# Patient Record
Sex: Male | Born: 1970 | Race: White | Hispanic: No | Smoking: Never smoker
Health system: Southern US, Community
[De-identification: ages and names within clinical notes are randomized; demographics above are authoritative.]

## PROBLEM LIST (undated history)

## (undated) DIAGNOSIS — M549 Dorsalgia, unspecified: Secondary | ICD-10-CM

## (undated) DIAGNOSIS — K4090 Unilateral inguinal hernia, without obstruction or gangrene, not specified as recurrent: Secondary | ICD-10-CM

## (undated) DIAGNOSIS — M779 Enthesopathy, unspecified: Secondary | ICD-10-CM

## (undated) DIAGNOSIS — K649 Unspecified hemorrhoids: Secondary | ICD-10-CM

## (undated) DIAGNOSIS — I10 Essential (primary) hypertension: Secondary | ICD-10-CM

## (undated) DIAGNOSIS — E785 Hyperlipidemia, unspecified: Secondary | ICD-10-CM

## (undated) DIAGNOSIS — Z87898 Personal history of other specified conditions: Secondary | ICD-10-CM

## (undated) DIAGNOSIS — I69398 Other sequelae of cerebral infarction: Secondary | ICD-10-CM

## (undated) DIAGNOSIS — M1990 Unspecified osteoarthritis, unspecified site: Secondary | ICD-10-CM

## (undated) DIAGNOSIS — K219 Gastro-esophageal reflux disease without esophagitis: Secondary | ICD-10-CM

## (undated) DIAGNOSIS — F09 Unspecified mental disorder due to known physiological condition: Secondary | ICD-10-CM

## (undated) DIAGNOSIS — G4700 Insomnia, unspecified: Secondary | ICD-10-CM

## (undated) DIAGNOSIS — G894 Chronic pain syndrome: Secondary | ICD-10-CM

## (undated) DIAGNOSIS — N4340 Spermatocele of epididymis, unspecified: Secondary | ICD-10-CM

## (undated) DIAGNOSIS — T7840XA Allergy, unspecified, initial encounter: Secondary | ICD-10-CM

## (undated) DIAGNOSIS — R252 Cramp and spasm: Secondary | ICD-10-CM

## (undated) DIAGNOSIS — I69320 Aphasia following cerebral infarction: Secondary | ICD-10-CM

## (undated) DIAGNOSIS — F410 Panic disorder [episodic paroxysmal anxiety] without agoraphobia: Secondary | ICD-10-CM

## (undated) DIAGNOSIS — N433 Hydrocele, unspecified: Secondary | ICD-10-CM

## (undated) DIAGNOSIS — M48062 Spinal stenosis, lumbar region with neurogenic claudication: Secondary | ICD-10-CM

## (undated) DIAGNOSIS — N529 Male erectile dysfunction, unspecified: Secondary | ICD-10-CM

## (undated) DIAGNOSIS — IMO0002 Reserved for concepts with insufficient information to code with codable children: Secondary | ICD-10-CM

## (undated) DIAGNOSIS — R69 Illness, unspecified: Secondary | ICD-10-CM

## (undated) DIAGNOSIS — F419 Anxiety disorder, unspecified: Secondary | ICD-10-CM

## (undated) DIAGNOSIS — I700 Atherosclerosis of aorta: Secondary | ICD-10-CM

## (undated) HISTORY — DX: Panic disorder (episodic paroxysmal anxiety): F41.0

## (undated) HISTORY — DX: Dorsalgia, unspecified: M54.9

## (undated) HISTORY — DX: Allergy, unspecified, initial encounter: T78.40XA

## (undated) HISTORY — DX: Illness, unspecified: R69

## (undated) HISTORY — PX: CYSTECTOMY: SUR359

## (undated) HISTORY — PX: HERNIA REPAIR: SHX51

## (undated) HISTORY — DX: Reserved for concepts with insufficient information to code with codable children: IMO0002

## (undated) HISTORY — DX: Enthesopathy, unspecified: M77.9

## (undated) HISTORY — DX: Unspecified osteoarthritis, unspecified site: M19.90

---

## 2004-03-18 ENCOUNTER — Emergency Department: Payer: Self-pay | Admitting: Emergency Medicine

## 2004-03-20 ENCOUNTER — Emergency Department: Payer: Self-pay | Admitting: Emergency Medicine

## 2005-03-01 ENCOUNTER — Ambulatory Visit: Payer: Self-pay | Admitting: Podiatry

## 2005-04-22 HISTORY — PX: TOE SURGERY: SHX1073

## 2008-09-01 ENCOUNTER — Emergency Department: Payer: Self-pay | Admitting: Emergency Medicine

## 2008-12-29 ENCOUNTER — Emergency Department: Payer: Self-pay | Admitting: Emergency Medicine

## 2010-06-04 ENCOUNTER — Emergency Department: Payer: Self-pay | Admitting: Emergency Medicine

## 2011-03-25 ENCOUNTER — Ambulatory Visit: Payer: Self-pay | Admitting: Family Medicine

## 2012-01-24 ENCOUNTER — Emergency Department (HOSPITAL_COMMUNITY): Payer: Self-pay

## 2012-01-24 ENCOUNTER — Emergency Department (HOSPITAL_COMMUNITY)
Admission: EM | Admit: 2012-01-24 | Discharge: 2012-01-24 | Disposition: A | Discharge status: Home / Self Care | Payer: Self-pay | Attending: Emergency Medicine | Admitting: Emergency Medicine

## 2012-01-24 DIAGNOSIS — Y998 Other external cause status: Secondary | ICD-10-CM | POA: Insufficient documentation

## 2012-01-24 DIAGNOSIS — Y93I9 Activity, other involving external motion: Secondary | ICD-10-CM | POA: Insufficient documentation

## 2012-01-24 DIAGNOSIS — S2249XA Multiple fractures of ribs, unspecified side, initial encounter for closed fracture: Secondary | ICD-10-CM | POA: Insufficient documentation

## 2012-01-24 MED ORDER — ACETAMINOPHEN 325 MG PO TABS
650.0000 mg | ORAL_TABLET | Freq: Once | ORAL | Status: AC
  Administered 2012-01-24: 650 mg via ORAL
  Filled 2012-01-24: qty 2

## 2012-01-24 MED ORDER — HYDROCODONE-ACETAMINOPHEN 5-325 MG PO TABS: 1.0000 | ORAL_TABLET | Freq: Four times a day (QID) | ORAL | Status: DC | PRN
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
## 2012-01-24 NOTE — ED Notes (Signed)
Incentive spirometer given
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
## 2012-01-24 NOTE — ED Notes (Signed)
Patient transported to X-ray 

## 2012-04-22 DIAGNOSIS — M549 Dorsalgia, unspecified: Secondary | ICD-10-CM

## 2012-04-22 DIAGNOSIS — IMO0002 Reserved for concepts with insufficient information to code with codable children: Secondary | ICD-10-CM

## 2012-04-22 HISTORY — DX: Reserved for concepts with insufficient information to code with codable children: IMO0002

## 2012-04-22 HISTORY — DX: Dorsalgia, unspecified: M54.9

## 2012-05-12 ENCOUNTER — Ambulatory Visit: Payer: Self-pay | Admitting: Family Medicine

## 2012-05-12 LAB — LIPID PANEL
Cholesterol: 233 mg/dL — AB (ref 0–200)
HDL: 37 mg/dL (ref 35–70)
LDL Cholesterol: 169 mg/dL
Triglycerides: 85 mg/dL (ref 40–160)

## 2012-05-12 LAB — TSH: TSH: 1.76 u[IU]/mL (ref <=5.90)

## 2012-07-06 ENCOUNTER — Other Ambulatory Visit: Payer: Self-pay | Admitting: Orthopedic Surgery

## 2012-07-15 NOTE — Progress Notes (Signed)
Sarah at dr Sheran Fava office is aware we have not talked to him yet-not returning calls

## 2012-07-16 ENCOUNTER — Ambulatory Visit (HOSPITAL_BASED_OUTPATIENT_CLINIC_OR_DEPARTMENT_OTHER): Admission: RE | Admit: 2012-07-16 | Payer: 59 | Source: Ambulatory Visit | Admitting: Orthopedic Surgery

## 2012-07-16 ENCOUNTER — Encounter (HOSPITAL_BASED_OUTPATIENT_CLINIC_OR_DEPARTMENT_OTHER): Payer: Self-pay | Admitting: Certified Registered Nurse Anesthetist

## 2012-07-16 ENCOUNTER — Encounter (HOSPITAL_BASED_OUTPATIENT_CLINIC_OR_DEPARTMENT_OTHER): Admission: RE | Payer: Self-pay | Source: Ambulatory Visit

## 2012-07-16 SURGERY — EXCISION METACARPAL MASS
Anesthesia: Choice | Site: Finger | Laterality: Right

## 2012-07-16 NOTE — Anesthesia Preprocedure Evaluation (Deleted)
Anesthesia Evaluation  Patient identified by MRN, date of birth, ID band Patient awake    Reviewed: Allergy & Precautions  Airway       Dental   Pulmonary          Cardiovascular     Neuro/Psych    GI/Hepatic   Endo/Other    Renal/GU      Musculoskeletal   Abdominal   Peds  Hematology   Anesthesia Other Findings   Reproductive/Obstetrics                           Anesthesia Physical Anesthesia Plan Anesthesia Quick Evaluation

## 2012-08-19 ENCOUNTER — Other Ambulatory Visit (HOSPITAL_COMMUNITY): Payer: Self-pay | Admitting: Rheumatology

## 2012-08-19 DIAGNOSIS — M549 Dorsalgia, unspecified: Secondary | ICD-10-CM

## 2012-08-24 ENCOUNTER — Ambulatory Visit (HOSPITAL_COMMUNITY): Payer: 59

## 2012-08-24 ENCOUNTER — Inpatient Hospital Stay (HOSPITAL_COMMUNITY): Admission: RE | Admit: 2012-08-24 | Payer: 59 | Source: Ambulatory Visit

## 2012-08-24 ENCOUNTER — Other Ambulatory Visit (HOSPITAL_COMMUNITY): Payer: 59

## 2012-08-24 ENCOUNTER — Ambulatory Visit (HOSPITAL_COMMUNITY)
Admission: RE | Admit: 2012-08-24 | Discharge: 2012-08-24 | Disposition: A | Discharge status: Home / Self Care | Payer: 59 | Source: Ambulatory Visit | Attending: Rheumatology | Admitting: Rheumatology

## 2012-08-24 ENCOUNTER — Other Ambulatory Visit (HOSPITAL_COMMUNITY): Payer: Self-pay | Admitting: Rheumatology

## 2012-08-24 DIAGNOSIS — M5124 Other intervertebral disc displacement, thoracic region: Secondary | ICD-10-CM | POA: Insufficient documentation

## 2012-08-24 DIAGNOSIS — M549 Dorsalgia, unspecified: Secondary | ICD-10-CM

## 2012-08-24 DIAGNOSIS — M5126 Other intervertebral disc displacement, lumbar region: Secondary | ICD-10-CM | POA: Insufficient documentation

## 2012-08-24 DIAGNOSIS — M51379 Other intervertebral disc degeneration, lumbosacral region without mention of lumbar back pain or lower extremity pain: Secondary | ICD-10-CM | POA: Insufficient documentation

## 2012-08-24 DIAGNOSIS — M5137 Other intervertebral disc degeneration, lumbosacral region: Secondary | ICD-10-CM | POA: Insufficient documentation

## 2012-09-10 ENCOUNTER — Other Ambulatory Visit (HOSPITAL_COMMUNITY): Payer: Self-pay | Admitting: Specialist

## 2012-09-10 DIAGNOSIS — M542 Cervicalgia: Secondary | ICD-10-CM

## 2012-09-10 DIAGNOSIS — M549 Dorsalgia, unspecified: Secondary | ICD-10-CM

## 2012-09-18 ENCOUNTER — Encounter (HOSPITAL_COMMUNITY)
Admission: RE | Admit: 2012-09-18 | Discharge: 2012-09-18 | Disposition: A | Discharge status: Home / Self Care | Payer: 59 | Source: Ambulatory Visit | Attending: Specialist | Admitting: Specialist

## 2012-09-18 DIAGNOSIS — M549 Dorsalgia, unspecified: Secondary | ICD-10-CM

## 2012-09-18 DIAGNOSIS — M542 Cervicalgia: Secondary | ICD-10-CM | POA: Insufficient documentation

## 2012-09-18 MED ORDER — TECHNETIUM TC 99M MEDRONATE IV KIT
25.2000 | PACK | Freq: Once | INTRAVENOUS | Status: AC | PRN
  Administered 2012-09-18: 25.2 via INTRAVENOUS

## 2012-10-14 ENCOUNTER — Encounter: Payer: Self-pay | Admitting: General Surgery

## 2012-10-14 ENCOUNTER — Ambulatory Visit (INDEPENDENT_AMBULATORY_CARE_PROVIDER_SITE_OTHER): Payer: 59 | Admitting: General Surgery

## 2012-10-14 VITALS — BP 110/72 | HR 66 | Resp 14 | Ht 73.0 in | Wt 193.0 lb

## 2012-10-14 DIAGNOSIS — K4090 Unilateral inguinal hernia, without obstruction or gangrene, not specified as recurrent: Secondary | ICD-10-CM

## 2012-10-14 NOTE — Patient Instructions (Addendum)
Hernia precautions and incarceration were discussed with the patient. If they develop symptoms of an incarcerated hernia, they were encouraged to seek prompt medical attention.  I have recommended repair of the hernia using mesh on an outpatient basis in the near future. The risk of infection was reviewed. The role of prosthetic mesh to minimize the risk of recurrence was reviewed.   Patient's surgery has been scheduled for 11-06-12 at [facility].

## 2012-10-14 NOTE — Progress Notes (Signed)
Patient ID: David Reeves, male   DOB: 01-29-1971, 42 y.o.   MRN: [id number]  Chief Complaint  Patient presents with  . Other    evaluate hernia    HPI David Reeves is a 42 y.o. male.  Patient here today for evaluation of right inguinal hernia referred by Dr Shella Spearing. Noticed this "buldging area" about a month ago. Wearing a back belt brace for about 2 weeks. States only 2 episodes of pain in the right inguinal area. Mild constipation from narcotics using stool softeners.  HPI  Past Medical History  Diagnosis Date  . Back pain 2014  . Bulging disc 2014  . Osteoarthritis   . Degenerative disc disease   . Bone spur     Past Surgical History  Procedure Laterality Date  . Toe surgery Right 2007  . Hernia repair Left 2006    [facility]    History reviewed. No pertinent family history.  Social History History  Substance Use Topics  . Smoking status: Never Smoker   . Smokeless tobacco: Not on file  . Alcohol Use: No    Allergies  Allergen Reactions  . Penicillins Other (See Comments)    Unknown -- childhood reaction    Current Outpatient Prescriptions  Medication Sig Dispense Refill  . docusate sodium (COLACE) 100 MG capsule Take 100 mg by mouth 2 (two) times daily.      . DULoxetine (CYMBALTA) 20 MG capsule Take 20 mg by mouth daily.      Marland Kitchen HYDROcodone-acetaminophen (NORCO) 10-325 MG per tablet Take 1 tablet by mouth every 6 (six) hours as needed for pain.      Marland Kitchen ibuprofen (ADVIL,MOTRIN) 200 MG tablet Take 800 mg by mouth every 6 (six) hours as needed. For pain      . tiZANidine (ZANAFLEX) 4 MG capsule Take 4 mg by mouth 3 (three) times daily.       No current facility-administered medications for this visit.    Review of Systems Review of Systems  Constitutional: Positive for unexpected weight change.  Respiratory: Negative.   Cardiovascular: Negative.   Psychiatric/Behavioral: Positive for sleep disturbance.    Blood pressure 110/72, pulse 66, resp. rate  14, height 6\' 1"  (1.854 m), weight 193 lb (87.544 kg).  Physical Exam Physical Exam  Constitutional: He is oriented to person, place, and time. He appears well-developed and well-nourished.  Eyes: Conjunctivae are normal.  Cardiovascular: Normal rate and regular rhythm.   Pulmonary/Chest: Effort normal and breath sounds normal.  Abdominal: Soft. A hernia is present. Hernia confirmed positive in the right inguinal area.  Lymphadenopathy:    He has no cervical adenopathy.  Neurological: He is alert and oriented to person, place, and time.  Skin: Skin is warm and dry.  right inguinal hernia small and reducible   Data Reviewed none  Assessment    Right inguinal hernia, symptomatic    Plan    Discussed operative repair in full.  Agrees to open operation with mesh.    Patient's surgery has been scheduled for 11-06-12 at [facility].   SANKAR,SEEPLAPUTHUR G 10/16/2012, 5:04 PM

## 2012-10-16 ENCOUNTER — Encounter: Payer: Self-pay | Admitting: General Surgery

## 2012-10-21 ENCOUNTER — Other Ambulatory Visit: Payer: Self-pay | Admitting: General Surgery

## 2012-10-21 DIAGNOSIS — K4090 Unilateral inguinal hernia, without obstruction or gangrene, not specified as recurrent: Secondary | ICD-10-CM

## 2012-11-06 ENCOUNTER — Ambulatory Visit: Payer: Self-pay | Admitting: General Surgery

## 2012-11-06 ENCOUNTER — Encounter: Payer: Self-pay | Admitting: General Surgery

## 2012-11-06 DIAGNOSIS — K4090 Unilateral inguinal hernia, without obstruction or gangrene, not specified as recurrent: Secondary | ICD-10-CM

## 2012-11-11 ENCOUNTER — Emergency Department: Payer: Self-pay | Admitting: Internal Medicine

## 2012-11-24 ENCOUNTER — Ambulatory Visit (INDEPENDENT_AMBULATORY_CARE_PROVIDER_SITE_OTHER): Payer: 59 | Admitting: General Surgery

## 2012-11-24 ENCOUNTER — Encounter: Payer: Self-pay | Admitting: General Surgery

## 2012-11-24 VITALS — BP 140/90 | HR 88 | Resp 16 | Ht 73.0 in | Wt 205.0 lb

## 2012-11-24 DIAGNOSIS — K4090 Unilateral inguinal hernia, without obstruction or gangrene, not specified as recurrent: Secondary | ICD-10-CM

## 2012-11-24 NOTE — Progress Notes (Signed)
Patient is here today for his post op right inguinal hernia repair done on 11/06/12.Patient states he is doing well. Right groin incision is clean and healing well.  Repair is intact.

## 2012-11-24 NOTE — Patient Instructions (Addendum)
Patient to return in one month. May increase activity slowly back to normal. 

## 2012-12-14 ENCOUNTER — Ambulatory Visit: Payer: Self-pay | Admitting: Pain Medicine

## 2012-12-25 ENCOUNTER — Other Ambulatory Visit (HOSPITAL_COMMUNITY): Payer: Self-pay | Admitting: Specialist

## 2012-12-25 DIAGNOSIS — I714 Abdominal aortic aneurysm, without rupture, unspecified: Secondary | ICD-10-CM

## 2012-12-28 ENCOUNTER — Ambulatory Visit (HOSPITAL_COMMUNITY): Payer: 59

## 2012-12-29 ENCOUNTER — Ambulatory Visit: Payer: 59 | Admitting: General Surgery

## 2013-02-25 ENCOUNTER — Other Ambulatory Visit: Payer: Self-pay

## 2013-11-14 ENCOUNTER — Emergency Department: Payer: Self-pay | Admitting: Emergency Medicine

## 2013-11-14 LAB — COMPREHENSIVE METABOLIC PANEL
Albumin: 4.2 g/dL (ref 3.4–5.0)
Alkaline Phosphatase: 64 U/L
Anion Gap: 8 (ref 7–16)
BUN: 12 mg/dL (ref 7–18)
Bilirubin,Total: 1.1 mg/dL — ABNORMAL HIGH (ref 0.2–1.0)
Calcium, Total: 9 mg/dL (ref 8.5–10.1)
Chloride: 107 mmol/L (ref 98–107)
Co2: 26 mmol/L (ref 21–32)
Creatinine: 0.85 mg/dL (ref 0.60–1.30)
EGFR (African American): >60
EGFR (Non-African Amer.): >60
Glucose: 116 mg/dL — ABNORMAL HIGH (ref 65–99)
Osmolality: 282 (ref 275–301)
Potassium: 4 mmol/L (ref 3.5–5.1)
SGOT(AST): 20 U/L (ref 15–37)
SGPT (ALT): 35 U/L
Sodium: 141 mmol/L (ref 136–145)
Total Protein: 8.1 g/dL (ref 6.4–8.2)

## 2013-11-14 LAB — CBC
HCT: 46.6 % (ref 40.0–52.0)
HGB: 16.1 g/dL (ref 13.0–18.0)
MCH: 33 pg (ref 26.0–34.0)
MCHC: 34.5 g/dL (ref 32.0–36.0)
MCV: 96 fL (ref 80–100)
Platelet: 329 10*3/uL (ref 150–440)
RBC: 4.86 10*6/uL (ref 4.40–5.90)
RDW: 13 % (ref 11.5–14.5)
WBC: 8.2 10*3/uL (ref 3.8–10.6)

## 2013-11-14 LAB — ETHANOL
Ethanol %: <0.003 % (ref 0.000–0.080)
Ethanol: <3 mg/dL

## 2013-11-14 LAB — URINALYSIS, COMPLETE
Bacteria: NONE SEEN
Bilirubin,UR: NEGATIVE
Blood: NEGATIVE
Glucose,UR: NEGATIVE mg/dL (ref 0–75)
Nitrite: NEGATIVE
Ph: 5 (ref 4.5–8.0)
Protein: NEGATIVE
RBC,UR: 2 /HPF (ref 0–5)
Specific Gravity: 1.026 (ref 1.003–1.030)
Squamous Epithelial: NONE SEEN
WBC UR: 5 /HPF (ref 0–5)

## 2013-11-14 LAB — DRUG SCREEN, URINE
Amphetamines, Ur Screen: NEGATIVE (ref ?–1000)
Barbiturates, Ur Screen: NEGATIVE (ref ?–200)
Benzodiazepine, Ur Scrn: POSITIVE (ref ?–200)
Cannabinoid 50 Ng, Ur ~~LOC~~: POSITIVE (ref ?–50)
Cocaine Metabolite,Ur ~~LOC~~: NEGATIVE (ref ?–300)
MDMA (Ecstasy)Ur Screen: NEGATIVE (ref ?–500)
Methadone, Ur Screen: NEGATIVE (ref ?–300)
Opiate, Ur Screen: POSITIVE (ref ?–300)
Phencyclidine (PCP) Ur S: NEGATIVE (ref ?–25)
Tricyclic, Ur Screen: NEGATIVE (ref ?–1000)

## 2013-11-14 LAB — ACETAMINOPHEN LEVEL: Acetaminophen: <2 ug/mL

## 2013-11-14 LAB — SALICYLATE LEVEL: Salicylates, Serum: <1.7 mg/dL

## 2014-03-24 LAB — BASIC METABOLIC PANEL
BUN: 13 mg/dL (ref 4–21)
Creatinine: 0.9 mg/dL (ref <=1.3)
Glucose: 95 mg/dL
Potassium: 4.3 mmol/L (ref 3.4–5.3)
Sodium: 141 mmol/L (ref 137–147)

## 2014-03-24 LAB — CBC AND DIFFERENTIAL
HCT: 50 % (ref 41–53)
Hemoglobin: 16.9 g/dL (ref 13.5–17.5)
Neutrophils Absolute: 56 /uL
Platelets: 363 10*3/uL (ref 150–399)
WBC: 8.7 10^3/mL

## 2014-03-24 LAB — HEPATIC FUNCTION PANEL
ALT: 26 U/L (ref 10–40)
AST: 15 U/L (ref 14–40)
Alkaline Phosphatase: 67 U/L (ref 25–125)
Bilirubin, Total: 0.6 mg/dL

## 2014-08-12 NOTE — Op Note (Signed)
PATIENT NAME:  David Reeves, David Reeves MR#:  [id number] DATE OF BIRTH:  1971/01/26  DATE OF PROCEDURE:  11/06/2012  PREOPERATIVE DIAGNOSIS: Right inguinal hernia.   POSTOPERATIVE DIAGNOSIS: Right inguinal hernia, indirect and lipoma of the cord.   OPERATION: Repair right inguinal hernia with Parietex mesh.   SURGEON: Next G. Evette CristalSankar, M.D.   ANESTHESIA: General.   COMPLICATIONS: None.   ESTIMATED BLOOD LOSS: Minimal.   DRAINS: None.   DESCRIPTION OF PROCEDURE: The patient was put to sleep with an LMA. The right groin area was prepped and draped out as a sterile field. Timeout procedure was performed. 0.5% Marcaine was instilled along the inguinal canal area on the right for postoperative analgesia. Incision was made along the medial two thirds of the inguinal canal and deepened through the layers down to the external oblique. Bleeding controlled with cautery and ligatures of 3-0 Vicryl. The external oblique was then exposed and opened along the line of its fibers through the external ring. Exploration of the spermatic cord and the inguinal canal revealed that the patient had a lipoma of the cord which was then freed all the way down towards the internal ring area, ligated with 3-0 Vicryl and cut. An indirect sac somewhat thickened and scarred was identified which was then freed very carefully with the vas deferens being somewhat adherent to it in places and was peeled away by blunt dissection until the sac was freed down to the neck area. It was then opened and showed no contents were in there.  It was suture ligated at the base with 2-0 Vicryl and amputated. The ilioinguinal nerve was identified and was preserved during the procedure. Following this, the posterior wall was adequately exposed. A Parietex mesh was brought up and the lower edge was trimmed. It was then placed around the internal ring and laid down across the posterior wall. It was secured to the pubic tubercle with a 2-0 PDS stitch.  Satisfactory reinforcement was noted. The wound was irrigated and closed. External oblique closed with a running 2-0 PDS. The subcutaneous tissue was closed with 3-0 Vicryl, skin closed with subcuticular 4-0 Vicryl, covered with Dermabond. The procedure was well tolerated. He was subsequently returned to the recovery room in stable condition    ____________________________ S.Wynona LunaG. Sankar, MD sgs:dp D: 11/06/2012 08:59:55 ET T: 11/06/2012 09:10:20 ET JOB#: [id number]  cc: Timoteo ExposeS.G. Evette CristalSankar, MD, <Dictator> [facility] CenterEEPLAPUTH Wynona LunaG SANKAR MD ELECTRONICALLY SIGNED 11/08/2012 10:23

## 2014-08-17 DIAGNOSIS — J45909 Unspecified asthma, uncomplicated: Secondary | ICD-10-CM | POA: Insufficient documentation

## 2014-08-17 DIAGNOSIS — R000 Tachycardia, unspecified: Secondary | ICD-10-CM | POA: Insufficient documentation

## 2014-08-17 DIAGNOSIS — M6790 Unspecified disorder of synovium and tendon, unspecified site: Secondary | ICD-10-CM | POA: Insufficient documentation

## 2014-08-17 DIAGNOSIS — M545 Low back pain, unspecified: Secondary | ICD-10-CM | POA: Insufficient documentation

## 2014-08-17 DIAGNOSIS — M47817 Spondylosis without myelopathy or radiculopathy, lumbosacral region: Secondary | ICD-10-CM | POA: Insufficient documentation

## 2014-08-17 DIAGNOSIS — R69 Illness, unspecified: Secondary | ICD-10-CM | POA: Insufficient documentation

## 2014-08-17 DIAGNOSIS — K4090 Unilateral inguinal hernia, without obstruction or gangrene, not specified as recurrent: Secondary | ICD-10-CM | POA: Insufficient documentation

## 2014-08-17 DIAGNOSIS — F410 Panic disorder [episodic paroxysmal anxiety] without agoraphobia: Secondary | ICD-10-CM | POA: Insufficient documentation

## 2014-08-17 DIAGNOSIS — M549 Dorsalgia, unspecified: Secondary | ICD-10-CM | POA: Insufficient documentation

## 2014-08-17 DIAGNOSIS — IMO0001 Reserved for inherently not codable concepts without codable children: Secondary | ICD-10-CM | POA: Insufficient documentation

## 2014-08-17 DIAGNOSIS — F064 Anxiety disorder due to known physiological condition: Secondary | ICD-10-CM | POA: Insufficient documentation

## 2014-08-17 DIAGNOSIS — N509 Disorder of male genital organs, unspecified: Secondary | ICD-10-CM | POA: Insufficient documentation

## 2014-09-22 ENCOUNTER — Ambulatory Visit (INDEPENDENT_AMBULATORY_CARE_PROVIDER_SITE_OTHER): Payer: 59 | Admitting: Family Medicine

## 2014-09-22 ENCOUNTER — Encounter: Payer: Self-pay | Admitting: Family Medicine

## 2014-09-22 ENCOUNTER — Other Ambulatory Visit: Payer: Self-pay

## 2014-09-22 VITALS — BP 124/82 | HR 80 | Temp 97.9°F | Resp 16 | Wt 212.2 lb

## 2014-09-22 DIAGNOSIS — G8929 Other chronic pain: Secondary | ICD-10-CM

## 2014-09-22 DIAGNOSIS — M5410 Radiculopathy, site unspecified: Secondary | ICD-10-CM

## 2014-09-22 DIAGNOSIS — F410 Panic disorder [episodic paroxysmal anxiety] without agoraphobia: Secondary | ICD-10-CM

## 2014-09-22 DIAGNOSIS — G894 Chronic pain syndrome: Secondary | ICD-10-CM | POA: Insufficient documentation

## 2014-09-22 DIAGNOSIS — M5416 Radiculopathy, lumbar region: Secondary | ICD-10-CM

## 2014-09-22 DIAGNOSIS — R69 Illness, unspecified: Secondary | ICD-10-CM | POA: Diagnosis not present

## 2014-09-22 DIAGNOSIS — J069 Acute upper respiratory infection, unspecified: Secondary | ICD-10-CM

## 2014-09-22 DIAGNOSIS — I714 Abdominal aortic aneurysm, without rupture, unspecified: Secondary | ICD-10-CM | POA: Insufficient documentation

## 2014-09-22 NOTE — Progress Notes (Signed)
Subjective:    Patient ID: David Reeves, male    DOB: 1971-01-09, 44 y.o.   MRN: [id number]  HPI  Chief Complaint  Patient presents with  . Follow-up    Anxiety and panic attacks controlled with use of Clonazepam q 8 hrs. Sleeping well with use of this medication and controlling chronic back pain.   Marland Kitchen URI    Developed cough with rhinorrhea, pressure behind eyes, post nasal drip, scratchy throat and greenish to yellow nasal mucus and sputum production. Tried Alka-Seltzer, Robitussin and Zyrtec with relief of symptoms. No fever but some chills with sweats initially.   -   Chronic low back pain  Recurrent/persistent low back pain with radiation into legs stable and controlled with use of Vicodin 7.5-325 mg TID, Cyclobenzaprine 10 mg BID prn spasms and Celebrex 200 mg BID. Pain usually 6/10 but can be brought down to 1-2/10 with medications. Sleeping 6-7 hours a night. Tries to walk or fish from a canoe for exercise. Has to use a back brace to sit in the canoe. Stops ever 30 minutes to stand and walk a bit before getting back in the canoe again. Always has assistance with family or fishing friends.       Past Medical History  Diagnosis Date  . Back pain 2014  . Bulging disc 2014  . Osteoarthritis   . Degenerative disc disease   . Bone spur   . Panic anxiety syndrome   . Taking multiple medications for chronic disease       Current Outpatient Prescriptions on File Prior to Visit  Medication Sig Dispense Refill  . celecoxib (CELEBREX) 200 MG capsule Take 1 capsule by mouth 2 (two) times daily.    . clonazePAM (KLONOPIN) 1 MG tablet Take 1 tablet by mouth every 8 (eight) hours.    . cyclobenzaprine (FLEXERIL) 10 MG tablet Take 1 tablet by mouth at bedtime. May take a extra dose in the morning if needed.    Marland Kitchen HYDROcodone-acetaminophen (NORCO) 7.5-325 MG per tablet Take 1 tablet by mouth 3 (three) times daily as needed.     No current facility-administered medications on file  prior to visit.     Review of Systems  Constitutional: Negative for fever and chills.  HENT: Positive for congestion, postnasal drip, rhinorrhea and sore throat. Negative for ear pain.   Respiratory: Positive for cough. Negative for chest tightness and shortness of breath.   Cardiovascular: Negative for chest pain.  Gastrointestinal: Negative for nausea, vomiting, diarrhea and constipation.  Genitourinary: Negative.   Musculoskeletal: Positive for back pain.  Psychiatric/Behavioral: The patient is nervous/anxious.        Allergies  Allergen Reactions  . Duloxetine Hcl   . Penicillins Other (See Comments)    Unknown -- childhood reaction    Filed Vitals:   09/22/14 1004  BP: 124/82  Pulse: 80  Temp: 97.9 F (36.6 C)  Resp: 16    Objective:   Physical Exam  Constitutional: He is oriented to person, place, and time. He appears well-developed and well-nourished.  HENT:  Head: Normocephalic.  Right Ear: External ear normal.  Left Ear: External ear normal.  Nose: Rhinorrhea present. No sinus tenderness. No epistaxis. Right sinus exhibits no maxillary sinus tenderness and no frontal sinus tenderness. Left sinus exhibits no maxillary sinus tenderness and no frontal sinus tenderness.  Eyes: Conjunctivae, EOM and lids are normal. Pupils are equal, round, and reactive to light.  Neck: No thyroid mass present.  Cardiovascular: Normal rate,  regular rhythm and normal heart sounds.   Pulmonary/Chest: Effort normal and breath sounds normal.  Musculoskeletal:       Lumbar back: He exhibits decreased range of motion and pain.       Back:  Neurological: He is alert and oriented to person, place, and time.  Reflex Scores:      Achilles reflexes are 1+ on the right side and 2+ on the left side. SLR's causing pain in back radiating down right leg at 80 degrees.  Psychiatric: His mood appears anxious.  Panic intermittent with return of anxiety prior to next dose of Clonazepam. Sleeping  6-7 hours a night without hangover.   Family History  Problem Relation Age of Onset  . Diabetes Mother   . Diabetes Maternal Uncle   . Diabetes Maternal Grandmother   . Heart disease Maternal Grandmother    History  Substance Use Topics  . Smoking status: Never Smoker   . Smokeless tobacco: Never Used  . Alcohol Use: No          Assessment & Plan:  1. Upper respiratory infection Recent onset without fever. Suspect viral illness. Will check CBC with diff to rule out bacterial infection. May continue present OTC medication for control of symptoms. Increase fluids and recheck pending reports. - CBC w/Diff  2. Panic anxiety syndrome Controlled and stable with use of Clonazepam q 8 hours. Tolerating medication without side effects with check CMP. Continue present dosage. - Comp Met (CMET)  3. Taking medication for chronic disease Stable without escalation of dosage of routine pain management medications. Denies hangover sensation or feelings of being over medicated. Recheck pending labs. - Comp Met (CMET)  4. Chronic radicular pain of lower back Stable and pain controlled by NSAID, analgesic and muscle relaxant. Continue exercise, back brace prn and most heat applications. Recheck in 3 months.

## 2014-09-23 ENCOUNTER — Other Ambulatory Visit: Payer: Self-pay | Admitting: Family Medicine

## 2014-09-24 LAB — CBC WITH DIFFERENTIAL/PLATELET
Basophils Absolute: 0 10*3/uL (ref 0.0–0.2)
Basos: 1 %
EOS (ABSOLUTE): 0.1 10*3/uL (ref 0.0–0.4)
Eos: 2 %
Hematocrit: 45.6 % (ref 37.5–51.0)
Hemoglobin: 15.4 g/dL (ref 12.6–17.7)
Immature Grans (Abs): 0 10*3/uL (ref 0.0–0.1)
Immature Granulocytes: 0 %
Lymphocytes Absolute: 1.9 10*3/uL (ref 0.7–3.1)
Lymphs: 31 %
MCH: 32.4 pg (ref 26.6–33.0)
MCHC: 33.8 g/dL (ref 31.5–35.7)
MCV: 96 fL (ref 79–97)
Monocytes Absolute: 0.6 10*3/uL (ref 0.1–0.9)
Monocytes: 9 %
Neutrophils Absolute: 3.5 10*3/uL (ref 1.4–7.0)
Neutrophils: 57 %
Platelets: 362 10*3/uL (ref 150–379)
RBC: 4.76 x10E6/uL (ref 4.14–5.80)
RDW: 13.1 % (ref 12.3–15.4)
WBC: 6.2 10*3/uL (ref 3.4–10.8)

## 2014-09-24 LAB — COMPREHENSIVE METABOLIC PANEL
ALT: 20 IU/L (ref 0–44)
AST: 16 IU/L (ref 0–40)
Albumin/Globulin Ratio: 1.7 (ref 1.1–2.5)
Albumin: 4.2 g/dL (ref 3.5–5.5)
Alkaline Phosphatase: 63 IU/L (ref 39–117)
BUN/Creatinine Ratio: 13 (ref 9–20)
BUN: 11 mg/dL (ref 6–24)
Bilirubin Total: 0.4 mg/dL (ref 0.0–1.2)
CO2: 24 mmol/L (ref 18–29)
Calcium: 9.4 mg/dL (ref 8.7–10.2)
Chloride: 102 mmol/L (ref 97–108)
Creatinine, Ser: 0.84 mg/dL (ref 0.76–1.27)
GFR calc Af Amer: 123 mL/min/{1.73_m2} (ref >59)
GFR calc non Af Amer: 106 mL/min/{1.73_m2} (ref >59)
Globulin, Total: 2.5 g/dL (ref 1.5–4.5)
Glucose: 108 mg/dL — ABNORMAL HIGH (ref 65–99)
Potassium: 5.1 mmol/L (ref 3.5–5.2)
Sodium: 137 mmol/L (ref 134–144)
Total Protein: 6.7 g/dL (ref 6.0–8.5)

## 2014-09-26 ENCOUNTER — Telehealth: Payer: Self-pay | Admitting: Family Medicine

## 2014-09-26 NOTE — Telephone Encounter (Signed)
Pt contacted office for refill request on the following medications: Hydrocodone 7.5-325 mg and would like to pick this up Thursday 09/29/14 because he will run out then. Pt also stated that due to his cough he has a hemorrhoid and has been treating over the counter but it hasn't helped. Pt wanted to know what else he could try for the hemorrhoid or does he need to come in for OV.  Thanks TNP

## 2014-09-27 MED ORDER — HYDROCODONE-ACETAMINOPHEN 7.5-325 MG PO TABS: 1.0000 | ORAL_TABLET | Freq: Three times a day (TID) | ORAL | Status: DC | PRN

## 2014-09-27 NOTE — Telephone Encounter (Signed)
Advise patient to use hot epsom saltwater soaks and Anusol suppositories BID for hemorrhoids. If no better in 3-4 days (or worsening), should examine in the office. Refill of Hydrocodone will be at the front desk for pick up.

## 2014-09-27 NOTE — Telephone Encounter (Signed)
Advised patient that prescription was ready for pick up. Also advised of hot water epsom salt soaks. Patient will call if not improving.

## 2014-09-29 ENCOUNTER — Ambulatory Visit: Payer: Self-pay | Admitting: Family Medicine

## 2014-10-17 ENCOUNTER — Other Ambulatory Visit: Payer: Self-pay | Admitting: Family Medicine

## 2014-10-17 MED ORDER — HYDROCODONE-ACETAMINOPHEN 7.5-325 MG PO TABS: 1.0000 | ORAL_TABLET | Freq: Three times a day (TID) | ORAL | Status: DC | PRN

## 2014-10-17 NOTE — Telephone Encounter (Signed)
Pt contacted office for refill request on the following medications: Hydrocodone 7.5-325 mg Clonazepam 1 mg to [facility] Pt stated that he has 2 days left of the Hydrocodone and 3 weeks left of the Clonazepam but he wanted to go ahead and request the Clonazepam because the mail order takes so long. Thanks TNP

## 2014-10-17 NOTE — Telephone Encounter (Signed)
Advise patient prescription ready for pick up for the Hydrocodone. Have sent paper fax request from [facility] for the Clonazepam back to them.

## 2014-10-18 NOTE — Telephone Encounter (Signed)
Patient advised RX is at the front desk ready for pick up.  

## 2014-10-19 ENCOUNTER — Telehealth: Payer: Self-pay

## 2014-10-19 NOTE — Telephone Encounter (Signed)
[facility]on[facility]am [facility] mg.

## 2014-10-20 NOTE — Telephone Encounter (Signed)
Advise patient the form was faxed back to [facility] for the Clonazepam on 10-18-14.

## 2014-11-07 ENCOUNTER — Telehealth: Payer: Self-pay | Admitting: Family Medicine

## 2014-11-07 NOTE — Telephone Encounter (Signed)
HYDROcodone-acetaminophen (NORCO) 7.5-325 MG per tablet Needs to pick up RX.  Thanks, TP

## 2014-11-08 ENCOUNTER — Ambulatory Visit (INDEPENDENT_AMBULATORY_CARE_PROVIDER_SITE_OTHER): Payer: 59 | Admitting: Family Medicine

## 2014-11-08 ENCOUNTER — Encounter: Payer: Self-pay | Admitting: Family Medicine

## 2014-11-08 DIAGNOSIS — Z87898 Personal history of other specified conditions: Secondary | ICD-10-CM

## 2014-11-08 DIAGNOSIS — M545 Low back pain, unspecified: Secondary | ICD-10-CM

## 2014-11-08 DIAGNOSIS — G8929 Other chronic pain: Secondary | ICD-10-CM | POA: Insufficient documentation

## 2014-11-08 DIAGNOSIS — M5441 Lumbago with sciatica, right side: Secondary | ICD-10-CM | POA: Insufficient documentation

## 2014-11-08 DIAGNOSIS — F410 Panic disorder [episodic paroxysmal anxiety] without agoraphobia: Secondary | ICD-10-CM | POA: Diagnosis not present

## 2014-11-08 DIAGNOSIS — Z8719 Personal history of other diseases of the digestive system: Secondary | ICD-10-CM

## 2014-11-08 MED ORDER — HYDROCODONE-ACETAMINOPHEN 7.5-325 MG PO TABS: 1.0000 | ORAL_TABLET | Freq: Three times a day (TID) | ORAL | Status: DC | PRN

## 2014-11-08 NOTE — Progress Notes (Signed)
Subjective:    Patient ID: David Reeves, male    DOB: February 02, 1971, 44 y.o.   MRN: [id number] Chief Complaint  Patient presents with  . Hemorrhoids    2 - 3 weeks ago, have been having some bleeeding.  . Back Pain    having some back pain more today.     HPI  This 44 year old male with history of degenerative disc disease, osteoarthritis and bulging disc with anxiety disorder has had flare of hemorrhoid irritation 2-3 weeks ago. Treated with OTC Preparation-H and hot soaks. No bleeding now and swelling gone. No constipation or diarrhea. Using chronic opiate and muscle relaxant therapy for chronic back pain and has to use stool softeners to control constipation. Mother died unexpectedly on Nov 14, 2014 and had to drive 2-3 hours back from a vacation in [place]. The long drive over a 2-3 day period has caused some flare of low back pain. Having to wear back brace at least 1/2 a day now. Sitting on a heating pad frequently with extra relief. Still walking on treadmill some for exercise. Still very slow to ambulate and using reach extenders to pick up items on the floor to prevent bending over. Quick movements can cause sharp pains in lower back "for hours". Still wanting to stay away from any back surgery if possible.  Past Medical History  Diagnosis Date  . Back pain 2014  . Bulging disc 2014  . Osteoarthritis   . Degenerative disc disease   . Bone spur   . Panic anxiety syndrome   . Taking multiple medications for chronic disease    Past Surgical History  Procedure Laterality Date  . Toe surgery Right 2007  . Hernia repair Left 2006,2014    [facility]   History  Substance Use Topics  . Smoking status: Never Smoker   . Smokeless tobacco: Never Used  . Alcohol Use: No   Family History  Problem Relation Age of Onset  . Diabetes Mother   . Diabetes Maternal Uncle   . Diabetes Maternal Grandmother   . Heart disease Maternal Grandmother    Current Outpatient Prescriptions on File Prior  to Visit  Medication Sig Dispense Refill  . celecoxib (CELEBREX) 200 MG capsule Take 1 capsule by mouth 2 (two) times daily.    . clonazePAM (KLONOPIN) 1 MG tablet Take 1 tablet by mouth every 8 (eight) hours.    . cyclobenzaprine (FLEXERIL) 10 MG tablet Take 1 tablet by mouth at bedtime. May take a extra dose in the morning if needed.    Marland Kitchen HYDROcodone-acetaminophen (NORCO) 7.5-325 MG per tablet Take 1 tablet by mouth 3 (three) times daily as needed. 60 tablet 0   No current facility-administered medications on file prior to visit.   Allergies  Allergen Reactions  . Duloxetine Hcl   . Penicillins Other (See Comments)    Unknown -- childhood reaction   Review of Systems  Constitutional: Positive for appetite change.  HENT: Negative.   Eyes: Negative.   Respiratory: Negative.   Cardiovascular: Negative.   Gastrointestinal: Negative.   Genitourinary: Negative.   Musculoskeletal: Positive for back pain.  Neurological: Negative.   Psychiatric/Behavioral: Positive for dysphoric mood. The patient is nervous/anxious.       BP 120/86 mmHg  Pulse 110  Temp(Src) 99.9 F (37.7 C) (Oral)  Resp 16  Wt 207 lb 6.4 oz (94.076 kg)  Objective:   Physical Exam  Constitutional: He is oriented to person, place, and time. He appears well-developed and  well-nourished.  HENT:  Head: Normocephalic and atraumatic.  Right Ear: External ear normal.  Left Ear: External ear normal.  Eyes: Conjunctivae and EOM are normal. Pupils are equal, round, and reactive to light.  Neck: Normal range of motion. Neck supple.  Cardiovascular: Normal rate, regular rhythm, normal heart sounds and intact distal pulses.   Pulmonary/Chest: Effort normal and breath sounds normal.  Abdominal: Bowel sounds are normal.  Genitourinary: Rectal exam shows no external hemorrhoid, no mass and no tenderness.  Musculoskeletal: He exhibits tenderness.  Tender lower lumbar region over dorsal spine and paravertebral musculature.  Skin mottled in that area from chronic heating pad use. Stiffness and increased pain to attempt flexion in any direction. SLR's 80 degrees with increase in discomfort but very little radiation into legs. Good lower extremity strength.  Neurological: He is alert and oriented to person, place, and time.  Skin: Skin is warm.  Dark mottling of lumbar skin from chronic heating pad use.  Psychiatric: His speech is normal. Judgment and thought content normal. His mood appears anxious. His affect is labile. He is slowed. Cognition and memory are normal.  Crying spontaneously when talking about mother's death - bereavement.      Assessment & Plan:  1. Hx of hemorrhoids Intermittent flare of hemorrhoid irritation. No bleeding at the present. Preparation-H seems to help control symptoms with hot sitz baths. Should continue stool softener and or laxative prn constipation. Recheck prn.  2. Chronic low back pain Persistent chronic low back pain with some spasms when attempting exercise. Good control of pain with Norco and muscle relaxant prn. Panic/anxiety attacks seem to cause augmentation of pain level. Use of back brace helping to allow more activity and limit use of Norco. Refilled medication and recheck in 3 months. - HYDROcodone-acetaminophen (NORCO) 7.5-325 MG per tablet; Take 1 tablet by mouth 3 (three) times daily as needed.  Dispense: 60 tablet; Refill: 0  3. Panic attack Feels more anxiety and sadness/depression since mother died 10-22-14 unexpectedly. Clonazepam 1 mg 2-3 times a day controls panic/anxiety and helps control muscle spasms in his back.  4. Episodic paroxysmal anxiety disorder Stable and controled with Clonazepam 1 mg 2-3 times a day. Well tolerated without side effects.

## 2014-11-15 ENCOUNTER — Other Ambulatory Visit: Payer: Self-pay | Admitting: Family Medicine

## 2014-11-24 ENCOUNTER — Telehealth: Payer: Self-pay | Admitting: Family Medicine

## 2014-11-24 DIAGNOSIS — G8929 Other chronic pain: Secondary | ICD-10-CM

## 2014-11-24 DIAGNOSIS — M545 Low back pain, unspecified: Secondary | ICD-10-CM

## 2014-11-24 MED ORDER — HYDROCODONE-ACETAMINOPHEN 7.5-325 MG PO TABS: 1.0000 | ORAL_TABLET | Freq: Three times a day (TID) | ORAL | Status: DC | PRN

## 2014-11-24 NOTE — Telephone Encounter (Signed)
HYDROcodone-acetaminophen (NORCO) 7.5-325 MG per tablet   Pt runs out Sunday.  Thanks Barth Kirks

## 2014-12-05 ENCOUNTER — Other Ambulatory Visit: Payer: Self-pay | Admitting: Family Medicine

## 2014-12-05 MED ORDER — CLONAZEPAM 1 MG PO TABS: 1.0000 mg | ORAL_TABLET | Freq: Three times a day (TID) | ORAL | Status: DC

## 2014-12-05 NOTE — Telephone Encounter (Signed)
Last ov was on 11/08/2014 and pt is scheduled for an appointment on 12/23/2014.  Thanks,

## 2014-12-05 NOTE — Telephone Encounter (Signed)
Pt contacted office for refill request on the following medications:  clonazePAM (KLONOPIN) 1 MG.  [facility] mail order.  CB#[phone]/MW

## 2014-12-09 ENCOUNTER — Telehealth: Payer: Self-pay

## 2014-12-09 NOTE — Telephone Encounter (Signed)
Clonazepam was refilled called into [facility] on 12-05-14 with one refill. Can't refill again right now.

## 2014-12-09 NOTE — Telephone Encounter (Signed)
Refill request received from [facility] for Clonazepam

## 2014-12-12 ENCOUNTER — Other Ambulatory Visit: Payer: Self-pay | Admitting: Family Medicine

## 2014-12-12 NOTE — Telephone Encounter (Signed)
Patient advised as directed below. Patient states he does not need a refill right now his last refill was on 11/22/14.

## 2014-12-12 NOTE — Telephone Encounter (Signed)
Contacted [facility] and the pharmacist states they did not received a phoned in refill for Clonazepam on 8/15. Verbal given to pharmacist. Patient is aware RX has be phoned in to [facility].

## 2014-12-12 NOTE — Telephone Encounter (Signed)
Pt is requesting a call back.  Pt states [facility] has not rec'd the Rx refill for clonazePAM (KLONOPIN) 1 MG.  CB#[phone]/MW

## 2014-12-15 ENCOUNTER — Other Ambulatory Visit: Payer: Self-pay | Admitting: Family Medicine

## 2014-12-15 DIAGNOSIS — M545 Low back pain, unspecified: Secondary | ICD-10-CM

## 2014-12-15 DIAGNOSIS — G8929 Other chronic pain: Secondary | ICD-10-CM

## 2014-12-15 MED ORDER — HYDROCODONE-ACETAMINOPHEN 7.5-325 MG PO TABS: 1.0000 | ORAL_TABLET | Freq: Three times a day (TID) | ORAL | Status: DC | PRN

## 2014-12-15 NOTE — Telephone Encounter (Signed)
HYDROcodone-acetaminophen (NORCO) 7.5-325 MG per tablet 11/24/14 -- Jodell Cipro Chrismon, PA Take 1 tablet by mouth 3 (three) times daily as needed.  Pt called for pick up RX.  Thanks [employer]

## 2014-12-23 ENCOUNTER — Ambulatory Visit: Payer: Self-pay | Admitting: Family Medicine

## 2015-01-05 ENCOUNTER — Telehealth: Payer: Self-pay | Admitting: Family Medicine

## 2015-01-05 DIAGNOSIS — M545 Low back pain, unspecified: Secondary | ICD-10-CM

## 2015-01-05 DIAGNOSIS — G8929 Other chronic pain: Secondary | ICD-10-CM

## 2015-01-05 NOTE — Telephone Encounter (Signed)
Pt contacted office for refill request on the following medications:  HYDROcodone-acetaminophen (NORCO) 7.5-325 MG.  CB#[phone]/MW

## 2015-01-06 MED ORDER — HYDROCODONE-ACETAMINOPHEN 7.5-325 MG PO TABS: 1.0000 | ORAL_TABLET | Freq: Three times a day (TID) | ORAL | Status: DC | PRN

## 2015-01-06 NOTE — Telephone Encounter (Signed)
Pt called about RX. Pt was advised RX is ready. I didn't see the note to advise pt to keep appt. I just advised the RX was up front. Thanks TNP

## 2015-01-06 NOTE — Telephone Encounter (Signed)
Still maintaining fair control of pain with #60 tablets of Norco 7.5-325 mg a month. Keep follow up appointment on 02-10-15. Patient may pick up written prescription at the front desk.

## 2015-01-20 ENCOUNTER — Other Ambulatory Visit: Payer: Self-pay | Admitting: Family Medicine

## 2015-01-20 NOTE — Telephone Encounter (Signed)
David Reeves patient 

## 2015-01-24 ENCOUNTER — Other Ambulatory Visit: Payer: Self-pay | Admitting: Family Medicine

## 2015-01-24 DIAGNOSIS — M545 Low back pain, unspecified: Secondary | ICD-10-CM

## 2015-01-24 DIAGNOSIS — G8929 Other chronic pain: Secondary | ICD-10-CM

## 2015-01-24 MED ORDER — HYDROCODONE-ACETAMINOPHEN 7.5-325 MG PO TABS: 1.0000 | ORAL_TABLET | Freq: Three times a day (TID) | ORAL | Status: DC | PRN

## 2015-01-24 NOTE — Telephone Encounter (Signed)
Pt contacted office for refill request on the following medications: HYDROcodone-acetaminophen (NORCO) 7.5-325 MG per tablet. Pt would like to pick this up by Friday 01/27/15. Thanks TNP

## 2015-02-02 ENCOUNTER — Other Ambulatory Visit: Payer: Self-pay | Admitting: Family Medicine

## 2015-02-02 NOTE — Telephone Encounter (Signed)
Pt contacted office for refill request on the following medications: clonazePAM (KLONOPIN) 1 MG tablet to [facility]. Pt stated he won't run out until later this month but it takes a while for the mail order to process and get him the medication. Thanks TNP

## 2015-02-07 NOTE — Telephone Encounter (Signed)
[facility] faxed a form to complete for refill request. Will send it back today.

## 2015-02-07 NOTE — Telephone Encounter (Signed)
Patient advised as directed below. 

## 2015-02-10 ENCOUNTER — Ambulatory Visit (INDEPENDENT_AMBULATORY_CARE_PROVIDER_SITE_OTHER): Payer: 59 | Admitting: Family Medicine

## 2015-02-10 ENCOUNTER — Other Ambulatory Visit: Payer: Self-pay

## 2015-02-10 ENCOUNTER — Ambulatory Visit
Admission: RE | Admit: 2015-02-10 | Discharge: 2015-02-10 | Disposition: A | Discharge status: Home / Self Care | Payer: 59 | Source: Ambulatory Visit | Attending: Family Medicine | Admitting: Family Medicine

## 2015-02-10 ENCOUNTER — Encounter: Payer: Self-pay | Admitting: Family Medicine

## 2015-02-10 VITALS — BP 104/82 | HR 92 | Temp 98.5°F | Resp 16 | Wt 210.4 lb

## 2015-02-10 DIAGNOSIS — M545 Low back pain, unspecified: Secondary | ICD-10-CM

## 2015-02-10 DIAGNOSIS — M25562 Pain in left knee: Secondary | ICD-10-CM | POA: Diagnosis not present

## 2015-02-10 DIAGNOSIS — G8929 Other chronic pain: Secondary | ICD-10-CM | POA: Diagnosis not present

## 2015-02-10 DIAGNOSIS — F410 Panic disorder [episodic paroxysmal anxiety] without agoraphobia: Secondary | ICD-10-CM

## 2015-02-10 DIAGNOSIS — M25561 Pain in right knee: Secondary | ICD-10-CM | POA: Insufficient documentation

## 2015-02-10 MED ORDER — TIZANIDINE HCL 4 MG PO TABS: 4.0000 mg | ORAL_TABLET | Freq: Three times a day (TID) | ORAL | Status: DC

## 2015-02-10 MED ORDER — HYDROCODONE-ACETAMINOPHEN 7.5-325 MG PO TABS: 1.0000 | ORAL_TABLET | Freq: Three times a day (TID) | ORAL | Status: DC | PRN

## 2015-02-10 NOTE — Progress Notes (Signed)
Patient ID: David Reeves, male   DOB: 1971/01/15, 44 y.o.   MRN: [id number]    Subjective:  Anxiety Presents for follow-up visit. Onset was 1 to 5 years ago. The problem has been gradually improving. Symptoms include nervous/anxious behavior. Patient reports no depressed mood, hyperventilation, insomnia, irritability, panic or shortness of breath. Primary symptoms comment: slight chest tightness in chest in the early morning until he take the Clonazepam then the rest of the day is better. Symptoms occur most days. Exacerbated by: death of mother - but coping better now. The quality of sleep is good. Nighttime awakenings: occasional (due to back pain - early morning).   His past medical history is significant for anxiety/panic attacks. Past treatments include benzodiazephines and lifestyle changes. The treatment provided significant relief. Compliance with prior treatments has been good.   Pain: Patient states his upper back and bilateral knee pain has increased. Patient states the pain starts as a burning sensation. Spasms in upper back muscles worsening over the past 3 months. Occurs every other day and relieved with heating pad applications. Lower back pain down to 2-3/10 with lumbar support, NSAID, muscle relaxant and analgesic. Wonders if he should switch from Flexeril BID to the Tizanidine.   Prior to Admission medications   Medication Sig Start Date End Date Taking? Authorizing Provider  celecoxib (CELEBREX) 200 MG capsule Take 1 capsule by mouth 2 (two) times daily. 08/05/14  Yes Historical Provider, MD  clonazePAM (KLONOPIN) 1 MG tablet Take 1 tablet (1 mg total) by mouth every 8 (eight) hours. 12/05/14  Yes [name] E Chrismon, PA  cyclobenzaprine (FLEXERIL) 10 MG tablet TAKE 1 TABLET AT BEDTIME BY MOUTH, MAY TAKE EXTRA DOSE  IN THE MORNING IF NEEDED 01/20/15  Yes Anola Gurneyobert Chauvin, PA  HYDROcodone-acetaminophen (NORCO) 7.5-325 MG tablet Take 1 tablet by mouth 3 (three) times daily as needed.  01/24/15  Yes Tamsen Roersennis E Chrismon, PA    Patient Active Problem List   Diagnosis Date Noted  . Chronic low back pain 11/08/2014  . Hx of hemorrhoids 11/08/2014  . AAA (abdominal aortic aneurysm) (HCC) 09/22/2014  . Chronic pain associated with significant psychosocial dysfunction 09/22/2014  . Panic attack 09/22/2014  . AB (asthmatic bronchitis) 08/17/2014  . Anxiety 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 08/17/2014  . Backache 08/17/2014  . Lumbosacral spondylosis without myelopathy 08/17/2014  . 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 Brash 08/17/2014  . LBP (low back pain) 08/17/2014  . Tendon nodule 08/17/2014  . Episodic paroxysmal anxiety disorder 08/17/2014  . Hernia, inguinal, right 08/17/2014  . Fast heart beat 08/17/2014  . Illness 08/17/2014  . Inguinal hernia 10/14/2012    Past Medical History  Diagnosis Date  . Back pain 2014  . Bulging disc 2014  . Osteoarthritis   . Degenerative disc disease   . Bone spur   . Panic anxiety syndrome   . Taking multiple medications for chronic disease     Social History   Social History  . Marital Status: Married    Spouse Name: N/A  . Number of Children: N/A  . Years of Education: N/A   Occupational History  . Not on file.   Social History Main Topics  . Smoking status: Never Smoker   . Smokeless tobacco: Never Used  . Alcohol Use: No  . Drug Use: No  . Sexual Activity: Not on file   Other Topics Concern  . Not on file   Social History Narrative    Allergies  Allergen Reactions  . Duloxetine Hcl   . Penicillins Other (See Comments)    Unknown -- childhood reaction    Review of Systems  Constitutional: Negative.  Negative for irritability.  HENT: Negative.   Eyes: Negative.   Respiratory: Negative.  Negative for shortness of breath.   Cardiovascular: Negative.   Gastrointestinal: Negative.   Genitourinary: Negative.   Musculoskeletal: Positive for back pain and joint pain.  Skin: Negative.    Neurological: Negative.   Endo/Heme/Allergies: Negative.   Psychiatric/Behavioral: The patient is nervous/anxious. The patient does not have insomnia.     Immunization History  Administered Date(s) Administered  . Influenza-Unspecified 03/24/2014  . Tdap 12/14/2009   Objective:  BP 104/82 mmHg  Pulse 92  Temp(Src) 98.5 F (36.9 C) (Oral)  Resp 16  Wt 210 lb 6.4 oz (95.437 kg)  SpO2 94%  Physical Exam  Constitutional: He is oriented to person, place, and time and well-developed, well-nourished, and in no distress.  HENT:  Head: Normocephalic.  Eyes: Conjunctivae are normal.  Neck: Neck supple.  Cardiovascular: Normal rate and regular rhythm.   Pulmonary/Chest: Effort normal and breath sounds normal.  Abdominal: Soft.  Musculoskeletal: He exhibits tenderness.  Ache in knees with some crepitus felt behind both patellae. No swelling or redness. No ligament laxity. Stiffness with sharp pain in the upper lumbar region to test ROM. Mottled coloration of lower back from chronic use of heating pad. Good lower extremity strength. Rare pain into the posterior right leg.  Neurological: He is alert and oriented to person, place, and time. He has normal motor skills. He has a normal Straight Leg Raise Test.  Reflex Scores:      Patellar reflexes are 0 on the right side and 1+ on the left side.      Achilles reflexes are 0 on the right side and 1+ on the left side.   Lab Results  Component Value Date   WBC 6.2 09/23/2014   HGB 16.9 03/24/2014   HCT 45.6 09/23/2014   PLT 363 03/24/2014   GLUCOSE 108* 09/23/2014   CHOL 233* 05/12/2012   TRIG 85 05/12/2012   HDL 37 05/12/2012   LDLCALC 169 05/12/2012   TSH 1.76 05/12/2012    CMP     Component Value Date/Time   NA 137 09/23/2014 0909   NA 141 11/14/2013 0440   K 5.1 09/23/2014 0909   K 4.0 11/14/2013 0440   CL 102 09/23/2014 0909   CL 107 11/14/2013 0440   CO2 24 09/23/2014 0909   CO2 26 11/14/2013 0440   GLUCOSE 108*  09/23/2014 0909   GLUCOSE 116* 11/14/2013 0440   BUN 11 09/23/2014 0909   BUN 12 11/14/2013 0440   CREATININE 0.84 09/23/2014 0909   CREATININE 0.9 03/24/2014   CREATININE 0.85 11/14/2013 0440   CALCIUM 9.4 09/23/2014 0909   CALCIUM 9.0 11/14/2013 0440   PROT 6.7 09/23/2014 0909   PROT 8.1 11/14/2013 0440   ALBUMIN 4.2 09/23/2014 0909   ALBUMIN 4.2 11/14/2013 0440   AST 16 09/23/2014 0909   AST 20 11/14/2013 0440   ALT 20 09/23/2014 0909   ALT 35 11/14/2013 0440   ALKPHOS 63 09/23/2014 0909   ALKPHOS 64 11/14/2013 0440   BILITOT 0.4 09/23/2014 0909   BILITOT 1.1* 11/14/2013 0440   GFRNONAA 106 09/23/2014 0909   GFRNONAA >60 11/14/2013 0440   GFRAA 123 09/23/2014 0909   GFRAA >60 11/14/2013 0440    Assessment and Plan :  1. Bilateral knee  pain Recent increase in stiffness and pain in knees. Worse with standing a long time or riding in a car for long distances. Celebrex helps but pain recurs. Will get labs and x-ray evaluation for arthritis. - CBC with Differential/Platelet - Sedimentation rate - Rheumatoid Factor - DG Knee Complete 4 Views Left - DG Knee Complete 4 Views Right  2. Panic attack Stable and well controlled with Clonazepam TID. No acute panic attacks recently just generalized anxiety. Increase in panic and anxiety increases chronic back pain level. Will check routine labs and continue present regimen. - COMPLETE METABOLIC PANEL WITH GFR  3. Chronic low back pain Stable pain level on the Norco TID with Celebrex. Flexeril not working as well and wants to try to go back on the Tizanidine for muscle spasms in upper and lower back. Continue use of exercise and heat applications (for short intervals). Recheck 3 months. - tiZANidine (ZANAFLEX) 4 MG tablet; Take 1 tablet (4 mg total) by mouth 3 (three) times daily.  Dispense: 30 tablet; Refill: 0 - HYDROcodone-acetaminophen (NORCO) 7.5-325 MG tablet; Take 1 tablet by mouth 3 (three) times daily as needed.  Dispense: 60  tablet; Refill: 0   Dortha Kern [facility] 02/10/2015 8:40 AM

## 2015-02-11 LAB — CBC WITH DIFFERENTIAL/PLATELET
Basophils Absolute: 0.1 10*3/uL (ref 0.0–0.2)
Basos: 1 %
EOS (ABSOLUTE): 0.1 10*3/uL (ref 0.0–0.4)
Eos: 1 %
Hematocrit: 49.8 % (ref 37.5–51.0)
Hemoglobin: 17.1 g/dL (ref 12.6–17.7)
Immature Grans (Abs): 0 10*3/uL (ref 0.0–0.1)
Immature Granulocytes: 0 %
Lymphocytes Absolute: 3.2 10*3/uL — ABNORMAL HIGH (ref 0.7–3.1)
Lymphs: 38 %
MCH: 33.5 pg — ABNORMAL HIGH (ref 26.6–33.0)
MCHC: 34.3 g/dL (ref 31.5–35.7)
MCV: 98 fL — ABNORMAL HIGH (ref 79–97)
Monocytes Absolute: 0.6 10*3/uL (ref 0.1–0.9)
Monocytes: 7 %
Neutrophils Absolute: 4.4 10*3/uL (ref 1.4–7.0)
Neutrophils: 53 %
Platelets: 312 10*3/uL (ref 150–379)
RBC: 5.1 x10E6/uL (ref 4.14–5.80)
RDW: 12.8 % (ref 12.3–15.4)
WBC: 8.4 10*3/uL (ref 3.4–10.8)

## 2015-02-11 LAB — COMPREHENSIVE METABOLIC PANEL
ALT: 22 IU/L (ref 0–44)
AST: 16 IU/L (ref 0–40)
Albumin/Globulin Ratio: 1.8 (ref 1.1–2.5)
Albumin: 4.9 g/dL (ref 3.5–5.5)
Alkaline Phosphatase: 57 IU/L (ref 39–117)
BUN/Creatinine Ratio: 11 (ref 9–20)
BUN: 12 mg/dL (ref 6–24)
Bilirubin Total: 1 mg/dL (ref 0.0–1.2)
CO2: 25 mmol/L (ref 18–29)
Calcium: 10.4 mg/dL — ABNORMAL HIGH (ref 8.7–10.2)
Chloride: 100 mmol/L (ref 97–106)
Creatinine, Ser: 1.07 mg/dL (ref 0.76–1.27)
GFR calc Af Amer: 96 mL/min/{1.73_m2} (ref >59)
GFR calc non Af Amer: 83 mL/min/{1.73_m2} (ref >59)
Globulin, Total: 2.8 g/dL (ref 1.5–4.5)
Glucose: 103 mg/dL — ABNORMAL HIGH (ref 65–99)
Potassium: 5.2 mmol/L (ref 3.5–5.2)
Sodium: 141 mmol/L (ref 136–144)
Total Protein: 7.7 g/dL (ref 6.0–8.5)

## 2015-02-11 LAB — RHEUMATOID FACTOR: Rhuematoid fact SerPl-aCnc: <10 IU/mL (ref 0.0–13.9)

## 2015-02-11 LAB — SEDIMENTATION RATE: Sed Rate: 2 mm/hr (ref 0–15)

## 2015-02-13 ENCOUNTER — Telehealth: Payer: Self-pay | Admitting: Family Medicine

## 2015-02-13 DIAGNOSIS — M545 Low back pain, unspecified: Secondary | ICD-10-CM

## 2015-02-13 DIAGNOSIS — G8929 Other chronic pain: Secondary | ICD-10-CM

## 2015-02-13 MED ORDER — TIZANIDINE HCL 4 MG PO TABS: 4.0000 mg | ORAL_TABLET | Freq: Three times a day (TID) | ORAL | Status: DC

## 2015-02-13 NOTE — Telephone Encounter (Signed)
Pt contacted office for refill request on the following medications: tiZANidine (ZANAFLEX) 4 MG tablet to [facility]. Pt stated that this is working very well and he would like a 90 day supply sent to his mail order before the RX that he got on 02/10/15 runs out. Thanks TNP

## 2015-02-13 NOTE — Telephone Encounter (Signed)
Please see below-aa 

## 2015-02-13 NOTE — Telephone Encounter (Signed)
Tizanidine working better for muscle spasm and pain than the Cyclobenzaprine. Will send prescription change to mail order pharmacy.

## 2015-02-14 ENCOUNTER — Telehealth: Payer: Self-pay

## 2015-02-14 NOTE — Telephone Encounter (Signed)
-----   Message from Tamsen Roersennis E Chrismon, [place] sent at 02/10/2015  7:52 PM EDT ----- Normal x-rays of knees. No sign of bony abnormality or arthritis. Awaiting final lab reports.

## 2015-02-14 NOTE — Telephone Encounter (Signed)
Patient advised as directed below. Patient requested a follow up appointment to discuss lab and xray results. Patient scheduled for 02/16/2015.

## 2015-02-14 NOTE — Telephone Encounter (Signed)
-----   Message from Tamsen Roersennis E Chrismon, [place] sent at 02/13/2015  6:29 PM EDT ----- No sign of infection or rheumatoid arthritis. Proceed with present Celebrex and joint rub with Aspercreme with Lidocaine. Recheck prn.

## 2015-02-15 NOTE — Telephone Encounter (Signed)
error 

## 2015-02-16 ENCOUNTER — Encounter: Payer: Self-pay | Admitting: Family Medicine

## 2015-02-16 ENCOUNTER — Ambulatory Visit (INDEPENDENT_AMBULATORY_CARE_PROVIDER_SITE_OTHER): Payer: 59 | Admitting: Family Medicine

## 2015-02-16 VITALS — BP 118/84 | HR 95 | Temp 98.4°F | Resp 16 | Wt 209.2 lb

## 2015-02-16 DIAGNOSIS — M222X9 Patellofemoral disorders, unspecified knee: Secondary | ICD-10-CM

## 2015-02-16 NOTE — Patient Instructions (Signed)
Patellofemoral Pain Syndrome  Patellofemoral pain syndrome is a condition that involves a softening or breakdown of the tissue (cartilage) on the underside of your kneecap (patella). This causes pain in the front of the knee. The condition is also called runner's knee or chondromalacia patella. Patellofemoral pain syndrome is most common in young adults who are active in sports.  Your knee is the largest joint in your body. The patella covers the front of your knee and is attached to muscles above and below your knee. The underside of the patella is covered with a smooth type of cartilage (synovium). The smooth surface helps the patella glide easily when you move your knee. Patellofemoral pain syndrome causes swelling in the joint linings and bone surfaces in your knee.   CAUSES   Patellofemoral pain syndrome can be caused by:   Overuse.   Poor alignment of your knee joints.   Weak leg muscles.   A direct blow to your kneecap.  RISK FACTORS  You may be at risk for patellofemoral pain syndrome if you:   Do a lot of activities that can wear down your kneecap. These include:    Running.    Squatting.    Climbing stairs.   Start a new physical activity or exercise program.   Wear shoes that do not fit well.   Do not have good leg strength.   Are overweight.  SIGNS AND SYMPTOMS   Knee pain is the most common symptom of patellofemoral pain syndrome. This may feel like a dull, aching pain underneath your patella, in the front of your knee. There may be a popping or cracking sound when you move your knee. Pain may get worse with:   Exercise.   Climbing stairs.   Running.   Jumping.   Squatting.   Kneeling.   Sitting for a long time.   Moving or pushing on your patella.  DIAGNOSIS   Your health care provider may be able to diagnose patellofemoral pain syndrome from your symptoms and medical history. You may be asked about your recent physical activities and which ones cause knee pain. Your health care  provider may do a physical exam with certain tests to confirm the diagnosis. These may include:   Moving your patella back and forth.   Checking your range of knee motion.   Having you squat or jump to see if you have pain.   Checking the strength of your leg muscles.  An MRI of the knee may also be done.  TREATMENT   Patellofemoral pain syndrome can usually be treated at home with rest, ice, compression, and elevation (RICE). Other treatments may include:   Nonsteroidal anti-inflammatory drugs (NSAIDs).   Physical therapy to stretch and strengthen your leg muscles.   Shoe inserts (orthotics) to take stress off your knee.   A knee brace or knee support.   Surgery to remove damaged cartilage or move the patella to a better position. The need for surgery is rare.  HOME CARE INSTRUCTIONS    Take medicines only as directed by your health care provider.   Rest your knee.   When resting, keep your knee raised above the level of your heart.   Avoid activities that cause knee pain.   Apply ice to the injured area:   Put ice in a plastic bag.   Place a towel between your skin and the bag.   Leave the ice on for 20 minutes, 2-3 times a day.   Use splints,   braces, knee supports, or walking aids as directed by your health care provider.   Perform stretching and strengthening exercises as directed by your health care provider or physical therapist.   Keep all follow-up visits as directed by your health care provider. This is important.  SEEK MEDICAL CARE IF:    Your symptoms get worse.   You are not improving with home care.  MAKE SURE YOU:   Understand these instructions.   Will watch your condition.   Will get help right away if you are not doing well or get worse.     This information is not intended to replace advice given to you by your health care provider. Make sure you discuss any questions you have with your health care provider.     Document Released: 03/27/2009 Document Revised: 04/29/2014  Document Reviewed: 06/28/2013  [employer]vier Interactive Patient Education 2016 [employer]

## 2015-02-16 NOTE — Progress Notes (Signed)
Patient ID: David Reeves, male   DOB: 1971/04/17, 44 y.o.   MRN: [id number] Name: David Reeves   MRN: [id number]    DOB: 1971/04/17   Date:02/16/2015       Progress Note  Subjective Patient would like to discuss lab and x-ray results from 02/10/2015. Chief Complaint  Chief Complaint  Patient presents with  . Follow-up    to discuss lab and x ray results    HPI This 44 year old male still having pain, stiffness and occasional burning with pressure sensation behind knee caps. No known injury.   Past Surgical History  Procedure Laterality Date  . Toe surgery Right 2007  . Hernia repair Left 2006,2014    [facility]   Past Medical History  Diagnosis Date  . Back pain 2014  . Bulging disc 2014  . Osteoarthritis   . Degenerative disc disease   . Bone spur   . Panic anxiety syndrome   . Taking multiple medications for chronic disease    Social History  Substance Use Topics  . Smoking status: Never Smoker   . Smokeless tobacco: Never Used  . Alcohol Use: No    Current outpatient prescriptions:  .  celecoxib (CELEBREX) 200 MG capsule, Take 1 capsule by mouth 2 (two) times daily., Disp: , Rfl:  .  clonazePAM (KLONOPIN) 1 MG tablet, Take 1 tablet (1 mg total) by mouth every 8 (eight) hours., Disp: 90 tablet, Rfl: 1 .  cyclobenzaprine (FLEXERIL) 10 MG tablet, , Disp: , Rfl:  .  HYDROcodone-acetaminophen (NORCO) 7.5-325 MG tablet, Take 1 tablet by mouth 3 (three) times daily as needed., Disp: 60 tablet, Rfl: 0 .  tiZANidine (ZANAFLEX) 4 MG tablet, Take 1 tablet (4 mg total) by mouth 3 (three) times daily., Disp: 180 tablet, Rfl: 1  Allergies  Allergen Reactions  . Duloxetine Hcl   . Penicillins Other (See Comments)    Unknown -- childhood reaction   Review of Systems  Constitutional: Negative.   HENT: Negative.   Eyes: Negative.   Respiratory: Negative.   Cardiovascular: Negative.   Gastrointestinal: Negative.   Genitourinary: Negative.   Musculoskeletal: Positive  for back pain and joint pain.  Skin: Negative.   Neurological: Negative.   Endo/Heme/Allergies: Negative.   Psychiatric/Behavioral: Negative.    Objective  Filed Vitals:   02/16/15 0838  BP: 118/84  Pulse: 95  Temp: 98.4 F (36.9 C)  TempSrc: Oral  Resp: 16  Weight: 209 lb 3.2 oz (94.892 kg)  SpO2: 96%   Physical Exam  Constitutional: He is well-developed, well-nourished, and in no distress.  Musculoskeletal: He exhibits no tenderness.  Intermittent tenderness in both patellar regions. No swelling or redness in either knee.    Recent Results (from the past 2160 hour(s))  CBC with Differential/Platelet     Status: Abnormal   Collection Time: 02/10/15 10:32 AM  Result Value Ref Range   WBC 8.4 3.4 - 10.8 x10E3/uL   RBC 5.10 4.14 - 5.80 x10E6/uL   Hemoglobin 17.1 12.6 - 17.7 g/dL   Hematocrit 86.549.8 78.437.5 - 51.0 %   MCV 98 (H) 79 - 97 fL   MCH 33.5 (H) 26.6 - 33.0 pg   MCHC 34.3 31.5 - 35.7 g/dL   RDW 69.612.8 29.512.3 - 28.415.4 %   Platelets 312 150 - 379 x10E3/uL   Neutrophils 53 %   Lymphs 38 %   Monocytes 7 %   Eos 1 %   Basos 1 %   Neutrophils Absolute 4.4  1.4 - 7.0 x10E3/uL   Lymphocytes Absolute 3.2 (H) 0.7 - 3.1 x10E3/uL   Monocytes Absolute 0.6 0.1 - 0.9 x10E3/uL   EOS (ABSOLUTE) 0.1 0.0 - 0.4 x10E3/uL   Basophils Absolute 0.1 0.0 - 0.2 x10E3/uL   Immature Granulocytes 0 %   Immature Grans (Abs) 0.0 0.0 - 0.1 x10E3/uL  Sedimentation rate     Status: None   Collection Time: 02/10/15 10:32 AM  Result Value Ref Range   Sed Rate 2 0 - 15 mm/hr  Rheumatoid Factor     Status: None   Collection Time: 02/10/15 10:32 AM  Result Value Ref Range   Rhuematoid fact SerPl-aCnc <10.0 0.0 - 13.9 IU/mL  Comprehensive metabolic panel     Status: Abnormal   Collection Time: 02/10/15 10:32 AM  Result Value Ref Range   Glucose 103 (H) 65 - 99 mg/dL   BUN 12 6 - 24 mg/dL   Creatinine, Ser 1.61 0.76 - 1.27 mg/dL   GFR calc non Af Amer 83 >59 mL/min/1.73   GFR calc Af Amer 96 >59  mL/min/1.73   BUN/Creatinine Ratio 11 9 - 20   Sodium 141 136 - 144 mmol/L    Comment:               **Please note reference interval change**   Potassium 5.2 3.5 - 5.2 mmol/L    Comment:               **Please note reference interval change**   Chloride 100 97 - 106 mmol/L    Comment:               **Please note reference interval change**   CO2 25 18 - 29 mmol/L   Calcium 10.4 (H) 8.7 - 10.2 mg/dL   Total Protein 7.7 6.0 - 8.5 g/dL   Albumin 4.9 3.5 - 5.5 g/dL   Globulin, Total 2.8 1.5 - 4.5 g/dL   Albumin/Globulin Ratio 1.8 1.1 - 2.5   Bilirubin Total 1.0 0.0 - 1.2 mg/dL   Alkaline Phosphatase 57 39 - 117 IU/L   AST 16 0 - 40 IU/L   ALT 22 0 - 44 IU/L   Assessment & Plan  1. Patellofemoral syndrome, unspecified laterality All blood tests essentially normal. No sign of infection. Negative sedrate and RA factor. X-rays of knees are normal. Pressure and ache behind knees and worse with first standing from a seated position. Recommend using Aspercreme with Lidocaine and some ROM exercises. May use moist heat or ice packs. If no improvement in the next couple weeks, may need referral to orthopedist.

## 2015-02-20 ENCOUNTER — Telehealth: Payer: Self-pay | Admitting: Family Medicine

## 2015-02-20 DIAGNOSIS — M222X2 Patellofemoral disorders, left knee: Principal | ICD-10-CM

## 2015-02-20 DIAGNOSIS — M222X1 Patellofemoral disorders, right knee: Secondary | ICD-10-CM

## 2015-02-20 NOTE — Telephone Encounter (Signed)
Pt stated that he has been following the directions that were discussed at his O[facility] on 02/16/15 but he is still having a lot of knee pain and wanted to know what he should try next. Please advise. Thanks TNP

## 2015-02-21 NOTE — Telephone Encounter (Signed)
Patient advised as directed below. Patient verbalized understanding and agrees with treatment plan. Referral ordered.

## 2015-02-21 NOTE — Telephone Encounter (Signed)
Recommend orthopedic referral for further evaluation and possible cortisone injection. Can be scheduled when he is ready.

## 2015-03-06 ENCOUNTER — Other Ambulatory Visit: Payer: Self-pay | Admitting: Family Medicine

## 2015-03-06 DIAGNOSIS — M545 Low back pain, unspecified: Secondary | ICD-10-CM

## 2015-03-06 DIAGNOSIS — G8929 Other chronic pain: Secondary | ICD-10-CM

## 2015-03-06 NOTE — Telephone Encounter (Signed)
Pt contacted office for refill request on the following medications: HYDROcodone-acetaminophen (NORCO) 7.5-325 MG tablet. Pt stated he will be out noon on 03/09/15 and since Maurine MinisterDennis is out of the office on Wednesday he would like to get it tomorrow. Thanks TNP

## 2015-03-07 MED ORDER — HYDROCODONE-ACETAMINOPHEN 7.5-325 MG PO TABS: 1.0000 | ORAL_TABLET | Freq: Three times a day (TID) | ORAL | Status: DC | PRN

## 2015-03-07 NOTE — Telephone Encounter (Signed)
Advise patient refill printed for pick up at the front desk. Keep follow up appointment on 05-15-15 as planned.

## 2015-03-07 NOTE — Telephone Encounter (Signed)
Patient advised as directed below. Patient verbalized understanding.  

## 2015-03-13 ENCOUNTER — Telehealth: Payer: Self-pay | Admitting: Family Medicine

## 2015-03-13 NOTE — Telephone Encounter (Signed)
Attempted to contact patient. No answer nor voicemail.  

## 2015-03-13 NOTE — Telephone Encounter (Addendum)
Pt. States he's just calling to update Dr.Chrismon on his  issue about the injections he has received in both knee's pt state's there are not helping much, pt states he feels like it's slowly going back to like it was at the beginning with both knee's. Pt states nurse could call him if Dr.Chrismon feels like he would like to see him or if there is anything else that can be done that will help pt states just let him know which ever and that the pt states he will do what ever it takes. CB# [phone] Thanks, CC

## 2015-03-13 NOTE — Telephone Encounter (Signed)
May try Aspercreme with Lidocaine for knee pain. If now better in 4-5 days, may need to try another topical such as Voltaren Gel or Pennsaid (prescription medications.)

## 2015-03-21 ENCOUNTER — Ambulatory Visit (INDEPENDENT_AMBULATORY_CARE_PROVIDER_SITE_OTHER): Payer: 59 | Admitting: Family Medicine

## 2015-03-21 ENCOUNTER — Encounter: Payer: Self-pay | Admitting: Family Medicine

## 2015-03-21 VITALS — BP 126/82 | HR 72 | Temp 97.5°F | Resp 16 | Wt 210.0 lb

## 2015-03-21 DIAGNOSIS — M545 Low back pain, unspecified: Secondary | ICD-10-CM

## 2015-03-21 DIAGNOSIS — M222X2 Patellofemoral disorders, left knee: Secondary | ICD-10-CM | POA: Diagnosis not present

## 2015-03-21 DIAGNOSIS — G8929 Other chronic pain: Secondary | ICD-10-CM | POA: Diagnosis not present

## 2015-03-21 DIAGNOSIS — M222X1 Patellofemoral disorders, right knee: Secondary | ICD-10-CM

## 2015-03-21 NOTE — Patient Instructions (Signed)
Patellofemoral Pain Syndrome  Patellofemoral pain syndrome is a condition that involves a softening or breakdown of the tissue (cartilage) on the underside of your kneecap (patella). This causes pain in the front of the knee. The condition is also called runner's knee or chondromalacia patella. Patellofemoral pain syndrome is most common in young adults who are active in sports.  Your knee is the largest joint in your body. The patella covers the front of your knee and is attached to muscles above and below your knee. The underside of the patella is covered with a smooth type of cartilage (synovium). The smooth surface helps the patella glide easily when you move your knee. Patellofemoral pain syndrome causes swelling in the joint linings and bone surfaces in your knee.   CAUSES   Patellofemoral pain syndrome can be caused by:   Overuse.   Poor alignment of your knee joints.   Weak leg muscles.   A direct blow to your kneecap.  RISK FACTORS  You may be at risk for patellofemoral pain syndrome if you:   Do a lot of activities that can wear down your kneecap. These include:    Running.    Squatting.    Climbing stairs.   Start a new physical activity or exercise program.   Wear shoes that do not fit well.   Do not have good leg strength.   Are overweight.  SIGNS AND SYMPTOMS   Knee pain is the most common symptom of patellofemoral pain syndrome. This may feel like a dull, aching pain underneath your patella, in the front of your knee. There may be a popping or cracking sound when you move your knee. Pain may get worse with:   Exercise.   Climbing stairs.   Running.   Jumping.   Squatting.   Kneeling.   Sitting for a long time.   Moving or pushing on your patella.  DIAGNOSIS   Your health care provider may be able to diagnose patellofemoral pain syndrome from your symptoms and medical history. You may be asked about your recent physical activities and which ones cause knee pain. Your health care  provider may do a physical exam with certain tests to confirm the diagnosis. These may include:   Moving your patella back and forth.   Checking your range of knee motion.   Having you squat or jump to see if you have pain.   Checking the strength of your leg muscles.  An MRI of the knee may also be done.  TREATMENT   Patellofemoral pain syndrome can usually be treated at home with rest, ice, compression, and elevation (RICE). Other treatments may include:   Nonsteroidal anti-inflammatory drugs (NSAIDs).   Physical therapy to stretch and strengthen your leg muscles.   Shoe inserts (orthotics) to take stress off your knee.   A knee brace or knee support.   Surgery to remove damaged cartilage or move the patella to a better position. The need for surgery is rare.  HOME CARE INSTRUCTIONS    Take medicines only as directed by your health care provider.   Rest your knee.   When resting, keep your knee raised above the level of your heart.   Avoid activities that cause knee pain.   Apply ice to the injured area:   Put ice in a plastic bag.   Place a towel between your skin and the bag.   Leave the ice on for 20 minutes, 2-3 times a day.   Use splints,   braces, knee supports, or walking aids as directed by your health care provider.   Perform stretching and strengthening exercises as directed by your health care provider or physical therapist.   Keep all follow-up visits as directed by your health care provider. This is important.  SEEK MEDICAL CARE IF:    Your symptoms get worse.   You are not improving with home care.  MAKE SURE YOU:   Understand these instructions.   Will watch your condition.   Will get help right away if you are not doing well or get worse.     This information is not intended to replace advice given to you by your health care provider. Make sure you discuss any questions you have with your health care provider.     Document Released: 03/27/2009 Document Revised: 04/29/2014  Document Reviewed: 06/28/2013  [employer]vier Interactive Patient Education 2016 [employer]

## 2015-03-21 NOTE — Progress Notes (Signed)
Patient ID: David Reeves, male   DOB: [birth year], 44 y.o.   MRN: [id number] Name: David Reeves   MRN: [id number]    DOB: April 01, 1971   Date:03/21/2015       Progress Note  Subjective  Chief Complaint  Chief Complaint  Patient presents with  . Knee Pain  . Back Pain  . Follow-up    Knee Pain  The incident occurred more than 1 week ago. There was no injury mechanism. The pain is present in the left knee and right knee. The quality of the pain is described as aching. The pain is moderate. Pain course: Slight improvement for a while after the orthopedist gave him cortisone injection but it is returning now.  Back Pain This is a chronic problem. The current episode started more than 1 year ago. The problem occurs daily. The problem has been waxing and waning (Never completely abated.) since onset. The pain is present in the lumbar spine and thoracic spine. The quality of the pain is described as stabbing and aching. Pain scale: Had a severe sharp pain iwth tightness in the upper lumbar lower thoracic region Thanksfgiving day (pain 10/10). After Norco and Tizanidine at bedtime, pain dropped to 3-4/10 the next morning. The symptoms are aggravated by bending, position, standing, twisting and sitting (Chronic pain limits length of time he can stay in any position.). Stiffness is present all day. He has tried analgesics, bed rest, heat, home exercises, ice, muscle relaxant, NSAIDs and walking (Brace helps him ambulate better.) for the symptoms.   Past Surgical History  Procedure Laterality Date  . Toe surgery Right 2007  . Hernia repair Left 2006,2014    [facility]   Family History  Problem Relation Age of Onset  . Diabetes Mother   . Diabetes Maternal Uncle   . Diabetes Maternal Grandmother   . Heart disease Maternal Grandmother     Past Medical History  Diagnosis Date  . Back pain 2014  . Bulging disc 2014  . Osteoarthritis   . Degenerative disc disease   . Bone spur   . Panic  anxiety syndrome   . Taking multiple medications for chronic disease     Social History  Substance Use Topics  . Smoking status: Never Smoker   . Smokeless tobacco: Never Used  . Alcohol Use: No    Current outpatient prescriptions:  .  celecoxib (CELEBREX) 200 MG capsule, Take 1 capsule by mouth 2 (two) times daily., Disp: , Rfl:  .  clonazePAM (KLONOPIN) 1 MG tablet, Take 1 tablet (1 mg total) by mouth every 8 (eight) hours., Disp: 90 tablet, Rfl: 1 .  HYDROcodone-acetaminophen (NORCO) 7.5-325 MG tablet, Take 1 tablet by mouth 3 (three) times daily as needed., Disp: 60 tablet, Rfl: 0 .  tiZANidine (ZANAFLEX) 4 MG tablet, Take 1 tablet (4 mg total) by mouth 3 (three) times daily., Disp: 180 tablet, Rfl: 1  Allergies  Allergen Reactions  . Duloxetine Hcl   . Penicillins Other (See Comments)    Unknown -- childhood reaction    Review of Systems  Constitutional: Negative.   HENT: Negative.   Eyes: Negative.   Respiratory: Negative.   Cardiovascular: Negative.   Gastrointestinal: Negative.   Genitourinary: Negative.   Musculoskeletal: Positive for back pain and joint pain.  Skin: Negative.   Neurological: Negative.   Psychiatric/Behavioral: Negative.     Objective  Filed Vitals:   03/21/15 0844  BP: 126/82  Pulse: 72  Temp: 97.5 F (36.4 C)  TempSrc: Oral  Resp: 16  Weight: 210 lb (95.255 kg)    Physical Exam  Constitutional: He is oriented to person, place, and time and well-developed, well-nourished, and in no distress.  HENT:  Head: Normocephalic.  Eyes: Conjunctivae are normal.  Neck: Neck supple.  Cardiovascular: Normal rate and regular rhythm.   Pulmonary/Chest: Effort normal and breath sounds normal.  Musculoskeletal:  Unchanged low back pain and limited ROM. DTR's symmetric today without muscle weakness. Some crepitus behind both knees - patellofemoral syndrome per orthopedist.  Neurological: He is alert and oriented to person, place, and time.   Psychiatric: Memory, affect and judgment normal. His mood appears anxious.     Recent Results (from the past 2160 hour(s))  CBC with Differential/Platelet     Status: Abnormal   Collection Time: 02/10/15 10:32 AM  Result Value Ref Range   WBC 8.4 3.4 - 10.8 x10E3/uL   RBC 5.10 4.14 - 5.80 x10E6/uL   Hemoglobin 17.1 12.6 - 17.7 g/dL   Hematocrit 16.149.8 09.637.5 - 51.0 %   MCV 98 (H) 79 - 97 fL   MCH 33.5 (H) 26.6 - 33.0 pg   MCHC 34.3 31.5 - 35.7 g/dL   RDW 04.512.8 40.912.3 - 81.115.4 %   Platelets 312 150 - 379 x10E3/uL   Neutrophils 53 %   Lymphs 38 %   Monocytes 7 %   Eos 1 %   Basos 1 %   Neutrophils Absolute 4.4 1.4 - 7.0 x10E3/uL   Lymphocytes Absolute 3.2 (H) 0.7 - 3.1 x10E3/uL   Monocytes Absolute 0.6 0.1 - 0.9 x10E3/uL   EOS (ABSOLUTE) 0.1 0.0 - 0.4 x10E3/uL   Basophils Absolute 0.1 0.0 - 0.2 x10E3/uL   Immature Granulocytes 0 %   Immature Grans (Abs) 0.0 0.0 - 0.1 x10E3/uL  Sedimentation rate     Status: None   Collection Time: 02/10/15 10:32 AM  Result Value Ref Range   Sed Rate 2 0 - 15 mm/hr  Rheumatoid Factor     Status: None   Collection Time: 02/10/15 10:32 AM  Result Value Ref Range   Rhuematoid fact SerPl-aCnc <10.0 0.0 - 13.9 IU/mL  Comprehensive metabolic panel     Status: Abnormal   Collection Time: 02/10/15 10:32 AM  Result Value Ref Range   Glucose 103 (H) 65 - 99 mg/dL   BUN 12 6 - 24 mg/dL   Creatinine, Ser 9.141.07 0.76 - 1.27 mg/dL   GFR calc non Af Amer 83 >59 mL/min/1.73   GFR calc Af Amer 96 >59 mL/min/1.73   BUN/Creatinine Ratio 11 9 - 20   Sodium 141 136 - 144 mmol/L    Comment:               **Please note reference interval change**   Potassium 5.2 3.5 - 5.2 mmol/L    Comment:               **Please note reference interval change**   Chloride 100 97 - 106 mmol/L    Comment:               **Please note reference interval change**   CO2 25 18 - 29 mmol/L   Calcium 10.4 (H) 8.7 - 10.2 mg/dL   Total Protein 7.7 6.0 - 8.5 g/dL   Albumin 4.9 3.5 - 5.5  g/dL   Globulin, Total 2.8 1.5 - 4.5 g/dL   Albumin/Globulin Ratio 1.8 1.1 - 2.5   Bilirubin Total 1.0 0.0 - 1.2 mg/dL   Alkaline  Phosphatase 57 39 - 117 IU/L   AST 16 0 - 40 IU/L   ALT 22 0 - 44 IU/L    Assessment & Plan  1. Patellofemoral syndrome of both knees Some recurrence of pain in knees. Recommend he return to orthopedist. Advised this may require a series of cortisone injections and sometime surgery.  2. Chronic low back pain Persistent pain essentially unchanged. Feels the present regimen of exercise, back brace, heating pad applications, Norco 7.5/325 mg one TID, Celebrex 200 mg BID, Tizanidine 4 mg one TID and Klonopin 1 mg one TID has him in the most stable condition ever. Will continue this regimen. May need to consider referral to physiatrist. Recheck in 3 months.

## 2015-03-27 ENCOUNTER — Telehealth: Payer: Self-pay | Admitting: Family Medicine

## 2015-03-27 DIAGNOSIS — G8929 Other chronic pain: Secondary | ICD-10-CM

## 2015-03-27 DIAGNOSIS — M545 Low back pain, unspecified: Secondary | ICD-10-CM

## 2015-03-27 MED ORDER — HYDROCODONE-ACETAMINOPHEN 7.5-325 MG PO TABS: 1.0000 | ORAL_TABLET | Freq: Three times a day (TID) | ORAL | Status: DC | PRN

## 2015-03-27 NOTE — Telephone Encounter (Signed)
Pt called back and was advised. Thanks TNP

## 2015-03-27 NOTE — Telephone Encounter (Signed)
Attempted to contact patient. No answer nor voicemail. RX is at front desk for pick up.

## 2015-03-27 NOTE — Telephone Encounter (Signed)
Written prescription ready for pickup

## 2015-03-27 NOTE — Telephone Encounter (Signed)
Pr needs refill HYDROcodone-acetaminophen (NORCO) 7.5-325 MG tablet Taking 03/07/15 -- Jodell Ciproennis E Chrismon, PA Take 1 tablet by mouth 3 (three) times daily as needed.   Thanks Barth Kirkseri

## 2015-04-07 ENCOUNTER — Other Ambulatory Visit: Payer: Self-pay | Admitting: Family Medicine

## 2015-04-07 DIAGNOSIS — M545 Low back pain, unspecified: Secondary | ICD-10-CM

## 2015-04-07 DIAGNOSIS — G8929 Other chronic pain: Secondary | ICD-10-CM

## 2015-04-07 MED ORDER — TIZANIDINE HCL 4 MG PO TABS: 4.0000 mg | ORAL_TABLET | Freq: Three times a day (TID) | ORAL | Status: DC

## 2015-04-13 ENCOUNTER — Other Ambulatory Visit: Payer: Self-pay

## 2015-04-13 ENCOUNTER — Other Ambulatory Visit: Payer: Self-pay | Admitting: Family Medicine

## 2015-04-13 DIAGNOSIS — G8929 Other chronic pain: Secondary | ICD-10-CM

## 2015-04-13 DIAGNOSIS — M545 Low back pain, unspecified: Secondary | ICD-10-CM

## 2015-04-13 MED ORDER — HYDROCODONE-ACETAMINOPHEN 7.5-325 MG PO TABS: 1.0000 | ORAL_TABLET | Freq: Three times a day (TID) | ORAL | Status: DC | PRN

## 2015-04-13 MED ORDER — CLONAZEPAM 1 MG PO TABS: 1.0000 mg | ORAL_TABLET | Freq: Three times a day (TID) | ORAL | Status: DC

## 2015-04-13 NOTE — Telephone Encounter (Signed)
This is a pt of Maurine MinisterDennis and pt was advised that Maurine MinisterDennis is out of the office.  Pt contacted office for refill request on the following medications: 1. HYDROcodone-acetaminophen (NORCO) 7.5-325 MG tablet Pt would like to pick this up tomorrow 04/14/15 if possible b/c he will run out when we are closed for Christmas. 2. clonazePAM (KLONOPIN) 1 MG tablet to be sent to mail order [facility] pt would like 90 day supply. Thanks TNP

## 2015-04-13 NOTE — Telephone Encounter (Signed)
Please review-aa 

## 2015-04-13 NOTE — Telephone Encounter (Signed)
Ok to write for 60 for norco--90 days with no rf on colonazepam

## 2015-04-13 NOTE — Telephone Encounter (Signed)
Done-aa 

## 2015-04-14 ENCOUNTER — Encounter: Payer: 59 | Admitting: Family Medicine

## 2015-04-14 ENCOUNTER — Encounter: Payer: 59 | Admitting: Physician Assistant

## 2015-05-04 ENCOUNTER — Other Ambulatory Visit: Payer: Self-pay | Admitting: Family Medicine

## 2015-05-04 DIAGNOSIS — M545 Low back pain, unspecified: Secondary | ICD-10-CM

## 2015-05-04 DIAGNOSIS — G8929 Other chronic pain: Secondary | ICD-10-CM

## 2015-05-04 MED ORDER — HYDROCODONE-ACETAMINOPHEN 7.5-325 MG PO TABS: 1.0000 | ORAL_TABLET | Freq: Three times a day (TID) | ORAL | Status: DC | PRN

## 2015-05-04 NOTE — Telephone Encounter (Signed)
Pt contacted office for refill request on the following medications:  HYDROcodone-acetaminophen (NORCO) 7.5-325 MG tablet.  CB#[phone]/MW °

## 2015-05-04 NOTE — Telephone Encounter (Signed)
Prescription printed for refill and can be picked up at the front desk. Keep follow up appointment scheduled for 05-15-15.

## 2015-05-04 NOTE — Telephone Encounter (Signed)
Patient is aware RX is ready for pick up, and to keep follow up appointment scheduled.

## 2015-05-15 ENCOUNTER — Ambulatory Visit (INDEPENDENT_AMBULATORY_CARE_PROVIDER_SITE_OTHER): Payer: 59 | Admitting: Family Medicine

## 2015-05-15 ENCOUNTER — Encounter: Payer: Self-pay | Admitting: Family Medicine

## 2015-05-15 VITALS — BP 114/84 | HR 76 | Temp 97.6°F | Resp 16 | Wt 212.0 lb

## 2015-05-15 DIAGNOSIS — G8929 Other chronic pain: Secondary | ICD-10-CM | POA: Diagnosis not present

## 2015-05-15 DIAGNOSIS — M545 Low back pain, unspecified: Secondary | ICD-10-CM

## 2015-05-15 DIAGNOSIS — F064 Anxiety disorder due to known physiological condition: Secondary | ICD-10-CM | POA: Diagnosis not present

## 2015-05-15 NOTE — Progress Notes (Signed)
Patient ID: David Reeves, male   DOB: 05-24-1970, 45 y.o.   MRN: [id number]   Patient: David Reeves Male    DOB: 1970-07-30   45 y.o.   MRN: [id number] Visit Date: 05/15/2015  Today's Provider: Dortha Kern, PA   Chief Complaint  Patient presents with  . Back Pain  . Follow-up  . Medication Refill   Subjective:    Back Pain This is a chronic problem. The current episode started more than 1 year ago. The problem occurs constantly. The problem has been waxing and waning since onset. The pain is present in the lumbar spine and thoracic spine. The quality of the pain is described as aching and stabbing. The pain does not radiate. Pain scale: on good day 3-4/10 and on bad days 7-8/10. The pain is the same all the time. The symptoms are aggravated by bending, position, standing, twisting and sitting. Stiffness is present at night. Associated symptoms include weakness. Pertinent negatives include no leg pain, numbness or paresthesias. (Weakness in back) He has tried muscle relaxant, analgesics, heat, NSAIDs and home exercises (Hydrocodone) for the symptoms. Improvement on treatment: stable.   Patient Active Problem List   Diagnosis Date Noted  . Chronic low back pain 11/08/2014  . Hx of hemorrhoids 11/08/2014  . AAA (abdominal aortic aneurysm) (HCC) 09/22/2014  . Chronic pain associated with significant psychosocial dysfunction 09/22/2014  . Panic attack 09/22/2014  . AB (asthmatic bronchitis) 08/17/2014  . Anxiety 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 08/17/2014  . Backache 08/17/2014  . Lumbosacral spondylosis without myelopathy 08/17/2014  . 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 Brash 08/17/2014  . LBP (low back pain) 08/17/2014  . Tendon nodule 08/17/2014  . Episodic paroxysmal anxiety disorder 08/17/2014  . Hernia, inguinal, right 08/17/2014  . Fast heart beat 08/17/2014  . Illness 08/17/2014  . Inguinal hernia 10/14/2012   Past Surgical History  Procedure  Laterality Date  . Toe surgery Right 2007  . Hernia repair Left 2006,2014    [facility]   Family History  Problem Relation Age of Onset  . Diabetes Mother   . Diabetes Maternal Uncle   . Diabetes Maternal Grandmother   . Heart disease Maternal Grandmother    Allergies  Allergen Reactions  . Duloxetine Hcl   . Penicillins Other (See Comments)    Unknown -- childhood reaction     Previous Medications   CELECOXIB (CELEBREX) 200 MG CAPSULE    Take 1 capsule by mouth 2 (two) times daily.   CLONAZEPAM (KLONOPIN) 1 MG TABLET    Take 1 tablet (1 mg total) by mouth every 8 (eight) hours.   HYDROCODONE-ACETAMINOPHEN (NORCO) 7.5-325 MG TABLET    Take 1 tablet by mouth 3 (three) times daily as needed.   TIZANIDINE (ZANAFLEX) 4 MG TABLET    Take 1 tablet by mouth 3  times daily    Review of Systems  HENT: Negative.   Eyes: Negative.   Respiratory: Negative.   Cardiovascular: Negative.   Gastrointestinal: Negative.   Endocrine: Negative.   Genitourinary: Negative.   Musculoskeletal: Positive for back pain.  Skin: Negative.   Allergic/Immunologic: Negative.   Neurological: Positive for weakness. Negative for numbness and paresthesias.  Hematological: Negative.   Psychiatric/Behavioral: Negative.     Social History  Substance Use Topics  . Smoking status: Never Smoker   . Smokeless tobacco: Never Used  . Alcohol Use: No   Objective:   BP 114/84 mmHg  Pulse 76  Temp(Src) 97.6  F (36.4 C) (Oral)  Resp 16  Wt 212 lb (96.163 kg)  Physical Exam  Constitutional: He is oriented to person, place, and time. He appears well-developed and well-nourished.  HENT:  Head: Normocephalic.  Right Ear: External ear normal.  Left Ear: External ear normal.  Nose: Nose normal.  Mouth/Throat: Oropharynx is clear and moist.  Eyes: EOM are normal. Pupils are equal, round, and reactive to light.  Neck: Normal range of motion. Neck supple.  Cardiovascular: Normal rate, regular rhythm and normal  heart sounds.   Pulmonary/Chest: Effort normal and breath sounds normal.  Abdominal: Soft. Bowel sounds are normal.  Musculoskeletal:  Tender across lower back and lower thoracic spine. Very stiff and sharp pain to try to bend or flex.  Neurological: He is alert and oriented to person, place, and time.  DTR's 1+ right knee and 2+ left knee.   Skin:  Mottled skin across lumbar and lower thoracic spine area from chronic heating pad use.  Psychiatric: He has a normal mood and affect. His behavior is normal.  Occasional panic and anxiety. Clonazepam controls well.      Assessment & Plan:      1. Chronic low back pain Stable/unchanged chronic pain in back with history of spondylosis. Still refuses surgical intervention and did not want to go back to pain management clinic because he does not want to get back on stronger narcotics again. Feels the use of Norco 7.5/325 mg one TID, Celebrex 200 mg BID and Tizanidine 4 mg one TID managing pain well. [facility] CPE in 3 months and offered referral to physiatrist.  2. Anxiety 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 Well controlled anxiety and panic attacks with use of Klonopin 1 mg TID. Sleep is disturbed with back and knee pains (patellofemoral syndrome). Recheck in 3 months.

## 2015-05-23 ENCOUNTER — Other Ambulatory Visit: Payer: Self-pay | Admitting: Family Medicine

## 2015-05-23 DIAGNOSIS — M545 Low back pain, unspecified: Secondary | ICD-10-CM

## 2015-05-23 DIAGNOSIS — G8929 Other chronic pain: Secondary | ICD-10-CM

## 2015-05-23 MED ORDER — HYDROCODONE-ACETAMINOPHEN 7.5-325 MG PO TABS: 1.0000 | ORAL_TABLET | Freq: Three times a day (TID) | ORAL | Status: DC | PRN

## 2015-05-23 NOTE — Telephone Encounter (Signed)
Printed prescription will be at the front desk for pick up Friday.

## 2015-05-23 NOTE — Telephone Encounter (Signed)
Pt contacted office for refill request on the following medications: HYDROcodone-acetaminophen (NORCO) 7.5-325 MG tablet. Pt would like to pick this up by Friday 05/26/15. Thanks TNP

## 2015-05-24 NOTE — Telephone Encounter (Signed)
Patient advised as directed below. 

## 2015-05-25 ENCOUNTER — Other Ambulatory Visit: Payer: Self-pay | Admitting: Family Medicine

## 2015-06-12 ENCOUNTER — Other Ambulatory Visit: Payer: Self-pay | Admitting: Family Medicine

## 2015-06-12 DIAGNOSIS — M545 Low back pain, unspecified: Secondary | ICD-10-CM

## 2015-06-12 DIAGNOSIS — G8929 Other chronic pain: Secondary | ICD-10-CM

## 2015-06-12 MED ORDER — HYDROCODONE-ACETAMINOPHEN 7.5-325 MG PO TABS: 1.0000 | ORAL_TABLET | Freq: Three times a day (TID) | ORAL | Status: DC | PRN

## 2015-06-12 NOTE — Telephone Encounter (Signed)
Printed prescription at the front desk for pick up.

## 2015-06-12 NOTE — Telephone Encounter (Signed)
Pt needs refill HYDROcodone-acetaminophen (NORCO) 7.5-325 MG tablet 05/23/15 -- Jodell Cipro Chrismon, PA Take 1 tablet by mouth 3 (three) times daily as needed.  Thanks Barth Kirks

## 2015-06-13 NOTE — Telephone Encounter (Signed)
Patient advised RX is at the front desk for pickup.  

## 2015-07-03 ENCOUNTER — Other Ambulatory Visit: Payer: Self-pay | Admitting: Family Medicine

## 2015-07-03 DIAGNOSIS — M545 Low back pain, unspecified: Secondary | ICD-10-CM

## 2015-07-03 DIAGNOSIS — G8929 Other chronic pain: Secondary | ICD-10-CM

## 2015-07-03 MED ORDER — HYDROCODONE-ACETAMINOPHEN 7.5-325 MG PO TABS: 1.0000 | ORAL_TABLET | Freq: Three times a day (TID) | ORAL | Status: DC | PRN

## 2015-07-03 NOTE — Telephone Encounter (Signed)
Patient advised RX is ready for pick up.  

## 2015-07-03 NOTE — Telephone Encounter (Signed)
Pt contacted office for refill request on the following medications: HYDROcodone-acetaminophen (NORCO) 7.5-325 MG tablet. Pt stated he has enough to last until Wednesday at 12 p.m. Pt would like to pick up the RX tomorrow if possible. Thanks TNP

## 2015-07-03 NOTE — Telephone Encounter (Signed)
Refill written prescription at front desk file for pick up. Keep appointment scheduled 08-14-15.

## 2015-07-24 ENCOUNTER — Other Ambulatory Visit: Payer: Self-pay | Admitting: Family Medicine

## 2015-07-24 DIAGNOSIS — M545 Low back pain, unspecified: Secondary | ICD-10-CM

## 2015-07-24 DIAGNOSIS — G8929 Other chronic pain: Secondary | ICD-10-CM

## 2015-07-24 NOTE — Telephone Encounter (Signed)
Pt contacted office for refill request on the following medications:  HYDROcodone-acetaminophen (NORCO) 7.5-325 MG tablet.  CB#[phone]/MW °

## 2015-07-24 NOTE — Telephone Encounter (Signed)
Please review-aa 

## 2015-07-25 MED ORDER — HYDROCODONE-ACETAMINOPHEN 7.5-325 MG PO TABS: 1.0000 | ORAL_TABLET | Freq: Three times a day (TID) | ORAL | Status: DC | PRN

## 2015-07-25 NOTE — Telephone Encounter (Signed)
Patient advised as directed below. 

## 2015-07-25 NOTE — Telephone Encounter (Signed)
Prescription printed and at the front desk for pick up. Keep follow up appointment on 08-14-15.

## 2015-08-03 ENCOUNTER — Encounter: Payer: Self-pay | Admitting: Family Medicine

## 2015-08-03 ENCOUNTER — Ambulatory Visit (INDEPENDENT_AMBULATORY_CARE_PROVIDER_SITE_OTHER): Payer: 59 | Admitting: Family Medicine

## 2015-08-03 VITALS — BP 114/82 | HR 74 | Temp 97.7°F | Resp 14 | Wt 199.8 lb

## 2015-08-03 DIAGNOSIS — W540XXA Bitten by dog, initial encounter: Secondary | ICD-10-CM

## 2015-08-03 DIAGNOSIS — M545 Low back pain, unspecified: Secondary | ICD-10-CM

## 2015-08-03 DIAGNOSIS — F064 Anxiety disorder due to known physiological condition: Secondary | ICD-10-CM | POA: Diagnosis not present

## 2015-08-03 DIAGNOSIS — G8929 Other chronic pain: Secondary | ICD-10-CM | POA: Diagnosis not present

## 2015-08-03 DIAGNOSIS — L089 Local infection of the skin and subcutaneous tissue, unspecified: Secondary | ICD-10-CM

## 2015-08-03 DIAGNOSIS — Z13220 Encounter for screening for lipoid disorders: Secondary | ICD-10-CM | POA: Diagnosis not present

## 2015-08-03 MED ORDER — CIPROFLOXACIN HCL 500 MG PO TABS: 500.0000 mg | ORAL_TABLET | Freq: Two times a day (BID) | ORAL | Status: DC

## 2015-08-03 NOTE — Patient Instructions (Signed)
Animal Bite Animal bites can range from mild to serious. An animal bite can result in a scratch on the skin, a deep open cut, a puncture of the skin, a crush injury, or tearing away of the skin or a body part. A small bite from a house pet will usually not cause serious problems. However, some animal bites can become infected or injure a bone or other tissue.  Bites from certain animals can be more dangerous because of the risk of spreading rabies, which is a serious viral infection. This risk is higher with bites from stray animals or wild animals, such as raccoons, foxes, skunks, and bats. Dogs are responsible for most animal bites. Children are bitten more often than adults. SYMPTOMS  Common symptoms of an animal bite include:   Pain.   Bleeding.   Swelling.   Bruising.  DIAGNOSIS  This condition may be diagnosed based on a physical exam and medical history. Your health care provider will examine the wound and ask for details about the animal and how the bite happened. You may also have tests, such as:   Blood tests to check for infection or to determine if surgery is needed.  X-rays to check for damage to bones or joints.  Culture test. This uses a sample of fluid from the wound to check for infection. TREATMENT  Treatment varies depending on the location and type of animal bite and your medical history. Treatment may include:   Wound care. This often includes cleaning the wound, flushing the wound with saline solution, and applying a bandage (dressing). Sometimes, the wound is left open to heal because of the high risk of infection. However, in some cases, the wound may be closed with stitches (sutures), staples, skin glue, or adhesive strips.   Antibiotic medicine.   Tetanus shot.   Rabies treatment if the animal could have rabies.  In some cases, bites that have become infected may require IV antibiotics and surgical treatment in the hospital.  HOME CARE  INSTRUCTIONS Wound Care  Follow instructions from your health care provider about how to take care of your wound. Make sure you:  Wash your hands with soap and water before you change your dressing. If soap and water are not available, use hand sanitizer.  Change your dressing as told by your health care provider.  Leave sutures, skin glue, or adhesive strips in place. These skin closures may need to be in place for 2 weeks or longer. If adhesive strip edges start to loosen and curl up, you may trim the loose edges. Do not remove adhesive strips completely unless your health care provider tells you to do that.  Check your wound every day for signs of infection. Watch for:   Increasing redness, swelling, or pain.   Fluid, blood, or pus.  General Instructions  Take or apply over-the-counter and prescription medicines only as told by your health care provider.   If you were prescribed an antibiotic, take or apply it as told by your health care provider. Do not stop using the antibiotic even if your condition improves.   Keep the injured area raised (elevated) above the level of your heart while you are sitting or lying down, if this is possible.   If directed, apply ice to the injured area.   Put ice in a plastic bag.   Place a towel between your skin and the bag.   Leave the ice on for 20 minutes, 2-3 times per day.     Keep all follow-up visits as told by your health care provider. This is important.  SEEK MEDICAL CARE IF:  You have increasing redness, swelling, or pain at the site of your wound.   You have a general feeling of sickness (malaise).   You feel nauseous or you vomit.   You have pain that does not get better.  SEEK IMMEDIATE MEDICAL CARE IF:  You have a red streak extending away from your wound.   You have fluid, blood, or pus coming from your wound.   You have a fever or chills.   You have trouble moving your injured area.   You  have numbness or tingling extending beyond the wound.   This information is not intended to replace advice given to you by your health care provider. Make sure you discuss any questions you have with your health care provider.   Document Released: 12/25/2010 Document Revised: 12/28/2014 Document Reviewed: 08/24/2014 Elsevier Interactive Patient Education 2016 [employer] Inc.  

## 2015-08-03 NOTE — Progress Notes (Signed)
Patient ID: David Reeves, male   DOB: 05/26/1970, 45 y.o.   MRN: [id number]   Patient: David Reeves Male    DOB: 05/26/1970   45 y.o.   MRN: [id number] Visit Date: 08/03/2015  Today's Provider: Dortha Kernennis Chrismon, PA   Chief Complaint  Patient presents with  . Animal Bite   Subjective:    Animal Bite  Episode onset: 07/22/2015. The incident occurred at another residence. Head/neck injury location: right lower leg. There is an injury to the right lower leg. The patient is experiencing no pain. His tetanus status is UTD. He has received no recent medical care.    Patient Active Problem List   Diagnosis Date Noted  . Chronic low back pain 11/08/2014  . Hx of hemorrhoids 11/08/2014  . AAA (abdominal aortic aneurysm) (HCC) 09/22/2014  . Chronic pain associated with significant psychosocial dysfunction 09/22/2014  . Panic attack 09/22/2014  . AB (asthmatic bronchitis) 08/17/2014  . Anxiety 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 08/17/2014  . Backache 08/17/2014  . Lumbosacral spondylosis without myelopathy 08/17/2014  . 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 Brash 08/17/2014  . LBP (low back pain) 08/17/2014  . Tendon nodule 08/17/2014  . Episodic paroxysmal anxiety disorder 08/17/2014  . Hernia, inguinal, right 08/17/2014  . Fast heart beat 08/17/2014  . Illness 08/17/2014  . Inguinal hernia 10/14/2012   Past Surgical History  Procedure Laterality Date  . Toe surgery Right 2007  . Hernia repair Left 2006,2014    [facility]    Family History  Problem Relation Age of Onset  . Diabetes Mother   . Diabetes Maternal Uncle   . Diabetes Maternal Grandmother   . Heart disease Maternal Grandmother     Previous Medications   CELECOXIB (CELEBREX) 200 MG CAPSULE    Take 1 capsule by mouth  twice daily as needed   CLONAZEPAM (KLONOPIN) 1 MG TABLET    Take 1 tablet (1 mg total) by mouth every 8 (eight) hours.   HYDROCODONE-ACETAMINOPHEN (NORCO) 7.5-325 MG TABLET    Take 1  tablet by mouth 3 (three) times daily as needed.   TIZANIDINE (ZANAFLEX) 4 MG TABLET    Take 1 tablet by mouth 3  times daily   Allergies  Allergen Reactions  . Duloxetine Hcl   . Penicillins Other (See Comments)    Unknown -- childhood reaction    Review of Systems  Constitutional: Negative.   HENT: Negative.   Eyes: Negative.   Respiratory: Negative.   Cardiovascular: Negative.   Gastrointestinal: Negative.   Endocrine: Negative.   Genitourinary: Negative.   Musculoskeletal: Negative.   Skin: Negative.   Allergic/Immunologic: Negative.   Neurological: Negative.   Hematological: Negative.   Psychiatric/Behavioral: Negative.     Social History  Substance Use Topics  . Smoking status: Never Smoker   . Smokeless tobacco: Never Used  . Alcohol Use: No   Objective:   BP 114/82 mmHg  Pulse 74  Temp(Src) 97.7 F (36.5 C) (Oral)  Resp 14  Wt 199 lb 12.8 oz (90.629 kg)  Physical Exam  Constitutional: He is oriented to person, place, and time. He appears well-developed and well-nourished. No distress.  HENT:  Head: Normocephalic and atraumatic.  Right Ear: Hearing normal.  Left Ear: Hearing normal.  Nose: Nose normal.  Eyes: Conjunctivae and lids are normal. Right eye exhibits no discharge. Left eye exhibits no discharge. No scleral icterus.  Neck: Neck supple.  Cardiovascular: Normal rate and regular rhythm.   Pulmonary/Chest:  Effort normal and breath sounds normal. No respiratory distress.  Musculoskeletal:  Persistent chronic low back pain.  Neurological: He is alert and oriented to person, place, and time.  Skin: Skin is intact. No lesion and no rash noted.  2 - 2.5 cm x 0.5 cm with surrounding induration and erythema. Black wrinkled edges with serous and purulent drainage. Several scratches and one puncture wound superior to the larger wound on the right lower leg laterally. No local lymphadenopathy.  Psychiatric: He has a normal mood and affect. His speech is  normal and behavior is normal. Thought content normal.      Assessment & Plan:     1. Infected dog bite Onset 07-22-15 at a friend's house. Has had previous contact with this dog and did not feel he was aggressive. States dog's immunizations are up to date. Has tried to clean the wound but it is slow to heal. Last tetanus booster was 12-14-09. Will culture wound and get CBC. Start Cipro since he is allergic to PCN. Advised he should have contacted animal control. Recheck in 4 days. - CBC with Differential/Platelet - Wound culture - ciprofloxacin (CIPRO) 500 MG tablet; Take 1 tablet (500 mg total) by mouth 2 (two) times daily.  Dispense: 20 tablet; Refill: 0  2. Chronic low back pain Continues to have severe low back pain daily. Uses Celebrex, heating pad, back brace, Zanaflex and Norco to maintain control of pain. Tries to do some stretching exercises daily. Can't sit or stand very long due to escalation of pain and needing to change positions. This regimen helps control pain and allow him to ambulate some. Will get routine follow up labs and recheck pending reports. States he wants to keep medication to a minimum and not go back to pain management clinic because they had him "on more and stronger meds" in the past. - Comprehensive metabolic panel  3. Anxiety 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 Tolerating Klonopin and sleep fairly well. Episodes of panic are fewer with regular use of Klonopin 1 mg q 8 hours. Will recheck labs and continue the present regimen. - TSH  4. Screening cholesterol level - Lipid panel

## 2015-08-04 ENCOUNTER — Telehealth: Payer: Self-pay

## 2015-08-04 LAB — COMPREHENSIVE METABOLIC PANEL
ALT: 16 IU/L (ref 0–44)
AST: 14 IU/L (ref 0–40)
Albumin/Globulin Ratio: 1.9 (ref 1.2–2.2)
Albumin: 4.7 g/dL (ref 3.5–5.5)
Alkaline Phosphatase: 63 IU/L (ref 39–117)
BUN/Creatinine Ratio: 12 (ref 9–20)
BUN: 11 mg/dL (ref 6–24)
Bilirubin Total: 0.6 mg/dL (ref 0.0–1.2)
CO2: 23 mmol/L (ref 18–29)
Calcium: 9.6 mg/dL (ref 8.7–10.2)
Chloride: 101 mmol/L (ref 96–106)
Creatinine, Ser: 0.9 mg/dL (ref 0.76–1.27)
GFR calc Af Amer: 119 mL/min/{1.73_m2} (ref >59)
GFR calc non Af Amer: 103 mL/min/{1.73_m2} (ref >59)
Globulin, Total: 2.5 g/dL (ref 1.5–4.5)
Glucose: 102 mg/dL — ABNORMAL HIGH (ref 65–99)
Potassium: 3.9 mmol/L (ref 3.5–5.2)
Sodium: 142 mmol/L (ref 134–144)
Total Protein: 7.2 g/dL (ref 6.0–8.5)

## 2015-08-04 LAB — CBC WITH DIFFERENTIAL/PLATELET
Basophils Absolute: 0 10*3/uL (ref 0.0–0.2)
Basos: 0 %
EOS (ABSOLUTE): 0.1 10*3/uL (ref 0.0–0.4)
Eos: 1 %
Hematocrit: 45.1 % (ref 37.5–51.0)
Hemoglobin: 14.8 g/dL (ref 12.6–17.7)
Immature Grans (Abs): 0 10*3/uL (ref 0.0–0.1)
Immature Granulocytes: 0 %
Lymphocytes Absolute: 2.1 10*3/uL (ref 0.7–3.1)
Lymphs: 30 %
MCH: 32 pg (ref 26.6–33.0)
MCHC: 32.8 g/dL (ref 31.5–35.7)
MCV: 97 fL (ref 79–97)
Monocytes Absolute: 0.6 10*3/uL (ref 0.1–0.9)
Monocytes: 8 %
Neutrophils Absolute: 4.3 10*3/uL (ref 1.4–7.0)
Neutrophils: 61 %
Platelets: 303 10*3/uL (ref 150–379)
RBC: 4.63 x10E6/uL (ref 4.14–5.80)
RDW: 12.7 % (ref 12.3–15.4)
WBC: 7.1 10*3/uL (ref 3.4–10.8)

## 2015-08-04 LAB — LIPID PANEL
Chol/HDL Ratio: 4.4 ratio units (ref 0.0–5.0)
Cholesterol, Total: 208 mg/dL — ABNORMAL HIGH (ref 100–199)
HDL: 47 mg/dL (ref >39)
LDL Calculated: 145 mg/dL — ABNORMAL HIGH (ref 0–99)
Triglycerides: 79 mg/dL (ref 0–149)
VLDL Cholesterol Cal: 16 mg/dL (ref 5–40)

## 2015-08-04 LAB — TSH: TSH: 2.03 u[IU]/mL (ref 0.450–4.500)

## 2015-08-04 NOTE — Telephone Encounter (Signed)
-----   Message from Tamsen Roersennis E Chrismon, [place] sent at 08/04/2015  8:12 AM EDT ----- LDL cholesterol still elevated but better than 3 years ago. Goal is <100. Recommend low fat diet and Red Yeast Rice daily. Recheck lipids in 3 months to see if additional medication needed to bring this down. Remainder of blood tests are normal. Still waiting on wound culture report.

## 2015-08-04 NOTE — Telephone Encounter (Signed)
Patient advised as directed below. Patient verbalized understanding.  

## 2015-08-07 ENCOUNTER — Ambulatory Visit: Payer: 59 | Admitting: Family Medicine

## 2015-08-07 ENCOUNTER — Telehealth: Payer: Self-pay

## 2015-08-07 LAB — WOUND CULTURE

## 2015-08-07 LAB — PLEASE NOTE

## 2015-08-07 NOTE — Telephone Encounter (Signed)
LMTCB

## 2015-08-07 NOTE — Telephone Encounter (Signed)
-----   Message from Tamsen Roersennis E Chrismon, [place] sent at 08/06/2015 10:28 PM EDT ----- Preliminary culture report showed some streptococcus bacteria in wound. Continue present antibiotic and awaiting final report.

## 2015-08-08 NOTE — Telephone Encounter (Signed)
-----   Message from Tamsen Roersennis E Chrismon, [place] sent at 08/07/2015  5:51 PM EDT ----- Culture also isolated Citrobacter braaki with the Streptococcus in wound. It is sensitive to the Ciprofloxacin given. Finish all the antibiotic and recheck as planned.

## 2015-08-08 NOTE — Telephone Encounter (Signed)
Acknowledged. Recheck wound prn.

## 2015-08-08 NOTE — Telephone Encounter (Signed)
Pt returned call. Thanks TNP °

## 2015-08-08 NOTE — Telephone Encounter (Signed)
Patient advised as directed below. Patient verbalized understanding. Patient was scheduled for a follow up appointment on 08/07/2015, but patient canceled and reports wound is looking better and scabbed over.

## 2015-08-10 ENCOUNTER — Other Ambulatory Visit: Payer: Self-pay | Admitting: Family Medicine

## 2015-08-10 ENCOUNTER — Other Ambulatory Visit: Payer: Self-pay

## 2015-08-10 DIAGNOSIS — M545 Low back pain, unspecified: Secondary | ICD-10-CM

## 2015-08-10 DIAGNOSIS — G8929 Other chronic pain: Secondary | ICD-10-CM

## 2015-08-10 MED ORDER — HYDROCODONE-ACETAMINOPHEN 7.5-325 MG PO TABS: 1.0000 | ORAL_TABLET | Freq: Three times a day (TID) | ORAL | Status: DC | PRN

## 2015-08-10 NOTE — Telephone Encounter (Signed)
Patient advised RX is at front desk for pick up.  

## 2015-08-10 NOTE — Telephone Encounter (Signed)
Refill printed for pick up at the front desk file.

## 2015-08-11 ENCOUNTER — Other Ambulatory Visit: Payer: Self-pay

## 2015-08-11 MED ORDER — TIZANIDINE HCL 4 MG PO TABS: ORAL_TABLET | ORAL | Status: DC

## 2015-08-11 NOTE — Telephone Encounter (Signed)
Patient called wanting to get refill for Tizanidine to [facility], he states his insurance will not pay for local pharmacy. He uses this medication for muscle spasms. Please review-aa

## 2015-08-14 ENCOUNTER — Encounter: Payer: Self-pay | Admitting: Family Medicine

## 2015-08-31 ENCOUNTER — Telehealth: Payer: Self-pay | Admitting: Family Medicine

## 2015-08-31 DIAGNOSIS — M545 Low back pain, unspecified: Secondary | ICD-10-CM

## 2015-08-31 DIAGNOSIS — G8929 Other chronic pain: Secondary | ICD-10-CM

## 2015-08-31 NOTE — Telephone Encounter (Signed)
Pt needs refill HYDROcodone-acetaminophen (NORCO) 7.5-325 MG tablet  Please call when ready [phone]  Thanks, teri

## 2015-09-01 MED ORDER — HYDROCODONE-ACETAMINOPHEN 7.5-325 MG PO TABS: 1.0000 | ORAL_TABLET | Freq: Three times a day (TID) | ORAL | Status: DC | PRN

## 2015-09-01 NOTE — Telephone Encounter (Signed)
Advised patient

## 2015-09-01 NOTE — Telephone Encounter (Signed)
Advise patient prescription printed for pick up.

## 2015-09-21 ENCOUNTER — Other Ambulatory Visit: Payer: Self-pay | Admitting: Family Medicine

## 2015-09-21 DIAGNOSIS — M545 Low back pain, unspecified: Secondary | ICD-10-CM

## 2015-09-21 DIAGNOSIS — G8929 Other chronic pain: Secondary | ICD-10-CM

## 2015-09-21 MED ORDER — HYDROCODONE-ACETAMINOPHEN 7.5-325 MG PO TABS: 1.0000 | ORAL_TABLET | Freq: Three times a day (TID) | ORAL | Status: DC | PRN

## 2015-09-21 NOTE — Telephone Encounter (Signed)
Pt contacted office for refill request on the following medications:  HYDROcodone-acetaminophen (NORCO) 7.5-325 MG tablet.  CB#[phone]  Pt will be out of medication on Saturday.  This is a pt of [name].

## 2015-09-21 NOTE — Telephone Encounter (Signed)
Ok for 1 month?

## 2015-09-21 NOTE — Telephone Encounter (Signed)
rx printed and placed up front and pt advised-aa

## 2015-09-21 NOTE — Telephone Encounter (Signed)
Please review-aa 

## 2015-09-29 ENCOUNTER — Telehealth: Payer: Self-pay | Admitting: Family Medicine

## 2015-09-29 DIAGNOSIS — F064 Anxiety disorder due to known physiological condition: Secondary | ICD-10-CM

## 2015-09-29 DIAGNOSIS — F410 Panic disorder [episodic paroxysmal anxiety] without agoraphobia: Principal | ICD-10-CM

## 2015-09-29 NOTE — Telephone Encounter (Signed)
Pt was in for a dog bite and was prescribed cipro.  He thinks the cipro caused him to have several side effects.  Pressure in head, constipation, psychotic episodes.  Finished antibiotic.  He thinks the side effects could be semipermanent Call back is [phone]  [facility]

## 2015-09-29 NOTE — Telephone Encounter (Signed)
Had taken Cipro 2 months ago and has noticed anxiety and "stress" symptoms with constipation and head pressure couple times a week. Worried and anxious about this being a permanent issue. Further discussion reveals he is anxious about someone coming into his home and moving things around. Has purchased a surveillance system and no longer hearing noises in the house or seeing items moved. Admits this may have been just an increase in his anxiety level because he is feeling better with this phone call. Does not want to add any more medications and just wanted to document he "never" wants to be given the "Cipro" again.

## 2015-09-29 NOTE — Telephone Encounter (Signed)
Please review. Thanks!  

## 2015-10-02 NOTE — Telephone Encounter (Signed)
Pt called again today and stated he has attacks that last daily about 4 hours at a time.  He is stating the medication clonazePAM (KLONOPIN) 1 MG tablet is helping but only in the afternoon.  He stated if he needs to be referred to psychologist [place] [facility] [place].

## 2015-10-02 NOTE — Telephone Encounter (Signed)
Panic attacks becoming more recurrent and persistent. Recommend referral to psychiatrist ([facility] Will schedule appointment.

## 2015-10-06 ENCOUNTER — Telehealth: Payer: Self-pay | Admitting: Family Medicine

## 2015-10-06 NOTE — Telephone Encounter (Signed)
Pt states he [place] a [place] in [place], Mr. Toni ArthursFuller.  Pt is has no mental health changes and only has two weeks of medication left.    Pt would like a call back to speak to someone about what is going on [phone]

## 2015-10-06 NOTE — Telephone Encounter (Signed)
Please refer patient to psychiatrist in [place]. Thanks!

## 2015-10-06 NOTE — Telephone Encounter (Signed)
Advise patient we are trying to arrange psychiatry referral according to who is on the network list for his insurance. That way he will get the best coverage on the cost. Can get Maralyn SagoSarah to check to see if Dr. Toni ArthursFuller is on the list. Will try to schedule as soon as we can.

## 2015-10-06 NOTE — Telephone Encounter (Signed)
Pt called back to request a refill on his clonazePAM (KLONOPIN) 1 MG tablet. Pt stated he still has 2 weeks of meds but wanted to go ahead and request the refill. Pt also stated that Mr Toni ArthursFuller is not going to work b/c he is specializes in children. Pt would like to get a referral to find someone in his network and would like to go somewhere [place]. Please advise. Thanks TNP

## 2015-10-11 ENCOUNTER — Other Ambulatory Visit: Payer: Self-pay | Admitting: Family Medicine

## 2015-10-11 DIAGNOSIS — G8929 Other chronic pain: Secondary | ICD-10-CM

## 2015-10-11 DIAGNOSIS — M545 Low back pain, unspecified: Secondary | ICD-10-CM

## 2015-10-11 NOTE — Telephone Encounter (Signed)
Please review. Thanks!  

## 2015-10-11 NOTE — Telephone Encounter (Signed)
Appointment made with Dr Gwyneth SproutGeorge Reeves (Phone [phone]) 10/31/15 at 2:00.Address [address] [place] [id number]

## 2015-10-11 NOTE — Telephone Encounter (Signed)
Pt needs refill HYDROcodone-acetaminophen (NORCO) 7.5-325 MG tablet 09/21/15 -- David Hudsonichard L Gilbert Jr., David Reeves Take 1 tablet by mouth 3 (three) times daily as needed.  Please call when ready   Thanks, teri

## 2015-10-13 MED ORDER — HYDROCODONE-ACETAMINOPHEN 7.5-325 MG PO TABS: 1.0000 | ORAL_TABLET | Freq: Three times a day (TID) | ORAL | Status: DC | PRN

## 2015-10-13 NOTE — Telephone Encounter (Signed)
Advise patient refill printed and at the front desk for pick up. Has an appointment for follow up on 10-19-15.

## 2015-10-16 ENCOUNTER — Telehealth: Payer: Self-pay | Admitting: Family Medicine

## 2015-10-16 NOTE — Telephone Encounter (Signed)
Patient reports that his rash comes and goes. He reports that he can not tell if it comes from the Cipro or the Celebrex. Patient reports that he has not taken Celebrex in the last 2 days to see if the rash would clear up. Patient reports that sometimes the rash would blister, and sometimes it wouldn't. Patient also mentions that the rash is very itchy and spreads from his lower abdomen to his knees.   I recommended to patient that he should come in for evaluation, but patient declined. Patient reports that he can not drive while he has all these symptoms. Patient reports that his mood has also changed tremendously and thinks Cipro did this to him permanently. Patient reports that he just wants to feel "normal" again, and is debating if he should take ANY of his medications because he feels his body is reacting to everything. Patient reports that he has been very anxious and feels Cipro has done this to him. Do you have any suggestions that patient can take or try OTC to help with symptoms? (also, forgot to mention that patient had a 3 month FU appt with you on 6/29 but cancelled because he says he doesn't feel safe to drive).

## 2015-10-16 NOTE — Telephone Encounter (Signed)
Left message at his home number to call back as needed. He could get some help with itching rash by using Benadryl 25 mg TID if needed. Difficult to imagine the Cipro is still affecting him since he has been off it for nearly 3 months now. Should symptoms worsen, someone needs to see the rash to make the best decision about what may be causing it. Prefer he keep the appointment Thursday for follow up visit.

## 2015-10-16 NOTE — Telephone Encounter (Signed)
Left message to call back  

## 2015-10-16 NOTE — Telephone Encounter (Signed)
Pt states that the blistering rash that he was having was coming from the Cipro that he was taking.He has decided that he will not be coming back in office.He states that if there needs to be a change in any of his meds send to [facility]

## 2015-10-16 NOTE — Telephone Encounter (Signed)
Pt returned call

## 2015-10-18 NOTE — Telephone Encounter (Signed)
Patient was advised.  

## 2015-10-19 ENCOUNTER — Ambulatory Visit: Payer: Self-pay | Admitting: Family Medicine

## 2015-10-20 ENCOUNTER — Telehealth: Payer: Self-pay | Admitting: Family Medicine

## 2015-10-20 DIAGNOSIS — Z0000 Encounter for general adult medical examination without abnormal findings: Secondary | ICD-10-CM

## 2015-10-20 DIAGNOSIS — R634 Abnormal weight loss: Secondary | ICD-10-CM

## 2015-10-20 NOTE — Telephone Encounter (Signed)
Patient called about concern for panic attacks and constipation. Still feels the use of Cipro in April 2017 caused flare of anxiety and a "blistering rash" intermittently. Wants to taper off his Klonopin and Celebrex. Has lost weight down to around 175 from his usual of 200-210. Wants to get routine labs to complete health screening form from [employer] insurance provider (wife's place of employment). Will place future order in chart to be printed when he comes by for blood draw down stairs. Has an appointment scheduled for next week.

## 2015-10-23 ENCOUNTER — Ambulatory Visit: Payer: Self-pay | Admitting: Family Medicine

## 2015-10-23 ENCOUNTER — Ambulatory Visit (INDEPENDENT_AMBULATORY_CARE_PROVIDER_SITE_OTHER): Payer: 59 | Admitting: Family Medicine

## 2015-10-23 ENCOUNTER — Encounter: Payer: Self-pay | Admitting: Family Medicine

## 2015-10-23 VITALS — BP 118/72 | HR 80 | Temp 98.6°F | Resp 16 | Wt 184.0 lb

## 2015-10-23 DIAGNOSIS — F064 Anxiety disorder due to known physiological condition: Secondary | ICD-10-CM | POA: Diagnosis not present

## 2015-10-23 DIAGNOSIS — Z131 Encounter for screening for diabetes mellitus: Secondary | ICD-10-CM | POA: Diagnosis not present

## 2015-10-23 DIAGNOSIS — L509 Urticaria, unspecified: Secondary | ICD-10-CM | POA: Diagnosis not present

## 2015-10-23 DIAGNOSIS — G8929 Other chronic pain: Secondary | ICD-10-CM | POA: Diagnosis not present

## 2015-10-23 DIAGNOSIS — M545 Low back pain, unspecified: Secondary | ICD-10-CM

## 2015-10-23 LAB — POCT GLYCOSYLATED HEMOGLOBIN (HGB A1C)
Est. average glucose Bld gHb Est-mCnc: 108
Hemoglobin A1C: 5.4

## 2015-10-23 MED ORDER — CLONAZEPAM 1 MG PO TABS: 1.0000 mg | ORAL_TABLET | Freq: Three times a day (TID) | ORAL | Status: DC

## 2015-10-23 NOTE — Progress Notes (Signed)
Patient: David Reeves Male    DOB: 01/19/1971   44 y.o.   MRN: [id number] Visit Date: 10/23/2015  Today's Provider: Dortha Kernennis Chrismon, PA   Chief Complaint  Patient presents with  . Hyperglycemia   Subjective:    HPI  Concerned about family history of diabetes and blood sugars have ranged from 75 to 150 without hypoglycemic episodes. Still having low back pain with some radiation into the right leg occasionally with some radiculopathy. Still using Norco and Zanaflex TID. Anxiety with paranoid tendencies over the past couple months after a reaction to Cipro. Occasionally has some hives (last episode was 10-14-15). Benadryl helping to relieve itching and clear hives.  Patient Active Problem List   Diagnosis Date Noted  . Chronic low back pain 11/08/2014  . Hx of hemorrhoids 11/08/2014  . AAA (abdominal aortic aneurysm) (HCC) 09/22/2014  . Chronic pain associated with significant psychosocial dysfunction 09/22/2014  . Panic attack 09/22/2014  . AB (asthmatic bronchitis) 08/17/2014  . Anxiety 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 08/17/2014  . Backache 08/17/2014  . Lumbosacral spondylosis without myelopathy 08/17/2014  . 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 Brash 08/17/2014  . LBP (low back pain) 08/17/2014  . Tendon nodule 08/17/2014  . Episodic paroxysmal anxiety disorder 08/17/2014  . Hernia, inguinal, right 08/17/2014  . Fast heart beat 08/17/2014  . Illness 08/17/2014  . Inguinal hernia 10/14/2012   Past Surgical History  Procedure Laterality Date  . Toe surgery Right 2007  . Hernia repair Left 2006,2014    [facility]   Family History  Problem Relation Age of Onset  . Diabetes Mother   . Diabetes Maternal Uncle   . Diabetes Maternal Grandmother   . Heart disease Maternal Grandmother    Allergies  Allergen Reactions  . Duloxetine Hcl   . Penicillins Other (See Comments)    Unknown -- childhood reaction   Current Meds  Medication Sig  .  celecoxib (CELEBREX) 200 MG capsule Take 1 capsule by mouth  twice daily as needed  . clonazePAM (KLONOPIN) 1 MG tablet Take 1 tablet (1 mg total) by mouth every 8 (eight) hours.  Marland Kitchen. HYDROcodone-acetaminophen (NORCO) 7.5-325 MG tablet Take 1 tablet by mouth 3 (three) times daily as needed.  Marland Kitchen. tiZANidine (ZANAFLEX) 4 MG tablet Take 1 tablet by mouth 3  times daily    Review of Systems  Constitutional: Negative.   HENT: Negative for dental problem, facial swelling, hearing loss and trouble swallowing.   Eyes: Negative.   Respiratory: Negative.   Cardiovascular: Negative.   Gastrointestinal: Positive for constipation. Negative for vomiting, diarrhea and blood in stool.  Endocrine: Negative.        History of some decrease in urination when reacting to Cipro.  Skin:       Intermittent episodes of hives (brought pictures of whelps on hips and abdomen).  Neurological: Positive for numbness. Negative for dizziness and headaches.       Hot pins and needles sensation in right lower leg and foot occasionally.  Psychiatric/Behavioral: Positive for dysphoric mood. The patient is nervous/anxious.     Social History  Substance Use Topics  . Smoking status: Never Smoker   . Smokeless tobacco: Never Used  . Alcohol Use: No   Objective:   BP 118/72 mmHg  Pulse 80  Temp(Src) 98.6 F (37 C)  Resp 16  Wt 184 lb (83.462 kg)  SpO2 97% Body mass index is 24.79 kg/(m^2).  Wt Readings  from Last 3 Encounters:  10/23/15 184 lb (83.462 kg)  08/03/15 199 lb 12.8 oz (90.629 kg)  05/15/15 212 lb (96.163 kg)    Physical Exam  Constitutional: He is oriented to person, place, and time. He appears well-developed and well-nourished. No distress.  HENT:  Head: Normocephalic and atraumatic.  Right Ear: Hearing normal.  Left Ear: Hearing normal.  Nose: Nose normal.  Eyes: Conjunctivae and lids are normal. Right eye exhibits no discharge. Left eye exhibits no discharge. No scleral icterus.  Neck: Neck  supple.  Cardiovascular: Normal rate and regular rhythm.   Pulmonary/Chest: Effort normal and breath sounds normal. No respiratory distress.  Abdominal: Bowel sounds are normal.  Musculoskeletal: He exhibits tenderness.  Lower back with radiation into the right leg.  Neurological: He is alert and oriented to person, place, and time.  Skin: Skin is intact. No lesion and no rash noted.  Psychiatric: He has a normal mood and affect. His speech is normal and behavior is normal. Thought content normal.     Assessment & Plan:     1. Chronic low back pain Still having severe low back pain daily that escalates with remaining in one position to long (sitting, standing or lying down). Continue to get pain controlled from 7-8/10 to 3-4/10 with the use of Celebrex 200 mg BID, Norco 7.5-325 mg one TID and Zanaflex 4 mg TID. Uses lumbar supporter frequently and heating pad (which has cause a mottling of the skin in the lumbar region). Still adamant about limiting medications and afraid of any surgery (suggested by neurosurgeon in the past). Recommend follow up in 3 months.  2. Anxiety 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 Anxiety and panic secondary to chronic back pain. Some paranoia he associates with use of Cipro for Citrobacter braaki with Streptococcus in a dog bite wound in April 2017. Culture showed these bacteria were sensitive to the Cipro since he is allergic to Penicillin. Thinks he is hearing noises in his home as if some one has broken in and moved things around. Will refill Klonopin for panic. Declines SSRI at this time for fear of additional reactions. Declines psychiatry referral. - clonazePAM (KLONOPIN) 1 MG tablet; Take 1 tablet (1 mg total) by mouth every 8 (eight) hours.  Dispense: 90 tablet; Refill: 0  3. Urticaria Last episode was 10-14-15. Doubt this is due to Cipro given 3 months ago. 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 May use Benadryl prn as it has been helpful so far.  4.  Screening for diabetes mellitus (DM) Has been checking glucose at home and concerned about fluctuations from 75 - 150 at random times. Hgb A1C 5.4% today. Advised he is not diabetic and did not need to continue checking blood sugars. - POCT glycosylated hemoglobin (Hb A1C)       Dortha Kernennis Chrismon, PA  [facility]

## 2015-10-26 ENCOUNTER — Telehealth: Payer: Self-pay | Admitting: Family Medicine

## 2015-10-26 LAB — CBC WITH DIFFERENTIAL/PLATELET
Basophils Absolute: 0 10*3/uL (ref 0.0–0.2)
Basos: 0 %
EOS (ABSOLUTE): 0.1 10*3/uL (ref 0.0–0.4)
Eos: 1 %
Hematocrit: 42 % (ref 37.5–51.0)
Hemoglobin: 14.8 g/dL (ref 12.6–17.7)
Immature Grans (Abs): 0 10*3/uL (ref 0.0–0.1)
Immature Granulocytes: 0 %
Lymphocytes Absolute: 1.7 10*3/uL (ref 0.7–3.1)
Lymphs: 25 %
MCH: 32.5 pg (ref 26.6–33.0)
MCHC: 35.2 g/dL (ref 31.5–35.7)
MCV: 92 fL (ref 79–97)
Monocytes Absolute: 0.4 10*3/uL (ref 0.1–0.9)
Monocytes: 6 %
Neutrophils Absolute: 4.5 10*3/uL (ref 1.4–7.0)
Neutrophils: 68 %
Platelets: 311 10*3/uL (ref 150–379)
RBC: 4.56 x10E6/uL (ref 4.14–5.80)
RDW: 12.5 % (ref 12.3–15.4)
WBC: 6.7 10*3/uL (ref 3.4–10.8)

## 2015-10-26 NOTE — Telephone Encounter (Signed)
error 

## 2015-10-27 ENCOUNTER — Ambulatory Visit: Payer: 59 | Admitting: Family Medicine

## 2015-10-30 ENCOUNTER — Telehealth: Payer: Self-pay | Admitting: Family Medicine

## 2015-10-30 NOTE — Telephone Encounter (Signed)
Advised patient CBC and Hgb A1C in very good shape. Awaiting final metabolic panel report.

## 2015-10-30 NOTE — Telephone Encounter (Signed)
Pt would like results of labs done on Thursday.  He also would like to discuss his fasting sugars being 120 to 130.

## 2015-10-31 ENCOUNTER — Other Ambulatory Visit: Payer: Self-pay | Admitting: Family Medicine

## 2015-10-31 ENCOUNTER — Ambulatory Visit: Payer: 59 | Admitting: Family Medicine

## 2015-10-31 DIAGNOSIS — M545 Low back pain, unspecified: Secondary | ICD-10-CM

## 2015-10-31 DIAGNOSIS — G8929 Other chronic pain: Secondary | ICD-10-CM

## 2015-10-31 MED ORDER — HYDROCODONE-ACETAMINOPHEN 7.5-325 MG PO TABS: 1.0000 | ORAL_TABLET | Freq: Three times a day (TID) | ORAL | Status: DC | PRN

## 2015-10-31 NOTE — Telephone Encounter (Signed)
Please review. Thanks!  

## 2015-10-31 NOTE — Telephone Encounter (Signed)
Pt contacted office for refill request on the following medications:  HYDROcodone-acetaminophen (NORCO) 7.5-325 MG tablet.  CB#[phone]/MW °

## 2015-11-01 DIAGNOSIS — M48062 Spinal stenosis, lumbar region with neurogenic claudication: Secondary | ICD-10-CM | POA: Insufficient documentation

## 2015-11-02 ENCOUNTER — Telehealth: Payer: Self-pay | Admitting: Family Medicine

## 2015-11-02 ENCOUNTER — Ambulatory Visit (INDEPENDENT_AMBULATORY_CARE_PROVIDER_SITE_OTHER): Payer: 59 | Admitting: Family Medicine

## 2015-11-02 ENCOUNTER — Other Ambulatory Visit: Payer: Self-pay

## 2015-11-02 ENCOUNTER — Encounter: Payer: Self-pay | Admitting: Family Medicine

## 2015-11-02 VITALS — BP 122/68 | HR 80 | Temp 98.2°F | Resp 16 | Wt 184.0 lb

## 2015-11-02 DIAGNOSIS — M48062 Spinal stenosis, lumbar region with neurogenic claudication: Secondary | ICD-10-CM

## 2015-11-02 DIAGNOSIS — M4806 Spinal stenosis, lumbar region: Secondary | ICD-10-CM | POA: Diagnosis not present

## 2015-11-02 DIAGNOSIS — M4854XS Collapsed vertebra, not elsewhere classified, thoracic region, sequela of fracture: Secondary | ICD-10-CM

## 2015-11-02 DIAGNOSIS — S22080S Wedge compression fracture of T11-T12 vertebra, sequela: Secondary | ICD-10-CM

## 2015-11-02 DIAGNOSIS — Z0000 Encounter for general adult medical examination without abnormal findings: Secondary | ICD-10-CM

## 2015-11-02 DIAGNOSIS — F064 Anxiety disorder due to known physiological condition: Secondary | ICD-10-CM | POA: Diagnosis not present

## 2015-11-02 DIAGNOSIS — R634 Abnormal weight loss: Secondary | ICD-10-CM

## 2015-11-02 NOTE — Progress Notes (Signed)
Patient: David Reeves Male    DOB: Jul 03, 1970   45 y.o.   MRN: [id number] Visit Date: 11/02/2015  Today's Provider: [name], PA   Chief Complaint  Patient presents with  . Back Pain   Subjective:    HPI Patient comes in today wanting to discuss changing his pain medication. He is requesting an extended release medication for pain that will last throughout the day. Patient reports that he went to [facility] due to severe back pain, and reports that they scheduled him a follow up appt with neurosurgery for evaluation next week. Patient reports that there were no changes in medication.    Past Medical History  Diagnosis Date  . Back pain 2014  . Bulging disc 2014  . Osteoarthritis   . Degenerative disc disease   . Bone spur   . Panic anxiety syndrome   . Taking multiple medications for chronic disease    Past Surgical History  Procedure Laterality Date  . Toe surgery Right 2007  . Hernia repair Left 2006,2014    [facility]   Family History  Problem Relation Age of Onset  . Diabetes Mother   . Diabetes Maternal Uncle   . Diabetes Maternal Grandmother   . Heart disease Maternal Grandmother    Allergies  Allergen Reactions  . Amitriptyline Other (See Comments)    Urine retention  . Buspirone Other (See Comments)    Kidney pain  . Ciprofloxacin Other (See Comments)    hallucinations  . Doxepin Other (See Comments)    "Didn't feel right"  . Duloxetine Hcl Other (See Comments)    Sweating  . Escitalopram Other (See Comments)    Agitation  . Penicillins Other (See Comments)    Unknown -- childhood reaction  . Sertraline Other (See Comments)    Urine retention  . Trazodone Other (See Comments)    abd pain, urine retention   Current Meds  Medication Sig  . celecoxib (CELEBREX) 200 MG capsule Take 1 capsule by mouth  twice daily as needed  . clonazePAM (KLONOPIN) 1 MG tablet Take 1 tablet (1 mg total) by mouth every 8 (eight) hours.  Marland Kitchen.  HYDROcodone-acetaminophen (NORCO) 7.5-325 MG tablet Take 1 tablet by mouth 3 (three) times daily as needed.  Marland Kitchen. tiZANidine (ZANAFLEX) 4 MG tablet Take 1 tablet by mouth 3  times daily    Review of Systems  Constitutional: Negative.   Respiratory: Negative.   Cardiovascular: Negative.   Musculoskeletal: Positive for myalgias, back pain, arthralgias and gait problem.  Neurological: Negative for dizziness, tremors, seizures, syncope, facial asymmetry, speech difficulty, weakness, light-headedness, numbness and headaches.  Psychiatric/Behavioral: Positive for decreased concentration and agitation. Negative for suicidal ideas, hallucinations, sleep disturbance and self-injury. The patient is nervous/anxious. The patient is not hyperactive.     Social History  Substance Use Topics  . Smoking status: Never Smoker   . Smokeless tobacco: Never Used  . Alcohol Use: No   Objective:   BP 122/68 mmHg  Pulse 80  Temp(Src) 98.2 F (36.8 C)  Resp 16  Wt 184 lb (83.462 kg)  Physical Exam  Constitutional: He is oriented to person, place, and time. He appears well-developed and well-nourished. No distress.  HENT:  Head: Normocephalic and atraumatic.  Right Ear: Hearing normal.  Left Ear: Hearing normal.  Nose: Nose normal.  Eyes: Conjunctivae and lids are normal. Right eye exhibits no discharge. Left eye exhibits no discharge. No scleral icterus.  Neck: Neck  supple.  Cardiovascular: Normal rate and regular rhythm.   Pulmonary/Chest: Effort normal and breath sounds normal. No respiratory distress.  Abdominal: Soft.  Musculoskeletal: He exhibits tenderness.  Tender across lumbar spine worse with bending over or elevating left leg (SLR's). No numbness or muscle weakness.    Neurological: He is alert and oriented to person, place, and time.  Skin: Skin is intact. Rash noted. No lesion noted.  Brown mottling of skin across entire lumbar region for nearly constant heating pad use.  Psychiatric: He  has a normal mood and affect. His speech is normal and behavior is normal. Thought content normal.        Assessment & Plan:     1. Anxiety 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 Still having some anxiety with panic attacks. Clonazepam 1 mg TID continues to control symptoms and allow him to sleep. Still worried about side effects he was having in the spring after taking Cipro for an infected dog bite. Thinks it is still causing him to have hallucinations (auditory). Will continue present Clonazepam dosage. Has tried Trazodone, Escitalopram, Sertraline, Doxepin, Duloxetine, Amitriptyline and Buspirone with side effect reactions. Recommend psychiatry evaluation. States this has been scheduled when he went to [facility].  2. Spinal stenosis, lumbar region, with neurogenic claudication MRI on 10-31-15 showed T12 compression fracture, T12-L1 central disc herniation with mild canal stenosis and L4-5 central disc herniation with bilateral facet hypertrophy, central canal and foraminal stenosis. Finds pain is usually 7-8/10 that will come down to 2-3/10 with use of Norco 7.5/325 mg TID, Zanaflex 4 mg TID and Celebrex 200 mg BID. Uses back brace when walking or washes dishes. Uses heating pad nearly constantly.  3. T12 compression fracture, sequela Found on MRI as above. Has appointment to be evaluated by  Dr. Steele Berg (neurosurgeon) at [facility] on 11-09-15.        Dortha Kern, PA  [facility]

## 2015-11-02 NOTE — Telephone Encounter (Signed)
Pt stated that he was advised to call [facility] about the thyroid results. Pt stated that [facility] advised that he needed to contact our office b/c they faxed the results on 10/26/15 @ 4:38 pm. Please advise. Thanks TNP

## 2015-11-03 NOTE — Telephone Encounter (Signed)
Another lab slip was printed so that patient could have labs drawn. Per [facility], Lipid panel, TSH, Met C, HgbA1c was not on the requisition that was provided.

## 2015-11-06 ENCOUNTER — Other Ambulatory Visit: Payer: Self-pay | Admitting: Family Medicine

## 2015-11-06 DIAGNOSIS — F064 Anxiety disorder due to known physiological condition: Secondary | ICD-10-CM

## 2015-11-06 NOTE — Telephone Encounter (Signed)
Please review-aa 

## 2015-11-06 NOTE — Telephone Encounter (Signed)
Pt contacted office for refill request on the following medications:  [facility].  CB#[phone]/MW  clonazePAM (KLONOPIN) 1 MG tablet  tiZANidine (ZANAFLEX) 4 MG tablet

## 2015-11-07 MED ORDER — CLONAZEPAM 1 MG PO TABS: 1.0000 mg | ORAL_TABLET | Freq: Three times a day (TID) | ORAL | Status: DC

## 2015-11-07 NOTE — Telephone Encounter (Signed)
Called [facility] and they did not receive Rx for klonopin. Verbal authorization was done today as below.

## 2015-11-07 NOTE — Telephone Encounter (Signed)
Check with [facility] to see if written Klonopin refill printed on 10-23-15, was sent to them. If not give verbal authorization for refill Klonopin 1 mg TID #90.

## 2015-11-09 ENCOUNTER — Telehealth: Payer: Self-pay | Admitting: Family Medicine

## 2015-11-09 ENCOUNTER — Ambulatory Visit: Payer: 59 | Admitting: Family Medicine

## 2015-11-09 NOTE — Telephone Encounter (Signed)
Pt states he was seen [facility] today and he will not be having surgery.  Pt states he will be start being seen in the pain clinic.  Pt has questions about taking a different medication.  Pt asking for Maurine MinisterDennis to call him back.  CB#[phone]/MW

## 2015-11-10 NOTE — Telephone Encounter (Signed)
Wanted to notify us that the neurosurgeon did not feel he was a surgical candidate. Scheduled him for evaluation at their pain clinic. Patient seems to understand and agree with plan.

## 2015-11-14 LAB — COMPREHENSIVE METABOLIC PANEL
ALT: 40 IU/L (ref 0–44)
AST: 20 IU/L (ref 0–40)
Albumin/Globulin Ratio: 2 (ref 1.2–2.2)
Albumin: 4.4 g/dL (ref 3.5–5.5)
Alkaline Phosphatase: 46 IU/L (ref 39–117)
BUN/Creatinine Ratio: 16 (ref 9–20)
BUN: 15 mg/dL (ref 6–24)
Bilirubin Total: 0.5 mg/dL (ref 0.0–1.2)
CO2: 24 mmol/L (ref 18–29)
Calcium: 9.1 mg/dL (ref 8.7–10.2)
Chloride: 104 mmol/L (ref 96–106)
Creatinine, Ser: 0.91 mg/dL (ref 0.76–1.27)
GFR calc Af Amer: 117 mL/min/{1.73_m2} (ref >59)
GFR calc non Af Amer: 101 mL/min/{1.73_m2} (ref >59)
Globulin, Total: 2.2 g/dL (ref 1.5–4.5)
Glucose: 106 mg/dL — ABNORMAL HIGH (ref 65–99)
Potassium: 4.6 mmol/L (ref 3.5–5.2)
Sodium: 141 mmol/L (ref 134–144)
Total Protein: 6.6 g/dL (ref 6.0–8.5)

## 2015-11-14 LAB — LIPID PANEL
Chol/HDL Ratio: 4.3 ratio units (ref 0.0–5.0)
Cholesterol, Total: 167 mg/dL (ref 100–199)
HDL: 39 mg/dL — ABNORMAL LOW (ref >39)
LDL Calculated: 112 mg/dL — ABNORMAL HIGH (ref 0–99)
Triglycerides: 79 mg/dL (ref 0–149)
VLDL Cholesterol Cal: 16 mg/dL (ref 5–40)

## 2015-11-14 LAB — TSH: TSH: 1.12 u[IU]/mL (ref 0.450–4.500)

## 2015-11-14 LAB — HEMOGLOBIN A1C
Est. average glucose Bld gHb Est-mCnc: 103 mg/dL
Hgb A1c MFr Bld: 5.2 % (ref 4.8–5.6)

## 2015-11-16 ENCOUNTER — Telehealth: Payer: Self-pay | Admitting: Family Medicine

## 2015-11-16 NOTE — Telephone Encounter (Signed)
Pt states he is not getting pain pump put in.  He is requesting refill of clonazePAM (KLONOPIN) 1 MG tablet and HYDROcodone-acetaminophen (NORCO) 7.5-325 MG tablet .  Pt wanting to have records sent to [facility], I informed him he would need to sign a release of medical records.

## 2015-11-17 ENCOUNTER — Other Ambulatory Visit: Payer: Self-pay | Admitting: *Deleted

## 2015-11-17 DIAGNOSIS — M545 Low back pain, unspecified: Secondary | ICD-10-CM

## 2015-11-17 DIAGNOSIS — G8929 Other chronic pain: Secondary | ICD-10-CM

## 2015-11-17 MED ORDER — HYDROCODONE-ACETAMINOPHEN 7.5-325 MG PO TABS: 1.0000 | ORAL_TABLET | Freq: Three times a day (TID) | ORAL | 0 refills | Status: DC | PRN

## 2015-11-17 NOTE — Telephone Encounter (Signed)
Printed refill prescription for Hydrocodone today. Clonazepam was refilled on 11-07-15 and phoned into [facility]. Too soon for further refills.Marland Kitchen

## 2015-11-17 NOTE — Telephone Encounter (Signed)
Patient advised that prescription is ready for pick up and lab results read to patient.

## 2015-11-17 NOTE — Telephone Encounter (Signed)
Patient is requesting refill for HYDROcodone-acetaminophen (NORCO) 7.5-325 MG tablet. Also requesting lab results from 11/13/2015.

## 2015-11-20 ENCOUNTER — Encounter: Payer: Self-pay | Admitting: Family Medicine

## 2015-11-20 ENCOUNTER — Ambulatory Visit: Payer: Self-pay | Admitting: Family Medicine

## 2015-11-20 ENCOUNTER — Ambulatory Visit (INDEPENDENT_AMBULATORY_CARE_PROVIDER_SITE_OTHER): Payer: 59 | Admitting: Family Medicine

## 2015-11-20 VITALS — BP 100/70 | HR 72 | Temp 98.5°F | Resp 16 | Wt 189.0 lb

## 2015-11-20 DIAGNOSIS — M545 Low back pain, unspecified: Secondary | ICD-10-CM

## 2015-11-20 DIAGNOSIS — F410 Panic disorder [episodic paroxysmal anxiety] without agoraphobia: Secondary | ICD-10-CM | POA: Diagnosis not present

## 2015-11-20 DIAGNOSIS — G8929 Other chronic pain: Secondary | ICD-10-CM | POA: Diagnosis not present

## 2015-11-20 NOTE — Progress Notes (Signed)
Patient: David Reeves Male    DOB: 06-30-1970   44 y.o.   MRN: [id number] Visit Date: 11/20/2015  Today's Provider: Dortha Kern, PA   Chief Complaint  Patient presents with  . Medication Management   Subjective:    HPI  Patient wants to discuss medication and referral for back pain. Pain in the lumbar spine (upper and lower) that intensifies with bending over or lifting the left leg. Pain 7-8/10 that can be brought down to a 2-3/10 an hour after taking Norco7.5-325 mg TID, Xanaflex 4 mg TID and Celebrex 200 mg BID. Finds using a reverse gravity table occasionally some help and constantly using a heating pad, also helps. MRI lumbar spine from 10/31/2015 shows T12 compression fracture as well as small T12-L1 central disc herniation with mild canal stenosis. There is also a L4-5 central disc herniation with bilateral facet hypertrophy causing significant central canal and neural foraminal stenosis. Has had evaluation at [facility] neurosurgeon with final diagnosis of lumbar stenosis with neurogenic claudication. Recommended referral to pain management for possible implantable stimulator and possibly more physical therapy. Requesting referral to [facility] (Dr. Newell Coral) for a second opinion and establish care since it is closer to his home. Past Medical History:  Diagnosis Date  . Back pain 2014  . Bone spur   . Bulging disc 2014  . Degenerative disc disease   . Osteoarthritis   . Panic anxiety syndrome   . Taking multiple medications for chronic disease    Past Surgical History:  Procedure Laterality Date  . HERNIA REPAIR Left 2006,2014   [facility]  . TOE SURGERY Right 2007   Family History  Problem Relation Age of Onset  . Diabetes Mother   . Diabetes Maternal Uncle   . Diabetes Maternal Grandmother   . Heart disease Maternal Grandmother    Allergies  Allergen Reactions  . Amitriptyline Other (See Comments)    Urine retention  . Buspirone Other (See Comments)   Kidney pain  . Ciprofloxacin Other (See Comments)    hallucinations  . Doxepin Other (See Comments)    "Didn't feel right"  . Duloxetine Hcl Other (See Comments)    Sweating  . Escitalopram Other (See Comments)    Agitation  . Penicillins Other (See Comments)    Unknown -- childhood reaction  . Sertraline Other (See Comments)    Urine retention  . Trazodone Other (See Comments)    abd pain, urine retention   Current Meds  Medication Sig  . celecoxib (CELEBREX) 200 MG capsule Take 1 capsule by mouth  twice daily as needed  . clonazePAM (KLONOPIN) 1 MG tablet Take 1 tablet (1 mg total) by mouth every 8 (eight) hours.  Marland Kitchen HYDROcodone-acetaminophen (NORCO) 7.5-325 MG tablet Take 1 tablet by mouth 3 (three) times daily as needed.  Marland Kitchen tiZANidine (ZANAFLEX) 4 MG tablet Take 1 tablet by mouth 3  times daily    Review of Systems  Constitutional: Negative for appetite change, chills and fever.  Respiratory: Negative for chest tightness, shortness of breath and wheezing.   Cardiovascular: Negative for chest pain and palpitations.  Gastrointestinal: Negative for abdominal pain, nausea and vomiting.  Musculoskeletal: Positive for back pain.    Social History  Substance Use Topics  . Smoking status: Never Smoker  . Smokeless tobacco: Never Used  . Alcohol use No   Objective:   BP 100/70 (BP Location: Left Arm, Patient Position: Sitting, Cuff Size: Normal)   Pulse 72  Temp 98.5 F (36.9 C) (Oral)   Resp 16   Wt 189 lb (85.7 kg)   SpO2 97%   BMI 25.46 kg/m   Physical Exam  Constitutional: He is oriented to person, place, and time. He appears well-developed and well-nourished.  HENT:  Head: Normocephalic.  Eyes: Conjunctivae and EOM are normal.  Neck: Neck supple.  Cardiovascular: Normal rate and regular rhythm.   Pulmonary/Chest: Effort normal and breath sounds normal.  Abdominal: Bowel sounds are normal.  Musculoskeletal: He exhibits tenderness.  Lumbar pain and tenderness  with radiation into the right leg. No numbness. Increased pain with bending or twisting torso. Pain increases with attempt to test SLR's.  Lymphadenopathy:    He has no cervical adenopathy.  Neurological: He is alert and oriented to person, place, and time. He displays normal reflexes.  Psychiatric: Judgment normal. His mood appears anxious. He is agitated. Cognition and memory are normal. He expresses no suicidal plans and no homicidal plans.      Assessment & Plan:     1. Chronic low back pain MRI lumbar spine from 10/31/2015 shows T12 compression fracture as well as small T12-L1 central disc herniation with mild canal stenosis. There is also a L4-5 central disc herniation with bilateral facet hypertrophy causing significant central canal and neural foraminal stenosis. Continues to have pain in lower back frequently each day with radiation into the right lower leg. Norco 7.5-325 mg TID, Zanaflex 4 mg TID and Celebrex 200 mg BID helps control pain but seems to be worsening recently without injury. With above findings on MRI and at patient's request, will schedule referral to Dr. Newell Coral [facility]). Continue present medications for now.  - Ambulatory referral to Neurosurgery  2. Panic attack Continues to have episodes of anxiety/panic. Use of Clonazepam 1 mg TID helps with control. Has had intolerable side effects from Duloxetine, Amitriptyline, Doxepin, Escitalopram, Trazodone, Buspirone and Sertraline. Has psychiatry appointment/referral planned and working on paperwork packet for office visit planned 12-04-15.        Dortha Kern, PA  [facility]

## 2015-11-27 ENCOUNTER — Telehealth: Payer: Self-pay | Admitting: Family Medicine

## 2015-11-27 NOTE — Telephone Encounter (Signed)
Pt is requesting referral to see Dr [place]ud[place].

## 2015-11-30 ENCOUNTER — Ambulatory Visit: Payer: 59 | Admitting: Family Medicine

## 2015-12-05 ENCOUNTER — Other Ambulatory Visit: Payer: Self-pay

## 2015-12-05 DIAGNOSIS — F064 Anxiety disorder due to known physiological condition: Secondary | ICD-10-CM

## 2015-12-05 NOTE — Telephone Encounter (Signed)
Pharmacy requesting refill. Thanks!  

## 2015-12-06 ENCOUNTER — Other Ambulatory Visit: Payer: Self-pay | Admitting: Family Medicine

## 2015-12-06 DIAGNOSIS — F064 Anxiety disorder due to known physiological condition: Secondary | ICD-10-CM

## 2015-12-06 DIAGNOSIS — M545 Low back pain, unspecified: Secondary | ICD-10-CM

## 2015-12-06 DIAGNOSIS — G8929 Other chronic pain: Secondary | ICD-10-CM

## 2015-12-06 MED ORDER — HYDROCODONE-ACETAMINOPHEN 7.5-325 MG PO TABS: 1.0000 | ORAL_TABLET | Freq: Three times a day (TID) | ORAL | 0 refills | Status: DC | PRN

## 2015-12-06 MED ORDER — CLONAZEPAM 1 MG PO TABS: 1.0000 mg | ORAL_TABLET | Freq: Three times a day (TID) | ORAL | 0 refills | Status: DC

## 2015-12-06 NOTE — Telephone Encounter (Signed)
1 rf of each

## 2015-12-06 NOTE — Telephone Encounter (Signed)
Printed. Clonazepam called into the pharmacy.

## 2015-12-06 NOTE — Telephone Encounter (Signed)
Pt contacted office for refill request on the following medications: CB#[phone]/MW  clonazePAM (KLONOPIN) 1 MG tablet  HYDROcodone-acetaminophen (NORCO) 7.5-325 MG tablet  This is a patient of Maurine MinisterDennis

## 2015-12-06 NOTE — Telephone Encounter (Signed)
Please review-aa 

## 2015-12-26 ENCOUNTER — Other Ambulatory Visit: Payer: Self-pay | Admitting: Family Medicine

## 2015-12-26 ENCOUNTER — Telehealth: Payer: Self-pay | Admitting: Family Medicine

## 2015-12-26 DIAGNOSIS — G8929 Other chronic pain: Secondary | ICD-10-CM

## 2015-12-26 DIAGNOSIS — M545 Low back pain, unspecified: Secondary | ICD-10-CM

## 2015-12-26 MED ORDER — HYDROCODONE-ACETAMINOPHEN 7.5-325 MG PO TABS: 1.0000 | ORAL_TABLET | Freq: Three times a day (TID) | ORAL | 0 refills | Status: DC | PRN

## 2015-12-26 NOTE — Telephone Encounter (Signed)
Pt contacted office for refill request on the following medications: HYDROcodone-acetaminophen (NORCO) 7.5-325 MG tablet  Pt stated he will be out of the medication Thursday on  12/28/15 at 12 pm. Pt also, wanted to update Maurine MinisterDennis about his recent visit to ortho. Pt stated that his neurosurgeon stated his back was broken and needs to heal before they can do fusion surgery. Please advise. Thanks TNP

## 2015-12-26 NOTE — Progress Notes (Signed)
Prescription at front desk for pick up. Has follow up appointment scheduled for 01-23-16.

## 2016-01-01 ENCOUNTER — Other Ambulatory Visit: Payer: Self-pay | Admitting: Family Medicine

## 2016-01-01 DIAGNOSIS — F064 Anxiety disorder due to known physiological condition: Secondary | ICD-10-CM

## 2016-01-01 MED ORDER — CLONAZEPAM 1 MG PO TABS: 1.0000 mg | ORAL_TABLET | Freq: Three times a day (TID) | ORAL | 0 refills | Status: DC

## 2016-01-01 NOTE — Telephone Encounter (Signed)
Pt needs refill on his generic klonopin 1 mg  [facility] uses [facility]  ThanksTeri

## 2016-01-01 NOTE — Telephone Encounter (Signed)
Phone in refill to [facility] for Clonazepam 1 mg 1 TID prn #90 as ordered in chart.

## 2016-01-05 ENCOUNTER — Other Ambulatory Visit: Payer: Self-pay | Admitting: Neurosurgery

## 2016-01-05 DIAGNOSIS — S32010A Wedge compression fracture of first lumbar vertebra, initial encounter for closed fracture: Secondary | ICD-10-CM

## 2016-01-15 ENCOUNTER — Other Ambulatory Visit: Payer: Self-pay | Admitting: Family Medicine

## 2016-01-15 DIAGNOSIS — M545 Low back pain, unspecified: Secondary | ICD-10-CM

## 2016-01-15 DIAGNOSIS — G8929 Other chronic pain: Secondary | ICD-10-CM

## 2016-01-15 MED ORDER — HYDROCODONE-ACETAMINOPHEN 7.5-325 MG PO TABS: 1.0000 | ORAL_TABLET | Freq: Three times a day (TID) | ORAL | 0 refills | Status: DC | PRN

## 2016-01-15 NOTE — Progress Notes (Signed)
Patient advised.

## 2016-01-15 NOTE — Progress Notes (Signed)
Refilled prescription and remind patient to keep follow up appointment on 01-23-16

## 2016-01-15 NOTE — Telephone Encounter (Signed)
Pt contacted office for refill request on the following medications: HYDROcodone-acetaminophen (NORCO) 7.5-325 MG tablet Last Written: 12/26/15 Last OV: 11/20/15 Please advise. Thanks TNP

## 2016-01-18 ENCOUNTER — Ambulatory Visit
Admission: RE | Admit: 2016-01-18 | Discharge: 2016-01-18 | Disposition: A | Discharge status: Home / Self Care | Payer: 59 | Source: Ambulatory Visit | Attending: Neurosurgery | Admitting: Neurosurgery

## 2016-01-18 DIAGNOSIS — S32010A Wedge compression fracture of first lumbar vertebra, initial encounter for closed fracture: Secondary | ICD-10-CM

## 2016-01-18 DIAGNOSIS — M8580 Other specified disorders of bone density and structure, unspecified site: Secondary | ICD-10-CM | POA: Diagnosis not present

## 2016-01-18 DIAGNOSIS — Z8781 Personal history of (healed) traumatic fracture: Secondary | ICD-10-CM | POA: Insufficient documentation

## 2016-01-18 DIAGNOSIS — Z13820 Encounter for screening for osteoporosis: Secondary | ICD-10-CM | POA: Diagnosis present

## 2016-01-23 ENCOUNTER — Encounter: Payer: Self-pay | Admitting: Family Medicine

## 2016-01-23 ENCOUNTER — Ambulatory Visit (INDEPENDENT_AMBULATORY_CARE_PROVIDER_SITE_OTHER): Payer: 59 | Admitting: Family Medicine

## 2016-01-23 VITALS — BP 112/82 | HR 77 | Temp 97.8°F | Resp 16 | Wt 206.8 lb

## 2016-01-23 DIAGNOSIS — F064 Anxiety disorder due to known physiological condition: Secondary | ICD-10-CM

## 2016-01-23 DIAGNOSIS — S22080A Wedge compression fracture of T11-T12 vertebra, initial encounter for closed fracture: Secondary | ICD-10-CM | POA: Diagnosis not present

## 2016-01-23 DIAGNOSIS — M47817 Spondylosis without myelopathy or radiculopathy, lumbosacral region: Secondary | ICD-10-CM | POA: Diagnosis not present
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
## 2016-01-29 ENCOUNTER — Telehealth: Payer: Self-pay | Admitting: Family Medicine

## 2016-01-29 DIAGNOSIS — F064 Anxiety disorder due to known physiological condition: Secondary | ICD-10-CM

## 2016-01-29 NOTE — Telephone Encounter (Signed)
Pt needs refill coming up on the generic klonopin 1mg   [facility]  Pt's call back is [phone]  Thank sTeri

## 2016-01-29 NOTE — Telephone Encounter (Signed)
Phone in refill to [facility] when the refill request comes.

## 2016-01-31 MED ORDER — CLONAZEPAM 1 MG PO TABS: 1.0000 mg | ORAL_TABLET | Freq: Three times a day (TID) | ORAL | 0 refills | Status: DC

## 2016-01-31 NOTE — Telephone Encounter (Signed)
RX called in at [facility]  

## 2016-02-02 ENCOUNTER — Other Ambulatory Visit: Payer: Self-pay | Admitting: Family Medicine

## 2016-02-02 NOTE — Telephone Encounter (Signed)
Pt contacted office for refill request on the following medications:  HYDROcodone-acetaminophen (NORCO) 7.5-325 MG tablet.  CB#[phone]/MW °

## 2016-02-05 ENCOUNTER — Other Ambulatory Visit: Payer: Self-pay | Admitting: Family Medicine

## 2016-02-05 ENCOUNTER — Telehealth: Payer: Self-pay | Admitting: Family Medicine

## 2016-02-05 ENCOUNTER — Telehealth: Payer: Self-pay

## 2016-02-05 DIAGNOSIS — M545 Low back pain, unspecified: Secondary | ICD-10-CM

## 2016-02-05 DIAGNOSIS — M47817 Spondylosis without myelopathy or radiculopathy, lumbosacral region: Secondary | ICD-10-CM

## 2016-02-05 DIAGNOSIS — G8929 Other chronic pain: Secondary | ICD-10-CM

## 2016-02-05 DIAGNOSIS — S22080A Wedge compression fracture of T11-T12 vertebra, initial encounter for closed fracture: Secondary | ICD-10-CM

## 2016-02-05 MED ORDER — HYDROCODONE-ACETAMINOPHEN 7.5-325 MG PO TABS: 1.0000 | ORAL_TABLET | Freq: Three times a day (TID) | ORAL | 0 refills | Status: DC | PRN

## 2016-02-05 MED ORDER — TIZANIDINE HCL 4 MG PO TABS: 4.0000 mg | ORAL_TABLET | Freq: Three times a day (TID) | ORAL | 1 refills | Status: DC

## 2016-02-05 NOTE — Telephone Encounter (Signed)
Patient is requesting a 3 months of his Tizanidine instead of 2 months. Patient also states that [facility]

## 2016-02-05 NOTE — Telephone Encounter (Signed)
Contacted [facility] to change RX to a 90 day supply and they have already sent RX out to patient. Pharmacist put 90 day supply RX on file for patient.

## 2016-02-05 NOTE — Telephone Encounter (Signed)
Already sent in refill. Will need to call [facility] to see if we can make the change or wait until he uses this prescription up first.

## 2016-02-05 NOTE — Telephone Encounter (Signed)
FYI:: Patient wanted to let you know that he went to fill Hydrocodone RX and had to pay out of pocket. Patient reports this is the second time this has happen.  Patient states the pharmacist told him that insurance will only pay for 2 RX in a 60 day period. Patient is requesting that the next Hydrocodone refill be a 30 day supply instead of 20 day supply. That way he will only be getting 2 RX in a 60 day period and insurance will pay for it.

## 2016-02-05 NOTE — Telephone Encounter (Signed)
Pt contacted office for refill request on the following medications: tiZANidine (ZANAFLEX) 4 MG tablet [facility] I advised pt should still have another refill and pt stated that on his [facility] app it shows he would need to contact the office for refills on this medication. Last written: 11/06/15 for 180 tablets with 1 refill Last OV: 01/23/16 Please advise. Thanks TNP

## 2016-02-05 NOTE — Telephone Encounter (Signed)
Patient is requesting a 90 day supply #270 because it is the same price as 60 day supply.

## 2016-02-05 NOTE — Progress Notes (Signed)
Patient advised.

## 2016-02-05 NOTE — Telephone Encounter (Signed)
[facility]. This should last 4 months with the refill.

## 2016-02-05 NOTE — Telephone Encounter (Signed)
Acknowledged and will consider at next refill time.

## 2016-02-07 ENCOUNTER — Other Ambulatory Visit: Payer: Self-pay | Admitting: Family Medicine

## 2016-02-07 MED ORDER — TIZANIDINE HCL 4 MG PO TABS: 4.0000 mg | ORAL_TABLET | Freq: Three times a day (TID) | ORAL | 1 refills | Status: DC

## 2016-02-07 NOTE — Progress Notes (Signed)
Signed order to change to 90 day supply of Tizanidine for mail order pharmacy.

## 2016-02-08 ENCOUNTER — Telehealth: Payer: Self-pay | Admitting: Family Medicine

## 2016-02-08 NOTE — Telephone Encounter (Signed)
Pt called saying Dr. Jule SerNudleman ordered boone density test and the results came back

## 2016-02-08 NOTE — Telephone Encounter (Signed)
The results t score -1.5 and -1.7. So he was diagnosed osteo penia .  Spinal break is not healing, Dr. Is giving 2 more months to see how it is going and then possible surgery.  If you want to call patient back his number is [phone]  Thanks Barth Kirkseri

## 2016-02-22 ENCOUNTER — Other Ambulatory Visit: Payer: Self-pay | Admitting: Family Medicine

## 2016-02-22 DIAGNOSIS — G8929 Other chronic pain: Secondary | ICD-10-CM

## 2016-02-22 DIAGNOSIS — M545 Low back pain, unspecified: Secondary | ICD-10-CM

## 2016-02-22 MED ORDER — HYDROCODONE-ACETAMINOPHEN 7.5-325 MG PO TABS: 1.0000 | ORAL_TABLET | Freq: Three times a day (TID) | ORAL | 0 refills | Status: DC | PRN

## 2016-02-22 NOTE — Progress Notes (Signed)
Advise patient prescription has been changed to try to cover him for a whole month. Prescription printed for pick up at the front desk.

## 2016-02-22 NOTE — Telephone Encounter (Signed)
Patient advised RX is at front desk ready for pick up.

## 2016-02-22 NOTE — Telephone Encounter (Signed)
Pt contacted office for refill request on the following medications:  HYDROcodone-acetaminophen (NORCO) 7.5-325 MG tablet.  CB#[phone]/MW

## 2016-02-27 ENCOUNTER — Telehealth: Payer: Self-pay | Admitting: Family Medicine

## 2016-02-27 ENCOUNTER — Other Ambulatory Visit: Payer: Self-pay | Admitting: Family Medicine

## 2016-02-27 DIAGNOSIS — F064 Anxiety disorder due to known physiological condition: Secondary | ICD-10-CM

## 2016-02-27 MED ORDER — CLONAZEPAM 1 MG PO TABS: 1.0000 mg | ORAL_TABLET | Freq: Three times a day (TID) | ORAL | 0 refills | Status: DC

## 2016-02-27 NOTE — Telephone Encounter (Signed)
Pt contacted office for refill request on the following medications:  clonazePAM (KLONOPIN) 1 MG tablet.  [facility].  CB#[phone]/MW

## 2016-02-27 NOTE — Telephone Encounter (Signed)
Order placed in chart to phone in refill. Thanks!

## 2016-02-27 NOTE — Telephone Encounter (Signed)
Last filled 01/31/16.KW

## 2016-02-28 NOTE — Telephone Encounter (Signed)
Prescription has been phoned into pharmacy. KW 

## 2016-03-11 ENCOUNTER — Telehealth: Payer: Self-pay | Admitting: Family Medicine

## 2016-03-11 NOTE — Telephone Encounter (Signed)
Pt wanted to let David Reeves know that Saturday 03/09/16 his pinky finger on left hand went numb and is still numb. Pt stated that his ring finger on left hand has loss some feeling as well. Pt stated that he just wanted to let David Reeves know b/c he thinks this is due to something with his upper back. Pt stated that he contacted his neurosurgeon as well but he is out of the office this week. Thanks TNP

## 2016-03-25 ENCOUNTER — Other Ambulatory Visit: Payer: Self-pay | Admitting: Family Medicine

## 2016-03-25 DIAGNOSIS — M545 Low back pain, unspecified: Secondary | ICD-10-CM

## 2016-03-25 DIAGNOSIS — G8929 Other chronic pain: Secondary | ICD-10-CM

## 2016-03-25 MED ORDER — HYDROCODONE-ACETAMINOPHEN 7.5-325 MG PO TABS: 1.0000 | ORAL_TABLET | Freq: Three times a day (TID) | ORAL | 0 refills | Status: DC | PRN

## 2016-03-25 NOTE — Telephone Encounter (Signed)
Printed prescription for pick up and remind him he has a follow up appointment on 04-25-16.

## 2016-03-25 NOTE — Telephone Encounter (Signed)
Pt contacted office for refill request on the following medications: HYDROcodone-acetaminophen (NORCO) 7.5-325 MG tablet Last Rx: 02/22/16 Last OV: 01/23/16 Please advise. Thanks TNP

## 2016-03-25 NOTE — Telephone Encounter (Signed)
Patient advised.

## 2016-03-28 ENCOUNTER — Other Ambulatory Visit: Payer: Self-pay | Admitting: Family Medicine

## 2016-03-28 DIAGNOSIS — F064 Anxiety disorder due to known physiological condition: Secondary | ICD-10-CM

## 2016-03-28 NOTE — Telephone Encounter (Signed)
Pt needs refill on his   clonazePAM (KLONOPIN) 1 MG tablet  [facility]  Thanks Fortune Brandsteri

## 2016-03-29 MED ORDER — CLONAZEPAM 1 MG PO TABS: 1.0000 mg | ORAL_TABLET | Freq: Three times a day (TID) | ORAL | 1 refills | Status: DC

## 2016-03-29 NOTE — Telephone Encounter (Signed)
RX called in at [facility]  

## 2016-03-29 NOTE — Telephone Encounter (Signed)
Please phone in refill as authorized in chart. Thanks!

## 2016-04-08 ENCOUNTER — Telehealth: Payer: Self-pay | Admitting: Family Medicine

## 2016-04-08 ENCOUNTER — Other Ambulatory Visit: Payer: Self-pay | Admitting: Family Medicine

## 2016-04-08 DIAGNOSIS — M47817 Spondylosis without myelopathy or radiculopathy, lumbosacral region: Secondary | ICD-10-CM

## 2016-04-08 MED ORDER — CELECOXIB 200 MG PO CAPS: ORAL_CAPSULE | ORAL | 2 refills | Status: DC

## 2016-04-08 NOTE — Telephone Encounter (Signed)
Pt contacted office for refill request on the following medications: celecoxib (CELEBREX) 200 MG capsule Last Rx: 05/25/15 Pt is requesting a [facility]. Pt was advised that Maurine MinisterDennis is out of the office today and would like the request sent to another provider. Please advise. Thanks TNP

## 2016-04-25 ENCOUNTER — Ambulatory Visit (INDEPENDENT_AMBULATORY_CARE_PROVIDER_SITE_OTHER): Payer: 59 | Admitting: Family Medicine

## 2016-04-25 ENCOUNTER — Encounter: Payer: Self-pay | Admitting: Family Medicine

## 2016-04-25 VITALS — BP 122/84 | HR 74 | Temp 98.4°F | Resp 16 | Wt 217.2 lb

## 2016-04-25 DIAGNOSIS — E559 Vitamin D deficiency, unspecified: Secondary | ICD-10-CM

## 2016-04-25 DIAGNOSIS — G8929 Other chronic pain: Secondary | ICD-10-CM

## 2016-04-25 DIAGNOSIS — F410 Panic disorder [episodic paroxysmal anxiety] without agoraphobia: Secondary | ICD-10-CM | POA: Diagnosis not present

## 2016-04-25 DIAGNOSIS — M8588 Other specified disorders of bone density and structure, other site: Secondary | ICD-10-CM | POA: Diagnosis not present

## 2016-04-25 DIAGNOSIS — M5441 Lumbago with sciatica, right side: Secondary | ICD-10-CM | POA: Diagnosis not present

## 2016-04-25 DIAGNOSIS — S32010K Wedge compression fracture of first lumbar vertebra, subsequent encounter for fracture with nonunion: Secondary | ICD-10-CM

## 2016-04-25 MED ORDER — HYDROCODONE-ACETAMINOPHEN 7.5-325 MG PO TABS: 1.0000 | ORAL_TABLET | Freq: Three times a day (TID) | ORAL | 0 refills | Status: DC | PRN

## 2016-04-25 NOTE — Progress Notes (Signed)
Patient: David Reeves Male    DOB: March 27, 1971   46 y.o.   MRN: [id number] Visit Date: 04/25/2016  Today's Provider: Dortha Kern, PA   Chief Complaint  Patient presents with  . Back Pain  . Follow-up   Subjective:    HPI Patient is here for a 3 month follow up on chronic lower back pain. Patient's last OV 01/24/2016. Patient was advised to continue Hydrocodone. He reports good compliance with treatment plan. Patient is doing well with medication regimen. He reports increased upper back pain. Last OV with neurosurgeon was on 02/05/2016 and new x-rays were done at that time.   Past Medical History:  Diagnosis Date  . Back pain 2014  . Bone spur   . Bulging disc 2014  . Degenerative disc disease   . Osteoarthritis   . Panic anxiety syndrome   . Taking multiple medications for chronic disease    Patient Active Problem List   Diagnosis Date Noted  . Chronic low back pain 11/08/2014  . Hx of hemorrhoids 11/08/2014  . AAA (abdominal aortic aneurysm) (HCC) 09/22/2014  . Chronic pain associated with significant psychosocial dysfunction 09/22/2014  . Panic attack 09/22/2014  . AB (asthmatic bronchitis) 08/17/2014  . Anxiety 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 08/17/2014  . Backache 08/17/2014  . Lumbosacral spondylosis without myelopathy 08/17/2014  . 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 Brash 08/17/2014  . Low back pain 08/17/2014  . Tendon nodule 08/17/2014  . Episodic paroxysmal anxiety disorder 08/17/2014  . Hernia, inguinal, right 08/17/2014  . Fast heart beat 08/17/2014  . Illness 08/17/2014  . Inguinal hernia 10/14/2012   Past Surgical History:  Procedure Laterality Date  . HERNIA REPAIR Left 2006,2014   [facility]  . TOE SURGERY Right 2007   Family History  Problem Relation Age of Onset  . Diabetes Mother   . Diabetes Maternal Uncle   . Diabetes Maternal Grandmother   . Heart disease Maternal Grandmother    Allergies  Allergen Reactions  .  Amitriptyline Other (See Comments)    Urine retention  . Buspirone Other (See Comments)    Kidney pain  . Ciprofloxacin Other (See Comments)    hallucinations  . Doxepin Other (See Comments)    "Didn't feel right"  . Duloxetine Hcl Other (See Comments)    Sweating  . Escitalopram Other (See Comments)    Agitation  . Penicillins Other (See Comments)    Unknown -- childhood reaction  . Sertraline Other (See Comments)    Urine retention  . Trazodone Other (See Comments)    abd pain, urine retention    Previous Medications   CALCIUM CARB-CHOLECALCIFEROL (CALCIUM+D3) 600-800 MG-UNIT TABS    Take by mouth.   CELECOXIB (CELEBREX) 200 MG CAPSULE    Take 1 capsule by mouth  twice daily as needed   CHARCOAL ACTIVATED (ACTIVATED CHARCOAL PO)    Take by mouth.   CHOLECALCIFEROL (VITAMIN D3) 2000 UNITS TABS    Take by mouth.   CLONAZEPAM (KLONOPIN) 1 MG TABLET    Take 1 tablet (1 mg total) by mouth every 8 (eight) hours.   HYDROCODONE-ACETAMINOPHEN (NORCO) 7.5-325 MG TABLET    Take 1 tablet by mouth 3 (three) times daily as needed.   MULTIPLE VITAMIN (MULTIVITAMIN) TABLET    Take 1 tablet by mouth daily.   PROBIOTIC PRODUCT (PROBIOTIC COLON SUPPORT PO)    Take by mouth.   TIZANIDINE (ZANAFLEX) 4 MG TABLET    Take 1 tablet (  4 mg total) by mouth 3 (three) times daily.    Review of Systems  Constitutional: Negative.   Respiratory: Negative.   Cardiovascular: Negative.   Musculoskeletal: Positive for back pain.    Social History  Substance Use Topics  . Smoking status: Never Smoker  . Smokeless tobacco: Never Used  . Alcohol use No   Objective:   BP 122/84 (BP Location: Right Arm, Patient Position: Sitting, Cuff Size: Normal)   Pulse 74   Temp 98.4 F (36.9 C) (Oral)   Resp 16   Wt 217 lb 3.2 oz (98.5 kg)   SpO2 96%   BMI 29.25 kg/m   Physical Exam  Constitutional: He is oriented to person, place, and time. He appears well-developed and well-nourished.  Neck: Neck supple.    Cardiovascular: Normal rate and regular rhythm.   Pulmonary/Chest: Effort normal and breath sounds normal.  Abdominal: Soft. Bowel sounds are normal.  Musculoskeletal: He exhibits tenderness.  Tender lower lumbar and upper lumbar vertebrae to palpation. Soreness in thoracic musculature. DTR's 2+ and symmetric both knees. No muscle weakness. Constant pain 3-4/10 with spikes to 8-9/10 with any attempt at flexion or extension.  Neurological: He is alert and oriented to person, place, and time. He has normal reflexes.      Assessment & Plan:     1. Chronic 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 Continues to have chronic low back pain with sciatic into right posterior leg with bending, stretching, washing dishes, sitting or standing for very long. The Norco and Celebrex 200 mg once or twice a day with use of heating pads and back brace, helps to control pain to a 2-3/10 level. Follow up evaluation by Dr. Newell CoralNudelman (neurosurgeon) in Oct. 2017 showed the L1 fracture need further healing time. Dr. Newell CoralNudelman feels he is going to need an L5-S1 decompression including laminectomy, fasciectomy, foraminotomy, a bilateral L5-S1 posterior lumbar interbody arthrodesis with implants and bone grafting when this fracture stabilizes. Will refill Norco and follow up with Dr. Newell CoralNudelman in 2 monhts and recheck here in 3 months. - HYDROcodone-acetaminophen (NORCO) 7.5-325 MG tablet; Take 1 tablet by mouth 3 (three) times daily as needed.  Dispense: 90 tablet; Refill: 0 - CBC with Differential/Platelet  2. Panic anxiety syndrome Stable with intermittent flares controlled by using Clonazepam 1 mg TID. This also seems to help with back spasms not completely controlled by Tizanidine 4 mg TID. Continue present dosage and recheck routine labs. - CBC with Differential/Platelet - Comprehensive metabolic panel  3. Osteopenia of spine BMD per Dr. Newell CoralNudelman on 01-18-16 showed osteopenia (right femur -1.7 and spine -1.5).  Has been taking calcium 600 mg and vitamin-D 800 IU supplements. Will recheck blood levels. - Comprehensive metabolic panel - Vitamin D (25 hydroxy)  4. Vitamin D deficiency States Dr. Newell CoralNudelman advised him to get Vitamin D 5000 IU qd since his vitamin D level was in the 20's 3 months ago, to help with bone healing. Will recheck labs. - CBC with Differential/Platelet - Vitamin D (25 hydroxy)  5. 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 Newell CoralNudelman (neurosurgeon) showed non-healing L1 wedge compression fracture with 60-70% loss of height anteriorly and 10-20% loss of height posteriorly. Norco and Celebrex with Zanaflex for spasm helps to control pain. Continues to use thoracolumbar brace - HYDROcodone-acetaminophen (NORCO) 7.5-325 MG tablet; Take 1 tablet by mouth 3 (three) times daily as needed.  Dispense: 90 tablet; Refill: 0

## 2016-04-26 ENCOUNTER — Telehealth: Payer: Self-pay | Admitting: Family Medicine

## 2016-04-26 ENCOUNTER — Telehealth: Payer: Self-pay

## 2016-04-26 LAB — CBC WITH DIFFERENTIAL/PLATELET
Basophils Absolute: 0 10*3/uL (ref 0.0–0.2)
Basos: 0 %
EOS (ABSOLUTE): 0.1 10*3/uL (ref 0.0–0.4)
Eos: 1 %
Hematocrit: 44.3 % (ref 37.5–51.0)
Hemoglobin: 15 g/dL (ref 13.0–17.7)
Immature Grans (Abs): 0 10*3/uL (ref 0.0–0.1)
Immature Granulocytes: 0 %
Lymphocytes Absolute: 2 10*3/uL (ref 0.7–3.1)
Lymphs: 29 %
MCH: 32.8 pg (ref 26.6–33.0)
MCHC: 33.9 g/dL (ref 31.5–35.7)
MCV: 97 fL (ref 79–97)
Monocytes Absolute: 0.6 10*3/uL (ref 0.1–0.9)
Monocytes: 8 %
Neutrophils Absolute: 4.4 10*3/uL (ref 1.4–7.0)
Neutrophils: 62 %
Platelets: 263 10*3/uL (ref 150–379)
RBC: 4.58 x10E6/uL (ref 4.14–5.80)
RDW: 13.1 % (ref 12.3–15.4)
WBC: 7 10*3/uL (ref 3.4–10.8)

## 2016-04-26 LAB — COMPREHENSIVE METABOLIC PANEL
ALT: 16 IU/L (ref 0–44)
AST: 15 IU/L (ref 0–40)
Albumin/Globulin Ratio: 1.9 (ref 1.2–2.2)
Albumin: 4.8 g/dL (ref 3.5–5.5)
Alkaline Phosphatase: 57 IU/L (ref 39–117)
BUN/Creatinine Ratio: 13 (ref 9–20)
BUN: 11 mg/dL (ref 6–24)
Bilirubin Total: 0.7 mg/dL (ref 0.0–1.2)
CO2: 24 mmol/L (ref 18–29)
Calcium: 9.6 mg/dL (ref 8.7–10.2)
Chloride: 96 mmol/L (ref 96–106)
Creatinine, Ser: 0.85 mg/dL (ref 0.76–1.27)
GFR calc Af Amer: 121 mL/min/{1.73_m2} (ref >59)
GFR calc non Af Amer: 104 mL/min/{1.73_m2} (ref >59)
Globulin, Total: 2.5 g/dL (ref 1.5–4.5)
Glucose: 95 mg/dL (ref 65–99)
Potassium: 4 mmol/L (ref 3.5–5.2)
Sodium: 139 mmol/L (ref 134–144)
Total Protein: 7.3 g/dL (ref 6.0–8.5)

## 2016-04-26 LAB — VITAMIN D 25 HYDROXY (VIT D DEFICIENCY, FRACTURES): Vit D, 25-Hydroxy: 53.1 ng/mL (ref 30.0–100.0)

## 2016-04-26 NOTE — Telephone Encounter (Signed)
Pt stated he was advised that Maurine Ministerennis could call [facility] for an override so pt can get his  clonazePAM (KLONOPIN) 1 MG tablet Pt is going to contact [facility] to request they send us a PA for the HYDROcodone-acetaminophen (NORCO) 7.5-325 MG tablet. Please advise. Thanks TNP

## 2016-04-26 NOTE — Telephone Encounter (Signed)
Contacted [facility] and they state the refill is to early so insurance will not pay until 04/29/2016. They will process RX today and mail out to patient on Monday. Patient picked up [facility] from [facility] yesterday.

## 2016-04-26 NOTE — Telephone Encounter (Signed)
-----   Message from Tamsen Roersennis E Chrismon, [place] sent at 04/26/2016 10:53 AM EST ----- All blood tests in very good shape. Vitamin D back in the middle of the normal range. Don't need any extra Vitamin D and calcium in upper limits of normal. Continue present supplement regimen to help fractures heal. Normal kidney and liver function tests. No sign of anemia or infection. Recheck appointment as planned in 3 months.

## 2016-04-26 NOTE — Telephone Encounter (Signed)
Advised pt of lab results. Pt verbally acknowledges understanding. Emmy Keng Drozdowski, CMA   

## 2016-05-13 ENCOUNTER — Telehealth: Payer: Self-pay | Admitting: Family Medicine

## 2016-05-13 NOTE — Telephone Encounter (Signed)
Kenneth with [facility] called regarding the PA  For the geIantha Fallenneric Norco.  He has some questions regarding the PA.  He needs a nurse to call him back.  His call back is [phone]  [facility] Surgery CenterhanksTeri

## 2016-05-13 NOTE — Telephone Encounter (Signed)
It should be in the stack to be faxed. Finished it last week. Snow days may have delayed faxing.

## 2016-05-14 NOTE — Telephone Encounter (Signed)
Contacted [facility] the representative states PA for Hydrocodone has been denied. She will fax denial to us.

## 2016-05-16 ENCOUNTER — Telehealth: Payer: Self-pay | Admitting: Family Medicine

## 2016-05-16 NOTE — Telephone Encounter (Signed)
Pt stated he spoke with his insurance this morning and was advised to request that we resubmit the PA with more details as to why he needs the medication. Please advise. Thanks TNP

## 2016-05-16 NOTE — Telephone Encounter (Signed)
Patient advised. He will contact [facility] to refax a PA form for Hydrocodone.

## 2016-05-16 NOTE — Telephone Encounter (Signed)
LMTCB

## 2016-05-16 NOTE — Telephone Encounter (Signed)
Can try again. Need PA form.

## 2016-05-22 ENCOUNTER — Telehealth: Payer: Self-pay | Admitting: Family Medicine

## 2016-05-22 DIAGNOSIS — M5441 Lumbago with sciatica, right side: Secondary | ICD-10-CM

## 2016-05-22 DIAGNOSIS — S32010K Wedge compression fracture of first lumbar vertebra, subsequent encounter for fracture with nonunion: Secondary | ICD-10-CM

## 2016-05-22 DIAGNOSIS — G8929 Other chronic pain: Secondary | ICD-10-CM

## 2016-05-22 NOTE — Telephone Encounter (Signed)
Pt contacted office for refill request on the following medications: HYDROcodone-acetaminophen (NORCO) 7.5-325 MG tablet  Last Rx: 04/25/16 Please advise. Thanks TNP

## 2016-05-23 MED ORDER — HYDROCODONE-ACETAMINOPHEN 7.5-325 MG PO TABS: 1.0000 | ORAL_TABLET | Freq: Three times a day (TID) | ORAL | 0 refills | Status: DC | PRN

## 2016-05-24 ENCOUNTER — Encounter: Payer: Self-pay | Admitting: Family Medicine

## 2016-05-24 ENCOUNTER — Other Ambulatory Visit: Payer: Self-pay | Admitting: Family Medicine

## 2016-05-24 ENCOUNTER — Telehealth: Payer: Self-pay | Admitting: Family Medicine

## 2016-05-24 DIAGNOSIS — S32010A Wedge compression fracture of first lumbar vertebra, initial encounter for closed fracture: Secondary | ICD-10-CM | POA: Insufficient documentation

## 2016-05-24 DIAGNOSIS — F064 Anxiety disorder due to known physiological condition: Secondary | ICD-10-CM

## 2016-05-24 DIAGNOSIS — R52 Pain, unspecified: Secondary | ICD-10-CM

## 2016-05-24 MED ORDER — CLONAZEPAM 1 MG PO TABS: 1.0000 mg | ORAL_TABLET | Freq: Three times a day (TID) | ORAL | 1 refills | Status: DC

## 2016-05-24 NOTE — Telephone Encounter (Signed)
RX called in at [facility]  

## 2016-05-24 NOTE — Telephone Encounter (Signed)
Pt contacted office for refill request on the following medications:  clonazePAM (KLONOPIN) 1 MG tablet.  [facility].  CB#[phone]/MW °

## 2016-05-24 NOTE — Telephone Encounter (Signed)
Phone in refill as order in charts medication list. Thanks!

## 2016-05-25 ENCOUNTER — Telehealth: Payer: Self-pay | Admitting: Family Medicine

## 2016-05-25 NOTE — Telephone Encounter (Signed)
[facility] [place] because pt brought in prescription for hydrocodone.She wanted to make sure you knew pt is also taking clonazepam.Needs verbal approval before filling

## 2016-05-25 NOTE — Telephone Encounter (Signed)
Marchelle Folksmanda advised per Maurine Ministerennis Chrismon this medication is ok to be filled. Marchelle Folksmanda will let patient know-aa

## 2016-05-29 ENCOUNTER — Telehealth: Payer: Self-pay | Admitting: Family Medicine

## 2016-05-29 LAB — DRUG SCREEN, URINE
Amphetamines, Urine: NEGATIVE ng/mL
Barbiturate screen, urine: NEGATIVE ng/mL
Benzodiazepine Quant, Ur: NEGATIVE ng/mL
Cannabinoid Quant, Ur: POSITIVE ng/mL
Cocaine (Metab.): NEGATIVE ng/mL
Opiate Quant, Ur: POSITIVE ng/mL
PCP Quant, Ur: NEGATIVE ng/mL

## 2016-05-29 NOTE — Telephone Encounter (Signed)
Pt is requesting you to contact [facility] to release his medication.  Please contact the patient and let him know once this is taken care of.

## 2016-05-30 ENCOUNTER — Telehealth: Payer: Self-pay

## 2016-05-30 NOTE — Telephone Encounter (Signed)
Patient advised. Patient states he has been taking Clonazepam on a daily basis.

## 2016-05-30 NOTE — Telephone Encounter (Signed)
-----   Message from Tamsen Roersennis E Chrismon, [place] sent at 05/30/2016 12:26 PM EST ----- Drug screen positive for Cannabinoid (Marijuana) and Opiate (Hydrocodone). No evidence of Benzodiazepine (Clonazepam). Is he only using it prn? If not using it, don't need to refill. If not using it often enough to have it show up in test, can cut down of refills and save the money.

## 2016-05-30 NOTE — Telephone Encounter (Signed)
Contacted [facility] Clonazepam was mailed out to patient today. Patient advised.

## 2016-05-30 NOTE — Telephone Encounter (Signed)
Pt called and request for Maurine MinisterDennis to return his call. Pt was advised that Maurine MinisterDennis is out of the office this afternoon. Thanks TNP

## 2016-05-31 NOTE — Telephone Encounter (Signed)
No answer at his number. Left message to return the call as needed.

## 2016-06-10 ENCOUNTER — Telehealth: Payer: Self-pay | Admitting: Family Medicine

## 2016-06-10 NOTE — Telephone Encounter (Signed)
Spoke with patient about neurosurgeon suggesting back injections. Patient has tried to contact Dr. Lovell SheehanJenkins or Dr. Newell CoralNudelman (neurosurgeon) but has not had a return call yet. States he is concerned about osteoporosis from steroid use. Will let us know if the neurosurgeon has another option or who to refer to for local injections.

## 2016-06-10 NOTE — Telephone Encounter (Signed)
Pt would like David Reeves to return his call to discuss [place] of him having to drive [place] the [place]. Please advise. Thanks TNP

## 2016-06-12 ENCOUNTER — Telehealth: Payer: Self-pay

## 2016-06-12 NOTE — Telephone Encounter (Signed)
Patient wanted to let you know he has a follow up appointment scheduled with neuro on 3/28.

## 2016-06-13 NOTE — Telephone Encounter (Signed)
Acknowledged.

## 2016-06-14 ENCOUNTER — Telehealth: Payer: Self-pay

## 2016-06-14 NOTE — Telephone Encounter (Signed)
Patient called and states he would like to speak to you personally in regards to the worsening anxiety, insomnia and panic attacks he is getting in his sleep and talk about getting a psychiatrist referral but before doing that wanted to speak to you. If able to please call him sometime today. Please review-aa

## 2016-06-14 NOTE — Telephone Encounter (Signed)
Having "panic nightmares" two nights and had to take an extra Clonazepam those nights. Wants to check into his insurance about a psychiatrist in the network with them before getting a referral. Finds the panic/anxiety is better controlled with the Clonazepam but it wears off in 4-6 hours. Also feels the Norco lasts 4 hours but is still keeping the dosing at 8 hour intervals. Pain is brought from 8/10 to a 4/10 by this narcotic with Zanaflex 4 mg TID and Celebrex 200 mg BID. He will call us back in a couple days if he needs us to authorize a referral.

## 2016-06-17 ENCOUNTER — Telehealth: Payer: Self-pay

## 2016-06-17 DIAGNOSIS — F410 Panic disorder [episodic paroxysmal anxiety] without agoraphobia: Secondary | ICD-10-CM

## 2016-06-17 NOTE — Telephone Encounter (Signed)
Patient called and states he has tried to call several different offices for psychiatrist. He would like to speak to you about this again. He has not found any good reviews for any of the offices he googled.-aa

## 2016-06-18 NOTE — Telephone Encounter (Signed)
Has been trying to locate a psychiatrist without satisfactory success ("poor internet ratings for those that accept my insurance"). Will try to locate one in [place] or [place]. Referral request placed in orders.

## 2016-06-19 ENCOUNTER — Telehealth: Payer: Self-pay | Admitting: Family Medicine

## 2016-06-19 NOTE — Telephone Encounter (Signed)
Pt needs refill on his pain meds  HYDROcodone-acetaminophen (NORCO) 7.5-325 MG tablet  He also needs labs for klonipin.  Thank sTeri

## 2016-06-19 NOTE — Telephone Encounter (Signed)
Please review. David Reeves said that patient was asking to get lab test to show that he is using Klonopin since [facility] did not show this when drug test was done-aa

## 2016-06-21 ENCOUNTER — Other Ambulatory Visit: Payer: Self-pay

## 2016-06-21 ENCOUNTER — Other Ambulatory Visit: Payer: Self-pay | Admitting: Family Medicine

## 2016-06-21 DIAGNOSIS — M5441 Lumbago with sciatica, right side: Secondary | ICD-10-CM

## 2016-06-21 DIAGNOSIS — F064 Anxiety disorder due to known physiological condition: Secondary | ICD-10-CM

## 2016-06-21 DIAGNOSIS — G8929 Other chronic pain: Secondary | ICD-10-CM

## 2016-06-21 DIAGNOSIS — F410 Panic disorder [episodic paroxysmal anxiety] without agoraphobia: Principal | ICD-10-CM

## 2016-06-21 DIAGNOSIS — S32010K Wedge compression fracture of first lumbar vertebra, subsequent encounter for fracture with nonunion: Secondary | ICD-10-CM

## 2016-06-21 MED ORDER — HYDROCODONE-ACETAMINOPHEN 7.5-325 MG PO TABS: 1.0000 | ORAL_TABLET | Freq: Three times a day (TID) | ORAL | 0 refills | Status: DC | PRN

## 2016-06-21 NOTE — Telephone Encounter (Signed)
Will print prescription for pick up.

## 2016-06-21 NOTE — Telephone Encounter (Signed)
Will print prescription for pick up and try to get clonazepam blood level test in chart as a future order. May print off when patient ready to go to the lab.

## 2016-06-21 NOTE — Telephone Encounter (Signed)
Pt would like to pick this up today, he will be out Sunday.   Thanks,   -Vernona RiegerLaura

## 2016-06-25 LAB — CLONAZEPAM LEVEL: Clonazepam Lvl: 21 ng/mL (ref 20–70)

## 2016-06-25 NOTE — Telephone Encounter (Signed)
-----   Message from Tamsen Roersennis E Chrismon, [place] sent at 06/25/2016  8:14 AM EST ----- Blood level of clonazepam in the normal range. Your metabolism probably is not the best for checking this in a urine specimen. Will do a random check later in the year.

## 2016-06-25 NOTE — Telephone Encounter (Signed)
Patient advised of lab results

## 2016-06-28 ENCOUNTER — Telehealth: Payer: Self-pay

## 2016-06-28 NOTE — Telephone Encounter (Signed)
Has had difficulty trying to find a psychiatrist that is a "D.O." instead of an "M.D." Wants to focus on alternative treatments for anxiety/panic disorder and chronic back pain without additional medications. Recommend he consider seeking out a physiatrist. States he will investigate his avenue and try to see if any are available in his insurance network. Has an appointment to follow up with his neurosurgeon on 07-26-16 and follow up here on 07-22-16.

## 2016-06-28 NOTE — Telephone Encounter (Signed)
Patient would like to have you call him back at some point in regards to psychiatric referral.-aa

## 2016-07-03 ENCOUNTER — Telehealth: Payer: Self-pay | Admitting: Family Medicine

## 2016-07-03 NOTE — Telephone Encounter (Signed)
Pt [facility].  Wnating to know the status and to give you some info he found out from his ins.  Please call pat at [phone]  Thanks teri

## 2016-07-04 NOTE — Telephone Encounter (Signed)
Patient has researched spinal specialists and has sent an e-mail to Thurnell Garbeatherine Duncan, DO (physiatrist) in [place], [place]. Hoping she will accept him into her practice. He will let us know if referral and records needed.

## 2016-07-22 ENCOUNTER — Encounter: Payer: Self-pay | Admitting: Family Medicine

## 2016-07-22 ENCOUNTER — Ambulatory Visit (INDEPENDENT_AMBULATORY_CARE_PROVIDER_SITE_OTHER): Payer: 59 | Admitting: Family Medicine

## 2016-07-22 VITALS — BP 122/88 | HR 96 | Temp 98.9°F | Resp 16 | Wt 205.0 lb

## 2016-07-22 DIAGNOSIS — F410 Panic disorder [episodic paroxysmal anxiety] without agoraphobia: Secondary | ICD-10-CM

## 2016-07-22 DIAGNOSIS — M48062 Spinal stenosis, lumbar region with neurogenic claudication: Secondary | ICD-10-CM | POA: Diagnosis not present

## 2016-07-22 NOTE — Progress Notes (Signed)
Patient: David Reeves Male    DOB: Sep 22, 1970   46 y.o.   MRN: [id number] Visit Date: 07/22/2016  Today's Provider: Dortha Kern, PA   Chief Complaint  Patient presents with  . Anxiety  . Back Pain   Subjective:    Anxiety  Presents for follow-up visit. Symptoms include decreased concentration, depressed mood, excessive worry, hyperventilation, insomnia, irritability, malaise, muscle tension, nervous/anxious behavior (about spine/back pain), obsessions, panic and restlessness. Patient reports no chest pain, compulsions, confusion, dizziness, dry mouth, feeling of choking, nausea, palpitations, shortness of breath or suicidal ideas. Symptoms occur constantly. The severity of symptoms is moderate. The quality of sleep is poor.        Follow up for Chronic Back Pain  The patient was last seen for this 3 months ago. Changes made at last visit include continuing hydrocodone and Celebrex, along with heating pad and back brace.  He reports excellent compliance with treatment. He feels that condition is Unchanged. He is not having side effects.   ------------------------------------------------------------------------------------ Past Medical History:  Diagnosis Date  . Back pain 2014  . Bone spur   . Bulging disc 2014  . Degenerative disc disease   . Osteoarthritis   . Panic anxiety syndrome   . Taking multiple medications for chronic disease    Past Surgical History:  Procedure Laterality Date  . HERNIA REPAIR Left 2006,2014   [facility]  . TOE SURGERY Right 2007   Family History  Problem Relation Age of Onset  . Diabetes Mother   . Diabetes Maternal Uncle   . Diabetes Maternal Grandmother   . Heart disease Maternal Grandmother     Allergies  Allergen Reactions  . Amitriptyline Other (See Comments)    Urine retention  . Buspirone Other (See Comments)    Kidney pain  . Ciprofloxacin Other (See Comments)    hallucinations  . Doxepin Other (See Comments)      "Didn't feel right"  . Duloxetine Hcl Other (See Comments)    Sweating  . Escitalopram Other (See Comments)    Agitation  . Penicillins Other (See Comments)    Unknown -- childhood reaction  . Sertraline Other (See Comments)    Urine retention  . Trazodone Other (See Comments)    abd pain, urine retention     Current Outpatient Prescriptions:  .  Calcium Carb-Cholecalciferol (CALCIUM+D3) 600-800 MG-UNIT TABS, Take by mouth., Disp: , Rfl:  .  celecoxib (CELEBREX) 200 MG capsule, Take 1 capsule by mouth  twice daily as needed, Disp: 180 capsule, Rfl: 2 .  Cholecalciferol (VITAMIN D3) 2000 units TABS, Take by mouth., Disp: , Rfl:  .  clonazePAM (KLONOPIN) 1 MG tablet, Take 1 tablet (1 mg total) by mouth every 8 (eight) hours., Disp: 90 tablet, Rfl: 1 .  Glucosamine-Chondroit-Vit C-Mn (GLUCOSAMINE CHONDR 1500 COMPLX PO), Take by mouth., Disp: , Rfl:  .  HYDROcodone-acetaminophen (NORCO) 7.5-325 MG tablet, Take 1 tablet by mouth 3 (three) times daily as needed., Disp: 90 tablet, Rfl: 0 .  Multiple Vitamin (MULTIVITAMIN) tablet, Take 1 tablet by mouth daily., Disp: , Rfl:  .  Probiotic Product (PROBIOTIC COLON SUPPORT PO), Take by mouth., Disp: , Rfl:  .  tiZANidine (ZANAFLEX) 4 MG tablet, Take 1 tablet (4 mg total) by mouth 3 (three) times daily., Disp: 270 tablet, Rfl: 1  Review of Systems  Constitutional: Positive for irritability.  Respiratory: Negative for shortness of breath.   Cardiovascular: Negative for chest pain and palpitations.  Gastrointestinal: Negative for nausea.  Genitourinary: Positive for difficulty urinating (hesitancy).  Musculoskeletal: Positive for back pain.  Neurological: Negative for dizziness.  Psychiatric/Behavioral: Positive for decreased concentration. Negative for confusion and suicidal ideas. The patient is nervous/anxious (about spine/back pain) and has insomnia.     Social History  Substance Use Topics  . Smoking status: Never Smoker  .  Smokeless tobacco: Never Used  . Alcohol use No   Objective:   BP 122/88 (BP Location: Right Arm, Patient Position: Sitting, Cuff Size: Large)   Pulse 96   Temp 98.9 F (37.2 C) (Oral)   Resp 16   Wt 205 lb (93 kg)   BMI 27.61 kg/m  Vitals:   07/22/16 1614  BP: 122/88  Pulse: 96  Resp: 16  Temp: 98.9 F (37.2 C)  TempSrc: Oral  Weight: 205 lb (93 kg)     Physical Exam  Constitutional: He is oriented to person, place, and time. He appears well-developed and well-nourished. No distress.  HENT:  Head: Normocephalic and atraumatic.  Right Ear: Hearing normal.  Left Ear: Hearing normal.  Nose: Nose normal.  Eyes: Conjunctivae and lids are normal. Right eye exhibits no discharge. Left eye exhibits no discharge. No scleral icterus.  Neck: Neck supple.  Cardiovascular: Normal rate and regular rhythm.   Pulmonary/Chest: Effort normal and breath sounds normal. No respiratory distress.  Abdominal: Soft. Bowel sounds are normal.  Musculoskeletal: He exhibits tenderness.  Lower back pressure pain with tenderness. Increased pain with transition from sitting to standing with stiffness. Using back brace 4 hours a day. Pain usually 3/10 with medication. Radiation of pain back of both legs to ankles. Feet get numb to sit on a toilet. SLR's causes pain to 80 degrees bilaterally. Unable to elicit DTR's. Good pulses bilaterally in feet.  Neurological: He is alert and oriented to person, place, and time.  Skin: Skin is intact. No lesion and no rash noted.  Psychiatric: He has a normal mood and affect. His speech is normal and behavior is normal. Thought content normal.      Assessment & Plan:     1. Lumbar stenosis with neurogenic claudication Stable control of chronic back pain with use of Celebrex 200 mg BID, Tizanidine 4 mg TID prn spasms and Norco 7.5-325 mg one TID prn pain. Pain level maintained at 3/10 with back brace, frequent heating pad application and above medications. Pain spikes  to 7-8/10 when medication wears off or he turns/moves the wrong way. Has a follow up appointment with Dr. Newell Coral (neurosurgeon) on 07-26-16 to follow up vertebral fracture with osteopenia. Refill written for the Norco for one month supply. Will recheck in 3 months.  2. Panic attack Still having anxiety and panic episodes that disrupts sleep. Clonazepam 1 mg TID helps to reduce anxiety and control extreme panic. Refills for Clonazepam written for one month supply. Will recheck in 3 months.     Patient seen and examined by Dortha Kern, PA, and note scribed by Allene Dillon, CMA.  Dortha Kern, PA  [facility]

## 2016-07-26 ENCOUNTER — Ambulatory Visit: Payer: 59 | Admitting: Family Medicine

## 2016-07-31 ENCOUNTER — Telehealth: Payer: Self-pay | Admitting: Family Medicine

## 2016-07-31 NOTE — Telephone Encounter (Signed)
Pt called back saying he went to see his surgeon and he told him that he did not recommend surgery.  He is afraid it will  Make it worse.  Thanks Barth Kirks

## 2016-08-19 ENCOUNTER — Other Ambulatory Visit: Payer: Self-pay | Admitting: Family Medicine

## 2016-08-19 DIAGNOSIS — M5441 Lumbago with sciatica, right side: Secondary | ICD-10-CM

## 2016-08-19 DIAGNOSIS — S32010K Wedge compression fracture of first lumbar vertebra, subsequent encounter for fracture with nonunion: Secondary | ICD-10-CM

## 2016-08-19 DIAGNOSIS — G8929 Other chronic pain: Secondary | ICD-10-CM

## 2016-08-19 MED ORDER — HYDROCODONE-ACETAMINOPHEN 7.5-325 MG PO TABS: 1.0000 | ORAL_TABLET | Freq: Three times a day (TID) | ORAL | 0 refills | Status: DC | PRN

## 2016-08-19 NOTE — Telephone Encounter (Signed)
Printed prescription for pick up at the front desk. Keep appointment in June as scheduled.

## 2016-08-19 NOTE — Telephone Encounter (Signed)
Patient advised RX is at the front desk for pickup.  

## 2016-08-19 NOTE — Telephone Encounter (Signed)
Pt contacted office for refill request on the following medications:  HYDROcodone-acetaminophen (NORCO) 7.5-325 MG tablet.  CB#[phone]/MW °

## 2016-08-20 ENCOUNTER — Telehealth: Payer: Self-pay | Admitting: Family Medicine

## 2016-08-20 ENCOUNTER — Other Ambulatory Visit: Payer: Self-pay | Admitting: Family Medicine

## 2016-08-20 DIAGNOSIS — F064 Anxiety disorder due to known physiological condition: Secondary | ICD-10-CM

## 2016-08-20 MED ORDER — CLONAZEPAM 1 MG PO TABS: 1.0000 mg | ORAL_TABLET | Freq: Three times a day (TID) | ORAL | 1 refills | Status: DC

## 2016-08-20 NOTE — Telephone Encounter (Signed)
[facility] faxed a request for the following medication. Thanks CC  clonazePAM (KLONOPIN) 1 MG tablet

## 2016-08-20 NOTE — Telephone Encounter (Signed)
RX called in at [facility]  

## 2016-08-20 NOTE — Telephone Encounter (Signed)
Phone in refill to [facility] as printed in medication list in chart.

## 2016-09-11 ENCOUNTER — Other Ambulatory Visit: Payer: Self-pay | Admitting: Family Medicine

## 2016-09-11 DIAGNOSIS — M5441 Lumbago with sciatica, right side: Secondary | ICD-10-CM

## 2016-09-11 DIAGNOSIS — G8929 Other chronic pain: Secondary | ICD-10-CM

## 2016-09-11 DIAGNOSIS — S32010K Wedge compression fracture of first lumbar vertebra, subsequent encounter for fracture with nonunion: Secondary | ICD-10-CM

## 2016-09-11 NOTE — Telephone Encounter (Signed)
Pt contacted office for refill request on the following medications:  HYDROcodone-acetaminophen (NORCO) 7.5-325 MG tablet.  CB#[phone]/MW °

## 2016-09-11 NOTE — Telephone Encounter (Signed)
Last ov 07/22/16 Last filled 08/19/16 Please review. Thank you. sd

## 2016-09-12 MED ORDER — HYDROCODONE-ACETAMINOPHEN 7.5-325 MG PO TABS: 1.0000 | ORAL_TABLET | Freq: Three times a day (TID) | ORAL | 0 refills | Status: DC | PRN

## 2016-09-12 NOTE — Telephone Encounter (Signed)
Prescription printed for pick up to send to [facility].

## 2016-09-20 ENCOUNTER — Telehealth: Payer: Self-pay | Admitting: Family Medicine

## 2016-09-20 NOTE — Telephone Encounter (Signed)
Agree with this plan. Thanks.

## 2016-09-20 NOTE — Telephone Encounter (Signed)
Spoke with patient and he just wanted to have this on his record that he was in the kitchen warming up some food and all of a sudden got sciatica pain in the left buttocks radiating down the leg and almost fell. Also he is having issues with his head again and he just wanted to go over all of this with you. I advised patient that you will not be in the office until Monday 09/23/16. Patient understood and was fine with that=aa

## 2016-09-20 NOTE — Telephone Encounter (Signed)
Pt would like nurse to call him.  He is having pain in his back.  PT CB# [phone]  Thanks Barth Kirksteri

## 2016-09-23 ENCOUNTER — Other Ambulatory Visit: Payer: Self-pay | Admitting: Family Medicine

## 2016-09-23 DIAGNOSIS — M47817 Spondylosis without myelopathy or radiculopathy, lumbosacral region: Secondary | ICD-10-CM

## 2016-09-23 DIAGNOSIS — G8929 Other chronic pain: Secondary | ICD-10-CM

## 2016-09-23 DIAGNOSIS — M5441 Lumbago with sciatica, right side: Secondary | ICD-10-CM

## 2016-09-23 MED ORDER — TIZANIDINE HCL 4 MG PO TABS: 4.0000 mg | ORAL_TABLET | Freq: Three times a day (TID) | ORAL | 1 refills | Status: DC

## 2016-09-23 NOTE — Telephone Encounter (Signed)
Pt states he is having anxiety and is requesting a call back from [facility]. CB#[phone]/MW

## 2016-09-23 NOTE — Telephone Encounter (Signed)
Patient wants to talk to you when you get back in the office I just wanted to make sure you saw that part-aa

## 2016-09-23 NOTE — Telephone Encounter (Signed)
He would not elaborate any further with Marcelino DusterMichelle he seemed aggravated and wanted to speak with you. Please advise. Thanks!

## 2016-09-23 NOTE — Telephone Encounter (Signed)
Wanted to relay episode of sudden sciatica pain into the left buttock down the left leg while cooking at home last week. Ran very low on his Norco and Clonazepam. Decided to use some "charcoal pills" and a laxative to flush out his bowels of the "back up of medicines". After bowel movement, he was feeling better and went back down to Clonazepam 1 mg TID and Norco 7.5/325 mg one tablet TID. Suspect he was having some withdrawal symptoms when he ran low on his medications after using extra dosages for this flare. Advised to not increase dosages and if he tapers down slowly, withdrawal symptoms should be less. Still says he does not want any "stronger medications or higher dosages" - "just don't want to hurt." Keep scheduled follow up appointment or sooner if needed.

## 2016-09-25 ENCOUNTER — Telehealth: Payer: Self-pay | Admitting: Family Medicine

## 2016-09-25 NOTE — Telephone Encounter (Signed)
Error/MW °

## 2016-09-27 ENCOUNTER — Telehealth: Payer: Self-pay | Admitting: Family Medicine

## 2016-09-27 ENCOUNTER — Ambulatory Visit: Payer: 59 | Admitting: Family Medicine

## 2016-09-27 NOTE — Telephone Encounter (Signed)
[facility]  He wants him to know he just ordered a walker.  Pt's call back is [phone]  Thanks teri

## 2016-09-27 NOTE — Telephone Encounter (Signed)
FYI. Thanks.

## 2016-09-27 NOTE — Telephone Encounter (Signed)
Patient has had some improvement in back pain using a brace and walker (with a seat and had brakes) for support with ambulating. Also, ordered an outdoor swing to be able to get out in the sunshine occasionally. Advised not to use the brace continuously. Try to do exercises with out the brace.

## 2016-10-07 ENCOUNTER — Ambulatory Visit (INDEPENDENT_AMBULATORY_CARE_PROVIDER_SITE_OTHER): Payer: 59 | Admitting: Family Medicine

## 2016-10-07 ENCOUNTER — Encounter: Payer: Self-pay | Admitting: Family Medicine

## 2016-10-07 VITALS — BP 122/84 | HR 88 | Temp 98.6°F | Wt 197.4 lb

## 2016-10-07 DIAGNOSIS — F410 Panic disorder [episodic paroxysmal anxiety] without agoraphobia: Secondary | ICD-10-CM | POA: Diagnosis not present

## 2016-10-07 DIAGNOSIS — M48062 Spinal stenosis, lumbar region with neurogenic claudication: Secondary | ICD-10-CM | POA: Diagnosis not present

## 2016-10-07 NOTE — Progress Notes (Signed)
Patient: David Reeves Male    DOB: 1970/07/16   46 y.o.   MRN: [id number] Visit Date: 10/07/2016  Today's Provider: Dortha Kern, PA   Chief Complaint  Patient presents with  . Back Pain  . Anxiety  . Follow-up   Subjective:    HPI  Anxiety Follow Up:  The patient was last seen for this on 07/22/2016. Patient advised to continue Clonazepam 1 mg TID for anxiety and control extreme panic. Patient reports good compliance with treatment. He feels the condition is worse since last visit. He is not having any side effects from medication. He is requesting a refill on Clonazepam.   Follow up for Chronic Back Pain: The patient was last seen for this on 07/22/2016.Changes made at last visit include continue Hydrocodone, Tizanidine and Celebrex, along with heating pad and back brace. Follow up with Dr. Newell Coral.He reports excellent compliance with treatment. He feels that condition is worsening. He is now using a rolling walker and cane for stability. He is not having side effects from medications. Patient has a follow up appointment with Dr. Newell Coral on 10/08/2016. He is requesting a refill on Hydrocodone. He states he has been taking Hydrocodone 7.5/325 mg QID instead of TID as prescribed. Patient states he notified Maurine Minister of change.   Past Medical History:  Diagnosis Date  . Back pain 2014  . Bone spur   . Bulging disc 2014  . Degenerative disc disease   . Osteoarthritis   . Panic anxiety syndrome   . Taking multiple medications for chronic disease    Past Surgical History:  Procedure Laterality Date  . HERNIA REPAIR Left 2006,2014   [facility]  . TOE SURGERY Right 2007   Family History  Problem Relation Age of Onset  . Diabetes Mother   . Diabetes Maternal Uncle   . Diabetes Maternal Grandmother   . Heart disease Maternal Grandmother    Allergies  Allergen Reactions  . Amitriptyline Other (See Comments)    Urine retention  . Buspirone Other (See Comments)    Kidney pain  .  Ciprofloxacin Other (See Comments)    hallucinations  . Doxepin Other (See Comments)    "Didn't feel right"  . Duloxetine Hcl Other (See Comments)    Sweating  . Escitalopram Other (See Comments)    Agitation  . Penicillins Other (See Comments)    Unknown -- childhood reaction  . Sertraline Other (See Comments)    Urine retention  . Trazodone Other (See Comments)    abd pain, urine retention   Previous Medications   CALCIUM CARB-CHOLECALCIFEROL (CALCIUM+D3) 600-800 MG-UNIT TABS    Take by mouth.   CELECOXIB (CELEBREX) 200 MG CAPSULE    Take 1 capsule by mouth  twice daily as needed   CHOLECALCIFEROL (VITAMIN D3) 2000 UNITS TABS    Take by mouth.   CLONAZEPAM (KLONOPIN) 1 MG TABLET    Take 1 tablet (1 mg total) by mouth every 8 (eight) hours.   GLUCOSAMINE-CHONDROIT-VIT C-MN (GLUCOSAMINE CHONDR 1500 COMPLX PO)    Take by mouth.   HYDROCODONE-ACETAMINOPHEN (NORCO) 7.5-325 MG TABLET    Take 1 tablet by mouth 3 (three) times daily as needed.   MULTIPLE VITAMIN (MULTIVITAMIN) TABLET    Take 1 tablet by mouth daily.   PROBIOTIC PRODUCT (PROBIOTIC COLON SUPPORT PO)    Take by mouth.   TIZANIDINE (ZANAFLEX) 4 MG TABLET    Take 1 tablet (4 mg total) by mouth 3 (three) times daily.  Review of Systems  Constitutional: Negative.   Respiratory: Negative.   Musculoskeletal: Positive for back pain.  Psychiatric/Behavioral: The patient is nervous/anxious.     Social History  Substance Use Topics  . Smoking status: Never Smoker  . Smokeless tobacco: Never Used  . Alcohol use No   Objective:   BP 122/84 (BP Location: Right Arm, Patient Position: Sitting, Cuff Size: Normal)   Pulse 88   Temp 98.6 F (37 C) (Oral)   Wt 197 lb 6.4 oz (89.5 kg)   SpO2 99%   BMI 26.59 kg/m   Physical Exam  Constitutional: He is oriented to person, place, and time. He appears well-developed and well-nourished. No distress.  HENT:  Head: Normocephalic and atraumatic.  Right Ear: Hearing normal.  Left  Ear: Hearing normal.  Nose: Nose normal.  Eyes: Conjunctivae and lids are normal. Right eye exhibits no discharge. Left eye exhibits no discharge. No scleral icterus.  Neck: Neck supple.  Cardiovascular: Normal rate and regular rhythm.   Pulmonary/Chest: Effort normal and breath sounds normal. No respiratory distress.  Abdominal: Soft. Bowel sounds are normal.  Musculoskeletal:  Lower back pressure pain with tenderness. Increased pain with transition from sitting to standing with stiffness and balance difficulties. Using back brace 24 hours a day now. Pain usually 4/10 with medication. Radiation of pain back of legs to ankles (L>R alternately). Feet get numb to sit on a toilet. SLR's causes pain to 80 degrees bilaterally. Unable to elicit DTR's. Good pulses bilaterally in feet.   Neurological: He is alert and oriented to person, place, and time.  Skin: Skin is intact. No lesion and no rash noted.  Psychiatric: His speech is normal. His mood appears anxious. He is aggressive. Thought content is paranoid.      Assessment & Plan:     1. Lumbar stenosis with neurogenic claudication Has purchased a rolling walker with hand brakes and a seat to ambulate around the house and yard. Allows him to sit and stand as needed for back pain and spasms. Pain 4/10 today with acute spasms with certain movements and transitioning from sitting to standing or standing to sitting. Pain radiates down the legs (alternates from L to R). Has an appointment to see Dr. Newell CoralNudelman (neurosurgeon) tomorrow to follow up on T12 compression fracture and multi-level degenerative disease with lumbar stenosis and neurogenic claudication. Pain halfway managed with Norco 7.5/325 mg one tablet 3-4 times a day, Celebrex 200 mg BID, Tizanidine 4 mg TID and back brace with intermittent heat applications. Tries to do some stretching exercises as chronic pain allows. Working on disability determination. Refill of Norco not due for a week (has 27  tablets in his bottle today). Recheck in 3-4 months.  2. Panic attack Anxiety level escalated and has had panic attacks with paranoia. Convinced some one has been moving things around his home and having conflicts with family. States he wants a "divorce" and to move out of the house, but he can't work with chronic back pain and could not support himself. Working on disability determination and has an appointment with a psychiatrist on 11-11-16 (can't remember name - has written down at home). Feels the Clonazepam 1 mg TID is the only thing that helps calm fears/panic and helps with some of the back pain, also. Recommend he keep the psychiatry appointment and keep the Clonazepam dose from escalating (has 51 tablets in his bottle today). Spent 30+ minutes counseling.

## 2016-10-08 ENCOUNTER — Telehealth: Payer: Self-pay | Admitting: Family Medicine

## 2016-10-08 NOTE — Telephone Encounter (Signed)
Pt went today to see his neuro surgeon and wants to talk with Maurine Ministerennis about the visit.  Pt's call back is [phone]  Thanks teri

## 2016-10-08 NOTE — Telephone Encounter (Signed)
Wanted to make us aware of his recheck with Dr. Newell CoralNudelman today. States his exam indicated the compression fracture of T12 was not healed and he is still not a good candidate for surgery. He will recheck with x-ray imaging in January 2019 to assess healing again. Recommended he not increase his Norco or the Clonazepam to more than 4 tablets daily. Presently encouraged him to limit to 3 tablets daily to control chronic pain and panic attacks. Keep follow up scheduled in 3-4 months.

## 2016-10-10 ENCOUNTER — Other Ambulatory Visit: Payer: Self-pay

## 2016-10-10 ENCOUNTER — Telehealth: Payer: Self-pay

## 2016-10-10 DIAGNOSIS — S32010K Wedge compression fracture of first lumbar vertebra, subsequent encounter for fracture with nonunion: Secondary | ICD-10-CM

## 2016-10-10 DIAGNOSIS — M5441 Lumbago with sciatica, right side: Secondary | ICD-10-CM

## 2016-10-10 DIAGNOSIS — F064 Anxiety disorder due to known physiological condition: Secondary | ICD-10-CM

## 2016-10-10 DIAGNOSIS — G8929 Other chronic pain: Secondary | ICD-10-CM

## 2016-10-10 MED ORDER — HYDROCODONE-ACETAMINOPHEN 7.5-325 MG PO TABS: 1.0000 | ORAL_TABLET | Freq: Three times a day (TID) | ORAL | 0 refills | Status: DC | PRN

## 2016-10-10 MED ORDER — CLONAZEPAM 1 MG PO TABS: 1.0000 mg | ORAL_TABLET | Freq: Three times a day (TID) | ORAL | 1 refills | Status: DC

## 2016-10-10 NOTE — Telephone Encounter (Signed)
Acknowledged.

## 2016-10-10 NOTE — Telephone Encounter (Signed)
RX called in at [facility] and printed RX placed at front desk for pick up. Patient advised.

## 2016-10-10 NOTE — Telephone Encounter (Signed)
Patient just wanted to let you know that with increasing the medication that you did for him he can tell a difference in the pain, it is much better. Just wanted to let you know-aa

## 2016-10-10 NOTE — Telephone Encounter (Signed)
Phone in refill of clonazepam to [facility] and printed Norco refill prescription for pick up

## 2016-10-17 ENCOUNTER — Ambulatory Visit: Payer: 59 | Admitting: Family Medicine

## 2016-10-18 ENCOUNTER — Ambulatory Visit: Payer: 59 | Admitting: Family Medicine

## 2016-10-21 ENCOUNTER — Telehealth: Payer: Self-pay | Admitting: Family Medicine

## 2016-10-21 NOTE — Telephone Encounter (Signed)
Called patient back to try and get details, patient states he [facility] to [facility]. Advised patient [facility] for [facility]-aa

## 2016-10-21 NOTE — Telephone Encounter (Signed)
Pt is requesting a call back to discuss reducing his pain medication.   Pt is having trouble using his right hand.  It is very weak.   CB#[phone]

## 2016-10-21 NOTE — Telephone Encounter (Signed)
Trying to decrease use of Norco 7.5-325 mg to one tablet TID today. Having to lay down to rest more. Also, having difficulty with weakness in the right hand recently. Unable to squeeze a potato chip bag clip open with the right hand without full effort. 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 States he wants to use an ice pack for swelling in the carpal tunnel first. Will notify us of progress in a few days.

## 2016-10-22 ENCOUNTER — Telehealth: Payer: Self-pay

## 2016-10-22 NOTE — Telephone Encounter (Signed)
OK as long as he keeps the appointment.

## 2016-10-22 NOTE — Telephone Encounter (Signed)
Patient called and wanted to let you know he is going to cancel appointment with Dr Maryruth BunKapur and is going to see [facility] psychiatrist on 11/27/16.-aa

## 2016-10-29 ENCOUNTER — Telehealth: Payer: Self-pay | Admitting: Family Medicine

## 2016-10-29 ENCOUNTER — Other Ambulatory Visit: Payer: Self-pay | Admitting: Family Medicine

## 2016-10-29 DIAGNOSIS — G8929 Other chronic pain: Secondary | ICD-10-CM

## 2016-10-29 DIAGNOSIS — S32010K Wedge compression fracture of first lumbar vertebra, subsequent encounter for fracture with nonunion: Secondary | ICD-10-CM

## 2016-10-29 DIAGNOSIS — F064 Anxiety disorder due to known physiological condition: Secondary | ICD-10-CM

## 2016-10-29 DIAGNOSIS — M5441 Lumbago with sciatica, right side: Secondary | ICD-10-CM

## 2016-10-29 MED ORDER — HYDROCODONE-ACETAMINOPHEN 7.5-325 MG PO TABS
1.0000 | ORAL_TABLET | Freq: Three times a day (TID) | ORAL | 0 refills | Status: DC | PRN
Start: 2016-10-29 — End: 2016-12-04

## 2016-10-29 MED ORDER — CLONAZEPAM 1 MG PO TABS: ORAL_TABLET | ORAL | 1 refills | Status: DC

## 2016-10-29 NOTE — Telephone Encounter (Signed)
Pt will need refill on   HYDROcodone-acetaminophen (NORCO) 7.5-325 MG tablet   clonazePAM (KLONOPIN) 1 MG tablet This rx needs to be oked to [facility].  He needs a early refill.  He is taking 4 a day instead of 3 a day.  He also would like you to call him back  [phone]  Thanks teri

## 2016-10-29 NOTE — Telephone Encounter (Signed)
Feeling less anxiety and better control of chronic pain on the Norco 7.5-325 mg one tablet TID and Clonazepam 1 mg 2 tablets each morning, 1 afternoon and 1 at bedtime. Wife brought him a some "CBD with THC from [employer] [employer]" for "vaping" after a trip to the beach last week. He tried it and had a better appetite. Also, was walking better with less anxiety/panic and feels a little stronger. Feels well enough to start tapering back on the Clonazepam and Norco. States he got disability approved to cover from October 09, 2012 to the present. Discussed medications with Dr. Lucianne MussLima who recommended he continue present regimen and scheduled a referral to Dr. Maryruth BunKapur (psychiatrist in [place], [place]) for October 2018. Advised to start the tapering process by cutting tablets in half and keeping the same schedule of taking them. Will refill and requested a call report as he tapers down.

## 2016-10-31 NOTE — Telephone Encounter (Signed)
10/30/16- Faxed Rx for Clonazepam to [facility] and called pt no answer LM advising Rx was ready for pick up (Hydrocodone). Thanks TNP

## 2016-11-04 ENCOUNTER — Telehealth: Payer: Self-pay | Admitting: Family Medicine

## 2016-11-04 NOTE — Telephone Encounter (Signed)
Pt is requesting a Rx to help him with sleeping at night.  Pt is also asking if he can get a Rx to help with pain.  [facility] mail order.  CB#[phone]/MW

## 2016-11-04 NOTE — Telephone Encounter (Signed)
Contacted patient to clarify message. He is wanting to know if there is a medication that will help with sleep and pain together that is not a opioid. Please advise.

## 2016-11-05 ENCOUNTER — Telehealth: Payer: Self-pay | Admitting: Family Medicine

## 2016-11-05 NOTE — Telephone Encounter (Signed)
Patient advised. He states he has been taking the Melatonin 10 mg. Last night he took Melatonin 10 mg along with 2 sleep aid tablets and sleep well. Patient states he just don't want to keep taking a lot of different medications for sleep.

## 2016-11-05 NOTE — Telephone Encounter (Signed)
Has had so many reactions to the types of medications used for this situation it is difficult to find a new one. Some supplements can be helpful in your situation. Melatonin 3-5 mg with Valerian Root 400 mg at bedtime helps you fall asleep and stay asleep. Tumeric 300-400 mg 2-3 times a day is a supplement to help with pain.

## 2016-11-05 NOTE — Telephone Encounter (Signed)
Advised patient to try the combination of the Melatonin, Valerian Root and Tumeric and wait until 11-07-16 to refill Norco. Patient still states he wants to decrease use of Norco/taper down on usage. Agrees to use these supplements for now.

## 2016-11-05 NOTE — Telephone Encounter (Signed)
Patient is also requesting that [facility] to give them the okay to fill the Hydrocodone RX early. Last refill was on 10/10/2016 and pharmacist said the earliest fill date is 11/07/2016. Patient states he needs the RX filled early because the dose was increased to 4 tablets daily.

## 2016-11-05 NOTE — Telephone Encounter (Signed)
Patient states that he needs a prior authorization for Clonazepam 1mg .  He states that the pharmacy has sent something to us yesterday afternoon about this.

## 2016-12-04 ENCOUNTER — Other Ambulatory Visit: Payer: Self-pay | Admitting: Family Medicine

## 2016-12-04 DIAGNOSIS — G8929 Other chronic pain: Secondary | ICD-10-CM

## 2016-12-04 DIAGNOSIS — M5441 Lumbago with sciatica, right side: Secondary | ICD-10-CM

## 2016-12-04 DIAGNOSIS — S32010K Wedge compression fracture of first lumbar vertebra, subsequent encounter for fracture with nonunion: Secondary | ICD-10-CM

## 2016-12-04 MED ORDER — HYDROCODONE-ACETAMINOPHEN 7.5-325 MG PO TABS: 1.0000 | ORAL_TABLET | Freq: Three times a day (TID) | ORAL | 0 refills | Status: DC | PRN

## 2016-12-04 NOTE — Telephone Encounter (Signed)
Ok I will print Rx for patient and will call Alycia RossettiRyan about the [employer]. Thanks!

## 2016-12-04 NOTE — Telephone Encounter (Signed)
Patient advised Rx is ready for pick up.

## 2016-12-04 NOTE — Telephone Encounter (Signed)
Pt contacted office for refill request on the following medications:  HYDROcodone-acetaminophen (NORCO) 7.5-325 MG tablet.  CB#[phone]/MW °

## 2016-12-04 NOTE — Telephone Encounter (Signed)
Sp[facility] with [facility]rist[facility] states [facility] has been filling Hydrocodone there each month. Last refill was on 7/19. She states she will leave a message for Fiservyan Artist(pharmacy manager) to report [facility] not being up to date for Hydrocodone refills and they will call us back.

## 2016-12-04 NOTE — Telephone Encounter (Signed)
N[facility] reviewed and no hydrocodone on file. Can we please [facility] to see if patient has been filling there. Thanks.

## 2016-12-04 NOTE — Telephone Encounter (Signed)
[name]'s patient Last RF 10/29/2016 and last OV 10/07/2016

## 2016-12-24 ENCOUNTER — Other Ambulatory Visit: Payer: Self-pay | Admitting: Family Medicine

## 2016-12-24 DIAGNOSIS — F064 Anxiety disorder due to known physiological condition: Secondary | ICD-10-CM

## 2016-12-24 MED ORDER — CLONAZEPAM 1 MG PO TABS: ORAL_TABLET | ORAL | 3 refills | Status: DC

## 2016-12-24 MED ORDER — CELECOXIB 200 MG PO CAPS: ORAL_CAPSULE | ORAL | 2 refills | Status: DC

## 2016-12-24 NOTE — Telephone Encounter (Signed)
Please phone in or fax Clonazepam as listed in refill record (#[id number] & [id number]) so [facility] send [facility] him once a [facility]. Already send Celebrex refills to them.

## 2016-12-24 NOTE — Telephone Encounter (Signed)
Pt contacted office for refill request on the following medications: 1. celecoxib (CELEBREX) 200 MG capsule  Last Rx:  04/08/16 with 2 refillls 2. clonazePAM (KLONOPIN) 1 MG tablet Last Rx: 10/29/16 with 1 refill LOV: 10/07/16  Pt is requesting 90 day supply on both medications [facility]. Pt stated that on the Clonazepam they will send it to him a month supply at a time but if it is sent in for 90 day supply he will still get the discount.  Please advise. Thanks TNP

## 2016-12-25 NOTE — Telephone Encounter (Signed)
RX called in at [facility]. Patient advised.  

## 2017-01-02 ENCOUNTER — Encounter: Payer: Self-pay | Admitting: Family Medicine

## 2017-01-02 ENCOUNTER — Ambulatory Visit (INDEPENDENT_AMBULATORY_CARE_PROVIDER_SITE_OTHER): Payer: 59 | Admitting: Family Medicine

## 2017-01-02 VITALS — BP 104/68 | HR 74 | Temp 98.5°F | Ht 73.0 in | Wt 215.8 lb

## 2017-01-02 DIAGNOSIS — M48062 Spinal stenosis, lumbar region with neurogenic claudication: Secondary | ICD-10-CM

## 2017-01-02 DIAGNOSIS — F410 Panic disorder [episodic paroxysmal anxiety] without agoraphobia: Secondary | ICD-10-CM

## 2017-01-02 DIAGNOSIS — S32010K Wedge compression fracture of first lumbar vertebra, subsequent encounter for fracture with nonunion: Secondary | ICD-10-CM | POA: Diagnosis not present

## 2017-01-02 MED ORDER — HYDROCODONE-ACETAMINOPHEN 7.5-325 MG PO TABS: 1.0000 | ORAL_TABLET | Freq: Three times a day (TID) | ORAL | 0 refills | Status: DC | PRN

## 2017-01-02 NOTE — Progress Notes (Signed)
Patient: David Reeves Male    DOB: 1971/03/03   46 y.o.   MRN: [id number] Visit Date: 01/02/2017  Today's Provider: Dortha Kernennis Josiephine Simao, PA   Chief Complaint  Patient presents with  . Back Pain  . Anxiety  . Follow-up   Subjective:    HPI Anxiety Follow Up:  The patient was last seen for this on 10/07/2016. Patient advised to continue Clonazepam 1 mg for anxiety and control extreme panic. Patient reports good compliance with treatment. He feels the condition is stable now with the Clonazepam QID PRN. He is not having any side effects from medication. Recommended he keep the psychiatrist appointment. He reports good compliance with treatment plan. He has a appointment scheduled for 01/20/17.   Follow up for Chronic Back Pain: The patient was last seen for this on 10/07/2016. No changes made at last visit. Advised to continue Hydrocodone, Tizanidine and Celebrex, along with heating pad and back brace. Follow up with Dr. Newell CoralNudelman. Hereports excellentcompliance with treatment. He is experiencing right side sciatica today. He is using only a cane for stability today. Heis nothaving side effects from medications. He is requesting a refill. He has a 4 day supply left.   Past Medical History:  Diagnosis Date  . Back pain 2014  . Bone spur   . Bulging disc 2014  . Degenerative disc disease   . Osteoarthritis   . Panic anxiety syndrome   . Taking multiple medications for chronic disease    Past Surgical History:  Procedure Laterality Date  . HERNIA REPAIR Left 2006,2014   [facility]  . TOE SURGERY Right 2007   Family History  Problem Relation Age of Onset  . Diabetes Mother   . Diabetes Maternal Uncle   . Diabetes Maternal Grandmother   . Heart disease Maternal Grandmother    Allergies  Allergen Reactions  . Amitriptyline Other (See Comments)    Urine retention  . Buspirone Other (See Comments)    Kidney pain  . Ciprofloxacin Other (See Comments)    hallucinations  . Doxepin  Other (See Comments)    "Didn't feel right"  . Duloxetine Hcl Other (See Comments)    Sweating  . Escitalopram Other (See Comments)    Agitation  . Penicillins Other (See Comments)    Unknown -- childhood reaction  . Sertraline Other (See Comments)    Urine retention  . Trazodone Other (See Comments)    abd pain, urine retention   Previous Medications   CALCIUM CARB-CHOLECALCIFEROL (CALCIUM+D3) 600-800 MG-UNIT TABS    Take by mouth.   CELECOXIB (CELEBREX) 200 MG CAPSULE    Take 1 capsule by mouth  twice daily as needed   CHOLECALCIFEROL (VITAMIN D3) 2000 UNITS TABS    Take by mouth.   CLONAZEPAM (KLONOPIN) 1 MG TABLET    Take 2 tablets in the morning, 1 afternoon and 1 at bedtime by mouth as needed for anxiety/panic.   GLUCOSAMINE-CHONDROIT-VIT C-MN (GLUCOSAMINE CHONDR 1500 COMPLX PO)    Take by mouth.   MULTIPLE VITAMIN (MULTIVITAMIN) TABLET    Take 1 tablet by mouth daily.   PROBIOTIC PRODUCT (PROBIOTIC COLON SUPPORT PO)    Take by mouth.   TIZANIDINE (ZANAFLEX) 4 MG TABLET    Take 1 tablet (4 mg total) by mouth 3 (three) times daily.    Review of Systems  Constitutional: Negative.   Respiratory: Negative.   Cardiovascular: Negative.   Psychiatric/Behavioral: The patient is nervous/anxious.     Social History  Substance Use  Topics  . Smoking status: Never Smoker  . Smokeless tobacco: Never Used  . Alcohol use No   Objective:   BP 104/68 (BP Location: Right Arm, Patient Position: Sitting, Cuff Size: Normal)   Pulse 74   Temp 98.5 F (36.9 C) (Oral)   Ht  (1.854 m)   Wt 215 lb 12.8 oz (97.9 kg)   SpO2 96%   BMI 28.47 kg/m  Wt Readings from Last 3 Encounters:  01/02/17 215 lb 12.8 oz (97.9 kg)  10/07/16 197 lb 6.4 oz (89.5 kg)  07/22/16 205 lb (93 kg)    Physical Exam  Constitutional: He is oriented to person, place, and time. He appears well-developed.  HENT:  Head: Normocephalic.  Right Ear: External ear normal.  Left Ear: External ear normal.  Nose:  Nose normal.  Mouth/Throat: Oropharynx is clear and moist.  Eyes: Pupils are equal, round, and reactive to light. Conjunctivae and EOM are normal.  Neck: Neck supple.  Cardiovascular: Normal rate and regular rhythm.   Pulmonary/Chest: Effort normal and breath sounds normal.  Abdominal: Soft. Bowel sounds are normal.  Musculoskeletal: He exhibits tenderness.  Right low back and radiation down the right posterior leg. Unable to elicit DTR's in knees and ankles bilaterally - 2+ bilateral upper extremities. SLR's 80 degrees with pain radiating up to right low back. Sitting on toilet too long causes numbness in right lower leg.  Neurological: He is alert and oriented to person, place, and time.  Psychiatric: He has a normal mood and affect.  Moody when panic occurs or pain intensifies. Calm and collected today. Appropriate affect.   Depression screen [facility] 2/9 01/02/2017  Decreased Interest 2  Down, Depressed, Hopeless 0  PHQ - 2 Score 2  Altered sleeping 0  Tired, decreased energy 2  Change in appetite 1  Feeling bad or failure about yourself  0  Trouble concentrating 2  Moving slowly or fidgety/restless 0  Suicidal thoughts 0  PHQ-9 Score 7  Difficult doing work/chores Somewhat difficult    Assessment & Plan:     1. Lumbar stenosis with neurogenic claudication Pain control improved with better control of anxiety/panic attacks. Still walks some in his yard but has pain and spasm with radicular pain down the right leg. Denies constipation or significant sleep disturbance. Continues to use heating pad and lumbar brace. Encouraged to proceed with neurosurgical follow up (Dr. Newell Coral) as planned. Recheck CBC and CMP with follow up in 3 months. Reviewed [employer] record of controlled substance use. No sign of extraneous use or attempt to procure sources outside of this practice. - HYDROcodone-acetaminophen (NORCO) 7.5-325 MG tablet; Take 1 tablet by mouth 3 (three) times daily as needed.  Dispense:  90 tablet; Refill: 0 - CBC with Differential/Platelet - Comprehensive metabolic panel  2. 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 The addition of one dose of Clonazepam allowed him to decrease Norco back down. Continues to use back brace frequently. Encouraged to follow up with his neurosurgeon as planned in October. - HYDROcodone-acetaminophen (NORCO) 7.5-325 MG tablet; Take 1 tablet by mouth 3 (three) times daily as needed.  Dispense: 90 tablet; Refill: 0 - CBC with Differential/Platelet - Comprehensive metabolic panel  3. Panic attack Feeling much more calm and able to cope with chronic pain. Pain level less and sleeping better without as much sudden panic attacks since increasing the Clonazepam 1 mg to 2 tablet in the morning, 1  afternoon and 1 at bedtime. Has an appointment with his psychiatrist on 01-20-17. Will recheck CBC and CMP. Continue present dosage for now. - CBC with Differential/Platelet - Comprehensive metabolic panel

## 2017-01-03 LAB — CBC WITH DIFFERENTIAL/PLATELET
Basophils Absolute: 0 10*3/uL (ref 0.0–0.2)
Basos: 1 %
EOS (ABSOLUTE): 0.1 10*3/uL (ref 0.0–0.4)
Eos: 2 %
Hematocrit: 42.4 % (ref 37.5–51.0)
Hemoglobin: 14.5 g/dL (ref 13.0–17.7)
Immature Grans (Abs): 0 10*3/uL (ref 0.0–0.1)
Immature Granulocytes: 0 %
Lymphocytes Absolute: 2 10*3/uL (ref 0.7–3.1)
Lymphs: 34 %
MCH: 32 pg (ref 26.6–33.0)
MCHC: 34.2 g/dL (ref 31.5–35.7)
MCV: 94 fL (ref 79–97)
Monocytes Absolute: 0.5 10*3/uL (ref 0.1–0.9)
Monocytes: 8 %
Neutrophils Absolute: 3.2 10*3/uL (ref 1.4–7.0)
Neutrophils: 55 %
Platelets: 282 10*3/uL (ref 150–379)
RBC: 4.53 x10E6/uL (ref 4.14–5.80)
RDW: 13.1 % (ref 12.3–15.4)
WBC: 5.8 10*3/uL (ref 3.4–10.8)

## 2017-01-03 LAB — COMPREHENSIVE METABOLIC PANEL
ALT: 17 IU/L (ref 0–44)
AST: 17 IU/L (ref 0–40)
Albumin/Globulin Ratio: 1.8 (ref 1.2–2.2)
Albumin: 4.3 g/dL (ref 3.5–5.5)
Alkaline Phosphatase: 58 IU/L (ref 39–117)
BUN/Creatinine Ratio: 13 (ref 9–20)
BUN: 10 mg/dL (ref 6–24)
Bilirubin Total: 0.3 mg/dL (ref 0.0–1.2)
CO2: 24 mmol/L (ref 20–29)
Calcium: 9.5 mg/dL (ref 8.7–10.2)
Chloride: 102 mmol/L (ref 96–106)
Creatinine, Ser: 0.79 mg/dL (ref 0.76–1.27)
GFR calc Af Amer: 124 mL/min/{1.73_m2} (ref >59)
GFR calc non Af Amer: 108 mL/min/{1.73_m2} (ref >59)
Globulin, Total: 2.4 g/dL (ref 1.5–4.5)
Glucose: 90 mg/dL (ref 65–99)
Potassium: 4.4 mmol/L (ref 3.5–5.2)
Sodium: 140 mmol/L (ref 134–144)
Total Protein: 6.7 g/dL (ref 6.0–8.5)

## 2017-01-07 ENCOUNTER — Ambulatory Visit: Payer: 59 | Admitting: Family Medicine

## 2017-01-25 ENCOUNTER — Other Ambulatory Visit: Payer: Self-pay | Admitting: Family Medicine

## 2017-01-29 ENCOUNTER — Telehealth: Payer: Self-pay | Admitting: Family Medicine

## 2017-01-29 NOTE — Telephone Encounter (Signed)
Pt contacted office for refill request on the following medications:  HYDROcodone-acetaminophen (NORCO) 7.5-325 MG tablet.  CB#[phone]/MW °

## 2017-01-30 ENCOUNTER — Other Ambulatory Visit: Payer: Self-pay | Admitting: Family Medicine

## 2017-01-30 DIAGNOSIS — S32010K Wedge compression fracture of first lumbar vertebra, subsequent encounter for fracture with nonunion: Secondary | ICD-10-CM

## 2017-01-30 DIAGNOSIS — M48062 Spinal stenosis, lumbar region with neurogenic claudication: Secondary | ICD-10-CM

## 2017-01-30 MED ORDER — HYDROCODONE-ACETAMINOPHEN 7.5-325 MG PO TABS: 1.0000 | ORAL_TABLET | Freq: Three times a day (TID) | ORAL | 0 refills | Status: DC | PRN

## 2017-01-30 NOTE — Telephone Encounter (Signed)
Printed prescription for pick up tomorrow.

## 2017-02-28 ENCOUNTER — Other Ambulatory Visit: Payer: Self-pay | Admitting: Family Medicine

## 2017-02-28 DIAGNOSIS — S32010K Wedge compression fracture of first lumbar vertebra, subsequent encounter for fracture with nonunion: Secondary | ICD-10-CM

## 2017-02-28 DIAGNOSIS — M48062 Spinal stenosis, lumbar region with neurogenic claudication: Secondary | ICD-10-CM

## 2017-02-28 MED ORDER — HYDROCODONE-ACETAMINOPHEN 7.5-325 MG PO TABS: 1.0000 | ORAL_TABLET | Freq: Three times a day (TID) | ORAL | 0 refills | Status: DC | PRN

## 2017-02-28 NOTE — Telephone Encounter (Signed)
Patient advised RX is ready for pick up.  

## 2017-02-28 NOTE — Telephone Encounter (Signed)
Patient is requesting a refill on HYDROcodone-acetaminophen (NORCO) 7.5-325 MG tablet.  Call patient when ready

## 2017-03-20 ENCOUNTER — Other Ambulatory Visit: Payer: Self-pay | Admitting: Family Medicine

## 2017-03-20 DIAGNOSIS — F064 Anxiety disorder due to known physiological condition: Secondary | ICD-10-CM

## 2017-03-20 NOTE — Telephone Encounter (Signed)
[facility] faxed refill request for the following medications:  clonazePAM (KLONOPIN) 1 MG tablet  90 day supply  Last Rx: 12/24/16 with 3 refills LOV: 01/02/17 Please advise. Thanks TNP

## 2017-03-21 MED ORDER — CLONAZEPAM 1 MG PO TABS: ORAL_TABLET | ORAL | 3 refills | Status: DC

## 2017-03-21 NOTE — Telephone Encounter (Signed)
Printed refill for pick up or phone in.

## 2017-03-21 NOTE — Telephone Encounter (Signed)
Faxed RX to [facility].

## 2017-03-28 ENCOUNTER — Other Ambulatory Visit: Payer: Self-pay | Admitting: Family Medicine

## 2017-03-28 ENCOUNTER — Telehealth: Payer: Self-pay | Admitting: Family Medicine

## 2017-03-28 DIAGNOSIS — S32010K Wedge compression fracture of first lumbar vertebra, subsequent encounter for fracture with nonunion: Secondary | ICD-10-CM

## 2017-03-28 DIAGNOSIS — M48062 Spinal stenosis, lumbar region with neurogenic claudication: Secondary | ICD-10-CM

## 2017-03-28 MED ORDER — HYDROCODONE-ACETAMINOPHEN 7.5-325 MG PO TABS: 1.0000 | ORAL_TABLET | Freq: Three times a day (TID) | ORAL | 0 refills | Status: DC | PRN

## 2017-03-28 NOTE — Telephone Encounter (Signed)
Printed prescription left at the front desk. Doubt we will be closed on Tuesday.

## 2017-03-28 NOTE — Telephone Encounter (Signed)
Patient advised. Follow up scheduled for 12/17 just in case the office is delayed on Tuesday

## 2017-03-28 NOTE — Telephone Encounter (Signed)
Pt contacted office for refill request on the following medications:  HYDROcodone-acetaminophen (NORCO) 7.5-325 MG tablet  Pt has OV on 04/01/17 but with the weather pt is concerned our office will be closed and he will run out of medication. Pt is requesting to pick up an Rx today if possible so he doesn't run out of medication if we are closed. Please advise. Thanks TNP

## 2017-04-01 ENCOUNTER — Ambulatory Visit: Payer: Medicare Other | Admitting: Family Medicine

## 2017-04-07 ENCOUNTER — Ambulatory Visit (INDEPENDENT_AMBULATORY_CARE_PROVIDER_SITE_OTHER): Payer: 59 | Admitting: Family Medicine

## 2017-04-07 ENCOUNTER — Encounter: Payer: Self-pay | Admitting: Family Medicine

## 2017-04-07 VITALS — BP 132/78 | HR 96 | Temp 98.8°F | Resp 16 | Wt 205.0 lb

## 2017-04-07 DIAGNOSIS — M48062 Spinal stenosis, lumbar region with neurogenic claudication: Secondary | ICD-10-CM | POA: Diagnosis not present

## 2017-04-07 DIAGNOSIS — F410 Panic disorder [episodic paroxysmal anxiety] without agoraphobia: Secondary | ICD-10-CM | POA: Diagnosis not present

## 2017-04-07 NOTE — Progress Notes (Signed)
Patient: David Reeves Male    DOB: 08-20-1970   46 y.o.   MRN: [id number] Visit Date: 04/07/2017  Today's Provider: Dortha Kern, PA   Chief Complaint  Patient presents with  . Back Pain    Three month follow  . Panic Attack    Three Month Follow   Subjective:   Back pain: Continues to have pain in the 4-6/10 range all day. Use of Celebrex and Norco with Zanaflex, moist heat applications and back brace support has helped to control pain better. Unable to lift or carry anything much without more acute pain. Activity level low due to pain. Has had disability confirmed and received monetary supplementation. This has helped to relieve some of his anxiety and worry about caring for his family.  Anxiety/panic disorder: Sleeping as well as possible with chronic pain. Still has some panic attacks but controlled by Clonazepam fairly well. Frustration and irritability better with recent initiation of disability coverage.    Dictation #1 [id number]  [id number] Past Medical History:  Diagnosis Date  . Back pain 2014  . Bone spur   . Bulging disc 2014  . Degenerative disc disease   . Osteoarthritis   . Panic anxiety syndrome   . Taking multiple medications for chronic disease    Past Surgical History:  Procedure Laterality Date  . HERNIA REPAIR Left 2006,2014   [facility]  . TOE SURGERY Right 2007   Family History  Problem Relation Age of Onset  . Diabetes Mother   . Diabetes Maternal Uncle   . Diabetes Maternal Grandmother   . Heart disease Maternal Grandmother    Allergies  Allergen Reactions  . Amitriptyline Other (See Comments)    Urine retention  . Buspirone Other (See Comments)    Kidney pain  . Ciprofloxacin Other (See Comments)    hallucinations  . Doxepin Other (See Comments)    "Didn't feel right"  . Duloxetine Hcl Other (See Comments)    Sweating  . Escitalopram Other (See Comments)    Agitation  . Penicillins Other (See Comments)    Unknown  -- childhood reaction  . Sertraline Other (See Comments)    Urine retention  . Trazodone Other (See Comments)    abd pain, urine retention    Current Outpatient Medications:  .  Calcium Carb-Cholecalciferol (CALCIUM+D3) 600-800 MG-UNIT TABS, Take by mouth., Disp: , Rfl:  .  celecoxib (CELEBREX) 200 MG capsule, Take 1 capsule by mouth  twice daily as needed, Disp: 180 capsule, Rfl: 2 .  Cholecalciferol (VITAMIN D3) 2000 units TABS, Take by mouth., Disp: , Rfl:  .  clonazePAM (KLONOPIN) 1 MG tablet, Take 2 tablets in the morning, 1 afternoon and 1 at bedtime by mouth as needed for anxiety/panic., Disp: 100 tablet, Rfl: 3 .  Glucosamine-Chondroit-Vit C-Mn (GLUCOSAMINE CHONDR 1500 COMPLX PO), Take by mouth., Disp: , Rfl:  .  HYDROcodone-acetaminophen (NORCO) 7.5-325 MG tablet, Take 1 tablet by mouth 3 (three) times daily as needed., Disp: 90 tablet, Rfl: 0 .  Multiple Vitamin (MULTIVITAMIN) tablet, Take 1 tablet by mouth daily., Disp: , Rfl:  .  Probiotic Product (PROBIOTIC COLON SUPPORT PO), Take by mouth., Disp: , Rfl:  .  tiZANidine (ZANAFLEX) 4 MG tablet, TAKE 1 TABLET BY MOUTH 3  TIMES DAILY, Disp: 270 tablet, Rfl: 1  Review of Systems  Constitutional: Positive for appetite change (Pt reports not having much of an appetite ). Negative for activity change, chills, diaphoresis, fatigue, fever and unexpected  weight change.  Respiratory: Negative for shortness of breath.   Cardiovascular: Negative.  Negative for chest pain.  Gastrointestinal: Negative.  Negative for bowel incontinence and nausea.  Genitourinary: Negative for bladder incontinence.  Musculoskeletal: Positive for back pain. Negative for arthralgias, gait problem, joint swelling, myalgias, neck pain and neck stiffness.  Neurological: Negative for dizziness, light-headedness and headaches.  Psychiatric/Behavioral: Negative for agitation, behavioral problems, confusion, decreased concentration, dysphoric mood, hallucinations,  self-injury, sleep disturbance and suicidal ideas. The patient is nervous/anxious. The patient does not have insomnia and is not hyperactive.    Social History   Tobacco Use  . Smoking status: Never Smoker  . Smokeless tobacco: Never Used  Substance Use Topics  . Alcohol use: No   Objective:   BP 132/78 (BP Location: Right Arm, Patient Position: Sitting, Cuff Size: Normal)   Pulse 96   Temp 98.8 F (37.1 C) (Oral)   Resp 16   Wt 205 lb (93 kg)   BMI 27.05 kg/m   Wt Readings from Last 3 Encounters:  04/07/17 205 lb (93 kg)  01/02/17 215 lb 12.8 oz (97.9 kg)  10/07/16 197 lb 6.4 oz (89.5 kg)   Vitals:   04/07/17 1117  BP: 132/78  Pulse: 96  Resp: 16  Temp: 98.8 F (37.1 C)  TempSrc: Oral  Weight: 205 lb (93 kg)   Physical Exam  Constitutional: He is oriented to person, place, and time. He appears well-developed and well-nourished. No distress.  HENT:  Head: Normocephalic and atraumatic.  Right Ear: Hearing and external ear normal.  Left Ear: Hearing and external ear normal.  Nose: Nose normal.  Mouth/Throat: Oropharynx is clear and moist.  Eyes: Conjunctivae and lids are normal. Right eye exhibits no discharge. Left eye exhibits no discharge. No scleral icterus.  Neck: Neck supple.  Cardiovascular: Normal rate and regular rhythm.  Pulmonary/Chest: Effort normal and breath sounds normal. No respiratory distress.  Abdominal: Soft. Bowel sounds are normal.  Musculoskeletal:  Poor ROM due to chronic pain worsened by flexion. SLR's to 80 degrees causes more pain in the lower back on the right with radiation down the posterior right leg.  Neurological: He is alert and oriented to person, place, and time. He has normal strength.  Reflex Scores:      Tricep reflexes are 1+ on the right side and 1+ on the left side.      Bicep reflexes are 1+ on the right side and 1+ on the left side.      Patellar reflexes are 0 on the right side and 0 on the left side.      Achilles  reflexes are 0 on the right side and 0 on the left side. Skin: Skin is intact. No lesion and no rash noted.  Psychiatric: His speech is normal and behavior is normal. Thought content normal. His mood appears anxious.      Assessment & Plan:     1. Lumbar stenosis with neurogenic claudication Chronic pain 4-5/10 with acute spasms with almost any activity. Has started disability now. Still unable to work due to chronic pain. Continues Celebrex 200 mg BID, Zanaflex 4 mg TID and Norco 7.5-325 mg TID prn acute pain. Still using back brace, heating pad and rolling walker with a seat for ambulation around the home or to the grocery store. Can't lift much without triggering acute pain and spasm. Will continue present regimen and follow up in 3 months. Insurance will change as of April 22, 2017 and require prescriptions  to go to [facility] [address]  2. Panic attack [place] and irritable when acute pain and panic occurs. Clonazepam 1 mg 2 tablets in the morning, 1 afternoon and 1 at bedtime as needed has helped to control symptoms. Declines return to psychiatrist and does not want to take any additional medications now. Recheck in 3 months.       Dortha Kernennis Ranson Belluomini, PA  [facility]

## 2017-04-24 ENCOUNTER — Telehealth: Payer: Self-pay | Admitting: Family Medicine

## 2017-04-24 NOTE — Telephone Encounter (Signed)
Patient wanted to let us know he was having some discomfort in a right leg tendon at the dog bite site from April 2017. States he looked up the antibiotic used (Cipro) and found some people had tendon problems from this antibiotic. Just wanted to let me know of this information. States the tendon is better now.

## 2017-04-24 NOTE — Telephone Encounter (Signed)
Pt wants to discuss triggers of toxicity from a medication perscribed.  States he has an episode from last year until now.   Pt states the episodes makes him want to stop taking his medication

## 2017-04-30 ENCOUNTER — Telehealth: Payer: Self-pay | Admitting: Family Medicine

## 2017-04-30 NOTE — Telephone Encounter (Signed)
Patient is requesting refill on his HYDROcodone-acetaminophen (NORCO) 7.5-325 MG tablet he states that he has 3 days left  Wanted to know if you will call him, he has something to discuss with you

## 2017-05-01 ENCOUNTER — Other Ambulatory Visit: Payer: Self-pay | Admitting: Family Medicine

## 2017-05-01 DIAGNOSIS — M48062 Spinal stenosis, lumbar region with neurogenic claudication: Secondary | ICD-10-CM

## 2017-05-01 DIAGNOSIS — S32010K Wedge compression fracture of first lumbar vertebra, subsequent encounter for fracture with nonunion: Secondary | ICD-10-CM

## 2017-05-01 MED ORDER — HYDROCODONE-ACETAMINOPHEN 7.5-325 MG PO TABS: 1.0000 | ORAL_TABLET | Freq: Three times a day (TID) | ORAL | 0 refills | Status: DC | PRN

## 2017-05-01 NOTE — Telephone Encounter (Signed)
Patient advised he wants to continue efforts to taper down on the Norco. Will put refill at the front desk for pick up. Encouraged to continue taper and use Tizanidine for spasms with Celebrex for inflammation.

## 2017-05-08 ENCOUNTER — Emergency Department
Admission: EM | Admit: 2017-05-08 | Discharge: 2017-05-09 | Disposition: A | Discharge status: Other Acute Inpatient Hospital | Payer: Managed Care, Other (non HMO) | Attending: Emergency Medicine | Admitting: Emergency Medicine

## 2017-05-08 ENCOUNTER — Other Ambulatory Visit: Payer: Self-pay

## 2017-05-08 DIAGNOSIS — F23 Brief psychotic disorder: Secondary | ICD-10-CM

## 2017-05-08 DIAGNOSIS — F22 Delusional disorders: Secondary | ICD-10-CM | POA: Diagnosis present

## 2017-05-08 DIAGNOSIS — F410 Panic disorder [episodic paroxysmal anxiety] without agoraphobia: Secondary | ICD-10-CM | POA: Diagnosis present

## 2017-05-08 DIAGNOSIS — F1220 Cannabis dependence, uncomplicated: Secondary | ICD-10-CM

## 2017-05-08 DIAGNOSIS — Z79899 Other long term (current) drug therapy: Secondary | ICD-10-CM | POA: Insufficient documentation

## 2017-05-08 DIAGNOSIS — F29 Unspecified psychosis not due to a substance or known physiological condition: Secondary | ICD-10-CM | POA: Diagnosis not present

## 2017-05-08 DIAGNOSIS — M5441 Lumbago with sciatica, right side: Secondary | ICD-10-CM | POA: Diagnosis present

## 2017-05-08 DIAGNOSIS — G894 Chronic pain syndrome: Secondary | ICD-10-CM | POA: Diagnosis present

## 2017-05-08 DIAGNOSIS — G8929 Other chronic pain: Secondary | ICD-10-CM | POA: Diagnosis present

## 2017-05-08 DIAGNOSIS — Z5181 Encounter for therapeutic drug level monitoring: Secondary | ICD-10-CM | POA: Diagnosis not present

## 2017-05-08 LAB — COMPREHENSIVE METABOLIC PANEL
ALT: 26 U/L (ref 17–63)
AST: 34 U/L (ref 15–41)
Albumin: 5.3 g/dL — ABNORMAL HIGH (ref 3.5–5.0)
Alkaline Phosphatase: 48 U/L (ref 38–126)
Anion gap: 11 (ref 5–15)
BUN: 7 mg/dL (ref 6–20)
CO2: 24 mmol/L (ref 22–32)
Calcium: 9.4 mg/dL (ref 8.9–10.3)
Chloride: 103 mmol/L (ref 101–111)
Creatinine, Ser: 0.75 mg/dL (ref 0.61–1.24)
GFR calc Af Amer: >60 mL/min (ref >60)
GFR calc non Af Amer: >60 mL/min (ref >60)
Glucose, Bld: 129 mg/dL — ABNORMAL HIGH (ref 65–99)
Potassium: 3.3 mmol/L — ABNORMAL LOW (ref 3.5–5.1)
Sodium: 138 mmol/L (ref 135–145)
Total Bilirubin: 1.1 mg/dL (ref 0.3–1.2)
Total Protein: 8.3 g/dL — ABNORMAL HIGH (ref 6.5–8.1)

## 2017-05-08 LAB — CBC
HCT: 46.8 % (ref 40.0–52.0)
Hemoglobin: 15.9 g/dL (ref 13.0–18.0)
MCH: 32 pg (ref 26.0–34.0)
MCHC: 34 g/dL (ref 32.0–36.0)
MCV: 93.9 fL (ref 80.0–100.0)
Platelets: 295 10*3/uL (ref 150–440)
RBC: 4.99 MIL/uL (ref 4.40–5.90)
RDW: 12.9 % (ref 11.5–14.5)
WBC: 7.7 10*3/uL (ref 3.8–10.6)

## 2017-05-08 LAB — URINE DRUG SCREEN, QUALITATIVE (ARMC ONLY)
Amphetamines, Ur Screen: NOT DETECTED
Barbiturates, Ur Screen: NOT DETECTED
Benzodiazepine, Ur Scrn: POSITIVE — AB
Cannabinoid 50 Ng, Ur ~~LOC~~: POSITIVE — AB
Cocaine Metabolite,Ur ~~LOC~~: NOT DETECTED
MDMA (Ecstasy)Ur Screen: NOT DETECTED
Methadone Scn, Ur: NOT DETECTED
Opiate, Ur Screen: POSITIVE — AB
Phencyclidine (PCP) Ur S: NOT DETECTED
Tricyclic, Ur Screen: NOT DETECTED

## 2017-05-08 LAB — ETHANOL: Alcohol, Ethyl (B): <10 mg/dL (ref <10)

## 2017-05-08 LAB — ACETAMINOPHEN LEVEL: Acetaminophen (Tylenol), Serum: <10 ug/mL — ABNORMAL LOW (ref 10–30)

## 2017-05-08 LAB — SALICYLATE LEVEL: Salicylate Lvl: <7 mg/dL (ref 2.8–30.0)

## 2017-05-08 MED ORDER — VITAMIN B-1 100 MG PO TABS
100.0000 mg | ORAL_TABLET | Freq: Every day | ORAL | Status: DC
  Administered 2017-05-09: 100 mg via ORAL
  Filled 2017-05-08: qty 1

## 2017-05-08 MED ORDER — LORAZEPAM 2 MG/ML IJ SOLN: 0.0000 mg | Freq: Four times a day (QID) | INTRAMUSCULAR | Status: DC

## 2017-05-08 MED ORDER — LORAZEPAM 2 MG PO TABS
0.0000 mg | ORAL_TABLET | Freq: Two times a day (BID) | ORAL | Status: DC
  Administered 2017-05-09: 2 mg via ORAL

## 2017-05-08 MED ORDER — OXYCODONE-ACETAMINOPHEN 5-325 MG PO TABS
1.0000 | ORAL_TABLET | Freq: Once | ORAL | Status: AC
  Administered 2017-05-08: 1 via ORAL
  Filled 2017-05-08: qty 1

## 2017-05-08 MED ORDER — LORAZEPAM 2 MG PO TABS
0.0000 mg | ORAL_TABLET | Freq: Four times a day (QID) | ORAL | Status: DC
  Administered 2017-05-09 (×2): 2 mg via ORAL
  Filled 2017-05-08 (×3): qty 1

## 2017-05-08 MED ORDER — CLONAZEPAM 1 MG PO TABS
1.0000 mg | ORAL_TABLET | Freq: Three times a day (TID) | ORAL | Status: DC
  Administered 2017-05-08 – 2017-05-09 (×3): 1 mg via ORAL
  Filled 2017-05-08: qty 1
  Filled 2017-05-08: qty 2
  Filled 2017-05-08 (×2): qty 1

## 2017-05-08 MED ORDER — LORAZEPAM 2 MG/ML IJ SOLN: 0.0000 mg | Freq: Two times a day (BID) | INTRAMUSCULAR | Status: DC

## 2017-05-08 MED ORDER — THIAMINE HCL 100 MG/ML IJ SOLN: 100.0000 mg | Freq: Every day | INTRAMUSCULAR | Status: DC

## 2017-05-08 NOTE — ED Notes (Signed)
ED Provider at bedside. 

## 2017-05-08 NOTE — ED Notes (Signed)
Pt. Transferred to [facility] from ED to room 3 after screening for contraband. Report to include Situation, Background, Assessment and Recommendations from [facility]. Pt. Oriented to unit including Q15 minute rounds as well as the security cameras for their protection. Patient is alert and oriented, warm and dry in no acute distress. Patient denies SI, HI, and AVH. Pt. Encouraged to let me know if needs arise.

## 2017-05-08 NOTE — ED Notes (Signed)
meds given as ordered and verified by this Rn with MD. Pt reports not feeling right. CIWA scale does not neccesitate any extra medications at this time. [facility] and pt is aware.

## 2017-05-08 NOTE — ED Provider Notes (Addendum)
[facility] Emergency Department Provider Note  ____________________________________________   I have reviewed the triage vital signs and the nursing notes. Where available I have reviewed prior notes and, if possible and indicated, outside hospital notes.    HISTORY  Chief Complaint Behavior Problem    HPI David Reeves is a 47 y.o. male with a history of anxiety syndrome, chronic pain, chronic benzo use, chronic opiate use, states he is trying to wean himself gradually off of his controlled substances..  However, he states further that he is concerned because his opiates and benzos have been "tampered with."  Feels a "compromised" and he wants to only get them from us here in the hospital.  He states he has been feeling somewhat paranoid.  He also has a vague feeling of being unwell.  He denies any tremor vomiting, chest pain shortness breath nausea headache stiff neck focal numbness or weakness or other physical complaints.  Is been feeling unwell for "a couple days".  Dates years ago he was given a "floxacin" medicine and since that time he has never been right.  He denies SI or homicidal ideation and he denies taking overdose   Past Medical History:  Diagnosis Date  . Back pain 2014  . Bone spur   . Bulging disc 2014  . Degenerative disc disease   . Osteoarthritis   . Panic anxiety syndrome   . Taking multiple medications for chronic disease     Patient Active Problem List   Diagnosis Date Noted  . Closed compression fracture of L1 lumbar vertebra (HCC) 05/24/2016  . Lumbar stenosis with neurogenic claudication 11/01/2015  . Chronic 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 11/08/2014  . Hx of hemorrhoids 11/08/2014  . AAA (abdominal aortic aneurysm) (HCC) 09/22/2014  . Chronic pain associated with significant psychosocial dysfunction 09/22/2014  . Panic attack 09/22/2014  . AB (asthmatic bronchitis) 08/17/2014  . Anxiety disorder due  to general medical condition 08/17/2014  . Backache 08/17/2014  . Lumbosacral spondylosis without myelopathy 08/17/2014  . 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 Brash 08/17/2014  . Low back pain 08/17/2014  . Tendon nodule 08/17/2014  . Episodic paroxysmal anxiety disorder 08/17/2014  . Hernia, inguinal, right 08/17/2014  . Fast heart beat 08/17/2014  . Illness 08/17/2014  . Inguinal hernia 10/14/2012    Past Surgical History:  Procedure Laterality Date  . HERNIA REPAIR Left 2006,2014   [facility]  . TOE SURGERY Right 2007    Prior to Admission medications   Medication Sig Start Date End Date Taking? Authorizing Provider  Calcium Carb-Cholecalciferol (CALCIUM+D3) 600-800 MG-UNIT TABS Take by mouth.    [provider]  celecoxib (CELEBREX) 200 MG capsule Take 1 capsule by mouth  twice daily as needed 12/24/16   Chrismon, Jodell Ciproennis E, PA  Cholecalciferol (VITAMIN D3) 2000 units TABS Take by mouth.    [provider]  clonazePAM (KLONOPIN) 1 MG tablet Take 2 tablets in the morning, 1 afternoon and 1 at bedtime by mouth as needed for anxiety/panic. 03/21/17   Chrismon, Jodell Ciproennis E, PA  Glucosamine-Chondroit-Vit C-Mn (GLUCOSAMINE CHONDR 1500 COMPLX PO) Take by mouth.    [provider]  HYDROcodone-acetaminophen (NORCO) 7.5-325 MG tablet Take 1 tablet by mouth 3 (three) times daily as needed. 05/01/17   Chrismon, Jodell Ciproennis E, PA  Multiple Vitamin (MULTIVITAMIN) tablet Take 1 tablet by mouth daily.    [provider]  Probiotic Product (PROBIOTIC COLON SUPPORT PO) Take by mouth.  [provider]  tiZANidine (ZANAFLEX) 4 MG tablet TAKE 1 TABLET BY MOUTH 3  TIMES DAILY 01/27/17   Chrismon, Jodell Cipro, PA    Allergies Amitriptyline; Buspirone; Ciprofloxacin; Doxepin; Duloxetine hcl; Escitalopram; Penicillins; Sertraline; and Trazodone  Family History  Problem Relation Age of Onset  . Diabetes Mother   . Diabetes Maternal Uncle   . Diabetes Maternal  Grandmother   . Heart disease Maternal Grandmother     Social History Social History   Tobacco Use  . Smoking status: Never Smoker  . Smokeless tobacco: Never Used  Substance Use Topics  . Alcohol use: No  . Drug use: No    Review of Systems Constitutional: No fever/chills Eyes: No visual changes. ENT: No sore throat. No stiff neck no neck pain Cardiovascular: Denies chest pain. Respiratory: Denies shortness of breath. Gastrointestinal:   no vomiting.  No diarrhea.  No constipation. Genitourinary: Negative for dysuria. Musculoskeletal: Negative lower extremity swelling Skin: Negative for rash. Neurological: Negative for severe headaches, focal weakness or numbness.   ____________________________________________   PHYSICAL EXAM:  VITAL SIGNS: ED Triage Vitals  Enc Vitals Group     BP 05/08/17 1824 124/84     Pulse Rate 05/08/17 1824 68     Resp 05/08/17 1824 20     Temp 05/08/17 1824 98.6 F (37 C)     Temp Source 05/08/17 1824 Oral     SpO2 05/08/17 1824 98 %     Weight 05/08/17 1821 194 lb (88 kg)     Height 05/08/17 1821 6\' 1"  (1.854 m)     Head Circumference --      Peak Flow --      Pain Score --      Pain Loc --      Pain Edu? --      Excl. in GC? --     Constitutional: Alert and oriented. Well appearing and in no acute distress. Eyes: Conjunctivae are normal Head: Atraumatic HEENT: No congestion/rhinnorhea. Mucous membranes are moist.  Oropharynx non-erythematous Neck:   Nontender with no meningismus, no masses, no stridor Cardiovascular: Normal rate, regular rhythm. Grossly normal heart sounds.  Good peripheral circulation. Respiratory: Normal respiratory effort.  No retractions. Lungs CTAB. Abdominal: Soft and nontender. No distention. No guarding no rebound Back:  There is no focal tenderness or step off.  there is no midline tenderness there are no lesions noted. there is no CVA tenderness Musculoskeletal: No lower extremity tenderness, no upper  extremity tenderness. No joint effusions, no DVT signs strong distal pulses no edema Neurologic:  Normal speech and language. No gross focal neurologic deficits are appreciated.  Skin:  Skin is warm, dry and intact. No rash noted. Psychiatric: Mood and affect are bizarre. Speech and behavior are normal.  ____________________________________________   LABS (all labs ordered are listed, but only abnormal results are displayed)  Labs Reviewed  COMPREHENSIVE METABOLIC PANEL - Abnormal; Notable for the following components:      Result Value   Potassium 3.3 (*)    Glucose, Bld 129 (*)    Total Protein 8.3 (*)    Albumin 5.3 (*)    All other components within normal limits  ACETAMINOPHEN LEVEL - Abnormal; Notable for the following components:   Acetaminophen (Tylenol), Serum <10 (*)    All other components within normal limits  URINE DRUG SCREEN, QUALITATIVE ([facility] ONLY) - Abnormal; Notable for the following components:   Opiate, Ur Screen POSITIVE (*)    Cannabinoid 50 Ng, Ur [place]  POSITIVE (*)    Benzodiazepine, Ur Scrn POSITIVE (*)    All other components within normal limits  ETHANOL  SALICYLATE LEVEL  CBC    Pertinent labs  results that were available during my care of the patient were reviewed by me and considered in my medical decision making (see chart for details). ____________________________________________  EKG  I personally interpreted any EKGs ordered by me or triage  ____________________________________________  RADIOLOGY  Pertinent labs & imaging results that were available during my care of the patient were reviewed by me and considered in my medical decision making (see chart for details). If possible, patient and/or family made aware of any abnormal findings.  No results found. ____________________________________________    PROCEDURES  Procedure(s) performed: None  Procedures  Critical Care performed:  None  ____________________________________________   INITIAL IMPRESSION / ASSESSMENT AND PLAN / ED COURSE  Pertinent labs & imaging results that were available during my care of the patient were reviewed by me and considered in my medical decision making (see chart for details).  Here with paranoid and bizarre behavior, he is under IVC.  We will start him on [place] protocol for possible benzo withdrawal although clinically he is not in withdrawal at this time.  He has been on benzos for some time and is only had one today so far.  Patient is in no acute medical distress at this time.  We will have him evaluated by psychiatry for some evidence of delusional and paranoid thoughts.  No obvious toxidrome or physical pathology noted.    ____________________________________________   FINAL CLINICAL IMPRESSION(S) / ED DIAGNOSES  Final diagnoses:  None      This chart was dictated using voice recognition software.  Despite best efforts to proofread,  errors can occur which can change meaning.      Jeanmarie Plant, MD 05/08/17 1924    Jeanmarie Plant, MD 05/08/17 [phone]

## 2017-05-08 NOTE — ED Notes (Signed)
Hourly rounding reveals patient sleeping in room. No complaints, stable, in no acute distress. Q15 minute rounds and monitoring via Security Cameras to continue. 

## 2017-05-08 NOTE — ED Notes (Signed)
Patient and BPD placed in triage holding area for better visualization from triage RN's.

## 2017-05-08 NOTE — ED Notes (Signed)
Pt given mouth moisturizer to use as Chapstick.

## 2017-05-08 NOTE — ED Notes (Signed)
Pt provided chap stick by this tech

## 2017-05-08 NOTE — ED Triage Notes (Signed)
Pt brought in by pbd from [facility].  Pt is ivc.  Pt states my meds have been compromised.  Pt denies si or hi.  Pt denies etoh.  Used marijuana yesterday.  Pt calm and cooperative in triage.

## 2017-05-09 ENCOUNTER — Inpatient Hospital Stay
Admission: AD | Admit: 2017-05-09 | Discharge: 2017-05-23 | DRG: 885 | Disposition: A | Discharge status: Home / Self Care | Payer: Managed Care, Other (non HMO) | Attending: Psychiatry | Admitting: Psychiatry

## 2017-05-09 DIAGNOSIS — Y92230 Patient room in hospital as the place of occurrence of the external cause: Secondary | ICD-10-CM | POA: Diagnosis not present

## 2017-05-09 DIAGNOSIS — F1220 Cannabis dependence, uncomplicated: Secondary | ICD-10-CM

## 2017-05-09 DIAGNOSIS — R4182 Altered mental status, unspecified: Secondary | ICD-10-CM | POA: Diagnosis not present

## 2017-05-09 DIAGNOSIS — T450X5A Adverse effect of antiallergic and antiemetic drugs, initial encounter: Secondary | ICD-10-CM | POA: Diagnosis not present

## 2017-05-09 DIAGNOSIS — F29 Unspecified psychosis not due to a substance or known physiological condition: Secondary | ICD-10-CM | POA: Diagnosis not present

## 2017-05-09 DIAGNOSIS — Z880 Allergy status to penicillin: Secondary | ICD-10-CM

## 2017-05-09 DIAGNOSIS — Z881 Allergy status to other antibiotic agents status: Secondary | ICD-10-CM | POA: Diagnosis not present

## 2017-05-09 DIAGNOSIS — Z888 Allergy status to other drugs, medicaments and biological substances status: Secondary | ICD-10-CM

## 2017-05-09 DIAGNOSIS — W25XXXA Contact with sharp glass, initial encounter: Secondary | ICD-10-CM | POA: Diagnosis present

## 2017-05-09 DIAGNOSIS — Z765 Malingerer [conscious simulation]: Secondary | ICD-10-CM | POA: Diagnosis not present

## 2017-05-09 DIAGNOSIS — F23 Brief psychotic disorder: Secondary | ICD-10-CM

## 2017-05-09 DIAGNOSIS — I4589 Other specified conduction disorders: Secondary | ICD-10-CM | POA: Diagnosis present

## 2017-05-09 DIAGNOSIS — R451 Restlessness and agitation: Secondary | ICD-10-CM | POA: Diagnosis not present

## 2017-05-09 DIAGNOSIS — M8580 Other specified disorders of bone density and structure, unspecified site: Secondary | ICD-10-CM | POA: Diagnosis present

## 2017-05-09 DIAGNOSIS — F209 Schizophrenia, unspecified: Secondary | ICD-10-CM | POA: Diagnosis not present

## 2017-05-09 DIAGNOSIS — M795 Residual foreign body in soft tissue: Secondary | ICD-10-CM

## 2017-05-09 DIAGNOSIS — M1990 Unspecified osteoarthritis, unspecified site: Secondary | ICD-10-CM | POA: Diagnosis present

## 2017-05-09 DIAGNOSIS — S61411A Laceration without foreign body of right hand, initial encounter: Secondary | ICD-10-CM | POA: Diagnosis not present

## 2017-05-09 DIAGNOSIS — F22 Delusional disorders: Secondary | ICD-10-CM | POA: Diagnosis not present

## 2017-05-09 DIAGNOSIS — Z79899 Other long term (current) drug therapy: Secondary | ICD-10-CM

## 2017-05-09 DIAGNOSIS — M545 Low back pain: Secondary | ICD-10-CM | POA: Diagnosis present

## 2017-05-09 DIAGNOSIS — F329 Major depressive disorder, single episode, unspecified: Secondary | ICD-10-CM | POA: Diagnosis present

## 2017-05-09 DIAGNOSIS — F410 Panic disorder [episodic paroxysmal anxiety] without agoraphobia: Secondary | ICD-10-CM | POA: Diagnosis present

## 2017-05-09 DIAGNOSIS — Z5181 Encounter for therapeutic drug level monitoring: Secondary | ICD-10-CM | POA: Diagnosis not present

## 2017-05-09 DIAGNOSIS — R000 Tachycardia, unspecified: Secondary | ICD-10-CM | POA: Diagnosis not present

## 2017-05-09 DIAGNOSIS — R609 Edema, unspecified: Secondary | ICD-10-CM

## 2017-05-09 DIAGNOSIS — Z915 Personal history of self-harm: Secondary | ICD-10-CM | POA: Diagnosis not present

## 2017-05-09 DIAGNOSIS — S61011A Laceration without foreign body of right thumb without damage to nail, initial encounter: Secondary | ICD-10-CM | POA: Diagnosis present

## 2017-05-09 DIAGNOSIS — Z791 Long term (current) use of non-steroidal anti-inflammatories (NSAID): Secondary | ICD-10-CM

## 2017-05-09 DIAGNOSIS — F200 Paranoid schizophrenia: Secondary | ICD-10-CM | POA: Diagnosis not present

## 2017-05-09 DIAGNOSIS — W228XXA Striking against or struck by other objects, initial encounter: Secondary | ICD-10-CM | POA: Diagnosis not present

## 2017-05-09 DIAGNOSIS — S6991XA Unspecified injury of right wrist, hand and finger(s), initial encounter: Secondary | ICD-10-CM | POA: Diagnosis not present

## 2017-05-09 DIAGNOSIS — G894 Chronic pain syndrome: Secondary | ICD-10-CM | POA: Diagnosis present

## 2017-05-09 DIAGNOSIS — E559 Vitamin D deficiency, unspecified: Secondary | ICD-10-CM | POA: Diagnosis present

## 2017-05-09 DIAGNOSIS — M479 Spondylosis, unspecified: Secondary | ICD-10-CM | POA: Diagnosis present

## 2017-05-09 DIAGNOSIS — G4700 Insomnia, unspecified: Secondary | ICD-10-CM | POA: Diagnosis present

## 2017-05-09 DIAGNOSIS — S99921A Unspecified injury of right foot, initial encounter: Secondary | ICD-10-CM | POA: Diagnosis not present

## 2017-05-09 MED ORDER — LORAZEPAM 2 MG/ML IJ SOLN
0.0000 mg | Freq: Four times a day (QID) | INTRAMUSCULAR | Status: DC
  Administered 2017-05-10: 2 mg via INTRAVENOUS

## 2017-05-09 MED ORDER — LORAZEPAM 2 MG/ML IJ SOLN: 0.0000 mg | Freq: Two times a day (BID) | INTRAMUSCULAR | Status: DC

## 2017-05-09 MED ORDER — ALUM & MAG HYDROXIDE-SIMETH 200-200-20 MG/5ML PO SUSP
30.0000 mL | ORAL | Status: DC | PRN
  Administered 2017-05-11 – 2017-05-16 (×4): 30 mL via ORAL
  Filled 2017-05-09 (×4): qty 30

## 2017-05-09 MED ORDER — HYDROCODONE-ACETAMINOPHEN 7.5-325 MG PO TABS
1.0000 | ORAL_TABLET | Freq: Four times a day (QID) | ORAL | Status: DC | PRN
  Administered 2017-05-10 – 2017-05-23 (×18): 1 via ORAL
  Filled 2017-05-09 (×24): qty 1

## 2017-05-09 MED ORDER — THIAMINE HCL 100 MG/ML IJ SOLN
100.0000 mg | Freq: Every day | INTRAMUSCULAR | Status: DC
  Filled 2017-05-09 (×3): qty 1

## 2017-05-09 MED ORDER — MAGNESIUM HYDROXIDE 400 MG/5ML PO SUSP
30.0000 mL | Freq: Every day | ORAL | Status: DC | PRN
  Administered 2017-05-12 – 2017-05-16 (×6): 30 mL via ORAL
  Filled 2017-05-09 (×6): qty 30

## 2017-05-09 MED ORDER — LORAZEPAM 2 MG PO TABS
0.0000 mg | ORAL_TABLET | Freq: Two times a day (BID) | ORAL | Status: DC
  Administered 2017-05-11: 2 mg via ORAL
  Filled 2017-05-09: qty 2

## 2017-05-09 MED ORDER — VITAMIN B-1 100 MG PO TABS
100.0000 mg | ORAL_TABLET | Freq: Every day | ORAL | Status: DC
  Administered 2017-05-10 – 2017-05-12 (×2): 100 mg via ORAL
  Filled 2017-05-09 (×2): qty 1

## 2017-05-09 MED ORDER — HYDROXYZINE HCL 50 MG PO TABS
50.0000 mg | ORAL_TABLET | Freq: Three times a day (TID) | ORAL | Status: DC | PRN
  Administered 2017-05-10 – 2017-05-23 (×16): 50 mg via ORAL
  Filled 2017-05-09 (×18): qty 1

## 2017-05-09 MED ORDER — LORAZEPAM 2 MG PO TABS
0.0000 mg | ORAL_TABLET | Freq: Four times a day (QID) | ORAL | Status: DC
  Administered 2017-05-10: 2 mg via ORAL
  Filled 2017-05-09 (×2): qty 1

## 2017-05-09 MED ORDER — CLONAZEPAM 1 MG PO TABS
1.0000 mg | ORAL_TABLET | Freq: Three times a day (TID) | ORAL | Status: DC
  Administered 2017-05-10 – 2017-05-11 (×4): 1 mg via ORAL
  Filled 2017-05-09 (×4): qty 1

## 2017-05-09 MED ORDER — ACETAMINOPHEN 325 MG PO TABS
650.0000 mg | ORAL_TABLET | Freq: Four times a day (QID) | ORAL | Status: DC | PRN
  Administered 2017-05-12 – 2017-05-18 (×4): 650 mg via ORAL
  Filled 2017-05-09 (×6): qty 2

## 2017-05-09 MED ORDER — MAGNESIUM HYDROXIDE 400 MG/5ML PO SUSP
30.0000 mL | Freq: Once | ORAL | Status: AC
  Administered 2017-05-09: 30 mL via ORAL
  Filled 2017-05-09: qty 30

## 2017-05-09 MED ORDER — HYDROCODONE-ACETAMINOPHEN 7.5-325 MG PO TABS
1.0000 | ORAL_TABLET | Freq: Four times a day (QID) | ORAL | Status: DC | PRN
  Administered 2017-05-09: 1 via ORAL
  Filled 2017-05-09: qty 1

## 2017-05-09 NOTE — ED Notes (Signed)
Patient received PM snack. 

## 2017-05-09 NOTE — ED Notes (Signed)
Hourly rounding reveals patient in room. C/O anxiety and paranoia. Q15 minute rounds and monitoring via [employer] to continue.

## 2017-05-09 NOTE — ED Notes (Signed)
Hourly rounding reveals patient in room. No complaints, stable, in no acute distress. Q15 minute rounds and monitoring via Security Cameras to continue. 

## 2017-05-09 NOTE — ED Notes (Signed)
Hourly rounding reveals patient sleeping in room. No complaints, stable, in no acute distress. Q15 minute rounds and monitoring via Security Cameras to continue. 

## 2017-05-09 NOTE — ED Notes (Signed)
Dr. Toni Amendlapacs talking with Patient. Patient is calm and cooperative.

## 2017-05-09 NOTE — ED Notes (Addendum)
Hourly rounding reveals patient in room less anxious. No complaints, stable, in no acute distress. Q15 minute rounds and monitoring via [employer] to continue.

## 2017-05-09 NOTE — ED Notes (Signed)
Patient ate 100% of lunch with beverage.  

## 2017-05-09 NOTE — ED Provider Notes (Signed)
-----------------------------------------   6:09 AM on 05/09/2017 -----------------------------------------   Blood pressure 126/75, pulse 62, temperature 98.2 F (36.8 C), temperature source Oral, resp. rate 18, height 1.854 m (6\' 1" ), weight 88 kg (194 lb), SpO2 98 %.  The patient had no acute events since last update.  Calm and cooperative at this time.  Disposition is pending Psychiatry/Behavioral Medicine team recommendations.     Loleta RoseForbach, Wai Minotti, MD 05/09/17 [phone]

## 2017-05-09 NOTE — ED Notes (Signed)
Report was received from Fredia BeetsWendy N., RN; Pt. Verbalizes  complaints of having Psychosis; with paranoia; feels that others want to hurt him; thinks that his wife and daughter want to poison him; also c/o chronic back pain; and  Distress of people want to break into his home; denies S.I./Hi. Continue to monitor with 15 min. Monitoring.

## 2017-05-09 NOTE — ED Notes (Signed)
Patient walked back in area where room 8 is at and when nurse and security guard let him know that he needed to go back to his room and that area was off limits due to male occupying space there, He said" I did not do what they said I did, and I need a lawyer, you are trying to trick me and hold me against my will, Patient appeared confused, He went back to room and then told nurse, " oh yea I remember you now' nurse gave him remote and phone to use, and He calmed back to baseline, will continue to monitor, q 15 minute checks and camera surveillance in progress for safety.

## 2017-05-09 NOTE — BH Assessment (Signed)
[facility] by Dr. Toni Amendlapacs.  Attending Physician will be Dr. Johnella MoloneyMcNew.   [facility] MegargelPhyllis.   Intake Paper Work has been signed and placed on patient chart.  ER staff is aware of the admission Misty Stanley( Lisa, ER Sect.; Dr. Letta KocherPudowski, ER MD; Toniann FailWendy Patient's Nurse & Mertie ClauseJeanelle Patient Access).

## 2017-05-09 NOTE — ED Notes (Addendum)
Patient put mattress on floor and is laying on floor, states it helps his back, nurse helped him turn tv on again, Patient with thought blocking in intervals, did state that he has a chip in his rectum and a remote control and needs a laxative to get it out,  odd behavior but safe, and will cooperate. Nurse to continue to monitor.

## 2017-05-09 NOTE — ED Provider Notes (Signed)
-----------------------------------------   2:24 PM on 05/09/2017 -----------------------------------------  Patient has been seen by psychiatry they will be admitting to their service for further treatment.   Priscila Bean, MD 05/09/17 1424  

## 2017-05-09 NOTE — Consult Note (Signed)
[facility] Consult   Reason for Consult: Consult for 47 year old man brought to the emergency room last night under IVC Referring Physician: Paduchowski Patient Identification: David Reeves MRN:  [id number] Principal Diagnosis: Psychosis [facility]) Diagnosis:   Patient Active Problem List   Diagnosis Date Noted  . Psychosis ([place]) [F29] 05/09/2017    Priority: High  . Cannabis abuse [F12.10] 05/09/2017    Priority: High  . Chronic 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 [M54.41, G89.29] 11/08/2014    Priority: Medium  . Chronic pain associated with significant psychosocial dysfunction [G89.4] 09/22/2014    Priority: Medium  . Panic attack [F41.0] 09/22/2014    Priority: Medium  . Closed compression fracture of L1 lumbar vertebra ([place]) [S32.010A] 05/24/2016  . Lumbar stenosis with neurogenic claudication [M48.062] 11/01/2015  . Hx of hemorrhoids [Z87.19] 11/08/2014  . AAA (abdominal aortic aneurysm) ([place]) [I71.4] 09/22/2014  . AB (asthmatic bronchitis) [J45.909] 08/17/2014  . 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 Backache [M54.9] 08/17/2014  . Lumbosacral spondylosis without myelopathy [M47.817] 08/17/2014  . Disorder of male genital organs [N50.9] 08/17/2014  . Brash [R12] 08/17/2014  . Low back pain [M54.5] 08/17/2014  . Tendon nodule [M67.90] 08/17/2014  . 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 Hernia, inguinal, right [K40.90] 08/17/2014  . Fast heart beat [R00.0] 08/17/2014  . Illness [R69] 08/17/2014  . Inguinal hernia [K40.90] 10/14/2012    Total Time spent with patient: 1 hour  Subjective:   David Reeves is a 47 y.o. male patient admitted with "just read my record".  HPI: Patient interviewed chart reviewed.  This is a 47 year old man who was brought here from [facility] last night under IVC.  The exact circumstances of the IVC are still a little unclear to me.  Patient tells me that his  wife initiated it but has no theory as to why this happened.  I tried to reach his wife on the telephone and no one was answering.  Evidently the patient has developed paranoia and delusions that his daughter has been raped.  He repeated that believed to me but could not give me any evidence or rationale for it.  When he came in last night he was reported to be very confused and there is documentation that he made at least 1 bizarre statement about having some kind of a device in his rectum.  In interview with me the patient presents as paranoid very defensive unwilling to give much information.  He repeats multiple times the fact that he has chronic back pain.  Patient admits that he uses marijuana regularly.  Denies that he has been overusing or misusing his prescription medicine.  Denies any other drug use.  He denies any thoughts about harming himself or harming anyone else.  He does tell me that he has not been sleeping well recently and it is specifically noted in the chart that he had said he had not slept for a couple of days.  Social history: Patient lives with his wife and teenage daughter.  He is disabled for back pain and does not work.  Medical history: Chronic back pain.  Patient told me about 4 or 5 different reasons why he has chronic back pain.  The exact etiology of it is still a little unclear to me.  He is maintained evidently on 7.5 mg Norco 4 of them a day and has been on that for a long time.  Substance abuse history: Patient says  he drinks rarely and not abusively.  Admits that he uses marijuana every day.  Denies other drug abuse.  Denies misuse of his prescription medicine.  Past Psychiatric History: He has a diagnosis of panic attacks or panic disorder through his primary care doctor.  He is prescribed clonazepam 1 mg 4 of them a day and has been on that for quite a while.  It is documented in the chart that he is intolerant of multiple other medications that could be used to treat  panic.  He denies ever being in a psychiatric hospital before.  He admits that he has had one suicide attempt says it was it was many years ago and will not share the details with me.  I could not find any other specific psychiatric notes in the computer records.  Risk to Self: Suicidal Ideation: No Suicidal Intent: No Is patient at risk for suicide?: No Suicidal Plan?: No Access to Means: No What has been your use of drugs/alcohol within the last 12 months?: n How many times?: 1 Other Self Harm Risks: Possible Drug use  Triggers for Past Attempts: None known Intentional Self Injurious Behavior: None Risk to Others: Homicidal Ideation: No Thoughts of Harm to Others: No Current Homicidal Intent: No Current Homicidal Plan: No Access to Homicidal Means: No Identified Victim: n/a History of harm to others?: No Assessment of Violence: None Noted Violent Behavior Description: none noted  Does patient have access to weapons?: No Criminal Charges Pending?: No Does patient have a court date: No Prior Inpatient Therapy: Prior Inpatient Therapy: No(Pt denied ) Prior Therapy Dates: UTA Prior Therapy Facilty/Provider(s): UTA  Reason for Treatment: UTA  Prior Outpatient Therapy: Prior Outpatient Therapy: No Prior Therapy Dates: n/a Prior Therapy Facilty/Provider(s): n/a Reason for Treatment: n/a Does patient have an ACCT team?: No Does patient have Intensive In-House Services?  : No Does patient have [facility] services? : No Does patient have P4CC services?: No  Past Medical History:  Past Medical History:  Diagnosis Date  . Back pain 2014  . Bone spur   . Bulging disc 2014  . Degenerative disc disease   . Osteoarthritis   . Panic anxiety syndrome   . Taking multiple medications for chronic disease     Past Surgical History:  Procedure Laterality Date  . HERNIA REPAIR Left 2006,2014   [facility]  . TOE SURGERY Right 2007   Family History:  Family History  Problem Relation Age of  Onset  . Diabetes Mother   . Diabetes Maternal Uncle   . Diabetes Maternal Grandmother   . Heart disease Maternal Grandmother    Family Psychiatric  History: He denies any family history Social History:  Social History   Substance and Sexual Activity  Alcohol Use No     Social History   Substance and Sexual Activity  Drug Use No    Social History   Socioeconomic History  . Marital status: Married    Spouse name: Not on file  . Number of children: Not on file  . Years of education: Not on file  . Highest education level: Not on file  Social Needs  . Financial resource strain: Not on file  . Food insecurity - worry: Not on file  . Food insecurity - inability: Not on file  . Transportation needs - medical: Not on file  . Transportation needs - non-medical: Not on file  Occupational History  . Not on file  Tobacco Use  . Smoking status: Never Smoker  .  Smokeless tobacco: Never Used  Substance and Sexual Activity  . Alcohol use: No  . Drug use: No  . Sexual activity: Not on file  Other Topics Concern  . Not on file  Social History Narrative  . Not on file   Additional Social History:    Allergies:   Allergies  Allergen Reactions  . Amitriptyline Other (See Comments)    Urine retention  . Buspirone Other (See Comments)    Kidney pain  . Ciprofloxacin Other (See Comments)    hallucinations  . Doxepin Other (See Comments)    "Didn't feel right"  . Duloxetine Hcl Other (See Comments)    Sweating  . Escitalopram Other (See Comments)    Agitation  . Penicillins Other (See Comments)    Unknown -- childhood reaction  . Sertraline Other (See Comments)    Urine retention  . Trazodone Other (See Comments)    abd pain, urine retention    Labs:  Results for orders placed or performed during the hospital encounter of 05/08/17 (from the past 48 hour(s))  Comprehensive metabolic panel     Status: Abnormal   Collection Time: 05/08/17  6:23 PM  Result Value Ref  Range   Sodium 138 135 - 145 mmol/L   Potassium 3.3 (L) 3.5 - 5.1 mmol/L   Chloride 103 101 - 111 mmol/L   CO2 24 22 - 32 mmol/L   Glucose, Bld 129 (H) 65 - 99 mg/dL   BUN 7 6 - 20 mg/dL   Creatinine, Ser 0.75 0.61 - 1.24 mg/dL   Calcium 9.4 8.9 - 10.3 mg/dL   Total Protein 8.3 (H) 6.5 - 8.1 g/dL   Albumin 5.3 (H) 3.5 - 5.0 g/dL   AST 34 15 - 41 U/L   ALT 26 17 - 63 U/L   Alkaline Phosphatase 48 38 - 126 U/L   Total Bilirubin 1.1 0.3 - 1.2 mg/dL   GFR calc non Af Amer >60 >60 mL/min   GFR calc Af Amer >60 >60 mL/min    Comment: (NOTE) The eGFR has been calculated using the CKD EPI equation. This calculation has not been validated in all clinical situations. eGFR's persistently <60 mL/min signify possible Chronic Kidney Disease.    Anion gap 11 5 - 15    Comment: Performed at [facility], [address] [place], [place] [address]  Ethanol     Status: None   Collection Time: 05/08/17  6:23 PM  Result Value Ref Range   Alcohol, Ethyl (B) <10 <10 mg/dL    Comment:        LOWEST DETECTABLE LIMIT FOR SERUM ALCOHOL IS 10 mg/dL FOR MEDICAL PURPOSES ONLY Performed at [facility], [address] [place], [place] [address]   Salicylate level     Status: None   Collection Time: 05/08/17  6:23 PM  Result Value Ref Range   Salicylate Lvl <8.6 2.8 - 30.0 mg/dL    Comment: Performed at [facility], [address] [place], [place] [address]  Acetaminophen level     Status: Abnormal   Collection Time: 05/08/17  6:23 PM  Result Value Ref Range   Acetaminophen (Tylenol), Serum <10 (L) 10 - 30 ug/mL    Comment:        THERAPEUTIC CONCENTRATIONS VARY SIGNIFICANTLY. A RANGE OF 10-30 ug/mL MAY BE AN EFFECTIVE CONCENTRATION FOR MANY PATIENTS. HOWEVER, SOME ARE BEST TREATED AT CONCENTRATIONS OUTSIDE THIS RANGE. ACETAMINOPHEN CONCENTRATIONS >150 ug/mL AT 4 HOURS AFTER INGESTION AND >  50 ug/mL AT 12 HOURS AFTER INGESTION ARE OFTEN ASSOCIATED WITH  TOXIC REACTIONS. Performed at [facility], [address] [place], [place] [address]   cbc     Status: None   Collection Time: 05/08/17  6:23 PM  Result Value Ref Range   WBC 7.7 3.8 - 10.6 K/uL   RBC 4.99 4.40 - 5.90 MIL/uL   Hemoglobin 15.9 13.0 - 18.0 g/dL   HCT 46.8 40.0 - 52.0 %   MCV 93.9 80.0 - 100.0 fL   MCH 32.0 26.0 - 34.0 pg   MCHC 34.0 32.0 - 36.0 g/dL   RDW 12.9 11.5 - 14.5 %   Platelets 295 150 - 440 K/uL    Comment: Performed at [facility], [address] [place], [place] [address]  Urine Drug Screen, Qualitative     Status: Abnormal   Collection Time: 05/08/17  6:23 PM  Result Value Ref Range   Tricyclic, Ur Screen NONE DETECTED NONE DETECTED   Amphetamines, Ur Screen NONE DETECTED NONE DETECTED   MDMA (Ecstasy)Ur Screen NONE DETECTED NONE DETECTED   Cocaine Metabolite,Ur [place] NONE DETECTED NONE DETECTED   Opiate, Ur Screen POSITIVE (A) NONE DETECTED   Phencyclidine (PCP) Ur S NONE DETECTED NONE DETECTED   Cannabinoid 50 Ng, Ur [place] POSITIVE (A) NONE DETECTED   Barbiturates, Ur Screen NONE DETECTED NONE DETECTED   Benzodiazepine, Ur Scrn POSITIVE (A) NONE DETECTED   Methadone Scn, Ur NONE DETECTED NONE DETECTED    Comment: (NOTE) Tricyclics + metabolites, urine    Cutoff 1000 ng/mL Amphetamines + metabolites, urine  Cutoff 1000 ng/mL MDMA (Ecstasy), urine              Cutoff 500 ng/mL Cocaine Metabolite, urine          Cut[place] 300 ng/mL Opiate + metabolites, urine        Cut[place] 300 ng/mL Phencyclidine (PCP), urine         Cutoff 25 ng/mL Cannabinoid, urine                 Cutoff 50 ng/mL Barbiturates + metabolites, urine  Cutoff 200 ng/mL Benzodiazepine, urine              Cutoff 200 ng/mL Methadone, urine                   Cutoff 300 ng/mL The urine drug screen provides only a preliminary, unconfirmed analytical test result and should not be used for non-medical purposes. Clinical consideration and professional judgment should be  applied to any positive drug screen result due to possible interfering substances. A more specific alternate chemical method must be used in order to obtain a confirmed analytical result. Gas chromatography / mass spectrometry (GC/MS) is the preferred confirmat ory method. Performed at [facility], [address] [place], [place] [address]     Current Facility-Administered Medications  Medication Dose Route Frequency Provider Last Rate Last Dose  . clonazePAM (KLONOPIN) tablet 1 mg  1 mg Oral TID Schuyler Amor, MD   1 mg at 05/09/17 0947  . HYDROcodone-acetaminophen (NORCO) 7.5-325 MG per tablet 1 tablet  1 tablet Oral Q6H PRN Clapacs, [name], MD      . LORazepam (ATIVAN) injection 0-4 mg  0-4 mg Intravenous Q6H Schuyler Amor, MD       Or  . LORazepam (ATIVAN) tablet 0-4 mg  0-4 mg Oral Q6H Schuyler Amor, MD   2 mg at 05/09/17 1301  . [  START ON 05/11/2017] LORazepam (ATIVAN) injection 0-4 mg  0-4 mg Intravenous Q12H Schuyler Amor, MD       Or  . Derrill Memo ON 05/11/2017] LORazepam (ATIVAN) tablet 0-4 mg  0-4 mg Oral Q12H Schuyler Amor, MD   2 mg at 05/09/17 0149  . thiamine (VITAMIN B-1) tablet 100 mg  100 mg Oral Daily Schuyler Amor, MD   100 mg at 05/09/17 3748   Or  . thiamine (B-1) injection 100 mg  100 mg Intravenous Daily Schuyler Amor, MD       Current Outpatient Medications  Medication Sig Dispense Refill  . Calcium Carb-Cholecalciferol (CALCIUM+D3) 600-800 MG-UNIT TABS Take by mouth.    . celecoxib (CELEBREX) 200 MG capsule Take 1 capsule by mouth  twice daily as needed 180 capsule 2  . Cholecalciferol (VITAMIN D3) 2000 units TABS Take by mouth.    . clonazePAM (KLONOPIN) 1 MG tablet Take 2 tablets in the morning, 1 afternoon and 1 at bedtime by mouth as needed for anxiety/panic. 100 tablet 3  . Glucosamine-Chondroit-Vit C-Mn (GLUCOSAMINE CHONDR 1500 COMPLX PO) Take by mouth.    Marland Kitchen HYDROcodone-acetaminophen (NORCO) 7.5-325 MG tablet Take 1 tablet by  mouth 3 (three) times daily as needed. 90 tablet 0  . Multiple Vitamin (MULTIVITAMIN) tablet Take 1 tablet by mouth daily.    . Probiotic Product (PROBIOTIC COLON SUPPORT PO) Take by mouth.    Marland Kitchen tiZANidine (ZANAFLEX) 4 MG tablet TAKE 1 TABLET BY MOUTH 3  TIMES DAILY 270 tablet 1    Musculoskeletal: Strength & Muscle Tone: within normal limits Gait & Station: normal Patient leans: N/A  Psychiatric Specialty Exam: Physical Exam  Nursing note and vitals reviewed. Constitutional: He appears well-developed and well-nourished.  HENT:  Head: Normocephalic and atraumatic.  Eyes: Conjunctivae are normal. Pupils are equal, round, and reactive to light.  Neck: Normal range of motion.  Cardiovascular: Regular rhythm and normal heart sounds.  Respiratory: Effort normal. No respiratory distress.  GI: Soft.  Musculoskeletal: Normal range of motion.  Neurological: He is alert.  Skin: Skin is warm and dry.  Psychiatric: His mood appears anxious. His affect is labile and inappropriate. His speech is tangential. He is agitated. He is not aggressive. Thought content is paranoid. Cognition and memory are impaired. He expresses impulsivity. He exhibits abnormal recent memory. He is inattentive.    Review of Systems  Constitutional: Negative.   HENT: Negative.   Eyes: Negative.   Respiratory: Negative.   Cardiovascular: Negative.   Gastrointestinal: Negative.   Musculoskeletal: Positive for back pain.  Skin: Negative.   Neurological: Negative.   Psychiatric/Behavioral: Positive for substance abuse. Negative for depression, hallucinations, memory loss and suicidal ideas. The patient is nervous/anxious and has insomnia.     Blood pressure (!) 158/96, pulse 62, temperature 98.2 F (36.8 C), temperature source Oral, resp. rate 18, height 6' 1"  (1.854 m), weight 88 kg (194 lb), SpO2 98 %.Body mass index is 25.6 kg/m.  General Appearance: Casual  Eye Contact:  Fair  Speech:  Pressured  Volume:   Decreased  Mood:  Anxious and Irritable  Affect:  Congruent and Constricted  Thought Process:  Disorganized  Orientation:  Full (Time, Place, and Person)  Thought Content:  Illogical and Paranoid Ideation  Suicidal Thoughts:  No  Homicidal Thoughts:  No  Memory:  Immediate;   Fair Recent;   Fair Remote;   Fair  Judgement:  Impaired  Insight:  Shallow  Psychomotor Activity:  Restlessness  Concentration:  Concentration: Poor  Recall:  [employer] of Knowledge:  Fair  Language:  Fair  Akathisia:  No  Handed:  Right  AIMS (if indicated):     Assets:  Communication Skills Housing Resilience Social Support  ADL's:  Intact  Cognition:  WNL  Sleep:        Treatment Plan Summary: Daily contact with patient to assess and evaluate symptoms and progress in treatment, Medication management and Plan 47 year old man who apparently has had a subacute onset of paranoia confusion and psychotic agitated behavior.  He is currently under IVC initiated outside the hospital.  Patient continues to appear to be paranoid.  Given past history I would guess that the most likely etiology would be substance related.  I checked the database and it does look like he has been on the Norco and clonazepam doses for quite a long time.  He will be continued on approximately the same doses of those medicines for now.  I tried to reach his wife by telephone but got no answer.  Patient will be admitted to the psychiatric ward for further evaluation and treatment.  Disposition: Recommend psychiatric Inpatient admission when medically cleared. Supportive therapy provided about ongoing stressors.  Alethia Berthold, MD 05/09/2017 3:09 PM

## 2017-05-09 NOTE — ED Notes (Signed)
[facility].

## 2017-05-09 NOTE — BH Assessment (Signed)
Assessment Note  David Reeves is an 47 y.o. male. Patient presents with primary complaint of "I'm scared my mind is racing I don't know why".  Patient is well oriented although percent as confused. Pt was difficult to understand much of the time and timelines were inconsistent. Pt unable to provide clear history. Patient states that he's here because he's worried about his daughter. Patients responses to questioning are inconsistent at best and somewhat  uncleared but alludes to possible drug use. Patient states "it's something with the seal on it." He admits to using this substance off and on for several years and states that he smokes it from a bowl or bong. He repeats often throughout the assessment "I need to be drug tested." Patient denies any alcohol use. The report said he currently live with his wife I have no current or previous psychiatric outpatient treatment. . Patient endorses experiencing paranoid thoughts. He states "I need to get this RF (Radio frequency device) out of my rectum."  He voices one previous suicide attempt butPt. denies any suicidal ideation, plan or intent. Pt. denies the presence of any auditory or visual hallucinations at this time. Patient denies any other medical complaints. Pt presenting with impaired insight, judgement and impulse control, further evaluation is recommended.     UDS (+) Opiate, THC and Benzodiazepines   Diagnosis: Opiate Use Disorder and Sedative Use Disorder   Past Medical History:  Past Medical History:  Diagnosis Date  . Back pain 2014  . Bone spur   . Bulging disc 2014  . Degenerative disc disease   . Osteoarthritis   . Panic anxiety syndrome   . Taking multiple medications for chronic disease     Past Surgical History:  Procedure Laterality Date  . HERNIA REPAIR Left 2006,2014   [facility]  . TOE SURGERY Right 2007    Family History:  Family History  Problem Relation Age of Onset  . Diabetes Mother   . Diabetes Maternal Uncle    . Diabetes Maternal Grandmother   . Heart disease Maternal Grandmother     Social History:  reports that  has never smoked. he has never used smokeless tobacco. He reports that he does not drink alcohol or use drugs.  Additional Social History:  Alcohol / Drug Use Pain Medications: SEE PTA  Prescriptions: SEE PTA  Over the Counter: SEE PTA  History of alcohol / drug use?: (UTA) Longest period of sobriety (when/how long): UTA  CIWA: CIWA-Ar BP: 126/75 Pulse Rate: 62 Nausea and Vomiting: no nausea and no vomiting Tactile Disturbances: none Tremor: not visible, but can be felt fingertip to fingertip Auditory Disturbances: not present Paroxysmal Sweats: no sweat visible Visual Disturbances: not present Anxiety: moderately anxious, or guarded, so anxiety is inferred Headache, Fullness in Head: none present Agitation: two Orientation and Clouding of Sensorium: cannot do serial additions or is uncertain about date CIWA-Ar Total: 8 COWS: Clinical Opiate Withdrawal Scale (COWS) Resting Pulse Rate: Pulse Rate 80 or below Sweating: No report of chills or flushing Restlessness: Able to sit still Pupil Size: Pupils possibly larger than normal for room light Bone or Joint Aches: Not present Runny Nose or Tearing: Not present GI Upset: No GI symptoms Tremor: No tremor Yawning: No yawning Anxiety or Irritability: Patient reports increasing irritability or anxiousness Gooseflesh Skin: Skin is smooth COWS Total Score: 2  Allergies:  Allergies  Allergen Reactions  . Amitriptyline Other (See Comments)    Urine retention  . Buspirone Other (See Comments)  Kidney pain  . Ciprofloxacin Other (See Comments)    hallucinations  . Doxepin Other (See Comments)    "Didn't feel right"  . Duloxetine Hcl Other (See Comments)    Sweating  . Escitalopram Other (See Comments)    Agitation  . Penicillins Other (See Comments)    Unknown -- childhood reaction  . Sertraline Other (See  Comments)    Urine retention  . Trazodone Other (See Comments)    abd pain, urine retention    Home Medications:  (Not in a hospital admission)  OB/GYN Status:  No LMP for male patient.  General Assessment Data [facility] Assessment: In system Is this a Tele or Face-to-Face Assessment?: Face-to-Face Is this an Initial Assessment or a Re-assessment for this encounter?: Initial Assessment Marital status: Married Living Arrangements: Spouse/significant other Can pt return to current living arrangement?: Yes Admission Status: Involuntary Is patient capable of signing voluntary admission?: No Referral Source: Other([facility])  Medical Screening Exam [facility] ONLY) Medical Exam completed: Yes  Crisis Care Plan Living Arrangements: Spouse/significant other Legal Guardian: Other:([place] ) Name of Psychiatrist: none Name of Therapist: none   Education Status Is patient currently in school?: No Current Grade: No Highest grade of school patient has completed: unknown  Name of school: n/a Contact person: n/a  Risk to self with the past 6 months Suicidal Ideation: No Has patient been a risk to self within the past 6 months prior to admission? : No Suicidal Intent: No Has patient had any suicidal intent within the past 6 months prior to admission? : No Is patient at risk for suicide?: No Suicidal Plan?: No Has patient had any suicidal plan within the past 6 months prior to admission? : No Access to Means: No What has been your use of drugs/alcohol within the last 12 months?: n Previous Attempts/Gestures: No How many times?: 1 Other Self Harm Risks: Possible Drug use  Triggers for Past Attempts: None known Intentional Self Injurious Behavior: None Family Suicide History: No Recent stressful life event(s): Conflict (Comment) Persecutory voices/beliefs?: No Depression: Yes Substance abuse history and/or treatment for substance abuse?: Yes Suicide  prevention information given to non-admitted patients: Not applicable  Risk to Others within the past 6 months Homicidal Ideation: No Does patient have any lifetime risk of violence toward others beyond the six months prior to admission? : No Thoughts of Harm to Others: No Current Homicidal Intent: No Current Homicidal Plan: No Access to Homicidal Means: No Identified Victim: n/a History of harm to others?: No Assessment of Violence: None Noted Violent Behavior Description: none noted  Does patient have access to weapons?: No Criminal Charges Pending?: No Does patient have a court date: No Is patient on probation?: No  Psychosis Hallucinations: None noted Delusions: Persecutory  Mental Status Report Appearance/Hygiene: In scrubs Eye Contact: Fair Motor Activity: Freedom of movement Speech: Slow, Pressured, Tangential Level of Consciousness: Alert Mood: Suspicious Affect: Flat Anxiety Level: Minimal Thought Processes: Tangential Judgement: Impaired Orientation: Person, Place, Time, Situation Obsessive Compulsive Thoughts/Behaviors: Minimal  Cognitive Functioning Concentration: Poor Memory: Recent Intact IQ: Average Insight: Poor Impulse Control: Fair Appetite: Fair Weight Loss: 0 Weight Gain: 0 Sleep: No Change Total Hours of Sleep: 5 Vegetative Symptoms: None  ADLScreening [facility] Assessment Services) Patient's cognitive ability adequate to safely complete daily activities?: Yes Patient able to express need for assistance with ADLs?: Yes Independently performs ADLs?: Yes (appropriate for developmental age)  Prior Inpatient Therapy Prior Inpatient Therapy: No(Pt denied ) Prior Therapy  Dates: UTA Prior Therapy Facilty/Provider(s): UTA  Reason for Treatment: UTA   Prior Outpatient Therapy Prior Outpatient Therapy: No Prior Therapy Dates: n/a Prior Therapy Facilty/Provider(s): n/a Reason for Treatment: n/a Does patient have an ACCT team?: No Does patient  have Intensive In-House Services?  : No Does patient have [facility] services? : No Does patient have P4CC services?: No  ADL Screening (condition at time of admission) Patient's cognitive ability adequate to safely complete daily activities?: Yes Patient able to express need for assistance with ADLs?: Yes Independently performs ADLs?: Yes (appropriate for developmental age)       Abuse/Neglect Assessment (Assessment to be complete while patient is alone) Abuse/Neglect Assessment Can Be Completed: Unable to assess, patient is non-responsive or altered mental status Values / Beliefs Cultural Requests During Hospitalization: None Spiritual Requests During Hospitalization: None Consults Spiritual Care Consult Needed: No Social Work Consult Needed: No Merchant navy officer (For Healthcare) Does Patient Have a Medical Advance Directive?: No    Additional Information 1:1 In Past 12 Months?: No CIRT Risk: No Elopement Risk: No Does patient have medical clearance?: Yes     Disposition:  Disposition Initial Assessment Completed for this Encounter: Yes Disposition of Patient: Other dispositions, Re-evaluation by Psychiatry recommended  On Site Evaluation by:   Reviewed with Physician:    Asa Saunas 05/09/2017 6:14 AM

## 2017-05-09 NOTE — BH Assessment (Signed)
This Clinical research associatewriter received called from Clydie BraunKaren, Crisis Coordinator/Nurse at [facility] to inform patient has been accepted [facility]. This writer informed Clydie BraunKaren once Dr. Toni Amendlapacs determines disposition will call [facility] if inpatient psychiatric hospitalization is recommended and will call her with disposition determination. Clydie BraunKaren provided her cell [phone] for followup.

## 2017-05-09 NOTE — ED Notes (Signed)
Patient was cooperative, took medication, ask for his nerve medication, and He said " I hope I can leave here soon"  Nurse introduced self to him and he smiled and was appropriate, patient did ask for back brace, nurse told him she would look at the brace and let him know, nurse did go to belongings room and brace has several straps, so it is not allowed on unit for safety reasons, nurse did let Patient know, and did offer him more pillows. Nurse will continue to to monitor for safety.

## 2017-05-09 NOTE — ED Notes (Signed)
Patient did take pain medication, he wanted to open package himself, nurse did allow him to open, he remains cooperative, nurse will pass report to oncoming nurse.

## 2017-05-09 NOTE — ED Notes (Addendum)
Nurse wasted hydrocodone 7.5 mg with Olivette RN  Patient did not see nurse open and became paranoid, and also states " I'm trying to get off of all those medications, but nurse did ask him if he was still having and pain and he states yes, and nurse will obtain medication from pharmacy and open in front of Patient and let him read label prior to opening, Patient is cooperative, but paranoid. Patient has been talking to wife on phone and is having less anxiety since speaking to her.

## 2017-05-09 NOTE — ED Notes (Signed)

## 2017-05-10 ENCOUNTER — Other Ambulatory Visit: Payer: Self-pay

## 2017-05-10 LAB — LIPID PANEL
Cholesterol: 162 mg/dL (ref 0–200)
HDL: 38 mg/dL — ABNORMAL LOW (ref >40)
LDL Cholesterol: 110 mg/dL — ABNORMAL HIGH (ref 0–99)
Total CHOL/HDL Ratio: 4.3 RATIO
Triglycerides: 70 mg/dL (ref <150)
VLDL: 14 mg/dL (ref 0–40)

## 2017-05-10 LAB — HEMOGLOBIN A1C
Hgb A1c MFr Bld: 4.8 % (ref 4.8–5.6)
Mean Plasma Glucose: 91.06 mg/dL

## 2017-05-10 LAB — TSH: TSH: 2.259 u[IU]/mL (ref 0.350–4.500)

## 2017-05-10 MED ORDER — OLANZAPINE 10 MG IM SOLR
5.0000 mg | Freq: Four times a day (QID) | INTRAMUSCULAR | Status: DC | PRN
  Administered 2017-05-11 – 2017-05-12 (×2): 5 mg via INTRAMUSCULAR
  Filled 2017-05-10 (×2): qty 10

## 2017-05-10 MED ORDER — OLANZAPINE 5 MG PO TABS
5.0000 mg | ORAL_TABLET | Freq: Every day | ORAL | Status: DC
  Filled 2017-05-10: qty 1

## 2017-05-10 NOTE — Progress Notes (Signed)
Patient continues to be very paranoid. Patient took all of his lenin off of his bed and out of the bathroom stating its all contaminated. Patient declined when asked if he wanted new lenin.

## 2017-05-10 NOTE — Plan of Care (Signed)
Patient is alert X 4. Patient denies SI, HI and AVH. Patient is paranoid still feel as though neighbors are out to get him. When taking medications patient would like to be shown medication packaging to make sure no one has tampered with packaging. Patient has mild confusion and complains of chronic back pain which is being managed with Klonopin. Patient is currently on CIWA score has been 0 this morning. Patient missed community meeting due to sleeping. Pain Managment: General experience of comfort will improve 05/10/2017 1056 - Not Progressing by Leamon ArntPowell, Kinzleigh Kandler K, RN   Coping: Ability to demonstrate self-control will improve 05/10/2017 1056 - Progressing by Leamon ArntPowell, Rhena Glace K, RN   Nurse will continue to monitor.
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
## 2017-05-10 NOTE — [facility] Group Notes (Signed)
LCSW Group Therapy Note   05/10/2017 1:15pm   Type of Therapy and Topic:  Group Therapy:  Trust and Honesty  Participation Level:  Did Not Attend  Description of Group:    In this group patients will be asked to explore the value of being honest.  Patients will be guided to discuss their thoughts, feelings, and behaviors related to honesty and trusting in others. Patients will process together how trust and honesty relate to forming relationships with peers, family members, and self. Each patient will be challenged to identify and express feelings of being vulnerable. Patients will discuss reasons why people are dishonest and identify alternative outcomes if one was truthful (to self or others). This group will be process-oriented, with patients participating in exploration of their own experiences, giving and receiving support, and processing challenge from other group members.   Therapeutic Goals: 1. Patient will identify why honesty is important to relationships and how honesty overall affects relationships.  2. Patient will identify a situation where they lied or were lied too and the  feelings, thought process, and behaviors surrounding the situation 3. Patient will identify the meaning of being vulnerable, how that feels, and how that correlates to being honest with self and others. 4. Patient will identify situations where they could have told the truth, but instead lied and explain reasons of dishonesty.   Summary of Patient Progress    Therapeutic Modalities:   Cognitive Behavioral Therapy Solution Focused Therapy Motivational Interviewing Brief Therapy  David Chopra  CUEBAS-COLON, LCSW 05/10/2017 12:54 PM  
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
## 2017-05-10 NOTE — Tx Team (Signed)
Initial Treatment Plan 05/10/2017 1:29 AM Tori Milksimothy L Falzon NUU:[id number]    PATIENT STRESSORS: Substance abuse Other: paranoia and delusional thoughts   PATIENT STRENGTHS: Average or above average intelligence Capable of independent living Communication skills General fund of knowledge Supportive family/friends   PATIENT IDENTIFIED PROBLEMS: Paranoid delusions 05/09/2017  Psychosis 05/09/2017  Sleep disturbance 05/09/2017  Chronic pain 05/09/2017               DISCHARGE CRITERIA:  Improved stabilization in mood, thinking, and/or behavior Motivation to continue treatment in a less acute level of care Verbal commitment to aftercare and medication compliance  PRELIMINARY DISCHARGE PLAN: Outpatient therapy Return to previous living arrangement  PATIENT/FAMILY INVOLVEMENT: This treatment plan has been presented to and reviewed with the patient, Tori Milksimothy L Frickey, and/or family member.  The patient and family have been given the opportunity to ask questions and make suggestions.  Galen ManilaAlexis E Calvary Difranco, RN 05/10/2017, 1:29 AM

## 2017-05-10 NOTE — Progress Notes (Signed)
Pt conts to be very paranoid. Pt refuse prn offer to him. Pt still thinks things are after him and in his room. Pt took mattress off of bed and put it on the floor and is lying on the floor. Pt remove sheets and refuse new sheets and a spread. Pt remained on Q15 mins safety checks. Will cont to monitor pt.

## 2017-05-10 NOTE — Plan of Care (Signed)
  Not Progressing New Admit Pain Managment: General experience of comfort will improve 05/10/2017 0135 - Not Progressing by Galen ManilaVigil, Ilyana Manuele E, RN Education: Knowledge of [place] [facility] Education information/materials will improve 05/10/2017 0135 - Not Progressing by Galen ManilaVigil, Lacy Taglieri E, RN Emotional status will improve 05/10/2017 0135 - Not Progressing by Galen ManilaVigil, Gabbi Whetstone E, RN Mental status will improve 05/10/2017 0135 - Not Progressing by Galen ManilaVigil, Arshdeep Bolger E, RN Verbalization of understanding the information provided will improve 05/10/2017 0135 - Not Progressing by Galen ManilaVigil, Aisia Correira E, RN Coping: Ability to demonstrate self-control will improve 05/10/2017 0135 - Not Progressing by Galen ManilaVigil, Kyson Kupper E, RN Health Behavior/Discharge Planning: Identification of resources available to assist in meeting health care needs will improve 05/10/2017 0135 - Not Progressing by Galen ManilaVigil, Reed Eifert E, RN Compliance with treatment plan for underlying cause of condition will improve 05/10/2017 0135 - Not Progressing by Galen ManilaVigil, Etienne Mowers E, RN Education: Will be free of psychotic symptoms 05/10/2017 0135 - Not Progressing by Galen ManilaVigil, Luna Audia E, RN Nutritional: Ability to achieve adequate nutritional intake will improve 05/10/2017 0135 - Not Progressing by Galen ManilaVigil, Caelin Rosen E, RN Safety: Ability to redirect hostility and anger into socially appropriate behaviors will improve 05/10/2017 0135 - Not Progressing by Galen ManilaVigil, Tanmay Halteman E, RN Ability to remain free from injury will improve 05/10/2017 0135 - Not Progressing by Galen ManilaVigil, Orval Dortch E, RN Self-Care: Ability to participate in self-care as condition permits will improve 05/10/2017 0135 - Not Progressing by Galen ManilaVigil, Tamarion Haymond E, RN
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
## 2017-05-11 ENCOUNTER — Encounter: Payer: Self-pay | Admitting: Psychiatry

## 2017-05-11 ENCOUNTER — Inpatient Hospital Stay: Payer: Managed Care, Other (non HMO)

## 2017-05-11 DIAGNOSIS — S61411A Laceration without foreign body of right hand, initial encounter: Secondary | ICD-10-CM | POA: Insufficient documentation

## 2017-05-11 DIAGNOSIS — F29 Unspecified psychosis not due to a substance or known physiological condition: Secondary | ICD-10-CM

## 2017-05-11 MED ORDER — LORAZEPAM 2 MG/ML IJ SOLN: 2.0000 mg | Freq: Once | INTRAMUSCULAR | Status: AC

## 2017-05-11 MED ORDER — LORAZEPAM 2 MG/ML IJ SOLN
INTRAMUSCULAR | Status: AC
  Administered 2017-05-11: 07:00:00
  Filled 2017-05-11: qty 1

## 2017-05-11 MED ORDER — CLONAZEPAM 1 MG PO TABS
1.0000 mg | ORAL_TABLET | Freq: Three times a day (TID) | ORAL | Status: DC
  Administered 2017-05-11 – 2017-05-14 (×8): 1 mg via ORAL
  Filled 2017-05-11 (×8): qty 1

## 2017-05-11 MED ORDER — POLYETHYLENE GLYCOL 3350 17 G PO PACK
17.0000 g | PACK | Freq: Every day | ORAL | Status: DC
  Administered 2017-05-11 – 2017-05-23 (×11): 17 g via ORAL
  Filled 2017-05-11 (×13): qty 1

## 2017-05-11 MED ORDER — HALOPERIDOL LACTATE 5 MG/ML IJ SOLN
5.0000 mg | Freq: Once | INTRAMUSCULAR | Status: AC
  Administered 2017-05-11: 5 mg via INTRAMUSCULAR

## 2017-05-11 MED ORDER — HALOPERIDOL LACTATE 5 MG/ML IJ SOLN
INTRAMUSCULAR | Status: AC
  Filled 2017-05-11: qty 1

## 2017-05-11 MED ORDER — OLANZAPINE 10 MG PO TABS: 10.0000 mg | ORAL_TABLET | Freq: Every day | ORAL | Status: DC

## 2017-05-11 MED ORDER — BACITRACIN-NEOMYCIN-POLYMYXIN 400-5-5000 EX OINT
TOPICAL_OINTMENT | CUTANEOUS | Status: DC | PRN
  Administered 2017-05-13: 1 via TOPICAL
  Filled 2017-05-11: qty 1

## 2017-05-11 MED ORDER — OLANZAPINE 5 MG PO TABS
5.0000 mg | ORAL_TABLET | Freq: Two times a day (BID) | ORAL | Status: DC
  Administered 2017-05-11 – 2017-05-13 (×4): 5 mg via ORAL
  Filled 2017-05-11 (×5): qty 1

## 2017-05-11 NOTE — Progress Notes (Signed)
Patient in the day room with sitter/ MHT. Patient has a visitor,(Spouse). Patient seems happy to see spouse and is appropriate with visitor on the unit. Nurse will continue to monitor.

## 2017-05-11 NOTE — BHH Counselor (Signed)
Adult Comprehensive Assessment  Patient ID: David Reeves, male   DOB: 1970/08/06, 47 y.o.   MRN: [id number]  Information Source: Information source: Patient  Current Stressors:  Educational / Learning stressors: none reported Employment / Job issues: none reported Family Relationships: pt reports his wife is against him, he does not haveany other family member Surveyor, quantityinancial / Lack of resources (include bankruptcy): pt receives disability Housing / Lack of housing: pt owns home Physical health (include injuries & life threatening diseases): pt reports he has multiple psine surgeries Social relationships: no friends Substance abuse: THC Bereavement / Loss: Mother in 2016  Living/Environment/Situation:  Living Arrangements: Spouse/significant other, Children Living conditions (as described by patient or guardian): pt did not answer question How long has patient lived in current situation?: pt did not answer question What is atmosphere in current home: Dangerous  Family History:  Marital status: Married Number of Years Married: 25 What types of issues is patient dealing with in the relationship?: pt reports his wofe does not know how to del with his problems Additional relationship information: n/a Are you sexually active?: No What is your sexual orientation?: straight Has your sexual activity been affected by drugs, alcohol, medication, or emotional stress?: pt states yes, by his meds Does patient have children?: Yes How many children?: 1 How is patient's relationship with their children?: 48 yo daughter, good relationship  Childhood History:  By whom was/is the patient raised?: Mother Additional childhood history information: dad left when he was a baby Description of patient's relationship with caregiver when they were a child: good relationship Patient's description of current relationship with people who raised him/her: mom deceased How were you disciplined when you got in  trouble as a child/adolescent?: grounded Does patient have siblings?: No Did patient suffer any verbal/emotional/physical/sexual abuse as a child?: No Did patient suffer from severe childhood neglect?: No Has patient ever been sexually abused/assaulted/raped as an adolescent or adult?: No Was the patient ever a victim of a crime or a disaster?: No Witnessed domestic violence?: No Has patient been effected by domestic violence as an adult?: No  Education:  Highest grade of school patient has completed: some college Currently a student?: No Name of school: n/a Learning disability?: No  Employment/Work Situation:   Employment situation: On disability Why is patient on disability: MH and medical How long has patient been on disability: pt reported since October 2018 Patient's job has been impacted by current illness: Yes Describe how patient's job has been impacted: due to his psine problems What is the longest time patient has a held a job?: 9 years 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 Has patient ever been in the [employer] and Companymilitary?: No Has patient ever served in combat?: No Did You Receive Any Psychiatric Treatment/Services While in Equities traderthe Military?: No Are There Guns or Other Weapons in Your Home?: No  Financial Resources:   Surveyor, quantityinancial resources: Insurance claims handlereceives SSDI Does patient have a Lawyerrepresentative payee or guardian?: No  Alcohol/Substance Abuse:   What has been your use of drugs/alcohol within the last 12 months?: THC daily If attempted suicide, did drugs/alcohol play a role in this?: No Alcohol/Substance Abuse Treatment Hx: Denies past history Has alcohol/substance abuse ever caused legal problems?: No  Social Support System:   Forensic psychologistatient's Community Support System: None Describe Community Support System: pt reports he does not have a support system Type of faith/religion: Baptist How does patient's faith help to cope with current illness?: does not  practice  Leisure/Recreation:  Leisure and Hobbies: pt reports that his hobby is "lay on bed due to his anxiety"  Strengths/Needs:   What things does the patient do well?: pt reports that his hobby is "lay on bed due to his anxiety" In what areas does patient struggle / problems for patient: pt reports sleeping, and getting up and work  Discharge Plan:   Does patient have access to transportation?: No Plan for no access to transportation at discharge: public transportation Will patient be returning to same living situation after discharge?: No Plan for living situation after discharge: pt reports he does not know where he is going to live  Currently receiving community mental health services: No If no, would patient like referral for services when discharged?: Yes (What county?)([place]) Does patient have financial barriers related to discharge medications?: No  Summary/Recommendations:   Summary and Recommendations (to be completed by the evaluator): Patient is a 47 year old male admitted with history of anxiety, depression, psychosis, substance use issues, who was admitted under IVC. Patient will benefit from crisis stabilization, medication evaluation, group therapy and psychoeducation. In addition to case management for discharge planning. At discharge it is recommended that patient adhere to the established discharge plan and continue treatment.   David Husband  Reeves. 05/11/2017

## 2017-05-11 NOTE — Plan of Care (Signed)
Patient has disorganized thinking, very paranoid about "Being Set Up."  Patient is now has a Recruitment consultantsafety sitter due to irritability, self harm, and breaking glass on a door and cutting his hand. Patient has been in his room with safety sitter within arms reach. Patient has been resting in bed since given IM medications. Surgeon on call came and placed steri strips on right thumb. Patients right hand and right foot has been X ray. Nurse will continue to monitor.  Pain Managment: General experience of comfort will improve 05/11/2017 1140 - Progressing by Leamon ArntPowell, Hamna Asa K, RN   Education: Knowledge of [facility] Education information/materials will improve 05/11/2017 1140 - Not Progressing by Leamon ArntPowell, Yula Crotwell K, RN Emotional status will improve 05/11/2017 1140 - Not Progressing by Leamon ArntPowell, Dorota Heinrichs K, RN Mental status will improve 05/11/2017 1140 - Not Progressing by Leamon ArntPowell, Nkechi Linehan K, RN Verbalization of understanding the information provided will improve 05/11/2017 1140 - Progressing by Leamon ArntPowell, Rosalio Catterton K, RN   Coping: Ability to demonstrate self-control will improve 05/11/2017 1140 - Progressing by Leamon ArntPowell, Khale Nigh K, RN   Health Behavior/Discharge Planning: Identification of resources available to assist in meeting health care needs will improve 05/11/2017 1140 - Not Progressing by Leamon ArntPowell, Joseeduardo Brix K, RN Compliance with treatment plan for underlying cause of condition will improve 05/11/2017 1140 - Not Progressing by Leamon ArntPowell, Tavyn Kurka K, RN

## 2017-05-11 NOTE — Progress Notes (Signed)
Patient was in room with sitter/MHT and Child psychotherapistsocial worker. Patient expressed he would like something to help his bowel moves, RN informed patient MD will be notified. Patient still expresses some paranoia but is much calmer than this morning. RN called MD, miralax was order patient took miralax without problems. Sitter/ MHT within arms reach. Nurse will continue to monitor.

## 2017-05-11 NOTE — Progress Notes (Signed)
Patient in room with safety sitter. Patient is laying in bed eyes closed and resting. Patient expresses no concern at this time. Nurse will continue to monitor.

## 2017-05-11 NOTE — BHH Group Notes (Signed)
LCSW Group Therapy Note 05/11/2017 1:15pm  Type of Therapy and Topic: Group Therapy: Feelings Around Returning Home & Establishing a Supportive Framework and Supporting Oneself When Supports Not Available  Participation Level: Did Not Attend  Description of Group:  Patients first processed thoughts and feelings about upcoming discharge. These included fears of upcoming changes, lack of change, new living environments, judgements and expectations from others and overall stigma of mental health issues. The group then discussed the definition of a supportive framework, what that looks and feels like, and how do to discern it from an unhealthy non-supportive network. The group identified different types of supports as well as what to do when your family/friends are less than helpful or unavailable  Therapeutic Goals  1. Patient will identify one healthy supportive network that they can use at discharge. 2. Patient will identify one factor of a supportive framework and how to tell it from an unhealthy network. 3. Patient able to identify one coping skill to use when they do not have positive supports from others. 4. Patient will demonstrate ability to communicate their needs through discussion and/or role plays.  Summary of Patient Progress:    Therapeutic Modalities Cognitive Behavioral Therapy Motivational Interviewing   Larene Ascencio  CUEBAS-COLON, LCSW 05/11/2017 2:39 PM

## 2017-05-11 NOTE — Consult Note (Signed)
Surgical Consultation  05/11/2017  David Reeves is an 47 y.o. male.   Referring Physician: [name]  CC: Laceration right hand  HPI: This patient admitted to behavioral health.  I was called by the physician in the unit concerning a patient who had struck a door with his hand and suffered a laceration.  Promptly responded.  The patient was found lying in his bed with minimal blood on some sheets.  Past Medical History:  Diagnosis Date  . Back pain 2014  . Bone spur   . Bulging disc 2014  . Degenerative disc disease   . Osteoarthritis   . Panic anxiety syndrome   . Taking multiple medications for chronic disease     Past Surgical History:  Procedure Laterality Date  . HERNIA REPAIR Left 2006,2014   [facility]  . TOE SURGERY Right 2007    Family History  Problem Relation Age of Onset  . Diabetes Mother   . Diabetes Maternal Uncle   . Diabetes Maternal Grandmother   . Heart disease Maternal Grandmother     Social History:  reports that  has never smoked. he has never used smokeless tobacco. He reports that he does not drink alcohol or use drugs.  Allergies:  Allergies  Allergen Reactions  . Amitriptyline Other (See Comments)    Urine retention  . Buspirone Other (See Comments)    Kidney pain  . Ciprofloxacin Other (See Comments)    hallucinations  . Doxepin Other (See Comments)    "Didn't feel right"  . Duloxetine Hcl Other (See Comments)    Sweating  . Escitalopram Other (See Comments)    Agitation  . Penicillins Other (See Comments)    Unknown -- childhood reaction  . Sertraline Other (See Comments)    Urine retention  . Trazodone Other (See Comments)    abd pain, urine retention    Medications reviewed.   Review of Systems:   Review of Systems  Unable to perform ROS: Psychiatric disorder     Physical Exam:  BP (!) 138/100 (BP Location: Left Arm)   Pulse (!) 133   Temp 99.1 F (37.3 C) (Oral)   Resp 18   Ht 5\' 9"  (1.753 m)   Wt 183 lb (83  kg)   SpO2 100%   BMI 27.02 kg/m   Physical Exam  Musculoskeletal: Normal range of motion. He exhibits no edema.  Right thumb dorsal surface 10-12 mm laceration with intact viable flap.  No bleeding.  Neurovascularly intact      Results for orders placed or performed during the hospital encounter of 05/09/17 (from the past 48 hour(s))  Hemoglobin A1c     Status: None   Collection Time: 05/10/17  6:26 AM  Result Value Ref Range   Hgb A1c MFr Bld 4.8 4.8 - 5.6 %    Comment: (NOTE) Pre diabetes:          5.7%-6.4% Diabetes:              >6.4% Glycemic control for   <7.0% adults with diabetes    Mean Plasma Glucose 91.06 mg/dL    Comment: Performed at [facility], [address] [place], [place] [address]  Lipid panel     Status: Abnormal   Collection Time: 05/10/17  6:26 AM  Result Value Ref Range   Cholesterol 162 0 - 200 mg/dL   Triglycerides 70 <706<150 mg/dL   HDL 38 (L) >23>40 mg/dL   Total CHOL/HDL Ratio 4.3 RATIO  VLDL 14 0 - 40 mg/dL   LDL Cholesterol 161 (H) 0 - 99 mg/dL    Comment:        Total Cholesterol/HDL:CHD Risk Coronary Heart Disease Risk Table                     Men   Women  1/2 Average Risk   3.4   3.3  Average Risk       5.0   4.4  2 X Average Risk   9.6   7.1  3 X Average Risk  23.4   11.0        Use the calculated Patient Ratio above and the CHD Risk Table to determine the patient's CHD Risk.        ATP III CLASSIFICATION (LDL):  <100     mg/dL   Optimal  096-045  mg/dL   Near or Above                    Optimal  130-159  mg/dL   Borderline  409-811  mg/dL   High  >914     mg/dL   Very High Performed at [facility], [address] [place], [place] [address]   TSH     Status: None   Collection Time: 05/10/17  6:26 AM  Result Value Ref Range   TSH 2.259 0.350 - 4.500 uIU/mL    Comment: Performed by a 3rd Generation assay with a functional sensitivity of <=0.01 uIU/mL. Performed at [facility], [address] [place], [place] [address]    Dg Hand Complete Right  Result Date: 05/11/2017 CLINICAL DATA:  47 year old male with history of lacerations to the right hand, most severe on the right thumb, after punching a window. EXAM: RIGHT HAND - COMPLETE 3+ VIEW COMPARISON:  No priors. FINDINGS: There is no evidence of fracture or dislocation. There is no evidence of arthropathy or other focal bone abnormality. Soft tissues are unremarkable. IMPRESSION: Negative. Electronically Signed   By: Trudie Reed M.D.   On: 05/11/2017 07:53    Assessment/Plan:  Laceration of the right thumb.  There are other small abrasions on his knuckles but a film suggests no sign of fracture or foreign body.  There is no active bleeding.  The flap is viable.  With that in mind Steri-Strips were placed to reapproximate the minimal displaced flap.  Discussed with nursing.  Lattie Haw, MD, FACS

## 2017-05-11 NOTE — Progress Notes (Signed)
Pt became very agitated throwing himself on the floor. Pt was complaining of muscle pain. Tylenorl, vistaril, and hydrocodone by mouth patient refuse. Pt got up off the floor and went to his room and then threw himself on the floor again. Dr. Brunetta JeansSibedi called,and stated, "give the patient Zyprexa 5mg  IM as on MAR". Injection given in left gluteus maximus. Patient then got up walk to other side of hallway and start punching door until it brook. Dr. Brunetta JeansSibedi called again,. New order Haldol 5mg  IM and Ativan 2mg  given along with xray of right hand. IM given in right deltoid. Pt placed on 1:1 for safety

## 2017-05-11 NOTE — Progress Notes (Signed)
Call Dr. Joseph ArtSubedi and patient 1:1 was d/c. Per Dr.Subedi order can be d/c but if the patient start to present with aggressive or self harm patient is to be place back on 1:1.

## 2017-05-11 NOTE — Plan of Care (Signed)
  Progressing Pain Managment: General experience of comfort will improve 05/11/2017 0012 - Progressing by Merlene PullingBrigman, Tyri Elmore A, RN Education: Knowledge of [facility] Education information/materials will improve 05/11/2017 0012 - Progressing by Merlene PullingBrigman, Abshir Paolini A, RN Emotional status will improve 05/11/2017 0012 - Progressing by Merlene PullingBrigman, Enijah Furr A, RN Mental status will improve 05/11/2017 0012 - Progressing by Merlene PullingBrigman, Zoey Gilkeson A, RN Verbalization of understanding the information provided will improve 05/11/2017 0012 - Progressing by Merlene PullingBrigman, Vernette Moise A, RN Coping: Ability to demonstrate self-control will improve 05/11/2017 0012 - Progressing by Merlene PullingBrigman, Khalilah Hoke A, RN Health Behavior/Discharge Planning: Identification of resources available to assist in meeting health care needs will improve 05/11/2017 0012 - Progressing by Merlene PullingBrigman, Danella Philson A, RN Compliance with treatment plan for underlying cause of condition will improve 05/11/2017 0012 - Progressing by Merlene PullingBrigman, Haylee Mcanany A, RN Education: Will be free of psychotic symptoms 05/11/2017 0012 - Progressing by Merlene PullingBrigman, Tashauna Caisse A, RN Nutritional: Ability to achieve adequate nutritional intake will improve 05/11/2017 0012 - Progressing by Merlene PullingBrigman, Katrinia Straker A, RN Safety: Ability to redirect hostility and anger into socially appropriate behaviors will improve 05/11/2017 0012 - Progressing by Merlene PullingBrigman, Monik Lins A, RN Ability to remain free from injury will improve 05/11/2017 0012 - Progressing by Merlene PullingBrigman, Rhydian Baldi A, RN Self-Care: Ability to participate in self-care as condition permits will improve 05/11/2017 0012 - Progressing by Merlene PullingBrigman, Fabiola Mudgett A, RN

## 2017-05-11 NOTE — Progress Notes (Signed)
Patient asleep in room, sitter within arms reach. Patient meal tray for lunch is in room awaiting patient when he wakes up. Nurse will continue to monitor.

## 2017-05-11 NOTE — Progress Notes (Signed)
Patient is in room asleep. Patient is now a one to one due to self harm. Sitter is within arms reach. Nurse will continue to monitor.

## 2017-05-11 NOTE — Progress Notes (Signed)
[facility] MD Progress Note  05/11/2017 12:22 PM David Reeves  MRN:  [id number] Subjective:    Pt  is a 47 year old man with h/o anxiety, depression, substance use issues , who was admitted under IVC for psychosis, delusional, paranoid.   Per nursing, patient paranoid refuses to drink water in the water pitcher wants water from the sink in his bedroom.  wants to call and report a crime that took place with his daughter and he is being set up,  took all of his lenin off of his bed and out of the bathroom stating its all contaminated, pt refused hs zyprexa and refused BP.  Pt needed prn im Zyprexa 5mg   this am for agitation, throwing himself on the floor. Also needed prn haldol 5 mg im and ativan 2mg  for aggression, punched and broke glass door. Pt anxious, guarded, unable to tell the trigger for his aggression. Continue to be paranoid, denies AVH. Denies SI/HI. Xray of hand  And leg- no bony injury, no FB. Surgery evaluated for hand wound- Steri-Strips were placed. Pt on 1:1.    Principal Problem: <principal problem not specified> Diagnosis:   Patient Active Problem List   Diagnosis Date Noted  . Laceration of right hand without foreign body [S61.411A]   . Psychosis (HCC) [F29] 05/09/2017  . Cannabis abuse [F12.10] 05/09/2017  . Closed compression fracture of L1 lumbar vertebra (HCC) [S32.010A] 05/24/2016  . Lumbar stenosis with neurogenic claudication [M48.062] 11/01/2015  . Chronic 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 [M54.41, G89.29] 11/08/2014  . Hx of hemorrhoids [Z87.19] 11/08/2014  . AAA (abdominal aortic aneurysm) (HCC) [I71.4] 09/22/2014  . Chronic pain associated with significant psychosocial dysfunction [G89.4] 09/22/2014  . Panic attack [F41.0] 09/22/2014  . AB (asthmatic bronchitis) [J45.909] 08/17/2014  . 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 Backache [M54.9] 08/17/2014  . Lumbosacral spondylosis without myelopathy [M47.817]  08/17/2014  . Disorder of male genital organs [N50.9] 08/17/2014  . Brash [R12] 08/17/2014  . Low back pain [M54.5] 08/17/2014  . Tendon nodule [M67.90] 08/17/2014  . 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 Hernia, inguinal, right [K40.90] 08/17/2014  . Fast heart beat [R00.0] 08/17/2014  . Illness [R69] 08/17/2014  . Inguinal hernia [K40.90] 10/14/2012   Total Time spent with patient: 25 min  Past Psychiatric History: no new info  Past Medical History:  Past Medical History:  Diagnosis Date  . Back pain 2014  . Bone spur   . Bulging disc 2014  . Degenerative disc disease   . Osteoarthritis   . Panic anxiety syndrome   . Taking multiple medications for chronic disease     Past Surgical History:  Procedure Laterality Date  . HERNIA REPAIR Left 2006,2014   [facility]  . TOE SURGERY Right 2007   Family History:  Family History  Problem Relation Age of Onset  . Diabetes Mother   . Diabetes Maternal Uncle   . Diabetes Maternal Grandmother   . Heart disease Maternal Grandmother    Family Psychiatric  History: no new info Social History:  Social History   Substance and Sexual Activity  Alcohol Use No     Social History   Substance and Sexual Activity  Drug Use No    Social History   Socioeconomic History  . Marital status: Married    Spouse name: None  . Number of children: None  . Years of education: None  . Highest education level: None  Social Needs  .  Financial resource strain: None  . Food insecurity - worry: None  . Food insecurity - inability: None  . Transportation needs - medical: None  . Transportation needs - non-medical: None  Occupational History  . None  Tobacco Use  . Smoking status: Never Smoker  . Smokeless tobacco: Never Used  Substance and Sexual Activity  . Alcohol use: No  . Drug use: No  . Sexual activity: None  Other Topics Concern  . None  Social History Narrative  . None   Additional Social History:     Pain Medications: Vicodin Prescriptions: Vicodin, Klonopin History of alcohol / drug use?: Yes Longest period of sobriety (when/how long): Unknown Negative Consequences of Use: Personal relationships, Financial Withdrawal Symptoms: (none) Name of Substance 1: marijuana 1 - Age of First Use: unknown 1 - Amount (size/oz): bowl 1 - Frequency: multiple times daily 1 - Duration: unknown 1 - Last Use / Amount: "a few days ago"                  Sleep: Poor  Appetite:  Fair  Current Medications: Current Facility-Administered Medications  Medication Dose Route Frequency Provider Last Rate Last Dose  . acetaminophen (TYLENOL) tablet 650 mg  650 mg Oral Q6H PRN Clapacs, [name], MD      . alum & mag hydroxide-simeth (MAALOX/MYLANTA) 200-200-20 MG/5ML suspension 30 mL  30 mL Oral Q4H PRN Clapacs, [name], MD   30 mL at 05/11/17 0258  . clonazePAM (KLONOPIN) tablet 1 mg  1 mg Oral TID Clapacs, [name], MD   1 mg at 05/10/17 1839  . HYDROcodone-acetaminophen (NORCO) 7.5-325 MG per tablet 1 tablet  1 tablet Oral Q6H PRN Clapacs, [name], MD   1 tablet at 05/10/17 0231  . hydrOXYzine (ATARAX/VISTARIL) tablet 50 mg  50 mg Oral TID PRN Clapacs, [name], MD   50 mg at 05/10/17 2144  . LORazepam (ATIVAN) injection 0-4 mg  0-4 mg Intravenous Q12H Clapacs, [name], MD       Or  . LORazepam (ATIVAN) tablet 0-4 mg  0-4 mg Oral Q12H Clapacs, [name], MD      . magnesium hydroxide (MILK OF MAGNESIA) suspension 30 mL  30 mL Oral Daily PRN Clapacs, [name], MD      . neomycin-bacitracin-polymyxin (NEOSPORIN) ointment   Topical PRN [name], MD      . OLANZapine [facility]) injection 5 mg  5 mg Intramuscular Q6H PRN [name], MD   5 mg at 05/11/17 0659  . OLANZapine (ZYPREXA) tablet 5 mg  5 mg Oral QHS [name], MD      . thiamine (VITAMIN B-1) tablet 100 mg  100 mg Oral Daily Clapacs, [name], MD   Stopped at 05/11/17 0913   Or  . thiamine (B-1) injection 100 mg  100 mg Intravenous  Daily Clapacs, [name], MD        Lab Results:  Results for orders placed or performed during the hospital encounter of 05/09/17 (from the past 48 hour(s))  Hemoglobin A1c     Status: None   Collection Time: 05/10/17  6:26 AM  Result Value Ref Range   Hgb A1c MFr Bld 4.8 4.8 - 5.6 %    Comment: (NOTE) Pre diabetes:          5.7%-6.4% Diabetes:              >6.4% Glycemic control for   <7.0% adults with diabetes    Mean Plasma  Glucose 91.06 mg/dL    Comment: Performed at [facility], [address], [place], [place] [address]  Lipid panel     Status: Abnormal   Collection Time: 05/10/17  6:26 AM  Result Value Ref Range   Cholesterol 162 0 - 200 mg/dL   Triglycerides 70 <657 mg/dL   HDL 38 (L) >84 mg/dL   Total CHOL/HDL Ratio 4.3 RATIO   VLDL 14 0 - 40 mg/dL   LDL Cholesterol 696 (H) 0 - 99 mg/dL    Comment:        Total Cholesterol/HDL:CHD Risk Coronary Heart Disease Risk Table                     Men   Women  1/2 Average Risk   3.4   3.3  Average Risk       5.0   4.4  2 X Average Risk   9.6   7.1  3 X Average Risk  23.4   11.0        Use the calculated Patient Ratio above and the CHD Risk Table to determine the patient's CHD Risk.        ATP III CLASSIFICATION (LDL):  <100     mg/dL   Optimal  295-284  mg/dL   Near or Above                    Optimal  130-159  mg/dL   Borderline  132-440  mg/dL   High  >102     mg/dL   Very High Performed at [facility], [address] [place], [place] [address]   TSH     Status: None   Collection Time: 05/10/17  6:26 AM  Result Value Ref Range   TSH 2.259 0.350 - 4.500 uIU/mL    Comment: Performed by a 3rd Generation assay with a functional sensitivity of <=0.01 uIU/mL. Performed at [facility], [address] [place], [place] [address]     Blood Alcohol level:  Lab Results  Component Value Date   [facility] <10 05/08/2017    Metabolic Disorder Labs: Lab Results  Component Value Date   HGBA1C  4.8 05/10/2017   MPG 91.06 05/10/2017   No results found for: PROLACTIN Lab Results  Component Value Date   CHOL 162 05/10/2017   TRIG 70 05/10/2017   HDL 38 (L) 05/10/2017   CHOLHDL 4.3 05/10/2017   VLDL 14 05/10/2017   LDLCALC 110 (H) 05/10/2017   LDLCALC 112 (H) 11/13/2015    Physical Findings: AIMS:  , ,  ,  ,    CIWA:  CIWA-Ar Total: 0 COWS:  COWS Total Score: 0  Musculoskeletal: Strength & Muscle Tone: within normal limits Gait & Station: normal Patient leans:   Psychiatric Specialty Exam: Physical Exam  Nursing note and vitals reviewed.   ROS  Blood pressure (!) 138/100, pulse (!) 133, temperature 99.1 F (37.3 C), temperature source Oral, resp. rate 18, height 5\' 9"  (1.753 m), weight 83 kg (183 lb), SpO2 100 %.Body mass index is 27.02 kg/m.  General Appearance:Casual  Eye Contact:poor, guarded  Speech:Pressured  Volume:Decreased  Mood:Anxious and Irritable  Affect:Congruent and Constricted, anxious  Thought Process:Disorganized  Orientation:Full (Time, Place, and Person)  Thought Content:Illogical and Paranoid,  delusional  Suicidal Thoughts:No  Homicidal Thoughts:No  Memory:Immediate;Fair Recent;Fair Remote;Fair  Judgement:Impaired  Insight:Shallow  Psychomotor Activity:Restlessness  Concentration:Concentration:Poor  Recall:Fair  Fund of Knowledge:Fair  Language:Fair  Akathisia:No  Handed:Right  AIMS (if indicated):   Assets:Communication Skills Housing Resilience Social Support  ADL's:Intact  Cognition:WNL  Sleep: 4.15              Treatment Plan Summary: Daily contact with patient to assess and evaluate symptoms and progress in treatment and Medication management.  Pt with h/o anxiety, depression, substance use issues presents with psychosis, THC abuse. On prescription opioid and benzo.  Pt psychotic, aggressive. Increase  zyprexa.  Cont 1:1.   Observation  Level/Precautions:  15 minute checks  Laboratory:  CBC Chemistry Profile UDS UA  Psychotherapy:    Medications:    Consultations:    Discharge Concerns:    Estimated LOS:  Other:     Physician Treatment Plan for Primary Diagnosis: <principal problem not specified> Long Term Goal(s): Improvement in symptoms so as ready for discharge  Short Term Goals: Ability to identify changes in lifestyle to reduce recurrence of condition will improve, Ability to verbalize feelings will improve, Ability to disclose and discuss suicidal ideas, Ability to demonstrate self-control will improve, Ability to identify and develop effective coping behaviors will improve, Ability to maintain clinical measurements within normal limits will improve, Compliance with prescribed medications will improve and Ability to identify triggers associated with substance abuse/mental health issues will improve  Physician Treatment Plan for Secondary Diagnosis: Active Problems:   Psychosis (HCC)  Long Term Goal(s): Improvement in symptoms so as ready for discharge  Short Term Goals: Ability to identify changes in lifestyle to reduce recurrence of condition will improve, Ability to verbalize feelings will improve, Ability to disclose and discuss suicidal ideas, Ability to demonstrate self-control will improve, Ability to identify and develop effective coping behaviors will improve, Ability to maintain clinical measurements within normal limits will improve, Compliance with prescribed medications will improve and Ability to identify triggers associated with substance abuse/mental health issues will improve  I certify that inpatient services furnished can reasonably be expected to improve the patient's condition.       Beverly SessionsJagannath Raine Blodgett, MD 05/11/2017, 12:22 PM

## 2017-05-12 DIAGNOSIS — F23 Brief psychotic disorder: Secondary | ICD-10-CM

## 2017-05-12 MED ORDER — LORAZEPAM 2 MG PO TABS
2.0000 mg | ORAL_TABLET | Freq: Four times a day (QID) | ORAL | Status: DC | PRN
  Administered 2017-05-12 – 2017-05-13 (×4): 2 mg via ORAL
  Filled 2017-05-12 (×5): qty 1

## 2017-05-12 MED ORDER — MAGNESIUM CITRATE PO SOLN
1.0000 | Freq: Once | ORAL | Status: AC
  Administered 2017-05-12: 1 via ORAL
  Filled 2017-05-12: qty 296

## 2017-05-12 MED ORDER — LORAZEPAM 2 MG/ML IJ SOLN
2.0000 mg | Freq: Once | INTRAMUSCULAR | Status: AC
  Administered 2017-05-12: 2 mg via INTRAMUSCULAR
  Filled 2017-05-12: qty 1

## 2017-05-12 MED ORDER — HALOPERIDOL LACTATE 5 MG/ML IJ SOLN
10.0000 mg | Freq: Once | INTRAMUSCULAR | Status: AC
  Administered 2017-05-12: 10 mg via INTRAMUSCULAR
  Filled 2017-05-12: qty 2

## 2017-05-12 MED ORDER — HALOPERIDOL LACTATE 5 MG/ML IJ SOLN: 10.0000 mg | Freq: Four times a day (QID) | INTRAMUSCULAR | Status: DC | PRN

## 2017-05-12 MED ORDER — LORAZEPAM 2 MG/ML IJ SOLN: 2.0000 mg | Freq: Four times a day (QID) | INTRAMUSCULAR | Status: DC | PRN

## 2017-05-12 MED ORDER — HALOPERIDOL 5 MG PO TABS
10.0000 mg | ORAL_TABLET | Freq: Four times a day (QID) | ORAL | Status: DC | PRN
  Administered 2017-05-12 – 2017-05-16 (×5): 10 mg via ORAL
  Filled 2017-05-12 (×6): qty 2

## 2017-05-12 MED ORDER — DIPHENHYDRAMINE HCL 50 MG/ML IJ SOLN
50.0000 mg | Freq: Once | INTRAMUSCULAR | Status: AC
  Administered 2017-05-12: 50 mg via INTRAMUSCULAR
  Filled 2017-05-12: qty 1

## 2017-05-12 NOTE — Progress Notes (Addendum)
2000-Patient visiting with wife and daughter upon my arrival. After visit, patient went to room and laid down. Remains on 1:1 sitter due to impulsivity and aggression potential.  2100-Patient reports having large BM after Mag Citrate administration. Patient is calm and minimally verbal throughout interaction. Eye contact is poor. Attitude towards staff is improved. Denies SI, HI, AVH, and anxiety. Remains depressed due to missing family. Complains of back pain 8/10, given Vicodin with some relief reported. Will continue to monitor throughout the shift.  2200- Appears to be sleeping at this time. Breathing is even and unlabored. No distress noted.  2300- Patient remains asleep. 0000- Patient remains asleep.  0100- Patient remains asleep. 0200- Patient is awake and requests PRN for anxiety and agitation. Given Haldol, Vistaril, and Ativan PRN @0145 . Patient requests and is provided a snack. Patient is currently looking at a magazine.  0300- Patient lying in bed with light off. No distress noted. 0330- Patient is currently sleeping. Breathing is even and unlabored. No distress noted. 0400- Patient is asleep. 0500- Patient is asleep. 0600- Patient remains asleep. Vitals obtained at bedside. Patient returned to sleep afterwards. Will endorse care to oncoming shift.

## 2017-05-12 NOTE — Progress Notes (Addendum)
[facility] MD Progress Note  05/12/2017 2:30 PM Wright Gravely  MRN:  [id number]  Subjective:    [name] met with treatment team today. He reports feeling better but still seems rather paranoid. He is worried that there is "fentanyl" in the hospital that makes him "dizzy and sit down on the floor". He has 1:1 sitter as he has several angry outbursts with wall punching and injury to his hand. Skin cut was repaired by on call surgeon on Saturday but last night patient punched the wall again. He likes tha medicines. Feels safe here.  Treatment plan. We will continue Zyprexa for psychosis. Haldol and Ativan are available for agitation. We continue Clonazepam 1 mg TID as in the community alomg with Percocet for back pain. He is wearing a brace.  Social/disposition. He will return to his family. Follow up with [facility].  Principal Problem: Brief psychotic disorder [facility]) Diagnosis:   Patient Active Problem List   Diagnosis Date Noted  . Brief psychotic disorder ([place]) [F23] 05/09/2017    Priority: High  . Laceration of right hand without foreign body [S61.411A]   . Cannabis use disorder, moderate, dependence ([place]) [F12.20] 05/09/2017  . Closed compression fracture of L1 lumbar vertebra ([place]) [S32.010A] 05/24/2016  . Lumbar stenosis with neurogenic claudication [M48.062] 11/01/2015  . Chronic 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 [M54.41, G89.29] 11/08/2014  . Hx of hemorrhoids [Z87.19] 11/08/2014  . AAA (abdominal aortic aneurysm) ([place]) [I71.4] 09/22/2014  . Chronic pain associated with significant psychosocial dysfunction [G89.4] 09/22/2014  . Panic attack [F41.0] 09/22/2014  . AB (asthmatic bronchitis) [J45.909] 08/17/2014  . 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 Backache [M54.9] 08/17/2014  . Lumbosacral spondylosis without myelopathy [M47.817] 08/17/2014  . Disorder of male genital organs [N50.9] 08/17/2014  . Brash [R12] 08/17/2014  . Low  back pain [M54.5] 08/17/2014  . Tendon nodule [M67.90] 08/17/2014  . 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 Hernia, inguinal, right [K40.90] 08/17/2014  . Fast heart beat [R00.0] 08/17/2014  . Illness [R69] 08/17/2014  . Inguinal hernia [K40.90] 10/14/2012   Total Time spent with patient: 30 minutes  Past Psychiatric History: depression/anxiety  Past Medical History:  Past Medical History:  Diagnosis Date  . Back pain 2014  . Bone spur   . Bulging disc 2014  . Degenerative disc disease   . Osteoarthritis   . Panic anxiety syndrome   . Taking multiple medications for chronic disease     Past Surgical History:  Procedure Laterality Date  . HERNIA REPAIR Left 2006,2014   [facility]  . TOE SURGERY Right 2007   Family History:  Family History  Problem Relation Age of Onset  . Diabetes Mother   . Diabetes Maternal Uncle   . Diabetes Maternal Grandmother   . Heart disease Maternal Grandmother    Family Psychiatric  History: none reported Social History:  Social History   Substance and Sexual Activity  Alcohol Use No     Social History   Substance and Sexual Activity  Drug Use No    Social History   Socioeconomic History  . Marital status: Married    Spouse name: None  . Number of children: None  . Years of education: None  . Highest education level: None  Social Needs  . Financial resource strain: None  . Food insecurity - worry: None  . Food insecurity - inability: None  . Transportation needs - medical: None  . Transportation needs - non-medical:  None  Occupational History  . None  Tobacco Use  . Smoking status: Never Smoker  . Smokeless tobacco: Never Used  Substance and Sexual Activity  . Alcohol use: No  . Drug use: No  . Sexual activity: None  Other Topics Concern  . None  Social History Narrative  . None   Additional Social History:    Pain Medications: Vicodin Prescriptions: Vicodin, Klonopin History of alcohol / drug  use?: Yes Longest period of sobriety (when/how long): Unknown Negative Consequences of Use: Personal relationships, Financial Withdrawal Symptoms: (none) Name of Substance 1: marijuana 1 - Age of First Use: unknown 1 - Amount (size/oz): bowl 1 - Frequency: multiple times daily 1 - Duration: unknown 1 - Last Use / Amount: "a few days ago"                  Sleep: Fair  Appetite:  Fair  Current Medications: Current Facility-Administered Medications  Medication Dose Route Frequency Provider Last Rate Last Dose  . acetaminophen (TYLENOL) tablet 650 mg  650 mg Oral Q6H PRN Clapacs, Madie Reno, MD   650 mg at 05/12/17 1155  . alum & mag hydroxide-simeth (MAALOX/MYLANTA) 200-200-20 MG/5ML suspension 30 mL  30 mL Oral Q4H PRN Clapacs, Madie Reno, MD   30 mL at 05/11/17 2337  . clonazePAM (KLONOPIN) tablet 1 mg  1 mg Oral TID [name], Tyson Babinski, MD   1 mg at 05/12/17 1155  . haloperidol (HALDOL) tablet 10 mg  10 mg Oral Q6H PRN Rohaan Durnil B, MD   10 mg at 05/12/17 0907   Or  . haloperidol lactate (HALDOL) injection 10 mg  10 mg Intramuscular Q6H PRN Airam Runions B, MD      . HYDROcodone-acetaminophen (NORCO) 7.5-325 MG per tablet 1 tablet  1 tablet Oral Q6H PRN Clapacs, Madie Reno, MD   1 tablet at 05/10/17 0231  . hydrOXYzine (ATARAX/VISTARIL) tablet 50 mg  50 mg Oral TID PRN Clapacs, Madie Reno, MD   50 mg at 05/10/17 2144  . LORazepam (ATIVAN) tablet 2 mg  2 mg Oral Q6H PRN Stony Stegmann B, MD   2 mg at 05/12/17 0907   Or  . LORazepam (ATIVAN) injection 2 mg  2 mg Intramuscular Q6H PRN Jash Wahlen B, MD      . magnesium hydroxide (MILK OF MAGNESIA) suspension 30 mL  30 mL Oral Daily PRN Clapacs, [name], MD   30 mL at 05/12/17 1155  . neomycin-bacitracin-polymyxin (NEOSPORIN) ointment   Topical PRN Lenward Chancellor, MD      . OLANZapine [facility]) tablet 5 mg  5 mg Oral BID Lenward Chancellor, MD   5 mg at 05/12/17 0900  . polyethylene glycol (MIRALAX / GLYCOLAX) packet 17 g   17 g Oral Daily Lenward Chancellor, MD   17 g at 05/12/17 0900  . thiamine (VITAMIN B-1) tablet 100 mg  100 mg Oral Daily Clapacs, [name], MD   100 mg at 05/12/17 0900   Or  . thiamine (B-1) injection 100 mg  100 mg Intravenous Daily Clapacs, [name], MD        Lab Results: No results found for this or any previous visit (from the past 48 hour(s)).  Blood Alcohol level:  Lab Results  Component Value Date   ETH <10 93/90/3009    Metabolic Disorder Labs: Lab Results  Component Value Date   HGBA1C 4.8 05/10/2017   MPG 91.06 05/10/2017   No results found for: PROLACTIN Lab Results  Component Value Date   CHOL 162 05/10/2017   TRIG 70 05/10/2017   HDL 38 (L) 05/10/2017   CHOLHDL 4.3 05/10/2017   VLDL 14 05/10/2017   LDLCALC 110 (H) 05/10/2017   LDLCALC 112 (H) 11/13/2015    Physical Findings: AIMS:  , ,  ,  ,    CIWA:  CIWA-Ar Total: 0 COWS:  COWS Total Score: 0  Musculoskeletal: Strength & Muscle Tone: within normal limits Gait & Station: normal Patient leans: N/A  Psychiatric Specialty Exam: Physical Exam  Nursing note and vitals reviewed. Psychiatric: His speech is normal. His affect is labile. He is aggressive. Thought content is paranoid and delusional. Cognition and memory are impaired. He expresses impulsivity.    Review of Systems  Musculoskeletal: Positive for joint pain.  Skin:       Cut to the right thumb  Neurological: Positive for dizziness.  Psychiatric/Behavioral: Positive for hallucinations.  All other systems reviewed and are negative.   Blood pressure 112/70, pulse 97, temperature 99.1 F (37.3 C), temperature source Oral, resp. rate 18, height _0  (1.753 m), weight 83 kg (183 lb), SpO2 100 %.Body mass index is 27.02 kg/m.  General Appearance: Casual  Eye Contact:  Good  Speech:  Clear and Coherent  Volume:  Normal  Mood:  Anxious  Affect:  Congruent  Thought Process:  Disorganized and Descriptions of Associations: Tangential  Orientation:   Full (Time, Place, and Person)  Thought Content:  Delusions and Paranoid Ideation  Suicidal Thoughts:  No  Homicidal Thoughts:  No  Memory:  Immediate;   Fair Recent;   Fair Remote;   Fair  Judgement:  Poor  Insight:  Lacking  Psychomotor Activity:  Decreased  Concentration:  Concentration: Fair and Attention Span: Fair  Recall:  [employer] of Knowledge:  Fair  Language:  Fair  Akathisia:  No  Handed:  Right  AIMS (if indicated):     Assets:  Communication Skills Desire for Improvement Resilience Social Support  ADL's:  Intact  Cognition:  WNL  Sleep:  Number of Hours: 6     Treatment Plan Summary: Daily contact with patient to assess and evaluate symptoms and progress in treatment and Medication management    Mr. David Reeves is a 47 year old male with a history of anxiety,depression,substance use, and chronic pain on prescribed opioids and benzodiazepines presents psychotic, paranoid, delusional and aggressive.  #Aggressive behavior, transferred to "back hall" -Haldol 10 mg and Ativan 2 mg PRN -reportedly got agitated from Benadryl -1:1 security and sitter  #Psychosis -Zyprexa 5 mg BID -Clonazepam 1 mg TID as in the community  #Pain -Norco 7.5 mg PRN as in the community  #Laceration of the right thumb.   -X-rays negative -Dr. Burt Knack placed Steri-Strips on Saturday, patient reinjured it last night -Neosporin  #Metabolic syndrome monitoring -Lipid panel, TSH and HgbA1C are normal -EKG, QTc 393  #Substance abuse -positive for cannabis  #Disposition -discharge to home with family -follow up with [facility]   Orson Slick, MD 05/12/2017, 2:30 PM

## 2017-05-12 NOTE — Progress Notes (Signed)
D: Pt calm at this time. Tolerated lunch well. C/O of backache 6/10 when assessed this afternoon. Pt's mood remains labile / restless however, he's verbally redirectable thus far. A: Scheduled Klonopin and PRN Tylenol (pain) given as ordered. Assigned 1:1 [facility] staff in attendance at arms length. No self harm gestures to note at present.  R: POC continues for mood stability and safety.

## 2017-05-12 NOTE — Progress Notes (Signed)
Pt woke up and was very paranoid and agitated.Pt was taken his furniture out the room and placing it in hallway. Pt demanded to speak to his lawyer not. Pt verbalize that "he was going to break the glass in his room".  MD Subedi was called and benadryl 50 mg IM was given along with mg of Zyprexa 5 mg IM. Pt was offer PRN and was very confused and demanded to speak to a police officer. Police officer came and spoke with patient. Other teammate came for show of force.  . Patient then requested his list of his medication. Pt was given a list of his medication. Pt was explained his medication. Pt asked to see package he express "that we are trying to kill him". Pt was shown package and still refuse. Pt was assisted to the bed. Pt laided on the bed and both injection was given in the right deltoid. Pt was given some juice and water. Pt asked for a snack since he didn't received one earlier. Pt was given a fruit snack. Will cont to monitor pt. Pt remains on Q 15 mins safety rounds.

## 2017-05-12 NOTE — Plan of Care (Signed)
  Progressing Pain Managment: General experience of comfort will improve 05/12/2017 2151 - Progressing by Galen ManilaVigil, Nolin Grell E, RN Education: Emotional status will improve 05/12/2017 2151 - Progressing by Galen ManilaVigil, Randol Zumstein E, RN Mental status will improve 05/12/2017 2151 - Progressing by Galen ManilaVigil, Annalisa Colonna E, RN Coping: Ability to demonstrate self-control will improve 05/12/2017 2151 - Progressing by Galen ManilaVigil, Aydn Ferrara E, RN Education: Will be free of psychotic symptoms 05/12/2017 2151 - Progressing by Galen ManilaVigil, Damian Buckles E, RN

## 2017-05-12 NOTE — [facility] Suicide Risk Assessment (Signed)
[facility] INPATIENT:  Family/Significant Other Suicide Prevention Education  Suicide Prevention Education:  Education Completed; Charleston Hankin, pt's wife, at [phone]  has been identified by the patient as the family member/significant other with whom the patient will be residing, and identified as the person(s) who will aid the patient in the event of a mental health crisis (suicidal ideations/suicide attempt).  With written consent from the patient, the family member/significant other has been provided the following suicide prevention education, prior to the and/or following the discharge of the patient.  The suicide prevention education provided includes the following:  Suicide risk factors  Suicide prevention and interventions  National Suicide Hotline telephone number  [facility] assessment telephone number  [facility] Emergency Assistance 911  [facility] and/or Residential Mobile Crisis Unit telephone number  Request made of family/significant other to:  Remove weapons (e.g., guns, rifles, knives), all items previously/currently identified as safety concern.    Remove drugs/medications (over-the-counter, prescriptions, illicit drugs), all items previously/currently identified as a safety concern.  The family member/significant other verbalizes understanding of the suicide prevention education information provided.  The family member/significant other agrees to remove the items of safety concern listed above.  Pt's wife added, "him thinking that people were breaking into the house has been going on for about two years. I've put up security cameras to try and make him feel safer. I don't think it really ever went away, I think he just suppressed it. He also thinks that everyone is out to get him. At the time, it was the neighbors watching [place] and the feds. Then, a couple months ago, he said the government was controlling his TV and his phone. Then, within the last  few weeks, he took my daughter's phone from her and he thought there was evidence or that she was communicating with somebody. He thought that her friends were the government watching him or the police. He said her friends weren't really her friends and that they were there to watch [place]. He thought the government had put chips into our dogs, and begged me to take them to the vet to get chips taken out of them. He thinks that my daughter and I were trying to poison him and that my daughter was raped by our neighbors. He would get very agitated when I did not agree with him about all of these paranoid thoughts. He started getting really aggressive the past two weeks. But, he's very easily agitated naturally, but it intensified. 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 Those are the four that I know he takes. His doctor at [facility] prescribes these meds."   At baseline, "He doesn't go out often. We'll watch TV together. He sits at home all day because of his back so he dwells on everything. He doesn't have any hobbies or anything. He cooks supper and breakfast. We have a 82 year old daughter, and he'll watch TV with her too. He's not usually aggressive. He gets agitated easily but isn't usually punching things. He's never punched the walls in our house or anything like that. He doesn't throw things."   Since admission, "I came to see him last night and he was still saying crazy things. He said that every time y'all close the door up there that you're killing someone. He's just not himself at all."  CSW confirmed  that pt is able to return home upon discharge, and will not have access to weapons or other means of self harm. CSW will continue to coordinate with pt's wife as needed for updates and discharge planning.   Heidi DachKelsey Charletta Voight, LCSW 05/12/2017, 11:46  AM

## 2017-05-12 NOTE — Progress Notes (Signed)
Recreation Therapy Notes  Date: 01.21.2019  Time: 9:30 am  Location: Craft Room  Behavioral response: N/A  Intervention Topic: Goals  Discussion/Intervention: Patient did not attend group. Clinical Observations/Feedback:  Patient did not attend group. Amiera Herzberg LRT/CTRS         Remas Sobel 05/12/2017 10:30 AM

## 2017-05-12 NOTE — Progress Notes (Signed)
D: Pt A & O X3. Denies SI, HI, AVH and pain at this time. Irritable, restless /fidgety when engaged. Per pt "I want to see my daughter, shit, I need to get the fuck out of here". Verbally redirectable. Moved to room 13 due to report of escalating aggressive behavior from previous shift. A: Scheduled and PRN (See emar--Haldol & Ativan) medications given as per MD's orders with verbal education and effects monitored. Emotional support and availability provided to pt. Encouraged pt to voice concerns and comply with current treatment regimen. Assigned 1:1 staff in attendance at all times.  R: Pt remains paranoid / suspicious in his interactions and thoughts process. Demands to see pill packets and open them up himself "I don't trust what you give me, I need to see it myself". Took his medications with some encouragement after letting him opened them up. Tolerates all PO intake well.  Remains safe on unit.

## 2017-05-12 NOTE — Plan of Care (Signed)
Pt has been calm and cooperative. Pt is medication compliant. Pt denies pain at this time. Pt denies AH/VH/SI/HI at this time. Pt verbalized that "he feels better than yesterday". Pt displayed signs of paranoia by putting mattress on floor and stating that the room is contaminated. Pt stated, "he really can't remember what happen but he felt abnormal yesterday". Pt expressed sorry for his action he knew it want him. Pt stated, he is anxious and constipated. [facility]. Pt remains on Q 15 mins safety rounds. Will cont to monitor pt.   Progressing Pain Managment: General experience of comfort will improve 05/12/2017 0007 - Progressing by Merlene PullingBrigman, Sagal Gayton A, RN Education: Knowledge of [place] [facility] Education information/materials will improve 05/12/2017 0007 - Progressing by Merlene PullingBrigman, [place] Dohmen A, RN Emotional status will improve 05/12/2017 0007 - Progressing by Merlene PullingBrigman, [place] Inthavong A, RN Mental status will improve 05/12/2017 0007 - Progressing by Merlene PullingBrigman, Marilea Gwynne A, RN Verbalization of understanding the information provided will improve 05/12/2017 0007 - Progressing by Merlene PullingBrigman, Juaquina Machnik A, RN Coping: Ability to demonstrate self-control will improve 05/12/2017 0007 - Progressing by Merlene PullingBrigman, Makalya Nave A, RN Health Behavior/Discharge Planning: Identification of resources available to assist in meeting health care needs will improve 05/12/2017 0007 - Progressing by Rayne DuBrigman, [name] A, RN Compliance with treatment plan for underlying cause of condition will improve 05/12/2017 0007 - Progressing by Merlene PullingBrigman, Caillou Minus A, RN Education: Will be free of psychotic symptoms 05/12/2017 0007 - Progressing by Merlene PullingBrigman, Olanda Downie A, RN Nutritional: Ability to achieve adequate nutritional intake will improve 05/12/2017 0007 - Progressing by Merlene PullingBrigman, Meighan Treto A, RN Safety: Ability to redirect hostility and anger into socially appropriate behaviors will improve 05/12/2017 0007 - Progressing by Merlene PullingBrigman, Berdene Askari A, RN Ability to remain  free from injury will improve 05/12/2017 0007 - Progressing by Merlene PullingBrigman, Chemere Steffler A, RN Self-Care: Ability to participate in self-care as condition permits will improve 05/12/2017 0007 - Progressing by Merlene PullingBrigman, Mitra Duling A, RN

## 2017-05-12 NOTE — Tx Team (Signed)
Interdisciplinary Treatment and Diagnostic Plan Update  05/12/2017 Time of Session: [address] David Reeves MRN: [id number]  Principal Diagnosis: Brief psychotic disorder [facility])  Secondary Diagnoses: Principal Problem:   Brief psychotic disorder (HCC) Active Problems:   Chronic 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   Cannabis use disorder, moderate, dependence (HCC)   Laceration of right hand without foreign body   Current Medications:  Current Facility-Administered Medications  Medication Dose Route Frequency Provider Last Rate Last Dose  . acetaminophen (TYLENOL) tablet 650 mg  650 mg Oral Q6H PRN Clapacs, [name], MD      . alum & mag hydroxide-simeth (MAALOX/MYLANTA) 200-200-20 MG/5ML suspension 30 mL  30 mL Oral Q4H PRN Clapacs, [name], MD   30 mL at 05/11/17 2337  . clonazePAM (KLONOPIN) tablet 1 mg  1 mg Oral TID [name], Holly R, MD   1 mg at 05/12/17 0900  . haloperidol (HALDOL) tablet 10 mg  10 mg Oral Q6H PRN Pucilowska, Jolanta B, MD   10 mg at 05/12/17 0907   Or  . haloperidol lactate (HALDOL) injection 10 mg  10 mg Intramuscular Q6H PRN Pucilowska, Jolanta B, MD      . HYDROcodone-acetaminophen (NORCO) 7.5-325 MG per tablet 1 tablet  1 tablet Oral Q6H PRN Clapacs, [name], MD   1 tablet at 05/10/17 0231  . hydrOXYzine (ATARAX/VISTARIL) tablet 50 mg  50 mg Oral TID PRN Clapacs, [name], MD   50 mg at 05/10/17 2144  . LORazepam (ATIVAN) tablet 2 mg  2 mg Oral Q6H PRN Pucilowska, Jolanta B, MD   2 mg at 05/12/17 0907   Or  . LORazepam (ATIVAN) injection 2 mg  2 mg Intramuscular Q6H PRN Pucilowska, Jolanta B, MD      . magnesium hydroxide (MILK OF MAGNESIA) suspension 30 mL  30 mL Oral Daily PRN Clapacs, [name], MD      . neomycin-bacitracin-polymyxin (NEOSPORIN) ointment   Topical PRN [name], MD      . OLANZapine (ZYPREXA) tablet 5 mg  5 mg Oral BID [name], MD   5 mg at 05/12/17 0900  . polyethylene glycol (MIRALAX / GLYCOLAX) packet 17 g  17 g  Oral Daily [name], MD   17 g at 05/12/17 0900  . thiamine (VITAMIN B-1) tablet 100 mg  100 mg Oral Daily Clapacs, [name], MD   100 mg at 05/12/17 0900   Or  . thiamine (B-1) injection 100 mg  100 mg Intravenous Daily Clapacs, [name], MD       PTA Medications: Medications Prior to Admission  Medication Sig Dispense Refill Last Dose  . Calcium Carb-Cholecalciferol (CALCIUM+D3) 600-800 MG-UNIT TABS Take by mouth.   N/A at N/A  . celecoxib (CELEBREX) 200 MG capsule Take 1 capsule by mouth  twice daily as needed 180 capsule 2 N/A at N/A  . Cholecalciferol (VITAMIN D3) 2000 units TABS Take by mouth.   N/A at N/A  . clonazePAM (KLONOPIN) 1 MG tablet Take 2 tablets in the morning, 1 afternoon and 1 at bedtime by mouth as needed for anxiety/panic. 100 tablet 3 N/A at N/A  . Glucosamine-Chondroit-Vit C-Mn (GLUCOSAMINE CHONDR 1500 COMPLX PO) Take by mouth.   N/A at N/A  . HYDROcodone-acetaminophen (NORCO) 7.5-325 MG tablet Take 1 tablet by mouth 3 (three) times daily as needed. 90 tablet 0 N/A at N/A  . Multiple Vitamin (MULTIVITAMIN) tablet Take 1 tablet by mouth daily.   N/A at N/A  . Probiotic  Product (PROBIOTIC COLON SUPPORT PO) Take by mouth.   N/A at N/A  . tiZANidine (ZANAFLEX) 4 MG tablet TAKE 1 TABLET BY MOUTH 3  TIMES DAILY 270 tablet 1 N/A at N/A    Patient Stressors: Substance abuse Other: paranoia and delusional thoughts  Patient Strengths: Average or above average intelligence Capable of independent living Communication skills General fund of knowledge Supportive family/friends  Treatment Modalities: Medication Management, Group therapy, Case management,  1 to 1 session with clinician, Psychoeducation, Recreational therapy.   Physician Treatment Plan for Primary Diagnosis: Brief psychotic disorder (HCC) Long Term Goal(s): Improvement in symptoms so as ready for discharge Improvement in symptoms so as ready for discharge   Short Term Goals: Ability to identify changes  in lifestyle to reduce recurrence of condition will improve Ability to verbalize feelings will improve Ability to disclose and discuss suicidal ideas Ability to demonstrate self-control will improve Ability to identify and develop effective coping behaviors will improve Ability to maintain clinical measurements within normal limits will improve Compliance with prescribed medications will improve Ability to identify triggers associated with substance abuse/mental health issues will improve Ability to identify changes in lifestyle to reduce recurrence of condition will improve Ability to verbalize feelings will improve Ability to disclose and discuss suicidal ideas Ability to demonstrate self-control will improve Ability to identify and develop effective coping behaviors will improve Ability to maintain clinical measurements within normal limits will improve Compliance with prescribed medications will improve Ability to identify triggers associated with substance abuse/mental health issues will improve  Medication Management: Evaluate patient's response, side effects, and tolerance of medication regimen.  Therapeutic Interventions: 1 to 1 sessions, Unit Group sessions and Medication administration.  Evaluation of Outcomes: Not Progressing  Physician Treatment Plan for Secondary Diagnosis: Principal Problem:   Brief psychotic disorder (HCC) Active Problems:   Chronic 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   Cannabis use disorder, moderate, dependence (HCC)   Laceration of right hand without foreign body  Long Term Goal(s): Improvement in symptoms so as ready for discharge Improvement in symptoms so as ready for discharge   Short Term Goals: Ability to identify changes in lifestyle to reduce recurrence of condition will improve Ability to verbalize feelings will improve Ability to disclose and discuss suicidal ideas Ability to demonstrate self-control will improve Ability  to identify and develop effective coping behaviors will improve Ability to maintain clinical measurements within normal limits will improve Compliance with prescribed medications will improve Ability to identify triggers associated with substance abuse/mental health issues will improve Ability to identify changes in lifestyle to reduce recurrence of condition will improve Ability to verbalize feelings will improve Ability to disclose and discuss suicidal ideas Ability to demonstrate self-control will improve Ability to identify and develop effective coping behaviors will improve Ability to maintain clinical measurements within normal limits will improve Compliance with prescribed medications will improve Ability to identify triggers associated with substance abuse/mental health issues will improve     Medication Management: Evaluate patient's response, side effects, and tolerance of medication regimen.  Therapeutic Interventions: 1 to 1 sessions, Unit Group sessions and Medication administration.  Evaluation of Outcomes: Not Progressing   RN Treatment Plan for Primary Diagnosis: Brief psychotic disorder (HCC) Long Term Goal(s): Knowledge of disease and therapeutic regimen to maintain health will improve  Short Term Goals: Ability to verbalize feelings will improve, Ability to identify and develop effective coping behaviors will improve and Compliance with prescribed medications will improve  Medication Management: RN will administer medications  as ordered by provider, will assess and evaluate patient's response and provide education to patient for prescribed medication. RN will report any adverse and/or side effects to prescribing provider.  Therapeutic Interventions: 1 on 1 counseling sessions, Psychoeducation, Medication administration, Evaluate responses to treatment, Monitor vital signs and CBGs as ordered, Perform/monitor CIWA, COWS, AIMS and Fall Risk screenings as ordered, Perform  wound care treatments as ordered.  Evaluation of Outcomes: Not Progressing   LCSW Treatment Plan for Primary Diagnosis: Brief psychotic disorder [facility](HCC) Long Term Goal(s): Safe transition to appropriate next level of care at discharge, Engage patient in therapeutic group addressing interpersonal concerns.  Short Term Goals: Engage patient in aftercare planning with referrals and resources, Increase ability to appropriately verbalize feelings and Increase emotional regulation  Therapeutic Interventions: Assess for all discharge needs, 1 to 1 time with Social worker, Explore available resources and support systems, Assess for adequacy in community support network, Educate family and significant other(s) on suicide prevention, Complete Psychosocial Assessment, Interpersonal group therapy.  Evaluation of Outcomes: Not Progressing   Progress in Treatment: Attending groups: No. Participating in groups: No. Taking medication as prescribed: Yes. Toleration medication: Yes. Family/Significant other contact made: No, will contact:  pt declined to sign ROI for his family/supports. Patient understands diagnosis: Yes. Discussing patient identified problems/goals with staff: Yes. Medical problems stabilized or resolved: Yes. Denies suicidal/homicidal ideation: Yes. Issues/concerns per patient self-inventory: No. Other: None at this time.   New problem(s) identified: No, Describe:  None at this time.  New Short Term/Long Term Goal(s): Pt reported his goal for treatment is to, "go home and be with my family."   Discharge Plan or Barriers: CSW will continue to assess for an appropriate discharge plan. At this time, pt is not able to engage appropriately to participate in discharge planning discussions.   Reason for Continuation of Hospitalization: Aggression Delusions  Medication stabilization  Estimated Length of Stay: 5-7 days   Recreational Therapy: Patient Stressors: N/A Patient  Goal: Patient will attend group in a calm and appropriate manner with no impulsive behaviors x5 days.  Attendees: Patient: David Reeves 05/12/2017 11:20 AM  Physician: Dr. Jennet MaduroPucilowska, MD 05/12/2017 11:20 AM  Nursing: Leonia ReaderPhyllis Cobb, RN 05/12/2017 11:20 AM  RN Care Manager: 05/12/2017 11:20 AM  Social Worker: Heidi DachKelsey Craig, LCSW 05/12/2017 11:20 AM  Recreational Therapist: Garret ReddishShay Lekeya Rollings, CTRS-LRT 05/12/2017 11:20 AM  Other: Jake SharkSara Laws, LCSW 05/12/2017 11:20 AM  Other: Johny ShearsCassandra Jarrett, LCSWA 05/12/2017 11:20 AM  Other: 05/12/2017 11:20 AM    Scribe for Treatment Team: Heidi DachKelsey Craig, LCSW 05/12/2017 11:20 AM

## 2017-05-12 NOTE — Progress Notes (Signed)
D: Pt awake observed on phone in hall at this time.  Agitated and verbally abusive when told to wait before he showered as he was complaining of "my legs are getting weak, I can't stand too long". Pt ran into the shower and turned the water on "shit, fuck, I want to still shower now, I mean now". Abruptly came out of the shower, that shit is not warm enough, that's all I can do". Banged aggressively on the faucet multiple times. Declined PRN Haldol and Ativan when offered, stated "you opened it by the window, I don't want it".  A: Scheduled and PRN medications given this evening as scheduled and pt was compliant. Mag Citrate 1 bottle X1 given as ordered due to no result from MOM earlier this shift. Assigned 1:1 MHT staff at bedside at all times.  R: Pt calm at present, stated "the medication helped me". Tolerated dinner well. Awaiting visitation from family. Safety maintained on unit.

## 2017-05-12 NOTE — [facility] Group Notes (Signed)
[facility] Group Notes:  (Nursing/MHT/Case Management/Adjunct)  Date:  05/12/2017  Time:  3:08 PM  Type of Therapy:  Psychoeducational Skills  Participation Level:  Did Not Attend   Lynelle SmokeCara Travis [facility] 05/12/2017, 3:08 PM
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
## 2017-05-12 NOTE — [facility] Group Notes (Signed)
05/12/2017  Time: 1:00PM   Type of Therapy and Topic:  Group Therapy:  Overcoming Obstacles   Participation Level:  Did Not Attend   Description of Group:   In this group patients will be encouraged to explore what they see as obstacles to their own wellness and recovery. They will be guided to discuss their thoughts, feelings, and behaviors related to these obstacles. The group will process together ways to cope with barriers, with attention given to specific choices patients can make. Each patient will be challenged to identify changes they are motivated to make in order to overcome their obstacles. This group will be process-oriented, with patients participating in exploration of their own experiences, giving and receiving support, and processing challenge from other group members.   Therapeutic Goals: 1. Patient will identify personal and current obstacles as they relate to admission. 2. Patient will identify barriers that currently interfere with their wellness or overcoming obstacles.  3. Patient will identify feelings, thought process and behaviors related to these barriers. 4. Patient will identify two changes they are willing to make to overcome these obstacles:      Summary of Patient Progress  Pt was invited to attend group but chose not to attend. CSW will continue to encourage pt to attend group throughout their admission.     Therapeutic Modalities:   Cognitive Behavioral Therapy Solution Focused Therapy Motivational Interviewing Relapse Prevention Therapy  Heidi DachKelsey Travell Desaulniers, MSW, LCSW 05/12/2017 1:45 PM

## 2017-05-12 NOTE — Progress Notes (Signed)
Recreation Therapy Notes  INPATIENT RECREATION THERAPY ASSESSMENT  Patient Details Name: David Reeves MRN: [id number] DOB: 03/17/1971 Today's Date: 05/12/2017  Patient was sleep and could not be waken for assessment.   Patient Stressors:    Coping Skills:      Engineer, building servicesersonal Challenges:    Leisure Interests (2+):     Awareness of Community Resources:     [facility] Resources:     Current Use:    If no, Barriers?:    Patient Strengths:     Patient Identified Areas of Improvement:     Current Recreation Participation:     Patient Goal for Hospitalization:     [place]      Current SI (including self-harm):     Current HI:     Consent to Intern Participation:     David Reeves 05/12/2017, 3:49 PM

## 2017-05-13 ENCOUNTER — Inpatient Hospital Stay: Payer: Managed Care, Other (non HMO)

## 2017-05-13 MED ORDER — OLANZAPINE 5 MG PO TABS
5.0000 mg | ORAL_TABLET | ORAL | Status: DC
  Administered 2017-05-14 – 2017-05-16 (×3): 5 mg via ORAL
  Filled 2017-05-13 (×3): qty 1

## 2017-05-13 MED ORDER — BACITRACIN-NEOMYCIN-POLYMYXIN 400-5-5000 EX OINT
TOPICAL_OINTMENT | Freq: Every day | CUTANEOUS | Status: DC
  Administered 2017-05-14 – 2017-05-22 (×4): 1 via TOPICAL
  Filled 2017-05-13: qty 1
  Filled 2017-05-13: qty 2
  Filled 2017-05-13 (×4): qty 1

## 2017-05-13 MED ORDER — PALIPERIDONE ER 3 MG PO TB24: 6.0000 mg | ORAL_TABLET | Freq: Every day | ORAL | Status: DC

## 2017-05-13 MED ORDER — OLANZAPINE 10 MG PO TABS
10.0000 mg | ORAL_TABLET | Freq: Every day | ORAL | Status: DC
  Administered 2017-05-13 – 2017-05-15 (×3): 10 mg via ORAL
  Filled 2017-05-13 (×3): qty 1

## 2017-05-13 MED ORDER — OLANZAPINE 10 MG PO TABS: 10.0000 mg | ORAL_TABLET | Freq: Every day | ORAL | Status: DC

## 2017-05-13 MED ORDER — OLANZAPINE 5 MG PO TABS: 5.0000 mg | ORAL_TABLET | ORAL | Status: DC
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
## 2017-05-13 NOTE — [facility] Group Notes (Signed)
[facility] Group Notes:  (Nursing/MHT/Case Management/Adjunct)  Date:  05/13/2017  Time:  11:17 PM  Type of Therapy:  Group Therapy  Participation Level:  Did Not Attend  Participation Qualit

## 2017-05-13 NOTE — Progress Notes (Signed)
Patient is unpredictable, labile,paranoid, and delusional, but for the present time, redirectable, and no acting out noted. Patient refused medications at 0800am, states " I am only taking medicine for doctor,and I still have blockage in my stomach, nurse ask him if he had bowel movement and did He need His miralex, and he states " no that stuff does not help me, you really don't understand. Nurse told him if he needed anything to let staff know and that Doctor would talk to him a some point today. Patient did come to medication room and ask for His medicine around 0915, more logical, and reasonable, told nurse that he did not want to hurt himself or anyone else, and that He still needed to talk to medical doctor about his blockage. Patient is fixated on His bowels. Nurse administered medications and He had to read label on package before nurse opened and then He swallowed pills. Patient is ambulating around unit and has no behavioral issues at this time. q 15 minute checks and camera surveillance for safety.

## 2017-05-13 NOTE — Progress Notes (Signed)
Recreation Therapy Notes  Date: 01.22.2019  Time: 9:30 am  Location: Craft Room  Behavioral response: N/A   Intervention Topic: Creative Expressions  Discussion/Intervention: Patient did not attend group. Clinical Observations/Feedback:  Patient did not attend group.  [name] LRT/CTRS          David Reeves 05/13/2017 10:41 AM 

## 2017-05-13 NOTE — [facility] Group Notes (Signed)
[facility] Group Notes:  (Nursing/MHT/Case Management/Adjunct)  Date:  05/13/2017  Time:  11:16 PM  Type of Therapy:  Group Therapy  Participation Level:  Did Not Attend  Participation QualitModes of Intervention:   Mayra NeerJackie L Orien Mayhall 05/13/2017, 11:16 PM
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
## 2017-05-13 NOTE — Progress Notes (Addendum)
[facility] MD Progress Note  05/13/2017 10:17 AM David Reeves  MRN:  [id number] Subjective:  Per RN review, pt was agitated last night and banging aggressively on facet. He was very paranoid that medications were being tapered with. On evaluation today, he was very paranoid toward this provider. He states, "how do I know you are a real doctor? Something is not right here." He is very anxious and feels like he is having a stroke. "He states "My tongue is numb. I'm having a stroke. I think there is something laced with fentanyl." On exam, no evidence of stroke symptoms. He has  Piece of paper of things he wants to talk about. He states, "I need transferred to another hospital. [facility] in [place] because people are trying to kill me here." He states, "[facility] is an [facility]." He states, "there is fentanyl in the air here." He is perseverative on fentanyl being laced with his medications. He states, "I need an xray to see about this surgery that they put something in my rectum." Pt is disorganized and paranoia.   I spoke with his wife, Ferdinand Cava. We reviewed that history that she gave to CSW. She stats that he has been paranoid for about 2 years. She states that they were very close to splitting up las summer because he wanted to power cut off due to paranoia. She took her daughter on vacation and when they came back he was better but then gradually got paranoid again. She states that he has been paranoid about people breaking in the house but recently has been much worse. He thinks that the dogs have chips implanted in them and he wanted them to go tot he vet to get it checked. He is also talking about something that was implanted in his rectum. She states that he has been much more agitated recently. She states that he has no history of psychosis.He only saw a psychiatrist once. He was inpatient once at age 32 after a suicide attempt by drinking rubbing alcohol. She states that he did not  grow up with his father which was hard on him. His grandfather passed away when patient was age 64 and that was really hard on him.He has chronic pain due to arthritis and fracture in his spine. He has osteopenia.  She states that pt is very paranoid in the hospital and he called her this morning saying that the hospital is poisoning him.   Principal Problem: Brief psychotic disorder [facility]) Diagnosis:   Patient Active Problem List   Diagnosis Date Noted  . Laceration of right hand without foreign body [S61.411A]   . 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 Cannabis use disorder, moderate, dependence (HCC) [F12.20] 05/09/2017  . Closed compression fracture of L1 lumbar vertebra (HCC) [S32.010A] 05/24/2016  . Lumbar stenosis with neurogenic claudication [M48.062] 11/01/2015  . Chronic 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 [M54.41, G89.29] 11/08/2014  . Hx of hemorrhoids [Z87.19] 11/08/2014  . AAA (abdominal aortic aneurysm) (HCC) [I71.4] 09/22/2014  . Chronic pain associated with significant psychosocial dysfunction [G89.4] 09/22/2014  . Panic attack [F41.0] 09/22/2014  . AB (asthmatic bronchitis) [J45.909] 08/17/2014  . 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 Backache [M54.9] 08/17/2014  . Lumbosacral spondylosis without myelopathy [M47.817] 08/17/2014  . Disorder of male genital organs [N50.9] 08/17/2014  . Brash [R12] 08/17/2014  . Low back pain [M54.5] 08/17/2014  . Tendon nodule [M67.90] 08/17/2014  . Episodic paroxysmal anxiety disorder [  F41.0] 08/17/2014  . Hernia, inguinal, right [K40.90] 08/17/2014  . Fast heart beat [R00.0] 08/17/2014  . Illness [R69] 08/17/2014  . Inguinal hernia [K40.90] 10/14/2012   Total Time spent with patient: 20 minutes  Past Psychiatric History: See H&P  Past Medical History:  Past Medical History:  Diagnosis Date  . Back pain 2014  . Bone spur   . Bulging disc 2014  . Degenerative disc  disease   . Osteoarthritis   . Panic anxiety syndrome   . Taking multiple medications for chronic disease     Past Surgical History:  Procedure Laterality Date  . HERNIA REPAIR Left 2006,2014   [facility]  . TOE SURGERY Right 2007   Family History:  Family History  Problem Relation Age of Onset  . Diabetes Mother   . Diabetes Maternal Uncle   . Diabetes Maternal Grandmother   . Heart disease Maternal Grandmother    Family Psychiatric  History: See H&P Social History:  Social History   Substance and Sexual Activity  Alcohol Use No     Social History   Substance and Sexual Activity  Drug Use No    Social History   Socioeconomic History  . Marital status: Married    Spouse name: None  . Number of children: None  . Years of education: None  . Highest education level: None  Social Needs  . Financial resource strain: None  . Food insecurity - worry: None  . Food insecurity - inability: None  . Transportation needs - medical: None  . Transportation needs - non-medical: None  Occupational History  . None  Tobacco Use  . Smoking status: Never Smoker  . Smokeless tobacco: Never Used  Substance and Sexual Activity  . Alcohol use: No  . Drug use: No  . Sexual activity: None  Other Topics Concern  . None  Social History Narrative  . None   Additional Social History:    Pain Medications: Vicodin Prescriptions: Vicodin, Klonopin History of alcohol / drug use?: Yes Longest period of sobriety (when/how long): Unknown Negative Consequences of Use: Personal relationships, Financial Withdrawal Symptoms: (none) Name of Substance 1: marijuana 1 - Age of First Use: unknown 1 - Amount (size/oz): bowl 1 - Frequency: multiple times daily 1 - Duration: unknown 1 - Last Use / Amount: "a few days ago"                  Sleep: Poor  Appetite:  Poor  Current Medications: Current Facility-Administered Medications  Medication Dose Route Frequency Provider Last Rate  Last Dose  . acetaminophen (TYLENOL) tablet 650 mg  650 mg Oral Q6H PRN Clapacs, Jackquline DenmarkJohn T, MD   650 mg at 05/12/17 1155  . alum & mag hydroxide-simeth (MAALOX/MYLANTA) 200-200-20 MG/5ML suspension 30 mL  30 mL Oral Q4H PRN Clapacs, Jackquline DenmarkJohn T, MD   30 mL at 05/11/17 2337  . clonazePAM (KLONOPIN) tablet 1 mg  1 mg Oral TID [name], Ileene HutchinsonHolly R, MD   1 mg at 05/13/17 0906  . haloperidol (HALDOL) tablet 10 mg  10 mg Oral Q6H PRN Pucilowska, Jolanta B, MD   10 mg at 05/13/17 0147   Or  . haloperidol lactate (HALDOL) injection 10 mg  10 mg Intramuscular Q6H PRN Pucilowska, Jolanta B, MD      . HYDROcodone-acetaminophen (NORCO) 7.5-325 MG per tablet 1 tablet  1 tablet Oral Q6H PRN Clapacs, Jackquline DenmarkJohn T, MD   1 tablet at 05/12/17 2046  . hydrOXYzine (ATARAX/VISTARIL) tablet 50 mg  50 mg Oral TID PRN Clapacs, [name], MD   50 mg at 05/13/17 0146  . LORazepam (ATIVAN) tablet 2 mg  2 mg Oral Q6H PRN Pucilowska, Jolanta B, MD   2 mg at 05/13/17 0147   Or  . LORazepam (ATIVAN) injection 2 mg  2 mg Intramuscular Q6H PRN Pucilowska, Jolanta B, MD      . magnesium hydroxide (MILK OF MAGNESIA) suspension 30 mL  30 mL Oral Daily PRN Clapacs, [name], MD   30 mL at 05/12/17 1155  . neomycin-bacitracin-polymyxin (NEOSPORIN) ointment   Topical PRN [name], MD      . Melene Muller ON 05/14/2017] OLANZapine (ZYPREXA) tablet 5 mg  5 mg Oral BH-q7a [name], [name], MD       And  . OLANZapine (ZYPREXA) tablet 10 mg  10 mg Oral QHS [name], Holly R, MD      . polyethylene glycol (MIRALAX / GLYCOLAX) packet 17 g  17 g Oral Daily [name], MD   17 g at 05/12/17 0900    Lab Results: No results found for this or any previous visit (from the past 48 hour(s)).  Blood Alcohol level:  Lab Results  Component Value Date   ETH <10 05/08/2017    Metabolic Disorder Labs: Lab Results  Component Value Date   HGBA1C 4.8 05/10/2017   MPG 91.06 05/10/2017   No results found for: PROLACTIN Lab Results  Component Value Date   CHOL 162  05/10/2017   TRIG 70 05/10/2017   HDL 38 (L) 05/10/2017   CHOLHDL 4.3 05/10/2017   VLDL 14 05/10/2017   LDLCALC 110 (H) 05/10/2017   LDLCALC 112 (H) 11/13/2015    Physical Findings: AIMS:  , ,  ,  ,    CIWA:  CIWA-Ar Total: 0 COWS:  COWS Total Score: 0  Musculoskeletal: Strength & Muscle Tone: within normal limits Gait & Station: normal Patient leans: N/A  Psychiatric Specialty Exam: Physical Exam  Nursing note and vitals reviewed. CN intact, face symmetrical  Review of Systems  All other systems reviewed and are negative.   Blood pressure 132/76, pulse (!) 112, temperature 97.6 F (36.4 C), temperature source Oral, resp. rate 18, height 5\' 9"  (1.753 m), weight 83 kg (183 lb), SpO2 100 %.Body mass index is 27.02 kg/m.  General Appearance: Casual  Eye Contact:  Minimal  Speech:  Clear and Coherent  Volume:  Normal  Mood:  Depressed and Irritable  Affect:  Flat  Thought Process:  Disorganized  Orientation:  Full (Time, Place, and Person)  Thought Content:  Illogical and Delusions  Suicidal Thoughts:  No  Homicidal Thoughts:  No  Memory:  Immediate;   Poor  Judgement:  Impaired  Insight:  Lacking  Psychomotor Activity:  Increased  Concentration:  Concentration: Poor  Recall:  Poor  Fund of Knowledge:  Poor  Language:  Poor  Akathisia:  No      Assets:  Resilience Social Support  ADL's:  Intact  Cognition:  Impaired,  Mild  Sleep:  Number of Hours: 7.5     Treatment Plan Summary: 47 yo male admitted due to acute psychosis. Pt has no history of psychosis but has been more and more paranoid for the past 2 years and has acutely worsened over the past month. He is very paranoid in the hospital and feels that he is being poisoned here. His wife is very concerned for him. HE has been taking medications while int he hospital but with a lot  of reassurance.   Plan:  Psychosis -Increase Zyprexa to 5 mg qam and 10 mg qhs -prn Ativan and Haldol for agitation -Will  order CT head to rule out any organic causes of psychosis as he does not have history of psychosis  Anxiety -Continue Klonopin 1 mg TID which he is on as an outpatient  Chronic pain -Continue Percocet prn  Dispo -he will return home with his wife when stable   Haskell Riling, MD 05/13/2017, 10:17 AM

## 2017-05-13 NOTE — Progress Notes (Signed)
05/13/2017  Subjective: Surgery consulted on 1/20 for a right thumb laceration.  Steri strips were applied but patient ended up punching a wall on admission and steri strips came off.  Now replaced with band-aid.  Denies any issues with the thumb and denies any bleeding.  Vital signs: Temp:  [97.6 F (36.4 C)] 97.6 F (36.4 C) (01/22 0600) Pulse Rate:  [92-112] 112 (01/22 0600) Resp:  [18] 18 (01/22 0600) BP: (115-132)/(75-76) 132/76 (01/22 0600)   Intake/Output: 01/21 0701 - 01/22 0700 In: 960 [P.O.:960] Out: -  Last BM Date: 05/12/17  Physical Exam: Constitutional: No acute distress Skin:  Right thumb 3 cm laceration with viable skin flap.  No evidence of infection.    Labs:  No results for input(s): WBC, HGB, HCT, PLT in the last 72 hours. No results for input(s): NA, K, CL, CO2, GLUCOSE, BUN, CREATININE, CALCIUM in the last 72 hours.  Invalid input(s): MAGNESIUM No results for input(s): LABPROT, INR in the last 72 hours.  Imaging: Ct Head Wo Contrast  Result Date: 05/13/2017 CLINICAL DATA:  Patient is from Behavior [facility]. Pt is paranoid, and delusional, but for the present time, redirectable, and no acting out noted. Patient is fixated on his bowels. EXAM: CT HEAD WITHOUT CONTRAST TECHNIQUE: Contiguous axial images were obtained from the base of the skull through the vertex without intravenous contrast. COMPARISON:  None. FINDINGS: Brain: No evidence of acute infarction, hemorrhage, hydrocephalus, extra-axial collection or mass lesion/mass effect. Vascular: No hyperdense vessel or unexpected calcification. Skull: No osseous abnormality. Sinuses/Orbits: Visualized paranasal sinuses are clear. Visualized mastoid sinuses are clear. Visualized orbits demonstrate no focal abnormality. Other: None IMPRESSION: No acute intracranial pathology. Electronically Signed   By: Elige KoHetal  Patel   On: 05/13/2017 10:50    Assessment/Plan: 47 yo male with right thumb laceration.  --may apply  neosporin ointment over the laceration and cover with band-aid once daily. --no acute surgical needs.  Will sign off for now.  Please feel free to call or consult if any questions or concerns.   Howie IllJose Luis Maille Halliwell, MD [facility] ASC[facility] 7a-7p: [phone] ASCOM 7p-7a: [phone]

## 2017-05-14 DIAGNOSIS — F209 Schizophrenia, unspecified: Secondary | ICD-10-CM | POA: Diagnosis present

## 2017-05-14 MED ORDER — POTASSIUM CHLORIDE CRYS ER 20 MEQ PO TBCR
40.0000 meq | EXTENDED_RELEASE_TABLET | Freq: Once | ORAL | Status: AC
  Administered 2017-05-14: 40 meq via ORAL
  Filled 2017-05-14: qty 2

## 2017-05-14 MED ORDER — CLONAZEPAM 0.5 MG PO TABS
0.5000 mg | ORAL_TABLET | Freq: Three times a day (TID) | ORAL | Status: DC
  Administered 2017-05-14 – 2017-05-15 (×4): 0.5 mg via ORAL
  Filled 2017-05-14 (×4): qty 1

## 2017-05-14 NOTE — Progress Notes (Signed)
Recreation Therapy Notes  Date: 01.23 .2018  Time: 3:00pm  Location: Craft room  Behavioral response: N/A  Group Type: Craft  Participation level: N/A  Communication: Patient did not attend group.   Comments: N/A  Jessilyn Catino LRT/CTRS        Cherree Conerly 05/14/2017 4:54 PM 

## 2017-05-14 NOTE — Progress Notes (Signed)
Recreation Therapy Notes  Date: 01.23.2019  Time: 9:30 am  Location: Craft Room  Behavioral response: None  Intervention Topic: Team work  Discussion/Intervention: Patient did not attend group. Clinical Observations/Feedback:  Patient did not attend group.   [name] LRT/CTRS           [name] 05/14/2017 12:36 PM

## 2017-05-14 NOTE — Plan of Care (Signed)
Patient is progressing in all areas. 

## 2017-05-14 NOTE — Progress Notes (Signed)
Patient ID: David Reeves, male   DOB: October 23, 1970, 47 y.o.   MRN: [id number] Patient slept until almost 2230, c/o constipation, MOM given with the rest of the medications and wound care completed as ordered; "I need a CT scan of my abdomen, it feels like I have a foreign object in my stomach..." no temper tantrums, no aggression, no odd bizarre behavior; thoughts are still not as logical or organized, still paranoid.

## 2017-05-14 NOTE — [facility] Group Notes (Signed)
  05/14/2017  Time: 1:00PM  Type of Therapy/Topic:  Group Therapy:  Emotion Regulation  Participation Level:  Did Not Attend   Description of Group:    The purpose of this group is to assist patients in learning to regulate negative emotions and experience positive emotions. Patients will be guided to discuss ways in which they have been vulnerable to their negative emotions. These vulnerabilities will be juxtaposed with experiences of positive emotions or situations, and patients will be challenged to use positive emotions to combat negative ones. Special emphasis will be placed on coping with negative emotions in conflict situations, and patients will process healthy conflict resolution skills.  Therapeutic Goals: 1. Patient will identify two positive emotions or experiences to reflect on in order to balance out negative emotions 2. Patient will label two or more emotions that they find the most difficult to experience 3. Patient will demonstrate positive conflict resolution skills through discussion and/or role plays  Summary of Patient Progress: Pt was invited to attend group but chose not to attend. CSW will continue to encourage pt to attend group throughout their admission.     Therapeutic Modalities:   Cognitive Behavioral Therapy Feelings Identification Dialectical Behavioral Therapy  Heidi DachKelsey Cozette Braggs, MSW, LCSW 05/14/2017 1:48 PM

## 2017-05-14 NOTE — Plan of Care (Signed)
Patient slept for Estimated Hours of 5.45; Precautionary checks every 15 minutes for safety maintained, room free of safety hazards, patient sustains no injury or falls during this shift.  

## 2017-05-14 NOTE — Progress Notes (Signed)
[facility]  05/14/2017 11:58 AM David Reeves  MRN:  [id number] Subjective:  Pt is much less anxious and paranoid today. He states that he feels safer on the unit. He states that when he first came in he "knew some of the people were ill willed." He is much more organized in conversation. He still is delusional but they are much less prominent and only talks about them when prompted. He recalls feeling that people were breaking into his house and still feels this is the case. He feels that there is something in his daughters phone telling him that someone put something in his rectum. He is not sure what it is. He did not speak about these things until this provider brought it up. He also still feels his pets got a chip installed from the vet but he is not sure what kind of chip. He denies AH, VH. He denies SI. He is organized and able to give most of his past history. He denies any family history of psychosis, bipolar disorder, HD or PD. He states that he feels safer about the medications that he is given and does not think they are being tampered with. He still thinks that the marijuana that he was smoking was laced with fentanyl. HE states that he is never smoking again. Denies recent fevers or illnesses. HE does have osteopenia but everything has been normal during workup.   Principal Problem: Schizophrenia (HCC) Diagnosis:   Patient Active Problem List   Diagnosis Date Noted  . Schizophrenia (HCC) [F20.9] 05/14/2017    Priority: High  . Laceration of right hand without foreign body [S61.411A]   . Cannabis use disorder, moderate, dependence (HCC) [F12.20] 05/09/2017  . Closed compression fracture of L1 lumbar vertebra (HCC) [S32.010A] 05/24/2016  . Lumbar stenosis with neurogenic claudication [M48.062] 11/01/2015  . Chronic 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 [M54.41, G89.29] 11/08/2014  . Hx of hemorrhoids [Z87.19] 11/08/2014  . AAA (abdominal aortic aneurysm)  (HCC) [I71.4] 09/22/2014  . Chronic pain associated with significant psychosocial dysfunction [G89.4] 09/22/2014  . Panic attack [F41.0] 09/22/2014  . AB (asthmatic bronchitis) [J45.909] 08/17/2014  . 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 Backache [M54.9] 08/17/2014  . Lumbosacral spondylosis without myelopathy [M47.817] 08/17/2014  . Disorder of male genital organs [N50.9] 08/17/2014  . Brash [R12] 08/17/2014  . Low back pain [M54.5] 08/17/2014  . Tendon nodule [M67.90] 08/17/2014  . 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 Hernia, inguinal, right [K40.90] 08/17/2014  . Fast heart beat [R00.0] 08/17/2014  . Illness [R69] 08/17/2014  . Inguinal hernia [K40.90] 10/14/2012   Total Time spent with patient: 20 minutes  Past Psychiatric History: See H7P  Past Medical History:  Past Medical History:  Diagnosis Date  . Back pain 2014  . Bone spur   . Bulging disc 2014  . Degenerative disc disease   . Osteoarthritis   . Panic anxiety syndrome   . Taking multiple medications for chronic disease     Past Surgical History:  Procedure Laterality Date  . HERNIA REPAIR Left 2006,2014   [facility]  . TOE SURGERY Right 2007   Family History:  Family History  Problem Relation Age of Onset  . Diabetes Mother   . Diabetes Maternal Uncle   . Diabetes Maternal Grandmother   . Heart disease Maternal Grandmother    Family Psychiatric  History: No history of psychosis, huntington's disease, PD, other neurological diseases.  Social History:  Social History   Substance and Sexual Activity  Alcohol Use No     Social History   Substance and Sexual Activity  Drug Use No    Social History   Socioeconomic History  . Marital status: Married    Spouse name: None  . Number of children: None  . Years of education: None  . Highest education level: None  Social Needs  . Financial resource strain: None  . Food insecurity - worry: None  .  Food insecurity - inability: None  . Transportation needs - medical: None  . Transportation needs - non-medical: None  Occupational History  . None  Tobacco Use  . Smoking status: Never Smoker  . Smokeless tobacco: Never Used  Substance and Sexual Activity  . Alcohol use: No  . Drug use: No  . Sexual activity: None  Other Topics Concern  . None  Social History Narrative  . None   Additional Social History:    Pain Medications: Vicodin Prescriptions: Vicodin, Klonopin History of alcohol / drug use?: Yes Longest period of sobriety (when/how long): Unknown Negative Consequences of Use: Personal relationships, Financial Withdrawal Symptoms: (none) Name of Substance 1: marijuana 1 - Age of First Use: unknown 1 - Amount (size/oz): bowl 1 - Frequency: multiple times daily 1 - Duration: unknown 1 - Last Use / Amount: "a few days ago"                  Sleep: Improving  Appetite:  Good  Current Medications: Current Facility-Administered Medications  Medication Dose Route Frequency Provider Last Rate Last Dose  . acetaminophen (TYLENOL) tablet 650 mg  650 mg Oral Q6H PRN Clapacs, [name], MD   650 mg at 05/12/17 1155  . alum & mag hydroxide-simeth (MAALOX/MYLANTA) 200-200-20 MG/5ML suspension 30 mL  30 mL Oral Q4H PRN Clapacs, [name], MD   30 mL at 05/11/17 2337  . clonazePAM (KLONOPIN) tablet 1 mg  1 mg Oral TID Hanifa Antonetti, [name], MD   1 mg at 05/14/17 0829  . haloperidol (HALDOL) tablet 10 mg  10 mg Oral Q6H PRN Pucilowska, Jolanta B, MD   10 mg at 05/13/17 2227   Or  . haloperidol lactate (HALDOL) injection 10 mg  10 mg Intramuscular Q6H PRN Pucilowska, Jolanta B, MD      . HYDROcodone-acetaminophen (NORCO) 7.5-325 MG per tablet 1 tablet  1 tablet Oral Q6H PRN Clapacs, [name], MD   1 tablet at 05/12/17 2046  . hydrOXYzine (ATARAX/VISTARIL) tablet 50 mg  50 mg Oral TID PRN Clapacs, [name], MD   50 mg at 05/13/17 2227  . LORazepam (ATIVAN) tablet 2 mg  2 mg Oral Q6H PRN  Pucilowska, Jolanta B, MD   2 mg at 05/13/17 2227   Or  . LORazepam (ATIVAN) injection 2 mg  2 mg Intramuscular Q6H PRN Pucilowska, Jolanta B, MD      . magnesium hydroxide (MILK OF MAGNESIA) suspension 30 mL  30 mL Oral Daily PRN Clapacs, [name], MD   30 mL at 05/13/17 2228  . neomycin-bacitracin-polymyxin (NEOSPORIN) ointment   Topical PRN [name], MD   1 application at 05/13/17 2227  . neomycin-bacitracin-polymyxin (NEOSPORIN) ointment   Topical Daily [name], MD   1 application at 05/14/17 0830  . OLANZapine (ZYPREXA) tablet 5 mg  5 mg Oral BH-q7a Limmie Schoenberg, [name], MD   5 mg at 05/14/17 1610   And  . OLANZapine (ZYPREXA) tablet 10 mg  10 mg Oral  QHS Hasina Kreager, [name], MD   10 mg at 05/13/17 2227  . polyethylene glycol (MIRALAX / GLYCOLAX) packet 17 g  17 g Oral Daily [name], MD   17 g at 05/14/17 2130    Lab Results: No results found for this or any previous visit (from the past 48 hour(s)).  Blood Alcohol level:  Lab Results  Component Value Date   ETH <10 05/08/2017    Metabolic Disorder Labs: Lab Results  Component Value Date   HGBA1C 4.8 05/10/2017   MPG 91.06 05/10/2017   No results found for: PROLACTIN Lab Results  Component Value Date   CHOL 162 05/10/2017   TRIG 70 05/10/2017   HDL 38 (L) 05/10/2017   CHOLHDL 4.3 05/10/2017   VLDL 14 05/10/2017   LDLCALC 110 (H) 05/10/2017   LDLCALC 112 (H) 11/13/2015    Physical Findings: AIMS:  , ,  ,  ,    CIWA:  CIWA-Ar Total: 0 COWS:  COWS Total Score: 0  Musculoskeletal: Strength & Muscle Tone: within normal limits Gait & Station: normal Patient leans: N/A  Psychiatric Specialty Exam: Physical Exam  Nursing note and vitals reviewed.   ROS  Blood pressure (!) 141/88, pulse (!) 104, temperature 98.2 F (36.8 C), temperature source Oral, resp. rate 16, height 5\' 9"  (1.753 m), weight 83 kg (183 lb), SpO2 98 %.Body mass index is 27.02 kg/m.  General Appearance: Casual  Eye Contact:  Good   Speech:  Clear and Coherent  Volume:  Normal  Mood:  Irritable  Affect:  Constricted  Thought Process:  Goal Directed  Orientation:  Full (Time, Place, and Person)  Thought Content:  Delusions  Suicidal Thoughts:  No  Homicidal Thoughts:  No  Memory:  Immediate;   Fair  Judgement:  Fair  Insight:  Lacking  Psychomotor Activity:  Normal  Concentration:  Concentration: Fair  Recall:  [employer] of Knowledge:  Fair  Language:  Fair  Akathisia:  No      Assets:  Resilience  ADL's:  Intact  Cognition:  WNL  Sleep:  Number of Hours: 5.45     Treatment Plan Summary: 47 yo male admitted due to delusions and paranoia. Pt has no past diagnosis of psychosis but per his wife has had periods of paranoia and some bizarre behaviors at times over the past year but at times resolves at times. He does smoke Marijuana and he feels it was laced so this is a possibility. He does have long history of isolating and agoraphobia which could be negative symptoms of a primary psychotic disorder. Medical workup has been largely normal including head CT. 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 He is improving slightly on Zyprexa so far.   Plan:  Psychosis -Continue Zyprexa 5 mg qam and 10 mg qhs. This was increased last night -Head CT negative -Will check Parathyroid and Vitamin D  to rule out any organic causes of psychosis. Calcium has been normal but does have history of osteopenia  Anxiety -Decrease Klonopin to 0.5 mg TID  Chronic Pain  -Percocet prn   Dispo -He will return home with wife when stable    Haskell Riling, MD 05/14/2017, 11:58 AM

## 2017-05-14 NOTE — Progress Notes (Signed)
Patient verbalized that he feels better and safe here.Rated his depression 0/10 and anxiety 2/10.Denies SI,HI and AVH.Isolated in the room most of the time.Minimal interactions with peers and staff.Compliant with medications.Did not attend groups.Appetite and energy level good.Patient is looking forward to get discharge to be with his wife and daughter.Support and encouragement given.

## 2017-05-15 LAB — PARATHYROID HORMONE, INTACT (NO CA): PTH: 28 pg/mL (ref 15–65)

## 2017-05-15 LAB — VITAMIN D 25 HYDROXY (VIT D DEFICIENCY, FRACTURES): Vit D, 25-Hydroxy: 21.5 ng/mL — ABNORMAL LOW (ref 30.0–100.0)

## 2017-05-15 MED ORDER — VITAMIN D 1000 UNITS PO TABS
2000.0000 [IU] | ORAL_TABLET | Freq: Every day | ORAL | Status: DC
  Administered 2017-05-15 – 2017-05-23 (×9): 2000 [IU] via ORAL
  Filled 2017-05-15 (×9): qty 2

## 2017-05-15 MED ORDER — CLONAZEPAM 1 MG PO TABS
1.0000 mg | ORAL_TABLET | Freq: Three times a day (TID) | ORAL | Status: DC
  Administered 2017-05-15 – 2017-05-23 (×24): 1 mg via ORAL
  Filled 2017-05-15 (×24): qty 1

## 2017-05-15 MED ORDER — CLONIDINE HCL 0.1 MG PO TABS: 0.1000 mg | ORAL_TABLET | Freq: Two times a day (BID) | ORAL | Status: DC | PRN

## 2017-05-15 NOTE — Plan of Care (Signed)
Patient slept for Estimated Hours of 6; Precautionary checks every 15 minutes for safety maintained, room free of safety hazards, patient sustains no injury or falls during this shift.  

## 2017-05-15 NOTE — Progress Notes (Signed)
Recreation Therapy Notes  Date: 01.24.2019  Time: 1:00 PM  Location: Craft Room  Behavioral response: Appropriate   Intervention Topic: Problem Solving  Discussion/Intervention: Group content on today was focused on problem solving. The group described what problem solving is. Patients expressed how problems affect them and how they deal with problems. Individuals identified healthy ways to deal with problems. Patients explained what normally happens to them when they do not deal with problems. The group expressed reoccurring problems for them. The group participated in the intervention "Ways to Solve problems" where patients were given a chance to explore different ways to solve problems.  Clinical Observations/Feedback:  Patient came to group and stated some negative ways he handled problems in the past was drugs and drinking. He expressed some positive ways he can solve his problems in the future is Yoga, Deep Breathing and Reading. Individual left group early he explained he was in pain.   Kade Demicco LRT/CTRS          Keili Hasten 05/15/2017 2:02 PM

## 2017-05-15 NOTE — Progress Notes (Signed)
Patient came to the nurses' station, "I don't feel right, can someone take my VS?..."

## 2017-05-15 NOTE — Progress Notes (Signed)
Patient resumes somatic c/o "foreign object in stoamach, call 911, I am dying; I need a CT Scan..."  MOM given for constipation.

## 2017-05-15 NOTE — Progress Notes (Signed)
Patient ID: David Reeves, male   DOB: 1971-02-12, 47 y.o.   MRN: [id number] No somatic c/o during this shift - denied abdominal or head aches, mood and affect relevantly appropriate, conversations are logical, sensible and reasonable; attended the wrap up group, denies SI/HI/AVH; no temper tantrums, no aggressive behaviors, respect others personal spaces.

## 2017-05-15 NOTE — Progress Notes (Signed)
D- Patient alert and oriented. Patient presents in a pleasant mood stating that he didn't sleep too well last night and is endorsing some anxiety on assessment stating that he is experiencing some "discomfort in my stomach, enough to distract me". Patient rates his anxiety level a "6/10" stating that "I think I'm going through slight benzo withdrawal". Patient reports a pain level of "2/10" in his abdomen. Patient denies SI, HI, AVH, at this time. Patient also denies depression at this time.   A- Scheduled medications administered to patient, per MD orders. Support and encouragement provided.  Routine safety checks conducted every 15 minutes.  Patient informed to notify staff with problems or concerns.  R- No adverse drug reactions noted. Patient contracts for safety at this time. Patient compliant with medications and treatment plan. Patient receptive, calm, and cooperative. Patient interacts well with others on the unit.  Patient remains safe at this time.

## 2017-05-15 NOTE — Progress Notes (Addendum)
[facility] MD Progress Note  05/15/2017 1:39 PM David Reeves  MRN:  [id number] Subjective:  Pt was calm during interview this morning. He is still delusional although less prominent. He discusses that he keeps feeling "electrical shocks through my body." He still feels that there was a shock collar implanted in his rectum from the [facility]. Pt states that he feels much less anxious right now. He was having a lot of anxiety last night due to "electrical shocks." He states, "I think we are on the right direction with medications." He states that he feels much safer on the unit and trusts the staff and the medications now. He denies SI, HI, AH, VH. He has isolating to his room but he is trying to get ou more. He states, "A lot of noise and talking bothers me sometimes. "  Principal Problem: Schizophrenia (HCC) Diagnosis:   Patient Active Problem List   Diagnosis Date Noted  . Schizophrenia (HCC) [F20.9] 05/14/2017    Priority: High  . Laceration of right hand without foreign body [S61.411A]   . Cannabis use disorder, moderate, dependence (HCC) [F12.20] 05/09/2017  . Closed compression fracture of L1 lumbar vertebra (HCC) [S32.010A] 05/24/2016  . Lumbar stenosis with neurogenic claudication [M48.062] 11/01/2015  . Chronic 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 [M54.41, G89.29] 11/08/2014  . Hx of hemorrhoids [Z87.19] 11/08/2014  . AAA (abdominal aortic aneurysm) (HCC) [I71.4] 09/22/2014  . Chronic pain associated with significant psychosocial dysfunction [G89.4] 09/22/2014  . Panic attack [F41.0] 09/22/2014  . AB (asthmatic bronchitis) [J45.909] 08/17/2014  . 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 Backache [M54.9] 08/17/2014  . Lumbosacral spondylosis without myelopathy [M47.817] 08/17/2014  . Disorder of male genital organs [N50.9] 08/17/2014  . Brash [R12] 08/17/2014  . Low back pain [M54.5] 08/17/2014  . Tendon nodule [M67.90]  08/17/2014  . 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 Hernia, inguinal, right [K40.90] 08/17/2014  . Fast heart beat [R00.0] 08/17/2014  . Illness [R69] 08/17/2014  . Inguinal hernia [K40.90] 10/14/2012   Total Time spent with patient: 20 minutes  Past Psychiatric History: See H&P  Past Medical History:  Past Medical History:  Diagnosis Date  . Back pain 2014  . Bone spur   . Bulging disc 2014  . Degenerative disc disease   . Osteoarthritis   . Panic anxiety syndrome   . Taking multiple medications for chronic disease     Past Surgical History:  Procedure Laterality Date  . HERNIA REPAIR Left 2006,2014   [facility]  . TOE SURGERY Right 2007   Family History:  Family History  Problem Relation Age of Onset  . Diabetes Mother   . Diabetes Maternal Uncle   . Diabetes Maternal Grandmother   . Heart disease Maternal Grandmother    Family Psychiatric  History: See H&P Social History:  Social History   Substance and Sexual Activity  Alcohol Use No     Social History   Substance and Sexual Activity  Drug Use No    Social History   Socioeconomic History  . Marital status: Married    Spouse name: None  . Number of children: None  . Years of education: None  . Highest education level: None  Social Needs  . Financial resource strain: None  . Food insecurity - worry: None  . Food insecurity - inability: None  . Transportation needs - medical: None  . Transportation needs - non-medical: None  Occupational History  .  None  Tobacco Use  . Smoking status: Never Smoker  . Smokeless tobacco: Never Used  Substance and Sexual Activity  . Alcohol use: No  . Drug use: No  . Sexual activity: None  Other Topics Concern  . None  Social History Narrative  . None   Additional Social History:    Pain Medications: Vicodin Prescriptions: Vicodin, Klonopin History of alcohol / drug use?: Yes Longest period of sobriety (when/how long): Unknown Negative  Consequences of Use: Personal relationships, Financial Withdrawal Symptoms: (none) Name of Substance 1: marijuana 1 - Age of First Use: unknown 1 - Amount (size/oz): bowl 1 - Frequency: multiple times daily 1 - Duration: unknown 1 - Last Use / Amount: "a few days ago"                  Sleep: Fair , improving Appetite:  Good  Current Medications: Current Facility-Administered Medications  Medication Dose Route Frequency Provider Last Rate Last Dose  . acetaminophen (TYLENOL) tablet 650 mg  650 mg Oral Q6H PRN Clapacs, [name], MD   650 mg at 05/12/17 1155  . alum & mag hydroxide-simeth (MAALOX/MYLANTA) 200-200-20 MG/5ML suspension 30 mL  30 mL Oral Q4H PRN Clapacs, [name], MD   30 mL at 05/15/17 1130  . cholecalciferol (VITAMIN D) tablet 2,000 Units  2,000 Units Oral Daily Haskell Riling, MD   2,000 Units at 05/15/17 1124  . clonazePAM (KLONOPIN) tablet 0.5 mg  0.5 mg Oral TID Corinna Gab R, MD   0.5 mg at 05/15/17 1124  . haloperidol (HALDOL) tablet 10 mg  10 mg Oral Q6H PRN Pucilowska, Jolanta B, MD   10 mg at 05/13/17 2227   Or  . haloperidol lactate (HALDOL) injection 10 mg  10 mg Intramuscular Q6H PRN Pucilowska, Jolanta B, MD      . HYDROcodone-acetaminophen (NORCO) 7.5-325 MG per tablet 1 tablet  1 tablet Oral Q6H PRN Clapacs, [name], MD   1 tablet at 05/12/17 2046  . hydrOXYzine (ATARAX/VISTARIL) tablet 50 mg  50 mg Oral TID PRN Clapacs, [name], MD   50 mg at 05/15/17 1610  . LORazepam (ATIVAN) tablet 2 mg  2 mg Oral Q6H PRN Pucilowska, Jolanta B, MD   2 mg at 05/13/17 2227   Or  . LORazepam (ATIVAN) injection 2 mg  2 mg Intramuscular Q6H PRN Pucilowska, Jolanta B, MD      . magnesium hydroxide (MILK OF MAGNESIA) suspension 30 mL  30 mL Oral Daily PRN Clapacs, [name], MD   30 mL at 05/15/17 0407  . neomycin-bacitracin-polymyxin (NEOSPORIN) ointment   Topical PRN [name], MD   1 application at 05/13/17 2227  . neomycin-bacitracin-polymyxin (NEOSPORIN) ointment    Topical Daily [name], MD   1 application at 05/14/17 0830  . OLANZapine (ZYPREXA) tablet 5 mg  5 mg Oral BH-q7a Norwin Aleman, [name], MD   5 mg at 05/15/17 9604   And  . OLANZapine (ZYPREXA) tablet 10 mg  10 mg Oral QHS Cana Mignano, [name], MD   10 mg at 05/14/17 2114  . polyethylene glycol (MIRALAX / GLYCOLAX) packet 17 g  17 g Oral Daily [name], MD   17 g at 05/15/17 0830    Lab Results:  Results for orders placed or performed during the hospital encounter of 05/09/17 (from the past 48 hour(s))  Parathyroid hormone, intact (no Ca)     Status: None   Collection Time: 05/14/17  3:42 PM  Result Value  Ref Range   PTH 28 15 - 65 pg/mL    Comment: (NOTE) Performed At: [facility] [address] [place], [place] [id number] Jolene Schimke MD [id number] Performed at [facility], [address] [place], [place] [address]   VITAMIN D 25 Hydroxy (Vit-D Deficiency, Fractures)     Status: Abnormal   Collection Time: 05/14/17  3:42 PM  Result Value Ref Range   Vit D, 25-Hydroxy 21.5 (L) 30.0 - 100.0 ng/mL    Comment: (NOTE) Vitamin D deficiency has been defined by the Institute of Medicine and an Endocrine Society practice guideline as a level of serum 25-OH vitamin D less than 20 ng/mL (1,2). The Endocrine Society went on to further define vitamin D insufficiency as a level between 21 and 29 ng/mL (2). 1. IOM (Institute of Medicine). 2010. Dietary reference   intakes for calcium and D. [place]: The   [employer]. 2. Holick MF, Binkley Troy, Bischoff-Ferrari HA, et al.   Evaluation, treatment, and prevention of vitamin D   deficiency: an Endocrine Society clinical practice   guideline. JCEM. 2011 Jul; 96(7):1911-30. Performed At: [facility] [address] [place], [place] [id number] Jolene Schimke MD [id number] Performed at [facility], [address] [place], [place] [address]     Blood Alcohol level:  Lab  Results  Component Value Date   [facility] <10 05/08/2017    Metabolic Disorder Labs: Lab Results  Component Value Date   HGBA1C 4.8 05/10/2017   MPG 91.06 05/10/2017   No results found for: PROLACTIN Lab Results  Component Value Date   CHOL 162 05/10/2017   TRIG 70 05/10/2017   HDL 38 (L) 05/10/2017   CHOLHDL 4.3 05/10/2017   VLDL 14 05/10/2017   LDLCALC 110 (H) 05/10/2017   LDLCALC 112 (H) 11/13/2015    Physical Findings: AIMS:  , ,  ,  ,    CIWA:  CIWA-Ar Total: 0 COWS:  COWS Total Score: 0  Musculoskeletal: Strength & Muscle Tone: within normal limits Gait & Station: normal, has back brace on for back stabilization Patient leans: N/A  Psychiatric Specialty Exam: Physical Exam  Nursing note and vitals reviewed. No muscle rigidity noted on exam  Review of Systems  Cardiovascular: Negative for chest pain.  All other systems reviewed and are negative.   Blood pressure (!) 144/99, pulse (!) 111, temperature 98.2 F (36.8 C), temperature source Oral, resp. rate 16, height 5\' 9"  (1.753 m), weight 83 kg (183 lb), SpO2 98 %.Body mass index is 27.02 kg/m.  General Appearance: Casual  Eye Contact:  Minimal  Speech:  Clear and Coherent  Volume:  Normal  Mood:  Euthymic  Affect:  Congruent  Thought Process:  Coherent  Orientation:  Full (Time, Place, and Person)  Thought Content:  Illogical  Suicidal Thoughts:  No  Homicidal Thoughts:  No  Memory:  Immediate;   Fair  Judgement:  Fair  Insight:  Fair  Psychomotor Activity:  Normal  Concentration:  Concentration: Fair  Recall:  [employer] of Knowledge:  Fair  Language:  Fair  Akathisia:  No      Assets:  Resilience  ADL's:  Intact  Cognition:  WNL  Sleep:  Number of Hours: 6     Treatment Plan Summary: 47 yo male admitted due to delusions. Pt is less paranoid and anxious on the unit. He is still delusional and believes a shock collar was implanted in his rectum. He laughs about it and states, "  It definitely  wasn't with my consent." HE did have some anxiety this morning and asked for his vitals to be checked. He was tachycardic-this could be due to anxiety and paranoia (or from decrease in Klonopin) and will monitor. HE denies any chest pain or SOB.   Plan:  Schizophrenia -Continue Zyprexa 5 mg and 10 mg qhs. Will monitor vitals and possibly increase dose tomorrow. He has no muscle rigidity on exam and no fever -EKG-normal sinus rhythm, shortened QT from last EKG, QTc 393   Vitamin D deficiency -Vitamin D was 21.5. Will restart home replacement at 2000 units -Parathyroid normal  Anxiety -Will increase Klonopin back to home dose of 1 mg TID. He was possibly having some w/d symptoms from decrease in dose.  Tachycardia -Will monitor vitals, no muscle rigitity or fever suggestive of NMS, no chest pain   Dispo -Will return home when stable Haskell RilingHolly R Dianne Bady, MD 05/15/2017, 1:39 PM

## 2017-05-15 NOTE — [facility] Group Notes (Signed)
05/15/2017  Time: 0930  Type of Therapy/Topic:  Group Therapy:  Balance in Life  Participation Level:  Did Not Attend  Description of Group:   This group will address the concept of balance and how it feels and looks when one is unbalanced. Patients will be encouraged to process areas in their lives that are out of balance and identify reasons for remaining unbalanced. Facilitators will guide patients in utilizing problem-solving interventions to address and correct the stressor making their life unbalanced. Understanding and applying boundaries will be explored and addressed for obtaining and maintaining a balanced life. Patients will be encouraged to explore ways to assertively make their unbalanced needs known to significant others in their lives, using other group members and facilitator for support and feedback.  Therapeutic Goals: 1. Patient will identify two or more emotions or situations they have that consume much of in their lives. 2. Patient will identify signs/triggers that life has become out of balance:  3. Patient will identify two ways to set boundaries in order to achieve balance in their lives:  4. Patient will demonstrate ability to communicate their needs through discussion and/or role plays  Summary of Patient Progress: Pt was invited to attend group but chose not to attend. CSW will continue to encourage pt to attend group throughout their admission.   Therapeutic Modalities:   Cognitive Behavioral Therapy Solution-Focused Therapy Assertiveness Training  Heidi DachKelsey Esteen Delpriore, MSW, LCSW 05/15/2017 10:38 AM

## 2017-05-15 NOTE — Plan of Care (Signed)
Patient's comfort level has improved and he verbalizes understanding of the general information that has been provided to him without voicing any further questions or concerns at this time. Patient denies SI/HI/AVH as well as any signs/symptoms of depression at this time. Patient has been demonstrating self-control on the unit all day and has told this writer that "I'm ok". Patient has been in compliance with his medication regimen. Patient hasn't really had much of an appetite today. Patient has not participated in unit groups today, he has been getting some rest because he didn't sleep well last night. Patient has remained free from injury and is safe on the unit at this time.

## 2017-05-15 NOTE — [facility] Group Notes (Addendum)
  LCSW Group Therapy Note 05/15/2017 9:00 AM  Type of Therapy and Topic:  Group Therapy:  Setting Goals  Participation Level:  Did Not Attend  Description of Group: In this process group, patients discussed using strengths to work toward goals and address challenges.  Patients identified two positive things about themselves and one goal they were working on.  Patients were given the opportunity to share openly and support each other's plan for self-empowerment.  The group discussed the value of gratitude and were encouraged to have a daily reflection of positive characteristics or circumstances.  Patients were encouraged to identify a plan to utilize their strengths to work on current challenges and goals.  Therapeutic Goals 1. Patient will verbalize personal strengths/positive qualities and relate how these can assist with achieving desired personal goals 2. Patients will verbalize affirmation of peers plans for personal change and goal setting 3. Patients will explore the value of gratitude and positive focus as related to successful achievement of goals 4. Patients will verbalize a plan for regular reinforcement of personal positive qualities and circumstances.  Summary of Patient Progress:       Therapeutic Modalities Cognitive Behavioral Therapy Motivational Interviewing    [name], Alexander MtLCSW 05/15/2017 12:34 PM

## 2017-05-16 MED ORDER — OLANZAPINE 5 MG PO TABS
5.0000 mg | ORAL_TABLET | ORAL | Status: DC
  Administered 2017-05-17 – 2017-05-20 (×4): 5 mg via ORAL
  Filled 2017-05-16 (×4): qty 1

## 2017-05-16 MED ORDER — OLANZAPINE 5 MG PO TABS
15.0000 mg | ORAL_TABLET | Freq: Every day | ORAL | Status: DC
  Administered 2017-05-16 – 2017-05-19 (×4): 15 mg via ORAL
  Filled 2017-05-16 (×4): qty 3

## 2017-05-16 MED ORDER — PROPRANOLOL HCL 20 MG PO TABS
10.0000 mg | ORAL_TABLET | Freq: Every day | ORAL | Status: DC
  Administered 2017-05-16 – 2017-05-19 (×4): 10 mg via ORAL
  Filled 2017-05-16 (×4): qty 1

## 2017-05-16 NOTE — [facility] Group Notes (Signed)
[facility] Group Notes:  (Nursing/MHT/Case Management/Adjunct)  Date:  05/16/2017  Time:  2:57 PM  Type of Therapy:  Psychoeducational Skills  Participation Level:  None   Summary of Progress/Problems:  David Reeves 05/16/2017, 2:57 PM

## 2017-05-16 NOTE — [facility] Group Notes (Signed)
05/16/2017 9:30AM  Type of Therapy and Topic:  Group Therapy:  Feelings around Relapse and Recovery  Participation Level:  Active   Description of Group:    Patients in this group will discuss emotions they experience before and after a relapse. They will process how experiencing these feelings, or avoidance of experiencing them, relates to having a relapse. Facilitator will guide patients to explore emotions they have related to recovery. Patients will be encouraged to process which emotions are more powerful. They will be guided to discuss the emotional reaction significant others in their lives may have to patients' relapse or recovery. Patients will be assisted in exploring ways to respond to the emotions of others without this contributing to a relapse.  Therapeutic Goals: 1. Patient will identify two or more emotions that lead to a relapse for them 2. Patient will identify two emotions that result when they relapse 3. Patient will identify two emotions related to recovery 4. Patient will demonstrate ability to communicate their needs through discussion and/or role plays   Summary of Patient Progress: Actively and appropriately engaged in the group. Patient was able to provide support and validation to other group members.Patient practiced active listening when interacting with the facilitator and other group members Patient in still in the process of obtaining treatment goals.     Therapeutic Modalities:   Cognitive Behavioral Therapy Solution-Focused Therapy Assertiveness Training Relapse Prevention Therapy   Johny ShearsCassandra  Lennard Capek, LCSW 05/16/2017 10:28 AM

## 2017-05-16 NOTE — Tx Team (Signed)
Interdisciplinary Treatment and Diagnostic Plan Update  05/16/2017 Time of Session: 1100 David Reeves MRN: [id number]  Principal Diagnosis: Schizophrenia [facility])  Secondary Diagnoses: Principal Problem:   Schizophrenia (HCC) Active Problems:   Cannabis use disorder, moderate, dependence (HCC)   Current Medications:  Current Facility-Administered Medications  Medication Dose Route Frequency Provider Last Rate Last Dose  . acetaminophen (TYLENOL) tablet 650 mg  650 mg Oral Q6H PRN Clapacs, David Denmark, MD   650 mg at 05/12/17 1155  . alum & mag hydroxide-simeth (MAALOX/MYLANTA) 200-200-20 MG/5ML suspension 30 mL  30 mL Oral Q4H PRN Clapacs, David T, MD   30 mL at 05/15/17 1130  . cholecalciferol (VITAMIN D) tablet 2,000 Units  2,000 Units Oral Daily Haskell Riling, MD   2,000 Units at 05/16/17 0753  . clonazePAM (KLONOPIN) tablet 1 mg  1 mg Oral TID Haskell Riling, MD   1 mg at 05/16/17 0753  . cloNIDine (CATAPRES) tablet 0.1 mg  0.1 mg Oral BID PRN [name], David Hutchinson, MD      . haloperidol (HALDOL) tablet 10 mg  10 mg Oral Q6H PRN Reeves, David B, MD   10 mg at 05/13/17 2227   Or  . haloperidol lactate (HALDOL) injection 10 mg  10 mg Intramuscular Q6H PRN Reeves, David B, MD      . HYDROcodone-acetaminophen (NORCO) 7.5-325 MG per tablet 1 tablet  1 tablet Oral Q6H PRN Clapacs, David Denmark, MD   1 tablet at 05/12/17 2046  . hydrOXYzine (ATARAX/VISTARIL) tablet 50 mg  50 mg Oral TID PRN Clapacs, David Denmark, MD   50 mg at 05/16/17 0655  . magnesium hydroxide (MILK OF MAGNESIA) suspension 30 mL  30 mL Oral Daily PRN Clapacs, David Denmark, MD   30 mL at 05/16/17 0753  . neomycin-bacitracin-polymyxin (NEOSPORIN) ointment   Topical PRN David Sessions, MD   1 application at 05/13/17 2227  . neomycin-bacitracin-polymyxin (NEOSPORIN) ointment   Topical Daily David Dodge, MD   1 application at 05/14/17 0830  . OLANZapine (ZYPREXA) tablet 5 mg  5 mg Oral BH-q7a [name], David Hutchinson, MD   5 mg at 05/16/17  0753   And  . OLANZapine (ZYPREXA) tablet 10 mg  10 mg Oral QHS [name], David Hutchinson, MD   10 mg at 05/15/17 2137  . polyethylene glycol (MIRALAX / GLYCOLAX) packet 17 g  17 g Oral Daily David Sessions, MD   17 g at 05/15/17 0830   PTA Medications: Medications Prior to Admission  Medication Sig Dispense Refill Last Dose  . Calcium Carb-Cholecalciferol (CALCIUM+D3) 600-800 MG-UNIT TABS Take by mouth.   N/A at N/A  . celecoxib (CELEBREX) 200 MG capsule Take 1 capsule by mouth  twice daily as needed 180 capsule 2 N/A at N/A  . Cholecalciferol (VITAMIN D3) 2000 units TABS Take by mouth.   N/A at N/A  . clonazePAM (KLONOPIN) 1 MG tablet Take 2 tablets in the morning, 1 afternoon and 1 at bedtime by mouth as needed for anxiety/panic. 100 tablet 3 N/A at N/A  . Glucosamine-Chondroit-Vit C-Mn (GLUCOSAMINE CHONDR 1500 COMPLX PO) Take by mouth.   N/A at N/A  . HYDROcodone-acetaminophen (NORCO) 7.5-325 MG tablet Take 1 tablet by mouth 3 (three) times daily as needed. 90 tablet 0 N/A at N/A  . Multiple Vitamin (MULTIVITAMIN) tablet Take 1 tablet by mouth daily.   N/A at N/A  . Probiotic Product (PROBIOTIC COLON SUPPORT PO) Take by mouth.   N/A at N/A  . tiZANidine (ZANAFLEX) 4  MG tablet TAKE 1 TABLET BY MOUTH 3  TIMES DAILY 270 tablet 1 N/A at N/A    Patient Stressors: Substance abuse Other: paranoia and delusional thoughts  Patient Strengths: Average or above average intelligence Capable of independent living Communication skills General fund of knowledge Supportive family/friends  Treatment Modalities: Medication Management, Group therapy, Case management,  1 to 1 session with clinician, Psychoeducation, Recreational therapy.   Physician Treatment Plan for Primary Diagnosis: Schizophrenia (HCC) Long Term Goal(s): Improvement in symptoms so as ready for discharge Improvement in symptoms so as ready for discharge   Short Term Goals: Ability to identify changes in lifestyle to reduce recurrence  of condition will improve Ability to verbalize feelings will improve Ability to disclose and discuss suicidal ideas Ability to demonstrate self-control will improve Ability to identify and develop effective coping behaviors will improve Ability to maintain clinical measurements within normal limits will improve Compliance with prescribed medications will improve Ability to identify triggers associated with substance abuse/mental health issues will improve Ability to identify changes in lifestyle to reduce recurrence of condition will improve Ability to verbalize feelings will improve Ability to disclose and discuss suicidal ideas Ability to demonstrate self-control will improve Ability to identify and develop effective coping behaviors will improve Ability to maintain clinical measurements within normal limits will improve Compliance with prescribed medications will improve Ability to identify triggers associated with substance abuse/mental health issues will improve  Medication Management: Evaluate patient's response, side effects, and tolerance of medication regimen.  Therapeutic Interventions: 1 to 1 Reeves, Unit Group Reeves and Medication administration.  Evaluation of Outcomes: Progressing  Physician Treatment Plan for Secondary Diagnosis: Principal Problem:   Schizophrenia (HCC) Active Problems:   Cannabis use disorder, moderate, dependence (HCC)  Long Term Goal(s): Improvement in symptoms so as ready for discharge Improvement in symptoms so as ready for discharge   Short Term Goals: Ability to identify changes in lifestyle to reduce recurrence of condition will improve Ability to verbalize feelings will improve Ability to disclose and discuss suicidal ideas Ability to demonstrate self-control will improve Ability to identify and develop effective coping behaviors will improve Ability to maintain clinical measurements within normal limits will improve Compliance with  prescribed medications will improve Ability to identify triggers associated with substance abuse/mental health issues will improve Ability to identify changes in lifestyle to reduce recurrence of condition will improve Ability to verbalize feelings will improve Ability to disclose and discuss suicidal ideas Ability to demonstrate self-control will improve Ability to identify and develop effective coping behaviors will improve Ability to maintain clinical measurements within normal limits will improve Compliance with prescribed medications will improve Ability to identify triggers associated with substance abuse/mental health issues will improve     Medication Management: Evaluate patient's response, side effects, and tolerance of medication regimen.  Therapeutic Interventions: 1 to 1 Reeves, Unit Group Reeves and Medication administration.  Evaluation of Outcomes: Progressing   RN Treatment Plan for Primary Diagnosis: Schizophrenia (HCC) Long Term Goal(s): Knowledge of disease and therapeutic regimen to maintain health will improve  Short Term Goals: Ability to verbalize feelings will improve, Ability to identify and develop effective coping behaviors will improve and Compliance with prescribed medications will improve  Medication Management: RN will administer medications as ordered by provider, will assess and evaluate patient's response and provide education to patient for prescribed medication. RN will report any adverse and/or side effects to prescribing provider.  Therapeutic Interventions: 1 on 1 counseling Reeves, Psychoeducation, Medication administration, Evaluate responses to treatment, Monitor  vital signs and CBGs as ordered, Perform/monitor CIWA, COWS, AIMS and Fall Risk screenings as ordered, Perform wound care treatments as ordered.  Evaluation of Outcomes: Progressing   LCSW Treatment Plan for Primary Diagnosis: Schizophrenia (HCC) Long Term Goal(s): Safe  transition to appropriate next level of care at discharge, Engage patient in therapeutic group addressing interpersonal concerns.  Short Term Goals: Engage patient in aftercare planning with referrals and resources, Increase ability to appropriately verbalize feelings and Increase emotional regulation  Therapeutic Interventions: Assess for all discharge needs, 1 to 1 time with Social worker, Explore available resources and support systems, Assess for adequacy in community support network, Educate family and significant other(s) on suicide prevention, Complete Psychosocial Assessment, Interpersonal group therapy.  Evaluation of Outcomes: Progressing   Progress in Treatment: Attending groups: Yes. Participating in groups: Yes. Taking medication as prescribed: Yes. Toleration medication: Yes. Family/Significant other contact made: Yes, with pt's wife.  Patient understands diagnosis: Yes. Discussing patient identified problems/goals with staff: Yes. Medical problems stabilized or resolved: Yes. Denies suicidal/homicidal ideation: Yes. Issues/concerns per patient self-inventory: No. Other: None at this time.   New problem(s) identified: No, Describe:  None at this time.  New Short Term/Long Term Goal(s): Pt reported his goal for treatment is to, "go home and be with my family."   Discharge Plan or Barriers: CSW will continue to assess for an appropriate discharge plan.   Reason for Continuation of Hospitalization: Aggression Delusions  Medication stabilization  Estimated Length of Stay: 5-7 days   Recreational Therapy: Patient Stressors: N/A Patient Goal: Patient will attend group in a calm and appropriate manner with no impulsive behaviors x5 days.   Attendees: Patient: 05/16/2017 11:17 AM  Physician: Dr. Johnella Moloney, MD 05/16/2017 11:17 AM  Nursing: Ace Gins, RN 05/16/2017 11:17 AM  RN Care Manager: 05/16/2017 11:17 AM  Social Worker: Heidi Dach, LCSW 05/16/2017 11:17 AM   Recreational Therapist: Garret Reddish, CTRS-LRT 05/16/2017 11:17 AM  Other: Johny Shears, LCSWA 05/16/2017 11:17 AM  Other:  05/16/2017 11:17 AM  Other: 05/16/2017 11:17 AM    Scribe for Treatment Team: Heidi Dach, LCSW 05/16/2017 11:23 AM

## 2017-05-16 NOTE — Plan of Care (Signed)
Pt. Reports improved pain control this evening. Pt. Denies pain this evening. Pt. Able to demonstrate appropriate behavior on the unit this evening with peers during snack and groups. Pt. Compliant with medications this evening. Pt. Reports, "I eat till I'm all filled up, I'm eatin' good". Pt. Able to remain free from injury on the unit this evening. Pt. Able to identify agitations and anxieties this evening. Pt. Sleep reports state he is sleeping improved.  Pt. Needs reinforcement on provided education. Pt. States he feels, "worse" today then he did yesterday, but at times changes his opinion on how he feels about the day and will sometimes report feeling, "good" today. Pt. This evening presents with psychotic symptoms and can be very tangential, utilizing word salad and odd paranoid behaviors at times.  Progressing Pain Managment: General experience of comfort will improve 05/16/2017 2230 - Progressing by Lenox PondsStevens, Lataria Courser J, RN Coping: Ability to demonstrate self-control will improve 05/16/2017 2230 - Progressing by Lenox PondsStevens, Alazay Leicht J, RN Health Behavior/Discharge Planning: Compliance with treatment plan for underlying cause of condition will improve 05/16/2017 2230 - Progressing by Lenox PondsStevens, Dacen Frayre J, RN Nutritional: Ability to achieve adequate nutritional intake will improve 05/16/2017 2230 - Progressing by Lenox PondsStevens, Kameo Bains J, RN Safety: Ability to redirect hostility and anger into socially appropriate behaviors will improve 05/16/2017 2230 - Progressing by Lenox PondsStevens, Iszabella Hebenstreit J, RN Ability to remain free from injury will improve 05/16/2017 2230 - Progressing by Lenox PondsStevens, Sadat Sliwa J, RN [facility] Participation in Recreation Therapeutic Interventions STG-Other Recreation Therapy Goal (Specify) Description Patient will attend group in a calm and appropriate manner with no impulsive behaviors x5 days.  05/16/2017 2230 - Progressing by Lenox PondsStevens, Wylee Ogden J, RN Activity: Sleeping patterns will improve 05/16/2017 2230 -  Progressing by Lenox PondsStevens, Graelyn Bihl J, RN

## 2017-05-16 NOTE — Progress Notes (Signed)
[facility] MD Progress Note  05/16/2017 1:00 PM Kennett Symes  MRN:  [id number]   Subjective:  Pt is much calmer than on admission. He states that he feels much safer on the unit although he think there are still "some people giving off bad vibes." He is still very delusional. He still feels something was placed in his rectum. Today he feels that it was a fentanyl pump and is releasing fentanyl at certain times. He stats that he is not longer feeling the shocks that he was feeling. HE asked appropriate questions about the IVC that was placed on him.HE states that he still feels like "there are things that are listening to [place] in the house." He is not sure if there were items placed in his dogs or not which he was previously perseverative on. He states that he feels the Zyprexa is helping a lot "with pain." He states that he wants to see a pain doctor to see about a pain pump for his back pain. He denies AH, VH, HI, SI.   Principal Problem: Schizophrenia (HCC) Diagnosis:   Patient Active Problem List   Diagnosis Date Noted  . Schizophrenia (HCC) [F20.9] 05/14/2017    Priority: High  . Laceration of right hand without foreign body [S61.411A]   . Cannabis use disorder, moderate, dependence (HCC) [F12.20] 05/09/2017  . Closed compression fracture of L1 lumbar vertebra (HCC) [S32.010A] 05/24/2016  . Lumbar stenosis with neurogenic claudication [M48.062] 11/01/2015  . Chronic 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 [M54.41, G89.29] 11/08/2014  . Hx of hemorrhoids [Z87.19] 11/08/2014  . AAA (abdominal aortic aneurysm) (HCC) [I71.4] 09/22/2014  . Chronic pain associated with significant psychosocial dysfunction [G89.4] 09/22/2014  . Panic attack [F41.0] 09/22/2014  . AB (asthmatic bronchitis) [J45.909] 08/17/2014  . 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 Backache [M54.9] 08/17/2014  . Lumbosacral spondylosis without myelopathy [M47.817] 08/17/2014  .  Disorder of male genital organs [N50.9] 08/17/2014  . Brash [R12] 08/17/2014  . Low back pain [M54.5] 08/17/2014  . Tendon nodule [M67.90] 08/17/2014  . 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 Hernia, inguinal, right [K40.90] 08/17/2014  . Fast heart beat [R00.0] 08/17/2014  . Illness [R69] 08/17/2014  . Inguinal hernia [K40.90] 10/14/2012   Total Time spent with patient: 20 minutes  Past Psychiatric History: See H&P  Past Medical History:  Past Medical History:  Diagnosis Date  . Back pain 2014  . Bone spur   . Bulging disc 2014  . Degenerative disc disease   . Osteoarthritis   . Panic anxiety syndrome   . Taking multiple medications for chronic disease     Past Surgical History:  Procedure Laterality Date  . HERNIA REPAIR Left 2006,2014   [facility]  . TOE SURGERY Right 2007   Family History:  Family History  Problem Relation Age of Onset  . Diabetes Mother   . Diabetes Maternal Uncle   . Diabetes Maternal Grandmother   . Heart disease Maternal Grandmother    Family Psychiatric  History: See H&P Social History:  Social History   Substance and Sexual Activity  Alcohol Use No     Social History   Substance and Sexual Activity  Drug Use No    Social History   Socioeconomic History  . Marital status: Married    Spouse name: None  . Number of children: None  . Years of education: None  . Highest education level: None  Social Needs  . Financial  resource strain: None  . Food insecurity - worry: None  . Food insecurity - inability: None  . Transportation needs - medical: None  . Transportation needs - non-medical: None  Occupational History  . None  Tobacco Use  . Smoking status: Never Smoker  . Smokeless tobacco: Never Used  Substance and Sexual Activity  . Alcohol use: No  . Drug use: No  . Sexual activity: None  Other Topics Concern  . None  Social History Narrative  . None   Additional Social History:    Pain Medications:  Vicodin Prescriptions: Vicodin, Klonopin History of alcohol / drug use?: Yes Longest period of sobriety (when/how long): Unknown Negative Consequences of Use: Personal relationships, Financial Withdrawal Symptoms: (none) Name of Substance 1: marijuana 1 - Age of First Use: unknown 1 - Amount (size/oz): bowl 1 - Frequency: multiple times daily 1 - Duration: unknown 1 - Last Use / Amount: "a few days ago"                  Sleep: Fair  Appetite:  Fair  Current Medications: Current Facility-Administered Medications  Medication Dose Route Frequency Provider Last Rate Last Dose  . acetaminophen (TYLENOL) tablet 650 mg  650 mg Oral Q6H PRN Clapacs, [name], MD   650 mg at 05/12/17 1155  . alum & mag hydroxide-simeth (MAALOX/MYLANTA) 200-200-20 MG/5ML suspension 30 mL  30 mL Oral Q4H PRN Clapacs, [name], MD   30 mL at 05/15/17 1130  . cholecalciferol (VITAMIN D) tablet 2,000 Units  2,000 Units Oral Daily Haskell Riling, MD   2,000 Units at 05/16/17 0753  . clonazePAM (KLONOPIN) tablet 1 mg  1 mg Oral TID Haskell Riling, MD   1 mg at 05/16/17 1249  . cloNIDine (CATAPRES) tablet 0.1 mg  0.1 mg Oral BID PRN Odalis Jordan, [name], MD      . haloperidol (HALDOL) tablet 10 mg  10 mg Oral Q6H PRN Pucilowska, Jolanta B, MD   10 mg at 05/13/17 2227   Or  . haloperidol lactate (HALDOL) injection 10 mg  10 mg Intramuscular Q6H PRN Pucilowska, Jolanta B, MD      . HYDROcodone-acetaminophen (NORCO) 7.5-325 MG per tablet 1 tablet  1 tablet Oral Q6H PRN Clapacs, [name], MD   1 tablet at 05/12/17 2046  . hydrOXYzine (ATARAX/VISTARIL) tablet 50 mg  50 mg Oral TID PRN Clapacs, [name], MD   50 mg at 05/16/17 0655  . magnesium hydroxide (MILK OF MAGNESIA) suspension 30 mL  30 mL Oral Daily PRN Clapacs, [name], MD   30 mL at 05/16/17 1252  . neomycin-bacitracin-polymyxin (NEOSPORIN) ointment   Topical PRN [name], MD   1 application at 05/13/17 2227  . neomycin-bacitracin-polymyxin (NEOSPORIN) ointment    Topical Daily [name], MD   1 application at 05/14/17 0830  . OLANZapine (ZYPREXA) tablet 15 mg  15 mg Oral QHS [place], [name], MD       And  . [START ON 05/17/2017] OLANZapine (ZYPREXA) tablet 5 mg  5 mg Oral BH-q7a Sharon Rubis R, MD      . polyethylene glycol (MIRALAX / GLYCOLAX) packet 17 g  17 g Oral Daily [name], MD   17 g at 05/15/17 0830  . propranolol (INDERAL) tablet 10 mg  10 mg Oral Daily Ismaeel Arvelo, [name], MD   10 mg at 05/16/17 1245    Lab Results:  Results for orders placed or performed during the hospital encounter of 05/09/17 (  from the past 48 hour(s))  Parathyroid hormone, intact (no Ca)     Status: None   Collection Time: 05/14/17  3:42 PM  Result Value Ref Range   PTH 28 15 - 65 pg/mL    Comment: (NOTE) Performed At: [facility] [address] [place], [place] [id number] Jolene Schimke MD [id number] Performed at [facility], [address] [place], [place] [address]   VITAMIN D 25 Hydroxy (Vit-D Deficiency, Fractures)     Status: Abnormal   Collection Time: 05/14/17  3:42 PM  Result Value Ref Range   Vit D, 25-Hydroxy 21.5 (L) 30.0 - 100.0 ng/mL    Comment: (NOTE) Vitamin D deficiency has been defined by the Institute of Medicine and an Endocrine Society practice guideline as a level of serum 25-OH vitamin D less than 20 ng/mL (1,2). The Endocrine Society went on to further define vitamin D insufficiency as a level between 21 and 29 ng/mL (2). 1. IOM (Institute of Medicine). 2010. Dietary reference   intakes for calcium and D. [place]: The   [employer]. 2. Holick MF, Binkley Lincoln, Bischoff-Ferrari HA, et al.   Evaluation, treatment, and prevention of vitamin D   deficiency: an Endocrine Society clinical practice   guideline. JCEM. 2011 Jul; 96(7):1911-30. Performed At: [facility] [address] [place], [place] [id number] Jolene Schimke MD [id number] Performed at [facility],  [address] [place], [place] [address]     Blood Alcohol level:  Lab Results  Component Value Date   [facility] <10 05/08/2017    Metabolic Disorder Labs: Lab Results  Component Value Date   HGBA1C 4.8 05/10/2017   MPG 91.06 05/10/2017   No results found for: PROLACTIN Lab Results  Component Value Date   CHOL 162 05/10/2017   TRIG 70 05/10/2017   HDL 38 (L) 05/10/2017   CHOLHDL 4.3 05/10/2017   VLDL 14 05/10/2017   LDLCALC 110 (H) 05/10/2017   LDLCALC 112 (H) 11/13/2015    Physical Findings: AIMS:  , ,  ,  ,    CIWA:  CIWA-Ar Total: 0 COWS:  COWS Total Score: 0  Musculoskeletal: Strength & Muscle Tone: within normal limits Gait & Station: normal Patient leans: N/A  Psychiatric Specialty Exam: Physical Exam  Nursing note and vitals reviewed. NO muscle stiffness or ridgity on exam  Review of Systems  All other systems reviewed and are negative.   Blood pressure 134/81, pulse 88, temperature 98.2 F (36.8 C), temperature source Oral, resp. rate 18, height 5\' 9"  (1.753 m), weight 83 kg (183 lb), SpO2 100 %.Body mass index is 27.02 kg/m.  General Appearance: Casual  Eye Contact:  Good  Speech:  some stuttering  Volume:  Normal  Mood:  Euthymic  Affect:  Congruent  Thought Process:  Disorganized at times  Orientation:  Full (Time, Place, and Person)  Thought Content:  Illogical, delusions  Suicidal Thoughts:  No  Homicidal Thoughts:  No  Memory:  Immediate;   Poor  Judgement:  Impaired  Insight:  Lacking  Psychomotor Activity:  Normal  Concentration:  Concentration: Fair  Recall:  Poor  Fund of Knowledge:  Fair  Language:  Fair  Akathisia:  No      Assets:  Resilience  ADL's:  Intact  Cognition:  WNL  Sleep:  Number of Hours: 6     Treatment Plan Summary: 47 yo male admitted due to delusions. Pt is much calmer and has not been angry or aggressive. However, continues  to be very delusional. He has been medication compliant and medication work up has  been negative for any organic causes of fairly recent onset of psychosis. He did have some tachycardia the past few days and this morning but most recent set of vitals were normal. He is very anxious so this could explain tachycardia.   Plan:  Schizophrenia -Increase Zyprexa to 5 mg and 15 mg qhs -EKG normal sinus rhythm, QTc 393  Vitamin D deficiency -replace with 2000 units daily  Anxiety -On Klonopin 1 mg TID which he has been on for 4 years. We did discuss that risks of benzo's and opiates including death but he would like to continue Klonopin. He should taper down on this in the future.   Tachycardia -Resolved this afternoon -Will add Propranolol 10 mg daily for tachycardia and could help with anxiety  Dispo -He will return home when stable.   Haskell RilingHolly R Mikeisha Lemonds, MD 05/16/2017, 1:00 PM

## 2017-05-16 NOTE — [facility] Group Notes (Signed)
[facility] Group Notes:  (Nursing/MHT/Case Management/Adjunct)  Date:  05/16/2017  Time:  7:00 AM  Type of Therapy:  Psychoeducational Skills  Participation Level:  Active  Participation Quality:  Appropriate  Affect:  Appropriate  Cognitive:  Appropriate  Insight:  Appropriate  Engagement in Group:  Engaged  Modes of Intervention:  Discussion, Socialization and Support  Summary of Progress/Problems:  Chancy MilroyLaquanda Y Shae Hinnenkamp 05/16/2017, 7:00 AM

## 2017-05-16 NOTE — Progress Notes (Signed)
D- Patient alert and oriented. Patient presents in an anxious but pleasant mood on assessment stating that "I slept too good, I won't be requesting another sleeping pill". Patient rates his anxiety level a "5/10" stating that he's anxious because of "certain people". Patient denies SI, HI, AVH, at this time. Patient endorses a pain level of "3/10" stating it's his "chronic back pain". Patient's goal for today is "reducing pain in knees", which he intends on doing "whatever it takes, whatever the doctor says" in order to accomplish this goal.  A- Scheduled medications administered to patient, per MD orders. Support and encouragement provided.  Routine safety checks conducted every 15 minutes.  Patient informed to notify staff with problems or concerns.  R- No adverse drug reactions noted. Patient contracts for safety at this time. Patient compliant with medications and treatment plan. Patient receptive, calm, and cooperative. Patient interacts well with others on the unit.  Patient remains safe at this time.

## 2017-05-16 NOTE — Progress Notes (Signed)
Patient stayed in the milieu and was cooperative. Alert and oriented and denying thoughts of self harm. Denying hallucinations. Patient came to the dayroom and had a snack. Reported that he still feels depressed. Did not complain of pain. Patient stayed around until bedtime. Currently sleeping. No sign of distress. Safety maintained per Q 15mn level of observation.

## 2017-05-16 NOTE — Plan of Care (Signed)
Patient verbalizes understanding of the general information that's been provided to him and does not voice any further questions or concerns at this time. Patient denies SI/HI/AVH at this time. Patient does endorse an anxiety level of "5/10" because of "certain people" referring to another patient on the unit that has been bothering him. Patient has demonstrated self-control on the unit and has been compliant with medications thus far. Patient has been free from injury thus far and remains safe on the unit at this time. Patient stated that he slept "too good, I won't be requesting another sleeping pill". Patient has not attended any unit groups today.

## 2017-05-16 NOTE — Plan of Care (Signed)
Stayed in the milieu, was cooperative

## 2017-05-16 NOTE — Progress Notes (Signed)
Recreation Therapy Notes  Date: 01.25.2019  Time: 1:00 PM  Location: Craft Room  Behavioral response: N/A  Intervention Topic: Time Management  Discussion/Intervention: Patient did not attend group. Clinical Observations/Feedback:  Patient did not attend group.   Kaveri Perras LRT/CTRS         Faven Watterson 05/16/2017 2:35 PM

## 2017-05-17 DIAGNOSIS — F200 Paranoid schizophrenia: Secondary | ICD-10-CM

## 2017-05-17 MED ORDER — HALOPERIDOL 2 MG PO TABS
5.0000 mg | ORAL_TABLET | Freq: Four times a day (QID) | ORAL | Status: DC | PRN
  Administered 2017-05-18 – 2017-05-23 (×7): 5 mg via ORAL
  Filled 2017-05-17: qty 2.5
  Filled 2017-05-17 (×2): qty 1
  Filled 2017-05-17: qty 3
  Filled 2017-05-17 (×4): qty 1

## 2017-05-17 MED ORDER — HALOPERIDOL LACTATE 5 MG/ML IJ SOLN: 5.0000 mg | Freq: Four times a day (QID) | INTRAMUSCULAR | Status: DC | PRN

## 2017-05-17 NOTE — Progress Notes (Signed)
D:Pt denies SI/HI/AVH. Pt. States he feels, "worse" today then he did yesterday, but at times changes his opinion on how he feels about the day and will sometimes report feeling, "good" today. Pt. States he can remain safe on the unit. Pt verbally contracts for safety. Pt. Reports improved pain control this evening. Pt. Able to demonstrate more appropriate behavior on the unit this evening with peers during snack and groups. Pt. Reports, "I eat till I'm all filled up, I'm eatin' good". Pt. Able to remain free from injury on the unit this evening. Pt. Sleep reports state he is sleeping improved, but is unable to clearly articulate if he is sleeping good or not. Pt. States he feels, "worse" today then he did yesterday, but at times changes his opinion on how he feels about the day and will sometimes report feeling, "good" today. Pt. This evening presents with psychotic symptoms and can be very: tangential, utilizing word salad, utilizing odd paranoid behaviors at times, have flights of ideas, delusions, and derealization moments. Pt. Presents with psychotic symptoms frequently, but for the most part is pleasant and engages, attempting appropriate normal conversation. Pt. At times is logical/coherent. Pt. This evening made comments such as, "when I was in the jail I could see this guy using the phone and then I went to use it and I could feel the Fentanyl hit my face and instantly I felt the radio communication coming into my brain, and the Fentanyl project they said I'm a perfect candidate and they can put the indwelling line in my ass, I think they did". Pt. In these moments can be redirect to appropriate conversation at times. Pt. 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 Pt. A majority of the time is able to remain calm this evening.     A: Q x 15 minute observation checks were completed for safety. Patient was  provided with education.Pt. Needs reinforcement on provided education. Patient was given scheduled/PRN medications. Patient  was encourage to attend groups, participate in unit activities and continue with plan of care.   R:Patient is complaint with medication and unit procedures and attends groups.            Patient slept for Estimated Hours of 7.30; Precautionary checks every 15 minutes for safety maintained, room free of safety hazards, patient sustains no injury or falls during this shift.

## 2017-05-17 NOTE — [facility] Group Notes (Signed)
LCSW Group Therapy Note   05/17/2017 1:15pm   Type of Therapy and Topic:  Group Therapy:  Trust and Honesty  Participation Level:  Minimal  Description of Group:    In this group patients will be asked to explore the value of being honest.  Patients will be guided to discuss their thoughts, feelings, and behaviors related to honesty and trusting in others. Patients will process together how trust and honesty relate to forming relationships with peers, family members, and self. Each patient will be challenged to identify and express feelings of being vulnerable. Patients will discuss reasons why people are dishonest and identify alternative outcomes if one was truthful (to self or others). This group will be process-oriented, with patients participating in exploration of their own experiences, giving and receiving support, and processing challenge from other group members.   Therapeutic Goals: 1. Patient will identify why honesty is important to relationships and how honesty overall affects relationships.  2. Patient will identify a situation where they lied or were lied too and the  feelings, thought process, and behaviors surrounding the situation 3. Patient will identify the meaning of being vulnerable, how that feels, and how that correlates to being honest with self and others. 4. Patient will identify situations where they could have told the truth, but instead lied and explain reasons of dishonesty.   Summary of Patient Progress: Pt scored his mood 5 (10 best). He shared he is ready to see his family.     Therapeutic Modalities:   Cognitive Behavioral Therapy Solution Focused Therapy Motivational Interviewing Brief Therapy  Raygan Skarda  CUEBAS-COLON, LCSW 05/17/2017 1:01 PM

## 2017-05-17 NOTE — Plan of Care (Signed)
Pt. Reports improved pain control this evening. Pt. Denies pain this evening. Pt. Reports feeling, "better" today then he did yesterday. Pt. Observed utilizing improved self-control around the unit with peers. Pt. Compliant with unit procedures and medications. Pt. Observed with less psychotic symptoms this evening.  Pt. Reports eating, "good". Pt. Reports he can remain safe while on the unit. Pt. Denies SI/HI. Pt. Verbally contracts for safety. Pt. Participates in groups. Pt. Reports sleeping, "good". Pt. Needs reinforcement of provided education.

## 2017-05-17 NOTE — Progress Notes (Signed)
[facility] MD Progress Note  05/17/2017 1:56 PM David Reeves  MRN:  [id number]   Subjective:  Patient endorses that other patients are creating "oxidative stress" for him. He says they do this by creating names that they call him and by making fun of. Was confined to his room mostly today. Endorses he does not know how he gets Ativan and Percocet here. Says that several strange things happening in the hospital. Endorses his mother has passed that his grandmother has passed. He only people he cares about others wife and daughter who live in [place]. When I ask him how we can help with, he asked me to help him content with the people stressing him out. Denies any suicidal thoughts thoughts about dying or safety issues.  Principal Problem: Schizophrenia (HCC) Diagnosis:   Patient Active Problem List   Diagnosis Date Noted  . Schizophrenia (HCC) [F20.9] 05/14/2017  . Laceration of right hand without foreign body [S61.411A]   . Cannabis use disorder, moderate, dependence (HCC) [F12.20] 05/09/2017  . Closed compression fracture of L1 lumbar vertebra (HCC) [S32.010A] 05/24/2016  . Lumbar stenosis with neurogenic claudication [M48.062] 11/01/2015  . Chronic 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 [M54.41, G89.29] 11/08/2014  . Hx of hemorrhoids [Z87.19] 11/08/2014  . AAA (abdominal aortic aneurysm) (HCC) [I71.4] 09/22/2014  . Chronic pain associated with significant psychosocial dysfunction [G89.4] 09/22/2014  . Panic attack [F41.0] 09/22/2014  . AB (asthmatic bronchitis) [J45.909] 08/17/2014  . 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 Backache [M54.9] 08/17/2014  . Lumbosacral spondylosis without myelopathy [M47.817] 08/17/2014  . Disorder of male genital organs [N50.9] 08/17/2014  . Brash [R12] 08/17/2014  . Low back pain [M54.5] 08/17/2014  . Tendon nodule [M67.90] 08/17/2014  . 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 Hernia,  inguinal, right [K40.90] 08/17/2014  . Fast heart beat [R00.0] 08/17/2014  . Illness [R69] 08/17/2014  . Inguinal hernia [K40.90] 10/14/2012   Total Time spent with patient: 30 minutes  Past Psychiatric History: See H&P  Past Medical History:  Past Medical History:  Diagnosis Date  . Back pain 2014  . Bone spur   . Bulging disc 2014  . Degenerative disc disease   . Osteoarthritis   . Panic anxiety syndrome   . Taking multiple medications for chronic disease     Past Surgical History:  Procedure Laterality Date  . HERNIA REPAIR Left 2006,2014   [facility]  . TOE SURGERY Right 2007   Family History:  Family History  Problem Relation Age of Onset  . Diabetes Mother   . Diabetes Maternal Uncle   . Diabetes Maternal Grandmother   . Heart disease Maternal Grandmother    Family Psychiatric  History: See H&P Social History:  Social History   Substance and Sexual Activity  Alcohol Use No     Social History   Substance and Sexual Activity  Drug Use No    Social History   Socioeconomic History  . Marital status: Married    Spouse name: None  . Number of children: None  . Years of education: None  . Highest education level: None  Social Needs  . Financial resource strain: None  . Food insecurity - worry: None  . Food insecurity - inability: None  . Transportation needs - medical: None  . Transportation needs - non-medical: None  Occupational History  . None  Tobacco Use  . Smoking status: Never Smoker  . Smokeless tobacco: Never Used  Substance and  Sexual Activity  . Alcohol use: No  . Drug use: No  . Sexual activity: None  Other Topics Concern  . None  Social History Narrative  . None   Additional Social History:    Pain Medications: Vicodin Prescriptions: Vicodin, Klonopin History of alcohol / drug use?: Yes Longest period of sobriety (when/how long): Unknown Negative Consequences of Use: Personal relationships, Financial Withdrawal Symptoms:  (none) Name of Substance 1: marijuana 1 - Age of First Use: unknown 1 - Amount (size/oz): bowl 1 - Frequency: multiple times daily 1 - Duration: unknown 1 - Last Use / Amount: "a few days ago"                  Sleep: Fair  Appetite:  Fair  Current Medications: Current Facility-Administered Medications  Medication Dose Route Frequency Provider Last Rate Last Dose  . acetaminophen (TYLENOL) tablet 650 mg  650 mg Oral Q6H PRN Clapacs, Jackquline DenmarkJohn T, MD   650 mg at 05/17/17 0803  . alum & mag hydroxide-simeth (MAALOX/MYLANTA) 200-200-20 MG/5ML suspension 30 mL  30 mL Oral Q4H PRN Clapacs, [name], MD   30 mL at 05/16/17 1547  . cholecalciferol (VITAMIN D) tablet 2,000 Units  2,000 Units Oral Daily Haskell RilingMcNew, Holly R, MD   2,000 Units at 05/17/17 [phone]  . clonazePAM (KLONOPIN) tablet 1 mg  1 mg Oral TID Haskell RilingMcNew, Holly R, MD   1 mg at 05/17/17 1235  . cloNIDine (CATAPRES) tablet 0.1 mg  0.1 mg Oral BID PRN [name], Ileene HutchinsonHolly R, MD      . haloperidol (HALDOL) tablet 10 mg  10 mg Oral Q6H PRN Pucilowska, Jolanta B, MD   10 mg at 05/16/17 2119   Or  . haloperidol lactate (HALDOL) injection 10 mg  10 mg Intramuscular Q6H PRN Pucilowska, Jolanta B, MD      . HYDROcodone-acetaminophen (NORCO) 7.5-325 MG per tablet 1 tablet  1 tablet Oral Q6H PRN Clapacs, Jackquline DenmarkJohn T, MD   1 tablet at 05/16/17 1744  . hydrOXYzine (ATARAX/VISTARIL) tablet 50 mg  50 mg Oral TID PRN Clapacs, Jackquline DenmarkJohn T, MD   50 mg at 05/16/17 2119  . magnesium hydroxide (MILK OF MAGNESIA) suspension 30 mL  30 mL Oral Daily PRN Clapacs, Jackquline DenmarkJohn T, MD   30 mL at 05/16/17 1252  . neomycin-bacitracin-polymyxin (NEOSPORIN) ointment   Topical PRN Beverly SessionsSubedi, Jagannath, MD   1 application at 05/13/17 2227  . neomycin-bacitracin-polymyxin (NEOSPORIN) ointment   Topical Daily Henrene DodgePiscoya, Jose, MD   1 application at 05/17/17 [phone]  . OLANZapine (ZYPREXA) tablet 15 mg  15 mg Oral QHS [name], Ileene HutchinsonHolly R, MD   15 mg at 05/16/17 2109   And  . OLANZapine (ZYPREXA) tablet 5 mg  5 mg Oral  BH-q7a [name], Ileene HutchinsonHolly R, MD   5 mg at 05/17/17 0603  . polyethylene glycol (MIRALAX / GLYCOLAX) packet 17 g  17 g Oral Daily Beverly SessionsSubedi, Jagannath, MD   17 g at 05/17/17 0806  . propranolol (INDERAL) tablet 10 mg  10 mg Oral Daily [name], Ileene HutchinsonHolly R, MD   10 mg at 05/17/17 0805    Lab Results:  No results found for this or any previous visit (from the past 48 hour(s)).  Blood Alcohol level:  Lab Results  Component Value Date   ETH <10 05/08/2017    Metabolic Disorder Labs: Lab Results  Component Value Date   HGBA1C 4.8 05/10/2017   MPG 91.06 05/10/2017   No results found for: PROLACTIN Lab Results  Component Value  Date   CHOL 162 05/10/2017   TRIG 70 05/10/2017   HDL 38 (L) 05/10/2017   CHOLHDL 4.3 05/10/2017   VLDL 14 05/10/2017   LDLCALC 110 (H) 05/10/2017   LDLCALC 112 (H) 11/13/2015    Physical Findings: AIMS: Facial and Oral Movements Muscles of Facial Expression: None, normal Lips and Perioral Area: None, normal Jaw: None, normal Tongue: None, normal,Extremity Movements Upper (arms, wrists, hands, fingers): None, normal Lower (legs, knees, ankles, toes): None, normal, Trunk Movements Neck, shoulders, hips: None, normal, Overall Severity Severity of abnormal movements (highest score from questions above): None, normal Incapacitation due to abnormal movements: None, normal Patient's awareness of abnormal movements (rate only patient's report): No Awareness, Dental Status Current problems with teeth and/or dentures?: No Does patient usually wear dentures?: No  CIWA:  CIWA-Ar Total: 0 COWS:  COWS Total Score: 0  Musculoskeletal: Strength & Muscle Tone: within normal limits Gait & Station: normal Patient leans: N/A  Psychiatric Specialty Exam: Physical Exam  Nursing note and vitals reviewed. NO muscle stiffness or ridgity on exam  Review of Systems  All other systems reviewed and are negative.   Blood pressure 119/87, pulse 87, temperature 98.2 F (36.8 C),  temperature source Oral, resp. rate 18, height 5\' 9"  (1.753 m), weight 183 lb (83 kg), SpO2 100 %.Body mass index is 27.02 kg/m.  General Appearance: Casual  Eye Contact:  Good  Speech:  some stuttering  Volume:  Normal  Mood:  Euthymic  Affect:  Congruent  Thought Process:  Disorganized at times  Orientation:  Full (Time, Place, and Person)  Thought Content:  Illogical, delusions  Suicidal Thoughts:  No  Homicidal Thoughts:  No  Memory:  Immediate;   Poor  Judgement:  Impaired  Insight:  Lacking  Psychomotor Activity:  Normal  Concentration:  Concentration: Fair  Recall:  Poor  Fund of Knowledge:  Fair  Language:  Fair  Akathisia:  No      Assets:  Resilience  ADL's:  Intact  Cognition:  WNL  Sleep:  Number of Hours: 6     Treatment Plan Summary: 47 yo male admitted due to delusions. Pt is much calmer and has not been angry or aggressive. However, continues to be very delusional. He has been medication compliant and medication work up has been negative for any organic causes of fairly recent onset of psychosis. He did have some tachycardia the past few days and this morning but most recent set of vitals were normal. He is very anxious so this could explain tachycardia.   Plan:  Schizophrenia -Increase Zyprexa to 5 mg QAM and 15 mg qhs -EKG normal sinus rhythm, QTc 393 -Since patient takes clonazepam 1 mg 3 times daily, plan to continue Haldol 5 mg every 6 hours as needed for agitation. If patient refuses to take by mouth can be given IM.  Vitamin D deficiency -replace with 2000 units daily  Anxiety -On Klonopin 1 mg TID which he has been on for 4 years. We did discuss that risks of benzo's and opiates including death but he would like to continue Klonopin. He should taper down on this in the future. -Hydroxyzine 50 mg 3 times a day as needed   Tachycardia -Resolved on 05/16/17 -Continue Propranolol 10 mg daily for tachycardia and could help with anxiety  Dispo -He  will return home when stable.   Aundria Rud, MD 05/17/2017, 1:56 PM

## 2017-05-17 NOTE — Progress Notes (Signed)
D: Pt denies SI, HI and AVH when assessed. Remains guarded and paranoid, believes peers are targeting him "they are giving me oxidative stress, calling me names and making fun of me". However, pt has been isolative to his room majority of this shift, out of bed for medications and meals only, interacts with staff on as need basis. Irritable and demanding earlier this shift, pulling on call light in bathroom and getting back in bed, Per pt "my medications not working right now, I need something else" approximately 15 minutes after taking his medications. Rates his depression 4/10 and anxiety 5/10. A: Scheduled and PRN medications given as ordered with verbal education and effects monitored. Emotional support and availability provided to pt. Encouraged pt to attend to his ADLs, voice concerns and comply with treatment regimen as ordered. Safety checks maintained at Q 15 minutes intervals.  R: Pt remains medication compliant when he's showned his medications and allowed to open them. Did not attend groups as scheduled despite multiple prompts. Tolerates all PO intake well. Remains safe on unit.

## 2017-05-17 NOTE — Progress Notes (Signed)
D:Pt denies SI/HI/AVH. Pt. Reports he can remain safe while on the unit. Pt. Verbally contracts for safety.  Pt is pleasant and cooperative with this writer, but at times can be tangential, utilizing loose association with words, labile, and paranoid. Pt. A majority of the time can be logical/coherent and engaging in conversation during assessment. Pt. Presents also with a more appropriate/flat facial expression compared to previous shift assessments. Pt. has no Complaints this evening. Pt. Reports eating, "good". Pt. Reports feeling, "better" today then he did yesterday.  Pt. Reports sleeping, "good". Pt. Observed with less psychotic symptoms this evening. Pt. Observed utilizing improved self-control around the unit with peers. Pt. Reports improved pain control this evening. Pt. Denies pain this evening.  A: Q x 15 minute observation checks were completed for safety. Patient was provided with education.Pt. Needs reinforcement of provided education. Patient was given scheduled medications. Patient  was encourage to attend groups, participate in unit activities and continue with plan of care.   R:Patient is complaint with medication and unit procedures. Pt. Participates in groups.             Patient slept for Estimated Hours of 5.30; Precautionary checks every 15 minutes for safety maintained, room free of safety hazards, patient sustains no injury or falls during this shift.

## 2017-05-18 DIAGNOSIS — F200 Paranoid schizophrenia: Secondary | ICD-10-CM

## 2017-05-18 NOTE — Progress Notes (Signed)
D:Pt denies SI/HI/AVH. Pt. Verbally contracts for safety. Pt. Reports he can remain safe while on the unit. Pt is pleasant and cooperative during assessment, but expressed he felt his agitations and anxiety ramping up and requested oral PRN medications. Oral PRN medications appeared to relieve symptoms expressed. Pt. reports anxiety and agitations at a 3 or 4 for both.  Patient Interaction is engaging and remained logical for our short conversations.Pt. Reports improved pain control this evening. Pt. Denies pain this evening. Pt. Reports feeling, "better" today then he did yesterday, but also states, "my best days were the 23rd and the 24th", but did not clarify why. Pt. Observed utilizing improved self-control around the unit with peers. Pt. Observed with less psychotic symptoms this evening. Pt. Reports eating, "good". Pt. Reports sleeping, "good".   A: Q x 15 minute observation checks were completed for safety. Patient was provided with education. Pt. Needs reinforcement of provided education. Patient was given scheduled/PRN medications. Patient  was encourage to attend groups, participate in unit activities and continue with plan of care.   R:Patient is complaint with medication and unit procedures. Pt. Participates in groups.            Patient slept for Estimated Hours of 6.45; Precautionary checks every 15 minutes for safety maintained, room free of safety hazards, patient sustains no injury or falls during this shift.

## 2017-05-18 NOTE — [facility] Group Notes (Signed)
[facility] Group Notes:  (Nursing/MHT/Case Management/Adjunct)  Date:  11/20/24/2019 Time:  8:46 PM  Type of Therapy:  Psychoeducational Skills  Participation Level:  Active  Participation Quality:  Appropriate and Attentive  Affect:  Appropriate  Cognitive:  Appropriate  Insight:  Appropriate and Good  Engagement in Group:  Engaged  Modes of Intervention:  Discussion, Socialization and Support  Summary of Progress/Problems:  Chancy MilroyLaquanda Y Liliah Dorian 05/18/2017, 8:20 PM

## 2017-05-18 NOTE — Plan of Care (Signed)
Pt. Reports improved pain control this evening. Pt. Denies pain this evening. Pt. Reports feeling, "better" today then he did yesterday, but also states, "my best days were the 23rd and the 24th". Pt. Observed utilizing improved self-control around the unit with peers. Pt. Compliant with unit procedures and medications. Pt. Observed with less psychotic symptoms this evening.  Pt. Reports eating, "good". Pt. Reports he can remain safe while on the unit. Pt. Denies SI/HI. Pt. Verbally contracts for safety. Pt. Participates in groups. Pt. Reports sleeping, "good". Pt. Needs reinforcement of provided education.   Progressing Pain Managment: General experience of comfort will improve 05/18/2017 2249 - Progressing by Lenox PondsStevens, Graig Hessling J, RN 05/18/2017 2248 - Progressing by Lenox PondsStevens, Mainor Hellmann J, RN Education: Emotional status will improve 05/18/2017 2249 - Progressing by Lenox PondsStevens, Kenon Delashmit J, RN 05/18/2017 2248 - Progressing by Lenox PondsStevens, Ozelle Brubacher J, RN Mental status will improve 05/18/2017 2249 - Progressing by Lenox PondsStevens, Eleazar Kimmey J, RN 05/18/2017 2248 - Progressing by Lenox PondsStevens, Kismet Facemire J, RN Verbalization of understanding the information provided will improve 05/18/2017 2249 - Progressing by Lenox PondsStevens, Jossilyn Benda J, RN Coping: Ability to demonstrate self-control will improve 05/18/2017 2249 - Progressing by Lenox PondsStevens, Michai Dieppa J, RN 05/18/2017 2248 - Progressing by Lenox PondsStevens, Cambre Matson J, RN Health Behavior/Discharge Planning: Compliance with treatment plan for underlying cause of condition will improve 05/18/2017 2249 - Progressing by Lenox PondsStevens, Dahlton Hinde J, RN 05/18/2017 2248 - Progressing by Lenox PondsStevens, Chonte Ricke J, RN Education: Will be free of psychotic symptoms 05/18/2017 2249 - Progressing by Lenox PondsStevens, Laakea Pereira J, RN 05/18/2017 2248 - Progressing by Lenox PondsStevens, Janeann Paisley J, RN Nutritional: Ability to achieve adequate nutritional intake will improve 05/18/2017 2249 - Progressing by Lenox PondsStevens, Rollin Kotowski J, RN 05/18/2017 2248 - Progressing by Lenox PondsStevens, Pierce Biagini J,  RN Safety: Ability to redirect hostility and anger into socially appropriate behaviors will improve 05/18/2017 2249 - Progressing by Lenox PondsStevens, Samhita Kretsch J, RN 05/18/2017 2248 - Progressing by Lenox PondsStevens, Ayub Kirsh J, RN Ability to remain free from injury will improve 05/18/2017 2249 - Progressing by Lenox PondsStevens, Joory Gough J, RN 05/18/2017 2248 - Progressing by Lenox PondsStevens, Roshonda Sperl J, RN Self-Care: Ability to participate in self-care as condition permits will improve 05/18/2017 2249 - Progressing by Lenox PondsStevens, Tanesha Arambula J, RN 05/18/2017 2248 - Progressing by Lenox PondsStevens, Kevyn Boquet J, RN [facility] in Recreation Therapeutic Interventions STG-Other Recreation Therapy Goal (Specify) Description Patient will attend group in a calm and appropriate manner with no impulsive behaviors x5 days.  05/18/2017 2249 - Progressing by Lenox PondsStevens, Dewight Catino J, RN 05/18/2017 2248 - Progressing by Lenox PondsStevens, Raphael Fitzpatrick J, RN Activity: Sleeping patterns will improve 05/18/2017 2249 - Progressing by Lenox PondsStevens, Darnette Lampron J, RN 05/18/2017 2248 - Progressing by Lenox PondsStevens, Melia Hopes J, RN   Not Progressing Education: Knowledge of [facility] Education information/materials will improve 05/18/2017 2249 - Not Progressing by Lenox PondsStevens, Avneet Ashmore J, RN 05/18/2017 2248 - Progressing by Lenox PondsStevens, Gyanna Jarema J, RN

## 2017-05-18 NOTE — [facility] Group Notes (Signed)
LCSW Group Therapy Note 05/18/2017 1:15pm  Type of Therapy and Topic: Group Therapy: Feelings Around Returning Home & Establishing a Supportive Framework and Supporting Oneself When Supports Not Available  Participation Level: Did Not Attend  Description of Group:  Patients first processed thoughts and feelings about upcoming discharge. These included fears of upcoming changes, lack of change, new living environments, judgements and expectations from others and overall stigma of mental health issues. The group then discussed the definition of a supportive framework, what that looks and feels like, and how do to discern it from an unhealthy non-supportive network. The group identified different types of supports as well as what to do when your family/friends are less than helpful or unavailable  Therapeutic Goals  1. Patient will identify one healthy supportive network that they can use at discharge. 2. Patient will identify one factor of a supportive framework and how to tell it from an unhealthy network. 3. Patient able to identify one coping skill to use when they do not have positive supports from others. 4. Patient will demonstrate ability to communicate their needs through discussion and/or role plays.  Summary of Patient Progress:   Therapeutic Modalities Cognitive Behavioral Therapy Motivational Interviewing   Hazim Treadway  CUEBAS-COLON, LCSW 05/18/2017 1:08 PM 

## 2017-05-18 NOTE — Progress Notes (Signed)
[facility] MD Progress Note  05/18/2017 3:16 PM David Reeves  MRN:  [id number]   Subjective:  Patient doing well on the unit. Hallucinogens to me as to how he has been searching for pain clinic doctor who will provide him PCA pump for his pain. Smokes marijuana, says this caused him to be rejected at the [facility] pain clinic. Also talks about how he has tried various pain clinics and has not been successful in finding a doctor who can do it. Endorses the patient continued to bother him on the unit but he has been able to cope with it much better. Denies any side effects from medications.  Principal Problem: Schizophrenia (HCC) Diagnosis:   Patient Active Problem List   Diagnosis Date Noted  . Schizophrenia (HCC) [F20.9] 05/14/2017  . Laceration of right hand without foreign body [S61.411A]   . Cannabis use disorder, moderate, dependence (HCC) [F12.20] 05/09/2017  . Closed compression fracture of L1 lumbar vertebra (HCC) [S32.010A] 05/24/2016  . Lumbar stenosis with neurogenic claudication [M48.062] 11/01/2015  . Chronic 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 [M54.41, G89.29] 11/08/2014  . Hx of hemorrhoids [Z87.19] 11/08/2014  . AAA (abdominal aortic aneurysm) (HCC) [I71.4] 09/22/2014  . Chronic pain associated with significant psychosocial dysfunction [G89.4] 09/22/2014  . Panic attack [F41.0] 09/22/2014  . AB (asthmatic bronchitis) [J45.909] 08/17/2014  . 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 Backache [M54.9] 08/17/2014  . Lumbosacral spondylosis without myelopathy [M47.817] 08/17/2014  . Disorder of male genital organs [N50.9] 08/17/2014  . Brash [R12] 08/17/2014  . Low back pain [M54.5] 08/17/2014  . Tendon nodule [M67.90] 08/17/2014  . 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 Hernia, inguinal, right [K40.90] 08/17/2014  . Fast heart beat [R00.0] 08/17/2014  . Illness [R69] 08/17/2014  . Inguinal hernia [K40.90]  10/14/2012   Total Time spent with patient: 30 minutes  Past Psychiatric History: See H&P  Past Medical History:  Past Medical History:  Diagnosis Date  . Back pain 2014  . Bone spur   . Bulging disc 2014  . Degenerative disc disease   . Osteoarthritis   . Panic anxiety syndrome   . Taking multiple medications for chronic disease     Past Surgical History:  Procedure Laterality Date  . HERNIA REPAIR Left 2006,2014   [facility]  . TOE SURGERY Right 2007   Family History:  Family History  Problem Relation Age of Onset  . Diabetes Mother   . Diabetes Maternal Uncle   . Diabetes Maternal Grandmother   . Heart disease Maternal Grandmother    Family Psychiatric  History: See H&P Social History:  Social History   Substance and Sexual Activity  Alcohol Use No     Social History   Substance and Sexual Activity  Drug Use No    Social History   Socioeconomic History  . Marital status: Married    Spouse name: None  . Number of children: None  . Years of education: None  . Highest education level: None  Social Needs  . Financial resource strain: None  . Food insecurity - worry: None  . Food insecurity - inability: None  . Transportation needs - medical: None  . Transportation needs - non-medical: None  Occupational History  . None  Tobacco Use  . Smoking status: Never Smoker  . Smokeless tobacco: Never Used  Substance and Sexual Activity  . Alcohol use: No  . Drug use: No  . Sexual activity: None  Other  Topics Concern  . None  Social History Narrative  . None   Additional Social History:    Pain Medications: Vicodin Prescriptions: Vicodin, Klonopin History of alcohol / drug use?: Yes Longest period of sobriety (when/how long): Unknown Negative Consequences of Use: Personal relationships, Financial Withdrawal Symptoms: (none) Name of Substance 1: marijuana 1 - Age of First Use: unknown 1 - Amount (size/oz): bowl 1 - Frequency: multiple times daily 1 -  Duration: unknown 1 - Last Use / Amount: "a few days ago"                  Sleep: Fair  Appetite:  Fair  Current Medications: Current Facility-Administered Medications  Medication Dose Route Frequency Provider Last Rate Last Dose  . acetaminophen (TYLENOL) tablet 650 mg  650 mg Oral Q6H PRN Clapacs, Jackquline DenmarkJohn T, MD   650 mg at 05/18/17 0801  . alum & mag hydroxide-simeth (MAALOX/MYLANTA) 200-200-20 MG/5ML suspension 30 mL  30 mL Oral Q4H PRN Clapacs, [name], MD   30 mL at 05/16/17 1547  . cholecalciferol (VITAMIN D) tablet 2,000 Units  2,000 Units Oral Daily Haskell RilingMcNew, Holly R, MD   2,000 Units at 05/18/17 0759  . clonazePAM (KLONOPIN) tablet 1 mg  1 mg Oral TID Haskell RilingMcNew, Holly R, MD   1 mg at 05/18/17 1211  . cloNIDine (CATAPRES) tablet 0.1 mg  0.1 mg Oral BID PRN [name], Ileene HutchinsonHolly R, MD      . haloperidol (HALDOL) tablet 5 mg  5 mg Oral Q6H PRN Aundria Rudakesh, Jeury Mcnab, MD       Or  . haloperidol lactate (HALDOL) injection 5 mg  5 mg Intramuscular Q6H PRN Aundria Rudakesh, Arlene Brickel, MD      . HYDROcodone-acetaminophen (NORCO) 7.5-325 MG per tablet 1 tablet  1 tablet Oral Q6H PRN Clapacs, Jackquline DenmarkJohn T, MD   1 tablet at 05/18/17 1211  . hydrOXYzine (ATARAX/VISTARIL) tablet 50 mg  50 mg Oral TID PRN Clapacs, Jackquline DenmarkJohn T, MD   50 mg at 05/18/17 [id number]  . magnesium hydroxide (MILK OF MAGNESIA) suspension 30 mL  30 mL Oral Daily PRN Clapacs, Jackquline DenmarkJohn T, MD   30 mL at 05/16/17 1252  . neomycin-bacitracin-polymyxin (NEOSPORIN) ointment   Topical PRN Beverly SessionsSubedi, Jagannath, MD   1 application at 05/13/17 2227  . neomycin-bacitracin-polymyxin (NEOSPORIN) ointment   Topical Daily Henrene DodgePiscoya, Jose, MD   1 application at 05/17/17 [phone]  . OLANZapine (ZYPREXA) tablet 15 mg  15 mg Oral QHS [name], Ileene HutchinsonHolly R, MD   15 mg at 05/17/17 2104   And  . OLANZapine (ZYPREXA) tablet 5 mg  5 mg Oral BH-q7a [name], Ileene HutchinsonHolly R, MD   5 mg at 05/18/17 [id number]  . polyethylene glycol (MIRALAX / GLYCOLAX) packet 17 g  17 g Oral Daily Beverly SessionsSubedi, Jagannath, MD   17 g at 05/18/17 0800   . propranolol (INDERAL) tablet 10 mg  10 mg Oral Daily [name], Ileene HutchinsonHolly R, MD   10 mg at 05/18/17 [id number]    Lab Results:  No results found for this or any previous visit (from the past 48 hour(s)).  Blood Alcohol level:  Lab Results  Component Value Date   ETH <10 05/08/2017    Metabolic Disorder Labs: Lab Results  Component Value Date   HGBA1C 4.8 05/10/2017   MPG 91.06 05/10/2017   No results found for: PROLACTIN Lab Results  Component Value Date   CHOL 162 05/10/2017   TRIG 70 05/10/2017   HDL 38 (L) 05/10/2017   CHOLHDL 4.3 05/10/2017  VLDL 14 05/10/2017   LDLCALC 110 (H) 05/10/2017   LDLCALC 112 (H) 11/13/2015    Physical Findings: AIMS: Facial and Oral Movements Muscles of Facial Expression: None, normal Lips and Perioral Area: None, normal Jaw: None, normal Tongue: None, normal,Extremity Movements Upper (arms, wrists, hands, fingers): None, normal Lower (legs, knees, ankles, toes): None, normal, Trunk Movements Neck, shoulders, hips: None, normal, Overall Severity Severity of abnormal movements (highest score from questions above): None, normal Incapacitation due to abnormal movements: None, normal Patient's awareness of abnormal movements (rate only patient's report): No Awareness, Dental Status Current problems with teeth and/or dentures?: No Does patient usually wear dentures?: No  CIWA:  CIWA-Ar Total: 0 COWS:  COWS Total Score: 0  Musculoskeletal: Strength & Muscle Tone: within normal limits Gait & Station: normal Patient leans: N/A  Psychiatric Specialty Exam: Physical Exam  Nursing note and vitals reviewed. NO muscle stiffness or ridgity on exam  Review of Systems  All other systems reviewed and are negative.   Blood pressure 126/83, pulse 85, temperature 98.2 F (36.8 C), temperature source Oral, resp. rate 18, height 5\' 9"  (1.753 m), weight 183 lb (83 kg), SpO2 100 %.Body mass index is 27.02 kg/m.  General Appearance: Casual  Eye Contact:   Good  Speech:  some stuttering  Volume:  Normal  Mood:  Euthymic  Affect:  Congruent  Thought Process: Much more organized than before   Orientation:  Full (Time, Place, and Person)  Thought Content:  Continues to have delusional persecution   Suicidal Thoughts:  No  Homicidal Thoughts:  No  Memory:  Immediate;   Poor  Judgement:  Impaired  Insight:  Lacking  Psychomotor Activity:  Normal  Concentration:  Concentration: Fair  Recall:  Poor  Fund of Knowledge:  Fair  Language:  Fair  Akathisia:  No      Assets:  Resilience  ADL's:  Intact  Cognition:  WNL  Sleep:  Number of Hours: 6     Treatment Plan Summary: 47 yo male admitted due to delusions. Pt is much calmer and has not been angry or aggressive. However, continues to be very delusional. He has been medication compliant and medication work up has been negative for any organic causes of fairly recent onset of psychosis. Has been doing much better on the unit since the last few days.  Plan:  Schizophrenia -Increase Zyprexa to 5 mg QAM and 15 mg qhs -EKG normal sinus rhythm, QTc 393 -Since patient takes clonazepam 1 mg 3 times daily, plan to continue Haldol 5 mg every 6 hours as needed for agitation. If patient refuses to take by mouth can be given IM.  Vitamin D deficiency -replace with 2000 units daily  Anxiety -On Klonopin 1 mg TID which he has been on for 4 years. We did discuss that risks of benzo's and opiates including death but he would like to continue Klonopin. He should taper down on this in the future. -Hydroxyzine 50 mg 3 times a day as needed   Tachycardia -Resolved on 05/16/17 -Continue Propranolol 10 mg daily for tachycardia and could help with anxiety  Dispo -He will return home when stable.   Aundria Rud, MD 05/18/2017, 3:16 PM

## 2017-05-18 NOTE — Progress Notes (Signed)
D- Patient alert and oriented. Patient presents in a pleasant mood stating that he slept well last night but is feeling anxious this morning rating his anxiety level a "4.5/10" stating that "certain people and large crowds" make him anxious. Patient is preoccupied with his medication list as well as taking something for his chronic lower back pain by stating "I need to know everything that I've taken while in this facility, and I need my pain medication so that I can go to group". Patient denies SI, HI, AVH, at this time. Patient's goal for today is "going home to wife & child". Patient will accomplish this goal by "whatever doctor says, rest, recover".  A- Scheduled medications administered to patient, per MD orders. Support and encouragement provided.  Routine safety checks conducted every 15 minutes.  Patient informed to notify staff with problems or concerns.  R- No adverse drug reactions noted. Patient contracts for safety at this time. Patient compliant with medications and treatment plan. Patient receptive, calm, and cooperative. Patient interacts well with others on the unit.  Patient remains safe at this time.

## 2017-05-18 NOTE — [facility] Group Notes (Signed)
[facility] Group Notes:  (Nursing/MHT/Case Management/Adjunct)  Date:  05/18/2017  Time:  11:35 PM  Type of Therapy:  Group Therapy  Participation Level:  Active  Participation Quality:  Attentive  Affect:  Appropriate  Cognitive:  Appropriate  Insight:  Appropriate  Engagement in Group:  Engaged  Modes of Intervention:  Discussion  Summary of Progress/Problems:  David Reeves 05/18/2017, 11:35 PM

## 2017-05-18 NOTE — Plan of Care (Signed)
Patient states that he slept "pretty good" last night. Patient verbalizes understanding of the information that has been provided to him and any questions that patient had today have been addressed and answered by this Clinical research associatewriter. Patient wanted a printout of his medications and this Clinical research associatewriter provided a list to him. Patient denies depression as well as SI/HI/AVH at this time. Patient has demonstrated self-control on the unit today and has been observed in his room reading books and doing cross word puzzles. Patient has been compliant with his treatment/medication regimen thus far. Patient endorses an anxiety level of "4.5/10" stating that "certain people and crowds" make him anxious. Patient has the ability to maintain adequate nutritional intake and participate in self-care. Patient has taken the adequate steps to control his pain level and anxiety in order for him to participate in unit groups today. Patient remains free from injury and is safe on the unit at this time.

## 2017-05-19 NOTE — [facility] Group Notes (Signed)
05/19/2017  Time: 0930   Type of Therapy and Topic:  Group Therapy:  Overcoming Obstacles   Participation Level:  Active   Description of Group:   In this group patients will be encouraged to explore what they see as obstacles to their own wellness and recovery. They will be guided to discuss their thoughts, feelings, and behaviors related to these obstacles. The group will process together ways to cope with barriers, with attention given to specific choices patients can make. Each patient will be challenged to identify changes they are motivated to make in order to overcome their obstacles. This group will be process-oriented, with patients participating in exploration of their own experiences, giving and receiving support, and processing challenge from other group members.   Therapeutic Goals: 1. Patient will identify personal and current obstacles as they relate to admission. 2. Patient will identify barriers that currently interfere with their wellness or overcoming obstacles.  3. Patient will identify feelings, thought process and behaviors related to these barriers. 4. Patient will identify two changes they are willing to make to overcome these obstacles:      Summary of Patient Progress Pt continues to work towards their tx goals but has not yet reached them. Pt was able to appropriately participate in group discussion, and was able to offer support/validation to other group members. Pt reported he is, "feeling better because I've had some of my medications. I'm having less anxiety." Pt reported he was unsure of the obstacle that brought him into the hospital. Pt reported his long term goal is to, "go home and be mentally stable." Pt reported two steps he can take to reach this goal, and overcome his obstacle, are "taking my medications right and avoiding street drugs."    Therapeutic Modalities:   Cognitive Behavioral Therapy Solution Focused Therapy Motivational Interviewing Relapse  Prevention Therapy  Heidi DachKelsey Ashleen Demma, MSW, LCSW 05/19/2017 10:26 AM

## 2017-05-19 NOTE — Progress Notes (Signed)
Recreation Therapy Notes   Date: 01.28.2019  Time: 1:00 PM  Location: Craft Room  Behavioral response: N/A  Intervention Topic: Coping Skills  Discussion/Intervention: Patient did not attend group.   Clinical Observations/Feedback:  Patient did not attend group.  Teana Lindahl LRT/CTRS           Timi Reeser 05/19/2017 3:05 PM

## 2017-05-19 NOTE — Plan of Care (Signed)
  Progressing Education: Knowledge of  General Education information/materials will improve 05/19/2017 0829 - Progressing by Crist InfanteFarrish, Alton Bouknight A, RN Emotional status will improve 05/19/2017 0829 - Progressing by Crist InfanteFarrish, Carliss Porcaro A, RN Mental status will improve 05/19/2017 0829 - Progressing by Crist InfanteFarrish, Ethanael Veith A, RN Verbalization of understanding the information provided will improve 05/19/2017 0829 - Progressing by Crist InfanteFarrish, Ellisha Bankson A, RN Coping: Ability to demonstrate self-control will improve 05/19/2017 0829 - Progressing by Crist InfanteFarrish, Governor Matos A, RN Health Behavior/Discharge Planning: Compliance with treatment plan for underlying cause of condition will improve 05/19/2017 0829 - Progressing by Crist InfanteFarrish, Eladio Dentremont A, RN Education: Will be free of psychotic symptoms 05/19/2017 0829 - Progressing by Crist InfanteFarrish, Breeonna Mone A, RN Nutritional: Ability to achieve adequate nutritional intake will improve 05/19/2017 0829 - Progressing by Crist InfanteFarrish, Reese Senk A, RN Safety: Ability to redirect hostility and anger into socially appropriate behaviors will improve 05/19/2017 0829 - Progressing by Crist InfanteFarrish, Posie Lillibridge A, RN Ability to remain free from injury will improve 05/19/2017 0829 - Progressing by Crist InfanteFarrish, Austine Wiedeman A, RN Self-Care: Ability to participate in self-care as condition permits will improve 05/19/2017 0829 - Progressing by Crist InfanteFarrish, Juanito Gonyer A, RN [facility] Participation in Recreation Therapeutic Interventions STG-Other Recreation Therapy Goal (Specify) Description Patient will attend group in a calm and appropriate manner with no impulsive behaviors x5 days.  05/19/2017 0829 - Progressing by Crist InfanteFarrish, Zaylyn Bergdoll A, RN Activity: Sleeping patterns will improve 05/19/2017 0829 - Progressing by Crist InfanteFarrish, Jazzmin Newbold A, RN Safety: Periods of time without injury will increase 05/19/2017 0829 - Progressing by Crist InfanteFarrish, Dillion Stowers A, RN

## 2017-05-19 NOTE — Progress Notes (Addendum)
[facility] MD Progress Note  05/19/2017 11:30 AM David Reeves  MRN:  [id number] Subjective:  Per RN staff, he has been very medication seeking, especially for controlled medications. He requests anxiety medications frequently despite not showing any symptoms of anxiety whatsoever. He has not had any overt delusional or psychotic behaviors on the unit. ON assessment today, he is organized and goal directed. He is very medication seeking again. He stats, "I want Ativan 2 mg or Halcion. What about Halcion?" Discussed that he is already on Klonopin and will not be giving additional benzos. He is also very focused on wanting to get a pain pump. He was encouraged to follow up with pain clinic when discharged. He agrees to do so. He does not have any overt delusional content and no longer mentions that something was inserted in his rectum. HE still has some paranoia and feeling "people in here are giving off negative vibes, Oxidative stress." He did go to groups this morning. He has been caring for his ADLs. He is concerned about his medication list and we went through them together. He wants to keep the Haldol as needed because it helps calm him down when he is agitated.   Principal Problem: Schizophrenia (HCC) Diagnosis:   Patient Active Problem List   Diagnosis Date Noted  . Schizophrenia (HCC) [F20.9] 05/14/2017    Priority: High  . Laceration of right hand without foreign body [S61.411A]   . Cannabis use disorder, moderate, dependence (HCC) [F12.20] 05/09/2017  . Closed compression fracture of L1 lumbar vertebra (HCC) [S32.010A] 05/24/2016  . Lumbar stenosis with neurogenic claudication [M48.062] 11/01/2015  . Chronic 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 [M54.41, G89.29] 11/08/2014  . Hx of hemorrhoids [Z87.19] 11/08/2014  . AAA (abdominal aortic aneurysm) (HCC) [I71.4] 09/22/2014  . Chronic pain associated with significant psychosocial dysfunction [G89.4] 09/22/2014  . Panic attack  [F41.0] 09/22/2014  . AB (asthmatic bronchitis) [J45.909] 08/17/2014  . 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 Backache [M54.9] 08/17/2014  . Lumbosacral spondylosis without myelopathy [M47.817] 08/17/2014  . Disorder of male genital organs [N50.9] 08/17/2014  . Brash [R12] 08/17/2014  . Low back pain [M54.5] 08/17/2014  . Tendon nodule [M67.90] 08/17/2014  . 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 Hernia, inguinal, right [K40.90] 08/17/2014  . Fast heart beat [R00.0] 08/17/2014  . Illness [R69] 08/17/2014  . Inguinal hernia [K40.90] 10/14/2012   Total Time spent with patient: 20 minutes  Past Psychiatric History: See H&P  Past Medical History:  Past Medical History:  Diagnosis Date  . Back pain 2014  . Bone spur   . Bulging disc 2014  . Degenerative disc disease   . Osteoarthritis   . Panic anxiety syndrome   . Taking multiple medications for chronic disease     Past Surgical History:  Procedure Laterality Date  . HERNIA REPAIR Left 2006,2014   [facility]  . TOE SURGERY Right 2007   Family History:  Family History  Problem Relation Age of Onset  . Diabetes Mother   . Diabetes Maternal Uncle   . Diabetes Maternal Grandmother   . Heart disease Maternal Grandmother    Family Psychiatric  History: See H&P Social History:  Social History   Substance and Sexual Activity  Alcohol Use No     Social History   Substance and Sexual Activity  Drug Use No    Social History   Socioeconomic History  . Marital status: Married    Spouse  name: None  . Number of children: None  . Years of education: None  . Highest education level: None  Social Needs  . Financial resource strain: None  . Food insecurity - worry: None  . Food insecurity - inability: None  . Transportation needs - medical: None  . Transportation needs - non-medical: None  Occupational History  . None  Tobacco Use  . Smoking status: Never Smoker  .  Smokeless tobacco: Never Used  Substance and Sexual Activity  . Alcohol use: No  . Drug use: No  . Sexual activity: None  Other Topics Concern  . None  Social History Narrative  . None   Additional Social History:    Pain Medications: Vicodin Prescriptions: Vicodin, Klonopin History of alcohol / drug use?: Yes Longest period of sobriety (when/how long): Unknown Negative Consequences of Use: Personal relationships, Financial Withdrawal Symptoms: (none) Name of Substance 1: marijuana 1 - Age of First Use: unknown 1 - Amount (size/oz): bowl 1 - Frequency: multiple times daily 1 - Duration: unknown 1 - Last Use / Amount: "a few days ago"                  Sleep: Good  Appetite:  Good  Current Medications: Current Facility-Administered Medications  Medication Dose Route Frequency Provider Last Rate Last Dose  . acetaminophen (TYLENOL) tablet 650 mg  650 mg Oral Q6H PRN Clapacs, [name], MD   650 mg at 05/18/17 0801  . alum & mag hydroxide-simeth (MAALOX/MYLANTA) 200-200-20 MG/5ML suspension 30 mL  30 mL Oral Q4H PRN Clapacs, [name], MD   30 mL at 05/16/17 1547  . cholecalciferol (VITAMIN D) tablet 2,000 Units  2,000 Units Oral Daily Haskell Riling, MD   2,000 Units at 05/19/17 0802  . clonazePAM (KLONOPIN) tablet 1 mg  1 mg Oral TID Haskell Riling, MD   1 mg at 05/19/17 0802  . cloNIDine (CATAPRES) tablet 0.1 mg  0.1 mg Oral BID PRN Sawsan Riggio, [name], MD      . haloperidol (HALDOL) tablet 5 mg  5 mg Oral Q6H PRN Aundria Rud, MD   5 mg at 05/19/17 8295   Or  . haloperidol lactate (HALDOL) injection 5 mg  5 mg Intramuscular Q6H PRN Aundria Rud, MD      . HYDROcodone-acetaminophen (NORCO) 7.5-325 MG per tablet 1 tablet  1 tablet Oral Q6H PRN Clapacs, [name], MD   1 tablet at 05/19/17 0916  . hydrOXYzine (ATARAX/VISTARIL) tablet 50 mg  50 mg Oral TID PRN Clapacs, [name], MD   50 mg at 05/19/17 6213  . magnesium hydroxide (MILK OF MAGNESIA) suspension 30 mL  30 mL  Oral Daily PRN Clapacs, [name], MD   30 mL at 05/16/17 1252  . neomycin-bacitracin-polymyxin (NEOSPORIN) ointment   Topical PRN [name], MD   1 application at 05/13/17 2227  . neomycin-bacitracin-polymyxin (NEOSPORIN) ointment   Topical Daily [name], MD   1 application at 05/17/17 [phone]  . OLANZapine (ZYPREXA) tablet 15 mg  15 mg Oral QHS [place] Duquette, [name], MD   15 mg at 05/18/17 2105   And  . OLANZapine (ZYPREXA) tablet 5 mg  5 mg Oral BH-q7a Fleetwood Pierron, [name], MD   5 mg at 05/19/17 0600  . polyethylene glycol (MIRALAX / GLYCOLAX) packet 17 g  17 g Oral Daily [name], MD   17 g at 05/19/17 0802    Lab Results: No results found for this or any previous visit (  from the past 48 hour(s)).  Blood Alcohol level:  Lab Results  Component Value Date   ETH <10 05/08/2017    Metabolic Disorder Labs: Lab Results  Component Value Date   HGBA1C 4.8 05/10/2017   MPG 91.06 05/10/2017   No results found for: PROLACTIN Lab Results  Component Value Date   CHOL 162 05/10/2017   TRIG 70 05/10/2017   HDL 38 (L) 05/10/2017   CHOLHDL 4.3 05/10/2017   VLDL 14 05/10/2017   LDLCALC 110 (H) 05/10/2017   LDLCALC 112 (H) 11/13/2015    Physical Findings: AIMS: Facial and Oral Movements Muscles of Facial Expression: None, normal Lips and Perioral Area: None, normal Jaw: None, normal Tongue: None, normal,Extremity Movements Upper (arms, wrists, hands, fingers): None, normal Lower (legs, knees, ankles, toes): None, normal, Trunk Movements Neck, shoulders, hips: None, normal, Overall Severity Severity of abnormal movements (highest score from questions above): None, normal Incapacitation due to abnormal movements: None, normal Patient's awareness of abnormal movements (rate only patient's report): No Awareness, Dental Status Current problems with teeth and/or dentures?: No Does patient usually wear dentures?: No  CIWA:  CIWA-Ar Total: 0 COWS:  COWS Total Score:  0  Musculoskeletal: Strength & Muscle Tone: within normal limits Gait & Station: normal Patient leans: N/A  Psychiatric Specialty Exam: Physical Exam  Nursing note and vitals reviewed.   Review of Systems  All other systems reviewed and are negative.   Blood pressure 128/89, pulse 97, temperature 98.1 F (36.7 C), temperature source Oral, resp. rate 18, height 5\' 9"  (1.753 m), weight 83 kg (183 lb), SpO2 100 %.Body mass index is 27.02 kg/m.  General Appearance: Casual  Eye Contact:  Good  Speech:  Clear and Coherent  Volume:  Normal  Mood:  Euthymic  Affect:  Constricted  Thought Process:  Coherent and Goal Directed  Orientation:  Full (Time, Place, and Person)  Thought Content:  Logical, some paranoia  Suicidal Thoughts:  No  Homicidal Thoughts:  No  Memory:  Immediate;   Fair  Judgement:  Fair  Insight:  Lacking  Psychomotor Activity:  Normal  Concentration:  Concentration: Fair  Recall:  [employer]Fair  Fund of Knowledge:  Fair  Language:  Fair  Akathisia:  No      Assets:  Communication Skills Resilience  ADL's:  Intact  Cognition:  WNL  Sleep:  Number of Hours: 6     Treatment Plan Summary: 47 yo male admitted due to delusions and paranoia. Delusions are much less prominent. He is still slightly paranoid but improving. He is extremely medication seeking, especially for controlled substances. He has been calm with no anger outbursts.  Plan:  -Continue Zyprexa 5 mg qam and 15 mg qhs -Continue prn Haldol as he feels this is helpful for anxiety and "to calm me down."  Vitamin D def -Replace  Anxiety -On Klonopin 1 mg TID which he has been on for 4 years. We did discuss that risks of benzo's and opiates including death but he would like to continue Klonopin. He should taper down on this in the future.  Tachycardia -Resolved -Pt would like to d/c propranolol  Dispo -he will return home on discharge   Haskell RilingHolly R Khaled Herda, MD 05/19/2017, 11:30 AM

## 2017-05-19 NOTE — Progress Notes (Signed)
Patient ID: David Reeves, male   DOB: March 14, 1971, 47 y.o.   MRN: [id number]  CSW received a call from pt's wife, David Reeves at [phone].  Pt's wife stated, "I go to see him every day. He still seems a little off, but he's doing better. I talked to him last night. Saturday night he was really good and I didn't hear anything out of the way. Last night he said a few things here and there. He'd say a few things about my daughter's friend, David Reeves. He said something about people in the attic. I told him the sheriff went up there and looked, but he said he just went up there and shined his flashlight around. There's a girl we know in there with him, and we've known her for a long time. He thinks other people are conspiring against them. He's still a little delusional. I don't know if it was because of her being in there, but it was the first time he'd said something about someone conspiring against him in a few days. He keeps asking me if I deleted stuff off our daughter's phone because there's evidence on it."   CSW will pass this information along to tx team, and will continue to coordinate with pt's wife as needed for updates and discharge planning.   Heidi DachKelsey Chonda Baney, MSW, LCSW 05/19/2017 8:56 AM

## 2017-05-19 NOTE — Progress Notes (Signed)
D: Patient reports being  Very anxious this am . Questioned patient about the medication he had received previously. Stated he was still anxious . Patient received his standing medication  Which at this time  Was clonopin.  Patient wearing  Brace  Voice also of seats being  Hard to sit in .   A: Patient noted  Calm on approach, maintained  Eye contact , gait normal  Noted to brush teeth while talking  To Clinical research associatewriter.  Instructed on doing the inventory sheet  Noted compliant  With this  R: Staff will continue to monitor behavior .

## 2017-05-19 NOTE — Progress Notes (Signed)
Pt. Reports increased agitations and anxieties from other patients in the milieu this morning. Pt. Requested oral PRN medications to comfort his agitations and anxieties. This Clinical research associatewriter gave oral PRN medications for pt. Safety and comfort. Will reassess effectiveness. Will continue to monitor.

## 2017-05-20 MED ORDER — OLANZAPINE 10 MG PO TABS
20.0000 mg | ORAL_TABLET | Freq: Every day | ORAL | Status: DC
  Administered 2017-05-20 – 2017-05-22 (×3): 20 mg via ORAL
  Filled 2017-05-20 (×3): qty 2

## 2017-05-20 MED ORDER — OLANZAPINE 5 MG PO TABS
5.0000 mg | ORAL_TABLET | ORAL | Status: DC
  Administered 2017-05-21 – 2017-05-23 (×3): 5 mg via ORAL
  Filled 2017-05-20 (×3): qty 1

## 2017-05-20 NOTE — Progress Notes (Signed)
Recreation Therapy Notes   Date: 01.29.2019  Time: 1:00 PM  Location: Craft Room  Behavioral response: Appropriate  Intervention Topic: Values  Discussion/Intervention: Group content today was focused on values. The group identified what values are and where they come from. Individuals expressed some values and how many they have. Patients described how they go about add or removing values. The group described the importance of having values and how they go about using them in daily life. Patient participated in the intervention "My Values" where they were able to pick out values that were important to them and make a visual aide.  Clinical Observations/Feedback:  Patient came to group and participated in the intervention. He stated he was uncomfortable and needed to leave group. Patient left group and never returned.  Kashara Blocher LRT/CTRS         Emer Onnen 05/20/2017 2:07 PM

## 2017-05-20 NOTE — Plan of Care (Signed)
  Progressing Education: Emotional status will improve 05/20/2017 2131 - Progressing by Galen ManilaVigil, Keeon Zurn E, RN Mental status will improve 05/20/2017 2131 - Progressing by Galen ManilaVigil, Lincon Sahlin E, RN Coping: Ability to demonstrate self-control will improve 05/20/2017 2131 - Progressing by Galen ManilaVigil, Emran Molzahn E, RN

## 2017-05-20 NOTE — Plan of Care (Signed)
Patient slept for Estimated Hours of 6.45; Precautionary checks every 15 minutes for safety maintained, room free of safety hazards, patient sustains no injury or falls during this shift.  

## 2017-05-20 NOTE — [facility] Group Notes (Signed)
05/20/2017 9:30AM  Type of Therapy/Topic:  Group Therapy:  Feelings about Diagnosis  Participation Level:  Did Not Attend   Description of Group:   This group will allow patients to explore their thoughts and feelings about diagnoses they have received. Patients will be guided to explore their level of understanding and acceptance of these diagnoses. Facilitator will encourage patients to process their thoughts and feelings about the reactions of others to their diagnosis and will guide patients in identifying ways to discuss their diagnosis with significant others in their lives. This group will be process-oriented, with patients participating in exploration of their own experiences, giving and receiving support, and processing challenge from other group members.   Therapeutic Goals: 1. Patient will demonstrate understanding of diagnosis as evidenced by identifying two or more symptoms of the disorder 2. Patient will be able to express two feelings regarding the diagnosis 3. Patient will demonstrate their ability to communicate their needs through discussion and/or role play  Summary of Patient Progress: Patient was encouraged and invited to attend group. Patient did not attend group. Social worker will continue to encourage group participation in the future.       Therapeutic Modalities:   Cognitive Behavioral Therapy Brief Therapy Feelings Identification    Johny ShearsCassandra  Domonic Hiscox, LCSW 05/20/2017 10:14 AM

## 2017-05-20 NOTE — Progress Notes (Signed)
[facility]  05/20/2017 9:59 AM David Reeves  MRN:  [id number] Subjective:  Pt has been calm on the unit. He is still somewhat paranoid and delusional about "having something metal in me." He asks why the metal detector caused his leg hairs to raise when he was wanded on admission. Delusions are much less prominent and is able to accept that these things may not be real. He has been extremely medication seeking on the unit, specifically for pain medications and benzos. He states that he has been trying to call the numbers to complain about his treatment here. Discussed that he is able to talk to this provider about his concerns too. HE states, "Everytime I go to the med room. They don't give me medications I need. Everyone is just causing oxidative stress." Per nursing staff, he is very medication seeking and asking for benzos and other medications that are not ordered. Pt is sleeping well and eating well. He is trying to attend groups but states he does not like to be around a lot of people and causes him anxiety. He states that he wants to discharge soon because he needs his heating pad and other things that he cant have on the unit. He states, "It is causing more stress being here because it's not comfortable. I have pain."  Principal Problem: Schizophrenia (HCC) Diagnosis:   Patient Active Problem List   Diagnosis Date Noted  . Schizophrenia (HCC) [F20.9] 05/14/2017    Priority: High  . Laceration of right hand without foreign body [S61.411A]   . Cannabis use disorder, moderate, dependence (HCC) [F12.20] 05/09/2017  . Closed compression fracture of L1 lumbar vertebra (HCC) [S32.010A] 05/24/2016  . Lumbar stenosis with neurogenic claudication [M48.062] 11/01/2015  . Chronic 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 [M54.41, G89.29] 11/08/2014  . Hx of hemorrhoids [Z87.19] 11/08/2014  . AAA (abdominal aortic aneurysm) (HCC) [I71.4] 09/22/2014  . Chronic pain associated  with significant psychosocial dysfunction [G89.4] 09/22/2014  . Panic attack [F41.0] 09/22/2014  . AB (asthmatic bronchitis) [J45.909] 08/17/2014  . 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 Backache [M54.9] 08/17/2014  . Lumbosacral spondylosis without myelopathy [M47.817] 08/17/2014  . Disorder of male genital organs [N50.9] 08/17/2014  . Brash [R12] 08/17/2014  . Low back pain [M54.5] 08/17/2014  . Tendon nodule [M67.90] 08/17/2014  . 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 Hernia, inguinal, right [K40.90] 08/17/2014  . Fast heart beat [R00.0] 08/17/2014  . Illness [R69] 08/17/2014  . Inguinal hernia [K40.90] 10/14/2012   Total Time spent with patient: 20 minutes  Past Psychiatric History: See H&P  Past Medical History:  Past Medical History:  Diagnosis Date  . Back pain 2014  . Bone spur   . Bulging disc 2014  . Degenerative disc disease   . Osteoarthritis   . Panic anxiety syndrome   . Taking multiple medications for chronic disease     Past Surgical History:  Procedure Laterality Date  . HERNIA REPAIR Left 2006,2014   [facility]  . TOE SURGERY Right 2007   Family History:  Family History  Problem Relation Age of Onset  . Diabetes Mother   . Diabetes Maternal Uncle   . Diabetes Maternal Grandmother   . Heart disease Maternal Grandmother    Family Psychiatric  History: See H7P Social History:  Social History   Substance and Sexual Activity  Alcohol Use No     Social History   Substance and Sexual Activity  Drug Use No    Social History   Socioeconomic History  . Marital status: Married    Spouse name: None  . Number of children: None  . Years of education: None  . Highest education level: None  Social Needs  . Financial resource strain: None  . Food insecurity - worry: None  . Food insecurity - inability: None  . Transportation needs - medical: None  . Transportation needs - non-medical: None   Occupational History  . None  Tobacco Use  . Smoking status: Never Smoker  . Smokeless tobacco: Never Used  Substance and Sexual Activity  . Alcohol use: No  . Drug use: No  . Sexual activity: None  Other Topics Concern  . None  Social History Narrative  . None   Additional Social History:    Pain Medications: Vicodin Prescriptions: Vicodin, Klonopin History of alcohol / drug use?: Yes Longest period of sobriety (when/how long): Unknown Negative Consequences of Use: Personal relationships, Financial Withdrawal Symptoms: (none) Name of Substance 1: marijuana 1 - Age of First Use: unknown 1 - Amount (size/oz): bowl 1 - Frequency: multiple times daily 1 - Duration: unknown 1 - Last Use / Amount: "a few days ago"                  Sleep: Good  Appetite:  NA  Current Medications: Current Facility-Administered Medications  Medication Dose Route Frequency Provider Last Rate Last Dose  . acetaminophen (TYLENOL) tablet 650 mg  650 mg Oral Q6H PRN Clapacs, [name], MD   650 mg at 05/18/17 0801  . alum & mag hydroxide-simeth (MAALOX/MYLANTA) 200-200-20 MG/5ML suspension 30 mL  30 mL Oral Q4H PRN Clapacs, [name], MD   30 mL at 05/16/17 1547  . cholecalciferol (VITAMIN D) tablet 2,000 Units  2,000 Units Oral Daily Haskell Riling, MD   2,000 Units at 05/20/17 0801  . clonazePAM (KLONOPIN) tablet 1 mg  1 mg Oral TID Haskell Riling, MD   1 mg at 05/20/17 0801  . cloNIDine (CATAPRES) tablet 0.1 mg  0.1 mg Oral BID PRN Deserie Dirks, [name], MD      . haloperidol (HALDOL) tablet 5 mg  5 mg Oral Q6H PRN Aundria Rud, MD   5 mg at 05/19/17 1610   Or  . haloperidol lactate (HALDOL) injection 5 mg  5 mg Intramuscular Q6H PRN Aundria Rud, MD      . HYDROcodone-acetaminophen (NORCO) 7.5-325 MG per tablet 1 tablet  1 tablet Oral Q6H PRN Clapacs, [name], MD   1 tablet at 05/20/17 0801  . hydrOXYzine (ATARAX/VISTARIL) tablet 50 mg  50 mg Oral TID PRN Clapacs, [name], MD   50 mg at  05/19/17 9604  . magnesium hydroxide (MILK OF MAGNESIA) suspension 30 mL  30 mL Oral Daily PRN Clapacs, [name], MD   30 mL at 05/16/17 1252  . neomycin-bacitracin-polymyxin (NEOSPORIN) ointment   Topical PRN [name], MD   1 application at 05/13/17 2227  . neomycin-bacitracin-polymyxin (NEOSPORIN) ointment   Topical Daily [name], MD   1 application at 05/17/17 [phone]  . OLANZapine (ZYPREXA) tablet 20 mg  20 mg Oral QHS Wade Asebedo, [name], MD       And  . [START ON 05/21/2017] OLANZapine (ZYPREXA) tablet 5 mg  5 mg Oral BH-q7a Tatsuya Okray R, MD      . polyethylene glycol (MIRALAX / GLYCOLAX) packet 17 g  17 g Oral Daily [name], MD   [phone]  g at 05/20/17 1610    Lab Results: No results found for this or any previous visit (from the past 48 hour(s)).  Blood Alcohol level:  Lab Results  Component Value Date   ETH <10 05/08/2017    Metabolic Disorder Labs: Lab Results  Component Value Date   HGBA1C 4.8 05/10/2017   MPG 91.06 05/10/2017   No results found for: PROLACTIN Lab Results  Component Value Date   CHOL 162 05/10/2017   TRIG 70 05/10/2017   HDL 38 (L) 05/10/2017   CHOLHDL 4.3 05/10/2017   VLDL 14 05/10/2017   LDLCALC 110 (H) 05/10/2017   LDLCALC 112 (H) 11/13/2015    Physical Findings: AIMS: Facial and Oral Movements Muscles of Facial Expression: None, normal Lips and Perioral Area: None, normal Jaw: None, normal Tongue: None, normal,Extremity Movements Upper (arms, wrists, hands, fingers): None, normal Lower (legs, knees, ankles, toes): None, normal, Trunk Movements Neck, shoulders, hips: None, normal, Overall Severity Severity of abnormal movements (highest score from questions above): None, normal Incapacitation due to abnormal movements: None, normal Patient's awareness of abnormal movements (rate only patient's report): No Awareness, Dental Status Current problems with teeth and/or dentures?: No Does patient usually wear dentures?: No  CIWA:   CIWA-Ar Total: 0 COWS:  COWS Total Score: 0  Musculoskeletal: Strength & Muscle Tone: within normal limits Gait & Station: normal Patient leans: N/A  Psychiatric Specialty Exam: Physical Exam  Nursing note and vitals reviewed.   Review of Systems  All other systems reviewed and are negative.   Blood pressure 122/73, pulse 83, temperature 98.1 F (36.7 C), temperature source Oral, resp. rate 18, height 5\' 9"  (1.753 m), weight 83 kg (183 lb), SpO2 100 %.Body mass index is 27.02 kg/m.  General Appearance: Casual  Eye Contact:  Good  Speech:  Clear and Coherent  Volume:  Normal  Mood:  Anxious and Irritable  Affect:  Appropriate  Thought Process:  Coherent  Orientation:  Full (Time, Place, and Person)  Thought Content:  Delusions  Suicidal Thoughts:  No  Homicidal Thoughts:  No  Memory:  Immediate;   Fair  Judgement:  Fair  Insight:  Lacking  Psychomotor Activity:  Normal  Concentration:  Concentration: Fair  Recall:  [employer] of Knowledge:  Fair  Language:  Fair  Akathisia:  No      Assets:  Resilience  ADL's:  Intact  Cognition:  WNL  Sleep:  Number of Hours: 6.45     Treatment Plan Summary: 47 yo male admitted due to delusions and paranoia. Psychotic symptoms have improved significant but still with some delusional content but much less prominent. Now that psychosis is clearing he is now very medication seeking specifically narcotics and benzos and is very upset that he is not getting Ativan anymore (he is also on Klonopin). He states that he is very anxious however affect does not match this at all. He is very calm. He has not had any episodes of agitation or aggression on the unit.   Plan:  -Increase Zyprexa 5 mg qam and 20 mg qhs. This is slightly higher than FDA max dose for maintained. However, benefits outweigh risks. He has been improving on Zyprexa but still has some residual delusions and paranoia and could benefit from higher dose. EKG and QTc is  okay.  Anxiety -Continue Klonopin 1 mg TID  Dispo -He will return home on discharge   Haskell Riling, MD 05/20/2017, 9:59 AM

## 2017-05-20 NOTE — Progress Notes (Signed)
   05/20/17 1310  Clinical Encounter Type  Visited With Patient  Visit Type Other (Comment) (group)  Spiritual Encounters  Spiritual Needs Emotional   Patient participated in beginning of chaplain life review group, but left before group ended.

## 2017-05-20 NOTE — [facility] Group Notes (Signed)
LCSW Group Therapy Note 05/20/2017 9:00am  Type of Therapy and Topic:  Group Therapy:  Setting Goals  Participation Level:  Did Not Attend  Description of Group: In this process group, patients discussed using strengths to work toward goals and address challenges.  Patients identified two positive things about themselves and one goal they were working on.  Patients were given the opportunity to share openly and support each other's plan for self-empowerment.  The group discussed the value of gratitude and were encouraged to have a daily reflection of positive characteristics or circumstances.  Patients were encouraged to identify a plan to utilize their strengths to work on current challenges and goals.  Therapeutic Goals 1. Patient will verbalize personal strengths/positive qualities and relate how these can assist with achieving desired personal goals 2. Patients will verbalize affirmation of peers plans for personal change and goal setting 3. Patients will explore the value of gratitude and positive focus as related to successful achievement of goals 4. Patients will verbalize a plan for regular reinforcement of personal positive qualities and circumstances.  Summary of Patient Progress:       Therapeutic Modalities Cognitive Behavioral Therapy Motivational Interviewing    David Reeves David Hena Ewalt, LCSW 05/20/2017 1:08 PM

## 2017-05-20 NOTE — Progress Notes (Addendum)
Patient found in room awake upon my arrival. Patient is visible but not social this evening. Patient continues to believe he has a fentanyl patch implanted in his rectum. Denies SI, HI, AVH. Complains of pain 5/10 in his back, given Norco. Reports only small BM today. Compliant with HS medications and staff direction.   Given Haldol and Vistaril for complaints of agitation, approximately one hour later patient reports, "I feel exactly perfect right now. No anxiety, nothing. I feel really good. We are headed in the right direction." Q 15 minute checks maintained. Will continue to monitor throughout the shift.  0600- Patient slept 6.25 hours. No distress noted. Awake at 0600, complains of back pain, given Norco. @0610 , patient complains, "My anxiety is through the roof. I need my Klonopin." Educated patient regarding appropriate times for administering Klonopin. Patient given PRN Vistaril and Haldol. Will monitor for efficacy.

## 2017-05-20 NOTE — [facility] Group Notes (Signed)
[facility] Group Notes:  (Nursing/MHT/Case Management/Adjunct)  Date:  05/20/2017  Time:  2:40 AM  Type of Therapy:  Psychoeducational Skills  Participation Level:  Active  Participation Quality:  Appropriate and Attentive  Affect:  Appropriate  Cognitive:  Appropriate  Insight:  Appropriate and Good  Engagement in Group:  Engaged  Modes of Intervention:  Discussion, Socialization and Support  Summary of Progress/Problems:  David MilroyLaquanda Y Susann Reeves 05/20/2017, 2:40 AM

## 2017-05-20 NOTE — Progress Notes (Signed)
Patient ID: David Reeves, male   DOB: [birth year], 47 y.o.   MRN: [id number] Visible, polite, pleasant, interacting with peers, attended the wrap up group; no request for CT Scan of abdomen or head, no temper tantrums or outbursts of anger, rated lower back pain as 7/10, fixated on Clonazepam and Norco; received 1 tablet of Norco.

## 2017-05-20 NOTE — [facility] Group Notes (Signed)
[facility] Group Notes:  (Nursing/MHT/Case Management/Adjunct)  Date:  05/20/2017  Time:  9:27 PM  Type of Therapy:  Group Therapy  Participation Level:  Active  Participation Quality:  Sharing  Affect:  Pt. left early out of grp.  Cognitive:  Appropriate  Insight:  Good  Engagement in Group:  Supportive  Modes of Intervention:  Support  Summary of Progress/Problems:  David Reeves 05/20/2017, 9:27 PM

## 2017-05-20 NOTE — Plan of Care (Signed)
Patient is alert and oriented X 4. Patient denies SI, HI, and AVH. This morning patient complains of  chronic back pain which is managed with NORCO. Patient is also wanting something for anxiety but does not show signs of anxiety. Patient attends groups and is cooperative today with medications. Patient has some paranoia with medications. Patient can be intrusive at times. Patient is able to verbalize uses of medications. Nurse will continue to monitor patient. Safety checks will continue Q 15 minutes.

## 2017-05-21 NOTE — [facility] Group Notes (Signed)
05/21/2017 9:30AM  Type of Therapy/Topic:  Group Therapy:  Emotion Regulation  Participation Level:  Active   Description of Group:   The purpose of this group is to assist patients in learning to regulate negative emotions and experience positive emotions. Patients will be guided to discuss ways in which they have been vulnerable to their negative emotions. These vulnerabilities will be juxtaposed with experiences of positive emotions or situations, and patients will be challenged to use positive emotions to combat negative ones. Special emphasis will be placed on coping with negative emotions in conflict situations, and patients will process healthy conflict resolution skills.  Therapeutic Goals: 1. Patient will identify two positive emotions or experiences to reflect on in order to balance out negative emotions 2. Patient will label two or more emotions that they find the most difficult to experience 3. Patient will demonstrate positive conflict resolution skills through discussion and/or role plays  Summary of Patient Progress: Actively and appropriately engaged in the group. Patient was able to provide support and validation to other group members.Patient practiced active listening when interacting with the facilitator and other group members Patient in still in the process of obtaining treatment goals. Marcial Pacasimothy mentions how his daughter is someone that he can reflect on to balance out negative emotions. He says that the grief of his mother is something that is hard for him to experience. "I always reflect back on her memories through pictures. I know that if I need someone to talk to that I can always phone a friend."      Therapeutic Modalities:   Cognitive Behavioral Therapy Feelings Identification Dialectical Behavioral Therapy   Johny ShearsCassandra  Ande Therrell, LCSW 05/21/2017 10:14 AM

## 2017-05-21 NOTE — [facility] Group Notes (Signed)
[facility] Group Notes:  (Nursing/MHT/Case Management/Adjunct)  Date:  05/21/2017  Time:  2:37 PM  Type of Therapy:  Psychoeducational Skills  Participation Level:  Active  Participation Quality:  Appropriate  Affect:  Appropriate  Cognitive:  Alert and Appropriate  Insight:  Appropriate  Engagement in Group:  Engaged  Modes of Intervention:  Activity and Education  Summary of Progress/Problems:  David Reeves  David Reeves 05/21/2017, 2:37 PM

## 2017-05-21 NOTE — Progress Notes (Signed)
[facility] MD [facility] Note  05/21/2017 11:47 AM David Reeves  MRN:  [id number] Subjective:  Pt states that he is feeling good today and anxiety and pain are at a low level. He states that last night was the best he felt. He did receive Haldol and Vistaril and states, "That helped a lot to calm me down." He is still focused on medications, especially benzos. HE does not have any overt delusions today and we were able to have a very appropriate conversation. He described how he likes to canoe and is looking forward to getting warmer out so he can take it to a pond. He states that "this is very peaceful to me." HE also enjoys riding motorcycles and got a new one in November and "this is very therapeutic to me." He slept really well last night and eating well. He is appropriate on the unit. He denies SI, HI ,AH VH. He states that he was able to stay in group for the full time today without feeling anxious.   Principal Problem: Schizophrenia (HCC) Diagnosis:   Patient Active Problem List   Diagnosis Date Noted  . Schizophrenia (HCC) [F20.9] 05/14/2017    Priority: High  . Laceration of right hand without foreign body [S61.411A]   . Cannabis use disorder, moderate, dependence (HCC) [F12.20] 05/09/2017  . Closed compression fracture of L1 lumbar vertebra (HCC) [S32.010A] 05/24/2016  . Lumbar stenosis with neurogenic claudication [M48.062] 11/01/2015  . Chronic 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 [M54.41, G89.29] 11/08/2014  . Hx of hemorrhoids [Z87.19] 11/08/2014  . AAA (abdominal aortic aneurysm) (HCC) [I71.4] 09/22/2014  . Chronic pain associated with significant psychosocial dysfunction [G89.4] 09/22/2014  . Panic attack [F41.0] 09/22/2014  . AB (asthmatic bronchitis) [J45.909] 08/17/2014  . 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 Backache [M54.9] 08/17/2014  . Lumbosacral spondylosis without myelopathy [M47.817] 08/17/2014  . Disorder of male  genital organs [N50.9] 08/17/2014  . Brash [R12] 08/17/2014  . Low back pain [M54.5] 08/17/2014  . Tendon nodule [M67.90] 08/17/2014  . 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 Hernia, inguinal, right [K40.90] 08/17/2014  . Fast heart beat [R00.0] 08/17/2014  . Illness [R69] 08/17/2014  . Inguinal hernia [K40.90] 10/14/2012   Total Time spent with patient: 20 minutes  Past Psychiatric History: See H&P  Past Medical History:  Past Medical History:  Diagnosis Date  . Back pain 2014  . Bone spur   . Bulging disc 2014  . Degenerative disc disease   . Osteoarthritis   . Panic anxiety syndrome   . Taking multiple medications for chronic disease     Past Surgical History:  Procedure Laterality Date  . HERNIA REPAIR Left 2006,2014   [facility]  . TOE SURGERY Right 2007   Family History:  Family History  Problem Relation Age of Onset  . Diabetes Mother   . Diabetes Maternal Uncle   . Diabetes Maternal Grandmother   . Heart disease Maternal Grandmother    Family Psychiatric  History: See H&P Social History:  Social History   Substance and Sexual Activity  Alcohol Use No     Social History   Substance and Sexual Activity  Drug Use No    Social History   Socioeconomic History  . Marital status: Married    Spouse name: None  . Number of children: None  . Years of education: None  . Highest education level: None  Social Needs  . Financial resource strain: None  .  Food insecurity - worry: None  . Food insecurity - inability: None  . Transportation needs - medical: None  . Transportation needs - non-medical: None  Occupational History  . None  Tobacco Use  . Smoking status: Never Smoker  . Smokeless tobacco: Never Used  Substance and Sexual Activity  . Alcohol use: No  . Drug use: No  . Sexual activity: None  Other Topics Concern  . None  Social History Narrative  . None   Additional Social History:    Pain Medications:  Vicodin Prescriptions: Vicodin, Klonopin History of alcohol / drug use?: Yes Longest period of sobriety (when/how long): Unknown Negative Consequences of Use: Personal relationships, Financial Withdrawal Symptoms: (none) Name of Substance 1: marijuana 1 - Age of First Use: unknown 1 - Amount (size/oz): bowl 1 - Frequency: multiple times daily 1 - Duration: unknown 1 - Last Use / Amount: "a few days ago"                  Sleep: Good  Appetite:  Good  Current Medications: Current Facility-Administered Medications  Medication Dose Route Frequency Provider Last Rate Last Dose  . acetaminophen (TYLENOL) tablet 650 mg  650 mg Oral Q6H PRN Clapacs, [name], MD   650 mg at 05/18/17 0801  . alum & mag hydroxide-simeth (MAALOX/MYLANTA) 200-200-20 MG/5ML suspension 30 mL  30 mL Oral Q4H PRN Clapacs, [name], MD   30 mL at 05/16/17 1547  . cholecalciferol (VITAMIN D) tablet 2,000 Units  2,000 Units Oral Daily Haskell Riling, MD   2,000 Units at 05/21/17 0802  . clonazePAM (KLONOPIN) tablet 1 mg  1 mg Oral TID Haskell Riling, MD   1 mg at 05/21/17 0802  . cloNIDine (CATAPRES) tablet 0.1 mg  0.1 mg Oral BID PRN Avant Printy, [name], MD      . haloperidol (HALDOL) tablet 5 mg  5 mg Oral Q6H PRN Aundria Rud, MD   5 mg at 05/21/17 1610   Or  . haloperidol lactate (HALDOL) injection 5 mg  5 mg Intramuscular Q6H PRN Aundria Rud, MD      . HYDROcodone-acetaminophen (NORCO) 7.5-325 MG per tablet 1 tablet  1 tablet Oral Q6H PRN Clapacs, [name], MD   1 tablet at 05/21/17 0601  . hydrOXYzine (ATARAX/VISTARIL) tablet 50 mg  50 mg Oral TID PRN Clapacs, [name], MD   50 mg at 05/21/17 [phone]  . magnesium hydroxide (MILK OF MAGNESIA) suspension 30 mL  30 mL Oral Daily PRN Clapacs, [name], MD   30 mL at 05/16/17 1252  . neomycin-bacitracin-polymyxin (NEOSPORIN) ointment   Topical PRN [name], MD   1 application at 05/13/17 2227  . neomycin-bacitracin-polymyxin (NEOSPORIN) ointment   Topical  Daily [name], MD   1 application at 05/21/17 0802  . OLANZapine (ZYPREXA) tablet 20 mg  20 mg Oral QHS Juluis Fitzsimmons, [name], MD   20 mg at 05/20/17 2040   And  . OLANZapine (ZYPREXA) tablet 5 mg  5 mg Oral BH-q7a Jordon Kristiansen, [name], MD   5 mg at 05/21/17 0601  . polyethylene glycol (MIRALAX / GLYCOLAX) packet 17 g  17 g Oral Daily [name], MD   17 g at 05/21/17 0802    Lab Results: No results found for this or any previous visit (from the past 48 hour(s)).  Blood Alcohol level:  Lab Results  Component Value Date   ETH <10 05/08/2017    Metabolic Disorder Labs: Lab Results  Component  Value Date   HGBA1C 4.8 05/10/2017   MPG 91.06 05/10/2017   No results found for: PROLACTIN Lab Results  Component Value Date   CHOL 162 05/10/2017   TRIG 70 05/10/2017   HDL 38 (L) 05/10/2017   CHOLHDL 4.3 05/10/2017   VLDL 14 05/10/2017   LDLCALC 110 (H) 05/10/2017   LDLCALC 112 (H) 11/13/2015    Physical Findings: AIMS: Facial and Oral Movements Muscles of Facial Expression: None, normal Lips and Perioral Area: None, normal Jaw: None, normal Tongue: None, normal,Extremity Movements Upper (arms, wrists, hands, fingers): None, normal Lower (legs, knees, ankles, toes): None, normal, Trunk Movements Neck, shoulders, hips: None, normal, Overall Severity Severity of abnormal movements (highest score from questions above): None, normal Incapacitation due to abnormal movements: None, normal Patient's awareness of abnormal movements (rate only patient's report): No Awareness, Dental Status Current problems with teeth and/or dentures?: No Does patient usually wear dentures?: No  CIWA:  CIWA-Ar Total: 0 COWS:  COWS Total Score: 0  Musculoskeletal: Strength & Muscle Tone: within normal limits Gait & Station: normal Patient leans: N/A  Psychiatric Specialty Exam: Physical Exam  Nursing note and vitals reviewed.   Review of Systems  All other systems reviewed and are negative.    Blood pressure 133/84, pulse 92, temperature (!) 97.5 F (36.4 C), temperature source Oral, resp. rate 18, height 5\' 9"  (1.753 m), weight 83 kg (183 lb), SpO2 98 %.Body mass index is 27.02 kg/m.  General Appearance: Casual  Eye Contact:  Good  Speech:  Clear and Coherent  Volume:  Normal  Mood:  Euthymic  Affect:  Appropriate  Thought Process:  Coherent and Goal Directed  Orientation:  Full (Time, Place, and Person)  Thought Content:  Logical  Suicidal Thoughts:  No  Homicidal Thoughts:  No  Memory:  Immediate;   Fair  Judgement:  Fair  Insight:  Fair  Psychomotor Activity:  Normal  Concentration:  Concentration: Fair  Recall:  [employer] of Knowledge:  Fair  Language:  Fair  Akathisia:  No      Assets:  Communication Skills Desire for Improvement Housing Resilience Social Support  ADL's:  Intact  Cognition:  WNL  Sleep:  Number of Hours: 6.25     Treatment Plan Summary: 47 yo male admitted due to paranoia and delusions. Symptoms are improving and no delusional thought content present today. He did have some delusions yesterday but much less prominent. He has been calm on the unit with no anger outburst. He feels the Haldol is very helpful to calm him down when he takes it as needed. Will continue to have this available when he leaves the hospital so he can have it when needed. HE is still displaying some residual symptoms of psychosis at times so may need 2 antipsychotics since monotherapy on max dose of Zyprexa not fully effective.   Plan:  Schizophrenia -Continue Zyprexa 5 mg and 20 mg qhs -Continue prn haldol for agitation, psychosis  Anxiety -Continue Klonopin 1 mg TID  Dispo -He will return home on discharge  Haskell Riling, MD 05/21/2017, 11:47 AM

## 2017-05-21 NOTE — Tx Team (Signed)
Interdisciplinary Treatment and Diagnostic Plan Update  05/21/2017 Time of Session: 0930 David Reeves MRN: [id number]  Principal Diagnosis: Schizophrenia [facility])  Secondary Diagnoses: Principal Problem:   Schizophrenia (HCC) Active Problems:   Cannabis use disorder, moderate, dependence (HCC)   Current Medications:  Current Facility-Administered Medications  Medication Dose Route Frequency Provider Last Rate Last Dose  . acetaminophen (TYLENOL) tablet 650 mg  650 mg Oral Q6H PRN Clapacs, [name], MD   650 mg at 05/18/17 0801  . alum & mag hydroxide-simeth (MAALOX/MYLANTA) 200-200-20 MG/5ML suspension 30 mL  30 mL Oral Q4H PRN Clapacs, [name], MD   30 mL at 05/16/17 1547  . cholecalciferol (VITAMIN D) tablet 2,000 Units  2,000 Units Oral Daily Haskell Riling, MD   2,000 Units at 05/21/17 0802  . clonazePAM (KLONOPIN) tablet 1 mg  1 mg Oral TID Haskell Riling, MD   1 mg at 05/21/17 0802  . cloNIDine (CATAPRES) tablet 0.1 mg  0.1 mg Oral BID PRN [name], [name], MD      . haloperidol (HALDOL) tablet 5 mg  5 mg Oral Q6H PRN Aundria Rud, MD   5 mg at 05/21/17 4098   Or  . haloperidol lactate (HALDOL) injection 5 mg  5 mg Intramuscular Q6H PRN Aundria Rud, MD      . HYDROcodone-acetaminophen (NORCO) 7.5-325 MG per tablet 1 tablet  1 tablet Oral Q6H PRN Clapacs, [name], MD   1 tablet at 05/21/17 0601  . hydrOXYzine (ATARAX/VISTARIL) tablet 50 mg  50 mg Oral TID PRN Clapacs, [name], MD   50 mg at 05/21/17 [phone]  . magnesium hydroxide (MILK OF MAGNESIA) suspension 30 mL  30 mL Oral Daily PRN Clapacs, [name], MD   30 mL at 05/16/17 1252  . neomycin-bacitracin-polymyxin (NEOSPORIN) ointment   Topical PRN [name], MD   1 application at 05/13/17 2227  . neomycin-bacitracin-polymyxin (NEOSPORIN) ointment   Topical Daily [name], MD   1 application at 05/21/17 0802  . OLANZapine (ZYPREXA) tablet 20 mg  20 mg Oral QHS [name], [name], MD   20 mg at 05/20/17 2040   And   . OLANZapine (ZYPREXA) tablet 5 mg  5 mg Oral BH-q7a [name], [name], MD   5 mg at 05/21/17 0601  . polyethylene glycol (MIRALAX / GLYCOLAX) packet 17 g  17 g Oral Daily [name], MD   17 g at 05/21/17 0802   PTA Medications: Medications Prior to Admission  Medication Sig Dispense Refill Last Dose  . Calcium Carb-Cholecalciferol (CALCIUM+D3) 600-800 MG-UNIT TABS Take by mouth.   N/A at N/A  . celecoxib (CELEBREX) 200 MG capsule Take 1 capsule by mouth  twice daily as needed 180 capsule 2 N/A at N/A  . Cholecalciferol (VITAMIN D3) 2000 units TABS Take by mouth.   N/A at N/A  . clonazePAM (KLONOPIN) 1 MG tablet Take 2 tablets in the morning, 1 afternoon and 1 at bedtime by mouth as needed for anxiety/panic. 100 tablet 3 N/A at N/A  . Glucosamine-Chondroit-Vit C-Mn (GLUCOSAMINE CHONDR 1500 COMPLX PO) Take by mouth.   N/A at N/A  . HYDROcodone-acetaminophen (NORCO) 7.5-325 MG tablet Take 1 tablet by mouth 3 (three) times daily as needed. 90 tablet 0 N/A at N/A  . Multiple Vitamin (MULTIVITAMIN) tablet Take 1 tablet by mouth daily.   N/A at N/A  . Probiotic Product (PROBIOTIC COLON SUPPORT PO) Take by mouth.   N/A at N/A  . tiZANidine (ZANAFLEX) 4 MG tablet  TAKE 1 TABLET BY MOUTH 3  TIMES DAILY 270 tablet 1 N/A at N/A    Patient Stressors: Substance abuse Other: paranoia and delusional thoughts  Patient Strengths: Average or above average intelligence Capable of independent living Communication skills General fund of knowledge Supportive family/friends  Treatment Modalities: Medication Management, Group therapy, Case management,  1 to 1 session with clinician, Psychoeducation, Recreational therapy.   Physician Treatment Plan for Primary Diagnosis: Schizophrenia (HCC) Long Term Goal(s): Improvement in symptoms so as ready for discharge Improvement in symptoms so as ready for discharge   Short Term Goals: Ability to identify changes in lifestyle to reduce recurrence of condition  will improve Ability to verbalize feelings will improve Ability to disclose and discuss suicidal ideas Ability to demonstrate self-control will improve Ability to identify and develop effective coping behaviors will improve Ability to maintain clinical measurements within normal limits will improve Compliance with prescribed medications will improve Ability to identify triggers associated with substance abuse/mental health issues will improve Ability to identify changes in lifestyle to reduce recurrence of condition will improve Ability to verbalize feelings will improve Ability to disclose and discuss suicidal ideas Ability to demonstrate self-control will improve Ability to identify and develop effective coping behaviors will improve Ability to maintain clinical measurements within normal limits will improve Compliance with prescribed medications will improve Ability to identify triggers associated with substance abuse/mental health issues will improve  Medication Management: Evaluate patient's response, side effects, and tolerance of medication regimen.  Therapeutic Interventions: 1 to 1 sessions, Unit Group sessions and Medication administration.  Evaluation of Outcomes: Progressing  Physician Treatment Plan for Secondary Diagnosis: Principal Problem:   Schizophrenia (HCC) Active Problems:   Cannabis use disorder, moderate, dependence (HCC)  Long Term Goal(s): Improvement in symptoms so as ready for discharge Improvement in symptoms so as ready for discharge   Short Term Goals: Ability to identify changes in lifestyle to reduce recurrence of condition will improve Ability to verbalize feelings will improve Ability to disclose and discuss suicidal ideas Ability to demonstrate self-control will improve Ability to identify and develop effective coping behaviors will improve Ability to maintain clinical measurements within normal limits will improve Compliance with prescribed  medications will improve Ability to identify triggers associated with substance abuse/mental health issues will improve Ability to identify changes in lifestyle to reduce recurrence of condition will improve Ability to verbalize feelings will improve Ability to disclose and discuss suicidal ideas Ability to demonstrate self-control will improve Ability to identify and develop effective coping behaviors will improve Ability to maintain clinical measurements within normal limits will improve Compliance with prescribed medications will improve Ability to identify triggers associated with substance abuse/mental health issues will improve     Medication Management: Evaluate patient's response, side effects, and tolerance of medication regimen.  Therapeutic Interventions: 1 to 1 sessions, Unit Group sessions and Medication administration.  Evaluation of Outcomes: Progressing   RN Treatment Plan for Primary Diagnosis: Schizophrenia (HCC) Long Term Goal(s): Knowledge of disease and therapeutic regimen to maintain health will improve  Short Term Goals: Ability to verbalize feelings will improve, Ability to identify and develop effective coping behaviors will improve and Compliance with prescribed medications will improve  Medication Management: RN will administer medications as ordered by provider, will assess and evaluate patient's response and provide education to patient for prescribed medication. RN will report any adverse and/or side effects to prescribing provider.  Therapeutic Interventions: 1 on 1 counseling sessions, Psychoeducation, Medication administration, Evaluate responses to treatment, Monitor vital signs  and CBGs as ordered, Perform/monitor CIWA, COWS, AIMS and Fall Risk screenings as ordered, Perform wound care treatments as ordered.  Evaluation of Outcomes: Progressing   LCSW Treatment Plan for Primary Diagnosis: Schizophrenia (HCC) Long Term Goal(s): Safe transition to  appropriate next level of care at discharge, Engage patient in therapeutic group addressing interpersonal concerns.  Short Term Goals: Engage patient in aftercare planning with referrals and resources, Increase ability to appropriately verbalize feelings and Increase emotional regulation  Therapeutic Interventions: Assess for all discharge needs, 1 to 1 time with Social worker, Explore available resources and support systems, Assess for adequacy in community support network, Educate family and significant other(s) on suicide prevention, Complete Psychosocial Assessment, Interpersonal group therapy.  Evaluation of Outcomes: Progressing   Progress in Treatment: Attending groups: Yes. Participating in groups: Yes. Taking medication as prescribed: Yes. Toleration medication: Yes. Family/Significant other contact made: Yes, with pt's wife.  Patient understands diagnosis: Yes. Discussing patient identified problems/goals with staff: Yes. Medical problems stabilized or resolved: Yes. Denies suicidal/homicidal ideation: Yes. Issues/concerns per patient self-inventory: No. Other: None at this time.   New problem(s) identified: No, Describe:  None at this time.  New Short Term/Long Term Goal(s): Pt reported his goal for treatment is to, "go home and be with my family."   Discharge Plan or Barriers: CSW will continue to assess for an appropriate discharge plan.   Reason for Continuation of Hospitalization: Aggression Delusions  Medication stabilization  Estimated Length of Stay: 3 days  Recreational Therapy: Patient Stressors: N/A Patient Goal: Patient will attend group in a calm and appropriate manner with no impulsive behaviors x5 days.   Attendees: Patient: 05/21/2017 10:03 AM  Physician: Dr. Johnella Moloney, MD 05/21/2017 10:03 AM  Nursing: Nile Riggs, RN 05/21/2017 10:03 AM  RN Care Manager: 05/21/2017 10:03 AM  Social Worker: Heidi Dach, LCSW 05/21/2017 10:03 AM  Recreational  Therapist:  05/21/2017 10:03 AM  Other: Johny Shears, LCSWA 05/21/2017 10:03 AM  Other:  05/21/2017 10:03 AM  Other: 05/21/2017 10:03 AM    Scribe for Treatment Team: Heidi Dach, LCSW 05/21/2017 10:04 AM

## 2017-05-21 NOTE — Plan of Care (Signed)
  Progressing Education: Emotional status will improve 05/21/2017 2119 - Progressing by Galen ManilaVigil, Dhruv Christina E, RN Mental status will improve 05/21/2017 2119 - Progressing by Galen ManilaVigil, Landon Truax E, RN Coping: Ability to demonstrate self-control will improve 05/21/2017 2119 - Progressing by Galen ManilaVigil, Russ Looper E, RN

## 2017-05-21 NOTE — Progress Notes (Signed)
Recreation Therapy Notes   Date: 01.30.2018  Time: 3:00pm  Location: Craft room  Behavioral response: Appropriate  Group Type: Craft  Participation level: Active  Communication: Patient was social with peers and staff during group.  Comments: N/A  Tyresa Prindiville LRT/CTRS        Libbey Duce 05/21/2017 4:04 PM

## 2017-05-21 NOTE — Progress Notes (Signed)
Patient alert and oriented to self and place. Currently denies SI/HI/AVH but endorses back pain, prn medication administered with some effectiveness noted. Patient observed ambulating the unit with slow steady pace. Attends meetings and actively participates. Milieu remains safe with q 15 minute safety checks. Will continue to monitor and notify MD of any acute changes.

## 2017-05-21 NOTE — Progress Notes (Signed)
Recreation Therapy Notes  Date: 01.30 .2019  Time: 1:00 PM  Location: Craft Room  Behavioral response: N/A  Intervention Topic: Self-care  Discussion/Intervention: Patient did not attend group.  Clinical Observations/Feedback:  Patient did not attend group.  Karam Dunson LRT/CTRS         Rhonin Trott 05/21/2017 2:36 PM

## 2017-05-21 NOTE — Plan of Care (Signed)
Patient alert and oriented to self and place. Currently denies SI/HI/AVH but endorses back pain, prn medication administered with some effectiveness noted. Patient observed ambulating the unit with slow steady pace. Attends meetings and actively participates. Milieu remains safe with q 15 minute safety checks.

## 2017-05-21 NOTE — Progress Notes (Addendum)
Patient found in day room upon my arrival. Patient is visible and social this evening. Complains of back pain, given Norco. Complains of anxiety/agitation, given Haldol. Denies SI, HI, AVH. Reports improvement in mood. Reports medication is working well. Reports eating and voiding adequately. Q 15 minute checks maintained. Will continue to monitor throughout the shift.  [id number]- Patient awake, complaining of back pain and anxiety. Given Norco and Vistaril. Will monitor for efficacy. Patient slept 5.75 hours, no apparent distress. Compliant with am vitals. Will endorse care to oncoming shift.

## 2017-05-22 MED ORDER — OLANZAPINE 5 MG PO TABS: 5.0000 mg | ORAL_TABLET | ORAL | 0 refills | Status: DC

## 2017-05-22 MED ORDER — HALOPERIDOL 5 MG PO TABS: 5.0000 mg | ORAL_TABLET | Freq: Two times a day (BID) | ORAL | 0 refills | Status: DC | PRN

## 2017-05-22 MED ORDER — HYDROXYZINE HCL 50 MG PO TABS: 50.0000 mg | ORAL_TABLET | Freq: Three times a day (TID) | ORAL | 0 refills | Status: DC | PRN

## 2017-05-22 NOTE — [facility] Group Notes (Signed)
[facility] Group Notes:  (Nursing/MHT/Case Management/Adjunct)  Date:  05/22/2017  Time:  9:56 PM  Type of Therapy:  Group Therapy  Participation Level:  Active  Participation Quality:  Appropriate  Affect:  Appropriate  Cognitive:  Appropriate  Insight:  Appropriate  Engagement in Group:  Engaged  Modes of Intervention:  Discussion  Summary of Progress/Problems:  Burt EkJanice Marie Quantavious Eggert 05/22/2017, 9:56 PM

## 2017-05-22 NOTE — Progress Notes (Signed)
Recreation Therapy Notes   Date: 01.31.2019  Time: 1:00 PM  Location: Craft Room  Behavioral response: Appropriate  Intervention Topic: Communication  Discussion/Intervention: Group content today was focused on communication. The group defined communication and ways to communicate with others. Individuals stated reason why communication is important and some reasons to communicate with others. Patients expressed if they thought they were good at communicating with others and ways they could improve their communication skills. The group identified important parts of communication and some experiences they have had in the past with communication. The group participated in the intervention "What is that", where participants had a chance to test out their communication skills and identify ways to improve their communication techniques.  Clinical Observations/Feedback:  Patient came to group and stated when communicating volume, mood and tone is everything. He defined communication as a group of people keeping in touch. Individual was social with peers and participated in the intervention during group.  Jerrica Thorman LRT/CTRS         Asharia Lotter 05/22/2017 2:03 PM

## 2017-05-22 NOTE — Plan of Care (Signed)
Patient progressing in all areas. 

## 2017-05-22 NOTE — Progress Notes (Signed)
[facility], [place] MD Progress Note  05/22/2017 1:36 PM David Reeves  MRN:  [id number] Subjective:  Pt much calmer today. Affect is brighter. HE does not endorse any delusional or paranoid content during interview. HE has much better insight. HE states, "I feel really good. The medications I am on are really helping. Haldol really helps calm me down a lot." He has not had any anger outbursts on the unit. He is sleeping well and eating well. Denies SI, HI, AH, VH. He is looking forward to discharging soon. He likes the current medications he is on.   Principal Problem: Schizophrenia (HCC) Diagnosis:   Patient Active Problem List   Diagnosis Date Noted  . Schizophrenia (HCC) [F20.9] 05/14/2017    Priority: High  . Laceration of right hand without foreign body [S61.411A]   . Cannabis use disorder, moderate, dependence (HCC) [F12.20] 05/09/2017  . Closed compression fracture of L1 lumbar vertebra (HCC) [S32.010A] 05/24/2016  . Lumbar stenosis with neurogenic claudication [M48.062] 11/01/2015  . Chronic 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 [M54.41, G89.29] 11/08/2014  . Hx of hemorrhoids [Z87.19] 11/08/2014  . AAA (abdominal aortic aneurysm) (HCC) [I71.4] 09/22/2014  . Chronic pain associated with significant psychosocial dysfunction [G89.4] 09/22/2014  . Panic attack [F41.0] 09/22/2014  . AB (asthmatic bronchitis) [J45.909] 08/17/2014  . 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 Backache [M54.9] 08/17/2014  . Lumbosacral spondylosis without myelopathy [M47.817] 08/17/2014  . Disorder of male genital organs [N50.9] 08/17/2014  . Brash [R12] 08/17/2014  . Low back pain [M54.5] 08/17/2014  . Tendon nodule [M67.90] 08/17/2014  . 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 Hernia, inguinal, right [K40.90] 08/17/2014  . Fast heart beat [R00.0] 08/17/2014  . Illness [R69] 08/17/2014  . Inguinal hernia [K40.90] 10/14/2012   Total Time spent  with patient: 20 minutes  Past Psychiatric History: See H&P  Past Medical History:  Past Medical History:  Diagnosis Date  . Back pain 2014  . Bone spur   . Bulging disc 2014  . Degenerative disc disease   . Osteoarthritis   . Panic anxiety syndrome   . Taking multiple medications for chronic disease     Past Surgical History:  Procedure Laterality Date  . HERNIA REPAIR Left 2006,2014   [facility]  . TOE SURGERY Right 2007   Family History:  Family History  Problem Relation Age of Onset  . Diabetes Mother   . Diabetes Maternal Uncle   . Diabetes Maternal Grandmother   . Heart disease Maternal Grandmother    Family Psychiatric  History: See H&P Social History:  Social History   Substance and Sexual Activity  Alcohol Use No     Social History   Substance and Sexual Activity  Drug Use No    Social History   Socioeconomic History  . Marital status: Married    Spouse name: None  . Number of children: None  . Years of education: None  . Highest education level: None  Social Needs  . Financial resource strain: None  . Food insecurity - worry: None  . Food insecurity - inability: None  . Transportation needs - medical: None  . Transportation needs - non-medical: None  Occupational History  . None  Tobacco Use  . Smoking status: Never Smoker  . Smokeless tobacco: Never Used  Substance and Sexual Activity  . Alcohol use: No  . Drug use: No  . Sexual activity: None  Other Topics Concern  . None  Social  History Narrative  . None   Additional Social History:    Pain Medications: Vicodin Prescriptions: Vicodin, Klonopin History of alcohol / drug use?: Yes Longest period of sobriety (when/how long): Unknown Negative Consequences of Use: Personal relationships, Financial Withdrawal Symptoms: (none) Name of Substance 1: marijuana 1 - Age of First Use: unknown 1 - Amount (size/oz): bowl 1 - Frequency: multiple times daily 1 - Duration: unknown 1 - Last Use /  Amount: "a few days ago"                  Sleep: Good  Appetite:  Good  Current Medications: Current Facility-Administered Medications  Medication Dose Route Frequency Provider Last Rate Last Dose  . acetaminophen (TYLENOL) tablet 650 mg  650 mg Oral Q6H PRN Clapacs, Jackquline DenmarkJohn T, MD   650 mg at 05/18/17 0801  . alum & mag hydroxide-simeth (MAALOX/MYLANTA) 200-200-20 MG/5ML suspension 30 mL  30 mL Oral Q4H PRN Clapacs, [name], MD   30 mL at 05/16/17 1547  . cholecalciferol (VITAMIN D) tablet 2,000 Units  2,000 Units Oral Daily Haskell RilingMcNew, Erbie Arment R, MD   2,000 Units at 05/22/17 [phone]  . clonazePAM (KLONOPIN) tablet 1 mg  1 mg Oral TID Haskell RilingMcNew, [place] Steidle R, MD   1 mg at 05/22/17 1211  . cloNIDine (CATAPRES) tablet 0.1 mg  0.1 mg Oral BID PRN Leafy Motsinger, Ileene HutchinsonHolly R, MD      . haloperidol (HALDOL) tablet 5 mg  5 mg Oral Q6H PRN Aundria Rudakesh, Gopalkumar, MD   5 mg at 05/21/17 2037   Or  . haloperidol lactate (HALDOL) injection 5 mg  5 mg Intramuscular Q6H PRN Aundria Rudakesh, Gopalkumar, MD      . HYDROcodone-acetaminophen (NORCO) 7.5-325 MG per tablet 1 tablet  1 tablet Oral Q6H PRN Clapacs, Jackquline DenmarkJohn T, MD   1 tablet at 05/22/17 1211  . hydrOXYzine (ATARAX/VISTARIL) tablet 50 mg  50 mg Oral TID PRN Clapacs, Jackquline DenmarkJohn T, MD   50 mg at 05/22/17 0542  . magnesium hydroxide (MILK OF MAGNESIA) suspension 30 mL  30 mL Oral Daily PRN Clapacs, Jackquline DenmarkJohn T, MD   30 mL at 05/16/17 1252  . neomycin-bacitracin-polymyxin (NEOSPORIN) ointment   Topical PRN Beverly SessionsSubedi, Jagannath, MD   1 application at 05/13/17 2227  . neomycin-bacitracin-polymyxin (NEOSPORIN) ointment   Topical Daily Henrene DodgePiscoya, Jose, MD   1 application at 05/22/17 [phone]  . OLANZapine (ZYPREXA) tablet 20 mg  20 mg Oral QHS Jaydon Avina, Ileene HutchinsonHolly R, MD   20 mg at 05/21/17 2037   And  . OLANZapine (ZYPREXA) tablet 5 mg  5 mg Oral BH-q7a Autum Benfer, Ileene HutchinsonHolly R, MD   5 mg at 05/22/17 [phone]  . polyethylene glycol (MIRALAX / GLYCOLAX) packet 17 g  17 g Oral Daily Beverly SessionsSubedi, Jagannath, MD   17 g at 05/22/17 [phone]    Lab  Results: No results found for this or any previous visit (from the past 48 hour(s)).  Blood Alcohol level:  Lab Results  Component Value Date   ETH <10 05/08/2017    Metabolic Disorder Labs: Lab Results  Component Value Date   HGBA1C 4.8 05/10/2017   MPG 91.06 05/10/2017   No results found for: PROLACTIN Lab Results  Component Value Date   CHOL 162 05/10/2017   TRIG 70 05/10/2017   HDL 38 (L) 05/10/2017   CHOLHDL 4.3 05/10/2017   VLDL 14 05/10/2017   LDLCALC 110 (H) 05/10/2017   LDLCALC 112 (H) 11/13/2015    Physical Findings: AIMS: Facial and Oral Movements Muscles of Facial  Expression: None, normal Lips and Perioral Area: None, normal Jaw: None, normal Tongue: None, normal,Extremity Movements Upper (arms, wrists, hands, fingers): None, normal Lower (legs, knees, ankles, toes): None, normal, Trunk Movements Neck, shoulders, hips: None, normal, Overall Severity Severity of abnormal movements (highest score from questions above): None, normal Incapacitation due to abnormal movements: None, normal Patient's awareness of abnormal movements (rate only patient's report): No Awareness, Dental Status Current problems with teeth and/or dentures?: No Does patient usually wear dentures?: No  CIWA:  CIWA-Ar Total: 0 COWS:  COWS Total Score: 0  Musculoskeletal: Strength & Muscle Tone: within normal limits Gait & Station: normal Patient leans: N/A  Psychiatric Specialty Exam: Physical Exam  Nursing note and vitals reviewed.   Review of Systems  All other systems reviewed and are negative.   Blood pressure 130/81, pulse 74, temperature 98.4 F (36.9 C), temperature source Oral, resp. rate 18, height 5\' 9"  (1.753 m), weight 83 kg (183 lb), SpO2 98 %.Body mass index is 27.02 kg/m.  General Appearance: Casual  Eye Contact:  Good  Speech:  Clear and Coherent  Volume:  Normal  Mood:  Euthymic  Affect:  Appropriate  Thought Process:  Coherent and Goal Directed   Orientation:  Full (Time, Place, and Person)  Thought Content:  Logical  Suicidal Thoughts:  No  Homicidal Thoughts:  No  Memory:  Immediate;   Fair  Judgement:  Fair  Insight:  Fair-much improved  Psychomotor Activity:  Normal  Concentration:  Concentration: Fair  Recall:  [employer] of Knowledge:  Fair  Language:  Fair  Akathisia:  No      Assets:  Communication Skills Desire for Improvement Housing Resilience  ADL's:  Intact  Cognition:  WNL  Sleep:  Number of Hours: 5.75     Treatment Plan Summary: 47 yo male admitted due to delusions and paranoia. Symptoms have improved significantly and has not endorsed any delusional content today. He is doing better on higher dose of Zyprexa. He also asks for prn Haldol which he feels significantly helps when he feels agitation. Pt appears to require higher doses of anti-psychotics and does do much better on days he takes Haldol so benefits outweigh risks at this time for being on 2 anti-psychotics. He was extremely delusional on arrival and to the point of being a danger to himself. HE has improved significantly on current medications.   Schizophrenia -Continue Zyprexa 5 mg qam and 20 mg qhs -Continue prn Haldol  Anxiety -Continue Klonopin 1 mg TID  Dispo -He will return home on discharge, Likely discharge tomorrow.    Haskell Riling, MD 05/22/2017, 1:36 PM

## 2017-05-22 NOTE — [facility] Group Notes (Signed)
05/22/2017  Time:  0930  Type of Therapy/Topic:  Group Therapy:  Balance in Life  Participation Level:  Active  Description of Group:   This group will address the concept of balance and how it feels and looks when one is unbalanced. Patients will be encouraged to process areas in their lives that are out of balance and identify reasons for remaining unbalanced. Facilitators will guide patients in utilizing problem-solving interventions to address and correct the stressor making their life unbalanced. Understanding and applying boundaries will be explored and addressed for obtaining and maintaining a balanced life. Patients will be encouraged to explore ways to assertively make their unbalanced needs known to significant others in their lives, using other group members and facilitator for support and feedback.  Therapeutic Goals: 1. Patient will identify two or more emotions or situations they have that consume much of in their lives. 2. Patient will identify signs/triggers that life has become out of balance:  3. Patient will identify two ways to set boundaries in order to achieve balance in their lives:  4. Patient will demonstrate ability to communicate their needs through discussion and/or role plays  Summary of Patient Progress: Pt continues to work towards their tx goals but has not yet reached them. Pt was able to appropriately participate in group discussion, and was able to offer support/validation to other group members. Pt reported feeling, "excellent today because they've adjusted my meds and I'm feeling like myself again." Pt reported he feels that, "anger and anxiety," have consumed too much of his life. Pt stated a sign that his life has become out of balance is, "I push others away." Pt reported two ways he can maintain a better balance in life are, "being outside and saying what I'm feeling."   Therapeutic Modalities:   Cognitive Behavioral Therapy Solution-Focused  Therapy Assertiveness Training  Heidi DachKelsey Branndon Tuite, MSW, LCSW 05/22/2017 10:27 AM

## 2017-05-22 NOTE — Plan of Care (Signed)
  Progressing Education: Knowledge of [facility] Education information/materials will improve 05/22/2017 2123 - Progressing by Galen ManilaVigil, Deangela Randleman E, RN Emotional status will improve 05/22/2017 2123 - Progressing by Galen ManilaVigil, Roselani Grajeda E, RN Mental status will improve 05/22/2017 2123 - Progressing by Galen ManilaVigil, Ramon Brant E, RN Verbalization of understanding the information provided will improve 05/22/2017 2123 - Progressing by Galen ManilaVigil, Eduardo Wurth E, RN Coping: Ability to demonstrate self-control will improve 05/22/2017 2123 - Progressing by Galen ManilaVigil, Hoorain Kozakiewicz E, RN Health Behavior/Discharge Planning: Compliance with treatment plan for underlying cause of condition will improve 05/22/2017 2123 - Progressing by Galen ManilaVigil, Almyra Birman E, RN Education: Will be free of psychotic symptoms 05/22/2017 2123 - Progressing by Galen ManilaVigil, Robbi Scurlock E, RN Nutritional: Ability to achieve adequate nutritional intake will improve 05/22/2017 2123 - Progressing by Galen ManilaVigil, Lanika Colgate E, RN Safety: Ability to redirect hostility and anger into socially appropriate behaviors will improve 05/22/2017 2123 - Progressing by Galen ManilaVigil, Teryn Boerema E, RN Ability to remain free from injury will improve 05/22/2017 2123 - Progressing by Galen ManilaVigil, Leith Szafranski E, RN Self-Care: Ability to participate in self-care as condition permits will improve 05/22/2017 2123 - Progressing by Galen ManilaVigil, Ahtziry Saathoff E, RN [facility] Participation in Recreation Therapeutic Interventions ST[facility]-Other Recreation Therapy Goal (Specify) Description Patient will attend group in a calm and appropriate manner with no impulsive behaviors x5 days.  05/22/2017 2123 - Progressing by Galen ManilaVigil, Jemimah Cressy E, RN Activity: Sleeping patterns will improve 05/22/2017 2123 - Progressing by Galen ManilaVigil, Karl Erway E, RN Safety: Periods of time without injury will increase 05/22/2017 2123 - Progressing by Galen ManilaVigil, Hasten Sweitzer E, RN

## 2017-05-22 NOTE — [facility] Group Notes (Signed)
LCSW Group Therapy Note 05/22/2017 9:00 AM  Type of Therapy and Topic:  Group Therapy:  Setting Goals  Participation Level:  Active  Description of Group: In this process group, patients discussed using strengths to work toward goals and address challenges.  Patients identified two positive things about themselves and one goal they were working on.  Patients were given the opportunity to share openly and support each other's plan for self-empowerment.  The group discussed the value of gratitude and were encouraged to have a daily reflection of positive characteristics or circumstances.  Patients were encouraged to identify a plan to utilize their strengths to work on current challenges and goals.  Therapeutic Goals 1. Patient will verbalize personal strengths/positive qualities and relate how these can assist with achieving desired personal goals 2. Patients will verbalize affirmation of peers plans for personal change and goal setting 3. Patients will explore the value of gratitude and positive focus as related to successful achievement of goals 4. Patients will verbalize a plan for regular reinforcement of personal positive qualities and circumstances.  Summary of Patient Progress:  David Reeves was able to actively participate in group today.  He shared with group members what he felt SMART represented to him as well as gave some examples of how he has tried to develop his own SMART goals in the past.  David Reeves shared that his goal for today is "reconnect with some of my old friends while I'm in the hospital".      Therapeutic Modalities Cognitive Behavioral Therapy Motivational Interviewing    Alease FrameSonya S Charnelle Bergeman, Alexander MtLCSW 05/22/2017 12:53 PM

## 2017-05-22 NOTE — Progress Notes (Signed)
D: Pt visible in milieu at intervals during shift. Denies SI, HI and AVH. Presents animated on approach / when engaged. Rates his depression 4/10 and anxiety 6/10 when assessed. Stated to Clinical research associatewriter during medication pass "I think the medication regimen I'm on now is working except for that Vistaril though, It don't help that much but it's ok". Reports he's sleeping and tolerating meals well.  A: Scheduled and PRN (Norco-back pain) medications administered as ordered with verbal education and effects monitored. Emotional support offered to pt. Safety checks maintained without self harm gestures or outburst.  R: Pt receptive to care, cooperative with unit routines. Attendded scheduled unit groups when prompted and was verbally engaged. POC continues for mood stability and safety.

## 2017-05-22 NOTE — [facility] Group Notes (Signed)
[facility] Group Notes:  (Nursing/MHT/Case Management/Adjunct)  Date:  05/22/2017  Time:  2:33 PM  Type of Therapy:  Psychoeducational Skills  Participation Level:  Active  Participation Quality:  Appropriate  Affect:  Appropriate  Cognitive:  Appropriate  Insight:  Appropriate  Engagement in Group:  Engaged  Modes of Intervention:  Discussion, Education and Support  Summary of Progress/Problems:  Lynelle SmokeCara Travis [facility] CenterMadoni 05/22/2017, 2:33 PM

## 2017-05-22 NOTE — Progress Notes (Signed)
Recreation Therapy Notes   Date: 01.31.2018  Time: 3:00pm  Location: Craft room  Behavioral response: Appropriate  Group Type: Craft  Participation level: Active  Communication: Patient was social with peers and staff during group.   Comments: Patient left group early he stated he is in pain and his medication is wearing off.   Maude Hettich LRT/CTRS        Azeneth Carbonell 05/22/2017 4:11 PM

## 2017-05-22 NOTE — Progress Notes (Addendum)
Patient found in day room upon my arrival. Patient is visible and social throughout the evening. Attended group. Reports eating and voiding adequately. Complains of anxiety, given Haldol and Vistaril with positive results. Denies SI, HI, AVH. Continues to believe someone inserted a Fentanyl device into his rectum. Reports improvement in mood and paranoia. Compliant with HS medications and staff direction. Q 15 minute checks maintained. Will continue to monitor throughout the shift.  Patient slept 5.25 hours. No apparent distress. Patient reports quality of sleep was improved. Complains of anxiety/agitation upon waking, given Haldol and Vistaril PRN. Complains of back pain, given Norco. Will monitor for efficacy. Will endorse care to oncoming shift.

## 2017-05-23 NOTE — Progress Notes (Signed)
Recreation Therapy Notes  INPATIENT RECREATION TR PLAN  Patient Details Name: David Reeves MRN: [id number] DOB: June 23, 1970 Today's Date: 05/23/2017  Rec Therapy Plan Is patient appropriate for Therapeutic Recreation?: Yes Treatment times per week: at least 3 Estimated Length of Stay: 5-7 days TR Treatment/Interventions: Group participation (Comment)  Discharge Criteria Pt will be discharged from therapy if:: Discharged Treatment plan/goals/alternatives discussed and agreed upon by:: Patient/family  Discharge Summary Short term goals set: Patient will identify 3 positive coping skills to decrease depressive symptoms x5 days.  Short term goals met: Adequate for discharge Progress toward goals comments: Groups attended Which groups?: Communication(Values,Problem Solving) Reason goals not met: N/A Therapeutic equipment acquired: N/A Reason patient discharged from therapy: Discharge from hospital Pt/family agrees with progress & goals achieved: Yes Date patient discharged from therapy: 05/23/17   Kourtlyn Charlet 05/23/2017, 3:05 PM
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
## 2017-05-23 NOTE — Progress Notes (Signed)
  [facility] :  Will you be returning to the same living situation after discharge:  Yes,  returning home with wife and daughter. At discharge, do you have transportation home?: Yes,  pt's wife.  Do you have the ability to pay for your medications: Yes,  insurance  Release of information consent forms completed and in the chart;  Patient's signature needed at discharge.  Patient to Follow up at: Follow-up Information    [facility]. Go on 05/26/2017.   Why:  Please go to your hospital follow up appointment on Monday, 05/26/17 at 1PM. Thank you! (Please bring a copy of your insurance card).  Contact information: [address] [place], [place] [address]  P: [phone] F: [phone]          Next level of care provider [facility]:yes  Safety Planning and Suicide Prevention discussed: Yes,  with pt and his wife.  Have you used any form of tobacco in the last 30 days? (Cigarettes, Smokeless Tobacco, Cigars, and/or Pipes): No  Has patient been referred to the Quitline?: N/A patient is not a smoker  Patient has been referred for addiction treatment: N/A  Heidi DachKelsey Aarian Griffie, LCSW 05/23/2017, 8:59 AM

## 2017-05-23 NOTE — Progress Notes (Signed)
Denies SI/HI/AVH.  Endorses anxiety.   Discharge instructions given, verbalized understanding.  Prescriptions given and personal belongings returned.  Escorted off unit by this Clinical research associatewriter to main entrance to travel home with spouse.

## 2017-05-23 NOTE — [facility] Suicide Risk Assessment (Signed)
[facility]   Principal Problem: Schizophrenia [facility](HCC) Discharge Diagnoses:  Patient Active Problem List   Diagnosis Date Noted  . Schizophrenia (HCC) [F20.9] 05/14/2017    Priority: High  . Laceration of right hand without foreign body [S61.411A]   . Cannabis use disorder, moderate, dependence (HCC) [F12.20] 05/09/2017  . Closed compression fracture of L1 lumbar vertebra (HCC) [S32.010A] 05/24/2016  . Lumbar stenosis with neurogenic claudication [M48.062] 11/01/2015  . Chronic 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 [M54.41, G89.29] 11/08/2014  . Hx of hemorrhoids [Z87.19] 11/08/2014  . AAA (abdominal aortic aneurysm) (HCC) [I71.4] 09/22/2014  . Chronic pain associated with significant psychosocial dysfunction [G89.4] 09/22/2014  . Panic attack [F41.0] 09/22/2014  . AB (asthmatic bronchitis) [J45.909] 08/17/2014  . 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 Backache [M54.9] 08/17/2014  . Lumbosacral spondylosis without myelopathy [M47.817] 08/17/2014  . Disorder of male genital organs [N50.9] 08/17/2014  . Brash [R12] 08/17/2014  . Low back pain [M54.5] 08/17/2014  . Tendon nodule [M67.90] 08/17/2014  . 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 Hernia, inguinal, right [K40.90] 08/17/2014  . Fast heart beat [R00.0] 08/17/2014  . Illness [R69] 08/17/2014  . Inguinal hernia [K40.90] 10/14/2012      Mental Status Per Nursing Assessment::   On Admission:  NA  Demographic Factors:  Male, Caucasian and Unemployed  Loss Factors: NA  Historical Factors: NA  Risk Reduction Factors:   Living with another person, especially a relative, Positive social support, Positive therapeutic relationship and Positive coping skills or problem solving skills  Continued Clinical Symptoms:  Mild paranoia and delusions  Cognitive Features That Contribute To Risk:  None    Suicide Risk:  Minimal: No  identifiable suicidal ideation.  Patients presenting with no risk factors but with morbid ruminations; may be classified as minimal risk based on the severity of the depressive symptoms  Follow-up Information    [facility]. Go on 05/26/2017.   Why:  Please go to your hospital follow up appointment on Monday, 05/26/17 at 1PM. Thank you! (Please bring a copy of your insurance card).  Contact information: [address] [place], [place] [address]  P: [phone]) [phone] F: [phone]          Plan Of Care/Follow-up recommendations: See above for follow up information  Haskell RilingHolly R Jawara Latorre, MD 05/23/2017, 9:02 AM

## 2017-05-23 NOTE — [facility] Group Notes (Signed)
05/23/2017 9:30AM  Type of Therapy and Topic:  Group Therapy:  Feelings around Relapse and Recovery  Participation Level:  Did Not Attend   Description of Group:    Patients in this group will discuss emotions they experience before and after a relapse. They will process how experiencing these feelings, or avoidance of experiencing them, relates to having a relapse. Facilitator will guide patients to explore emotions they have related to recovery. Patients will be encouraged to process which emotions are more powerful. They will be guided to discuss the emotional reaction significant others in their lives may have to patients' relapse or recovery. Patients will be assisted in exploring ways to respond to the emotions of others without this contributing to a relapse.  Therapeutic Goals: 1. Patient will identify two or more emotions that lead to a relapse for them 2. Patient will identify two emotions that result when they relapse 3. Patient will identify two emotions related to recovery 4. Patient will demonstrate ability to communicate their needs through discussion and/or role plays   Summary of Patient Progress: Patient was encouraged and invited to attend group. Patient did not attend group. Social worker will continue to encourage group participation in the future.     Therapeutic Modalities:   Cognitive Behavioral Therapy Solution-Focused Therapy Assertiveness Training Relapse Prevention Therapy   Johny ShearsCassandra  Jonavin Seder, LCSW 05/23/2017 10:23 AM

## 2017-05-26 ENCOUNTER — Ambulatory Visit: Payer: Managed Care, Other (non HMO) | Admitting: Psychiatry

## 2017-06-02 ENCOUNTER — Encounter: Payer: Self-pay | Admitting: Family Medicine

## 2017-06-02 ENCOUNTER — Ambulatory Visit (INDEPENDENT_AMBULATORY_CARE_PROVIDER_SITE_OTHER): Payer: Managed Care, Other (non HMO) | Admitting: Family Medicine

## 2017-06-02 VITALS — BP 112/68 | HR 88 | Temp 98.2°F | Resp 16 | Wt 205.0 lb

## 2017-06-02 DIAGNOSIS — K640 First degree hemorrhoids: Secondary | ICD-10-CM

## 2017-06-02 DIAGNOSIS — B079 Viral wart, unspecified: Secondary | ICD-10-CM

## 2017-06-02 DIAGNOSIS — F200 Paranoid schizophrenia: Secondary | ICD-10-CM

## 2017-06-02 NOTE — Progress Notes (Signed)
Patient: David Reeves Male    DOB: 07/18/1970   47 y.o.   MRN: [id number] Visit Date: 06/02/2017  Today's Provider: Dortha Kern, PA   Chief Complaint  Patient presents with  . Hospitalization Follow-up   Subjective:    HPI    Follow up Hospitalization  Patient was admitted to [facility] on 05/09/2017 and discharged on 05/23/2017. He was treated for Schizophrenia and brief psychotic disorders . Treatment for this included psychotherapy, Haldol, Hydroxyzine and Zyprexa. Tizanidine was discontinued. Psychiatry follow up appointment was on 05-26-17 at 1pm. He reports excellent compliance with treatment. He reports this condition is Improved.  ------------------------------------------------------------------------------------  Past Medical History:  Diagnosis Date  . Back pain 2014  . Bone spur   . Bulging disc 2014  . Degenerative disc disease   . Osteoarthritis   . Panic anxiety syndrome   . Taking multiple medications for chronic disease    Past Surgical History:  Procedure Laterality Date  . HERNIA REPAIR Left 2006,2014   [facility]  . TOE SURGERY Right 2007   Family History  Problem Relation Age of Onset  . Diabetes Mother   . Diabetes Maternal Uncle   . Diabetes Maternal Grandmother   . Heart disease Maternal Grandmother    Allergies  Allergen Reactions  . Amitriptyline Other (See Comments)    Urine retention  . Buspirone Other (See Comments)    Kidney pain  . Ciprofloxacin Other (See Comments)    hallucinations  . Doxepin Other (See Comments)    "Didn't feel right"  . Duloxetine Hcl Other (See Comments)    Sweating  . Escitalopram Other (See Comments)    Agitation  . Penicillins Other (See Comments)    Unknown -- childhood reaction  . Sertraline Other (See Comments)    Urine retention  . Trazodone Other (See Comments)    abd pain, urine retention    Current Outpatient Medications:  .  Calcium Carb-Cholecalciferol (CALCIUM+D3) 600-800  MG-UNIT TABS, Take by mouth., Disp: , Rfl:  .  celecoxib (CELEBREX) 200 MG capsule, Take 1 capsule by mouth  twice daily as needed, Disp: 180 capsule, Rfl: 2 .  Cholecalciferol (VITAMIN D3) 2000 units TABS, Take by mouth., Disp: , Rfl:  .  clonazePAM (KLONOPIN) 1 MG tablet, Take 2 tablets in the morning, 1 afternoon and 1 at bedtime by mouth as needed for anxiety/panic., Disp: 100 tablet, Rfl: 3 .  Glucosamine-Chondroit-Vit C-Mn (GLUCOSAMINE CHONDR 1500 COMPLX PO), Take by mouth., Disp: , Rfl:  .  haloperidol (HALDOL) 5 MG tablet, Take 1 tablet (5 mg total) by mouth 2 (two) times daily as needed for agitation (Take when feeling agitated)., Disp: 60 tablet, Rfl: 0 .  HYDROcodone-acetaminophen (NORCO) 7.5-325 MG tablet, Take 1 tablet by mouth 3 (three) times daily as needed., Disp: 90 tablet, Rfl: 0 .  hydrOXYzine (ATARAX/VISTARIL) 50 MG tablet, Take 1 tablet (50 mg total) by mouth 3 (three) times daily as needed for anxiety., Disp: 60 tablet, Rfl: 0 .  Multiple Vitamin (MULTIVITAMIN) tablet, Take 1 tablet by mouth daily., Disp: , Rfl:  .  OLANZapine (ZYPREXA) 5 MG tablet, Take 1-4 tablets (5-20 mg total) by mouth See admin instructions. Take 1 tablet (5 mg) in the morning and 4 tablets (20 mg) at bedtime, Disp: 150 tablet, Rfl: 0 .  Probiotic Product (PROBIOTIC COLON SUPPORT PO), Take by mouth., Disp: , Rfl:   Review of Systems  Constitutional: Negative.   Respiratory: Negative.   Cardiovascular: Negative.  Gastrointestinal: Positive for constipation. Negative for abdominal distention, abdominal pain, anal bleeding, blood in stool, diarrhea, nausea, rectal pain and vomiting.  Musculoskeletal: Positive for myalgias (Muscle spasms). Negative for arthralgias, back pain, gait problem, joint swelling, neck pain and neck stiffness.  Neurological: Negative for dizziness, light-headedness and headaches.   Social History   Tobacco Use  . Smoking status: Never Smoker  . Smokeless tobacco: Never Used    Substance Use Topics  . Alcohol use: No   Objective:   BP 112/68 (BP Location: Right Arm, Patient Position: Sitting, Cuff Size: Normal)   Pulse 88   Temp 98.2 F (36.8 C) (Oral)   Resp 16   Wt 205 lb (93 kg)   BMI 30.27 kg/m  Vitals:   06/02/17 0847  BP: 112/68  Pulse: 88  Resp: 16  Temp: 98.2 F (36.8 C)  TempSrc: Oral  Weight: 205 lb (93 kg)    Physical Exam  Constitutional: He is oriented to person, place, and time. He appears well-developed and well-nourished. No distress.  HENT:  Head: Normocephalic and atraumatic.  Right Ear: Hearing normal.  Left Ear: Hearing normal.  Nose: Nose normal.  Eyes: Conjunctivae and lids are normal. Right eye exhibits no discharge. Left eye exhibits no discharge. No scleral icterus.  Neck: Neck supple.  Cardiovascular: Normal rate and regular rhythm.  Pulmonary/Chest: Effort normal and breath sounds normal. No respiratory distress.  Abdominal: Soft. Bowel sounds are normal.  Musculoskeletal: He exhibits tenderness.  Stiffness of spine with tenderness to palpation and check ROM. Unchanged chronic pain with movement and sitting or standing too long.   Neurological: He is alert and oriented to person, place, and time.  Skin: Skin is intact. No lesion and no rash noted.  Large cauliflower-topped wart on the right middle finger at the medial side of the DIP joint.  Psychiatric: His speech is normal. His mood appears anxious. He is agitated and aggressive. Thought content is paranoid.      Assessment & Plan:     1. Paranoid schizophrenia (HCC) Hospitalized under IVC to Behavioral Medicine for psychosis brought to the ED from [facility] on 05-09-17 and discharged 05-23-17 with discharge diagnosis of schizophrenia. He has used very little of the Zyprexa because of side effects of foggy sensation. Intermittently will use the Haldol 5 mg 1/4 tablet. Did not go to the psychiatry follow up appointment on 05-26-17 with Dr. Johnella MoloneyMcNew to adjust medications.  Encouraged to reschedule follow up and gave him the phone number and address for the [facility]. Seems to be focused and cooperative today. Recheck pending follow up report with psychiatrist.  2. Grade I hemorrhoids Patient has felt he has had some foul liquid from a "fissure" occasionally. On anoscopic exam, no fissure found. Did see a few small purple hemorrhoids that were soft without clots and non-tender. No active bleeding. Recommend hot Epsom Saltwater baths and may use Witch Hazel to cleanse area. Continue stool softener to reduce constipation episodes. Recheck if any swelling or pain.  3. Viral wart on finger 0.8 cm wart on the right middle finger. Used Cryo-Pen to freeze the wart for 90 seconds. Good frost ball effect and applied Band-Aid dressing. Recheck in 2 weeks to be sure another treatment is not needed.       Dortha Kernennis Mario Voong, PA  [facility]

## 2017-06-10 ENCOUNTER — Other Ambulatory Visit: Payer: Self-pay

## 2017-06-10 DIAGNOSIS — M48062 Spinal stenosis, lumbar region with neurogenic claudication: Secondary | ICD-10-CM

## 2017-06-10 DIAGNOSIS — S32010K Wedge compression fracture of first lumbar vertebra, subsequent encounter for fracture with nonunion: Secondary | ICD-10-CM

## 2017-06-10 MED ORDER — HYDROCODONE-ACETAMINOPHEN 7.5-325 MG PO TABS
1.0000 | ORAL_TABLET | Freq: Three times a day (TID) | ORAL | 0 refills | Status: DC | PRN
Start: 2017-06-10 — End: 2017-07-10

## 2017-06-10 NOTE — Telephone Encounter (Signed)
Patient advised RX is at the front desk for pickup.  

## 2017-06-10 NOTE — Telephone Encounter (Signed)
Patient is called office requesting refill on Hydrocone-Acetaminophen, he request a call back when available for pick up. KW

## 2017-06-11 ENCOUNTER — Telehealth: Payer: Self-pay | Admitting: Family Medicine

## 2017-06-11 NOTE — Telephone Encounter (Signed)
[facility] [address]  stating that you are aware that he is on an opoid and a benzodiazepine in order for him to get his medications

## 2017-06-12 NOTE — Telephone Encounter (Signed)
Pt called to check on the status of his request for notice to be [facility] [address] that Maurine MinisterDennis is aware pt is taking an opoid and a benzo medications. Pt was advised that Maurine MinisterDennis was out of the office yesterday. Please advise. Thanks TNP

## 2017-06-12 NOTE — Telephone Encounter (Signed)
[name] spoke with patient.  

## 2017-06-12 NOTE — Telephone Encounter (Signed)
Tried to call the pharmacy but had to leave a message. Called patient to let him know and had to leave a message there, also. Apparently by the pharmacy's fax requested a new prescription for the Hydrocodone

## 2017-06-16 ENCOUNTER — Ambulatory Visit (INDEPENDENT_AMBULATORY_CARE_PROVIDER_SITE_OTHER): Payer: Medicare Other | Admitting: Family Medicine

## 2017-06-16 ENCOUNTER — Encounter: Payer: Self-pay | Admitting: Family Medicine

## 2017-06-16 VITALS — BP 102/60 | HR 57 | Temp 97.8°F | Wt 208.2 lb

## 2017-06-16 DIAGNOSIS — B079 Viral wart, unspecified: Secondary | ICD-10-CM | POA: Diagnosis not present

## 2017-06-16 NOTE — Progress Notes (Signed)
Patient: David Reeves Male    DOB: May 20, 1970   47 y.o.   MRN: [id number] Visit Date: 06/16/2017  Today's Provider: Dortha Kern, PA   Chief Complaint  Patient presents with  . wart follow up   Subjective:    HPI Viral wart on finger: Patient presents for a 2 week follow up. Last OV was on 06/02/17. Wart was froze off using the Cryo-Pen. Patient advised to follow up to be sure another treatment is not needed. Patient reports the wart came back and is now bigger than before.     Patient Active Problem List   Diagnosis Date Noted  . Schizophrenia (HCC) 05/14/2017  . Laceration of right hand without foreign body   . Cannabis use disorder, moderate, dependence (HCC) 05/09/2017  . Closed compression fracture of L1 lumbar vertebra (HCC) 05/24/2016  . Lumbar stenosis with neurogenic claudication 11/01/2015  . Chronic 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 11/08/2014  . Hx of hemorrhoids 11/08/2014  . AAA (abdominal aortic aneurysm) (HCC) 09/22/2014  . Chronic pain associated with significant psychosocial dysfunction 09/22/2014  . Panic attack 09/22/2014  . AB (asthmatic bronchitis) 08/17/2014  . Anxiety 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 08/17/2014  . Backache 08/17/2014  . Lumbosacral spondylosis without myelopathy 08/17/2014  . 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 Brash 08/17/2014  . Low back pain 08/17/2014  . Tendon nodule 08/17/2014  . Episodic paroxysmal anxiety disorder 08/17/2014  . Hernia, inguinal, right 08/17/2014  . Fast heart beat 08/17/2014  . Illness 08/17/2014  . Inguinal hernia 10/14/2012   Past Surgical History:  Procedure Laterality Date  . HERNIA REPAIR Left 2006,2014   [facility]  . TOE SURGERY Right 2007   Family History  Problem Relation Age of Onset  . Diabetes Mother   . Diabetes Maternal Uncle   . Diabetes Maternal Grandmother   . Heart disease Maternal Grandmother    Allergies  Allergen Reactions  .  Duloxetine     Other reaction(s): Other (See Comments) Increased temperature, sweating, uncontrolled shaking, aggressive thoughts  . Amitriptyline Other (See Comments)    Urine retention  . Buspirone Other (See Comments)    Kidney pain  . Ciprofloxacin Other (See Comments)    hallucinations  . Doxepin Other (See Comments)    "Didn't feel right"  . Escitalopram Other (See Comments)    Agitation  . Penicillins Other (See Comments)    Unknown -- childhood reaction  . Sertraline Other (See Comments)    Urine retention  . Trazodone Other (See Comments)    abd pain, urine retention     Current Outpatient Medications:  .  Calcium Carb-Cholecalciferol (CALCIUM+D3) 600-800 MG-UNIT TABS, Take by mouth., Disp: , Rfl:  .  celecoxib (CELEBREX) 200 MG capsule, Take 1 capsule by mouth  twice daily as needed, Disp: 180 capsule, Rfl: 2 .  Cholecalciferol (VITAMIN D3) 2000 units TABS, Take by mouth., Disp: , Rfl:  .  clonazePAM (KLONOPIN) 1 MG tablet, Take 2 tablets in the morning, 1 afternoon and 1 at bedtime by mouth as needed for anxiety/panic., Disp: 100 tablet, Rfl: 3 .  Glucosamine-Chondroit-Vit C-Mn (GLUCOSAMINE CHONDR 1500 COMPLX PO), Take by mouth., Disp: , Rfl:  .  haloperidol (HALDOL) 5 MG tablet, Take 1 tablet (5 mg total) by mouth 2 (two) times daily as needed for agitation (Take when feeling agitated)., Disp: 60 tablet, Rfl: 0 .  HYDROcodone-acetaminophen (NORCO) 7.5-325 MG tablet, Take 1 tablet by mouth  3 (three) times daily as needed., Disp: 90 tablet, Rfl: 0 .  hydrOXYzine (ATARAX/VISTARIL) 50 MG tablet, Take 1 tablet (50 mg total) by mouth 3 (three) times daily as needed for anxiety., Disp: 60 tablet, Rfl: 0 .  Multiple Vitamin (MULTIVITAMIN) tablet, Take 1 tablet by mouth daily., Disp: , Rfl:  .  OLANZapine (ZYPREXA) 5 MG tablet, Take 1-4 tablets (5-20 mg total) by mouth See admin instructions. Take 1 tablet (5 mg) in the morning and 4 tablets (20 mg) at bedtime, Disp: 150 tablet,  Rfl: 0 .  Probiotic Product (PROBIOTIC COLON SUPPORT PO), Take by mouth., Disp: , Rfl:   Review of Systems  Constitutional: Negative.   Respiratory: Negative.   Cardiovascular: Negative.     Social History   Tobacco Use  . Smoking status: Never Smoker  . Smokeless tobacco: Never Used  Substance Use Topics  . Alcohol use: No   Objective:   BP 102/60 (BP Location: Right Arm, Patient Position: Sitting, Cuff Size: Normal)   Pulse (!) 57   Temp 97.8 F (36.6 C) (Oral)   Wt 208 lb 3.2 oz (94.4 kg)   SpO2 98%   BMI 30.75 kg/m    Physical Exam  Constitutional: He is oriented to person, place, and time. He appears well-developed and well-nourished. No distress.  HENT:  Head: Normocephalic and atraumatic.  Right Ear: Hearing normal.  Left Ear: Hearing normal.  Nose: Nose normal.  Eyes: Conjunctivae and lids are normal. Right eye exhibits no discharge. Left eye exhibits no discharge. No scleral icterus.  Pulmonary/Chest: Effort normal. No respiratory distress.  Neurological: He is alert and oriented to person, place, and time.  Skin: Skin is intact. No lesion and no rash noted.  0.6 cm scabbed lesion on right middle finger at the DIP joint - radial side.  Psychiatric: He has a normal mood and affect. His speech is normal and behavior is normal. Thought content normal.  Calmer today without panic.      Assessment & Plan:     1. Viral wart on finger Lesion smaller (approximately 0.6 cm) and flatter. Will freeze again with Cryo-Pen and recheck in 2-3 weeks if needed. May use Band-Aid dressing as needed.       Dortha Kernennis Chrismon, PA  [facility]

## 2017-07-09 ENCOUNTER — Telehealth: Payer: Self-pay | Admitting: Family Medicine

## 2017-07-09 NOTE — Telephone Encounter (Signed)
[facility] [address]   And Tizanidine needs to be sent into [facility].

## 2017-07-10 ENCOUNTER — Other Ambulatory Visit: Payer: Self-pay | Admitting: Family Medicine

## 2017-07-10 DIAGNOSIS — M48062 Spinal stenosis, lumbar region with neurogenic claudication: Secondary | ICD-10-CM

## 2017-07-10 DIAGNOSIS — S32010K Wedge compression fracture of first lumbar vertebra, subsequent encounter for fracture with nonunion: Secondary | ICD-10-CM

## 2017-07-10 MED ORDER — TIZANIDINE HCL 4 MG PO TABS: 4.0000 mg | ORAL_TABLET | Freq: Three times a day (TID) | ORAL | 1 refills | Status: DC

## 2017-07-10 MED ORDER — HYDROCODONE-ACETAMINOPHEN 7.5-325 MG PO TABS: 1.0000 | ORAL_TABLET | Freq: Three times a day (TID) | ORAL | 0 refills | Status: DC | PRN

## 2017-07-18 ENCOUNTER — Other Ambulatory Visit: Payer: Self-pay | Admitting: Family Medicine

## 2017-07-18 DIAGNOSIS — F064 Anxiety disorder due to known physiological condition: Secondary | ICD-10-CM

## 2017-07-18 MED ORDER — CLONAZEPAM 1 MG PO TABS: ORAL_TABLET | ORAL | 3 refills | Status: DC

## 2017-07-18 NOTE — Telephone Encounter (Signed)
pt needs refill on his clonazepam 1mg    Send to [facility]  Pt's call back is [phone]  Thanks teri

## 2017-08-11 ENCOUNTER — Other Ambulatory Visit: Payer: Self-pay | Admitting: Family Medicine

## 2017-08-11 ENCOUNTER — Telehealth: Payer: Self-pay | Admitting: Family Medicine

## 2017-08-11 DIAGNOSIS — S32010K Wedge compression fracture of first lumbar vertebra, subsequent encounter for fracture with nonunion: Secondary | ICD-10-CM

## 2017-08-11 DIAGNOSIS — M48062 Spinal stenosis, lumbar region with neurogenic claudication: Secondary | ICD-10-CM

## 2017-08-11 MED ORDER — HYDROCODONE-ACETAMINOPHEN 7.5-325 MG PO TABS: 1.0000 | ORAL_TABLET | Freq: Three times a day (TID) | ORAL | 0 refills | Status: DC | PRN

## 2017-08-11 NOTE — Telephone Encounter (Signed)
t needs refill on his hydrocodone 7.5/325  [facility] [address]  Thanks Fortune Brandsteri

## 2017-08-11 NOTE — Telephone Encounter (Signed)
Done. Remind patient of appointment on 09-12-17.

## 2017-08-12 NOTE — Telephone Encounter (Signed)
Patient aware of rx and reminded of appointment.

## 2017-09-12 ENCOUNTER — Ambulatory Visit (INDEPENDENT_AMBULATORY_CARE_PROVIDER_SITE_OTHER): Payer: Managed Care, Other (non HMO) | Admitting: Family Medicine

## 2017-09-12 ENCOUNTER — Encounter: Payer: Self-pay | Admitting: Family Medicine

## 2017-09-12 VITALS — BP 108/72 | HR 74 | Temp 98.3°F | Resp 16 | Wt 203.0 lb

## 2017-09-12 DIAGNOSIS — M47817 Spondylosis without myelopathy or radiculopathy, lumbosacral region: Secondary | ICD-10-CM

## 2017-09-12 DIAGNOSIS — M48062 Spinal stenosis, lumbar region with neurogenic claudication: Secondary | ICD-10-CM | POA: Diagnosis not present

## 2017-09-12 DIAGNOSIS — F410 Panic disorder [episodic paroxysmal anxiety] without agoraphobia: Secondary | ICD-10-CM | POA: Diagnosis not present

## 2017-09-12 DIAGNOSIS — S32010K Wedge compression fracture of first lumbar vertebra, subsequent encounter for fracture with nonunion: Secondary | ICD-10-CM | POA: Diagnosis not present

## 2017-09-12 DIAGNOSIS — Z8639 Personal history of other endocrine, nutritional and metabolic disease: Secondary | ICD-10-CM | POA: Diagnosis not present

## 2017-09-12 MED ORDER — HYDROCODONE-ACETAMINOPHEN 7.5-325 MG PO TABS: 1.0000 | ORAL_TABLET | Freq: Three times a day (TID) | ORAL | 0 refills | Status: DC | PRN

## 2017-09-12 NOTE — Progress Notes (Signed)
Patient: David Reeves Male    DOB: 1971/02/28   47 y.o.   MRN: [id number] Visit Date: 09/12/2017  Today's Provider: Dortha Kern, PA   Chief Complaint  Patient presents with  . Anxiety  . Depression   Subjective:    HPI Patient comes in today for a follow up on Anxiety and Depression. He was last seen in the office 3 months ago. Patient reports that he is having worsening anxiety/panic attacks at night.   Patient is also following up on back pain. He reports that over the last week his symptoms have worsened, giving him a sharp, burning sensation. He reports that this is sometimes relieved with a heating pad and a pillow.     Past Medical History:  Diagnosis Date  . Back pain 2014  . Bone spur   . Bulging disc 2014  . Degenerative disc disease   . Osteoarthritis   . Panic anxiety syndrome   . Taking multiple medications for chronic disease    Patient Active Problem List   Diagnosis Date Noted  . Schizophrenia (HCC) 05/14/2017  . Laceration of right hand without foreign body   . Cannabis use disorder, moderate, dependence (HCC) 05/09/2017  . Closed compression fracture of L1 lumbar vertebra 05/24/2016  . Lumbar stenosis with neurogenic claudication 11/01/2015  . Chronic 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 11/08/2014  . Hx of hemorrhoids 11/08/2014  . AAA (abdominal aortic aneurysm) (HCC) 09/22/2014  . Chronic pain associated with significant psychosocial dysfunction 09/22/2014  . Panic attack 09/22/2014  . AB (asthmatic bronchitis) 08/17/2014  . Anxiety 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 08/17/2014  . Backache 08/17/2014  . Lumbosacral spondylosis without myelopathy 08/17/2014  . 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 Brash 08/17/2014  . Low back pain 08/17/2014  . Tendon nodule 08/17/2014  . Episodic paroxysmal anxiety disorder 08/17/2014  . Hernia, inguinal, right 08/17/2014  . Fast heart beat 08/17/2014  . Illness  08/17/2014  . Inguinal hernia 10/14/2012   Past Surgical History:  Procedure Laterality Date  . HERNIA REPAIR Left 2006,2014   [facility]  . TOE SURGERY Right 2007   Family History  Problem Relation Age of Onset  . Diabetes Mother   . Diabetes Maternal Uncle   . Diabetes Maternal Grandmother   . Heart disease Maternal Grandmother    Allergies  Allergen Reactions  . Duloxetine     Other reaction(s): Other (See Comments) Increased temperature, sweating, uncontrolled shaking, aggressive thoughts  . Amitriptyline Other (See Comments)    Urine retention  . Buspirone Other (See Comments)    Kidney pain  . Ciprofloxacin Other (See Comments)    hallucinations  . Doxepin Other (See Comments)    "Didn't feel right"  . Escitalopram Other (See Comments)    Agitation  . Penicillins Other (See Comments)    Unknown -- childhood reaction  . Sertraline Other (See Comments)    Urine retention  . Trazodone Other (See Comments)    abd pain, urine retention    Current Outpatient Medications:  .  Calcium Carb-Cholecalciferol (CALCIUM+D3) 600-800 MG-UNIT TABS, Take by mouth., Disp: , Rfl:  .  celecoxib (CELEBREX) 200 MG capsule, Take 1 capsule by mouth  twice daily as needed, Disp: 180 capsule, Rfl: 2 .  Cholecalciferol (VITAMIN D3) 2000 units TABS, Take by mouth., Disp: , Rfl:  .  clonazePAM (KLONOPIN) 1 MG tablet, Take 2 tablets in the morning, 1 afternoon and 1 at  bedtime by mouth as needed for anxiety/panic., Disp: 100 tablet, Rfl: 3 .  haloperidol (HALDOL) 5 MG tablet, Take 1 tablet (5 mg total) by mouth 2 (two) times daily as needed for agitation (Take when feeling agitated)., Disp: 60 tablet, Rfl: 0 .  HYDROcodone-acetaminophen (NORCO) 7.5-325 MG tablet, Take 1 tablet by mouth 3 (three) times daily as needed., Disp: 90 tablet, Rfl: 0 .  hydrOXYzine (ATARAX/VISTARIL) 50 MG tablet, Take 1 tablet (50 mg total) by mouth 3 (three) times daily as needed for anxiety., Disp: 60 tablet, Rfl: 0 .   Multiple Vitamin (MULTIVITAMIN) tablet, Take 1 tablet by mouth daily., Disp: , Rfl:  .  OLANZapine (ZYPREXA) 5 MG tablet, Take 1-4 tablets (5-20 mg total) by mouth See admin instructions. Take 1 tablet (5 mg) in the morning and 4 tablets (20 mg) at bedtime, Disp: 150 tablet, Rfl: 0 .  Probiotic Product (PROBIOTIC COLON SUPPORT PO), Take by mouth., Disp: , Rfl:  .  tiZANidine (ZANAFLEX) 4 MG tablet, Take 1 tablet (4 mg total) by mouth 3 (three) times daily., Disp: 270 tablet, Rfl: 1 .  Glucosamine-Chondroit-Vit C-Mn (GLUCOSAMINE CHONDR 1500 COMPLX PO), Take by mouth., Disp: , Rfl:   Review of Systems  Constitutional: Negative.   HENT: Negative.   Eyes: Negative.   Respiratory: Negative.   Cardiovascular: Negative.   Gastrointestinal: Negative.   Genitourinary: Negative.   Musculoskeletal: Positive for back pain and myalgias.  Psychiatric/Behavioral: Positive for sleep disturbance. Negative for hallucinations. The patient is nervous/anxious.        Wake up with anxiety/panic 10 days a month. Postpone use of medications until scheduled time. Rarely uses the Haldol prescribed by the psychiatrist.   Social History   Tobacco Use  . Smoking status: Never Smoker  . Smokeless tobacco: Never Used  Substance Use Topics  . Alcohol use: No   Objective:   BP 108/72 (BP Location: Right Arm, Patient Position: Sitting, Cuff Size: Normal)   Pulse 74   Temp 98.3 F (36.8 C)   Resp 16   Wt 203 lb (92.1 kg)   SpO2 98%   BMI 29.98 kg/m  Vitals:   09/12/17 0811  BP: 108/72  Pulse: 74  Resp: 16  Temp: 98.3 F (36.8 C)  SpO2: 98%  Weight: 203 lb (92.1 kg)   Physical Exam  Constitutional: He is oriented to person, place, and time. He appears well-developed and well-nourished. No distress.  HENT:  Head: Normocephalic and atraumatic.  Right Ear: Hearing normal.  Left Ear: Hearing normal.  Nose: Nose normal.  Eyes: Pupils are equal, round, and reactive to light. Conjunctivae and lids are  normal. Right eye exhibits no discharge. Left eye exhibits no discharge. No scleral icterus.  Neck: Normal range of motion. Neck supple. No tracheal deviation present.  Cardiovascular: Normal rate and regular rhythm.  Pulmonary/Chest: Effort normal and breath sounds normal. No respiratory distress.  Abdominal: Bowel sounds are normal.  Musculoskeletal: He exhibits tenderness.  Tender lower back bilaterally. Improvement of mottling of skin with lower temperature on heating pad. No weak of legs and less radiation into legs with use of CBD. SLR's cause pains bilaterally at 90 degrees. Good balance. Slow gait due to back pain.  Lymphadenopathy:    He has no cervical adenopathy.  Neurological: He is alert and oriented to person, place, and time.  Skin: Skin is intact. No lesion and no rash noted.  Psychiatric: His speech is normal. Thought content normal. His mood appears anxious. He is agitated.  He expresses no suicidal plans and no homicidal plans.      Assessment & Plan:      1. Panic attack Having less panic attacks. Continues to use the Clonazepam 1 mg 2 tablets each morning, 2 in the afternoon and 1 at bedtime prn. Was treated with Zyprexa, Hydroxyzine and Haldol in the hospital 05-08-17. Felt "drugged" with this regimen and decided to stop the Zyprexa and Hydroxyzine. Feels the Haldol works if he only takes one at bedtime - occasionally. Has not kept follow up appointment with Dr. Johnella Moloney at [facility]. Encouraged to re-establish care/therapy there. Recheck CBC, CMP and TSH for metabolic changes. Denies any further problems with paranoia or significant aggressive behavior. States he is "working things out" with his wife now. - CBC with Differential/Platelet - Comprehensive metabolic panel - TSH  2. Lumbar stenosis with neurogenic claudication Chronic pain in the lower back bilaterally is iprove d with the Celebrex, Zanaflex and Norco regimen. Not wearing his back brace 24 hrs a  day now. Has been able to lower temperature on his heating pad with fair pain relief. Uses Norco on a regular TID basis without additional dosing. Walks his dog 1-2 times a day to throw a ball for it to fetch. Even take short (<10 mile) rides on his motorcycle on sunny days. States the Zanaflex and moist heat calms back spasms and allows him to postpone Norco to his regular times. Refilled Norco on schedule. - CBC with Differential/Platelet - HYDROcodone-acetaminophen (NORCO) 7.5-325 MG tablet; Take 1 tablet by mouth 3 (three) times daily as needed.  Dispense: 90 tablet; Refill: 0  3. Lumbosacral spondylosis without myelopathy Pain stable at 4-5/10 and no radiation into legs today. Still can't bend over to lift anything significant and must change positions frequently to maintain control of pain. Recheck pending lab reports. - CBC with Differential/Platelet  4. History of hypokalemia Potassium was down to 3.4 at the hospitalization in January 2019. Will recheck CMP to assess kidney function and electrolytes. Encouraged to drink fluids/water and get fresh fruits/vegetables in diet. - Comprehensive metabolic panel  5. 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 Spasms in lower back to flex or transfer from sitting to standing. Muscle relaxant and analgesics with NSAID continues to control all back pains. Refilled medication and getting follow up labs. - HYDROcodone-acetaminophen (NORCO) 7.5-325 MG tablet; Take 1 tablet by mouth 3 (three) times daily as needed.  Dispense: 90 tablet; Refill: 0       Dortha Kern, PA  [facility]

## 2017-09-19 LAB — CBC WITH DIFFERENTIAL/PLATELET
Basophils Absolute: 0 10*3/uL (ref 0.0–0.2)
Basos: 0 %
EOS (ABSOLUTE): 0.1 10*3/uL (ref 0.0–0.4)
Eos: 1 %
Hematocrit: 44.2 % (ref 37.5–51.0)
Hemoglobin: 15.3 g/dL (ref 13.0–17.7)
Immature Grans (Abs): 0 10*3/uL (ref 0.0–0.1)
Immature Granulocytes: 0 %
Lymphocytes Absolute: 1.6 10*3/uL (ref 0.7–3.1)
Lymphs: 31 %
MCH: 33.5 pg — ABNORMAL HIGH (ref 26.6–33.0)
MCHC: 34.6 g/dL (ref 31.5–35.7)
MCV: 97 fL (ref 79–97)
Monocytes Absolute: 0.5 10*3/uL (ref 0.1–0.9)
Monocytes: 9 %
Neutrophils Absolute: 3.1 10*3/uL (ref 1.4–7.0)
Neutrophils: 59 %
Platelets: 258 10*3/uL (ref 150–450)
RBC: 4.57 x10E6/uL (ref 4.14–5.80)
RDW: 13.6 % (ref 12.3–15.4)
WBC: 5.3 10*3/uL (ref 3.4–10.8)

## 2017-09-19 LAB — COMPREHENSIVE METABOLIC PANEL
ALT: 15 IU/L (ref 0–44)
AST: 16 IU/L (ref 0–40)
Albumin/Globulin Ratio: 2 (ref 1.2–2.2)
Albumin: 4.5 g/dL (ref 3.5–5.5)
Alkaline Phosphatase: 61 IU/L (ref 39–117)
BUN/Creatinine Ratio: 10 (ref 9–20)
BUN: 9 mg/dL (ref 6–24)
Bilirubin Total: 0.5 mg/dL (ref 0.0–1.2)
CO2: 23 mmol/L (ref 20–29)
Calcium: 9.4 mg/dL (ref 8.7–10.2)
Chloride: 102 mmol/L (ref 96–106)
Creatinine, Ser: 0.94 mg/dL (ref 0.76–1.27)
GFR calc Af Amer: 111 mL/min/{1.73_m2} (ref >59)
GFR calc non Af Amer: 96 mL/min/{1.73_m2} (ref >59)
Globulin, Total: 2.3 g/dL (ref 1.5–4.5)
Glucose: 115 mg/dL — ABNORMAL HIGH (ref 65–99)
Potassium: 4.5 mmol/L (ref 3.5–5.2)
Sodium: 141 mmol/L (ref 134–144)
Total Protein: 6.8 g/dL (ref 6.0–8.5)

## 2017-09-19 LAB — TSH: TSH: 1.22 u[IU]/mL (ref 0.450–4.500)

## 2017-10-13 ENCOUNTER — Other Ambulatory Visit: Payer: Self-pay | Admitting: Family Medicine

## 2017-10-13 DIAGNOSIS — S32010K Wedge compression fracture of first lumbar vertebra, subsequent encounter for fracture with nonunion: Secondary | ICD-10-CM

## 2017-10-13 DIAGNOSIS — M48062 Spinal stenosis, lumbar region with neurogenic claudication: Secondary | ICD-10-CM

## 2017-10-13 MED ORDER — HYDROCODONE-ACETAMINOPHEN 7.5-325 MG PO TABS: 1.0000 | ORAL_TABLET | Freq: Three times a day (TID) | ORAL | 0 refills | Status: DC | PRN

## 2017-10-13 NOTE — Telephone Encounter (Signed)
Pt requesting [facility]ill of hydro[facility] 7.5-325 [facility] [address]

## 2017-10-28 ENCOUNTER — Other Ambulatory Visit: Payer: Self-pay | Admitting: Family Medicine

## 2017-10-28 DIAGNOSIS — F064 Anxiety disorder due to known physiological condition: Secondary | ICD-10-CM

## 2017-10-28 MED ORDER — CELECOXIB 200 MG PO CAPS: ORAL_CAPSULE | ORAL | 2 refills | Status: DC

## 2017-10-28 MED ORDER — CLONAZEPAM 1 MG PO TABS: ORAL_TABLET | ORAL | 3 refills | Status: DC

## 2017-10-28 NOTE — Telephone Encounter (Signed)
Pt needs refill on his   clonazepam, 1 mg  Celecoxib 200 mg  [facility]  Thanks Fortune Brandsteri

## 2017-11-11 ENCOUNTER — Telehealth: Payer: Self-pay

## 2017-11-11 DIAGNOSIS — M48062 Spinal stenosis, lumbar region with neurogenic claudication: Secondary | ICD-10-CM

## 2017-11-11 DIAGNOSIS — S32010K Wedge compression fracture of first lumbar vertebra, subsequent encounter for fracture with nonunion: Secondary | ICD-10-CM

## 2017-11-11 MED ORDER — HYDROCODONE-ACETAMINOPHEN 7.5-325 MG PO TABS: 1.0000 | ORAL_TABLET | Freq: Three times a day (TID) | ORAL | 0 refills | Status: DC | PRN

## 2017-11-11 NOTE — Telephone Encounter (Signed)
David Reeves patient:: Patient is requesting a refill on [facility]cod[facility] Last RF was on 10/13/17. Last OV 09/12/17.

## 2017-11-11 NOTE — Telephone Encounter (Signed)
Refilled

## 2017-11-24 ENCOUNTER — Telehealth: Payer: Self-pay | Admitting: Family Medicine

## 2017-11-26 ENCOUNTER — Encounter: Payer: Self-pay | Admitting: Family Medicine

## 2017-11-26 ENCOUNTER — Ambulatory Visit (INDEPENDENT_AMBULATORY_CARE_PROVIDER_SITE_OTHER): Payer: Managed Care, Other (non HMO) | Admitting: Family Medicine

## 2017-11-26 VITALS — BP 120/79 | HR 79 | Temp 98.2°F | Resp 16 | Wt 198.0 lb

## 2017-11-26 DIAGNOSIS — F410 Panic disorder [episodic paroxysmal anxiety] without agoraphobia: Secondary | ICD-10-CM

## 2017-11-26 DIAGNOSIS — F200 Paranoid schizophrenia: Secondary | ICD-10-CM

## 2017-11-26 NOTE — Progress Notes (Signed)
Patient: David Reeves Male    DOB: 12-25-1970   47 y.o.   MRN: [id number] Visit Date: 11/26/2017  Today's Provider: Mila Merryonald Conya Ellinwood, MD   Chief Complaint  Patient presents with  . Follow-up   Subjective:    HPI  Panic attack From 09/12/2017-see by David Reeves. Encouraged to re-establish care with psychiatrist. Labs checked-showing normal. Per David Reeves patient advised to follow up in 3 months. He has appointment scheduled with David Reeves next week, but states he got an e-mail from [facility] stating his clonazepam refill was cancelled and he will be out of medication tomorrow. [facility] [address]     Psychosis: Patient was involuntarily hospitalized in January for psychosis and started on olanazapine and haloperidol. He did not follow up with psychiatry as recommended. He now states that episode was due to uncontrolled anxiety, excessive vaping and marijuana use which he has since stopped. He vehemently denies any hallucinations at this time and refuses to return to psychiatry.   Allergies  Allergen Reactions  . Duloxetine     Other reaction(s): Other (See Comments) Increased temperature, sweating, uncontrolled shaking, aggressive thoughts  . Amitriptyline Other (See Comments)    Urine retention  . Buspirone Other (See Comments)    Kidney pain  . Ciprofloxacin Other (See Comments)    hallucinations  . Doxepin Other (See Comments)    "Didn't feel right"  . Escitalopram Other (See Comments)    Agitation  . Penicillins Other (See Comments)    Unknown -- childhood reaction  . Sertraline Other (See Comments)    Urine retention  . Trazodone Other (See Comments)    abd pain, urine retention     Current Outpatient Medications:  .  Ascorbic Acid (VITAMIN C PO), Take by mouth daily., Disp: , Rfl:  .  celecoxib (CELEBREX) 200 MG capsule, Take 1 capsule by mouth  twice daily as needed, Disp: 180 capsule, Rfl: 2 .   Cholecalciferol (VITAMIN D3) 2000 units TABS, Take by mouth., Disp: , Rfl:  .  clonazePAM (KLONOPIN) 1 MG tablet, Take 2 tablets in the morning, 1 afternoon and 1 at bedtime by mouth as needed for anxiety/panic., Disp: 100 tablet, Rfl: 3 .  diphenhydrAMINE HCl, Sleep, (UNISOM SLEEPGELS) 50 MG CAPS, Take 50 mg by mouth as needed., Disp: , Rfl:  .  Glucosamine-Chondroit-Vit C-Mn (GLUCOSAMINE CHONDR 1500 COMPLX PO), Take by mouth., Disp: , Rfl:  .  HYDROcodone-acetaminophen (NORCO) 7.5-325 MG tablet, Take 1 tablet by mouth 3 (three) times daily as needed., Disp: 90 tablet, Rfl: 0 .  Melatonin 5 MG CHEW, Chew by mouth as needed., Disp: , Rfl:  .  Multiple Vitamin (MULTIVITAMIN) tablet, Take 1 tablet by mouth daily., Disp: , Rfl:  .  Probiotic Product (PROBIOTIC COLON SUPPORT PO), Take by mouth., Disp: , Rfl:  .  tiZANidine (ZANAFLEX) 4 MG tablet, Take 1 tablet (4 mg total) by mouth 3 (three) times daily., Disp: 270 tablet, Rfl: 1 .  Calcium Carb-Cholecalciferol (CALCIUM+D3) 600-800 MG-UNIT TABS, Take by mouth., Disp: , Rfl:  .  haloperidol (HALDOL) 5 MG tablet, Take 1 tablet (5 mg total) by mouth 2 (two) times daily as needed for agitation (Take when feeling agitated). (Patient not taking: Reported on 11/26/2017), Disp: 60 tablet, Rfl: 0 .  OLANZapine (ZYPREXA) 5 MG tablet, Take 1-4 tablets (5-20 mg total) by mouth See admin instructions. Take 1 tablet (5 mg) in the morning and 4 tablets (20  mg) at bedtime (Patient not taking: Reported on 11/26/2017), Disp: 150 tablet, Rfl: 0  Review of Systems  Constitutional: Negative for appetite change, chills and fever.  Respiratory: Negative for chest tightness, shortness of breath and wheezing.   Cardiovascular: Negative for chest pain and palpitations.  Gastrointestinal: Negative for abdominal pain, nausea and vomiting.    Social History   Tobacco Use  . Smoking status: Never Smoker  . Smokeless tobacco: Never Used  Substance Use Topics  . Alcohol use: No     Objective:   BP 120/79 (BP Location: Right Arm, Patient Position: Sitting, Cuff Size: Large)   Pulse 79   Temp 98.2 F (36.8 C) (Oral)   Resp 16   Wt 198 lb (89.8 kg)   SpO2 97%   BMI 29.24 kg/m  Vitals:   11/26/17 0819  BP: 120/79  Pulse: 79  Resp: 16  Temp: 98.2 F (36.8 C)  TempSrc: Oral  SpO2: 97%  Weight: 198 lb (89.8 kg)     Physical Exam   General Appearance:    Alert, cooperative, no distress  Eyes:    PERRL, conjunctiva/corneas clear, EOM's intact       Lungs:     Clear to auscultation bilaterally, respirations unlabored  Heart:    Regular rate and rhythm  Neurologic:   Awake, alert, oriented x 3. No apparent focal neurological           defect.           Assessment & Plan:     1. Episodic paroxysmal anxiety disorder Contacted [facility] and prescription was shipped today. However, he only has 12 tablets left which will run out before the weekend. If prescription does not arrive by Friday 11-28-16 will need to send prescription to [facility].   2. Paranoid schizophrenia (HCC) He has not followed up with psychiatry as previously advised. His thought process is clear and he is lucid now. He attributes episode in January to external factors and refused referral back to psychiatry. Will have him follow up with David Reeves next month       Mila Merry, MD  [facility]

## 2017-11-28 ENCOUNTER — Emergency Department: Payer: Managed Care, Other (non HMO)

## 2017-11-28 ENCOUNTER — Encounter: Payer: Self-pay | Admitting: Emergency Medicine

## 2017-11-28 ENCOUNTER — Other Ambulatory Visit: Payer: Self-pay

## 2017-11-28 ENCOUNTER — Emergency Department
Admission: EM | Admit: 2017-11-28 | Discharge: 2017-11-28 | Payer: Managed Care, Other (non HMO) | Attending: Emergency Medicine | Admitting: Emergency Medicine

## 2017-11-28 DIAGNOSIS — Z008 Encounter for other general examination: Secondary | ICD-10-CM

## 2017-11-28 DIAGNOSIS — Z0289 Encounter for other administrative examinations: Secondary | ICD-10-CM | POA: Diagnosis not present

## 2017-11-28 DIAGNOSIS — Y9389 Activity, other specified: Secondary | ICD-10-CM | POA: Insufficient documentation

## 2017-11-28 DIAGNOSIS — M5489 Other dorsalgia: Secondary | ICD-10-CM | POA: Diagnosis present

## 2017-11-28 DIAGNOSIS — S29012A Strain of muscle and tendon of back wall of thorax, initial encounter: Secondary | ICD-10-CM | POA: Diagnosis not present

## 2017-11-28 DIAGNOSIS — Z79899 Other long term (current) drug therapy: Secondary | ICD-10-CM | POA: Diagnosis not present

## 2017-11-28 DIAGNOSIS — W500XXA Accidental hit or strike by another person, initial encounter: Secondary | ICD-10-CM | POA: Insufficient documentation

## 2017-11-28 DIAGNOSIS — Y9289 Other specified places as the place of occurrence of the external cause: Secondary | ICD-10-CM | POA: Diagnosis not present

## 2017-11-28 DIAGNOSIS — Y999 Unspecified external cause status: Secondary | ICD-10-CM | POA: Insufficient documentation

## 2017-11-28 MED ORDER — KETOROLAC TROMETHAMINE 30 MG/ML IJ SOLN
30.0000 mg | Freq: Once | INTRAMUSCULAR | Status: AC
  Administered 2017-11-28: 30 mg via INTRAMUSCULAR
  Filled 2017-11-28: qty 1

## 2017-11-28 MED ORDER — ORPHENADRINE CITRATE 30 MG/ML IJ SOLN
60.0000 mg | Freq: Once | INTRAMUSCULAR | Status: AC
  Administered 2017-11-28: 60 mg via INTRAMUSCULAR
  Filled 2017-11-28: qty 2
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
## 2017-11-28 NOTE — ED Notes (Signed)
Pt to Xray.

## 2017-11-28 NOTE — ED Triage Notes (Signed)
[place] to ER [place] [place] [employer] [place] with complaints of back pain. Pt reports when he was rolled to right side he felt a "pop". Pt talks in complete sentences.
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
## 2017-11-28 NOTE — ED Notes (Signed)
At 2305, this RN drew two long gray top tubes from the pt for [employer]. Pt was compliant. Pt was then given pain medications with no issue.

## 2017-11-28 NOTE — ED Notes (Signed)
Pt did not give permission to RN to do blood work.
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
## 2017-12-02 ENCOUNTER — Ambulatory Visit: Payer: Self-pay | Admitting: Family Medicine

## 2017-12-09 ENCOUNTER — Other Ambulatory Visit: Payer: Self-pay | Admitting: Family Medicine

## 2017-12-09 DIAGNOSIS — S32010K Wedge compression fracture of first lumbar vertebra, subsequent encounter for fracture with nonunion: Secondary | ICD-10-CM

## 2017-12-09 DIAGNOSIS — M48062 Spinal stenosis, lumbar region with neurogenic claudication: Secondary | ICD-10-CM

## 2017-12-09 MED ORDER — HYDROCODONE-ACETAMINOPHEN 7.5-325 MG PO TABS: 1.0000 | ORAL_TABLET | Freq: Three times a day (TID) | ORAL | 0 refills | Status: DC | PRN

## 2017-12-09 NOTE — Telephone Encounter (Signed)
Patient is requesting refill on Hydrocodone-Acetaminophen 7.5-325 mg to [facility] [address]

## 2017-12-12 ENCOUNTER — Telehealth: Payer: Self-pay | Admitting: Family Medicine

## 2017-12-12 DIAGNOSIS — F410 Panic disorder [episodic paroxysmal anxiety] without agoraphobia: Secondary | ICD-10-CM

## 2017-12-12 NOTE — Telephone Encounter (Signed)
Pt stated that he saw Dr. Sherrie MustacheFisher for OV on 11/26/17 for anxiety and is requesting a referral so [name]. Please advise. Thanks TNP

## 2017-12-12 NOTE — Telephone Encounter (Signed)
Order entered

## 2017-12-12 NOTE — Telephone Encounter (Signed)
Please advise 

## 2017-12-17 ENCOUNTER — Other Ambulatory Visit: Payer: Self-pay | Admitting: Family Medicine

## 2017-12-17 DIAGNOSIS — F064 Anxiety disorder due to known physiological condition: Secondary | ICD-10-CM

## 2017-12-17 NOTE — Telephone Encounter (Signed)
Pt contacted office for refill request on the following medications:  clonazePAM (KLONOPIN) 1 MG tablet   [facility] [address]  Pt stated he has enough medication to last until Sunday. Please advise. Thanks TNP

## 2017-12-18 NOTE — Telephone Encounter (Signed)
Spoke with Mr. David Reeves.  He states he is no longer uses [facility] so he is not able to get the refills that was prescribed.  He states he is not using more than what is prescribed.    Thanks,   -Vernona RiegerLaura

## 2017-12-18 NOTE — Telephone Encounter (Signed)
Refilled this on 10-28-17 with refills through [facility]. Chart indicates he has a follow up appointment with behavioral health on 12-31-17. Why is he running out of this medicine? Is he using it more frequently than prescribed?

## 2017-12-19 MED ORDER — CLONAZEPAM 1 MG PO TABS: ORAL_TABLET | ORAL | 0 refills | Status: DC

## 2017-12-19 NOTE — Telephone Encounter (Signed)
Will give a refill to cover until seen by psychiatrist on 12-31-17.

## 2017-12-31 ENCOUNTER — Encounter: Payer: Self-pay | Admitting: Psychiatry

## 2017-12-31 ENCOUNTER — Ambulatory Visit: Payer: 59 | Admitting: Psychiatry

## 2017-12-31 VITALS — BP 138/97 | HR 90 | Temp 98.3°F | Wt 201.0 lb

## 2017-12-31 DIAGNOSIS — F4010 Social phobia, unspecified: Secondary | ICD-10-CM

## 2017-12-31 DIAGNOSIS — F410 Panic disorder [episodic paroxysmal anxiety] without agoraphobia: Secondary | ICD-10-CM

## 2017-12-31 DIAGNOSIS — F200 Paranoid schizophrenia: Secondary | ICD-10-CM | POA: Diagnosis not present

## 2017-12-31 DIAGNOSIS — F1320 Sedative, hypnotic or anxiolytic dependence, uncomplicated: Secondary | ICD-10-CM

## 2017-12-31 DIAGNOSIS — F411 Generalized anxiety disorder: Secondary | ICD-10-CM | POA: Diagnosis not present

## 2017-12-31 DIAGNOSIS — F1221 Cannabis dependence, in remission: Secondary | ICD-10-CM | POA: Diagnosis not present

## 2017-12-31 MED ORDER — QUETIAPINE FUMARATE 50 MG PO TABS: 50.0000 mg | ORAL_TABLET | Freq: Every day | ORAL | 0 refills | Status: DC

## 2017-12-31 MED ORDER — QUETIAPINE FUMARATE 25 MG PO TABS: 12.5000 mg | ORAL_TABLET | Freq: Every day | ORAL | 0 refills | Status: DC

## 2017-12-31 NOTE — Progress Notes (Signed)
Psychiatric Initial Adult Assessment   Patient Identification: David Reeves MRN:  [id number] Date of Evaluation:  12/31/2017 Referral Source: Mila Merry Chief Complaint:  ' I am here to establish care. Chief Complaint    Establish Care; Anxiety; Paranoid; Schizophrenia; Panic Attack; Insomnia     Visit Diagnosis:    ICD-10-CM   1. Paranoid schizophrenia (HCC) F20.0 QUEtiapine (SEROQUEL) 50 MG tablet    QUEtiapine (SEROQUEL) 25 MG tablet  2. Panic disorder F41.0 QUEtiapine (SEROQUEL) 50 MG tablet    QUEtiapine (SEROQUEL) 25 MG tablet  3. Social anxiety disorder F40.10 QUEtiapine (SEROQUEL) 50 MG tablet    QUEtiapine (SEROQUEL) 25 MG tablet  4. Cannabis use disorder, severe, in early remission (HCC) F12.21   5. Benzodiazepine dependence (HCC) F13.20     History of Present Illness:  David Reeves is a 47 year old Caucasian male, married, on disability, lives in [place], has a history of schizophrenia, anxiety disorder, panic attacks, cannabis use disorder currently in early remission, opioid and benzodiazepine dependence-prescribed, presented to the clinic today to establish care.  Patient reports he decided to get established at the clinic since he stopped smoking cannabis couple of months ago and decided to get back on medications. Patient was admitted to [facility] behavioral health unit under IVC on 05/09/2017 to 05/23/2017.  At that time patient appeared to be psychotic and was delusional that his house was broken into, he was being poisoned, his neighbor raped his daughter, he was being tracked and so on.  Since UDS was positive for opiates, THC and benzos at that time.  At that time he was managed with Haldol as well as olanzapine.  Patient's psychosis resolved and he was discharged on antipsychotic medication as well as his Klonopin .  Patient reports that ever since he got discharged from the inpatient unit he stopped taking the olanzapine.  He reports he had adverse reaction to  olanzapine and he did not feel right on it.  He reports he like the effect of Haldol but his primary care provider refused to prescribe it for him.  Patient reports he continues to struggle with some paranoia however he is not sure if they are true paranoia or not.  Patient reports it usually feels like a feeling of impending doom when his Klonopin is getting out of the system.  He  takes Klonopin around the clock.  He reports he takes the Klonopin for the  spasms of his back since he has severe spinal stenosis as well as pain.  Patient reports his providers told him there is nothing left to do for his back.  He manages his pain and spasms by taking Klonopin.  He reports he started getting panic attacks after he started having his back problems in 2014.  He reports he never had any panic attacks prior to that.  He reports his panic symptoms as significant pressure on his chest, racing heart rate and anxiety symptoms that last usually for several hours.  He reports he copes by taking Klonopin which is currently being to some extent.  He reports he also used a lot of cannabis which helped his symptoms.  He reports there are articles and studies that shows that cannabis helps with panic attacks as well as paranoia.  Patient however reports he tried to wean himself off of it because he may have used some cannabis which may have been laced with other substances which caused him to be psychotic and got him admitted to the inpatient unit.  Patient  does not want that to happen again and decided to get off of it.  Patient reports significant social anxiety symptoms.  He reports he does not like to be around people.  Patient denies any history of trauma.  Patient denies any manic or hypomanic symptoms.    Associated Signs/Symptoms: Depression Symptoms:  insomnia, psychomotor agitation, fatigue, difficulty concentrating, (Hypo) Manic Symptoms:  Delusions, Anxiety Symptoms:  Excessive Worry, Panic  Symptoms, Social Anxiety, Psychotic Symptoms:  Paranoia, PTSD Symptoms: Negative  Past Psychiatric History: Pt was admitted to inpatient behavioral health unit at [facility] from 05/09/2017 - 05/23/2017.  Pt at that time was diagnosed with schizophrenia and discharged on Haldol and Zyprexa.  Previous Psychotropic Medications: Yes Haldol, Zyprexa, risperidone, Cymbalta, amitriptyline, BuSpar, doxepin, Lexapro, Zoloft, trazodone.  Substance Abuse History in the last 12 months:  Yes.  Currently is on opioids as well as benzodiazepine prescribed.  Patient reports using Klonopin around the clock however does not elaborate on whether he ever uses more than what was prescribed.  Patient reports he used to smoke cannabis, used to vape it .  Patient reports he stopped using cannabis 2 months ago.  Consequences of Substance Abuse: Medical Consequences:  RECENT ADMISSION TO IP UNIT Legal Consequences:   DWI , PENDING COURT HEARING  Past Medical History:  Past Medical History:  Diagnosis Date  . Back pain 2014  . Bone spur   . Bulging disc 2014  . Degenerative disc disease   . Osteoarthritis   . Panic anxiety syndrome   . Taking multiple medications for chronic disease     Past Surgical History:  Procedure Laterality Date  . CYSTECTOMY     head  . HERNIA REPAIR Left 2006,2014   [facility]  . TOE SURGERY Right 2007    Family Psychiatric History: Mother-possible mental illness, maternal grandfather-alcoholism.  Family History:  Family History  Problem Relation Age of Onset  . Diabetes Mother   . Diabetes Maternal Uncle   . Diabetes Maternal Grandmother   . Heart disease Maternal Grandmother     Social History:   Social History   Socioeconomic History  . Marital status: Married    Spouse name: mikki   . Number of children: 1  . Years of education: Not on file  . Highest education level: Some college, no degree  Occupational History  . Not on file  Social Needs  . Financial resource  strain: Not hard at all  . Food insecurity:    Worry: Never true    Inability: Never true  . Transportation needs:    Medical: No    Non-medical: No  Tobacco Use  . Smoking status: Never Smoker  . Smokeless tobacco: Never Used  . Tobacco comment: cbd    Substance and Sexual Activity  . Alcohol use: Not Currently    Comment: last used 12-22-17  . Drug use: Not Currently    Types: Marijuana    Comment: October 14, 2017  . Sexual activity: Not Currently  Lifestyle  . Physical activity:    Days per week: 3 days    Minutes per session: 30 min  . Stress: Very much  Relationships  . Social connections:    Talks on phone: Not on file    Gets together: Not on file    Attends religious service: Never    Active member of club or organization: No    Attends meetings of clubs or organizations: Never    Relationship status: Married  Other Topics Concern  .  Not on file  Social History Narrative  . Not on file    Additional Social History: Pt is married.  He is on disability.  He lives in [place] with his wife.  He has 1 daughter Arlyss Repress , 69 years old.  Patient reports history of DWI, pending court hearing.  Patient also reports a history of being in jail for 58 days for marijuana charges in the past  Allergies:   Allergies  Allergen Reactions  . Duloxetine     Other reaction(s): Other (See Comments) Increased temperature, sweating, uncontrolled shaking, aggressive thoughts  . Amitriptyline Other (See Comments)    Urine retention  . Buspirone Other (See Comments)    Kidney pain  . Ciprofloxacin Other (See Comments)    hallucinations  . Doxepin Other (See Comments)    "Didn't feel right"  . Escitalopram Other (See Comments)    Agitation  . Olanzapine   . Penicillins Other (See Comments)    Unknown -- childhood reaction  . Sertraline Other (See Comments)    Urine retention  . Trazodone Other (See Comments)    abd pain, urine retention    Metabolic Disorder Labs: Lab  Results  Component Value Date   HGBA1C 4.8 05/10/2017   MPG 91.06 05/10/2017   No results found for: PROLACTIN Lab Results  Component Value Date   CHOL 162 05/10/2017   TRIG 70 05/10/2017   HDL 38 (L) 05/10/2017   CHOLHDL 4.3 05/10/2017   VLDL 14 05/10/2017   LDLCALC 110 (H) 05/10/2017   LDLCALC 112 (H) 11/13/2015     Current Medications: Current Outpatient Medications  Medication Sig Dispense Refill  . Ascorbic Acid (VITAMIN C PO) Take by mouth daily.    . Calcium Carb-Cholecalciferol (CALCIUM+D3) 600-800 MG-UNIT TABS Take by mouth.    . celecoxib (CELEBREX) 200 MG capsule Take 1 capsule by mouth  twice daily as needed 180 capsule 2  . Cholecalciferol (VITAMIN D3) 2000 units TABS Take by mouth.    . clonazePAM (KLONOPIN) 1 MG tablet Take 2 tablets in the morning, 1 afternoon and 1 at bedtime by mouth as needed for anxiety/panic. 100 tablet 0  . diphenhydrAMINE HCl, Sleep, (UNISOM SLEEPGELS) 50 MG CAPS Take 50 mg by mouth as needed.    . Glucosamine-Chondroit-Vit C-Mn (GLUCOSAMINE CHONDR 1500 COMPLX PO) Take by mouth.    Marland Kitchen HYDROcodone-acetaminophen (NORCO) 7.5-325 MG tablet Take 1 tablet by mouth 3 (three) times daily as needed. 90 tablet 0  . Melatonin 5 MG CHEW Chew by mouth as needed.    . Multiple Vitamin (MULTIVITAMIN) tablet Take 1 tablet by mouth daily.    . Probiotic Product (PROBIOTIC COLON SUPPORT PO) Take by mouth.    Marland Kitchen tiZANidine (ZANAFLEX) 4 MG tablet Take 1 tablet (4 mg total) by mouth 3 (three) times daily. 270 tablet 1  . UNABLE TO FIND cbd    . QUEtiapine (SEROQUEL) 25 MG tablet Take 0.5-1 tablets (12.5-25 mg total) by mouth at bedtime. For severe anxiety/agitation 30 tablet 0  . QUEtiapine (SEROQUEL) 50 MG tablet Take 1 tablet (50 mg total) by mouth at bedtime. For mood as well as sleep 30 tablet 0   No current facility-administered medications for this visit.     Neurologic: Headache: No Seizure: No Paresthesias:No  Musculoskeletal: Strength & Muscle  Tone: within normal limits Gait & Station: normal Patient leans: N/A  Psychiatric Specialty Exam: Review of Systems  Psychiatric/Behavioral: Positive for substance abuse. The patient is nervous/anxious and has insomnia.  All other systems reviewed and are negative.   Blood pressure (!) 138/97, pulse 90, temperature 98.3 F (36.8 C), temperature source Oral, weight 201 lb (91.2 kg).Body mass index is 26.52 kg/m.  General Appearance: Casual  Eye Contact:  Fair  Speech:  Clear and Coherent  Volume:  Normal  Mood:  Anxious  Affect:  Congruent  Thought Process:  Goal Directed and Descriptions of Associations: Intact  Orientation:  Full (Time, Place, and Person)  Thought Content:  Logical  Suicidal Thoughts:  No  Homicidal Thoughts:  No  Memory:  Immediate;   Fair Recent;   Fair Remote;   Fair  Judgement:  Fair  Insight:  Fair  Psychomotor Activity:  Normal  Concentration:  Concentration: Fair and Attention Span: Fair  Recall:  [employer] of Knowledge:Fair  Language: Fair  Akathisia:  No  Handed:  Right  AIMS (if indicated):  na  Assets:  Communication Skills Desire for Improvement Social Support  ADL's:  Intact  Cognition: WNL  Sleep:  poor    Treatment Plan Summary:David Reeves is a 47 year old Caucasian male, has a history of schizophrenia, panic disorder, social anxiety, cannabis use disorder, opioid and benzodiazepine dependence-prescribed, chronic back pain, presented to the clinic today to establish care.  Patient is biologically predisposed given his chronic pain as well as substance abuse problems.  Patient also has a family history of mental illness.  Patient denies any suicidality or homicidality.  Patient reports he is motivated to start psychotherapy as well as medications.  Patient also has good social support from his wife and believes his daughter is a positive factor in his life.  Plan as noted below. Medication management and Plan as noted below   Plan For  schizophrenia Start Seroquel 50 mg at bedtime and 12.5-25 mg as needed during the day.  For panic disorder/anxiety disorder Discussed with patient about the risk of being on benzodiazepine therapy long-term. Patient however reports he also takes the Klonopin for his significant back problems and spinal stenosis.  He struggles with spasms of his back and Klonopin helps with the same. Discussed with patient that there are other medications available which can treat his anxiety symptoms however he has had adverse reaction to other medications like SSRI/SNRI in the past.  Patient is interested in genesight testing today and will refer him for the same.  Will discuss medication trials during future visits. Discussed with patient about intensive outpatient program as well as intensive individual psychotherapy visits.  Patient reports interested in the same.  Hence will refer him to Ms. Adelina Mings our therapist here in clinic.  For insomnia Seroquel 50 mg p.o. Nightly  Cannabis use disorder severe  in early remission Patient will benefit from intensive psychotherapy.  Opioid and benzodiazepine dependence-prescribed We will continue to monitor patient closely given his past history of psychosis also likely contributed to by substance abuse.  Have reviewed the following labs in [facility] R-TSH-within normal limits-09/18/2017, lipid  panel- 05/10/2017 HDL 38, LDL 110, hemoglobin A1c 05/10/2017-4.8-within normal limits.  Patient had EKG done on 05/09/2017- QTC shortened otherwise within normal limits.  Follow-up in clinic in 2 weeks .  Reviewed medical records in [facility] Per his most recent inpatient mental health admission on 05/09/2017.  I did medication education, provided handouts.  More than 50 % of the time was spent for psychoeducation and supportive psychotherapy and care coordination.  This note was generated in part or whole with voice recognition software. Voice recognition is usually quite accurate  but  there are transcription errors that can and very often do occur. I apologize for any typographical errors that were not detected and corrected.            Jomarie Longs, MD 9/12/20198:47 AM

## 2017-12-31 NOTE — Patient Instructions (Signed)
Quetiapine tablets What is this medicine? QUETIAPINE (kwe TYE a peen) is an antipsychotic. It is used to treat schizophrenia and bipolar disorder, also known as manic-depression. This medicine may be used for other purposes; ask your health care provider or pharmacist if you have questions. COMMON BRAND NAME(S): Seroquel What should I tell my health care provider before I take this medicine? They need to know if you have any of these conditions: -brain tumor or head injury -breast cancer -cataracts -diabetes -difficulty swallowing -heart disease -kidney disease -liver disease -low blood counts, like low white cell, platelet, or red cell counts -low blood pressure or dizziness when standing up -Parkinson's disease -previous heart attack -seizures -suicidal thoughts, plans, or attempt by you or a family member -thyroid disease -an unusual or allergic reaction to quetiapine, other medicines, foods, dyes, or preservatives -pregnant or trying to get pregnant -breast-feeding How should I use this medicine? Take this medicine by mouth. Swallow it with a drink of water. Follow the directions on the prescription label. If it upsets your stomach you can take it with food. Take your medicine at regular intervals. Do not take it more often than directed. Do not stop taking except on the advice of your doctor or health care professional. A special MedGuide will be given to you by the pharmacist with each prescription and refill. Be sure to read this information carefully each time. Talk to your pediatrician regarding the use of this medicine in children. While this drug may be prescribed for children as young as 10 years for selected conditions, precautions do apply. Patients over age 65 years may have a stronger reaction to this medicine and need smaller doses. Overdosage: If you think you have taken too much of this medicine contact a poison control center or emergency room at once. NOTE: This  medicine is only for you. Do not share this medicine with others. What if I miss a dose? If you miss a dose, take it as soon as you can. If it is almost time for your next dose, take only that dose. Do not take double or extra doses. What may interact with this medicine? Do not take this medicine with any of the following medications: -certain medicines for fungal infections like fluconazole, itraconazole, ketoconazole, posaconazole, voriconazole -cisapride -dofetilide -dronedarone -droperidol -grepafloxacin -halofantrine -phenothiazines like chlorpromazine, mesoridazine, thioridazine -pimozide -sparfloxacin -ziprasidone This medicine may also interact with the following medications: -alcohol -antiviral medicines for HIV or AIDS -certain medicines for blood pressure -certain medicines for depression, anxiety, or psychotic disturbances like haloperidol, lorazepam -certain medicines for diabetes -certain medicines for Parkinson's disease -certain medicines for seizures like carbamazepine, phenobarbital, phenytoin -cimetidine -erythromycin -other medicines that prolong the QT interval (cause an abnormal heart rhythm) -rifampin -steroid medicines like prednisone or cortisone This list may not describe all possible interactions. Give your health care provider a list of all the medicines, herbs, non-prescription drugs, or dietary supplements you use. Also tell them if you smoke, drink alcohol, or use illegal drugs. Some items may interact with your medicine. What should I watch for while using this medicine? Visit your doctor or health care professional for regular checks on your progress. It may be several weeks before you see the full effects of this medicine. Your health care provider may suggest that you have your eyes examined prior to starting this medicine, and every 6 months thereafter. If you have been taking this medicine regularly for some time, do not suddenly stop taking it.  You must gradually   reduce the dose or your symptoms may get worse. Ask your doctor or health care professional for advice. Patients and their families should watch out for worsening depression or thoughts of suicide. Also watch out for sudden or severe changes in feelings such as feeling anxious, agitated, panicky, irritable, hostile, aggressive, impulsive, severely restless, overly excited and hyperactive, or not being able to sleep. If this happens, especially at the beginning of antidepressant treatment or after a change in dose, call your health care professional. You may get dizzy or drowsy. Do not drive, use machinery, or do anything that needs mental alertness until you know how this medicine affects you. Do not stand or sit up quickly, especially if you are an older patient. This reduces the risk of dizzy or fainting spells. Alcohol can increase dizziness and drowsiness. Avoid alcoholic drinks. Do not treat yourself for colds, diarrhea or allergies. Ask your doctor or health care professional for advice, some ingredients may increase possible side effects. This medicine can reduce the response of your body to heat or cold. Dress warm in cold weather and stay hydrated in hot weather. If possible, avoid extreme temperatures like saunas, hot tubs, very hot or cold showers, or activities that can cause dehydration such as vigorous exercise. What side effects may I notice from receiving this medicine? Side effects that you should report to your doctor or health care professional as soon as possible: -allergic reactions like skin rash, itching or hives, swelling of the face, lips, or tongue -difficulty swallowing -fast or irregular heartbeat -fever or chills, sore throat -fever with rash, swollen lymph nodes, or swelling of the face -increased hunger or thirst -increased urination -problems with balance, talking, walking -seizures -stiff muscles -suicidal thoughts or other mood  changes -uncontrollable head, mouth, neck, arm, or leg movements -unusually weak or tired Side effects that usually do not require medical attention (report to your doctor or health care professional if they continue or are bothersome): -change in sex drive or performance -constipation -drowsy or dizzy -dry mouth -stomach upset -weight gain This list may not describe all possible side effects. Call your doctor for medical advice about side effects. You may report side effects to FDA at 1-800-FDA-1088. Where should I keep my medicine? Keep out of the reach of children. Store at room temperature between 15 and 30 degrees C (59 and 86 degrees F). Throw away any unused medicine after the expiration date. NOTE: This sheet is a summary. It may not cover all possible information. If you have questions about this medicine, talk to your doctor, pharmacist, or health care provider.  2018 Elsevier/Gold Standard (2014-10-11 13:07:35)  

## 2018-01-01 ENCOUNTER — Encounter: Payer: Self-pay | Admitting: Psychiatry

## 2018-01-02 ENCOUNTER — Telehealth: Payer: Self-pay

## 2018-01-02 NOTE — Telephone Encounter (Signed)
pls ask him to stop the medication and to go to ED if in a crisis or his symptoms worsens.  pls ask him to be seen in clinic ASAP for an appointment.

## 2018-01-02 NOTE — Telephone Encounter (Signed)
pt was called and told to stop medication per dr. Elna Bresloweappen . pt was told to go to er if his symptoms get worse and that I would call him on monday because pt did not want to come any sooner for appt.

## 2018-01-02 NOTE — Telephone Encounter (Signed)
pt called left message on lea's voicemail at the front desk.  states he is having paplations and insomina states he can not take the medication.

## 2018-01-05 ENCOUNTER — Telehealth: Payer: Self-pay

## 2018-01-05 ENCOUNTER — Ambulatory Visit (INDEPENDENT_AMBULATORY_CARE_PROVIDER_SITE_OTHER): Payer: Managed Care, Other (non HMO) | Admitting: Family Medicine

## 2018-01-05 ENCOUNTER — Other Ambulatory Visit: Payer: Self-pay | Admitting: Family Medicine

## 2018-01-05 ENCOUNTER — Encounter: Payer: Self-pay | Admitting: Family Medicine

## 2018-01-05 VITALS — BP 110/70 | HR 100 | Temp 98.1°F | Resp 16 | Wt 206.0 lb

## 2018-01-05 DIAGNOSIS — F200 Paranoid schizophrenia: Secondary | ICD-10-CM

## 2018-01-05 DIAGNOSIS — M48062 Spinal stenosis, lumbar region with neurogenic claudication: Secondary | ICD-10-CM | POA: Diagnosis not present

## 2018-01-05 DIAGNOSIS — F1220 Cannabis dependence, uncomplicated: Secondary | ICD-10-CM | POA: Diagnosis not present

## 2018-01-05 MED ORDER — TIZANIDINE HCL 4 MG PO TABS: 4.0000 mg | ORAL_TABLET | Freq: Three times a day (TID) | ORAL | 1 refills | Status: DC

## 2018-01-05 NOTE — Telephone Encounter (Signed)
Patient has two doses available - one for seroquel as needed during the day for severe anxiety and the other one at bedtime. I would not increase it yet. Will see him when he is back in clinic .

## 2018-01-05 NOTE — Progress Notes (Signed)
Patient: David Reeves Male    DOB: 1970/06/08   47 y.o.   MRN: [id number] Visit Date: 01/05/2018  Today's Provider: Dortha Kern, PA   Chief Complaint  Patient presents with  . Follow-up   Subjective:    HPI  Started psychiatric care with Dr. Elna Breslow for anxiety, paranoid schizophrenia, panic attacks, insomnia and marijuana abuse on 12-31-17 with Dr. Elna Breslow. 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 Has been on benzodiazepine and opiate for many years, starting in 2012. Was treated at pain management clinics with Oxycodone, Oxycontin, Percocet and recommended fusion for multilevel DDD. He has refused to use these drugs and refused surgery. He is presently on disability and unable to work at any kind of job. Feels his back pain and mental disorder is under better control now.     Past Medical History:  Diagnosis Date  . Back pain 2014  . Bone spur   . Bulging disc 2014  . Degenerative disc disease   . Osteoarthritis   . Panic anxiety syndrome   . Taking multiple medications for chronic disease    Past Surgical History:  Procedure Laterality Date  . CYSTECTOMY     head  . HERNIA REPAIR Left 2006,2014   [facility]  . TOE SURGERY Right 2007   Family History  Problem Relation Age of Onset  . Diabetes Mother   . Diabetes Maternal Uncle   . Diabetes Maternal Grandmother   . Heart disease Maternal Grandmother    Allergies  Allergen Reactions  . Duloxetine     Other reaction(s): Other (See Comments) Increased temperature, sweating, uncontrolled shaking, aggressive thoughts  . Amitriptyline Other (See Comments)    Urine retention  . Buspirone Other (See Comments)    Kidney pain  . Ciprofloxacin Other (See Comments)    hallucinations  . Doxepin Other (See Comments)    "Didn't feel right"  . Escitalopram Other (See Comments)   Agitation  . Olanzapine   . Penicillins Other (See Comments)    Unknown -- childhood reaction  . Sertraline Other (See Comments)    Urine retention  . Trazodone Other (See Comments)    abd pain, urine retention    Current Outpatient Medications:  .  Ascorbic Acid (VITAMIN C PO), Take by mouth daily., Disp: , Rfl:  .  Calcium Carb-Cholecalciferol (CALCIUM+D3) 600-800 MG-UNIT TABS, Take by mouth., Disp: , Rfl:  .  celecoxib (CELEBREX) 200 MG capsule, Take 1 capsule by mouth  twice daily as needed, Disp: 180 capsule, Rfl: 2 .  Cholecalciferol (VITAMIN D3) 2000 units TABS, Take by mouth., Disp: , Rfl:  .  clonazePAM (KLONOPIN) 1 MG tablet, Take 2 tablets in the morning, 1 afternoon and 1 at bedtime by mouth as needed for anxiety/panic., Disp: 100 tablet, Rfl: 0 .  diphenhydrAMINE HCl, Sleep, (UNISOM SLEEPGELS) 50 MG CAPS, Take 50 mg by mouth as needed., Disp: , Rfl:  .  Glucosamine-Chondroit-Vit C-Mn (GLUCOSAMINE CHONDR 1500 COMPLX PO), Take by mouth., Disp: , Rfl:  .  HYDROcodone-acetaminophen (NORCO) 7.5-325 MG tablet, Take 1 tablet by mouth 3 (three) times daily as needed., Disp: 90 tablet, Rfl: 0 .  Melatonin 5 MG CHEW, Chew by mouth as needed., Disp: , Rfl:  .  Multiple Vitamin (MULTIVITAMIN) tablet, Take 1 tablet by mouth daily., Disp: , Rfl:  .  Probiotic Product (PROBIOTIC COLON SUPPORT  PO), Take by mouth., Disp: , Rfl:  .  QUEtiapine (SEROQUEL) 25 MG tablet, Take 0.5-1 tablets (12.5-25 mg total) by mouth at bedtime. For severe anxiety/agitation, Disp: 30 tablet, Rfl: 0 .  QUEtiapine (SEROQUEL) 50 MG tablet, Take 1 tablet (50 mg total) by mouth at bedtime. For mood as well as sleep, Disp: 30 tablet, Rfl: 0 .  tiZANidine (ZANAFLEX) 4 MG tablet, Take 1 tablet (4 mg total) by mouth 3 (three) times daily., Disp: 270 tablet, Rfl: 1 .  UNABLE TO FIND, cbd, Disp: , Rfl:   Review of Systems  Constitutional: Negative.   HENT: Negative.   Eyes: Negative.   Respiratory: Negative.     Cardiovascular: Negative.   Gastrointestinal: Negative.   Genitourinary: Negative.   Musculoskeletal: Positive for back pain.  Psychiatric/Behavioral: Positive for agitation, behavioral problems and sleep disturbance. The patient is nervous/anxious.     Social History   Tobacco Use  . Smoking status: Never Smoker  . Smokeless tobacco: Never Used  . Tobacco comment: cbd    Substance Use Topics  . Alcohol use: Not Currently    Comment: last used 12-22-17   Objective:   BP 110/70 (BP Location: Right Arm, Patient Position: Sitting, Cuff Size: Normal)   Pulse 100   Temp 98.1 F (36.7 C) (Oral)   Resp 16   Wt 206 lb (93.4 kg)   SpO2 97%   BMI 27.18 kg/m  Vitals:   01/05/18 0823  BP: 110/70  Pulse: 100  Resp: 16  Temp: 98.1 F (36.7 C)  TempSrc: Oral  SpO2: 97%  Weight: 206 lb (93.4 kg)   Physical Exam  Constitutional: He is oriented to person, place, and time. He appears well-developed and well-nourished. No distress.  HENT:  Head: Normocephalic and atraumatic.  Right Ear: Hearing normal.  Left Ear: Hearing normal.  Nose: Nose normal.  Eyes: Conjunctivae and lids are normal. Right eye exhibits no discharge. Left eye exhibits no discharge. No scleral icterus.  Neck: Neck supple.  Cardiovascular: Normal rate and regular rhythm.  Pulmonary/Chest: Effort normal and breath sounds normal. No respiratory distress.  Abdominal: Soft. Bowel sounds are normal.  Musculoskeletal:  Tender lower thoracic/upper lumbar and lower lumbar region to palpation. Muscle tightness to bend over.  Neurological: He is alert and oriented to person, place, and time.  Pressure pain in lower lumbar region to test strength of right foot dorsiflexion. Unable to elicit DTR's both knees. No numbness or muscle weakness.   Skin: Skin is intact. No lesion and no rash noted.  Psychiatric: He has a normal mood and affect. His speech is normal and behavior is normal. Thought content normal.     Assessment  & Plan:     1. Paranoid schizophrenia (HCC) Feeling better and less paranoid with the Seroquel 75 mg qd. Continues to use the Klonopin 1 mg 2 tablets AM, 1 afternoon and 1 at HS for anxiety and muscle spasms in back. Will recheck liver and kidney function. Recommend he stay away from marijuana and continue psychiatry follow up. - CBC with Differential/Platelet - Comprehensive metabolic panel  2. Cannabis use disorder, moderate, dependence (HCC) Stopped marijuana 1-2 months ago. Denies any hyperemesis syndrome. Felt back pain and anxiety/panic was better controlled when he was using marijuana. Continue psychiatry follow up to help with cannabinoid dependence.  3. Lumbar stenosis with neurogenic claudication Continues to have muscle spasms with certain movements and occasional shape stabbing pains in the lower back. History of chronic back pain controlled down  to a 2-3/10 level with use of Tizanidine 4 mg TID, Norco 7.5-325 mg 1 tablet TID and Celebrex 200 mg BID. Moist heat applications and back brace helps him be a little more active with less pain. Probably need to schedule pain management referral. - tiZANidine (ZANAFLEX) 4 MG tablet; Take 1 tablet (4 mg total) by mouth 3 (three) times daily.  Dispense: 270 tablet; Refill: 1       Dortha Kern, PA  [facility]

## 2018-01-05 NOTE — Telephone Encounter (Signed)
called pt to check on him if he was doing better ... pt states he started by on the medication .  he states that he is doing well .  he states that he like to have a increase on the seroquel .  he states that you verbally told him one thing but the bottles says at night.

## 2018-01-06 ENCOUNTER — Telehealth: Payer: Self-pay

## 2018-01-06 LAB — CBC WITH DIFFERENTIAL/PLATELET
Basophils Absolute: 0 10*3/uL (ref 0.0–0.2)
Basos: 1 %
EOS (ABSOLUTE): 0.1 10*3/uL (ref 0.0–0.4)
Eos: 2 %
Hematocrit: 41 % (ref 37.5–51.0)
Hemoglobin: 13.9 g/dL (ref 13.0–17.7)
Immature Grans (Abs): 0 10*3/uL (ref 0.0–0.1)
Immature Granulocytes: 0 %
Lymphocytes Absolute: 1.6 10*3/uL (ref 0.7–3.1)
Lymphs: 35 %
MCH: 32.4 pg (ref 26.6–33.0)
MCHC: 33.9 g/dL (ref 31.5–35.7)
MCV: 96 fL (ref 79–97)
Monocytes Absolute: 0.5 10*3/uL (ref 0.1–0.9)
Monocytes: 10 %
Neutrophils Absolute: 2.3 10*3/uL (ref 1.4–7.0)
Neutrophils: 52 %
Platelets: 268 10*3/uL (ref 150–450)
RBC: 4.29 x10E6/uL (ref 4.14–5.80)
RDW: 12.7 % (ref 12.3–15.4)
WBC: 4.5 10*3/uL (ref 3.4–10.8)

## 2018-01-06 LAB — COMPREHENSIVE METABOLIC PANEL
ALT: 14 IU/L (ref 0–44)
AST: 9 IU/L (ref 0–40)
Albumin/Globulin Ratio: 1.9 (ref 1.2–2.2)
Albumin: 4.1 g/dL (ref 3.5–5.5)
Alkaline Phosphatase: 63 IU/L (ref 39–117)
BUN/Creatinine Ratio: 13 (ref 9–20)
BUN: 12 mg/dL (ref 6–24)
Bilirubin Total: 0.4 mg/dL (ref 0.0–1.2)
CO2: 24 mmol/L (ref 20–29)
Calcium: 9.1 mg/dL (ref 8.7–10.2)
Chloride: 106 mmol/L (ref 96–106)
Creatinine, Ser: 0.91 mg/dL (ref 0.76–1.27)
GFR calc Af Amer: 116 mL/min/{1.73_m2} (ref >59)
GFR calc non Af Amer: 100 mL/min/{1.73_m2} (ref >59)
Globulin, Total: 2.2 g/dL (ref 1.5–4.5)
Glucose: 97 mg/dL (ref 65–99)
Potassium: 4.8 mmol/L (ref 3.5–5.2)
Sodium: 142 mmol/L (ref 134–144)
Total Protein: 6.3 g/dL (ref 6.0–8.5)

## 2018-01-06 NOTE — Telephone Encounter (Signed)
-----   Message from Tamsen Roersennis E Chrismon, [place] sent at 01/06/2018  1:48 PM EDT ----- CBC and CMP all normal blood tests. Continue follow up with Dr. Elna BreslowEappen (psychiatrist) and consider tapering back on Clonazepam once up on full dose of Seroquel. Schedule follow up here in 3 months.

## 2018-01-06 NOTE — Telephone Encounter (Signed)
Patient advised.KW 

## 2018-01-09 ENCOUNTER — Telehealth: Payer: Self-pay | Admitting: Family Medicine

## 2018-01-09 ENCOUNTER — Other Ambulatory Visit: Payer: Self-pay | Admitting: Family Medicine

## 2018-01-09 DIAGNOSIS — S32010K Wedge compression fracture of first lumbar vertebra, subsequent encounter for fracture with nonunion: Secondary | ICD-10-CM

## 2018-01-09 DIAGNOSIS — M48062 Spinal stenosis, lumbar region with neurogenic claudication: Secondary | ICD-10-CM

## 2018-01-09 MED ORDER — HYDROCODONE-ACETAMINOPHEN 7.5-325 MG PO TABS: 1.0000 | ORAL_TABLET | Freq: Three times a day (TID) | ORAL | 0 refills | Status: DC | PRN

## 2018-01-09 NOTE — Telephone Encounter (Signed)
Done. Keep follow up appointment with Dr. Elna BreslowEappen (psychiatrist) on 01-14-18 and follow their recommendations regarding medication adjustments.

## 2018-01-09 NOTE — Telephone Encounter (Signed)
Patient needs refills on Hydrocodone 7.5-325 mg. [facility] [address]

## 2018-01-13 ENCOUNTER — Other Ambulatory Visit: Payer: Self-pay | Admitting: Family Medicine

## 2018-01-13 DIAGNOSIS — F064 Anxiety disorder due to known physiological condition: Secondary | ICD-10-CM

## 2018-01-13 NOTE — Telephone Encounter (Signed)
Pt needing refill on clonazePAM (KLONOPIN) 1 MG tablet.  Pt has 3 days left of Rx.  [facility] at: [id number] [address] [place], [place] - [address] [facility] [address] [phone] (Phone) [phone] (Fax    Thanks,  [facility]

## 2018-01-14 ENCOUNTER — Telehealth: Payer: Self-pay | Admitting: Family Medicine

## 2018-01-14 ENCOUNTER — Ambulatory Visit: Payer: Self-pay | Admitting: Psychiatry

## 2018-01-14 DIAGNOSIS — F200 Paranoid schizophrenia: Secondary | ICD-10-CM

## 2018-01-14 NOTE — Telephone Encounter (Signed)
QUEtiapine (SEROQUEL) 50 MG tablet QUEtiapine (SEROQUEL) 25 MG tablet  Physiatrist Saramma Eappen asked pt to discontinue taking the 75 MG Seroquel 2 weeks ago because it was effecting his heartbeat.   Pt no longer sees her because his insurance doesn't cover her.  Pt is asking for another physiatrist that is in his network.   Pt is needing a refill on: clonazePAM (KLONOPIN) 1 MG tablet  Please call into: [facility][id number] [address], [place] - [address] AT [facility] OF [address] [phone] (Phone) [phone] (Fax)    Please advise.  Thanks, [employer]

## 2018-01-15 MED ORDER — CLONAZEPAM 1 MG PO TABS: ORAL_TABLET | ORAL | 0 refills | Status: DC

## 2018-01-15 NOTE — Telephone Encounter (Signed)
Schedule referral to pain management for chronic back pain (physiatry) and psychiatry for schizophrenia (Dr. Elna Breslow is a psychiatrist).

## 2018-01-19 NOTE — Telephone Encounter (Signed)
Please refer.   Thanks,   -Vernona Rieger

## 2018-01-21 ENCOUNTER — Telehealth: Payer: Self-pay | Admitting: Family Medicine

## 2018-01-21 NOTE — Telephone Encounter (Signed)
I left a message asking the pt to call me at [phone] to schedule AWV-I w/ [facility]. VDM (DD) °

## 2018-01-25 ENCOUNTER — Other Ambulatory Visit: Payer: Self-pay | Admitting: Psychiatry

## 2018-01-25 DIAGNOSIS — F4010 Social phobia, unspecified: Secondary | ICD-10-CM

## 2018-01-25 DIAGNOSIS — F410 Panic disorder [episodic paroxysmal anxiety] without agoraphobia: Secondary | ICD-10-CM

## 2018-01-25 DIAGNOSIS — F200 Paranoid schizophrenia: Secondary | ICD-10-CM

## 2018-01-28 NOTE — Telephone Encounter (Signed)
Spoke to pt and advised that he would need to see Dr Elna Breslow one more time before switching to another dr.  Rock Nephew said he couldn't do that bc the dr is out of network.  I explained to pt that I was only relaying the message from dr and he he ended up hanging up on me.  dbs

## 2018-02-06 ENCOUNTER — Other Ambulatory Visit: Payer: Self-pay | Admitting: Family Medicine

## 2018-02-06 ENCOUNTER — Telehealth: Payer: Self-pay | Admitting: Family Medicine

## 2018-02-06 DIAGNOSIS — F064 Anxiety disorder due to known physiological condition: Secondary | ICD-10-CM

## 2018-02-06 MED ORDER — CLONAZEPAM 1 MG PO TABS: ORAL_TABLET | ORAL | 0 refills | Status: DC

## 2018-02-06 NOTE — Telephone Encounter (Signed)
Pt needing a refill on:  clonazePAM (KLONOPIN) 1 MG tablet  Please fill at:   [facility][id number] [address], [place] - [address] AT [facility] [address] [phone] (Phone) [phone] (Fax)     Thanks, [facility]

## 2018-02-06 NOTE — Telephone Encounter (Signed)
Done. May pick up at the pharmacy on 02-09-18

## 2018-02-09 ENCOUNTER — Telehealth: Payer: Self-pay | Admitting: Family Medicine

## 2018-02-09 ENCOUNTER — Other Ambulatory Visit: Payer: Self-pay | Admitting: Family Medicine

## 2018-02-09 DIAGNOSIS — M48062 Spinal stenosis, lumbar region with neurogenic claudication: Secondary | ICD-10-CM

## 2018-02-09 DIAGNOSIS — S32010K Wedge compression fracture of first lumbar vertebra, subsequent encounter for fracture with nonunion: Secondary | ICD-10-CM

## 2018-02-09 MED ORDER — HYDROCODONE-ACETAMINOPHEN 7.5-325 MG PO TABS: 1.0000 | ORAL_TABLET | Freq: Three times a day (TID) | ORAL | 0 refills | Status: DC | PRN

## 2018-02-09 NOTE — Telephone Encounter (Signed)
Pt called saying the pharmacy [facility] told him if he only got 1 month of the Clonazepam 1 mg it would cost him 32.00 a month but if you sent in 3 months it would not cost him anything.  They told him that this is considered a maintenance medication   Can you please send a prescription for 3 months to the pharmacy.  Thanks  [employer]

## 2018-02-09 NOTE — Telephone Encounter (Signed)
Pt needing a refill on: ° °HYDROcodone-acetaminophen (NORCO) 7.5-325 MG tablet ° °Please fill at: ° °[id number] - [place], [place] - [address] AT [address] OF [address] [phone] (Phone) °[phone] (Fax)  ° °Thanks, °[facility] °

## 2018-02-09 NOTE — Telephone Encounter (Signed)
Please advised  

## 2018-02-09 NOTE — Telephone Encounter (Signed)
Please review

## 2018-02-09 NOTE — Telephone Encounter (Signed)
Pt advised of rx being sent to pharmacy.  dbs,cma

## 2018-02-10 NOTE — Telephone Encounter (Signed)
Since it was refilled so recently, will have to wait until his follow up appointment on 03-11-18 to consider this change.

## 2018-02-10 NOTE — Telephone Encounter (Signed)
Spoke to pt and advised that he can f-up on next appt for refills of his klonopin  Dbs, cma

## 2018-02-18 ENCOUNTER — Telehealth: Payer: Self-pay | Admitting: Family Medicine

## 2018-02-18 NOTE — Telephone Encounter (Signed)
Please review

## 2018-02-18 NOTE — Telephone Encounter (Signed)
Pt requesting 90 day refill on:  celecoxib (CELEBREX) 200 MG capsule  Please fill at:   [facility][id number] [address], [place] - [address] AT [facility] [address] [phone] (Phone) [phone] (Fax)    Thanks, [facility]

## 2018-02-19 ENCOUNTER — Other Ambulatory Visit: Payer: Self-pay | Admitting: Family Medicine

## 2018-02-19 DIAGNOSIS — M48062 Spinal stenosis, lumbar region with neurogenic claudication: Secondary | ICD-10-CM

## 2018-02-19 MED ORDER — CELECOXIB 200 MG PO CAPS: ORAL_CAPSULE | ORAL | 2 refills | Status: DC

## 2018-02-19 NOTE — Telephone Encounter (Signed)
Done

## 2018-03-11 ENCOUNTER — Ambulatory Visit: Payer: Self-pay

## 2018-03-11 ENCOUNTER — Other Ambulatory Visit: Payer: Self-pay

## 2018-03-11 DIAGNOSIS — M48062 Spinal stenosis, lumbar region with neurogenic claudication: Secondary | ICD-10-CM

## 2018-03-11 DIAGNOSIS — S32010K Wedge compression fracture of first lumbar vertebra, subsequent encounter for fracture with nonunion: Secondary | ICD-10-CM

## 2018-03-12 MED ORDER — HYDROCODONE-ACETAMINOPHEN 7.5-325 MG PO TABS: 1.0000 | ORAL_TABLET | Freq: Three times a day (TID) | ORAL | 0 refills | Status: DC | PRN

## 2018-03-18 ENCOUNTER — Ambulatory Visit: Payer: Self-pay

## 2018-03-21 ENCOUNTER — Other Ambulatory Visit: Payer: Self-pay | Admitting: Family Medicine

## 2018-03-24 ENCOUNTER — Telehealth: Payer: Self-pay

## 2018-03-24 NOTE — Telephone Encounter (Signed)
LMTCB and scheduled previously cancelled AWV.  -MM

## 2018-04-03 NOTE — Progress Notes (Deleted)
Patient: David Reeves Male    DOB: [birth year]   47 y.o.   MRN: [id number] Visit Date: 04/03/2018  Today's Provider: Dortha Kern, PA   No chief complaint on file.  Subjective:     HPI   Paranoid schizophrenia (HCC) From 01/05/2018-labs checked. Advised to continue follow up with Dr. Elna Breslow (psychiatrist) and consider tapering back on Clonazepam once up on full dose of Seroquel. Schedule follow up here in 3 months.  Cannabis use disorder, moderate, dependence (HCC) From 01/05/2018-Continue psychiatry follow up to help with cannabinoid dependence.  Lumbar stenosis with neurogenic claudication From 01/05/2018-no changes. Continue current medications. Probably need to schedule pain management referral.   Allergies  Allergen Reactions  . Duloxetine     Other reaction(s): Other (See Comments) Increased temperature, sweating, uncontrolled shaking, aggressive thoughts  . Amitriptyline Other (See Comments)    Urine retention  . Buspirone Other (See Comments)    Kidney pain  . Ciprofloxacin Other (See Comments)    hallucinations  . Doxepin Other (See Comments)    "Didn't feel right"  . Escitalopram Other (See Comments)    Agitation  . Olanzapine   . Penicillins Other (See Comments)    Unknown -- childhood reaction  . Sertraline Other (See Comments)    Urine retention  . Trazodone Other (See Comments)    abd pain, urine retention     Current Outpatient Medications:  .  Ascorbic Acid (VITAMIN C PO), Take by mouth daily., Disp: , Rfl:  .  Calcium Carb-Cholecalciferol (CALCIUM+D3) 600-800 MG-UNIT TABS, Take by mouth., Disp: , Rfl:  .  celecoxib (CELEBREX) 200 MG capsule, Take 1 capsule by mouth  twice daily as needed, Disp: 180 capsule, Rfl: 2 .  Cholecalciferol (VITAMIN D3) 2000 units TABS, Take by mouth., Disp: , Rfl:  .  clonazePAM (KLONOPIN) 1 MG tablet, Take 2 tablets in the morning, 1 afternoon and 1 at bedtime by mouth as needed for anxiety/panic.,  Disp: 100 tablet, Rfl: 0 .  diphenhydrAMINE HCl, Sleep, (UNISOM SLEEPGELS) 50 MG CAPS, Take 50 mg by mouth as needed., Disp: , Rfl:  .  Glucosamine-Chondroit-Vit C-Mn (GLUCOSAMINE CHONDR 1500 COMPLX PO), Take by mouth., Disp: , Rfl:  .  HYDROcodone-acetaminophen (NORCO) 7.5-325 MG tablet, Take 1 tablet by mouth 3 (three) times daily as needed., Disp: 90 tablet, Rfl: 0 .  Melatonin 5 MG CHEW, Chew by mouth as needed., Disp: , Rfl:  .  Multiple Vitamin (MULTIVITAMIN) tablet, Take 1 tablet by mouth daily., Disp: , Rfl:  .  Probiotic Product (PROBIOTIC COLON SUPPORT PO), Take by mouth., Disp: , Rfl:  .  QUEtiapine (SEROQUEL) 25 MG tablet, Take 0.5-1 tablets (12.5-25 mg total) by mouth at bedtime. For severe anxiety/agitation, Disp: 30 tablet, Rfl: 0 .  QUEtiapine (SEROQUEL) 50 MG tablet, Take 1 tablet (50 mg total) by mouth at bedtime. For mood as well as sleep, Disp: 30 tablet, Rfl: 0 .  tiZANidine (ZANAFLEX) 4 MG tablet, TAKE 1 TABLET BY MOUTH 3  TIMES DAILY, Disp: 270 tablet, Rfl: 1 .  UNABLE TO FIND, cbd, Disp: , Rfl:   Review of Systems  Constitutional: Negative for appetite change, chills and fever.  Respiratory: Negative for chest tightness, shortness of breath and wheezing.   Cardiovascular: Negative for chest pain and palpitations.  Gastrointestinal: Negative for abdominal pain, nausea and vomiting.    Social History   Tobacco Use  . Smoking status: Never Smoker  . Smokeless tobacco: Never Used  .  Tobacco comment: cbd    Substance Use Topics  . Alcohol use: Not Currently    Comment: last used 12-22-17      Objective:   There were no vitals taken for this visit. There were no vitals filed for this visit.   Physical Exam      Assessment & Plan        Dortha Kernennis Chrismon, PA  [facility]

## 2018-04-06 ENCOUNTER — Ambulatory Visit: Payer: Self-pay | Admitting: Family Medicine

## 2018-04-06 ENCOUNTER — Telehealth: Payer: Self-pay | Admitting: Family Medicine

## 2018-04-06 NOTE — Telephone Encounter (Signed)
Wife reports patient was incarcerated due to paranoia with hallucinations causing him to wield a gun unsafely. States a friend of his gave him a "couple guns". She states he is a felon and should not have a gun in his possession. She wanted to alert this office he has been in jail since 03-23-18 and she "won't get him out". She feels he needs to be committed for inpatient behavioral medicine treatment. Was not able to share any medical/confidential information with her because he had not signed a HIPAA release for her.

## 2018-04-06 NOTE — Telephone Encounter (Signed)
Please advise 

## 2018-04-06 NOTE — Telephone Encounter (Signed)
[facility] needing Dr. Shella Spearinghrismon to call.  Pt cannot make the appt today.  Arrested - in jail - schizophrenia symptoms. Needing advise for husband pt.  Please call wife Ferdinand Cava- Mikki  [phone] - cell.  Keep calling until wife can answer.  Thanks, [employer] [facility] [employer]

## 2018-09-10 ENCOUNTER — Encounter: Payer: Self-pay | Admitting: Family Medicine

## 2018-09-10 ENCOUNTER — Ambulatory Visit: Payer: Medicare Other | Admitting: Family Medicine

## 2018-09-10 ENCOUNTER — Ambulatory Visit (INDEPENDENT_AMBULATORY_CARE_PROVIDER_SITE_OTHER): Payer: Medicare Other | Admitting: Family Medicine

## 2018-09-10 ENCOUNTER — Other Ambulatory Visit: Payer: Self-pay

## 2018-09-10 VITALS — BP 136/84 | HR 103 | Temp 99.1°F | Resp 18 | Wt 233.0 lb

## 2018-09-10 DIAGNOSIS — F064 Anxiety disorder due to known physiological condition: Secondary | ICD-10-CM | POA: Diagnosis not present

## 2018-09-10 DIAGNOSIS — F410 Panic disorder [episodic paroxysmal anxiety] without agoraphobia: Secondary | ICD-10-CM

## 2018-09-10 DIAGNOSIS — M5441 Lumbago with sciatica, right side: Secondary | ICD-10-CM | POA: Diagnosis not present

## 2018-09-10 DIAGNOSIS — S32010K Wedge compression fracture of first lumbar vertebra, subsequent encounter for fracture with nonunion: Secondary | ICD-10-CM

## 2018-09-10 DIAGNOSIS — G8929 Other chronic pain: Secondary | ICD-10-CM | POA: Diagnosis not present

## 2018-09-10 DIAGNOSIS — M48062 Spinal stenosis, lumbar region with neurogenic claudication: Secondary | ICD-10-CM

## 2018-09-10 DIAGNOSIS — G894 Chronic pain syndrome: Secondary | ICD-10-CM | POA: Diagnosis not present

## 2018-09-10 MED ORDER — CLONAZEPAM 1 MG PO TABS: 1.0000 mg | ORAL_TABLET | Freq: Two times a day (BID) | ORAL | 0 refills | Status: DC | PRN

## 2018-09-10 MED ORDER — MELOXICAM 15 MG PO TABS: 7.5000 mg | ORAL_TABLET | Freq: Every day | ORAL | 3 refills | Status: DC

## 2018-09-10 MED ORDER — HYDROCODONE-ACETAMINOPHEN 7.5-325 MG PO TABS: 1.0000 | ORAL_TABLET | Freq: Two times a day (BID) | ORAL | 0 refills | Status: DC | PRN

## 2018-09-10 NOTE — Progress Notes (Signed)
Patient: David Reeves Male    DOB: 1970-08-12   48 y.o.   MRN: [id number] Visit Date: 09/10/2018  Today's Provider: Dortha Kern, PA   Chief Complaint  Patient presents with   Back Pain   Subjective:     HPI   Patient states that he need refills on medications. He is also having lower back pain, and muscle spasms. Went back on the Norco, Zanaflex, Celebrex and Clonazepam for chronic back pain syndrome with compression of T12/L1 vertebrae and panic disorder after 148 days of incarceration for illegal possession of a firearm by a felon. His family has split and he is living alone. He has been back home for the past 3 weeks and feels pain and panic attacks are controlled by restarting medications.  Past Medical History:  Diagnosis Date   Back pain 2014   Bone spur    Bulging disc 2014   Degenerative disc disease    Osteoarthritis    Panic anxiety syndrome    Taking multiple medications for chronic disease    Past Surgical History:  Procedure Laterality Date   CYSTECTOMY     head   HERNIA REPAIR Left 2006,2014   [facility]   TOE SURGERY Right 2007   Family History  Problem Relation Age of Onset   Diabetes Mother    Diabetes Maternal Uncle    Diabetes Maternal Grandmother    Heart disease Maternal Grandmother    Allergies  Allergen Reactions   Duloxetine     Other reaction(s): Other (See Comments) Increased temperature, sweating, uncontrolled shaking, aggressive thoughts   Amitriptyline Other (See Comments)    Urine retention   Buspirone Other (See Comments)    Kidney pain   Ciprofloxacin Other (See Comments)    hallucinations   Doxepin Other (See Comments)    "Didn't feel right"   Escitalopram Other (See Comments)    Agitation   Olanzapine    Penicillins Other (See Comments)    Unknown -- childhood reaction   Sertraline Other (See Comments)    Urine retention   Trazodone Other (See Comments)    abd pain, urine retention     Current Outpatient Medications:    Ascorbic Acid (VITAMIN C PO), Take by mouth daily., Disp: , Rfl:    Calcium Carb-Cholecalciferol (CALCIUM+D3) 600-800 MG-UNIT TABS, Take by mouth., Disp: , Rfl:    celecoxib (CELEBREX) 200 MG capsule, Take 1 capsule by mouth  twice daily as needed (Patient not taking: Reported on 09/10/2018), Disp: 180 capsule, Rfl: 2   Cholecalciferol (VITAMIN D3) 2000 units TABS, Take by mouth., Disp: , Rfl:    clonazePAM (KLONOPIN) 1 MG tablet, Take 2 tablets in the morning, 1 afternoon and 1 at bedtime by mouth as needed for anxiety/panic. (Patient not taking: Reported on 09/10/2018), Disp: 100 tablet, Rfl: 0   diphenhydrAMINE HCl, Sleep, (UNISOM SLEEPGELS) 50 MG CAPS, Take 50 mg by mouth as needed., Disp: , Rfl:    Glucosamine-Chondroit-Vit C-Mn (GLUCOSAMINE CHONDR 1500 COMPLX PO), Take by mouth., Disp: , Rfl:    HYDROcodone-acetaminophen (NORCO) 7.5-325 MG tablet, Take 1 tablet by mouth 3 (three) times daily as needed. (Patient not taking: Reported on 09/10/2018), Disp: 90 tablet, Rfl: 0   Melatonin 5 MG CHEW, Chew by mouth as needed., Disp: , Rfl:    Multiple Vitamin (MULTIVITAMIN) tablet, Take 1 tablet by mouth daily., Disp: , Rfl:    Probiotic Product (PROBIOTIC COLON SUPPORT PO), Take by mouth., Disp: , Rfl:  QUEtiapine (SEROQUEL) 25 MG tablet, Take 0.5-1 tablets (12.5-25 mg total) by mouth at bedtime. For severe anxiety/agitation (Patient not taking: Reported on 09/10/2018), Disp: 30 tablet, Rfl: 0   QUEtiapine (SEROQUEL) 50 MG tablet, Take 1 tablet (50 mg total) by mouth at bedtime. For mood as well as sleep (Patient not taking: Reported on 09/10/2018), Disp: 30 tablet, Rfl: 0   tiZANidine (ZANAFLEX) 4 MG tablet, TAKE 1 TABLET BY MOUTH 3  TIMES DAILY (Patient not taking: Reported on 09/10/2018), Disp: 270 tablet, Rfl: 1   UNABLE TO FIND, cbd, Disp: , Rfl:   Review of Systems  Musculoskeletal: Positive for back pain.  All other systems reviewed and are  negative.  Social History   Tobacco Use   Smoking status: Never Smoker   Smokeless tobacco: Never Used   Tobacco comment: cbd    Substance Use Topics   Alcohol use: Not Currently    Comment: last used 12-22-17     Objective:   BP 136/84 (BP Location: Left Arm, Patient Position: Sitting, Cuff Size: Normal)    Pulse (!) 103    Temp 99.1 F (37.3 C) (Oral)    Resp 18    Wt 233 lb (105.7 kg)    SpO2 97%    BMI 30.74 kg/m  Vitals:   09/10/18 1410  BP: 136/84  Pulse: (!) 103  Resp: 18  Temp: 99.1 F (37.3 C)  TempSrc: Oral  SpO2: 97%  Weight: 233 lb (105.7 kg)   Physical Exam Constitutional:      Appearance: He is well-developed.  HENT:     Head: Normocephalic and atraumatic.     Right Ear: Tympanic membrane and external ear normal.     Left Ear: Tympanic membrane and external ear normal.     Nose: Nose normal.     Mouth/Throat:     Pharynx: Oropharynx is clear.  Eyes:     General:        Right eye: No discharge.     Conjunctiva/sclera: Conjunctivae normal.     Pupils: Pupils are equal, round, and reactive to light.  Neck:     Musculoskeletal: Normal range of motion and neck supple.     Thyroid: No thyromegaly.     Trachea: No tracheal deviation.  Cardiovascular:     Rate and Rhythm: Normal rate and regular rhythm.     Pulses: Normal pulses.     Heart sounds: Normal heart sounds. No murmur.  Pulmonary:     Effort: Pulmonary effort is normal. No respiratory distress.     Breath sounds: Normal breath sounds. No wheezing or rales.  Chest:     Chest wall: No tenderness.  Abdominal:     General: Bowel sounds are normal. There is no distension.     Palpations: Abdomen is soft. There is no mass.     Tenderness: There is no abdominal tenderness. There is no guarding or rebound.  Musculoskeletal:        General: Tenderness present. No deformity.     Right lower leg: No edema.     Left lower leg: No edema.     Comments: Tender in upper lumbar region. Slow ambulation  with minimal spasms today. SLR's to 90 degrees with mild discomfort. No muscle weakness. Some radiation of pain into right posterior leg without numbness.  Lymphadenopathy:     Cervical: No cervical adenopathy.  Skin:    General: Skin is warm and dry.     Findings: No erythema or rash.  Neurological:     Mental Status: He is alert and oriented to person, place, and time.     Cranial Nerves: No cranial nerve deficit.     Motor: No abnormal muscle tone.     Coordination: Coordination normal.     Deep Tendon Reflexes: Reflexes are normal and symmetric. Reflexes normal.  Psychiatric:        Behavior: Behavior normal.        Thought Content: Thought content normal.        Judgment: Judgment normal.     Comments: Lucid and no recent hallucinations. Denies suicidal or homicidal ideation.       Assessment & Plan    1. Chronic 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 Pain persists in the upper lumbar region and some radiation into the right leg. Spasms controlled by use of Clonazepam and Zanaflex. Chronic pain controlled with NSAID and Norco to a tolerable degree. Still does not want to consider any surgical intervention. States he got more relief from using Mobic instead of the Celebrex. Will change to Mobic and limit use of Norco to BID only. Check drug screen and follow up pending lab reports. - HYDROcodone-acetaminophen (NORCO) 7.5-325 MG tablet; Take 1 tablet by mouth 2 (two) times daily as needed.  Dispense: 60 tablet; Refill: 0 - meloxicam (MOBIC) 15 MG tablet; Take 0.5 tablets (7.5 mg total) by mouth daily.  Dispense: 30 tablet; Refill: 3  2. 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 No hallucinations and appears lucid today. Went back on the clonazepam when he returned home 3 weeks ago. Since he had been off most of his medications and has not returned to psychiatric care, recommend he limit use of  clonazepam to 1 mg BID prn anxiety/panic attack prevention. Will get drug screen and follow up pending report. - clonazePAM (KLONOPIN) 1 MG tablet; Take 1 tablet (1 mg total) by mouth 2 (two) times daily as needed for anxiety.  Dispense: 60 tablet; Refill: 0  3. Chronic pain syndrome History of compression fracture of L1 vertebra, lumbar stenosis with neurogenic claudication and severe degenerative disc disease. Will recheck drug screen. States he has not been using marijuana or any street drugs since his incarceration. If any illicit drugs found on test, will discontinue opiate prescriptions. Patient understands plan. - Drug Screen 12+Alcohol+CRT, Ur  4. 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 Pain 6-7/10 at its peak prior to taking Norco. Recommend he restrict use to 1 tablet BID since he had been off it for the 148 days he was incarcerated. Recommend he taper back off this as much as possible. - HYDROcodone-acetaminophen (NORCO) 7.5-325 MG tablet; Take 1 tablet by mouth 2 (two) times daily as needed.  Dispense: 60 tablet; Refill: 0  5. Lumbar stenosis with neurogenic claudication Less radiation of pain from lumbar area to legs. Feels he has had better control of inflammation when he was given Mobic instead of Celebrex while incarcerated.  6. Anxiety 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 No hallucinations and lucid today. Anxiety and panic controlled with use of the Clonazepam. Recommend he limit use to BID of 1 mg now.      Dortha Kern, PA  [facility]

## 2018-09-15 LAB — DRUG SCREEN 12+ALCOHOL+CRT, UR
Amphetamines, Urine: NEGATIVE ng/mL
BENZODIAZ UR QL: POSITIVE ng/mL — AB
Barbiturate: NEGATIVE ng/mL
Cannabinoids: NEGATIVE ng/mL
Cocaine (Metabolite): NEGATIVE ng/mL
Creatinine, Urine: 101.9 mg/dL (ref 20.0–300.0)
Ethanol, Urine: NEGATIVE %
Meperidine: NEGATIVE ng/mL
Methadone: NEGATIVE ng/mL
OPIATE SCREEN URINE: NEGATIVE ng/mL
Oxycodone/Oxymorphone, Urine: NEGATIVE ng/mL
Phencyclidine: NEGATIVE ng/mL
Propoxyphene: NEGATIVE ng/mL
Tramadol: NEGATIVE ng/mL

## 2018-09-15 LAB — SPECIMEN STATUS REPORT

## 2018-09-16 ENCOUNTER — Telehealth: Payer: Self-pay | Admitting: *Deleted

## 2018-09-16 NOTE — Telephone Encounter (Signed)
LMOVM for pt to return call 

## 2018-09-16 NOTE — Telephone Encounter (Signed)
-----   Message from Tamsen Roers, [place] sent at 09/15/2018 12:17 PM EDT ----- Drug screen positive for Benzodiazepine (clonzepam) only. Otherwise, all other results are negative. Keep dosages down to ONLY twice a day and proceed with Meloxicam daily. Want to restrict use of Hydrocodone to BID, also. You have been able to manage pain without these drugs and would like to come off them forever. Schedule recheck in 3 months.

## 2018-09-17 ENCOUNTER — Telehealth: Payer: Self-pay

## 2018-09-17 NOTE — Telephone Encounter (Signed)
Spoke to patient regarding results and medication changes.

## 2018-10-06 ENCOUNTER — Other Ambulatory Visit: Payer: Self-pay

## 2018-10-06 DIAGNOSIS — F410 Panic disorder [episodic paroxysmal anxiety] without agoraphobia: Secondary | ICD-10-CM

## 2018-10-06 DIAGNOSIS — F064 Anxiety disorder due to known physiological condition: Secondary | ICD-10-CM

## 2018-10-06 DIAGNOSIS — G8929 Other chronic pain: Secondary | ICD-10-CM

## 2018-10-06 DIAGNOSIS — M5441 Lumbago with sciatica, right side: Secondary | ICD-10-CM

## 2018-10-06 DIAGNOSIS — S32010K Wedge compression fracture of first lumbar vertebra, subsequent encounter for fracture with nonunion: Secondary | ICD-10-CM

## 2018-10-06 DIAGNOSIS — M48062 Spinal stenosis, lumbar region with neurogenic claudication: Secondary | ICD-10-CM

## 2018-10-06 NOTE — Telephone Encounter (Signed)
Patient is requesting refills be sent to [facility] pharmacy

## 2018-10-09 MED ORDER — HYDROCODONE-ACETAMINOPHEN 7.5-325 MG PO TABS: 1.0000 | ORAL_TABLET | Freq: Two times a day (BID) | ORAL | 0 refills | Status: DC | PRN

## 2018-10-09 MED ORDER — CLONAZEPAM 1 MG PO TABS: 1.0000 mg | ORAL_TABLET | Freq: Two times a day (BID) | ORAL | 0 refills | Status: DC | PRN

## 2018-10-09 MED ORDER — MELOXICAM 15 MG PO TABS: 7.5000 mg | ORAL_TABLET | Freq: Every day | ORAL | 3 refills | Status: DC

## 2018-10-09 NOTE — Telephone Encounter (Signed)
Patient called back concerning refills. Please advise?

## 2018-10-14 DIAGNOSIS — F419 Anxiety disorder, unspecified: Secondary | ICD-10-CM | POA: Diagnosis not present

## 2018-10-27 DIAGNOSIS — F419 Anxiety disorder, unspecified: Secondary | ICD-10-CM | POA: Diagnosis not present

## 2018-11-03 ENCOUNTER — Other Ambulatory Visit: Payer: Self-pay | Admitting: Family Medicine

## 2018-11-03 ENCOUNTER — Other Ambulatory Visit: Payer: Self-pay

## 2018-11-03 DIAGNOSIS — M5441 Lumbago with sciatica, right side: Secondary | ICD-10-CM

## 2018-11-03 DIAGNOSIS — M48062 Spinal stenosis, lumbar region with neurogenic claudication: Secondary | ICD-10-CM

## 2018-11-03 DIAGNOSIS — S32010K Wedge compression fracture of first lumbar vertebra, subsequent encounter for fracture with nonunion: Secondary | ICD-10-CM

## 2018-11-03 DIAGNOSIS — F410 Panic disorder [episodic paroxysmal anxiety] without agoraphobia: Secondary | ICD-10-CM

## 2018-11-03 DIAGNOSIS — F064 Anxiety disorder due to known physiological condition: Secondary | ICD-10-CM

## 2018-11-03 DIAGNOSIS — G8929 Other chronic pain: Secondary | ICD-10-CM

## 2018-11-03 MED ORDER — HYDROCODONE-ACETAMINOPHEN 7.5-325 MG PO TABS: 1.0000 | ORAL_TABLET | Freq: Two times a day (BID) | ORAL | 0 refills | Status: DC | PRN

## 2018-11-03 MED ORDER — CLONAZEPAM 1 MG PO TABS: 1.0000 mg | ORAL_TABLET | Freq: Two times a day (BID) | ORAL | 0 refills | Status: DC | PRN

## 2018-11-03 MED ORDER — TIZANIDINE HCL 4 MG PO TABS: 4.0000 mg | ORAL_TABLET | Freq: Three times a day (TID) | ORAL | 1 refills | Status: DC

## 2018-11-03 NOTE — Addendum Note (Signed)
Addended by: Althea Charon D on: 11/03/2018 02:50 PM   Modules accepted: Orders

## 2018-11-03 NOTE — Telephone Encounter (Signed)
Please note that on 09/10/2018 patient reported he was not taking the Tizanidine

## 2018-11-03 NOTE — Telephone Encounter (Signed)
Pt contacted office for refill request on the following medications:  1. clonazePAM (KLONOPIN) 1 MG tablet  2. HYDROcodone-acetaminophen (NORCO) 7.5-325 MG tablet 3. tiZANidine (ZANAFLEX) 4 MG tablet  [facility] [place]  Pt stated that he is going out of town Sunday and would like to pick up his medications on Saturday. Please advise. Thanks TNP

## 2018-11-06 ENCOUNTER — Other Ambulatory Visit: Payer: Self-pay

## 2018-11-06 DIAGNOSIS — M5441 Lumbago with sciatica, right side: Secondary | ICD-10-CM

## 2018-11-06 DIAGNOSIS — S32010K Wedge compression fracture of first lumbar vertebra, subsequent encounter for fracture with nonunion: Secondary | ICD-10-CM

## 2018-11-06 DIAGNOSIS — F410 Panic disorder [episodic paroxysmal anxiety] without agoraphobia: Secondary | ICD-10-CM

## 2018-11-06 DIAGNOSIS — M48062 Spinal stenosis, lumbar region with neurogenic claudication: Secondary | ICD-10-CM

## 2018-11-06 DIAGNOSIS — G8929 Other chronic pain: Secondary | ICD-10-CM

## 2018-11-06 DIAGNOSIS — F064 Anxiety disorder due to known physiological condition: Secondary | ICD-10-CM

## 2018-11-06 NOTE — Telephone Encounter (Signed)
Patient called requesting refills. Please review. Thanks!  

## 2018-11-06 NOTE — Telephone Encounter (Signed)
Chart indicates [facility] at [facility] received these refills on 11-03-18. He should check with them.

## 2018-11-10 DIAGNOSIS — F419 Anxiety disorder, unspecified: Secondary | ICD-10-CM | POA: Diagnosis not present

## 2018-12-01 ENCOUNTER — Ambulatory Visit: Payer: Medicare Other | Admitting: Family Medicine

## 2018-12-01 DIAGNOSIS — F419 Anxiety disorder, unspecified: Secondary | ICD-10-CM | POA: Diagnosis not present

## 2018-12-03 ENCOUNTER — Other Ambulatory Visit: Payer: Self-pay | Admitting: Family Medicine

## 2018-12-03 DIAGNOSIS — S32010K Wedge compression fracture of first lumbar vertebra, subsequent encounter for fracture with nonunion: Secondary | ICD-10-CM

## 2018-12-03 DIAGNOSIS — M5441 Lumbago with sciatica, right side: Secondary | ICD-10-CM

## 2018-12-03 DIAGNOSIS — F410 Panic disorder [episodic paroxysmal anxiety] without agoraphobia: Secondary | ICD-10-CM

## 2018-12-03 DIAGNOSIS — M48062 Spinal stenosis, lumbar region with neurogenic claudication: Secondary | ICD-10-CM

## 2018-12-03 DIAGNOSIS — F064 Anxiety disorder due to known physiological condition: Secondary | ICD-10-CM

## 2018-12-03 DIAGNOSIS — G8929 Other chronic pain: Secondary | ICD-10-CM

## 2018-12-04 MED ORDER — HYDROCODONE-ACETAMINOPHEN 7.5-325 MG PO TABS: 1.0000 | ORAL_TABLET | Freq: Two times a day (BID) | ORAL | 0 refills | Status: DC | PRN

## 2018-12-14 ENCOUNTER — Other Ambulatory Visit: Payer: Self-pay

## 2018-12-14 ENCOUNTER — Encounter: Payer: Self-pay | Admitting: Family Medicine

## 2018-12-14 ENCOUNTER — Ambulatory Visit (INDEPENDENT_AMBULATORY_CARE_PROVIDER_SITE_OTHER): Payer: Medicare HMO | Admitting: Family Medicine

## 2018-12-14 VITALS — BP 138/70 | HR 87 | Temp 97.5°F | Resp 16 | Wt 235.0 lb

## 2018-12-14 DIAGNOSIS — M5441 Lumbago with sciatica, right side: Secondary | ICD-10-CM | POA: Diagnosis not present

## 2018-12-14 DIAGNOSIS — G8929 Other chronic pain: Secondary | ICD-10-CM

## 2018-12-14 DIAGNOSIS — F064 Anxiety disorder due to known physiological condition: Secondary | ICD-10-CM

## 2018-12-14 NOTE — Progress Notes (Signed)
Patient: David Reeves Male    DOB: 12-02-1970   48 y.o.   MRN: [id number] Visit Date: 12/14/2018  Today's Provider: Vernie Murders, PA   Chief Complaint  Patient presents with  . Follow-up  . Anxiety   Subjective:     Anxiety Presents for follow-up visit. Symptoms include insomnia and muscle tension. Patient reports no chest pain, compulsions, confusion, decreased concentration, depressed mood, dizziness, dry mouth, excessive worry, feeling of choking, hyperventilation, impotence, irritability, malaise, nausea, nervous/anxious behavior, obsessions, palpitations, panic, restlessness, shortness of breath or suicidal ideas. The quality of sleep is poor. Nighttime awakenings: none.   Compliance with medications is 76-100%.    Follow up for Chronic Pain  The patient was last seen for this 4 months ago. Changes made at last visit include .  He reports excellent compliance with treatment. He feels that condition is gradually improving on pain medication. He is not having side effects.   ------------------------------------------------------------------------------------ Past Medical History:  Diagnosis Date  . Back pain 2014  . Bone spur   . Bulging disc 2014  . Degenerative disc disease   . Osteoarthritis   . Panic anxiety syndrome   . Taking multiple medications for chronic disease    Past Surgical History:  Procedure Laterality Date  . CYSTECTOMY     head  . HERNIA REPAIR Left 2006,2014   [facility]  . TOE SURGERY Right 2007   Family History  Problem Relation Age of Onset  . Diabetes Mother   . Diabetes Maternal Uncle   . Diabetes Maternal Grandmother   . Heart disease Maternal Grandmother    Allergies  Allergen Reactions  . Duloxetine     Other reaction(s): Other (See Comments) Increased temperature, sweating, uncontrolled shaking, aggressive thoughts  . Amitriptyline Other (See Comments)    Urine retention  . Buspirone Other (See Comments)   Kidney pain  . Ciprofloxacin Other (See Comments)    hallucinations  . Doxepin Other (See Comments)    "Didn't feel right"  . Escitalopram Other (See Comments)    Agitation  . Olanzapine   . Penicillins Other (See Comments)    Unknown -- childhood reaction  . Sertraline Other (See Comments)    Urine retention  . Trazodone Other (See Comments)    abd pain, urine retention    Current Outpatient Medications:  .  clonazePAM (KLONOPIN) 1 MG tablet, TAKE 1 TABLET BY MOUTH TWICE DAILY AS NEEDED FOR ANXIETY, Disp: 60 tablet, Rfl: 0 .  HYDROcodone-acetaminophen (NORCO) 7.5-325 MG tablet, Take 1 tablet by mouth 2 (two) times daily as needed., Disp: 60 tablet, Rfl: 0 .  Melatonin 5 MG CHEW, Chew by mouth as needed., Disp: , Rfl:  .  meloxicam (MOBIC) 15 MG tablet, Take 0.5 tablets (7.5 mg total) by mouth daily., Disp: 30 tablet, Rfl: 3 .  tiZANidine (ZANAFLEX) 4 MG tablet, Take 1 tablet (4 mg total) by mouth 3 (three) times daily., Disp: 270 tablet, Rfl: 1 .  UNABLE TO FIND, cbd, Disp: , Rfl:  .  Ascorbic Acid (VITAMIN C PO), Take by mouth daily., Disp: , Rfl:  .  Calcium Carb-Cholecalciferol (CALCIUM+D3) 600-800 MG-UNIT TABS, Take by mouth., Disp: , Rfl:  .  Cholecalciferol (VITAMIN D3) 2000 units TABS, Take by mouth., Disp: , Rfl:  .  diphenhydrAMINE HCl, Sleep, (UNISOM SLEEPGELS) 50 MG CAPS, Take 50 mg by mouth as needed., Disp: , Rfl:  .  Glucosamine-Chondroit-Vit C-Mn (GLUCOSAMINE CHONDR 1500 COMPLX PO), Take by mouth.,  Disp: , Rfl:  .  Multiple Vitamin (MULTIVITAMIN) tablet, Take 1 tablet by mouth daily., Disp: , Rfl:  .  Probiotic Product (PROBIOTIC COLON SUPPORT PO), Take by mouth., Disp: , Rfl:   Review of Systems  Constitutional: Negative for irritability.  Respiratory: Negative for shortness of breath.   Cardiovascular: Negative for chest pain and palpitations.  Gastrointestinal: Negative for nausea.  Genitourinary: Negative for impotence.  Neurological: Negative for dizziness.   Psychiatric/Behavioral: Negative for confusion, decreased concentration and suicidal ideas. The patient has insomnia. The patient is not nervous/anxious.    Social History   Tobacco Use  . Smoking status: Never Smoker  . Smokeless tobacco: Never Used  . Tobacco comment: cbd    Substance Use Topics  . Alcohol use: Not Currently    Comment: last used 12-22-17     Objective:   BP 138/70   Pulse 87   Temp (!) 97.5 F (36.4 C) (Oral)   Resp 16   Wt 235 lb (106.6 kg)   SpO2 96%   BMI 31.00 kg/m  Vitals:   12/14/18 1512  BP: 138/70  Pulse: 87  Resp: 16  Temp: (!) 97.5 F (36.4 C)  TempSrc: Oral  SpO2: 96%  Weight: 235 lb (106.6 kg)   Physical Exam Constitutional:      General: He is not in acute distress.    Appearance: He is well-developed.  HENT:     Head: Normocephalic and atraumatic.     Right Ear: Hearing normal.     Left Ear: Hearing normal.     Nose: Nose normal.  Eyes:     General: Lids are normal. No scleral icterus.       Right eye: No discharge.        Left eye: No discharge.     Conjunctiva/sclera: Conjunctivae normal.  Neck:     Musculoskeletal: Neck supple.  Cardiovascular:     Rate and Rhythm: Normal rate and regular rhythm.     Pulses: Normal pulses.     Heart sounds: Normal heart sounds.  Pulmonary:     Effort: Pulmonary effort is normal. No respiratory distress.  Abdominal:     General: Bowel sounds are normal.     Palpations: Abdomen is soft.  Musculoskeletal:        General: Tenderness present.     Comments: Mid to low back tenderness. No mottling of skin. Some spasms with sitting for long periods and certain movements. No muscle weakness today. No numbness. Ambulating better today with stiffness in back. SLR's up to 90 degrees with some right sciatic discomfort.   Skin:    Findings: No lesion or rash.  Neurological:     Mental Status: He is alert and oriented to person, place, and time.  Psychiatric:        Speech: Speech normal.         Behavior: Behavior normal.        Thought Content: Thought content normal.       Assessment & Plan    1. Chronic 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 Feels medications are controlling chronic pain syndrome well. Mobic helps with inflammation soreness, Norco 7.5-325 mg one tablet BID lowers acute pains from neurogenic claudication and severe degenerative disc disease. Has not been using marijuana or any street drugs. Clonazepam and Tizanidine helps control back spasms. Will recheck drug screen and maintain current regimen. Schedule follow up in 3 months for CPE. - Drug Screen 12+Alcohol+CRT, Ur  2. Anxiety  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 Feeling stable on the Clonazepam 1 mg BID. No hallucinations and very lucid today. No significant panic attacks recently. Living away from his family with two male room mates and has a "girlfriend" visit him once a month for a week. Recommend scheduling for annual physical in 3 months.     Dortha Kernennis Aaralyn Kil, PA  [facility]

## 2018-12-17 ENCOUNTER — Ambulatory Visit: Payer: Self-pay | Admitting: Family Medicine

## 2018-12-18 LAB — DRUG SCREEN 12+ALCOHOL+CRT, UR
Amphetamines, Urine: NEGATIVE ng/mL
BENZODIAZ UR QL: POSITIVE ng/mL — AB
Barbiturate: NEGATIVE ng/mL
Cannabinoids: NEGATIVE ng/mL
Cocaine (Metabolite): NEGATIVE ng/mL
Creatinine, Urine: 106.9 mg/dL (ref 20.0–300.0)
Ethanol, Urine: NEGATIVE %
Meperidine: NEGATIVE ng/mL
Methadone: NEGATIVE ng/mL
OPIATE SCREEN URINE: POSITIVE ng/mL — AB
Oxycodone/Oxymorphone, Urine: NEGATIVE ng/mL
Phencyclidine: NEGATIVE ng/mL
Propoxyphene: NEGATIVE ng/mL
Tramadol: NEGATIVE ng/mL

## 2018-12-18 LAB — SPECIMEN STATUS REPORT

## 2018-12-21 ENCOUNTER — Telehealth: Payer: Self-pay

## 2018-12-21 NOTE — Telephone Encounter (Signed)
Patient advised as below.  

## 2018-12-21 NOTE — Telephone Encounter (Signed)
-----   Message from Margo Common, [place] sent at 12/18/2018 12:42 PM EDT ----- Drug screen shows appropriate report according to medications taking. Keep follow up in December 2020 as planned.Marland Kitchen

## 2019-01-01 ENCOUNTER — Other Ambulatory Visit: Payer: Self-pay

## 2019-01-01 DIAGNOSIS — F410 Panic disorder [episodic paroxysmal anxiety] without agoraphobia: Secondary | ICD-10-CM

## 2019-01-01 DIAGNOSIS — F064 Anxiety disorder due to known physiological condition: Secondary | ICD-10-CM

## 2019-01-01 DIAGNOSIS — S32010K Wedge compression fracture of first lumbar vertebra, subsequent encounter for fracture with nonunion: Secondary | ICD-10-CM

## 2019-01-01 DIAGNOSIS — G8929 Other chronic pain: Secondary | ICD-10-CM

## 2019-01-01 DIAGNOSIS — M48062 Spinal stenosis, lumbar region with neurogenic claudication: Secondary | ICD-10-CM

## 2019-01-01 DIAGNOSIS — M5441 Lumbago with sciatica, right side: Secondary | ICD-10-CM

## 2019-01-04 MED ORDER — HYDROCODONE-ACETAMINOPHEN 7.5-325 MG PO TABS: 1.0000 | ORAL_TABLET | Freq: Two times a day (BID) | ORAL | 0 refills | Status: DC | PRN

## 2019-01-04 MED ORDER — CLONAZEPAM 1 MG PO TABS: 1.0000 mg | ORAL_TABLET | Freq: Two times a day (BID) | ORAL | 0 refills | Status: DC | PRN

## 2019-01-12 DIAGNOSIS — F419 Anxiety disorder, unspecified: Secondary | ICD-10-CM | POA: Diagnosis not present

## 2019-02-01 ENCOUNTER — Other Ambulatory Visit: Payer: Self-pay | Admitting: Family Medicine

## 2019-02-01 DIAGNOSIS — M5441 Lumbago with sciatica, right side: Secondary | ICD-10-CM

## 2019-02-01 DIAGNOSIS — F064 Anxiety disorder due to known physiological condition: Secondary | ICD-10-CM

## 2019-02-01 DIAGNOSIS — M48062 Spinal stenosis, lumbar region with neurogenic claudication: Secondary | ICD-10-CM

## 2019-02-01 DIAGNOSIS — S32010K Wedge compression fracture of first lumbar vertebra, subsequent encounter for fracture with nonunion: Secondary | ICD-10-CM

## 2019-02-01 DIAGNOSIS — F410 Panic disorder [episodic paroxysmal anxiety] without agoraphobia: Secondary | ICD-10-CM

## 2019-02-01 DIAGNOSIS — G8929 Other chronic pain: Secondary | ICD-10-CM

## 2019-02-01 MED ORDER — HYDROCODONE-ACETAMINOPHEN 7.5-325 MG PO TABS: 1.0000 | ORAL_TABLET | Freq: Two times a day (BID) | ORAL | 0 refills | Status: DC | PRN

## 2019-02-01 MED ORDER — CLONAZEPAM 1 MG PO TABS: 1.0000 mg | ORAL_TABLET | Freq: Two times a day (BID) | ORAL | 0 refills | Status: DC | PRN

## 2019-02-01 NOTE — Telephone Encounter (Signed)
Please needing refills on: clonazePAM (KLONOPIN) 1 MG tablet HYDROcodone-acetaminophen (NORCO) 7.5-325 MG tablet   Please refill at:  [id number] [address], [place] [phone] (Phone) [phone] (Fax)   Thanks, [place]

## 2019-02-02 DIAGNOSIS — F419 Anxiety disorder, unspecified: Secondary | ICD-10-CM | POA: Diagnosis not present

## 2019-02-23 DIAGNOSIS — F29 Unspecified psychosis not due to a substance or known physiological condition: Secondary | ICD-10-CM | POA: Diagnosis not present

## 2019-03-03 ENCOUNTER — Other Ambulatory Visit: Payer: Self-pay

## 2019-03-03 DIAGNOSIS — G8929 Other chronic pain: Secondary | ICD-10-CM

## 2019-03-03 DIAGNOSIS — M5441 Lumbago with sciatica, right side: Secondary | ICD-10-CM

## 2019-03-03 DIAGNOSIS — S32010K Wedge compression fracture of first lumbar vertebra, subsequent encounter for fracture with nonunion: Secondary | ICD-10-CM

## 2019-03-03 DIAGNOSIS — M48062 Spinal stenosis, lumbar region with neurogenic claudication: Secondary | ICD-10-CM

## 2019-03-03 DIAGNOSIS — F410 Panic disorder [episodic paroxysmal anxiety] without agoraphobia: Secondary | ICD-10-CM

## 2019-03-03 DIAGNOSIS — F064 Anxiety disorder due to known physiological condition: Secondary | ICD-10-CM

## 2019-03-04 MED ORDER — MELOXICAM 15 MG PO TABS: 7.5000 mg | ORAL_TABLET | Freq: Every day | ORAL | 3 refills | Status: DC

## 2019-03-04 MED ORDER — HYDROCODONE-ACETAMINOPHEN 7.5-325 MG PO TABS: 1.0000 | ORAL_TABLET | Freq: Two times a day (BID) | ORAL | 0 refills | Status: DC | PRN

## 2019-03-04 MED ORDER — CLONAZEPAM 1 MG PO TABS: 1.0000 mg | ORAL_TABLET | Freq: Two times a day (BID) | ORAL | 0 refills | Status: DC | PRN

## 2019-03-05 ENCOUNTER — Telehealth: Payer: Self-pay | Admitting: Family Medicine

## 2019-03-05 DIAGNOSIS — M5441 Lumbago with sciatica, right side: Secondary | ICD-10-CM

## 2019-03-05 DIAGNOSIS — G8929 Other chronic pain: Secondary | ICD-10-CM

## 2019-03-05 DIAGNOSIS — M48062 Spinal stenosis, lumbar region with neurogenic claudication: Secondary | ICD-10-CM

## 2019-03-05 NOTE — Telephone Encounter (Signed)
Pt is almost out of meloxicam (MOBIC) 15 MG tablet His Rx bottle says 1/2 a tab per day.  Since April pt has been taking 1/2 of the 15 mg pill.  1/2 in morning and 1/2 at night  Please call into:  [facility], [place] [phone] (Phone) [phone] (Fax)   Thanks, [employer]

## 2019-03-08 NOTE — Telephone Encounter (Signed)
Please review Rx.  Patient states he is taking it different thant on Rx.

## 2019-03-15 NOTE — Telephone Encounter (Signed)
Pt is calling and he take 1/2 meloxicam in morning and 1/2 at night for total of 15 mg  . Pt is out of medication

## 2019-03-15 NOTE — Telephone Encounter (Signed)
Was refilled on 03-04-19 at [facility] in [place] with 3 refills.

## 2019-03-16 ENCOUNTER — Telehealth: Payer: Self-pay

## 2019-03-16 ENCOUNTER — Other Ambulatory Visit: Payer: Self-pay | Admitting: Family Medicine

## 2019-03-16 ENCOUNTER — Other Ambulatory Visit: Payer: Self-pay

## 2019-03-16 DIAGNOSIS — M5441 Lumbago with sciatica, right side: Secondary | ICD-10-CM

## 2019-03-16 DIAGNOSIS — M48062 Spinal stenosis, lumbar region with neurogenic claudication: Secondary | ICD-10-CM

## 2019-03-16 DIAGNOSIS — G8929 Other chronic pain: Secondary | ICD-10-CM

## 2019-03-16 MED ORDER — MELOXICAM 15 MG PO TABS: 7.5000 mg | ORAL_TABLET | Freq: Two times a day (BID) | ORAL | 3 refills | Status: DC

## 2019-03-16 NOTE — Telephone Encounter (Signed)
Pt called back in, he says that he was told by pharmacy that he doesn't have a refill on file. Pt says that the Rx is written incorrectly. Pt says that he is suppose to take a half a tablet twice a day which would total 1 whole pill a day. Pt says that he is completely out and he can feel the inflammation building up in his spine. Pt says that Rx direction was incorrect last month also. Pt says that pharmacy is waiting for an updated Rx from PCP.   Please assist.

## 2019-03-16 NOTE — Telephone Encounter (Signed)
David Reeves please review and advise on the directions of use noted in the first part of the message

## 2019-03-16 NOTE — Telephone Encounter (Signed)
Patient states he has been taking Meloxicam 15mg   1/2 BID.  Rx in chart reads 1/2 q d.    Patient wants it increased. Please advise

## 2019-03-16 NOTE — Telephone Encounter (Signed)
Sent corrected prescription to [facility] earlier today. If needing some at the [facility], can send in to them for a 1 month supply to carry him until the [place] supply comes.

## 2019-03-23 DIAGNOSIS — F29 Unspecified psychosis not due to a substance or known physiological condition: Secondary | ICD-10-CM | POA: Diagnosis not present

## 2019-04-05 ENCOUNTER — Other Ambulatory Visit: Payer: Self-pay

## 2019-04-05 ENCOUNTER — Encounter: Payer: Self-pay | Admitting: Family Medicine

## 2019-04-05 ENCOUNTER — Ambulatory Visit (INDEPENDENT_AMBULATORY_CARE_PROVIDER_SITE_OTHER): Payer: Medicare HMO | Admitting: Family Medicine

## 2019-04-05 VITALS — BP 130/86 | HR 90 | Temp 96.9°F | Ht 73.0 in | Wt 235.0 lb

## 2019-04-05 DIAGNOSIS — F410 Panic disorder [episodic paroxysmal anxiety] without agoraphobia: Secondary | ICD-10-CM | POA: Diagnosis not present

## 2019-04-05 DIAGNOSIS — M48062 Spinal stenosis, lumbar region with neurogenic claudication: Secondary | ICD-10-CM

## 2019-04-05 DIAGNOSIS — Z125 Encounter for screening for malignant neoplasm of prostate: Secondary | ICD-10-CM | POA: Diagnosis not present

## 2019-04-05 DIAGNOSIS — M5441 Lumbago with sciatica, right side: Secondary | ICD-10-CM

## 2019-04-05 DIAGNOSIS — Z0000 Encounter for general adult medical examination without abnormal findings: Secondary | ICD-10-CM

## 2019-04-05 DIAGNOSIS — S32010K Wedge compression fracture of first lumbar vertebra, subsequent encounter for fracture with nonunion: Secondary | ICD-10-CM

## 2019-04-05 DIAGNOSIS — F064 Anxiety disorder due to known physiological condition: Secondary | ICD-10-CM

## 2019-04-05 DIAGNOSIS — F209 Schizophrenia, unspecified: Secondary | ICD-10-CM | POA: Diagnosis not present

## 2019-04-05 DIAGNOSIS — G8929 Other chronic pain: Secondary | ICD-10-CM

## 2019-04-05 MED ORDER — HYDROCODONE-ACETAMINOPHEN 7.5-325 MG PO TABS: 1.0000 | ORAL_TABLET | Freq: Two times a day (BID) | ORAL | 0 refills | Status: DC | PRN

## 2019-04-05 MED ORDER — CLONAZEPAM 1 MG PO TABS: 1.0000 mg | ORAL_TABLET | Freq: Two times a day (BID) | ORAL | 0 refills | Status: DC | PRN

## 2019-04-05 NOTE — Progress Notes (Signed)
Patient: David Reeves, Male    DOB: 1970-10-08, 48 y.o.   MRN: [id number] Visit Date: 04/05/2019  Today's Provider: Dortha Kernennis Scherry Laverne, PA   Chief Complaint  Patient presents with  . Annual Exam   Subjective:  David Reeves is a 48 y.o. male who presents today for health maintenance and complete physical. He feels well. He reports exercising daily. He reports he is sleeping poorly.  Immunization History  Administered Date(s) Administered  . Influenza,inj,Quad PF,6+ Mos 03/24/2014  . Influenza-Unspecified 03/24/2014  . Tdap 12/14/2009   Review of Systems  Constitutional: Negative.   HENT: Negative.   Eyes: Negative.   Respiratory: Positive for wheezing.        When exposed to room mates heavy smoking. They smoke more indoors with the colder weather.  Cardiovascular: Negative.   Gastrointestinal: Negative.   Endocrine: Negative.   Genitourinary: Negative.   Musculoskeletal: Positive for back pain.  Skin: Negative.   Allergic/Immunologic: Negative.   Neurological: Negative.   Hematological: Negative.   Psychiatric/Behavioral: The patient is nervous/anxious.     Social History   Socioeconomic History  . Marital status: Married    Spouse name: mikki   . Number of children: 1  . Years of education: Not on file  . Highest education level: Some college, no degree  Occupational History  . Not on file  Tobacco Use  . Smoking status: Never Smoker  . Smokeless tobacco: Never Used  . Tobacco comment: cbd    Substance and Sexual Activity  . Alcohol use: Not Currently    Comment: last used 12-22-17  . Drug use: Not Currently    Types: Marijuana    Comment: October 14, 2017  . Sexual activity: Not Currently  Other Topics Concern  . Not on file  Social History Narrative  . Not on file   Social Determinants of Health   Financial Resource Strain:   . Difficulty of Paying Living Expenses: Not on file  Food Insecurity:   . Worried About Programme researcher, broadcasting/film/videounning Out of Food in the  Last Year: Not on file  . Ran Out of Food in the Last Year: Not on file  Transportation Needs:   . Lack of Transportation (Medical): Not on file  . Lack of Transportation (Non-Medical): Not on file  Physical Activity:   . Days of Exercise per Week: Not on file  . Minutes of Exercise per Session: Not on file  Stress:   . Feeling of Stress : Not on file  Social Connections:   . Frequency of Communication with Friends and Family: Not on file  . Frequency of Social Gatherings with Friends and Family: Not on file  . Attends Religious Services: Not on file  . Active Member of Clubs or Organizations: Not on file  . Attends BankerClub or Organization Meetings: Not on file  . Marital Status: Not on file  Intimate Partner Violence:   . Fear of Current or Ex-Partner: Not on file  . Emotionally Abused: Not on file  . Physically Abused: Not on file  . Sexually Abused: Not on file    Patient Active Problem List   Diagnosis Date Noted  . Schizophrenia (HCC) 05/14/2017  . Laceration of right hand without foreign body   . Cannabis use disorder, moderate, dependence (HCC) 05/09/2017  . Closed compression fracture of L1 lumbar vertebra 05/24/2016  . Lumbar stenosis with neurogenic claudication 11/01/2015  . Chronic 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 11/08/2014  . Hx of hemorrhoids 11/08/2014  .  AAA (abdominal aortic aneurysm) ([place]) 09/22/2014  . Chronic pain associated with significant psychosocial dysfunction 09/22/2014  . Panic attack 09/22/2014  . AB (asthmatic bronchitis) 08/17/2014  . Anxiety 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 08/17/2014  . Backache 08/17/2014  . Lumbosacral spondylosis without myelopathy 08/17/2014  . 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 Brash 08/17/2014  . Low back pain 08/17/2014  . Tendon nodule 08/17/2014  . Episodic paroxysmal anxiety disorder 08/17/2014  . Hernia, inguinal, right 08/17/2014  . Fast heart beat 08/17/2014  . Illness  08/17/2014  . Inguinal hernia 10/14/2012    Past Surgical History:  Procedure Laterality Date  . CYSTECTOMY     head  . HERNIA REPAIR Left 2006,2014   [facility]  . TOE SURGERY Right 2007    His family history includes Diabetes in his maternal grandmother, maternal uncle, and mother; Heart disease in his maternal grandmother.     Outpatient Encounter Medications as of 04/05/2019  Medication Sig  . clonazePAM (KLONOPIN) 1 MG tablet Take 1 tablet (1 mg total) by mouth 2 (two) times daily as needed. for anxiety  . diphenhydrAMINE HCl, Sleep, (UNISOM SLEEPGELS) 50 MG CAPS Take 50 mg by mouth as needed.  Marland Kitchen HYDROcodone-acetaminophen (NORCO) 7.5-325 MG tablet Take 1 tablet by mouth 2 (two) times daily as needed.  . Melatonin 5 MG CHEW Chew by mouth as needed.  . meloxicam (MOBIC) 15 MG tablet Take 0.5 tablets (7.5 mg total) by mouth 2 (two) times daily.  Marland Kitchen tiZANidine (ZANAFLEX) 4 MG tablet TAKE 1 TABLET BY MOUTH THREE TIMES DAILY  . Ascorbic Acid (VITAMIN C PO) Take by mouth daily.  . Calcium Carb-Cholecalciferol (CALCIUM+D3) 600-800 MG-UNIT TABS Take by mouth.  . Cholecalciferol (VITAMIN D3) 2000 units TABS Take by mouth.  . Glucosamine-Chondroit-Vit C-Mn (GLUCOSAMINE CHONDR 1500 COMPLX PO) Take by mouth.  . Multiple Vitamin (MULTIVITAMIN) tablet Take 1 tablet by mouth daily.  . Probiotic Product (PROBIOTIC COLON SUPPORT PO) Take by mouth.  Marland Kitchen UNABLE TO FIND cbd   No facility-administered encounter medications on file as of 04/05/2019.    Patient Care Team: Shakeel Disney, Vickki Muff, PA as PCP - General (Family Medicine) Christene Lye, MD (General Surgery)      Objective:   Vitals:  Vitals:   04/05/19 1107  BP: (!) 146/102  Pulse: (!) 111  Temp: (!) 96.9 F (36.1 C)  TempSrc: Skin  SpO2: 97%  Weight: 235 lb (106.6 kg)  Height: 6\' 1"  (1.854 m)   Vitals:   04/05/19 1107 04/05/19 1115  BP: (!) 146/102 140/88  Pulse: (!) 111   Temp: (!) 96.9 F (36.1 C)   TempSrc: Skin    SpO2: 97%   Weight: 235 lb (106.6 kg)   Height: 6\' 1"  (1.854 m)    Wt Readings from Last 3 Encounters:  04/05/19 235 lb (106.6 kg)  12/14/18 235 lb (106.6 kg)  09/10/18 233 lb (105.7 kg)    Physical Exam Constitutional:      Appearance: He is well-developed.  HENT:     Head: Normocephalic and atraumatic.     Right Ear: External ear normal.     Left Ear: External ear normal.     Nose: Nose normal.  Eyes:     General:        Right eye: No discharge.     Conjunctiva/sclera: Conjunctivae normal.     Pupils: Pupils are equal, round, and reactive to light.  Neck:     Thyroid: No thyromegaly.  Trachea: No tracheal deviation.  Cardiovascular:     Rate and Rhythm: Regular rhythm. Tachycardia present.     Heart sounds: Normal heart sounds. No murmur.  Pulmonary:     Effort: Pulmonary effort is normal. No respiratory distress.     Breath sounds: Normal breath sounds. No wheezing or rales.  Chest:     Chest wall: No tenderness.  Abdominal:     General: There is no distension.     Palpations: Abdomen is soft. There is no mass.     Tenderness: There is no abdominal tenderness. There is no guarding or rebound.  Genitourinary:    Penis: Normal.      Testes: Normal.     Prostate: Normal.     Rectum: Normal.  Musculoskeletal:        General: Tenderness present. Normal range of motion.     Cervical back: Normal range of motion and neck supple.     Comments: Mid to low back tenderness. No mottling of skin. Some spasms with sitting for long periods and certain movements. No muscle weakness today. No numbness. Ambulating better today with stiffness in back. SLR's up to 90 degrees with some slight right sciatic discomfort.   Lymphadenopathy:     Cervical: No cervical adenopathy.  Skin:    General: Skin is warm and dry.     Findings: No erythema or rash.  Neurological:     Mental Status: He is alert and oriented to person, place, and time.     Cranial Nerves: No cranial nerve  deficit.     Motor: No abnormal muscle tone.     Coordination: Coordination normal.  Psychiatric:        Behavior: Behavior normal.        Thought Content: Thought content normal.        Judgment: Judgment normal.    Depression Screen PHQ 2/9 Scores 09/10/2018 01/02/2017  PHQ - 2 Score 0 2  PHQ- 9 Score - 7    Assessment & Plan:     Routine Health Maintenance and Physical Exam  Exercise Activities and Dietary recommendations Goals   Walk for 30-40 minutes in his driveway each morning. Try to exercise in a pool and then get into a hot tub.     Immunization History  Administered Date(s) Administered  . Influenza,inj,Quad PF,6+ Mos 03/24/2014  . Influenza-Unspecified 03/24/2014  . Tdap 12/14/2009    Health Maintenance  Topic Date Due  . HIV Screening  01/13/1985  . INFLUENZA VACCINE  11/21/2018  . TETANUS/TDAP  12/15/2019     Discussed health benefits of physical activity, and encouraged him to engage in regular exercise appropriate for his age and condition.   1. Annual physical exam General health stable. Needs flu shot but will get it when he comes by fasting for routine labs. Given COVID counseling. - CBC with Differential/Platelet - Comprehensive metabolic panel - Lipid Panel With LDL/HDL Ratio - TSH  2. Prostate cancer screening DRE normal. Denies urinary symptoms. - PSA  3. Lumbar stenosis with neurogenic claudication Having pain everyday but less intense. No longer using the hot heating pad and no mottling of skin now in the lower back. Pain well controlled on the Norco 7.5-325 mg 1 tablet BID, Meloxicam 15 mg 1/2 tablet BID and Zanaflex 4 mg TID prn spasms. Refill Norco and schedule for drug screen tomorrow during follow up blood draw. - HYDROcodone-acetaminophen (NORCO) 7.5-325 MG tablet; Take 1 tablet by mouth 2 (two) times daily as needed.  Dispense: 60 tablet; Refill: 0  4. Panic attack Having less extreme panic. Feels calmer with plans to marry his  girlfriend in June or July 2021. She works as a Electrical engineer at [employer]. Will get drug screen, refill the Clonazepam 1 mg BID prn panic and sleep disturbance. Follow up pending lab reports. - CBC with Differential/Platelet - Comprehensive metabolic panel - clonazePAM (KLONOPIN) 1 MG tablet; Take 1 tablet (1 mg total) by mouth 2 (two) times daily as needed. for anxiety  Dispense: 60 tablet; Refill: 0 - Drug Screen 12+Alcohol+CRT, Ur; Future  5. 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 Continue Norco prn pain BID. Fair ROM with tightness to bend over.  6. Chronic 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 DTR's symmetric in lower extremities but diminished. Denies numbness or muscle weakness. Pain more intense when he stands for prolonged periods of time or walking long distances. Encouraged to proceed with rehab stretching exercises. - CBC with Differential/Platelet - Comprehensive metabolic panel - HYDROcodone-acetaminophen (NORCO) 7.5-325 MG tablet; Take 1 tablet by mouth 2 (two) times daily as needed.  Dispense: 60 tablet; Refill: 0 - Drug Screen 12+Alcohol+CRT, Ur; Future  7. Anxiety 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 Intermittent anxiety each day without hallucinations or delusions. Less panic attacks with use of the Clonazepam 1 mg BID. Difficulty getting to sleep but sleep well once he does. May use Melatonin and/or antihistamine. Recheck routine follow up labs. - CBC with Differential/Platelet - Comprehensive metabolic panel - TSH

## 2019-04-06 ENCOUNTER — Ambulatory Visit: Payer: Self-pay | Admitting: Family Medicine

## 2019-04-06 ENCOUNTER — Ambulatory Visit: Payer: Self-pay | Admitting: Physician Assistant

## 2019-04-07 DIAGNOSIS — Z125 Encounter for screening for malignant neoplasm of prostate: Secondary | ICD-10-CM | POA: Diagnosis not present

## 2019-04-07 DIAGNOSIS — Z0000 Encounter for general adult medical examination without abnormal findings: Secondary | ICD-10-CM | POA: Diagnosis not present

## 2019-04-07 DIAGNOSIS — M48062 Spinal stenosis, lumbar region with neurogenic claudication: Secondary | ICD-10-CM | POA: Diagnosis not present

## 2019-04-08 LAB — CBC WITH DIFFERENTIAL/PLATELET
Basophils Absolute: 0.1 10*3/uL (ref 0.0–0.2)
Basos: 1 %
EOS (ABSOLUTE): 0.2 10*3/uL (ref 0.0–0.4)
Eos: 3 %
Hematocrit: 41.2 % (ref 37.5–51.0)
Hemoglobin: 14.1 g/dL (ref 13.0–17.7)
Immature Grans (Abs): 0 10*3/uL (ref 0.0–0.1)
Immature Granulocytes: 0 %
Lymphocytes Absolute: 1.6 10*3/uL (ref 0.7–3.1)
Lymphs: 23 %
MCH: 33.7 pg — ABNORMAL HIGH (ref 26.6–33.0)
MCHC: 34.2 g/dL (ref 31.5–35.7)
MCV: 98 fL — ABNORMAL HIGH (ref 79–97)
Monocytes Absolute: 0.5 10*3/uL (ref 0.1–0.9)
Monocytes: 8 %
Neutrophils Absolute: 4.6 10*3/uL (ref 1.4–7.0)
Neutrophils: 65 %
Platelets: 277 10*3/uL (ref 150–450)
RBC: 4.19 x10E6/uL (ref 4.14–5.80)
RDW: 12.3 % (ref 11.6–15.4)
WBC: 7 10*3/uL (ref 3.4–10.8)

## 2019-04-08 LAB — COMPREHENSIVE METABOLIC PANEL
ALT: 24 IU/L (ref 0–44)
AST: 14 IU/L (ref 0–40)
Albumin/Globulin Ratio: 1.7 (ref 1.2–2.2)
Albumin: 3.9 g/dL — ABNORMAL LOW (ref 4.0–5.0)
Alkaline Phosphatase: 56 IU/L (ref 39–117)
BUN/Creatinine Ratio: 15 (ref 9–20)
BUN: 13 mg/dL (ref 6–24)
Bilirubin Total: 0.4 mg/dL (ref 0.0–1.2)
CO2: 21 mmol/L (ref 20–29)
Calcium: 9.4 mg/dL (ref 8.7–10.2)
Chloride: 103 mmol/L (ref 96–106)
Creatinine, Ser: 0.87 mg/dL (ref 0.76–1.27)
GFR calc Af Amer: 117 mL/min/{1.73_m2} (ref >59)
GFR calc non Af Amer: 101 mL/min/{1.73_m2} (ref >59)
Globulin, Total: 2.3 g/dL (ref 1.5–4.5)
Glucose: 94 mg/dL (ref 65–99)
Potassium: 4.3 mmol/L (ref 3.5–5.2)
Sodium: 139 mmol/L (ref 134–144)
Total Protein: 6.2 g/dL (ref 6.0–8.5)

## 2019-04-08 LAB — LIPID PANEL WITH LDL/HDL RATIO
Cholesterol, Total: 177 mg/dL (ref 100–199)
HDL: 39 mg/dL — ABNORMAL LOW (ref >39)
LDL Chol Calc (NIH): 118 mg/dL — ABNORMAL HIGH (ref 0–99)
LDL/HDL Ratio: 3 ratio (ref 0.0–3.6)
Triglycerides: 110 mg/dL (ref 0–149)
VLDL Cholesterol Cal: 20 mg/dL (ref 5–40)

## 2019-04-08 LAB — PSA: Prostate Specific Ag, Serum: 1.8 ng/mL (ref 0.0–4.0)

## 2019-04-08 LAB — TSH: TSH: 1.28 u[IU]/mL (ref 0.450–4.500)

## 2019-04-20 DIAGNOSIS — F29 Unspecified psychosis not due to a substance or known physiological condition: Secondary | ICD-10-CM | POA: Diagnosis not present

## 2019-05-03 ENCOUNTER — Other Ambulatory Visit: Payer: Self-pay | Admitting: Family Medicine

## 2019-05-03 DIAGNOSIS — M48062 Spinal stenosis, lumbar region with neurogenic claudication: Secondary | ICD-10-CM

## 2019-05-03 DIAGNOSIS — G8929 Other chronic pain: Secondary | ICD-10-CM

## 2019-05-03 DIAGNOSIS — M5441 Lumbago with sciatica, right side: Secondary | ICD-10-CM

## 2019-05-03 DIAGNOSIS — F410 Panic disorder [episodic paroxysmal anxiety] without agoraphobia: Secondary | ICD-10-CM

## 2019-05-03 MED ORDER — TIZANIDINE HCL 4 MG PO TABS: 4.0000 mg | ORAL_TABLET | Freq: Three times a day (TID) | ORAL | 0 refills | Status: DC

## 2019-05-03 MED ORDER — CLONAZEPAM 1 MG PO TABS: 1.0000 mg | ORAL_TABLET | Freq: Two times a day (BID) | ORAL | 0 refills | Status: DC | PRN

## 2019-05-03 MED ORDER — HYDROCODONE-ACETAMINOPHEN 7.5-325 MG PO TABS: 1.0000 | ORAL_TABLET | Freq: Two times a day (BID) | ORAL | 0 refills | Status: DC | PRN

## 2019-05-03 NOTE — Telephone Encounter (Signed)
Requested medication (s) are due for refill today: Tizanidine, yes  Requested medication (s) are on the active medication list: yes  Last refill:  02/01/2019  Future visit scheduled: yes  Notes to clinic: Not delegated   Requested medication (s) are due for refill today: Klonopin, yes  Requested medication (s) are on the active medication list: yes  Last refill:  04/05/2019  Future visit scheduled: yes  Notes to clinic:  not delegated  Requested medication (s) are due for refill today: Norco 7.5-325, yes  Requested medication (s) are on the active medication list: yes  Last refill:  04/05/2019  Future visit scheduled: yes  Notes to clinic:  not delegted  Requested Prescriptions  Pending Prescriptions Disp Refills   tiZANidine (ZANAFLEX) 4 MG tablet 270 tablet 1    Sig: Take 1 tablet (4 mg total) by mouth 3 (three) times daily.      Not Delegated - Cardiovascular:  Alpha-2 Agonists - tizanidine Failed - 05/03/2019  1:59 PM      Failed - This refill cannot be delegated      Passed - Valid encounter within last 6 months    Recent Outpatient Visits           4 weeks ago Annual physical exam   [facility] Chrismon, [place] E, [place]   4 months ago Chronic 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   [facility], [place] E, [place]   7 months ago Chronic 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   [facility], Jodell Cipro, [place]   1 year ago Paranoid schizophrenia [facility])   [facility], [place] E, [place]   1 year ago Episodic paroxysmal anxiety disorder   [facility], [place]       Future Appointments             In 2 months Chrismon, Jodell Cipro, PA [facility]              clonazePAM (KLONOPIN) 1 MG tablet 60 tablet 0    Sig: Take 1 tablet (1 mg total) by mouth 2 (two) times daily as needed. for anxiety      Not Delegated -  Psychiatry:  Anxiolytics/Hypnotics Failed - 05/03/2019  1:59 PM      Failed - This refill cannot be delegated      Failed - Urine Drug Screen completed in last 360 days.      Passed - Valid encounter within last 6 months    Recent Outpatient Visits           4 weeks ago Annual physical exam   [facility] Chrismon, Jodell Cipro, [place]   4 months ago Chronic 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   [facility], [place] E, [place]   7 months ago Chronic 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   [facility], Jodell Cipro, [place]   1 year ago Paranoid schizophrenia [facility])   [facility], Jodell Cipro, [place]   1 year ago Episodic paroxysmal anxiety disorder   [facility], Demetrios Isaacs, [place]       Future Appointments             In 2 months Chrismon, Jodell Cipro, PA [facility]              HYDROcodone-acetaminophen (NORCO) 7.5-325 MG tablet 60 tablet 0  Sig: Take 1 tablet by mouth 2 (two) times daily as needed.      Not Delegated - Analgesics:  Opioid Agonist Combinations Failed - 05/03/2019  1:59 PM      Failed - This refill cannot be delegated      Failed - Urine Drug Screen completed in last 360 days.      Passed - Valid encounter within last 6 months    Recent Outpatient Visits           4 weeks ago Annual physical exam   [place], [place]   4 months ago Chronic 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   [place], [place]   7 months ago Chronic 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   [facility], Vickki Muff, [place]   1 year ago Paranoid schizophrenia [facility])   [facility], Vickki Muff, [place]   1 year ago Episodic paroxysmal anxiety disorder   [facility], Kirstie Peri, [place]       Future Appointments             In 2  months [place], Vickki Muff, [place], [place]

## 2019-05-03 NOTE — Telephone Encounter (Signed)
Copied from CRM [id number]. Topic: Quick Communication - Rx Refill/Question >> May 03, 2019  1:51 PM Mcneil, Jannifer Rodney wrote: Pt requests 90 day supply on the Rx for tiZANidine (ZANAFLEX) 4 MG tablet  Medication: tiZANidine (ZANAFLEX) 4 MG tablet, HYDROcodone-acetaminophen (NORCO) 7.5-325 MG tablet, and clonazePAM (KLONOPIN) 1 MG tablet  Has the patient contacted their pharmacy? yes   Preferred Pharmacy (with phone number or street name): [facility][id number] [name], [place] - [address] AT [facility] OF [address] [facility] Phone: [phone]   Fax: [phone]  Agent: Please be advised that RX refills may take up to 3 business days. We ask that you follow-up with your pharmacy.

## 2019-06-04 ENCOUNTER — Other Ambulatory Visit: Payer: Self-pay | Admitting: Family Medicine

## 2019-06-04 DIAGNOSIS — G8929 Other chronic pain: Secondary | ICD-10-CM

## 2019-06-04 DIAGNOSIS — M5441 Lumbago with sciatica, right side: Secondary | ICD-10-CM

## 2019-06-04 DIAGNOSIS — M48062 Spinal stenosis, lumbar region with neurogenic claudication: Secondary | ICD-10-CM

## 2019-06-04 DIAGNOSIS — F410 Panic disorder [episodic paroxysmal anxiety] without agoraphobia: Secondary | ICD-10-CM

## 2019-06-04 MED ORDER — CLONAZEPAM 1 MG PO TABS: 1.0000 mg | ORAL_TABLET | Freq: Two times a day (BID) | ORAL | 0 refills | Status: DC | PRN

## 2019-06-04 MED ORDER — HYDROCODONE-ACETAMINOPHEN 7.5-325 MG PO TABS: 1.0000 | ORAL_TABLET | Freq: Two times a day (BID) | ORAL | 0 refills | Status: DC | PRN

## 2019-06-04 NOTE — Telephone Encounter (Signed)
Requested medication (s) are due for refill today:yes  Requested medication (s) are on the active medication list: yes  Last refill: 05/03/2019  Future visit scheduled: yes  Notes to clinic: this refill cannot be delegated    Requested Prescriptions  Pending Prescriptions Disp Refills   HYDROcodone-acetaminophen (NORCO) 7.5-325 MG tablet 60 tablet 0    Sig: Take 1 tablet by mouth 2 (two) times daily as needed.      Not Delegated - Analgesics:  Opioid Agonist Combinations Failed - 06/04/2019  9:22 AM      Failed - This refill cannot be delegated      Failed - Urine Drug Screen completed in last 360 days.      Passed - Valid encounter within last 6 months    Recent Outpatient Visits           2 months ago Annual physical exam   [facility] Chrismon, Jodell Cipro, [place]   5 months ago Chronic 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   [facility], [place], [place]   8 months ago Chronic 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   [facility], Jodell Cipro, [place]   1 year ago Paranoid schizophrenia [facility])   [facility], Jodell Cipro, [place]   1 year ago Episodic paroxysmal anxiety disorder   [facility], Demetrios Isaacs, [place]       Future Appointments             In 1 month Chrismon, Jodell Cipro, [place] [facility]              clonazePAM (KLONOPIN) 1 MG tablet 60 tablet 0    Sig: Take 1 tablet (1 mg total) by mouth 2 (two) times daily as needed. for anxiety      Not Delegated - Psychiatry:  Anxiolytics/Hypnotics Failed - 06/04/2019  9:22 AM      Failed - This refill cannot be delegated      Failed - Urine Drug Screen completed in last 360 days.      Passed - Valid encounter within last 6 months    Recent Outpatient Visits           2 months ago Annual physical exam   [facility] Chrismon, Jodell Cipro, [place]   5 months ago Chronic bilateral low back  pain with right-sided sciatica   [facility], [place] E, [place]   8 months ago Chronic 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   [facility], Jodell Cipro, [place]   1 year ago Paranoid schizophrenia [facility])   [facility], Jodell Cipro, [place]   1 year ago Episodic paroxysmal anxiety disorder   [facility], Demetrios Isaacs, [place]       Future Appointments             In 1 month Chrismon, Jodell Cipro, [place] [facility]

## 2019-06-04 NOTE — Telephone Encounter (Signed)
Medication Refill - Medication:  HYDROcodone-acetaminophen (NORCO) 7.5-325 MG tablet clonazePAM (KLONOPIN) 1 MG tablet  Has the patient contacted their pharmacy? No - must call PCP (Agent: If no, request that the patient contact the pharmacy for the refill.) (Agent: If yes, when and what did the pharmacy advise?)  Preferred Pharmacy (with phone number or street name):  [facility] #[id number] [name], [place] - [address] AT [facility] OF [address] [facility] Phone:  [phone]  Fax:  [phone]     Agent: Please be advised that RX refills may take up to 3 business days. We ask that you follow-up with your pharmacy.

## 2019-07-02 ENCOUNTER — Other Ambulatory Visit: Payer: Self-pay | Admitting: Family Medicine

## 2019-07-02 DIAGNOSIS — M48062 Spinal stenosis, lumbar region with neurogenic claudication: Secondary | ICD-10-CM

## 2019-07-02 DIAGNOSIS — M5441 Lumbago with sciatica, right side: Secondary | ICD-10-CM

## 2019-07-02 DIAGNOSIS — F410 Panic disorder [episodic paroxysmal anxiety] without agoraphobia: Secondary | ICD-10-CM

## 2019-07-02 DIAGNOSIS — G8929 Other chronic pain: Secondary | ICD-10-CM

## 2019-07-02 MED ORDER — HYDROCODONE-ACETAMINOPHEN 7.5-325 MG PO TABS: 1.0000 | ORAL_TABLET | Freq: Two times a day (BID) | ORAL | 0 refills | Status: DC | PRN

## 2019-07-02 MED ORDER — CLONAZEPAM 1 MG PO TABS: 1.0000 mg | ORAL_TABLET | Freq: Two times a day (BID) | ORAL | 0 refills | Status: DC | PRN

## 2019-07-02 NOTE — Telephone Encounter (Signed)
Medication Refill - Medication:  HYDROcodone-acetaminophen (NORCO) 7.5-325 MG tablet [[id number]]  clonazePAM (KLONOPIN) 1 MG tablet [[id number]]     Has the patient contacted their pharmacy? No. (Agent: If no, request that the patient contact the pharmacy for the refill.) (Agent: If yes, when and what did the pharmacy advise?)  Preferred Pharmacy (with phone number or street name): [facility] on [address] [place]   Agent: Please be advised that RX refills may take up to 3 business days. We ask that you follow-up with your pharmacy.

## 2019-07-05 NOTE — Progress Notes (Signed)
David Reeves  MRN: [id number] DOB: 03/18/1971  Subjective:  HPI    Lipid/Cholesterol, Follow-up:   Last seen for this 3 months ago.  Management changes since that visit include no changes. . Last Lipid Panel:    Component Value Date/Time   CHOL 177 04/07/2019 1156   TRIG 110 04/07/2019 1156   HDL 39 (L) 04/07/2019 1156   CHOLHDL 4.3 05/10/2017 0626   VLDL 14 05/10/2017 0626   LDLCALC 118 (H) 04/07/2019 1156    Risk factors for vascular disease include none  He reports good compliance with treatment.  Patient is not on any medication for this problem   Wt Readings from Last 3 Encounters:  07/06/19 221 lb (100.2 kg)  04/05/19 235 lb (106.6 kg)  12/14/18 235 lb (106.6 kg)   Patient is also here to have follow up CBC.  History of abnormal CBC.  CBC Latest Ref Rng & Units 04/07/2019 01/05/2018 09/18/2017  WBC 3.4 - 10.8 x10E3/uL 7.0 4.5 5.3  Hemoglobin 13.0 - 17.7 g/dL 51.7 00.1 74.9  Hematocrit 37.5 - 51.0 % 41.2 41.0 44.2  Platelets 150 - 450 x10E3/uL 277 268 258    Patient Active Problem List   Diagnosis Date Noted  . Schizophrenia (HCC) 05/14/2017  . Laceration of right hand without foreign body   . Cannabis use disorder, moderate, dependence (HCC) 05/09/2017  . Closed compression fracture of L1 lumbar vertebra 05/24/2016  . Lumbar stenosis with neurogenic claudication 11/01/2015  . Chronic 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 11/08/2014  . Hx of hemorrhoids 11/08/2014  . AAA (abdominal aortic aneurysm) (HCC) 09/22/2014  . Chronic pain associated with significant psychosocial dysfunction 09/22/2014  . Panic attack 09/22/2014  . AB (asthmatic bronchitis) 08/17/2014  . Anxiety 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 08/17/2014  . Backache 08/17/2014  . Lumbosacral spondylosis without myelopathy 08/17/2014  . 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 Brash 08/17/2014  . Low back pain 08/17/2014  . Tendon nodule 08/17/2014  .  Episodic paroxysmal anxiety disorder 08/17/2014  . Hernia, inguinal, right 08/17/2014  . Fast heart beat 08/17/2014  . Illness 08/17/2014  . Inguinal hernia 10/14/2012    Past Medical History:  Diagnosis Date  . Back pain 2014  . Bone spur   . Bulging disc 2014  . Degenerative disc disease   . Osteoarthritis   . Panic anxiety syndrome   . Taking multiple medications for chronic disease    Past Surgical History:  Procedure Laterality Date  . CYSTECTOMY     head  . HERNIA REPAIR Left 2006,2014   [facility]  . TOE SURGERY Right 2007   Family History  Problem Relation Age of Onset  . Diabetes Mother   . Diabetes Maternal Uncle   . Diabetes Maternal Grandmother   . Heart disease Maternal Grandmother    Social History   Socioeconomic History  . Marital status: Married    Spouse name: mikki   . Number of children: 1  . Years of education: Not on file  . Highest education level: Some college, no degree  Occupational History  . Not on file  Tobacco Use  . Smoking status: Never Smoker  . Smokeless tobacco: Never Used  . Tobacco comment: cbd    Substance and Sexual Activity  . Alcohol use: Not Currently    Comment: last used 12-22-17  . Drug use: Not Currently    Types: Marijuana    Comment: October 14, 2017  . Sexual activity:  Not Currently  Other Topics Concern  . Not on file  Social History Narrative  . Not on file   Social Determinants of Health   Financial Resource Strain:   . Difficulty of Paying Living Expenses:   Food Insecurity:   . Worried About Charity fundraiser in the Last Year:   . Arboriculturist in the Last Year:   Transportation Needs:   . Film/video editor (Medical):   Marland Kitchen Lack of Transportation (Non-Medical):   Physical Activity:   . Days of Exercise per Week:   . Minutes of Exercise per Session:   Stress:   . Feeling of Stress :   Social Connections:   . Frequency of Communication with Friends and Family:   . Frequency of Social Gatherings  with Friends and Family:   . Attends Religious Services:   . Active Member of Clubs or Organizations:   . Attends Archivist Meetings:   Marland Kitchen Marital Status:   Intimate Partner Violence:   . Fear of Current or Ex-Partner:   . Emotionally Abused:   Marland Kitchen Physically Abused:   . Sexually Abused:     Outpatient Encounter Medications as of 07/06/2019  Medication Sig  . Cholecalciferol (VITAMIN D3) 2000 units TABS Take by mouth.  . clonazePAM (KLONOPIN) 1 MG tablet Take 1 tablet (1 mg total) by mouth 2 (two) times daily as needed. for anxiety  . HYDROcodone-acetaminophen (NORCO) 7.5-325 MG tablet Take 1 tablet by mouth 2 (two) times daily as needed.  . meloxicam (MOBIC) 15 MG tablet Take 0.5 tablets (7.5 mg total) by mouth 2 (two) times daily.  . Multiple Vitamin (MULTIVITAMIN) tablet Take 1 tablet by mouth daily.  Marland Kitchen tiZANidine (ZANAFLEX) 4 MG tablet Take 1 tablet (4 mg total) by mouth 3 (three) times daily.  Marland Kitchen UNABLE TO FIND cbd  . [DISCONTINUED] Ascorbic Acid (VITAMIN C PO) Take by mouth daily.  . [DISCONTINUED] Calcium Carb-Cholecalciferol (CALCIUM+D3) 600-800 MG-UNIT TABS Take by mouth.  . [DISCONTINUED] diphenhydrAMINE HCl, Sleep, (UNISOM SLEEPGELS) 50 MG CAPS Take 50 mg by mouth as needed.  . [DISCONTINUED] Glucosamine-Chondroit-Vit C-Mn (GLUCOSAMINE CHONDR 1500 COMPLX PO) Take by mouth.  . [DISCONTINUED] Melatonin 5 MG CHEW Chew by mouth as needed.  . [DISCONTINUED] Probiotic Product (PROBIOTIC COLON SUPPORT PO) Take by mouth.   No facility-administered encounter medications on file as of 07/06/2019.    Allergies  Allergen Reactions  . Duloxetine     Other reaction(s): Other (See Comments) Increased temperature, sweating, uncontrolled shaking, aggressive thoughts  . Amitriptyline Other (See Comments)    Urine retention  . Buspirone Other (See Comments)    Kidney pain  . Ciprofloxacin Other (See Comments)    hallucinations  . Doxepin Other (See Comments)    "Didn't feel  right"  . Escitalopram Other (See Comments)    Agitation  . Olanzapine   . Penicillins Other (See Comments)    Unknown -- childhood reaction  . Sertraline Other (See Comments)    Urine retention  . Trazodone Other (See Comments)    abd pain, urine retention    Review of Systems  Constitutional: Negative for chills, diaphoresis, fever and malaise/fatigue.  HENT: Negative for congestion, ear pain, sinus pain and sore throat.   Respiratory: Negative for cough, shortness of breath and wheezing.   Cardiovascular: Negative for chest pain and palpitations.  Gastrointestinal: Negative for abdominal pain and diarrhea.  Musculoskeletal: Positive for back pain. Negative for myalgias.  Neurological: Negative for dizziness and  headaches.  Endo/Heme/Allergies: Does not bruise/bleed easily.    Objective:  BP 122/84 (BP Location: Right Arm, Patient Position: Sitting, Cuff Size: Normal)   Pulse (!) 129   Temp (!) 96.8 F (36 C) (Skin)   Wt 221 lb (100.2 kg)   SpO2 94%   BMI 29.16 kg/m   Physical Exam  Constitutional: He is oriented to person, place, and time and well-developed, well-nourished, and in no distress.  HENT:  Head: Normocephalic.  Eyes: Conjunctivae are normal.  Cardiovascular: Normal rate.  Pulmonary/Chest: Effort normal and breath sounds normal.  Abdominal: Soft. Bowel sounds are normal.  Musculoskeletal:     Cervical back: Neck supple.     Comments: Stiffness and pain in lower back stable. No numbness or muscle weakness in lower extremities. Some radiation down the right sciatic nerve occasionally. No significant pain to test SLR's to 90 degrees today.  Neurological: He is alert and oriented to person, place, and time.  Skin: No rash noted.  Psychiatric: Mood, affect and judgment normal.    Assessment and Plan :   1. Elevated LDL cholesterol level LDL was 118 with HDL 39 and total cholesterol 177 on 04-07-19. Has lost 14 lbs in the past 3 months. Will check CMP and  Lipid Panel today. - Comprehensive metabolic panel - Lipid panel  2. Panic attack Only having significant anxiety attacks in crowds now. Living in [place] alone now. Still sees girlfriend regularly. Clonazepam 1 mg BID keeps anxiety level stable and takes Benadryl hs for sleep.. Recheck labs and drug screen. - CBC with Differential/Platelet - Drug Screen 12+Alcohol+CRT, Ur - Comprehensive metabolic panel  3. Chronic 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 Feels medications controlling chronic pain well. Mobic helps with inflammation soreness, Norco 7.5 -325 BID lower acute pain from neurogenic claudication and severe degenerative disc disease. No longer using marijuana or any street drugs. Clonazepam and Tizanidine controls spasms with tension of anxiety/panic disorder. Recheck CBC and Drug Screen. Follow up in 3 months. - CBC with Differential/Platelet - Drug Screen 12+Alcohol+CRT, Ur - Comprehensive metabolic panel  4. Chronic prescription opiate use Maintaining back pain at a 3-5/10 level with Norco 7.5-325 mg one tablet BID. Starting some waking exercise with warmer weather coming. Check drug screen CBC and CMP. - CBC with Differential/Platelet - Comprehensive metabolic panel - Drug Screen 12+Alcohol+CRT, Ur

## 2019-07-06 ENCOUNTER — Other Ambulatory Visit: Payer: Self-pay

## 2019-07-06 ENCOUNTER — Ambulatory Visit (INDEPENDENT_AMBULATORY_CARE_PROVIDER_SITE_OTHER): Payer: Medicare HMO | Admitting: Family Medicine

## 2019-07-06 ENCOUNTER — Encounter: Payer: Self-pay | Admitting: Family Medicine

## 2019-07-06 VITALS — BP 122/84 | HR 129 | Temp 96.8°F | Wt 221.0 lb

## 2019-07-06 DIAGNOSIS — M5441 Lumbago with sciatica, right side: Secondary | ICD-10-CM

## 2019-07-06 DIAGNOSIS — G8929 Other chronic pain: Secondary | ICD-10-CM | POA: Diagnosis not present

## 2019-07-06 DIAGNOSIS — F410 Panic disorder [episodic paroxysmal anxiety] without agoraphobia: Secondary | ICD-10-CM | POA: Diagnosis not present

## 2019-07-06 DIAGNOSIS — F209 Schizophrenia, unspecified: Secondary | ICD-10-CM | POA: Diagnosis not present

## 2019-07-06 DIAGNOSIS — E7800 Pure hypercholesterolemia, unspecified: Secondary | ICD-10-CM

## 2019-07-06 DIAGNOSIS — Z79891 Long term (current) use of opiate analgesic: Secondary | ICD-10-CM | POA: Diagnosis not present

## 2019-07-07 LAB — CBC WITH DIFFERENTIAL/PLATELET
Basophils Absolute: 0 10*3/uL (ref 0.0–0.2)
Basos: 0 %
EOS (ABSOLUTE): 0 10*3/uL (ref 0.0–0.4)
Eos: 0 %
Hematocrit: 51.1 % — ABNORMAL HIGH (ref 37.5–51.0)
Hemoglobin: 17.2 g/dL (ref 13.0–17.7)
Immature Grans (Abs): 0 10*3/uL (ref 0.0–0.1)
Immature Granulocytes: 0 %
Lymphocytes Absolute: 2.2 10*3/uL (ref 0.7–3.1)
Lymphs: 20 %
MCH: 31.4 pg (ref 26.6–33.0)
MCHC: 33.7 g/dL (ref 31.5–35.7)
MCV: 93 fL (ref 79–97)
Monocytes Absolute: 0.6 10*3/uL (ref 0.1–0.9)
Monocytes: 5 %
Neutrophils Absolute: 8 10*3/uL — ABNORMAL HIGH (ref 1.4–7.0)
Neutrophils: 75 %
Platelets: 386 10*3/uL (ref 150–450)
RBC: 5.48 x10E6/uL (ref 4.14–5.80)
RDW: 12.1 % (ref 11.6–15.4)
WBC: 10.8 10*3/uL (ref 3.4–10.8)

## 2019-07-07 LAB — LIPID PANEL
Chol/HDL Ratio: 5.2 ratio — ABNORMAL HIGH (ref 0.0–5.0)
Cholesterol, Total: 230 mg/dL — ABNORMAL HIGH (ref 100–199)
HDL: 44 mg/dL (ref >39)
LDL Chol Calc (NIH): 164 mg/dL — ABNORMAL HIGH (ref 0–99)
Triglycerides: 124 mg/dL (ref 0–149)
VLDL Cholesterol Cal: 22 mg/dL (ref 5–40)

## 2019-07-07 LAB — COMPREHENSIVE METABOLIC PANEL
ALT: 44 IU/L (ref 0–44)
AST: 23 IU/L (ref 0–40)
Albumin/Globulin Ratio: 1.6 (ref 1.2–2.2)
Albumin: 4.7 g/dL (ref 4.0–5.0)
Alkaline Phosphatase: 61 IU/L (ref 39–117)
BUN/Creatinine Ratio: 7 — ABNORMAL LOW (ref 9–20)
BUN: 6 mg/dL (ref 6–24)
Bilirubin Total: 0.4 mg/dL (ref 0.0–1.2)
CO2: 22 mmol/L (ref 20–29)
Calcium: 11.7 mg/dL — ABNORMAL HIGH (ref 8.7–10.2)
Chloride: 101 mmol/L (ref 96–106)
Creatinine, Ser: 0.88 mg/dL (ref 0.76–1.27)
GFR calc Af Amer: 117 mL/min/{1.73_m2} (ref >59)
GFR calc non Af Amer: 101 mL/min/{1.73_m2} (ref >59)
Globulin, Total: 2.9 g/dL (ref 1.5–4.5)
Glucose: 98 mg/dL (ref 65–99)
Potassium: 3.8 mmol/L (ref 3.5–5.2)
Sodium: 143 mmol/L (ref 134–144)
Total Protein: 7.6 g/dL (ref 6.0–8.5)

## 2019-07-09 ENCOUNTER — Telehealth: Payer: Self-pay

## 2019-07-09 NOTE — Telephone Encounter (Signed)
-----   Message from Tamsen Roers, [place] sent at 07/09/2019  8:23 AM EDT ----- Preliminary lab reports essentially normal except cholesterol and calcium high. Don't use too much calcium (as you might get if using antacids), increase water intake and lower fats in diet. An increase in walking for exercise can help lower cholesterol. Will recheck these levels in 3 months.

## 2019-07-09 NOTE — Telephone Encounter (Signed)
Awaiting all resuslts before calling patienet

## 2019-07-13 NOTE — Telephone Encounter (Signed)
Advised 

## 2019-07-14 LAB — DRUG SCREEN 12+ALCOHOL+CRT, UR
Amphetamines, Urine: NEGATIVE ng/mL
BENZODIAZ UR QL: NEGATIVE ng/mL
Barbiturate: NEGATIVE ng/mL
Cannabinoids: POSITIVE — AB
Cocaine (Metabolite): NEGATIVE ng/mL
Creatinine, Urine: 176 mg/dL (ref 20.0–300.0)
Ethanol, Urine: POSITIVE — AB
Meperidine: NEGATIVE ng/mL
Methadone: NEGATIVE ng/mL
OPIATE SCREEN URINE: NEGATIVE ng/mL
Oxycodone/Oxymorphone, Urine: NEGATIVE ng/mL
Phencyclidine: NEGATIVE ng/mL
Propoxyphene: NEGATIVE ng/mL
Tramadol: NEGATIVE ng/mL

## 2019-07-15 ENCOUNTER — Telehealth: Payer: Self-pay

## 2019-07-15 NOTE — Telephone Encounter (Signed)
LMTCB

## 2019-07-15 NOTE — Telephone Encounter (Signed)
-----   Message from Tamsen Roers, [place] sent at 07/15/2019  9:36 AM EDT ----- Since there is only alcohol and marijuana with no Clonazepam or Hydrocodone showing up on your drug screen, I won't be able to continue refilling these medications. It appears you have stopped the Clonazepam and the Hydrocodone since these should not be used with the alcohol and marijuana. Stopping these is a good idea since you have found a way to cope with the chronic back discomfort. It can save you the expense of these drugs and get away from the addiction risks.

## 2019-07-20 NOTE — Telephone Encounter (Signed)
Patient has been advised, he states that he has been taking his Clonazepam and Hydrocodone, patient states that he takes his medication in AM and the day he had urine drug he states he did not take his medication that morning. Patient was counseled on alcohol and drug use. KW

## 2019-07-31 ENCOUNTER — Other Ambulatory Visit: Payer: Self-pay | Admitting: Family Medicine

## 2019-07-31 NOTE — Telephone Encounter (Signed)
Requested medication (s) are due for refill today: yes  Requested medication (s) are on the active medication list: yes  Last refill:  05/03/19  Future visit scheduled: no  Notes to clinic:  medication not delegated to NT to refill   Requested Prescriptions  Pending Prescriptions Disp Refills   tiZANidine (ZANAFLEX) 4 MG tablet [Pharmacy Med Name: TIZANIDINE 4MG  TABLETS] 270 tablet 0    Sig: TAKE 1 TABLET(4 MG) BY MOUTH THREE TIMES DAILY      Not Delegated - Cardiovascular:  Alpha-2 Agonists - tizanidine Failed - 07/31/2019  9:16 AM      Failed - This refill cannot be delegated      Passed - Valid encounter within last 6 months    Recent Outpatient Visits           3 weeks ago Elevated LDL cholesterol level   [facility] Chrismon, [facility], Jod[place] Cipro   3 months ago Annual physical exam   [place], [place], [place]   7 months ago Chronic 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   [place], [place], [place]   10 months ago Chronic 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   [place], [facility], Jodell Cipro   1 year ago Paranoid schizophrenia [facility])   [facility] Chrismon, [facility], Jodell Cipro

## 2019-08-02 ENCOUNTER — Other Ambulatory Visit: Payer: Self-pay | Admitting: Family Medicine

## 2019-08-02 DIAGNOSIS — F410 Panic disorder [episodic paroxysmal anxiety] without agoraphobia: Secondary | ICD-10-CM

## 2019-08-02 DIAGNOSIS — M48062 Spinal stenosis, lumbar region with neurogenic claudication: Secondary | ICD-10-CM

## 2019-08-02 DIAGNOSIS — G8929 Other chronic pain: Secondary | ICD-10-CM

## 2019-08-02 DIAGNOSIS — M5441 Lumbago with sciatica, right side: Secondary | ICD-10-CM

## 2019-08-02 NOTE — Telephone Encounter (Signed)
Requested medication (s) are due for refill today -yes  Requested medication (s) are on the active medication list -yes  Future visit scheduled -no  Last refill: 07/02/19  Notes to clinic: Request for non delegated Rx  Requested Prescriptions  Pending Prescriptions Disp Refills   clonazePAM (KLONOPIN) 1 MG tablet [Pharmacy Med Name: CLONAZEPAM 1MG  TABLETS] 60 tablet     Sig: TAKE 1 TABLET(1 MG) BY MOUTH TWICE DAILY AS NEEDED FOR ANXIETY      Not Delegated - Psychiatry:  Anxiolytics/Hypnotics Failed - 08/02/2019  6:19 PM      Failed - This refill cannot be delegated      Failed - Urine Drug Screen completed in last 360 days.      Passed - Valid encounter within last 6 months    Recent Outpatient Visits           3 weeks ago Elevated LDL cholesterol level   [facility] Chrismon, [facility], Jodell Cipro   3 months ago Annual physical exam   [place], [place], [place]   7 months ago Chronic 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   [place], [place], [place]   10 months ago Chronic 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   [place], [facility], Jod[place] Cipro   1 year ago Paranoid schizophrenia [facility])   [facility], [facility], J[place] Cipro                  Requested Prescriptions  Pending Prescriptions Disp Refills   clonazePAM (KLONOPIN) 1 MG tablet [Pharmacy Med Name: CLONAZEPAM 1MG  TABLETS] 60 tablet     Sig: TAKE 1 TABLET(1 MG) BY MOUTH TWICE DAILY AS NEEDED FOR ANXIETY      Not Delegated - Psychiatry:  Anxiolytics/Hypnotics Failed - 08/02/2019  6:19 PM      Failed - This refill cannot be delegated      Failed - Urine Drug Screen completed in last 360 days.      Passed - Valid encounter within last 6 months    Recent Outpatient Visits           3 weeks ago Elevated LDL cholesterol level   [facility] Chrismon, 10/02/2019, [facility]   3 months ago Annual  physical exam   [place] Cipro, [place] E, [facility]   7 months ago Chronic 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   [place], [place] E, [facility]   10 months ago Chronic 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   [place], [place], [facility]   1 year ago Paranoid schizophrenia [facility])   [facility] Chrismon, [facility], [facility]

## 2019-08-02 NOTE — Telephone Encounter (Signed)
HYDROcodone-acetaminophen (NORCO) 7.5-325 MG tablet  clonazePAM (KLONOPIN) 1 MG tablet      Patient is requesting refills.    Pharmacy:  [facility] #[id number] [name], [place] - [address] AT [facility] OF [address] [facility] Phone:  [phone]  Fax:  [phone]

## 2019-08-02 NOTE — Telephone Encounter (Signed)
Requested medication (s) are due for refill today: Clonazepam, yes  Requested medication (s) are on the active medication list: yes  Last refill:  07/02/19  Future visit scheduled: no  Notes to clinic:  not delegated  Requested medication (s) are due for refill today: Hydrocodone-Acetaminophen 7.5-325, yes  Requested medication (s) are on the active medication list: yes  Last refill:  07/02/19  Future visit scheduled: no  Notes to clinic: not delegated  Requested Prescriptions  Pending Prescriptions Disp Refills   clonazePAM (KLONOPIN) 1 MG tablet 60 tablet 0    Sig: Take 1 tablet (1 mg total) by mouth 2 (two) times daily as needed. for anxiety      Not Delegated - Psychiatry:  Anxiolytics/Hypnotics Failed - 08/02/2019 11:43 AM      Failed - This refill cannot be delegated      Failed - Urine Drug Screen completed in last 360 days.      Passed - Valid encounter within last 6 months    Recent Outpatient Visits           3 weeks ago Elevated LDL cholesterol level   [facility] Chrismon, [place], [place]   3 months ago Annual physical exam   [facility], [place] E, [place]   7 months ago Chronic 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   [facility], [place], [place]   10 months ago Chronic 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   [facility] [facility], [place] E, [place]   1 year ago Paranoid schizophrenia [facility])   [employer], Jod[place] Cipro, [place]                HYDROcodone-acetaminophen (NORCO) 7.5-325 MG tablet 60 tablet 0    Sig: Take 1 tablet by mouth 2 (two) times daily as needed.      Not Delegated - Analgesics:  Opioid Agonist Combinations Failed - 08/02/2019 11:43 AM      Failed - This refill cannot be delegated      Failed - Urine Drug Screen completed in last 360 days.      Passed - Valid encounter within last 6 months    Recent Outpatient Visits           3 weeks ago Elevated LDL cholesterol level   [facility] Chrismon, [place] Cipro, [place]   3 months ago Annual physical exam   [facility], [place] E, [place]   7 months ago Chronic 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   [facility] [facility], [place], [place]   10 months ago Chronic 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   [facility] [facility], J[place] Cipro, [place]   1 year ago Paranoid schizophrenia [facility])   [facility] Chrismon, Jod[place] Cipro, [place]

## 2019-08-02 NOTE — Telephone Encounter (Signed)
Patient calling to check status of this medication.

## 2019-09-21 ENCOUNTER — Other Ambulatory Visit: Payer: Self-pay | Admitting: Family Medicine

## 2019-09-21 DIAGNOSIS — F410 Panic disorder [episodic paroxysmal anxiety] without agoraphobia: Secondary | ICD-10-CM

## 2019-11-01 ENCOUNTER — Other Ambulatory Visit: Payer: Self-pay | Admitting: Family Medicine

## 2019-11-01 NOTE — Telephone Encounter (Signed)
Requested medication (s) are due for refill today: yes  Requested medication (s) are on the active medication list: yes  Last refill:  08/02/2019  Future visit scheduled: no  Notes to clinic:  this refill cannot be delegated    Requested Prescriptions  Pending Prescriptions Disp Refills   tiZANidine (ZANAFLEX) 4 MG tablet [Pharmacy Med Name: TIZANIDINE 4MG  TABLETS] 270 tablet 0    Sig: TAKE 1 TABLET(4 MG) BY MOUTH THREE TIMES DAILY      Not Delegated - Cardiovascular:  Alpha-2 Agonists - tizanidine Failed - 11/01/2019  7:10 AM      Failed - This refill cannot be delegated      Passed - Valid encounter within last 6 months    Recent Outpatient Visits           3 months ago Elevated LDL cholesterol level   [facility] Chrismon, [facility], [place]   7 months ago Annual physical exam   Jodell Cipro, [place], [place]   10 months ago Chronic 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   [place], [place], [place]   1 year ago Chronic 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   [place], [employer], [place] Cipro   1 year ago Paranoid schizophrenia (HCC)   [place], [place], [place]                tiZANidine (ZANAFLEX) 4 MG tablet [Pharmacy Med Name: TIZANIDINE 4MG  TABLETS] 270 tablet 0    Sig: TAKE 1 TABLET(4 MG) BY MOUTH THREE TIMES DAILY      Not Delegated - Cardiovascular:  Alpha-2 Agonists - tizanidine Failed - 11/01/2019  7:10 AM      Failed - This refill cannot be delegated      Passed - Valid encounter within last 6 months    Recent Outpatient Visits           3 months ago Elevated LDL cholesterol level   [facility] Chrismon, 01/02/2020, [facility]   7 months ago Annual physical exam   J[place]ell Cipro, [place], [facility]   10 months ago Chronic 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   [place], [place] E,  [employer]   1 year ago 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

## 2019-12-02 ENCOUNTER — Other Ambulatory Visit: Payer: Self-pay | Admitting: Family Medicine

## 2019-12-02 DIAGNOSIS — G8929 Other chronic pain: Secondary | ICD-10-CM

## 2019-12-02 DIAGNOSIS — M48062 Spinal stenosis, lumbar region with neurogenic claudication: Secondary | ICD-10-CM

## 2019-12-02 DIAGNOSIS — M5441 Lumbago with sciatica, right side: Secondary | ICD-10-CM

## 2019-12-02 MED ORDER — MELOXICAM 15 MG PO TABS: 7.5000 mg | ORAL_TABLET | Freq: Two times a day (BID) | ORAL | 3 refills | Status: DC

## 2019-12-02 NOTE — Telephone Encounter (Signed)
[email or web address] faxed refill request for the following medications:  meloxicam (MOBIC) 15 MG tablet  Last Rx: 03/16/2019 LOV: 07/06/2019 This is a pt of [name]. Can Dr. Sullivan Lone review since Maurine Minister is out of the office? Please advise. Thanks TNP

## 2020-02-04 ENCOUNTER — Other Ambulatory Visit: Payer: Self-pay | Admitting: Family Medicine

## 2020-02-04 NOTE — Telephone Encounter (Signed)
Pt called in to request a refill for tiZANidine (ZANAFLEX) 4 MG tablet     Pharmacy: [facility] #[id number] [name], [place] - [address] AT [facility] OF [address] [place]  [address] [place], [place] [address]  Phone:  [phone] Fax:  [phone]

## 2020-02-04 NOTE — Telephone Encounter (Signed)
Requested medication (s) are due for refill today: yes   Requested medication (s) are on the active medication list: yes   Last refill:  11/01/19 #270 0 refills  Future visit scheduled: no , called patient to set up appt , patient will call back when he can manage transportation  Notes to clinic:  not delegated per protocol     Requested Prescriptions  Pending Prescriptions Disp Refills   tiZANidine (ZANAFLEX) 4 MG tablet 270 tablet 0    Sig: TAKE 1 TABLET(4 MG) BY MOUTH THREE TIMES DAILY      Not Delegated - Cardiovascular:  Alpha-2 Agonists - tizanidine Failed - 02/04/2020  2:34 PM      Failed - This refill cannot be delegated      Failed - Valid encounter within last 6 months    Recent Outpatient Visits           7 months ago Elevated LDL cholesterol level   [facility] Chrism[place], [place], [place]   10 months ago Annual physical exam   [facility] [facility], [place] E, [place]   1 year ago Chronic 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   [facility], [place] E, [place]   1 year ago Chronic 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   [employer] [facility], [place] Cipro, [place]   2 years ago Paranoid schizophrenia [facility])   [facility] Chrismon, [place] Cipro, [place]

## 2020-02-07 MED ORDER — TIZANIDINE HCL 4 MG PO TABS: ORAL_TABLET | ORAL | 0 refills | Status: DC

## 2020-02-14 ENCOUNTER — Encounter: Payer: Self-pay | Admitting: Family Medicine

## 2020-02-14 ENCOUNTER — Other Ambulatory Visit: Payer: Self-pay

## 2020-02-14 ENCOUNTER — Ambulatory Visit (INDEPENDENT_AMBULATORY_CARE_PROVIDER_SITE_OTHER): Payer: Medicare HMO | Admitting: Family Medicine

## 2020-02-14 VITALS — BP 114/78 | HR 109 | Temp 99.5°F | Wt 225.0 lb

## 2020-02-14 DIAGNOSIS — M5441 Lumbago with sciatica, right side: Secondary | ICD-10-CM | POA: Diagnosis not present

## 2020-02-14 DIAGNOSIS — G894 Chronic pain syndrome: Secondary | ICD-10-CM | POA: Diagnosis not present

## 2020-02-14 DIAGNOSIS — E7800 Pure hypercholesterolemia, unspecified: Secondary | ICD-10-CM | POA: Diagnosis not present

## 2020-02-14 DIAGNOSIS — G8929 Other chronic pain: Secondary | ICD-10-CM | POA: Diagnosis not present

## 2020-02-14 DIAGNOSIS — F5101 Primary insomnia: Secondary | ICD-10-CM

## 2020-02-14 DIAGNOSIS — M48062 Spinal stenosis, lumbar region with neurogenic claudication: Secondary | ICD-10-CM

## 2020-02-14 MED ORDER — MELOXICAM 15 MG PO TABS: 7.5000 mg | ORAL_TABLET | Freq: Two times a day (BID) | ORAL | 3 refills | Status: DC

## 2020-02-14 MED ORDER — BELSOMRA 5 MG PO TABS: 5.0000 mg | ORAL_TABLET | Freq: Every evening | ORAL | 0 refills | Status: DC | PRN

## 2020-02-14 NOTE — Progress Notes (Signed)
Established patient visit   Patient: David Reeves   DOB: August 15, 1970   49 y.o. Male  MRN: [id number] Visit Date: 02/14/2020  Today's healthcare provider: Dortha Kern, PA   No chief complaint on file.  Subjective    HPI  The patient is a 49 year old male who presents today for follow up of muscle and back pain.  He is in need of refills on his muscle relaxer and anti inflammatory medications. He states he is still in pain but using hot showers to help and gets relief.  He states he takes the Meloxicam 7.5 in the morning and then in the evening.  He is also taking Ibuprofen 800 mg BID. He also states he is not sleeping well and wanted to discuss getting something for it.    Past Medical History:  Diagnosis Date   Back pain 2014   Bone spur    Bulging disc 2014   Degenerative disc disease    Osteoarthritis    Panic anxiety syndrome    Taking multiple medications for chronic disease    Past Surgical History:  Procedure Laterality Date   CYSTECTOMY     head   HERNIA REPAIR Left 2006,2014   [facility]   TOE SURGERY Right 2007   Social History   Tobacco Use   Smoking status: Never Smoker   Smokeless tobacco: Never Used   Tobacco comment: cbd    Vaping Use   Vaping Use: Never used  Substance Use Topics   Alcohol use: Not Currently    Comment: last used 12-22-17   Drug use: Not Currently    Types: Marijuana    Comment: October 14, 2017   Family Status  Relation Name Status   Mother  Deceased   Father  Deceased   Daughter  Alive   Mat Uncle  Alive   MGM  Deceased   MGF  Deceased   Allergies  Allergen Reactions   Duloxetine     Other reaction(s): Other (See Comments) Increased temperature, sweating, uncontrolled shaking, aggressive thoughts   Amitriptyline Other (See Comments)    Urine retention   Buspirone Other (See Comments)    Kidney pain   Ciprofloxacin Other (See Comments)    hallucinations   Doxepin Other (See Comments)    "Didn't feel right"    Escitalopram Other (See Comments)    Agitation   Olanzapine    Penicillins Other (See Comments)    Unknown -- childhood reaction   Sertraline Other (See Comments)    Urine retention   Trazodone Other (See Comments)    abd pain, urine retention     Medications: Outpatient Medications Prior to Visit  Medication Sig   Cholecalciferol (VITAMIN D3) 2000 units TABS Take by mouth.   meloxicam (MOBIC) 15 MG tablet Take 0.5 tablets (7.5 mg total) by mouth 2 (two) times daily.   Multiple Vitamin (MULTIVITAMIN) tablet Take 1 tablet by mouth daily.   tiZANidine (ZANAFLEX) 4 MG tablet TAKE 1 TABLET(4 MG) BY MOUTH THREE TIMES DAILY   UNABLE TO FIND cbd   clonazePAM (KLONOPIN) 1 MG tablet Take 1 tablet (1 mg total) by mouth 2 (two) times daily as needed. for anxiety (Patient not taking: Reported on 02/14/2020)   HYDROcodone-acetaminophen (NORCO) 7.5-325 MG tablet Take 1 tablet by mouth 2 (two) times daily as needed. (Patient not taking: Reported on 02/14/2020)   No facility-administered medications prior to visit.    Review of Systems  Constitutional: Negative.  HENT: Negative.   Eyes: Negative.   Respiratory: Negative.   Cardiovascular: Negative.   Genitourinary: Negative.   Musculoskeletal: Positive for arthralgias, back pain and myalgias. Negative for gait problem, joint swelling, neck pain and neck stiffness.      Objective    BP 114/78 (BP Location: Right Arm, Patient Position: Sitting, Cuff Size: Normal)   Pulse (!) 109   Temp 99.5 F (37.5 C) (Oral)   Wt 225 lb (102.1 kg)   SpO2 97%   BMI 29.69 kg/m  BP Readings from Last 3 Encounters:  02/14/20 114/78  07/06/19 122/84  04/05/19 130/86   Wt Readings from Last 3 Encounters:  02/14/20 225 lb (102.1 kg)  07/06/19 221 lb (100.2 kg)  04/05/19 235 lb (106.6 kg)     Physical Exam Constitutional:      General: He is not in acute distress.    Appearance: He is well-developed.  HENT:     Head: Normocephalic and  atraumatic.     Right Ear: Hearing normal.     Left Ear: Hearing normal.     Nose: Nose normal.  Eyes:     General: Lids are normal. No scleral icterus.       Right eye: No discharge.        Left eye: No discharge.     Conjunctiva/sclera: Conjunctivae normal.  Cardiovascular:     Rate and Rhythm: Tachycardia present.     Heart sounds: Normal heart sounds.  Pulmonary:     Effort: Pulmonary effort is normal. No respiratory distress.  Abdominal:     General: Bowel sounds are normal.     Palpations: Abdomen is soft.  Musculoskeletal:        General: Normal range of motion.     Cervical back: Neck supple.  Skin:    Findings: No lesion or rash.  Neurological:     Mental Status: He is alert and oriented to person, place, and time.  Psychiatric:        Speech: Speech normal.        Behavior: Behavior normal.        Thought Content: Thought content normal.     No results found for any visits on 02/14/20.  Assessment & Plan     1. Chronic pain syndrome Daily pain in lower back. Has been on opiates in the past and evaluation at pain clinic. Did not want to continue narcotics, so he decided to stop them and uses NSAID with muscle relaxant. Refilled Meloxicam 15 mg 1/2 tablet BID and continue Xanaflex 4 mg TID. Continue efforts to use exercises and stretching with applications of Salonpas Lidocaine patches prn. - CBC with Differential/Platelet - Comprehensive metabolic panel  2. Elevated LDL cholesterol level Total cholesterol was 230 and LDL 164 on 07-06-19. Recommend low fat diet and recheck labs. - CBC with Differential/Platelet - Comprehensive metabolic panel - Lipid panel - TSH  3. Primary insomnia States he is not taking the Clonazepam today. With chronic anxiety and panic attacks, will give trial of Lunesta 5 mg hs #30 (written).   4. Chronic 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 Mid to low back tenderness and some spasm with sitting for long periods. No new  injury. Continue present medications.  5. Lumbar stenosis with neurogenic claudication Ambulating well without muscle weakness.    No follow-ups on file.      Haywood Pao, PA, have reviewed all documentation for this visit. The documentation on 02/14/20 for the exam, diagnosis,  procedures, and orders are all accurate and complete.    Dortha Kern, PA  [facility] [phone] (phone) [phone] (fax)  [facility]

## 2020-02-15 DIAGNOSIS — E7800 Pure hypercholesterolemia, unspecified: Secondary | ICD-10-CM | POA: Diagnosis not present

## 2020-02-15 DIAGNOSIS — G894 Chronic pain syndrome: Secondary | ICD-10-CM | POA: Diagnosis not present

## 2020-02-16 LAB — CBC WITH DIFFERENTIAL/PLATELET
Basophils Absolute: 0.1 10*3/uL (ref 0.0–0.2)
Basos: 1 %
EOS (ABSOLUTE): 0.2 10*3/uL (ref 0.0–0.4)
Eos: 3 %
Hematocrit: 42.2 % (ref 37.5–51.0)
Hemoglobin: 14.1 g/dL (ref 13.0–17.7)
Immature Grans (Abs): 0 10*3/uL (ref 0.0–0.1)
Immature Granulocytes: 0 %
Lymphocytes Absolute: 1.9 10*3/uL (ref 0.7–3.1)
Lymphs: 33 %
MCH: 32 pg (ref 26.6–33.0)
MCHC: 33.4 g/dL (ref 31.5–35.7)
MCV: 96 fL (ref 79–97)
Monocytes Absolute: 0.4 10*3/uL (ref 0.1–0.9)
Monocytes: 8 %
Neutrophils Absolute: 3.1 10*3/uL (ref 1.4–7.0)
Neutrophils: 55 %
Platelets: 360 10*3/uL (ref 150–450)
RBC: 4.41 x10E6/uL (ref 4.14–5.80)
RDW: 11.9 % (ref 11.6–15.4)
WBC: 5.7 10*3/uL (ref 3.4–10.8)

## 2020-02-16 LAB — COMPREHENSIVE METABOLIC PANEL
ALT: 23 IU/L (ref 0–44)
AST: 14 IU/L (ref 0–40)
Albumin/Globulin Ratio: 1.6 (ref 1.2–2.2)
Albumin: 4.1 g/dL (ref 4.0–5.0)
Alkaline Phosphatase: 63 IU/L (ref 44–121)
BUN/Creatinine Ratio: 6 — ABNORMAL LOW (ref 9–20)
BUN: 5 mg/dL — ABNORMAL LOW (ref 6–24)
Bilirubin Total: 0.3 mg/dL (ref 0.0–1.2)
CO2: 22 mmol/L (ref 20–29)
Calcium: 9.7 mg/dL (ref 8.7–10.2)
Chloride: 105 mmol/L (ref 96–106)
Creatinine, Ser: 0.79 mg/dL (ref 0.76–1.27)
GFR calc Af Amer: 121 mL/min/{1.73_m2} (ref >59)
GFR calc non Af Amer: 105 mL/min/{1.73_m2} (ref >59)
Globulin, Total: 2.6 g/dL (ref 1.5–4.5)
Glucose: 111 mg/dL — ABNORMAL HIGH (ref 65–99)
Potassium: 4.2 mmol/L (ref 3.5–5.2)
Sodium: 141 mmol/L (ref 134–144)
Total Protein: 6.7 g/dL (ref 6.0–8.5)

## 2020-02-16 LAB — LIPID PANEL
Chol/HDL Ratio: 5 ratio (ref 0.0–5.0)
Cholesterol, Total: 185 mg/dL (ref 100–199)
HDL: 37 mg/dL — ABNORMAL LOW (ref >39)
LDL Chol Calc (NIH): 126 mg/dL — ABNORMAL HIGH (ref 0–99)
Triglycerides: 119 mg/dL (ref 0–149)
VLDL Cholesterol Cal: 22 mg/dL (ref 5–40)

## 2020-02-16 LAB — TSH: TSH: 1.13 u[IU]/mL (ref 0.450–4.500)

## 2020-03-02 ENCOUNTER — Other Ambulatory Visit: Payer: Self-pay | Admitting: Family Medicine

## 2020-03-02 DIAGNOSIS — G894 Chronic pain syndrome: Secondary | ICD-10-CM

## 2020-03-08 ENCOUNTER — Other Ambulatory Visit: Payer: Self-pay | Admitting: Family Medicine

## 2020-03-08 ENCOUNTER — Telehealth: Payer: Self-pay

## 2020-03-08 DIAGNOSIS — F5101 Primary insomnia: Secondary | ICD-10-CM

## 2020-03-08 NOTE — Telephone Encounter (Signed)
Medication Refill - Medication: tiZANidine (ZANAFLEX) 4 MG tablet (Patient requested tizanidine go to [employer] and Suvorexant (BELSOMRA) 5 MG TABS Medication be sent to [facility]. Patient stated that he will run out of Suvorexant during thanksgiving holiday and wanted to have refill sent now.)   Has the patient contacted their pharmacy? yes (Agent: If no, request that the patient contact the pharmacy for the refill.) (Agent: If yes, when and what did the pharmacy advise?)Contact PCP  Preferred Pharmacy (with phone number or street name):  [facility] #[id number] [name], [place] - [address] AT [facility] OF [address] [facility] Phone:  [phone]  Fax:  [phone]    [id number] [address] [place] - [address] [place] [address] Phone:  [phone]  Fax:  [phone]       Agent: Please be advised that RX refills may take up to 3 business days. We ask that you follow-up with your pharmacy.

## 2020-03-08 NOTE — Telephone Encounter (Signed)
Requested medication (s) are due for refill today: yes   Requested medication (s) are on the active medication list: yes   Last refill:  02/14/2020 02/07/2020  Future visit scheduled: no  Notes to clinic:  this medication cannot be delegated    tiZANidine (ZANAFLEX) 4 MG tablet (Patient requested tizanidine go to [employer] and Suvorexant (BELSOMRA) 5 MG TABS Medication be sent to [facility] [facility]. Patient stated that he will run out of Suvorexant during thanksgiving holiday and wanted to have refill sent now.)  Requested Prescriptions  Pending Prescriptions Disp Refills   Suvorexant (BELSOMRA) 5 MG TABS 30 tablet 0    Sig: Take 5 mg by mouth at bedtime as needed.      Off-Protocol Failed - 03/08/2020 10:05 AM      Failed - Medication not assigned to a protocol, review manually.      Passed - Valid encounter within last 12 months    Recent Outpatient Visits           3 weeks ago Chronic pain syndrome   [facility] Chrismon, [place], [place]   8 months ago Elevated LDL cholesterol level   [facility], Jodell Cipro, [place]   11 months ago Annual physical exam   [employer], [place], [place]   1 year ago Chronic 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   [facility], [place] E, [place]   1 year ago Chronic 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   [employer], Jodell Cipro, [place]                tiZANidine (ZANAFLEX) 4 MG tablet 90 tablet 0    Sig: TAKE 1 TABLET(4 MG) BY MOUTH THREE TIMES DAILY      Not Delegated - Cardiovascular:  Alpha-2 Agonists - tizanidine Failed - 03/08/2020 10:05 AM      Failed - This refill cannot be delegated      Passed - Valid encounter within last 6 months    Recent Outpatient Visits           3 weeks ago Chronic pain syndrome   [facility] Chrismon, [place] E, [place]   8 months ago Elevated LDL cholesterol level   [facility], Jodell Cipro, [place]   11 months ago Annual physical exam   [facility] [facility], [place], [place]   1 year ago Chronic 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   [facility] [facility], [place] E, [place]   1 year ago Chronic 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   [facility] Chrismon, Jodell Cipro, [place]

## 2020-03-08 NOTE — Telephone Encounter (Signed)
Copied from CRM [id number]. Topic: General - Other >> Mar 08, 2020  9:58 AM Dalphine Handing A wrote: Patient wanted to inform PCP that his sleep medication is working out really well for him .

## 2020-03-09 ENCOUNTER — Other Ambulatory Visit: Payer: Self-pay | Admitting: Family Medicine

## 2020-03-09 DIAGNOSIS — F5101 Primary insomnia: Secondary | ICD-10-CM

## 2020-03-09 MED ORDER — BELSOMRA 5 MG PO TABS: 5.0000 mg | ORAL_TABLET | Freq: Every evening | ORAL | 1 refills | Status: DC | PRN

## 2020-03-09 MED ORDER — TIZANIDINE HCL 4 MG PO TABS: ORAL_TABLET | ORAL | 1 refills | Status: DC

## 2020-03-10 ENCOUNTER — Other Ambulatory Visit: Payer: Self-pay | Admitting: Family Medicine

## 2020-03-10 DIAGNOSIS — F5101 Primary insomnia: Secondary | ICD-10-CM

## 2020-03-10 NOTE — Telephone Encounter (Signed)
°  Notes to clinic: medication not assigned to a protocol, review manually Looks like it was sent to a different pharmacy yesterday    Requested Prescriptions  Pending Prescriptions Disp Refills   BELSOMRA 5 MG TABS [Pharmacy Med Name: BELSOMRA 5MG  TABLETS] 30 tablet     Sig: TAKE 1 TABLET BY MOUTH AT BEDTIME AS NEEDED      Off-Protocol Failed - 03/10/2020  8:48 AM      Failed - Medication not assigned to a protocol, review manually.      Passed - Valid encounter within last 12 months    Recent Outpatient Visits           3 weeks ago Chronic pain syndrome   [facility] Chrismon, [place] E, [place]   8 months ago Elevated LDL cholesterol level   [place], [employer], Jodell Cipro   11 months ago Annual physical exam   [place], [place], [place]   1 year ago Chronic 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   [place], [place], [place]   1 year ago Chronic 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   [facility] Chrismon, [facility], Jodell Cipro

## 2020-03-12 ENCOUNTER — Other Ambulatory Visit: Payer: Self-pay | Admitting: Family Medicine

## 2020-03-12 DIAGNOSIS — F5101 Primary insomnia: Secondary | ICD-10-CM

## 2020-03-12 NOTE — Telephone Encounter (Signed)
:     Notes to clinic:  medication is not assigned to a protocol,review manually Looks like this refill is being requested by a different pharmacy    Requested Prescriptions  Pending Prescriptions Disp Refills   BELSOMRA 5 MG TABS [Pharmacy Med Name: BELSOMRA 5MG  TABLETS] 30 tablet     Sig: TAKE 1 TABLET BY MOUTH AT BEDTIME AS NEEDED      Off-Protocol Failed - 03/12/2020  9:55 AM      Failed - Medication not assigned to a protocol, review manually.      Passed - Valid encounter within last 12 months    Recent Outpatient Visits           3 weeks ago Chronic pain syndrome   [facility] Chrismon, [place], [place]   8 months ago Elevated LDL cholesterol level   [place], [employer], Jodell Cipro   11 months ago Annual physical exam   [place], [place] E, [place]   1 year ago Chronic 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   [place], [place] E, [place]   1 year ago Chronic 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   [facility] Chrismon, [facility], Jodell Cipro

## 2020-03-14 ENCOUNTER — Telehealth: Payer: Self-pay

## 2020-03-14 ENCOUNTER — Other Ambulatory Visit: Payer: Self-pay | Admitting: Family Medicine

## 2020-03-14 DIAGNOSIS — F5101 Primary insomnia: Secondary | ICD-10-CM

## 2020-03-14 MED ORDER — BELSOMRA 5 MG PO TABS: 5.0000 mg | ORAL_TABLET | Freq: Every evening | ORAL | 1 refills | Status: DC | PRN

## 2020-03-14 NOTE — Telephone Encounter (Signed)
Advised 

## 2020-03-14 NOTE — Telephone Encounter (Signed)
Copied from CRM [id number]. Topic: General - Other >> Mar 14, 2020  8:12 AM Jaquita Rector A wrote: Reason for CRM: Patient called to inquire of the Rx for Suvorexant (BELSOMRA) 5 MG TABS need this Rx sent to the [facility] #[id number] [name], [place] - [address] AT [facility] OF [address] [facility] Phone:  [phone] Fax:  [phone] asking if Rx can be sent today since its too expensive at the [facility]. Please call patient with an update Ph# [phone]

## 2020-03-14 NOTE — Telephone Encounter (Signed)
Changed pharmacy to [facility].

## 2020-03-15 NOTE — Telephone Encounter (Signed)
PT is calling to F/UP on this request

## 2020-04-13 ENCOUNTER — Other Ambulatory Visit: Payer: Self-pay | Admitting: Family Medicine

## 2020-04-13 NOTE — Telephone Encounter (Signed)
Requested medication (s) are due for refill today: yes  Requested medication (s) are on the active medication list: yes  Last refill:  03/09/20 #90 with 1 refill  Future visit scheduled: No  Notes to clinic:  Please review for refill. Refill not delegated per protocol    Requested Prescriptions  Pending Prescriptions Disp Refills   tiZANidine (ZANAFLEX) 4 MG tablet [Pharmacy Med Name: Tizanidine Hydrochloride 4mg  Tablet] 90 tablet 0    Sig: Take 1 tablet by mouth three times daily.      Not Delegated - Cardiovascular:  Alpha-2 Agonists - tizanidine Failed - 04/13/2020  2:01 PM      Failed - This refill cannot be delegated      Passed - Valid encounter within last 6 months    Recent Outpatient Visits           1 month ago Chronic pain syndrome   [facility] Chrismon, [facility], PA-C   9 months ago Elevated LDL cholesterol level   Jodell Cipro, [employer], PA-C   1 year ago Annual physical exam   [place] Cipro, [employer], PA-C   1 year ago Chronic 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   J[place] Cipro, [employer], PA-C   1 year ago Chronic 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   [facility] Chrismon, [facility], Jodell Cipro

## 2020-05-03 ENCOUNTER — Other Ambulatory Visit: Payer: Self-pay | Admitting: Family Medicine

## 2020-05-03 DIAGNOSIS — F5101 Primary insomnia: Secondary | ICD-10-CM

## 2020-05-03 NOTE — Telephone Encounter (Signed)
Requested medication (s) are due for refill today: yes  Requested medication (s) are on the active medication list:yes  Last refill:  03/14/20  #30  1 refill  Future visit scheduled:no  Notes to clinic:  off protocol    Requested Prescriptions  Pending Prescriptions Disp Refills   Suvorexant (BELSOMRA) 5 MG TABS 30 tablet 1    Sig: Take 5 mg by mouth at bedtime as needed.      Off-Protocol Failed - 05/03/2020  1:58 PM      Failed - Medication not assigned to a protocol, review manually.      Passed - Valid encounter within last 12 months    Recent Outpatient Visits           2 months ago Chronic pain syndrome   [facility] Chrismon, Jodell Cipro, PA-C   10 months ago Elevated LDL cholesterol level   [facility] [facility], Jodell Cipro, PA-C   1 year ago Annual physical exam   [facility], Jodell Cipro, PA-C   1 year ago Chronic 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   [employer], Jodell Cipro, PA-C   1 year ago Chronic 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   [facility] Chrismon, Jodell Cipro, [place]

## 2020-05-03 NOTE — Telephone Encounter (Signed)
Medication Refill - Medication: Belsomra   Has the patient contacted their pharmacy? Yes.   The pt states that thy had nothing on file at the pharmacy for this prescription.  (Agent: If no, request that the patient contact the pharmacy for the refill.) (Agent: If yes, when and what did the pharmacy advise?)  Preferred Pharmacy (with phone number or street name):  [facility] #[id number] [name],  - [address] AT [facility] OF [address] [facility]  [address] [place] [address]  Phone: [phone] Fax: [phone]  Hours: Not open 24 hours     Agent: Please be advised that RX refills may take up to 3 business days. We ask that you follow-up with your pharmacy.

## 2020-05-04 MED ORDER — BELSOMRA 5 MG PO TABS: 5.0000 mg | ORAL_TABLET | Freq: Every evening | ORAL | 1 refills | Status: DC | PRN

## 2020-05-29 ENCOUNTER — Other Ambulatory Visit: Payer: Self-pay | Admitting: Family Medicine

## 2020-05-29 DIAGNOSIS — F5101 Primary insomnia: Secondary | ICD-10-CM

## 2020-05-29 MED ORDER — BELSOMRA 5 MG PO TABS: 5.0000 mg | ORAL_TABLET | Freq: Every evening | ORAL | 0 refills | Status: DC | PRN

## 2020-05-29 NOTE — Telephone Encounter (Signed)
Medication: Suvorexant (BELSOMRA) 5 MG TABS Has the pt contacted their pharmacy? No he said he could not get through  Preferred pharmacy: [facility] #[id number] - [place], [place] - [address] AT [facility] OF [address] [place]  Please be advised refills may take up to 3 business days.  We ask that you follow up with your pharmacy.

## 2020-05-29 NOTE — Telephone Encounter (Signed)
Requested medication (s) are due for refill today: yes  Requested medication (s) are on the active medication list: yes  Last refill:  05/04/20  Future visit scheduled: no  Notes to clinic:  not delegated   Requested Prescriptions  Pending Prescriptions Disp Refills   Suvorexant (BELSOMRA) 5 MG TABS 30 tablet 1    Sig: Take 5 mg by mouth at bedtime as needed.      Off-Protocol Failed - 05/29/2020  9:22 AM      Failed - Medication not assigned to a protocol, review manually.      Passed - Valid encounter within last 12 months    Recent Outpatient Visits           3 months ago Chronic pain syndrome   [facility] Chrismon, Jodell Cipro, PA-C   10 months ago Elevated LDL cholesterol level   [employer], Jodell Cipro, PA-C   1 year ago Annual physical exam   [employer], Jodell Cipro, PA-C   1 year ago Chronic 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   [employer], Jodell Cipro, PA-C   1 year ago Chronic 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   [facility] Chrismon, Jodell Cipro, [place]

## 2020-06-04 ENCOUNTER — Other Ambulatory Visit: Payer: Self-pay | Admitting: Family Medicine

## 2020-06-04 NOTE — Telephone Encounter (Signed)
Requested medication (s) are due for refill today: no  Requested medication (s) are on the active medication list:  yes  Last refill:  04/03/2020  Future visit scheduled: no  Notes to clinic:  this refill cannot be delegated    Requested Prescriptions  Pending Prescriptions Disp Refills   tiZANidine (ZANAFLEX) 4 MG tablet [Pharmacy Med Name: Tizanidine Hydrochloride 4mg  Tablet] 90 tablet 0    Sig: Take 1 tablet by mouth three times daily.      Not Delegated - Cardiovascular:  Alpha-2 Agonists - tizanidine Failed - 06/04/2020  6:43 PM      Failed - This refill cannot be delegated      Passed - Valid encounter within last 6 months    Recent Outpatient Visits           3 months ago Chronic pain syndrome   [facility] Chrismon, [facility], PA-C   11 months ago Elevated LDL cholesterol level   Jodell Cipro, [employer], PA-C   1 year ago Annual physical exam   Jodell Cipro, [employer], PA-C   1 year ago Chronic 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   Jodell Cipro, [employer], PA-C   1 year ago Chronic 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

## 2020-06-06 NOTE — Telephone Encounter (Signed)
PT called back trying to refill this prescription again, please advise patient of the status or why it cannot be refilled.

## 2020-08-16 ENCOUNTER — Other Ambulatory Visit: Payer: Self-pay | Admitting: Family Medicine

## 2020-08-16 NOTE — Telephone Encounter (Signed)
Requested medication (s) are due for refill today: no  Requested medication (s) are on the active medication list: yes  Last refill:  05/11/2020  Future visit scheduled: no  Notes to clinic:  this refill cannot be delegated    Requested Prescriptions  Pending Prescriptions Disp Refills   tiZANidine (ZANAFLEX) 4 MG tablet [Pharmacy Med Name: Tizanidine Hydrochloride 4mg  Tablet] 90 tablet 1    Sig: Take 1 tablet by mouth three times daily.      Not Delegated - Cardiovascular:  Alpha-2 Agonists - tizanidine Failed - 08/16/2020 11:54 AM      Failed - This refill cannot be delegated      Failed - Valid encounter within last 6 months    Recent Outpatient Visits           6 months ago Chronic pain syndrome   [facility] Chrismon, [facility], PA-C   1 year ago Elevated LDL cholesterol level   Jodell Cipro, [employer], PA-C   1 year ago Annual physical exam   [place] Cipro, [employer], PA-C   1 year ago Chronic 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   Jodell Cipro, [employer], PA-C   1 year ago Chronic 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   [facility] Chrismon, [facility], Jodell Cipro

## 2020-08-28 ENCOUNTER — Other Ambulatory Visit: Payer: Self-pay | Admitting: Family Medicine

## 2020-08-28 DIAGNOSIS — F5101 Primary insomnia: Secondary | ICD-10-CM

## 2020-08-28 NOTE — Telephone Encounter (Signed)
Requested medication (s) are due for refill today: Yes  Requested medication (s) are on the active medication list: Yes  Last refill:  05/29/20  Future visit scheduled: No  Notes to clinic:  See request.    Requested Prescriptions  Pending Prescriptions Disp Refills   BELSOMRA 5 MG TABS [Pharmacy Med Name: BELSOMRA 5MG  TABLETS] 30 tablet     Sig: TAKE 1 TABLET BY MOUTH AT BEDTIME AS NEEDED      Off-Protocol Failed - 08/28/2020  4:50 AM      Failed - Medication not assigned to a protocol, review manually.      Passed - Valid encounter within last 12 months    Recent Outpatient Visits           6 months ago Chronic pain syndrome   [facility] Chrismon, [facility], PA-C   1 year ago Elevated LDL cholesterol level   [facility] Chrismon, [facility], PA-C   1 year ago Annual physical exam   Jodell Cipro, [employer], PA-C   1 year ago Chronic 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   Jodell Cipro, [employer], PA-C   1 year ago Chronic 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   [facility] Chrismon, [facility], Jodell Cipro

## 2020-10-02 ENCOUNTER — Other Ambulatory Visit: Payer: Self-pay | Admitting: Family Medicine

## 2020-10-02 DIAGNOSIS — F5101 Primary insomnia: Secondary | ICD-10-CM

## 2020-10-02 NOTE — Telephone Encounter (Signed)
Requested medication (s) are due for refill today:  yes  Requested medication (s) are on the active medication list: yes   Last refill: 08/28/2020  Future visit scheduled: no  Notes to clinic: Medication not assigned to a protocol, review manually  Requested Prescriptions  Pending Prescriptions Disp Refills   BELSOMRA 5 MG TABS [Pharmacy Med Name: BELSOMRA 5MG  TABLETS] 30 tablet     Sig: TAKE 1 TABLET BY MOUTH AT BEDTIME AS NEEDED      Off-Protocol Failed - 10/02/2020  5:12 AM      Failed - Medication not assigned to a protocol, review manually.      Passed - Valid encounter within last 12 months    Recent Outpatient Visits           7 months ago Chronic pain syndrome   [facility] Chrismon, [facility], PA-C   1 year ago Elevated LDL cholesterol level   [facility] Chrismon, [facility], PA-C   1 year ago Annual physical exam   Jodell Cipro, [employer], PA-C   1 year ago Chronic 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   Jodell Cipro, [employer], PA-C   2 years ago Chronic 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   [facility] Chrismon, [facility], Jodell Cipro

## 2020-10-11 ENCOUNTER — Other Ambulatory Visit: Payer: Self-pay | Admitting: Family Medicine

## 2020-10-11 MED ORDER — TIZANIDINE HCL 4 MG PO TABS
4.0000 mg | ORAL_TABLET | Freq: Three times a day (TID) | ORAL | 0 refills | Status: DC
Start: 2020-10-11 — End: 2020-10-13

## 2020-10-11 NOTE — Telephone Encounter (Signed)
Patient called in states he dropped some of his pills(tiZANidine (ZANAFLEX) 4 MG tablet  in the toilet, and does'nt feel safe taking those, requesting another refill of this, has 2 left. Please call back

## 2020-10-12 NOTE — Telephone Encounter (Signed)
  Notes to clinic: Looks like script was sent yesterday NT cannot refuse this script    Requested Prescriptions  Pending Prescriptions Disp Refills   tiZANidine (ZANAFLEX) 4 MG tablet [Pharmacy Med Name: Tizanidine Hydrochloride 4mg  Tablet] 90 tablet 0    Sig: Take 1 tablet by mouth three times daily.      Not Delegated - Cardiovascular:  Alpha-2 Agonists - tizanidine Failed - 10/11/2020  5:28 PM      Failed - This refill cannot be delegated      Failed - Valid encounter within last 6 months    Recent Outpatient Visits           8 months ago Chronic pain syndrome   [facility], [facility], PA-C   1 year ago Elevated LDL cholesterol level   [facility]ell Cipro, [employer], PA-C   1 year ago Annual physical exam   Jodell Cipro, [employer], PA-C   1 year ago Chronic 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   Jodell Cipro, [employer], PA-C   2 years ago Chronic 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   Jodell Cipro, [employer], Jodell Cipro

## 2020-10-13 NOTE — Telephone Encounter (Signed)
[employer] is requesting refill for Tizanidine 4 mg. The Rx that was approved 10/11/20 was sent to local pharmacy. Please advise.

## 2020-10-20 DIAGNOSIS — I639 Cerebral infarction, unspecified: Secondary | ICD-10-CM

## 2020-10-20 HISTORY — DX: Cerebral infarction, unspecified: I63.9

## 2020-10-31 ENCOUNTER — Other Ambulatory Visit: Payer: Self-pay | Admitting: Family Medicine

## 2020-10-31 DIAGNOSIS — G894 Chronic pain syndrome: Secondary | ICD-10-CM

## 2020-11-03 ENCOUNTER — Other Ambulatory Visit: Payer: Self-pay | Admitting: Family Medicine

## 2020-11-03 DIAGNOSIS — F5101 Primary insomnia: Secondary | ICD-10-CM

## 2020-11-03 NOTE — Telephone Encounter (Signed)
Requested medication (s) are due for refill today   Requires manual review  Requested medication (s) are on the active medication list Yes  Future visit scheduled No  Note to clinic-Medication is not delegated and requires manual review by provider.  LOV 02/14/20   Requested Prescriptions  Pending Prescriptions Disp Refills   Suvorexant (BELSOMRA) 5 MG TABS 30 tablet 0    Sig: Take 1 tablet by mouth at bedtime as needed.      Off-Protocol Failed - 11/03/2020  4:10 PM      Failed - Medication not assigned to a protocol, review manually.      Passed - Valid encounter within last 12 months    Recent Outpatient Visits           8 months ago Chronic pain syndrome   [facility] Chr[facility]on, Jodell Cipro, PA-C   1 year ago Elevated LDL cholesterol level   [facility]on, Jodell Cipro, PA-C   1 year ago Annual physical exam   [employer], Jodell Cipro, PA-C   1 year ago Chronic 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   [employer], Jodell Cipro, PA-C   2 years ago Chronic 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   [facility] Chrismon, Jodell Cipro, [place]

## 2020-11-03 NOTE — Telephone Encounter (Signed)
Medication Refill - Medication: BELSOMRA 5 MG TABS  Has the patient contacted their pharmacy? Yes.    (Agent: If yes, when and what did the pharmacy advise?) Contact PCP   Preferred Pharmacy (with phone number or street name):   [id number] [name], [place] - [address] AT [facility] OF [address] [facility] Phone:  [phone]  Fax:  [phone]      Agent: Please be advised that RX refills may take up to 3 business days. We ask that you follow-up with your pharmacy.

## 2020-11-05 ENCOUNTER — Other Ambulatory Visit: Payer: Self-pay | Admitting: Family Medicine

## 2020-11-05 DIAGNOSIS — F5101 Primary insomnia: Secondary | ICD-10-CM

## 2020-11-06 ENCOUNTER — Other Ambulatory Visit: Payer: Self-pay | Admitting: Family Medicine

## 2020-11-06 NOTE — Telephone Encounter (Signed)
Requested medication (s) are due for refill today: yes  Requested medication (s) are on the active medication list:yes   Last refill:  10/03/2020  Future visit scheduled: no  Notes to clinic:  Medication not assigned to a protocol, review manually   Requested Prescriptions  Pending Prescriptions Disp Refills   BELSOMRA 5 MG TABS [Pharmacy Med Name: BELSOMRA 5MG  TABLETS] 30 tablet     Sig: TAKE 1 TABLET BY MOUTH AT BEDTIME AS NEEDED      Off-Protocol Failed - 11/05/2020  8:52 PM      Failed - Medication not assigned to a protocol, review manually.      Passed - Valid encounter within last 12 months    Recent Outpatient Visits           8 months ago Chronic pain syndrome   [facility] Chrismon, [facility], PA-C   1 year ago Elevated LDL cholesterol level   [facility] Chr[facility]on, [facility], PA-C   1 year ago Annual physical exam   [facility]ell Cipro, [employer], PA-C   1 year ago Chronic 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   [facility] Cipro, [employer], PA-C   2 years ago Chronic 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   [facility] Chrismon, [facility], Jodell Cipro

## 2020-11-06 NOTE — Telephone Encounter (Signed)
Requested medications are due for refill today.  yes  Requested medications are on the active medications list.  yes  Last refill. 10/13/2020  Future visit scheduled.   no  Notes to clinic.  Medication not delegated. 

## 2020-11-07 MED ORDER — BELSOMRA 5 MG PO TABS: 1.0000 | ORAL_TABLET | Freq: Every evening | ORAL | 0 refills | Status: DC | PRN

## 2020-11-07 NOTE — Addendum Note (Signed)
Addended by: Kavin Leech E on: 11/07/2020 02:57 PM   Modules accepted: Orders

## 2020-11-07 NOTE — Telephone Encounter (Signed)
Pt wants to know if the dr will please refill his Rx?  Pt is out and needs this med to sleep.  Pt made appt for 7/28, but hopes he doesn't have to wait that long.  BELSOMRA 5 MG TABS   [id number] - [place],  - [address] AT [facility] OF [address] [facility]

## 2020-11-08 DIAGNOSIS — Z9189 Other specified personal risk factors, not elsewhere classified: Secondary | ICD-10-CM | POA: Diagnosis not present

## 2020-11-08 DIAGNOSIS — R339 Retention of urine, unspecified: Secondary | ICD-10-CM | POA: Diagnosis not present

## 2020-11-08 DIAGNOSIS — R531 Weakness: Secondary | ICD-10-CM | POA: Diagnosis not present

## 2020-11-08 DIAGNOSIS — I63322 Cerebral infarction due to thrombosis of left anterior cerebral artery: Secondary | ICD-10-CM | POA: Diagnosis not present

## 2020-11-08 DIAGNOSIS — I63522 Cerebral infarction due to unspecified occlusion or stenosis of left anterior cerebral artery: Secondary | ICD-10-CM | POA: Diagnosis not present

## 2020-11-08 DIAGNOSIS — Z7901 Long term (current) use of anticoagulants: Secondary | ICD-10-CM | POA: Diagnosis not present

## 2020-11-08 DIAGNOSIS — G4700 Insomnia, unspecified: Secondary | ICD-10-CM | POA: Diagnosis not present

## 2020-11-08 DIAGNOSIS — Z79899 Other long term (current) drug therapy: Secondary | ICD-10-CM | POA: Diagnosis not present

## 2020-11-08 DIAGNOSIS — I639 Cerebral infarction, unspecified: Secondary | ICD-10-CM | POA: Diagnosis not present

## 2020-11-08 DIAGNOSIS — M6281 Muscle weakness (generalized): Secondary | ICD-10-CM | POA: Diagnosis not present

## 2020-11-08 DIAGNOSIS — E559 Vitamin D deficiency, unspecified: Secondary | ICD-10-CM | POA: Diagnosis not present

## 2020-11-08 DIAGNOSIS — R4189 Other symptoms and signs involving cognitive functions and awareness: Secondary | ICD-10-CM | POA: Diagnosis not present

## 2020-11-08 DIAGNOSIS — R29810 Facial weakness: Secondary | ICD-10-CM | POA: Diagnosis not present

## 2020-11-08 DIAGNOSIS — R5381 Other malaise: Secondary | ICD-10-CM | POA: Diagnosis not present

## 2020-11-08 DIAGNOSIS — I6340 Cerebral infarction due to embolism of unspecified cerebral artery: Secondary | ICD-10-CM | POA: Diagnosis not present

## 2020-11-08 DIAGNOSIS — E785 Hyperlipidemia, unspecified: Secondary | ICD-10-CM | POA: Diagnosis not present

## 2020-11-08 DIAGNOSIS — M5136 Other intervertebral disc degeneration, lumbar region: Secondary | ICD-10-CM | POA: Diagnosis not present

## 2020-11-08 DIAGNOSIS — R279 Unspecified lack of coordination: Secondary | ICD-10-CM | POA: Diagnosis not present

## 2020-11-08 DIAGNOSIS — G8929 Other chronic pain: Secondary | ICD-10-CM | POA: Diagnosis not present

## 2020-11-08 DIAGNOSIS — I69320 Aphasia following cerebral infarction: Secondary | ICD-10-CM | POA: Diagnosis not present

## 2020-11-08 DIAGNOSIS — I63512 Cerebral infarction due to unspecified occlusion or stenosis of left middle cerebral artery: Secondary | ICD-10-CM | POA: Diagnosis not present

## 2020-11-08 DIAGNOSIS — I69351 Hemiplegia and hemiparesis following cerebral infarction affecting right dominant side: Secondary | ICD-10-CM | POA: Diagnosis not present

## 2020-11-08 DIAGNOSIS — R918 Other nonspecific abnormal finding of lung field: Secondary | ICD-10-CM | POA: Diagnosis not present

## 2020-11-08 DIAGNOSIS — M5459 Other low back pain: Secondary | ICD-10-CM | POA: Diagnosis not present

## 2020-11-08 DIAGNOSIS — R4701 Aphasia: Secondary | ICD-10-CM | POA: Diagnosis not present

## 2020-11-08 DIAGNOSIS — F1210 Cannabis abuse, uncomplicated: Secondary | ICD-10-CM | POA: Diagnosis not present

## 2020-11-08 DIAGNOSIS — Z7982 Long term (current) use of aspirin: Secondary | ICD-10-CM | POA: Diagnosis not present

## 2020-11-08 DIAGNOSIS — R4182 Altered mental status, unspecified: Secondary | ICD-10-CM | POA: Diagnosis not present

## 2020-11-08 DIAGNOSIS — I6521 Occlusion and stenosis of right carotid artery: Secondary | ICD-10-CM | POA: Diagnosis not present

## 2020-11-08 DIAGNOSIS — F410 Panic disorder [episodic paroxysmal anxiety] without agoraphobia: Secondary | ICD-10-CM | POA: Diagnosis not present

## 2020-11-08 DIAGNOSIS — G8101 Flaccid hemiplegia affecting right dominant side: Secondary | ICD-10-CM | POA: Diagnosis not present

## 2020-11-08 DIAGNOSIS — I69392 Facial weakness following cerebral infarction: Secondary | ICD-10-CM | POA: Diagnosis not present

## 2020-11-08 DIAGNOSIS — R930 Abnormal findings on diagnostic imaging of skull and head, not elsewhere classified: Secondary | ICD-10-CM | POA: Diagnosis not present

## 2020-11-08 DIAGNOSIS — M545 Low back pain, unspecified: Secondary | ICD-10-CM | POA: Diagnosis not present

## 2020-11-08 DIAGNOSIS — F1410 Cocaine abuse, uncomplicated: Secondary | ICD-10-CM | POA: Diagnosis not present

## 2020-11-08 DIAGNOSIS — Z20822 Contact with and (suspected) exposure to covid-19: Secondary | ICD-10-CM | POA: Diagnosis not present

## 2020-11-08 DIAGNOSIS — I161 Hypertensive emergency: Secondary | ICD-10-CM | POA: Diagnosis not present

## 2020-11-08 DIAGNOSIS — I63312 Cerebral infarction due to thrombosis of left middle cerebral artery: Secondary | ICD-10-CM | POA: Diagnosis not present

## 2020-11-08 DIAGNOSIS — I63523 Cerebral infarction due to unspecified occlusion or stenosis of bilateral anterior cerebral arteries: Secondary | ICD-10-CM | POA: Diagnosis not present

## 2020-11-08 HISTORY — DX: Cerebral infarction, unspecified: I63.9

## 2020-11-10 DIAGNOSIS — I639 Cerebral infarction, unspecified: Secondary | ICD-10-CM | POA: Insufficient documentation

## 2020-11-10 DIAGNOSIS — Z8673 Personal history of transient ischemic attack (TIA), and cerebral infarction without residual deficits: Secondary | ICD-10-CM | POA: Insufficient documentation

## 2020-11-11 DIAGNOSIS — F1490 Cocaine use, unspecified, uncomplicated: Secondary | ICD-10-CM | POA: Diagnosis not present

## 2020-11-11 DIAGNOSIS — G8929 Other chronic pain: Secondary | ICD-10-CM | POA: Diagnosis not present

## 2020-11-11 DIAGNOSIS — I63522 Cerebral infarction due to unspecified occlusion or stenosis of left anterior cerebral artery: Secondary | ICD-10-CM | POA: Diagnosis not present

## 2020-11-11 DIAGNOSIS — M1990 Unspecified osteoarthritis, unspecified site: Secondary | ICD-10-CM | POA: Diagnosis not present

## 2020-11-11 DIAGNOSIS — I63322 Cerebral infarction due to thrombosis of left anterior cerebral artery: Secondary | ICD-10-CM | POA: Diagnosis not present

## 2020-11-11 DIAGNOSIS — R5381 Other malaise: Secondary | ICD-10-CM | POA: Diagnosis not present

## 2020-11-11 DIAGNOSIS — R279 Unspecified lack of coordination: Secondary | ICD-10-CM | POA: Diagnosis not present

## 2020-11-11 DIAGNOSIS — M48061 Spinal stenosis, lumbar region without neurogenic claudication: Secondary | ICD-10-CM | POA: Diagnosis not present

## 2020-11-11 DIAGNOSIS — I639 Cerebral infarction, unspecified: Secondary | ICD-10-CM | POA: Diagnosis not present

## 2020-11-11 DIAGNOSIS — R4701 Aphasia: Secondary | ICD-10-CM | POA: Diagnosis not present

## 2020-11-11 DIAGNOSIS — I69392 Facial weakness following cerebral infarction: Secondary | ICD-10-CM | POA: Diagnosis not present

## 2020-11-11 DIAGNOSIS — I69351 Hemiplegia and hemiparesis following cerebral infarction affecting right dominant side: Secondary | ICD-10-CM | POA: Diagnosis not present

## 2020-11-11 DIAGNOSIS — F410 Panic disorder [episodic paroxysmal anxiety] without agoraphobia: Secondary | ICD-10-CM | POA: Diagnosis not present

## 2020-11-11 DIAGNOSIS — M545 Low back pain, unspecified: Secondary | ICD-10-CM | POA: Diagnosis not present

## 2020-11-11 DIAGNOSIS — R937 Abnormal findings on diagnostic imaging of other parts of musculoskeletal system: Secondary | ICD-10-CM | POA: Diagnosis not present

## 2020-11-11 DIAGNOSIS — F1290 Cannabis use, unspecified, uncomplicated: Secondary | ICD-10-CM | POA: Diagnosis not present

## 2020-11-11 DIAGNOSIS — I69320 Aphasia following cerebral infarction: Secondary | ICD-10-CM | POA: Diagnosis not present

## 2020-11-11 DIAGNOSIS — E785 Hyperlipidemia, unspecified: Secondary | ICD-10-CM | POA: Diagnosis not present

## 2020-11-11 DIAGNOSIS — F1410 Cocaine abuse, uncomplicated: Secondary | ICD-10-CM | POA: Diagnosis not present

## 2020-11-11 DIAGNOSIS — L89513 Pressure ulcer of right ankle, stage 3: Secondary | ICD-10-CM | POA: Diagnosis not present

## 2020-11-11 DIAGNOSIS — F209 Schizophrenia, unspecified: Secondary | ICD-10-CM | POA: Diagnosis not present

## 2020-11-11 DIAGNOSIS — F419 Anxiety disorder, unspecified: Secondary | ICD-10-CM | POA: Diagnosis not present

## 2020-11-11 DIAGNOSIS — I63512 Cerebral infarction due to unspecified occlusion or stenosis of left middle cerebral artery: Secondary | ICD-10-CM | POA: Diagnosis not present

## 2020-11-11 DIAGNOSIS — G4700 Insomnia, unspecified: Secondary | ICD-10-CM | POA: Diagnosis not present

## 2020-11-11 DIAGNOSIS — M5459 Other low back pain: Secondary | ICD-10-CM | POA: Diagnosis not present

## 2020-11-11 DIAGNOSIS — M6281 Muscle weakness (generalized): Secondary | ICD-10-CM | POA: Diagnosis not present

## 2020-11-11 DIAGNOSIS — L89512 Pressure ulcer of right ankle, stage 2: Secondary | ICD-10-CM | POA: Diagnosis not present

## 2020-11-12 DIAGNOSIS — I69320 Aphasia following cerebral infarction: Secondary | ICD-10-CM | POA: Diagnosis not present

## 2020-11-12 DIAGNOSIS — M5459 Other low back pain: Secondary | ICD-10-CM | POA: Diagnosis not present

## 2020-11-12 DIAGNOSIS — F1410 Cocaine abuse, uncomplicated: Secondary | ICD-10-CM | POA: Diagnosis not present

## 2020-11-12 DIAGNOSIS — I63522 Cerebral infarction due to unspecified occlusion or stenosis of left anterior cerebral artery: Secondary | ICD-10-CM | POA: Diagnosis not present

## 2020-11-12 DIAGNOSIS — I69392 Facial weakness following cerebral infarction: Secondary | ICD-10-CM | POA: Diagnosis not present

## 2020-11-12 DIAGNOSIS — I69351 Hemiplegia and hemiparesis following cerebral infarction affecting right dominant side: Secondary | ICD-10-CM | POA: Diagnosis not present

## 2020-11-13 DIAGNOSIS — M5459 Other low back pain: Secondary | ICD-10-CM | POA: Diagnosis not present

## 2020-11-13 DIAGNOSIS — G8929 Other chronic pain: Secondary | ICD-10-CM | POA: Diagnosis not present

## 2020-11-13 DIAGNOSIS — I63512 Cerebral infarction due to unspecified occlusion or stenosis of left middle cerebral artery: Secondary | ICD-10-CM | POA: Diagnosis not present

## 2020-11-13 DIAGNOSIS — I63322 Cerebral infarction due to thrombosis of left anterior cerebral artery: Secondary | ICD-10-CM | POA: Diagnosis not present

## 2020-11-13 DIAGNOSIS — R4701 Aphasia: Secondary | ICD-10-CM | POA: Diagnosis not present

## 2020-11-14 DIAGNOSIS — G8929 Other chronic pain: Secondary | ICD-10-CM | POA: Diagnosis not present

## 2020-11-14 DIAGNOSIS — I63512 Cerebral infarction due to unspecified occlusion or stenosis of left middle cerebral artery: Secondary | ICD-10-CM | POA: Diagnosis not present

## 2020-11-14 DIAGNOSIS — M5459 Other low back pain: Secondary | ICD-10-CM | POA: Diagnosis not present

## 2020-11-14 DIAGNOSIS — I63322 Cerebral infarction due to thrombosis of left anterior cerebral artery: Secondary | ICD-10-CM | POA: Diagnosis not present

## 2020-11-14 DIAGNOSIS — R4701 Aphasia: Secondary | ICD-10-CM | POA: Diagnosis not present

## 2020-11-15 DIAGNOSIS — I63322 Cerebral infarction due to thrombosis of left anterior cerebral artery: Secondary | ICD-10-CM | POA: Diagnosis not present

## 2020-11-15 DIAGNOSIS — I63512 Cerebral infarction due to unspecified occlusion or stenosis of left middle cerebral artery: Secondary | ICD-10-CM | POA: Diagnosis not present

## 2020-11-15 DIAGNOSIS — R4701 Aphasia: Secondary | ICD-10-CM | POA: Diagnosis not present

## 2020-11-15 DIAGNOSIS — G8929 Other chronic pain: Secondary | ICD-10-CM | POA: Diagnosis not present

## 2020-11-15 DIAGNOSIS — M5459 Other low back pain: Secondary | ICD-10-CM | POA: Diagnosis not present

## 2020-11-16 ENCOUNTER — Ambulatory Visit: Payer: Medicare HMO | Admitting: Family Medicine

## 2020-11-16 DIAGNOSIS — R4701 Aphasia: Secondary | ICD-10-CM | POA: Diagnosis not present

## 2020-11-16 DIAGNOSIS — I63322 Cerebral infarction due to thrombosis of left anterior cerebral artery: Secondary | ICD-10-CM | POA: Diagnosis not present

## 2020-11-16 DIAGNOSIS — G8929 Other chronic pain: Secondary | ICD-10-CM | POA: Diagnosis not present

## 2020-11-16 DIAGNOSIS — I63512 Cerebral infarction due to unspecified occlusion or stenosis of left middle cerebral artery: Secondary | ICD-10-CM | POA: Diagnosis not present

## 2020-11-16 DIAGNOSIS — M5459 Other low back pain: Secondary | ICD-10-CM | POA: Diagnosis not present

## 2020-11-16 NOTE — Progress Notes (Deleted)
Established patient visit   Patient: David Reeves   DOB: 10-22-1970   50 y.o. Male  MRN: [id number] Visit Date: 11/16/2020  Today's healthcare provider: Dortha Kern, PA-C   No chief complaint on file.  Subjective    HPI  ***  Patient Active Problem List   Diagnosis Date Noted   Schizophrenia (HCC) 05/14/2017   Laceration of right hand without foreign body    Cannabis use disorder, moderate, dependence (HCC) 05/09/2017   Closed compression fracture of L1 lumbar vertebra 05/24/2016   Lumbar stenosis with neurogenic claudication 11/01/2015   Chronic 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 11/08/2014   Hx of hemorrhoids 11/08/2014   AAA (abdominal aortic aneurysm) (HCC) 09/22/2014   Chronic pain associated with significant psychosocial dysfunction 09/22/2014   Panic attack 09/22/2014   AB (asthmatic bronchitis) 08/17/2014   Anxiety 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 08/17/2014   Backache 08/17/2014   Lumbosacral spondylosis without myelopathy 08/17/2014   Disorder of male genital organs 08/17/2014   Brash 08/17/2014   Low back pain 08/17/2014   Tendon nodule 08/17/2014   Episodic paroxysmal anxiety disorder 08/17/2014   Hernia, inguinal, right 08/17/2014   Fast heart beat 08/17/2014   Illness 08/17/2014   Inguinal hernia 10/14/2012   Past Medical History:  Diagnosis Date   Back pain 2014   Bone spur    Bulging disc 2014   Degenerative disc disease    Osteoarthritis    Panic anxiety syndrome    Taking multiple medications for chronic disease    Allergies  Allergen Reactions   Duloxetine     Other reaction(s): Other (See Comments) Increased temperature, sweating, uncontrolled shaking, aggressive thoughts   Amitriptyline Other (See Comments)    Urine retention   Buspirone Other (See Comments)    Kidney pain   Ciprofloxacin Other (See Comments)    hallucinations   Doxepin Other (See Comments)    "Didn't feel right"    Escitalopram Other (See Comments)    Agitation   Olanzapine    Penicillins Other (See Comments)    Unknown -- childhood reaction   Sertraline Other (See Comments)    Urine retention   Trazodone Other (See Comments)    abd pain, urine retention       Medications: Outpatient Medications Prior to Visit  Medication Sig   Cholecalciferol (VITAMIN D3) 2000 units TABS Take by mouth.   clonazePAM (KLONOPIN) 1 MG tablet Take 1 tablet (1 mg total) by mouth 2 (two) times daily as needed. for anxiety (Patient not taking: Reported on 02/14/2020)   HYDROcodone-acetaminophen (NORCO) 7.5-325 MG tablet Take 1 tablet by mouth 2 (two) times daily as needed. (Patient not taking: Reported on 02/14/2020)   meloxicam (MOBIC) 15 MG tablet Take 1/2 tablet by mouth twice daily.   Multiple Vitamin (MULTIVITAMIN) tablet Take 1 tablet by mouth daily.   Suvorexant (BELSOMRA) 5 MG TABS Take 1 tablet by mouth at bedtime as needed.   tiZANidine (ZANAFLEX) 4 MG tablet TAKE 1 TABLET(4 MG) BY MOUTH THREE TIMES DAILY   UNABLE TO FIND cbd   No facility-administered medications prior to visit.    Review of Systems      Objective    There were no vitals taken for this visit. BP Readings from Last 3 Encounters:  02/14/20 114/78  07/06/19 122/84  04/05/19 130/86   Wt Readings from Last 3 Encounters:  02/14/20 225 lb (102.1 kg)  07/06/19 221 lb (100.2 kg)  04/05/19  235 lb (106.6 kg)       Physical Exam  ***  No results found for any visits on 11/16/20.  Assessment & Plan     ***  No follow-ups on file.      {provider attestation***:1}   Dortha Kern, Cordelia Poche  [facility] [phone] (phone) [phone] (fax)  [facility]

## 2020-11-17 DIAGNOSIS — R4701 Aphasia: Secondary | ICD-10-CM | POA: Diagnosis not present

## 2020-11-17 DIAGNOSIS — I63322 Cerebral infarction due to thrombosis of left anterior cerebral artery: Secondary | ICD-10-CM | POA: Diagnosis not present

## 2020-11-17 DIAGNOSIS — M5459 Other low back pain: Secondary | ICD-10-CM | POA: Diagnosis not present

## 2020-11-17 DIAGNOSIS — G8929 Other chronic pain: Secondary | ICD-10-CM | POA: Diagnosis not present

## 2020-11-17 DIAGNOSIS — I63512 Cerebral infarction due to unspecified occlusion or stenosis of left middle cerebral artery: Secondary | ICD-10-CM | POA: Diagnosis not present

## 2020-11-18 DIAGNOSIS — I63322 Cerebral infarction due to thrombosis of left anterior cerebral artery: Secondary | ICD-10-CM | POA: Diagnosis not present

## 2020-11-18 DIAGNOSIS — M5459 Other low back pain: Secondary | ICD-10-CM | POA: Diagnosis not present

## 2020-11-18 DIAGNOSIS — I63512 Cerebral infarction due to unspecified occlusion or stenosis of left middle cerebral artery: Secondary | ICD-10-CM | POA: Diagnosis not present

## 2020-11-18 DIAGNOSIS — R4701 Aphasia: Secondary | ICD-10-CM | POA: Diagnosis not present

## 2020-11-18 DIAGNOSIS — G8929 Other chronic pain: Secondary | ICD-10-CM | POA: Diagnosis not present

## 2020-11-19 DIAGNOSIS — I63512 Cerebral infarction due to unspecified occlusion or stenosis of left middle cerebral artery: Secondary | ICD-10-CM | POA: Diagnosis not present

## 2020-11-19 DIAGNOSIS — R4701 Aphasia: Secondary | ICD-10-CM | POA: Diagnosis not present

## 2020-11-19 DIAGNOSIS — I63322 Cerebral infarction due to thrombosis of left anterior cerebral artery: Secondary | ICD-10-CM | POA: Diagnosis not present

## 2020-11-19 DIAGNOSIS — G8929 Other chronic pain: Secondary | ICD-10-CM | POA: Diagnosis not present

## 2020-11-19 DIAGNOSIS — M5459 Other low back pain: Secondary | ICD-10-CM | POA: Diagnosis not present

## 2020-11-20 DIAGNOSIS — R4701 Aphasia: Secondary | ICD-10-CM | POA: Diagnosis not present

## 2020-11-20 DIAGNOSIS — M5459 Other low back pain: Secondary | ICD-10-CM | POA: Diagnosis not present

## 2020-11-20 DIAGNOSIS — I63512 Cerebral infarction due to unspecified occlusion or stenosis of left middle cerebral artery: Secondary | ICD-10-CM | POA: Diagnosis not present

## 2020-11-20 DIAGNOSIS — R937 Abnormal findings on diagnostic imaging of other parts of musculoskeletal system: Secondary | ICD-10-CM | POA: Diagnosis not present

## 2020-11-20 DIAGNOSIS — I63322 Cerebral infarction due to thrombosis of left anterior cerebral artery: Secondary | ICD-10-CM | POA: Diagnosis not present

## 2020-11-20 DIAGNOSIS — G8929 Other chronic pain: Secondary | ICD-10-CM | POA: Diagnosis not present

## 2020-11-21 DIAGNOSIS — M1990 Unspecified osteoarthritis, unspecified site: Secondary | ICD-10-CM | POA: Diagnosis not present

## 2020-11-21 DIAGNOSIS — L89512 Pressure ulcer of right ankle, stage 2: Secondary | ICD-10-CM | POA: Diagnosis not present

## 2020-11-21 DIAGNOSIS — I63512 Cerebral infarction due to unspecified occlusion or stenosis of left middle cerebral artery: Secondary | ICD-10-CM | POA: Diagnosis not present

## 2020-11-21 DIAGNOSIS — M5459 Other low back pain: Secondary | ICD-10-CM | POA: Diagnosis not present

## 2020-11-21 DIAGNOSIS — R4701 Aphasia: Secondary | ICD-10-CM | POA: Diagnosis not present

## 2020-11-21 DIAGNOSIS — I63322 Cerebral infarction due to thrombosis of left anterior cerebral artery: Secondary | ICD-10-CM | POA: Diagnosis not present

## 2020-11-21 DIAGNOSIS — G8929 Other chronic pain: Secondary | ICD-10-CM | POA: Diagnosis not present

## 2020-11-22 DIAGNOSIS — R4701 Aphasia: Secondary | ICD-10-CM | POA: Diagnosis not present

## 2020-11-22 DIAGNOSIS — M5459 Other low back pain: Secondary | ICD-10-CM | POA: Diagnosis not present

## 2020-11-22 DIAGNOSIS — I63512 Cerebral infarction due to unspecified occlusion or stenosis of left middle cerebral artery: Secondary | ICD-10-CM | POA: Diagnosis not present

## 2020-11-22 DIAGNOSIS — G8929 Other chronic pain: Secondary | ICD-10-CM | POA: Diagnosis not present

## 2020-11-22 DIAGNOSIS — I63322 Cerebral infarction due to thrombosis of left anterior cerebral artery: Secondary | ICD-10-CM | POA: Diagnosis not present

## 2020-11-23 DIAGNOSIS — I63322 Cerebral infarction due to thrombosis of left anterior cerebral artery: Secondary | ICD-10-CM | POA: Diagnosis not present

## 2020-11-23 DIAGNOSIS — M5459 Other low back pain: Secondary | ICD-10-CM | POA: Diagnosis not present

## 2020-11-23 DIAGNOSIS — R4701 Aphasia: Secondary | ICD-10-CM | POA: Diagnosis not present

## 2020-11-23 DIAGNOSIS — G8929 Other chronic pain: Secondary | ICD-10-CM | POA: Diagnosis not present

## 2020-11-23 DIAGNOSIS — I63512 Cerebral infarction due to unspecified occlusion or stenosis of left middle cerebral artery: Secondary | ICD-10-CM | POA: Diagnosis not present

## 2020-11-24 DIAGNOSIS — M5459 Other low back pain: Secondary | ICD-10-CM | POA: Diagnosis not present

## 2020-11-24 DIAGNOSIS — G8929 Other chronic pain: Secondary | ICD-10-CM | POA: Diagnosis not present

## 2020-11-24 DIAGNOSIS — I63322 Cerebral infarction due to thrombosis of left anterior cerebral artery: Secondary | ICD-10-CM | POA: Diagnosis not present

## 2020-11-24 DIAGNOSIS — I63512 Cerebral infarction due to unspecified occlusion or stenosis of left middle cerebral artery: Secondary | ICD-10-CM | POA: Diagnosis not present

## 2020-11-24 DIAGNOSIS — R4701 Aphasia: Secondary | ICD-10-CM | POA: Diagnosis not present

## 2020-11-28 ENCOUNTER — Telehealth: Payer: Self-pay | Admitting: Family Medicine

## 2020-11-28 DIAGNOSIS — Z9181 History of falling: Secondary | ICD-10-CM | POA: Diagnosis not present

## 2020-11-28 DIAGNOSIS — I6781 Acute cerebrovascular insufficiency: Secondary | ICD-10-CM | POA: Diagnosis not present

## 2020-11-28 DIAGNOSIS — Z79899 Other long term (current) drug therapy: Secondary | ICD-10-CM | POA: Diagnosis not present

## 2020-11-28 DIAGNOSIS — M21371 Foot drop, right foot: Secondary | ICD-10-CM | POA: Diagnosis not present

## 2020-11-28 DIAGNOSIS — G894 Chronic pain syndrome: Secondary | ICD-10-CM | POA: Diagnosis not present

## 2020-11-28 DIAGNOSIS — F5101 Primary insomnia: Secondary | ICD-10-CM | POA: Diagnosis not present

## 2020-11-28 DIAGNOSIS — I69398 Other sequelae of cerebral infarction: Secondary | ICD-10-CM | POA: Diagnosis not present

## 2020-11-28 DIAGNOSIS — M48061 Spinal stenosis, lumbar region without neurogenic claudication: Secondary | ICD-10-CM | POA: Diagnosis not present

## 2020-11-28 NOTE — Telephone Encounter (Signed)
Home Health Verbal Orders - Caller/Agency: Darnelle Spangle Callback Number: [phone] Requesting OT/PT/Skilled Nursing/Social Work/Speech Therapy: PT  Frequency:  2w4  1w2   David Reeves scheduled a hosp fu

## 2020-11-28 NOTE — Telephone Encounter (Signed)
Proceed with orders as above post stroke home health therapy.

## 2020-11-29 DIAGNOSIS — Z9181 History of falling: Secondary | ICD-10-CM | POA: Diagnosis not present

## 2020-11-29 DIAGNOSIS — F5101 Primary insomnia: Secondary | ICD-10-CM | POA: Diagnosis not present

## 2020-11-29 DIAGNOSIS — Z79899 Other long term (current) drug therapy: Secondary | ICD-10-CM | POA: Diagnosis not present

## 2020-11-29 DIAGNOSIS — M21371 Foot drop, right foot: Secondary | ICD-10-CM | POA: Diagnosis not present

## 2020-11-29 DIAGNOSIS — M48061 Spinal stenosis, lumbar region without neurogenic claudication: Secondary | ICD-10-CM | POA: Diagnosis not present

## 2020-11-29 DIAGNOSIS — I69398 Other sequelae of cerebral infarction: Secondary | ICD-10-CM | POA: Diagnosis not present

## 2020-11-29 DIAGNOSIS — I6781 Acute cerebrovascular insufficiency: Secondary | ICD-10-CM | POA: Diagnosis not present

## 2020-11-29 DIAGNOSIS — G894 Chronic pain syndrome: Secondary | ICD-10-CM | POA: Diagnosis not present

## 2020-11-29 DIAGNOSIS — I63512 Cerebral infarction due to unspecified occlusion or stenosis of left middle cerebral artery: Secondary | ICD-10-CM | POA: Diagnosis not present

## 2020-11-30 ENCOUNTER — Telehealth: Payer: Self-pay

## 2020-11-30 NOTE — Telephone Encounter (Signed)
Please advise orders? 

## 2020-11-30 NOTE — Telephone Encounter (Signed)
Copied from CRM [id number]. Topic: General - Inquiry >> Nov 30, 2020 10:19 AM Elliot Gault wrote: Judeth Cornfield, occupational therapist from [facility] phone # [phone]  Requesting OT orders for 1x 1, 2x 2 and 1x 1

## 2020-11-30 NOTE — Telephone Encounter (Signed)
David Reeves with [facility] called on number listed, no answer, voicemail is full, recording left to call the office at [phone] for immediate assistance. [facility] office called and spoke to [place], Systems developer who says she will send an alert to [facility] for a return call to [facility] for the orders.

## 2020-12-01 DIAGNOSIS — M48061 Spinal stenosis, lumbar region without neurogenic claudication: Secondary | ICD-10-CM | POA: Diagnosis not present

## 2020-12-01 DIAGNOSIS — I69398 Other sequelae of cerebral infarction: Secondary | ICD-10-CM | POA: Diagnosis not present

## 2020-12-01 DIAGNOSIS — M21371 Foot drop, right foot: Secondary | ICD-10-CM | POA: Diagnosis not present

## 2020-12-01 DIAGNOSIS — F5101 Primary insomnia: Secondary | ICD-10-CM | POA: Diagnosis not present

## 2020-12-01 DIAGNOSIS — Z9181 History of falling: Secondary | ICD-10-CM | POA: Diagnosis not present

## 2020-12-01 DIAGNOSIS — Z79899 Other long term (current) drug therapy: Secondary | ICD-10-CM | POA: Diagnosis not present

## 2020-12-01 DIAGNOSIS — G894 Chronic pain syndrome: Secondary | ICD-10-CM | POA: Diagnosis not present

## 2020-12-01 DIAGNOSIS — I6781 Acute cerebrovascular insufficiency: Secondary | ICD-10-CM | POA: Diagnosis not present

## 2020-12-01 NOTE — Telephone Encounter (Signed)
Left detailed message giving verbal okay. 

## 2020-12-01 NOTE — Telephone Encounter (Signed)
Christen Bame with [facility] called on number listed, no answer, voicemail is full, recording left to call the office at [phone] for immediate assistance. [facility] office called and spoke to [place], Systems developer who says she will send another alert to [facility] for a return call to [facility] for the orders and that her voicemail is full.

## 2020-12-04 DIAGNOSIS — M21371 Foot drop, right foot: Secondary | ICD-10-CM | POA: Diagnosis not present

## 2020-12-04 DIAGNOSIS — G894 Chronic pain syndrome: Secondary | ICD-10-CM | POA: Diagnosis not present

## 2020-12-04 DIAGNOSIS — Z9181 History of falling: Secondary | ICD-10-CM | POA: Diagnosis not present

## 2020-12-04 DIAGNOSIS — M48061 Spinal stenosis, lumbar region without neurogenic claudication: Secondary | ICD-10-CM | POA: Diagnosis not present

## 2020-12-04 DIAGNOSIS — F5101 Primary insomnia: Secondary | ICD-10-CM | POA: Diagnosis not present

## 2020-12-04 DIAGNOSIS — I6781 Acute cerebrovascular insufficiency: Secondary | ICD-10-CM | POA: Diagnosis not present

## 2020-12-04 DIAGNOSIS — I69398 Other sequelae of cerebral infarction: Secondary | ICD-10-CM | POA: Diagnosis not present

## 2020-12-04 DIAGNOSIS — Z79899 Other long term (current) drug therapy: Secondary | ICD-10-CM | POA: Diagnosis not present

## 2020-12-05 DIAGNOSIS — M48061 Spinal stenosis, lumbar region without neurogenic claudication: Secondary | ICD-10-CM | POA: Diagnosis not present

## 2020-12-05 DIAGNOSIS — F5101 Primary insomnia: Secondary | ICD-10-CM | POA: Diagnosis not present

## 2020-12-05 DIAGNOSIS — Z9181 History of falling: Secondary | ICD-10-CM | POA: Diagnosis not present

## 2020-12-05 DIAGNOSIS — Z79899 Other long term (current) drug therapy: Secondary | ICD-10-CM | POA: Diagnosis not present

## 2020-12-05 DIAGNOSIS — I6781 Acute cerebrovascular insufficiency: Secondary | ICD-10-CM | POA: Diagnosis not present

## 2020-12-05 DIAGNOSIS — I69398 Other sequelae of cerebral infarction: Secondary | ICD-10-CM | POA: Diagnosis not present

## 2020-12-05 DIAGNOSIS — G894 Chronic pain syndrome: Secondary | ICD-10-CM | POA: Diagnosis not present

## 2020-12-05 DIAGNOSIS — M21371 Foot drop, right foot: Secondary | ICD-10-CM | POA: Diagnosis not present

## 2020-12-06 DIAGNOSIS — M48061 Spinal stenosis, lumbar region without neurogenic claudication: Secondary | ICD-10-CM | POA: Diagnosis not present

## 2020-12-06 DIAGNOSIS — I69398 Other sequelae of cerebral infarction: Secondary | ICD-10-CM | POA: Diagnosis not present

## 2020-12-06 DIAGNOSIS — I6781 Acute cerebrovascular insufficiency: Secondary | ICD-10-CM | POA: Diagnosis not present

## 2020-12-06 DIAGNOSIS — F5101 Primary insomnia: Secondary | ICD-10-CM | POA: Diagnosis not present

## 2020-12-06 DIAGNOSIS — Z79899 Other long term (current) drug therapy: Secondary | ICD-10-CM | POA: Diagnosis not present

## 2020-12-06 DIAGNOSIS — G894 Chronic pain syndrome: Secondary | ICD-10-CM | POA: Diagnosis not present

## 2020-12-06 DIAGNOSIS — M21371 Foot drop, right foot: Secondary | ICD-10-CM | POA: Diagnosis not present

## 2020-12-06 DIAGNOSIS — Z9181 History of falling: Secondary | ICD-10-CM | POA: Diagnosis not present

## 2020-12-07 DIAGNOSIS — I69398 Other sequelae of cerebral infarction: Secondary | ICD-10-CM | POA: Diagnosis not present

## 2020-12-07 DIAGNOSIS — M21371 Foot drop, right foot: Secondary | ICD-10-CM | POA: Diagnosis not present

## 2020-12-07 DIAGNOSIS — I6781 Acute cerebrovascular insufficiency: Secondary | ICD-10-CM | POA: Diagnosis not present

## 2020-12-07 DIAGNOSIS — M48061 Spinal stenosis, lumbar region without neurogenic claudication: Secondary | ICD-10-CM | POA: Diagnosis not present

## 2020-12-07 DIAGNOSIS — G894 Chronic pain syndrome: Secondary | ICD-10-CM | POA: Diagnosis not present

## 2020-12-07 DIAGNOSIS — F5101 Primary insomnia: Secondary | ICD-10-CM | POA: Diagnosis not present

## 2020-12-07 DIAGNOSIS — Z9181 History of falling: Secondary | ICD-10-CM | POA: Diagnosis not present

## 2020-12-07 DIAGNOSIS — Z79899 Other long term (current) drug therapy: Secondary | ICD-10-CM | POA: Diagnosis not present

## 2020-12-08 DIAGNOSIS — F5101 Primary insomnia: Secondary | ICD-10-CM | POA: Diagnosis not present

## 2020-12-08 DIAGNOSIS — Z79899 Other long term (current) drug therapy: Secondary | ICD-10-CM | POA: Diagnosis not present

## 2020-12-08 DIAGNOSIS — M48061 Spinal stenosis, lumbar region without neurogenic claudication: Secondary | ICD-10-CM | POA: Diagnosis not present

## 2020-12-08 DIAGNOSIS — G894 Chronic pain syndrome: Secondary | ICD-10-CM | POA: Diagnosis not present

## 2020-12-08 DIAGNOSIS — I69398 Other sequelae of cerebral infarction: Secondary | ICD-10-CM | POA: Diagnosis not present

## 2020-12-08 DIAGNOSIS — M21371 Foot drop, right foot: Secondary | ICD-10-CM | POA: Diagnosis not present

## 2020-12-08 DIAGNOSIS — Z9181 History of falling: Secondary | ICD-10-CM | POA: Diagnosis not present

## 2020-12-08 DIAGNOSIS — I6781 Acute cerebrovascular insufficiency: Secondary | ICD-10-CM | POA: Diagnosis not present

## 2020-12-11 DIAGNOSIS — Z9181 History of falling: Secondary | ICD-10-CM | POA: Diagnosis not present

## 2020-12-11 DIAGNOSIS — F5101 Primary insomnia: Secondary | ICD-10-CM | POA: Diagnosis not present

## 2020-12-11 DIAGNOSIS — M21371 Foot drop, right foot: Secondary | ICD-10-CM | POA: Diagnosis not present

## 2020-12-11 DIAGNOSIS — Z79899 Other long term (current) drug therapy: Secondary | ICD-10-CM | POA: Diagnosis not present

## 2020-12-11 DIAGNOSIS — M48061 Spinal stenosis, lumbar region without neurogenic claudication: Secondary | ICD-10-CM | POA: Diagnosis not present

## 2020-12-11 DIAGNOSIS — I6781 Acute cerebrovascular insufficiency: Secondary | ICD-10-CM | POA: Diagnosis not present

## 2020-12-11 DIAGNOSIS — I69398 Other sequelae of cerebral infarction: Secondary | ICD-10-CM | POA: Diagnosis not present

## 2020-12-11 DIAGNOSIS — G894 Chronic pain syndrome: Secondary | ICD-10-CM | POA: Diagnosis not present

## 2020-12-12 ENCOUNTER — Ambulatory Visit: Payer: Medicare HMO | Admitting: Occupational Therapy

## 2020-12-12 ENCOUNTER — Ambulatory Visit: Payer: Medicare HMO | Admitting: Speech Pathology

## 2020-12-12 DIAGNOSIS — G894 Chronic pain syndrome: Secondary | ICD-10-CM | POA: Diagnosis not present

## 2020-12-12 DIAGNOSIS — I6781 Acute cerebrovascular insufficiency: Secondary | ICD-10-CM | POA: Diagnosis not present

## 2020-12-12 DIAGNOSIS — I69398 Other sequelae of cerebral infarction: Secondary | ICD-10-CM | POA: Diagnosis not present

## 2020-12-12 DIAGNOSIS — M21371 Foot drop, right foot: Secondary | ICD-10-CM | POA: Diagnosis not present

## 2020-12-12 DIAGNOSIS — F5101 Primary insomnia: Secondary | ICD-10-CM | POA: Diagnosis not present

## 2020-12-12 DIAGNOSIS — Z9181 History of falling: Secondary | ICD-10-CM | POA: Diagnosis not present

## 2020-12-12 DIAGNOSIS — Z79899 Other long term (current) drug therapy: Secondary | ICD-10-CM | POA: Diagnosis not present

## 2020-12-12 DIAGNOSIS — M48061 Spinal stenosis, lumbar region without neurogenic claudication: Secondary | ICD-10-CM | POA: Diagnosis not present

## 2020-12-13 ENCOUNTER — Ambulatory Visit: Payer: Medicare HMO

## 2020-12-13 DIAGNOSIS — M21371 Foot drop, right foot: Secondary | ICD-10-CM | POA: Diagnosis not present

## 2020-12-13 DIAGNOSIS — I6781 Acute cerebrovascular insufficiency: Secondary | ICD-10-CM | POA: Diagnosis not present

## 2020-12-13 DIAGNOSIS — F5101 Primary insomnia: Secondary | ICD-10-CM | POA: Diagnosis not present

## 2020-12-13 DIAGNOSIS — I69398 Other sequelae of cerebral infarction: Secondary | ICD-10-CM | POA: Diagnosis not present

## 2020-12-13 DIAGNOSIS — Z9181 History of falling: Secondary | ICD-10-CM | POA: Diagnosis not present

## 2020-12-13 DIAGNOSIS — M48061 Spinal stenosis, lumbar region without neurogenic claudication: Secondary | ICD-10-CM | POA: Diagnosis not present

## 2020-12-13 DIAGNOSIS — Z79899 Other long term (current) drug therapy: Secondary | ICD-10-CM | POA: Diagnosis not present

## 2020-12-13 DIAGNOSIS — G894 Chronic pain syndrome: Secondary | ICD-10-CM | POA: Diagnosis not present

## 2020-12-13 NOTE — Telephone Encounter (Signed)
Order 4 wheel Rolator walker with a seat. 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

## 2020-12-13 NOTE — Telephone Encounter (Signed)
Donnie calling with [facility] stated that she would like an order for a 4 wheel walker (Rolator) The one with the seat on it. Please fax to [phone] Questions call [phone]  Please advise

## 2020-12-14 ENCOUNTER — Other Ambulatory Visit: Payer: Self-pay

## 2020-12-14 ENCOUNTER — Ambulatory Visit (INDEPENDENT_AMBULATORY_CARE_PROVIDER_SITE_OTHER): Payer: Medicare HMO | Admitting: Family Medicine

## 2020-12-14 VITALS — BP 147/109 | HR 88 | Temp 99.0°F | Wt 219.0 lb

## 2020-12-14 DIAGNOSIS — I10 Essential (primary) hypertension: Secondary | ICD-10-CM | POA: Diagnosis not present

## 2020-12-14 DIAGNOSIS — E7800 Pure hypercholesterolemia, unspecified: Secondary | ICD-10-CM | POA: Diagnosis not present

## 2020-12-14 DIAGNOSIS — I63512 Cerebral infarction due to unspecified occlusion or stenosis of left middle cerebral artery: Secondary | ICD-10-CM

## 2020-12-14 DIAGNOSIS — Z79899 Other long term (current) drug therapy: Secondary | ICD-10-CM | POA: Diagnosis not present

## 2020-12-14 DIAGNOSIS — M21371 Foot drop, right foot: Secondary | ICD-10-CM | POA: Diagnosis not present

## 2020-12-14 DIAGNOSIS — F5101 Primary insomnia: Secondary | ICD-10-CM | POA: Diagnosis not present

## 2020-12-14 DIAGNOSIS — G894 Chronic pain syndrome: Secondary | ICD-10-CM | POA: Diagnosis not present

## 2020-12-14 DIAGNOSIS — I69398 Other sequelae of cerebral infarction: Secondary | ICD-10-CM | POA: Diagnosis not present

## 2020-12-14 DIAGNOSIS — M48061 Spinal stenosis, lumbar region without neurogenic claudication: Secondary | ICD-10-CM | POA: Diagnosis not present

## 2020-12-14 DIAGNOSIS — I6781 Acute cerebrovascular insufficiency: Secondary | ICD-10-CM | POA: Diagnosis not present

## 2020-12-14 DIAGNOSIS — Z9181 History of falling: Secondary | ICD-10-CM | POA: Diagnosis not present

## 2020-12-14 MED ORDER — AMLODIPINE BESYLATE 5 MG PO TABS: 5.0000 mg | ORAL_TABLET | Freq: Every day | ORAL | 3 refills | Status: DC

## 2020-12-14 NOTE — Telephone Encounter (Signed)
Patient has appointment today and order is added to today's note

## 2020-12-14 NOTE — Progress Notes (Signed)
Established patient visit   Patient: David Reeves   DOB: 08-12-1970   50 y.o. Male  MRN: [id number] Visit Date: 12/14/2020  Today's healthcare provider: Dortha Kern, PA-C   No chief complaint on file.  Subjective  -------------------------------------------------------------------------------------------------------------------- HPI  Follow up Hospitalization  Patient was admitted to [facility] on 11/08/2020 and discharged on 11/11/2020.  He was then admitted to [facility] from 11/11/20-11/24/2020 He was treated for Ischemic stroke. He reports good compliance with treatment. He reports this condition is improved. Patient reports he is having a good day today and in the past week he has had significant improvment ----------------------------------------------------------------------------------------- -   Past Medical History:  Diagnosis Date   Back pain 2014   Bone spur    Bulging disc 2014   Degenerative disc disease    Osteoarthritis    Panic anxiety syndrome    Taking multiple medications for chronic disease    Past Surgical History:  Procedure Laterality Date   CYSTECTOMY     head   HERNIA REPAIR Left 2006,2014   [facility]   TOE SURGERY Right 2007   Social History   Tobacco Use   Smoking status: Never   Smokeless tobacco: Never   Tobacco comments:    cbd    Vaping Use   Vaping Use: Never used  Substance Use Topics   Alcohol use: Not Currently    Comment: last used 12-22-17   Drug use: Not Currently    Types: Marijuana    Comment: October 14, 2017   Family Status  Relation Name Status   Mother  Deceased   Father  Deceased   Daughter  Alive   Mat Uncle  Alive   MGM  Deceased   MGF  Deceased   Allergies  Allergen Reactions   Duloxetine     Other reaction(s): Other (See Comments) Increased temperature, sweating, uncontrolled shaking, aggressive thoughts   Amitriptyline Other (See Comments)    Urine retention   Buspirone Other (See  Comments)    Kidney pain   Ciprofloxacin Other (See Comments)    hallucinations   Doxepin Other (See Comments)    "Didn't feel right"   Escitalopram Other (See Comments)    Agitation   Olanzapine    Penicillins Other (See Comments)    Unknown -- childhood reaction   Sertraline Other (See Comments)    Urine retention   Trazodone Other (See Comments)    abd pain, urine retention    Medications: Outpatient Medications Prior to Visit  Medication Sig   Cholecalciferol (VITAMIN D3) 2000 units TABS Take by mouth.   meloxicam (MOBIC) 15 MG tablet Take 1/2 tablet by mouth twice daily.   Multiple Vitamin (MULTIVITAMIN) tablet Take 1 tablet by mouth daily.   Suvorexant (BELSOMRA) 5 MG TABS Take 1 tablet by mouth at bedtime as needed.   tiZANidine (ZANAFLEX) 4 MG tablet TAKE 1 TABLET(4 MG) BY MOUTH THREE TIMES DAILY   UNABLE TO FIND cbd   clonazePAM (KLONOPIN) 1 MG tablet Take 1 tablet (1 mg total) by mouth 2 (two) times daily as needed. for anxiety   HYDROcodone-acetaminophen (NORCO) 7.5-325 MG tablet Take 1 tablet by mouth 2 (two) times daily as needed.   No facility-administered medications prior to visit.    Review of Systems  Respiratory:  Negative for shortness of breath.   Cardiovascular:  Positive for chest pain.  Neurological:  Positive for weakness. Negative for dizziness, tremors, numbness (tingling in right hand) and headaches.  Objective  -------------------------------------------------------------------------------------------------------------------- BP (!) 147/109 (BP Location: Left Arm, Patient Position: Sitting, Cuff Size: Large)   Pulse 88   Temp 99 F (37.2 C) (Oral)   Wt 219 lb (99.3 kg)   SpO2 98%   BMI 28.89 kg/m  BP Readings from Last 3 Encounters:  12/14/20 (!) 147/109  02/14/20 114/78  07/06/19 122/84   Wt Readings from Last 3 Encounters: 12/14/20 219 lb (99.3 kg) 02/14/20 225 lb (102.1 kg) 07/06/19 221 lb (100.2 kg)   Physical  Exam Constitutional:      General: He is not in acute distress.    Appearance: He is well-developed.  HENT:     Head: Normocephalic and atraumatic.     Right Ear: Hearing and tympanic membrane normal.     Left Ear: Hearing and tympanic membrane normal.     Nose: Nose normal.  Eyes:     General: Lids are normal. No scleral icterus.       Right eye: No discharge.        Left eye: No discharge.     Conjunctiva/sclera: Conjunctivae normal.  Cardiovascular:     Rate and Rhythm: Normal rate and regular rhythm.     Pulses: Normal pulses.     Heart sounds: Normal heart sounds.  Pulmonary:     Effort: Pulmonary effort is normal. No respiratory distress.     Breath sounds: Normal breath sounds.  Abdominal:     General: Bowel sounds are normal.     Palpations: Abdomen is soft.  Musculoskeletal:        General: Normal range of motion.  Skin:    Findings: No lesion or rash.  Neurological:     Mental Status: He is alert and oriented to person, place, and time.     Motor: Weakness present.     Comments: Right sided weakness from recent stroke is improving. Expressive aphasia is frustrating but starting to improve with therapy.   Psychiatric:        Speech: Speech normal.        Behavior: Behavior normal.        Thought Content: Thought content normal.      No results found for any visits on 12/14/20.  Assessment & Plan  ---------------------------------------------------------------------------------------------------------------------- 1. Cerebrovascular accident (CVA) due to occlusion of left middle cerebral artery (HCC) Tingling and weakness in the right hand slowly improving. Order a Rollator walker for ambulation with some clumsiness with gait. Frustrated with expressive aphasia but wife feels he is slowly improving with therapy. Check labs and maintain follow up with Dr. Sabino Niemann (neurologist at [facility]). - For home use only DME 4 wheeled rolling walker with seat ([id number]) - CBC with  Differential/Platelet - Comprehensive metabolic panel - Lipid panel  2. Primary hypertension BP high today. Add Amlodipine 5 mg qd and advised them to check BP at home to monitor progress. Check labs and follow up pending reports. - CBC with Differential/Platelet - Comprehensive metabolic panel - Lipid panel - amLODipine (NORVASC) 5 MG tablet; Take 1 tablet (5 mg total) by mouth daily.  Dispense: 30 tablet; Refill: 3  3. Elevated LDL cholesterol level LDL 944 with HDL 21 on 11-08-20. Recommend low fat diet and consider statin with history of recent CVA. Recheck lipid panel and CMP. - Comprehensive metabolic panel - Lipid panel   No follow-ups on file.      I, Nera Haworth, PA-C, have reviewed all documentation for this visit. The documentation on 12/14/20 for the exam, diagnosis, procedures,  and orders are all accurate and complete.    Dortha Kern, PA-C  [facility] [phone] (phone) [phone] (fax)  [facility]

## 2020-12-15 ENCOUNTER — Telehealth: Payer: Self-pay

## 2020-12-15 DIAGNOSIS — I69398 Other sequelae of cerebral infarction: Secondary | ICD-10-CM | POA: Diagnosis not present

## 2020-12-15 DIAGNOSIS — M48061 Spinal stenosis, lumbar region without neurogenic claudication: Secondary | ICD-10-CM | POA: Diagnosis not present

## 2020-12-15 DIAGNOSIS — I6781 Acute cerebrovascular insufficiency: Secondary | ICD-10-CM | POA: Diagnosis not present

## 2020-12-15 DIAGNOSIS — Z79899 Other long term (current) drug therapy: Secondary | ICD-10-CM | POA: Diagnosis not present

## 2020-12-15 DIAGNOSIS — M21371 Foot drop, right foot: Secondary | ICD-10-CM | POA: Diagnosis not present

## 2020-12-15 DIAGNOSIS — Z9181 History of falling: Secondary | ICD-10-CM | POA: Diagnosis not present

## 2020-12-15 DIAGNOSIS — F5101 Primary insomnia: Secondary | ICD-10-CM | POA: Diagnosis not present

## 2020-12-15 DIAGNOSIS — G894 Chronic pain syndrome: Secondary | ICD-10-CM | POA: Diagnosis not present

## 2020-12-15 LAB — CBC WITH DIFFERENTIAL/PLATELET
Basophils Absolute: 0 10*3/uL (ref 0.0–0.2)
Basos: 1 %
EOS (ABSOLUTE): 0.1 10*3/uL (ref 0.0–0.4)
Eos: 1 %
Hematocrit: 47.2 % (ref 37.5–51.0)
Hemoglobin: 15.6 g/dL (ref 13.0–17.7)
Immature Grans (Abs): 0 10*3/uL (ref 0.0–0.1)
Immature Granulocytes: 0 %
Lymphocytes Absolute: 2.2 10*3/uL (ref 0.7–3.1)
Lymphs: 37 %
MCH: 31.5 pg (ref 26.6–33.0)
MCHC: 33.1 g/dL (ref 31.5–35.7)
MCV: 95 fL (ref 79–97)
Monocytes Absolute: 0.4 10*3/uL (ref 0.1–0.9)
Monocytes: 7 %
Neutrophils Absolute: 3.2 10*3/uL (ref 1.4–7.0)
Neutrophils: 54 %
Platelets: 322 10*3/uL (ref 150–450)
RBC: 4.96 x10E6/uL (ref 4.14–5.80)
RDW: 12.3 % (ref 11.6–15.4)
WBC: 6 10*3/uL (ref 3.4–10.8)

## 2020-12-15 LAB — COMPREHENSIVE METABOLIC PANEL
ALT: 25 IU/L (ref 0–44)
AST: 21 IU/L (ref 0–40)
Albumin/Globulin Ratio: 1.8 (ref 1.2–2.2)
Albumin: 4.8 g/dL (ref 4.0–5.0)
Alkaline Phosphatase: 68 IU/L (ref 44–121)
BUN/Creatinine Ratio: 15 (ref 9–20)
BUN: 12 mg/dL (ref 6–24)
Bilirubin Total: 0.9 mg/dL (ref 0.0–1.2)
CO2: 24 mmol/L (ref 20–29)
Calcium: 9.9 mg/dL (ref 8.7–10.2)
Chloride: 101 mmol/L (ref 96–106)
Creatinine, Ser: 0.8 mg/dL (ref 0.76–1.27)
Globulin, Total: 2.7 g/dL (ref 1.5–4.5)
Glucose: 94 mg/dL (ref 65–99)
Potassium: 4 mmol/L (ref 3.5–5.2)
Sodium: 141 mmol/L (ref 134–144)
Total Protein: 7.5 g/dL (ref 6.0–8.5)
eGFR: 108 mL/min/{1.73_m2} (ref >59)

## 2020-12-15 LAB — LIPID PANEL
Chol/HDL Ratio: 5.7 ratio — ABNORMAL HIGH (ref 0.0–5.0)
Cholesterol, Total: 246 mg/dL — ABNORMAL HIGH (ref 100–199)
HDL: 43 mg/dL (ref >39)
LDL Chol Calc (NIH): 179 mg/dL — ABNORMAL HIGH (ref 0–99)
Triglycerides: 129 mg/dL (ref 0–149)
VLDL Cholesterol Cal: 24 mg/dL (ref 5–40)

## 2020-12-15 NOTE — Telephone Encounter (Signed)
Copied from CRM [id number]. Topic: General - Other >> Dec 15, 2020  8:33 AM Elliot Gault wrote: Caller name: Gloriajean Dell  Relation to pt: from [facility]  Call back number: [phone]   Reason for call: Caller would like a call back regarding a signed form she received from patient  PCP requesting a walker. Caller unsure if PCP wanted to proceed or was the form just for there records/

## 2020-12-15 NOTE — Telephone Encounter (Signed)
Patient needs the Rolator walker with a seat due to his residual right sided weakness from CVA due to occlusion of the middle cerebral artery.

## 2020-12-17 ENCOUNTER — Other Ambulatory Visit: Payer: Self-pay | Admitting: Family Medicine

## 2020-12-17 DIAGNOSIS — F5101 Primary insomnia: Secondary | ICD-10-CM

## 2020-12-18 DIAGNOSIS — G894 Chronic pain syndrome: Secondary | ICD-10-CM | POA: Diagnosis not present

## 2020-12-18 DIAGNOSIS — I6781 Acute cerebrovascular insufficiency: Secondary | ICD-10-CM | POA: Diagnosis not present

## 2020-12-18 DIAGNOSIS — I69398 Other sequelae of cerebral infarction: Secondary | ICD-10-CM | POA: Diagnosis not present

## 2020-12-18 DIAGNOSIS — Z79899 Other long term (current) drug therapy: Secondary | ICD-10-CM | POA: Diagnosis not present

## 2020-12-18 DIAGNOSIS — F5101 Primary insomnia: Secondary | ICD-10-CM | POA: Diagnosis not present

## 2020-12-18 DIAGNOSIS — M21371 Foot drop, right foot: Secondary | ICD-10-CM | POA: Diagnosis not present

## 2020-12-18 DIAGNOSIS — Z9181 History of falling: Secondary | ICD-10-CM | POA: Diagnosis not present

## 2020-12-18 DIAGNOSIS — M48061 Spinal stenosis, lumbar region without neurogenic claudication: Secondary | ICD-10-CM | POA: Diagnosis not present

## 2020-12-18 NOTE — Telephone Encounter (Signed)
Done at time of his last visit

## 2020-12-19 DIAGNOSIS — I6781 Acute cerebrovascular insufficiency: Secondary | ICD-10-CM | POA: Diagnosis not present

## 2020-12-19 DIAGNOSIS — I69398 Other sequelae of cerebral infarction: Secondary | ICD-10-CM | POA: Diagnosis not present

## 2020-12-19 DIAGNOSIS — M48061 Spinal stenosis, lumbar region without neurogenic claudication: Secondary | ICD-10-CM | POA: Diagnosis not present

## 2020-12-19 DIAGNOSIS — Z79899 Other long term (current) drug therapy: Secondary | ICD-10-CM | POA: Diagnosis not present

## 2020-12-19 DIAGNOSIS — G894 Chronic pain syndrome: Secondary | ICD-10-CM | POA: Diagnosis not present

## 2020-12-19 DIAGNOSIS — F5101 Primary insomnia: Secondary | ICD-10-CM | POA: Diagnosis not present

## 2020-12-19 DIAGNOSIS — M21371 Foot drop, right foot: Secondary | ICD-10-CM | POA: Diagnosis not present

## 2020-12-19 DIAGNOSIS — Z9181 History of falling: Secondary | ICD-10-CM | POA: Diagnosis not present

## 2020-12-20 DIAGNOSIS — G894 Chronic pain syndrome: Secondary | ICD-10-CM | POA: Diagnosis not present

## 2020-12-20 DIAGNOSIS — I69398 Other sequelae of cerebral infarction: Secondary | ICD-10-CM | POA: Diagnosis not present

## 2020-12-20 DIAGNOSIS — M48061 Spinal stenosis, lumbar region without neurogenic claudication: Secondary | ICD-10-CM | POA: Diagnosis not present

## 2020-12-20 DIAGNOSIS — M21371 Foot drop, right foot: Secondary | ICD-10-CM | POA: Diagnosis not present

## 2020-12-20 DIAGNOSIS — F5101 Primary insomnia: Secondary | ICD-10-CM | POA: Diagnosis not present

## 2020-12-20 DIAGNOSIS — Z79899 Other long term (current) drug therapy: Secondary | ICD-10-CM | POA: Diagnosis not present

## 2020-12-20 DIAGNOSIS — Z9181 History of falling: Secondary | ICD-10-CM | POA: Diagnosis not present

## 2020-12-20 DIAGNOSIS — I6781 Acute cerebrovascular insufficiency: Secondary | ICD-10-CM | POA: Diagnosis not present

## 2020-12-22 DIAGNOSIS — Z79899 Other long term (current) drug therapy: Secondary | ICD-10-CM | POA: Diagnosis not present

## 2020-12-22 DIAGNOSIS — M48061 Spinal stenosis, lumbar region without neurogenic claudication: Secondary | ICD-10-CM | POA: Diagnosis not present

## 2020-12-22 DIAGNOSIS — I69398 Other sequelae of cerebral infarction: Secondary | ICD-10-CM | POA: Diagnosis not present

## 2020-12-22 DIAGNOSIS — M21371 Foot drop, right foot: Secondary | ICD-10-CM | POA: Diagnosis not present

## 2020-12-22 DIAGNOSIS — Z9181 History of falling: Secondary | ICD-10-CM | POA: Diagnosis not present

## 2020-12-22 DIAGNOSIS — F5101 Primary insomnia: Secondary | ICD-10-CM | POA: Diagnosis not present

## 2020-12-22 DIAGNOSIS — G894 Chronic pain syndrome: Secondary | ICD-10-CM | POA: Diagnosis not present

## 2020-12-22 DIAGNOSIS — I6781 Acute cerebrovascular insufficiency: Secondary | ICD-10-CM | POA: Diagnosis not present

## 2020-12-25 ENCOUNTER — Encounter: Payer: Self-pay | Admitting: Family Medicine

## 2020-12-26 ENCOUNTER — Telehealth: Payer: Self-pay

## 2020-12-26 MED ORDER — ATORVASTATIN CALCIUM 80 MG PO TABS: 80.0000 mg | ORAL_TABLET | Freq: Every day | ORAL | 3 refills | Status: DC

## 2020-12-26 NOTE — Telephone Encounter (Signed)
-----   Message from Tamsen Roers, PA-C sent at 12/25/2020  5:03 PM EDT ----- Normal blood cell counts and chemistry. Total cholesterol and LDL cholesterol very high and need Atorvastatin 80 mg qd #90 & 3 RF to reduce risk for another stroke. Follow up with new PCP here or consider getting established with someone at [facility].

## 2020-12-27 ENCOUNTER — Encounter: Payer: Self-pay | Admitting: Speech Pathology

## 2020-12-27 DIAGNOSIS — G894 Chronic pain syndrome: Secondary | ICD-10-CM | POA: Diagnosis not present

## 2020-12-27 DIAGNOSIS — M48061 Spinal stenosis, lumbar region without neurogenic claudication: Secondary | ICD-10-CM | POA: Diagnosis not present

## 2020-12-27 DIAGNOSIS — Z79899 Other long term (current) drug therapy: Secondary | ICD-10-CM | POA: Diagnosis not present

## 2020-12-27 DIAGNOSIS — M21371 Foot drop, right foot: Secondary | ICD-10-CM | POA: Diagnosis not present

## 2020-12-27 DIAGNOSIS — I6781 Acute cerebrovascular insufficiency: Secondary | ICD-10-CM | POA: Diagnosis not present

## 2020-12-27 DIAGNOSIS — F5101 Primary insomnia: Secondary | ICD-10-CM | POA: Diagnosis not present

## 2020-12-27 DIAGNOSIS — Z9181 History of falling: Secondary | ICD-10-CM | POA: Diagnosis not present

## 2020-12-27 DIAGNOSIS — I69398 Other sequelae of cerebral infarction: Secondary | ICD-10-CM | POA: Diagnosis not present

## 2020-12-28 ENCOUNTER — Encounter: Payer: Self-pay | Admitting: Occupational Therapy

## 2020-12-28 ENCOUNTER — Ambulatory Visit: Payer: Self-pay

## 2020-12-29 DIAGNOSIS — F5101 Primary insomnia: Secondary | ICD-10-CM | POA: Diagnosis not present

## 2020-12-29 DIAGNOSIS — Z79899 Other long term (current) drug therapy: Secondary | ICD-10-CM | POA: Diagnosis not present

## 2020-12-29 DIAGNOSIS — G894 Chronic pain syndrome: Secondary | ICD-10-CM | POA: Diagnosis not present

## 2020-12-29 DIAGNOSIS — M48061 Spinal stenosis, lumbar region without neurogenic claudication: Secondary | ICD-10-CM | POA: Diagnosis not present

## 2020-12-29 DIAGNOSIS — I69398 Other sequelae of cerebral infarction: Secondary | ICD-10-CM | POA: Diagnosis not present

## 2020-12-29 DIAGNOSIS — I6781 Acute cerebrovascular insufficiency: Secondary | ICD-10-CM | POA: Diagnosis not present

## 2020-12-29 DIAGNOSIS — M21371 Foot drop, right foot: Secondary | ICD-10-CM | POA: Diagnosis not present

## 2020-12-29 DIAGNOSIS — Z9181 History of falling: Secondary | ICD-10-CM | POA: Diagnosis not present

## 2021-01-01 ENCOUNTER — Encounter: Payer: Self-pay | Admitting: Speech Pathology

## 2021-01-01 ENCOUNTER — Encounter: Payer: Self-pay | Admitting: Occupational Therapy

## 2021-01-02 ENCOUNTER — Other Ambulatory Visit: Payer: Self-pay | Admitting: Family Medicine

## 2021-01-02 DIAGNOSIS — I69398 Other sequelae of cerebral infarction: Secondary | ICD-10-CM | POA: Diagnosis not present

## 2021-01-02 DIAGNOSIS — F5101 Primary insomnia: Secondary | ICD-10-CM | POA: Diagnosis not present

## 2021-01-02 DIAGNOSIS — M21371 Foot drop, right foot: Secondary | ICD-10-CM | POA: Diagnosis not present

## 2021-01-02 DIAGNOSIS — Z9181 History of falling: Secondary | ICD-10-CM | POA: Diagnosis not present

## 2021-01-02 DIAGNOSIS — M48061 Spinal stenosis, lumbar region without neurogenic claudication: Secondary | ICD-10-CM | POA: Diagnosis not present

## 2021-01-02 DIAGNOSIS — G894 Chronic pain syndrome: Secondary | ICD-10-CM

## 2021-01-02 DIAGNOSIS — I6781 Acute cerebrovascular insufficiency: Secondary | ICD-10-CM | POA: Diagnosis not present

## 2021-01-02 DIAGNOSIS — Z79899 Other long term (current) drug therapy: Secondary | ICD-10-CM | POA: Diagnosis not present

## 2021-01-03 ENCOUNTER — Encounter: Payer: Self-pay | Admitting: Occupational Therapy

## 2021-01-03 ENCOUNTER — Ambulatory Visit: Payer: Self-pay

## 2021-01-03 DIAGNOSIS — I69398 Other sequelae of cerebral infarction: Secondary | ICD-10-CM | POA: Diagnosis not present

## 2021-01-03 DIAGNOSIS — I6781 Acute cerebrovascular insufficiency: Secondary | ICD-10-CM | POA: Diagnosis not present

## 2021-01-03 DIAGNOSIS — G894 Chronic pain syndrome: Secondary | ICD-10-CM | POA: Diagnosis not present

## 2021-01-03 DIAGNOSIS — M48061 Spinal stenosis, lumbar region without neurogenic claudication: Secondary | ICD-10-CM | POA: Diagnosis not present

## 2021-01-03 DIAGNOSIS — Z9181 History of falling: Secondary | ICD-10-CM | POA: Diagnosis not present

## 2021-01-03 DIAGNOSIS — F5101 Primary insomnia: Secondary | ICD-10-CM | POA: Diagnosis not present

## 2021-01-03 DIAGNOSIS — Z79899 Other long term (current) drug therapy: Secondary | ICD-10-CM | POA: Diagnosis not present

## 2021-01-03 DIAGNOSIS — M21371 Foot drop, right foot: Secondary | ICD-10-CM | POA: Diagnosis not present

## 2021-01-04 ENCOUNTER — Encounter: Payer: Self-pay | Admitting: Speech Pathology

## 2021-01-05 DIAGNOSIS — M21371 Foot drop, right foot: Secondary | ICD-10-CM | POA: Diagnosis not present

## 2021-01-05 DIAGNOSIS — I6781 Acute cerebrovascular insufficiency: Secondary | ICD-10-CM | POA: Diagnosis not present

## 2021-01-05 DIAGNOSIS — F5101 Primary insomnia: Secondary | ICD-10-CM | POA: Diagnosis not present

## 2021-01-05 DIAGNOSIS — I69398 Other sequelae of cerebral infarction: Secondary | ICD-10-CM | POA: Diagnosis not present

## 2021-01-05 DIAGNOSIS — G894 Chronic pain syndrome: Secondary | ICD-10-CM | POA: Diagnosis not present

## 2021-01-05 DIAGNOSIS — M48061 Spinal stenosis, lumbar region without neurogenic claudication: Secondary | ICD-10-CM | POA: Diagnosis not present

## 2021-01-05 DIAGNOSIS — Z9181 History of falling: Secondary | ICD-10-CM | POA: Diagnosis not present

## 2021-01-05 DIAGNOSIS — Z79899 Other long term (current) drug therapy: Secondary | ICD-10-CM | POA: Diagnosis not present

## 2021-01-09 ENCOUNTER — Encounter: Payer: Self-pay | Admitting: Speech Pathology

## 2021-01-09 ENCOUNTER — Encounter: Payer: Self-pay | Admitting: Occupational Therapy

## 2021-01-09 ENCOUNTER — Ambulatory Visit: Payer: Self-pay

## 2021-01-09 ENCOUNTER — Other Ambulatory Visit: Payer: Self-pay | Admitting: Family Medicine

## 2021-01-09 DIAGNOSIS — M21371 Foot drop, right foot: Secondary | ICD-10-CM | POA: Diagnosis not present

## 2021-01-09 DIAGNOSIS — M48061 Spinal stenosis, lumbar region without neurogenic claudication: Secondary | ICD-10-CM | POA: Diagnosis not present

## 2021-01-09 DIAGNOSIS — I69398 Other sequelae of cerebral infarction: Secondary | ICD-10-CM | POA: Diagnosis not present

## 2021-01-09 DIAGNOSIS — F5101 Primary insomnia: Secondary | ICD-10-CM | POA: Diagnosis not present

## 2021-01-09 DIAGNOSIS — G894 Chronic pain syndrome: Secondary | ICD-10-CM | POA: Diagnosis not present

## 2021-01-09 DIAGNOSIS — I6781 Acute cerebrovascular insufficiency: Secondary | ICD-10-CM | POA: Diagnosis not present

## 2021-01-09 DIAGNOSIS — Z9181 History of falling: Secondary | ICD-10-CM | POA: Diagnosis not present

## 2021-01-09 DIAGNOSIS — Z79899 Other long term (current) drug therapy: Secondary | ICD-10-CM | POA: Diagnosis not present

## 2021-01-09 NOTE — Telephone Encounter (Signed)
Requested medication (s) are due for refill today:Due 01/18/21  Requested medication (s) are on the active medication list: yes  Last refill: 12/18/20  #30  0 refills  Future visit scheduled no  Notes to clinic:  off protocol   Please review .  Requested Prescriptions  Pending Prescriptions Disp Refills   BELSOMRA 5 MG TABS [Pharmacy Med Name: BELSOMRA 5MG  TABLETS] 30 tablet     Sig: TAKE 1 TABLET BY MOUTH AT BEDTIME AS NEEDED     Off-Protocol Failed - 01/09/2021  6:44 PM      Failed - Medication not assigned to a protocol, review manually.      Passed - Valid encounter within last 12 months    Recent Outpatient Visits           3 weeks ago Cerebrovascular accident (CVA) due to occlusion of left middle cerebral artery [facility])   [facility] Chr[facility]on, [facility], PA-C   11 months ago Chronic pain syndrome   [facility] Cipro, [employer], PA-C   1 year ago Elevated LDL cholesterol level   [facility]ell Cipro, [employer], PA-C   1 year ago Annual physical exam   Jodell Cipro, [employer], PA-C   2 years ago Chronic 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   Jodell Cipro, [employer], Jodell Cipro

## 2021-01-10 ENCOUNTER — Ambulatory Visit: Payer: Self-pay

## 2021-01-10 NOTE — Telephone Encounter (Signed)
LOV: 12/14/2020 NOV: None  Last refill 12/18/2020 #30 0 Refills  Thanks,   -Vernona Rieger

## 2021-01-11 NOTE — Telephone Encounter (Signed)
Copied from CRM [id number]. Topic: General - Inquiry >> Jan 11, 2021  4:28 PM Daphine Deutscher D wrote: Reason for CRM: Pt called saying he is havig a hard time getting a refill for his generic Zanaflex 4 mg.  He has a rx that was sent in in April for refills but he says the pharmacy will not give then to him.

## 2021-01-12 ENCOUNTER — Encounter: Payer: Self-pay | Admitting: Occupational Therapy

## 2021-01-12 ENCOUNTER — Encounter: Payer: Self-pay | Admitting: Speech Pathology

## 2021-01-12 DIAGNOSIS — Z79899 Other long term (current) drug therapy: Secondary | ICD-10-CM | POA: Diagnosis not present

## 2021-01-12 DIAGNOSIS — G894 Chronic pain syndrome: Secondary | ICD-10-CM | POA: Diagnosis not present

## 2021-01-12 DIAGNOSIS — I69398 Other sequelae of cerebral infarction: Secondary | ICD-10-CM | POA: Diagnosis not present

## 2021-01-12 DIAGNOSIS — M21371 Foot drop, right foot: Secondary | ICD-10-CM | POA: Diagnosis not present

## 2021-01-12 DIAGNOSIS — I6781 Acute cerebrovascular insufficiency: Secondary | ICD-10-CM | POA: Diagnosis not present

## 2021-01-12 DIAGNOSIS — Z9181 History of falling: Secondary | ICD-10-CM | POA: Diagnosis not present

## 2021-01-12 DIAGNOSIS — M48061 Spinal stenosis, lumbar region without neurogenic claudication: Secondary | ICD-10-CM | POA: Diagnosis not present

## 2021-01-12 DIAGNOSIS — F5101 Primary insomnia: Secondary | ICD-10-CM | POA: Diagnosis not present

## 2021-01-12 MED ORDER — TIZANIDINE HCL 4 MG PO TABS: ORAL_TABLET | ORAL | 3 refills | Status: DC

## 2021-01-16 ENCOUNTER — Ambulatory Visit: Payer: Self-pay

## 2021-01-16 ENCOUNTER — Encounter: Payer: Self-pay | Admitting: Speech Pathology

## 2021-01-16 ENCOUNTER — Encounter: Payer: Self-pay | Admitting: Occupational Therapy

## 2021-01-17 DIAGNOSIS — I6781 Acute cerebrovascular insufficiency: Secondary | ICD-10-CM | POA: Diagnosis not present

## 2021-01-17 DIAGNOSIS — G894 Chronic pain syndrome: Secondary | ICD-10-CM | POA: Diagnosis not present

## 2021-01-17 DIAGNOSIS — Z79899 Other long term (current) drug therapy: Secondary | ICD-10-CM | POA: Diagnosis not present

## 2021-01-17 DIAGNOSIS — M48061 Spinal stenosis, lumbar region without neurogenic claudication: Secondary | ICD-10-CM | POA: Diagnosis not present

## 2021-01-17 DIAGNOSIS — I69398 Other sequelae of cerebral infarction: Secondary | ICD-10-CM | POA: Diagnosis not present

## 2021-01-17 DIAGNOSIS — Z9181 History of falling: Secondary | ICD-10-CM | POA: Diagnosis not present

## 2021-01-17 DIAGNOSIS — M21371 Foot drop, right foot: Secondary | ICD-10-CM | POA: Diagnosis not present

## 2021-01-17 DIAGNOSIS — F5101 Primary insomnia: Secondary | ICD-10-CM | POA: Diagnosis not present

## 2021-01-18 ENCOUNTER — Encounter: Payer: Self-pay | Admitting: Occupational Therapy

## 2021-01-18 ENCOUNTER — Encounter: Payer: Self-pay | Admitting: Speech Pathology

## 2021-01-18 DIAGNOSIS — Z9181 History of falling: Secondary | ICD-10-CM | POA: Diagnosis not present

## 2021-01-18 DIAGNOSIS — F5101 Primary insomnia: Secondary | ICD-10-CM | POA: Diagnosis not present

## 2021-01-18 DIAGNOSIS — M48061 Spinal stenosis, lumbar region without neurogenic claudication: Secondary | ICD-10-CM | POA: Diagnosis not present

## 2021-01-18 DIAGNOSIS — M21371 Foot drop, right foot: Secondary | ICD-10-CM | POA: Diagnosis not present

## 2021-01-18 DIAGNOSIS — Z79899 Other long term (current) drug therapy: Secondary | ICD-10-CM | POA: Diagnosis not present

## 2021-01-18 DIAGNOSIS — I6781 Acute cerebrovascular insufficiency: Secondary | ICD-10-CM | POA: Diagnosis not present

## 2021-01-18 DIAGNOSIS — I69398 Other sequelae of cerebral infarction: Secondary | ICD-10-CM | POA: Diagnosis not present

## 2021-01-18 DIAGNOSIS — G894 Chronic pain syndrome: Secondary | ICD-10-CM | POA: Diagnosis not present

## 2021-01-19 DIAGNOSIS — I69398 Other sequelae of cerebral infarction: Secondary | ICD-10-CM | POA: Diagnosis not present

## 2021-01-19 DIAGNOSIS — F5101 Primary insomnia: Secondary | ICD-10-CM | POA: Diagnosis not present

## 2021-01-19 DIAGNOSIS — G894 Chronic pain syndrome: Secondary | ICD-10-CM | POA: Diagnosis not present

## 2021-01-19 DIAGNOSIS — Z79899 Other long term (current) drug therapy: Secondary | ICD-10-CM | POA: Diagnosis not present

## 2021-01-19 DIAGNOSIS — Z9181 History of falling: Secondary | ICD-10-CM | POA: Diagnosis not present

## 2021-01-19 DIAGNOSIS — M48061 Spinal stenosis, lumbar region without neurogenic claudication: Secondary | ICD-10-CM | POA: Diagnosis not present

## 2021-01-19 DIAGNOSIS — I6781 Acute cerebrovascular insufficiency: Secondary | ICD-10-CM | POA: Diagnosis not present

## 2021-01-19 DIAGNOSIS — M21371 Foot drop, right foot: Secondary | ICD-10-CM | POA: Diagnosis not present

## 2021-01-23 ENCOUNTER — Encounter: Payer: Self-pay | Admitting: Family Medicine

## 2021-01-23 ENCOUNTER — Ambulatory Visit (INDEPENDENT_AMBULATORY_CARE_PROVIDER_SITE_OTHER): Payer: Medicare HMO | Admitting: Family Medicine

## 2021-01-23 ENCOUNTER — Other Ambulatory Visit: Payer: Self-pay | Admitting: Family Medicine

## 2021-01-23 ENCOUNTER — Encounter: Payer: Self-pay | Admitting: Speech Pathology

## 2021-01-23 ENCOUNTER — Encounter: Payer: Self-pay | Admitting: Occupational Therapy

## 2021-01-23 ENCOUNTER — Other Ambulatory Visit: Payer: Self-pay

## 2021-01-23 ENCOUNTER — Ambulatory Visit: Payer: Self-pay

## 2021-01-23 VITALS — BP 117/77 | HR 73 | Temp 98.3°F | Resp 16 | Wt 218.0 lb

## 2021-01-23 DIAGNOSIS — M792 Neuralgia and neuritis, unspecified: Secondary | ICD-10-CM

## 2021-01-23 DIAGNOSIS — I6930 Unspecified sequelae of cerebral infarction: Secondary | ICD-10-CM

## 2021-01-23 DIAGNOSIS — N521 Erectile dysfunction due to diseases classified elsewhere: Secondary | ICD-10-CM

## 2021-01-23 DIAGNOSIS — I10 Essential (primary) hypertension: Secondary | ICD-10-CM | POA: Diagnosis not present

## 2021-01-23 DIAGNOSIS — G894 Chronic pain syndrome: Secondary | ICD-10-CM

## 2021-01-23 DIAGNOSIS — K1379 Other lesions of oral mucosa: Secondary | ICD-10-CM

## 2021-01-23 DIAGNOSIS — N5089 Other specified disorders of the male genital organs: Secondary | ICD-10-CM

## 2021-01-23 DIAGNOSIS — F5101 Primary insomnia: Secondary | ICD-10-CM

## 2021-01-23 DIAGNOSIS — Z23 Encounter for immunization: Secondary | ICD-10-CM

## 2021-01-23 DIAGNOSIS — F09 Unspecified mental disorder due to known physiological condition: Secondary | ICD-10-CM

## 2021-01-23 DIAGNOSIS — E7800 Pure hypercholesterolemia, unspecified: Secondary | ICD-10-CM

## 2021-01-23 MED ORDER — DONEPEZIL HCL 10 MG PO TABS: 10.0000 mg | ORAL_TABLET | Freq: Every day | ORAL | 3 refills | Status: DC

## 2021-01-23 MED ORDER — AMLODIPINE BESYLATE 5 MG PO TABS: 5.0000 mg | ORAL_TABLET | Freq: Every day | ORAL | 3 refills | Status: DC

## 2021-01-23 MED ORDER — GABAPENTIN 300 MG PO CAPS: 300.0000 mg | ORAL_CAPSULE | Freq: Three times a day (TID) | ORAL | 3 refills | Status: DC

## 2021-01-23 MED ORDER — LIDOCAINE VISCOUS HCL 2 % MT SOLN: 5.0000 mL | Freq: Four times a day (QID) | OROMUCOSAL | 0 refills | Status: DC | PRN

## 2021-01-23 MED ORDER — ATORVASTATIN CALCIUM 80 MG PO TABS: 80.0000 mg | ORAL_TABLET | Freq: Every day | ORAL | 3 refills | Status: DC

## 2021-01-23 MED ORDER — SILDENAFIL CITRATE 100 MG PO TABS: 100.0000 mg | ORAL_TABLET | ORAL | 11 refills | Status: DC | PRN

## 2021-01-23 MED ORDER — TIZANIDINE HCL 4 MG PO TABS: 4.0000 mg | ORAL_TABLET | Freq: Four times a day (QID) | ORAL | 3 refills | Status: DC

## 2021-01-23 MED ORDER — MELOXICAM 15 MG PO TABS: 15.0000 mg | ORAL_TABLET | Freq: Every day | ORAL | 3 refills | Status: DC

## 2021-01-23 NOTE — Addendum Note (Signed)
Addended by: Fonda Kinder on: 01/23/2021 02:28 PM   Modules accepted: Orders

## 2021-01-23 NOTE — Assessment & Plan Note (Signed)
Wishes to restart opioids  Advised to increase mobic, add gaba, increase xanaflex

## 2021-01-23 NOTE — Assessment & Plan Note (Signed)
Chronic, stable, pulled for medication refill

## 2021-01-23 NOTE — Assessment & Plan Note (Signed)
Slight R side impairment; primarily in hand grip and strength; can move arm and leg  Some speech ataxia, clearly frustrated by difficulty getting point across

## 2021-01-23 NOTE — Progress Notes (Signed)
Established patient visit   Patient: David Reeves   DOB: [birth year]   50 y.o. Male  MRN: [id number] Visit Date: 01/23/2021  Today's healthcare provider: Jacky Kindle, FNP   Chief Complaint  Patient presents with   Back Pain   Subjective    HPI HPI     Back Pain   This is a chronic problem.  There was not an injury that may have caused the pain.  Recent episode started more than a year ago.  The problem has been waxing and waning since onset.  Pain is lumbar spine.  The quality of pain is described as stabbing.  Severity of the pain is severe.  Symptoms worse in evening.  The symptoms are aggravated by position.  Treatments: NSAIDs and physical therapy (Muscle relaxant, ).  Treatment provided no relief.  Abdominal Pain: Absent.  Bowel incontinence: Absent.  Chest pain: Absent.  Dysuria: Absent.  Fever: Absent.  Headaches: Absent.  Joint pains: Present.  Weakness in leg: Present.  Pelvic pain: Absent.  Tingling in lower extremities: Absent.  Urinary incontinence: Absent.  Weight loss: Absent.      Last edited by Fonda Kinder, CMA on 01/23/2021 10:15 AM.       Medications: Outpatient Medications Prior to Visit  Medication Sig   Cholecalciferol (VITAMIN D3) 2000 units TABS Take by mouth.   Multiple Vitamin (MULTIVITAMIN) tablet Take 1 tablet by mouth daily.   UNABLE TO FIND cbd   [DISCONTINUED] amLODipine (NORVASC) 5 MG tablet Take 1 tablet (5 mg total) by mouth daily.   [DISCONTINUED] atorvastatin (LIPITOR) 80 MG tablet Take 1 tablet (80 mg total) by mouth daily.   [DISCONTINUED] donepezil (ARICEPT) 10 MG tablet    [DISCONTINUED] meloxicam (MOBIC) 15 MG tablet TAKE 1/2 TABLET BY MOUTH EVERY DAY   [DISCONTINUED] Suvorexant (BELSOMRA) 5 MG TABS TAKE 1 TABLET BY MOUTH AT BEDTIME AS NEEDED   [DISCONTINUED] tiZANidine (ZANAFLEX) 4 MG tablet TAKE 1 TABLET(4 MG) BY MOUTH THREE TIMES DAILY   No facility-administered medications prior to visit.    Review of  Systems     Objective    BP 117/77   Pulse 73   Temp 98.3 F (36.8 C) (Oral)   Resp 16   Wt 218 lb (98.9 kg)   BMI 28.76 kg/m  {Show previous vital signs (optional):23777}  Physical Exam Vitals and nursing note reviewed.  Constitutional:      Appearance: Normal appearance. He is overweight.  HENT:     Head: Normocephalic and atraumatic.     Comments: C/o allergic symptoms Advised OTC  -zyrtec -claritin  Exam WDL Eyes:     Pupils: Pupils are equal, round, and reactive to light.  Cardiovascular:     Rate and Rhythm: Normal rate and regular rhythm.     Pulses: Normal pulses.     Heart sounds: Normal heart sounds.  Pulmonary:     Effort: Pulmonary effort is normal.     Breath sounds: Normal breath sounds.  Abdominal:     General: Bowel sounds are normal.     Palpations: Abdomen is soft.     Tenderness: There is no abdominal tenderness.     Hernia: A hernia is present. Hernia is present in the left inguinal area.  Genitourinary:    Penis: Circumcised. Swelling present. No erythema, tenderness or discharge.      Testes:        Left: Swelling present.     Tanner stage (genital):  5.     Comments: Apparent L hernia Additional US scrotal ordered Musculoskeletal:     Cervical back: Normal range of motion.     Comments: Limited ROM in RUE and limited hand strength/dexterity s/p CVA  Skin:    General: Skin is warm and dry.     Capillary Refill: Capillary refill takes less than 2 seconds.  Neurological:     General: No focal deficit present.     Mental Status: He is alert and oriented to person, place, and time. Mental status is at baseline.  Psychiatric:        Mood and Affect: Mood normal. Affect is labile.        Speech: Speech is slurred.        Behavior: Behavior normal.        Thought Content: Thought content normal.        Cognition and Memory: Cognition and memory normal.        Judgment: Judgment normal.     Comments: Pt expressed frustration over speech  ataxia      No results found for any visits on 01/23/21.  Assessment & Plan     Problem List Items Addressed This Visit       Cardiovascular and Mediastinum   Primary hypertension    Chronic Stable Pulled for medication refill       Relevant Medications   amLODipine (NORVASC) 5 MG tablet   atorvastatin (LIPITOR) 80 MG tablet   sildenafil (VIAGRA) 100 MG tablet     Nervous and Auditory   Cognitive dysfunction    Chronic Stable Pulled for medication refill      Relevant Medications   donepezil (ARICEPT) 10 MG tablet   Other Relevant Orders   AMB Referral to [facility] Co[facility]     Genitourinary   Scrotal edema    Chronic, worsening [place] pending Denies pain Appears to be L inguinal hernia Hx of both L and R inguinal hernia prior with mesh insert       Relevant Orders   US Scrotum     Other   Chronic pain syndrome    Wishes to restart opioids  Advised to increase mobic, add gaba, increase xanaflex      Relevant Medications   meloxicam (MOBIC) 15 MG tablet   tiZANidine (ZANAFLEX) 4 MG tablet   gabapentin (NEURONTIN) 300 MG capsule   Other Relevant Orders   Ambulatory referral to Orthopedics   Nerve pain - Primary    Trial of gabapentin to assist with DDD and chronic pain      Relevant Medications   gabapentin (NEURONTIN) 300 MG capsule   Erectile dysfunction due to diseases classified elsewhere    Request for medication Reviewed 911 concerns- patient voiced understanding      Relevant Medications   sildenafil (VIAGRA) 100 MG tablet   Elevated LDL cholesterol level    Chronic, stable, pulled for medication refill      Relevant Medications   atorvastatin (LIPITOR) 80 MG tablet   History of stroke with residual deficit    Slight R side impairment; primarily in hand grip and strength; can move arm and leg  Some speech ataxia, clearly frustrated by difficulty getting point across      Relevant Orders   AMB Referral to [facility]  Coordinaton   Primary insomnia    Has tried multiple agents- difficult falling asleep and staying asleep Recommend referral to neurology; pt agreeable      Relevant Orders  Ambulatory referral to Neurology   Mouth pain    request for refill of magic mouthwash Has been biting r side of mouth when eating d/t lack of feeling       Relevant Medications   magic mouthwash (lidocaine, diphenhydrAMINE, alum & mag hydroxide) suspension     Return in about 4 weeks (around 02/20/2021) for chonic disease management.     Leilani Merl, FNP, have reviewed all documentation for this visit. The documentation on 01/23/21 for the exam, diagnosis, procedures, and orders are all accurate and complete.    Jacky Kindle, FNP  [facility] [phone] (phone) [phone] (fax)  [facility]

## 2021-01-23 NOTE — Assessment & Plan Note (Signed)
Trial of gabapentin to assist with DDD and chronic pain

## 2021-01-23 NOTE — Assessment & Plan Note (Signed)
Chronic, worsening [place] pending Denies pain Appears to be L inguinal hernia Hx of both L and R inguinal hernia prior with mesh insert

## 2021-01-23 NOTE — Assessment & Plan Note (Signed)
Chronic Stable Pulled for medication refill 

## 2021-01-23 NOTE — Assessment & Plan Note (Signed)
Chronic Stable Pulled for medication refill

## 2021-01-23 NOTE — Assessment & Plan Note (Signed)
Has tried multiple agents- difficult falling asleep and staying asleep Recommend referral to neurology; pt agreeable

## 2021-01-23 NOTE — Assessment & Plan Note (Signed)
Request for medication Reviewed 911 concerns- patient voiced understanding

## 2021-01-23 NOTE — Assessment & Plan Note (Signed)
request for refill of magic mouthwash Has been biting r side of mouth when eating d/t lack of feeling

## 2021-01-24 DIAGNOSIS — I6781 Acute cerebrovascular insufficiency: Secondary | ICD-10-CM | POA: Diagnosis not present

## 2021-01-24 DIAGNOSIS — G894 Chronic pain syndrome: Secondary | ICD-10-CM | POA: Diagnosis not present

## 2021-01-24 DIAGNOSIS — Z79899 Other long term (current) drug therapy: Secondary | ICD-10-CM | POA: Diagnosis not present

## 2021-01-24 DIAGNOSIS — Z9181 History of falling: Secondary | ICD-10-CM | POA: Diagnosis not present

## 2021-01-24 DIAGNOSIS — F5101 Primary insomnia: Secondary | ICD-10-CM | POA: Diagnosis not present

## 2021-01-24 DIAGNOSIS — M48061 Spinal stenosis, lumbar region without neurogenic claudication: Secondary | ICD-10-CM | POA: Diagnosis not present

## 2021-01-24 DIAGNOSIS — M21371 Foot drop, right foot: Secondary | ICD-10-CM | POA: Diagnosis not present

## 2021-01-24 DIAGNOSIS — I69398 Other sequelae of cerebral infarction: Secondary | ICD-10-CM | POA: Diagnosis not present

## 2021-01-25 ENCOUNTER — Encounter: Payer: Self-pay | Admitting: Speech Pathology

## 2021-01-25 ENCOUNTER — Ambulatory Visit: Payer: Self-pay

## 2021-01-25 ENCOUNTER — Ambulatory Visit
Admission: RE | Admit: 2021-01-25 | Discharge: 2021-01-25 | Disposition: A | Discharge status: Home / Self Care | Payer: Medicare HMO | Source: Ambulatory Visit | Attending: Family Medicine | Admitting: Family Medicine

## 2021-01-25 ENCOUNTER — Telehealth: Payer: Self-pay | Admitting: *Deleted

## 2021-01-25 ENCOUNTER — Encounter: Payer: Self-pay | Admitting: Occupational Therapy

## 2021-01-25 ENCOUNTER — Other Ambulatory Visit: Payer: Self-pay

## 2021-01-25 DIAGNOSIS — N5089 Other specified disorders of the male genital organs: Secondary | ICD-10-CM | POA: Insufficient documentation

## 2021-01-25 DIAGNOSIS — N503 Cyst of epididymis: Secondary | ICD-10-CM | POA: Diagnosis not present

## 2021-01-25 DIAGNOSIS — N433 Hydrocele, unspecified: Secondary | ICD-10-CM | POA: Diagnosis not present

## 2021-01-25 NOTE — Chronic Care Management (AMB) (Signed)
  Chronic Care Management   Outreach Note  01/25/2021 Name: David Reeves MRN: [id number] DOB: 12/18/1970  David Reeves is a 50 y.o. year old male who is a primary care patient of Jacky Kindle, FNP. I reached out to [name] by phone today in response to a referral sent by Mr. Suann Larry Vogan's primary care provider.  An unsuccessful telephone outreach was attempted today. The patient was referred to the case management team for assistance with care management and care coordination.   Follow Up Plan: A HIPAA compliant phone message was left for the patient providing contact information and requesting a return call.  If patient returns call to provider office, please advise to call Embedded Care Management Care Guide Hally Colella at [phone]  Burman Nieves, CCMA Care Guide, Embedded Care Coordination [facility]  Care Management  Direct Dial: [phone]

## 2021-01-26 DIAGNOSIS — G894 Chronic pain syndrome: Secondary | ICD-10-CM | POA: Diagnosis not present

## 2021-01-26 DIAGNOSIS — F5101 Primary insomnia: Secondary | ICD-10-CM | POA: Diagnosis not present

## 2021-01-26 DIAGNOSIS — I69398 Other sequelae of cerebral infarction: Secondary | ICD-10-CM | POA: Diagnosis not present

## 2021-01-26 DIAGNOSIS — Z79899 Other long term (current) drug therapy: Secondary | ICD-10-CM | POA: Diagnosis not present

## 2021-01-26 DIAGNOSIS — Z9181 History of falling: Secondary | ICD-10-CM | POA: Diagnosis not present

## 2021-01-26 DIAGNOSIS — I6781 Acute cerebrovascular insufficiency: Secondary | ICD-10-CM | POA: Diagnosis not present

## 2021-01-26 DIAGNOSIS — M21371 Foot drop, right foot: Secondary | ICD-10-CM | POA: Diagnosis not present

## 2021-01-26 DIAGNOSIS — M48061 Spinal stenosis, lumbar region without neurogenic claudication: Secondary | ICD-10-CM | POA: Diagnosis not present

## 2021-01-28 ENCOUNTER — Ambulatory Visit
Admission: EM | Admit: 2021-01-28 | Discharge: 2021-01-28 | Disposition: A | Discharge status: Home / Self Care | Payer: Medicare HMO | Attending: Emergency Medicine | Admitting: Emergency Medicine

## 2021-01-28 ENCOUNTER — Other Ambulatory Visit: Payer: Self-pay

## 2021-01-28 DIAGNOSIS — L03012 Cellulitis of left finger: Secondary | ICD-10-CM

## 2021-01-28 MED ORDER — DOXYCYCLINE HYCLATE 100 MG PO CAPS
100.0000 mg | ORAL_CAPSULE | Freq: Two times a day (BID) | ORAL | 0 refills | Status: DC
Start: 2021-01-28 — End: 2021-02-14

## 2021-01-28 NOTE — Discharge Instructions (Addendum)
Take the Doxycycline twice daily with food for 10 days.  Soak your finger in warm water and Epsom salts twice daily.  Keep the area clean and dry.  Return for re-evaluation for new or worsening symptoms.

## 2021-01-28 NOTE — ED Provider Notes (Signed)
[facility]    CSN: [id number] Arrival date & time: 01/28/21  1034      History   Chief Complaint Chief Complaint  Patient presents with   finger infection    HPI David Reeves is a 50 y.o. male.   HPI  50 year old male here for evaluation of redness and swelling to his left middle finger.  Patient is here with family reports that the patient went for Medicare the day before symptom onset.  The following day he had some redness surrounding the cuticle of his left middle finger and swelling.  The patient used an X-Acto knife to try and drain the area himself but nothing was drained.  Since then he has had increased redness, swelling, and tenderness.  There is pus visible along the edge of the nail.  Patient denies any history of nail biting.  Past Medical History:  Diagnosis Date   Back pain 2014   Bone spur    Bulging disc 2014   Degenerative disc disease    Osteoarthritis    Panic anxiety syndrome    Taking multiple medications for chronic disease     Patient Active Problem List   Diagnosis Date Noted   Chronic pain syndrome 01/23/2021   Primary hypertension 01/23/2021   Nerve pain 01/23/2021   Erectile dysfunction due to diseases classified elsewhere 01/23/2021   Cognitive dysfunction 01/23/2021   Scrotal edema 01/23/2021   Elevated LDL cholesterol level 01/23/2021   History of stroke with residual deficit 01/23/2021   Primary insomnia 01/23/2021   Mouth pain 01/23/2021   Laceration of right hand without foreign body    Closed compression fracture of L1 lumbar vertebra 05/24/2016   Lumbar stenosis with neurogenic claudication 11/01/2015   Chronic 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 11/08/2014   Hx of hemorrhoids 11/08/2014   AAA (abdominal aortic aneurysm) (HCC) 09/22/2014   Chronic pain associated with significant psychosocial dysfunction 09/22/2014   Panic attack 09/22/2014   AB (asthmatic bronchitis) 08/17/2014   Anxiety  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 08/17/2014   Backache 08/17/2014   Lumbosacral spondylosis without myelopathy 08/17/2014   Disorder of male genital organs 08/17/2014   Brash 08/17/2014   Low back pain 08/17/2014   Tendon nodule 08/17/2014   Episodic paroxysmal anxiety disorder 08/17/2014   Hernia, inguinal, right 08/17/2014   Fast heart beat 08/17/2014   Illness 08/17/2014   Inguinal hernia 10/14/2012    Past Surgical History:  Procedure Laterality Date   CYSTECTOMY     head   HERNIA REPAIR Left 1191,[id number]   [facility]   TOE SURGERY Right 2007       Home Medications    Prior to Admission medications   Medication Sig Start Date End Date Taking? Authorizing Provider  amLODipine (NORVASC) 5 MG tablet Take 1 tablet (5 mg total) by mouth daily. 01/23/21  Yes Jacky Kindle, FNP  atorvastatin (LIPITOR) 80 MG tablet Take 1 tablet (80 mg total) by mouth daily. 01/23/21  Yes Merita Norton T, FNP  Cholecalciferol (VITAMIN D3) 2000 units TABS Take by mouth.   Yes [provider]  donepezil (ARICEPT) 10 MG tablet Take 1 tablet (10 mg total) by mouth at bedtime. 01/23/21  Yes Jacky Kindle, FNP  doxycycline (VIBRAMYCIN) 100 MG capsule Take 1 capsule (100 mg total) by mouth 2 (two) times daily. 01/28/21  Yes Becky Augusta, NP  gabapentin (NEURONTIN) 300 MG capsule Take 1 capsule (300 mg total) by mouth 3 (three) times  daily. 01/23/21  Yes Jacky Kindle, FNP  magic mouthwash (lidocaine, diphenhydrAMINE, alum & mag hydroxide) suspension Swish and spit 5 mLs 4 (four) times daily as needed for mouth pain. 01/23/21  Yes Jacky Kindle, FNP  meloxicam (MOBIC) 15 MG tablet Take 1 tablet (15 mg total) by mouth daily. 01/23/21  Yes Merita Norton T, FNP  Multiple Vitamin (MULTIVITAMIN) tablet Take 1 tablet by mouth daily.   Yes [provider]  sildenafil (VIAGRA) 100 MG tablet Take 1 tablet (100 mg total) by mouth as needed for erectile dysfunction. 01/23/21  Yes Jacky Kindle, FNP   tiZANidine (ZANAFLEX) 4 MG tablet Take 1 tablet (4 mg total) by mouth every 6 (six) hours. TAKE 1 TABLET(4 MG) BY MOUTH THREE TIMES DAILY 01/23/21  Yes Jacky Kindle, FNP  UNABLE TO FIND cbd   Yes [provider]    Family History Family History  Problem Relation Age of Onset   Diabetes Mother    Diabetes Maternal Uncle    Diabetes Maternal Grandmother    Heart disease Maternal Grandmother     Social History Social History   Tobacco Use   Smoking status: Never   Smokeless tobacco: Never   Tobacco comments:    cbd    Vaping Use   Vaping Use: Never used  Substance Use Topics   Alcohol use: Not Currently    Comment: last used 12-22-17   Drug use: Not Currently    Types: Marijuana    Comment: October 14, 2017     Allergies   Duloxetine, Amitriptyline, Buspirone, Ciprofloxacin, Doxepin, Escitalopram, Olanzapine, Penicillins, Sertraline, and Trazodone   Review of Systems Review of Systems  Musculoskeletal:  Positive for myalgias.  Skin:  Positive for color change and wound.  Hematological: Negative.   Psychiatric/Behavioral: Negative.      Physical Exam Triage Vital Signs ED Triage Vitals  Enc Vitals Group     BP 01/28/21 1129 (!) 136/91     Pulse Rate 01/28/21 1129 78     Resp 01/28/21 1129 18     Temp 01/28/21 1129 98.9 F (37.2 C)     Temp Source 01/28/21 1129 Oral     SpO2 01/28/21 1129 100 %     Weight 01/28/21 1128 225 lb (102.1 kg)     Height 01/28/21 1128 6\' 1"  (1.854 m)     Head Circumference --      Peak Flow --      Pain Score 01/28/21 1127 10     Pain Loc --      Pain Edu? --      Excl. in GC? --    No data found.  Updated Vital Signs BP (!) 136/91 (BP Location: Left Arm)   Pulse 78   Temp 98.9 F (37.2 C) (Oral)   Resp 18   Ht 6\' 1"  (1.854 m)   Wt 225 lb (102.1 kg)   SpO2 100%   BMI 29.69 kg/m   Visual Acuity Right Eye Distance:   Left Eye Distance:   Bilateral Distance:    Right Eye Near:   Left Eye Near:    Bilateral  Near:     Physical Exam Vitals and nursing note reviewed.  Constitutional:      General: He is not in acute distress.    Appearance: Normal appearance. He is not ill-appearing.  HENT:     Head: Normocephalic and atraumatic.  Musculoskeletal:        General: No swelling, tenderness  or deformity. Normal range of motion.  Skin:    Capillary Refill: Capillary refill takes less than 2 seconds.     Findings: Erythema and lesion present.  Neurological:     General: No focal deficit present.     Mental Status: He is alert and oriented to person, place, and time.  Psychiatric:        Mood and Affect: Mood normal.        Behavior: Behavior normal.        Thought Content: Thought content normal.        Judgment: Judgment normal.     UC Treatments / Results  Labs (all labs ordered are listed, but only abnormal results are displayed) Labs Reviewed - No data to display  EKG   Radiology No results found.  Procedures Incision and Drainage  Date/Time: 01/28/2021 12:27 PM Performed by: Becky Augusta, NP Authorized by: Becky Augusta, NP   Consent:    Consent obtained:  Verbal   Consent given by:  Patient   Risks discussed:  Bleeding, incomplete drainage, pain and infection   Alternatives discussed:  Referral Universal protocol:    Patient identity confirmed:  Verbally with patient Location:    Type:  Abscess (Paronychia)   Location:  Upper extremity   Upper extremity location:  Finger   Finger location:  L long finger Pre-procedure details:    Skin preparation:  Antiseptic wash Sedation:    Sedation type:  None Anesthesia:    Anesthesia method:  None Procedure type:    Complexity:  Simple Procedure details:    Incision types:  Stab incision   Incision depth:  Dermal   Drainage:  Purulent   Drainage amount:  Moderate   Wound treatment:  Wound left open   Packing materials:  None Post-procedure details:    Procedure completion:  Tolerated well, no immediate  complications (including critical care time)  Medications Ordered in UC Medications - No data to display  Initial Impression / Assessment and Plan / UC Course  I have reviewed the triage vital signs and the nursing notes.  Pertinent labs & imaging results that were available during my care of the patient were reviewed by me and considered in my medical decision making (see chart for details).  Patient very pleasant 50 year old male here for evaluation of left middle fingertip infection that has been present for the last 4 days.  The patient did attempt to drain on his own unsuccessfully states that it became worse following the attempt with an X-Acto knife at home.  Patient physical exam reveals erythema to the lateral cuticle of the left middle finger with surrounding erythema of the distal phalanx.  There is no induration or fluctuance at the pad of the finger but there is a visible tan pus collection along the nail edge.  Patient is no tenderness of the DIP joint and has full range of motion and sensation of the finger.  We will drain the pus collection and discharge patient home on antibiotic therapy.  I have also encouraged the patient to soak his finger twice daily in warm water and Epsom salt.  He is allergic to penicillin and Cipro so will discharge home on doxycycline.   Final Clinical Impressions(s) / UC Diagnoses   Final diagnoses:  Paronychia of finger of left hand     Discharge Instructions      Take the Doxycycline twice daily with food for 10 days.  Soak your finger in warm water and Epsom  salts twice daily.  Keep the area clean and dry.  Return for re-evaluation for new or worsening symptoms.      ED Prescriptions     Medication Sig Dispense Auth. Provider   doxycycline (VIBRAMYCIN) 100 MG capsule Take 1 capsule (100 mg total) by mouth 2 (two) times daily. 20 capsule Becky Augusta, NP      PDMP not reviewed this encounter.   Becky Augusta, NP 01/28/21  1230

## 2021-01-28 NOTE — ED Triage Notes (Signed)
Pt c/o possible infection to his left middle finger since this past Wednesday. There is redness and swelling around the nail bed. Pt did try to drain the site himself, this did not help.

## 2021-01-29 ENCOUNTER — Telehealth: Payer: Self-pay | Admitting: Family Medicine

## 2021-01-29 ENCOUNTER — Ambulatory Visit: Payer: Self-pay

## 2021-01-29 ENCOUNTER — Encounter: Payer: Self-pay | Admitting: Speech Pathology

## 2021-01-29 ENCOUNTER — Encounter: Payer: Self-pay | Admitting: Occupational Therapy

## 2021-01-29 NOTE — Telephone Encounter (Signed)
Pt states Robynn Pane was supposed to prescribe  Lunesta.  But it is not at the pharmacy.  Wife was told they did not get the magic mouthwash either. But I do see it was sent.  [id number] - [place], [place] - [address]  In addition, pt had imaging done.  He would like to proceed with the referral for surgery to have the cyst removed.

## 2021-01-30 ENCOUNTER — Other Ambulatory Visit: Payer: Self-pay | Admitting: Family Medicine

## 2021-01-30 ENCOUNTER — Ambulatory Visit: Payer: Self-pay

## 2021-01-30 DIAGNOSIS — F5101 Primary insomnia: Secondary | ICD-10-CM

## 2021-01-30 DIAGNOSIS — N4340 Spermatocele of epididymis, unspecified: Secondary | ICD-10-CM

## 2021-01-30 MED ORDER — ESZOPICLONE 1 MG PO TABS: ORAL_TABLET | ORAL | 0 refills | Status: DC

## 2021-01-30 NOTE — Telephone Encounter (Signed)
Please review mouth was was sent into [facility] on 10/4 in reference to [facility] in your note you mentioned referring patient I didn't see it mentioned medication. KW

## 2021-01-31 ENCOUNTER — Telehealth: Payer: Self-pay | Admitting: Family Medicine

## 2021-01-31 ENCOUNTER — Telehealth: Payer: Self-pay

## 2021-01-31 DIAGNOSIS — Z79899 Other long term (current) drug therapy: Secondary | ICD-10-CM | POA: Diagnosis not present

## 2021-01-31 DIAGNOSIS — F5101 Primary insomnia: Secondary | ICD-10-CM | POA: Diagnosis not present

## 2021-01-31 DIAGNOSIS — M21371 Foot drop, right foot: Secondary | ICD-10-CM | POA: Diagnosis not present

## 2021-01-31 DIAGNOSIS — I6781 Acute cerebrovascular insufficiency: Secondary | ICD-10-CM | POA: Diagnosis not present

## 2021-01-31 DIAGNOSIS — I69398 Other sequelae of cerebral infarction: Secondary | ICD-10-CM | POA: Diagnosis not present

## 2021-01-31 DIAGNOSIS — M48061 Spinal stenosis, lumbar region without neurogenic claudication: Secondary | ICD-10-CM | POA: Diagnosis not present

## 2021-01-31 DIAGNOSIS — Z9181 History of falling: Secondary | ICD-10-CM | POA: Diagnosis not present

## 2021-01-31 DIAGNOSIS — G894 Chronic pain syndrome: Secondary | ICD-10-CM | POA: Diagnosis not present

## 2021-01-31 NOTE — Telephone Encounter (Signed)
Copied from CRM [id number]. Topic: General - Other >> Jan 31, 2021  2:57 PM Gaetana Michaelis A wrote: Reason for CRM: Herbert Seta with [employer] has called regarding patient's referral #[id number]  Herbert Seta was unable to locate insurance on record for the patient and has additional questions regarding the nature of the visit and the referral   Please contact further when possible

## 2021-01-31 NOTE — Telephone Encounter (Signed)
Copied from CRM [id number]. Topic: General - Other >> Jan 31, 2021  9:45 AM Fanny Bien wrote: Reason for CRM: Pt called and stated that he would like someone from the office to help him apply for medicaid. Please advise

## 2021-01-31 NOTE — Telephone Encounter (Signed)
Pt states the pain clinic referral he was referred to turned him down.  Pt needs new referral to another pain clinic.

## 2021-02-01 ENCOUNTER — Other Ambulatory Visit: Payer: Self-pay | Admitting: Family Medicine

## 2021-02-01 ENCOUNTER — Telehealth: Payer: Self-pay | Admitting: Family Medicine

## 2021-02-01 ENCOUNTER — Encounter: Payer: Self-pay | Admitting: Speech Pathology

## 2021-02-01 ENCOUNTER — Ambulatory Visit: Payer: Self-pay

## 2021-02-01 ENCOUNTER — Encounter: Payer: Self-pay | Admitting: Occupational Therapy

## 2021-02-01 DIAGNOSIS — F5101 Primary insomnia: Secondary | ICD-10-CM

## 2021-02-01 DIAGNOSIS — M792 Neuralgia and neuritis, unspecified: Secondary | ICD-10-CM

## 2021-02-01 DIAGNOSIS — Z79899 Other long term (current) drug therapy: Secondary | ICD-10-CM | POA: Diagnosis not present

## 2021-02-01 DIAGNOSIS — Z9181 History of falling: Secondary | ICD-10-CM | POA: Diagnosis not present

## 2021-02-01 DIAGNOSIS — M48061 Spinal stenosis, lumbar region without neurogenic claudication: Secondary | ICD-10-CM | POA: Diagnosis not present

## 2021-02-01 DIAGNOSIS — G894 Chronic pain syndrome: Secondary | ICD-10-CM

## 2021-02-01 DIAGNOSIS — F09 Unspecified mental disorder due to known physiological condition: Secondary | ICD-10-CM

## 2021-02-01 DIAGNOSIS — I6781 Acute cerebrovascular insufficiency: Secondary | ICD-10-CM | POA: Diagnosis not present

## 2021-02-01 DIAGNOSIS — M21371 Foot drop, right foot: Secondary | ICD-10-CM | POA: Diagnosis not present

## 2021-02-01 DIAGNOSIS — I69398 Other sequelae of cerebral infarction: Secondary | ICD-10-CM | POA: Diagnosis not present

## 2021-02-01 NOTE — Telephone Encounter (Signed)
Home Health Verbal Orders - Caller/Agency: Judeth Cornfield with [facility]  Callback Number: [phone]  Requesting OT/PT/Skilled Nursing/Social Work/Speech Therapy:   OT  1 time a week x 5 weeks

## 2021-02-01 NOTE — Telephone Encounter (Signed)
Please review. KW 

## 2021-02-01 NOTE — Chronic Care Management (AMB) (Signed)
  Chronic Care Management   Note  02/01/2021 Name: David Reeves MRN: [id number] DOB: 05-04-1970  David Reeves is a 50 y.o. year old male who is a primary care patient of Gwyneth Sprout, FNP. I reached out to Doristine Church by phone today in response to a referral sent by [name] PCP.  [name] was given information about Chronic Care Management services today including:  CCM service includes personalized support from designated clinical staff supervised by his physician, including individualized plan of care and coordination with other care providers 24/7 contact phone numbers for assistance for urgent and routine care needs. Service will only be billed when office clinical staff spend 20 minutes or more in a month to coordinate care. Only one practitioner may furnish and bill the service in a calendar month. The patient may stop CCM services at any time (effective at the end of the month) by phone call to the office staff. The patient is responsible for co-pay (up to 20% after annual deductible is met) if co-pay is required by the individual health plan.   Patient agreed to services and verbal consent obtained.   Follow up plan: Telephone appointment with care management team member scheduled for: 02/06/2021 and 02/12/2021  Julian Hy, [place] Management  Direct Dial: [phone]

## 2021-02-02 ENCOUNTER — Ambulatory Visit: Payer: Medicare HMO | Admitting: Urology

## 2021-02-02 DIAGNOSIS — M48061 Spinal stenosis, lumbar region without neurogenic claudication: Secondary | ICD-10-CM | POA: Diagnosis not present

## 2021-02-02 DIAGNOSIS — G894 Chronic pain syndrome: Secondary | ICD-10-CM | POA: Diagnosis not present

## 2021-02-02 DIAGNOSIS — M21371 Foot drop, right foot: Secondary | ICD-10-CM | POA: Diagnosis not present

## 2021-02-02 DIAGNOSIS — I6781 Acute cerebrovascular insufficiency: Secondary | ICD-10-CM | POA: Diagnosis not present

## 2021-02-02 DIAGNOSIS — Z9181 History of falling: Secondary | ICD-10-CM | POA: Diagnosis not present

## 2021-02-02 DIAGNOSIS — I69398 Other sequelae of cerebral infarction: Secondary | ICD-10-CM | POA: Diagnosis not present

## 2021-02-02 DIAGNOSIS — Z79899 Other long term (current) drug therapy: Secondary | ICD-10-CM | POA: Diagnosis not present

## 2021-02-02 DIAGNOSIS — F5101 Primary insomnia: Secondary | ICD-10-CM | POA: Diagnosis not present

## 2021-02-02 NOTE — Telephone Encounter (Signed)
Lmtcb for verbal orders. KW 

## 2021-02-05 ENCOUNTER — Telehealth: Payer: Self-pay | Admitting: Family Medicine

## 2021-02-05 ENCOUNTER — Other Ambulatory Visit: Payer: Self-pay | Admitting: Family Medicine

## 2021-02-05 ENCOUNTER — Encounter: Payer: Self-pay | Admitting: Speech Pathology

## 2021-02-05 ENCOUNTER — Ambulatory Visit: Payer: Self-pay

## 2021-02-05 DIAGNOSIS — M48061 Spinal stenosis, lumbar region without neurogenic claudication: Secondary | ICD-10-CM | POA: Diagnosis not present

## 2021-02-05 DIAGNOSIS — I6781 Acute cerebrovascular insufficiency: Secondary | ICD-10-CM | POA: Diagnosis not present

## 2021-02-05 DIAGNOSIS — M21371 Foot drop, right foot: Secondary | ICD-10-CM | POA: Diagnosis not present

## 2021-02-05 DIAGNOSIS — I69398 Other sequelae of cerebral infarction: Secondary | ICD-10-CM | POA: Diagnosis not present

## 2021-02-05 DIAGNOSIS — K1379 Other lesions of oral mucosa: Secondary | ICD-10-CM

## 2021-02-05 DIAGNOSIS — Z79899 Other long term (current) drug therapy: Secondary | ICD-10-CM | POA: Diagnosis not present

## 2021-02-05 DIAGNOSIS — F5101 Primary insomnia: Secondary | ICD-10-CM | POA: Diagnosis not present

## 2021-02-05 DIAGNOSIS — Z9181 History of falling: Secondary | ICD-10-CM | POA: Diagnosis not present

## 2021-02-05 DIAGNOSIS — G894 Chronic pain syndrome: Secondary | ICD-10-CM | POA: Diagnosis not present

## 2021-02-05 NOTE — Telephone Encounter (Signed)
Requested medication (s) are due for refill today - no  Requested medication (s) are on the active medication list -yes  Future visit scheduled -yes  Last refill: Lunesta- 01/30/21 #90                  Magic mouthwash- 02/02/21  Notes to clinic: Request RF: non delegated Rx- duplicate request, no protocol assigned   Requested Prescriptions  Pending Prescriptions Disp Refills   eszopiclone (LUNESTA) 1 MG TABS tablet 90 tablet 0    Sig: Take 1-3 tablets immediately by mouth before bedtime for sleep     Not Delegated - Psychiatry:  Anxiolytics/Hypnotics Failed - 02/05/2021  3:26 PM      Failed - This refill cannot be delegated      Failed - Urine Drug Screen completed in last 360 days      Passed - Valid encounter within last 6 months    Recent Outpatient Visits           1 week ago Nerve pain   [facility] Merita Norton T, FNP   1 month ago Cerebrovascular accident (CVA) due to occlusion of left middle cerebral artery [facility])   [facility] Chrismon, Jodell Cipro, PA-C   11 months ago Chronic pain syndrome   [facility], Jodell Cipro, PA-C   1 year ago Elevated LDL cholesterol level   [facility], Jodell Cipro, PA-C   1 year ago Annual physical exam   [facility], Jodell Cipro, PA-C       Future Appointments             In 1 week Vanna Scotland, MD [facility] [place]   In 2 weeks Jacky Kindle, FNP [facility], [facility]             magic mouthwash (lidocaine, diphenhydrAMINE, alum & mag hydroxide) suspension 360 mL 0    Sig: Swish and spit 5 mLs 4 (four) times daily as needed for mouth pain.     This medication is a mixture and was not evaluated by refill protocols.       Requested Prescriptions  Pending Prescriptions Disp Refills   eszopiclone (LUNESTA) 1 MG TABS tablet 90 tablet 0    Sig: Take 1-3 tablets immediately by mouth before bedtime for  sleep     Not Delegated - Psychiatry:  Anxiolytics/Hypnotics Failed - 02/05/2021  3:26 PM      Failed - This refill cannot be delegated      Failed - Urine Drug Screen completed in last 360 days      Passed - Valid encounter within last 6 months    Recent Outpatient Visits           1 week ago Nerve pain   [facility] Merita Norton T, FNP   1 month ago Cerebrovascular accident (CVA) due to occlusion of left middle cerebral artery [facility])   [facility] Chrismon, Jodell Cipro, PA-C   11 months ago Chronic pain syndrome   [facility], Jodell Cipro, PA-C   1 year ago Elevated LDL cholesterol level   [facility], Jodell Cipro, PA-C   1 year ago Annual physical exam   [facility], Jodell Cipro, PA-C       Future Appointments             In 1 week Vanna Scotland, MD [facility] [place]  In 2 weeks Jacky Kindle, FNP [facility], [facility]             magic mouthwash (lidocaine, diphenhydrAMINE, alum & mag hydroxide) suspension 360 mL 0    Sig: Swish and spit 5 mLs 4 (four) times daily as needed for mouth pain.     This medication is a mixture and was not evaluated by refill protocols.

## 2021-02-05 NOTE — Telephone Encounter (Signed)
Pt is calling to ask how he is to apply the otoinment diclonenac sodium that he received from the hospital on his back by himself. Pt would like to know can order be placed for someone to come apply this to his back.  Please advise CB- [phone]

## 2021-02-05 NOTE — Telephone Encounter (Signed)
Please review.kw 

## 2021-02-05 NOTE — Telephone Encounter (Signed)
Medication: eszopiclone (LUNESTA) 1 MG TABS tablet [[id number]] , magic mouthwash (lidocaine, diphenhydrAMINE, alum & mag hydroxide) suspension [[id number]]   Has the patient contacted their pharmacy? YES Medication was sent to the wrong pharmacy. Can this be resent please?Marland Kitchen  And The pharmacy has no record of the magic mouthwash  (Agent: If no, request that the patient contact the pharmacy for the refill.) (Agent: If yes, when and what did the pharmacy advise?)  Preferred Pharmacy (with phone number or street name): [id number] [place] [place] - [address] [place] [address] Phone: [phone] Fax: [phone] Hours: Not open 24 hours   Has the patient been seen for an appointment in the last year OR does the patient have an upcoming appointment? YES 01/23/21  Agent: Please be advised that RX refills may take up to 3 business days. We ask that you follow-up with your pharmacy.

## 2021-02-06 ENCOUNTER — Ambulatory Visit: Payer: Self-pay

## 2021-02-06 ENCOUNTER — Encounter: Payer: Self-pay | Admitting: Speech Pathology

## 2021-02-06 ENCOUNTER — Ambulatory Visit (INDEPENDENT_AMBULATORY_CARE_PROVIDER_SITE_OTHER): Payer: Medicare HMO | Admitting: *Deleted

## 2021-02-06 DIAGNOSIS — M48061 Spinal stenosis, lumbar region without neurogenic claudication: Secondary | ICD-10-CM | POA: Diagnosis not present

## 2021-02-06 DIAGNOSIS — I6781 Acute cerebrovascular insufficiency: Secondary | ICD-10-CM | POA: Diagnosis not present

## 2021-02-06 DIAGNOSIS — I69398 Other sequelae of cerebral infarction: Secondary | ICD-10-CM | POA: Diagnosis not present

## 2021-02-06 DIAGNOSIS — F064 Anxiety disorder due to known physiological condition: Secondary | ICD-10-CM

## 2021-02-06 DIAGNOSIS — F5101 Primary insomnia: Secondary | ICD-10-CM | POA: Diagnosis not present

## 2021-02-06 DIAGNOSIS — F09 Unspecified mental disorder due to known physiological condition: Secondary | ICD-10-CM

## 2021-02-06 DIAGNOSIS — Z79899 Other long term (current) drug therapy: Secondary | ICD-10-CM | POA: Diagnosis not present

## 2021-02-06 DIAGNOSIS — Z9181 History of falling: Secondary | ICD-10-CM | POA: Diagnosis not present

## 2021-02-06 DIAGNOSIS — G894 Chronic pain syndrome: Secondary | ICD-10-CM | POA: Diagnosis not present

## 2021-02-06 DIAGNOSIS — M792 Neuralgia and neuritis, unspecified: Secondary | ICD-10-CM

## 2021-02-06 DIAGNOSIS — I6930 Unspecified sequelae of cerebral infarction: Secondary | ICD-10-CM

## 2021-02-06 DIAGNOSIS — I10 Essential (primary) hypertension: Secondary | ICD-10-CM

## 2021-02-06 DIAGNOSIS — M21371 Foot drop, right foot: Secondary | ICD-10-CM | POA: Diagnosis not present

## 2021-02-07 ENCOUNTER — Other Ambulatory Visit: Payer: Self-pay | Admitting: *Deleted

## 2021-02-07 ENCOUNTER — Ambulatory Visit: Payer: Self-pay | Admitting: *Deleted

## 2021-02-07 ENCOUNTER — Other Ambulatory Visit: Payer: Self-pay | Admitting: Family Medicine

## 2021-02-07 DIAGNOSIS — Z79899 Other long term (current) drug therapy: Secondary | ICD-10-CM | POA: Diagnosis not present

## 2021-02-07 DIAGNOSIS — F5101 Primary insomnia: Secondary | ICD-10-CM

## 2021-02-07 DIAGNOSIS — K1379 Other lesions of oral mucosa: Secondary | ICD-10-CM

## 2021-02-07 DIAGNOSIS — M21371 Foot drop, right foot: Secondary | ICD-10-CM | POA: Diagnosis not present

## 2021-02-07 DIAGNOSIS — I6781 Acute cerebrovascular insufficiency: Secondary | ICD-10-CM | POA: Diagnosis not present

## 2021-02-07 DIAGNOSIS — I69398 Other sequelae of cerebral infarction: Secondary | ICD-10-CM | POA: Diagnosis not present

## 2021-02-07 DIAGNOSIS — M48061 Spinal stenosis, lumbar region without neurogenic claudication: Secondary | ICD-10-CM | POA: Diagnosis not present

## 2021-02-07 DIAGNOSIS — Z9181 History of falling: Secondary | ICD-10-CM | POA: Diagnosis not present

## 2021-02-07 DIAGNOSIS — G894 Chronic pain syndrome: Secondary | ICD-10-CM | POA: Diagnosis not present

## 2021-02-07 MED ORDER — LIDOCAINE VISCOUS HCL 2 % MT SOLN: 5.0000 mL | Freq: Four times a day (QID) | OROMUCOSAL | 0 refills | Status: DC | PRN

## 2021-02-07 NOTE — Telephone Encounter (Signed)
Pt called to report that he is in extreme pain, and needs this as soon as possible. Transferred to triage   [id number] [place], [place] - [address]  [address] [place] [address]  Phone: [phone] Fax: [phone]

## 2021-02-07 NOTE — Patient Instructions (Signed)
Visit Information   Goals Addressed             This Visit's Progress    Find Help in My Community       Timeframe:  Long-Range Goal Priority:  Medium Start Date:   02/06/21                          Expected End Date:    08/07/20                   Follow Up Date 02/20/21    - call 211 when I need some help - follow-up on any referrals for help I am given - think ahead to make sure my need does not become an emergency - have a back-up plan - make a list of family or friends that I can call    Why is this important?   Knowing how and where to find help for yourself or family in your neighborhood and community is an important skill.  You will want to take some steps to learn how.    Notes:         The patient verbalized understanding of instructions, educational materials, and care plan provided today and declined offer to receive copy of patient instructions, educational materials, and care plan.   Telephone follow up appointment with care management team member scheduled for: 02/20/21   Verna Czech, LCSW Clinical Social Worker  [facility] Care Management [phone]

## 2021-02-07 NOTE — Chronic Care Management (AMB) (Signed)
Chronic Care Management    Clinical Social Work Note  02/07/2021 Name: David Reeves MRN: [id number] DOB: 10-Jan-1971  David Reeves is a 50 y.o. year old male who is a primary care patient of Gwyneth Sprout, FNP. The CCM team was consulted to assist the patient with chronic disease management and/or care coordination needs related to: Transportation Needs  and assistance needs  .   Engaged with patient by telephone for initial visit in response to provider referral for social work chronic care management and care coordination services.   Consent to Services:  The patient was given the following information about Chronic Care Management services today, agreed to services, and gave verbal consent: 1. CCM service includes personalized support from designated clinical staff supervised by the primary care provider, including individualized plan of care and coordination with other care providers 2. 24/7 contact phone numbers for assistance for urgent and routine care needs. 3. Service will only be billed when office clinical staff spend 20 minutes or more in a month to coordinate care. 4. Only one practitioner may furnish and bill the service in a calendar month. 5.The patient may stop CCM services at any time (effective at the end of the month) by phone call to the office staff. 6. The patient will be responsible for cost sharing (co-pay) of up to 20% of the service fee (after annual deductible is met). Patient agreed to services and consent obtained.  Patient agreed to services and consent obtained.   Assessment: Review of patient past medical history, allergies, medications, and health status, including review of relevant consultants reports was performed today as part of a comprehensive evaluation and provision of chronic care management and care coordination services.     SDOH (Social Determinants of Health) assessments and interventions performed:    Advanced Directives Status: Not  addressed in this encounter.  CCM Care Plan  Allergies  Allergen Reactions   Duloxetine     Other reaction(s): Other (See Comments) Increased temperature, sweating, uncontrolled shaking, aggressive thoughts   Amitriptyline Other (See Comments)    Urine retention   Buspirone Other (See Comments)    Kidney pain   Ciprofloxacin Other (See Comments)    hallucinations   Doxepin Other (See Comments)    "Didn't feel right"   Escitalopram Other (See Comments)    Agitation   Olanzapine    Penicillins Other (See Comments)    Unknown -- childhood reaction   Sertraline Other (See Comments)    Urine retention   Trazodone Other (See Comments)    abd pain, urine retention    Outpatient Encounter Medications as of 02/06/2021  Medication Sig   amLODipine (NORVASC) 5 MG tablet Take 1 tablet (5 mg total) by mouth daily.   atorvastatin (LIPITOR) 80 MG tablet Take 1 tablet (80 mg total) by mouth daily.   Cholecalciferol (VITAMIN D3) 2000 units TABS Take by mouth.   donepezil (ARICEPT) 10 MG tablet Take 1 tablet (10 mg total) by mouth at bedtime.   doxycycline (VIBRAMYCIN) 100 MG capsule Take 1 capsule (100 mg total) by mouth 2 (two) times daily.   eszopiclone (LUNESTA) 1 MG TABS tablet Take 1-3 tablets immediately by mouth before bedtime for sleep   gabapentin (NEURONTIN) 300 MG capsule Take 1 capsule (300 mg total) by mouth 3 (three) times daily.   meloxicam (MOBIC) 15 MG tablet Take 1 tablet (15 mg total) by mouth daily.   Multiple Vitamin (MULTIVITAMIN) tablet Take 1 tablet by mouth daily.  sildenafil (VIAGRA) 100 MG tablet Take 1 tablet (100 mg total) by mouth as needed for erectile dysfunction.   tiZANidine (ZANAFLEX) 4 MG tablet Take 1 tablet (4 mg total) by mouth every 6 (six) hours. TAKE 1 TABLET(4 MG) BY MOUTH THREE TIMES DAILY   UNABLE TO FIND cbd   [DISCONTINUED] magic mouthwash (lidocaine, diphenhydrAMINE, alum & mag hydroxide) suspension Swish and spit 5 mLs 4 (four) times daily as  needed for mouth pain.   No facility-administered encounter medications on file as of 02/06/2021.    Patient Active Problem List   Diagnosis Date Noted   Chronic pain syndrome 01/23/2021   Primary hypertension 01/23/2021   Nerve pain 01/23/2021   Erectile dysfunction due to diseases classified elsewhere 01/23/2021   Cognitive dysfunction 01/23/2021   Scrotal edema 01/23/2021   Elevated LDL cholesterol level 01/23/2021   History of stroke with residual deficit 01/23/2021   Primary insomnia 01/23/2021   Mouth pain 01/23/2021   Laceration of right hand without foreign body    Closed compression fracture of L1 lumbar vertebra 05/24/2016   Lumbar stenosis with neurogenic claudication 11/01/2015   Chronic 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 11/08/2014   Hx of hemorrhoids 11/08/2014   AAA (abdominal aortic aneurysm) ([place]) 09/22/2014   Chronic pain associated with significant psychosocial dysfunction 09/22/2014   Panic attack 09/22/2014   AB (asthmatic bronchitis) 08/17/2014   Anxiety 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 08/17/2014   Backache 08/17/2014   Lumbosacral spondylosis without myelopathy 08/17/2014   Disorder of male genital organs 08/17/2014   Brash 08/17/2014   Low back pain 08/17/2014   Tendon nodule 08/17/2014   Episodic paroxysmal anxiety disorder 08/17/2014   Hernia, inguinal, right 08/17/2014   Fast heart beat 08/17/2014   Illness 08/17/2014   Inguinal hernia 10/14/2012    Conditions to be addressed/monitored: Cognitive dysfunction; Limited social support and Transportation  Care Plan : General Social Work (Adult)  Updates made by [employer], Baruch Gouty M, LCSW since 02/07/2021 12:00 AM     Problem: CHL AMB "PATIENT-SPECIFIC PROBLEM"   Note:   CARE PLAN ENTRY (see longitudinal plan of care for additional care plan information)  Current Barriers:  Patient with  cognitive dysfunction  -history of stroke-in need of assistance with connection to  community resources  Knowledge deficits and need for support, education and care coordination related to community resources support  Financial constraints related to current medical condition, Limited social support, Transportation, ADL IADL limitations, Cognitive Deficits, and Lacks knowledge of community resource: Scientist, research (medical))  Over the next 90 days, patient will follow up with the Department of Social Services regarding the status of his Medicaid application as well as [employer] transportation to follow up on the coordination of transportation needs  Interventions provided by LCSW:  Assessed patient's care coordination needs related to need for transportation as well as Medicaid coverage and discussed ongoing care management follow up  Provided patient with information about the [employer] benefit through [employer] and provided patient with the contact number-currently patient's ex-wife assisting with transportation to medical appointments Advised patient to contact [employer] 1 week prior to appointment to arrange transportation to his medical appointments Collaborated with appropriate clinical care team members regarding patient needs Patient interviewed and appropriate assessments performed Assisted patient with Medicaid application-iniital application completed by phone, application to be mailed to patient to review ,sign and return to the Department of Orthoptist for processing Motivational Interviewing employed Emotional Support Provided  Patient Self Care Activities & Deficits:  Patient is unable to independently navigate community resource options without care coordination support  Acknowledges deficits and is motivated to resolve concern  Patient is able to contact [employer] as discussed today Unable to independently transport self to medical appointments, has difficulty with the functioning of his right  side due to stroke Self administers medications as prescribed  Initial goal documentation        Follow Up Plan: SW will follow up with patient by phone over the next 14 business days       [place], [place] Worker  [place] [facility] [phone]

## 2021-02-07 NOTE — Telephone Encounter (Signed)
Requested medication (s) are due for refill today: Yes  Requested medication (s) are on the active medication list: {Yes  Last refill:  Lunesta 1 week ago, Magic mouthwash 2 weeks ago  Future visit scheduled: Yes   Notes to clinic:  Unable to refill per protocol, cannot delegate, Medication not assigned to protocol, resend to different pharmacy.      Requested Prescriptions  Pending Prescriptions Disp Refills   eszopiclone (LUNESTA) 1 MG TABS tablet 90 tablet 0    Sig: Take 1-3 tablets immediately by mouth before bedtime for sleep     Not Delegated - Psychiatry:  Anxiolytics/Hypnotics Failed - 02/07/2021 12:33 PM      Failed - This refill cannot be delegated      Failed - Urine Drug Screen completed in last 360 days      Passed - Valid encounter within last 6 months    Recent Outpatient Visits           2 weeks ago Nerve pain   [facility] Merita Norton T, FNP   1 month ago Cerebrovascular accident (CVA) due to occlusion of left middle cerebral artery [facility])   [facility] Chrismon, Jodell Cipro, PA-C   11 months ago Chronic pain syndrome   [employer] [facility] Jodell Cipro, PA-C   1 year ago Elevated LDL cholesterol level   [employer], Jodell Cipro, PA-C   1 year ago Annual physical exam   [employer], Jodell Cipro, PA-C       Future Appointments             In 1 week Vanna Scotland, MD [facility] [place]   In 2 weeks Jacky Kindle, FNP [facility], [facility]             magic mouthwash (lidocaine, diphenhydrAMINE, alum & mag hydroxide) suspension 360 mL 0    Sig: Swish and spit 5 mLs 4 (four) times daily as needed for mouth pain.     This medication is a mixture and was not evaluated by refill protocols.

## 2021-02-07 NOTE — Telephone Encounter (Signed)
Reason for Disposition  [1] White patches that stick to tongue or inner cheek AND [2] can be wiped off  Answer Assessment - Initial Assessment Questions 1. ONSET: "When did the mouth start hurting?" (e.g., hours or days ago)      Since leaving hospital after a stroke approx 3 weeks ago  2. SEVERITY: "How bad is the pain?" (Scale 1-10; mild, moderate or severe)   - MILD (1-3):  doesn't interfere with eating or normal activities   - MODERATE (4-7): interferes with eating some solids and normal activities   - SEVERE (8-10):  excruciating pain, interferes with most normal activities   - SEVERE DYSPHAGIA: can't swallow liquids, drooling     Moderate  3. SORES: "Are there any sores or ulcers in the mouth?" If Yes, ask: "What part of the mouth are the sores in?"     Ulcers or "white spots" inside right jaw 4. FEVER: "Do you have a fever?" If Yes, ask: "What is your temperature, how was it measured, and when did it start?"     na 5. CAUSE: "What do you think is causing the mouth pain?"     Biting inside of mouth due to stroke 6. OTHER SYMPTOMS: "Do you have any other symptoms?" (e.g., difficulty breathing)     Back pain , insomnia  Protocols used: Mouth Pain-A-AH

## 2021-02-07 NOTE — Telephone Encounter (Signed)
C/o right inner mouth pain and not receiving requested medications, lunesta and magic mouth wash. C/o right inner jaw/ mouth pain since leaving hospital 3 weeks ago and "biting" inner mouth due to stroke. Reports he tries to chew on left side but is difficult. C/o "white spots" noted on inner right side of mouth. Reviewed exercises with patient to assist with muscle strengthening . Instructed patient to notify speech therapy to assist with exercises to decrease chewing/biting inner mouth on right side. Patient reports he was using Magic mouth wash while in hospital and requesting again. C/o insomnia and reports he has not received Lunesta Rx. [facility] [place] [facility]  Requesting sleep study due to insomnia. Reports hx of sleep issues "all my life". C/o continued back pain - chronic and his referral to pain clinic was denied and PCP aware to send another referral. Appt scheduled for 02/22/21. Care advise given . Patient verbalized understanding of care advise and to call back if symptoms worsen. Please advise if any other medications for inner mouth pain.

## 2021-02-08 ENCOUNTER — Other Ambulatory Visit: Payer: Self-pay | Admitting: Family Medicine

## 2021-02-08 ENCOUNTER — Encounter: Payer: Self-pay | Admitting: Speech Pathology

## 2021-02-08 ENCOUNTER — Ambulatory Visit: Payer: Self-pay

## 2021-02-08 DIAGNOSIS — G894 Chronic pain syndrome: Secondary | ICD-10-CM

## 2021-02-08 DIAGNOSIS — Z9181 History of falling: Secondary | ICD-10-CM | POA: Diagnosis not present

## 2021-02-08 DIAGNOSIS — Z79899 Other long term (current) drug therapy: Secondary | ICD-10-CM | POA: Diagnosis not present

## 2021-02-08 DIAGNOSIS — I10 Essential (primary) hypertension: Secondary | ICD-10-CM

## 2021-02-08 DIAGNOSIS — F5101 Primary insomnia: Secondary | ICD-10-CM

## 2021-02-08 DIAGNOSIS — F09 Unspecified mental disorder due to known physiological condition: Secondary | ICD-10-CM

## 2021-02-08 DIAGNOSIS — M21371 Foot drop, right foot: Secondary | ICD-10-CM | POA: Diagnosis not present

## 2021-02-08 DIAGNOSIS — M48061 Spinal stenosis, lumbar region without neurogenic claudication: Secondary | ICD-10-CM | POA: Diagnosis not present

## 2021-02-08 DIAGNOSIS — I69398 Other sequelae of cerebral infarction: Secondary | ICD-10-CM | POA: Diagnosis not present

## 2021-02-08 DIAGNOSIS — I6781 Acute cerebrovascular insufficiency: Secondary | ICD-10-CM | POA: Diagnosis not present

## 2021-02-08 DIAGNOSIS — E7800 Pure hypercholesterolemia, unspecified: Secondary | ICD-10-CM

## 2021-02-08 NOTE — Telephone Encounter (Signed)
Medication Refill - Medication: Amlodipine, atorvastatin, donepezil, meloxicam, tizanidine, belsamra   Has the patient contacted their pharmacy? Yes.   Misty Stanley, from Pharmacy, calling on behalf of pt she states that they have sent over multiple fax and have not received any response. Please advise.  (Agent: If no, request that the patient contact the pharmacy for the refill.) (Agent: If yes, when and what did the pharmacy advise?)  Preferred Pharmacy (with phone number or street name):  [facility] - [place], [place] - [address]  [address] [place] [address]  Phone: [phone] Fax: [phone]  Hours: Not open 24 hours   Has the patient been seen for an appointment in the last year OR does the patient have an upcoming appointment? Yes.    Agent: Please be advised that RX refills may take up to 3 business days. We ask that you follow-up with your pharmacy.

## 2021-02-08 NOTE — Telephone Encounter (Signed)
Requested medication (s) are due for refill today:   Provider to review  Requested medication (s) are on the active medication list:   Yes  Future visit scheduled:   Yes   Last ordered: 01/30/2021 #90, 0 refills  Non delegated refill   Requested Prescriptions  Pending Prescriptions Disp Refills   eszopiclone (LUNESTA) 1 MG TABS tablet 90 tablet 0    Sig: Take 1-3 tablets immediately by mouth before bedtime for sleep     Not Delegated - Psychiatry:  Anxiolytics/Hypnotics Failed - 02/07/2021  1:22 PM      Failed - This refill cannot be delegated      Failed - Urine Drug Screen completed in last 360 days      Passed - Valid encounter within last 6 months    Recent Outpatient Visits           2 weeks ago Nerve pain   [facility] Merita Norton T, FNP   1 month ago Cerebrovascular accident (CVA) due to occlusion of left middle cerebral artery [facility])   [facility] Chrismon, Jodell Cipro, PA-C   12 months ago Chronic pain syndrome   [employer] Jodell Cipro, PA-C   1 year ago Elevated LDL cholesterol level   [employer], Jodell Cipro, PA-C   1 year ago Annual physical exam   [employer], Jodell Cipro, PA-C       Future Appointments             In 1 week Vanna Scotland, MD [facility] [place]   In 2 weeks Jacky Kindle, FNP [facility], [facility]

## 2021-02-09 ENCOUNTER — Other Ambulatory Visit: Payer: Self-pay | Admitting: *Deleted

## 2021-02-09 ENCOUNTER — Other Ambulatory Visit: Payer: Self-pay | Admitting: Family Medicine

## 2021-02-09 DIAGNOSIS — G894 Chronic pain syndrome: Secondary | ICD-10-CM

## 2021-02-09 DIAGNOSIS — M792 Neuralgia and neuritis, unspecified: Secondary | ICD-10-CM

## 2021-02-09 MED ORDER — ATORVASTATIN CALCIUM 80 MG PO TABS: 80.0000 mg | ORAL_TABLET | Freq: Every day | ORAL | 3 refills | Status: DC

## 2021-02-09 MED ORDER — DONEPEZIL HCL 10 MG PO TABS: 10.0000 mg | ORAL_TABLET | Freq: Every day | ORAL | 3 refills | Status: DC

## 2021-02-09 MED ORDER — MELOXICAM 15 MG PO TABS: 15.0000 mg | ORAL_TABLET | Freq: Every day | ORAL | 3 refills | Status: DC

## 2021-02-09 MED ORDER — AMLODIPINE BESYLATE 5 MG PO TABS: 5.0000 mg | ORAL_TABLET | Freq: Every day | ORAL | 3 refills | Status: DC

## 2021-02-09 NOTE — Telephone Encounter (Signed)
Requested medication (s) are due for refill today: No  Requested medication (s) are on the active medication list: Yes  Last refill:  01/23/21  Future visit scheduled: Yes  Notes to clinic:  Pharmacy requests clarification.    Requested Prescriptions  Pending Prescriptions Disp Refills   tiZANidine (ZANAFLEX) 4 MG tablet [Pharmacy Med Name: TIZANIDINE HCL 4 MG TABLET] 360 tablet 3    Sig: TAKE 1 TABLET BY MOUTH EVERY 6 (SIX) HOURS. TAKE 1 TABLET(4 MG) BY MOUTH THREE TIMES DAILY     Not Delegated - Cardiovascular:  Alpha-2 Agonists - tizanidine Failed - 02/08/2021  5:35 PM      Failed - This refill cannot be delegated      Passed - Valid encounter within last 6 months    Recent Outpatient Visits           2 weeks ago Nerve pain   [facility] Merita Norton T, FNP   1 month ago Cerebrovascular accident (CVA) due to occlusion of left middle cerebral artery [facility])   [facility] Chrismon, Jodell Cipro, PA-C   12 months ago Chronic pain syndrome   [employer], Jodell Cipro, PA-C   1 year ago Elevated LDL cholesterol level   [employer], Jodell Cipro, PA-C   1 year ago Annual physical exam   [employer], Jodell Cipro, PA-C       Future Appointments             In 1 week Vanna Scotland, MD [facility] [place]   In 1 week Jacky Kindle, FNP [facility], [facility]

## 2021-02-09 NOTE — Telephone Encounter (Signed)
Copied from CRM [id number]. Topic: General - Other >> Feb 08, 2021  6:28 PM Aretta Nip wrote: This script was called in sating incorrect by pt,tiZANidine (ZANAFLEX) 4 MG tablet 360 tablet 3 01/23/2021   Sig - Route: Take 1 tablet (4 mg total) by mouth every 6 (six) hours. TAKE 1 TABLET(4 MG) BY MOUTH THREE TIMES DAILY - Oral  Sent to pharmacy as: tiZANidine (ZANAFLEX) 4 MG tablet  E-Prescribing Status: Receipt confirmed by pharmacy (01/23/2021 10:58 AM EDT)  Pharmacy also as sent electronic request for clarification, "By mouth every 6 hr=4    By mouth 3 times a day?  =3Please clarify asap as pt has called as med is needed.

## 2021-02-09 NOTE — Telephone Encounter (Signed)
Requested medication (s) are due for refill today: yes  Requested medication (s) are on the active medication list: Yes Zanaflex, No Belsomra  Last refill:  02/09/21  Future visit scheduled: yes  Notes to clinic:  pt has new pharmacy who is requesting meds.  Unable to refill per protocol, cannot delegate.      Requested Prescriptions  Pending Prescriptions Disp Refills   tiZANidine (ZANAFLEX) 4 MG tablet 360 tablet 3    Sig: Take 1 tablet (4 mg total) by mouth every 6 (six) hours. TAKE 1 TABLET(4 MG) BY MOUTH THREE TIMES DAILY     Not Delegated - Cardiovascular:  Alpha-2 Agonists - tizanidine Failed - 02/08/2021  3:17 PM      Failed - This refill cannot be delegated      Passed - Valid encounter within last 6 months    Recent Outpatient Visits           2 weeks ago Nerve pain   [facility] Merita Norton T, FNP   1 month ago Cerebrovascular accident (CVA) due to occlusion of left middle cerebral artery [facility])   [facility] Chrismon, Jodell Cipro, PA-C   12 months ago Chronic pain syndrome   [employer], Jodell Cipro, PA-C   1 year ago Elevated LDL cholesterol level   [employer], Jodell Cipro, PA-C   1 year ago Annual physical exam   [employer], Jodell Cipro, PA-C       Future Appointments             In 1 week Vanna Scotland, MD [facility] [place]   In 1 week Jacky Kindle, FNP [facility]             Suvorexant (BELSOMRA) 5 MG TABS 30 tablet 0    Sig: Take 1 tablet by mouth at bedtime as needed.     Off-Protocol Failed - 02/08/2021  3:17 PM      Failed - Medication not assigned to a protocol, review manually.      Passed - Valid encounter within last 12 months    Recent Outpatient Visits           2 weeks ago Nerve pain   [facility] Merita Norton T, FNP   1 month ago Cerebrovascular accident (CVA) due to occlusion of  left middle cerebral artery [facility])   [facility] Chrismon, Jodell Cipro, PA-C   12 months ago Chronic pain syndrome   [employer], Jodell Cipro, PA-C   1 year ago Elevated LDL cholesterol level   [employer], Jodell Cipro, PA-C   1 year ago Annual physical exam   [employer], Jodell Cipro, PA-C       Future Appointments             In 1 week Vanna Scotland, MD [facility] [place]   In 1 week Jacky Kindle, FNP [facility], [facility]            Signed Prescriptions Disp Refills   amLODipine (NORVASC) 5 MG tablet 90 tablet 3    Sig: Take 1 tablet (5 mg total) by mouth daily.     Cardiovascular:  Calcium Channel Blockers Failed - 02/08/2021  3:17 PM      Failed - Last BP in normal range    BP Readings from Last 1 Encounters:  01/28/21 (!) 136/91  Passed - Valid encounter within last 6 months    Recent Outpatient Visits           2 weeks ago Nerve pain   [facility] Merita Norton T, FNP   1 month ago Cerebrovascular accident (CVA) due to occlusion of left middle cerebral artery [facility])   [facility] Chrismon, Jodell Cipro, PA-C   12 months ago Chronic pain syndrome   [employer], Jodell Cipro, PA-C   1 year ago Elevated LDL cholesterol level   [employer], Jodell Cipro, PA-C   1 year ago Annual physical exam   [employer], Jodell Cipro, PA-C       Future Appointments             In 1 week Vanna Scotland, MD [facility] [place]   In 1 week Jacky Kindle, FNP [facility], [facility]             atorvastatin (LIPITOR) 80 MG tablet 90 tablet 3    Sig: Take 1 tablet (80 mg total) by mouth daily.     Cardiovascular:  Antilipid - Statins Failed - 02/08/2021  3:17 PM      Failed - Total Cholesterol in normal range and within 360 days    Cholesterol,  Total  Date Value Ref Range Status  12/14/2020 246 (H) 100 - 199 mg/dL Final          Failed - LDL in normal range and within 360 days    LDL Chol Calc (NIH)  Date Value Ref Range Status  12/14/2020 179 (H) 0 - 99 mg/dL Final          Passed - HDL in normal range and within 360 days    HDL  Date Value Ref Range Status  12/14/2020 43 >39 mg/dL Final          Passed - Triglycerides in normal range and within 360 days    Triglycerides  Date Value Ref Range Status  12/14/2020 129 0 - 149 mg/dL Final          Passed - Patient is not pregnant      Passed - Valid encounter within last 12 months    Recent Outpatient Visits           2 weeks ago Nerve pain   [facility] Merita Norton T, FNP   1 month ago Cerebrovascular accident (CVA) due to occlusion of left middle cerebral artery [facility])   [facility] Chrismon, Jodell Cipro, PA-C   12 months ago Chronic pain syndrome   [employer], Jodell Cipro, PA-C   1 year ago Elevated LDL cholesterol level   [employer], Jodell Cipro, PA-C   1 year ago Annual physical exam   [employer], Jodell Cipro, PA-C       Future Appointments             In 1 week Vanna Scotland, MD [facility] [place]   In 1 week Jacky Kindle, FNP [facility], [facility]             donepezil (ARICEPT) 10 MG tablet 90 tablet 3    Sig: Take 1 tablet (10 mg total) by mouth at bedtime.     Neurology:  Alzheimer's Agents Passed - 02/08/2021  3:17 PM      Passed - Valid encounter within last 6 months  Recent Outpatient Visits           2 weeks ago Nerve pain   [facility] Merita Norton T, FNP   1 month ago Cerebrovascular accident (CVA) due to occlusion of left middle cerebral artery [facility])   [facility] Chrismon, Jodell Cipro, PA-C   12 months ago Chronic pain syndrome   [employer], Jodell Cipro, PA-C   1 year ago Elevated LDL cholesterol level   [employer], Jodell Cipro, PA-C   1 year ago Annual physical exam   [employer], Jodell Cipro, PA-C       Future Appointments             In 1 week Vanna Scotland, MD [facility] [place]   In 1 week Jacky Kindle, FNP [facility], [facility]             meloxicam (MOBIC) 15 MG tablet 90 tablet 3    Sig: Take 1 tablet (15 mg total) by mouth daily.     Analgesics:  COX2 Inhibitors Passed - 02/08/2021  3:17 PM      Passed - HGB in normal range and within 360 days    Hemoglobin  Date Value Ref Range Status  12/14/2020 15.6 13.0 - 17.7 g/dL Final          Passed - Cr in normal range and within 360 days    Creatinine  Date Value Ref Range Status  11/14/2013 0.85 0.60 - 1.30 mg/dL Final   Creatinine, Ser  Date Value Ref Range Status  12/14/2020 0.80 0.76 - 1.27 mg/dL Final          Passed - Patient is not pregnant      Passed - Valid encounter within last 12 months    Recent Outpatient Visits           2 weeks ago Nerve pain   [facility] Merita Norton T, FNP   1 month ago Cerebrovascular accident (CVA) due to occlusion of left middle cerebral artery [facility])   [facility] Chrismon, Jodell Cipro, PA-C   12 months ago Chronic pain syndrome   [employer], Jodell Cipro, PA-C   1 year ago Elevated LDL cholesterol level   [employer], Jodell Cipro, PA-C   1 year ago Annual physical exam   [employer], Jodell Cipro, PA-C       Future Appointments             In 1 week Vanna Scotland, MD [facility] [place]   In 1 week Jacky Kindle, FNP [facility], [facility]

## 2021-02-12 ENCOUNTER — Ambulatory Visit: Payer: Medicare HMO

## 2021-02-12 DIAGNOSIS — I6930 Unspecified sequelae of cerebral infarction: Secondary | ICD-10-CM

## 2021-02-12 DIAGNOSIS — G894 Chronic pain syndrome: Secondary | ICD-10-CM

## 2021-02-12 DIAGNOSIS — E7800 Pure hypercholesterolemia, unspecified: Secondary | ICD-10-CM

## 2021-02-12 DIAGNOSIS — I10 Essential (primary) hypertension: Secondary | ICD-10-CM

## 2021-02-12 NOTE — Telephone Encounter (Signed)
Copied from CRM [id number]. Topic: General - Other >> Feb 12, 2021  8:16 AM Traci Sermon wrote: Reason for CRM: Pt called in stating he contacting the pharmacy multiple times about his medication and there has still been issues, pt requested if someone could give them a call and get it fixed, pt also stated he would like to speak with a supervisor to speak about this, please advise.  eszopiclone (LUNESTA) 1 MG TABS tablet  tiZANidine (ZANAFLEX) 4 MG tablet

## 2021-02-12 NOTE — Patient Instructions (Addendum)
Thank you for allowing the Chronic Care Management team to participate in your care.    Patient Care Plan: RN Care Manager Plan of Care     Problem Identified: Hypertension and Hyperlipidemia      Long-Range Goal: Disease Progression Prevented or Minimized   Start Date: 02/12/2021  Expected End Date: 05/13/2021  Priority: High  Note:   Current Barriers:  Chronic Disease Management support and education needs related to HTN and HLD   RNCM Clinical Goal(s):  Patient will adhere to treatment plan for management of HTN and HLD Patient will work with the [employer] to discuss options for transportation and nutrition assistance.   Interventions: 1:1 collaboration with primary care provider regarding development and update of comprehensive plan of care as evidenced by provider attestation and co-signature Inter-disciplinary care team collaboration (see longitudinal plan of care) Evaluation of current treatment plan related to  self management and patient's adherence to plan as established by provider  Hypertension Interventions: Last practice recorded BP readings:  BP Readings from Last 3 Encounters:  02/16/21 127/77  01/28/21 (!) 136/91  01/23/21 117/77  Most recent eGFR/CrCl:  Lab Results  Component Value Date   EGFR 108 12/14/2020    No components found for: CRCL    Evaluation of current treatment plan related to hypertension self management and patient's adherence to plan as established by provider; Provided education to patient re: stroke prevention, s/s of heart attack and stroke; Reviewed medications with and discussed importance of compliance. Advised to update the care management team with concerns regarding prescription cost. Discussed established BP parameters and indications for notifying a provider. Advised to monitor BP daily and record readings. Reports his BP is currently being monitored by the Home Health staff. Reports readings have been within  range. Provided education regarding heart health/cardiac prudent food options. Advised to more read nutrition labels, monitor sodium intake and avoid highly processed foods when possible.  Discussed importance of engaging in physical activity. His activity is currently limited chronic pain. He is currently receiving Home Health Physical Therapy.  Discussed complications of poorly controlled blood pressure such as heart disease, stroke, circulatory complications, vision complications and kidney impairment.   Hyperlipidemia Interventions:  Lab Results  Component Value Date   CHOL 246 (H) 12/14/2020   HDL 43 12/14/2020   LDLCALC 179 (H) 12/14/2020   TRIG 129 12/14/2020   CHOLHDL 5.7 (H) 12/14/2020    Discussed importance of taking medications as prescribed. Advised to update provider if unable to tolerate prescribed regimen. Reviewed provider established cholesterol goals reviewed. Counseled on importance of regular laboratory monitoring as prescribed. Reviewed importance of limiting foods high in cholesterol.   Patient Goals/Self-Care Activities: Patient will self administer medications as prescribed Patient will attend all scheduled provider appointments Patient will complete labs as advised Patient will call pharmacy for medication refills Patient will continue to perform ADL's independently   Follow Up Plan: Will follow up next month      Consent to Services:  The patient was given the following information about Chronic Care Management services: 1. CCM service includes personalized support from designated clinical staff supervised by the primary care provider, including individualized plan of care and coordination with other care providers 2. 24/7 contact phone numbers for assistance for urgent and routine care needs. 3. Service will only be billed when office clinical staff spend 20 minutes or more in a month to coordinate care. 4. Only one practitioner may furnish and bill the  service in a  calendar month. 5.The patient may stop CCM services at any time (effective at the end of the month) by phone call to the office staff. 6. The patient will be responsible for cost sharing (co-pay) of up to 20% of the service fee (after annual deductible is met). Patient agreed to services and consent obtained.   Mr. Lafond verbalized understanding of the information discussed during the telephonic outreach. Declined need for mailed/printed instructions. A member of the care management team will follow up next month.    Cristy Friedlander [facility] Care Management [facility] [phone]

## 2021-02-12 NOTE — Chronic Care Management (AMB) (Signed)
Chronic Care Management   CCM RN Visit Note  02/12/2021 Name: David Reeves MRN: [id number] DOB: [birth year]  Subjective: David Reeves is a 50 y.o. year old male who is a primary care patient of Gwyneth Sprout, FNP. The care management team was consulted for assistance with disease management and care coordination needs.    Engaged with patient by telephone for initial visit in response to provider referral for case management an care coordination services.   Consent to Services:  The patient was given the following information about Chronic Care Management services: 1. CCM service includes personalized support from designated clinical staff supervised by the primary care provider, including individualized plan of care and coordination with other care providers 2. 24/7 contact phone numbers for assistance for urgent and routine care needs. 3. Service will only be billed when office clinical staff spend 20 minutes or more in a month to coordinate care. 4. Only one practitioner may furnish and bill the service in a calendar month. 5.The patient may stop CCM services at any time (effective at the end of the month) by phone call to the office staff. 6. The patient will be responsible for cost sharing (co-pay) of up to 20% of the service fee (after annual deductible is met). Patient agreed to services and consent obtained.  Assessment: Review of patient past medical history, allergies, medications, health status, including review of consultants reports, laboratory and other test data, was performed as part of comprehensive evaluation and provision of chronic care management services.   SDOH (Social Determinants of Health) assessments and interventions performed:  SDOH Interventions    Flowsheet Row Most Recent Value  SDOH Interventions   Food Insecurity Interventions [id number] Referral  [Requesting assistance with nutrition resources and prepared meals.]  Transportation Interventions  [id number] Referral        CCM Care Plan  Allergies  Allergen Reactions   Duloxetine     Other reaction(s): Other (See Comments) Increased temperature, sweating, uncontrolled shaking, aggressive thoughts   Amitriptyline Other (See Comments)    Urine retention   Buspirone Other (See Comments)    Kidney pain   Ciprofloxacin Other (See Comments)    hallucinations   Doxepin Other (See Comments)    "Didn't feel right"   Escitalopram Other (See Comments)    Agitation   Olanzapine    Penicillins Other (See Comments)    Unknown -- childhood reaction   Sertraline Other (See Comments)    Urine retention   Trazodone Other (See Comments)    abd pain, urine retention    Outpatient Encounter Medications as of 02/12/2021  Medication Sig Note   amLODipine (NORVASC) 5 MG tablet Take 1 tablet (5 mg total) by mouth daily.    atorvastatin (LIPITOR) 80 MG tablet Take 1 tablet (80 mg total) by mouth daily.    Cholecalciferol (VITAMIN D3) 2000 units TABS Take by mouth.    donepezil (ARICEPT) 10 MG tablet Take 1 tablet (10 mg total) by mouth at bedtime.    meloxicam (MOBIC) 15 MG tablet Take 1 tablet (15 mg total) by mouth daily.    Multiple Vitamin (MULTIVITAMIN) tablet Take 1 tablet by mouth daily.    tiZANidine (ZANAFLEX) 4 MG tablet TAKE 1 TABLET BY MOUTH EVERY 6 (SIX) HOURS. TAKE 1 TABLET(4 MG) BY MOUTH THREE TIMES DAILY    doxycycline (VIBRAMYCIN) 100 MG capsule Take 1 capsule (100 mg total) by mouth 2 (two) times daily. (Patient not taking: Reported on 02/12/2021) 02/12/2021: Reports completing course  today.   eszopiclone (LUNESTA) 1 MG TABS tablet Take 1-3 tablets immediately by mouth before bedtime for sleep 02/12/2021: Reports waiting to receive from pharmacy   gabapentin (NEURONTIN) 300 MG capsule TAKE 1 CAPSULE BY MOUTH THREE TIMES A DAY    magic mouthwash (lidocaine, diphenhydrAMINE, alum & mag hydroxide) suspension Swish and spit 5 mLs 4 (four) times daily as needed for mouth pain.  (Patient not taking: Reported on 02/12/2021)    sildenafil (VIAGRA) 100 MG tablet Take 1 tablet (100 mg total) by mouth as needed for erectile dysfunction.    UNABLE TO FIND cbd    No facility-administered encounter medications on file as of 02/12/2021.    Patient Active Problem List   Diagnosis Date Noted   Chronic pain syndrome 01/23/2021   Primary hypertension 01/23/2021   Nerve pain 01/23/2021   Erectile dysfunction due to diseases classified elsewhere 01/23/2021   Cognitive dysfunction 01/23/2021   Scrotal edema 01/23/2021   Elevated LDL cholesterol level 01/23/2021   History of stroke with residual deficit 01/23/2021   Primary insomnia 01/23/2021   Mouth pain 01/23/2021   Laceration of right hand without foreign body    Closed compression fracture of L1 lumbar vertebra 05/24/2016   Lumbar stenosis with neurogenic claudication 11/01/2015   Chronic 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 11/08/2014   Hx of hemorrhoids 11/08/2014   AAA (abdominal aortic aneurysm) ([place]) 09/22/2014   Chronic pain associated with significant psychosocial dysfunction 09/22/2014   Panic attack 09/22/2014   AB (asthmatic bronchitis) 08/17/2014   Anxiety 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 08/17/2014   Backache 08/17/2014   Lumbosacral spondylosis without myelopathy 08/17/2014   Disorder of male genital organs 08/17/2014   Brash 08/17/2014   Low back pain 08/17/2014   Tendon nodule 08/17/2014   Episodic paroxysmal anxiety disorder 08/17/2014   Hernia, inguinal, right 08/17/2014   Fast heart beat 08/17/2014   Illness 08/17/2014   Inguinal hernia 10/14/2012    Conditions to be addressed/monitored:HTN and HLD  Patient Care Plan: RN Care Manager Plan of Care     Problem Identified: Hypertension and Hyperlipidemia      Long-Range Goal: Disease Progression Prevented or Minimized   Start Date: 02/12/2021  Expected End Date: 05/13/2021  Priority: High  Note:   Current  Barriers:  Chronic Disease Management support and education needs related to HTN and HLD   RNCM Clinical Goal(s):  Patient will adhere to treatment plan for management of HTN and HLD Patient will work with the [employer] to discuss options for transportation and nutrition assistance.   Interventions: 1:1 collaboration with primary care provider regarding development and update of comprehensive plan of care as evidenced by provider attestation and co-signature Inter-disciplinary care team collaboration (see longitudinal plan of care) Evaluation of current treatment plan related to  self management and patient's adherence to plan as established by provider  Hypertension Interventions: Last practice recorded BP readings:  BP Readings from Last 3 Encounters:  02/16/21 127/77  01/28/21 (!) 136/91  01/23/21 117/77  Most recent eGFR/CrCl:  Lab Results  Component Value Date   EGFR 108 12/14/2020    No components found for: CRCL    Evaluation of current treatment plan related to hypertension self management and patient's adherence to plan as established by provider; Provided education to patient re: stroke prevention, s/s of heart attack and stroke; Reviewed medications with and discussed importance of compliance. Advised to update the care management team with concerns regarding prescription cost. Discussed  established BP parameters and indications for notifying a provider. Advised to monitor BP daily and record readings. Reports his BP is currently being monitored by the Home Health staff. Reports readings have been within range. Provided education regarding heart health/cardiac prudent food options. Advised to more read nutrition labels, monitor sodium intake and avoid highly processed foods when possible.  Discussed importance of engaging in physical activity. His activity is currently limited chronic pain. He is currently receiving Home Health Physical Therapy.  Discussed  complications of poorly controlled blood pressure such as heart disease, stroke, circulatory complications, vision complications and kidney impairment.   Hyperlipidemia Interventions:  Lab Results  Component Value Date   CHOL 246 (H) 12/14/2020   HDL 43 12/14/2020   LDLCALC 179 (H) 12/14/2020   TRIG 129 12/14/2020   CHOLHDL 5.7 (H) 12/14/2020    Discussed importance of taking medications as prescribed. Advised to update provider if unable to tolerate prescribed regimen. Reviewed provider established cholesterol goals reviewed. Counseled on importance of regular laboratory monitoring as prescribed. Reviewed importance of limiting foods high in cholesterol.   Patient Goals/Self-Care Activities: Patient will self administer medications as prescribed Patient will attend all scheduled provider appointments Patient will complete labs as advised Patient will call pharmacy for medication refills Patient will continue to perform ADL's independently   Follow Up Plan: Will follow up next month       PLAN Referral placed for assistance with nutrition and transportation. A member of the care management team will follow up next month.   Cristy Friedlander [facility] Care Management [facility] [phone]

## 2021-02-13 ENCOUNTER — Encounter: Payer: Medicare HMO | Admitting: Speech Pathology

## 2021-02-13 ENCOUNTER — Encounter: Payer: Self-pay | Admitting: *Deleted

## 2021-02-13 ENCOUNTER — Telehealth: Payer: Self-pay

## 2021-02-13 ENCOUNTER — Ambulatory Visit: Payer: Medicare HMO | Admitting: Physical Therapy

## 2021-02-13 ENCOUNTER — Other Ambulatory Visit: Payer: Self-pay | Admitting: Family Medicine

## 2021-02-13 DIAGNOSIS — F5101 Primary insomnia: Secondary | ICD-10-CM | POA: Diagnosis not present

## 2021-02-13 DIAGNOSIS — M21371 Foot drop, right foot: Secondary | ICD-10-CM | POA: Diagnosis not present

## 2021-02-13 DIAGNOSIS — Z79899 Other long term (current) drug therapy: Secondary | ICD-10-CM | POA: Diagnosis not present

## 2021-02-13 DIAGNOSIS — G894 Chronic pain syndrome: Secondary | ICD-10-CM | POA: Diagnosis not present

## 2021-02-13 DIAGNOSIS — Z9181 History of falling: Secondary | ICD-10-CM | POA: Diagnosis not present

## 2021-02-13 DIAGNOSIS — I69398 Other sequelae of cerebral infarction: Secondary | ICD-10-CM | POA: Diagnosis not present

## 2021-02-13 DIAGNOSIS — I6781 Acute cerebrovascular insufficiency: Secondary | ICD-10-CM | POA: Diagnosis not present

## 2021-02-13 DIAGNOSIS — M48061 Spinal stenosis, lumbar region without neurogenic claudication: Secondary | ICD-10-CM | POA: Diagnosis not present

## 2021-02-13 MED ORDER — ESZOPICLONE 1 MG PO TABS: ORAL_TABLET | ORAL | 0 refills | Status: DC

## 2021-02-13 NOTE — Telephone Encounter (Signed)
Copied from CRM [id number]. Topic: Referral - Status >> Feb 12, 2021  1:14 PM Wyonia Hough E wrote: Reason for CRM: Arrianna from [facility] called and stated that they dont treat for pain at their location/ they can do a test to see if they can tell where pain is coming from but that is all/ Pt will need to be referred to another location /she spoke with pt and he wants pain management / please advise

## 2021-02-13 NOTE — Telephone Encounter (Signed)
Copied from CRM [id number]. Topic: General - Other >> Feb 13, 2021  4:27 PM Marylen Ponto wrote: Reason for CRM: Pt stated the Rx for tiZANidine (ZANAFLEX) 4 MG tablet was increased but the Rx that was sent to his pharmacy was not updated. Pt stated the Rx was suppose to be increased to 4 tablets daily instead of 3 tablets daily. Pt requests Rx be corrected and a return call at [phone]

## 2021-02-13 NOTE — Telephone Encounter (Signed)
Please review message. KW 

## 2021-02-13 NOTE — Telephone Encounter (Signed)
Copied from CRM [id number]. Topic: General - Other >> Feb 13, 2021 11:03 AM Traci Sermon wrote: Reason for CRM: Pt called in stating he wanted to speak with Delice Bison, pt did want to say why, please advise. >> Feb 13, 2021 11:52 AM Marylen Ponto wrote: Pt requested to speak with Delice Bison. Pt declined to provide more details and asked that Delice Bison return his call. Cb# [phone]

## 2021-02-14 ENCOUNTER — Telehealth: Payer: Self-pay | Admitting: Family Medicine

## 2021-02-14 ENCOUNTER — Other Ambulatory Visit: Payer: Self-pay | Admitting: Family Medicine

## 2021-02-14 ENCOUNTER — Ambulatory Visit: Payer: Medicare HMO | Admitting: *Deleted

## 2021-02-14 DIAGNOSIS — M48061 Spinal stenosis, lumbar region without neurogenic claudication: Secondary | ICD-10-CM | POA: Diagnosis not present

## 2021-02-14 DIAGNOSIS — G894 Chronic pain syndrome: Secondary | ICD-10-CM

## 2021-02-14 DIAGNOSIS — M21371 Foot drop, right foot: Secondary | ICD-10-CM | POA: Diagnosis not present

## 2021-02-14 DIAGNOSIS — F09 Unspecified mental disorder due to known physiological condition: Secondary | ICD-10-CM

## 2021-02-14 DIAGNOSIS — I69398 Other sequelae of cerebral infarction: Secondary | ICD-10-CM | POA: Diagnosis not present

## 2021-02-14 DIAGNOSIS — Z9181 History of falling: Secondary | ICD-10-CM | POA: Diagnosis not present

## 2021-02-14 DIAGNOSIS — I10 Essential (primary) hypertension: Secondary | ICD-10-CM

## 2021-02-14 DIAGNOSIS — I6781 Acute cerebrovascular insufficiency: Secondary | ICD-10-CM | POA: Diagnosis not present

## 2021-02-14 DIAGNOSIS — F5101 Primary insomnia: Secondary | ICD-10-CM | POA: Diagnosis not present

## 2021-02-14 DIAGNOSIS — Z79899 Other long term (current) drug therapy: Secondary | ICD-10-CM | POA: Diagnosis not present

## 2021-02-14 MED ORDER — ZOLPIDEM TARTRATE 10 MG PO TABS: 10.0000 mg | ORAL_TABLET | Freq: Every day | ORAL | 0 refills | Status: DC

## 2021-02-14 MED ORDER — TIZANIDINE HCL 4 MG PO TABS: 4.0000 mg | ORAL_TABLET | Freq: Four times a day (QID) | ORAL | 3 refills | Status: DC

## 2021-02-14 NOTE — Telephone Encounter (Signed)
Pt states the sleep medication eszopiclone (LUNESTA) 1 MG TABS tablet  Was $982.00. it is not covered by medicare at all. Pt states please try something else.  He says Ambien is covered by medicare.  Pt states he is desperate for sleep, and he would appreciate something called in today.  [id number] - [place],  - [address]

## 2021-02-14 NOTE — Telephone Encounter (Signed)
Patient advised he does not want to start Belsorma and states that he wants to be on Ambien to help. KW

## 2021-02-14 NOTE — Chronic Care Management (AMB) (Signed)
Chronic Care Management    Clinical Social Work Note  02/14/2021 Name: David Reeves MRN: [id number] DOB: 1970/11/05  David Reeves is a 50 y.o. year old male who is a primary care patient of David Kindle, FNP. The CCM team was consulted to assist the patient with chronic disease management and/or care coordination needs related to: [employer] .   Engaged with patient by telephone for follow up visit in response to provider referral for social work chronic care management and care coordination services.   Consent to Services:  The patient was given information about Chronic Care Management services, agreed to services, and gave verbal consent prior to initiation of services.  Please see initial visit note for detailed documentation.   Patient agreed to services and consent obtained.   Assessment: Review of patient past medical history, allergies, medications, and health status, including review of relevant consultants reports was performed today as part of a comprehensive evaluation and provision of chronic care management and care coordination services.     SDOH (Social Determinants of Health) assessments and interventions performed:    Advanced Directives Status: Not addressed in this encounter.  CCM Care Plan  Allergies  Allergen Reactions   Duloxetine     Other reaction(s): Other (See Comments) Increased temperature, sweating, uncontrolled shaking, aggressive thoughts   Amitriptyline Other (See Comments)    Urine retention   Buspirone Other (See Comments)    Kidney pain   Ciprofloxacin Other (See Comments)    hallucinations   Doxepin Other (See Comments)    "Didn't feel right"   Escitalopram Other (See Comments)    Agitation   Olanzapine    Penicillins Other (See Comments)    Unknown -- childhood reaction   Sertraline Other (See Comments)    Urine retention   Trazodone Other (See Comments)    abd pain, urine retention    Outpatient  Encounter Medications as of 02/14/2021  Medication Sig Note   amLODipine (NORVASC) 5 MG tablet Take 1 tablet (5 mg total) by mouth daily.    atorvastatin (LIPITOR) 80 MG tablet Take 1 tablet (80 mg total) by mouth daily.    Cholecalciferol (VITAMIN D3) 2000 units TABS Take by mouth.    donepezil (ARICEPT) 10 MG tablet Take 1 tablet (10 mg total) by mouth at bedtime.    gabapentin (NEURONTIN) 300 MG capsule TAKE 1 CAPSULE BY MOUTH THREE TIMES A DAY    meloxicam (MOBIC) 15 MG tablet Take 1 tablet (15 mg total) by mouth daily.    Multiple Vitamin (MULTIVITAMIN) tablet Take 1 tablet by mouth daily.    sildenafil (VIAGRA) 100 MG tablet Take 1 tablet (100 mg total) by mouth as needed for erectile dysfunction.    tiZANidine (ZANAFLEX) 4 MG tablet Take 1 tablet (4 mg total) by mouth in the morning, at noon, in the evening, and at bedtime.    UNABLE TO FIND cbd    zolpidem (AMBIEN) 10 MG tablet Take 1 tablet (10 mg total) by mouth at bedtime.    [DISCONTINUED] doxycycline (VIBRAMYCIN) 100 MG capsule Take 1 capsule (100 mg total) by mouth 2 (two) times daily. (Patient not taking: Reported on 02/12/2021) 02/12/2021: Reports completing course today.   [DISCONTINUED] eszopiclone (LUNESTA) 1 MG TABS tablet Take 1-3 tablets immediately by mouth before bedtime for sleep    [DISCONTINUED] magic mouthwash (lidocaine, diphenhydrAMINE, alum & mag hydroxide) suspension Swish and spit 5 mLs 4 (four) times daily as needed for mouth pain. (Patient not taking:  Reported on 02/12/2021)    [DISCONTINUED] tiZANidine (ZANAFLEX) 4 MG tablet TAKE 1 TABLET BY MOUTH EVERY 6 (SIX) HOURS. TAKE 1 TABLET(4 MG) BY MOUTH THREE TIMES DAILY    No facility-administered encounter medications on file as of 02/14/2021.    Patient Active Problem List   Diagnosis Date Noted   Chronic pain syndrome 01/23/2021   Primary hypertension 01/23/2021   Nerve pain 01/23/2021   Erectile dysfunction due to diseases classified elsewhere 01/23/2021    Cognitive dysfunction 01/23/2021   Scrotal edema 01/23/2021   Elevated LDL cholesterol level 01/23/2021   History of stroke with residual deficit 01/23/2021   Primary insomnia 01/23/2021   Mouth pain 01/23/2021   Laceration of right hand without foreign body    Closed compression fracture of L1 lumbar vertebra 05/24/2016   Lumbar stenosis with neurogenic claudication 11/01/2015   Chronic 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 11/08/2014   Hx of hemorrhoids 11/08/2014   AAA (abdominal aortic aneurysm) (HCC) 09/22/2014   Chronic pain associated with significant psychosocial dysfunction 09/22/2014   Panic attack 09/22/2014   AB (asthmatic bronchitis) 08/17/2014   Anxiety 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 08/17/2014   Backache 08/17/2014   Lumbosacral spondylosis without myelopathy 08/17/2014   Disorder of male genital organs 08/17/2014   Brash 08/17/2014   Low back pain 08/17/2014   Tendon nodule 08/17/2014   Episodic paroxysmal anxiety disorder 08/17/2014   Hernia, inguinal, right 08/17/2014   Fast heart beat 08/17/2014   Illness 08/17/2014   Inguinal hernia 10/14/2012    Conditions to be addressed/monitored:  Cognitive dysfunction; Limited social support and Transportation Care Plan : General Social Work (Adult)  Updates made by [employer], Rushie Chestnut M, LCSW since 02/14/2021 12:00 AM     Problem: [facility] AMB "PATIENT-SPECIFIC PROBLEM"   Priority: Medium  Note:   CARE PLAN ENTRY (see longitudinal plan of care for additional care plan information)  Current Barriers:  Patient with  cognitive dysfunction  -history of stroke-in need of assistance with connection to community resources  Knowledge deficits and need for support, education and care coordination related to community resources support  Financial constraints related to current medical condition, Limited social support, Transportation, ADL IADL limitations, Cognitive Deficits, and Lacks knowledge of community  resource: Surveyor, quantity)  Over the next 90 days, patient will follow up with the Department of Social Services regarding the status of his Medicaid application as well as [employer] transportation to follow up on the coordination of transportation needs  Interventions provided by LCSW:  Assessed patient's care coordination needs related to need for transportation as well as Medicaid coverage and discussed ongoing care management follow up  Confirmed that patient has contacted [employer] and has enrolled for transportation to future medical appointments Followed up on Medicaid application-patient has not received application yet but was instructed to sign the application and return to the Department of Social Services for processing Confirmed that patient is active with home health PT, OT and speech-doing well Positive reinforcement provided regarding coordination of Transportation to medical appointment through [employer] and active participation in [facility] services CCM social worker to continue to follow up on status of medicaid application   Patient Self Care Activities & Deficits:  Patient is unable to independently navigate community resource options without care coordination support  Acknowledges deficits and is motivated to resolve concern  Patient is able to contact [employer] as discussed today Unable to independently transport self to medical appointments, has difficulty with  the functioning of his right side due to stroke Self administers medications as prescribed  Initial goal documentation        Follow Up Plan: SW will follow up with patient by phone over the next 14 business days       [place], [place] Clinical Social Worker  [facility] Care Management [phone]

## 2021-02-14 NOTE — Patient Instructions (Signed)
Visit Information   Goals Addressed             This Visit's Progress    Find Help in My Community       Timeframe:  Long-Range Goal Priority:  Medium Start Date:   02/06/21                          Expected End Date:    08/07/20                   Follow Up Date 02/22/21    - call 211 when I need some help - follow-up on any referrals for help I am given - think ahead to make sure my need does not become an emergency - have a back-up plan - make a list of family or friends that I can call  -sign and return completed Medicaid application to the Department of Social Services once received   Why is this important?   Knowing how and where to find help for yourself or family in your neighborhood and community is an important skill.  You will want to take some steps to learn how.    Notes:         The patient verbalized understanding of instructions, educational materials, and care plan provided today and declined offer to receive copy of patient instructions, educational materials, and care plan.   Telephone follow up appointment with care management team member scheduled for: 02/22/21   Verna Czech, LCSW Clinical Social Worker  [facility] Care Management [phone]

## 2021-02-15 ENCOUNTER — Telehealth: Payer: Medicare HMO | Admitting: *Deleted

## 2021-02-15 ENCOUNTER — Ambulatory Visit: Payer: Medicare HMO

## 2021-02-15 DIAGNOSIS — M21371 Foot drop, right foot: Secondary | ICD-10-CM | POA: Diagnosis not present

## 2021-02-15 DIAGNOSIS — F5101 Primary insomnia: Secondary | ICD-10-CM | POA: Diagnosis not present

## 2021-02-15 DIAGNOSIS — I69398 Other sequelae of cerebral infarction: Secondary | ICD-10-CM | POA: Diagnosis not present

## 2021-02-15 DIAGNOSIS — M48061 Spinal stenosis, lumbar region without neurogenic claudication: Secondary | ICD-10-CM | POA: Diagnosis not present

## 2021-02-15 DIAGNOSIS — Z79899 Other long term (current) drug therapy: Secondary | ICD-10-CM | POA: Diagnosis not present

## 2021-02-15 DIAGNOSIS — Z9181 History of falling: Secondary | ICD-10-CM | POA: Diagnosis not present

## 2021-02-15 DIAGNOSIS — I6781 Acute cerebrovascular insufficiency: Secondary | ICD-10-CM | POA: Diagnosis not present

## 2021-02-15 DIAGNOSIS — G894 Chronic pain syndrome: Secondary | ICD-10-CM | POA: Diagnosis not present

## 2021-02-16 ENCOUNTER — Encounter: Payer: Self-pay | Admitting: Urology

## 2021-02-16 ENCOUNTER — Ambulatory Visit: Payer: Medicare HMO | Admitting: Urology

## 2021-02-16 ENCOUNTER — Other Ambulatory Visit: Payer: Self-pay

## 2021-02-16 VITALS — BP 127/77 | HR 76 | Ht 73.0 in | Wt 225.0 lb

## 2021-02-16 DIAGNOSIS — F5101 Primary insomnia: Secondary | ICD-10-CM | POA: Diagnosis not present

## 2021-02-16 DIAGNOSIS — G894 Chronic pain syndrome: Secondary | ICD-10-CM | POA: Diagnosis not present

## 2021-02-16 DIAGNOSIS — Z79899 Other long term (current) drug therapy: Secondary | ICD-10-CM | POA: Diagnosis not present

## 2021-02-16 DIAGNOSIS — I6781 Acute cerebrovascular insufficiency: Secondary | ICD-10-CM | POA: Diagnosis not present

## 2021-02-16 DIAGNOSIS — I69398 Other sequelae of cerebral infarction: Secondary | ICD-10-CM | POA: Diagnosis not present

## 2021-02-16 DIAGNOSIS — Z9181 History of falling: Secondary | ICD-10-CM | POA: Diagnosis not present

## 2021-02-16 DIAGNOSIS — Z8673 Personal history of transient ischemic attack (TIA), and cerebral infarction without residual deficits: Secondary | ICD-10-CM

## 2021-02-16 DIAGNOSIS — M21371 Foot drop, right foot: Secondary | ICD-10-CM | POA: Diagnosis not present

## 2021-02-16 DIAGNOSIS — Z87898 Personal history of other specified conditions: Secondary | ICD-10-CM

## 2021-02-16 DIAGNOSIS — N4340 Spermatocele of epididymis, unspecified: Secondary | ICD-10-CM | POA: Diagnosis not present

## 2021-02-16 DIAGNOSIS — M48061 Spinal stenosis, lumbar region without neurogenic claudication: Secondary | ICD-10-CM | POA: Diagnosis not present

## 2021-02-16 NOTE — Progress Notes (Signed)
02/16/2021 2:39 PM   [name] [birth year] [id number]  Referring provider: Jacky Kindle, FNP [address] [place],  [place] [address]  Chief Complaint  Patient presents with   spermatocele    HPI: 50 year old male who presents today for further evaluation of spermatocele.  He reports that he has had very large left-sided spermatocele for many years.  It is very bothersome to him but he is never sought intervention or been evaluated for this.  He feels scrotal heaviness and discomfort related to this.  His goal is to be able to get back to riding his motorcycle.  Incidentally, he recently had a significant stroke in 10/2020 for which he is on baby aspirin.  He experienced initially aphasia and now has word finding difficulties but is able to communicate.  He has significant right-sided hemiparesis, right upper extremity more than lower extremity.  He is accompanied today by his ex.  He is also very concerned today about his postoperative pain should you be allowed to proceed with spermatocelectomy.  Today, he denies having a neurologist but there is a note from Dr. Hampton Abbot in care everywhere from 11/29/2020.   PMH: Past Medical History:  Diagnosis Date   Back pain 04/22/2012   Bone spur    Bulging disc 04/22/2012   Degenerative disc disease    Osteoarthritis    Panic anxiety syndrome    Stroke [facility]) 10/2020   Taking multiple medications for chronic disease     Surgical History: Past Surgical History:  Procedure Laterality Date   CYSTECTOMY     head   HERNIA REPAIR Left 2006,2014   [facility]   TOE SURGERY Right 2007    Home Medications:  Allergies as of 02/16/2021       Reactions   Duloxetine    Other reaction(s): Other (See Comments) Increased temperature, sweating, uncontrolled shaking, aggressive thoughts   Amitriptyline Other (See Comments)   Urine retention   Buspirone Other (See Comments)   Kidney pain   Ciprofloxacin Other (See Comments)    hallucinations   Doxepin Other (See Comments)   "Didn't feel right"   Escitalopram Other (See Comments)   Agitation   Olanzapine    Penicillins Other (See Comments)   Unknown -- childhood reaction   Sertraline Other (See Comments)   Urine retention   Trazodone Other (See Comments)   abd pain, urine retention        Medication List        Accurate as of February 16, 2021 11:59 PM. If you have any questions, ask your nurse or doctor.          amLODipine 5 MG tablet Commonly known as: NORVASC Take 1 tablet (5 mg total) by mouth daily.   aspirin EC 81 MG tablet Take 81 mg by mouth daily. Swallow whole.   atorvastatin 80 MG tablet Commonly known as: LIPITOR Take 1 tablet (80 mg total) by mouth daily.   donepezil 10 MG tablet Commonly known as: ARICEPT Take 1 tablet (10 mg total) by mouth at bedtime.   gabapentin 300 MG capsule Commonly known as: NEURONTIN TAKE 1 CAPSULE BY MOUTH THREE TIMES A DAY   meloxicam 15 MG tablet Commonly known as: MOBIC Take 1 tablet (15 mg total) by mouth daily.   multivitamin tablet Take 1 tablet by mouth daily.   sildenafil 100 MG tablet Commonly known as: Viagra Take 1 tablet (100 mg total) by mouth as needed for erectile dysfunction.   tiZANidine 4 MG  tablet Commonly known as: ZANAFLEX Take 1 tablet (4 mg total) by mouth in the morning, at noon, in the evening, and at bedtime.   UNABLE TO FIND cbd   Vitamin D3 50 MCG (2000 UT) Tabs Take by mouth.   zolpidem 10 MG tablet Commonly known as: AMBIEN Take 1 tablet (10 mg total) by mouth at bedtime.        Allergies:  Allergies  Allergen Reactions   Duloxetine     Other reaction(s): Other (See Comments) Increased temperature, sweating, uncontrolled shaking, aggressive thoughts   Amitriptyline Other (See Comments)    Urine retention   Buspirone Other (See Comments)    Kidney pain   Ciprofloxacin Other (See Comments)    hallucinations   Doxepin Other (See Comments)     "Didn't feel right"   Escitalopram Other (See Comments)    Agitation   Olanzapine    Penicillins Other (See Comments)    Unknown -- childhood reaction   Sertraline Other (See Comments)    Urine retention   Trazodone Other (See Comments)    abd pain, urine retention    Family History: Family History  Problem Relation Age of Onset   Diabetes Mother    Diabetes Maternal Uncle    Diabetes Maternal Grandmother    Heart disease Maternal Grandmother     Social History:  reports that he has never smoked. He has never used smokeless tobacco. He reports that he does not currently use alcohol. He reports that he does not currently use drugs after having used the following drugs: Marijuana.   Physical Exam: BP 127/77   Pulse 76   Ht 6\' 1"  (1.854 m)   Wt 225 lb (102.1 kg)   BMI 29.69 kg/m   Constitutional:  Alert and oriented, No acute distress.  Conversive, having some word finding difficulties but able to express himself clearly.  He is accompanied by a male companion today. HEENT: [place] AT, moist mucus membranes.  Trachea midline, no masses. Cardiovascular: No clubbing, cyanosis, or edema. Respiratory: Normal respiratory effort, no increased work of breathing. GI: Abdomen is soft, nontender, nondistended, no abdominal masses GU: Large left-sided extratesticular fluid filled mass consistent with spermatocele appreciated.  Left testicle is normal.  Right testicle unremarkable. Skin: No rashes, bruises or suspicious lesions. Neurologic: As above, right-sided arm and leg weakness appreciated. Psychiatric: Normal mood and affect.  Laboratory Data: Lab Results  Component Value Date   WBC 6.0 12/14/2020   HGB 15.6 12/14/2020   HCT 47.2 12/14/2020   MCV 95 12/14/2020   PLT 322 12/14/2020    Lab Results  Component Value Date   CREATININE 0.80 12/14/2020    Pertinent Imaging: Narrative & Impression  CLINICAL DATA:  Scrotal edema   EXAM: SCROTAL ULTRASOUND   DOPPLER  ULTRASOUND OF THE TESTICLES   TECHNIQUE: Complete ultrasound examination of the testicles, epididymis, and other scrotal structures was performed. Color and spectral Doppler ultrasound were also utilized to evaluate blood flow to the testicles.   COMPARISON:  None.   FINDINGS: Right testicle   Measurements: 4.4 x 2.7 x 3.1 cm. No mass or microlithiasis visualized.   Left testicle   Measurements: 3.9 x 3 x 3.5 cm. No mass or microlithiasis visualized.   Right epididymis:  Cyst measuring 9 mm   Left epididymis: Multiple large cysts measuring up to 8.8 x 4.6 x 6.9 cm suggestive of spermatocele.   Hydrocele:  Small right hydrocele.   Varicocele:  None visualized.   Pulsed Doppler  interrogation of both testes demonstrates normal low resistance arterial and venous waveforms bilaterally.   Targeted ultrasound of left inguinal region demonstrates no definitive hernia   IMPRESSION: 1. Small right-sided hydrocele 2. 8.8 cm cyst in the region of left epididymis suggestive of spermatocele 3. Negative for intratesticular mass.     Electronically Signed   By: Jasmine Pang M.D.   On: 01/25/2021 23:09   Scrotal ultrasound was personally reviewed today.  Agree with radiologic interpretation.  Assessment & Plan:    1. Spermatocele Large chronic left-sided spermatocele  We discussed standard care for symptomatic spermatocele which is spermatocelectomy.  We discussed the preoperative intraoperative and postoperative course along with risk and benefits of the procedure.  Ideally, he would be off of aspirin for this procedure however in light of recent stroke, that this likely ill advisable.  Ideally, we would wait a period of time, perhaps 6 months from the stroke to allow him to continue to improve and ensure that he is not can have any further strokes or neurological complications.  He is adamant that he would like to pursue spermatocelectomy immediately.  He does not want a  wait.  He clearly understands the risks of having a major complication such as stroke, neurological compromise and or death.  He is willing to accept all of these risk and is able to verbalize this today.  We will plan to get cardiac clearance from his neurologist to hold aspirin.  If they do not feel that this is advised, we can consider doing the procedure on aspirin and leave a drain versus putting off the procedure until aspirin can be held more safely.  Patient was somewhat pushy about having the procedure done.  I expressed my concerns about the proximity to his most recent stroke and as well as his ongoing neurological recovery.  His affect was somewhat unusual which may be related to his recent stroke but also see #2.  2. History of chronic pain We discussed postoperative pain (conversation initiated by patient )which is for the most part fairly minimal for most patients  He had very specific ideas about which medications he would prefer to been effective in the past.  He does have a personal history of chronic pain and as such, will likely be very clear about what our postoperative pain management plan will be and will limit the number of pain medications dispensed.  3. History of stroke Will require neurological clearance, personal preference is to hold off another 3 months at minimum prior to proceeding with this procedure    Vanna Scotland, MD  [facility] [address], [address] [place], [place] [address] [phone]

## 2021-02-17 DIAGNOSIS — G8929 Other chronic pain: Secondary | ICD-10-CM | POA: Diagnosis not present

## 2021-02-17 DIAGNOSIS — M545 Low back pain, unspecified: Secondary | ICD-10-CM | POA: Diagnosis not present

## 2021-02-17 DIAGNOSIS — M549 Dorsalgia, unspecified: Secondary | ICD-10-CM | POA: Diagnosis not present

## 2021-02-19 ENCOUNTER — Telehealth: Payer: Self-pay | Admitting: *Deleted

## 2021-02-19 ENCOUNTER — Other Ambulatory Visit: Payer: Self-pay | Admitting: Urology

## 2021-02-19 DIAGNOSIS — I10 Essential (primary) hypertension: Secondary | ICD-10-CM

## 2021-02-19 DIAGNOSIS — E7800 Pure hypercholesterolemia, unspecified: Secondary | ICD-10-CM

## 2021-02-19 NOTE — Progress Notes (Signed)
Surgical Physician Order Form  * Scheduling expectation :  Prefer to wait a few months ; recently had a stroke 3 months ago  *Length of Case:   *Clearance needed: Neurology, specifically ask for recommended timeframe for performing this nonurgent elective surgery in light of recent stroke  *Anticoagulation Instructions: Hold all anticoagulation  *Aspirin Instructions:  Preference to hold baby aspirin but if deemed too risky, will do surgery on baby aspirin if needed  *Post-op visit Date/Instructions:  1 month follow up  *Diagnosis:  Left spermatocele  *Procedure:  Left spermatocelectomy  -Admit type: OUTpatient  -Anesthesia: General  -VTE Prophylaxis Standing Order SCD's       Other:   -Standing Lab Orders Per Anesthesia    Lab other: None  -Standing Test orders EKG/Chest x-ray per Anesthesia       Test other:   - Medications:     Ancef 2gm IV okay with childhood allergy   Other Instructions:

## 2021-02-19 NOTE — Telephone Encounter (Signed)
   Telephone encounter was:  Unsuccessful.  02/19/2021 Name: David Reeves MRN: [id number] DOB: February 02, 1971  Unsuccessful outbound call made today to assist with:  Transportation Needs  and Food Insecurity  Outreach Attempt:  1st Attempt  A HIPAA compliant voice message was left requesting a return call.  Instructed patient to call back at   Instructed patient to call back at [phone]  at their earliest convenience.   Alois Cliche -[facility] Guide , Embedded Care Coordination [facility], Care Management  [phone] [address] [place] , [place] [address] Email : [email or web address]

## 2021-02-20 ENCOUNTER — Ambulatory Visit: Payer: Medicare HMO

## 2021-02-20 ENCOUNTER — Telehealth: Payer: Self-pay | Admitting: *Deleted

## 2021-02-20 NOTE — Telephone Encounter (Signed)
   Telephone encounter was:  Successful.  02/20/2021 Name: Harwood Nall MRN: [id number] DOB: 1971-04-20  Durell Lofaso is a 50 y.o. year old male who is a primary care patient of Jacky Kindle, FNP . The community resource team was consulted for assistance with Transportation Needs  and Food InsecurityPatient will be provided  with a listing of food banks and also through the charity fund will send a food gift card to patient   Care guide performed the following interventions: Patient provided with information about care guide support team and interviewed to confirm resource needs Follow up call placed to community resources to determine status of patients referral.  Follow Up Plan:  No further follow up planned at this time. The patient has been provided with needed resources.  Alois Cliche -[facility] Guide , Embedded Care Coordination [facility], Care Management  [phone] [address] [place] , [place] [address] Email : [email or web address]

## 2021-02-21 ENCOUNTER — Telehealth: Payer: Self-pay | Admitting: Family Medicine

## 2021-02-21 DIAGNOSIS — M48061 Spinal stenosis, lumbar region without neurogenic claudication: Secondary | ICD-10-CM | POA: Diagnosis not present

## 2021-02-21 DIAGNOSIS — M21371 Foot drop, right foot: Secondary | ICD-10-CM | POA: Diagnosis not present

## 2021-02-21 DIAGNOSIS — Z9181 History of falling: Secondary | ICD-10-CM | POA: Diagnosis not present

## 2021-02-21 DIAGNOSIS — I6781 Acute cerebrovascular insufficiency: Secondary | ICD-10-CM | POA: Diagnosis not present

## 2021-02-21 DIAGNOSIS — G894 Chronic pain syndrome: Secondary | ICD-10-CM | POA: Diagnosis not present

## 2021-02-21 DIAGNOSIS — I69398 Other sequelae of cerebral infarction: Secondary | ICD-10-CM | POA: Diagnosis not present

## 2021-02-21 DIAGNOSIS — F5101 Primary insomnia: Secondary | ICD-10-CM | POA: Diagnosis not present

## 2021-02-21 DIAGNOSIS — Z79899 Other long term (current) drug therapy: Secondary | ICD-10-CM | POA: Diagnosis not present

## 2021-02-21 NOTE — Telephone Encounter (Signed)
Home Health Verbal Orders - Caller/Agency: Johanna// [facility] Callback Number: [phone] secure  Requesting OT/PT/Skilled Nursing/Social Work/Speech Therapy: Speech Therapy Frequency: Requesting to have a decrease in frequency. She states that the pt originally received approval for 2w1 and 1w3. She is now requesting to have 1w4. Please advise.

## 2021-02-22 ENCOUNTER — Ambulatory Visit: Payer: Medicare HMO

## 2021-02-22 ENCOUNTER — Ambulatory Visit (INDEPENDENT_AMBULATORY_CARE_PROVIDER_SITE_OTHER): Payer: Medicare HMO | Admitting: *Deleted

## 2021-02-22 ENCOUNTER — Encounter: Payer: Self-pay | Admitting: Family Medicine

## 2021-02-22 ENCOUNTER — Encounter: Payer: Medicare HMO | Admitting: Speech Pathology

## 2021-02-22 ENCOUNTER — Other Ambulatory Visit: Payer: Self-pay

## 2021-02-22 ENCOUNTER — Ambulatory Visit (INDEPENDENT_AMBULATORY_CARE_PROVIDER_SITE_OTHER): Payer: Medicare HMO | Admitting: Family Medicine

## 2021-02-22 VITALS — BP 139/88 | HR 91 | Resp 16 | Wt 220.5 lb

## 2021-02-22 DIAGNOSIS — G894 Chronic pain syndrome: Secondary | ICD-10-CM

## 2021-02-22 DIAGNOSIS — F09 Unspecified mental disorder due to known physiological condition: Secondary | ICD-10-CM

## 2021-02-22 DIAGNOSIS — I10 Essential (primary) hypertension: Secondary | ICD-10-CM

## 2021-02-22 DIAGNOSIS — M48062 Spinal stenosis, lumbar region with neurogenic claudication: Secondary | ICD-10-CM

## 2021-02-22 DIAGNOSIS — M62838 Other muscle spasm: Secondary | ICD-10-CM | POA: Insufficient documentation

## 2021-02-22 DIAGNOSIS — J0110 Acute frontal sinusitis, unspecified: Secondary | ICD-10-CM | POA: Diagnosis not present

## 2021-02-22 DIAGNOSIS — F5101 Primary insomnia: Secondary | ICD-10-CM | POA: Diagnosis not present

## 2021-02-22 MED ORDER — BACLOFEN 20 MG PO TABS: 20.0000 mg | ORAL_TABLET | Freq: Three times a day (TID) | ORAL | 3 refills | Status: DC

## 2021-02-22 MED ORDER — ZOLPIDEM TARTRATE 10 MG PO TABS: 10.0000 mg | ORAL_TABLET | Freq: Every day | ORAL | 0 refills | Status: DC

## 2021-02-22 MED ORDER — GUAIFENESIN-DM 100-10 MG/5ML PO SYRP: 5.0000 mL | ORAL_SOLUTION | ORAL | 0 refills | Status: DC | PRN

## 2021-02-22 MED ORDER — TIZANIDINE HCL 4 MG PO TABS: 4.0000 mg | ORAL_TABLET | Freq: Four times a day (QID) | ORAL | 3 refills | Status: DC

## 2021-02-22 MED ORDER — DIAZEPAM 2 MG PO TABS: 2.0000 mg | ORAL_TABLET | Freq: Four times a day (QID) | ORAL | 0 refills | Status: DC | PRN

## 2021-02-22 NOTE — Addendum Note (Signed)
Addended by: Merita Norton T on: 02/22/2021 08:50 PM   Modules accepted: Level of Service

## 2021-02-22 NOTE — Assessment & Plan Note (Signed)
Continues to await chronic pain appt

## 2021-02-22 NOTE — Assessment & Plan Note (Signed)
Improved with ambien; sleeps 3-6 hrs

## 2021-02-22 NOTE — Assessment & Plan Note (Signed)
Recent ER visit Robust conversation request valium Advised on addictive nature of the medication and cause for increased dosing and lack of effectiveness over time Patient almost belligerent with staff- advised this is not the job of the PCP, but of a CVA specialist Agreed to do low dose medication with previous Rx to get him to neuro visit

## 2021-02-22 NOTE — Chronic Care Management (AMB) (Signed)
Chronic Care Management    Clinical Social Work Note  02/22/2021 Name: David Reeves MRN: [id number] DOB: [birth year]  David Reeves is a 49 y.o. year old male who is a primary care patient of Jacky Kindle, FNP. The CCM team was consulted to assist the patient with chronic disease management and/or care coordination needs related to: [employer] .   Engaged with patient by telephone for follow up visit in response to provider referral for social work chronic care management and care coordination services.   Consent to Services:  The patient was given information about Chronic Care Management services, agreed to services, and gave verbal consent prior to initiation of services.  Please see initial visit note for detailed documentation.   Patient agreed to services and consent obtained.   Assessment: Review of patient past medical history, allergies, medications, and health status, including review of relevant consultants reports was performed today as part of a comprehensive evaluation and provision of chronic care management and care coordination services.     SDOH (Social Determinants of Health) assessments and interventions performed:    Advanced Directives Status: Not addressed in this encounter.  CCM Care Plan  Allergies  Allergen Reactions   Duloxetine     Other reaction(s): Other (See Comments) Increased temperature, sweating, uncontrolled shaking, aggressive thoughts   Amitriptyline Other (See Comments)    Urine retention   Buspirone Other (See Comments)    Kidney pain   Ciprofloxacin Other (See Comments)    hallucinations   Doxepin Other (See Comments)    "Didn't feel right"   Escitalopram Other (See Comments)    Agitation   Olanzapine    Penicillins Other (See Comments)    Unknown -- childhood reaction   Sertraline Other (See Comments)    Urine retention   Trazodone Other (See Comments)    abd pain, urine retention    Outpatient Encounter  Medications as of 02/22/2021  Medication Sig   amLODipine (NORVASC) 5 MG tablet Take 1 tablet (5 mg total) by mouth daily.   aspirin EC 81 MG tablet Take 81 mg by mouth daily. Swallow whole.   atorvastatin (LIPITOR) 80 MG tablet Take 1 tablet (80 mg total) by mouth daily.   Cholecalciferol (VITAMIN D3) 2000 units TABS Take by mouth.   donepezil (ARICEPT) 10 MG tablet Take 1 tablet (10 mg total) by mouth at bedtime.   gabapentin (NEURONTIN) 300 MG capsule TAKE 1 CAPSULE BY MOUTH THREE TIMES A DAY   meloxicam (MOBIC) 15 MG tablet Take 1 tablet (15 mg total) by mouth daily.   Multiple Vitamin (MULTIVITAMIN) tablet Take 1 tablet by mouth daily.   sildenafil (VIAGRA) 100 MG tablet Take 1 tablet (100 mg total) by mouth as needed for erectile dysfunction.   tiZANidine (ZANAFLEX) 4 MG tablet Take 1 tablet (4 mg total) by mouth in the morning, at noon, in the evening, and at bedtime.   UNABLE TO FIND cbd   zolpidem (AMBIEN) 10 MG tablet Take 1 tablet (10 mg total) by mouth at bedtime.   No facility-administered encounter medications on file as of 02/22/2021.    Patient Active Problem List   Diagnosis Date Noted   Chronic pain syndrome 01/23/2021   Primary hypertension 01/23/2021   Nerve pain 01/23/2021   Erectile dysfunction due to diseases classified elsewhere 01/23/2021   Cognitive dysfunction 01/23/2021   Scrotal edema 01/23/2021   Elevated LDL cholesterol level 01/23/2021   History of stroke with residual deficit 01/23/2021  Primary insomnia 01/23/2021   Mouth pain 01/23/2021   Laceration of right hand without foreign body    Closed compression fracture of L1 lumbar vertebra 05/24/2016   Lumbar stenosis with neurogenic claudication 11/01/2015   Chronic 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 11/08/2014   Hx of hemorrhoids 11/08/2014   AAA (abdominal aortic aneurysm) (HCC) 09/22/2014   Chronic pain associated with significant psychosocial dysfunction 09/22/2014   Panic attack  09/22/2014   AB (asthmatic bronchitis) 08/17/2014   Anxiety 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 08/17/2014   Backache 08/17/2014   Lumbosacral spondylosis without myelopathy 08/17/2014   Disorder of male genital organs 08/17/2014   Brash 08/17/2014   Low back pain 08/17/2014   Tendon nodule 08/17/2014   Episodic paroxysmal anxiety disorder 08/17/2014   Hernia, inguinal, right 08/17/2014   Fast heart beat 08/17/2014   Illness 08/17/2014   Inguinal hernia 10/14/2012    Conditions to be addressed/monitored:  Cognitive dysfunction; Limited social support and Transportation Care Plan : General Social Work (Adult)  Updates made by [employer], Clent Jacks, LCSW since 02/22/2021 12:00 AM     Problem: CHL AMB "PATIENT-SPECIFIC PROBLEM"   Priority: Medium  Note:   CARE PLAN ENTRY (see longitudinal plan of care for additional care plan information)  Current Barriers:  Patient with  cognitive dysfunction  -history of stroke-in need of assistance with connection to community resources  Knowledge deficits and need for support, education and care coordination related to community resources support  Financial constraints related to current medical condition, Limited social support, Transportation, ADL IADL limitations, Cognitive Deficits, and Lacks knowledge of community resource: Surveyor, quantity)  Over the next 90 days, patient will follow up with the Department of Social Services regarding the status of his Medicaid application as well as [employer] transportation to follow up on the coordination of transportation needs  Interventions provided by LCSW:  Assessed patient's care coordination needs related to need for transportation as well as Medicaid coverage and discussed ongoing care management follow up  Confirmed that patient has contacted [employer] and has enrolled for transportation to future medical appointments Followed up on Medicaid  application-patient has received application, it has been signed address provided to return application to the Department of Social Services for processing-patient agreeable to dropping application off Positive reinforcement provided regarding coordination of Transportation to medical appointment through [employer] and completion of medicaid application Confirmed that patient will receive an additional letter in the mail with additional documents needed-patient to contact this Child psychotherapist when received for additional assistance CCM social worker to continue to follow up on status of medicaid application   Patient Self Care Activities & Deficits:  Patient is unable to independently navigate community resource options without care coordination support  Acknowledges deficits and is motivated to resolve concern  Patient is able to contact [employer] as discussed today Unable to independently transport self to medical appointments, has difficulty with the functioning of his right side due to stroke Self administers medications as prescribed  Initial goal documentation        Follow Up Plan: SW will follow up with patient by phone over the next 14 business days       [employer], [place] Clinical Social Worker  [facility] Care Management [phone]

## 2021-02-22 NOTE — Progress Notes (Addendum)
Established patient visit   Patient: David Reeves   DOB: 1971-01-14   50 y.o. Male  MRN: [id number] Visit Date: 02/22/2021  Today's healthcare provider: Jacky Kindle, FNP   Chief Complaint  Patient presents with   Follow-up   Subjective    HPI  Follow up ER visit  Patient was seen in ER for back pain on 02/17/21. He was treated for back pain. Treatment for this included valium, toradol and lidocaine patches . He reports excellent compliance with treatment. He reports this condition is Unchanged.  -----------------------------------------------------------------------------------------   ---------------------------------------------------------------------------------------------------  Follow up for chronic pain syndrome  The patient was last seen for this 1 months ago. Changes made at last visit include:  Advised to increase mobic, add gaba, increase xanaflex.  He reports excellent compliance with treatment. He feels that condition is Unchanged. He is not having side effects.  -----------------------------------------------------------------------------------------   Addressed extensive list of chronic and acute medical problems today requiring 40 minutes reviewing his medical record, counseling patient regarding his conditions and coordination of care including sleep study, allergy testing, bace brace, etc.  Medications: Outpatient Medications Prior to Visit  Medication Sig   amLODipine (NORVASC) 5 MG tablet Take 1 tablet (5 mg total) by mouth daily.   aspirin EC 81 MG tablet Take 81 mg by mouth daily. Swallow whole.   atorvastatin (LIPITOR) 80 MG tablet Take 1 tablet (80 mg total) by mouth daily.   Cholecalciferol (VITAMIN D3) 2000 units TABS Take by mouth.   donepezil (ARICEPT) 10 MG tablet Take 1 tablet (10 mg total) by mouth at bedtime.   gabapentin (NEURONTIN) 300 MG capsule TAKE 1 CAPSULE BY MOUTH THREE TIMES A DAY   meloxicam (MOBIC) 15 MG  tablet Take 1 tablet (15 mg total) by mouth daily.   Multiple Vitamin (MULTIVITAMIN) tablet Take 1 tablet by mouth daily.   sildenafil (VIAGRA) 100 MG tablet Take 1 tablet (100 mg total) by mouth as needed for erectile dysfunction.   UNABLE TO FIND cbd   [DISCONTINUED] tiZANidine (ZANAFLEX) 4 MG tablet Take 1 tablet (4 mg total) by mouth in the morning, at noon, in the evening, and at bedtime.   [DISCONTINUED] zolpidem (AMBIEN) 10 MG tablet Take 1 tablet (10 mg total) by mouth at bedtime.   No facility-administered medications prior to visit.    Review of Systems     Objective    BP 139/88   Pulse 91   Resp 16   Wt 220 lb 8 oz (100 kg)   SpO2 100%   BMI 29.09 kg/m    Physical Exam Vitals and nursing note reviewed.  Constitutional:      Appearance: Normal appearance. He is overweight.  HENT:     Head: Normocephalic and atraumatic.  Eyes:     Pupils: Pupils are equal, round, and reactive to light.  Cardiovascular:     Rate and Rhythm: Normal rate and regular rhythm.     Pulses: Normal pulses.     Heart sounds: Normal heart sounds.  Pulmonary:     Effort: Pulmonary effort is normal.     Breath sounds: Normal breath sounds.  Musculoskeletal:        General: Signs of injury present.     Cervical back: Normal range of motion.     Comments: Residual decreased movement in RUE s/p CVA; slight edema in R hand  Skin:    General: Skin is warm and dry.     Capillary Refill:  Capillary refill takes less than 2 seconds.  Neurological:     General: No focal deficit present.     Mental Status: He is alert and oriented to person, place, and time. Mental status is at baseline.  Psychiatric:        Mood and Affect: Mood is anxious. Affect is labile and angry.        Speech: Speech is rapid and pressured and slurred.        Behavior: Behavior is agitated and aggressive.        Thought Content: Thought content normal.        Cognition and Memory: Cognition normal.     No results  found for any visits on 02/22/21.  Assessment & Plan     Problem List Items Addressed This Visit       Respiratory   Acute non-recurrent frontal sinusitis    Trial of mucinex Encourage use of daily PO medication and flonase Patient discouraged by nasal congestion given difficulty in blowing his nose with one hand s/p CVA      Relevant Medications   guaiFENesin-dextromethorphan (ROBITUSSIN DM) 100-10 MG/5ML syrup   Other Relevant Orders   Ambulatory referral to Dermatology     Other   Chronic pain syndrome    Continues to await chronic pain appt      Relevant Medications   tiZANidine (ZANAFLEX) 4 MG tablet   baclofen (LIORESAL) 20 MG tablet   Primary insomnia    Improved with ambien; sleeps 3-6 hrs      Relevant Medications   zolpidem (AMBIEN) 10 MG tablet   Other Relevant Orders   Ambulatory referral to Sleep Studies   Muscle spasm - Primary    Recent ER visit [facility] conversation request valium Advised on addictive nature of the medication and cause for increased dosing and lack of effectiveness over time Patient almost belligerent with staff- advised this is not the job of the PCP, but of a CVA specialist Agreed to do low dose medication with previous Rx to get him to neuro visit       Relevant Medications   baclofen (LIORESAL) 20 MG tablet   Other Relevant Orders   Ambulatory referral to Neurology     Return in about 3 months (around 05/25/2021) for chonic disease management.      Leilani Merl, FNP, have reviewed all documentation for this visit. The documentation on 02/22/21 for the exam, diagnosis, procedures, and orders are all accurate and complete.    Jacky Kindle, FNP  [facility] [phone] (phone) [phone] (fax)  [facility]

## 2021-02-22 NOTE — Patient Instructions (Signed)
Visit Information  PATIENT GOALS/PLAN OF CARE:  Care Plan : General Social Work (Adult)  Updates made by [employer], Clent Jacks, LCSW since 02/22/2021 12:00 AM     Problem: CHL AMB "PATIENT-SPECIFIC PROBLEM"   Priority: Medium  Note:   CARE PLAN ENTRY (see longitudinal plan of care for additional care plan information)  Current Barriers:  Patient with  cognitive dysfunction  -history of stroke-in need of assistance with connection to community resources  Knowledge deficits and need for support, education and care coordination related to community resources support  Financial constraints related to current medical condition, Limited social support, Transportation, ADL IADL limitations, Cognitive Deficits, and Lacks knowledge of community resource: Surveyor, quantity)  Over the next 90 days, patient will follow up with the Department of Social Services regarding the status of his Medicaid application as well as [employer] transportation to follow up on the coordination of transportation needs  Interventions provided by LCSW:  Assessed patient's care coordination needs related to need for transportation as well as Medicaid coverage and discussed ongoing care management follow up  Confirmed that patient has contacted [employer] and has enrolled for transportation to future medical appointments Followed up on Medicaid application-patient has received application, it has been signed address provided to return application to the Department of Social Services for processing-patient agreeable to dropping application off Positive reinforcement provided regarding coordination of Transportation to medical appointment through [employer] and completion of medicaid application Confirmed that patient will receive an additional letter in the mail with additional documents needed-patient to contact this Child psychotherapist when received for additional assistance CCM social worker to  continue to follow up on status of medicaid application   Patient Self Care Activities & Deficits:  Patient is unable to independently navigate community resource options without care coordination support  Acknowledges deficits and is motivated to resolve concern  Patient is able to contact [employer] as discussed today Unable to independently transport self to medical appointments, has difficulty with the functioning of his right side due to stroke Self administers medications as prescribed  Initial goal documentation       The patient verbalized understanding of instructions, educational materials, and care plan provided today and declined offer to receive copy of patient instructions, educational materials, and care plan.   Telephone follow up appointment with care management team member scheduled for: 03/08/21   Verna Czech, LCSW Clinical Social Worker  [facility] [phone]

## 2021-02-22 NOTE — Assessment & Plan Note (Signed)
Trial of mucinex Encourage use of daily PO medication and flonase Patient discouraged by nasal congestion given difficulty in blowing his nose with one hand s/p CVA

## 2021-02-23 DIAGNOSIS — G894 Chronic pain syndrome: Secondary | ICD-10-CM | POA: Diagnosis not present

## 2021-02-23 DIAGNOSIS — I6781 Acute cerebrovascular insufficiency: Secondary | ICD-10-CM | POA: Diagnosis not present

## 2021-02-23 DIAGNOSIS — F5101 Primary insomnia: Secondary | ICD-10-CM | POA: Diagnosis not present

## 2021-02-23 DIAGNOSIS — Z9181 History of falling: Secondary | ICD-10-CM | POA: Diagnosis not present

## 2021-02-23 DIAGNOSIS — M21371 Foot drop, right foot: Secondary | ICD-10-CM | POA: Diagnosis not present

## 2021-02-23 DIAGNOSIS — M48061 Spinal stenosis, lumbar region without neurogenic claudication: Secondary | ICD-10-CM | POA: Diagnosis not present

## 2021-02-23 DIAGNOSIS — Z79899 Other long term (current) drug therapy: Secondary | ICD-10-CM | POA: Diagnosis not present

## 2021-02-23 DIAGNOSIS — I69398 Other sequelae of cerebral infarction: Secondary | ICD-10-CM | POA: Diagnosis not present

## 2021-02-23 NOTE — Addendum Note (Signed)
Addended by: Fonda Kinder on: 02/23/2021 08:39 AM   Modules accepted: Orders

## 2021-02-23 NOTE — Telephone Encounter (Signed)
[id number] 

## 2021-02-23 NOTE — Telephone Encounter (Signed)
Caller/Agency: Johanna// [facility] is caling back to speak to [facility] Number: [phone] secure

## 2021-02-25 DIAGNOSIS — R918 Other nonspecific abnormal finding of lung field: Secondary | ICD-10-CM | POA: Diagnosis not present

## 2021-02-25 DIAGNOSIS — M48061 Spinal stenosis, lumbar region without neurogenic claudication: Secondary | ICD-10-CM | POA: Diagnosis not present

## 2021-02-25 DIAGNOSIS — R29810 Facial weakness: Secondary | ICD-10-CM | POA: Diagnosis not present

## 2021-02-25 DIAGNOSIS — R531 Weakness: Secondary | ICD-10-CM | POA: Diagnosis not present

## 2021-02-25 DIAGNOSIS — I69398 Other sequelae of cerebral infarction: Secondary | ICD-10-CM | POA: Diagnosis not present

## 2021-02-25 DIAGNOSIS — M5124 Other intervertebral disc displacement, thoracic region: Secondary | ICD-10-CM | POA: Diagnosis not present

## 2021-02-25 DIAGNOSIS — R29818 Other symptoms and signs involving the nervous system: Secondary | ICD-10-CM | POA: Diagnosis not present

## 2021-02-25 DIAGNOSIS — J9811 Atelectasis: Secondary | ICD-10-CM | POA: Diagnosis not present

## 2021-02-25 DIAGNOSIS — G8929 Other chronic pain: Secondary | ICD-10-CM | POA: Diagnosis not present

## 2021-02-25 DIAGNOSIS — S3992XA Unspecified injury of lower back, initial encounter: Secondary | ICD-10-CM | POA: Diagnosis not present

## 2021-02-25 DIAGNOSIS — M47815 Spondylosis without myelopathy or radiculopathy, thoracolumbar region: Secondary | ICD-10-CM | POA: Diagnosis not present

## 2021-02-25 DIAGNOSIS — I639 Cerebral infarction, unspecified: Secondary | ICD-10-CM | POA: Diagnosis not present

## 2021-02-25 DIAGNOSIS — R079 Chest pain, unspecified: Secondary | ICD-10-CM | POA: Diagnosis not present

## 2021-02-25 DIAGNOSIS — Z8673 Personal history of transient ischemic attack (TIA), and cerebral infarction without residual deficits: Secondary | ICD-10-CM | POA: Diagnosis not present

## 2021-02-25 DIAGNOSIS — M546 Pain in thoracic spine: Secondary | ICD-10-CM | POA: Diagnosis not present

## 2021-02-25 DIAGNOSIS — F419 Anxiety disorder, unspecified: Secondary | ICD-10-CM | POA: Diagnosis not present

## 2021-02-25 DIAGNOSIS — F5101 Primary insomnia: Secondary | ICD-10-CM | POA: Diagnosis not present

## 2021-02-25 DIAGNOSIS — M545 Low back pain, unspecified: Secondary | ICD-10-CM | POA: Diagnosis not present

## 2021-02-25 DIAGNOSIS — M48062 Spinal stenosis, lumbar region with neurogenic claudication: Secondary | ICD-10-CM | POA: Diagnosis not present

## 2021-02-26 ENCOUNTER — Telehealth: Payer: Self-pay

## 2021-02-26 ENCOUNTER — Encounter: Payer: Self-pay | Admitting: Family Medicine

## 2021-02-26 DIAGNOSIS — G894 Chronic pain syndrome: Secondary | ICD-10-CM | POA: Diagnosis not present

## 2021-02-26 DIAGNOSIS — M21371 Foot drop, right foot: Secondary | ICD-10-CM | POA: Diagnosis not present

## 2021-02-26 DIAGNOSIS — Z79899 Other long term (current) drug therapy: Secondary | ICD-10-CM | POA: Diagnosis not present

## 2021-02-26 DIAGNOSIS — I69398 Other sequelae of cerebral infarction: Secondary | ICD-10-CM | POA: Diagnosis not present

## 2021-02-26 DIAGNOSIS — F5101 Primary insomnia: Secondary | ICD-10-CM | POA: Diagnosis not present

## 2021-02-26 DIAGNOSIS — Z9181 History of falling: Secondary | ICD-10-CM | POA: Diagnosis not present

## 2021-02-26 DIAGNOSIS — I6781 Acute cerebrovascular insufficiency: Secondary | ICD-10-CM | POA: Diagnosis not present

## 2021-02-26 DIAGNOSIS — M48061 Spinal stenosis, lumbar region without neurogenic claudication: Secondary | ICD-10-CM | POA: Diagnosis not present

## 2021-02-26 NOTE — Telephone Encounter (Signed)
Copied from CRM [id number]. Topic: General - Other >> Feb 26, 2021  8:19 AM Jaquita Rector A wrote: Reason for CRM: Patient called in to inform Merita Norton that he had to go to the ER for the same reason that he was seen by her. Say that he was having muscle spasms and thought he was having a stroke say that he spent 5 hrs in the ER. Was given medication and told he need to see the Spine specialist,  Asking for a call back to schedule an appointment immediately can be reached at Ph#  [phone]

## 2021-02-26 NOTE — Telephone Encounter (Signed)
Maralyn Sago, please do service recovery. It sounds like this therapeutic relationship is broken. He can see Lillia Abed if he likes or may find another clinic if that is better suited to him.

## 2021-02-27 ENCOUNTER — Ambulatory Visit: Payer: Medicare HMO

## 2021-02-27 ENCOUNTER — Encounter: Payer: Medicare HMO | Admitting: Speech Pathology

## 2021-02-27 NOTE — Telephone Encounter (Signed)
Pt called stating that the spine center is able to see him on 05/17/21. He is requesting to have PCP prescribe Valium 3x a day at 5mg  to him until the visit. He states that he has had to go to the ED twice due to this medication not being filled. He is requesting to have a call back to discuss this and next steps. Please advise.      [phone]

## 2021-02-28 ENCOUNTER — Telehealth: Payer: Self-pay

## 2021-02-28 ENCOUNTER — Other Ambulatory Visit: Payer: Self-pay | Admitting: Physician Assistant

## 2021-02-28 ENCOUNTER — Telehealth: Payer: Self-pay | Admitting: Family Medicine

## 2021-02-28 DIAGNOSIS — Z9181 History of falling: Secondary | ICD-10-CM | POA: Diagnosis not present

## 2021-02-28 DIAGNOSIS — M48061 Spinal stenosis, lumbar region without neurogenic claudication: Secondary | ICD-10-CM | POA: Diagnosis not present

## 2021-02-28 DIAGNOSIS — G8929 Other chronic pain: Secondary | ICD-10-CM

## 2021-02-28 DIAGNOSIS — M21371 Foot drop, right foot: Secondary | ICD-10-CM | POA: Diagnosis not present

## 2021-02-28 DIAGNOSIS — I69398 Other sequelae of cerebral infarction: Secondary | ICD-10-CM | POA: Diagnosis not present

## 2021-02-28 DIAGNOSIS — Z79899 Other long term (current) drug therapy: Secondary | ICD-10-CM | POA: Diagnosis not present

## 2021-02-28 DIAGNOSIS — F410 Panic disorder [episodic paroxysmal anxiety] without agoraphobia: Secondary | ICD-10-CM

## 2021-02-28 DIAGNOSIS — G894 Chronic pain syndrome: Secondary | ICD-10-CM

## 2021-02-28 DIAGNOSIS — M47817 Spondylosis without myelopathy or radiculopathy, lumbosacral region: Secondary | ICD-10-CM

## 2021-02-28 DIAGNOSIS — I6781 Acute cerebrovascular insufficiency: Secondary | ICD-10-CM | POA: Diagnosis not present

## 2021-02-28 DIAGNOSIS — F5101 Primary insomnia: Secondary | ICD-10-CM | POA: Diagnosis not present

## 2021-02-28 NOTE — Telephone Encounter (Signed)
Copied from CRM [id number]. Topic: General - Other >> Feb 28, 2021  8:31 AM Jaquita Rector A wrote: Reason for CRM: Patient called in to inform Merita Norton that he was having nocturnal paralysis during his sleep say that this was caused due to the change of his medication and that he will need all referrals that were requested recently. [phone]

## 2021-02-28 NOTE — Telephone Encounter (Signed)
I will place referral today, he did not mention this at office visit nor did he report to me episodes of panic attacks, he reported concerns of sleep apnea and chronic pain.

## 2021-02-28 NOTE — Telephone Encounter (Signed)
Copied from CRM [id number]. Topic: Referral - Request for Referral >> Feb 28, 2021  8:27 AM Jaquita Rector A wrote: Has patient seen PCP for this complaint? No. *If NO, is insurance requiring patient see PCP for this issue before PCP can refer them? Referral for which specialty: Psychiatrist Preferred provider/office: none chosen Reason for referral: Having panic attack at night 02/27/21 while sleeping and was having nocturnal paralysis during this time.

## 2021-02-28 NOTE — Telephone Encounter (Signed)
Can we see if we can get him into pain management before this

## 2021-03-01 ENCOUNTER — Telehealth: Payer: Self-pay

## 2021-03-01 ENCOUNTER — Encounter: Payer: Medicare HMO | Admitting: Speech Pathology

## 2021-03-01 ENCOUNTER — Ambulatory Visit: Payer: Medicare HMO

## 2021-03-01 NOTE — Telephone Encounter (Signed)
Please review. KW 

## 2021-03-01 NOTE — Telephone Encounter (Signed)
Copied from CRM [id number]. Topic: General - Inquiry >> Mar 01, 2021  8:34 AM Elliot Gault wrote: David Reeves name: David Reeves  Relation to pt: Speech Therapist [facility]  Call back number: [phone]    Reason for call:  Requesting Dx history for a communication device specifically the onset date for his cerebral infarction

## 2021-03-02 ENCOUNTER — Telehealth: Payer: Self-pay | Admitting: Family Medicine

## 2021-03-02 ENCOUNTER — Ambulatory Visit: Payer: Medicare HMO

## 2021-03-02 DIAGNOSIS — I10 Essential (primary) hypertension: Secondary | ICD-10-CM | POA: Diagnosis not present

## 2021-03-02 DIAGNOSIS — F410 Panic disorder [episodic paroxysmal anxiety] without agoraphobia: Secondary | ICD-10-CM | POA: Diagnosis not present

## 2021-03-02 DIAGNOSIS — I6930 Unspecified sequelae of cerebral infarction: Secondary | ICD-10-CM

## 2021-03-02 DIAGNOSIS — Z7982 Long term (current) use of aspirin: Secondary | ICD-10-CM | POA: Diagnosis not present

## 2021-03-02 DIAGNOSIS — M545 Low back pain, unspecified: Secondary | ICD-10-CM | POA: Diagnosis not present

## 2021-03-02 DIAGNOSIS — M62838 Other muscle spasm: Secondary | ICD-10-CM | POA: Diagnosis not present

## 2021-03-02 DIAGNOSIS — G894 Chronic pain syndrome: Secondary | ICD-10-CM | POA: Diagnosis not present

## 2021-03-02 DIAGNOSIS — I69351 Hemiplegia and hemiparesis following cerebral infarction affecting right dominant side: Secondary | ICD-10-CM | POA: Diagnosis not present

## 2021-03-02 DIAGNOSIS — M549 Dorsalgia, unspecified: Secondary | ICD-10-CM | POA: Diagnosis not present

## 2021-03-02 DIAGNOSIS — Z880 Allergy status to penicillin: Secondary | ICD-10-CM | POA: Diagnosis not present

## 2021-03-02 DIAGNOSIS — I69320 Aphasia following cerebral infarction: Secondary | ICD-10-CM | POA: Diagnosis not present

## 2021-03-02 NOTE — Chronic Care Management (AMB) (Signed)
Chronic Care Management   CCM RN Visit Note  03/02/2021 Name: David Reeves MRN: [id number] DOB: 1970/06/26  Subjective: David Reeves is a 50 y.o. year old male who is a primary care patient of Gwyneth Sprout, FNP. The care management team was consulted for assistance with disease management and care coordination needs.    Engaged with patient by telephone for follow up visit in response to provider referral for case management and care coordination services.   Consent to Services:  The patient was given information about Chronic Care Management services, agreed to services, and gave verbal consent prior to initiation of services.  Please see initial visit note for detailed documentation.    Assessment: Review of patient past medical history, allergies, medications, health status, including review of consultants reports, laboratory and other test data, was performed as part of comprehensive evaluation and provision of chronic care management services.   SDOH (Social Determinants of Health) assessments and interventions performed: No   CCM Care Plan  Allergies  Allergen Reactions   Duloxetine     Other reaction(s): Other (See Comments) Increased temperature, sweating, uncontrolled shaking, aggressive thoughts   Amitriptyline Other (See Comments)    Urine retention   Buspirone Other (See Comments)    Kidney pain   Ciprofloxacin Other (See Comments)    hallucinations   Doxepin Other (See Comments)    "Didn't feel right"   Escitalopram Other (See Comments)    Agitation   Olanzapine    Penicillins Other (See Comments)    Unknown -- childhood reaction   Sertraline Other (See Comments)    Urine retention   Trazodone Other (See Comments)    abd pain, urine retention    Outpatient Encounter Medications as of 03/02/2021  Medication Sig   amLODipine (NORVASC) 5 MG tablet Take 1 tablet (5 mg total) by mouth daily.   aspirin EC 81 MG tablet Take 81 mg by mouth  daily. Swallow whole.   atorvastatin (LIPITOR) 80 MG tablet Take 1 tablet (80 mg total) by mouth daily.   baclofen (LIORESAL) 20 MG tablet Take 1 tablet (20 mg total) by mouth 3 (three) times daily.   Cholecalciferol (VITAMIN D3) 2000 units TABS Take by mouth.   diazepam (VALIUM) 2 MG tablet Take 1 tablet (2 mg total) by mouth every 6 (six) hours as needed for muscle spasms.   donepezil (ARICEPT) 10 MG tablet Take 1 tablet (10 mg total) by mouth at bedtime.   gabapentin (NEURONTIN) 300 MG capsule TAKE 1 CAPSULE BY MOUTH THREE TIMES A DAY   guaiFENesin-dextromethorphan (ROBITUSSIN DM) 100-10 MG/5ML syrup Take 5 mLs by mouth every 4 (four) hours as needed for cough.   meloxicam (MOBIC) 15 MG tablet Take 1 tablet (15 mg total) by mouth daily.   Multiple Vitamin (MULTIVITAMIN) tablet Take 1 tablet by mouth daily.   sildenafil (VIAGRA) 100 MG tablet Take 1 tablet (100 mg total) by mouth as needed for erectile dysfunction.   tiZANidine (ZANAFLEX) 4 MG tablet Take 1 tablet (4 mg total) by mouth in the morning, at noon, in the evening, and at bedtime.   UNABLE TO FIND cbd   zolpidem (AMBIEN) 10 MG tablet Take 1 tablet (10 mg total) by mouth at bedtime.   No facility-administered encounter medications on file as of 03/02/2021.    Patient Active Problem List   Diagnosis Date Noted   Muscle spasm 02/22/2021   Acute non-recurrent frontal sinusitis 02/22/2021   Chronic pain syndrome 01/23/2021  Primary hypertension 01/23/2021   Nerve pain 01/23/2021   Erectile dysfunction due to diseases classified elsewhere 01/23/2021   Cognitive dysfunction 01/23/2021   Scrotal edema 01/23/2021   Elevated LDL cholesterol level 01/23/2021   History of stroke with residual deficit 01/23/2021   Primary insomnia 01/23/2021   Mouth pain 01/23/2021   Laceration of right hand without foreign body    Closed compression fracture of L1 lumbar vertebra 05/24/2016   Lumbar stenosis with neurogenic claudication 11/01/2015    Chronic 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 11/08/2014   Hx of hemorrhoids 11/08/2014   AAA (abdominal aortic aneurysm) ([place]) 09/22/2014   Chronic pain associated with significant psychosocial dysfunction 09/22/2014   Panic attack 09/22/2014   AB (asthmatic bronchitis) 08/17/2014   Anxiety 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 08/17/2014   Backache 08/17/2014   Lumbosacral spondylosis without myelopathy 08/17/2014   Disorder of male genital organs 08/17/2014   Brash 08/17/2014   Low back pain 08/17/2014   Tendon nodule 08/17/2014   Episodic paroxysmal anxiety disorder 08/17/2014   Hernia, inguinal, right 08/17/2014   Fast heart beat 08/17/2014   Illness 08/17/2014   Inguinal hernia 10/14/2012        Patient Care Plan: RN Care Manager Plan of Care     Problem Identified: Hypertension and Hyperlipidemia      Long-Range Goal: Disease Progression Prevented or Minimized   Start Date: 02/12/2021  Expected End Date: 05/13/2021  Priority: High  Note:   Current Barriers:  Chronic Disease Management support and education needs related to HTN and HLD   RNCM Clinical Goal(s):  Patient will adhere to treatment plan for management of HTN and HLD Patient will work with the [employer] to discuss options for transportation and nutrition assistance.   Interventions: 1:1 collaboration with primary care provider regarding development and update of comprehensive plan of care as evidenced by provider attestation and co-signature Inter-disciplinary care team collaboration (see longitudinal plan of care) Evaluation of current treatment plan related to  self management and patient's adherence to plan as established by provider  Hypertension Interventions: Last practice recorded BP readings:  BP Readings from Last 3 Encounters:  02/16/21 127/77  01/28/21 (!) 136/91  01/23/21 117/77  Most recent eGFR/CrCl:  Lab Results  Component Value Date   EGFR 108  12/14/2020    No components found for: CRCL    Evaluation of current treatment plan related to hypertension self management and patient's adherence to plan as established by provider; Provided education to patient re: stroke prevention, s/s of heart attack and stroke; Reviewed medications with and discussed importance of compliance. Advised to update the care management team with concerns regarding prescription cost. Discussed established BP parameters and indications for notifying a provider. Advised to monitor BP daily and record readings. Reports his BP is currently being monitored by the Home Health staff. Reports readings have been within range. Provided education regarding heart health/cardiac prudent food options. Advised to more read nutrition labels, monitor sodium intake and avoid highly processed foods when possible.  Discussed importance of engaging in physical activity. His activity is currently limited chronic pain. He is currently receiving Home Health Physical Therapy.  Discussed complications of poorly controlled blood pressure such as heart disease, stroke, circulatory complications, vision complications and kidney impairment.   Hyperlipidemia Interventions:  Lab Results  Component Value Date   CHOL 246 (H) 12/14/2020   HDL 43 12/14/2020   LDLCALC 179 (H) 12/14/2020   TRIG 129 12/14/2020  CHOLHDL 5.7 (H) 12/14/2020    Discussed importance of taking medications as prescribed. Advised to update provider if unable to tolerate prescribed regimen. Reviewed provider established cholesterol goals reviewed. Counseled on importance of regular laboratory monitoring as prescribed. Reviewed importance of limiting foods high in cholesterol.   Patient Goals/Self-Care Activities: Patient will self administer medications as prescribed Patient will attend all scheduled provider appointments Patient will complete labs as advised Patient will call pharmacy for medication  refills Patient will continue to perform ADL's independently       PLAN A member of the care management team will follow up within the next week.   [facility] Care Management [facility] [phone]

## 2021-03-02 NOTE — Telephone Encounter (Signed)
Please review and advise. KW 

## 2021-03-02 NOTE — Patient Instructions (Addendum)
Thank you for allowing the Chronic Care Management team to participate in your care.  

## 2021-03-02 NOTE — Telephone Encounter (Signed)
Pt is calling because he has an appt on Tuesday for an hospital follow up. Pt reports that the gabapentin (NEURONTIN) 300 MG capsule [[id number]] is not helping his pain. Is there something else that could be sent in for the pt until his appt on Tuesday?  Preferred Pharmacy- [facility] [place], [place]  [place]- [phone]

## 2021-03-02 NOTE — Telephone Encounter (Signed)
Pt called and is requesting to have PCP send over his prescription for the pts Baclofen and  Tizanidine sent to [facility]. Pt requesting to have these sent in as 90 day supply. Please advise.          [facility] - [place], [place] - [address]  [address] [place] [address]  Phone: [phone] Fax: [phone]  Hours: Not open 24 hours

## 2021-03-06 ENCOUNTER — Ambulatory Visit: Payer: Medicare HMO

## 2021-03-06 ENCOUNTER — Other Ambulatory Visit: Payer: Self-pay

## 2021-03-06 ENCOUNTER — Ambulatory Visit
Admission: RE | Admit: 2021-03-06 | Discharge: 2021-03-06 | Disposition: A | Discharge status: Home / Self Care | Payer: Medicare HMO | Source: Ambulatory Visit | Attending: Family Medicine | Admitting: Family Medicine

## 2021-03-06 ENCOUNTER — Encounter: Payer: Medicare HMO | Admitting: Speech Pathology

## 2021-03-06 DIAGNOSIS — M48061 Spinal stenosis, lumbar region without neurogenic claudication: Secondary | ICD-10-CM | POA: Diagnosis not present

## 2021-03-06 DIAGNOSIS — G894 Chronic pain syndrome: Secondary | ICD-10-CM | POA: Diagnosis not present

## 2021-03-06 DIAGNOSIS — Z79899 Other long term (current) drug therapy: Secondary | ICD-10-CM | POA: Diagnosis not present

## 2021-03-06 DIAGNOSIS — Z9181 History of falling: Secondary | ICD-10-CM | POA: Diagnosis not present

## 2021-03-06 DIAGNOSIS — I69398 Other sequelae of cerebral infarction: Secondary | ICD-10-CM | POA: Diagnosis not present

## 2021-03-06 DIAGNOSIS — M48062 Spinal stenosis, lumbar region with neurogenic claudication: Secondary | ICD-10-CM | POA: Diagnosis not present

## 2021-03-06 DIAGNOSIS — M545 Low back pain, unspecified: Secondary | ICD-10-CM | POA: Diagnosis not present

## 2021-03-06 DIAGNOSIS — I6781 Acute cerebrovascular insufficiency: Secondary | ICD-10-CM | POA: Diagnosis not present

## 2021-03-06 DIAGNOSIS — M21371 Foot drop, right foot: Secondary | ICD-10-CM | POA: Diagnosis not present

## 2021-03-06 DIAGNOSIS — F5101 Primary insomnia: Secondary | ICD-10-CM | POA: Diagnosis not present

## 2021-03-06 NOTE — Telephone Encounter (Signed)
David Reeves with [facility] needs a dx code for this issue.  Cerebral Infarction

## 2021-03-07 ENCOUNTER — Ambulatory Visit (INDEPENDENT_AMBULATORY_CARE_PROVIDER_SITE_OTHER): Payer: Medicare HMO | Admitting: Physician Assistant

## 2021-03-07 ENCOUNTER — Encounter: Payer: Self-pay | Admitting: Physician Assistant

## 2021-03-07 VITALS — BP 139/96 | HR 79 | Ht 73.0 in | Wt 226.9 lb

## 2021-03-07 DIAGNOSIS — F410 Panic disorder [episodic paroxysmal anxiety] without agoraphobia: Secondary | ICD-10-CM | POA: Diagnosis not present

## 2021-03-07 DIAGNOSIS — G8929 Other chronic pain: Secondary | ICD-10-CM

## 2021-03-07 DIAGNOSIS — R209 Unspecified disturbances of skin sensation: Secondary | ICD-10-CM | POA: Diagnosis not present

## 2021-03-07 DIAGNOSIS — I6930 Unspecified sequelae of cerebral infarction: Secondary | ICD-10-CM

## 2021-03-07 DIAGNOSIS — I69320 Aphasia following cerebral infarction: Secondary | ICD-10-CM | POA: Diagnosis not present

## 2021-03-07 DIAGNOSIS — M47817 Spondylosis without myelopathy or radiculopathy, lumbosacral region: Secondary | ICD-10-CM | POA: Diagnosis not present

## 2021-03-07 DIAGNOSIS — S32010S Wedge compression fracture of first lumbar vertebra, sequela: Secondary | ICD-10-CM

## 2021-03-07 DIAGNOSIS — I69398 Other sequelae of cerebral infarction: Secondary | ICD-10-CM

## 2021-03-07 DIAGNOSIS — M5441 Lumbago with sciatica, right side: Secondary | ICD-10-CM

## 2021-03-07 MED ORDER — DIAZEPAM 5 MG PO TABS: 5.0000 mg | ORAL_TABLET | Freq: Two times a day (BID) | ORAL | 0 refills | Status: DC | PRN
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
## 2021-03-08 ENCOUNTER — Ambulatory Visit: Payer: Medicare HMO

## 2021-03-08 ENCOUNTER — Ambulatory Visit: Payer: Medicare HMO | Admitting: *Deleted

## 2021-03-08 ENCOUNTER — Encounter: Payer: Medicare HMO | Admitting: Speech Pathology

## 2021-03-08 ENCOUNTER — Other Ambulatory Visit: Payer: Self-pay | Admitting: Physician Assistant

## 2021-03-08 DIAGNOSIS — M48061 Spinal stenosis, lumbar region without neurogenic claudication: Secondary | ICD-10-CM | POA: Diagnosis not present

## 2021-03-08 DIAGNOSIS — F5101 Primary insomnia: Secondary | ICD-10-CM | POA: Diagnosis not present

## 2021-03-08 DIAGNOSIS — I69398 Other sequelae of cerebral infarction: Secondary | ICD-10-CM | POA: Diagnosis not present

## 2021-03-08 DIAGNOSIS — I69320 Aphasia following cerebral infarction: Secondary | ICD-10-CM | POA: Insufficient documentation

## 2021-03-08 DIAGNOSIS — I6781 Acute cerebrovascular insufficiency: Secondary | ICD-10-CM | POA: Diagnosis not present

## 2021-03-08 DIAGNOSIS — G894 Chronic pain syndrome: Secondary | ICD-10-CM

## 2021-03-08 DIAGNOSIS — I6930 Unspecified sequelae of cerebral infarction: Secondary | ICD-10-CM

## 2021-03-08 DIAGNOSIS — Z79899 Other long term (current) drug therapy: Secondary | ICD-10-CM | POA: Diagnosis not present

## 2021-03-08 DIAGNOSIS — S32010S Wedge compression fracture of first lumbar vertebra, sequela: Secondary | ICD-10-CM

## 2021-03-08 DIAGNOSIS — F410 Panic disorder [episodic paroxysmal anxiety] without agoraphobia: Secondary | ICD-10-CM

## 2021-03-08 DIAGNOSIS — I10 Essential (primary) hypertension: Secondary | ICD-10-CM

## 2021-03-08 DIAGNOSIS — G8929 Other chronic pain: Secondary | ICD-10-CM

## 2021-03-08 DIAGNOSIS — Z9181 History of falling: Secondary | ICD-10-CM | POA: Diagnosis not present

## 2021-03-08 DIAGNOSIS — M21371 Foot drop, right foot: Secondary | ICD-10-CM | POA: Diagnosis not present

## 2021-03-08 DIAGNOSIS — R209 Unspecified disturbances of skin sensation: Secondary | ICD-10-CM

## 2021-03-08 NOTE — Patient Instructions (Signed)
Visit Information  PATIENT GOALS/PLAN OF CARE:  Care Plan : General Social Work (Adult)  Updates made by [employer], Clent Jacks, LCSW since 03/08/2021 12:00 AM     Problem: CHL AMB "PATIENT-SPECIFIC PROBLEM"   Priority: Medium  Note:   CARE PLAN ENTRY (see longitudinal plan of care for additional care plan information)  Current Barriers:  Patient with  cognitive dysfunction  -history of stroke-in need of assistance with connection to community resources  Knowledge deficits and need for support, education and care coordination related to community resources support  Financial constraints related to current medical condition, Limited social support, Transportation, ADL IADL limitations, Cognitive Deficits, and Lacks knowledge of community resource: Transportation resources  Clinical Goal(s)  Over the next 90 days, patient will follow up with the Department of Social Services regarding the status of his Medicaid application as well as [employer] transportation to follow up on the coordination of transportation needs  Interventions provided by LCSW:  Assessed patient's care coordination needs related to need for transportation as well as Medicaid coverage and discussed ongoing care management follow up  Patient confirmed 3 emergency room visits due to back pain and nocturnal panic attacks-per patient he now has a follow up with a psychiatrist on 05/17/21 Confirmed that patient has contacted [employer] and has enrolled for transportation to future medical appointments-transportation resource working out well Followed up on [employer] application-patient has signed application and returned it to the Department of Engineer, site for processing  Positive reinforcement provided regarding coordination of Transportation to medical appointment through [employer], follow up with psychiatrist and completion of medicaid application Confirmed that patient will receive an additional letter in the  mail with additional documents needed-patient to contact this Child psychotherapist when received for additional assistance CCM social worker to continue to follow up on status of medicaid application   Patient Self Care Activities & Deficits:  Patient is unable to independently navigate community resource options without care coordination support  Acknowledges deficits and is motivated to resolve concern  Patient is able to contact [employer] as discussed today Unable to independently transport self to medical appointments, has difficulty with the functioning of his right side due to stroke Self administers medications as prescribed  Please see past updates related to this goal by clicking on the "Past Updates" button in the selected goal        The patient verbalized understanding of instructions, educational materials, and care plan provided today and declined offer to receive copy of patient instructions, educational materials, and care plan.   Telephone follow up appointment with care management team member scheduled for: 03/22/21   Verna Czech, LCSW Clinical Social Worker  [facility] Care Management [phone]

## 2021-03-08 NOTE — Assessment & Plan Note (Signed)
Previously was taking Klonopin which did relieve his symptoms.  We will not restart Klonopin at this time at this time.  He was started on Donezepil post CVA 5/22. I explained the side effects of nightmares this medication can cause, I advised he d/c this medication to see if his panic attacks improve.

## 2021-03-08 NOTE — Assessment & Plan Note (Addendum)
Chronic right-sided thoracic, lumbar back pain with significant muscle spasm to the right thoracic spine, right leg and right upper extremity post CVA 5/22.  This accompanied with chronic degenerative spinal changes, L1 compression fracture, alterations in sensation post CVA has led to unmanaged chronic pain in the patient.  We had a lengthy discussion about appropriate safe pain relief.  Patient would prefer 5 mg of Valium 3-4 times a day for adequate pain relief, after extensive discussion on what higher dose long-term benzodiazepines can do to the body, our compromise is 5 mg twice a day as needed until the patient can meet with the pain specialist at the end of January.  We will give 1 month at a time and frequently check in with patient.  He is aware of the increased fall risk, the increased risk of respiratory depression and drowsiness with his combination of medications.  We extensively reviewed his current medications for pain relief.  We decided together to stop the gabapentin and baclofen as these are not improving any kind of muscle spasm or pain.  Does find benefit from tinazidine, we will continue this 4 times a day as needed, along with the Mobic as prescribed, Tylenol with caution to not exceed 4000 g and to try to avoid taking that much daily, and the 5 mg of Valium twice a day.  He will use heat and warm showers as tolerated, with caution as previously sustained burns due to constant heating pad usage.  We will restart PT/OT/speech therapy.  He prefers home therapy as of now as he cannot drive.  We can reevaluate the need for home versus outpatient therapy in the future as well as I feel he would benefit more from outpatient therapy.

## 2021-03-08 NOTE — Chronic Care Management (AMB) (Signed)
Chronic Care Management    Clinical Social Work Note  03/08/2021 Name: David Reeves MRN: [id number] DOB: 12-23-1970  David Reeves is a 50 y.o. year old male who is a primary care patient of Alfredia Ferguson, [place]. The CCM team was consulted to assist the patient with chronic disease management and/or care coordination needs related to: [employer] .   Engaged with patient by telephone for follow up visit in response to provider referral for social work chronic care management and care coordination services.   Consent to Services:  The patient was given information about Chronic Care Management services, agreed to services, and gave verbal consent prior to initiation of services.  Please see initial visit note for detailed documentation.   Patient agreed to services and consent obtained.   Assessment: Review of patient past medical history, allergies, medications, and health status, including review of relevant consultants reports was performed today as part of a comprehensive evaluation and provision of chronic care management and care coordination services.     SDOH (Social Determinants of Health) assessments and interventions performed:    Advanced Directives Status: Not addressed in this encounter.  CCM Care Plan  Allergies  Allergen Reactions   Duloxetine     Other reaction(s): Other (See Comments) Increased temperature, sweating, uncontrolled shaking, aggressive thoughts   Amitriptyline Other (See Comments)    Urine retention   Buspirone Other (See Comments)    Kidney pain   Ciprofloxacin Other (See Comments)    hallucinations   Doxepin Other (See Comments)    "Didn't feel right"   Escitalopram Other (See Comments)    Agitation   Olanzapine    Penicillins Other (See Comments)    Unknown -- childhood reaction   Sertraline Other (See Comments)    Urine retention   Trazodone Other (See Comments)    abd pain, urine retention    Outpatient  Encounter Medications as of 03/08/2021  Medication Sig   amLODipine (NORVASC) 5 MG tablet Take 1 tablet (5 mg total) by mouth daily.   aspirin EC 81 MG tablet Take 81 mg by mouth daily. Swallow whole.   atorvastatin (LIPITOR) 80 MG tablet Take 1 tablet (80 mg total) by mouth daily.   Cholecalciferol (VITAMIN D3) 2000 units TABS Take by mouth.   diazepam (VALIUM) 5 MG tablet Take 1 tablet (5 mg total) by mouth every 12 (twelve) hours as needed for muscle spasms.   meloxicam (MOBIC) 15 MG tablet Take 1 tablet (15 mg total) by mouth daily.   Multiple Vitamin (MULTIVITAMIN) tablet Take 1 tablet by mouth daily.   tiZANidine (ZANAFLEX) 4 MG tablet Take 1 tablet (4 mg total) by mouth in the morning, at noon, in the evening, and at bedtime.   zolpidem (AMBIEN) 10 MG tablet Take 1 tablet (10 mg total) by mouth at bedtime.   No facility-administered encounter medications on file as of 03/08/2021.    Patient Active Problem List   Diagnosis Date Noted   Alterations of sensations following cerebrovascular accident 03/08/2021   Aphasia as late effect of cerebrovascular accident (CVA) 03/08/2021   Muscle spasm 02/22/2021   Acute non-recurrent frontal sinusitis 02/22/2021   Chronic pain syndrome 01/23/2021   Primary hypertension 01/23/2021   Nerve pain 01/23/2021   Erectile dysfunction due to diseases classified elsewhere 01/23/2021   Cognitive dysfunction 01/23/2021   Scrotal edema 01/23/2021   Elevated LDL cholesterol level 01/23/2021   History of stroke with residual deficit 01/23/2021   Primary insomnia 01/23/2021  Mouth pain 01/23/2021   Laceration of right hand without foreign body    Closed compression fracture of L1 vertebra (HCC) 05/24/2016   Lumbar stenosis with neurogenic claudication 11/01/2015   Chronic 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 11/08/2014   Hx of hemorrhoids 11/08/2014   AAA (abdominal aortic aneurysm) (HCC) 09/22/2014   Chronic pain associated with  significant psychosocial dysfunction 09/22/2014   Panic disorder 09/22/2014   AB (asthmatic bronchitis) 08/17/2014   Anxiety 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 08/17/2014   Backache 08/17/2014   Lumbosacral spondylosis without myelopathy 08/17/2014   Disorder of male genital organs 08/17/2014   Brash 08/17/2014   Low back pain 08/17/2014   Tendon nodule 08/17/2014   Episodic paroxysmal anxiety disorder 08/17/2014   Hernia, inguinal, right 08/17/2014   Fast heart beat 08/17/2014   Illness 08/17/2014   Inguinal hernia 10/14/2012    Conditions to be addressed/monitored:  Cognitive dysfunction; Limited social support and Transportation Care Plan : General Social Work (Adult)  Updates made by [employer], Clent Jacks, LCSW since 03/08/2021 12:00 AM     Problem: CHL AMB "PATIENT-SPECIFIC PROBLEM"   Priority: Medium  Note:   CARE PLAN ENTRY (see longitudinal plan of care for additional care plan information)  Current Barriers:  Patient with  cognitive dysfunction  -history of stroke-in need of assistance with connection to community resources  Knowledge deficits and need for support, education and care coordination related to community resources support  Financial constraints related to current medical condition, Limited social support, Transportation, ADL IADL limitations, Cognitive Deficits, and Lacks knowledge of community resource: Surveyor, quantity)  Over the next 90 days, patient will follow up with the Department of Social Services regarding the status of his Medicaid application as well as [employer] transportation to follow up on the coordination of transportation needs  Interventions provided by LCSW:  Assessed patient's care coordination needs related to need for transportation as well as Medicaid coverage and discussed ongoing care management follow up  Patient confirmed 3 emergency room visits due to back pain and nocturnal panic attacks-per patient he  now has a follow up with a psychiatrist on 05/17/21 Confirmed that patient has contacted [employer] and has enrolled for transportation to future medical appointments-transportation resource working out well Followed up on [employer] application-patient has signed application and returned it to the Department of Engineer, site for processing  Positive reinforcement provided regarding coordination of Transportation to medical appointment through [employer], follow up with psychiatrist and completion of medicaid application Confirmed that patient will receive an additional letter in the mail with additional documents needed-patient to contact this Child psychotherapist when received for additional assistance CCM social worker to continue to follow up on status of medicaid application   Patient Self Care Activities & Deficits:  Patient is unable to independently navigate community resource options without care coordination support  Acknowledges deficits and is motivated to resolve concern  Patient is able to contact [employer] as discussed today Unable to independently transport self to medical appointments, has difficulty with the functioning of his right side due to stroke Self administers medications as prescribed  Please see past updates related to this goal by clicking on the "Past Updates" button in the selected goal         Follow Up Plan: SW will follow up with patient by phone over the next 14 business days       [employer], [employer] Clinical Social Worker  [facility]  Care Management [phone]

## 2021-03-08 NOTE — Assessment & Plan Note (Signed)
Previously in speech therapy with progress, stopped due to increasing pain.  We will restart speech therapy at home.  Patient gets very frustrated when unable to find words or enunciate properly.  This often exacerbates his pain and aphasia.

## 2021-03-09 ENCOUNTER — Telehealth: Payer: Self-pay | Admitting: Family Medicine

## 2021-03-09 DIAGNOSIS — G894 Chronic pain syndrome: Secondary | ICD-10-CM | POA: Diagnosis not present

## 2021-03-09 DIAGNOSIS — M21371 Foot drop, right foot: Secondary | ICD-10-CM | POA: Diagnosis not present

## 2021-03-09 DIAGNOSIS — Z9181 History of falling: Secondary | ICD-10-CM | POA: Diagnosis not present

## 2021-03-09 DIAGNOSIS — Z79899 Other long term (current) drug therapy: Secondary | ICD-10-CM | POA: Diagnosis not present

## 2021-03-09 DIAGNOSIS — I69398 Other sequelae of cerebral infarction: Secondary | ICD-10-CM | POA: Diagnosis not present

## 2021-03-09 DIAGNOSIS — I6781 Acute cerebrovascular insufficiency: Secondary | ICD-10-CM | POA: Diagnosis not present

## 2021-03-09 DIAGNOSIS — M48061 Spinal stenosis, lumbar region without neurogenic claudication: Secondary | ICD-10-CM | POA: Diagnosis not present

## 2021-03-09 DIAGNOSIS — F5101 Primary insomnia: Secondary | ICD-10-CM | POA: Diagnosis not present

## 2021-03-10 NOTE — Telephone Encounter (Signed)
Pharmacy requesting PA for Zanaflex  Notes to clinic please assess. Requested Prescriptions  Pending Prescriptions Disp Refills   tiZANidine (ZANAFLEX) 4 MG tablet [Pharmacy Med Name: TIZANIDINE HCL 4 MG TABLET] 360 tablet 3    Sig: Take 1 tablet (4 mg total) by mouth in the morning, at noon, in the evening, and at bedtime.     Not Delegated - Cardiovascular:  Alpha-2 Agonists - tizanidine Failed - 03/09/2021  7:26 PM      Failed - This refill cannot be delegated      Passed - Valid encounter within last 6 months    Recent Outpatient Visits           3 days ago Alterations of sensations following cerebrovascular accident   [facility] Alfredia Ferguson, PA-C   2 weeks ago Muscle spasm   [facility] Jacky Kindle, FNP   1 month ago Nerve pain   [facility] Merita Norton T, FNP   2 months ago Cerebrovascular accident (CVA) due to occlusion of left middle cerebral artery [facility])   [facility] Chrismon, Jodell Cipro, PA-C   1 year ago Chronic pain syndrome   [employer], Jodell Cipro, PA-C       Future Appointments             In 1 month Ok Edwards, Lillia Abed, PA-C [employer], [facility]

## 2021-03-11 ENCOUNTER — Encounter: Payer: Self-pay | Admitting: Physician Assistant

## 2021-03-12 ENCOUNTER — Encounter: Payer: Self-pay | Admitting: Physician Assistant

## 2021-03-12 ENCOUNTER — Other Ambulatory Visit: Payer: Self-pay | Admitting: Family Medicine

## 2021-03-12 ENCOUNTER — Ambulatory Visit: Payer: Self-pay | Admitting: *Deleted

## 2021-03-12 ENCOUNTER — Telehealth: Payer: Self-pay | Admitting: Physician Assistant

## 2021-03-12 DIAGNOSIS — G894 Chronic pain syndrome: Secondary | ICD-10-CM

## 2021-03-12 DIAGNOSIS — I69320 Aphasia following cerebral infarction: Secondary | ICD-10-CM

## 2021-03-12 DIAGNOSIS — I10 Essential (primary) hypertension: Secondary | ICD-10-CM

## 2021-03-12 DIAGNOSIS — F410 Panic disorder [episodic paroxysmal anxiety] without agoraphobia: Secondary | ICD-10-CM

## 2021-03-12 NOTE — Telephone Encounter (Signed)
Mardene Celeste with [facility] called needing  discharge order from speech therapy.    B#  [phone]

## 2021-03-12 NOTE — Telephone Encounter (Signed)
Pt called back reporting that he did not receive the supply from 02/22/2021, wants to speak to office as soon as possible. Pt is very frustrated and has profanity/obscene language multiple times. Please advise

## 2021-03-12 NOTE — Telephone Encounter (Signed)
[facility] to give to office #[phone] / the PA was needed for tiZANidine (ZANAFLEX) 4 MG tablet/ pt stated he also called [facility]  Pt stated he needs [facility] and in the future for further refills send to [facility] / Pt asked for a call today from [place] or the nurse/ please advise

## 2021-03-12 NOTE — Telephone Encounter (Signed)
Will try and generate request for pre-authorization through cover my meds. KW

## 2021-03-13 ENCOUNTER — Encounter: Payer: Self-pay | Admitting: Physician Assistant

## 2021-03-13 ENCOUNTER — Encounter: Payer: Medicare HMO | Admitting: Speech Pathology

## 2021-03-13 ENCOUNTER — Ambulatory Visit: Payer: Medicare HMO

## 2021-03-13 ENCOUNTER — Ambulatory Visit: Payer: Self-pay

## 2021-03-13 DIAGNOSIS — I10 Essential (primary) hypertension: Secondary | ICD-10-CM

## 2021-03-13 DIAGNOSIS — M62838 Other muscle spasm: Secondary | ICD-10-CM

## 2021-03-13 DIAGNOSIS — E7800 Pure hypercholesterolemia, unspecified: Secondary | ICD-10-CM

## 2021-03-13 NOTE — Chronic Care Management (AMB) (Signed)
Chronic Care Management   CCM RN Visit Note  03/13/2021 Name: David Reeves MRN: [id number] DOB: 1970-10-02  Subjective: David Reeves is a 50 y.o. year old male who is a primary care patient of [name], [place]. The care management team was consulted for assistance with disease management and care coordination needs.    Engaged with patient by telephone for follow up visit in response to provider referral for case management and care coordination services.   Consent to Services:  The patient was given information about Chronic Care Management services, agreed to services, and gave verbal consent prior to initiation of services.  Please see initial visit note for detailed documentation.    Assessment: Review of patient past medical history, allergies, medications, health status, including review of consultants reports, laboratory and other test data, was performed as part of comprehensive evaluation and provision of chronic care management services.   SDOH (Social Determinants of Health) assessments and interventions performed: No  CCM Care Plan  Allergies  Allergen Reactions   Duloxetine     Other reaction(s): Other (See Comments) Increased temperature, sweating, uncontrolled shaking, aggressive thoughts   Amitriptyline Other (See Comments)    Urine retention   Buspirone Other (See Comments)    Kidney pain   Ciprofloxacin Other (See Comments)    hallucinations   Doxepin Other (See Comments)    "Didn't feel right"   Escitalopram Other (See Comments)    Agitation   Olanzapine    Penicillins Other (See Comments)    Unknown -- childhood reaction   Sertraline Other (See Comments)    Urine retention   Trazodone Other (See Comments)    abd pain, urine retention    Outpatient Encounter Medications as of 03/13/2021  Medication Sig   amLODipine (NORVASC) 5 MG tablet Take 1 tablet (5 mg total) by mouth daily.   aspirin EC 81 MG tablet Take 81 mg by mouth  daily. Swallow whole.   atorvastatin (LIPITOR) 80 MG tablet Take 1 tablet (80 mg total) by mouth daily.   Cholecalciferol (VITAMIN D3) 2000 units TABS Take by mouth.   diazepam (VALIUM) 5 MG tablet Take 1 tablet (5 mg total) by mouth every 12 (twelve) hours as needed for muscle spasms.   meloxicam (MOBIC) 15 MG tablet Take 1 tablet (15 mg total) by mouth daily.   Multiple Vitamin (MULTIVITAMIN) tablet Take 1 tablet by mouth daily.   tiZANidine (ZANAFLEX) 4 MG tablet Take 1 tablet (4 mg total) by mouth in the morning, at noon, in the evening, and at bedtime.   zolpidem (AMBIEN) 10 MG tablet Take 1 tablet (10 mg total) by mouth at bedtime.   No facility-administered encounter medications on file as of 03/13/2021.    Patient Active Problem List   Diagnosis Date Noted   Alterations of sensations following cerebrovascular accident 03/08/2021   Aphasia as late effect of cerebrovascular accident (CVA) 03/08/2021   Muscle spasm 02/22/2021   Acute non-recurrent frontal sinusitis 02/22/2021   Chronic pain syndrome 01/23/2021   Primary hypertension 01/23/2021   Nerve pain 01/23/2021   Erectile dysfunction due to diseases classified elsewhere 01/23/2021   Cognitive dysfunction 01/23/2021   Scrotal edema 01/23/2021   Elevated LDL cholesterol level 01/23/2021   History of stroke with residual deficit 01/23/2021   Primary insomnia 01/23/2021   Mouth pain 01/23/2021   Laceration of right hand without foreign body    Closed compression fracture of L1 vertebra ([place]) 05/24/2016   Lumbar stenosis with neurogenic claudication  11/01/2015   Chronic 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 11/08/2014   Hx of hemorrhoids 11/08/2014   AAA (abdominal aortic aneurysm) ([place]) 09/22/2014   Chronic pain associated with significant psychosocial dysfunction 09/22/2014   Panic disorder 09/22/2014   AB (asthmatic bronchitis) 08/17/2014   Anxiety 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 08/17/2014    Backache 08/17/2014   Lumbosacral spondylosis without myelopathy 08/17/2014   Disorder of male genital organs 08/17/2014   Brash 08/17/2014   Low back pain 08/17/2014   Tendon nodule 08/17/2014   Episodic paroxysmal anxiety disorder 08/17/2014   Hernia, inguinal, right 08/17/2014   Fast heart beat 08/17/2014   Illness 08/17/2014   Inguinal hernia 10/14/2012     Patient Care Plan: RN Care Manager Plan of Care     Problem Identified: Hypertension and Hyperlipidemia      Long-Range Goal: Disease Progression Prevented or Minimized   Start Date: 02/12/2021  Expected End Date: 05/13/2021  Priority: High  Note:   Current Barriers:  Chronic Disease Management support and education needs related to HTN and HLD   RNCM Clinical Goal(s):  Patient will adhere to treatment plan for management of HTN and HLD Patient will work with the [employer] to discuss options for transportation and nutrition assistance.   Interventions: 1:1 collaboration with primary care provider regarding development and update of comprehensive plan of care as evidenced by provider attestation and co-signature Inter-disciplinary care team collaboration (see longitudinal plan of care) Evaluation of current treatment plan related to  self management and patient's adherence to plan as established by provider  Hypertension Interventions: BP Readings from Last 3 Encounters:  02/22/21 139/88  02/16/21 127/77  01/28/21 (!) 136/91     Most recent eGFR/CrCl:  Lab Results  Component Value Date   EGFR 108 12/14/2020    No components found for: CRCL    Evaluation of current treatment plan related to hypertension self management and patient's adherence to plan as established by provider; Provided education to patient re: stroke prevention, s/s of heart attack and stroke; Reviewed medications with and discussed importance of compliance. Advised to update the care management team with concerns regarding  prescription cost. Discussed established BP parameters and indications for notifying a provider. Advised to monitor BP daily and record readings. Reports his BP is currently being monitored by the Home Health staff. Reports readings have been within range. Provided education regarding heart health/cardiac prudent food options. Advised to more read nutrition labels, monitor sodium intake and avoid highly processed foods when possible.  Discussed importance of engaging in physical activity. His activity is currently limited chronic pain. He is currently receiving Home Health Physical Therapy.  Discussed complications of poorly controlled blood pressure such as heart disease, stroke, circulatory complications, vision complications and kidney impairment. Update on 03/13/21: Patient required assistance with obtaining medications for muscle spasms. Reports experiencing discomfort that is not heart related but d/t severe generalized muscle spasms. Per chart review, patient's tizanidine was increased to 73m four times a day. [place] Per staff, prior authorization was confirmed and the updated prescription will be mailed out once the PCP order is received. [facility] processed. [facility] confirmed [facility]idine will be filled today. Mr. KDorrwill be contacted when the prescription is ready for pick up.   Patient Goals/Self-Care Activities: Patient will self administer medications as prescribed Patient will attend all scheduled provider appointments Patient will complete labs as  advised Patient will call pharmacy for medication refills Patient will continue to perform ADL's independently       PLAN A member of the care management team will follow up within the next month.   [facility] Care Management [facility] [phone]

## 2021-03-13 NOTE — Chronic Care Management (AMB) (Signed)
Chronic Care Management    Clinical Social Work Note  03/13/2021 Name: David Reeves MRN: [id number] DOB: May 05, 1970  David Reeves is a 50 y.o. year old male who is a primary care patient of Alfredia Ferguson, [place]. The CCM team was consulted to assist the patient with chronic disease management and/or care coordination needs related to: [employer] .   Engaged with patient by telephone for  acute follow up call   in response to provider referral for social work chronic care management and care coordination services.   Consent to Services:  The patient was given information about Chronic Care Management services, agreed to services, and gave verbal consent prior to initiation of services.  Please see initial visit note for detailed documentation.   Patient agreed to services and consent obtained.   Assessment: Review of patient past medical history, allergies, medications, and health status, including review of relevant consultants reports was performed today as part of a comprehensive evaluation and provision of chronic care management and care coordination services.     SDOH (Social Determinants of Health) assessments and interventions performed:    Advanced Directives Status: Not addressed in this encounter.  CCM Care Plan  Allergies  Allergen Reactions   Duloxetine     Other reaction(s): Other (See Comments) Increased temperature, sweating, uncontrolled shaking, aggressive thoughts   Amitriptyline Other (See Comments)    Urine retention   Buspirone Other (See Comments)    Kidney pain   Ciprofloxacin Other (See Comments)    hallucinations   Doxepin Other (See Comments)    "Didn't feel right"   Escitalopram Other (See Comments)    Agitation   Olanzapine    Penicillins Other (See Comments)    Unknown -- childhood reaction   Sertraline Other (See Comments)    Urine retention   Trazodone Other (See Comments)    abd pain, urine retention     Outpatient Encounter Medications as of 03/12/2021  Medication Sig   amLODipine (NORVASC) 5 MG tablet Take 1 tablet (5 mg total) by mouth daily.   aspirin EC 81 MG tablet Take 81 mg by mouth daily. Swallow whole.   atorvastatin (LIPITOR) 80 MG tablet Take 1 tablet (80 mg total) by mouth daily.   Cholecalciferol (VITAMIN D3) 2000 units TABS Take by mouth.   diazepam (VALIUM) 5 MG tablet Take 1 tablet (5 mg total) by mouth every 12 (twelve) hours as needed for muscle spasms.   meloxicam (MOBIC) 15 MG tablet Take 1 tablet (15 mg total) by mouth daily.   Multiple Vitamin (MULTIVITAMIN) tablet Take 1 tablet by mouth daily.   tiZANidine (ZANAFLEX) 4 MG tablet Take 1 tablet (4 mg total) by mouth in the morning, at noon, in the evening, and at bedtime.   zolpidem (AMBIEN) 10 MG tablet Take 1 tablet (10 mg total) by mouth at bedtime.   No facility-administered encounter medications on file as of 03/12/2021.    Patient Active Problem List   Diagnosis Date Noted   Alterations of sensations following cerebrovascular accident 03/08/2021   Aphasia as late effect of cerebrovascular accident (CVA) 03/08/2021   Muscle spasm 02/22/2021   Acute non-recurrent frontal sinusitis 02/22/2021   Chronic pain syndrome 01/23/2021   Primary hypertension 01/23/2021   Nerve pain 01/23/2021   Erectile dysfunction due to diseases classified elsewhere 01/23/2021   Cognitive dysfunction 01/23/2021   Scrotal edema 01/23/2021   Elevated LDL cholesterol level 01/23/2021   History of stroke with residual deficit 01/23/2021  Primary insomnia 01/23/2021   Mouth pain 01/23/2021   Laceration of right hand without foreign body    Closed compression fracture of L1 vertebra (HCC) 05/24/2016   Lumbar stenosis with neurogenic claudication 11/01/2015   Chronic 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 11/08/2014   Hx of hemorrhoids 11/08/2014   AAA (abdominal aortic aneurysm) (HCC) 09/22/2014   Chronic pain  associated with significant psychosocial dysfunction 09/22/2014   Panic disorder 09/22/2014   AB (asthmatic bronchitis) 08/17/2014   Anxiety 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 08/17/2014   Backache 08/17/2014   Lumbosacral spondylosis without myelopathy 08/17/2014   Disorder of male genital organs 08/17/2014   Brash 08/17/2014   Low back pain 08/17/2014   Tendon nodule 08/17/2014   Episodic paroxysmal anxiety disorder 08/17/2014   Hernia, inguinal, right 08/17/2014   Fast heart beat 08/17/2014   Illness 08/17/2014   Inguinal hernia 10/14/2012    Conditions to be addressed/monitored:  Cognitive dysfunction; Limited social support and Transportation Care Plan : General Social Work (Adult)  Updates made by [employer], Clent Jacks, LCSW since 03/13/2021 12:00 AM     Problem: CHL AMB "PATIENT-SPECIFIC PROBLEM"   Priority: Medium  Note:   CARE PLAN ENTRY (see longitudinal plan of care for additional care plan information)  Current Barriers:  Patient with  cognitive dysfunction  -history of stroke-in need of assistance with connection to community resources  Knowledge deficits and need for support, education and care coordination related to community resources support  Financial constraints related to current medical condition, Limited social support, Transportation, ADL IADL limitations, Cognitive Deficits, and Lacks knowledge of community resource: Surveyor, quantity)  Over the next 90 days, patient will follow up with the Department of Social Services regarding the status of his Medicaid application as well as [employer] transportation to follow up on the coordination of transportation needs  Interventions provided by LCSW:  Assessed patient's care coordination needs related to need for transportation as well as Medicaid coverage and discussed ongoing care management follow up  Patient confirmed 3 emergency room visits due to back pain and nocturnal panic  attacks-per patient he now has a follow up with a psychiatrist on 05/17/21 Confirmed that patient has contacted [employer] and has enrolled for transportation to future medical appointments-transportation resource working out well Followed up on [employer] application-patient has signed application and returned it to the Department of Engineer, site for processing  Positive reinforcement provided regarding coordination of Transportation to medical appointment through [employer], follow up with psychiatrist and completion of medicaid application Confirmed that patient will receive an additional letter in the mail with additional documents needed-patient to contact this Child psychotherapist when received for additional assistance CCM social worker to continue to follow up on status of medicaid application 03/13/21 Phone call from patient stating concerns related to prior auth for Tizanidine. Collaboration phone call to [facility] [phone] prior auth pending with additional information needed from doctor reference number [id number] [phone]. CCM RNCM notified and agreeable to follow up with patient and provider.  Patient Self Care Activities & Deficits:  Patient is unable to independently navigate community resource options without care coordination support  Acknowledges deficits and is motivated to resolve concern  Patient is able to contact [employer] as discussed today Unable to independently transport self to medical appointments, has difficulty with the functioning of his right side due to stroke Self administers medications as prescribed  Please see past updates related to this goal by clicking on the "  Past Updates" button in the selected goal         Follow Up Plan: SW will follow up with patient by phone over the next 14 days       Anali Cabanilla, [place] Clinical Social Worker  [facility] Care Management [phone]

## 2021-03-13 NOTE — Patient Instructions (Signed)
Visit Information  Thank you for taking time to visit with me. Please don't hesitate to contact me if I can be of assistance to you before our next scheduled telephone appointment.  Please contact the clinic if you require additional assistance or if you are unable to obtain the prescribed tizanidine at [facility].  A member of the care management team will follow up within the next month.     [facility] Care Management [facility] [phone]

## 2021-03-13 NOTE — Patient Instructions (Signed)
Visit Information  Thank you for taking time to visit with me today. Please don't hesitate to contact me if I can be of assistance to you before our next scheduled telephone appointment.  Following are the goals we discussed today:  Follow up with primary doctor regarding prior authorization for [name]  Our next appointment is by telephone on 03/22/21 at 10am  Please call the care guide team at [phone] if you need to cancel or reschedule your appointment.   Please call the Suicide and Crisis Lifeline: 988 call 911 if you are experiencing a Mental Health or Behavioral Health Crisis or need someone to talk to.   Following is a copy of your full plan of care:  Care Plan : General Social Work (Adult)  Updates made by [employer], Clent Jacks, LCSW since 03/13/2021 12:00 AM     Problem: CHL AMB "PATIENT-SPECIFIC PROBLEM"   Priority: Medium  Note:   CARE PLAN ENTRY (see longitudinal plan of care for additional care plan information)  Current Barriers:  Patient with  cognitive dysfunction  -history of stroke-in need of assistance with connection to community resources  Knowledge deficits and need for support, education and care coordination related to community resources support  Financial constraints related to current medical condition, Limited social support, Transportation, ADL IADL limitations, Cognitive Deficits, and Lacks knowledge of community resource: Transportation resources  Clinical Goal(s)  Over the next 90 days, patient will follow up with the Department of Social Services regarding the status of his Medicaid application as well as [employer] transportation to follow up on the coordination of transportation needs  Interventions provided by LCSW:  Assessed patient's care coordination needs related to need for transportation as well as Medicaid coverage and discussed ongoing care management follow up  Patient confirmed 3 emergency room visits due to back pain and nocturnal panic  attacks-per patient he now has a follow up with a psychiatrist on 05/17/21 Confirmed that patient has contacted [employer] and has enrolled for transportation to future medical appointments-transportation resource working out well Followed up on [employer] application-patient has signed application and returned it to the Department of Engineer, site for processing  Positive reinforcement provided regarding coordination of Transportation to medical appointment through [employer], follow up with psychiatrist and completion of medicaid application Confirmed that patient will receive an additional letter in the mail with additional documents needed-patient to contact this Child psychotherapist when received for additional assistance CCM social worker to continue to follow up on status of medicaid application 03/13/21 Phone call from patient stating concerns related to prior auth for Tizanidine. Collaboration phone call to [facility] [phone] prior auth pending with additional information needed from doctor reference number [id number] [phone]. CCM RNCM notified and agreeable to follow up with patient and provider.  Patient Self Care Activities & Deficits:  Patient is unable to independently navigate community resource options without care coordination support  Acknowledges deficits and is motivated to resolve concern  Patient is able to contact [employer] as discussed today Unable to independently transport self to medical appointments, has difficulty with the functioning of his right side due to stroke Self administers medications as prescribed  Please see past updates related to this goal by clicking on the "Past Updates" button in the selected goal       The patient verbalized understanding of instructions, educational materials, and care plan provided today and declined offer to receive copy of patient instructions, educational materials, and care plan.   Telephone  follow up appointment  with care management team member scheduled for: 03/22/21   David Czech, LCSW Clinical Social Worker  [facility] Care Management [phone]

## 2021-03-14 ENCOUNTER — Other Ambulatory Visit: Payer: Self-pay | Admitting: Physician Assistant

## 2021-03-14 DIAGNOSIS — G894 Chronic pain syndrome: Secondary | ICD-10-CM | POA: Diagnosis not present

## 2021-03-14 DIAGNOSIS — Z79899 Other long term (current) drug therapy: Secondary | ICD-10-CM | POA: Diagnosis not present

## 2021-03-14 DIAGNOSIS — I6781 Acute cerebrovascular insufficiency: Secondary | ICD-10-CM | POA: Diagnosis not present

## 2021-03-14 DIAGNOSIS — M21371 Foot drop, right foot: Secondary | ICD-10-CM | POA: Diagnosis not present

## 2021-03-14 DIAGNOSIS — I6930 Unspecified sequelae of cerebral infarction: Secondary | ICD-10-CM

## 2021-03-14 DIAGNOSIS — F5101 Primary insomnia: Secondary | ICD-10-CM | POA: Diagnosis not present

## 2021-03-14 DIAGNOSIS — M48061 Spinal stenosis, lumbar region without neurogenic claudication: Secondary | ICD-10-CM | POA: Diagnosis not present

## 2021-03-14 DIAGNOSIS — G8929 Other chronic pain: Secondary | ICD-10-CM

## 2021-03-14 DIAGNOSIS — I69398 Other sequelae of cerebral infarction: Secondary | ICD-10-CM | POA: Diagnosis not present

## 2021-03-14 DIAGNOSIS — Z9181 History of falling: Secondary | ICD-10-CM | POA: Diagnosis not present

## 2021-03-14 NOTE — Progress Notes (Signed)
Per patient my chart messaging conversation He would NOT like home pt/ot as we discussed But would rather outpt pt/ot at [facility]

## 2021-03-15 DIAGNOSIS — Z79899 Other long term (current) drug therapy: Secondary | ICD-10-CM | POA: Diagnosis not present

## 2021-03-15 DIAGNOSIS — M48061 Spinal stenosis, lumbar region without neurogenic claudication: Secondary | ICD-10-CM | POA: Diagnosis not present

## 2021-03-15 DIAGNOSIS — I6781 Acute cerebrovascular insufficiency: Secondary | ICD-10-CM | POA: Diagnosis not present

## 2021-03-15 DIAGNOSIS — I69398 Other sequelae of cerebral infarction: Secondary | ICD-10-CM | POA: Diagnosis not present

## 2021-03-15 DIAGNOSIS — Z9181 History of falling: Secondary | ICD-10-CM | POA: Diagnosis not present

## 2021-03-15 DIAGNOSIS — G894 Chronic pain syndrome: Secondary | ICD-10-CM | POA: Diagnosis not present

## 2021-03-15 DIAGNOSIS — F5101 Primary insomnia: Secondary | ICD-10-CM | POA: Diagnosis not present

## 2021-03-15 DIAGNOSIS — M21371 Foot drop, right foot: Secondary | ICD-10-CM | POA: Diagnosis not present

## 2021-03-19 ENCOUNTER — Encounter: Payer: Self-pay | Admitting: Physician Assistant

## 2021-03-19 ENCOUNTER — Other Ambulatory Visit: Payer: Self-pay | Admitting: Physician Assistant

## 2021-03-19 ENCOUNTER — Ambulatory Visit: Payer: Medicare HMO | Admitting: Family Medicine

## 2021-03-19 DIAGNOSIS — G894 Chronic pain syndrome: Secondary | ICD-10-CM

## 2021-03-19 DIAGNOSIS — I69398 Other sequelae of cerebral infarction: Secondary | ICD-10-CM | POA: Diagnosis not present

## 2021-03-19 DIAGNOSIS — F5101 Primary insomnia: Secondary | ICD-10-CM | POA: Diagnosis not present

## 2021-03-19 DIAGNOSIS — Z79899 Other long term (current) drug therapy: Secondary | ICD-10-CM | POA: Diagnosis not present

## 2021-03-19 DIAGNOSIS — Z9181 History of falling: Secondary | ICD-10-CM | POA: Diagnosis not present

## 2021-03-19 DIAGNOSIS — M21371 Foot drop, right foot: Secondary | ICD-10-CM | POA: Diagnosis not present

## 2021-03-19 DIAGNOSIS — I6781 Acute cerebrovascular insufficiency: Secondary | ICD-10-CM | POA: Diagnosis not present

## 2021-03-19 DIAGNOSIS — M48061 Spinal stenosis, lumbar region without neurogenic claudication: Secondary | ICD-10-CM | POA: Diagnosis not present

## 2021-03-19 NOTE — Addendum Note (Signed)
Addended by: Kavin Leech E on: 03/19/2021 10:38 AM   Modules accepted: Orders

## 2021-03-19 NOTE — Telephone Encounter (Signed)
[facility] faxed refill request for the following medications:  tiZANidine (ZANAFLEX) 4 MG tablet    Please advise.

## 2021-03-19 NOTE — Telephone Encounter (Unsigned)
Copied from CRM [id number]. Topic: Quick Communication - Rx Refill/Question >> Mar 19, 2021  5:02 PM Marylen Ponto wrote: Shawna Orleans with [facility] requests that the following Rx be transferred. Shawna Orleans stated she has tried to get it transferred but she has been unsuccessful.  Medication: tiZANidine (ZANAFLEX) 4 MG tablet  Has the patient contacted their pharmacy? Yes.   (Agent: If no, request that the patient contact the pharmacy for the refill. If patient does not wish to contact the pharmacy document the reason why and proceed with request.) (Agent: If yes, when and what did the pharmacy advise?)  Preferred Pharmacy (with phone number or street name): [facility] - [place], [place] - [address]  Phone: [phone]   Fax: [phone]  Has the patient been seen for an appointment in the last year OR does the patient have an upcoming appointment? Yes.    Agent: Please be advised that RX refills may take up to 3 business days. We ask that you follow-up with your pharmacy.

## 2021-03-19 NOTE — Telephone Encounter (Signed)
LOV:  03/07/2021  NOV: 05/03/2021   Last Refill:  02/22/2021 #360 3 Refills.  Thanks,   -Vernona Rieger

## 2021-03-20 ENCOUNTER — Other Ambulatory Visit: Payer: Self-pay

## 2021-03-20 ENCOUNTER — Encounter: Payer: Medicare HMO | Admitting: Speech Pathology

## 2021-03-20 ENCOUNTER — Ambulatory Visit: Payer: Medicare HMO

## 2021-03-20 DIAGNOSIS — G894 Chronic pain syndrome: Secondary | ICD-10-CM

## 2021-03-20 NOTE — Telephone Encounter (Signed)
Please review request below to have medication transferred.

## 2021-03-20 NOTE — Telephone Encounter (Signed)
Copied from CRM [id number]. Topic: Quick Communication - Rx Refill/Question >> Mar 19, 2021  5:02 PM Marylen Ponto wrote: Shawna Orleans with [facility] requests that the following Rx be transferred. Shawna Orleans stated she has tried to get it transferred but she has been unsuccessful.  Medication: tiZANidine (ZANAFLEX) 4 MG tablet  Has the patient contacted their pharmacy? Yes.   (Agent: If no, request that the patient contact the pharmacy for the refill. If patient does not wish to contact the pharmacy document the reason why and proceed with request.) (Agent: If yes, when and what did the pharmacy advise?)  Preferred Pharmacy (with phone number or street name): [facility] - [place], [place] - [address]  Phone: [phone]   Fax: [phone]  Has the patient been seen for an appointment in the last year OR does the patient have an upcoming appointment? Yes.    Agent: Please be advised that RX refills may take up to 3 business days. We ask that you follow-up with your pharmacy.

## 2021-03-21 ENCOUNTER — Telehealth: Payer: Self-pay

## 2021-03-21 ENCOUNTER — Other Ambulatory Visit: Payer: Self-pay | Admitting: Family Medicine

## 2021-03-21 DIAGNOSIS — E7800 Pure hypercholesterolemia, unspecified: Secondary | ICD-10-CM | POA: Diagnosis not present

## 2021-03-21 DIAGNOSIS — I10 Essential (primary) hypertension: Secondary | ICD-10-CM | POA: Diagnosis not present

## 2021-03-21 DIAGNOSIS — F5101 Primary insomnia: Secondary | ICD-10-CM

## 2021-03-21 MED ORDER — TIZANIDINE HCL 4 MG PO TABS: 4.0000 mg | ORAL_TABLET | Freq: Four times a day (QID) | ORAL | 0 refills | Status: DC

## 2021-03-21 NOTE — Telephone Encounter (Signed)
Copied from CRM [id number]. Topic: General - Other >> Mar 21, 2021 11:31 AM Pawlus, Maxine Glenn A wrote: Reason for CRM: [facility] stated they need the sleep study order to be signed and also needs to have the sleep study type listed, please fax to [phone]

## 2021-03-21 NOTE — Telephone Encounter (Signed)
[facility] This is a future rx, not to be filled until after 06/05/2021

## 2021-03-22 ENCOUNTER — Ambulatory Visit: Payer: Medicare HMO

## 2021-03-22 ENCOUNTER — Encounter: Payer: Medicare HMO | Admitting: Speech Pathology

## 2021-03-22 ENCOUNTER — Ambulatory Visit (INDEPENDENT_AMBULATORY_CARE_PROVIDER_SITE_OTHER): Payer: Medicare HMO | Admitting: *Deleted

## 2021-03-22 ENCOUNTER — Other Ambulatory Visit: Payer: Self-pay

## 2021-03-22 ENCOUNTER — Other Ambulatory Visit: Payer: Self-pay | Admitting: Physician Assistant

## 2021-03-22 DIAGNOSIS — Z79899 Other long term (current) drug therapy: Secondary | ICD-10-CM | POA: Diagnosis not present

## 2021-03-22 DIAGNOSIS — M48061 Spinal stenosis, lumbar region without neurogenic claudication: Secondary | ICD-10-CM | POA: Diagnosis not present

## 2021-03-22 DIAGNOSIS — F5101 Primary insomnia: Secondary | ICD-10-CM

## 2021-03-22 DIAGNOSIS — M62838 Other muscle spasm: Secondary | ICD-10-CM

## 2021-03-22 DIAGNOSIS — I10 Essential (primary) hypertension: Secondary | ICD-10-CM

## 2021-03-22 DIAGNOSIS — Z9181 History of falling: Secondary | ICD-10-CM | POA: Diagnosis not present

## 2021-03-22 DIAGNOSIS — M21371 Foot drop, right foot: Secondary | ICD-10-CM | POA: Diagnosis not present

## 2021-03-22 DIAGNOSIS — I6930 Unspecified sequelae of cerebral infarction: Secondary | ICD-10-CM

## 2021-03-22 DIAGNOSIS — I6781 Acute cerebrovascular insufficiency: Secondary | ICD-10-CM | POA: Diagnosis not present

## 2021-03-22 DIAGNOSIS — G8929 Other chronic pain: Secondary | ICD-10-CM

## 2021-03-22 DIAGNOSIS — I69398 Other sequelae of cerebral infarction: Secondary | ICD-10-CM | POA: Diagnosis not present

## 2021-03-22 DIAGNOSIS — G894 Chronic pain syndrome: Secondary | ICD-10-CM | POA: Diagnosis not present

## 2021-03-22 NOTE — Chronic Care Management (AMB) (Signed)
Chronic Care Management    Clinical Social Work Note  03/22/2021 Name: David Reeves MRN: [id number] DOB: 1971/03/03  Suleiman Finigan is a 50 y.o. year old male who is a primary care patient of Alfredia Ferguson, [place]. The CCM team was consulted to assist the patient with chronic disease management and/or care coordination needs related to: [employer] .   Engaged with patient by telephone for follow up visit in response to provider referral for social work chronic care management and care coordination services.   Consent to Services:  The patient was given information about Chronic Care Management services, agreed to services, and gave verbal consent prior to initiation of services.  Please see initial visit note for detailed documentation.   Patient agreed to services and consent obtained.   Assessment: Review of patient past medical history, allergies, medications, and health status, including review of relevant consultants reports was performed today as part of a comprehensive evaluation and provision of chronic care management and care coordination services.     SDOH (Social Determinants of Health) assessments and interventions performed:    Advanced Directives Status: Not addressed in this encounter.  CCM Care Plan  Allergies  Allergen Reactions   Duloxetine     Other reaction(s): Other (See Comments) Increased temperature, sweating, uncontrolled shaking, aggressive thoughts   Amitriptyline Other (See Comments)    Urine retention   Buspirone Other (See Comments)    Kidney pain   Ciprofloxacin Other (See Comments)    hallucinations   Doxepin Other (See Comments)    "Didn't feel right"   Escitalopram Other (See Comments)    Agitation   Olanzapine    Penicillins Other (See Comments)    Unknown -- childhood reaction   Sertraline Other (See Comments)    Urine retention   Trazodone Other (See Comments)    abd pain, urine retention    Outpatient  Encounter Medications as of 03/22/2021  Medication Sig   amLODipine (NORVASC) 5 MG tablet Take 1 tablet (5 mg total) by mouth daily.   aspirin EC 81 MG tablet Take 81 mg by mouth daily. Swallow whole.   atorvastatin (LIPITOR) 80 MG tablet Take 1 tablet (80 mg total) by mouth daily.   Cholecalciferol (VITAMIN D3) 2000 units TABS Take by mouth.   diazepam (VALIUM) 5 MG tablet Take 1 tablet (5 mg total) by mouth every 12 (twelve) hours as needed for muscle spasms.   meloxicam (MOBIC) 15 MG tablet Take 1 tablet (15 mg total) by mouth daily.   Multiple Vitamin (MULTIVITAMIN) tablet Take 1 tablet by mouth daily.   [START ON 06/05/2021] tiZANidine (ZANAFLEX) 4 MG tablet Take 1 tablet (4 mg total) by mouth in the morning, at noon, in the evening, and at bedtime.   zolpidem (AMBIEN) 10 MG tablet Take 1 tablet (10 mg total) by mouth at bedtime.   No facility-administered encounter medications on file as of 03/22/2021.    Patient Active Problem List   Diagnosis Date Noted   Alterations of sensations following cerebrovascular accident 03/08/2021   Aphasia as late effect of cerebrovascular accident (CVA) 03/08/2021   Muscle spasm 02/22/2021   Acute non-recurrent frontal sinusitis 02/22/2021   Chronic pain syndrome 01/23/2021   Primary hypertension 01/23/2021   Nerve pain 01/23/2021   Erectile dysfunction due to diseases classified elsewhere 01/23/2021   Cognitive dysfunction 01/23/2021   Scrotal edema 01/23/2021   Elevated LDL cholesterol level 01/23/2021   History of stroke with residual deficit 01/23/2021   Primary  insomnia 01/23/2021   Mouth pain 01/23/2021   Laceration of right hand without foreign body    Closed compression fracture of L1 vertebra (HCC) 05/24/2016   Lumbar stenosis with neurogenic claudication 11/01/2015   Chronic 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 11/08/2014   Hx of hemorrhoids 11/08/2014   AAA (abdominal aortic aneurysm) (HCC) 09/22/2014   Chronic pain  associated with significant psychosocial dysfunction 09/22/2014   Panic disorder 09/22/2014   AB (asthmatic bronchitis) 08/17/2014   Anxiety 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 08/17/2014   Backache 08/17/2014   Lumbosacral spondylosis without myelopathy 08/17/2014   Disorder of male genital organs 08/17/2014   Brash 08/17/2014   Low back pain 08/17/2014   Tendon nodule 08/17/2014   Episodic paroxysmal anxiety disorder 08/17/2014   Hernia, inguinal, right 08/17/2014   Fast heart beat 08/17/2014   Illness 08/17/2014   Inguinal hernia 10/14/2012    Conditions to be addressed/monitored: Cognitive dysfunction; Limited social support and Transportation Care Plan : General Social Work (Adult)  Updates made by [employer], Clent Jacks, LCSW since 03/22/2021 12:00 AM     Problem: CHL AMB "PATIENT-SPECIFIC PROBLEM"   Priority: Medium  Note:   CARE PLAN ENTRY (see longitudinal plan of care for additional care plan information)  Current Barriers:  Patient with  cognitive dysfunction  -history of stroke-in need of assistance with connection to community resources  Knowledge deficits and need for support, education and care coordination related to community resources support  Financial constraints related to current medical condition, Limited social support, Transportation, ADL IADL limitations, Cognitive Deficits, and Lacks knowledge of community resource: Surveyor, quantity)  Over the next 90 days, patient will follow up with the Department of Social Services regarding the status of his Medicaid application as well as [employer] transportation to follow up on the coordination of transportation needs  Interventions provided by LCSW:  Assessed patient's care coordination needs related to need for transportation as well as Medicaid coverage and discussed ongoing care management follow up  Patient continues to confirm follow up with a psychiatrist on 05/17/21 Confirmed that  Medication has been authorized received(Tizanidine) Confirmed that patient has contacted [employer] and has enrolled for transportation to future medical appointments-transportation resource working out well Followed up on [employer] application-patient contacted by the Department of Social Services-follow up assessment completed Positive reinforcement provided regarding coordination of Transportation to medical appointment through [employer], follow up with psychiatrist and medicaid application follow up  Patient Self Care Activities & Deficits:  Patient is unable to independently navigate community resource options without care coordination support  Acknowledges deficits and is motivated to resolve concern  Patient is able to contact [employer] as discussed today Unable to independently transport self to medical appointments, has difficulty with the functioning of his right side due to stroke Self administers medications as prescribed  Please see past updates related to this goal by clicking on the "Past Updates" button in the selected goal         Follow Up Plan: SW will follow up with patient by phone over the next 30 days       Loral Campi, LCSW Clinical Social Worker  [facility] Care Management [phone]

## 2021-03-22 NOTE — Telephone Encounter (Signed)
Last fill I see was for 90 days 02/22/2021 shouldn't need refills until 05/23/21

## 2021-03-22 NOTE — Patient Instructions (Signed)
Visit Information  Thank you for taking time to visit with me today. Please don't hesitate to contact me if I can be of assistance to you before our next scheduled telephone appointment.  Following are the goals we discussed today:   - call 211 when I need some help - follow-up on any referrals for help I am given - think ahead to make sure my need does not become an emergency - have a back-up plan - make a list of family or friends that I can call  -continue to follow up with status of Medicaid application  Our next appointment is by telephone on 04/12/21 at 12:30pm  Please call the care guide team at [phone] if you need to cancel or reschedule your appointment.   If you are experiencing a Mental Health or Behavioral Health Crisis or need someone to talk to, please call the Suicide and Crisis Lifeline: 988   Following is a copy of your full plan of care:  Care Plan : General Social Work (Adult)  Updates made by [employer], Clent Jacks, LCSW since 03/22/2021 12:00 AM     Problem: CHL AMB "PATIENT-SPECIFIC PROBLEM"   Priority: Medium  Note:   CARE PLAN ENTRY (see longitudinal plan of care for additional care plan information)  Current Barriers:  Patient with  cognitive dysfunction  -history of stroke-in need of assistance with connection to community resources  Knowledge deficits and need for support, education and care coordination related to community resources support  Financial constraints related to current medical condition, Limited social support, Transportation, ADL IADL limitations, Cognitive Deficits, and Lacks knowledge of community resource: Transportation resources  Clinical Goal(s)  Over the next 90 days, patient will follow up with the Department of Social Services regarding the status of his Medicaid application as well as [employer] transportation to follow up on the coordination of transportation needs  Interventions provided by LCSW:  Assessed patient's care  coordination needs related to need for transportation as well as Medicaid coverage and discussed ongoing care management follow up  Patient continues to confirm follow up with a psychiatrist on 05/17/21 Confirmed that Medication has been authorized received(Tizanidine) Confirmed that patient has contacted [employer] and has enrolled for transportation to future medical appointments-transportation resource working out well Followed up on [employer] application-patient contacted by the Department of Social Services-follow up assessment completed Positive reinforcement provided regarding coordination of Transportation to medical appointment through [employer], follow up with psychiatrist and medicaid application follow up  Patient Self Care Activities & Deficits:  Patient is unable to independently navigate community resource options without care coordination support  Acknowledges deficits and is motivated to resolve concern  Patient is able to contact [employer] as discussed today Unable to independently transport self to medical appointments, has difficulty with the functioning of his right side due to stroke Self administers medications as prescribed  Please see past updates related to this goal by clicking on the "Past Updates" button in the selected goal        Mr. Croswell was given information about Care Management services by the embedded care coordination team including:  Care Management services include personalized support from designated clinical staff supervised by his physician, including individualized plan of care and coordination with other care providers 24/7 contact phone numbers for assistance for urgent and routine care needs. The patient may stop CCM services at any time (effective at the end of the month) by phone call to the office staff.  Patient agreed to  services and verbal consent obtained.   The patient verbalized understanding of  instructions, educational materials, and care plan provided today and declined offer to receive copy of patient instructions, educational materials, and care plan.   Telephone follow up appointment with care management team member scheduled for: 04/12/21   Verna Czech, LCSW Clinical Social Worker  [facility] Care Management [phone]

## 2021-03-24 ENCOUNTER — Encounter: Payer: Self-pay | Admitting: Physician Assistant

## 2021-03-26 DIAGNOSIS — Z79899 Other long term (current) drug therapy: Secondary | ICD-10-CM | POA: Diagnosis not present

## 2021-03-26 DIAGNOSIS — I6781 Acute cerebrovascular insufficiency: Secondary | ICD-10-CM | POA: Diagnosis not present

## 2021-03-26 DIAGNOSIS — G894 Chronic pain syndrome: Secondary | ICD-10-CM | POA: Diagnosis not present

## 2021-03-26 DIAGNOSIS — Z9181 History of falling: Secondary | ICD-10-CM | POA: Diagnosis not present

## 2021-03-26 DIAGNOSIS — M21371 Foot drop, right foot: Secondary | ICD-10-CM | POA: Diagnosis not present

## 2021-03-26 DIAGNOSIS — M48061 Spinal stenosis, lumbar region without neurogenic claudication: Secondary | ICD-10-CM | POA: Diagnosis not present

## 2021-03-26 DIAGNOSIS — I69398 Other sequelae of cerebral infarction: Secondary | ICD-10-CM | POA: Diagnosis not present

## 2021-03-26 DIAGNOSIS — F5101 Primary insomnia: Secondary | ICD-10-CM | POA: Diagnosis not present

## 2021-03-27 ENCOUNTER — Ambulatory Visit: Payer: Medicare HMO

## 2021-03-27 ENCOUNTER — Ambulatory Visit: Payer: Self-pay | Admitting: *Deleted

## 2021-03-27 ENCOUNTER — Encounter: Payer: Self-pay | Admitting: *Deleted

## 2021-03-27 ENCOUNTER — Encounter: Payer: Medicare HMO | Admitting: Speech Pathology

## 2021-03-27 DIAGNOSIS — M48061 Spinal stenosis, lumbar region without neurogenic claudication: Secondary | ICD-10-CM | POA: Diagnosis not present

## 2021-03-27 DIAGNOSIS — M5441 Lumbago with sciatica, right side: Secondary | ICD-10-CM

## 2021-03-27 DIAGNOSIS — I6930 Unspecified sequelae of cerebral infarction: Secondary | ICD-10-CM

## 2021-03-27 DIAGNOSIS — F5101 Primary insomnia: Secondary | ICD-10-CM | POA: Diagnosis not present

## 2021-03-27 DIAGNOSIS — G894 Chronic pain syndrome: Secondary | ICD-10-CM | POA: Diagnosis not present

## 2021-03-27 DIAGNOSIS — Z79899 Other long term (current) drug therapy: Secondary | ICD-10-CM | POA: Diagnosis not present

## 2021-03-27 DIAGNOSIS — M21371 Foot drop, right foot: Secondary | ICD-10-CM | POA: Diagnosis not present

## 2021-03-27 DIAGNOSIS — I10 Essential (primary) hypertension: Secondary | ICD-10-CM

## 2021-03-27 DIAGNOSIS — I6781 Acute cerebrovascular insufficiency: Secondary | ICD-10-CM | POA: Diagnosis not present

## 2021-03-27 DIAGNOSIS — G8929 Other chronic pain: Secondary | ICD-10-CM

## 2021-03-27 DIAGNOSIS — Z9181 History of falling: Secondary | ICD-10-CM | POA: Diagnosis not present

## 2021-03-27 DIAGNOSIS — I69398 Other sequelae of cerebral infarction: Secondary | ICD-10-CM | POA: Diagnosis not present

## 2021-03-27 NOTE — Telephone Encounter (Signed)
Sleep study was faxed last week.

## 2021-03-27 NOTE — Patient Instructions (Signed)
Visit Information  Thank you for taking time to visit with me today. Please don't hesitate to contact me if I can be of assistance to you before our next scheduled telephone appointment.  Following are the goals we discussed today:   - call 211 when I need some help - follow-up on any referrals for help I am given - think ahead to make sure my need does not become an emergency - have a back-up plan - make a list of family or friends that I can call  -continue to follow upon status of Medicaid application with the Department of Social Services -continue to utilize [employer] to schedule rides to medical appointments  Our next appointment is by telephone on 12/22 at 12:30pm  Please call the care guide team at [phone] if you need to cancel or reschedule your appointment.   If you are experiencing a Mental Health or Behavioral Health Crisis or need someone to talk to, please call the Suicide and Crisis Lifeline: 988   The patient verbalized understanding of instructions, educational materials, and care plan provided today and declined offer to receive copy of patient instructions, educational materials, and care plan.   Telephone follow up appointment with care management team member scheduled for:04/12/21   Verna Czech, [place] Clinical Social Worker  [facility] Care [facility] [phone]

## 2021-03-27 NOTE — Telephone Encounter (Signed)
This encounter was created in error - please disregard.

## 2021-03-27 NOTE — Chronic Care Management (AMB) (Signed)
Chronic Care Management    Clinical Social Work Note  03/27/2021 Name: David Reeves MRN: [id number] DOB: 08-Jul-1970  David Reeves is a 50 y.o. year old male who is a primary care patient of Alfredia Ferguson, [place]. The CCM team was consulted to assist the patient with chronic disease management and/or care coordination needs related to: [employer] .   Engaged with patient by telephone for follow up visit in response to provider referral for social work chronic care management and care coordination services.   Consent to Services:  The patient was given information about Chronic Care Management services, agreed to services, and gave verbal consent prior to initiation of services.  Please see initial visit note for detailed documentation.   Patient agreed to services and consent obtained.   Assessment: Review of patient past medical history, allergies, medications, and health status, including review of relevant consultants reports was performed today as part of a comprehensive evaluation and provision of chronic care management and care coordination services.     SDOH (Social Determinants of Health) assessments and interventions performed:    Advanced Directives Status: Not addressed in this encounter.  CCM Care Plan  Allergies  Allergen Reactions   Duloxetine     Other reaction(s): Other (See Comments) Increased temperature, sweating, uncontrolled shaking, aggressive thoughts   Amitriptyline Other (See Comments)    Urine retention   Buspirone Other (See Comments)    Kidney pain   Ciprofloxacin Other (See Comments)    hallucinations   Doxepin Other (See Comments)    "Didn't feel right"   Escitalopram Other (See Comments)    Agitation   Olanzapine    Penicillins Other (See Comments)    Unknown -- childhood reaction   Sertraline Other (See Comments)    Urine retention   Trazodone Other (See Comments)    abd pain, urine retention    Outpatient  Encounter Medications as of 03/27/2021  Medication Sig   amLODipine (NORVASC) 5 MG tablet Take 1 tablet (5 mg total) by mouth daily.   aspirin EC 81 MG tablet Take 81 mg by mouth daily. Swallow whole.   atorvastatin (LIPITOR) 80 MG tablet Take 1 tablet (80 mg total) by mouth daily.   Cholecalciferol (VITAMIN D3) 2000 units TABS Take by mouth.   diazepam (VALIUM) 5 MG tablet Take 1 tablet (5 mg total) by mouth every 12 (twelve) hours as needed for muscle spasms.   meloxicam (MOBIC) 15 MG tablet Take 1 tablet (15 mg total) by mouth daily.   Multiple Vitamin (MULTIVITAMIN) tablet Take 1 tablet by mouth daily.   [START ON 06/05/2021] tiZANidine (ZANAFLEX) 4 MG tablet Take 1 tablet (4 mg total) by mouth in the morning, at noon, in the evening, and at bedtime.   zolpidem (AMBIEN) 10 MG tablet Take 1 tablet (10 mg total) by mouth at bedtime.   No facility-administered encounter medications on file as of 03/27/2021.    Patient Active Problem List   Diagnosis Date Noted   Alterations of sensations following cerebrovascular accident 03/08/2021   Aphasia as late effect of cerebrovascular accident (CVA) 03/08/2021   Muscle spasm 02/22/2021   Acute non-recurrent frontal sinusitis 02/22/2021   Chronic pain syndrome 01/23/2021   Primary hypertension 01/23/2021   Nerve pain 01/23/2021   Erectile dysfunction due to diseases classified elsewhere 01/23/2021   Cognitive dysfunction 01/23/2021   Scrotal edema 01/23/2021   Elevated LDL cholesterol level 01/23/2021   History of stroke with residual deficit 01/23/2021   Primary  insomnia 01/23/2021   Mouth pain 01/23/2021   Laceration of right hand without foreign body    Closed compression fracture of L1 vertebra (HCC) 05/24/2016   Lumbar stenosis with neurogenic claudication 11/01/2015   Chronic 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 11/08/2014   Hx of hemorrhoids 11/08/2014   AAA (abdominal aortic aneurysm) (HCC) 09/22/2014   Chronic pain  associated with significant psychosocial dysfunction 09/22/2014   Panic disorder 09/22/2014   AB (asthmatic bronchitis) 08/17/2014   Anxiety 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 08/17/2014   Backache 08/17/2014   Lumbosacral spondylosis without myelopathy 08/17/2014   Disorder of male genital organs 08/17/2014   Brash 08/17/2014   Low back pain 08/17/2014   Tendon nodule 08/17/2014   Episodic paroxysmal anxiety disorder 08/17/2014   Hernia, inguinal, right 08/17/2014   Fast heart beat 08/17/2014   Illness 08/17/2014   Inguinal hernia 10/14/2012    Conditions to be addressed/monitored: Cognitive dysfunction; Limited social support and Transportation;   Care Plan : General Social Work (Adult)  Updates made by [employer], Clent Jacks, LCSW since 03/27/2021 12:00 AM     Problem: CHL AMB "PATIENT-SPECIFIC PROBLEM"   Priority: Medium  Note:   CARE PLAN ENTRY (see longitudinal plan of care for additional care plan information)  Current Barriers:  Patient with  cognitive dysfunction  -history of stroke-in need of assistance with connection to community resources  Knowledge deficits and need for support, education and care coordination related to community resources support  Financial constraints related to current medical condition, Limited social support, Transportation, ADL IADL limitations, Cognitive Deficits, and Lacks knowledge of community resource: [employer], quantity)  Over the next 90 days, patient will follow up with the Department of Social Services regarding the status of his Medicaid application as well as [employer] transportation to follow up on the coordination of transportation needs  Interventions provided by LCSW:  This Child psychotherapist contacted requesting assistance with coordination for transportation to medical appointments Confirmed that patient has contacted [employer] and has enrolled for transportation to future medical  appointments-transportation resource working out well however had difficulty verbally scheduling due to aphasia-patient requesting assistance wit this  Alternatives discussed, with regard to scheduling of transport. Confirmed that significant other was able to call the [facility] transport line-transport has been scheduled Followed up on Medicaid application-patient contacted by the Department of Social Services-application remains pending Positive reinforcement provided regarding coordination of Transportation to medical appointments through [employer]  Patient Self Care Activities & Deficits:  Patient is unable to independently navigate community resource options without care coordination support  Acknowledges deficits and is motivated to resolve concern  Patient is able to contact [employer] as discussed today Unable to independently transport self to medical appointments, has difficulty with the functioning of his right side due to stroke Self administers medications as prescribed  Please see past updates related to this goal by clicking on the "Past Updates" button in the selected goal         Follow Up Plan: SW will follow up with patient by phone over the next 14 business days       [employer], [place] Clinical Social Worker  [facility] Care Management [phone]

## 2021-03-29 ENCOUNTER — Ambulatory Visit: Payer: Medicare HMO

## 2021-03-29 ENCOUNTER — Other Ambulatory Visit: Payer: Self-pay | Admitting: Physician Assistant

## 2021-03-29 ENCOUNTER — Encounter: Payer: Medicare HMO | Admitting: Speech Pathology

## 2021-03-29 DIAGNOSIS — G894 Chronic pain syndrome: Secondary | ICD-10-CM

## 2021-03-29 DIAGNOSIS — I63342 Cerebral infarction due to thrombosis of left cerebellar artery: Secondary | ICD-10-CM | POA: Diagnosis not present

## 2021-03-29 DIAGNOSIS — I69322 Dysarthria following cerebral infarction: Secondary | ICD-10-CM | POA: Diagnosis not present

## 2021-03-29 DIAGNOSIS — R4701 Aphasia: Secondary | ICD-10-CM | POA: Diagnosis not present

## 2021-03-29 NOTE — Telephone Encounter (Signed)
There is a note on the refill that states "No refills, patient is scheduled to see pain management in next few months. Do not fill until after 06/05/2020 pt recently picked up 90 day supply from another pharmacy"  KP

## 2021-04-02 ENCOUNTER — Ambulatory Visit: Payer: Self-pay | Admitting: *Deleted

## 2021-04-02 DIAGNOSIS — I10 Essential (primary) hypertension: Secondary | ICD-10-CM

## 2021-04-02 DIAGNOSIS — H5213 Myopia, bilateral: Secondary | ICD-10-CM | POA: Diagnosis not present

## 2021-04-02 DIAGNOSIS — G8929 Other chronic pain: Secondary | ICD-10-CM

## 2021-04-02 DIAGNOSIS — I6930 Unspecified sequelae of cerebral infarction: Secondary | ICD-10-CM

## 2021-04-02 DIAGNOSIS — M5441 Lumbago with sciatica, right side: Secondary | ICD-10-CM

## 2021-04-02 DIAGNOSIS — Z0100 Encounter for examination of eyes and vision without abnormal findings: Secondary | ICD-10-CM | POA: Diagnosis not present

## 2021-04-02 NOTE — Chronic Care Management (AMB) (Signed)
Chronic Care Management    Clinical Social Work Note  04/02/2021 Name: David Reeves MRN: [id number] DOB: 12/17/1970  David Reeves is a 50 y.o. year old male who is a primary care patient of Alfredia Ferguson, [place]. The CCM team was consulted to assist the patient with chronic disease management and/or care coordination needs related to: [employer] .   Engaged with patient by telephone for  return call for community resource information and follow up  in response to provider referral for social work chronic care management and care coordination services.   Consent to Services:  The patient was given information about Chronic Care Management services, agreed to services, and gave verbal consent prior to initiation of services.  Please see initial visit note for detailed documentation.   Patient agreed to services and consent obtained.   Assessment: Review of patient past medical history, allergies, medications, and health status, including review of relevant consultants reports was performed today as part of a comprehensive evaluation and provision of chronic care management and care coordination services.     SDOH (Social Determinants of Health) assessments and interventions performed:    Advanced Directives Status: Not addressed in this encounter.  CCM Care Plan  Allergies  Allergen Reactions   Duloxetine     Other reaction(s): Other (See Comments) Increased temperature, sweating, uncontrolled shaking, aggressive thoughts   Amitriptyline Other (See Comments)    Urine retention   Buspirone Other (See Comments)    Kidney pain   Ciprofloxacin Other (See Comments)    hallucinations   Doxepin Other (See Comments)    "Didn't feel right"   Escitalopram Other (See Comments)    Agitation   Olanzapine    Penicillins Other (See Comments)    Unknown -- childhood reaction   Sertraline Other (See Comments)    Urine retention   Trazodone Other (See Comments)     abd pain, urine retention    Outpatient Encounter Medications as of 04/02/2021  Medication Sig   amLODipine (NORVASC) 5 MG tablet Take 1 tablet (5 mg total) by mouth daily.   aspirin EC 81 MG tablet Take 81 mg by mouth daily. Swallow whole.   atorvastatin (LIPITOR) 80 MG tablet Take 1 tablet (80 mg total) by mouth daily.   Cholecalciferol (VITAMIN D3) 2000 units TABS Take by mouth.   diazepam (VALIUM) 5 MG tablet Take 1 tablet (5 mg total) by mouth every 12 (twelve) hours as needed for muscle spasms.   meloxicam (MOBIC) 15 MG tablet Take 1 tablet (15 mg total) by mouth daily.   Multiple Vitamin (MULTIVITAMIN) tablet Take 1 tablet by mouth daily.   [START ON 06/05/2021] tiZANidine (ZANAFLEX) 4 MG tablet Take 1 tablet (4 mg total) by mouth in the morning, at noon, in the evening, and at bedtime.   zolpidem (AMBIEN) 10 MG tablet Take 1 tablet (10 mg total) by mouth at bedtime.   No facility-administered encounter medications on file as of 04/02/2021.    Patient Active Problem List   Diagnosis Date Noted   Alterations of sensations following cerebrovascular accident 03/08/2021   Aphasia as late effect of cerebrovascular accident (CVA) 03/08/2021   Muscle spasm 02/22/2021   Acute non-recurrent frontal sinusitis 02/22/2021   Chronic pain syndrome 01/23/2021   Primary hypertension 01/23/2021   Nerve pain 01/23/2021   Erectile dysfunction due to diseases classified elsewhere 01/23/2021   Cognitive dysfunction 01/23/2021   Scrotal edema 01/23/2021   Elevated LDL cholesterol level 01/23/2021   History of  stroke with residual deficit 01/23/2021   Primary insomnia 01/23/2021   Mouth pain 01/23/2021   Laceration of right hand without foreign body    Closed compression fracture of L1 vertebra (HCC) 05/24/2016   Lumbar stenosis with neurogenic claudication 11/01/2015   Chronic 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 11/08/2014   Hx of hemorrhoids 11/08/2014   AAA (abdominal aortic  aneurysm) (HCC) 09/22/2014   Chronic pain associated with significant psychosocial dysfunction 09/22/2014   Panic disorder 09/22/2014   AB (asthmatic bronchitis) 08/17/2014   Anxiety 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 08/17/2014   Backache 08/17/2014   Lumbosacral spondylosis without myelopathy 08/17/2014   Disorder of male genital organs 08/17/2014   Brash 08/17/2014   Low back pain 08/17/2014   Tendon nodule 08/17/2014   Episodic paroxysmal anxiety disorder 08/17/2014   Hernia, inguinal, right 08/17/2014   Fast heart beat 08/17/2014   Illness 08/17/2014   Inguinal hernia 10/14/2012    Conditions to be addressed/monitored: ;  Cognitive dysfunction; Limited social support and Transportation;  Care Plan : General Social Work (Adult)  Updates made by [employer], Clent Jacks, LCSW since 04/02/2021 12:00 AM     Problem: CHL AMB "PATIENT-SPECIFIC PROBLEM"   Priority: Medium  Note:   CARE PLAN ENTRY (see longitudinal plan of care for additional care plan information)  Current Barriers:  Patient with  cognitive dysfunction  -history of stroke-in need of assistance with connection to community resources  Knowledge deficits and need for support, education and care coordination related to community resources support  Financial constraints related to current medical condition, Limited social support, Transportation, ADL IADL limitations, Cognitive Deficits, and Lacks knowledge of community resource: [employer], quantity)  Over the next 90 days, patient will follow up with the Department of Social Services regarding the status of his Medicaid application as well as [employer] transportation to follow up on the coordination of transportation needs  Interventions provided by LCSW: Neuro pt , financial assistance This Child psychotherapist contacted requesting assistance with coordination for transportation to medical appointments Confirmed that patient has contacted [employer] and has enrolled for transportation to future medical appointments-transportation resource working out well however had difficulty verbally scheduling due to aphasia-patient requesting assistance wit this  Alternatives discussed, with regard to scheduling of transport. Confirmed that significant other was able to call the [facility] transport line-transport has been scheduled Followed up on Medicaid application-patient contacted by the Department of Social Services-application remains pending Positive reinforcement provided regarding coordination of Transportation to medical appointments through [facility] 04/02/21 CSW contacted by patient to provide resources to assist with medical bills-contact information provide for [facility] Business office provided for financial assistance [phone] application pending Referral for Psychiatry discussed, patient confirmed scheduled  appointment with Neuro OT and PT-recommendation for follow up with a psychiatrist discussed-appointment pending(CSW to follow up with referral for psychiatry)  Patient Self Care Activities & Deficits:  Patient is unable to independently navigate community resource options without care coordination support  Acknowledges deficits and is motivated to resolve concern  Patient is able to contact [employer] as discussed today Unable to independently transport self to medical appointments, has difficulty with the functioning of his right side due to stroke Self administers medications as prescribed  Please see past updates related to this goal by clicking on the "Past Updates" button in the selected goal         Follow Up Plan: SW will follow up with patient by phone over the  next 14 business days       [employer], [place] Clinical Social Worker  [facility] [phone]

## 2021-04-02 NOTE — Patient Instructions (Signed)
Visit Information  Thank you for taking time to visit with me today. Please don't hesitate to contact me if I can be of assistance to you before our next scheduled telephone appointment.  Following are the goals we discussed today:   - call 211 when I need some help - follow-up on any referrals for help I am given - think ahead to make sure my need does not become an emergency - have a back-up plan - make a list of family or friends that I can call  -continue to follow upon status of Medicaid application with the Department of Social Services -continue to utilize [employer] to schedule rides to medical appointment -contact  business office for financial assistance for medical bills [phone]    Our next appointment is by telephone on 04/12/21 at 12:30pm  Please call the care guide team at [phone] if you need to cancel or reschedule your appointment.   If you are experiencing a Mental Health or Behavioral Health Crisis or need someone to talk to, please call the Suicide and Crisis Lifeline: 988   The patient verbalized understanding of instructions, educational materials, and care plan provided today and declined offer to receive copy of patient instructions, educational materials, and care plan.   Telephone follow up appointment with care management team member scheduled for: 04/12/21   Verna Czech, LCSW Clinical Social Worker  [facility] Care [facility] [phone]

## 2021-04-03 ENCOUNTER — Other Ambulatory Visit: Payer: Self-pay | Admitting: Physician Assistant

## 2021-04-03 ENCOUNTER — Ambulatory Visit: Payer: Medicare HMO

## 2021-04-03 ENCOUNTER — Encounter: Payer: Medicare HMO | Admitting: Speech Pathology

## 2021-04-03 DIAGNOSIS — S32010S Wedge compression fracture of first lumbar vertebra, sequela: Secondary | ICD-10-CM

## 2021-04-03 DIAGNOSIS — G8929 Other chronic pain: Secondary | ICD-10-CM

## 2021-04-03 DIAGNOSIS — M47817 Spondylosis without myelopathy or radiculopathy, lumbosacral region: Secondary | ICD-10-CM

## 2021-04-04 ENCOUNTER — Other Ambulatory Visit: Payer: Self-pay

## 2021-04-04 ENCOUNTER — Emergency Department
Admission: EM | Admit: 2021-04-04 | Discharge: 2021-04-05 | Disposition: A | Discharge status: Home / Self Care | Payer: Medicare HMO | Attending: Emergency Medicine | Admitting: Emergency Medicine

## 2021-04-04 DIAGNOSIS — M549 Dorsalgia, unspecified: Secondary | ICD-10-CM | POA: Diagnosis not present

## 2021-04-04 DIAGNOSIS — M62838 Other muscle spasm: Secondary | ICD-10-CM | POA: Diagnosis not present

## 2021-04-04 DIAGNOSIS — M545 Low back pain, unspecified: Secondary | ICD-10-CM | POA: Insufficient documentation

## 2021-04-04 DIAGNOSIS — Z79899 Other long term (current) drug therapy: Secondary | ICD-10-CM | POA: Diagnosis not present

## 2021-04-04 DIAGNOSIS — R1011 Right upper quadrant pain: Secondary | ICD-10-CM

## 2021-04-04 DIAGNOSIS — R52 Pain, unspecified: Secondary | ICD-10-CM | POA: Diagnosis not present

## 2021-04-04 DIAGNOSIS — R109 Unspecified abdominal pain: Secondary | ICD-10-CM | POA: Insufficient documentation

## 2021-04-04 DIAGNOSIS — I10 Essential (primary) hypertension: Secondary | ICD-10-CM | POA: Insufficient documentation

## 2021-04-04 DIAGNOSIS — Z7982 Long term (current) use of aspirin: Secondary | ICD-10-CM | POA: Insufficient documentation

## 2021-04-04 DIAGNOSIS — U071 COVID-19: Secondary | ICD-10-CM | POA: Insufficient documentation

## 2021-04-04 DIAGNOSIS — M62830 Muscle spasm of back: Secondary | ICD-10-CM | POA: Diagnosis not present

## 2021-04-04 DIAGNOSIS — K449 Diaphragmatic hernia without obstruction or gangrene: Secondary | ICD-10-CM | POA: Diagnosis not present

## 2021-04-04 DIAGNOSIS — N281 Cyst of kidney, acquired: Secondary | ICD-10-CM | POA: Diagnosis not present

## 2021-04-04 DIAGNOSIS — K760 Fatty (change of) liver, not elsewhere classified: Secondary | ICD-10-CM | POA: Diagnosis not present

## 2021-04-04 DIAGNOSIS — S22080A Wedge compression fracture of T11-T12 vertebra, initial encounter for closed fracture: Secondary | ICD-10-CM | POA: Diagnosis not present

## 2021-04-04 DIAGNOSIS — N433 Hydrocele, unspecified: Secondary | ICD-10-CM | POA: Diagnosis not present

## 2021-04-04 MED ORDER — OXYCODONE-ACETAMINOPHEN 7.5-325 MG PO TABS: 1.0000 | ORAL_TABLET | Freq: Four times a day (QID) | ORAL | 0 refills | Status: DC | PRN

## 2021-04-04 MED ORDER — HYDROMORPHONE HCL 1 MG/ML IJ SOLN
0.5000 mg | Freq: Once | INTRAMUSCULAR | Status: AC
  Administered 2021-04-04: 21:00:00 0.5 mg via INTRAVENOUS
  Filled 2021-04-04: qty 1

## 2021-04-04 MED ORDER — ATORVASTATIN CALCIUM 20 MG PO TABS: 80.0000 mg | ORAL_TABLET | Freq: Every day | ORAL | Status: DC

## 2021-04-04 MED ORDER — ADULT MULTIVITAMIN W/MINERALS CH
1.0000 | ORAL_TABLET | Freq: Every day | ORAL | Status: DC
  Administered 2021-04-05: 1 via ORAL
  Filled 2021-04-04: qty 1

## 2021-04-04 MED ORDER — VITAMIN D3 25 MCG (1000 UNIT) PO TABS
2000.0000 [IU]/d | ORAL_TABLET | Freq: Every day | ORAL | Status: DC
Start: 2021-04-05 — End: 2021-04-05
  Administered 2021-04-05: 2000 [IU] via ORAL
  Filled 2021-04-04: qty 2

## 2021-04-04 MED ORDER — AMLODIPINE BESYLATE 5 MG PO TABS: 5.0000 mg | ORAL_TABLET | Freq: Every day | ORAL | Status: DC

## 2021-04-04 MED ORDER — MELOXICAM 7.5 MG PO TABS
15.0000 mg | ORAL_TABLET | Freq: Every day | ORAL | Status: DC
  Filled 2021-04-04: qty 2

## 2021-04-04 MED ORDER — DIAZEPAM 5 MG PO TABS: 5.0000 mg | ORAL_TABLET | Freq: Three times a day (TID) | ORAL | Status: DC

## 2021-04-04 MED ORDER — TIZANIDINE HCL 2 MG PO TABS
4.0000 mg | ORAL_TABLET | Freq: Three times a day (TID) | ORAL | Status: DC | PRN
  Administered 2021-04-05: 4 mg via ORAL
  Filled 2021-04-04: qty 2

## 2021-04-04 MED ORDER — LORAZEPAM 2 MG/ML IJ SOLN
2.0000 mg | Freq: Once | INTRAMUSCULAR | Status: AC
  Administered 2021-04-04: 22:00:00 2 mg via INTRAVENOUS
  Filled 2021-04-04: qty 1

## 2021-04-04 MED ORDER — HYDROMORPHONE HCL 1 MG/ML IJ SOLN
1.0000 mg | Freq: Once | INTRAMUSCULAR | Status: AC
  Administered 2021-04-04: 22:00:00 1 mg via INTRAVENOUS
  Filled 2021-04-04: qty 1

## 2021-04-04 MED ORDER — ZOLPIDEM TARTRATE 5 MG PO TABS
10.0000 mg | ORAL_TABLET | Freq: Every day | ORAL | Status: DC
  Administered 2021-04-05: 10 mg via ORAL
  Filled 2021-04-04: qty 2

## 2021-04-04 MED ORDER — ASPIRIN EC 81 MG PO TBEC: 81.0000 mg | DELAYED_RELEASE_TABLET | Freq: Every day | ORAL | Status: DC

## 2021-04-04 MED ORDER — LORAZEPAM 2 MG/ML IJ SOLN
1.0000 mg | Freq: Once | INTRAMUSCULAR | Status: AC
  Administered 2021-04-04: 21:00:00 1 mg via INTRAVENOUS
  Filled 2021-04-04: qty 1

## 2021-04-04 MED ORDER — HYDROMORPHONE HCL 1 MG/ML IJ SOLN
0.5000 mg | Freq: Once | INTRAMUSCULAR | Status: AC
  Administered 2021-04-04: 20:00:00 0.5 mg via INTRAVENOUS
  Filled 2021-04-04: qty 1

## 2021-04-04 NOTE — ED Notes (Signed)
Pt ambulated independently to restroom & back to room.

## 2021-04-04 NOTE — ED Notes (Signed)
Pt c/o back pain x19 years. Pt also c/o pain in the shoulder blades, he thinks he may have injured during therapy.

## 2021-04-04 NOTE — ED Provider Notes (Signed)
[facility] Emergency Department Provider Note  ____________________________________________   Event Date/Time   First MD Initiated Contact with Patient 04/04/21 1926     (approximate)  I have reviewed the triage vital signs and the nursing notes.   HISTORY  Chief Complaint Back Pain    HPI David Reeves is a 50 y.o. male with a history of stroke and some aphasia.  He comes in complaining of low back pain and muscle spasms under his shoulder blade.  These are the same problems he has had for quite some time and they just have flared up today after physical therapy he thinks.  There is nothing new about them he has no new numbness or weakness anywhere.  He has no chest pain or shortness of breath or nausea or vomiting or any other complaints.  He is not coughing or running a fever.  He just wants something done about the pain.  He has an appointment in the pain clinic in about a month.  Patient has had Tylenol for the pain and Valium for the muscle spasm this morning.      Past Medical History:  Diagnosis Date   Back pain 04/22/2012   Bone spur    Bulging disc 04/22/2012   Degenerative disc disease    Osteoarthritis    Panic anxiety syndrome    Stroke (HCC) 10/2020   Taking multiple medications for chronic disease     Patient Active Problem List   Diagnosis Date Noted   Alterations of sensations following cerebrovascular accident 03/08/2021   Aphasia as late effect of cerebrovascular accident (CVA) 03/08/2021   Muscle spasm 02/22/2021   Acute non-recurrent frontal sinusitis 02/22/2021   Chronic pain syndrome 01/23/2021   Primary hypertension 01/23/2021   Nerve pain 01/23/2021   Erectile dysfunction due to diseases classified elsewhere 01/23/2021   Cognitive dysfunction 01/23/2021   Scrotal edema 01/23/2021   Elevated LDL cholesterol level 01/23/2021   History of stroke with residual deficit 01/23/2021   Primary insomnia 01/23/2021    Mouth pain 01/23/2021   Laceration of right hand without foreign body    Closed compression fracture of L1 vertebra (HCC) 05/24/2016   Lumbar stenosis with neurogenic claudication 11/01/2015   Chronic 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 11/08/2014   Hx of hemorrhoids 11/08/2014   AAA (abdominal aortic aneurysm) (HCC) 09/22/2014   Chronic pain associated with significant psychosocial dysfunction 09/22/2014   Panic disorder 09/22/2014   AB (asthmatic bronchitis) 08/17/2014   Anxiety 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 08/17/2014   Backache 08/17/2014   Lumbosacral spondylosis without myelopathy 08/17/2014   Disorder of male genital organs 08/17/2014   Brash 08/17/2014   Low back pain 08/17/2014   Tendon nodule 08/17/2014   Episodic paroxysmal anxiety disorder 08/17/2014   Hernia, inguinal, right 08/17/2014   Fast heart beat 08/17/2014   Illness 08/17/2014   Inguinal hernia 10/14/2012    Past Surgical History:  Procedure Laterality Date   CYSTECTOMY     head   HERNIA REPAIR Left [phone]   [facility]   TOE SURGERY Right 2007    Prior to Admission medications   Medication Sig Start Date End Date Taking? Authorizing Provider  oxyCODONE-acetaminophen (PERCOCET) 7.5-325 MG tablet Take 1 tablet by mouth every 6 (six) hours as needed for severe pain. 04/04/21 04/04/22 Yes Arnaldo Natal, MD  amLODipine (NORVASC) 5 MG tablet Take 1 tablet (5 mg total) by mouth daily. 02/09/21   Jacky Kindle, FNP  aspirin EC 81 MG tablet Take 81 mg by mouth daily. Swallow whole.    [provider]  atorvastatin (LIPITOR) 80 MG tablet Take 1 tablet (80 mg total) by mouth daily. 02/09/21   Jacky Kindle, FNP  Cholecalciferol (VITAMIN D3) 2000 units TABS Take by mouth.    [provider]  diazepam (VALIUM) 5 MG tablet TAKE 1 TABLET (5 MG TOTAL) BY MOUTH EVERY 12 HOURS AS NEEDED FOR MUSCLE SPASM 04/06/21   Drubel, Lillia Abed, PA-C  meloxicam (MOBIC) 15 MG tablet Take 1  tablet (15 mg total) by mouth daily. 02/09/21   Jacky Kindle, FNP  Multiple Vitamin (MULTIVITAMIN) tablet Take 1 tablet by mouth daily.    [provider]  tiZANidine (ZANAFLEX) 4 MG tablet Take 1 tablet (4 mg total) by mouth in the morning, at noon, in the evening, and at bedtime. 06/05/21   Alfredia Ferguson, PA-C  zolpidem (AMBIEN) 10 MG tablet Take 1 tablet (10 mg total) by mouth at bedtime. 02/22/21 05/23/21  Merita Norton T, FNP    Allergies Duloxetine, Amitriptyline, Buspirone, Ciprofloxacin, Doxepin, Escitalopram, Olanzapine, Penicillins, Sertraline, and Trazodone  Family History  Problem Relation Age of Onset   Diabetes Mother    Diabetes Maternal Uncle    Diabetes Maternal Grandmother    Heart disease Maternal Grandmother     Social History Social History   Tobacco Use   Smoking status: Never   Smokeless tobacco: Never   Tobacco comments:    cbd    Vaping Use   Vaping Use: Never used  Substance Use Topics   Alcohol use: Not Currently    Comment: last used 12-22-17   Drug use: Not Currently    Types: Marijuana    Comment: October 14, 2017    Review of Systems  Constitutional: No fever/chills Eyes: No visual changes. ENT: No sore throat. Cardiovascular: Denies chest pain.  Patient's pain is behind the right shoulder blade shoulder Respiratory: Denies shortness of breath. Gastrointestinal: No abdominal pain.  No nausea, no vomiting.  No diarrhea.  No constipation. Genitourinary: Negative for dysuria. Musculoskeletal: back pain. Skin: Negative for rash. Neurological: Negative for headaches, new focal weakness or numbness.  ____________________________________________   PHYSICAL EXAM:  VITAL SIGNS: ED Triage Vitals  Enc Vitals Group     BP 04/04/21 1636 (!) 163/78     Pulse Rate 04/04/21 1636 70     Resp 04/04/21 1636 20     Temp 04/04/21 1636 98.3 F (36.8 C)     Temp Source 04/04/21 1636 Oral     SpO2 04/04/21 1636 100 %     Weight --      Height  --      Head Circumference --      Peak Flow --      Pain Score 04/04/21 1635 8     Pain Loc --      Pain Edu? --      Excl. in GC? --     Constitutional: Alert and oriented. Well appearing and in no acute distress. Eyes: Conjunctivae are normal.  Head: Atraumatic. Nose: No congestion/rhinnorhea. Mouth/Throat: Mucous membranes are moist.  Oropharynx non-erythematous. Neck: No stridor Cardiovascular:  Good peripheral circulation. Respiratory: Normal respiratory effort.  No retractions.  Gastrointestinal: Soft and nontender. No distention. No abdominal bruits.  Musculoskeletal: No lower extremity tenderness nor edema.  Palpation of the patient's L-spine reproduces his pain.  Again he reports this is his chronic pain which is worse today.  He is  not injured himself that he knows about except for straining during physical therapy.  Manipulation of the patient's right arm increases the pain under his scapula. Neurologic: At baseline no new changes.  Patient Skin:  Skin is warm, dry and intact. No rash noted.   ____________________________________________   LABS (all labs ordered are listed, but only abnormal results are displayed)  Labs Reviewed - No data to display ____________________________________________  EKG   ____________________________________________  RADIOLOGY Jill Poling, personally viewed and evaluated these images (plain radiographs) as part of my medical decision making, as well as reviewing the written report by the radiologist.  ED MD interpretation:    Official radiology report(s): No results found.  ____________________________________________   PROCEDURES  Procedure(s) performed (including Critical Care):  Procedures   ____________________________________________   INITIAL IMPRESSION / ASSESSMENT AND PLAN / ED COURSE  Patient with no coughing or shortness of breath or other symptoms.  I will try and relieve his pain with some IV  Dilaudid and Ativan.  Possibly will think of increasing his Valium to 3 times a day for the muscle spasm.     ----------------------------------------- 8:49 PM on 04/04/2021 ----------------------------------------- Patient with no results the first round of medications.  We will wait just a couple minutes and give him a second round.   ----------------------------------------- 10:07 PM on 04/04/2021 ----------------------------------------- Patient has gotten to go to the bathroom by himself.  He appears to look a little bit better but says the pain is not any better says it is 4 out of 10 although he had told the nurse earlier it was 10 out of 10.  I will give him a little bit of Dilaudid and then try to let him go with some Percocet and an increased dose of his Valium from twice daily to 3 times daily for couple days.   ----------------------------------------- 10:34 PM on 04/04/2021 ----------------------------------------- Patient says he can go home by himself.  He cannot take care of himself.  He asked to go to assisted living.  We will try to set this up for him in the morning.   ____________________________________________   FINAL CLINICAL IMPRESSION(S) / ED DIAGNOSES  Final diagnoses:  Midline low back pain without sciatica, unspecified chronicity  Muscle spasm of right shoulder     ED Discharge Orders          Ordered    oxyCODONE-acetaminophen (PERCOCET) 7.5-325 MG tablet  Every 6 hours PRN        04/04/21 2205             Note:  This document was prepared using Dragon voice recognition software and may include unintentional dictation errors.    Arnaldo Natal, MD 04/04/21 2234

## 2021-04-04 NOTE — ED Notes (Signed)
Pt walked to bathroom by self, 67ft distance

## 2021-04-04 NOTE — Discharge Instructions (Addendum)
Try the Percocet 1 pill 4 times a day for pain and try to increase the Valium to 1 pill 3 times a day for the muscle cramps.  Try this for 2 to 3 days and then return to your regular dosage.  Return for increasing pain fever or any other problems.  Try to follow-up with your regular doctor or see the orthopedic surgeon, Dr. Okey Dupre.  Give them a call and let them know you are in the emergency room with a lot of pain and they may be able to see you and do something else for you.  For example Dr. Okey Dupre the orthopedic surgeon would be the one to evaluate you for the brace you were asking about.  He could then refer you to have it made for yourself.

## 2021-04-04 NOTE — ED Triage Notes (Signed)
Pt comes via EMs from home with c/o back pain. Pt states mid lower back for 19 years. Pt states injury from years ago.  Pt does have aphasia and has book to help communicate.   Pt states shoulder blade pain.

## 2021-04-04 NOTE — ED Notes (Signed)
Dr. Darnelle Catalan notified by this RN that pt. Does not want to go home, and that he would like his home [place], and that he would like to speak to Dr. Darnelle Catalan if he is up for discharge.

## 2021-04-05 ENCOUNTER — Ambulatory Visit: Payer: Medicare HMO

## 2021-04-05 ENCOUNTER — Emergency Department: Payer: Medicare HMO

## 2021-04-05 ENCOUNTER — Encounter: Payer: Medicare HMO | Admitting: Speech Pathology

## 2021-04-05 ENCOUNTER — Other Ambulatory Visit: Payer: Self-pay | Admitting: Family Medicine

## 2021-04-05 DIAGNOSIS — K760 Fatty (change of) liver, not elsewhere classified: Secondary | ICD-10-CM | POA: Diagnosis not present

## 2021-04-05 DIAGNOSIS — U071 COVID-19: Secondary | ICD-10-CM | POA: Diagnosis not present

## 2021-04-05 DIAGNOSIS — N433 Hydrocele, unspecified: Secondary | ICD-10-CM | POA: Diagnosis not present

## 2021-04-05 DIAGNOSIS — N281 Cyst of kidney, acquired: Secondary | ICD-10-CM | POA: Diagnosis not present

## 2021-04-05 DIAGNOSIS — S22080A Wedge compression fracture of T11-T12 vertebra, initial encounter for closed fracture: Secondary | ICD-10-CM | POA: Diagnosis not present

## 2021-04-05 DIAGNOSIS — K449 Diaphragmatic hernia without obstruction or gangrene: Secondary | ICD-10-CM | POA: Diagnosis not present

## 2021-04-05 DIAGNOSIS — M62830 Muscle spasm of back: Secondary | ICD-10-CM | POA: Diagnosis not present

## 2021-04-05 DIAGNOSIS — M545 Low back pain, unspecified: Secondary | ICD-10-CM | POA: Diagnosis not present

## 2021-04-05 DIAGNOSIS — M62838 Other muscle spasm: Secondary | ICD-10-CM

## 2021-04-05 LAB — COMPREHENSIVE METABOLIC PANEL
ALT: 42 U/L (ref 0–44)
AST: 34 U/L (ref 15–41)
Albumin: 4.4 g/dL (ref 3.5–5.0)
Alkaline Phosphatase: 51 U/L (ref 38–126)
Anion gap: 7 (ref 5–15)
BUN: 9 mg/dL (ref 6–20)
CO2: 25 mmol/L (ref 22–32)
Calcium: 9.2 mg/dL (ref 8.9–10.3)
Chloride: 105 mmol/L (ref 98–111)
Creatinine, Ser: 0.68 mg/dL (ref 0.61–1.24)
GFR, Estimated: >60 mL/min (ref >60)
Glucose, Bld: 101 mg/dL — ABNORMAL HIGH (ref 70–99)
Potassium: 3.7 mmol/L (ref 3.5–5.1)
Sodium: 137 mmol/L (ref 135–145)
Total Bilirubin: 0.9 mg/dL (ref 0.3–1.2)
Total Protein: 7.6 g/dL (ref 6.5–8.1)

## 2021-04-05 LAB — RESP PANEL BY RT-PCR (FLU A&B, COVID) ARPGX2
Influenza A by PCR: NEGATIVE
Influenza B by PCR: NEGATIVE
SARS Coronavirus 2 by RT PCR: POSITIVE — AB

## 2021-04-05 LAB — CBC
HCT: 42.6 % (ref 39.0–52.0)
Hemoglobin: 14.9 g/dL (ref 13.0–17.0)
MCH: 32.7 pg (ref 26.0–34.0)
MCHC: 35 g/dL (ref 30.0–36.0)
MCV: 93.6 fL (ref 80.0–100.0)
Platelets: 337 10*3/uL (ref 150–400)
RBC: 4.55 MIL/uL (ref 4.22–5.81)
RDW: 12.1 % (ref 11.5–15.5)
WBC: 6.3 10*3/uL (ref 4.0–10.5)
nRBC: 0 % (ref 0.0–0.2)

## 2021-04-05 MED ORDER — FAMOTIDINE 20 MG PO TABS
20.0000 mg | ORAL_TABLET | ORAL | Status: DC | PRN
  Administered 2021-04-05: 20 mg via ORAL
  Filled 2021-04-05: qty 1

## 2021-04-05 MED ORDER — TIZANIDINE HCL 2 MG PO TABS: 4.0000 mg | ORAL_TABLET | Freq: Three times a day (TID) | ORAL | Status: DC | PRN

## 2021-04-05 MED ORDER — OXYCODONE-ACETAMINOPHEN 5-325 MG PO TABS
1.0000 | ORAL_TABLET | Freq: Once | ORAL | Status: AC
  Administered 2021-04-05: 1 via ORAL
  Filled 2021-04-05: qty 1

## 2021-04-05 MED ORDER — ASPIRIN EC 81 MG PO TBEC
81.0000 mg | DELAYED_RELEASE_TABLET | Freq: Every day | ORAL | Status: DC
  Administered 2021-04-05: 81 mg via ORAL
  Filled 2021-04-05: qty 1

## 2021-04-05 MED ORDER — MELOXICAM 7.5 MG PO TABS
15.0000 mg | ORAL_TABLET | Freq: Every day | ORAL | Status: DC
  Administered 2021-04-05: 15 mg via ORAL
  Filled 2021-04-05 (×3): qty 2

## 2021-04-05 MED ORDER — AMLODIPINE BESYLATE 5 MG PO TABS
5.0000 mg | ORAL_TABLET | Freq: Every day | ORAL | Status: DC
  Administered 2021-04-05: 5 mg via ORAL
  Filled 2021-04-05: qty 1

## 2021-04-05 MED ORDER — DIAZEPAM 5 MG PO TABS
5.0000 mg | ORAL_TABLET | Freq: Three times a day (TID) | ORAL | Status: DC
  Administered 2021-04-05: 5 mg via ORAL
  Filled 2021-04-05: qty 1

## 2021-04-05 MED ORDER — GABAPENTIN 300 MG PO CAPS
300.0000 mg | ORAL_CAPSULE | Freq: Every day | ORAL | Status: DC
  Administered 2021-04-05: 300 mg via ORAL
  Filled 2021-04-05: qty 1

## 2021-04-05 MED ORDER — ATORVASTATIN CALCIUM 20 MG PO TABS
80.0000 mg | ORAL_TABLET | Freq: Every day | ORAL | Status: DC
  Administered 2021-04-05: 80 mg via ORAL
  Filled 2021-04-05: qty 4

## 2021-04-05 MED FILL — Tizanidine HCl Tab 2 MG (Base Equivalent): ORAL | Qty: 2 | Status: AC

## 2021-04-05 NOTE — ED Provider Notes (Addendum)
Received care of patient this morning pending placement.  Patient complaining of acute worsening of his chronic back pain mild flank pain.  Blood work and imaging was ordered to evaluate as he has a remote ported history of AAA.  CT does not show AAA with possible inflammation of the gallbladder for which right upper quadrant ultrasound was ordered which shows gallstones but no sign of acute cholecystitis he is pain-free right now with no white count and no biliary obstruction.  Think this is appropriate for outpatient follow-up.  He is COVID-positive and states that his symptoms started more than a week ago when he noticed that he lost smell and taste but he denies any chest pain or pressure no shortness of breath.  He is moving about in the bed.  States that he is having trouble caring for himself at home.  On my initial evaluation of him I have a lower suspicion that he would qualify for skilled nursing but may be a candidate for home health.   Willy Eddy, MD 04/05/21 1138    Willy Eddy, MD 04/05/21 [phone]

## 2021-04-05 NOTE — Evaluation (Signed)
Physical Therapy Evaluation Patient Details Name: David Reeves MRN: [id number] DOB: [birth year] Today's Date: 04/05/2021  History of Present Illness  Pt is admitted for covid with reports of inability to care for self at home. History includes previous CVA in July 2022 with R side weakness. History of aphasia.  Clinical Impression  Pt is a pleasant 50 year old male who was admitted for covid. Pt performs transfers with independence and ambulation with cga and with HHA. Pt demonstrates deficits with chronic R side deficits, balance, and mobility. Would benefit from skilled PT to address above deficits and promote optimal return to PLOF. Per patient, he is having difficulty with performing ADLs and wants more assistance at home, interested in ALF. Currently recommend OP PT for further follow up.     Recommendations for follow up therapy are one component of a multi-disciplinary discharge planning process, led by the attending physician.  Recommendations may be updated based on patient status, additional functional criteria and insurance authorization.  Follow Up Recommendations Outpatient PT    Assistance Recommended at Discharge Intermittent Supervision/Assistance  Functional Status Assessment Patient has had a recent decline in their functional status and demonstrates the ability to make significant improvements in function in a reasonable and predictable amount of time.  Engineer, technical sales    Recommendations for Other Services       Precautions / Restrictions Precautions Precautions: Fall Restrictions Weight Bearing Restrictions: No      Mobility  Bed Mobility               General bed mobility comments: not performed as received seated at EOB    Transfers Overall transfer level: Independent Equipment used: None               General transfer comment: upright posture, decreased weight shift towards R side    Ambulation/Gait Ambulation/Gait  assistance: Min guard Gait Distance (Feet): 40 Feet Assistive device: 1 person hand held assist Gait Pattern/deviations: Step-to pattern       General Gait Details: attempts without AD and reaches out for furniture. Pt with improved balance with HHA. Encouraged to continue ambulation at home with use of SPC.  Stairs            Wheelchair Mobility    Modified Rankin (Stroke Patients Only)       Balance Overall balance assessment: Mild deficits observed, not formally tested                                           Pertinent Vitals/Pain Pain Assessment: 0-10 Pain Score: 8  Pain Location: back Pain Descriptors / Indicators: Aching Pain Intervention(s): Limited activity within patient's tolerance;Premedicated before session    Home Living Family/patient expects to be discharged to:: Private residence Living Arrangements: Alone   Type of Home: House Home Access: Ramped entrance       Home Layout: One level Home Equipment: None      Prior Function Prior Level of Function : Independent/Modified Independent;Driving             Mobility Comments: Doesn't use any AD for mobility, reports no falls. ADLs Comments: is having more difficulty performing ADLs     Hand Dominance   Dominant Hand: Right    Extremity/Trunk Assessment   Upper Extremity Assessment Upper Extremity Assessment: Generalized weakness (R UE grossly 4/5; L UE 5/5)  Lower Extremity Assessment Lower Extremity Assessment: Generalized weakness (B LE grossly 4+/5)       Communication   Communication: Expressive difficulties  Cognition Arousal/Alertness: Awake/alert Behavior During Therapy: WFL for tasks assessed/performed Overall Cognitive Status: Within Functional Limits for tasks assessed                                          General Comments      Exercises     Assessment/Plan    PT Assessment All further PT needs can be met in the next  venue of care  PT Problem List Decreased strength;Decreased balance;Decreased mobility;Pain       PT Treatment Interventions      PT Goals (Current goals can be found in the Care Plan section)  Acute Rehab PT Goals Patient Stated Goal: to get into ALF PT Goal Formulation: With patient Time For Goal Achievement: 04/19/21 Potential to Achieve Goals: Good    Frequency     Barriers to discharge        Co-evaluation               AM-PAC PT "6 Clicks" Mobility  Outcome Measure Help needed turning from your back to your side while in a flat bed without using bedrails?: None Help needed moving from lying on your back to sitting on the side of a flat bed without using bedrails?: None Help needed moving to and from a bed to a chair (including a wheelchair)?: None Help needed standing up from a chair using your arms (e.g., wheelchair or bedside chair)?: None Help needed to walk in hospital room?: A Little Help needed climbing 3-5 steps with a railing? : A Little 6 Click Score: 22    End of Session Equipment Utilized During Treatment: Gait belt Activity Tolerance: Patient tolerated treatment well Patient left: in bed Nurse Communication: Mobility status PT Visit Diagnosis: Unsteadiness on feet (R26.81);Muscle weakness (generalized) (M62.81);Difficulty in walking, not elsewhere classified (R26.2);Pain Pain - Right/Left:  (central) Pain - part of body:  (back)    Time: 2197-5883 PT Time Calculation (min) (ACUTE ONLY): 14 min   Charges:   PT Evaluation $PT Eval Low Complexity: 1 Low          David Reeves, PT, DPT [phone]   David Reeves 04/05/2021, 2:50 PM

## 2021-04-05 NOTE — TOC Transition Note (Signed)
Transition of Care [facility]) - CM/SW Discharge Note   Patient Details  Name: David Reeves MRN: [id number] Date of Birth: [birth year]  [facility] CM/SW Contact:  Allayne Butcher, RN Phone Number: 04/05/2021, 2:34 PM   Clinical Narrative:    RNCM received a consult for SNF or HH workup and placement.  Patient has been discharged from the emergency department.  He came in for back pain which he has had for 19 years, he has also had a previous CVA.  RNCM called and spoke with patient via phone, he lives at home alone but reports that today he is going to stay with his X wife at [address] [place].  He would like to have home health services and chooses [facility]- PCP is [facility] Lillia Abed Drubel.  Kelsey with [facility] accepts home health referral for PT and SW and patient will be seen within the next 48 hours.     Final next level of care: Home w Home Health Services Barriers to Discharge: Continued Medical Work up   Patient Goals and CMS Choice Patient states their goals for this hospitalization and ongoing recovery are:: Patient agrees to home health services CMS Medicare.gov Compare Post Acute Care list provided to:: Patient Choice offered to / list presented to : Patient  Discharge Placement                       Discharge Plan and Services   Discharge Planning Services: CM Consult Post Acute Care Choice: Home Health          DME Arranged: N/A DME Agency: NA       HH Arranged: PT, Social Work [employer] Agency: [facility] Date HH Agency Contacted: 04/05/21 Time HH Agency Contacted: 1433 Representative spoke with at [facility] Agency: Alfonzo Beers  Social Determinants of Health (SDOH) Interventions     Readmission Risk Interventions No flowsheet data found.

## 2021-04-05 NOTE — ED Notes (Signed)
Pt is asleep at this time.Equal rise and fall of the chest. Pt does not wish to continue having blood pressures taken

## 2021-04-05 NOTE — ED Notes (Signed)
Pt up working with PT.

## 2021-04-05 NOTE — ED Notes (Signed)
Patient eating breakfast at this time.

## 2021-04-05 NOTE — ED Notes (Signed)
Pt on phone with family. Pt calmly laying in bed. Resp reg/unlabored. Skin dry. Pt requesting a lidocaine patch.

## 2021-04-06 NOTE — Telephone Encounter (Signed)
Requested medication (s) are due for refill today: NO  Requested medication (s) are on the active medication list: NO  Last refill:  02/22/21  Future visit scheduled: 05/03/21  Notes to clinic:  not on current med list, not delegated, please assess.   Requested Prescriptions  Pending Prescriptions Disp Refills   baclofen (LIORESAL) 20 MG tablet [Pharmacy Med Name: BACLOFEN 20 MG TABLET] 270 tablet 1    Sig: TAKE 1 TABLET BY MOUTH THREE TIMES A DAY     Not Delegated - Analgesics:  Muscle Relaxants Failed - 04/05/2021  4:02 PM      Failed - This refill cannot be delegated      Passed - Valid encounter within last 6 months    Recent Outpatient Visits           1 month ago Alterations of sensations following cerebrovascular accident   [facility] Edwards, [place], PA-C   1 month ago Muscle spasm   [facility] Jacky Kindle, FNP   2 months ago Nerve pain   [facility] Merita Norton T, FNP   3 months ago 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

## 2021-04-07 ENCOUNTER — Other Ambulatory Visit: Payer: Self-pay | Admitting: Family Medicine

## 2021-04-07 DIAGNOSIS — F5101 Primary insomnia: Secondary | ICD-10-CM

## 2021-04-08 NOTE — Telephone Encounter (Signed)
Requested medication (s) are due for refill today: no  Requested medication (s) are on the active medication list: yes  Last refill:  02/22/21 #90  Future visit scheduled: yes  Notes to clinic:  med not delegated to NT to RF   Requested Prescriptions  Pending Prescriptions Disp Refills   zolpidem (AMBIEN) 10 MG tablet [Pharmacy Med Name: ZOLPIDEM TARTRATE 10 MG Tablet] 90 tablet     Sig: TAKE 1 TABLET AT BEDTIME     Not Delegated - Psychiatry:  Anxiolytics/Hypnotics Failed - 04/07/2021  7:39 PM      Failed - This refill cannot be delegated      Failed - Urine Drug Screen completed in last 360 days      Passed - Valid encounter within last 6 months    Recent Outpatient Visits           1 month ago Alterations of sensations following cerebrovascular accident   [facility] Alfredia Ferguson, PA-C   1 month ago Muscle spasm   [facility] Jacky Kindle, FNP   2 months ago Nerve pain   [facility] Merita Norton T, FNP   3 months ago 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

## 2021-04-09 ENCOUNTER — Telehealth: Payer: Self-pay

## 2021-04-09 NOTE — Telephone Encounter (Signed)
Copied from CRM [id number]. Topic: General - Other >> Apr 09, 2021  2:41 PM Marylen Ponto wrote: Reason for CRM: Soni a PT with [facility] reports delay in care due to patient requesting to begin on 04/10/21. Cb# [phone]

## 2021-04-10 ENCOUNTER — Telehealth: Payer: Self-pay | Admitting: Urology

## 2021-04-10 ENCOUNTER — Ambulatory Visit: Payer: Medicare HMO | Attending: Physician Assistant

## 2021-04-10 ENCOUNTER — Encounter: Payer: Medicare HMO | Admitting: Speech Pathology

## 2021-04-10 ENCOUNTER — Telehealth: Payer: Self-pay | Admitting: Physician Assistant

## 2021-04-10 ENCOUNTER — Ambulatory Visit: Payer: Medicare HMO

## 2021-04-10 ENCOUNTER — Telehealth: Payer: Self-pay

## 2021-04-10 ENCOUNTER — Other Ambulatory Visit: Payer: Self-pay

## 2021-04-10 DIAGNOSIS — F5101 Primary insomnia: Secondary | ICD-10-CM

## 2021-04-10 NOTE — Telephone Encounter (Signed)
Copied from CRM [id number]. Topic: Quick Communication - Rx Refill/Question >> Apr 10, 2021 11:14 AM Gaetana Michaelis A wrote: Medication: zolpidem (AMBIEN) 10 MG tablet [[id number]]   Has the patient contacted their pharmacy? Yes.  The patient has been contacted by their pharmacy and directed to contact their PCP (Agent: If no, request that the patient contact the pharmacy for the refill. If patient does not wish to contact the pharmacy document the reason why and proceed with request.) (Agent: If yes, when and what did the pharmacy advise?)  Preferred Pharmacy (with phone number or street name): [facility] - [place], [place] - [address]  Phone:  [phone] Fax:  [phone]  Has the patient been seen for an appointment in the last year OR does the patient have an upcoming appointment? yes.  Agent: Please be advised that RX refills may take up to 3 business days. We ask that you follow-up with your pharmacy.

## 2021-04-10 NOTE — Telephone Encounter (Signed)
I spoke with patient. Pt is aware, he states he has an appointment with neurologist in January.

## 2021-04-10 NOTE — Telephone Encounter (Signed)
Pts wife Ferdinand Cava called and would like to see about getting him scheduled for a surgery for cysts on his testicles. She would like a call back today if possible. Her call back number is [phone].

## 2021-04-10 NOTE — Telephone Encounter (Signed)
Copied from CRM [id number]. Topic: Quick Communication - Rx Refill/Question >> Apr 10, 2021 11:12 AM Gaetana Michaelis A wrote: Medication: zolpidem (AMBIEN) 10 MG tablet [[id number]]   Has the patient contacted their pharmacy? Yes.  The patient was contacted by their pharmacy and directed to contact their PCP (Agent: If no, request that the patient contact the pharmacy for the refill. If patient does not wish to contact the pharmacy document the reason why and proceed with request.) (Agent: If yes, when and what did the pharmacy advise?)  Preferred Pharmacy (with phone number or street name): [facility] - [place], [place] - [address]  Phone:  [phone] Fax:  [phone]  Has the patient been seen for an appointment in the last year OR does the patient have an upcoming appointment? Yes.    Agent: Please be advised that RX refills may take up to 3 business days. We ask that you follow-up with your pharmacy.

## 2021-04-10 NOTE — Telephone Encounter (Signed)
Please let him know earliest I'm willing to proceed with surgery is 6 months after stroke and will need clearance.    Earliest would be February.  Will need clearance by neurologist.    Vanna Scotland, MD

## 2021-04-10 NOTE — Telephone Encounter (Signed)
Home Health Verbal Orders - Caller/Agency: [facility] Callback Number: [phone] secure  Requesting OT/PT/Skilled Nursing/Social Work/Speech Therapy: PT Frequency: 1w6   

## 2021-04-11 ENCOUNTER — Other Ambulatory Visit: Payer: Self-pay | Admitting: Family Medicine

## 2021-04-11 DIAGNOSIS — F5101 Primary insomnia: Secondary | ICD-10-CM

## 2021-04-11 NOTE — Telephone Encounter (Signed)
Requested medication (s) are due for refill today:   Provider to review  Requested medication (s) are on the active medication list:   Yes  Future visit scheduled:   Yes   Last ordered: 02/22/2021 #90, 0 refills  Returned because phar. Sent a duplicate refill request and it's a non delegated refill   Requested Prescriptions  Pending Prescriptions Disp Refills   zolpidem (AMBIEN) 10 MG tablet 90 tablet 0    Sig: Take 1 tablet (10 mg total) by mouth at bedtime.     Not Delegated - Psychiatry:  Anxiolytics/Hypnotics Failed - 04/10/2021  2:49 PM      Failed - This refill cannot be delegated      Failed - Urine Drug Screen completed in last 360 days      Passed - Valid encounter within last 6 months    Recent Outpatient Visits           1 month ago Alterations of sensations following cerebrovascular accident   [facility], [place], PA-C   1 month ago Muscle spasm   [facility] Jacky Kindle, FNP   2 months ago Nerve pain   [facility] Merita Norton T, FNP   3 months ago Cerebrovascular accident (CVA) due to occlusion of left middle cerebral artery [facility])   [facility] Chrismon, Jodell Cipro, PA-C   1 year ago Chronic pain syndrome   [facility] Chrismon, Jodell Cipro, [place]       Future Appointments             In 3 weeks Alfredia Ferguson, PA-C [facility], [facility]   In 2 months Judithann Graves Nyoka Cowden, MD [facility], [facility]   In 2 months Judithann Graves Nyoka Cowden, MD [facility], [facility]   In 2 months Judithann Graves Nyoka Cowden, MD [facility], [facility]

## 2021-04-12 ENCOUNTER — Ambulatory Visit: Payer: Medicare HMO

## 2021-04-12 ENCOUNTER — Ambulatory Visit: Payer: Medicare HMO | Admitting: *Deleted

## 2021-04-12 DIAGNOSIS — I10 Essential (primary) hypertension: Secondary | ICD-10-CM

## 2021-04-12 DIAGNOSIS — M5441 Lumbago with sciatica, right side: Secondary | ICD-10-CM

## 2021-04-12 DIAGNOSIS — I6930 Unspecified sequelae of cerebral infarction: Secondary | ICD-10-CM

## 2021-04-12 DIAGNOSIS — F064 Anxiety disorder due to known physiological condition: Secondary | ICD-10-CM

## 2021-04-12 DIAGNOSIS — G8929 Other chronic pain: Secondary | ICD-10-CM

## 2021-04-12 NOTE — Chronic Care Management (AMB) (Signed)
Chronic Care Management    Clinical Social Work Note  04/12/2021 Name: David Reeves MRN: [id number] DOB: Dec 13, 1970  David Reeves is a 50 y.o. year old male who is a primary care patient of [facility]. The CCM team was consulted to assist the patient with chronic disease management and/or care coordination needs related to: [employer]  and Mental Health Counseling and Resources.   Engaged with patient by telephone for follow up visit in response to provider referral for social work chronic care management and care coordination services.   Consent to Services:  The patient was given information about Chronic Care Management services, agreed to services, and gave verbal consent prior to initiation of services.  Please see initial visit note for detailed documentation.   Patient agreed to services and consent obtained.   Assessment: Review of patient past medical history, allergies, medications, and health status, including review of relevant consultants reports was performed today as part of a comprehensive evaluation and provision of chronic care management and care coordination services.     SDOH (Social Determinants of Health) assessments and interventions performed:    Advanced Directives Status: Not addressed in this encounter.  CCM Care Plan  Allergies  Allergen Reactions   Duloxetine     Other reaction(s): Other (See Comments) Increased temperature, sweating, uncontrolled shaking, aggressive thoughts   Amitriptyline Other (See Comments)    Urine retention   Buspirone Other (See Comments)    Kidney pain   Ciprofloxacin Other (See Comments)    hallucinations   Doxepin Other (See Comments)    "Didn't feel right"   Escitalopram Other (See Comments)    Agitation   Olanzapine    Penicillins Other (See Comments)    Unknown -- childhood reaction   Sertraline Other (See Comments)    Urine retention   Trazodone Other (See Comments)     abd pain, urine retention    Outpatient Encounter Medications as of 04/12/2021  Medication Sig Note   acetaminophen (TYLENOL) 500 MG tablet Take 1,000 mg by mouth every 6 (six) hours as needed for mild pain.    amLODipine (NORVASC) 5 MG tablet Take 1 tablet (5 mg total) by mouth daily.    aspirin EC 81 MG tablet Take 81 mg by mouth daily. Swallow whole.    atorvastatin (LIPITOR) 80 MG tablet Take 1 tablet (80 mg total) by mouth daily.    Cholecalciferol (VITAMIN D3) 2000 units TABS Take by mouth.    diazepam (VALIUM) 5 MG tablet TAKE 1 TABLET (5 MG TOTAL) BY MOUTH EVERY 12 HOURS AS NEEDED FOR MUSCLE SPASM    gabapentin (NEURONTIN) 300 MG capsule Take 300 mg by mouth 3 (three) times daily.    meloxicam (MOBIC) 15 MG tablet Take 1 tablet (15 mg total) by mouth daily.    Multiple Vitamin (MULTIVITAMIN) tablet Take 1 tablet by mouth daily.    oxyCODONE-acetaminophen (PERCOCET) 7.5-325 MG tablet Take 1 tablet by mouth every 6 (six) hours as needed for severe pain.    [START ON 06/05/2021] tiZANidine (ZANAFLEX) 4 MG tablet Take 1 tablet (4 mg total) by mouth in the morning, at noon, in the evening, and at bedtime. 04/05/2021: New medication, due to start 06/05/2021   zolpidem (AMBIEN) 10 MG tablet Take 1 tablet (10 mg total) by mouth at bedtime.    No facility-administered encounter medications on file as of 04/12/2021.    Patient Active Problem List   Diagnosis Date Noted   Alterations of  sensations following cerebrovascular accident 03/08/2021   Aphasia as late effect of cerebrovascular accident (CVA) 03/08/2021   Muscle spasm 02/22/2021   Acute non-recurrent frontal sinusitis 02/22/2021   Chronic pain syndrome 01/23/2021   Primary hypertension 01/23/2021   Nerve pain 01/23/2021   Erectile dysfunction due to diseases classified elsewhere 01/23/2021   Cognitive dysfunction 01/23/2021   Scrotal edema 01/23/2021   Elevated LDL cholesterol level 01/23/2021   History of stroke with residual  deficit 01/23/2021   Primary insomnia 01/23/2021   Mouth pain 01/23/2021   Laceration of right hand without foreign body    Closed compression fracture of L1 vertebra (HCC) 05/24/2016   Lumbar stenosis with neurogenic claudication 11/01/2015   Chronic 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 11/08/2014   Hx of hemorrhoids 11/08/2014   AAA (abdominal aortic aneurysm) (HCC) 09/22/2014   Chronic pain associated with significant psychosocial dysfunction 09/22/2014   Panic disorder 09/22/2014   AB (asthmatic bronchitis) 08/17/2014   Anxiety 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 08/17/2014   Backache 08/17/2014   Lumbosacral spondylosis without myelopathy 08/17/2014   Disorder of male genital organs 08/17/2014   Brash 08/17/2014   Low back pain 08/17/2014   Tendon nodule 08/17/2014   Episodic paroxysmal anxiety disorder 08/17/2014   Hernia, inguinal, right 08/17/2014   Fast heart beat 08/17/2014   Illness 08/17/2014   Inguinal hernia 10/14/2012    Conditions to be addressed/monitored:  Cognitive dysfunction; Limited social support and Transportation Care Plan : General Social Work (Adult)  Updates made by [employer], Rushie Chestnut M, LCSW since 04/12/2021 12:00 AM     Problem: CHL AMB "PATIENT-SPECIFIC PROBLEM"   Priority: Medium  Note:   CARE PLAN ENTRY (see longitudinal plan of care for additional care plan information)  Current Barriers:  Patient with  cognitive dysfunction  -history of stroke-in need of assistance with connection to community resources  Knowledge deficits and need for support, education and care coordination related to community resources support  Financial constraints related to current medical condition, Limited social support, Transportation, ADL IADL limitations, Cognitive Deficits, and Lacks knowledge of community resource: Surveyor, quantity)  Over the next 90 days, patient will follow up with the Department of Social Services  regarding the status of his Medicaid application as well as [employer] transportation to follow up on the coordination of transportation needs  Interventions provided by LCSW: This Child psychotherapist contacted requesting assistance with coordination for transportation to medical appointments Confirmed that patient has contacted [employer] and has enrolled for transportation to future medical appointments-patient continues to use this as a Building surveyor Alternatives discussed, with regard to scheduling of transport. Confirmed that significant other was able to call the [facility] transport line Followed up on Medicaid application-patient contacted by the Department of Social Services-application remains pending Patient provided with positive reinforcement to contact [facility] Business office provided for financial assistance [phone] Patient approved for a gift card for food, patient's ex-wife agreeable to picking up card today from the providers office Referral for Psychiatry discussed, appointment with the pain clinic where a psychiatrist is a part of the treatment-appointment scheduled for 05/17/21 Patient confirmed residing now with ex-wife due to health concerns and is now homebound-HH services [facility] with [facility]  Patient Self Care Activities & Deficits:  Patient is unable to independently navigate community resource options without care coordination support  Acknowledges deficits and is motivated to resolve concern  Patient is able to contact [employer] as discussed today Unable to independently  transport self to medical appointments, has difficulty with the functioning of his right side due to stroke Self administers medications as prescribed  Please see past updates related to this goal by clicking on the "Past Updates" button in the selected goal         Follow Up Plan: SW will follow up with patient by phone over the next 14 business days        [place], [place] Clinical Social Worker  [facility] Care Management [phone]

## 2021-04-12 NOTE — Patient Instructions (Signed)
Visit Information  Thank you for taking time to visit with me today. Please don't hesitate to contact me if I can be of assistance to you before our next scheduled telephone appointment.  Following are the goals we discussed today:   - call [phone] when I need some help - follow-up on any referrals for help I am given - think ahead to make sure my need does not become an emergency - have a back-up plan - make a list of family or friends that I can call  -continue to follow upon status of Medicaid application with the Department of Social Services -continue to utilize [employer] to schedule rides to medical appointment -contact [place] business office for financial assistance for medical bills [phone]  Our next appointment is by telephone on 05/01/21 at 11am  Please call the care guide team at [phone] if you need to cancel or reschedule your appointment.   If you are experiencing a Mental Health or Behavioral Health Crisis or need someone to talk to, please call the Suicide and Crisis Lifeline: 988   The patient verbalized understanding of instructions, educational materials, and care plan provided today and declined offer to receive copy of patient instructions, educational materials, and care plan.   Telephone follow up appointment with care management team member scheduled for:05/01/21    Verna Czech, LCSW Clinical Social Worker  [facility] Care Management [phone]

## 2021-04-17 ENCOUNTER — Ambulatory Visit: Payer: Medicare HMO

## 2021-04-17 ENCOUNTER — Encounter: Payer: Medicare HMO | Admitting: Speech Pathology

## 2021-04-18 ENCOUNTER — Other Ambulatory Visit: Payer: Self-pay | Admitting: Family Medicine

## 2021-04-18 DIAGNOSIS — F5101 Primary insomnia: Secondary | ICD-10-CM

## 2021-04-18 DIAGNOSIS — M62838 Other muscle spasm: Secondary | ICD-10-CM

## 2021-04-18 NOTE — Telephone Encounter (Signed)
Requested medication (s) are due for refill today:No  Discontinued  Requested medication (s) are on the active medication list: no    Last refill: 02/22/21  #90   3 refills  Future visit scheduled yes 04/23/21  Notes to clinic:Not delegated. Discontinued 03/08/21. Not on current med profile  Requested Prescriptions  Pending Prescriptions Disp Refills   baclofen (LIORESAL) 20 MG tablet [Pharmacy Med Name: BACLOFEN 20 MG TABLET] 270 tablet 1    Sig: TAKE 1 TABLET BY MOUTH THREE TIMES A DAY     Not Delegated - Analgesics:  Muscle Relaxants Failed - 04/18/2021 12:31 PM      Failed - This refill cannot be delegated      Passed - Valid encounter within last 6 months    Recent Outpatient Visits           1 month ago Alterations of sensations following cerebrovascular accident   [facility] Alfredia Ferguson, PA-C   1 month ago Muscle spasm   [facility] Jacky Kindle, FNP   2 months ago Nerve pain   [facility] Merita Norton T, FNP   4 months ago Cerebrovascular accident (CVA) due to occlusion of left middle cerebral artery [facility])   [facility] Chrismon, Jodell Cipro, PA-C   1 year ago Chronic pain syndrome   [facility] Chrismon, Jodell Cipro, [place]       Future Appointments             In 2 weeks Alfredia Ferguson, PA-C [facility], [facility]   In 2 months Judithann Graves, Nyoka Cowden, [facility], [facility]   In 2 months Judithann Graves Nyoka Cowden, MD [facility], [facility]   In 2 months Judithann Graves, Nyoka Cowden, MD [facility], [facility]

## 2021-04-19 ENCOUNTER — Encounter: Payer: Medicare HMO | Admitting: Speech Pathology

## 2021-04-19 ENCOUNTER — Ambulatory Visit: Payer: Medicare HMO

## 2021-04-20 DIAGNOSIS — Z20822 Contact with and (suspected) exposure to covid-19: Secondary | ICD-10-CM | POA: Diagnosis not present

## 2021-04-21 DIAGNOSIS — I10 Essential (primary) hypertension: Secondary | ICD-10-CM

## 2021-04-24 ENCOUNTER — Ambulatory Visit: Payer: Medicare HMO

## 2021-04-24 ENCOUNTER — Ambulatory Visit: Payer: Medicare HMO | Attending: Neurology

## 2021-04-24 ENCOUNTER — Encounter: Payer: Medicare HMO | Admitting: Speech Pathology

## 2021-04-24 DIAGNOSIS — G4730 Sleep apnea, unspecified: Secondary | ICD-10-CM | POA: Diagnosis not present

## 2021-04-24 DIAGNOSIS — R0683 Snoring: Secondary | ICD-10-CM | POA: Diagnosis not present

## 2021-04-24 DIAGNOSIS — F5101 Primary insomnia: Secondary | ICD-10-CM | POA: Insufficient documentation

## 2021-04-24 NOTE — Progress Notes (Signed)
Will call patient mid January to discuss surgery date for February.

## 2021-04-25 ENCOUNTER — Other Ambulatory Visit: Payer: Self-pay

## 2021-04-26 ENCOUNTER — Ambulatory Visit: Payer: Medicare HMO

## 2021-04-26 ENCOUNTER — Encounter: Payer: Medicare HMO | Admitting: Speech Pathology

## 2021-04-28 ENCOUNTER — Encounter: Payer: Self-pay | Admitting: Physician Assistant

## 2021-05-01 ENCOUNTER — Encounter: Payer: Medicare HMO | Admitting: Speech Pathology

## 2021-05-01 ENCOUNTER — Ambulatory Visit: Payer: Medicare HMO

## 2021-05-01 ENCOUNTER — Ambulatory Visit: Payer: Medicare HMO | Admitting: Physical Therapy

## 2021-05-01 ENCOUNTER — Ambulatory Visit: Payer: Medicare HMO | Admitting: *Deleted

## 2021-05-01 DIAGNOSIS — I10 Essential (primary) hypertension: Secondary | ICD-10-CM

## 2021-05-01 DIAGNOSIS — F064 Anxiety disorder due to known physiological condition: Secondary | ICD-10-CM

## 2021-05-01 DIAGNOSIS — G8929 Other chronic pain: Secondary | ICD-10-CM

## 2021-05-01 DIAGNOSIS — I6930 Unspecified sequelae of cerebral infarction: Secondary | ICD-10-CM

## 2021-05-01 NOTE — Chronic Care Management (AMB) (Signed)
Chronic Care Management    Clinical Social Work Note  05/01/2021 Name: David Reeves MRN: [id number] DOB: 1971/03/16  David Reeves is a 51 y.o. year old male who is a primary care patient of [facility]. The CCM team was consulted to assist the patient with chronic disease management and/or care coordination needs related to: [employer] .   Engaged with patient by telephone for follow up visit in response to provider referral for social work chronic care management and care coordination services.   Consent to Services:  The patient was given information about Chronic Care Management services, agreed to services, and gave verbal consent prior to initiation of services.  Please see initial visit note for detailed documentation.   Patient agreed to services and consent obtained.   Assessment: Review of patient past medical history, allergies, medications, and health status, including review of relevant consultants reports was performed today as part of a comprehensive evaluation and provision of chronic care management and care coordination services.     SDOH (Social Determinants of Health) assessments and interventions performed:    Advanced Directives Status: Not addressed in this encounter.  CCM Care Plan  Allergies  Allergen Reactions   Duloxetine     Other reaction(s): Other (See Comments) Increased temperature, sweating, uncontrolled shaking, aggressive thoughts   Amitriptyline Other (See Comments)    Urine retention   Buspirone Other (See Comments)    Kidney pain   Ciprofloxacin Other (See Comments)    hallucinations   Doxepin Other (See Comments)    "Didn't feel right"   Escitalopram Other (See Comments)    Agitation   Olanzapine    Penicillins Other (See Comments)    Unknown -- childhood reaction   Sertraline Other (See Comments)    Urine retention   Trazodone Other (See Comments)    abd pain, urine retention    Outpatient  Encounter Medications as of 05/01/2021  Medication Sig Note   acetaminophen (TYLENOL) 500 MG tablet Take 1,000 mg by mouth every 6 (six) hours as needed for mild pain.    amLODipine (NORVASC) 5 MG tablet Take 1 tablet (5 mg total) by mouth daily.    aspirin EC 81 MG tablet Take 81 mg by mouth daily. Swallow whole.    atorvastatin (LIPITOR) 80 MG tablet Take 1 tablet (80 mg total) by mouth daily.    Cholecalciferol (VITAMIN D3) 2000 units TABS Take by mouth.    diazepam (VALIUM) 5 MG tablet TAKE 1 TABLET (5 MG TOTAL) BY MOUTH EVERY 12 HOURS AS NEEDED FOR MUSCLE SPASM    gabapentin (NEURONTIN) 300 MG capsule Take 300 mg by mouth 3 (three) times daily.    meloxicam (MOBIC) 15 MG tablet Take 1 tablet (15 mg total) by mouth daily.    Multiple Vitamin (MULTIVITAMIN) tablet Take 1 tablet by mouth daily.    oxyCODONE-acetaminophen (PERCOCET) 7.5-325 MG tablet Take 1 tablet by mouth every 6 (six) hours as needed for severe pain.    [START ON 06/05/2021] tiZANidine (ZANAFLEX) 4 MG tablet Take 1 tablet (4 mg total) by mouth in the morning, at noon, in the evening, and at bedtime. 04/05/2021: New medication, due to start 06/05/2021   zolpidem (AMBIEN) 10 MG tablet Take 1 tablet (10 mg total) by mouth at bedtime.    No facility-administered encounter medications on file as of 05/01/2021.    Patient Active Problem List   Diagnosis Date Noted   Alterations of sensations following cerebrovascular accident 03/08/2021  Aphasia as late effect of cerebrovascular accident (CVA) 03/08/2021   Muscle spasm 02/22/2021   Acute non-recurrent frontal sinusitis 02/22/2021   Chronic pain syndrome 01/23/2021   Primary hypertension 01/23/2021   Nerve pain 01/23/2021   Erectile dysfunction due to diseases classified elsewhere 01/23/2021   Cognitive dysfunction 01/23/2021   Scrotal edema 01/23/2021   Elevated LDL cholesterol level 01/23/2021   History of stroke with residual deficit 01/23/2021   Primary insomnia  01/23/2021   Mouth pain 01/23/2021   Laceration of right hand without foreign body    Closed compression fracture of L1 vertebra (HCC) 05/24/2016   Lumbar stenosis with neurogenic claudication 11/01/2015   Chronic 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 11/08/2014   Hx of hemorrhoids 11/08/2014   AAA (abdominal aortic aneurysm) (HCC) 09/22/2014   Chronic pain associated with significant psychosocial dysfunction 09/22/2014   Panic disorder 09/22/2014   AB (asthmatic bronchitis) 08/17/2014   Anxiety 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 08/17/2014   Backache 08/17/2014   Lumbosacral spondylosis without myelopathy 08/17/2014   Disorder of male genital organs 08/17/2014   Brash 08/17/2014   Low back pain 08/17/2014   Tendon nodule 08/17/2014   Episodic paroxysmal anxiety disorder 08/17/2014   Hernia, inguinal, right 08/17/2014   Fast heart beat 08/17/2014   Illness 08/17/2014   Inguinal hernia 10/14/2012    Conditions to be addressed/monitored: ;  Cognitive dysfunction; Limited social support and transportation Care Plan : General Social Work (Adult)  Updates made by [employer], Rushie Chestnut M, LCSW since 05/01/2021 12:00 AM     Problem: CHL AMB "PATIENT-SPECIFIC PROBLEM"   Priority: Medium  Note:   CARE PLAN ENTRY (see longitudinal plan of care for additional care plan information)  Current Barriers:  Patient with  cognitive dysfunction  -history of stroke-in need of assistance with connection to community resources  Knowledge deficits and need for support, education and care coordination related to community resources support  Financial constraints related to current medical condition, Limited social support, Transportation, ADL IADL limitations, Cognitive Deficits, and Lacks knowledge of community resource: Surveyor, quantity)  Over the next 90 days, patient will follow up with the Department of Social Services regarding the status of his Medicaid  application as well as [employer] transportation to follow up on the coordination of transportation needs  Interventions provided by LCSW: This Child psychotherapist contacted requesting assistance with coordination for transportation to medical appointments Confirmed improvement in coordinated services-per patient "everything is coming together now" Confirmed that patient has contacted [employer] and has enrolled for transportation to future medical appointments-patient continues to use this as a Building surveyor as well as ex-wife and significant other Followed up on Medicaid application-patient contacted by the Department of Social Services-application remains pending-patient encouraged to contact the Department of Social Services to follow up on status of pending application Patient provided with positive reinforcement to contact [employer] office provided for financial assistance [phone] not called to date Patient approved for a gift card for food through [facility], card received patient appreciative Referral for Psychiatry discussed, appointment with the pain clinic where a psychiatrist is a part of the treatment-appointment scheduled for 05/17/21 Patient confirmed residing now with ex-wife due to health concerns and is now homebound-HH services continue through [facility]  Patient Self Care Activities & Deficits:  Patient is unable to independently navigate community resource options without care coordination support  Acknowledges deficits and is motivated to resolve concern  Patient is able to contact [facility]  Transportation as discussed today Unable to independently transport self to medical appointments, has difficulty with the functioning of his right side due to stroke Self administers medications as prescribed  Please see past updates related to this goal by clicking on the "Past Updates" button in the selected goal         Follow Up Plan: SW will  follow up with patient by phone over the next 30 business days       Corbyn [place], [place] Clinical Social Worker  [facility] Care Management [phone]

## 2021-05-01 NOTE — Patient Instructions (Signed)
Visit Information  Thank you for taking time to visit with me today. Please don't hesitate to contact me if I can be of assistance to you before our next scheduled telephone appointment.  Following are the goals we discussed today:   - call 211 when I need some help - follow-up on any referrals for help I am given - think ahead to make sure my need does not become an emergency - have a back-up plan - make a list of family or friends that I can call  -continue to follow upon status of Medicaid application with the Department of Social Services -continue to utilize [employer] to schedule rides to medical appointment -contact Day business office for financial assistance for medical bills   Our next appointment is by telephone on 05/31/21 at 11am  Please call the care guide team at [phone] if you need to cancel or reschedule your appointment.   If you are experiencing a Mental Health or [place] or need someone to talk to, please call the Suicide and Crisis Lifeline: 988   The patient verbalized understanding of instructions, educational materials, and care plan provided today and declined offer to receive copy of patient instructions, educational materials, and care plan.   Telephone follow up appointment with care management team member scheduled for: 05/31/21    Elliot Gurney, [place] Worker  [facility] [phone]

## 2021-05-03 ENCOUNTER — Encounter: Payer: Medicare HMO | Admitting: Speech Pathology

## 2021-05-03 ENCOUNTER — Other Ambulatory Visit: Payer: Self-pay

## 2021-05-03 ENCOUNTER — Ambulatory Visit: Payer: Medicare HMO | Admitting: Family Medicine

## 2021-05-03 ENCOUNTER — Ambulatory Visit: Payer: Medicare HMO

## 2021-05-03 ENCOUNTER — Encounter: Payer: Self-pay | Admitting: Physician Assistant

## 2021-05-03 ENCOUNTER — Ambulatory Visit (INDEPENDENT_AMBULATORY_CARE_PROVIDER_SITE_OTHER): Payer: Medicare HMO | Admitting: Physician Assistant

## 2021-05-03 ENCOUNTER — Ambulatory Visit: Payer: Medicare HMO | Admitting: Physical Therapy

## 2021-05-03 VITALS — BP 115/69 | HR 71 | Temp 98.2°F | Ht 73.0 in | Wt 220.9 lb

## 2021-05-03 DIAGNOSIS — R209 Unspecified disturbances of skin sensation: Secondary | ICD-10-CM

## 2021-05-03 DIAGNOSIS — G8929 Other chronic pain: Secondary | ICD-10-CM | POA: Diagnosis not present

## 2021-05-03 DIAGNOSIS — G894 Chronic pain syndrome: Secondary | ICD-10-CM

## 2021-05-03 DIAGNOSIS — M47817 Spondylosis without myelopathy or radiculopathy, lumbosacral region: Secondary | ICD-10-CM | POA: Diagnosis not present

## 2021-05-03 DIAGNOSIS — F410 Panic disorder [episodic paroxysmal anxiety] without agoraphobia: Secondary | ICD-10-CM | POA: Diagnosis not present

## 2021-05-03 DIAGNOSIS — I69398 Other sequelae of cerebral infarction: Secondary | ICD-10-CM | POA: Diagnosis not present

## 2021-05-03 DIAGNOSIS — M62838 Other muscle spasm: Secondary | ICD-10-CM | POA: Diagnosis not present

## 2021-05-03 DIAGNOSIS — M5441 Lumbago with sciatica, right side: Secondary | ICD-10-CM | POA: Diagnosis not present

## 2021-05-03 DIAGNOSIS — S32010S Wedge compression fracture of first lumbar vertebra, sequela: Secondary | ICD-10-CM | POA: Diagnosis not present

## 2021-05-03 MED ORDER — DIAZEPAM 5 MG PO TABS: ORAL_TABLET | ORAL | 0 refills | Status: DC

## 2021-05-03 NOTE — Patient Instructions (Signed)
Preventive Care 40-52 Years Old, Male °Preventive care refers to lifestyle choices and visits with your health care provider that can promote health and wellness. Preventive care visits are also called wellness exams. °What can I expect for my preventive care visit? °Counseling °During your preventive care visit, your health care provider may ask about your: °Medical history, including: °Past medical problems. °Family medical history. °Current health, including: °Emotional well-being. °Home life and relationship well-being. °Sexual activity. °Lifestyle, including: °Alcohol, nicotine or tobacco, and drug use. °Access to firearms. °Diet, exercise, and sleep habits. °Safety issues such as seatbelt and bike helmet use. °Sunscreen use. °Work and work environment. °Physical exam °Your health care provider will check your: °Height and weight. These may be used to calculate your BMI (body mass index). BMI is a measurement that tells if you are at a healthy weight. °Waist circumference. This measures the distance around your waistline. This measurement also tells if you are at a healthy weight and may help predict your risk of certain diseases, such as type 2 diabetes and high blood pressure. °Heart rate and blood pressure. °Body temperature. °Skin for abnormal spots. °What immunizations do I need? °Vaccines are usually given at various ages, according to a schedule. Your health care provider will recommend vaccines for you based on your age, medical history, and lifestyle or other factors, such as travel or where you work. °What tests do I need? °Screening °Your health care provider may recommend screening tests for certain conditions. This may include: °Lipid and cholesterol levels. °Diabetes screening. This is done by checking your blood sugar (glucose) after you have not eaten for a while (fasting). °Hepatitis B test. °Hepatitis C test. °HIV (human immunodeficiency virus) test. °STI (sexually transmitted infection)  testing, if you are at risk. °Lung cancer screening. °Prostate cancer screening. °Colorectal cancer screening. °Talk with your health care provider about your test results, treatment options, and if necessary, the need for more tests. °Follow these instructions at home: °Eating and drinking ° °Eat a diet that includes fresh fruits and vegetables, whole grains, lean protein, and low-fat dairy products. °Take vitamin and mineral supplements as recommended by your health care provider. °Do not drink alcohol if your health care provider tells you not to drink. °If you drink alcohol: °Limit how much you have to 0-2 drinks a day. °Know how much alcohol is in your drink. In the [place] one drink equals one 12 oz bottle of beer (355 mL), one 5 oz glass of wine (148 mL), or one 1½ oz glass of hard liquor (44 mL). °Lifestyle °Brush your teeth every morning and night with fluoride toothpaste. Floss one time each day. °Exercise for at least 30 minutes 5 or more days each week. °Do not use any products that contain nicotine or tobacco. These products include cigarettes, chewing tobacco, and vaping devices, such as e-cigarettes. If you need help quitting, ask your health care provider. °Do not use drugs. °If you are sexually active, practice safe sex. Use a condom or other form of protection to prevent STIs. °Take aspirin only as told by your health care provider. Make sure that you understand how much to take and what form to take. Work with your health care provider to find out whether it is safe and beneficial for you to take aspirin daily. °Find healthy ways to manage stress, such as: °Meditation, yoga, or listening to music. °Journaling. °Talking to a trusted person. °Spending time with friends and family. °Minimize exposure to UV radiation to reduce your   risk of skin cancer. °Safety °Always wear your seat belt while driving or riding in a vehicle. °Do not drive: °If you have been drinking alcohol. Do not ride with someone who  has been drinking. °When you are tired or distracted. °While texting. °If you have been using any mind-altering substances or drugs. °Wear a helmet and other protective equipment during sports activities. °If you have firearms in your house, make sure you follow all gun safety procedures. °What's next? °Go to your health care provider once a year for an annual wellness visit. °Ask your health care provider how often you should have your eyes and teeth checked. °Stay up to date on all vaccines. °This information is not intended to replace advice given to you by your health care provider. Make sure you discuss any questions you have with your health care provider. °Document Revised: 10/04/2020 Document Reviewed: 10/04/2020 °Elsevier Patient Education © 2022 Elsevier Inc. ° °

## 2021-05-03 NOTE — Progress Notes (Signed)
I,Lashunta Frieden,acting as a Neurosurgeon for [employer], PA-C.,have documented all relevant documentation on the behalf of Alfredia Ferguson, PA-C,as directed by  Alfredia Ferguson, PA-C while in the presence of Alfredia Ferguson, PA-C.   Established patient visit   Patient: David Reeves   DOB: May 11, 1970   51 y.o. Male  MRN: [id number] Visit Date: 05/03/2021  Today's healthcare provider: Alfredia Ferguson, PA-C   Cc. 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  Since our last visit, David Reeves has moved in with his ex-wife, has been completing home PT with success in his right arm. Rates his baseline pain at a 6, which worsens if she sits for extended periods of time. States home PT ends 1/23, appt with [facility] center pain management 1/26. Reports nighttime panic attacks are improving as his pain improves, but they still happen throughout the week.  No issues with current pain regimen, adequate at controlling. No interest in changing meds or eliminating doses at this time.  He has questions about a popping sensation in his right shoulder when he raises his arm up to shoulder level. No pain associated.  Medications: Outpatient Medications Prior to Visit  Medication Sig   acetaminophen (TYLENOL) 500 MG tablet Take 1,000 mg by mouth every 6 (six) hours as needed for mild pain.   amLODipine (NORVASC) 5 MG tablet Take 1 tablet (5 mg total) by mouth daily.   aspirin EC 81 MG tablet Take 81 mg by mouth daily. Swallow whole.   atorvastatin (LIPITOR) 80 MG tablet Take 1 tablet (80 mg total) by mouth daily.   Cholecalciferol (VITAMIN D3) 2000 units TABS Take by mouth.   meloxicam (MOBIC) 15 MG tablet Take 1 tablet (15 mg total) by mouth daily.   Multiple Vitamin (MULTIVITAMIN) tablet Take 1 tablet by mouth daily.   [START ON 06/05/2021] tiZANidine (ZANAFLEX) 4  MG tablet Take 1 tablet (4 mg total) by mouth in the morning, at noon, in the evening, and at bedtime.   zolpidem (AMBIEN) 10 MG tablet Take 1 tablet (10 mg total) by mouth at bedtime.   [DISCONTINUED] diazepam (VALIUM) 5 MG tablet TAKE 1 TABLET (5 MG TOTAL) BY MOUTH EVERY 12 HOURS AS NEEDED FOR MUSCLE SPASM   [DISCONTINUED] gabapentin (NEURONTIN) 300 MG capsule Take 300 mg by mouth 3 (three) times daily.   [DISCONTINUED] oxyCODONE-acetaminophen (PERCOCET) 7.5-325 MG tablet Take 1 tablet by mouth every 6 (six) hours as needed for severe pain.   No facility-administered medications prior to visit.    Review of Systems  Respiratory:  Negative for shortness of breath.   Cardiovascular:  Negative for chest pain.  Musculoskeletal:  Positive for arthralgias and back pain.  Neurological:  Positive for speech difficulty. Negative for dizziness and headaches.      Objective    Blood pressure 115/69, pulse 71, temperature 98.2 F (36.8 C), temperature source Oral, height 6\' 1"  (1.854 m), weight 220 lb 14.4 oz (100.2 kg), SpO2 96 %.   Physical Exam Constitutional:      Appearance: He is not ill-appearing.  HENT:     Head: Normocephalic.  Eyes:     Conjunctiva/sclera: Conjunctivae normal.  Cardiovascular:     Rate and Rhythm: Normal rate.  Pulmonary:     Effort: Pulmonary effort is normal. No respiratory distress.  Musculoskeletal:     Comments: Significant  improvement in ROM of right arm, able to lift to shoulder level with limited difficulty and slightly above with effort. Significant improvement with grip strength and dexterity of right hand.  As pt abducts right arm, tightness is felt to right shoulder muscles.   Neurological:     General: No focal deficit present.     Mental Status: He is alert and oriented to person, place, and time.  Psychiatric:        Mood and Affect: Mood normal.        Behavior: Behavior normal.     No results found for any visits on 05/03/21.  Assessment &  Plan     Problem List Items Addressed This Visit       Nervous and Auditory   Chronic 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   Relevant Medications   diazepam (VALIUM) 5 MG tablet (Start on 05/07/2021)   Alterations of sensations following cerebrovascular accident - Primary    Specific right sided thoracic back pain improved w/ medication, but largely from physical therapy.  Pt compliant with Valium 5 mg BID, tizanidine 4 mg four time a day, mobic and tylenol prn. Pt did have an ED visit since we last spoke where he received 10 tabs of percocet.   Pt still aware of increased fall risk, inc risk of respiratory depression, especially when combined with opioids from the ED, drowsiness. He does not drive.   Overall large improvement in my opinion from physical therapy and social support from living with his ex wife and daughter. Continues to follow with [facility] social work calls.   F/u 3 mo, meds still rx on monthly basis. Hopeful to stop valium after consult w/ specialist.        Musculoskeletal and Integument   Lumbosacral spondylosis without myelopathy   Relevant Medications   diazepam (VALIUM) 5 MG tablet (Start on 05/07/2021)   Closed compression fracture of L1 vertebra (HCC)   Relevant Medications   diazepam (VALIUM) 5 MG tablet (Start on 05/07/2021)     Other   Chronic pain associated with significant psychosocial dysfunction    Overall attitude and behavior improved with lower levels of pain daily and increased social support      Panic disorder    Some improvement to nocturnal panic attacks, unsure if related to d/c of donezepil. Has f/u with neurologist 05/16/21. Still takes ambien to sleep, may also be contributing but pt not ready to try without      Relevant Medications   diazepam (VALIUM) 5 MG tablet (Start on 05/07/2021)   Muscle spasm    Right arm 'popping' sensation is tightness associated with residual deficits from CVA. Advised he mention to PT to see if  they can perform manual therapy to help release. Goal is to eventually complete therapy out-patient instead of at home to have more tools available to him.         Return in about 3 months (around 08/01/2021) for chronic pain.      I, [name], PA-C have reviewed all documentation for this visit. The documentation on  05/03/2021  for the exam, diagnosis, procedures, and orders are all accurate and complete.    [name], PA-C  [facility] [phone] (phone) [phone] (fax)  [place]

## 2021-05-04 ENCOUNTER — Encounter: Payer: Self-pay | Admitting: Physician Assistant

## 2021-05-04 NOTE — Assessment & Plan Note (Signed)
Specific right sided thoracic back pain improved w/ medication, but largely from physical therapy.  Pt compliant with Valium 5 mg BID, tizanidine 4 mg four time a day, mobic and tylenol prn. Pt did have an ED visit since we last spoke where he received 10 tabs of percocet.   Pt still aware of increased fall risk, inc risk of respiratory depression, especially when combined with opioids from the ED, drowsiness. He does not drive.   Overall large improvement in my opinion from physical therapy and social support from living with his ex wife and daughter. Continues to follow with [facility] social work calls.   F/u 3 mo, meds still rx on monthly basis. Hopeful to stop valium after consult w/ specialist.

## 2021-05-04 NOTE — Assessment & Plan Note (Signed)
Some improvement to nocturnal panic attacks, unsure if related to d/c of donezepil. Has f/u with neurologist 05/16/21. Still takes ambien to sleep, may also be contributing but pt not ready to try without

## 2021-05-04 NOTE — Assessment & Plan Note (Signed)
Right arm 'popping' sensation is tightness associated with residual deficits from CVA. Advised he mention to PT to see if they can perform manual therapy to help release. Goal is to eventually complete therapy out-patient instead of at home to have more tools available to him.

## 2021-05-04 NOTE — Assessment & Plan Note (Signed)
Overall attitude and behavior improved with lower levels of pain daily and increased social support

## 2021-05-08 ENCOUNTER — Ambulatory Visit: Payer: Medicare HMO | Admitting: Physical Therapy

## 2021-05-08 ENCOUNTER — Telehealth: Payer: Self-pay | Admitting: Physician Assistant

## 2021-05-08 ENCOUNTER — Encounter: Payer: Medicare HMO | Admitting: Speech Pathology

## 2021-05-08 ENCOUNTER — Ambulatory Visit: Payer: Medicare HMO

## 2021-05-08 ENCOUNTER — Encounter: Payer: Self-pay | Admitting: Physician Assistant

## 2021-05-08 NOTE — Telephone Encounter (Signed)
We had a conversation months ago that we were stopping it to see if it improved his nightmares/panic attacks. I was under the impression he wasn't taking. If he wants to continue I can refill.

## 2021-05-08 NOTE — Telephone Encounter (Signed)
Patients wife called in for patient requesting donepez, 100 mg, that was given to him while in rehab back in august for memory and dimentia. He is asking for Dr Thedore Mins to prescribe this again.

## 2021-05-08 NOTE — Telephone Encounter (Signed)
Pt states he recently restarted and would like to continue.  Please send a 90 day supply to [facility] [place].    Thanks,   -Vernona Rieger

## 2021-05-08 NOTE — Telephone Encounter (Signed)
See MyChart message.  ° °Thanks,  ° °-Emilynn Srinivasan  °

## 2021-05-10 ENCOUNTER — Other Ambulatory Visit: Payer: Self-pay | Admitting: Family Medicine

## 2021-05-10 ENCOUNTER — Encounter: Payer: Medicare HMO | Admitting: Speech Pathology

## 2021-05-10 ENCOUNTER — Ambulatory Visit: Payer: Medicare HMO | Admitting: Physical Therapy

## 2021-05-10 ENCOUNTER — Ambulatory Visit: Payer: Medicare HMO

## 2021-05-10 DIAGNOSIS — F5101 Primary insomnia: Secondary | ICD-10-CM

## 2021-05-10 NOTE — Progress Notes (Unsigned)
REQUEST FOR SURGICAL CLEARANCE       Date: Date: 05/10/21  Faxed to: Dr. Cristopher Peru, MD  Surgeon: Dr. Vanna Scotland, MD     Date of Surgery: TBD  Operation: Spermatocelectomy  Anesthesia Type: General   Diagnosis: Spermatocele  Patient Requires:    Medical Clearance : Yes  Reason: Neurology. Specifically asking for recommended timeframe for performing this nonurgent elective surgery in light of recent stroke.  I would Ideally prefer to hold 81mg  Aspirin but if deemed too risky, will do surgery while taking.   Risk Assessment:    Low   []       Moderate   []     High   []           This patient is optimized for surgery  YES []       NO   []    I recommend further assessment/workup prior to surgery. YES []      NO  []   Appointment scheduled for: _______________________   Further recommendations: ____________________________________     Physician Signature:__________________________________   Printed Name: ________________________________________   Date: _________________

## 2021-05-10 NOTE — Telephone Encounter (Signed)
Requested medication (s) are due for refill today: yes   Requested medication (s) are on the active medication list: yes  Last refill:  02/22/21-05/23/21 #90 0 refills  Future visit scheduled: yes  Notes to clinic:  not delegated per protocol. Mail order pharmacy .      Requested Prescriptions  Pending Prescriptions Disp Refills   zolpidem (AMBIEN) 10 MG tablet [Pharmacy Med Name: ZOLPIDEM TARTRATE 10 MG Tablet] 90 tablet     Sig: TAKE 1 TABLET AT BEDTIME     Not Delegated - Psychiatry:  Anxiolytics/Hypnotics Failed - 05/10/2021 11:12 AM      Failed - This refill cannot be delegated      Failed - Urine Drug Screen completed in last 360 days      Passed - Valid encounter within last 6 months    Recent Outpatient Visits           1 week ago Alterations of sensations following cerebrovascular accident   [facility] Ok Edwards, [place], PA-C   2 months ago Alterations of sensations following cerebrovascular accident   [facility] Ok Edwards, [place], PA-C   2 months ago Muscle spasm   [facility] Jacky Kindle, FNP   3 months ago Nerve pain   [facility] Merita Norton T, FNP   4 months ago Cerebrovascular accident (CVA) due to occlusion of left middle cerebral artery [facility])   [facility] Chrismon, Jodell Cipro, PA-C       Future Appointments             In 2 months Drubel, Lillia Abed, PA-C [employer]

## 2021-05-11 NOTE — Telephone Encounter (Signed)
Medication Refill - Medication: donepezil (ARICEPT) 10 MG tablet  Pt called to report that he is completely out of his current supply, wants refill today. Says he was told this was getting called in for him since January 17th. See chart  [phone] Pt would like a call back to notify him when this is sent   Has the patient contacted their pharmacy? Yes.   (Agent: If no, request that the patient contact the pharmacy for the refill. If patient does not wish to contact the pharmacy document the reason why and proceed with request.) (Agent: If yes, when and what did the pharmacy advise?)  Preferred Pharmacy (with phone number or street name):  [id number] [place], [place] - [address]  [address] [place] [address]  Phone: [phone] Fax: [phone]   Has the patient been seen for an appointment in the last year OR does the patient have an upcoming appointment? Yes.    Agent: Please be advised that RX refills may take up to 3 business days. We ask that you follow-up with your pharmacy.

## 2021-05-11 NOTE — Telephone Encounter (Signed)
Pt called, he is wanting the Aricept re-prescribed. Says that he was taking it previously and from when he was in the hospital at [facility]. Pt didn't want refill on Ambien, he states that he would like dose increased for Ambien due to not sleeping good. Advised pt I will send this to provider and it may be Monday before anything is sent in. Pt verbalized understanding.

## 2021-05-14 ENCOUNTER — Other Ambulatory Visit: Payer: Self-pay | Admitting: Physician Assistant

## 2021-05-14 DIAGNOSIS — I6930 Unspecified sequelae of cerebral infarction: Secondary | ICD-10-CM

## 2021-05-14 DIAGNOSIS — F09 Unspecified mental disorder due to known physiological condition: Secondary | ICD-10-CM

## 2021-05-14 MED ORDER — DONEPEZIL HCL 10 MG PO TABS: 10.0000 mg | ORAL_TABLET | Freq: Every day | ORAL | 1 refills | Status: DC

## 2021-05-15 ENCOUNTER — Encounter: Payer: Medicare HMO | Admitting: Speech Pathology

## 2021-05-15 ENCOUNTER — Ambulatory Visit: Payer: Medicare HMO

## 2021-05-15 ENCOUNTER — Ambulatory Visit: Payer: Medicare HMO | Admitting: Physical Therapy

## 2021-05-15 ENCOUNTER — Encounter: Payer: Self-pay | Admitting: Physician Assistant

## 2021-05-16 ENCOUNTER — Ambulatory Visit: Payer: Self-pay

## 2021-05-16 ENCOUNTER — Other Ambulatory Visit: Payer: Self-pay | Admitting: Family Medicine

## 2021-05-16 DIAGNOSIS — I10 Essential (primary) hypertension: Secondary | ICD-10-CM

## 2021-05-16 DIAGNOSIS — F5101 Primary insomnia: Secondary | ICD-10-CM

## 2021-05-16 DIAGNOSIS — G894 Chronic pain syndrome: Secondary | ICD-10-CM

## 2021-05-16 DIAGNOSIS — I6930 Unspecified sequelae of cerebral infarction: Secondary | ICD-10-CM

## 2021-05-16 NOTE — Chronic Care Management (AMB) (Signed)
Chronic Care Management   CCM RN Visit Note  05/16/2021 Name: David Reeves MRN: [id number] DOB: 11/09/1970  Subjective: David Reeves is a 51 y.o. year old male who is a primary care patient of [name], [place]. The care management team was consulted for assistance with disease management and care coordination needs.    Engaged with patient by telephone for follow up visit in response to provider referral for case management and care coordination services.   Consent to Services:  The patient was given information about Chronic Care Management services, agreed to services, and gave verbal consent prior to initiation of services.  Please see initial visit note for detailed documentation.    Assessment: Review of patient past medical history, allergies, medications, health status, including review of consultants reports, laboratory and other test data, was performed as part of comprehensive evaluation and provision of chronic care management services.   SDOH (Social Determinants of Health) assessments and interventions performed: No  CCM Care Plan  Allergies  Allergen Reactions   Duloxetine     Other reaction(s): Other (See Comments) Increased temperature, sweating, uncontrolled shaking, aggressive thoughts   Amitriptyline Other (See Comments)    Urine retention   Buspirone Other (See Comments)    Kidney pain   Ciprofloxacin Other (See Comments)    hallucinations   Doxepin Other (See Comments)    "Didn't feel right"   Escitalopram Other (See Comments)    Agitation   Olanzapine    Penicillins Other (See Comments)    Unknown -- childhood reaction   Sertraline Other (See Comments)    Urine retention   Trazodone Other (See Comments)    abd pain, urine retention    Outpatient Encounter Medications as of 05/16/2021  Medication Sig Note   acetaminophen (TYLENOL) 500 MG tablet Take 1,000 mg by mouth every 6 (six) hours as needed for mild pain.    amLODipine  (NORVASC) 5 MG tablet Take 1 tablet (5 mg total) by mouth daily.    aspirin EC 81 MG tablet Take 81 mg by mouth daily. Swallow whole.    atorvastatin (LIPITOR) 80 MG tablet Take 1 tablet (80 mg total) by mouth daily.    Cholecalciferol (VITAMIN D3) 2000 units TABS Take by mouth.    diazepam (VALIUM) 5 MG tablet TAKE 1 TABLET (5 MG TOTAL) BY MOUTH EVERY 12 HOURS AS NEEDED FOR MUSCLE SPASM    donepezil (ARICEPT) 10 MG tablet Take 1 tablet (10 mg total) by mouth at bedtime.    meloxicam (MOBIC) 15 MG tablet Take 1 tablet (15 mg total) by mouth daily.    Multiple Vitamin (MULTIVITAMIN) tablet Take 1 tablet by mouth daily.    [START ON 06/05/2021] tiZANidine (ZANAFLEX) 4 MG tablet Take 1 tablet (4 mg total) by mouth in the morning, at noon, in the evening, and at bedtime. 04/05/2021: New medication, due to start 06/05/2021   zolpidem (AMBIEN) 10 MG tablet Take 1 tablet (10 mg total) by mouth at bedtime. 05/16/2021: Patient reports medication is not effective/Requesting to change   No facility-administered encounter medications on file as of 05/16/2021.    Patient Active Problem List   Diagnosis Date Noted   Alterations of sensations following cerebrovascular accident 03/08/2021   Aphasia as late effect of cerebrovascular accident (CVA) 03/08/2021   Muscle spasm 02/22/2021   Acute non-recurrent frontal sinusitis 02/22/2021   Chronic pain syndrome 01/23/2021   Primary hypertension 01/23/2021   Nerve pain 01/23/2021   Erectile dysfunction due to diseases  classified elsewhere 01/23/2021   Cognitive dysfunction 01/23/2021   Scrotal edema 01/23/2021   Elevated LDL cholesterol level 01/23/2021   History of stroke with residual deficit 01/23/2021   Primary insomnia 01/23/2021   Mouth pain 01/23/2021   Laceration of right hand without foreign body    Closed compression fracture of L1 vertebra ([place]) 05/24/2016   Lumbar stenosis with neurogenic claudication 11/01/2015   Chronic bilateral low back pain  with right-sided sciatica 11/08/2014   Hx of hemorrhoids 11/08/2014   AAA (abdominal aortic aneurysm) ([place]) 09/22/2014   Chronic pain associated with significant psychosocial dysfunction 09/22/2014   Panic disorder 09/22/2014   AB (asthmatic bronchitis) 08/17/2014   Anxiety 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 08/17/2014   Backache 08/17/2014   Lumbosacral spondylosis without myelopathy 08/17/2014   Disorder of male genital organs 08/17/2014   Brash 08/17/2014   Low back pain 08/17/2014   Tendon nodule 08/17/2014   Episodic paroxysmal anxiety disorder 08/17/2014   Hernia, inguinal, right 08/17/2014   Fast heart beat 08/17/2014   Illness 08/17/2014   Inguinal hernia 10/14/2012     Patient Care Plan: RN Care Manager Plan of Care     Problem Identified: Hypertension and Hyperlipidemia      Long-Range Goal: Disease Progression Prevented or Minimized Completed 05/16/2021  Start Date: 02/12/2021  Expected End Date: 05/13/2021  Priority: High  Note:   Current Barriers:  Chronic Disease Management support and education needs related to HTN and HLD   RNCM Clinical Goal(s):  Patient will adhere to treatment plan for management of HTN and HLD Patient will work with the [employer] to discuss options for transportation and nutrition assistance.   Interventions: 1:1 collaboration with primary care provider regarding development and update of comprehensive plan of care as evidenced by provider attestation and co-signature Inter-disciplinary care team collaboration (see longitudinal plan of care) Evaluation of current treatment plan related to  self management and patient's adherence to plan as established by provider  Hypertension Interventions: Reviewed plan for hypertension management. Reports taking medications as prescribed and monitoring BP as advised. Reviewed established BP parameters. Reports readings have been within range. Reports reading today was 106/68.  Advised to monitor and record readings. Reviewed nutritional intake. Reports good appetite. Attempting to adhere to the recommended cardiac prudent/heart healthy diet.  Discussed activity level. He reports successfully completing [place] today with goals met earlier today. Activity level remains limited due to chronic pain. Also expressed concerns that he is less active during the day due to lack of sleep. Currently taking Ambien 40m. Requesting to increase dose or try another medication. Request forwarded to his PCP. Reviewed s/sx of heart attack, stroke along with indications for seeking immediate medical attention.   Hyperlipidemia Interventions: Reviewed medications. Advised to continue taking as prescribed and notify his provider if unable to tolerate prescribed regimen. Reviewed provider established cholesterol goals. Advised to complete laboratory monitoring as ordered. Reviewed importance of limiting foods high in cholesterol.   Patient Goals/Self-Care Activities: Patient will self administer medications as prescribed Patient will attend all scheduled provider appointments Patient will complete labs as advised Patient will call pharmacy for medication refills Patient will continue to perform ADL's independently      PLAN Message forwarded to PCP regarding request to increase Ambien dose or change medications. Mr. KNadalagreed to contact the clinic if he requires additional outreach. The care management team will gladly assist.   FCristy FriedlanderHealth/[facility] Care Management [phone]   .

## 2021-05-16 NOTE — Patient Instructions (Signed)
Thank you for allowing the Chronic Care Management team to participate in your care. It was great speaking with you today! °

## 2021-05-17 ENCOUNTER — Encounter: Payer: Self-pay | Admitting: Physician Assistant

## 2021-05-17 ENCOUNTER — Encounter: Payer: Medicare HMO | Admitting: Speech Pathology

## 2021-05-17 ENCOUNTER — Ambulatory Visit: Payer: Medicare HMO

## 2021-05-17 DIAGNOSIS — G8929 Other chronic pain: Secondary | ICD-10-CM | POA: Diagnosis not present

## 2021-05-17 DIAGNOSIS — Z8673 Personal history of transient ischemic attack (TIA), and cerebral infarction without residual deficits: Secondary | ICD-10-CM | POA: Diagnosis not present

## 2021-05-17 DIAGNOSIS — M546 Pain in thoracic spine: Secondary | ICD-10-CM | POA: Diagnosis not present

## 2021-05-17 DIAGNOSIS — M545 Low back pain, unspecified: Secondary | ICD-10-CM | POA: Diagnosis not present

## 2021-05-17 DIAGNOSIS — M48062 Spinal stenosis, lumbar region with neurogenic claudication: Secondary | ICD-10-CM | POA: Diagnosis not present

## 2021-05-18 ENCOUNTER — Encounter: Payer: Self-pay | Admitting: Physician Assistant

## 2021-05-18 ENCOUNTER — Other Ambulatory Visit: Payer: Self-pay | Admitting: Physician Assistant

## 2021-05-18 DIAGNOSIS — I6930 Unspecified sequelae of cerebral infarction: Secondary | ICD-10-CM

## 2021-05-18 DIAGNOSIS — F410 Panic disorder [episodic paroxysmal anxiety] without agoraphobia: Secondary | ICD-10-CM

## 2021-05-18 DIAGNOSIS — F5101 Primary insomnia: Secondary | ICD-10-CM

## 2021-05-18 MED ORDER — PRAZOSIN HCL 1 MG PO CAPS: 1.0000 mg | ORAL_CAPSULE | Freq: Every day | ORAL | 1 refills | Status: DC

## 2021-05-18 NOTE — Progress Notes (Signed)
Discussion with pt over mychart  Will continue filling valium with plan to taper off after spinal injections if successfully [facility] specialist sending him to another stroke specialist  Wants to continue ot outpt, but he did no show his first appt. Needs another referral  Ambien 10 mg not enough, give psych hx and frequent nighttime panic attacks, will try prazosin nightly instead.

## 2021-05-19 ENCOUNTER — Other Ambulatory Visit: Payer: Self-pay | Admitting: Family Medicine

## 2021-05-19 DIAGNOSIS — M62838 Other muscle spasm: Secondary | ICD-10-CM

## 2021-05-20 NOTE — Telephone Encounter (Signed)
Requested medication (s) are due for refill today: n/a  Requested medication (s) are on the active medication list: n/a  Last refill:  n/a  Future visit scheduled: yes  Notes to clinic:  med is not delegated to NT to RF or refuse. This med was d/c'd 03/08/21 by PCP   Requested Prescriptions  Pending Prescriptions Disp Refills   baclofen (LIORESAL) 20 MG tablet [Pharmacy Med Name: BACLOFEN 20 MG TABLET] 270 tablet 1    Sig: TAKE 1 TABLET BY MOUTH THREE TIMES A DAY     Not Delegated - Analgesics:  Muscle Relaxants Failed - 05/19/2021  2:15 PM      Failed - This refill cannot be delegated      Passed - Valid encounter within last 6 months    Recent Outpatient Visits           2 weeks ago Alterations of sensations following cerebrovascular accident   [facility] Alfredia Ferguson, PA-C   2 months ago Alterations of sensations following cerebrovascular accident   [facility] Edwards, [place], PA-C   2 months ago Muscle spasm   [facility] Jacky Kindle, FNP   3 months ago Nerve pain   [facility] Merita Norton T, FNP   5 months ago Cerebrovascular accident (CVA) due to occlusion of left middle cerebral artery [facility])   [facility] Chrismon, Jodell Cipro, PA-C       Future Appointments             In 2 months Drubel, Lillia Abed, PA-C [employer]

## 2021-05-22 ENCOUNTER — Encounter: Payer: Self-pay | Admitting: Physician Assistant

## 2021-05-22 ENCOUNTER — Ambulatory Visit: Payer: Medicare HMO

## 2021-05-22 ENCOUNTER — Ambulatory Visit: Payer: Medicare HMO | Admitting: Physical Therapy

## 2021-05-22 ENCOUNTER — Encounter: Payer: Medicare HMO | Admitting: Speech Pathology

## 2021-05-23 ENCOUNTER — Encounter: Payer: Self-pay | Admitting: Physician Assistant

## 2021-05-23 ENCOUNTER — Other Ambulatory Visit: Payer: Self-pay | Admitting: Physician Assistant

## 2021-05-23 ENCOUNTER — Ambulatory Visit: Payer: Medicare HMO | Attending: Physician Assistant | Admitting: Occupational Therapy

## 2021-05-23 ENCOUNTER — Other Ambulatory Visit: Payer: Self-pay

## 2021-05-23 ENCOUNTER — Encounter: Payer: Self-pay | Admitting: Occupational Therapy

## 2021-05-23 DIAGNOSIS — M6281 Muscle weakness (generalized): Secondary | ICD-10-CM | POA: Insufficient documentation

## 2021-05-23 DIAGNOSIS — G8929 Other chronic pain: Secondary | ICD-10-CM | POA: Insufficient documentation

## 2021-05-23 DIAGNOSIS — M545 Low back pain, unspecified: Secondary | ICD-10-CM | POA: Diagnosis not present

## 2021-05-23 DIAGNOSIS — R482 Apraxia: Secondary | ICD-10-CM | POA: Insufficient documentation

## 2021-05-23 DIAGNOSIS — R262 Difficulty in walking, not elsewhere classified: Secondary | ICD-10-CM | POA: Diagnosis not present

## 2021-05-23 DIAGNOSIS — R2681 Unsteadiness on feet: Secondary | ICD-10-CM | POA: Insufficient documentation

## 2021-05-23 DIAGNOSIS — R278 Other lack of coordination: Secondary | ICD-10-CM | POA: Diagnosis not present

## 2021-05-23 DIAGNOSIS — I6930 Unspecified sequelae of cerebral infarction: Secondary | ICD-10-CM

## 2021-05-23 DIAGNOSIS — M5441 Lumbago with sciatica, right side: Secondary | ICD-10-CM

## 2021-05-23 DIAGNOSIS — Z8673 Personal history of transient ischemic attack (TIA), and cerebral infarction without residual deficits: Secondary | ICD-10-CM | POA: Insufficient documentation

## 2021-05-23 DIAGNOSIS — R4701 Aphasia: Secondary | ICD-10-CM | POA: Diagnosis not present

## 2021-05-23 DIAGNOSIS — I69320 Aphasia following cerebral infarction: Secondary | ICD-10-CM

## 2021-05-23 DIAGNOSIS — M47817 Spondylosis without myelopathy or radiculopathy, lumbosacral region: Secondary | ICD-10-CM

## 2021-05-23 DIAGNOSIS — R471 Dysarthria and anarthria: Secondary | ICD-10-CM | POA: Insufficient documentation

## 2021-05-23 NOTE — Therapy (Signed)
[facility] [address] [place], [place], [address] Phone: [phone]   Fax:  [phone]  Occupational Therapy Evaluation  Patient Details  Name: David Reeves MRN: [id number] Date of Birth: 04/20/1971 Referring Provider (OT): Alfredia Fergusonrubel, Lindsay   Encounter Date: 05/23/2021   OT End of Session - 05/23/21 1207     Visit Number 1    Number of Visits 24    Date for OT Re-Evaluation 08/15/21    Authorization Type Progress report period starting 05/23/2021    OT Start Time 0841    OT Stop Time 0945    OT Time Calculation (min) 64 min    Activity Tolerance Patient tolerated treatment well    Behavior During Therapy Flat affect             Past Medical History:  Diagnosis Date   Back pain 04/22/2012   Bone spur    Bulging disc 04/22/2012   Degenerative disc disease    Osteoarthritis    Panic anxiety syndrome    Stroke [facility](HCC) 10/2020   Taking multiple medications for chronic disease     Past Surgical History:  Procedure Laterality Date   CYSTECTOMY     head   HERNIA REPAIR Left 2006,2014   [facility]   TOE SURGERY Right 2007    There were no vitals filed for this visit.   Subjective Assessment - 05/23/21 1005     Subjective  Pt. reports that he would like to be able to hike again.    Patient is accompanied by: Family member    Pertinent History Pt. is a 51 y.o. who was diagnosed with a CVA on July 21st, 2022. Pt. [facility] After returning to home, Pt. sustained a fall in december of 2022, and was admitted to the hospital with COVI-19, and back pain from the fall. Since the most recent discharge, Pt. has been residing with the ex-wife until the Pt. is ready to return to independent living. Pt. PMHx includes: Bilateral LBP with right sided sciatica, Lumbosacral spondylosis myelopathy, closed compression Fx of L1. Pt. enjoys cooking, and riding motorcycles.    Currently in  Pain? Yes    Pain Score 5     Pain Location Back    Pain Orientation Right;Left;Lower    Pain Descriptors / Indicators Sore    Pain Type Chronic pain               [facility] OT Assessment - 05/23/21 0848       Assessment   Medical Diagnosis CVA    Referring Provider (OT) Alfredia Fergusonrubel, Lindsay    Onset Date/Surgical Date 11/09/21    Hand Dominance Right    Next MD Visit 08/03/2021      Precautions   Precautions None      Balance Screen   Has the patient fallen in the past 6 months Yes    How many times? 5    Has the patient had a decrease in activity level because of a fear of falling?  Yes    Is the patient reluctant to leave their home because of a fear of falling?  Yes      Home  Environment   Family/patient expects to be discharged to: Private residence    Living Arrangements Spouse/significant other    Type of Home House    Home Access Stairs    Home Layout One level    Alternate  Level Stairs - Number of Steps 2    Bathroom Shower/Tub Tub/Shower unit    Shower/tub characteristics Market researcherCurtain    Bathroom Toilet Standard    Home Equipment Shower seat;Toilet riser;Hand held shower head;Cane - single point    Lives With Spouse   Pt. reports ex-wife     Prior Function   Level of Independence Independent    Vocation On disability    Building surveyorVocation Requirements Machinist    Leisure Riding motorcycle      ADL   Eating/Feeding Independent   with left hand, uses the right hand for finger foods, bars   Grooming Independent   using the left hand   Lower Body Bathing Minimal assistance   Difficulty perfroming LUE bathing with the right   Upper Body Dressing Increased time   Difficulty with zippers, and buttons   Lower Body Dressing Increased time    Toilet Transfer Independent   Using the left hand   Tub/Shower Transfer Independent      IADL   Prior Level of Function Shopping Independent    Shopping Needs to be accompanied on any shopping trip    Prior Level of Function Light  Housekeeping Independent    Light Housekeeping Needs help with all home maintenance tasks    Prior Level of Function Meal Prep Independent    Meal Prep Able to complete simple warm meal prep    Prior Level of Function Best boyCommunity Mobility Independent    Community Mobility Relies on family or friends for transportation    Prior Level of Function Medication Managment Independent    Medication Management Takes responsibility if medication is prepared in advance in seperate dosage    Prior Level of Function Chemical engineerinancial Management Independent    Financial Management Manages financial matters independently (budgets, writes checks, pays rent, bills goes to bank), collects and keeps track of income      Written Expression   Dominant Hand Right    Handwriting Not legible   Unable to to hold a pen     Vision - History   Baseline Vision Wears glasses for distance only      Activity Tolerance   Activity Tolerance Tolerates 10-20 min activity with multiple rests      Cognition   Overall Cognitive Status Impaired/Different from baseline      Sensation   Light Touch Appears Intact    Proprioception Impaired by gross assessment      Coordination   Gross Motor Movements are Fluid and Coordinated No    Fine Motor Movements are Fluid and Coordinated No    Right 9 Hole Peg Test Unable to engage thumb in grasping the peg    Left 9 Hole Peg Test 30 sec.      Strength   Overall Strength Comments RUE strength: shoulder flexion, abduction 3/5, elbow flexion/extension 3+/5, wrist extension 3+/5 LUE 5/5      Hand Function   Right Hand Grip (lbs) 38    Right Hand Lateral Pinch 17 lbs    Right Hand 3 Point Pinch --   Unable to engage thumb   Left Hand Grip (lbs) 124    Left Hand Lateral Pinch 25 lbs    Left 3 point pinch 29 lbs                              OT Education - 05/23/21 1206     Education Details OT services, POC, goals,  RUE functioning    Person(s) Educated  Patient;Spouse    Methods Explanation;Demonstration    Comprehension Verbalized understanding              OT Short Term Goals - 05/23/21 1219       OT SHORT TERM GOAL #1   Title Pt. wil demonstrtae independence with HEPs    Baseline Eval: Pt. currently does not have one    Time 6    Period Weeks    Status New    Target Date 07/04/21               OT Long Term Goals - 05/23/21 1225       OT LONG TERM GOAL #1   Title Pt. will improve RUE strength by 2 mm grades to assist with ADLs, and IADLs,    Baseline Eval: Right shoulder flexion, abduction 3/5, elbow flexion, extension wrist extension 3+/5,    Time 12    Period Weeks    Status New    Target Date 08/15/21      OT LONG TERM GOAL #2   Title Pt. will improve right grip by 10 lbs to prepare for firmly holding objects for IADLs.    Baseline Eval: R: 38#, L: 124# (In 3rd dynamometer slot)    Time 12    Period Weeks    Status New    Target Date 08/15/21      OT LONG TERM GOAL #3   Title Pt. will improve right lateral pinch strength by 5# to assist with cutting food    Baseline Eval: R: 17, L: 25    Time 12    Period Weeks    Status New    Target Date 08/15/21      OT LONG TERM GOAL #4   Title Pt. will improve Right 3pt pinch by 2# to be able to hold/open items for cooking    Baseline Eval: R: Pt. unable to engage thumb L: 29#    Time 12    Period Weeks    Status New    Target Date 08/15/21      OT LONG TERM GOAL #5   Title Pt. will right hand [facility] skills to be able to independently manipulate buttons, and zippers.    Baseline Eval: Pt. has difficulty managing buttons,a nd zippers.    Time 12    Period Weeks    Status New    Target Date 08/15/21      Long Term Additional Goals   Additional Long Term Goals Yes      OT LONG TERM GOAL #6   Title Pt. will independently write his name    Baseline Eval: Pt. is unable to hold a pen    Time 12    Period Weeks    Status New    Target Date 05/24/21       OT LONG TERM GOAL #7   Title Pt. will improve FOTO score by 2 points to reflect funational improvement    Baseline Eval: FOT score 46 with TR score 52    Time 12    Period Weeks    Status New    Target Date 08/15/21                   Plan - 05/23/21 1208     Clinical Impression Statement Pt. is a 51 y.o. who was diagnosed with a CVA, with dominant RUE weakness, and Aphasia. Pt. has a history  of chronic LBP with a pain level of 5/10 today. Pt. reports that sitting aggravates the pain. Pt.'s FOTO score is 46 with a TR score of 52. Pt. presents with decreased RUE strength, grip strength, and 3pt. pinch strength, impaired RUE motor control, and impaired [facility] skills. These impairments make is difficulty to perform basic ADL, and IADL tasks. Pt. will benefit from OT services to work on improving and maximizing RUE functioning in order to be able to firm hold objects, manipulate items such as buttons and zippers, and handle utensils when preparing meals, cutting food, and performing home management tasks to prepare pt. to return to PLOF, and independent living.    OT Occupational Profile and History Detailed Assessment- Review of Records and additional review of physical, cognitive, psychosocial history related to current functional performance    Occupational performance deficits (Please refer to evaluation for details): ADL's;IADL's;Education    Rehab Potential Good    Clinical Decision Making Several treatment options, min-mod task modification necessary    Comorbidities Affecting Occupational Performance: May have comorbidities impacting occupational performance    Modification or Assistance to Complete Evaluation  Min-Moderate modification of tasks or assist with assess necessary to complete eval    OT Frequency 2x / week    OT Duration 12 weeks    OT Treatment/Interventions Self-care/ADL training;DME and/or AE instruction;Therapeutic exercise;Ultrasound;Neuromuscular  education;Therapeutic activities;Energy conservation;Moist Heat;Patient/family education;Splinting;Functional Mobility Training;Paraffin    Consulted and Agree with Plan of Care Patient             Patient will benefit from skilled therapeutic intervention in order to improve the following deficits and impairments:           Visit Diagnosis: No diagnosis found.    Problem List Patient Active Problem List   Diagnosis Date Noted   Alterations of sensations following cerebrovascular accident 03/08/2021   Aphasia as late effect of cerebrovascular accident (CVA) 03/08/2021   Muscle spasm 02/22/2021   Acute non-recurrent frontal sinusitis 02/22/2021   Chronic pain syndrome 01/23/2021   Primary hypertension 01/23/2021   Nerve pain 01/23/2021   Erectile dysfunction due to diseases classified elsewhere 01/23/2021   Cognitive dysfunction 01/23/2021   Scrotal edema 01/23/2021   Elevated LDL cholesterol level 01/23/2021   History of stroke with residual deficit 01/23/2021   Primary insomnia 01/23/2021   Mouth pain 01/23/2021   Laceration of right hand without foreign body    Closed compression fracture of L1 vertebra ([facility]) 05/24/2016   Lumbar stenosis with neurogenic claudication 11/01/2015   Chronic 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 11/08/2014   Hx of hemorrhoids 11/08/2014   AAA (abdominal aortic aneurysm) ([facility]) 09/22/2014   Chronic pain associated with significant psychosocial dysfunction 09/22/2014   Panic disorder 09/22/2014   AB (asthmatic bronchitis) 08/17/2014   Anxiety 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 08/17/2014   Backache 08/17/2014   Lumbosacral spondylosis without myelopathy 08/17/2014   Disorder of male genital organs 08/17/2014   Brash 08/17/2014   Low back pain 08/17/2014   Tendon nodule 08/17/2014   Episodic paroxysmal anxiety disorder 08/17/2014   Hernia, inguinal, right 08/17/2014   Fast heart beat 08/17/2014   Illness 08/17/2014    Inguinal hernia 10/14/2012   Olegario Messier, MS, OTR/L   Olegario Messier, OT 05/23/2021, 1:11 PM  [facility] MAIN [facility] SERVICES [address] [place], [place], [address] Phone: [phone]   Fax:  [phone]  Name: David Reeves MRN: [id number] Date of Birth: 03-01-1971

## 2021-05-24 ENCOUNTER — Ambulatory Visit: Payer: Medicare HMO

## 2021-05-24 ENCOUNTER — Encounter: Payer: Medicare HMO | Admitting: Speech Pathology

## 2021-05-24 ENCOUNTER — Ambulatory Visit: Payer: Medicare HMO | Admitting: Physical Therapy

## 2021-05-28 ENCOUNTER — Encounter: Payer: Self-pay | Admitting: Occupational Therapy

## 2021-05-28 ENCOUNTER — Ambulatory Visit: Payer: Medicare HMO | Admitting: Occupational Therapy

## 2021-05-28 ENCOUNTER — Other Ambulatory Visit: Payer: Self-pay

## 2021-05-28 DIAGNOSIS — G8929 Other chronic pain: Secondary | ICD-10-CM | POA: Diagnosis not present

## 2021-05-28 DIAGNOSIS — R262 Difficulty in walking, not elsewhere classified: Secondary | ICD-10-CM | POA: Diagnosis not present

## 2021-05-28 DIAGNOSIS — R4701 Aphasia: Secondary | ICD-10-CM | POA: Diagnosis not present

## 2021-05-28 DIAGNOSIS — Z8673 Personal history of transient ischemic attack (TIA), and cerebral infarction without residual deficits: Secondary | ICD-10-CM | POA: Diagnosis not present

## 2021-05-28 DIAGNOSIS — R2681 Unsteadiness on feet: Secondary | ICD-10-CM | POA: Diagnosis not present

## 2021-05-28 DIAGNOSIS — R278 Other lack of coordination: Secondary | ICD-10-CM

## 2021-05-28 DIAGNOSIS — M6281 Muscle weakness (generalized): Secondary | ICD-10-CM

## 2021-05-28 DIAGNOSIS — M545 Low back pain, unspecified: Secondary | ICD-10-CM | POA: Diagnosis not present

## 2021-05-28 DIAGNOSIS — R482 Apraxia: Secondary | ICD-10-CM | POA: Diagnosis not present

## 2021-05-28 NOTE — Therapy (Signed)
[place] MAIN [facility] SERVICES [address] [place], [place], [address] Phone: [phone]   Fax:  [phone]  Occupational Therapy Treatment  Patient Details  Name: David Reeves MRN: [id number] Date of Birth: May 27, 1970 Referring Provider (OT): [name]   Encounter Date: 05/28/2021   OT End of Session - 05/28/21 1310     Visit Number 2    Number of Visits 24    Date for OT Re-Evaluation 08/15/21    Authorization Type Progress report period starting 05/23/2021    OT Start Time 0840    OT Stop Time 0930    OT Time Calculation (min) 50 min    Activity Tolerance Patient tolerated treatment well    Behavior During Therapy Flat affect             Past Medical History:  Diagnosis Date   Back pain 04/22/2012   Bone spur    Bulging disc 04/22/2012   Degenerative disc disease    Osteoarthritis    Panic anxiety syndrome    Stroke [facility]) 10/2020   Taking multiple medications for chronic disease     Past Surgical History:  Procedure Laterality Date   CYSTECTOMY     head   HERNIA REPAIR Left 2006,2014   [facility]   TOE SURGERY Right 2007    There were no vitals filed for this visit.   Subjective Assessment - 05/28/21 1309     Subjective  Pt. reports that he is eager to have rehab start.    Patient is accompanied by: Family member    Pertinent History Pt. is a 51 y.o. who was diagnosed with a CVA on July 21st, 2022. Pt. completed several weeks of  inpatient rehab at [facility]. After returning to home, Pt. sustained a fall in december of 2022, and was admitted to the hospital with COVI-19, and back pain from the fall. Since the most recent discharge, Pt. has been residing with the ex-wife until the Pt. is ready to return to independent living. Pt. PMHx includes: Bilateral LBP with right sided sciatica, Lumbosacral spondylosis myelopathy, closed compression Fx of L1. Pt. enjoys cooking, and riding motorcycles.    Currently in Pain?  No/denies            OT TREATMENT    Therapeutic Exercise:  Pt. performed 2# dowel ex. for UE strengthening secondary to weakness. Bilateral shoulder flexion, chest press, circular patterns, and elbow flexion/extension were performed.  2# cuff weight for right elbow flexion/extension. Pt. worked on green theraputty ex. for gross gripping, while encouraging gross digit extension between each gross grip rep. Tan putty for lateral pinch, and formulating a roll. Pt. was provided with a visual handout HEP through [employer]. Pt. Attempted to perform gross gripping with a gross grip strengthener. Pt. worked on sustaining grip while grasping pegs and reaching at various heights, however, Pt. was only able to perform a few reps with support at his wrist. Pt. worked on the [employer] for 1 min. with constant support of the RUE, and an ACE wrap in place on at the handle.   Neuromuscular Re-education:  Pt. worked on formulating grasp patterns with his right hand, bringing the thumb to the 2nd, and 3rd digits in preparation for grasping large pegs. Pt. worked on maintaining the grasp while reaching up to place them into a container placed at his right side.   Pt. continues to present with impaired RUE strength, motor control, and [facility] skills. Pt. fatigues quickly with  strengthening reps, requiring rest breaks. Pt. requires increased time to align the thumb in position in preparation for grasping the pegs. Pt. continues to work on aligning the thumb into position in preparation for performing a 3pt. pinch grasp. Pt. required support with addition of an ACE wrap to maintain the hand onto the SCIFit handle. Pt. would benefit from attempting the the UBE next visit due to it's horizontal vs. the vertical handle of the SCIFit.  Pt. Continues to work on improving RUE functioning in order to work towards increasing engagement in, and maximizing independence with ADLs, and IADL tasks.                              OT Education - 05/28/21 1310     Education Details OT services    Person(s) Educated Patient;Spouse    Methods Explanation;Demonstration    Comprehension Verbalized understanding              OT Short Term Goals - 05/23/21 1219       OT SHORT TERM GOAL #1   Title Pt. wil demonstrtae independence with HEPs    Baseline Eval: Pt. currently does not have one    Time 6    Period Weeks    Status New    Target Date 07/04/21               OT Long Term Goals - 05/23/21 1225       OT LONG TERM GOAL #1   Title Pt. will improve RUE strength by 2 mm grades to assist with ADLs, and IADLs,    Baseline Eval: Right shoulder flexion, abduction 3/5, elbow flexion, extension wrist extension 3+/5,    Time 12    Period Weeks    Status New    Target Date 08/15/21      OT LONG TERM GOAL #2   Title Pt. will improve right grip by 10 lbs to prepare for firmly holding objects for IADLs.    Baseline Eval: R: 38#, L: 124# (In 3rd dynamometer slot)    Time 12    Period Weeks    Status New    Target Date 08/15/21      OT LONG TERM GOAL #3   Title Pt. will improve right lateral pinch strength by 5# to assist with cutting food    Baseline Eval: R: 17, L: 25    Time 12    Period Weeks    Status New    Target Date 08/15/21      OT LONG TERM GOAL #4   Title Pt. will improve Right 3pt pinch by 2# to be able to hold/open items for cooking    Baseline Eval: R: Pt. unable to engage thumb L: 29#    Time 12    Period Weeks    Status New    Target Date 08/15/21      OT LONG TERM GOAL #5   Title Pt. will right hand [facility] skills to be able to independently manipulate buttons, and zippers.    Baseline Eval: Pt. has difficulty managing buttons,a nd zippers.    Time 12    Period Weeks    Status New    Target Date 08/15/21      Long Term Additional Goals   Additional Long Term Goals Yes      OT LONG TERM GOAL #6   Title Pt. will independently write  his name  Baseline Eval: Pt. is unable to hold a pen    Time 12    Period Weeks    Status New    Target Date 05/24/21      OT LONG TERM GOAL #7   Title Pt. will improve FOTO score by 2 points to reflect funational improvement    Baseline Eval: FOT score 46 with TR score 52    Time 12    Period Weeks    Status New    Target Date 08/15/21                   Plan - 05/28/21 1311     Clinical Impression Statement Pt. continues to present with impaired RUE strength, motor control, and [facility] skills. Pt. fatigues quickly with strengthening reps, requiring rest breaks. Pt. requires increased time to align the thumb in position in preparation for grasping the pegs. Pt. continues to work on aligning the thumb into position in preparation for performing a 3pt. pinch grasp. Pt. required support with addition of an ACE wrap to maintain the hand onto the SCIFit handle. Pt. would benefit from attempting the the UBE next visit due to it's horizontal vs. the vertical handle of the SCIFit.  Pt. Continues to work on improving RUE functioning in order to work towards increasing engagement in, and maximizing independence with ADLs, and IADL tasks.    OT Occupational Profile and History Detailed Assessment- Review of Records and additional review of physical, cognitive, psychosocial history related to current functional performance    Occupational performance deficits (Please refer to evaluation for details): ADL's;IADL's;Education    Rehab Potential Good    Clinical Decision Making Several treatment options, min-mod task modification necessary    Comorbidities Affecting Occupational Performance: May have comorbidities impacting occupational performance    Modification or Assistance to Complete Evaluation  Min-Moderate modification of tasks or assist with assess necessary to complete eval    OT Frequency 2x / week    OT Duration 12 weeks    OT Treatment/Interventions Self-care/ADL training;DME and/or  AE instruction;Therapeutic exercise;Ultrasound;Neuromuscular education;Therapeutic activities;Energy conservation;Moist Heat;Patient/family education;Splinting;Functional Mobility Training;Paraffin    Consulted and Agree with Plan of Care Patient             Patient will benefit from skilled therapeutic intervention in order to improve the following deficits and impairments:           Visit Diagnosis: Muscle weakness (generalized)  Other lack of coordination    Problem List Patient Active Problem List   Diagnosis Date Noted   Alterations of sensations following cerebrovascular accident 03/08/2021   Aphasia as late effect of cerebrovascular accident (CVA) 03/08/2021   Muscle spasm 02/22/2021   Acute non-recurrent frontal sinusitis 02/22/2021   Chronic pain syndrome 01/23/2021   Primary hypertension 01/23/2021   Nerve pain 01/23/2021   Erectile dysfunction due to diseases classified elsewhere 01/23/2021   Cognitive dysfunction 01/23/2021   Scrotal edema 01/23/2021   Elevated LDL cholesterol level 01/23/2021   History of stroke with residual deficit 01/23/2021   Primary insomnia 01/23/2021   Mouth pain 01/23/2021   Laceration of right hand without foreign body    Closed compression fracture of L1 vertebra ([place]) 05/24/2016   Lumbar stenosis with neurogenic claudication 11/01/2015   Chronic 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 11/08/2014   Hx of hemorrhoids 11/08/2014   AAA (abdominal aortic aneurysm) ([place]) 09/22/2014   Chronic pain associated with significant psychosocial dysfunction 09/22/2014   Panic disorder 09/22/2014  AB (asthmatic bronchitis) 08/17/2014   Anxiety 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 08/17/2014   Backache 08/17/2014   Lumbosacral spondylosis without myelopathy 08/17/2014   Disorder of male genital organs 08/17/2014   Brash 08/17/2014   Low back pain 08/17/2014   Tendon nodule 08/17/2014   Episodic paroxysmal anxiety disorder  08/17/2014   Hernia, inguinal, right 08/17/2014   Fast heart beat 08/17/2014   Illness 08/17/2014   Inguinal hernia 10/14/2012   Harrel Carina, MS, OTR/L   Harrel Carina, OT 05/28/2021, 1:13 PM  [place] MAIN [facility] SERVICES [address] [place], [place], [address] Phone: [phone]   Fax:  [phone]  Name: David Reeves MRN: [id number] Date of Birth: [birth year]

## 2021-05-29 ENCOUNTER — Ambulatory Visit: Payer: Medicare HMO | Admitting: Physical Therapy

## 2021-05-30 ENCOUNTER — Other Ambulatory Visit: Payer: Self-pay

## 2021-05-30 ENCOUNTER — Encounter: Payer: Self-pay | Admitting: Physical Therapy

## 2021-05-30 ENCOUNTER — Ambulatory Visit: Payer: Medicare HMO | Admitting: Physical Therapy

## 2021-05-30 ENCOUNTER — Encounter: Payer: Self-pay | Admitting: Occupational Therapy

## 2021-05-30 ENCOUNTER — Ambulatory Visit: Payer: Medicare HMO | Admitting: Occupational Therapy

## 2021-05-30 DIAGNOSIS — R6889 Other general symptoms and signs: Secondary | ICD-10-CM | POA: Diagnosis not present

## 2021-05-30 DIAGNOSIS — R482 Apraxia: Secondary | ICD-10-CM | POA: Diagnosis not present

## 2021-05-30 DIAGNOSIS — R262 Difficulty in walking, not elsewhere classified: Secondary | ICD-10-CM

## 2021-05-30 DIAGNOSIS — G8929 Other chronic pain: Secondary | ICD-10-CM

## 2021-05-30 DIAGNOSIS — M6281 Muscle weakness (generalized): Secondary | ICD-10-CM | POA: Diagnosis not present

## 2021-05-30 DIAGNOSIS — M545 Low back pain, unspecified: Secondary | ICD-10-CM | POA: Diagnosis not present

## 2021-05-30 DIAGNOSIS — R2681 Unsteadiness on feet: Secondary | ICD-10-CM | POA: Diagnosis not present

## 2021-05-30 DIAGNOSIS — Z8673 Personal history of transient ischemic attack (TIA), and cerebral infarction without residual deficits: Secondary | ICD-10-CM | POA: Diagnosis not present

## 2021-05-30 DIAGNOSIS — R278 Other lack of coordination: Secondary | ICD-10-CM | POA: Diagnosis not present

## 2021-05-30 DIAGNOSIS — R4701 Aphasia: Secondary | ICD-10-CM | POA: Diagnosis not present

## 2021-05-30 NOTE — Therapy (Signed)
[place] [facility] MAIN [facility] SERVICES [address] [place], [place], [address] Phone: [phone]   Fax:  [phone]  Occupational Therapy Treatment  Patient Details  Name: David Reeves MRN: [id number] Date of Birth: 06/11/1970 Referring Provider (OT): Alfredia Ferguson   Encounter Date: 05/30/2021   OT End of Session - 05/30/21 1109     Visit Number 3    Number of Visits 24    Date for OT Re-Evaluation 08/15/21    Authorization Type Progress report period starting 05/23/2021    OT Start Time 0830    OT Stop Time 0915    OT Time Calculation (min) 45 min    Activity Tolerance Patient tolerated treatment well    Behavior During Therapy Flat affect             Past Medical History:  Diagnosis Date   Back pain 04/22/2012   Bone spur    Bulging disc 04/22/2012   Degenerative disc disease    Osteoarthritis    Panic anxiety syndrome    Stroke [facility]) 10/2020   Taking multiple medications for chronic disease     Past Surgical History:  Procedure Laterality Date   CYSTECTOMY     head   HERNIA REPAIR Left 2006,2014   [facility]   TOE SURGERY Right 2007    There were no vitals filed for this visit.   Subjective Assessment - 05/30/21 1108     Subjective  Pt. reports that he has PT today    Patient is accompanied by: Family member    Pertinent History Pt. is a 51 y.o. who was diagnosed with a CVA on July 21st, 2022. Pt. completed several weeks of  inpatient rehab at [facility]. After returning to home, Pt. sustained a fall in december of 2022, and was admitted to the hospital with COVI-19, and back pain from the fall. Since the most recent discharge, Pt. has been residing with the ex-wife until the Pt. is ready to return to independent living. Pt. PMHx includes: Bilateral LBP with right sided sciatica, Lumbosacral spondylosis myelopathy, closed compression Fx of L1. Pt. enjoys cooking, and riding motorcycles.    Currently in Pain? Yes    Pain Score  4     Pain Location Back    Pain Orientation Lower    Pain Descriptors / Indicators Aching;Sharp    Pain Type Chronic pain            OT TREATMENT     Therapeutic Exercise:   Pt. Worked on BUE strengthening, and reciprocal motion using the UBE while seated for 8 min. with minimal resistance. Constant monitoring was provided. Pt. Required proximal support at the right elbow, and wrist, along with ACE wrap.  Pt. performed gross gripping with a gross grip strengthener. Pt. worked on sustaining grip while grasping pegs and reaching at various heights. Pt. was able to clear the orange and red vertical pegs with the gripper set to  6.6# pt. required assist for stabilization the keep the gripper in a vertical position.    Neuromuscular Re-education:   Pt. Worked on grasping large clips from the tabletop surface with the right hand, and reaching to the far right to place them into a container, and to encourage ROM, and stretching. Pt. worked on formulating grasp patterns with his right hand, bringing the thumb to the 2nd, and 3rd digits in preparation for grasping the medium pegs. Pt. worked on maintaining the grasp while reaching up to place them  into a container placed to the right. Pt. worked on grasping [employer], and worked on [employer] them. Pt. was able to stack the discs in sets of 2's, and 3's, and required assist the stabilize the discs.   Pt. continues to present with impaired RUE strength, motor control, and [facility] skills. Pt. requires increased time to align the thumb in position in preparation for grasping the pegs. Pt. continues to work on aligning the thumb into position in preparation for performing a 3pt. pinch grasp. Pt. required support with addition of an ACE wrap to maintain the right hand on the UBE. Pt. Was able to clear more pegs from the board with the gripper. Pt. Was able to stack sets of 2, and 3 sets with the [place] style discs. Pt. Continues to work on  improving RUE functioning in order to work towards increasing engagement in, and maximizing independence with ADLs, and IADL tasks.                             OT Education - 05/30/21 1109     Education Details OT services    Person(s) Educated Patient;Spouse    Methods Explanation;Demonstration    Comprehension Verbalized understanding              OT Short Term Goals - 05/23/21 1219       OT SHORT TERM GOAL #1   Title Pt. wil demonstrtae independence with HEPs    Baseline Eval: Pt. currently does not have one    Time 6    Period Weeks    Status New    Target Date 07/04/21               OT Long Term Goals - 05/23/21 1225       OT LONG TERM GOAL #1   Title Pt. will improve RUE strength by 2 mm grades to assist with ADLs, and IADLs,    Baseline Eval: Right shoulder flexion, abduction 3/5, elbow flexion, extension wrist extension 3+/5,    Time 12    Period Weeks    Status New    Target Date 08/15/21      OT LONG TERM GOAL #2   Title Pt. will improve right grip by 10 lbs to prepare for firmly holding objects for IADLs.    Baseline Eval: R: 38#, L: 124# (In 3rd dynamometer slot)    Time 12    Period Weeks    Status New    Target Date 08/15/21      OT LONG TERM GOAL #3   Title Pt. will improve right lateral pinch strength by 5# to assist with cutting food    Baseline Eval: R: 17, L: 25    Time 12    Period Weeks    Status New    Target Date 08/15/21      OT LONG TERM GOAL #4   Title Pt. will improve Right 3pt pinch by 2# to be able to hold/open items for cooking    Baseline Eval: R: Pt. unable to engage thumb L: 29#    Time 12    Period Weeks    Status New    Target Date 08/15/21      OT LONG TERM GOAL #5   Title Pt. will right hand [facility] skills to be able to independently manipulate buttons, and zippers.    Baseline Eval: Pt. has difficulty managing buttons,a nd zippers.  Time 12    Period Weeks    Status New    Target Date  08/15/21      Long Term Additional Goals   Additional Long Term Goals Yes      OT LONG TERM GOAL #6   Title Pt. will independently write his name    Baseline Eval: Pt. is unable to hold a pen    Time 12    Period Weeks    Status New    Target Date 05/24/21      OT LONG TERM GOAL #7   Title Pt. will improve FOTO score by 2 points to reflect funational improvement    Baseline Eval: FOT score 46 with TR score 52    Time 12    Period Weeks    Status New    Target Date 08/15/21                   Plan - 05/30/21 1111     Clinical Impression Statement Pt. continues to present with impaired RUE strength, motor control, and [facility] skills. Pt. requires increased time to align the thumb in position in preparation for grasping the pegs. Pt. continues to work on aligning the thumb into position in preparation for performing a 3pt. pinch grasp. Pt. required support with addition of an ACE wrap to maintain the right hand on the UBE. Pt. Was able to clear more pegs from the board with the gripper. Pt. Was able to stack sets of 2, and 3 sets with the [place] style discs. Pt. Continues to work on improving RUE functioning in order to work towards increasing engagement in, and maximizing independence with ADLs, and IADL tasks.     OT Occupational Profile and History Detailed Assessment- Review of Records and additional review of physical, cognitive, psychosocial history related to current functional performance    Occupational performance deficits (Please refer to evaluation for details): ADL's;IADL's;Education    Rehab Potential Good    Clinical Decision Making Several treatment options, min-mod task modification necessary    Comorbidities Affecting Occupational Performance: May have comorbidities impacting occupational performance    Modification or Assistance to Complete Evaluation  Min-Moderate modification of tasks or assist with assess necessary to complete eval    OT Frequency 2x / week     OT Duration 12 weeks    OT Treatment/Interventions Self-care/ADL training;DME and/or AE instruction;Therapeutic exercise;Ultrasound;Neuromuscular education;Therapeutic activities;Energy conservation;Moist Heat;Patient/family education;Splinting;Functional Mobility Training;Paraffin    Consulted and Agree with Plan of Care Patient             Patient will benefit from skilled therapeutic intervention in order to improve the following deficits and impairments:           Visit Diagnosis: Muscle weakness (generalized)  Other lack of coordination    Problem List Patient Active Problem List   Diagnosis Date Noted   Alterations of sensations following cerebrovascular accident 03/08/2021   Aphasia as late effect of cerebrovascular accident (CVA) 03/08/2021   Muscle spasm 02/22/2021   Acute non-recurrent frontal sinusitis 02/22/2021   Chronic pain syndrome 01/23/2021   Primary hypertension 01/23/2021   Nerve pain 01/23/2021   Erectile dysfunction due to diseases classified elsewhere 01/23/2021   Cognitive dysfunction 01/23/2021   Scrotal edema 01/23/2021   Elevated LDL cholesterol level 01/23/2021   History of stroke with residual deficit 01/23/2021   Primary insomnia 01/23/2021   Mouth pain 01/23/2021   Laceration of right hand without foreign body    Closed compression  fracture of L1 vertebra ([facility]) 05/24/2016   Lumbar stenosis with neurogenic claudication 11/01/2015   Chronic 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 11/08/2014   Hx of hemorrhoids 11/08/2014   AAA (abdominal aortic aneurysm) ([facility]) 09/22/2014   Chronic pain associated with significant psychosocial dysfunction 09/22/2014   Panic disorder 09/22/2014   AB (asthmatic bronchitis) 08/17/2014   Anxiety 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 08/17/2014   Backache 08/17/2014   Lumbosacral spondylosis without myelopathy 08/17/2014   Disorder of male genital organs 08/17/2014   Brash 08/17/2014   Low  back pain 08/17/2014   Tendon nodule 08/17/2014   Episodic paroxysmal anxiety disorder 08/17/2014   Hernia, inguinal, right 08/17/2014   Fast heart beat 08/17/2014   Illness 08/17/2014   Inguinal hernia 10/14/2012   Olegario MessierElaine [name], MS, OTR/L   Olegario MessierElaine Samyia Motter, OT 05/30/2021, 11:13 AM  [place] [facility] [address] [place], [place], [id number] Phone: [phone]   Fax:  [phone]  Name: David Reeves MRN: [id number] Date of Birth: 1970-08-02

## 2021-05-31 ENCOUNTER — Ambulatory Visit (INDEPENDENT_AMBULATORY_CARE_PROVIDER_SITE_OTHER): Payer: Medicare HMO | Admitting: *Deleted

## 2021-05-31 ENCOUNTER — Ambulatory Visit: Payer: Medicare HMO | Admitting: Physical Therapy

## 2021-05-31 DIAGNOSIS — I6930 Unspecified sequelae of cerebral infarction: Secondary | ICD-10-CM

## 2021-05-31 DIAGNOSIS — G8929 Other chronic pain: Secondary | ICD-10-CM

## 2021-05-31 DIAGNOSIS — M47817 Spondylosis without myelopathy or radiculopathy, lumbosacral region: Secondary | ICD-10-CM

## 2021-05-31 DIAGNOSIS — M5441 Lumbago with sciatica, right side: Secondary | ICD-10-CM

## 2021-05-31 NOTE — Chronic Care Management (AMB) (Signed)
Chronic Care Management    Clinical Social Work Note  05/31/2021 Name: Clayton Dentremont MRN: [id number] DOB: [birth year]  Jakob Juszczyk is a 51 y.o. year old male who is a primary care patient of [name], [place]. The CCM team was consulted to assist the patient with chronic disease management and/or care coordination needs related to: [employer] .   Engaged with patient by telephone for follow up visit in response to provider referral for social work chronic care management and care coordination services.   Consent to Services:  The patient was given information about Chronic Care Management services, agreed to services, and gave verbal consent prior to initiation of services.  Please see initial visit note for detailed documentation.   Patient agreed to services and consent obtained.   Assessment: Review of patient past medical history, allergies, medications, and health status, including review of relevant consultants reports was performed today as part of a comprehensive evaluation and provision of chronic care management and care coordination services.     SDOH (Social Determinants of Health) assessments and interventions performed:    Advanced Directives Status: Not addressed in this encounter.  CCM Care Plan  Allergies  Allergen Reactions   Duloxetine     Other reaction(s): Other (See Comments) Increased temperature, sweating, uncontrolled shaking, aggressive thoughts   Amitriptyline Other (See Comments)    Urine retention   Buspirone Other (See Comments)    Kidney pain   Ciprofloxacin Other (See Comments)    hallucinations   Doxepin Other (See Comments)    "Didn't feel right"   Escitalopram Other (See Comments)    Agitation   Olanzapine    Penicillins Other (See Comments)    Unknown -- childhood reaction   Sertraline Other (See Comments)    Urine retention   Trazodone Other (See Comments)    abd pain, urine retention    Outpatient  Encounter Medications as of 05/31/2021  Medication Sig Note   acetaminophen (TYLENOL) 500 MG tablet Take 1,000 mg by mouth every 6 (six) hours as needed for mild pain.    amLODipine (NORVASC) 5 MG tablet Take 1 tablet (5 mg total) by mouth daily.    aspirin EC 81 MG tablet Take 81 mg by mouth daily. Swallow whole.    atorvastatin (LIPITOR) 80 MG tablet Take 1 tablet (80 mg total) by mouth daily.    Cholecalciferol (VITAMIN D3) 2000 units TABS Take by mouth.    diazepam (VALIUM) 5 MG tablet TAKE 1 TABLET (5 MG TOTAL) BY MOUTH EVERY 12 HOURS AS NEEDED FOR MUSCLE SPASM    donepezil (ARICEPT) 10 MG tablet Take 1 tablet (10 mg total) by mouth at bedtime.    meloxicam (MOBIC) 15 MG tablet Take 1 tablet (15 mg total) by mouth daily.    Multiple Vitamin (MULTIVITAMIN) tablet Take 1 tablet by mouth daily.    prazosin (MINIPRESS) 1 MG capsule Take 1 capsule (1 mg total) by mouth at bedtime. Please take 1 capsule at bedtime for three days, and after that can increase to 2 capsules at bedtime. Please do not take with Lorrin Mais, this is instead    [START ON 06/05/2021] tiZANidine (ZANAFLEX) 4 MG tablet Take 1 tablet (4 mg total) by mouth in the morning, at noon, in the evening, and at bedtime. 04/05/2021: New medication, due to start 06/05/2021   zolpidem (AMBIEN) 10 MG tablet Take 1 tablet (10 mg total) by mouth at bedtime. 05/16/2021: Patient reports medication is not effective/Requesting to change  No facility-administered encounter medications on file as of 05/31/2021.    Patient Active Problem List   Diagnosis Date Noted   Alterations of sensations following cerebrovascular accident 03/08/2021   Aphasia as late effect of cerebrovascular accident (CVA) 03/08/2021   Muscle spasm 02/22/2021   Acute non-recurrent frontal sinusitis 02/22/2021   Chronic pain syndrome 01/23/2021   Primary hypertension 01/23/2021   Nerve pain 01/23/2021   Erectile dysfunction due to diseases classified elsewhere 01/23/2021    Cognitive dysfunction 01/23/2021   Scrotal edema 01/23/2021   Elevated LDL cholesterol level 01/23/2021   History of stroke with residual deficit 01/23/2021   Primary insomnia 01/23/2021   Mouth pain 01/23/2021   Laceration of right hand without foreign body    Closed compression fracture of L1 vertebra ([place]) 05/24/2016   Lumbar stenosis with neurogenic claudication 11/01/2015   Chronic 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 11/08/2014   Hx of hemorrhoids 11/08/2014   AAA (abdominal aortic aneurysm) ([place]) 09/22/2014   Chronic pain associated with significant psychosocial dysfunction 09/22/2014   Panic disorder 09/22/2014   AB (asthmatic bronchitis) 08/17/2014   Anxiety 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 08/17/2014   Backache 08/17/2014   Lumbosacral spondylosis without myelopathy 08/17/2014   Disorder of male genital organs 08/17/2014   Brash 08/17/2014   Low back pain 08/17/2014   Tendon nodule 08/17/2014   Episodic paroxysmal anxiety disorder 08/17/2014   Hernia, inguinal, right 08/17/2014   Fast heart beat 08/17/2014   Illness 08/17/2014   Inguinal hernia 10/14/2012    Conditions to be addressed/monitored:  Cognitive dysfunction; Limited social support and transportation Care Plan : General Social Work (Adult)  Updates made by [employer], Baruch Gouty M, LCSW since 05/31/2021 12:00 AM     Problem: [facility] AMB "PATIENT-SPECIFIC PROBLEM"   Priority: Medium  Note:   CARE PLAN ENTRY (see longitudinal plan of care for additional care plan information)  Current Barriers:  Patient with  cognitive dysfunction  -history of stroke-in need of assistance with connection to community resources  Knowledge deficits and need for support, education and care coordination related to community resources support  Financial constraints related to current medical condition, Limited social support, Transportation, ADL IADL limitations, Cognitive Deficits, and Lacks knowledge of community  resource: Scientist, research (medical))  Over the next 90 days, patient will follow up with the Department of Social Services regarding the status of his Medicaid application as well as [employer] transportation to follow up on the coordination of transportation needs  Interventions provided by LCSW: This Education officer, museum contacted requesting assistance with coordination for transportation to medical appointments Continues to confirm improvement in coordinated services-per patient "everything is coming together now" Confirmed that patient has contacted [employer] and has enrolled for transportation to future medical appointments-patient continues to use this as a Building surveyor as well as ex-wife and significant other Followed up on Medicaid application-patient contacted by the Department of Social Services-application remains pending-patient encouraged to contact the Department of Social Services to follow up on status of pending application-patient confirmed that he has updated his address and will call them to follow up on status of application Collaboration phone call to the [place] office provided for financial assistance [phone] was determined that patient does not qualify for patient assistance as his medical bills have not reached $5,000.00. Patient will need to call to make a patient arrangement for current balance Referral for Psychiatry discussed, appointment with the pain clinic where a psychiatrist is a part of  the treatment-last appointment scheduled for 05/17/21 Follow up appointment with the [place] scheduled for 06/07/21 anticipating epidural shot in spine Patient confirmed residing now with ex-wife due to health concerns and is now receiving outpatient HH services(PT, OT, Speech through [facility]) Patient verbalized having no additional community resource needs at this time, patient provided with this social worker's contact information in  the event  that any community resource needs arise [phone]  Patient Self Care Activities & Deficits:  Patient is unable to independently navigate community resource options without care coordination support  Acknowledges deficits and is motivated to resolve concern  Patient is able to contact [employer] as discussed today Unable to independently transport self to medical appointments, has difficulty with the functioning of his right side due to stroke Self administers medications as prescribed  Please see past updates related to this goal by clicking on the "Past Updates" button in the selected goal         Follow Up Plan: Client will contact this social worker with any additional community resource needs       [place], [place] Worker  [place] Care Management [phone]

## 2021-05-31 NOTE — Patient Instructions (Signed)
Visit Information  Thank you for taking time to visit with me today. Please don't hesitate to contact me if I can be of assistance to you before our next scheduled telephone appointment.  Following are the goals we discussed today:   - call 211 when I need some help - follow-up on any referrals for help I am given - think ahead to make sure my need does not become an emergency - have a back-up plan - make a list of family or friends that I can call  -continue to follow upon status of Medicaid application with the Department of Social Services -continue to utilize [employer] to schedule rides to medical appointments  If you are experiencing a Mental Health or Behavioral Health Crisis or need someone to talk to, please call the Suicide and Crisis Lifeline: 988   Patient verbalizes understanding of instructions and care plan provided today and agrees to view in [place]. Active MyChart status confirmed with patient.    No further follow up required: patient to contact this Child psychotherapist with any additional community resource needs   [employer], LCSW Clinical Social Worker  [facility] Care Management [phone]

## 2021-05-31 NOTE — Therapy (Signed)
[facility] MAIN [facility] SERVICES [address] [place], [place], [address] Phone: [phone]   Fax:  [phone]  Physical Therapy Evaluation  Patient Details  Name: David Reeves MRN: [id number] Date of Birth: 02-26-1971 Referring Provider (PT): Alfredia Ferguson PA   Encounter Date: 05/30/2021   PT End of Session - 05/31/21 1009     Visit Number 1    Number of Visits 17    Date for PT Re-Evaluation 07/25/21    Authorization Type [employer] 05/30/21-07/25/21    PT Start Time 0932    PT Stop Time 1029    PT Time Calculation (min) 57 min    Equipment Utilized During Treatment Gait belt    Activity Tolerance Patient tolerated treatment well;Patient limited by pain    Behavior During Therapy Flat affect             Past Medical History:  Diagnosis Date   Back pain 04/22/2012   Bone spur    Bulging disc 04/22/2012   Degenerative disc disease    Osteoarthritis    Panic anxiety syndrome    Stroke [facility]) 10/2020   Taking multiple medications for chronic disease     Past Surgical History:  Procedure Laterality Date   CYSTECTOMY     head   HERNIA REPAIR Left 2006,2014   [facility]   TOE SURGERY Right 2007    There were no vitals filed for this visit.    Subjective Assessment - 05/30/21 0934     Subjective "I am wanting my right arm to get better including fine motor skill as well as improve my back pain. I am tired of my back hurting me, its always been an issue."    Pertinent History Pt. is a 51 y.o. who was diagnosed with a CVA on July 21st, 2022. Pt. completed several weeks of  inpatient rehab at [facility]. After returning to home. He was discharged in late August/early September and recieved home health PT. Pt. sustained a fall in december of 2022, and was admitted to the hospital with COVID-19, and Chronic back pain. He reports chronic back pain syndrome since 2012. Since the most recent discharge, Pt. has been residing with the  ex-wife until the Pt. is ready to return to independent living. He did recieve 2 weeks of home health after discharge in December 2022. He is now being referred to outpatient PT to address weakness from stroke and improve fine motor movement. He reports rarely getting numbness/tingling in RLE. PMHx includes: Bilateral LBP with right sided sciatica, Lumbosacral spondylosis myelopathy, closed compression Fx of L1. Has chronic insomnia. osteoarthritis, Panic anxiety syndrome (taking medication) Pt. enjoys cooking, and riding motorcycles. Patient is going to [facility] spine center for Epidural spinal injections on 06/07/21;    Limitations Sitting;Walking    How long can you sit comfortably? 20 min;    How long can you stand comfortably? 5-10 min;    How long can you walk comfortably? able to walk long ways (>500 feet)    Diagnostic tests MRI Nov 2022- Stable chronic burst L1 fracture, progressive disc degeneration with disc bulging  eccentric to the left at L3-4 and L4-5. spinal stenosis at L5-S1;    Patient Stated Goals "I Need all the help  I can get. I want to move around better and reduce my back pain."    Currently in Pain? Yes    Pain Score 4     Pain Location Back    Pain  Orientation Lower    Pain Descriptors / Indicators Aching;Sharp    Pain Type Chronic pain    Pain Radiating Towards low back center    Pain Onset More than a month ago    Pain Frequency Constant    Aggravating Factors  Sitting;    Pain Relieving Factors Standing/walking    Effect of Pain on Daily Activities Decreased sitting tolerance; unable to walk uphill                [facility] PT Assessment - 05/31/21 0001       Assessment   Medical Diagnosis CVA/low back pain    Referring Provider (PT) Alfredia Ferguson PA    Onset Date/Surgical Date 11/09/20    Hand Dominance Right    Next MD Visit 08/03/2021   going to spine center on 06/07/21 for Injection   Prior Therapy Denies any outpatient PT for stroke or  back pain;       Precautions   Precautions Fall    Required Braces or Orthoses Spinal Brace   wears daily, approximately 25%, when exercising; semi-rigid lumbar brace     Restrictions   Weight Bearing Restrictions No      Balance Screen   Has the patient fallen in the past 6 months No    Has the patient had a decrease in activity level because of a fear of falling?  No    Is the patient reluctant to leave their home because of a fear of falling?  No      Home Environment   Additional Comments At ex-wife's house: has 3 steps to enter no railing; single story home, sunken tub with shower with transfer seat; at patient's house: still renting; boarding house, has ramped entrance, tub shower, no railings,      Prior Function   Level of Independence Independent    Vocation On disability   due to  chronic back pain   Building surveyor    Leisure Riding motorcycle      Cognition   Overall Cognitive Status Impaired/Different from baseline      Observation/Other Assessments   Observations Pt does have aphasia- expressive, able to understand spoken language well; pt also slurs words and has a hard time enunciating/forming words;    Focus on Therapeutic Outcomes (FOTO)  40%      Sensation   Light Touch Appears Intact      Coordination   Heel Shin Test slow, but able to complete, does report increased back pain;      Posture/Postural Control   Posture Comments In standing, patient exhibits a lumbar shift to left, wearing lumbar corset, hard to determine hip height; reduced lumbar lordosis, minimal increase in kyphosis      AROM   Overall AROM Comments increased pain with repeated lumbar extension; ROM is WFL in lumbar spine with exception of minimal lumbar extension; decreased rotation to right side      Strength   Right Hip Flexion 4/5    Right Hip ABduction 4-/5    Left Hip Flexion 5/5    Right Ankle Dorsiflexion 4/5    Left Ankle Dorsiflexion 5/5      Palpation   Palpation comment  difficult to palpate as patient wearing semi-rigid corset to lumbar spine;      Special Tests    Special Tests Lumbar    Lumbar Tests Straight Leg Raise      Straight Leg Raise   Findings Negative    Side  --  bilaterally;     Transfers   Comments able to transfer sit<>stand without pushiing on arm rest      Ambulation/Gait   Gait Comments Pt ambulated without AD, does exhibit slower gait speed, slide increased base of support, minimal foot drag noted      6 Minute Walk- Baseline   6 Minute Walk- Baseline yes    BP (mmHg) 116/81    HR (bpm) 67    02 Sat (%RA) 97 %      6 Minute walk- Post Test   6 Minute Walk Post Test yes    BP (mmHg) 127/81    HR (bpm) 69    02 Sat (%RA) 96 %    Modified Borg Scale for Dyspnea 2- Mild shortness of breath      6 minute walk test results    Aerobic Endurance Distance Walked 935    Endurance additional comments without AD, no foot drag, reports increased back/LE pain at 6/10, less than age group norms and below community ambulator distances of 1000 feet      Standardized Balance Assessment   Standardized Balance Assessment Five Times Sit to Stand    Five times sit to stand comments  28.92 sec without UE assist (>10 sec indicates high risk for falls)      High Level Balance   High Level Balance Comments static standing balance is good, dynamic standing balance is fair                        Objective measurements completed on examination: See above findings.                PT Education - 05/31/21 1009     Education Details recommendation/plan of care    Person(s) Educated Patient    Methods Explanation    Comprehension Verbalized understanding              PT Short Term Goals - 05/31/21 1018       PT SHORT TERM GOAL #1   Title Patient will be adherent to HEP at least 3x a week to improve functional strength and balance for better safety at home.    Time 4    Period Weeks    Status New     Target Date 06/28/21      PT SHORT TERM GOAL #2   Title Patient (< 52 years old) will complete five times sit to stand test in < 15 seconds indicating an increased LE strength and improved balance.    Time 4    Period Weeks    Status New    Target Date 06/28/21               PT Long Term Goals - 05/31/21 1019       PT LONG TERM GOAL #1   Title Patient will increase six minute walk test distance to >1000 for progression to community ambulator and improve gait ability    Time 8    Period Weeks    Status New    Target Date 07/25/21      PT LONG TERM GOAL #2   Title Patient will ascend/descend 8 stairs without rail assist independently without loss of balance to improve ability to get in/out of home.    Time 8    Period Weeks    Status New    Target Date 07/25/21      PT LONG TERM GOAL #3  Title Patient will increase BLE gross strength to 4+/5 as to improve functional strength for independent gait, increased standing tolerance and increased ADL ability.    Time 8    Period Weeks    Status New    Target Date 07/25/21      PT LONG TERM GOAL #4   Title Patient will improve FOTO score to >50% to indicate improved functional mobility with less pain with ADLs.    Time 8    Period Weeks    Status New    Target Date 07/25/21      PT LONG TERM GOAL #5   Title Patient will report a worst pain of 4/10 in low back over last week to indicate improved tolerance with ADLs.    Time 8    Period Weeks    Status New    Target Date 07/26/21                    Plan - 05/31/21 1010     Clinical Impression Statement 51 yo Male s/p CVA in July 2022 with right sided weakness (UE>LE). He reports chronic low back pain which has been present for years. He reports his pain is worse with prolonged sitting and with prolonged standing. He reports spending most of his time laying in bed. While he is concerned about some of the weakness in his right leg, he reports his back pain as his  biggest issue limiting his mobility. [facility] and will be getting a steriod injection next week. Recent images of spine show chornic burst fracture of L1 (stable),  progressive disc degeneration with disc bulging eccentric to the left at L3-4 and L4-5. spinal stenosis at L5-S1; He exhibits a lumbar flexion preference. His pain is localized to spine with minimal radicular symptoms identified or reported. Patient does wear a lumbar corset daily especially when doing a lot of activity. He also exhibits some weakness in RLE which limits mobility. He tested as an increased risk for falls with slower 5 times sit<>Stand. he also ambulated less than community ambulator with 6 min walk test. Balance not formally assessed, but patient able to walk without AD with supervision with reciprocal gait pattern. he does ambulate and move at a slower pace likely from stroke. He is currently staying with his ex-wife as his apartment was not accessible. He is hoping to improve independence so that he can live on his own again. He would benefit from skilled PT Intervention to improve strength and help alleviate back pain. Patient is hoping to eventually get surgery for spine for pain relief. He is also recieving outpatient OT for UE weakness and poor fine motor control; He is scheduled for Speech therapy evaluation next week to address expressive aphasia.    Personal Factors and Comorbidities Comorbidity 3+;Past/Current Experience;Time since onset of injury/illness/exacerbation    Comorbidities PMHx includes: Bilateral LBP with right sided sciatica, Lumbosacral spondylosis myelopathy, closed compression Fx of L1. Has chronic insomnia. osteoarthritis, Panic anxiety syndrome (taking medication)    Examination-Activity Limitations Bend;Carry;Locomotion Level;Sit;Squat;Stairs;Stand;Transfers    Examination-Participation Restrictions Cleaning;Community Activity;Driving;Laundry;Meal  Prep;Occupation;Shop;Volunteer;Yard Work    Stability/Clinical Decision Making Stable/Uncomplicated    Clinical Decision Making Low    Rehab Potential Good    PT Frequency 2x / week    PT Duration 8 weeks    PT Treatment/Interventions Cryotherapy;Electrical Stimulation;Moist Heat;Traction;Gait training;Stair training;Functional mobility training;Therapeutic activities;Therapeutic exercise;Balance training;Neuromuscular re-education;Patient/family education;Manual techniques;Passive range of motion;Dry needling;Energy conservation  PT Next Visit Plan assess balance, initiate HEP    PT Home Exercise Plan will address next session;    Consulted and Agree with Plan of Care Patient             Patient will benefit from skilled therapeutic intervention in order to improve the following deficits and impairments:  Abnormal gait, Decreased endurance, Decreased mobility, Difficulty walking, Increased muscle spasms, Decreased range of motion, Decreased activity tolerance, Decreased strength, Postural dysfunction, Pain  Visit Diagnosis: Muscle weakness (generalized)  Chronic bilateral low back pain without sciatica  Difficulty in walking, not elsewhere classified  Unsteadiness on feet     Problem List Patient Active Problem List   Diagnosis Date Noted   Alterations of sensations following cerebrovascular accident 03/08/2021   Aphasia as late effect of cerebrovascular accident (CVA) 03/08/2021   Muscle spasm 02/22/2021   Acute non-recurrent frontal sinusitis 02/22/2021   Chronic pain syndrome 01/23/2021   Primary hypertension 01/23/2021   Nerve pain 01/23/2021   Erectile dysfunction due to diseases classified elsewhere 01/23/2021   Cognitive dysfunction 01/23/2021   Scrotal edema 01/23/2021   Elevated LDL cholesterol level 01/23/2021   History of stroke with residual deficit 01/23/2021   Primary insomnia 01/23/2021   Mouth pain 01/23/2021   Laceration of right hand without  foreign body    Closed compression fracture of L1 vertebra (HCC) 05/24/2016   Lumbar stenosis with neurogenic claudication 11/01/2015   Chronic 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 11/08/2014   Hx of hemorrhoids 11/08/2014   AAA (abdominal aortic aneurysm) (HCC) 09/22/2014   Chronic pain associated with significant psychosocial dysfunction 09/22/2014   Panic disorder 09/22/2014   AB (asthmatic bronchitis) 08/17/2014   Anxiety 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 08/17/2014   Backache 08/17/2014   Lumbosacral spondylosis without myelopathy 08/17/2014   Disorder of male genital organs 08/17/2014   Brash 08/17/2014   Low back pain 08/17/2014   Tendon nodule 08/17/2014   Episodic paroxysmal anxiety disorder 08/17/2014   Hernia, inguinal, right 08/17/2014   Fast heart beat 08/17/2014   Illness 08/17/2014   Inguinal hernia 10/14/2012    David Reeves, PT, DPT 05/31/2021, 10:23 AM  [facility] MAIN [facility] SERVICES [address] [place], [place], [address] Phone: [phone]   Fax:  [phone]  Name: David Reeves MRN: [id number] Date of Birth: 23-Aug-1970

## 2021-06-04 ENCOUNTER — Encounter: Payer: Self-pay | Admitting: Occupational Therapy

## 2021-06-04 ENCOUNTER — Ambulatory Visit: Payer: Medicare HMO | Admitting: Speech Pathology

## 2021-06-04 ENCOUNTER — Other Ambulatory Visit: Payer: Self-pay

## 2021-06-04 ENCOUNTER — Ambulatory Visit: Payer: Medicare HMO | Admitting: Occupational Therapy

## 2021-06-04 DIAGNOSIS — R4701 Aphasia: Secondary | ICD-10-CM | POA: Diagnosis not present

## 2021-06-04 DIAGNOSIS — R482 Apraxia: Secondary | ICD-10-CM

## 2021-06-04 DIAGNOSIS — Z8673 Personal history of transient ischemic attack (TIA), and cerebral infarction without residual deficits: Secondary | ICD-10-CM | POA: Diagnosis not present

## 2021-06-04 DIAGNOSIS — G8929 Other chronic pain: Secondary | ICD-10-CM | POA: Diagnosis not present

## 2021-06-04 DIAGNOSIS — R262 Difficulty in walking, not elsewhere classified: Secondary | ICD-10-CM | POA: Diagnosis not present

## 2021-06-04 DIAGNOSIS — M6281 Muscle weakness (generalized): Secondary | ICD-10-CM

## 2021-06-04 DIAGNOSIS — M545 Low back pain, unspecified: Secondary | ICD-10-CM | POA: Diagnosis not present

## 2021-06-04 DIAGNOSIS — R278 Other lack of coordination: Secondary | ICD-10-CM | POA: Diagnosis not present

## 2021-06-04 DIAGNOSIS — R471 Dysarthria and anarthria: Secondary | ICD-10-CM

## 2021-06-04 DIAGNOSIS — R6889 Other general symptoms and signs: Secondary | ICD-10-CM | POA: Diagnosis not present

## 2021-06-04 DIAGNOSIS — R2681 Unsteadiness on feet: Secondary | ICD-10-CM | POA: Diagnosis not present

## 2021-06-04 NOTE — Therapy (Signed)
[facility] MAIN [facility] SERVICES [address] [place], [place], [address] Phone: [phone]   Fax:  [phone]  Occupational Therapy Treatment  Patient Details  Name: David Reeves MRN: [id number] Date of Birth: [birth year] Referring Provider (OT): Alfredia Ferguson   Encounter Date: 06/04/2021   OT End of Session - 06/04/21 1108     Visit Number 4    Number of Visits 24    Date for OT Re-Evaluation 08/15/21    Authorization Type Progress report period starting 05/23/2021    OT Start Time 1017    OT Stop Time 1100    OT Time Calculation (min) 43 min    Activity Tolerance Patient tolerated treatment well    Behavior During Therapy Flat affect             Past Medical History:  Diagnosis Date   Back pain 04/22/2012   Bone spur    Bulging disc 04/22/2012   Degenerative disc disease    Osteoarthritis    Panic anxiety syndrome    Stroke [facility]) 10/2020   Taking multiple medications for chronic disease     Past Surgical History:  Procedure Laterality Date   CYSTECTOMY     head   HERNIA REPAIR Left 2006,2014   [facility]   TOE SURGERY Right 2007    There were no vitals filed for this visit.   Subjective Assessment - 06/04/21 1108     Subjective  Pt. reports that he has PT today    Patient is accompanied by: Family member    Pertinent History Pt. is a 51 y.o. who was diagnosed with a CVA on July 21st, 2022. Pt. completed several weeks of  inpatient rehab at [facility]. After returning to home, Pt. sustained a fall in december of 2022, and was admitted to the hospital with COVI-19, and back pain from the fall. Since the most recent discharge, Pt. has been residing with the ex-wife until the Pt. is ready to return to independent living. Pt. PMHx includes: Bilateral LBP with right sided sciatica, Lumbosacral spondylosis myelopathy, closed compression Fx of L1. Pt. enjoys cooking, and riding motorcycles.    Currently in Pain? No/denies             OT TREATMENT     Therapeutic Exercise:   Pt. performed 2# dowel ex. for UE strengthening secondary to weakness. Bilateral shoulder flexion, chest press, circular patterns were performed. Pt. worked on [employer], and reciprocal motion using the UBE while seated for 5 min. with minimal resistance. Constant monitoring was provided. Pt. required proximal support at the right elbow, and wrist, along with ACE wrap. Pt. Worked on RUE ROM functional reaching to grasp, and move Saebo rings, and move them through 4 horizontal rungs of progressively increasing heights. Pt. performed gross gripping with a gross grip strengthener. Pt. worked on sustaining grip while grasping pegs and reaching at various heights. Pt. was able to clear the orange and red vertical pegs with the gripper set to  6.6# Pt. required assist for stabilization to keep the gripper in a vertical position. Pt. worked on [employer] for thumb radial abduction, and adduction.   Pt. continues to present with impaired RUE strength, motor control, and [facility] skills. Pt. required support with addition of an ACE wrap to maintain grip on the horizontal UBE handle. Pt. continues to work on RUE ROM, functional reaching, and functional grasp patterns. Pt. continues to work on improving RUE functioning in order to work towards  increasing engagement in, and maximizing independence with ADLs, and IADL tasks.                          OT Education - 06/04/21 1108     Education Details OT services    Person(s) Educated Patient;Spouse    Methods Explanation;Demonstration    Comprehension Verbalized understanding              OT Short Term Goals - 05/23/21 1219       OT SHORT TERM GOAL #1   Title Pt. wil demonstrtae independence with HEPs    Baseline Eval: Pt. currently does not have one    Time 6    Period Weeks    Status New    Target Date 07/04/21               OT Long Term Goals - 05/23/21 1225        OT LONG TERM GOAL #1   Title Pt. will improve RUE strength by 2 mm grades to assist with ADLs, and IADLs,    Baseline Eval: Right shoulder flexion, abduction 3/5, elbow flexion, extension wrist extension 3+/5,    Time 12    Period Weeks    Status New    Target Date 08/15/21      OT LONG TERM GOAL #2   Title Pt. will improve right grip by 10 lbs to prepare for firmly holding objects for IADLs.    Baseline Eval: R: 38#, L: 124# (In 3rd dynamometer slot)    Time 12    Period Weeks    Status New    Target Date 08/15/21      OT LONG TERM GOAL #3   Title Pt. will improve right lateral pinch strength by 5# to assist with cutting food    Baseline Eval: R: 17, L: 25    Time 12    Period Weeks    Status New    Target Date 08/15/21      OT LONG TERM GOAL #4   Title Pt. will improve Right 3pt pinch by 2# to be able to hold/open items for cooking    Baseline Eval: R: Pt. unable to engage thumb L: 29#    Time 12    Period Weeks    Status New    Target Date 08/15/21      OT LONG TERM GOAL #5   Title Pt. will right hand [facility] skills to be able to independently manipulate buttons, and zippers.    Baseline Eval: Pt. has difficulty managing buttons,a nd zippers.    Time 12    Period Weeks    Status New    Target Date 08/15/21      Long Term Additional Goals   Additional Long Term Goals Yes      OT LONG TERM GOAL #6   Title Pt. will independently write his name    Baseline Eval: Pt. is unable to hold a pen    Time 12    Period Weeks    Status New    Target Date 05/24/21      OT LONG TERM GOAL #7   Title Pt. will improve FOTO score by 2 points to reflect funational improvement    Baseline Eval: FOT score 46 with TR score 52    Time 12    Period Weeks    Status New    Target Date 08/15/21  Plan - 06/04/21 1109     Clinical Impression Statement Pt. continues to present with impaired RUE strength, motor control, and [facility] skills. Pt. required support with  addition of an ACE wrap to maintain grip on the horizontal UBE handle. Pt. continues to work on RUE ROM, functional reaching, and functional grasp patterns. Pt. continues to work on improving RUE functioning in order to work towards increasing engagement in, and maximizing independence with ADLs, and IADL tasks.     OT Occupational Profile and History Detailed Assessment- Review of Records and additional review of physical, cognitive, psychosocial history related to current functional performance    Occupational performance deficits (Please refer to evaluation for details): ADL's;IADL's;Education    Rehab Potential Good    Clinical Decision Making Several treatment options, min-mod task modification necessary    Comorbidities Affecting Occupational Performance: May have comorbidities impacting occupational performance    Modification or Assistance to Complete Evaluation  Min-Moderate modification of tasks or assist with assess necessary to complete eval    OT Frequency 2x / week    OT Duration 12 weeks    OT Treatment/Interventions Self-care/ADL training;DME and/or AE instruction;Therapeutic exercise;Ultrasound;Neuromuscular education;Therapeutic activities;Energy conservation;Moist Heat;Patient/family education;Splinting;Functional Mobility Training;Paraffin    Consulted and Agree with Plan of Care Patient             Patient will benefit from skilled therapeutic intervention in order to improve the following deficits and impairments:           Visit Diagnosis: Muscle weakness (generalized)    Problem List Patient Active Problem List   Diagnosis Date Noted   Alterations of sensations following cerebrovascular accident 03/08/2021   Aphasia as late effect of cerebrovascular accident (CVA) 03/08/2021   Muscle spasm 02/22/2021   Acute non-recurrent frontal sinusitis 02/22/2021   Chronic pain syndrome 01/23/2021   Primary hypertension 01/23/2021   Nerve pain 01/23/2021   Erectile  dysfunction due to diseases classified elsewhere 01/23/2021   Cognitive dysfunction 01/23/2021   Scrotal edema 01/23/2021   Elevated LDL cholesterol level 01/23/2021   History of stroke with residual deficit 01/23/2021   Primary insomnia 01/23/2021   Mouth pain 01/23/2021   Laceration of right hand without foreign body    Closed compression fracture of L1 vertebra (HCC) 05/24/2016   Lumbar stenosis with neurogenic claudication 11/01/2015   Chronic 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 11/08/2014   Hx of hemorrhoids 11/08/2014   AAA (abdominal aortic aneurysm) (HCC) 09/22/2014   Chronic pain associated with significant psychosocial dysfunction 09/22/2014   Panic disorder 09/22/2014   AB (asthmatic bronchitis) 08/17/2014   Anxiety 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 08/17/2014   Backache 08/17/2014   Lumbosacral spondylosis without myelopathy 08/17/2014   Disorder of male genital organs 08/17/2014   Brash 08/17/2014   Low back pain 08/17/2014   Tendon nodule 08/17/2014   Episodic paroxysmal anxiety disorder 08/17/2014   Hernia, inguinal, right 08/17/2014   Fast heart beat 08/17/2014   Illness 08/17/2014   Inguinal hernia 10/14/2012   David Messier, MS, OTR/L   David Reeves, OT 06/04/2021, 11:10 AM  [facility] MAIN [facility] SERVICES [address] [place], [place], [address] Phone: [phone]   Fax:  [phone]  Name: David Reeves MRN: [id number] Date of Birth: 26-Feb-1971

## 2021-06-04 NOTE — Therapy (Signed)
[facility] MAIN [facility] SERVICES [address] [place], [place], [address] Phone: [phone]   Fax:  [phone]  Speech Language Pathology Evaluation  Patient Details  Name: David Reeves MRN: [id number] Date of Birth: 1971/03/26 Referring Provider (SLP): Alfredia Ferguson   Encounter Date: 06/04/2021   End of Session - 06/04/21 1045     Visit Number 1    Number of Visits 25    Date for SLP Re-Evaluation 08/27/21    Authorization Type [employer] H[employer]    Authorization Time Period 06/04/2021 thru 08/27/2021    Authorization - Visit Number 1    Progress Note Due on Visit 10    SLP Start Time 0900    SLP Stop Time  0950    SLP Time Calculation (min) 50 min    Activity Tolerance Patient tolerated treatment well             Past Medical History:  Diagnosis Date   Back pain 04/22/2012   Bone spur    Bulging disc 04/22/2012   Degenerative disc disease    Osteoarthritis    Panic anxiety syndrome    Stroke [facility]) 10/2020   Taking multiple medications for chronic disease     Past Surgical History:  Procedure Laterality Date   CYSTECTOMY     head   HERNIA REPAIR Left 2006,2014   [facility]   TOE SURGERY Right 2007    There were no vitals filed for this visit.   Subjective Assessment - 06/04/21 1039     Subjective "I miss speaking clearly"    Currently in Pain? No/denies                SLP Evaluation [facility] - 06/04/21 1039       SLP Visit Information   SLP Received On 06/04/21    Referring Provider (SLP) Alfredia Ferguson    Onset Date 10/21/2020    Medical Diagnosis L ACA and MCA Stroke      General Information   HPI Dragon Thrush is a 51 y.o. male with a PMH of HLD, insomnia, chronic back pain, schizophrenia, and anxiety/panic disorder who was admitted to [facility] on 11/08/2020 as code stroke, right sided weakness, right facial droop, and aphasia. Medical evaluation revealed ischemic stroke in the L ACA and  MCA territory. Follow up MRI revealed cortically-based multifocal SWI changes in the left frontal lobe are likely consistent with hemorrhagic conversion of stroke. Multilobe involvement suggests aortic or cardiac origin if this is thromboembolic. Etiology likely related to + Cocaine. Pt attended Inpatient Rehab and has completed 3 courses of HHST.    Behavioral/Cognition appropriate, cognition appears functional    Mobility Status ambulatory      Prior Functional Status   Cognitive/Linguistic Baseline Within functional limits    Type of Home House     Lives With Spouse;Significant other;Daughter    Available Support Family;Friend(s)      Cognition   Overall Cognitive Status Within Functional Limits for tasks assessed      Auditory Comprehension   Overall Auditory Comprehension Appears within functional limits for tasks assessed      Visual Recognition/Discrimination   Discrimination Within Function Limits      Reading Comprehension   Reading Status Impaired    Word level 76-100% accurate    Sentence Level 76-100% accurate    Paragraph Level 51-75% accurate    Functional Environmental (signs, name badge) Within functional limits      Expression  Primary Mode of Expression Verbal      Verbal Expression   Overall Verbal Expression Impaired    Initiation Impaired    Automatic Speech Name;Social Response    Level of Generative/Spontaneous Verbalization Sentence;Conversation    Repetition Impaired    Level of Impairment Word level;Phrase level    Naming No impairment    Pragmatics No impairment    Interfering Components Speech intelligibility    Effective Techniques Open ended questions    Non-Verbal Means of Communication Not applicable      Written Expression   Dominant Hand Right    Written Expression Unable to assess (comment)   RUE weakness     Oral Motor/Sensory Function   Overall Oral Motor/Sensory Function Impaired    Labial ROM Reduced right    Labial Symmetry  Abnormal symmetry right    Labial Strength Reduced Right    Labial Sensation Reduced Right    Labial Coordination Reduced    Lingual ROM Reduced right    Lingual Symmetry Abnormal symmetry right    Lingual Strength Reduced Right    Lingual Sensation Reduced Right    Lingual Coordination Reduced    Facial ROM Reduced right    Facial Symmetry Right droop    Facial Strength Reduced    Facial Sensation Reduced    Facial Coordination Reduced    Velum Within Functional Limits    Mandible Within Functional Limits      Motor Speech   Overall Motor Speech Impaired    Respiration Within functional limits    Phonation Normal    Resonance Within functional limits    Articulation Impaired    Level of Impairment Word    Intelligibility Intelligibility reduced    Word 50-74% accurate    Phrase 50-74% accurate    Sentence 25-49% accurate    Conversation 0-24% accurate    Motor Planning Impaired    Level of Impairment Word    Motor Speech Errors Aware;Groping for words;Inconsistent    Effective Techniques Slow rate    Phonation [facility]                             SLP Education - 06/04/21 1045     Education Details results of assessment, ST POC, pacing to improve rate of speech    Person(s) Educated Patient    Methods Explanation;Demonstration;Verbal cues    Comprehension Verbalized understanding;Need further instruction              SLP Short Term Goals - 06/04/21 1536       SLP SHORT TERM GOAL #1   Title Pt will use the speech intelligibility strategy of pacing to slow rate of speech at the simple sentence level in 8 out of 10 opportunities.    Baseline < 25% speech intelligiblity d/t fast rate of speech    Time 10    Period --   sessions   Status New      SLP SHORT TERM GOAL #2   Title Pt will produce fricatives in the initial, final and medial positions of words with 90% accuracy.    Baseline pt stops all fricatives    Time 10    Period --   sessions    Status New      SLP SHORT TERM GOAL #3   Title Pt will use speech intelligibility strategy of overarticulation to improve speech intelligibility to 75% at the simple sentence level.  Baseline < 25% speech intelligibili    Time 10    Period --   sessions   Status New      SLP SHORT TERM GOAL #4   Title Pt will answer WH questions after reading paragraph level passage with 75% accuracy.    Baseline new goal    Time 10    Period --   sessions   Status New              SLP Long Term Goals - 06/04/21 1540       SLP LONG TERM GOAL #1   Title Pt will use speech intelligibility stategies to achieve 50% speech intelligibility at the sentence level.    Baseline < 25% speech intelligibility    Time 12    Period Weeks    Status New    Target Date 08/27/21              Plan - 06/04/21 1046     Clinical Impression Statement Pt presents with moderate to severe dysarthria and motor planning impairment that results in < 25% speech intelligibility at the conversation level. Pt demonstrates overal mild to moderate right facial, labial and lingual weakness. Pt's speech is c/b fast rate and imprecise articulation as well as possible motor speech impairment. This results in phoneme and phrase repetiition and, stoping of fricatives. Pt responded well to initial instruction to slow rate of speech by using pacing with visual/tactile aid. Pt's word finding ability appears very functional as a result of previous courses of ST. Will continue to monitor as speech intelligibility improves. He also reports that paragraph level reading is difficulty with occasional deficits with writing. Skilled ST intervention is required to target these impairments to improve functional independence and decrease caregiver burden.    Speech Therapy Frequency 2x / week    Duration 12 weeks    Treatment/Interventions Language facilitation;Compensatory techniques;Cueing hierarchy;Internal/external aids;SLP instruction  and feedback;Patient/family education;Compensatory strategies    Potential to Achieve Goals Fair    Potential Considerations Other (comment);Severity of impairments   time post onset   Consulted and Agree with Plan of Care Patient             Patient will benefit from skilled therapeutic intervention in order to improve the following deficits and impairments:   Dysarthria and anarthria  Apraxia  Aphasia  H/O ischemic left MCA stroke  H/O ischemic left ACA stroke    Problem List Patient Active Problem List   Diagnosis Date Noted   Alterations of sensations following cerebrovascular accident 03/08/2021   Aphasia as late effect of cerebrovascular accident (CVA) 03/08/2021   Muscle spasm 02/22/2021   Acute non-recurrent frontal sinusitis 02/22/2021   Chronic pain syndrome 01/23/2021   Primary hypertension 01/23/2021   Nerve pain 01/23/2021   Erectile dysfunction due to diseases classified elsewhere 01/23/2021   Cognitive dysfunction 01/23/2021   Scrotal edema 01/23/2021   Elevated LDL cholesterol level 01/23/2021   History of stroke with residual deficit 01/23/2021   Primary insomnia 01/23/2021   Mouth pain 01/23/2021   Laceration of right hand without foreign body    Closed compression fracture of L1 vertebra (HCC) 05/24/2016   Lumbar stenosis with neurogenic claudication 11/01/2015   Chronic 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 11/08/2014   Hx of hemorrhoids 11/08/2014   AAA (abdominal aortic aneurysm) (HCC) 09/22/2014   Chronic pain associated with significant psychosocial dysfunction 09/22/2014   Panic disorder 09/22/2014   AB (asthmatic bronchitis) 08/17/2014  Anxiety 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 08/17/2014   Backache 08/17/2014   Lumbosacral spondylosis without myelopathy 08/17/2014   Disorder of male genital organs 08/17/2014   Brash 08/17/2014   Low back pain 08/17/2014   Tendon nodule 08/17/2014   Episodic paroxysmal anxiety  disorder 08/17/2014   Hernia, inguinal, right 08/17/2014   Fast heart beat 08/17/2014   Illness 08/17/2014   Inguinal hernia 10/14/2012   Lakayla Barrington B. Dreama Saa M.S., CCC-SLP, [facility] Speech-Language Pathologist Rehabilitation Services Office [phone]  Reuel [place], [place] 06/04/2021, 3:41 PM  [place] [facility] MAIN [facility] SERVICES [address] [place], [place], [address] Phone: [phone]   Fax:  [phone]  Name: Favion Rosencrance MRN: [id number] Date of Birth: 1970/12/24

## 2021-06-05 ENCOUNTER — Encounter: Payer: Medicare HMO | Admitting: Speech Pathology

## 2021-06-05 ENCOUNTER — Ambulatory Visit: Payer: Medicare HMO | Admitting: Physical Therapy

## 2021-06-06 ENCOUNTER — Ambulatory Visit: Payer: Medicare HMO | Admitting: Physical Therapy

## 2021-06-06 ENCOUNTER — Ambulatory Visit: Payer: Medicare HMO | Admitting: Occupational Therapy

## 2021-06-06 ENCOUNTER — Other Ambulatory Visit: Payer: Self-pay

## 2021-06-06 ENCOUNTER — Encounter: Payer: Medicare HMO | Admitting: Occupational Therapy

## 2021-06-06 ENCOUNTER — Telehealth: Payer: Self-pay | Admitting: Physician Assistant

## 2021-06-06 ENCOUNTER — Encounter: Payer: Self-pay | Admitting: Physical Therapy

## 2021-06-06 DIAGNOSIS — R262 Difficulty in walking, not elsewhere classified: Secondary | ICD-10-CM

## 2021-06-06 DIAGNOSIS — M6281 Muscle weakness (generalized): Secondary | ICD-10-CM

## 2021-06-06 DIAGNOSIS — R278 Other lack of coordination: Secondary | ICD-10-CM

## 2021-06-06 DIAGNOSIS — R482 Apraxia: Secondary | ICD-10-CM | POA: Diagnosis not present

## 2021-06-06 DIAGNOSIS — R2681 Unsteadiness on feet: Secondary | ICD-10-CM

## 2021-06-06 DIAGNOSIS — Z8673 Personal history of transient ischemic attack (TIA), and cerebral infarction without residual deficits: Secondary | ICD-10-CM | POA: Diagnosis not present

## 2021-06-06 DIAGNOSIS — M545 Low back pain, unspecified: Secondary | ICD-10-CM | POA: Diagnosis not present

## 2021-06-06 DIAGNOSIS — G8929 Other chronic pain: Secondary | ICD-10-CM | POA: Diagnosis not present

## 2021-06-06 DIAGNOSIS — R4701 Aphasia: Secondary | ICD-10-CM | POA: Diagnosis not present

## 2021-06-06 NOTE — Telephone Encounter (Signed)
Medication Refill - Medication: diazepam (VALIUM) 5 MG tablet  Has the patient contacted their pharmacy? No. Pt stated always contacts PCP  (Agent: If no, request that the patient contact the pharmacy for the refill. If patient does not wish to contact the pharmacy document the reason why and proceed with request.)   Preferred Pharmacy (with phone number or street name):  [facility] [address] [place] - [address] [place] [place] [address]  Phone: [phone] Fax: [phone]  Hours: Not open 24 hours    Has the patient been seen for an appointment in the last year OR does the patient have an upcoming appointment? No.  Agent: Please be advised that RX refills may take up to 3 business days. We ask that you follow-up with your pharmacy.

## 2021-06-06 NOTE — Therapy (Signed)
[facility] [facility] MAIN [facility] SERVICES [address] [place], [place], [address] Phone: [phone]   Fax:  [phone]  Physical Therapy Treatment  Patient Details  Name: David Reeves MRN: [id number] Date of Birth: 01/06/1971 Referring Provider (PT): Alfredia Ferguson PA   Encounter Date: 06/06/2021   PT End of Session - 06/06/21 0941     Visit Number 2    Number of Visits 17    Date for PT Re-Evaluation 07/25/21    Authorization Type [employer] 05/30/21-07/25/21    PT Start Time 0932    PT Stop Time 1015    PT Time Calculation (min) 43 min    Equipment Utilized During Treatment Gait belt    Activity Tolerance Patient tolerated treatment well;Patient limited by pain    Behavior During Therapy Flat affect             Past Medical History:  Diagnosis Date   Back pain 04/22/2012   Bone spur    Bulging disc 04/22/2012   Degenerative disc disease    Osteoarthritis    Panic anxiety syndrome    Stroke [facility]) 10/2020   Taking multiple medications for chronic disease     Past Surgical History:  Procedure Laterality Date   CYSTECTOMY     head   HERNIA REPAIR Left 2006,2014   [facility]   TOE SURGERY Right 2007    There were no vitals filed for this visit.   Subjective Assessment - 06/06/21 0939     Subjective Patient reports feeling tired after therapy this morning. He also reports chronic back pain; "I go for my steroid injection tomorrow."    Pertinent History Pt. is a 51 y.o. who was diagnosed with a CVA on July 21st, 2022. Pt. completed several weeks of  inpatient rehab at [facility]. After returning to home. He was discharged in late August/early September and recieved home health PT. Pt. sustained a fall in december of 2022, and was admitted to the hospital with COVID-19, and Chronic back pain. He reports chronic back pain syndrome since 2012. Since the most recent discharge, Pt. has been residing with the ex-wife until the Pt. is ready to  return to independent living. He did recieve 2 weeks of home health after discharge in December 2022. He is now being referred to outpatient PT to address weakness from stroke and improve fine motor movement. He reports rarely getting numbness/tingling in RLE. PMHx includes: Bilateral LBP with right sided sciatica, Lumbosacral spondylosis myelopathy, closed compression Fx of L1. Has chronic insomnia. osteoarthritis, Panic anxiety syndrome (taking medication) Pt. enjoys cooking, and riding motorcycles. Patient is going to [facility] spine center for Epidural spinal injections on 06/07/21;    Limitations Sitting;Walking    How long can you sit comfortably? 20 min;    How long can you stand comfortably? 5-10 min;    How long can you walk comfortably? able to walk long ways (>500 feet)    Diagnostic tests MRI Nov 2022- Stable chronic burst L1 fracture, progressive disc degeneration with disc bulging  eccentric to the left at L3-4 and L4-5. spinal stenosis at L5-S1;    Patient Stated Goals "I Need all the help  I can get. I want to move around better and reduce my back pain."    Currently in Pain? Yes    Pain Score 5     Pain Location Back    Pain Orientation Lower    Pain Descriptors / Indicators Aching;Tightness  Pain Type Chronic pain    Pain Onset More than a month ago    Pain Frequency Constant    Aggravating Factors  sitting    Pain Relieving Factors standing/walking    Effect of Pain on Daily Activities decreased sitting tolerance; unable to walk uphill    Multiple Pain Sites No               TREATMENT: Warm up on Crosstrainer, BLE only level 2 x3 min (Unbilled)   Patient hooklying on mat table: Lumbar trunk rotation with legs apart (windshield wiper) x10 reps Instructed patient in single knee to chest 20 sec hold, progressed to hip circles clockwise/counterclockwise x5 reps, LLE only, required assistance on RLE due to RUE hemiplegia Patient reports he often stretches BLE double knee  to chest  Instructed patient in side/side rock with double knee to chest to challenge lumbopelvic mobility, patient reports increased back pain, discontinued;   Patient instructed in LE strengthening: -SLR hip flexion x10 reps each LE -Suspended heel slides x10 reps each LE He required min VCs for proper positioning including to bend contralateral limb for less strain on low back. He reports moderate difficulty on RLE due to weakness but was able to exhibit good positioning and motor control;   Sidelying: RLE clamshells AROM 2x15 with min VCs for proper positioning to isolate gluteal activation;   Patient required supervision to transition to sitting with cues to push through LUE;  He reports back pain at 5/10 at end of session Instructed patient in gait around gym x150 feet to help alleviate symptoms;  Provided written HEP for better adherence, patient verbalized understanding; See patient instructions;                          PT Education - 06/06/21 0941     Education Details HEP/LE strengthening/lumbar ROM    Person(s) Educated Patient    Methods Explanation;Handout;Verbal cues    Comprehension Verbalized understanding;Returned demonstration;Verbal cues required;Need further instruction              PT Short Term Goals - 05/31/21 1018       PT SHORT TERM GOAL #1   Title Patient will be adherent to HEP at least 3x a week to improve functional strength and balance for better safety at home.    Time 4    Period Weeks    Status New    Target Date 06/28/21      PT SHORT TERM GOAL #2   Title Patient (< 52 years old) will complete five times sit to stand test in < 15 seconds indicating an increased LE strength and improved balance.    Time 4    Period Weeks    Status New    Target Date 06/28/21               PT Long Term Goals - 05/31/21 1019       PT LONG TERM GOAL #1   Title Patient will increase six minute walk test distance to >1000  for progression to community ambulator and improve gait ability    Time 8    Period Weeks    Status New    Target Date 07/25/21      PT LONG TERM GOAL #2   Title Patient will ascend/descend 8 stairs without rail assist independently without loss of balance to improve ability to get in/out of home.    Time 8  Period Weeks    Status New    Target Date 07/25/21      PT LONG TERM GOAL #3   Title Patient will increase BLE gross strength to 4+/5 as to improve functional strength for independent gait, increased standing tolerance and increased ADL ability.    Time 8    Period Weeks    Status New    Target Date 07/25/21      PT LONG TERM GOAL #4   Title Patient will improve FOTO score to >50% to indicate improved functional mobility with less pain with ADLs.    Time 8    Period Weeks    Status New    Target Date 07/25/21      PT LONG TERM GOAL #5   Title Patient will report a worst pain of 4/10 in low back over last week to indicate improved tolerance with ADLs.    Time 8    Period Weeks    Status New    Target Date 07/26/21                   Plan - 06/06/21 1052     Clinical Impression Statement Patient motivated and participated well within session. he was instructed in lumbar flexion ROM. During double knee to chest he does report increased back pain, discontinued. He was instructed in LE strengthening exercise, focusing on RLE. He does require min Vcs for proper positioning/exercise technique. patient does have difficulty with RLE, requiring cues for better motor control. He reports mild increase in back pain to 5/10 at end of session which was alleviated with walking around gym. Provided written HEP for adherence. Patient is scheduled for steriod injection tomorrow. Recommend patient take it easy and avoid heavy exercise for 24 hours following exercise. He would benefit from additional skilled PT Intervention to improve strength and mobility while reducing pain;     Personal Factors and Comorbidities Comorbidity 3+;Past/Current Experience;Time since onset of injury/illness/exacerbation    Comorbidities PMHx includes: Bilateral LBP with right sided sciatica, Lumbosacral spondylosis myelopathy, closed compression Fx of L1. Has chronic insomnia. osteoarthritis, Panic anxiety syndrome (taking medication)    Examination-Activity Limitations Bend;Carry;Locomotion Level;Sit;Squat;Stairs;Stand;Transfers    Examination-Participation Restrictions Cleaning;Community Activity;Driving;Laundry;Meal Prep;Occupation;Shop;Volunteer;Yard Work    Stability/Clinical Decision Making Stable/Uncomplicated    Rehab Potential Good    PT Frequency 2x / week    PT Duration 8 weeks    PT Treatment/Interventions Cryotherapy;Electrical Stimulation;Moist Heat;Traction;Gait training;Stair training;Functional mobility training;Therapeutic activities;Therapeutic exercise;Balance training;Neuromuscular re-education;Patient/family education;Manual techniques;Passive range of motion;Dry needling;Energy conservation    PT Next Visit Plan assess balance, initiate HEP    PT Home Exercise Plan will address next session;    Consulted and Agree with Plan of Care Patient             Patient will benefit from skilled therapeutic intervention in order to improve the following deficits and impairments:  Abnormal gait, Decreased endurance, Decreased mobility, Difficulty walking, Increased muscle spasms, Decreased range of motion, Decreased activity tolerance, Decreased strength, Postural dysfunction, Pain  Visit Diagnosis: Muscle weakness (generalized)  Other lack of coordination  Apraxia  Chronic bilateral low back pain without sciatica  Difficulty in walking, not elsewhere classified  Unsteadiness on feet     Problem List Patient Active Problem List   Diagnosis Date Noted   Alterations of sensations following cerebrovascular accident 03/08/2021   Aphasia as late effect of  cerebrovascular accident (CVA) 03/08/2021   Muscle spasm 02/22/2021   Acute non-recurrent frontal sinusitis 02/22/2021  Chronic pain syndrome 01/23/2021   Primary hypertension 01/23/2021   Nerve pain 01/23/2021   Erectile dysfunction due to diseases classified elsewhere 01/23/2021   Cognitive dysfunction 01/23/2021   Scrotal edema 01/23/2021   Elevated LDL cholesterol level 01/23/2021   History of stroke with residual deficit 01/23/2021   Primary insomnia 01/23/2021   Mouth pain 01/23/2021   Laceration of right hand without foreign body    Closed compression fracture of L1 vertebra (HCC) 05/24/2016   Lumbar stenosis with neurogenic claudication 11/01/2015   Chronic 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 11/08/2014   Hx of hemorrhoids 11/08/2014   AAA (abdominal aortic aneurysm) (HCC) 09/22/2014   Chronic pain associated with significant psychosocial dysfunction 09/22/2014   Panic disorder 09/22/2014   AB (asthmatic bronchitis) 08/17/2014   Anxiety 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 08/17/2014   Backache 08/17/2014   Lumbosacral spondylosis without myelopathy 08/17/2014   Disorder of male genital organs 08/17/2014   Brash 08/17/2014   Low back pain 08/17/2014   Tendon nodule 08/17/2014   Episodic paroxysmal anxiety disorder 08/17/2014   Hernia, inguinal, right 08/17/2014   Fast heart beat 08/17/2014   Illness 08/17/2014   Inguinal hernia 10/14/2012    David Reeves, PT, DPT 06/06/2021, 10:54 AM  [place] [facility] MAIN [facility] SERVICES [address] [place], [place], [address] Phone: [phone]   Fax:  [phone]  Name: David Reeves MRN: [id number] Date of Birth: 05-06-1970

## 2021-06-06 NOTE — Patient Instructions (Signed)
Access Code: [email or web address] URL: [email or web address] Date: 06/06/2021 Prepared by: Zettie Pho  Exercises Supine Lower Trunk Rotation - 2-3 x daily - 7 x weekly - 1 sets - 10 reps Supine Active Straight Leg Raise - 1 x daily - 7 x weekly - 1 sets - 10 reps Supine Heel Slides - 1 x daily - 7 x weekly - 1 sets - 10 reps Clamshell - 1 x daily - 7 x weekly - 2 sets - 15 reps

## 2021-06-06 NOTE — Therapy (Signed)
[place] [facility] MAIN [facility] SERVICES [address] [place], [place], [address] Phone: [phone]   Fax:  [phone]  Occupational Therapy Treatment  Patient Details  Name: David Reeves MRN: [id number] Date of Birth: January 29, 1971 Referring Provider (OT): Alfredia Ferguson   Encounter Date: 06/06/2021   OT End of Session - 06/06/21 0930     Visit Number 5    Number of Visits 24    Date for OT Re-Evaluation 08/15/21    Authorization Type Progress report period starting 05/23/2021    OT Start Time 0830    OT Stop Time 0915    OT Time Calculation (min) 45 min    Activity Tolerance Patient tolerated treatment well    Behavior During Therapy Flat affect             Past Medical History:  Diagnosis Date   Back pain 04/22/2012   Bone spur    Bulging disc 04/22/2012   Degenerative disc disease    Osteoarthritis    Panic anxiety syndrome    Stroke [facility]) 10/2020   Taking multiple medications for chronic disease     Past Surgical History:  Procedure Laterality Date   CYSTECTOMY     head   HERNIA REPAIR Left 2006,2014   [facility]   TOE SURGERY Right 2007    There were no vitals filed for this visit.   Subjective Assessment - 06/06/21 0928     Subjective  Pt. reports working his right hand alot yesteday at home,    Patient is accompanied by: Family member    Pertinent History Pt. is a 51 y.o. who was diagnosed with a CVA on July 21st, 2022. Pt. completed several weeks of  inpatient rehab at [facility]. After returning to home, Pt. sustained a fall in december of 2022, and was admitted to the hospital with COVI-19, and back pain from the fall. Since the most recent discharge, Pt. has been residing with the ex-wife until the Pt. is ready to return to independent living. Pt. PMHx includes: Bilateral LBP with right sided sciatica, Lumbosacral spondylosis myelopathy, closed compression Fx of L1. Pt. enjoys cooking, and riding motorcycles.    Currently  in Pain? Yes    Pain Score 4     Pain Location Back    Pain Orientation Lower    Pain Descriptors / Indicators Aching   Stiffness           OT TREATMENT     Therapeutic Exercise:   Pt. performed 2# dowel ex. for UE strengthening secondary to weakness. Bilateral shoulder flexion, chest press, circular patterns were performed. Pt. worked on [employer], and reciprocal motion using the UBE while seated for 5 min. with minimal resistance. Constant monitoring was provided. Pt. required proximal support at the right elbow, and wrist, along with ACE wrap, and nonskid gripper. Pt. performed gross gripping with a gross grip strengthener. Pt. worked on sustaining grip while grasping pegs and reaching at various heights. Pt. was able to clear the orange and red vertical pegs with the gripper set to  6.6#. Pt. required assist for stabilization to keep the gripper in a vertical position for several pegs. Pt. worked on grasping the pegs reaching up, and placing them into containers.  Pt. worked on [employer] for thumb radial abduction, and adduction.  Self-care:   Pt. worked on Mining engineer with the right hand to hold a pen with a built-up/adaptive handle. Pt. was able to hold, and stabilize the pen  in his hand, however was unable to formulate the the letters, and words.    Pt. continues to present with impaired RUE strength, motor control, and [facility] skills. Pt. required support with the addition of an ACE wrap to maintain grip on the horizontal UBE handle. Pt. Was unable to use the gripper for as many reps today, however presented with improved functional reaching with less compensation proximally. Pt. continues to work on RUE ROM, functional reaching, and functional grasp patterns. Pt. continues to work on improving RUE functioning in order to work towards increasing engagement in, and maximizing independence with ADLs, and IADL tasks.                           OT Education -  06/06/21 0930     Education Details OT services    Person(s) Educated Patient;Spouse    Methods Explanation;Demonstration    Comprehension Verbalized understanding              OT Short Term Goals - 05/23/21 1219       OT SHORT TERM GOAL #1   Title Pt. wil demonstrtae independence with HEPs    Baseline Eval: Pt. currently does not have one    Time 6    Period Weeks    Status New    Target Date 07/04/21               OT Long Term Goals - 05/23/21 1225       OT LONG TERM GOAL #1   Title Pt. will improve RUE strength by 2 mm grades to assist with ADLs, and IADLs,    Baseline Eval: Right shoulder flexion, abduction 3/5, elbow flexion, extension wrist extension 3+/5,    Time 12    Period Weeks    Status New    Target Date 08/15/21      OT LONG TERM GOAL #2   Title Pt. will improve right grip by 10 lbs to prepare for firmly holding objects for IADLs.    Baseline Eval: R: 38#, L: 124# (In 3rd dynamometer slot)    Time 12    Period Weeks    Status New    Target Date 08/15/21      OT LONG TERM GOAL #3   Title Pt. will improve right lateral pinch strength by 5# to assist with cutting food    Baseline Eval: R: 17, L: 25    Time 12    Period Weeks    Status New    Target Date 08/15/21      OT LONG TERM GOAL #4   Title Pt. will improve Right 3pt pinch by 2# to be able to hold/open items for cooking    Baseline Eval: R: Pt. unable to engage thumb L: 29#    Time 12    Period Weeks    Status New    Target Date 08/15/21      OT LONG TERM GOAL #5   Title Pt. will right hand [facility] skills to be able to independently manipulate buttons, and zippers.    Baseline Eval: Pt. has difficulty managing buttons,a nd zippers.    Time 12    Period Weeks    Status New    Target Date 08/15/21      Long Term Additional Goals   Additional Long Term Goals Yes      OT LONG TERM GOAL #6   Title Pt. will independently write his  name    Baseline Eval: Pt. is unable to hold a pen     Time 12    Period Weeks    Status New    Target Date 05/24/21      OT LONG TERM GOAL #7   Title Pt. will improve FOTO score by 2 points to reflect funational improvement    Baseline Eval: FOT score 46 with TR score 52    Time 12    Period Weeks    Status New    Target Date 08/15/21                   Plan - 06/06/21 0946     Clinical Impression Statement Pt. continues to present with impaired RUE strength, motor control, and [facility] skills. Pt. required support with the addition of an ACE wrap to maintain grip on the horizontal UBE handle. Pt. Was unable to use the gripper for as many reps today, however presented with improved functional reaching with less compensation proximally. Pt. continues to work on RUE ROM, functional reaching, and functional grasp patterns. Pt. continues to work on improving RUE functioning in order to work towards increasing engagement in, and maximizing independence with ADLs, and IADL tasks.     OT Occupational Profile and History Detailed Assessment- Review of Records and additional review of physical, cognitive, psychosocial history related to current functional performance    Occupational performance deficits (Please refer to evaluation for details): ADL's;IADL's;Education    Rehab Potential Good    Clinical Decision Making Several treatment options, min-mod task modification necessary    Comorbidities Affecting Occupational Performance: May have comorbidities impacting occupational performance    Modification or Assistance to Complete Evaluation  Min-Moderate modification of tasks or assist with assess necessary to complete eval    OT Frequency 2x / week    OT Duration 12 weeks    OT Treatment/Interventions Self-care/ADL training;DME and/or AE instruction;Therapeutic exercise;Ultrasound;Neuromuscular education;Therapeutic activities;Energy conservation;Moist Heat;Patient/family education;Splinting;Functional Mobility Training;Paraffin    Consulted  and Agree with Plan of Care Patient             Patient will benefit from skilled therapeutic intervention in order to improve the following deficits and impairments:           Visit Diagnosis: Muscle weakness (generalized)  Other lack of coordination    Problem List Patient Active Problem List   Diagnosis Date Noted   Alterations of sensations following cerebrovascular accident 03/08/2021   Aphasia as late effect of cerebrovascular accident (CVA) 03/08/2021   Muscle spasm 02/22/2021   Acute non-recurrent frontal sinusitis 02/22/2021   Chronic pain syndrome 01/23/2021   Primary hypertension 01/23/2021   Nerve pain 01/23/2021   Erectile dysfunction due to diseases classified elsewhere 01/23/2021   Cognitive dysfunction 01/23/2021   Scrotal edema 01/23/2021   Elevated LDL cholesterol level 01/23/2021   History of stroke with residual deficit 01/23/2021   Primary insomnia 01/23/2021   Mouth pain 01/23/2021   Laceration of right hand without foreign body    Closed compression fracture of L1 vertebra (HCC) 05/24/2016   Lumbar stenosis with neurogenic claudication 11/01/2015   Chronic 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 11/08/2014   Hx of hemorrhoids 11/08/2014   AAA (abdominal aortic aneurysm) (HCC) 09/22/2014   Chronic pain associated with significant psychosocial dysfunction 09/22/2014   Panic disorder 09/22/2014   AB (asthmatic bronchitis) 08/17/2014   Anxiety 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 08/17/2014   Backache 08/17/2014   Lumbosacral spondylosis without  myelopathy 08/17/2014   Disorder of male genital organs 08/17/2014   Brash 08/17/2014   Low back pain 08/17/2014   Tendon nodule 08/17/2014   Episodic paroxysmal anxiety disorder 08/17/2014   Hernia, inguinal, right 08/17/2014   Fast heart beat 08/17/2014   Illness 08/17/2014   Inguinal hernia 10/14/2012   David MessierElaine Kimbery Harwood, MS, OTR/L   David Reeves, OT 06/06/2021, 9:46  AM  [place] [facility] MAIN [facility] SERVICES [address] [place], [place], [address] Phone: [phone]   Fax:  [phone]  Name: David Reeves MRN: [id number] Date of Birth: 1971/04/18

## 2021-06-07 ENCOUNTER — Other Ambulatory Visit: Payer: Self-pay | Admitting: Physician Assistant

## 2021-06-07 ENCOUNTER — Ambulatory Visit: Payer: Medicare HMO | Admitting: Physical Therapy

## 2021-06-07 DIAGNOSIS — M5441 Lumbago with sciatica, right side: Secondary | ICD-10-CM

## 2021-06-07 DIAGNOSIS — S32010S Wedge compression fracture of first lumbar vertebra, sequela: Secondary | ICD-10-CM

## 2021-06-07 DIAGNOSIS — M48062 Spinal stenosis, lumbar region with neurogenic claudication: Secondary | ICD-10-CM | POA: Diagnosis not present

## 2021-06-07 DIAGNOSIS — G8929 Other chronic pain: Secondary | ICD-10-CM

## 2021-06-07 DIAGNOSIS — M47817 Spondylosis without myelopathy or radiculopathy, lumbosacral region: Secondary | ICD-10-CM

## 2021-06-07 MED ORDER — DIAZEPAM 5 MG PO TABS: ORAL_TABLET | ORAL | 0 refills | Status: DC

## 2021-06-10 ENCOUNTER — Other Ambulatory Visit: Payer: Self-pay | Admitting: Physician Assistant

## 2021-06-10 DIAGNOSIS — F410 Panic disorder [episodic paroxysmal anxiety] without agoraphobia: Secondary | ICD-10-CM

## 2021-06-11 ENCOUNTER — Ambulatory Visit: Payer: Medicare HMO | Admitting: Physical Therapy

## 2021-06-11 ENCOUNTER — Ambulatory Visit: Payer: Medicare HMO | Admitting: Occupational Therapy

## 2021-06-11 ENCOUNTER — Encounter: Payer: Self-pay | Admitting: Physical Therapy

## 2021-06-11 ENCOUNTER — Other Ambulatory Visit: Payer: Self-pay

## 2021-06-11 ENCOUNTER — Encounter: Payer: Self-pay | Admitting: Occupational Therapy

## 2021-06-11 DIAGNOSIS — M545 Low back pain, unspecified: Secondary | ICD-10-CM | POA: Diagnosis not present

## 2021-06-11 DIAGNOSIS — R4701 Aphasia: Secondary | ICD-10-CM | POA: Diagnosis not present

## 2021-06-11 DIAGNOSIS — M6281 Muscle weakness (generalized): Secondary | ICD-10-CM | POA: Diagnosis not present

## 2021-06-11 DIAGNOSIS — R2681 Unsteadiness on feet: Secondary | ICD-10-CM | POA: Diagnosis not present

## 2021-06-11 DIAGNOSIS — Z8673 Personal history of transient ischemic attack (TIA), and cerebral infarction without residual deficits: Secondary | ICD-10-CM | POA: Diagnosis not present

## 2021-06-11 DIAGNOSIS — R262 Difficulty in walking, not elsewhere classified: Secondary | ICD-10-CM

## 2021-06-11 DIAGNOSIS — G8929 Other chronic pain: Secondary | ICD-10-CM | POA: Diagnosis not present

## 2021-06-11 DIAGNOSIS — R278 Other lack of coordination: Secondary | ICD-10-CM

## 2021-06-11 DIAGNOSIS — R6889 Other general symptoms and signs: Secondary | ICD-10-CM | POA: Diagnosis not present

## 2021-06-11 DIAGNOSIS — R482 Apraxia: Secondary | ICD-10-CM | POA: Diagnosis not present

## 2021-06-11 NOTE — Therapy (Signed)
[place] MAIN [facility] SERVICES [address] [place], [place], [address] Phone: [phone]   Fax:  [phone]  Physical Therapy Treatment  Patient Details  Name: David Reeves MRN: [id number] Date of Birth: 05-03-1970 Referring Provider (PT): [name] PA   Encounter Date: 06/11/2021   PT End of Session - 06/11/21 1016     Visit Number 3    Number of Visits 17    Date for PT Re-Evaluation 07/25/21    Authorization Type [employer] 05/30/21-07/25/21    PT Start Time [id number]    PT Stop Time 1015    PT Time Calculation (min) 41 min    Equipment Utilized During Treatment Gait belt    Activity Tolerance Patient tolerated treatment well;Patient limited by pain    Behavior During Therapy Flat affect             Past Medical History:  Diagnosis Date   Back pain 04/22/2012   Bone spur    Bulging disc 04/22/2012   Degenerative disc disease    Osteoarthritis    Panic anxiety syndrome    Stroke [facility]) 10/2020   Taking multiple medications for chronic disease     Past Surgical History:  Procedure Laterality Date   CYSTECTOMY     head   HERNIA REPAIR Left 2006,2014   [facility]   TOE SURGERY Right 2007    There were no vitals filed for this visit.   Subjective Assessment - 06/11/21 1018     Subjective Patient reports getting injection in back last Friday. Reports taking it easy over the weekend. No new complaints;    Pertinent History Pt. is a 51 y.o. who was diagnosed with a CVA on July 21st, 2022. Pt. completed several weeks of  inpatient rehab at [facility]. After returning to home. He was discharged in late August/early September and recieved home health PT. Pt. sustained a fall in december of 2022, and was admitted to the hospital with COVID-19, and Chronic back pain. He reports chronic back pain syndrome since 2012. Since the most recent discharge, Pt. has been residing with the ex-wife until the Pt. is ready to return to  independent living. He did recieve 2 weeks of home health after discharge in December 2022. He is now being referred to outpatient PT to address weakness from stroke and improve fine motor movement. He reports rarely getting numbness/tingling in RLE. PMHx includes: Bilateral LBP with right sided sciatica, Lumbosacral spondylosis myelopathy, closed compression Fx of L1. Has chronic insomnia. osteoarthritis, Panic anxiety syndrome (taking medication) Pt. enjoys cooking, and riding motorcycles. Patient is going to [facility] for Epidural spinal injections on 06/07/21;    Limitations Sitting;Walking    How long can you sit comfortably? 20 min;    How long can you stand comfortably? 5-10 min;    How long can you walk comfortably? able to walk long ways (>500 feet)    Diagnostic tests MRI Nov 2022- Stable chronic burst L1 fracture, progressive disc degeneration with disc bulging  eccentric to the left at L3-4 and L4-5. spinal stenosis at L5-S1;    Patient Stated Goals "I Need all the help  I can get. I want to move around better and reduce my back pain."    Currently in Pain? Yes    Pain Score 5     Pain Location Back    Pain Orientation Lower    Pain Descriptors / Indicators Aching;Tightness    Pain Type Chronic  pain    Pain Onset More than a month ago    Pain Frequency Intermittent    Aggravating Factors  prolonged sitting    Pain Relieving Factors standing/walking    Effect of Pain on Daily Activities decreased activity tolerance;    Multiple Pain Sites No                    TREATMENT: Warm up on Crosstrainer, BLE only level 2 x3 min (Unbilled)     Patient hooklying on mat table with moist heat to low back: Lumbar trunk rotation with legs apart (windshield wiper) x10 reps Instructed patient in single knee to chest 20 sec hold, progressed to hip circles clockwise/counterclockwise x5 reps, LLE only, required assistance on RLE due to RUE hemiplegia; Required AAROM on RLE x5 reps  each direction;      Patient instructed in LE strengthening: Hooklying BLE Bridges x10 reps with good motor control and positioning;  -SLR hip flexion x10 reps each LE, reports moderate difficulty when lifting RLE, being able to only do partial ROM, able to exhibit good motor control and positioning;  -Suspended heel slides x10 reps each LE, reports less difficulty compared to SLR;  He required min VCs for proper positioning including to bend contralateral limb for less strain on low back. He denies any increase in back pain during exercise;  -Hooklying with green tband around BLE:  -hip abduction/ER x15 reps each LE with cues to keep   -Feet apart with tension on band, BLE bridges x10 reps, no pain reported  -alternate march x10 reps each LE with good positioning/ROM  Patient tolerated hooklying exercise well without increase in back pain.   Patient required supervision to transition to sitting with cues to push through LUE;  Seated on mat table: RLE hamstring curl green tband x15 reps, moderate difficulty but no increase in back pain;   Leg press: BLE 70# x10 reps, moderate difficulty able to exhibit good control RLE only 40# x10 reps, reports increased ease with increased repetition, likely from increased flexibility;   He reports back pain at 5/10 at end of session Instructed patient in gait around gym x150 feet to help alleviate symptoms;   Reinforced HEP for better adherence. Patient reports. "I feel good." At end of session;                             PT Short Term Goals - 05/31/21 1018       PT SHORT TERM GOAL #1   Title Patient will be adherent to HEP at least 3x a week to improve functional strength and balance for better safety at home.    Time 4    Period Weeks    Status New    Target Date 06/28/21      PT SHORT TERM GOAL #2   Title Patient (< 24 years old) will complete five times sit to stand test in < 15 seconds indicating an increased LE  strength and improved balance.    Time 4    Period Weeks    Status New    Target Date 06/28/21               PT Long Term Goals - 05/31/21 1019       PT LONG TERM GOAL #1   Title Patient will increase six minute walk test distance to >1000 for progression to community ambulator and improve gait ability  Time 8    Period Weeks    Status New    Target Date 07/25/21      PT LONG TERM GOAL #2   Title Patient will ascend/descend 8 stairs without rail assist independently without loss of balance to improve ability to get in/out of home.    Time 8    Period Weeks    Status New    Target Date 07/25/21      PT LONG TERM GOAL #3   Title Patient will increase BLE gross strength to 4+/5 as to improve functional strength for independent gait, increased standing tolerance and increased ADL ability.    Time 8    Period Weeks    Status New    Target Date 07/25/21      PT LONG TERM GOAL #4   Title Patient will improve FOTO score to >50% to indicate improved functional mobility with less pain with ADLs.    Time 8    Period Weeks    Status New    Target Date 07/25/21      PT LONG TERM GOAL #5   Title Patient will report a worst pain of 4/10 in low back over last week to indicate improved tolerance with ADLs.    Time 8    Period Weeks    Status New    Target Date 07/26/21                   Plan - 06/11/21 1016     Clinical Impression Statement Patient motivated and participated well within session. He was instructed in advanced LE strengthening exercise in hooklying position for better tolerance. Patient does require min VCs for proper positioning/exercise technique. he continues to report moderate difficulty with majority of exercise, although denies any increase in back pain. He reports less stiffness at end of session. Reinforced HEP. Patient would benefit from additional skilled PT Intervention to improve strength and mobility;    Personal Factors and Comorbidities  Comorbidity 3+;Past/Current Experience;Time since onset of injury/illness/exacerbation    Comorbidities PMHx includes: Bilateral LBP with right sided sciatica, Lumbosacral spondylosis myelopathy, closed compression Fx of L1. Has chronic insomnia. osteoarthritis, Panic anxiety syndrome (taking medication)    Examination-Activity Limitations Bend;Carry;Locomotion Level;Sit;Squat;Stairs;Stand;Transfers    Examination-Participation Restrictions Cleaning;Community Activity;Driving;Laundry;Meal Prep;Occupation;Shop;Volunteer;Yard Work    Stability/Clinical Decision Making Stable/Uncomplicated    Rehab Potential Good    PT Frequency 2x / week    PT Duration 8 weeks    PT Treatment/Interventions Cryotherapy;Electrical Stimulation;Moist Heat;Traction;Gait training;Stair training;Functional mobility training;Therapeutic activities;Therapeutic exercise;Balance training;Neuromuscular re-education;Patient/family education;Manual techniques;Passive range of motion;Dry needling;Energy conservation    PT Next Visit Plan assess balance, initiate HEP    PT Home Exercise Plan will address next session;    Consulted and Agree with Plan of Care Patient             Patient will benefit from skilled therapeutic intervention in order to improve the following deficits and impairments:  Abnormal gait, Decreased endurance, Decreased mobility, Difficulty walking, Increased muscle spasms, Decreased range of motion, Decreased activity tolerance, Decreased strength, Postural dysfunction, Pain  Visit Diagnosis: Muscle weakness (generalized)  Other lack of coordination  Apraxia  Chronic bilateral low back pain without sciatica  Difficulty in walking, not elsewhere classified  Unsteadiness on feet     Problem List Patient Active Problem List   Diagnosis Date Noted   Alterations of sensations following cerebrovascular accident 03/08/2021   Aphasia as late effect of cerebrovascular accident (CVA) 03/08/2021  Muscle spasm 02/22/2021   Acute non-recurrent frontal sinusitis 02/22/2021   Chronic pain syndrome 01/23/2021   Primary hypertension 01/23/2021   Nerve pain 01/23/2021   Erectile dysfunction due to diseases classified elsewhere 01/23/2021   Cognitive dysfunction 01/23/2021   Scrotal edema 01/23/2021   Elevated LDL cholesterol level 01/23/2021   History of stroke with residual deficit 01/23/2021   Primary insomnia 01/23/2021   Mouth pain 01/23/2021   Laceration of right hand without foreign body    Closed compression fracture of L1 vertebra ([place]) 05/24/2016   Lumbar stenosis with neurogenic claudication 11/01/2015   Chronic 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 11/08/2014   Hx of hemorrhoids 11/08/2014   AAA (abdominal aortic aneurysm) ([place]) 09/22/2014   Chronic pain associated with significant psychosocial dysfunction 09/22/2014   Panic disorder 09/22/2014   AB (asthmatic bronchitis) 08/17/2014   Anxiety 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 08/17/2014   Backache 08/17/2014   Lumbosacral spondylosis without myelopathy 08/17/2014   Disorder of male genital organs 08/17/2014   Brash 08/17/2014   Low back pain 08/17/2014   Tendon nodule 08/17/2014   Episodic paroxysmal anxiety disorder 08/17/2014   Hernia, inguinal, right 08/17/2014   Fast heart beat 08/17/2014   Illness 08/17/2014   Inguinal hernia 10/14/2012    Makyah Lavigne, PT, DPT 06/11/2021, 10:19 AM  [place], [place], [address] Phone: [phone]   Fax:  [phone]  Name: Franciscus Zisk MRN: [id number] Date of Birth: 09-Sep-1970

## 2021-06-11 NOTE — Therapy (Signed)
[place] [facility] MAIN [facility] SERVICES [address] [place], [place], [address] Phone: [phone]   Fax:  [phone]  Occupational Therapy Treatment  Patient Details  Name: David Reeves MRN: [id number] Date of Birth: 05-28-1970 Referring Provider (OT): Alfredia Ferguson   Encounter Date: 06/11/2021   OT End of Session - 06/11/21 0941     Visit Number 7    Number of Visits 24    Date for OT Re-Evaluation 08/15/21    Authorization Type Progress report period starting 05/23/2021    OT Start Time 0848    OT Stop Time 0915    OT Time Calculation (min) 27 min    Activity Tolerance Patient tolerated treatment well    Behavior During Therapy Flat affect             Past Medical History:  Diagnosis Date   Back pain 04/22/2012   Bone spur    Bulging disc 04/22/2012   Degenerative disc disease    Osteoarthritis    Panic anxiety syndrome    Stroke [facility]) 10/2020   Taking multiple medications for chronic disease     Past Surgical History:  Procedure Laterality Date   CYSTECTOMY     head   HERNIA REPAIR Left 2006,2014   [facility]   TOE SURGERY Right 2007    There were no vitals filed for this visit.   Subjective Assessment - 06/11/21 0940     Subjective  Pt reports transportation was late today.    Patient is accompanied by: Family member    Pertinent History Pt. is a 51 y.o. who was diagnosed with a CVA on July 21st, 2022. Pt. completed several weeks of  inpatient rehab at [facility]. After returning to home, Pt. sustained a fall in december of 2022, and was admitted to the hospital with COVI-19, and back pain from the fall. Since the most recent discharge, Pt. has been residing with the ex-wife until the Pt. is ready to return to independent living. Pt. PMHx includes: Bilateral LBP with right sided sciatica, Lumbosacral spondylosis myelopathy, closed compression Fx of L1. Pt. enjoys cooking, and riding motorcycles.    Currently in Pain?  No/denies            OT TREATMENT    Neuro muscular re-education:  Pt. worked on formulating a grasp patter in preparation for grasping 1" cubes with emphasis placed of aligning his thumb, and 2nd digit in preparation for grasping the cube, followed by the addition of wrist extension.   Therapeutic Exercise:  Pt. worked on [employer], and reciprocal motion using the UBE while seated for 8 min. with minimal resistance. Constant monitoring was provide, with the use of an ACE wrap and support at the elbow/forearm.   Pt. is making progress overall, and is now able to consistently align the right thumb with the 2nd digit for grasping the cubes. Pt. worked on extending the right wrist while holding the cube. Pt. reports RUE fatigue with the grasping task. Pt. continues to work on improving RUE functioning in order to work towards improving right hand function, developing grasp patterns, and maximizing overall independence with ADLs, and IADL tasks.                       OT Education - 06/11/21 0941     Education Details OT services    Person(s) Educated Patient;Spouse    Methods Explanation;Demonstration    Comprehension Verbalized understanding  OT Short Term Goals - 05/23/21 1219       OT SHORT TERM GOAL #1   Title Pt. wil demonstrtae independence with HEPs    Baseline Eval: Pt. currently does not have one    Time 6    Period Weeks    Status New    Target Date 07/04/21               OT Long Term Goals - 05/23/21 1225       OT LONG TERM GOAL #1   Title Pt. will improve RUE strength by 2 mm grades to assist with ADLs, and IADLs,    Baseline Eval: Right shoulder flexion, abduction 3/5, elbow flexion, extension wrist extension 3+/5,    Time 12    Period Weeks    Status New    Target Date 08/15/21      OT LONG TERM GOAL #2   Title Pt. will improve right grip by 10 lbs to prepare for firmly holding objects for IADLs.     Baseline Eval: R: 38#, L: 124# (In 3rd dynamometer slot)    Time 12    Period Weeks    Status New    Target Date 08/15/21      OT LONG TERM GOAL #3   Title Pt. will improve right lateral pinch strength by 5# to assist with cutting food    Baseline Eval: R: 17, L: 25    Time 12    Period Weeks    Status New    Target Date 08/15/21      OT LONG TERM GOAL #4   Title Pt. will improve Right 3pt pinch by 2# to be able to hold/open items for cooking    Baseline Eval: R: Pt. unable to engage thumb L: 29#    Time 12    Period Weeks    Status New    Target Date 08/15/21      OT LONG TERM GOAL #5   Title Pt. will right hand [facility] skills to be able to independently manipulate buttons, and zippers.    Baseline Eval: Pt. has difficulty managing buttons,a nd zippers.    Time 12    Period Weeks    Status New    Target Date 08/15/21      Long Term Additional Goals   Additional Long Term Goals Yes      OT LONG TERM GOAL #6   Title Pt. will independently write his name    Baseline Eval: Pt. is unable to hold a pen    Time 12    Period Weeks    Status New    Target Date 05/24/21      OT LONG TERM GOAL #7   Title Pt. will improve FOTO score by 2 points to reflect funational improvement    Baseline Eval: FOT score 46 with TR score 52    Time 12    Period Weeks    Status New    Target Date 08/15/21                   Plan - 06/11/21 0942     Clinical Impression Statement Pt. is making progress overall, and is now able to consistently align the right thumb with the 2nd digit for grasping the cubes. Pt. worked on extending the right wrist while holding the cube. Pt. reports RUE fatigue with the grasping task. Pt. continues to work on improving RUE functioning in order to work  towards improving right hand function, developing grasp patterns, and maximizing overall independence with ADLs, and IADL tasks.      OT Occupational Profile and History Detailed Assessment- Review of  Records and additional review of physical, cognitive, psychosocial history related to current functional performance    Occupational performance deficits (Please refer to evaluation for details): ADL's;IADL's;Education    Rehab Potential Good    Clinical Decision Making Several treatment options, min-mod task modification necessary    Comorbidities Affecting Occupational Performance: May have comorbidities impacting occupational performance    Modification or Assistance to Complete Evaluation  Min-Moderate modification of tasks or assist with assess necessary to complete eval    OT Frequency 2x / week    OT Duration 12 weeks    OT Treatment/Interventions Self-care/ADL training;DME and/or AE instruction;Therapeutic exercise;Ultrasound;Neuromuscular education;Therapeutic activities;Energy conservation;Moist Heat;Patient/family education;Splinting;Functional Mobility Training;Paraffin    Consulted and Agree with Plan of Care Patient             Patient will benefit from skilled therapeutic intervention in order to improve the following deficits and impairments:           Visit Diagnosis: Muscle weakness (generalized)    Problem List Patient Active Problem List   Diagnosis Date Noted   Alterations of sensations following cerebrovascular accident 03/08/2021   Aphasia as late effect of cerebrovascular accident (CVA) 03/08/2021   Muscle spasm 02/22/2021   Acute non-recurrent frontal sinusitis 02/22/2021   Chronic pain syndrome 01/23/2021   Primary hypertension 01/23/2021   Nerve pain 01/23/2021   Erectile dysfunction due to diseases classified elsewhere 01/23/2021   Cognitive dysfunction 01/23/2021   Scrotal edema 01/23/2021   Elevated LDL cholesterol level 01/23/2021   History of stroke with residual deficit 01/23/2021   Primary insomnia 01/23/2021   Mouth pain 01/23/2021   Laceration of right hand without foreign body    Closed compression fracture of L1 vertebra (HCC)  05/24/2016   Lumbar stenosis with neurogenic claudication 11/01/2015   Chronic 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 11/08/2014   Hx of hemorrhoids 11/08/2014   AAA (abdominal aortic aneurysm) (HCC) 09/22/2014   Chronic pain associated with significant psychosocial dysfunction 09/22/2014   Panic disorder 09/22/2014   AB (asthmatic bronchitis) 08/17/2014   Anxiety 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 08/17/2014   Backache 08/17/2014   Lumbosacral spondylosis without myelopathy 08/17/2014   Disorder of male genital organs 08/17/2014   Brash 08/17/2014   Low back pain 08/17/2014   Tendon nodule 08/17/2014   Episodic paroxysmal anxiety disorder 08/17/2014   Hernia, inguinal, right 08/17/2014   Fast heart beat 08/17/2014   Illness 08/17/2014   Inguinal hernia 10/14/2012   Olegario Messier, MS, OTR/L   Olegario Messier, OT 06/11/2021, 10:10 AM  [facility] MAIN [facility] SERVICES [address] [place], [place], [address] Phone: [phone]   Fax:  [phone]  Name: David Reeves MRN: [id number] Date of Birth: [birth year]

## 2021-06-12 ENCOUNTER — Ambulatory Visit: Payer: Medicare HMO | Admitting: Physical Therapy

## 2021-06-13 ENCOUNTER — Ambulatory Visit: Payer: Medicare HMO

## 2021-06-13 ENCOUNTER — Ambulatory Visit: Payer: Medicare HMO | Admitting: Occupational Therapy

## 2021-06-13 ENCOUNTER — Encounter: Payer: Self-pay | Admitting: Occupational Therapy

## 2021-06-13 ENCOUNTER — Other Ambulatory Visit: Payer: Self-pay

## 2021-06-13 ENCOUNTER — Ambulatory Visit: Payer: Medicare HMO | Admitting: Speech Pathology

## 2021-06-13 DIAGNOSIS — M6281 Muscle weakness (generalized): Secondary | ICD-10-CM

## 2021-06-13 DIAGNOSIS — R6889 Other general symptoms and signs: Secondary | ICD-10-CM | POA: Diagnosis not present

## 2021-06-13 DIAGNOSIS — R482 Apraxia: Secondary | ICD-10-CM

## 2021-06-13 DIAGNOSIS — R2681 Unsteadiness on feet: Secondary | ICD-10-CM | POA: Diagnosis not present

## 2021-06-13 DIAGNOSIS — G8929 Other chronic pain: Secondary | ICD-10-CM

## 2021-06-13 DIAGNOSIS — Z8673 Personal history of transient ischemic attack (TIA), and cerebral infarction without residual deficits: Secondary | ICD-10-CM | POA: Diagnosis not present

## 2021-06-13 DIAGNOSIS — R278 Other lack of coordination: Secondary | ICD-10-CM | POA: Diagnosis not present

## 2021-06-13 DIAGNOSIS — R262 Difficulty in walking, not elsewhere classified: Secondary | ICD-10-CM | POA: Diagnosis not present

## 2021-06-13 DIAGNOSIS — R4701 Aphasia: Secondary | ICD-10-CM

## 2021-06-13 DIAGNOSIS — M545 Low back pain, unspecified: Secondary | ICD-10-CM | POA: Diagnosis not present

## 2021-06-13 NOTE — Therapy (Signed)
[name] MAIN [facility] SERVICES [address] [place], [place], [address] Phone: [phone]   Fax:  [phone]  Physical Therapy Treatment  Patient Details  Name: David Reeves MRN: [id number] Date of Birth: Dec 18, 1970 Referring Provider (PT): Alfredia Ferguson PA   Encounter Date: 06/13/2021   PT End of Session - 06/13/21 1613     Visit Number 4    Number of Visits 17    Date for PT Re-Evaluation 07/25/21    Authorization Type [employer] 05/30/21-07/25/21    PT Start Time 1600    Equipment Utilized During Treatment Gait belt    Activity Tolerance Patient tolerated treatment well;Patient limited by pain    Behavior During Therapy Flat affect             Past Medical History:  Diagnosis Date   Back pain 04/22/2012   Bone spur    Bulging disc 04/22/2012   Degenerative disc disease    Osteoarthritis    Panic anxiety syndrome    Stroke [facility]) 10/2020   Taking multiple medications for chronic disease     Past Surgical History:  Procedure Laterality Date   CYSTECTOMY     head   HERNIA REPAIR Left 2006,2014   [facility]   TOE SURGERY Right 2007    There were no vitals filed for this visit.   Subjective Assessment - 06/13/21 1609     Subjective Patient reports 4/10 low back pain- States injection helped some. States wearing back brace today.    Pertinent History Pt. is a 51 y.o. who was diagnosed with a CVA on July 21st, 2022. Pt. completed several weeks of  inpatient rehab at [facility]. After returning to home. He was discharged in late August/early September and recieved home health PT. Pt. sustained a fall in december of 2022, and was admitted to the hospital with COVID-19, and Chronic back pain. He reports chronic back pain syndrome since 2012. Since the most recent discharge, Pt. has been residing with the ex-wife until the Pt. is ready to return to independent living. He did recieve 2 weeks of home health after discharge in  December 2022. He is now being referred to outpatient PT to address weakness from stroke and improve fine motor movement. He reports rarely getting numbness/tingling in RLE. PMHx includes: Bilateral LBP with right sided sciatica, Lumbosacral spondylosis myelopathy, closed compression Fx of L1. Has chronic insomnia. osteoarthritis, Panic anxiety syndrome (taking medication) Pt. enjoys cooking, and riding motorcycles. Patient is going to [facility] spine center for Epidural spinal injections on 06/07/21;    Limitations Sitting;Walking    How long can you sit comfortably? 20 min;    How long can you stand comfortably? 5-10 min;    How long can you walk comfortably? able to walk long ways (>500 feet)    Diagnostic tests MRI Nov 2022- Stable chronic burst L1 fracture, progressive disc degeneration with disc bulging  eccentric to the left at L3-4 and L4-5. spinal stenosis at L5-S1;    Patient Stated Goals "I Need all the help  I can get. I want to move around better and reduce my back pain."    Currently in Pain? Yes    Pain Score 4     Pain Location Back    Pain Orientation Posterior;Lower    Pain Descriptors / Indicators Aching;Tightness    Pain Type Chronic pain    Pain Onset More than a month ago    Pain Frequency Intermittent  Aggravating Factors  bending over/stopping/reaching    Pain Relieving Factors Heat, meds, resting - laying down    Effect of Pain on Daily Activities Decreased activity tolerance             INTERVENTIONS     Patient hooklying on mat table with moist heat to low back: Lumbar trunk rotation with legs apart (windshield wiper) x10 reps Instructed patient in single knee to chest 20 sec hold, progressed to hip circles clockwise/counterclockwise x5 reps, LLE only, required assistance on RLE due to RUE hemiplegia; Required AAROM on RLE x5 reps each direction;      Patient instructed in LE strengthening: Hooklying BLE Bridges x10 reps with good motor control and  positioning;  -Instructed in Transverse abdominal contraction with 5 sec hold  x10 reps Patient able to elicit contraction effectively -Suspended heel slides  with TA contraction x10 reps each LE, -Hooklying with green tband around distal quads with TA contraction             -hip abduction/ER x15 reps each LE with cues to keep                seated lumbar flex stretch with big blue theraball x 20 sec x 4 reps                     PT Education - 06/13/21 1612     Education Details Exercise technique    Person(s) Educated Patient    Methods Explanation;Demonstration;Tactile cues;Verbal cues    Comprehension Verbalized understanding;Returned demonstration;Verbal cues required;Tactile cues required;Need further instruction              PT Short Term Goals - 05/31/21 1018       PT SHORT TERM GOAL #1   Title Patient will be adherent to HEP at least 3x a week to improve functional strength and balance for better safety at home.    Time 4    Period Weeks    Status New    Target Date 06/28/21      PT SHORT TERM GOAL #2   Title Patient (< 52 years old) will complete five times sit to stand test in < 15 seconds indicating an increased LE strength and improved balance.    Time 4    Period Weeks    Status New    Target Date 06/28/21               PT Long Term Goals - 05/31/21 1019       PT LONG TERM GOAL #1   Title Patient will increase six minute walk test distance to >1000 for progression to community ambulator and improve gait ability    Time 8    Period Weeks    Status New    Target Date 07/25/21      PT LONG TERM GOAL #2   Title Patient will ascend/descend 8 stairs without rail assist independently without loss of balance to improve ability to get in/out of home.    Time 8    Period Weeks    Status New    Target Date 07/25/21      PT LONG TERM GOAL #3   Title Patient will increase BLE gross strength to 4+/5 as to improve functional strength for  independent gait, increased standing tolerance and increased ADL ability.    Time 8    Period Weeks    Status New    Target Date 07/25/21  PT LONG TERM GOAL #4   Title Patient will improve FOTO score to >50% to indicate improved functional mobility with less pain with ADLs.    Time 8    Period Weeks    Status New    Target Date 07/25/21      PT LONG TERM GOAL #5   Title Patient will report a worst pain of 4/10 in low back over last week to indicate improved tolerance with ADLs.    Time 8    Period Weeks    Status New    Target Date 07/26/21                   Plan - 06/13/21 1613     Personal Factors and Comorbidities Comorbidity 3+;Past/Current Experience;Time since onset of injury/illness/exacerbation    Comorbidities PMHx includes: Bilateral LBP with right sided sciatica, Lumbosacral spondylosis myelopathy, closed compression Fx of L1. Has chronic insomnia. osteoarthritis, Panic anxiety syndrome (taking medication)    Examination-Activity Limitations Bend;Carry;Locomotion Level;Sit;Squat;Stairs;Stand;Transfers    Examination-Participation Restrictions Cleaning;Community Activity;Driving;Laundry;Meal Prep;Occupation;Shop;Volunteer;Yard Work    Stability/Clinical Decision Making Stable/Uncomplicated    Rehab Potential Good    PT Frequency 2x / week    PT Duration 8 weeks    PT Treatment/Interventions Cryotherapy;Electrical Stimulation;Moist Heat;Traction;Gait training;Stair training;Functional mobility training;Therapeutic activities;Therapeutic exercise;Balance training;Neuromuscular re-education;Patient/family education;Manual techniques;Passive range of motion;Dry needling;Energy conservation    PT Next Visit Plan Progress core stabilization, Manual therapy for low back and LE ROM    Consulted and Agree with Plan of Care Patient             Patient will benefit from skilled therapeutic intervention in order to improve the following deficits and impairments:   Abnormal gait, Decreased endurance, Decreased mobility, Difficulty walking, Increased muscle spasms, Decreased range of motion, Decreased activity tolerance, Decreased strength, Postural dysfunction, Pain  Visit Diagnosis: No diagnosis found.     Problem List Patient Active Problem List   Diagnosis Date Noted   Alterations of sensations following cerebrovascular accident 03/08/2021   Aphasia as late effect of cerebrovascular accident (CVA) 03/08/2021   Muscle spasm 02/22/2021   Acute non-recurrent frontal sinusitis 02/22/2021   Chronic pain syndrome 01/23/2021   Primary hypertension 01/23/2021   Nerve pain 01/23/2021   Erectile dysfunction due to diseases classified elsewhere 01/23/2021   Cognitive dysfunction 01/23/2021   Scrotal edema 01/23/2021   Elevated LDL cholesterol level 01/23/2021   History of stroke with residual deficit 01/23/2021   Primary insomnia 01/23/2021   Mouth pain 01/23/2021   Laceration of right hand without foreign body    Closed compression fracture of L1 vertebra (HCC) 05/24/2016   Lumbar stenosis with neurogenic claudication 11/01/2015   Chronic 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 11/08/2014   Hx of hemorrhoids 11/08/2014   AAA (abdominal aortic aneurysm) (HCC) 09/22/2014   Chronic pain associated with significant psychosocial dysfunction 09/22/2014   Panic disorder 09/22/2014   AB (asthmatic bronchitis) 08/17/2014   Anxiety 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 08/17/2014   Backache 08/17/2014   Lumbosacral spondylosis without myelopathy 08/17/2014   Disorder of male genital organs 08/17/2014   Brash 08/17/2014   Low back pain 08/17/2014   Tendon nodule 08/17/2014   Episodic paroxysmal anxiety disorder 08/17/2014   Hernia, inguinal, right 08/17/2014   Fast heart beat 08/17/2014   Illness 08/17/2014   Inguinal hernia 10/14/2012    Lenda Kelp, PT 06/13/2021, 4:23 PM  [place] [facility]  MAIN [facility] SERVICES [address] [place], [place], [address] Phone: [phone]   Fax:  [phone]  Name: David Reeves MRN: [id number] Date of Birth: 13-Mar-1971

## 2021-06-13 NOTE — Therapy (Signed)
[place] [facility] MAIN [facility] SERVICES [address] [place], [place], [address] Phone: [phone]   Fax:  [phone]  Occupational Therapy Treatment  Patient Details  Name: David Reeves MRN: [id number] Date of Birth: 1970-07-19 Referring Provider (OT): Alfredia Ferguson   Encounter Date: 06/13/2021   OT End of Session - 06/13/21 1743     Visit Number 8    Number of Visits 24    Date for OT Re-Evaluation 08/15/21    Authorization Type Progress report period starting 05/23/2021    OT Start Time 1647    OT Stop Time 1730    OT Time Calculation (min) 43 min    Activity Tolerance Patient tolerated treatment well    Behavior During Therapy Flat affect             Past Medical History:  Diagnosis Date   Back pain 04/22/2012   Bone spur    Bulging disc 04/22/2012   Degenerative disc disease    Osteoarthritis    Panic anxiety syndrome    Stroke [facility]) 10/2020   Taking multiple medications for chronic disease     Past Surgical History:  Procedure Laterality Date   CYSTECTOMY     head   HERNIA REPAIR Left 2006,2014   [facility]   TOE SURGERY Right 2007    There were no vitals filed for this visit.   Subjective Assessment - 06/13/21 1743     Subjective  Pt. reports doing well today    Patient is accompanied by: Family member    Pertinent History Pt. is a 51 y.o. who was diagnosed with a CVA on July 21st, 2022. Pt. completed several weeks of  inpatient rehab at [facility]. After returning to home, Pt. sustained a fall in december of 2022, and was admitted to the hospital with COVI-19, and back pain from the fall. Since the most recent discharge, Pt. has been residing with the ex-wife until the Pt. is ready to return to independent living. Pt. PMHx includes: Bilateral LBP with right sided sciatica, Lumbosacral spondylosis myelopathy, closed compression Fx of L1. Pt. enjoys cooking, and riding motorcycles.    Currently in Pain? No/denies             OT TREATMENT     Neuro muscular re-education:  Pt. worked on formulating a grasp pattern for grasping [place] style discs. Pt. worked on reaching with the RUE for [place] rings from a seated position. Pt. Was able to move 1/2 of the rings to the 2nd rung. Pt. then worked on moving the remainder of the rings while in standing with alternating reps of weightbearing.    Therapeutic Exercise:   Pt. worked on [employer], and reciprocal motion using the UBE while seated for 8 min. with minimal resistance. Constant monitoring was provide, with the use of an ACE wrap and support at the elbow/forearm. Pt. worked on gross gripping using the gross gripper set at 6.6#. Pt. was able to clear 6 larger vertical pegs.    Pt. is making progress overall, and is now able to consistently align the right thumb with the 2nd digit for grasping the cubes. Pt. required support proximally for the RUE when using the UBE. Pt. did not require support proximally when reaching with the RUE, only required rest breaks, and reps of  weightbearing. Pt. continues to work on improving RUE functioning in order to work towards improving right hand function, developing grasp patterns, and maximizing overall independence with ADLs, and  IADL tasks.                           OT Education - 06/13/21 1743     Education Details OT services    Person(s) Educated Patient;Spouse    Comprehension Verbalized understanding              OT Short Term Goals - 05/23/21 1219       OT SHORT TERM GOAL #1   Title Pt. wil demonstrtae independence with HEPs    Baseline Eval: Pt. currently does not have one    Time 6    Period Weeks    Status New    Target Date 07/04/21               OT Long Term Goals - 05/23/21 1225       OT LONG TERM GOAL #1   Title Pt. will improve RUE strength by 2 mm grades to assist with ADLs, and IADLs,    Baseline Eval: Right shoulder flexion, abduction 3/5, elbow  flexion, extension wrist extension 3+/5,    Time 12    Period Weeks    Status New    Target Date 08/15/21      OT LONG TERM GOAL #2   Title Pt. will improve right grip by 10 lbs to prepare for firmly holding objects for IADLs.    Baseline Eval: R: 38#, L: 124# (In 3rd dynamometer slot)    Time 12    Period Weeks    Status New    Target Date 08/15/21      OT LONG TERM GOAL #3   Title Pt. will improve right lateral pinch strength by 5# to assist with cutting food    Baseline Eval: R: 17, L: 25    Time 12    Period Weeks    Status New    Target Date 08/15/21      OT LONG TERM GOAL #4   Title Pt. will improve Right 3pt pinch by 2# to be able to hold/open items for cooking    Baseline Eval: R: Pt. unable to engage thumb L: 29#    Time 12    Period Weeks    Status New    Target Date 08/15/21      OT LONG TERM GOAL #5   Title Pt. will right hand [facility] skills to be able to independently manipulate buttons, and zippers.    Baseline Eval: Pt. has difficulty managing buttons,a nd zippers.    Time 12    Period Weeks    Status New    Target Date 08/15/21      Long Term Additional Goals   Additional Long Term Goals Yes      OT LONG TERM GOAL #6   Title Pt. will independently write his name    Baseline Eval: Pt. is unable to hold a pen    Time 12    Period Weeks    Status New    Target Date 05/24/21      OT LONG TERM GOAL #7   Title Pt. will improve FOTO score by 2 points to reflect funational improvement    Baseline Eval: FOT score 46 with TR score 52    Time 12    Period Weeks    Status New    Target Date 08/15/21  Plan - 06/13/21 1744     Clinical Impression Statement Pt. is making progress overall, and is now able to consistently align the right thumb with the 2nd digit for grasping the cubes. Pt. required support proximally for the RUE when using the UBE. Pt. did not require support proximally when reaching with the RUE, only required rest  breaks, and reps of  weightbearing. Pt. continues to work on improving RUE functioning in order to work towards improving right hand function, developing grasp patterns, and maximizing overall independence with ADLs, and IADL tasks.    OT Occupational Profile and History Detailed Assessment- Review of Records and additional review of physical, cognitive, psychosocial history related to current functional performance    Occupational performance deficits (Please refer to evaluation for details): ADL's;IADL's;Education    Rehab Potential Good    Clinical Decision Making Several treatment options, min-mod task modification necessary    Comorbidities Affecting Occupational Performance: May have comorbidities impacting occupational performance    Modification or Assistance to Complete Evaluation  Min-Moderate modification of tasks or assist with assess necessary to complete eval    OT Frequency 2x / week    OT Duration 12 weeks    OT Treatment/Interventions Self-care/ADL training;DME and/or AE instruction;Therapeutic exercise;Ultrasound;Neuromuscular education;Therapeutic activities;Energy conservation;Moist Heat;Patient/family education;Splinting;Functional Mobility Training;Paraffin    Consulted and Agree with Plan of Care Patient             Patient will benefit from skilled therapeutic intervention in order to improve the following deficits and impairments:           Visit Diagnosis: Muscle weakness (generalized)    Problem List Patient Active Problem List   Diagnosis Date Noted   Alterations of sensations following cerebrovascular accident 03/08/2021   Aphasia as late effect of cerebrovascular accident (CVA) 03/08/2021   Muscle spasm 02/22/2021   Acute non-recurrent frontal sinusitis 02/22/2021   Chronic pain syndrome 01/23/2021   Primary hypertension 01/23/2021   Nerve pain 01/23/2021   Erectile dysfunction due to diseases classified elsewhere 01/23/2021   Cognitive  dysfunction 01/23/2021   Scrotal edema 01/23/2021   Elevated LDL cholesterol level 01/23/2021   History of stroke with residual deficit 01/23/2021   Primary insomnia 01/23/2021   Mouth pain 01/23/2021   Laceration of right hand without foreign body    Closed compression fracture of L1 vertebra (HCC) 05/24/2016   Lumbar stenosis with neurogenic claudication 11/01/2015   Chronic 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 11/08/2014   Hx of hemorrhoids 11/08/2014   AAA (abdominal aortic aneurysm) (HCC) 09/22/2014   Chronic pain associated with significant psychosocial dysfunction 09/22/2014   Panic disorder 09/22/2014   AB (asthmatic bronchitis) 08/17/2014   Anxiety 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 08/17/2014   Backache 08/17/2014   Lumbosacral spondylosis without myelopathy 08/17/2014   Disorder of male genital organs 08/17/2014   Brash 08/17/2014   Low back pain 08/17/2014   Tendon nodule 08/17/2014   Episodic paroxysmal anxiety disorder 08/17/2014   Hernia, inguinal, right 08/17/2014   Fast heart beat 08/17/2014   Illness 08/17/2014   Inguinal hernia 10/14/2012   Olegario Messier, MS, OTR/L   Olegario Messier, OT 06/13/2021, 5:47 PM  [place] [facility] MAIN [facility] SERVICES [address] [place], [place], [address] Phone: [phone]   Fax:  [phone]  Name: David Reeves MRN: [id number] Date of Birth: 1971-03-01

## 2021-06-14 ENCOUNTER — Other Ambulatory Visit: Payer: Self-pay | Admitting: Physician Assistant

## 2021-06-14 ENCOUNTER — Ambulatory Visit: Payer: Medicare HMO | Admitting: Physical Therapy

## 2021-06-14 DIAGNOSIS — G894 Chronic pain syndrome: Secondary | ICD-10-CM

## 2021-06-14 NOTE — Patient Instructions (Signed)
Use pacing board when reading riddles

## 2021-06-14 NOTE — Telephone Encounter (Signed)
Medication Refill - Medication:  tiZANidine (ZANAFLEX) 4 MG tablet   Has the patient contacted their pharmacy? Yes.   Contact PCP  Preferred Pharmacy (with phone number or street name):  [facility] [address]  [place], [place] [address]  Phone:  [phone]  Fax:  [phone]  Has the patient been seen for an appointment in the last year OR does the patient have an upcoming appointment? Yes.    Agent: Please be advised that RX refills may take up to 3 business days. We ask that you follow-up with your pharmacy.

## 2021-06-14 NOTE — Telephone Encounter (Signed)
Requested medication (s) are due for refill today - no  Requested medication (s) are on the active medication list -yes  Future visit scheduled -yes  Last refill: 06/05/21 #360  Notes to clinic: Request RF: non delegated Rx  Requested Prescriptions  Pending Prescriptions Disp Refills   tiZANidine (ZANAFLEX) 4 MG tablet 360 tablet 0    Sig: Take 1 tablet (4 mg total) by mouth in the morning, at noon, in the evening, and at bedtime.     Not Delegated - Cardiovascular:  Alpha-2 Agonists - tizanidine Failed - 06/14/2021 11:08 AM      Failed - This refill cannot be delegated      Passed - Valid encounter within last 6 months    Recent Outpatient Visits           1 month ago Alterations of sensations following cerebrovascular accident   [facility] Ok Edwards, [place], PA-C   3 months ago Alterations of sensations following cerebrovascular accident   [facility] Ok Edwards, [place], PA-C   3 months ago Muscle spasm   [facility] Merita Norton T, FNP   4 months ago Nerve pain   [facility] Merita Norton T, FNP   6 months ago Cerebrovascular accident (CVA) due to occlusion of left middle cerebral artery [facility])   [facility] Chrismon, Jodell Cipro, PA-C       Future Appointments             In 1 month Drubel, Lillia Abed, PA-C [facility]               Requested Prescriptions  Pending Prescriptions Disp Refills   tiZANidine (ZANAFLEX) 4 MG tablet 360 tablet 0    Sig: Take 1 tablet (4 mg total) by mouth in the morning, at noon, in the evening, and at bedtime.     Not Delegated - Cardiovascular:  Alpha-2 Agonists - tizanidine Failed - 06/14/2021 11:08 AM      Failed - This refill cannot be delegated      Passed - Valid encounter within last 6 months    Recent Outpatient Visits           1 month ago Alterations of sensations following cerebrovascular accident   [facility]  Ok Edwards, [place], PA-C   3 months ago Alterations of sensations following cerebrovascular accident   [facility] Ok Edwards, [place], PA-C   3 months ago Muscle spasm   [facility] Jacky Kindle, FNP   4 months ago Nerve pain   [facility] Merita Norton T, FNP   6 months ago Cerebrovascular accident (CVA) due to occlusion of left middle cerebral artery [facility])   [facility] Chrismon, Jodell Cipro, PA-C       Future Appointments             In 1 month Drubel, Lillia Abed, PA-C [facility]

## 2021-06-14 NOTE — Therapy (Signed)
[place] [facility] MAIN [facility] SERVICES [address] [place], [place], [address] Phone: [phone]   Fax:  [phone]  Physical Therapy Treatment  Patient Details  Name: David Reeves MRN: [id number] Date of Birth: [birth year] Referring Provider (PT): Alfredia Ferguson PA   Encounter Date: 06/13/2021   PT End of Session - 06/14/21 0001     Visit Number 4    Number of Visits 17    Date for PT Re-Evaluation 07/25/21    Authorization Type [employer] 05/30/21-07/25/21    PT Start Time 1600    PT Stop Time 1644    PT Time Calculation (min) 44 min    Equipment Utilized During Treatment Gait belt    Activity Tolerance Patient tolerated treatment well;Patient limited by pain    Behavior During Therapy Flat affect             Past Medical History:  Diagnosis Date   Back pain 04/22/2012   Bone spur    Bulging disc 04/22/2012   Degenerative disc disease    Osteoarthritis    Panic anxiety syndrome    Stroke [facility]) 10/2020   Taking multiple medications for chronic disease     Past Surgical History:  Procedure Laterality Date   CYSTECTOMY     head   HERNIA REPAIR Left 2006,2014   [facility]   TOE SURGERY Right 2007    There were no vitals filed for this visit.   Subjective Assessment - 06/13/21 1707     Subjective Patient reports 4/10 low back pain- States injection helped some. States wearing back brace today.    Pertinent History Pt. is a 51 y.o. who was diagnosed with a CVA on July 21st, 2022. Pt. completed several weeks of  inpatient rehab at [facility] After returning to home. He was discharged in late August/early September and recieved home health PT. Pt. sustained a fall in december of 2022, and was admitted to the hospital with COVID-19, and Chronic back pain. He reports chronic back pain syndrome since 2012. Since the most recent discharge, Pt. has been residing with the ex-wife until the Pt. is ready to return to independent living. He  did recieve 2 weeks of home health after discharge in December 2022. He is now being referred to outpatient PT to address weakness from stroke and improve fine motor movement. He reports rarely getting numbness/tingling in RLE. PMHx includes: Bilateral LBP with right sided sciatica, Lumbosacral spondylosis myelopathy, closed compression Fx of L1. Has chronic insomnia. osteoarthritis, Panic anxiety syndrome (taking medication) Pt. enjoys cooking, and riding motorcycles. Patient is going to [facility] center for Epidural spinal injections on 06/07/21;    Limitations Sitting;Walking    How long can you sit comfortably? 20 min;    How long can you stand comfortably? 5-10 min;    How long can you walk comfortably? able to walk long ways (>500 feet)    Diagnostic tests MRI Nov 2022- Stable chronic burst L1 fracture, progressive disc degeneration with disc bulging  eccentric to the left at L3-4 and L4-5. spinal stenosis at L5-S1;    Patient Stated Goals "I Need all the help  I can get. I want to move around better and reduce my back pain."    Currently in Pain? Yes    Pain Score 4     Pain Location Back    Pain Orientation Posterior;Lower    Pain Descriptors / Indicators Aching;Tightness    Pain Type Chronic pain  Pain Onset More than a month ago    Pain Frequency Intermittent    Aggravating Factors  bending over/stooping/reaching    Pain Relieving Factors Heat, meds, resting- laying down    Effect of Pain on Daily Activities Decreased activity tolerance            INTERVENTIONS:   Manual therapy: Patient lying in supine with moist heat to low back.   Hamstring stretch (BLE) x 30 sec x 4 each leg.  Knee to chest (BLE) x 30 sec x 4 each leg Lower trunk rotation- BLE x 30 sec x 4 each LE Manual hip circles (CW/CCW) x 20 -25 reps each direction and each LE Pirifomis x 30 sec x 3 sets each LE   Instruction in core stabilization:  Bridge with 3 sec hold x 10 reps Transverse abdominal  contraction- hooklye with instruction to fee for front hip bony prominence (ASIS) and move finger medially along lower abdominal - then brace abs until he felt tension under his fingertips- 5 sec hold x 10 reps.   Progressed to TA contraction with leg lifts x 10 reps each leg TA Contraction with hip ABD using GTB x 10 reps.   Education provided throughout session via VC/TC and demonstration to facilitate movement at target joints and correct muscle activation for all testing and exercises performed.    Patient did report slight increase in back soreness after visit- 5/10 but stats he feels more limber.                       PT Education - 06/14/21 1709     Education Details Exercise technique    Person(s) Educated Patient    Methods Explanation;Tactile cues;Demonstration;Verbal cues    Comprehension Verbalized understanding;Returned demonstration;Verbal cues required;Tactile cues required;Need further instruction              PT Short Term Goals - 05/31/21 1018       PT SHORT TERM GOAL #1   Title Patient will be adherent to HEP at least 3x a week to improve functional strength and balance for better safety at home.    Time 4    Period Weeks    Status New    Target Date 06/28/21      PT SHORT TERM GOAL #2   Title Patient (< 42 years old) will complete five times sit to stand test in < 15 seconds indicating an increased LE strength and improved balance.    Time 4    Period Weeks    Status New    Target Date 06/28/21               PT Long Term Goals - 05/31/21 1019       PT LONG TERM GOAL #1   Title Patient will increase six minute walk test distance to >1000 for progression to community ambulator and improve gait ability    Time 8    Period Weeks    Status New    Target Date 07/25/21      PT LONG TERM GOAL #2   Title Patient will ascend/descend 8 stairs without rail assist independently without loss of balance to improve ability to get in/out  of home.    Time 8    Period Weeks    Status New    Target Date 07/25/21      PT LONG TERM GOAL #3   Title Patient will increase BLE gross strength to 4+/5  as to improve functional strength for independent gait, increased standing tolerance and increased ADL ability.    Time 8    Period Weeks    Status New    Target Date 07/25/21      PT LONG TERM GOAL #4   Title Patient will improve FOTO score to >50% to indicate improved functional mobility with less pain with ADLs.    Time 8    Period Weeks    Status New    Target Date 07/25/21      PT LONG TERM GOAL #5   Title Patient will report a worst pain of 4/10 in low back over last week to indicate improved tolerance with ADLs.    Time 8    Period Weeks    Status New    Target Date 07/26/21                   Plan - 06/13/21 1711     Clinical Impression Statement Patient was able to respond well overall to verbal and tactile cues for correct technique with stretching and core stabilization. He was able to progress core stabilization exercises without significant difficulty today. Patient would benefit from additional skilled PT Intervention to improve strength and mobility    Personal Factors and Comorbidities Comorbidity 3+;Past/Current Experience;Time since onset of injury/illness/exacerbation    Comorbidities PMHx includes: Bilateral LBP with right sided sciatica, Lumbosacral spondylosis myelopathy, closed compression Fx of L1. Has chronic insomnia. osteoarthritis, Panic anxiety syndrome (taking medication)    Examination-Activity Limitations Bend;Carry;Locomotion Level;Sit;Squat;Stairs;Stand;Transfers    Examination-Participation Restrictions Cleaning;Community Activity;Driving;Laundry;Meal Prep;Occupation;Shop;Volunteer;Yard Work    Stability/Clinical Decision Making Stable/Uncomplicated    Rehab Potential Good    PT Frequency 2x / week    PT Duration 8 weeks    PT Treatment/Interventions Cryotherapy;Electrical  Stimulation;Moist Heat;Traction;Gait training;Stair training;Functional mobility training;Therapeutic activities;Therapeutic exercise;Balance training;Neuromuscular re-education;Patient/family education;Manual techniques;Passive range of motion;Dry needling;Energy conservation    PT Next Visit Plan Progress core stabilization, Manual therapy for low back and LE ROM    Consulted and Agree with Plan of Care Patient             Patient will benefit from skilled therapeutic intervention in order to improve the following deficits and impairments:  Abnormal gait, Decreased endurance, Decreased mobility, Difficulty walking, Increased muscle spasms, Decreased range of motion, Decreased activity tolerance, Decreased strength, Postural dysfunction, Pain  Visit Diagnosis: Muscle weakness (generalized)  Difficulty in walking, not elsewhere classified  Unsteadiness on feet  Chronic bilateral low back pain without sciatica     Problem List Patient Active Problem List   Diagnosis Date Noted   Alterations of sensations following cerebrovascular accident 03/08/2021   Aphasia as late effect of cerebrovascular accident (CVA) 03/08/2021   Muscle spasm 02/22/2021   Acute non-recurrent frontal sinusitis 02/22/2021   Chronic pain syndrome 01/23/2021   Primary hypertension 01/23/2021   Nerve pain 01/23/2021   Erectile dysfunction due to diseases classified elsewhere 01/23/2021   Cognitive dysfunction 01/23/2021   Scrotal edema 01/23/2021   Elevated LDL cholesterol level 01/23/2021   History of stroke with residual deficit 01/23/2021   Primary insomnia 01/23/2021   Mouth pain 01/23/2021   Laceration of right hand without foreign body    Closed compression fracture of L1 vertebra ([place]) 05/24/2016   Lumbar stenosis with neurogenic claudication 11/01/2015   Chronic 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 11/08/2014   Hx of hemorrhoids 11/08/2014   AAA (abdominal aortic aneurysm) ([place])  09/22/2014   Chronic pain associated  with significant psychosocial dysfunction 09/22/2014   Panic disorder 09/22/2014   AB (asthmatic bronchitis) 08/17/2014   Anxiety 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 08/17/2014   Backache 08/17/2014   Lumbosacral spondylosis without myelopathy 08/17/2014   Disorder of male genital organs 08/17/2014   Brash 08/17/2014   Low back pain 08/17/2014   Tendon nodule 08/17/2014   Episodic paroxysmal anxiety disorder 08/17/2014   Hernia, inguinal, right 08/17/2014   Fast heart beat 08/17/2014   Illness 08/17/2014   Inguinal hernia 10/14/2012    Lewis Moccasin, PT 06/14/2021, 5:24 PM  [place] MAIN [facility] SERVICES [address] [place], [place], [address] Phone: [phone]   Fax:  [phone]  Name: Velton Bou MRN: [id number] Date of Birth: [birth year]

## 2021-06-14 NOTE — Therapy (Signed)
[place] MAIN [facility] SERVICES [address] [place], [place], [address] Phone: [phone]   Fax:  [phone]  Speech Language Pathology Treatment  Patient Details  Name: David Reeves MRN: [id number] Date of Birth: 1971/03/16 Referring Provider (SLP): [name]   Encounter Date: 06/13/2021   End of Session - 06/14/21 2122     Visit Number 2    Number of Visits 25    Date for SLP Re-Evaluation 08/27/21    Authorization Type [employer]    Authorization Time Period 06/04/2021 thru 08/27/2021    Authorization - Visit Number 2    Progress Note Due on Visit 10    SLP Start Time 1500    SLP Stop Time  1600    SLP Time Calculation (min) 60 min    Activity Tolerance Patient tolerated treatment well             Past Medical History:  Diagnosis Date   Back pain 04/22/2012   Bone spur    Bulging disc 04/22/2012   Degenerative disc disease    Osteoarthritis    Panic anxiety syndrome    Stroke [facility]) 10/2020   Taking multiple medications for chronic disease     Past Surgical History:  Procedure Laterality Date   CYSTECTOMY     head   HERNIA REPAIR Left 2006,2014   [facility]   TOE SURGERY Right 2007    There were no vitals filed for this visit.   Subjective Assessment - 06/14/21 2121     Subjective pt pleasant, appeared jovel    Currently in Pain? No/denies                   ADULT SLP TREATMENT - 06/14/21 0001       Treatment Provided   Treatment provided Cognitive-Linquistic      Cognitive-Linquistic Treatment   Treatment focused on Apraxia;Aphasia;Patient/family/caregiver education    Skilled Treatment Skilled treatment session focused on pt's motor speech impairment. SLP facilitated session by instructing pt in using compensatory speech intelligibility strategy of PACING. Visual aid provided for tactile pacing while reading list of 30 functional everyday phrases. Pt required initial moderate  assistance to slow rate of pacing but was able to modulate rate with list of phrases. SLP further facilitated session by providing list of simple sentence riddles that pt can tell his teenage daughter. Pt required maximal multimodal cues to monitor rate to achieve ~ 75% speech intelligibility.              SLP Education - 06/14/21 2122     Education Details slow rate of speech improves speech intelligibility    Person(s) Educated Patient    Methods Explanation;Demonstration;Verbal cues;Handout    Comprehension Verbalized understanding;Need further instruction              SLP Short Term Goals - 06/04/21 1536       SLP SHORT TERM GOAL #1   Title Pt will use the speech intelligibility strategy of pacing to slow rate of speech at the simple sentence level in 8 out of 10 opportunities.    Baseline < 25% speech intelligiblity d/t fast rate of speech    Time 10    Period --   sessions   Status New      SLP SHORT TERM GOAL #2   Title Pt will produce fricatives in the initial, final and medial positions of words with 90% accuracy.    Baseline pt stops  all fricatives    Time 10    Period --   sessions   Status New      SLP SHORT TERM GOAL #3   Title Pt will use speech intelligibility strategy of overarticulation to improve speech intelligibility to 75% at the simple sentence level.    Baseline < 25% speech intelligibili    Time 10    Period --   sessions   Status New      SLP SHORT TERM GOAL #4   Title Pt will answer WH questions after reading paragraph level passage with 75% accuracy.    Baseline new goal    Time 10    Period --   sessions   Status New              SLP Long Term Goals - 06/04/21 1540       SLP LONG TERM GOAL #1   Title Pt will use speech intelligibility stategies to achieve 50% speech intelligibility at the sentence level.    Baseline < 25% speech intelligibility    Time 12    Period Weeks    Status New    Target Date 08/27/21               Plan - 06/14/21 2122     Clinical Impression Statement Pt very receptive to use of pacing with immediate improved noted in speech intelligibility. With sentence length utterances, pt with difficulty modulating rate, inhibiting ability to start over when sound is in error and leaving out words or sounds.  As such, skilled ST intervention is required to improve pt's functional independence and reduce caregiver burden.    Speech Therapy Frequency 2x / week    Duration 12 weeks    Treatment/Interventions Language facilitation;Compensatory techniques;Cueing hierarchy;Internal/external aids;SLP instruction and feedback;Patient/family education;Compensatory strategies    Potential to Achieve Goals Fair    Potential Considerations Other (comment);Severity of impairments    SLP Home Exercise Plan provided, see pt instructions section    Consulted and Agree with Plan of Care Patient             Patient will benefit from skilled therapeutic intervention in order to improve the following deficits and impairments:   Apraxia  H/O ischemic left MCA stroke  Aphasia    Problem List Patient Active Problem List   Diagnosis Date Noted   Alterations of sensations following cerebrovascular accident 03/08/2021   Aphasia as late effect of cerebrovascular accident (CVA) 03/08/2021   Muscle spasm 02/22/2021   Acute non-recurrent frontal sinusitis 02/22/2021   Chronic pain syndrome 01/23/2021   Primary hypertension 01/23/2021   Nerve pain 01/23/2021   Erectile dysfunction due to diseases classified elsewhere 01/23/2021   Cognitive dysfunction 01/23/2021   Scrotal edema 01/23/2021   Elevated LDL cholesterol level 01/23/2021   History of stroke with residual deficit 01/23/2021   Primary insomnia 01/23/2021   Mouth pain 01/23/2021   Laceration of right hand without foreign body    Closed compression fracture of L1 vertebra (HCC) 05/24/2016   Lumbar stenosis with neurogenic claudication  11/01/2015   Chronic 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 11/08/2014   Hx of hemorrhoids 11/08/2014   AAA (abdominal aortic aneurysm) (HCC) 09/22/2014   Chronic pain associated with significant psychosocial dysfunction 09/22/2014   Panic disorder 09/22/2014   AB (asthmatic bronchitis) 08/17/2014   Anxiety 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 08/17/2014   Backache 08/17/2014   Lumbosacral spondylosis without myelopathy 08/17/2014   Disorder of  male genital organs 08/17/2014   Brash 08/17/2014   Low back pain 08/17/2014   Tendon nodule 08/17/2014   Episodic paroxysmal anxiety disorder 08/17/2014   Hernia, inguinal, right 08/17/2014   Fast heart beat 08/17/2014   Illness 08/17/2014   Inguinal hernia 10/14/2012   Roxie Gueye B. Rutherford Nail M.S., CCC-SLP, [facility] Pathologist Rehabilitation Services Office [phone], [place] 06/14/2021, 9:24 PM  [place] MAIN [facility] SERVICES [address] [place], [place], [address] Phone: [phone]   Fax:  [phone]   Name: Kavik Yerton MRN: [id number] Date of Birth: Aug 19, 1970

## 2021-06-18 ENCOUNTER — Ambulatory Visit: Payer: Medicare HMO | Admitting: Occupational Therapy

## 2021-06-19 ENCOUNTER — Other Ambulatory Visit: Payer: Self-pay

## 2021-06-19 ENCOUNTER — Ambulatory Visit: Payer: Medicare HMO

## 2021-06-19 ENCOUNTER — Ambulatory Visit: Payer: Medicare HMO | Admitting: Physical Therapy

## 2021-06-19 ENCOUNTER — Ambulatory Visit: Payer: Medicare HMO | Admitting: Speech Pathology

## 2021-06-19 DIAGNOSIS — R482 Apraxia: Secondary | ICD-10-CM

## 2021-06-19 DIAGNOSIS — M545 Low back pain, unspecified: Secondary | ICD-10-CM | POA: Diagnosis not present

## 2021-06-19 DIAGNOSIS — R278 Other lack of coordination: Secondary | ICD-10-CM

## 2021-06-19 DIAGNOSIS — R6889 Other general symptoms and signs: Secondary | ICD-10-CM | POA: Diagnosis not present

## 2021-06-19 DIAGNOSIS — Z8673 Personal history of transient ischemic attack (TIA), and cerebral infarction without residual deficits: Secondary | ICD-10-CM | POA: Diagnosis not present

## 2021-06-19 DIAGNOSIS — R2681 Unsteadiness on feet: Secondary | ICD-10-CM | POA: Diagnosis not present

## 2021-06-19 DIAGNOSIS — M6281 Muscle weakness (generalized): Secondary | ICD-10-CM

## 2021-06-19 DIAGNOSIS — G8929 Other chronic pain: Secondary | ICD-10-CM | POA: Diagnosis not present

## 2021-06-19 DIAGNOSIS — R4701 Aphasia: Secondary | ICD-10-CM | POA: Diagnosis not present

## 2021-06-19 DIAGNOSIS — R262 Difficulty in walking, not elsewhere classified: Secondary | ICD-10-CM | POA: Diagnosis not present

## 2021-06-19 DIAGNOSIS — M47817 Spondylosis without myelopathy or radiculopathy, lumbosacral region: Secondary | ICD-10-CM

## 2021-06-19 NOTE — Therapy (Addendum)
[place] MAIN [facility] SERVICES [address] [place], [place], [address] Phone: [phone]   Fax:  [phone]  Speech Language Pathology Treatment  Patient Details  Name: David Reeves MRN: [id number] Date of Birth: [birth year] Referring Provider (SLP): Alfredia Ferguson   Encounter Date: 06/19/2021   End of Session - 06/19/21 1713     Visit Number 3    Number of Visits 25    Date for SLP Re-Evaluation 08/27/21    Authorization Type [employer]    Authorization Time Period 06/04/2021 thru 08/27/2021    Authorization - Visit Number 3    Progress Note Due on Visit 10    SLP Start Time 1000    SLP Stop Time  1100    SLP Time Calculation (min) 60 min    Activity Tolerance Patient tolerated treatment well             Past Medical History:  Diagnosis Date   Back pain 04/22/2012   Bone spur    Bulging disc 04/22/2012   Degenerative disc disease    Osteoarthritis    Panic anxiety syndrome    Stroke [facility]) 10/2020   Taking multiple medications for chronic disease     Past Surgical History:  Procedure Laterality Date   CYSTECTOMY     head   HERNIA REPAIR Left 2006,2014   [facility]   TOE SURGERY Right 2007    There were no vitals filed for this visit.   Subjective Assessment - 06/19/21 1630     Subjective pt pleasant, appeared jovel    Currently in Pain? No/denies                   ADULT SLP TREATMENT - 06/20/21 0001       Treatment Provided   Treatment provided Cognitive-Linquistic      Cognitive-Linquistic Treatment   Treatment focused on Apraxia;Patient/family/caregiver education    Skilled Treatment Skilled treatment session focused on improving pt's speech intelligibility by targeting pt's apraxia of speech. SLP facilitated session by providng maximal cues to use pacing to control rate, improve speech intelligibility and establish accurate motor speech movements. Pt continued to use pacing in  response to speech breakdowns rather than to establish accurate motor movements. There was some carryovr of slower rate of speech noted in off-the-cuff coments. SLP further facilitated session by using mirror, tactile cues to establish fricative motor movements for /f/ &/v/. Despite this, pt was not successful in motor movements.              SLP Education - 06/19/21 1711     Education Details using pacing to establish accurate motor movements rather than in response to incorrect motor patterns    Person(s) Educated Patient    Methods Explanation;Demonstration;Tactile cues;Verbal cues;Handout    Comprehension Verbalized understanding;Need further instruction;Tactile cues required;Verbal cues required              SLP Short Term Goals - 06/04/21 1536       SLP SHORT TERM GOAL #1   Title Pt will use the speech intelligibility strategy of pacing to slow rate of speech at the simple sentence level in 8 out of 10 opportunities.    Baseline < 25% speech intelligiblity d/t fast rate of speech    Time 10    Period --   sessions   Status New      SLP SHORT TERM GOAL #2   Title Pt will produce fricatives in  the initial, final and medial positions of words with 90% accuracy.    Baseline pt stops all fricatives    Time 10    Period --   sessions   Status New      SLP SHORT TERM GOAL #3   Title Pt will use speech intelligibility strategy of overarticulation to improve speech intelligibility to 75% at the simple sentence level.    Baseline < 25% speech intelligibili    Time 10    Period --   sessions   Status New      SLP SHORT TERM GOAL #4   Title Pt will answer WH questions after reading paragraph level passage with 75% accuracy.    Baseline new goal    Time 10    Period --   sessions   Status New              SLP Long Term Goals - 06/04/21 1540       SLP LONG TERM GOAL #1   Title Pt will use speech intelligibility stategies to achieve 50% speech intelligibility at  the sentence level.    Baseline < 25% speech intelligibility    Time 12    Period Weeks    Status New    Target Date 08/27/21              Plan - 06/19/21 1713     Clinical Impression Statement Pt continues to lack insight into severity and overall apraxia of speech therefore he continues to be depenedent on cues to use pacing to establish correct motor movements. In addition, pt continues with incorrect motor patterns affecting phonemes /f/ & /v/ as well as deletion of /s/ within all consonant clusters. As a result, pt's speech continues to be ~ 50-25% intelligible. Continued skilled ST services continued to be required to improve functional communication for increase participation in family and community activities.    Speech Therapy Frequency 2x / week    Duration 12 weeks    Treatment/Interventions Language facilitation;Compensatory techniques;Cueing hierarchy;Internal/external aids;SLP instruction and feedback;Patient/family education;Compensatory strategies    Potential to Achieve Goals Fair    Potential Considerations Other (comment);Severity of impairments    SLP Home Exercise Plan provided, see pt instructions section    Consulted and Agree with Plan of Care Patient             Patient will benefit from skilled therapeutic intervention in order to improve the following deficits and impairments:   Apraxia  H/O ischemic left MCA stroke    Problem List Patient Active Problem List   Diagnosis Date Noted   Alterations of sensations following cerebrovascular accident 03/08/2021   Aphasia as late effect of cerebrovascular accident (CVA) 03/08/2021   Muscle spasm 02/22/2021   Acute non-recurrent frontal sinusitis 02/22/2021   Chronic pain syndrome 01/23/2021   Primary hypertension 01/23/2021   Nerve pain 01/23/2021   Erectile dysfunction due to diseases classified elsewhere 01/23/2021   Cognitive dysfunction 01/23/2021   Scrotal edema 01/23/2021   Elevated LDL  cholesterol level 01/23/2021   History of stroke with residual deficit 01/23/2021   Primary insomnia 01/23/2021   Mouth pain 01/23/2021   Laceration of right hand without foreign body    Closed compression fracture of L1 vertebra (HCC) 05/24/2016   Lumbar stenosis with neurogenic claudication 11/01/2015   Chronic 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 11/08/2014   Hx of hemorrhoids 11/08/2014   AAA (abdominal aortic aneurysm) (HCC) 09/22/2014   Chronic pain associated  with significant psychosocial dysfunction 09/22/2014   Panic disorder 09/22/2014   AB (asthmatic bronchitis) 08/17/2014   Anxiety 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 08/17/2014   Backache 08/17/2014   Lumbosacral spondylosis without myelopathy 08/17/2014   Disorder of male genital organs 08/17/2014   Brash 08/17/2014   Low back pain 08/17/2014   Tendon nodule 08/17/2014   Episodic paroxysmal anxiety disorder 08/17/2014   Hernia, inguinal, right 08/17/2014   Fast heart beat 08/17/2014   Illness 08/17/2014   Inguinal hernia 10/14/2012   Inetha Maret B. Dreama Saa M.S., CCC-SLP, [facility] Pathologist Rehabilitation Services Office [phone]  [place], [place] 06/20/2021, 12:51 PM  [place] [facility] MAIN [facility] SERVICES [address] [place], [place], [address] Phone: [phone]   Fax:  [phone]   Name: Shailen Thielen MRN: [id number] Date of Birth: 12-15-1970

## 2021-06-19 NOTE — Therapy (Signed)
[place] MAIN [facility] SERVICES [address] [place], [place], [address] Phone: [phone]   Fax:  [phone]  Occupational Therapy Treatment  Patient Details  Name: David Reeves MRN: [id number] Date of Birth: [birth year] Referring Provider (OT): [name]   Encounter Date: 06/19/2021   OT End of Session - 06/19/21 1726     Visit Number 9    Number of Visits 24    Date for OT Re-Evaluation 08/15/21    Authorization Type Progress report period starting 05/23/2021    OT Start Time 1100    OT Stop Time 1145    OT Time Calculation (min) 45 min    Activity Tolerance Patient tolerated treatment well    Behavior During Therapy [facility] for tasks assessed/performed             Past Medical History:  Diagnosis Date   Back pain 04/22/2012   Bone spur    Bulging disc 04/22/2012   Degenerative disc disease    Osteoarthritis    Panic anxiety syndrome    Stroke [facility]) 10/2020   Taking multiple medications for chronic disease     Past Surgical History:  Procedure Laterality Date   CYSTECTOMY     head   HERNIA REPAIR Left 2006,2014   [facility]   TOE SURGERY Right 2007    There were no vitals filed for this visit.   Subjective Assessment - 06/19/21 1723     Subjective  Pt reports his arm feels exhausted today.    Patient is accompanied by: Family member    Pertinent History Pt. is a 51 y.o. who was diagnosed with a CVA on July 21st, 2022. Pt. completed several weeks of  inpatient rehab at [facility]. After returning to home, Pt. sustained a fall in december of 2022, and was admitted to the hospital with COVI-19, and back pain from the fall. Since the most recent discharge, Pt. has been residing with the ex-wife until the Pt. is ready to return to independent living. Pt. PMHx includes: Bilateral LBP with right sided sciatica, Lumbosacral spondylosis myelopathy, closed compression Fx of L1. Pt. enjoys cooking, and riding motorcycles.     Currently in Pain? Yes    Pain Score 4     Pain Location Back    Pain Orientation Lower    Pain Descriptors / Indicators Aching    Pain Type Chronic pain    Pain Radiating Towards low back    Pain Onset More than a month ago    Pain Frequency Intermittent    Aggravating Factors  prolonged positioning, bending over, stooping, reaching    Pain Relieving Factors heat, meds, resting, repositioning, laying down    Effect of Pain on Daily Activities decreased activity tolerance    Multiple Pain Sites No            Occupational Therapy Treatment: Neuro re-ed: Facilitated grasp/release patterns for R hand working to place and remove tall jumbo pegs from pegboard.  Pt was unable to place pegs without assist, but was able to remove pegs using a gross grasp.  Attempted tripod grasp but them repeatedly slipped and pt could not sustain this pattern.  Instructed pt in digit isolation exercises, abd/add with hand flat on table top, performed digit taps for MP ext of all digits, single digit taps, and full hand taps for wrist ext.  Pt requires OT assist to hold alternate digits down when performing a single digit tap, and required active assist to  tap 4th and 5th digits.  Further addressed grasp/release patterns and reaching for Saebo rings, moving rings between first, second, and third level of tower.  Frequent rest breaks needed and required intermittent vc for sequencing grasp/release of rings.  Therapeutic Exercise: Performed PROM for R shoulder flex/abd/horiz abd/add/ER, prolonged stretch for ER with arm abducted to ~80 degrees, working to increase shoulder flexibility for ADLs and reaching.    Response to Treatment: Pt reported RUE "exhaustion" today.  Pt tolerated tx well with rest breaks and intermittent stretching for R shoulder and hand.  Pt continues to work towards advanced prehension patterns to pick up ADL supplies with R hand.  Pt currently using a gross grasp, but practiced tripod grasp  today with min A.  Pt presents with R shoulder stiffness, specifically with ER.  Tolerated prolonged stretching at shoulder today, working to increase reach of R hand behind head.  Pt will continue to benefit from skilled OT to further develop RUE strength and coordination to better engage RUE for self care tasks.     OT Education - 06/19/21 1724     Education Details hand exercises    Person(s) Educated Patient    Methods Explanation;Demonstration;Tactile cues;Verbal cues    Comprehension Verbalized understanding;Returned demonstration;Verbal cues required;Tactile cues required;Need further instruction              OT Short Term Goals - 05/23/21 1219       OT SHORT TERM GOAL #1   Title Pt. wil demonstrtae independence with HEPs    Baseline Eval: Pt. currently does not have one    Time 6    Period Weeks    Status New    Target Date 07/04/21               OT Long Term Goals - 05/23/21 1225       OT LONG TERM GOAL #1   Title Pt. will improve RUE strength by 2 mm grades to assist with ADLs, and IADLs,    Baseline Eval: Right shoulder flexion, abduction 3/5, elbow flexion, extension wrist extension 3+/5,    Time 12    Period Weeks    Status New    Target Date 08/15/21      OT LONG TERM GOAL #2   Title Pt. will improve right grip by 10 lbs to prepare for firmly holding objects for IADLs.    Baseline Eval: R: 38#, L: 124# (In 3rd dynamometer slot)    Time 12    Period Weeks    Status New    Target Date 08/15/21      OT LONG TERM GOAL #3   Title Pt. will improve right lateral pinch strength by 5# to assist with cutting food    Baseline Eval: R: 17, L: 25    Time 12    Period Weeks    Status New    Target Date 08/15/21      OT LONG TERM GOAL #4   Title Pt. will improve Right 3pt pinch by 2# to be able to hold/open items for cooking    Baseline Eval: R: Pt. unable to engage thumb L: 29#    Time 12    Period Weeks    Status New    Target Date 08/15/21       OT LONG TERM GOAL #5   Title Pt. will right hand [facility] skills to be able to independently manipulate buttons, and zippers.    Baseline Eval: Pt. has difficulty  managing buttons,a nd zippers.    Time 12    Period Weeks    Status New    Target Date 08/15/21      Long Term Additional Goals   Additional Long Term Goals Yes      OT LONG TERM GOAL #6   Title Pt. will independently write his name    Baseline Eval: Pt. is unable to hold a pen    Time 12    Period Weeks    Status New    Target Date 05/24/21      OT LONG TERM GOAL #7   Title Pt. will improve FOTO score by 2 points to reflect funational improvement    Baseline Eval: FOT score 46 with TR score 52    Time 12    Period Weeks    Status New    Target Date 08/15/21                 Plan - 06/19/21 1743     Clinical Impression Statement Pt reported RUE "exhaustion" today.  Pt tolerated tx well with rest breaks and intermittent stretching for R shoulder and hand.  Pt continues to work towards advanced prehension patterns to pick up ADL supplies with R hand.  Pt currently using a gross grasp, but practiced tripod grasp today with min A.  Pt presents with R shoulder stiffness, specifically with ER.  Tolerated prolonged stretching at shoulder today, working to increase reach of R hand behind head.  Pt will continue to benefit from skilled OT to further develop RUE strength and coordination to better engage RUE for self care tasks.    OT Occupational Profile and History Detailed Assessment- Review of Records and additional review of physical, cognitive, psychosocial history related to current functional performance    Occupational performance deficits (Please refer to evaluation for details): ADL's;IADL's;Education    Rehab Potential Good    Clinical Decision Making Several treatment options, min-mod task modification necessary    Comorbidities Affecting Occupational Performance: May have comorbidities impacting occupational  performance    Modification or Assistance to Complete Evaluation  Min-Moderate modification of tasks or assist with assess necessary to complete eval    OT Frequency 2x / week    OT Duration 12 weeks    OT Treatment/Interventions Self-care/ADL training;DME and/or AE instruction;Therapeutic exercise;Ultrasound;Neuromuscular education;Therapeutic activities;Energy conservation;Moist Heat;Patient/family education;Splinting;Functional Mobility Training;Paraffin    Consulted and Agree with Plan of Care Patient             Patient will benefit from skilled therapeutic intervention in order to improve the following deficits and impairments:           Visit Diagnosis: Apraxia  Muscle weakness (generalized)  Other lack of coordination    Problem List Patient Active Problem List   Diagnosis Date Noted   Alterations of sensations following cerebrovascular accident 03/08/2021   Aphasia as late effect of cerebrovascular accident (CVA) 03/08/2021   Muscle spasm 02/22/2021   Acute non-recurrent frontal sinusitis 02/22/2021   Chronic pain syndrome 01/23/2021   Primary hypertension 01/23/2021   Nerve pain 01/23/2021   Erectile dysfunction due to diseases classified elsewhere 01/23/2021   Cognitive dysfunction 01/23/2021   Scrotal edema 01/23/2021   Elevated LDL cholesterol level 01/23/2021   History of stroke with residual deficit 01/23/2021   Primary insomnia 01/23/2021   Mouth pain 01/23/2021   Laceration of right hand without foreign body    Closed compression fracture of L1 vertebra ([place]) 05/24/2016   Lumbar  stenosis with neurogenic claudication 11/01/2015   Chronic 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 11/08/2014   Hx of hemorrhoids 11/08/2014   AAA (abdominal aortic aneurysm) ([place]) 09/22/2014   Chronic pain associated with significant psychosocial dysfunction 09/22/2014   Panic disorder 09/22/2014   AB (asthmatic bronchitis) 08/17/2014   Anxiety disorder due to  general medical condition 08/17/2014   Backache 08/17/2014   Lumbosacral spondylosis without myelopathy 08/17/2014   Disorder of male genital organs 08/17/2014   Brash 08/17/2014   Low back pain 08/17/2014   Tendon nodule 08/17/2014   Episodic paroxysmal anxiety disorder 08/17/2014   Hernia, inguinal, right 08/17/2014   Fast heart beat 08/17/2014   Illness 08/17/2014   Inguinal hernia 10/14/2012   Leta Speller, MS, OTR/L  Darleene Cleaver, OT 06/19/2021, 5:43 PM  [place] MAIN [facility] SERVICES [address] [place], [place], [address] Phone: [phone]   Fax:  [phone]  Name: David Reeves MRN: [id number] Date of Birth: 1971-01-21

## 2021-06-20 ENCOUNTER — Ambulatory Visit: Payer: Medicare HMO | Admitting: Occupational Therapy

## 2021-06-21 ENCOUNTER — Ambulatory Visit: Payer: Medicare HMO

## 2021-06-21 ENCOUNTER — Ambulatory Visit: Payer: Medicare HMO | Admitting: Physical Therapy

## 2021-06-21 ENCOUNTER — Ambulatory Visit: Payer: Medicare HMO | Attending: Physician Assistant | Admitting: Occupational Therapy

## 2021-06-21 ENCOUNTER — Other Ambulatory Visit: Payer: Self-pay

## 2021-06-21 DIAGNOSIS — R262 Difficulty in walking, not elsewhere classified: Secondary | ICD-10-CM | POA: Diagnosis not present

## 2021-06-21 DIAGNOSIS — R2689 Other abnormalities of gait and mobility: Secondary | ICD-10-CM | POA: Diagnosis not present

## 2021-06-21 DIAGNOSIS — R482 Apraxia: Secondary | ICD-10-CM | POA: Diagnosis not present

## 2021-06-21 DIAGNOSIS — R4701 Aphasia: Secondary | ICD-10-CM | POA: Insufficient documentation

## 2021-06-21 DIAGNOSIS — M6281 Muscle weakness (generalized): Secondary | ICD-10-CM

## 2021-06-21 DIAGNOSIS — M545 Low back pain, unspecified: Secondary | ICD-10-CM | POA: Insufficient documentation

## 2021-06-21 DIAGNOSIS — G8929 Other chronic pain: Secondary | ICD-10-CM | POA: Diagnosis present

## 2021-06-21 DIAGNOSIS — R269 Unspecified abnormalities of gait and mobility: Secondary | ICD-10-CM | POA: Diagnosis not present

## 2021-06-21 DIAGNOSIS — R278 Other lack of coordination: Secondary | ICD-10-CM | POA: Diagnosis not present

## 2021-06-21 DIAGNOSIS — R2681 Unsteadiness on feet: Secondary | ICD-10-CM | POA: Diagnosis not present

## 2021-06-21 DIAGNOSIS — Z8673 Personal history of transient ischemic attack (TIA), and cerebral infarction without residual deficits: Secondary | ICD-10-CM | POA: Insufficient documentation

## 2021-06-21 NOTE — Therapy (Signed)
[place] MAIN [facility] SERVICES [address] [place], [place], [address] Phone: [phone]   Fax:  [phone]  Physical Therapy Treatment  Patient Details  Name: David Reeves MRN: [id number] Date of Birth: 03-08-1971 Referring Provider (PT): [name] PA   Encounter Date: 06/21/2021   PT End of Session - 06/21/21 1918     Visit Number 5    Number of Visits 17    Date for PT Re-Evaluation 07/25/21    Authorization Type [employer] 05/30/21-07/25/21    PT Start Time [id number]    PT Stop Time 1228    PT Time Calculation (min) 42 min    Equipment Utilized During Treatment Gait belt    Activity Tolerance Patient tolerated treatment well;Patient limited by pain    Behavior During Therapy Flat affect             Past Medical History:  Diagnosis Date   Back pain 04/22/2012   Bone spur    Bulging disc 04/22/2012   Degenerative disc disease    Osteoarthritis    Panic anxiety syndrome    Stroke [facility]) 10/2020   Taking multiple medications for chronic disease     Past Surgical History:  Procedure Laterality Date   CYSTECTOMY     head   HERNIA REPAIR Left 2006,2014   [facility]   TOE SURGERY Right 2007    There were no vitals filed for this visit.   Subjective Assessment - 06/21/21 1917     Subjective Patient reports feeling okay- reports back is sore and right side is weak from CVA but states he is working on prescribed exercises.    Pertinent History Pt. is a 50 y.o. who was diagnosed with a CVA on July 21st, 2022. Pt. completed several weeks of  inpatient rehab at [facility] After returning to home. He was discharged in late August/early September and recieved home health PT. Pt. sustained a fall in december of 2022, and was admitted to the hospital with COVID-19, and Chronic back pain. He reports chronic back pain syndrome since 2012. Since the most recent discharge, Pt. has been residing with the ex-wife until the Pt. is ready to  return to independent living. He did recieve 2 weeks of home health after discharge in December 2022. He is now being referred to outpatient PT to address weakness from stroke and improve fine motor movement. He reports rarely getting numbness/tingling in RLE. PMHx includes: Bilateral LBP with right sided sciatica, Lumbosacral spondylosis myelopathy, closed compression Fx of L1. Has chronic insomnia. osteoarthritis, Panic anxiety syndrome (taking medication) Pt. enjoys cooking, and riding motorcycles. Patient is going to [facility] for Epidural spinal injections on 06/07/21;    Limitations Sitting;Walking    How long can you sit comfortably? 20 min;    How long can you stand comfortably? 5-10 min;    How long can you walk comfortably? able to walk long ways (>500 feet)    Diagnostic tests MRI Nov 2022- Stable chronic burst L1 fracture, progressive disc degeneration with disc bulging  eccentric to the left at L3-4 and L4-5. spinal stenosis at L5-S1;    Patient Stated Goals "I Need all the help  I can get. I want to move around better and reduce my back pain."    Currently in Pain? Yes    Pain Score 5     Pain Location Back    Pain Descriptors / Indicators Aching    Pain Type Chronic pain  Pain Onset More than a month ago    Pain Frequency Intermittent             INTERVENTIONS:    Manual therapy: Patient lying in supine with moist heat to low back.    Hamstring stretch (BLE) x 30 sec x 4 each leg.  Knee to chest (BLE) x 30 sec x 4 each leg Lower trunk rotation- BLE x 30 sec x 4 each LE Manual hip circles (CW/CCW) x 20 -25 reps each direction and each LE Pirifomis x 30 sec x 3 sets each LE     Therex:    Bridge with 3 sec hold x 15 reps- VC to hold for 3 sec TA contraction with leg lifts x 10 reps each leg TA Contraction with hip ABD using GTB x 12 reps.  *issued GTB for HEP today. Seated Lumbar flex using blue theraball x 15 reps.     Education provided throughout  session via VC/TC and demonstration to facilitate movement at target joints and correct muscle activation for all testing and exercises performed.                            PT Education - 06/21/21 1918     Education Details Exercise technique    Person(s) Educated Patient    Methods Explanation;Demonstration;Tactile cues;Verbal cues    Comprehension Verbalized understanding;Returned demonstration;Verbal cues required;Tactile cues required;Need further instruction              PT Short Term Goals - 05/31/21 1018       PT SHORT TERM GOAL #1   Title Patient will be adherent to HEP at least 3x a week to improve functional strength and balance for better safety at home.    Time 4    Period Weeks    Status New    Target Date 06/28/21      PT SHORT TERM GOAL #2   Title Patient (< 62 years old) will complete five times sit to stand test in < 15 seconds indicating an increased LE strength and improved balance.    Time 4    Period Weeks    Status New    Target Date 06/28/21               PT Long Term Goals - 05/31/21 1019       PT LONG TERM GOAL #1   Title Patient will increase six minute walk test distance to >1000 for progression to community ambulator and improve gait ability    Time 8    Period Weeks    Status New    Target Date 07/25/21      PT LONG TERM GOAL #2   Title Patient will ascend/descend 8 stairs without rail assist independently without loss of balance to improve ability to get in/out of home.    Time 8    Period Weeks    Status New    Target Date 07/25/21      PT LONG TERM GOAL #3   Title Patient will increase BLE gross strength to 4+/5 as to improve functional strength for independent gait, increased standing tolerance and increased ADL ability.    Time 8    Period Weeks    Status New    Target Date 07/25/21      PT LONG TERM GOAL #4   Title Patient will improve FOTO score to >50% to indicate improved functional mobility  with less pain with ADLs.    Time 8    Period Weeks    Status New    Target Date 07/25/21      PT LONG TERM GOAL #5   Title Patient will report a worst pain of 4/10 in low back over last week to indicate improved tolerance with ADLs.    Time 8    Period Weeks    Status New    Target Date 07/26/21                   Plan - 06/21/21 1916     Clinical Impression Statement Patient responded well today with manual techniques. He reported feeling better after stretching. He was also responsive to all VC and instruction in core/low back exercises today. No report of any increased pain. Patient would benefit from additional skilled PT Intervention to improve strength and mobility    Personal Factors and Comorbidities Comorbidity 3+;Past/Current Experience;Time since onset of injury/illness/exacerbation    Comorbidities PMHx includes: Bilateral LBP with right sided sciatica, Lumbosacral spondylosis myelopathy, closed compression Fx of L1. Has chronic insomnia. osteoarthritis, Panic anxiety syndrome (taking medication)    Examination-Activity Limitations Bend;Carry;Locomotion Level;Sit;Squat;Stairs;Stand;Transfers    Examination-Participation Restrictions Cleaning;Community Activity;Driving;Laundry;Meal Prep;Occupation;Shop;Volunteer;Yard Work    Stability/Clinical Decision Making Stable/Uncomplicated    Rehab Potential Good    PT Frequency 2x / week    PT Duration 8 weeks    PT Treatment/Interventions Cryotherapy;Electrical Stimulation;Moist Heat;Traction;Gait training;Stair training;Functional mobility training;Therapeutic activities;Therapeutic exercise;Balance training;Neuromuscular re-education;Patient/family education;Manual techniques;Passive range of motion;Dry needling;Energy conservation    PT Next Visit Plan Progress core stabilization, Manual therapy for low back and LE ROM    Consulted and Agree with Plan of Care Patient             Patient will benefit from skilled  therapeutic intervention in order to improve the following deficits and impairments:  Abnormal gait, Decreased endurance, Decreased mobility, Difficulty walking, Increased muscle spasms, Decreased range of motion, Decreased activity tolerance, Decreased strength, Postural dysfunction, Pain  Visit Diagnosis: Muscle weakness (generalized)  Difficulty in walking, not elsewhere classified  Chronic bilateral low back pain without sciatica     Problem List Patient Active Problem List   Diagnosis Date Noted   Alterations of sensations following cerebrovascular accident 03/08/2021   Aphasia as late effect of cerebrovascular accident (CVA) 03/08/2021   Muscle spasm 02/22/2021   Acute non-recurrent frontal sinusitis 02/22/2021   Chronic pain syndrome 01/23/2021   Primary hypertension 01/23/2021   Nerve pain 01/23/2021   Erectile dysfunction due to diseases classified elsewhere 01/23/2021   Cognitive dysfunction 01/23/2021   Scrotal edema 01/23/2021   Elevated LDL cholesterol level 01/23/2021   History of stroke with residual deficit 01/23/2021   Primary insomnia 01/23/2021   Mouth pain 01/23/2021   Laceration of right hand without foreign body    Closed compression fracture of L1 vertebra ([place]) 05/24/2016   Lumbar stenosis with neurogenic claudication 11/01/2015   Chronic 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 11/08/2014   Hx of hemorrhoids 11/08/2014   AAA (abdominal aortic aneurysm) ([place]) 09/22/2014   Chronic pain associated with significant psychosocial dysfunction 09/22/2014   Panic disorder 09/22/2014   AB (asthmatic bronchitis) 08/17/2014   Anxiety 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 08/17/2014   Backache 08/17/2014   Lumbosacral spondylosis without myelopathy 08/17/2014   Disorder of male genital organs 08/17/2014   Brash 08/17/2014   Low back pain 08/17/2014   Tendon nodule 08/17/2014   Episodic paroxysmal anxiety  disorder 08/17/2014   Hernia, inguinal,  right 08/17/2014   Fast heart beat 08/17/2014   Illness 08/17/2014   Inguinal hernia 10/14/2012    Lewis Moccasin, PT 06/21/2021, 7:22 PM  [place] MAIN [facility] SERVICES [address] [place], [place], [address] Phone: [phone]   Fax:  [phone]  Name: David Reeves MRN: [id number] Date of Birth: July 28, 1970

## 2021-06-22 ENCOUNTER — Encounter: Payer: Self-pay | Admitting: Occupational Therapy

## 2021-06-22 NOTE — Therapy (Signed)
[place] MAIN [facility] SERVICES [address] [place], [place], [address] Phone: [phone]   Fax:  [phone]  Occupational Therapy Treatment/Progress Update Reporting period from 05/23/2021 to 06/22/2021   Patient Details  Name: David Reeves MRN: [id number] Date of Birth: Jul 15, 1970 Referring Provider (OT): [name]   Encounter Date: 06/21/2021   OT End of Session - 06/22/21 1145     Visit Number 10    Number of Visits 24    Date for OT Re-Evaluation 08/15/21    Authorization Type Progress report period starting 06/22/2021    OT Start Time 1100    OT Stop Time 1145    OT Time Calculation (min) 45 min    Activity Tolerance Patient tolerated treatment well    Behavior During Therapy [facility] for tasks assessed/performed             Past Medical History:  Diagnosis Date   Back pain 04/22/2012   Bone spur    Bulging disc 04/22/2012   Degenerative disc disease    Osteoarthritis    Panic anxiety syndrome    Stroke [facility]) 10/2020   Taking multiple medications for chronic disease     Past Surgical History:  Procedure Laterality Date   CYSTECTOMY     head   HERNIA REPAIR Left 2006,2014   [facility]   TOE SURGERY Right 2007    There were no vitals filed for this visit.   Subjective Assessment - 06/22/21 1137     Subjective  Pt reports his arm is feeling better today, he was exhausted last time after PT.  Today's session is before PT.  Reports 5/10 pain in back this date.    Pertinent History Pt. is a 51 y.o. who was diagnosed with a CVA on July 21st, 2022. Pt. completed several weeks of  inpatient rehab at [facility] After returning to home, Pt. sustained a fall in december of 2022, and was admitted to the hospital with COVI-19, and back pain from the fall. Since the most recent discharge, Pt. has been residing with the ex-wife until the Pt. is ready to return to independent living. Pt. PMHx includes: Bilateral LBP with right sided  sciatica, Lumbosacral spondylosis myelopathy, closed compression Fx of L1. Pt. enjoys cooking, and riding motorcycles.    Patient Stated Goals Pt would like to be as independent as he was before, improve hand function.    Currently in Pain? Yes    Pain Score 5     Pain Location Back    Pain Orientation Lower    Pain Descriptors / Indicators Aching    Pain Type Chronic pain    Pain Onset More than a month ago    Pain Frequency Intermittent             Therapeutic Exercise: Pt seen for finger strengthening with use of resistive yellow pinch pins, therapist assist to place thumb and fingers for lateral pinch, able to perform 5 reps at a time for 3 sets with short rest breaks in between.   Gross grasp and release of [place] discs able to pick up and release 7 to place into bucket.  Pt attempting to remove Resistive velcro squares from board, cues for prehension and grip patterns,  difficulty placing items back onto board. Gross grasp and release of large saebo balls with PVC bases, therapist assisting pt to place hand onto ball with web space opened.  Repetitive trials completed with verbal and tactile feedback regarding grasp/grip  Goals updated to reflect progress.  Response to tx: Pt continues to make good progress with right UE, he is limited by back pain and requires modifications with alternating from sitting and standing activities.  Use of hi low table this date for tasks.  Pt pleased with progress this date, reports it has been the first time being able to grasp select items and demonstrate control.  He is only able to perform up to 7 reps at a time this date and does require mod cues for prehension and gripping patterns.  Pt demonstrates difficulty with modulation of graded pressure especially at thumb. Requires stretch to open web space of right hand. Pt continues to benefit from skilled OT services to maximize safety and independence in necessary daily tasks.                       OT Education - 06/22/21 1144     Education Details hand exercises, grasping patterns    Person(s) Educated Patient    Methods Explanation;Demonstration;Tactile cues;Verbal cues    Comprehension Verbalized understanding;Returned demonstration;Verbal cues required;Tactile cues required;Need further instruction              OT Short Term Goals - 06/22/21 1147       OT SHORT TERM GOAL #1   Title Pt. wil demonstrtae independence with HEPs    Baseline Eval: Pt. currently does not have one, 10th visit:  changes in HEP ongoing    Time 6    Period Weeks    Status On-going    Target Date 07/04/21               OT Long Term Goals - 06/22/21 1147       OT LONG TERM GOAL #1   Title Pt. will improve RUE strength by 2 mm grades to assist with ADLs, and IADLs,    Baseline Eval: Right shoulder flexion, abduction 3/5, elbow flexion, extension wrist extension 3+/5, 10th visit: improving with RUE strength by not yet met goal    Time 12    Period Weeks    Status On-going    Target Date 08/15/21      OT LONG TERM GOAL #2   Title Pt. will improve right grip by 10 lbs to prepare for firmly holding objects for IADLs.    Baseline Eval: R: 38#, L: 124# (In 3rd dynamometer slot)    Time 12    Period Weeks    Status On-going    Target Date 08/15/21      OT LONG TERM GOAL #3   Title Pt. will improve right lateral pinch strength by 5# to assist with cutting food    Baseline Eval: R: 17, L: 25    Time 12    Period Weeks    Status On-going    Target Date 08/15/21      OT LONG TERM GOAL #4   Title Pt. will improve Right 3pt pinch by 2# to be able to hold/open items for cooking    Baseline Eval: R: Pt. unable to engage thumb L: 29#    Time 12    Period Weeks    Status On-going    Target Date 08/15/21      OT LONG TERM GOAL #5   Title Pt. will right hand [facility] skills to be able to independently manipulate buttons, and zippers.    Baseline Eval: Pt.  has difficulty managing buttons,a nd zippers. 10th visit:  improving  Time 12    Period Weeks    Status On-going    Target Date 08/15/21      OT LONG TERM GOAL #6   Title Pt. will independently write his name    Baseline Eval: Pt. is unable to hold a pen    Time 12    Period Weeks    Status On-going    Target Date 05/24/21      OT LONG TERM GOAL #7   Title Pt. will improve FOTO score by 2 points to reflect funational improvement    Baseline Eval: FOT score 46 with TR score 52    Time 12    Period Weeks    Status On-going    Target Date 08/15/21                   Plan - 06/22/21 1145     Clinical Impression Statement t continues to make good progress with right UE, he is limited by back pain and requires modifications with alternating from sitting and standing activities.  Use of hi low table this date for tasks.  Pt pleased with progress this date, reports it has been the first time being able to grasp select items and demonstrate some control.  He is only able to perform up to 7 reps at a time this date and does require mod cues for prehension and gripping patterns.  Pt demonstrates difficulty with modulation of graded pressure especially at thumb. Requires stretch to open web space of right hand. Pt continues to benefit from skilled OT services to maximize safety and independence in necessary daily tasks.    OT Occupational Profile and History Detailed Assessment- Review of Records and additional review of physical, cognitive, psychosocial history related to current functional performance    Occupational performance deficits (Please refer to evaluation for details): ADL's;IADL's;Education    Rehab Potential Good    Clinical Decision Making Several treatment options, min-mod task modification necessary    Comorbidities Affecting Occupational Performance: May have comorbidities impacting occupational performance    Modification or Assistance to Complete Evaluation   Min-Moderate modification of tasks or assist with assess necessary to complete eval    OT Frequency 2x / week    OT Duration 12 weeks    OT Treatment/Interventions Self-care/ADL training;DME and/or AE instruction;Therapeutic exercise;Ultrasound;Neuromuscular education;Therapeutic activities;Energy conservation;Moist Heat;Patient/family education;Splinting;Functional Mobility Training;Paraffin    Consulted and Agree with Plan of Care Patient             Patient will benefit from skilled therapeutic intervention in order to improve the following deficits and impairments:           Visit Diagnosis: Muscle weakness (generalized)  Other lack of coordination  Apraxia    Problem List Patient Active Problem List   Diagnosis Date Noted   Alterations of sensations following cerebrovascular accident 03/08/2021   Aphasia as late effect of cerebrovascular accident (CVA) 03/08/2021   Muscle spasm 02/22/2021   Acute non-recurrent frontal sinusitis 02/22/2021   Chronic pain syndrome 01/23/2021   Primary hypertension 01/23/2021   Nerve pain 01/23/2021   Erectile dysfunction due to diseases classified elsewhere 01/23/2021   Cognitive dysfunction 01/23/2021   Scrotal edema 01/23/2021   Elevated LDL cholesterol level 01/23/2021   History of stroke with residual deficit 01/23/2021   Primary insomnia 01/23/2021   Mouth pain 01/23/2021   Laceration of right hand without foreign body    Closed compression fracture of L1 vertebra ([place]) 05/24/2016   Lumbar stenosis  with neurogenic claudication 11/01/2015   Chronic 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 11/08/2014   Hx of hemorrhoids 11/08/2014   AAA (abdominal aortic aneurysm) ([place]) 09/22/2014   Chronic pain associated with significant psychosocial dysfunction 09/22/2014   Panic disorder 09/22/2014   AB (asthmatic bronchitis) 08/17/2014   Anxiety 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 08/17/2014   Backache 08/17/2014    Lumbosacral spondylosis without myelopathy 08/17/2014   Disorder of male genital organs 08/17/2014   Brash 08/17/2014   Low back pain 08/17/2014   Tendon nodule 08/17/2014   Episodic paroxysmal anxiety disorder 08/17/2014   Hernia, inguinal, right 08/17/2014   Fast heart beat 08/17/2014   Illness 08/17/2014   Inguinal hernia 10/14/2012   Ever Halberg T Tomasita Morrow, OTR/L, CLT  Toure Edmonds, OT 06/22/2021, 12:28 PM  [place] MAIN [facility] SERVICES [address] [place], [place], [address] Phone: [phone]   Fax:  [phone]  Name: Thiago Ragsdale MRN: [id number] Date of Birth: Apr 08, 1971

## 2021-06-23 ENCOUNTER — Encounter: Payer: Self-pay | Admitting: Physician Assistant

## 2021-06-24 ENCOUNTER — Other Ambulatory Visit: Payer: Self-pay | Admitting: Family Medicine

## 2021-06-24 DIAGNOSIS — F5101 Primary insomnia: Secondary | ICD-10-CM

## 2021-06-25 ENCOUNTER — Ambulatory Visit: Payer: Medicare HMO

## 2021-06-25 ENCOUNTER — Encounter: Payer: Self-pay | Admitting: Occupational Therapy

## 2021-06-25 ENCOUNTER — Ambulatory Visit: Payer: Medicare HMO | Admitting: Occupational Therapy

## 2021-06-25 ENCOUNTER — Other Ambulatory Visit: Payer: Self-pay

## 2021-06-25 DIAGNOSIS — M6281 Muscle weakness (generalized): Secondary | ICD-10-CM | POA: Diagnosis not present

## 2021-06-25 DIAGNOSIS — G8929 Other chronic pain: Secondary | ICD-10-CM

## 2021-06-25 DIAGNOSIS — Z8673 Personal history of transient ischemic attack (TIA), and cerebral infarction without residual deficits: Secondary | ICD-10-CM | POA: Diagnosis not present

## 2021-06-25 DIAGNOSIS — R262 Difficulty in walking, not elsewhere classified: Secondary | ICD-10-CM | POA: Diagnosis not present

## 2021-06-25 DIAGNOSIS — R4701 Aphasia: Secondary | ICD-10-CM | POA: Diagnosis not present

## 2021-06-25 DIAGNOSIS — R278 Other lack of coordination: Secondary | ICD-10-CM | POA: Diagnosis not present

## 2021-06-25 DIAGNOSIS — R269 Unspecified abnormalities of gait and mobility: Secondary | ICD-10-CM | POA: Diagnosis not present

## 2021-06-25 DIAGNOSIS — R2681 Unsteadiness on feet: Secondary | ICD-10-CM | POA: Diagnosis not present

## 2021-06-25 DIAGNOSIS — R2689 Other abnormalities of gait and mobility: Secondary | ICD-10-CM | POA: Diagnosis not present

## 2021-06-25 DIAGNOSIS — R482 Apraxia: Secondary | ICD-10-CM | POA: Diagnosis not present

## 2021-06-25 DIAGNOSIS — M545 Low back pain, unspecified: Secondary | ICD-10-CM

## 2021-06-25 NOTE — Therapy (Signed)
[place] [facility] [address] [place], [place], [address] Phone: [phone]   Fax:  [phone]  Physical Therapy Treatment  Patient Details  Name: David Reeves MRN: [id number] Date of Birth: 1970/06/24 Referring Provider (PT): Alfredia Fergusonrubel, Lindsay PA   Encounter Date: 06/25/2021   PT End of Session - 06/25/21 0939     Visit Number 6    Number of Visits 17    Date for PT Re-Evaluation 07/25/21    Authorization Type [employer] 05/30/21-07/25/21    PT Start Time 0930    PT Stop Time 1010    PT Time Calculation (min) 40 min    Equipment Utilized During Treatment Gait belt    Activity Tolerance Patient tolerated treatment well;Patient limited by pain    Behavior [facility] for tasks assessed/performed             Past Medical History:  Diagnosis Date   Back pain 04/22/2012   Bone spur    Bulging disc 04/22/2012   Degenerative disc disease    Osteoarthritis    Panic anxiety syndrome    Stroke [facility] 10/2020   Taking multiple medications for chronic disease     Past Surgical History:  Procedure Laterality Date   CYSTECTOMY     head   HERNIA REPAIR Left 2006,2014   [facility]   TOE SURGERY Right 2007    There were no vitals filed for this visit.   Subjective Assessment - 06/25/21 0936     Subjective Patient reports feeling okay- back is sore but only at a 3/10.    Pertinent History Pt. is a 51 y.o. who was diagnosed with a CVA on July 21st, 2022. Pt. [facility] After returning to home. He was discharged in late August/early September and recieved home health PT. Pt. sustained a fall in december of 2022, and was admitted to the hospital with COVID-19, and Chronic back pain. He reports chronic back pain syndrome since 2012. Since the most recent discharge, Pt. has been residing with the ex-wife until the Pt. is ready to return to independent living. He did recieve 2  weeks of home health after discharge in December 2022. He is now being referred to outpatient PT to address weakness from stroke and improve fine motor movement. He reports rarely getting numbness/tingling in RLE. PMHx includes: Bilateral LBP with right sided sciatica, Lumbosacral spondylosis myelopathy, closed compression Fx of L1. Has chronic insomnia. osteoarthritis, Panic anxiety syndrome (taking medication) Pt. enjoys cooking, and riding motorcycles. [facility] on 06/07/21;    Limitations Sitting;Walking    How long can you sit comfortably? 20 min;    How long can you stand comfortably? 5-10 min;    How long can you walk comfortably? able to walk long ways (>500 feet)    Diagnostic tests MRI Nov 2022- Stable chronic burst L1 fracture, progressive disc degeneration with disc bulging  eccentric to the left at L3-4 and L4-5. spinal stenosis at L5-S1;    Patient Stated Goals "I Need all the help  I can get. I want to move around better and reduce my back pain."    Currently in Pain? Yes    Pain Score 3     Pain Location Back    Pain Orientation Posterior;Lower    Pain Descriptors / Indicators Aching;Sore    Pain Type Chronic pain  Pain Onset More than a month ago    Pain Frequency Intermittent    Aggravating Factors  prolonged positioning, bending over, Stooping, reaching    Pain Relieving Factors Heat, Meds, Restin, repositioning, laying down              INTERVENTIONS:    Manual therapy: Patient lying in supine with moist heat to low back.    Hamstring stretch (BLE) x 30 sec x 4 each leg.  Knee to chest (BLE) x 30 sec x 4 each leg Lower trunk rotation- BLE x 30 sec x 4 each LE Manual hip circles (CW/CCW) x 20 -25 reps each direction and each LE Pirifomis x 30 sec x 3 sets each LE     Therex:   Bridge with 3 sec hold x 12 reps (VC for technique)   Instructed patient in quadraped position with PT (supervising right  UE) -  hold x 1 min then  then performed alternating UE raises x 12 reps - rested then perform alt LE (hip ext) x 12 - No report of increased pain- just mild difficulty with coordinating tasks.   Patient then instructed in low plank position with assistance to attain position however patient unable to attain position well enough for effective exercise today.   Education provided throughout session via VC/TC and demonstration to facilitate movement at target joints and correct muscle activation for all testing and exercises performed.                                PT Short Term Goals - 05/31/21 1018       PT SHORT TERM GOAL #1   Title Patient will be adherent to HEP at least 3x a week to improve functional strength and balance for better safety at home.    Time 4    Period Weeks    Status New    Target Date 06/28/21      PT SHORT TERM GOAL #2   Title Patient (< 44 years old) will complete five times sit to stand test in < 15 seconds indicating an increased LE strength and improved balance.    Time 4    Period Weeks    Status New    Target Date 06/28/21               PT Long Term Goals - 05/31/21 1019       PT LONG TERM GOAL #1   Title Patient will increase six minute walk test distance to >1000 for progression to community ambulator and improve gait ability    Time 8    Period Weeks    Status New    Target Date 07/25/21      PT LONG TERM GOAL #2   Title Patient will ascend/descend 8 stairs without rail assist independently without loss of balance to improve ability to get in/out of home.    Time 8    Period Weeks    Status New    Target Date 07/25/21      PT LONG TERM GOAL #3   Title Patient will increase BLE gross strength to 4+/5 as to improve functional strength for independent gait, increased standing tolerance and increased ADL ability.    Time 8    Period Weeks    Status New    Target Date 07/25/21      PT LONG TERM GOAL #4    Title  Patient will improve FOTO score to >50% to indicate improved functional mobility with less pain with ADLs.    Time 8    Period Weeks    Status New    Target Date 07/25/21      PT LONG TERM GOAL #5   Title Patient will report a worst pain of 4/10 in low back over last week to indicate improved tolerance with ADLs.    Time 8    Period Weeks    Status New    Target Date 07/26/21                   Plan - 06/25/21 1010     Clinical Impression Statement Patient reported feeling much better and not as tight on right side after stretching. He reported feeling better with walking after stretching and stated the quadraped position was tough but a good workout. Patient would benefit from additional skilled PT Intervention to improve strength and mobility.    Personal Factors and Comorbidities Comorbidity 3+;Past/Current Experience;Time since onset of injury/illness/exacerbation    Comorbidities PMHx includes: Bilateral LBP with right sided sciatica, Lumbosacral spondylosis myelopathy, closed compression Fx of L1. Has chronic insomnia. osteoarthritis, Panic anxiety syndrome (taking medication)    Examination-Activity Limitations Bend;Carry;Locomotion Level;Sit;Squat;Stairs;Stand;Transfers    Examination-Participation Restrictions Cleaning;Community Activity;Driving;Laundry;Meal Prep;Occupation;Shop;Volunteer;Yard Work    Stability/Clinical Decision Making Stable/Uncomplicated    Rehab Potential Good    PT Frequency 2x / week    PT Duration 8 weeks    PT Treatment/Interventions Cryotherapy;Electrical Stimulation;Moist Heat;Traction;Gait training;Stair training;Functional mobility training;Therapeutic activities;Therapeutic exercise;Balance training;Neuromuscular re-education;Patient/family education;Manual techniques;Passive range of motion;Dry needling;Energy conservation    PT Next Visit Plan Progress core stabilization, Manual therapy for low back and LE ROM    Consulted and Agree  with Plan of Care Patient             Patient will benefit from skilled therapeutic intervention in order to improve the following deficits and impairments:  Abnormal gait, Decreased endurance, Decreased mobility, Difficulty walking, Increased muscle spasms, Decreased range of motion, Decreased activity tolerance, Decreased strength, Postural dysfunction, Pain  Visit Diagnosis: Muscle weakness (generalized)  Difficulty in walking, not elsewhere classified  Chronic bilateral low back pain without sciatica     Problem List Patient Active Problem List   Diagnosis Date Noted   Alterations of sensations following cerebrovascular accident 03/08/2021   Aphasia as late effect of cerebrovascular accident (CVA) 03/08/2021   Muscle spasm 02/22/2021   Acute non-recurrent frontal sinusitis 02/22/2021   Chronic pain syndrome 01/23/2021   Primary hypertension 01/23/2021   Nerve pain 01/23/2021   Erectile dysfunction due to diseases classified elsewhere 01/23/2021   Cognitive dysfunction 01/23/2021   Scrotal edema 01/23/2021   Elevated LDL cholesterol level 01/23/2021   History of stroke with residual deficit 01/23/2021   Primary insomnia 01/23/2021   Mouth pain 01/23/2021   Laceration of right hand without foreign body    Closed compression fracture of L1 vertebra (H[facility]) 05/24/2016   Lumbar stenosis with neurogenic claudication 11/01/2015   Chronic 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 11/08/2014   Hx of hemorrhoids 11/08/2014   AAA (abdominal aortic aneurysm) ([facility]) 09/22/2014   Chronic pain associated with significant psychosocial dysfunction 09/22/2014   Panic disorder 09/22/2014   AB (asthmatic bronchitis) 08/17/2014   Anxiety 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 08/17/2014   Backache 08/17/2014   Lumbosacral spondylosis without myelopathy 08/17/2014   Disorder of male genital organs 08/17/2014   Brash 08/17/2014   Low back  pain 08/17/2014   Tendon nodule  08/17/2014   Episodic paroxysmal anxiety disorder 08/17/2014   Hernia, inguinal, right 08/17/2014   Fast heart beat 08/17/2014   Illness 08/17/2014   Inguinal hernia 10/14/2012    Lenda Kelp, PT 06/25/2021, 4:58 PM  [place] [facility] MAIN [facility] SERVICES [address] [place], [place], [address] Phone: [phone]   Fax:  [phone]  Name: David Reeves MRN: [id number] Date of Birth: [birth year]

## 2021-06-25 NOTE — Therapy (Signed)
[facility] MAIN [facility] SERVICES [address] [place], [place], [address] Phone: [phone]   Fax:  [phone]  Occupational Therapy Treatment  Patient Details  Name: David Reeves MRN: [id number] Date of Birth: [birth year] Referring Provider (OT): Alfredia Ferguson   Encounter Date: 06/25/2021   OT End of Session - 06/25/21 0941     Visit Number 11    Number of Visits 24    Date for OT Re-Evaluation 08/15/21    Authorization Type Progress report period starting 06/22/2021    OT Start Time 0850    OT Stop Time 0930    OT Time Calculation (min) 40 min    Activity Tolerance Patient tolerated treatment well    Behavior During Therapy [facility] for tasks assessed/performed             Past Medical History:  Diagnosis Date   Back pain 04/22/2012   Bone spur    Bulging disc 04/22/2012   Degenerative disc disease    Osteoarthritis    Panic anxiety syndrome    Stroke [facility]) 10/2020   Taking multiple medications for chronic disease     Past Surgical History:  Procedure Laterality Date   CYSTECTOMY     head   HERNIA REPAIR Left 2006,2014   [facility]   TOE SURGERY Right 2007    There were no vitals filed for this visit.   Subjective Assessment - 06/25/21 0938     Subjective  Pt. reports his arm is feeling better today, and that he is making progress with it.    Patient is accompanied by: Family member    Pertinent History Pt. is a 51 y.o. who was diagnosed with a CVA on July 21st, 2022. Pt. completed several weeks of  inpatient rehab at [facility]. After returning to home, Pt. sustained a fall in december of 2022, and was admitted to the hospital with COVI-19, and back pain from the fall. Since the most recent discharge, Pt. has been residing with the ex-wife until the Pt. is ready to return to independent living. Pt. PMHx includes: Bilateral LBP with right sided sciatica, Lumbosacral spondylosis myelopathy, closed compression Fx of L1. Pt. enjoys  cooking, and riding motorcycles.    Currently in Pain? No/denies            OT TREATMENT    Neuro muscular re-education:  Pt. worked on formulating 2pt., and 3pt. grasp patterns focusing on bringing the right thumb into position in preparation for grasping 1" cubes. Pt. worked on weightbearing, and proprioceptive input through the RUE in standing at the tabletop while reaching across midline with the LUE.   Therapeutic Exercise:  Pt. worked on [employer], and reciprocal motion using the UBE while seated for 8 min. with no resistance. Pt. required an ACE wrap in place for the right hand. Constant monitoring was provided. Pt. Worked on right  lateral pinch using yellow, and progressing to red resistive clips, and placing them onto a horizontal dowel.   Pt. was late for the session today due to transportation.  Pt. is improving with proper position of the right thumb when attempting to formulate 2pt., and 3pt. Grasp patterns. Pt. Continues to present with RUE weakness, impaired motor control, and [facility] skills. Pt. required support proximally at the right elbow while using the UBE, as well as stabilization at the elbow to maintain extension during weightbearing, and proprioceptive input in standing. Pt. Continues to work on improving RUE strength, and [facility] skills in  order to work towards improving, and maximizing independence with ADLs, and IADLs.                             OT Education - 06/25/21 0941     Education Details hand exercises, grasping patterns    Person(s) Educated Patient    Methods Explanation;Demonstration;Tactile cues;Verbal cues    Comprehension Verbalized understanding;Returned demonstration;Verbal cues required;Tactile cues required;Need further instruction              OT Short Term Goals - 06/22/21 1147       OT SHORT TERM GOAL #1   Title Pt. wil demonstrtae independence with HEPs    Baseline Eval: Pt. currently does not have one,  10th visit:  changes in HEP ongoing    Time 6    Period Weeks    Status On-going    Target Date 07/04/21               OT Long Term Goals - 06/22/21 1147       OT LONG TERM GOAL #1   Title Pt. will improve RUE strength by 2 mm grades to assist with ADLs, and IADLs,    Baseline Eval: Right shoulder flexion, abduction 3/5, elbow flexion, extension wrist extension 3+/5, 10th visit: improving with RUE strength by not yet met goal    Time 12    Period Weeks    Status On-going    Target Date 08/15/21      OT LONG TERM GOAL #2   Title Pt. will improve right grip by 10 lbs to prepare for firmly holding objects for IADLs.    Baseline Eval: R: 38#, L: 124# (In 3rd dynamometer slot)    Time 12    Period Weeks    Status On-going    Target Date 08/15/21      OT LONG TERM GOAL #3   Title Pt. will improve right lateral pinch strength by 5# to assist with cutting food    Baseline Eval: R: 17, L: 25    Time 12    Period Weeks    Status On-going    Target Date 08/15/21      OT LONG TERM GOAL #4   Title Pt. will improve Right 3pt pinch by 2# to be able to hold/open items for cooking    Baseline Eval: R: Pt. unable to engage thumb L: 29#    Time 12    Period Weeks    Status On-going    Target Date 08/15/21      OT LONG TERM GOAL #5   Title Pt. will right hand [facility] skills to be able to independently manipulate buttons, and zippers.    Baseline Eval: Pt. has difficulty managing buttons,a nd zippers. 10th visit:  improving    Time 12    Period Weeks    Status On-going    Target Date 08/15/21      OT LONG TERM GOAL #6   Title Pt. will independently write his name    Baseline Eval: Pt. is unable to hold a pen    Time 12    Period Weeks    Status On-going    Target Date 05/24/21      OT LONG TERM GOAL #7   Title Pt. will improve FOTO score by 2 points to reflect funational improvement    Baseline Eval: FOT score 46 with TR score 52    Time  12    Period Weeks    Status  On-going    Target Date 08/15/21                   Plan - 06/25/21 0943     Clinical Impression Statement Pt. was late for the session today due to transportation.  Pt. is improving with proper position of the right thumb when attempting to formulate 2pt., and 3pt. Grasp patterns. Pt. Continues to present with RUE weakness, impaired motor control, and [facility] skills. Pt. required support proximally at the right elbow while using the UBE, as well as stabilization at the elbow to maintain extension during weightbearing, and proprioceptive input in standing. Pt. Continues to work on improving RUE strength, and [facility] skills in order to work towards improving, and maximizing independence with ADLs, and IADLs.          OT Occupational Profile and History Detailed Assessment- Review of Records and additional review of physical, cognitive, psychosocial history related to current functional performance    Occupational performance deficits (Please refer to evaluation for details): ADL's;IADL's;Education    Rehab Potential Good    Clinical Decision Making Several treatment options, min-mod task modification necessary    Comorbidities Affecting Occupational Performance: May have comorbidities impacting occupational performance    Modification or Assistance to Complete Evaluation  Min-Moderate modification of tasks or assist with assess necessary to complete eval    OT Frequency 2x / week    OT Duration 12 weeks    OT Treatment/Interventions Self-care/ADL training;DME and/or AE instruction;Therapeutic exercise;Ultrasound;Neuromuscular education;Therapeutic activities;Energy conservation;Moist Heat;Patient/family education;Splinting;Functional Mobility Training;Paraffin    Consulted and Agree with Plan of Care Patient             Patient will benefit from skilled therapeutic intervention in order to improve the following deficits and impairments:           Visit Diagnosis: Muscle weakness  (generalized)  Other lack of coordination    Problem List Patient Active Problem List   Diagnosis Date Noted   Alterations of sensations following cerebrovascular accident 03/08/2021   Aphasia as late effect of cerebrovascular accident (CVA) 03/08/2021   Muscle spasm 02/22/2021   Acute non-recurrent frontal sinusitis 02/22/2021   Chronic pain syndrome 01/23/2021   Primary hypertension 01/23/2021   Nerve pain 01/23/2021   Erectile dysfunction due to diseases classified elsewhere 01/23/2021   Cognitive dysfunction 01/23/2021   Scrotal edema 01/23/2021   Elevated LDL cholesterol level 01/23/2021   History of stroke with residual deficit 01/23/2021   Primary insomnia 01/23/2021   Mouth pain 01/23/2021   Laceration of right hand without foreign body    Closed compression fracture of L1 vertebra (HCC) 05/24/2016   Lumbar stenosis with neurogenic claudication 11/01/2015   Chronic 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 11/08/2014   Hx of hemorrhoids 11/08/2014   AAA (abdominal aortic aneurysm) (HCC) 09/22/2014   Chronic pain associated with significant psychosocial dysfunction 09/22/2014   Panic disorder 09/22/2014   AB (asthmatic bronchitis) 08/17/2014   Anxiety 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 08/17/2014   Backache 08/17/2014   Lumbosacral spondylosis without myelopathy 08/17/2014   Disorder of male genital organs 08/17/2014   Brash 08/17/2014   Low back pain 08/17/2014   Tendon nodule 08/17/2014   Episodic paroxysmal anxiety disorder 08/17/2014   Hernia, inguinal, right 08/17/2014   Fast heart beat 08/17/2014   Illness 08/17/2014   Inguinal hernia 10/14/2012   Olegario Messier, MS, OTR/L   Olegario Messier, OT  06/25/2021, 9:44 AM  [facility] MAIN [facility] SERVICES [address] [place], [place], [address] Phone: [phone]   Fax:  [phone]  Name: David Reeves MRN: [id number] Date of Birth:  [birth year]

## 2021-06-26 ENCOUNTER — Other Ambulatory Visit: Payer: Self-pay | Admitting: Physician Assistant

## 2021-06-26 ENCOUNTER — Ambulatory Visit: Payer: Medicare HMO | Admitting: Physical Therapy

## 2021-06-26 DIAGNOSIS — F410 Panic disorder [episodic paroxysmal anxiety] without agoraphobia: Secondary | ICD-10-CM

## 2021-06-26 NOTE — Telephone Encounter (Signed)
Requested medication (s) are due for refill today: expired medication ? ?Requested medication (s) are on the active medication list: yes ? ?Last refill:  02/22/21-05/23/21 #90 0 refills ? ?Future visit scheduled: yes in 1 month ? ?Notes to clinic:  not delegated per protocol ? ? ?  ?Requested Prescriptions  ?Pending Prescriptions Disp Refills  ? zolpidem (AMBIEN) 10 MG tablet [Pharmacy Med Name: ZOLPIDEM TARTRATE 10 MG Tablet] 90 tablet   ?  Sig: TAKE 1 TABLET AT BEDTIME  ?  ? Not Delegated - Psychiatry:  Anxiolytics/Hypnotics Failed - 06/24/2021  9:58 PM  ?  ?  Failed - This refill cannot be delegated  ?  ?  Failed - Urine Drug Screen completed in last 360 days  ?  ?  Passed - Valid encounter within last 6 months  ?  Recent Outpatient Visits   ? ?      ? 1 month ago Alterations of sensations following cerebrovascular accident  ? [facility] Ok Edwards, Lillia Abed, PA-C  ? 3 months ago Alterations of sensations following cerebrovascular accident  ? [facility] Ok Edwards, Lillia Abed, PA-C  ? 4 months ago Muscle spasm  ? [facility] Jacky Kindle, FNP  ? 5 months ago Nerve pain  ? [facility] Merita Norton T, FNP  ? 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 [facility] Chrismon, Jodell Cipro, PA-C  ? ?  ?  ?Future Appointments   ? ?        ? In 1 month Drubel, Lou Cal [facility], PEC  ? ?  ? ?  ?  ?  ? ?

## 2021-06-27 ENCOUNTER — Telehealth: Payer: Self-pay | Admitting: Physician Assistant

## 2021-06-27 ENCOUNTER — Ambulatory Visit: Payer: Medicare HMO | Admitting: Occupational Therapy

## 2021-06-27 ENCOUNTER — Ambulatory Visit (INDEPENDENT_AMBULATORY_CARE_PROVIDER_SITE_OTHER): Payer: Medicare HMO | Admitting: *Deleted

## 2021-06-27 DIAGNOSIS — I10 Essential (primary) hypertension: Secondary | ICD-10-CM

## 2021-06-27 DIAGNOSIS — F09 Unspecified mental disorder due to known physiological condition: Secondary | ICD-10-CM

## 2021-06-27 DIAGNOSIS — I6930 Unspecified sequelae of cerebral infarction: Secondary | ICD-10-CM

## 2021-06-27 DIAGNOSIS — I69320 Aphasia following cerebral infarction: Secondary | ICD-10-CM

## 2021-06-27 NOTE — Telephone Encounter (Incomplete Revision)
Pt would like you to call him asap. ?Social services lost his medicare application, and he needs to do another one.  He said he has reached out to you several times with no answer. ?However he had the wrong number for you. I gave him the correct number. ?Successful transfer of call to Crystal ?

## 2021-06-27 NOTE — Chronic Care Management (AMB) (Signed)
Chronic Care Management    Clinical Social Work Note  06/27/2021 Name: David Reeves MRN: [id number] DOB: [birth year]  Vernon Maish is a 51 y.o. year old male who is a primary care patient of Alfredia Ferguson, [place]. The CCM team was consulted to assist the patient with chronic disease management and/or care coordination needs related to: [employer] .   Engaged with patient by telephone for follow up visit in response to provider referral for social work chronic care management and care coordination services.   Consent to Services:  The patient was given information about Chronic Care Management services, agreed to services, and gave verbal consent prior to initiation of services.  Please see initial visit note for detailed documentation.   Patient agreed to services and consent obtained.   Assessment: Review of patient past medical history, allergies, medications, and health status, including review of relevant consultants reports was performed today as part of a comprehensive evaluation and provision of chronic care management and care coordination services.     SDOH (Social Determinants of Health) assessments and interventions performed:    Advanced Directives Status: Not addressed in this encounter.  CCM Care Plan  Allergies  Allergen Reactions   Duloxetine     Other reaction(s): Other (See Comments) Increased temperature, sweating, uncontrolled shaking, aggressive thoughts   Amitriptyline Other (See Comments)    Urine retention   Buspirone Other (See Comments)    Kidney pain   Ciprofloxacin Other (See Comments)    hallucinations   Doxepin Other (See Comments)    "Didn't feel right"   Escitalopram Other (See Comments)    Agitation   Olanzapine    Penicillins Other (See Comments)    Unknown -- childhood reaction   Sertraline Other (See Comments)    Urine retention   Trazodone Other (See Comments)    abd pain, urine retention    Outpatient  Encounter Medications as of 06/27/2021  Medication Sig Note   acetaminophen (TYLENOL) 500 MG tablet Take 1,000 mg by mouth every 6 (six) hours as needed for mild pain.    amLODipine (NORVASC) 5 MG tablet Take 1 tablet (5 mg total) by mouth daily.    aspirin EC 81 MG tablet Take 81 mg by mouth daily. Swallow whole.    atorvastatin (LIPITOR) 80 MG tablet Take 1 tablet (80 mg total) by mouth daily.    Cholecalciferol (VITAMIN D3) 2000 units TABS Take by mouth.    diazepam (VALIUM) 5 MG tablet TAKE 1 TABLET (5 MG TOTAL) BY MOUTH EVERY 12 HOURS AS NEEDED FOR MUSCLE SPASM    donepezil (ARICEPT) 10 MG tablet Take 1 tablet (10 mg total) by mouth at bedtime.    meloxicam (MOBIC) 15 MG tablet Take 1 tablet (15 mg total) by mouth daily.    Multiple Vitamin (MULTIVITAMIN) tablet Take 1 tablet by mouth daily.    prazosin (MINIPRESS) 1 MG capsule TAKE 2 CAPSULES (2 MG TOTAL) BY MOUTH AT BEDTIME. DO NOT TAKE WITH AMBIEN.    tiZANidine (ZANAFLEX) 4 MG tablet Take 1 tablet (4 mg total) by mouth in the morning, at noon, in the evening, and at bedtime. 04/05/2021: New medication, due to start 06/05/2021   zolpidem (AMBIEN) 10 MG tablet Take 1 tablet (10 mg total) by mouth at bedtime. 05/16/2021: Patient reports medication is not effective/Requesting to change   No facility-administered encounter medications on file as of 06/27/2021.    Patient Active Problem List   Diagnosis Date Noted   Alterations of  sensations following cerebrovascular accident 03/08/2021   Aphasia as late effect of cerebrovascular accident (CVA) 03/08/2021   Muscle spasm 02/22/2021   Acute non-recurrent frontal sinusitis 02/22/2021   Chronic pain syndrome 01/23/2021   Primary hypertension 01/23/2021   Nerve pain 01/23/2021   Erectile dysfunction due to diseases classified elsewhere 01/23/2021   Cognitive dysfunction 01/23/2021   Scrotal edema 01/23/2021   Elevated LDL cholesterol level 01/23/2021   History of stroke with residual deficit  01/23/2021   Primary insomnia 01/23/2021   Mouth pain 01/23/2021   Laceration of right hand without foreign body    Closed compression fracture of L1 vertebra (HCC) 05/24/2016   Lumbar stenosis with neurogenic claudication 11/01/2015   Chronic 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 11/08/2014   Hx of hemorrhoids 11/08/2014   AAA (abdominal aortic aneurysm) (HCC) 09/22/2014   Chronic pain associated with significant psychosocial dysfunction 09/22/2014   Panic disorder 09/22/2014   AB (asthmatic bronchitis) 08/17/2014   Anxiety 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 08/17/2014   Backache 08/17/2014   Lumbosacral spondylosis without myelopathy 08/17/2014   Disorder of male genital organs 08/17/2014   Brash 08/17/2014   Low back pain 08/17/2014   Tendon nodule 08/17/2014   Episodic paroxysmal anxiety disorder 08/17/2014   Hernia, inguinal, right 08/17/2014   Fast heart beat 08/17/2014   Illness 08/17/2014   Inguinal hernia 10/14/2012    Conditions to be addressed/monitored: Cognitive dysfunction; Limited social support and transportation  Care Plan : General Social Work (Adult)  Updates made by [employer], Rushie Chestnuthrystal M, LCSW since 06/27/2021 12:00 AM     Problem: CHL AMB "PATIENT-SPECIFIC PROBLEM"   Priority: Medium  Note:   CARE PLAN ENTRY (see longitudinal plan of care for additional care plan information)  Current Barriers:  Patient with  cognitive dysfunction  -history of stroke-in need of assistance with connection to community resources  Knowledge deficits and need for support, education and care coordination related to community resources support  Financial constraints related to current medical condition, Limited social support, Transportation, ADL IADL limitations, Cognitive Deficits, and Lacks knowledge of community resource: Surveyor, quantityTransportation resources  Clinical Goal(s)  Over the next 90 days, patient will follow up with the Department of Social Services regarding  the status of his Medicaid application as well as [employer] transportation to follow up on the coordination of transportation needs  Interventions provided by LCSW: This Child psychotherapistsocial worker contacted requesting assistance with coordination for transportation to medical appointments Continues to confirm improvement in coordinated services-per patient "everything is coming together now" Confirmed that patient has contacted [employer] and has enrolled for transportation to future medical appointments-patient continues to use this as a Building surveyortransportation resource as well as ex-wife and significant other Followed up on Medicaid application-patient contacted by the Department of Social Services-application remains pending-patient encouraged to contact the Department of Social Services to follow up on status of pending application-patient confirmed that he has updated his address and will call them to follow up on status of application Collaboration phone call to the [facility] office provided for financial assistance [phone]-it was determined that patient does not qualify for patient assistance as his medical bills have not reached $5,000.00. Patient will need to call to make a patient arrangement for current balance Referral for Psychiatry discussed, appointment with the pain clinic where a psychiatrist is a part of the treatment-last appointment scheduled for 05/17/21 [facility] with [facility] scheduled for 06/07/21 anticipating epidural shot in spine Patient confirmed residing now  with ex-wife due to health concerns and is now receiving outpatient HH services(PT, OT, Speech through [facility]) Patient verbalized having no additional community resource needs at this time, patient provided with this social worker's contact information in the event  that any community resource needs arise [phone] 06/27/21 Update: Phone call from patient stating that he has contacted the Department of  Social Services and they have no record of his Medicaid application that was previously completed and submitted. Patient advised to present to the Department of Social Services to get a clear understanding regarding the status of his application, it was suggested that he bring a representative with him. Patient agreeable stating that he would plan to bring his ex-wife for support.  Patient Self Care Activities & Deficits:  Patient is unable to independently navigate community resource options without care coordination support  Acknowledges deficits and is motivated to resolve concern  Patient is able to contact [employer] as discussed today Unable to independently transport self to medical appointments, has difficulty with the functioning of his right side due to stroke Self administers medications as prescribed  Please see past updates related to this goal by clicking on the "Past Updates" button in the selected goal         Follow Up Plan: Client will contact this social worker with any additional community resource needs       [place], LCSW Clinical Social Worker  [facility] Care Management [phone]

## 2021-06-27 NOTE — Telephone Encounter (Addendum)
Pt would like you to call him asap. ?Social services lost his medicare application, and he needs to do another one.  He said he has reached out to you several times with no answer. ?However he had the wrong number for you. I gave him the correct number. ?Successful transfer of call to Crystal ?

## 2021-06-27 NOTE — Patient Instructions (Signed)
Visit Information ? ?Thank you for taking time to visit with me today. Please don't hesitate to contact me if I can be of assistance to you before our next scheduled telephone appointment. ? ?Following are the goals we discussed today:  ? ?- call [phone] when I need some help ?- follow-up on any referrals for help I am given ?- think ahead to make sure my need does not become an emergency ?- have a back-up plan ?- make a list of family or friends that I can call  ?-continue to follow upon status of Medicaid application with the Department of Social Services ?-continue to utilize [employer] to schedule rides to medical appointments ? ?If you are experiencing a Mental Health or Behavioral Health Crisis or need someone to talk to, please call the Suicide and Crisis Lifeline: 988  ? ?Patient verbalizes understanding of instructions and care plan provided today and agrees to view in MyChart. Active MyChart status confirmed with patient.   ? ?No further follow up required: patient agreeable to follow up with the Department of Social Services for status of Medicaid application previously completed  and submitted ? ? ?Bashir Marchetti, LCSW ?Clinical Social Worker  ?[facility] Care Management ?[phone] ? ?

## 2021-06-28 ENCOUNTER — Ambulatory Visit: Payer: Medicare HMO | Admitting: Physical Therapy

## 2021-06-28 ENCOUNTER — Ambulatory Visit: Payer: Medicare HMO

## 2021-06-28 ENCOUNTER — Other Ambulatory Visit: Payer: Self-pay

## 2021-06-28 ENCOUNTER — Ambulatory Visit: Payer: Medicare HMO | Admitting: Occupational Therapy

## 2021-06-28 DIAGNOSIS — R482 Apraxia: Secondary | ICD-10-CM | POA: Diagnosis not present

## 2021-06-28 DIAGNOSIS — R262 Difficulty in walking, not elsewhere classified: Secondary | ICD-10-CM

## 2021-06-28 DIAGNOSIS — G8929 Other chronic pain: Secondary | ICD-10-CM

## 2021-06-28 DIAGNOSIS — R6889 Other general symptoms and signs: Secondary | ICD-10-CM | POA: Diagnosis not present

## 2021-06-28 DIAGNOSIS — R2689 Other abnormalities of gait and mobility: Secondary | ICD-10-CM | POA: Diagnosis not present

## 2021-06-28 DIAGNOSIS — R269 Unspecified abnormalities of gait and mobility: Secondary | ICD-10-CM | POA: Diagnosis not present

## 2021-06-28 DIAGNOSIS — M6281 Muscle weakness (generalized): Secondary | ICD-10-CM

## 2021-06-28 DIAGNOSIS — Z8673 Personal history of transient ischemic attack (TIA), and cerebral infarction without residual deficits: Secondary | ICD-10-CM | POA: Diagnosis not present

## 2021-06-28 DIAGNOSIS — R278 Other lack of coordination: Secondary | ICD-10-CM | POA: Diagnosis not present

## 2021-06-28 DIAGNOSIS — R4701 Aphasia: Secondary | ICD-10-CM | POA: Diagnosis not present

## 2021-06-28 DIAGNOSIS — R2681 Unsteadiness on feet: Secondary | ICD-10-CM | POA: Diagnosis not present

## 2021-06-28 NOTE — Therapy (Addendum)
[name] [facility] SERVICES [address] [place], [place], [address] Phone: [phone]   Fax:  [phone]  Occupational Therapy Treatment  Patient Details  Name: David Reeves MRN: [id number] Date of Birth: [birth year] Referring Provider (OT): David Reeves   Encounter Date: 06/28/2021   OT End of Session - 06/28/21 1355     Visit Number 12    Number of Visits 24    Date for OT Re-Evaluation 08/15/21    Authorization Type Progress report period starting 06/22/2021    OT Start Time 0845    OT Stop Time 0930    OT Time Calculation (min) 45 min    Activity Tolerance Patient tolerated treatment well    Behavior During Therapy [facility] for tasks assessed/performed             Past Medical History:  Diagnosis Date   Back pain 04/22/2012   Bone spur    Bulging disc 04/22/2012   Degenerative disc disease    Osteoarthritis    Panic anxiety syndrome    Stroke [facility] 10/2020   Taking multiple medications for chronic disease     Past Surgical History:  Procedure Laterality Date   CYSTECTOMY     head   HERNIA REPAIR Left 2006,2014   [facility]   TOE SURGERY Right 2007    There were no vitals filed for this visit.   Subjective Assessment - 06/28/21 1354     Subjective  Pt. reports that his back is painful.    Patient is accompanied by: Family member    Pertinent History Pt. is a 51 y.o. who was diagnosed with a CVA on July 21st, 2022. Pt. completed several weeks of  inpatient rehab at [facility]. After returning to home, Pt. sustained a fall in december of 2022, and was admitted to the hospital with COVI-19, and back pain from the fall. Since the most recent discharge, Pt. has been residing with the ex-wife until the Pt. is ready to return to independent living. Pt. PMHx includes: Bilateral LBP with right sided sciatica, Lumbosacral spondylosis myelopathy, closed compression Fx of L1. Pt. enjoys cooking, and riding motorcycles.    Currently  in Pain? Yes    Pain Score 4     Pain Location Back    Pain Orientation Posterior;Lower    Pain Descriptors / Indicators Aching;Tightness;Sore    Pain Type Chronic pain    Pain Onset 1 to 4 weeks ago    Pain Frequency Intermittent              OT TREATMENT     Neuro muscular re-education:   Pt. worked on formulating a grasp pattern for grasping [place] style discs. Pt. worked on bringing his thumb into position when grasping them discs. Pt. worked on controlled release of the discs when placing them down, and stacking one at a time to form a vertical tower. Pt. was able to stack 9 discs vertically.    Therapeutic Exercise:   Pt. worked on [employer], and reciprocal motion using the UBE while seated for 8 min. with minimal resistance. Constant monitoring was provide, with the use of an ACE wrap and support at the elbow/forearm. Pt. worked on gross gripping using the gross gripper set at 6.6# for multiple reps. Pt. required assist to stabilize the gripper in a vertical position.   Pt. is making progress overall, and is now able to consistently align the right thumb with the 2nd digit when grasping the  [place] discs. Pt. Was able to create a stack of 9 discs. Pt. required support proximally for the RUE when using the UBE. Pt. required reps of alternating weightbearing during the task. Pt. continues to work on improving RUE functioning in order to work towards improving right hand function, developing grasp patterns, and maximizing overall independence with ADLs, and IADL tasks.                         OT Education - 06/28/21 1355     Education Details hand exercises, grasping patterns    Person(s) Educated Patient    Methods Explanation;Demonstration;Tactile cues;Verbal cues    Comprehension Verbalized understanding;Returned demonstration;Verbal cues required;Tactile cues required;Need further instruction              OT Short Term Goals - 06/22/21  1147       OT SHORT TERM GOAL #1   Title Pt. wil demonstrtae independence with HEPs    Baseline Eval: Pt. currently does not have one, 10th visit:  changes in HEP ongoing    Time 6    Period Weeks    Status On-going    Target Date 07/04/21               OT Long Term Goals - 06/22/21 1147       OT LONG TERM GOAL #1   Title Pt. will improve RUE strength by 2 mm grades to assist with ADLs, and IADLs,    Baseline Eval: Right shoulder flexion, abduction 3/5, elbow flexion, extension wrist extension 3+/5, 10th visit: improving with RUE strength by not yet met goal    Time 12    Period Weeks    Status On-going    Target Date 08/15/21      OT LONG TERM GOAL #2   Title Pt. will improve right grip by 10 lbs to prepare for firmly holding objects for IADLs.    Baseline Eval: R: 38#, L: 124# (In 3rd dynamometer slot)    Time 12    Period Weeks    Status On-going    Target Date 08/15/21      OT LONG TERM GOAL #3   Title Pt. will improve right lateral pinch strength by 5# to assist with cutting food    Baseline Eval: R: 17, L: 25    Time 12    Period Weeks    Status On-going    Target Date 08/15/21      OT LONG TERM GOAL #4   Title Pt. will improve Right 3pt pinch by 2# to be able to hold/open items for cooking    Baseline Eval: R: Pt. unable to engage thumb L: 29#    Time 12    Period Weeks    Status On-going    Target Date 08/15/21      OT LONG TERM GOAL #5   Title Pt. will right hand [facility] skills to be able to independently manipulate buttons, and zippers.    Baseline Eval: Pt. has difficulty managing buttons,a nd zippers. 10th visit:  improving    Time 12    Period Weeks    Status On-going    Target Date 08/15/21      OT LONG TERM GOAL #6   Title Pt. will independently write his name    Baseline Eval: Pt. is unable to hold a pen    Time 12    Period Weeks    Status On-going  Target Date 05/24/21      OT LONG TERM GOAL #7   Title Pt. will improve FOTO score  by 2 points to reflect funational improvement    Baseline Eval: FOT score 46 with TR score 52    Time 12    Period Weeks    Status On-going    Target Date 08/15/21                   Plan - 06/28/21 1356     Clinical Impression Statement Pt. is making progress overall, and is now able to consistently align the right thumb with the 2nd digit when grasping the [place] discs. Pt. Was able to create a stack of 90 discs. Pt. required support proximally for the RUE when using the UBE. Pt. required reps of alternating weightbearing during the task. Pt. continues to work on improving RUE functioning in order to work towards improving right hand function, developing grasp patterns, and maximizing overall independence with ADLs, and IADL tasks.       OT Occupational Profile and History Detailed Assessment- Review of Records and additional review of physical, cognitive, psychosocial history related to current functional performance    Occupational performance deficits (Please refer to evaluation for details): ADL's;IADL's;Education    Rehab Potential Good    Clinical Decision Making Several treatment options, min-mod task modification necessary    Comorbidities Affecting Occupational Performance: May have comorbidities impacting occupational performance    Modification or Assistance to Complete Evaluation  Min-Moderate modification of tasks or assist with assess necessary to complete eval    OT Frequency 2x / week    OT Duration 12 weeks    OT Treatment/Interventions Self-care/ADL training;DME and/or AE instruction;Therapeutic exercise;Ultrasound;Neuromuscular education;Therapeutic activities;Energy conservation;Moist Heat;Patient/family education;Splinting;Functional Mobility Training;Paraffin    Consulted and Agree with Plan of Care Patient             Patient will benefit from skilled therapeutic intervention in order to improve the following deficits and impairments:            Visit Diagnosis: Muscle weakness (generalized)    Problem List Patient Active Problem List   Diagnosis Date Noted   Alterations of sensations following cerebrovascular accident 03/08/2021   Aphasia as late effect of cerebrovascular accident (CVA) 03/08/2021   Muscle spasm 02/22/2021   Acute non-recurrent frontal sinusitis 02/22/2021   Chronic pain syndrome 01/23/2021   Primary hypertension 01/23/2021   Nerve pain 01/23/2021   Erectile dysfunction due to diseases classified elsewhere 01/23/2021   Cognitive dysfunction 01/23/2021   Scrotal edema 01/23/2021   Elevated LDL cholesterol level 01/23/2021   History of stroke with residual deficit 01/23/2021   Primary insomnia 01/23/2021   Mouth pain 01/23/2021   Laceration of right hand without foreign body    Closed compression fracture of L1 vertebra ([place]) 05/24/2016   Lumbar stenosis with neurogenic claudication 11/01/2015   Chronic 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 11/08/2014   Hx of hemorrhoids 11/08/2014   AAA (abdominal aortic aneurysm) ([place]) 09/22/2014   Chronic pain associated with significant psychosocial dysfunction 09/22/2014   Panic disorder 09/22/2014   AB (asthmatic bronchitis) 08/17/2014   Anxiety 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 08/17/2014   Backache 08/17/2014   Lumbosacral spondylosis without myelopathy 08/17/2014   Disorder of male genital organs 08/17/2014   Brash 08/17/2014   Low back pain 08/17/2014   Tendon nodule 08/17/2014   Episodic paroxysmal anxiety disorder 08/17/2014   Hernia, inguinal, right 08/17/2014   Fast  heart beat 08/17/2014   Illness 08/17/2014   Inguinal hernia 10/14/2012   Harrel Carina, MS, OTR/L   Harrel Carina, OT 06/28/2021, 1:58 PM  [place] MAIN [facility] SERVICES [address] [place], [place], [address] Phone: [phone]   Fax:  [phone]  Name: David Reeves MRN: [id number] Date of Birth:  Oct 01, 1970

## 2021-06-28 NOTE — Therapy (Signed)
[place] [facility] [address] [place], [place], [address] Phone: [phone]   Fax:  [phone]  Physical Therapy Treatment  Patient Details  Name: David Reeves MRN: [id number] Date of Birth: 26-Jan-1971 Referring Provider (PT): Alfredia Fergusonrubel, Lindsay PA   Encounter Date: 06/28/2021   PT End of Session - 06/28/21 0828     Visit Number 7    Number of Visits 17    Date for PT Re-Evaluation 07/25/21    Authorization Type [employer] 05/30/21-07/25/21    PT Start Time 1022    PT Stop Time 1046    PT Time Calculation (min) 24 min    Equipment Utilized During Treatment Gait belt    Activity Tolerance Patient tolerated treatment well;Patient limited by pain    Behavior During Therapy [facility] for tasks assessed/performed             Past Medical History:  Diagnosis Date   Back pain 04/22/2012   Bone spur    Bulging disc 04/22/2012   Degenerative disc disease    Osteoarthritis    Panic anxiety syndrome    Stroke [facility] 10/2020   Taking multiple medications for chronic disease     Past Surgical History:  Procedure Laterality Date   CYSTECTOMY     head   HERNIA REPAIR Left 2006,2014   [facility]   TOE SURGERY Right 2007    There were no vitals filed for this visit.   Subjective Assessment - 06/28/21 0826     Subjective Patient reports his back is really stiff due this morning and reports late due to transportation issues.    Pertinent History Pt. is a 51 y.o. who was diagnosed with a CVA on July 21st, 2022. Pt. [facility] After returning to home. He was discharged in late August/early September and recieved home health PT. Pt. sustained a fall in december of 2022, and was admitted to the hospital with COVID-19, and Chronic back pain. He reports chronic back pain syndrome since 2012. Since the most recent discharge, Pt. has been residing with the ex-wife until the Pt. is ready to return to  independent living. He did recieve 2 weeks of home health after discharge in December 2022. He is now being referred to outpatient PT to address weakness from stroke and improve fine motor movement. He reports rarely getting numbness/tingling in RLE. PMHx includes: Bilateral LBP with right sided sciatica, Lumbosacral spondylosis myelopathy, closed compression Fx of L1. Has chronic insomnia. osteoarthritis, Panic anxiety syndrome (taking medication) Pt. enjoys cooking, and riding motorcycles. [facility] on 06/07/21;    Limitations Sitting;Walking    How long can you sit comfortably? 20 min;    How long can you stand comfortably? 5-10 min;    How long can you walk comfortably? able to walk long ways (>500 feet)    Diagnostic tests MRI Nov 2022- Stable chronic burst L1 fracture, progressive disc degeneration with disc bulging  eccentric to the left at L3-4 and L4-5. spinal stenosis at L5-S1;    Patient Stated Goals "I Need all the help  I can get. I want to move around better and reduce my back pain."    Currently in Pain? Yes    Pain Score 4     Pain Location Back    Pain Orientation Posterior;Lower    Pain Descriptors / Indicators Aching;Tightness;Sore    Pain Type  Chronic pain    Pain Onset More than a month ago    Pain Frequency Intermittent    Aggravating Factors  Prolonged sit/stand, bending over, stooping, Reaching    Pain Relieving Factors Heat, Meds, rest, repositioning, laying down    Effect of Pain on Daily Activities Decreased activity tolerance                  INTERVENTIONS: Patient was 22 min late for appointment- States he had transportation issues. Reporting increased stiffness in low back today.   Manual therapy: Patient lying in supine with moist heat to low back.    Hamstring stretch (BLE) x 30 sec x 4 each leg.  Knee to chest (BLE) x 30 sec x 4 each leg Lower trunk rotation- BLE x 30 sec x 4 each LE Manual  hip circles (CW/CCW) x 20 -25 reps each direction and each LE Pirifomis x 30 sec x 3 sets each LE                       PT Education - 06/28/21 0949     Education Details Stretching techniques for improved low back and LE flexibility    Person(s) Educated Patient    Methods Explanation;Demonstration;Tactile cues;Verbal cues    Comprehension Verbalized understanding;Returned demonstration;Verbal cues required;Tactile cues required;Need further instruction              PT Short Term Goals - 05/31/21 1018       PT SHORT TERM GOAL #1   Title Patient will be adherent to HEP at least 3x a week to improve functional strength and balance for better safety at home.    Time 4    Period Weeks    Status New    Target Date 06/28/21      PT SHORT TERM GOAL #2   Title Patient (< 60 years old) will complete five times sit to stand test in < 15 seconds indicating an increased LE strength and improved balance.    Time 4    Period Weeks    Status New    Target Date 06/28/21               PT Long Term Goals - 05/31/21 1019       PT LONG TERM GOAL #1   Title Patient will increase six minute walk test distance to >1000 for progression to community ambulator and improve gait ability    Time 8    Period Weeks    Status New    Target Date 07/25/21      PT LONG TERM GOAL #2   Title Patient will ascend/descend 8 stairs without rail assist independently without loss of balance to improve ability to get in/out of home.    Time 8    Period Weeks    Status New    Target Date 07/25/21      PT LONG TERM GOAL #3   Title Patient will increase BLE gross strength to 4+/5 as to improve functional strength for independent gait, increased standing tolerance and increased ADL ability.    Time 8    Period Weeks    Status New    Target Date 07/25/21      PT LONG TERM GOAL #4   Title Patient will improve FOTO score to >50% to indicate improved functional mobility with Reeves  pain with ADLs.    Time 8    Period Weeks    Status  New    Target Date 07/25/21      PT LONG TERM GOAL #5   Title Patient will report a worst pain of 4/10 in low back over last week to indicate improved tolerance with ADLs.    Time 8    Period Weeks    Status New    Target Date 07/26/21                   Plan - 06/28/21 0949     Clinical Impression Statement Patient presented with good motivation- Reporting feeling really stiff today. His treatment was limited due to arriving late. He did report feeling much better and more flexible after session stating the stretching was really making a difference in his pain. Patient would benefit from additional skilled PT Intervention to improve strength and mobility.    Personal Factors and Comorbidities Comorbidity 3+;Past/Current Experience;Time since onset of injury/illness/exacerbation    Comorbidities PMHx includes: Bilateral LBP with right sided sciatica, Lumbosacral spondylosis myelopathy, closed compression Fx of L1. Has chronic insomnia. osteoarthritis, Panic anxiety syndrome (taking medication)    Examination-Activity Limitations Bend;Carry;Locomotion Level;Sit;Squat;Stairs;Stand;Transfers    Examination-Participation Restrictions Cleaning;Community Activity;Driving;Laundry;Meal Prep;Occupation;Shop;Volunteer;Yard Work    Stability/Clinical Decision Making Stable/Uncomplicated    Rehab Potential Good    PT Frequency 2x / week    PT Duration 8 weeks    PT Treatment/Interventions Cryotherapy;Electrical Stimulation;Moist Heat;Traction;Gait training;Stair training;Functional mobility training;Therapeutic activities;Therapeutic exercise;Balance training;Neuromuscular re-education;Patient/family education;Manual techniques;Passive range of motion;Dry needling;Energy conservation    PT Next Visit Plan Progress core stabilization, Manual therapy for low back and LE ROM    Consulted and Agree with Plan of Care Patient              Patient will benefit from skilled therapeutic intervention in order to improve the following deficits and impairments:  Abnormal gait, Decreased endurance, Decreased mobility, Difficulty walking, Increased muscle spasms, Decreased range of motion, Decreased activity tolerance, Decreased strength, Postural dysfunction, Pain  Visit Diagnosis: Muscle weakness (generalized)  Chronic bilateral low back pain without sciatica  Difficulty in walking, not elsewhere classified     Problem List Patient Active Problem List   Diagnosis Date Noted   Alterations of sensations following cerebrovascular accident 03/08/2021   Aphasia as late effect of cerebrovascular accident (CVA) 03/08/2021   Muscle spasm 02/22/2021   Acute non-recurrent frontal sinusitis 02/22/2021   Chronic pain syndrome 01/23/2021   Primary hypertension 01/23/2021   Nerve pain 01/23/2021   Erectile dysfunction due to diseases classified elsewhere 01/23/2021   Cognitive dysfunction 01/23/2021   Scrotal edema 01/23/2021   Elevated LDL cholesterol level 01/23/2021   History of stroke with residual deficit 01/23/2021   Primary insomnia 01/23/2021   Mouth pain 01/23/2021   Laceration of right hand without foreign body    Closed compression fracture of L1 vertebra (H[facility]) 05/24/2016   Lumbar stenosis with neurogenic claudication 11/01/2015   Chronic 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 11/08/2014   Hx of hemorrhoids 11/08/2014   AAA (abdominal aortic aneurysm) ([facility]) 09/22/2014   Chronic pain associated with significant psychosocial dysfunction 09/22/2014   Panic disorder 09/22/2014   AB (asthmatic bronchitis) 08/17/2014   Anxiety 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 08/17/2014   Backache 08/17/2014   Lumbosacral spondylosis without myelopathy 08/17/2014   Disorder of male genital organs 08/17/2014   Brash 08/17/2014   Low back pain 08/17/2014   Tendon nodule 08/17/2014   Episodic paroxysmal anxiety  disorder 08/17/2014   Hernia, inguinal, right 08/17/2014   Fast  heart beat 08/17/2014   Illness 08/17/2014   Inguinal hernia 10/14/2012    Lenda Kelp, PT 06/28/2021, 9:54 AM  [place] [facility] MAIN [facility] SERVICES [address] [place], [place], [address] Phone: [phone]   Fax:  [phone]  Name: David Reeves MRN: [id number] Date of Birth: 05/08/1970

## 2021-06-30 ENCOUNTER — Encounter: Payer: Self-pay | Admitting: Physician Assistant

## 2021-06-30 ENCOUNTER — Other Ambulatory Visit: Payer: Self-pay | Admitting: Family Medicine

## 2021-06-30 ENCOUNTER — Other Ambulatory Visit: Payer: Self-pay | Admitting: Physician Assistant

## 2021-06-30 DIAGNOSIS — G894 Chronic pain syndrome: Secondary | ICD-10-CM

## 2021-06-30 DIAGNOSIS — M62838 Other muscle spasm: Secondary | ICD-10-CM

## 2021-07-02 ENCOUNTER — Encounter: Payer: Self-pay | Admitting: Occupational Therapy

## 2021-07-02 ENCOUNTER — Ambulatory Visit: Payer: Medicare HMO | Admitting: Internal Medicine

## 2021-07-02 ENCOUNTER — Other Ambulatory Visit: Payer: Self-pay

## 2021-07-02 ENCOUNTER — Ambulatory Visit: Payer: Medicare HMO | Admitting: Occupational Therapy

## 2021-07-02 ENCOUNTER — Ambulatory Visit: Payer: Medicare HMO

## 2021-07-02 DIAGNOSIS — R2681 Unsteadiness on feet: Secondary | ICD-10-CM | POA: Diagnosis not present

## 2021-07-02 DIAGNOSIS — M6281 Muscle weakness (generalized): Secondary | ICD-10-CM | POA: Diagnosis not present

## 2021-07-02 DIAGNOSIS — R2689 Other abnormalities of gait and mobility: Secondary | ICD-10-CM | POA: Diagnosis not present

## 2021-07-02 DIAGNOSIS — R278 Other lack of coordination: Secondary | ICD-10-CM

## 2021-07-02 DIAGNOSIS — R262 Difficulty in walking, not elsewhere classified: Secondary | ICD-10-CM

## 2021-07-02 DIAGNOSIS — R482 Apraxia: Secondary | ICD-10-CM | POA: Diagnosis not present

## 2021-07-02 DIAGNOSIS — R269 Unspecified abnormalities of gait and mobility: Secondary | ICD-10-CM | POA: Diagnosis not present

## 2021-07-02 DIAGNOSIS — R4701 Aphasia: Secondary | ICD-10-CM | POA: Diagnosis not present

## 2021-07-02 DIAGNOSIS — R6889 Other general symptoms and signs: Secondary | ICD-10-CM | POA: Diagnosis not present

## 2021-07-02 DIAGNOSIS — Z8673 Personal history of transient ischemic attack (TIA), and cerebral infarction without residual deficits: Secondary | ICD-10-CM | POA: Diagnosis not present

## 2021-07-02 NOTE — Therapy (Signed)
[place] MAIN [facility] SERVICES [address] [place], [place], [address] Phone: [phone]   Fax:  [phone]  Occupational Therapy Treatment  Patient Details  Name: David Reeves MRN: [id number] Date of Birth: 1971/03/27 Referring Provider (OT): [name]   Encounter Date: 07/02/2021   OT End of Session - 07/02/21 1029     Visit Number 13    Number of Visits 24    Date for OT Re-Evaluation 08/15/21    Authorization Type Progress report period starting 06/22/2021    OT Start Time 0832    OT Stop Time 0915    OT Time Calculation (min) 43 min    Activity Tolerance Patient tolerated treatment well    Behavior During Therapy [facility] for tasks assessed/performed             Past Medical History:  Diagnosis Date   Back pain 04/22/2012   Bone spur    Bulging disc 04/22/2012   Degenerative disc disease    Osteoarthritis    Panic anxiety syndrome    Stroke [facility]) 10/2020   Taking multiple medications for chronic disease     Past Surgical History:  Procedure Laterality Date   CYSTECTOMY     head   HERNIA REPAIR Left 2006,2014   [facility]   TOE SURGERY Right 2007    There were no vitals filed for this visit.   Subjective Assessment - 07/02/21 1029     Subjective  Pt. reports that his back is very painful today    Patient is accompanied by: Family member    Pertinent History Pt. is a 51 y.o. who was diagnosed with a CVA on July 21st, 2022. Pt. completed several weeks of  inpatient rehab at [facility]. After returning to home, Pt. sustained a fall in december of 2022, and was admitted to the hospital with COVI-19, and back pain from the fall. Since the most recent discharge, Pt. has been residing with the ex-wife until the Pt. is ready to return to independent living. Pt. PMHx includes: Bilateral LBP with right sided sciatica, Lumbosacral spondylosis myelopathy, closed compression Fx of L1. Pt. enjoys cooking, and riding motorcycles.     Patient Stated Goals Pt would like to be as independent as he was before, improve hand function.    Currently in Pain? Yes    Pain Score 6     Pain Location Back    Pain Orientation Right;Left;Lower    Pain Descriptors / Indicators Aching;Throbbing    Pain Onset More than a month ago            OT TREATMENT     Neuro muscular re-education:   Pt. worked on formulating a grasp pattern for grasping 1&1/2" pegs, and [place] style discs. Pt. worked on bringing his thumb into position when grasping the discs. Pt. worked on controlled release of the discs when placing them down, and stacking one at a time to form a vertical tower.    Therapeutic Exercise:   Pt. worked on [employer], and reciprocal motion using the UBE while seated for 10 min. with minimal resistance. Constant monitoring was provide, with the use of an ACE wrap and support at the elbow/forearm. Pt. worked on gross gripping using the gross gripper set at 6.6# for multiple reps. Pt. required less  assist today to stabilize the gripper in a vertical position.   Pt. continues to make progress overall, and was able to clear the pegboard with rest breaks. Pt.  required support proximally for the RUE when using the UBE. Pt. required reps of alternating weightbearing during the [facility] tasks. Pt. Was able to grasp 1&1/2" pegs, however had more difficulty grasping [place] style discs. Pt. continues to work on improving RUE functioning in order to work towards improving right hand function, developing grasp patterns, and maximizing overall independence with ADLs, and IADL tasks.                               OT Education - 07/02/21 1029     Education Details hand exercises, grasping patterns    Person(s) Educated Patient    Methods Explanation;Demonstration;Tactile cues;Verbal cues    Comprehension Verbalized understanding;Returned demonstration;Verbal cues required;Tactile cues required;Need further  instruction              OT Short Term Goals - 06/22/21 1147       OT SHORT TERM GOAL #1   Title Pt. wil demonstrtae independence with HEPs    Baseline Eval: Pt. currently does not have one, 10th visit:  changes in HEP ongoing    Time 6    Period Weeks    Status On-going    Target Date 07/04/21               OT Long Term Goals - 06/22/21 1147       OT LONG TERM GOAL #1   Title Pt. will improve RUE strength by 2 mm grades to assist with ADLs, and IADLs,    Baseline Eval: Right shoulder flexion, abduction 3/5, elbow flexion, extension wrist extension 3+/5, 10th visit: improving with RUE strength by not yet met goal    Time 12    Period Weeks    Status On-going    Target Date 08/15/21      OT LONG TERM GOAL #2   Title Pt. will improve right grip by 10 lbs to prepare for firmly holding objects for IADLs.    Baseline Eval: R: 38#, L: 124# (In 3rd dynamometer slot)    Time 12    Period Weeks    Status On-going    Target Date 08/15/21      OT LONG TERM GOAL #3   Title Pt. will improve right lateral pinch strength by 5# to assist with cutting food    Baseline Eval: R: 17, L: 25    Time 12    Period Weeks    Status On-going    Target Date 08/15/21      OT LONG TERM GOAL #4   Title Pt. will improve Right 3pt pinch by 2# to be able to hold/open items for cooking    Baseline Eval: R: Pt. unable to engage thumb L: 29#    Time 12    Period Weeks    Status On-going    Target Date 08/15/21      OT LONG TERM GOAL #5   Title Pt. will right hand [facility] skills to be able to independently manipulate buttons, and zippers.    Baseline Eval: Pt. has difficulty managing buttons,a nd zippers. 10th visit:  improving    Time 12    Period Weeks    Status On-going    Target Date 08/15/21      OT LONG TERM GOAL #6   Title Pt. will independently write his name    Baseline Eval: Pt. is unable to hold a pen    Time 12  Period Weeks    Status On-going    Target Date 05/24/21       OT LONG TERM GOAL #7   Title Pt. will improve FOTO score by 2 points to reflect funational improvement    Baseline Eval: FOT score 46 with TR score 52    Time 12    Period Weeks    Status On-going    Target Date 08/15/21                   Plan - 07/02/21 1030     Clinical Impression Statement Pt. continues to make progress overall, and was able to clear the pegboard with rest breaks. Pt. required support proximally for the RUE when using the UBE. Pt. required reps of alternating weightbearing during the [facility] tasks. Pt. Was able to grasp 1&1/2" pegs, however had more difficulty grasping [place] style discs. Pt. continues to work on improving RUE functioning in order to work towards improving right hand function, developing grasp patterns, and maximizing overall independence with ADLs, and IADL tasks.            OT Occupational Profile and History Detailed Assessment- Review of Records and additional review of physical, cognitive, psychosocial history related to current functional performance    Occupational performance deficits (Please refer to evaluation for details): ADL's;IADL's;Education    Rehab Potential Good    Clinical Decision Making Several treatment options, min-mod task modification necessary    Comorbidities Affecting Occupational Performance: May have comorbidities impacting occupational performance    Modification or Assistance to Complete Evaluation  Min-Moderate modification of tasks or assist with assess necessary to complete eval    OT Frequency 2x / week    OT Duration 12 weeks    OT Treatment/Interventions Self-care/ADL training;DME and/or AE instruction;Therapeutic exercise;Ultrasound;Neuromuscular education;Therapeutic activities;Energy conservation;Moist Heat;Patient/family education;Splinting;Functional Mobility Training;Paraffin    Consulted and Agree with Plan of Care Patient             Patient will benefit from skilled therapeutic  intervention in order to improve the following deficits and impairments:           Visit Diagnosis: Muscle weakness (generalized)  Other lack of coordination    Problem List Patient Active Problem List   Diagnosis Date Noted   Alterations of sensations following cerebrovascular accident 03/08/2021   Aphasia as late effect of cerebrovascular accident (CVA) 03/08/2021   Muscle spasm 02/22/2021   Acute non-recurrent frontal sinusitis 02/22/2021   Chronic pain syndrome 01/23/2021   Primary hypertension 01/23/2021   Nerve pain 01/23/2021   Erectile dysfunction due to diseases classified elsewhere 01/23/2021   Cognitive dysfunction 01/23/2021   Scrotal edema 01/23/2021   Elevated LDL cholesterol level 01/23/2021   History of stroke with residual deficit 01/23/2021   Primary insomnia 01/23/2021   Mouth pain 01/23/2021   Laceration of right hand without foreign body    Closed compression fracture of L1 vertebra ([place]) 05/24/2016   Lumbar stenosis with neurogenic claudication 11/01/2015   Chronic 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 11/08/2014   Hx of hemorrhoids 11/08/2014   AAA (abdominal aortic aneurysm) ([place]) 09/22/2014   Chronic pain associated with significant psychosocial dysfunction 09/22/2014   Panic disorder 09/22/2014   AB (asthmatic bronchitis) 08/17/2014   Anxiety 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 08/17/2014   Backache 08/17/2014   Lumbosacral spondylosis without myelopathy 08/17/2014   Disorder of male genital organs 08/17/2014   Brash 08/17/2014   Low back pain 08/17/2014   Tendon  nodule 08/17/2014   Episodic paroxysmal anxiety disorder 08/17/2014   Hernia, inguinal, right 08/17/2014   Fast heart beat 08/17/2014   Illness 08/17/2014   Inguinal hernia 10/14/2012   Harrel Carina, MS, OTR/L   Harrel Carina, OT 07/02/2021, 10:32 AM  [place] MAIN [facility] SERVICES [address] [place],  [place], [address] Phone: [phone]   Fax:  [phone]  Name: Alvester Eads MRN: [id number] Date of Birth: 1970-09-17

## 2021-07-02 NOTE — Therapy (Signed)
[place] [facility] [address] [place], [place], [address] Phone: [phone]   Fax:  [phone]  Physical Therapy Treatment  Patient Details  Name: David Reeves MRN: [id number] Date of Birth: 02-28-1971 Referring Provider (PT): Alfredia Fergusonrubel, Lindsay PA   Encounter Date: 07/02/2021   PT End of Session - 07/02/21 0939     Visit Number 8    Number of Visits 17    Date for PT Re-Evaluation 07/25/21    Authorization Type [employer] 05/30/21-07/25/21    Progress Note Due on Visit 10    PT Start Time 0930    PT Stop Time 1012    PT Time Calculation (min) 42 min    Equipment Utilized During Treatment Gait belt    Activity Tolerance Patient tolerated treatment well;Patient limited by pain    Behavior [facility] tasks assessed/performed             Past Medical History:  Diagnosis Date   Back pain 04/22/2012   Bone spur    Bulging disc 04/22/2012   Degenerative disc disease    Osteoarthritis    Panic anxiety syndrome    Stroke [facility]) 10/2020   Taking multiple medications for chronic disease     Past Surgical History:  Procedure Laterality Date   CYSTECTOMY     head   HERNIA REPAIR Left 2006,2014   [facility]   TOE SURGERY Right 2007    There were no vitals filed for this visit.   Subjective Assessment - 07/02/21 0936     Subjective Patient reports he is having a bad day with his back- Rates at a 6/10- Reports he has those days due to chronic low back issues for years.    Pertinent History Pt. is a 51 y.o. who was diagnosed with a CVA on July 21st, 2022. Pt. [facility]. After returning to home. He was discharged in late August/early September and recieved home health PT. Pt. sustained a fall in december of 2022, and was admitted to the hospital with COVID-19, and Chronic back pain. He reports chronic back pain syndrome since 2012. Since the most recent discharge, Pt.  has been residing with the ex-wife until the Pt. is ready to return to independent living. He did recieve 2 weeks of home health after discharge in December 2022. He is now being referred to outpatient PT to address weakness from stroke and improve fine motor movement. He reports rarely getting numbness/tingling in RLE. PMHx includes: Bilateral LBP with right sided sciatica, Lumbosacral spondylosis myelopathy, closed compression Fx of L1. Has chronic insomnia. osteoarthritis, Panic anxiety syndrome (taking medication) Pt. enjoys cooking, and riding motorcycles. [facility] spinal injections on 06/07/21;    Limitations Sitting;Walking    How long can you sit comfortably? 20 min;    How long can you stand comfortably? 5-10 min;    How long can you walk comfortably? able to walk long ways (>500 feet)    Diagnostic tests MRI Nov 2022- Stable chronic burst L1 fracture, progressive disc degeneration with disc bulging  eccentric to the left at L3-4 and L4-5. spinal stenosis at L5-S1;    Patient Stated Goals "I Need all the help  I can get. I want to move around better and reduce my back pain."    Currently in Pain? Yes    Pain Score 6     Pain Location  Back    Pain Orientation Right;Left;Posterior;Lower    Pain Descriptors / Indicators Aching;Sore;Throbbing    Pain Type Chronic pain    Pain Radiating Towards Low back    Pain Onset More than a month ago    Pain Frequency Intermittent    Aggravating Factors  prolonged Sit/stand, bending over, stooping, Reaching    Pain Relieving Factors Heat Meds, rest, repositioning, laying down    Effect of Pain on Daily Activities Decreased activity tolerance              Manual therapy: Patient lying in supine with moist heat to low back.    Hamstring stretch (BLE) x 30 sec x 4 each leg.  Knee to chest (BLE) x 30 sec x 4 each leg Lower trunk rotation- BLE x 30 sec x 4 each LE Manual hip circles (CW/CCW) x 20 -25 reps  each direction and each LE Pirifomis x 30 sec x 3 sets each LE  Patient reported pain improved from 6/10 down to 3/10 after stretching.   Gait trainer:  4 min 0.42 m Left 0.47 m Right Time on each foot = 52% left; 48% right  0.42mph speed maintained- VC to increase heel strike as able  Therex:   Sit to stand x 10 reps  without UE support with VC for abdominal bracing.  Sit to stand x 10 reps staggered (5 reps with left LE out front then switched)    seated hamstrings with BTB 2 sets of 10 reps BLE (more difficulty with Right leg- VC to pull back for more ROM)  Seated Resistive ankle DF with BTB x 10 reps BLE.   Education provided throughout session via VC/TC and demonstration to facilitate movement at target joints and correct muscle activation for all testing and exercises performed.                   PT Education - 07/02/21 0939     Education Details Exercise technique    Person(s) Educated Patient    Methods Explanation;Demonstration;Tactile cues;Verbal cues    Comprehension Verbalized understanding;Returned demonstration;Verbal cues required;Tactile cues required;Need further instruction              PT Short Term Goals - 05/31/21 1018       PT SHORT TERM GOAL #1   Title Patient will be adherent to HEP at least 3x a week to improve functional strength and balance for better safety at home.    Time 4    Period Weeks    Status New    Target Date 06/28/21      PT SHORT TERM GOAL #2   Title Patient (< 63 years old) will complete five times sit to stand test in < 15 seconds indicating an increased LE strength and improved balance.    Time 4    Period Weeks    Status New    Target Date 06/28/21               PT Long Term Goals - 05/31/21 1019       PT LONG TERM GOAL #1   Title Patient will increase six minute walk test distance to >1000 for progression to community ambulator and improve gait ability    Time 8    Period Weeks    Status  New    Target Date 07/25/21      PT LONG TERM GOAL #2   Title Patient will ascend/descend 8 stairs without rail assist independently  without loss of balance to improve ability to get in/out of home.    Time 8    Period Weeks    Status New    Target Date 07/25/21      PT LONG TERM GOAL #3   Title Patient will increase BLE gross strength to 4+/5 as to improve functional strength for independent gait, increased standing tolerance and increased ADL ability.    Time 8    Period Weeks    Status New    Target Date 07/25/21      PT LONG TERM GOAL #4   Title Patient will improve FOTO score to >50% to indicate improved functional mobility with less pain with ADLs.    Time 8    Period Weeks    Status New    Target Date 07/25/21      PT LONG TERM GOAL #5   Title Patient will report a worst pain of 4/10 in low back over last week to indicate improved tolerance with ADLs.    Time 8    Period Weeks    Status New    Target Date 07/26/21                   Plan - 07/02/21 0940     Clinical Impression Statement Patient participated well today with VC's for gait sequencing. He continues to respond well to manual techniques for pain relief of low back. He presents with increased tightness right LE vs. Left and improved flexibility with increased stretching today. He also responded to VC to increase heel strike yet unable to perform consistently for 4 min and will benefit from further training/practice. Patient would benefit from additional skilled PT Intervention to improve strength and mobility.    Personal Factors and Comorbidities Comorbidity 3+;Past/Current Experience;Time since onset of injury/illness/exacerbation    Comorbidities PMHx includes: Bilateral LBP with right sided sciatica, Lumbosacral spondylosis myelopathy, closed compression Fx of L1. Has chronic insomnia. osteoarthritis, Panic anxiety syndrome (taking medication)    Examination-Activity Limitations Bend;Carry;Locomotion  Level;Sit;Squat;Stairs;Stand;Transfers    Examination-Participation Restrictions Cleaning;Community Activity;Driving;Laundry;Meal Prep;Occupation;Shop;Volunteer;Yard Work    Stability/Clinical Decision Making Stable/Uncomplicated    Rehab Potential Good    PT Frequency 2x / week    PT Duration 8 weeks    PT Treatment/Interventions Cryotherapy;Electrical Stimulation;Moist Heat;Traction;Gait training;Stair training;Functional mobility training;Therapeutic activities;Therapeutic exercise;Balance training;Neuromuscular re-education;Patient/family education;Manual techniques;Passive range of motion;Dry needling;Energy conservation    PT Next Visit Plan Progress core stabilization, Manual therapy for low back and LE ROM    Consulted and Agree with Plan of Care Patient             Patient will benefit from skilled therapeutic intervention in order to improve the following deficits and impairments:  Abnormal gait, Decreased endurance, Decreased mobility, Difficulty walking, Increased muscle spasms, Decreased range of motion, Decreased activity tolerance, Decreased strength, Postural dysfunction, Pain  Visit Diagnosis: Abnormality of gait and mobility  Difficulty in walking, not elsewhere classified  Muscle weakness (generalized)  Other abnormalities of gait and mobility  Unsteadiness on feet     Problem List Patient Active Problem List   Diagnosis Date Noted   Alterations of sensations following cerebrovascular accident 03/08/2021   Aphasia as late effect of cerebrovascular accident (CVA) 03/08/2021   Muscle spasm 02/22/2021   Acute non-recurrent frontal sinusitis 02/22/2021   Chronic pain syndrome 01/23/2021   Primary hypertension 01/23/2021   Nerve pain 01/23/2021   Erectile dysfunction due to diseases classified elsewhere 01/23/2021   Cognitive dysfunction 01/23/2021  Scrotal edema 01/23/2021   Elevated LDL cholesterol level 01/23/2021   History of stroke with residual  deficit 01/23/2021   Primary insomnia 01/23/2021   Mouth pain 01/23/2021   Laceration of right hand without foreign body    Closed compression fracture of L1 vertebra ([facility]) 05/24/2016   Lumbar stenosis with neurogenic claudication 11/01/2015   Chronic 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 11/08/2014   Hx of hemorrhoids 11/08/2014   AAA (abdominal aortic aneurysm) ([facility]) 09/22/2014   Chronic pain associated with significant psychosocial dysfunction 09/22/2014   Panic disorder 09/22/2014   AB (asthmatic bronchitis) 08/17/2014   Anxiety 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 08/17/2014   Backache 08/17/2014   Lumbosacral spondylosis without myelopathy 08/17/2014   Disorder of male genital organs 08/17/2014   Brash 08/17/2014   Low back pain 08/17/2014   Tendon nodule 08/17/2014   Episodic paroxysmal anxiety disorder 08/17/2014   Hernia, inguinal, right 08/17/2014   Fast heart beat 08/17/2014   Illness 08/17/2014   Inguinal hernia 10/14/2012    Lenda Kelp, PT 07/02/2021, 3:03 PM  [facility] MAIN [facility] SERVICES [address] [place], [place], [address] Phone: [phone]   Fax:  [phone]  Name: David Reeves MRN: [id number] Date of Birth: [birth year]

## 2021-07-02 NOTE — Telephone Encounter (Signed)
Requested medication (s) are due for refill today: yes ? ?Requested medication (s) are on the active medication list: no ? ?Last refill:  02/22/21 ? ?Future visit scheduled: yes ? ?Notes to clinic:  rx was dc'd on 03/08/21. Please assess ? ?Requested Prescriptions  ?Pending Prescriptions Disp Refills  ? baclofen (LIORESAL) 20 MG tablet [Pharmacy Med Name: BACLOFEN 20 MG TABLET] 270 tablet 1  ?  Sig: TAKE 1 TABLET BY MOUTH THREE TIMES A DAY  ?  ? Analgesics:  Muscle Relaxants - baclofen Passed - 06/30/2021  1:25 PM  ?  ?  Passed - Cr in normal range and within 180 days  ?  Creatinine  ?Date Value Ref Range Status  ?11/14/2013 0.85 0.60 - 1.30 mg/dL Final  ? ?Creatinine, Ser  ?Date Value Ref Range Status  ?04/05/2021 0.68 0.61 - 1.24 mg/dL Final  ?  ?  ?  ?  Passed - eGFR is 30 or above and within 180 days  ?  EGFR (African American)  ?Date Value Ref Range Status  ?11/14/2013 >60  Final  ? ?GFR calc Af Wyvonnia Lora  ?Date Value Ref Range Status  ?02/15/2020 121 >59 mL/min/1.73 Final  ?  Comment:  ?  **In accordance with recommendations from the NKF-ASN Task force,** ?  [facility] is in the process of updating its eGFR calculation to the ?  2021 CKD-EPI creatinine equation that estimates kidney function ?  without a race variable. ?  ? ?EGFR (Non-African Amer.)  ?Date Value Ref Range Status  ?11/14/2013 >60  Final  ?  Comment:  ?  eGFR values <62m/min/1.73 m2 may be an indication of chronic ?kidney disease (CKD). ?Calculated eGFR is useful in patients with stable renal function. ?The eGFR calculation will not be reliable in acutely ill patients ?when serum creatinine is changing rapidly. It is not useful in  ?patients on dialysis. The eGFR calculation may not be applicable ?to patients at the low and high extremes of body sizes, pregnant ?women, and vegetarians. ?  ? ?GFR, Estimated  ?Date Value Ref Range Status  ?04/05/2021 >60 >60 mL/min Final  ?  Comment:  ?  (NOTE) ?Calculated using the CKD-EPI Creatinine Equation (2021) ?   ? ?eGFR  ?Date Value Ref Range Status  ?12/14/2020 108 >59 mL/min/1.73 Final  ?  ?  ?  ?  Passed - Valid encounter within last 6 months  ?  Recent Outpatient Visits   ? ?      ? 2 months ago Alterations of sensations following cerebrovascular accident  ? [facility] LR[place] Comment PA-C  ? 3 months ago Alterations of sensations following cerebrovascular accident  ? [place] PA-C  ? 4 months ago Muscle spasm  ? [facility] Sprout FNP  ? 5 months ago Nerve pain  ? [facility] JoeT, FNP  ? 6 months ago Cerebrovascular accident (CVA) due to occlusion of left middle cerebral artery ([facility]  ? B[place] PA-C  ? ?  ?  ?Future Appointments   ? ?        ? In 1 month Drubel, LDelman [facility]  ? ?  ? ?  ?  ?  ? ? ? ? ?  ? ?

## 2021-07-03 ENCOUNTER — Ambulatory Visit: Payer: Medicare HMO | Admitting: Physical Therapy

## 2021-07-04 ENCOUNTER — Ambulatory Visit: Payer: Medicare HMO

## 2021-07-04 ENCOUNTER — Other Ambulatory Visit: Payer: Self-pay | Admitting: Physician Assistant

## 2021-07-04 ENCOUNTER — Ambulatory Visit: Payer: Medicare HMO | Admitting: Internal Medicine

## 2021-07-04 ENCOUNTER — Ambulatory Visit: Payer: Medicare HMO | Admitting: Occupational Therapy

## 2021-07-04 ENCOUNTER — Ambulatory Visit: Payer: Medicare HMO | Admitting: Speech Pathology

## 2021-07-04 ENCOUNTER — Encounter: Payer: Self-pay | Admitting: Occupational Therapy

## 2021-07-04 ENCOUNTER — Other Ambulatory Visit: Payer: Self-pay

## 2021-07-04 DIAGNOSIS — R2689 Other abnormalities of gait and mobility: Secondary | ICD-10-CM | POA: Diagnosis not present

## 2021-07-04 DIAGNOSIS — R482 Apraxia: Secondary | ICD-10-CM | POA: Diagnosis not present

## 2021-07-04 DIAGNOSIS — S32010S Wedge compression fracture of first lumbar vertebra, sequela: Secondary | ICD-10-CM

## 2021-07-04 DIAGNOSIS — R269 Unspecified abnormalities of gait and mobility: Secondary | ICD-10-CM | POA: Diagnosis not present

## 2021-07-04 DIAGNOSIS — R4701 Aphasia: Secondary | ICD-10-CM | POA: Diagnosis not present

## 2021-07-04 DIAGNOSIS — M6281 Muscle weakness (generalized): Secondary | ICD-10-CM | POA: Diagnosis not present

## 2021-07-04 DIAGNOSIS — G8929 Other chronic pain: Secondary | ICD-10-CM

## 2021-07-04 DIAGNOSIS — R2681 Unsteadiness on feet: Secondary | ICD-10-CM | POA: Diagnosis not present

## 2021-07-04 DIAGNOSIS — R278 Other lack of coordination: Secondary | ICD-10-CM | POA: Diagnosis not present

## 2021-07-04 DIAGNOSIS — R262 Difficulty in walking, not elsewhere classified: Secondary | ICD-10-CM | POA: Diagnosis not present

## 2021-07-04 DIAGNOSIS — Z8673 Personal history of transient ischemic attack (TIA), and cerebral infarction without residual deficits: Secondary | ICD-10-CM | POA: Diagnosis not present

## 2021-07-04 DIAGNOSIS — M47817 Spondylosis without myelopathy or radiculopathy, lumbosacral region: Secondary | ICD-10-CM

## 2021-07-04 DIAGNOSIS — R6889 Other general symptoms and signs: Secondary | ICD-10-CM | POA: Diagnosis not present

## 2021-07-04 NOTE — Telephone Encounter (Signed)
Requested medication (s) are due for refill today:due 07/05/21 ? ?Requested medication (s) are on the active medication list: yes   ? ?Last refill: 06/07/21  #60  0 refills ? ?Future visit scheduled No ? ?Notes to clinic:not delegated ? ?Requested Prescriptions  ?Pending Prescriptions Disp Refills  ? diazepam (VALIUM) 5 MG tablet 60 tablet 0  ?  Sig: TAKE 1 TABLET (5 MG TOTAL) BY MOUTH EVERY 12 HOURS AS NEEDED FOR MUSCLE SPASM  ?  ? Not Delegated - Psychiatry: Anxiolytics/Hypnotics 2 Failed - 07/04/2021  4:09 PM  ?  ?  Failed - This refill cannot be delegated  ?  ?  Failed - Urine Drug Screen completed in last 360 days  ?  ?  Passed - Patient is not pregnant  ?  ?  Passed - Valid encounter within last 6 months  ?  Recent Outpatient Visits   ? ?      ? 2 months ago Alterations of sensations following cerebrovascular accident  ? [facility] Edwards, Lillia Abed, PA-C  ? 3 months ago Alterations of sensations following cerebrovascular accident  ? [facility] Ok Edwards, Lillia Abed, PA-C  ? 4 months ago Muscle spasm  ? [facility] Jacky Kindle, FNP  ? 5 months ago Nerve pain  ? [facility] Merita Norton T, FNP  ? 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 [facility] Chrismon, Jodell Cipro, PA-C  ? ?  ?  ?Future Appointments   ? ?        ? In 1 month Drubel, Lou Cal [facility], [facility]  ? ?  ? ?  ?  ?  ? ? ? ? ?

## 2021-07-04 NOTE — Therapy (Signed)
[facility] ?[place], [place], [address] ?Phone: [phone]   Fax:  [phone] ? ?Occupational Therapy Treatment ? ?Patient Details  ?Name: David Reeves ?MRN: [id number] ?Date of Birth: 1971-01-25 ?Referring Provider (OT): [name] ? ? ?Encounter Date: 07/04/2021 ? ? OT End of Session - 07/04/21 1805   ? ? Visit Number 14   ? Number of Visits 24   ? Date for OT Re-Evaluation 08/15/21   ? Authorization Type Progress report period starting 06/22/2021   ? OT Start Time 8003   ? OT Stop Time 1730   ? OT Time Calculation (min) 45 min   ? Activity Tolerance Patient tolerated treatment well   ? Behavior During Therapy [facility] for tasks assessed/performed   ? ?  ?  ? ?  ? ? ?Past Medical History:  ?Diagnosis Date  ? Back pain 04/22/2012  ? Bone spur   ? Bulging disc 04/22/2012  ? Degenerative disc disease   ? Osteoarthritis   ? Panic anxiety syndrome   ? Stroke [facility]) 10/2020  ? Taking multiple medications for chronic disease   ? ? ?Past Surgical History:  ?Procedure Laterality Date  ? CYSTECTOMY    ? head  ? HERNIA REPAIR Left 2006,2014  ? [facility]  ? TOE SURGERY Right 2007  ? ? ?There were no vitals filed for this visit. ? ? Subjective Assessment - 07/04/21 1804   ? ? Subjective  Pt. reports that the afternoon appointments are not good for him.   ? Patient is accompanied by: Family member   ? Pertinent History Pt. is a 51 y.o. who was diagnosed with a CVA on July 21st, 2022. Pt. completed several weeks of  inpatient rehab at [facility]. After returning to home, Pt. sustained a fall in december of 2022, and was admitted to the hospital with COVI-19, and back pain from the fall. Since the most recent discharge, Pt. has been residing with the ex-wife until the Pt. is ready to return to independent living. Pt. PMHx includes: Bilateral LBP with right sided sciatica, Lumbosacral spondylosis myelopathy, closed compression Fx of L1. Pt. enjoys cooking, and  riding motorcycles.   ? Currently in Pain? Yes   ? Pain Score 4    ? Pain Location Back   ? Pain Orientation Right;Left;Posterior;Lower   ? Pain Descriptors / Indicators Aching;Sore;Tightness   ? Pain Onset More than a month ago   ? ?  ?  ? ?  ? ?OT TREATMENT   ? ?Neuro muscular re-education: ? ?Pt. worked on using his right hand to grasp the 1" cubes, and 1" circular pegs. Emphasis was placed on extending the thumb IP joint when moving the thumb into position to connect with the object with the 2nd digit to form a 2 pt. pinch grasp pattern.  ? ?Therapeutic Exercise: ? ?Pt. Worked on BUE strengthening, and reciprocal motion using the UBE while seated for 8 min. With minimal resistance. Support, and constant monitoring was provided along  with an ACE wrap to maintain his hand in position, and alignment. ? ?Pt. Reports having had an MD appointment yesterday. Pt. reports they are ordering him an EStim/Tens unit for his back. Pt. is making progress overall with his right UE function. Pt. Is trying to engage his right hand during more ADL, and IADL tasks at home. Pt. continues to work on formulating 2pt. pinch/grasp patterns when grasping 1" objects. Although improving, pt. continues to have difficulty consistently  extending the right thumb IP, and bringing it into position to connect with objects when grasping them. Pt. Continues to work on improving UE strength, and [facility] skills in order to work towards improving engagement of the right hand during ADLs, and IADL tasks, and maximizing overall independence. ? ? ? ? ? ? ? ? ? ? ? ? ? ? ? ? ? ? ? ? ? OT Education - 07/04/21 1805   ? ? Education Details OT treatment   ? Person(s) Educated Patient   ? Methods Explanation;Demonstration;Tactile cues;Verbal cues   ? Comprehension Verbalized understanding;Returned demonstration;Verbal cues required;Tactile cues required;Need further instruction   ? ?  ?  ? ?  ? ? ? OT Short Term Goals - 06/22/21 1147   ? ?  ? OT SHORT TERM GOAL  #1  ? Title Pt. wil demonstrtae independence with HEPs   ? Baseline Eval: Pt. currently does not have one, 10th visit:  changes in HEP ongoing   ? Time 6   ? Period Weeks   ? Status On-going   ? Target Date 07/04/21   ? ?  ?  ? ?  ? ? ? ? OT Long Term Goals - 06/22/21 1147   ? ?  ? OT LONG TERM GOAL #1  ? Title Pt. will improve RUE strength by 2 mm grades to assist with ADLs, and IADLs,   ? Baseline Eval: Right shoulder flexion, abduction 3/5, elbow flexion, extension wrist extension 3+/5, 10th visit: improving with RUE strength by not yet met goal   ? Time 12   ? Period Weeks   ? Status On-going   ? Target Date 08/15/21   ?  ? OT LONG TERM GOAL #2  ? Title Pt. will improve right grip by 10 lbs to prepare for firmly holding objects for IADLs.   ? Baseline Eval: R: 38#, L: 124# (In 3rd dynamometer slot)   ? Time 12   ? Period Weeks   ? Status On-going   ? Target Date 08/15/21   ?  ? OT LONG TERM GOAL #3  ? Title Pt. will improve right lateral pinch strength by 5# to assist with cutting food   ? Baseline Eval: R: 17, L: 25   ? Time 12   ? Period Weeks   ? Status On-going   ? Target Date 08/15/21   ?  ? OT LONG TERM GOAL #4  ? Title Pt. will improve Right 3pt pinch by 2# to be able to hold/open items for cooking   ? Baseline Eval: R: Pt. unable to engage thumb L: 29#   ? Time 12   ? Period Weeks   ? Status On-going   ? Target Date 08/15/21   ?  ? OT LONG TERM GOAL #5  ? Title Pt. will right hand [facility] skills to be able to independently manipulate buttons, and zippers.   ? Baseline Eval: Pt. has difficulty managing buttons,a nd zippers. 10th visit:  improving   ? Time 12   ? Period Weeks   ? Status On-going   ? Target Date 08/15/21   ?  ? OT LONG TERM GOAL #6  ? Title Pt. will independently write his name   ? Baseline Eval: Pt. is unable to hold a pen   ? Time 12   ? Period Weeks   ? Status On-going   ? Target Date 05/24/21   ?  ? OT LONG TERM GOAL #7  ? Title Pt.  will improve FOTO score by 2 points to reflect funational  improvement   ? Baseline Eval: FOT score 46 with TR score 52   ? Time 12   ? Period Weeks   ? Status On-going   ? Target Date 08/15/21   ? ?  ?  ? ?  ? ? ? ? ? ? ? ? Plan - 07/04/21 1806   ? ? Clinical Impression Statement Pt. Reports having had an MD appointment yesterday. Pt. reports they are ordering him an EStim/Tens unit for his back. Pt. is making progress overall with his right UE function. Pt. Is trying to engage his right hand during more ADL, and IADL tasks at home. Pt. continues to work on formulating 2pt. pinch/grasp patterns when grasping 1" objects. Although improving, pt. continues to have difficulty consistently extending the right thumb IP, and bringing it into position to connect with objects when grasping them. Pt. Continues to work on improving UE strength, and [facility] skills in order to work towards improving engagement of the right hand during ADLs, and IADL tasks, and maximizing overall independence. ?  ? OT Occupational Profile and History Detailed Assessment- Review of Records and additional review of physical, cognitive, psychosocial history related to current functional performance   ? Occupational performance deficits (Please refer to evaluation for details): ADL's;IADL's;Education   ? Rehab Potential Good   ? Clinical Decision Making Several treatment options, min-mod task modification necessary   ? Comorbidities Affecting Occupational Performance: May have comorbidities impacting occupational performance   ? Modification or Assistance to Complete Evaluation  Min-Moderate modification of tasks or assist with assess necessary to complete eval   ? OT Frequency 2x / week   ? OT Duration 12 weeks   ? OT Treatment/Interventions Self-care/ADL training;DME and/or AE instruction;Therapeutic exercise;Ultrasound;Neuromuscular education;Therapeutic activities;Energy conservation;Moist Heat;Patient/family education;Splinting;Functional Mobility Training;Paraffin   ? Consulted and Agree with Plan of Care  Patient   ? ?  ?  ? ?  ? ? ?Patient will benefit from skilled therapeutic intervention in order to improve the following deficits and impairments:   ?  ?  ?  ? ? ?Visit Diagnosis: ?Muscle weakness (genera

## 2021-07-04 NOTE — Therapy (Signed)
[facility] ?[address] ?[place], [place], [address] ?Phone: [phone]   Fax:  [phone] ? ?Physical Therapy Treatment ? ?Patient Details  ?Name: David Reeves ?MRN: [id number] ?Date of Birth: [birth year] ?Referring Provider (PT): Alfredia Fergusonrubel, Lindsay PA ? ? ?Encounter Date: 07/04/2021 ? ? PT End of Session - 07/04/21 1605   ? ? Visit Number 9   ? Number of Visits 17   ? Date for PT Re-Evaluation 07/25/21   ? Authorization Type [employer] 05/30/21-07/25/21   ? Progress Note Due on Visit 10   ? PT Start Time 1558   ? PT Stop Time 1640   ? PT Time Calculation (min) 42 min   ? Equipment Utilized During Treatment Gait belt   ? Activity Tolerance Patient tolerated treatment well;Patient limited by pain   ? Behavior During Therapy [facility] for tasks assessed/performed   ? ?  ?  ? ?  ? ? ?Past Medical History:  ?Diagnosis Date  ? Back pain 04/22/2012  ? Bone spur   ? Bulging disc 04/22/2012  ? Degenerative disc disease   ? Osteoarthritis   ? Panic anxiety syndrome   ? Stroke [facility](HCC) 10/2020  ? Taking multiple medications for chronic disease   ? ? ?Past Surgical History:  ?Procedure Laterality Date  ? CYSTECTOMY    ? head  ? HERNIA REPAIR Left 2006,2014  ? [facility]  ? TOE SURGERY Right 2007  ? ? ?There were no vitals filed for this visit. ? ? Subjective Assessment - 07/04/21 1604   ? ? Subjective Patient reports feeling some better than last visit. Reports LBP= 4/10   ? Pertinent History Pt. is a 51 y.o. who was diagnosed with a CVA on July 21st, 2022. Pt. completed several weeks of  [facility]. After returning to home. He was discharged in late August/early September and recieved home health PT. Pt. sustained a fall in december of 2022, and was admitted to the hospital with COVID-19, and Chronic back pain. He reports chronic back pain syndrome since 2012. Since the most recent discharge, Pt. has been residing with the ex-wife until the Pt. is ready to return  to independent living. He did recieve 2 weeks of home health after discharge in December 2022. He is now being referred to outpatient PT to address weakness from stroke and improve fine motor movement. He reports rarely getting numbness/tingling in RLE. PMHx includes: Bilateral LBP with right sided sciatica, Lumbosacral spondylosis myelopathy, closed compression Fx of L1. Has chronic insomnia. osteoarthritis, Panic anxiety syndrome (taking medication) Pt. enjoys cooking, and riding motorcycles. [facility] on 06/07/21;   ? Limitations Sitting;Walking   ? How long can you sit comfortably? 20 min;   ? How long can you stand comfortably? 5-10 min;   ? How long can you walk comfortably? able to walk long ways (>500 feet)   ? Diagnostic tests MRI Nov 2022- Stable chronic burst L1 fracture, progressive disc degeneration with disc bulging  eccentric to the left at L3-4 and L4-5. spinal stenosis at L5-S1;   ? Patient Stated Goals "I Need all the help  I can get. I want to move around better and reduce my back pain."   ? Currently in Pain? Yes   ? Pain Score 4    ? Pain Location Back   ? Pain Orientation Right;Left;Posterior;Lower   ? Pain Descriptors / Indicators Aching;Sore;Tightness   ?  Pain Type Chronic pain   ? Pain Onset More than a month ago   ? Pain Frequency Intermittent   ? ?  ?  ? ?  ? ? ?Manual therapy: Patient lying in supine with moist heat to low back.  ?  ?Hamstring stretch (BLE) x 30 sec x 4 each leg.  ?Knee to chest (BLE) x 30 sec x 4 each leg ?Lower trunk rotation- BLE x 30 sec x 4 each LE ?Manual hip circles (CW/CCW) x 20 -25 reps each direction and each LE ?Pirifomis x 30 sec x 3 sets each LE ?  ?Patient reported pain improved from 4/10 down to 2/10 after stretching.  ?  ?Therex:  ? ?step up with high march in // bars x 20 reps each leg ? ?Bosu- lunge - 20 reps each leg- Patient reported fatigue yet no pain or issue using BUE support.  ? ?Side step  up/over hurdle, 1/2 foam, balance beam in // bars - down length of bars and back x 8 times. Mild difficulty picking up Right LE initially- improved with practice.  ? ?Education provided throughout session via VC/TC and demonstration to facilitate movement at target joints and correct muscle activation for all testing and exercises performed.  ? ? ? ? ? ? ? ? ? ? ? ? ? ? ? ? ? ? ? ? ? ? ? PT Education - 07/04/21 1605   ? ? Education Details Exercise technique   ? Person(s) Educated Patient   ? Methods Explanation;Demonstration;Tactile cues;Verbal cues   ? Comprehension Verbalized understanding;Need further instruction;Verbal cues required;Returned demonstration;Tactile cues required   ? ?  ?  ? ?  ? ? ? PT Short Term Goals - 05/31/21 1018   ? ?  ? PT SHORT TERM GOAL #1  ? Title Patient will be adherent to HEP at least 3x a week to improve functional strength and balance for better safety at home.   ? Time 4   ? Period Weeks   ? Status New   ? Target Date 06/28/21   ?  ? PT SHORT TERM GOAL #2  ? Title Patient (< 16 years old) will complete five times sit to stand test in < 15 seconds indicating an increased LE strength and improved balance.   ? Time 4   ? Period Weeks   ? Status New   ? Target Date 06/28/21   ? ?  ?  ? ?  ? ? ? ? PT Long Term Goals - 05/31/21 1019   ? ?  ? PT LONG TERM GOAL #1  ? Title Patient will increase six minute walk test distance to >1000 for progression to community ambulator and improve gait ability   ? Time 8   ? Period Weeks   ? Status New   ? Target Date 07/25/21   ?  ? PT LONG TERM GOAL #2  ? Title Patient will ascend/descend 8 stairs without rail assist independently without loss of balance to improve ability to get in/out of home.   ? Time 8   ? Period Weeks   ? Status New   ? Target Date 07/25/21   ?  ? PT LONG TERM GOAL #3  ? Title Patient will increase BLE gross strength to 4+/5 as to improve functional strength for independent gait, increased standing tolerance and increased ADL  ability.   ? Time 8   ? Period Weeks   ? Status New   ? Target Date 07/25/21   ?  ?  PT LONG TERM GOAL #4  ? Title Patient will improve FOTO score to >50% to indicate improved functional mobility with less pain with ADLs.   ? Time 8   ? Period Weeks   ? Status New   ? Target Date 07/25/21   ?  ? PT LONG TERM GOAL #5  ? Title Patient will report a worst pain of 4/10 in low back over last week to indicate improved tolerance with ADLs.   ? Time 8   ? Period Weeks   ? Status New   ? Target Date 07/26/21   ? ?  ?  ? ?  ? ? ? ? ? ? ? ? Plan - 07/04/21 1541   ? ? Clinical Impression Statement Patient presented with relieved back pain after manual therapy today. He was able to progress to some Standing dynamic LE strengthening without report of increased pain. He was able to return demonstration well without difficulty today. Patient would benefit from additional skilled PT Intervention to improve strength and mobility.   ? Personal Factors and Comorbidities Comorbidity 3+;Past/Current Experience;Time since onset of injury/illness/exacerbation   ? Comorbidities PMHx includes: Bilateral LBP with right sided sciatica, Lumbosacral spondylosis myelopathy, closed compression Fx of L1. Has chronic insomnia. osteoarthritis, Panic anxiety syndrome (taking medication)   ? Examination-Activity Limitations Bend;Carry;Locomotion Level;Sit;Squat;Stairs;Stand;Transfers   ? Examination-Participation Restrictions Cleaning;Community Activity;Driving;Laundry;Meal Prep;Occupation;Shop;Volunteer;Pincus Badder Work   ? Stability/Clinical Decision Making Stable/Uncomplicated   ? Rehab Potential Good   ? PT Frequency 2x / week   ? PT Duration 8 weeks   ? PT Treatment/Interventions Cryotherapy;Electrical Stimulation;Moist Heat;Traction;Gait training;Stair training;Functional mobility training;Therapeutic activities;Therapeutic exercise;Balance training;Neuromuscular re-education;Patient/family education;Manual techniques;Passive range of motion;Dry  needling;Energy conservation   ? PT Next Visit Plan Progress core stabilization, Manual therapy for low back and LE ROM   ? Consulted and Agree with Plan of Care Patient   ? ?  ?  ? ?  ? ? ?Patient will benefit fr

## 2021-07-04 NOTE — Telephone Encounter (Signed)
Medication Refill - Medication: diazepam (VALIUM) 5 MG tablet  ° °Has the patient contacted their pharmacy? Yes.   °(Agent: If no, request that the patient contact the pharmacy for the refill. If patient does not wish to contact the pharmacy document the reason why and proceed with request.) °(Agent: If yes, when and what did the pharmacy advise?) ° °Preferred Pharmacy (with phone number or street name):  °[id number] - [place], [place] - [address]  °[address] [place]  [address]  °Phone: [phone] Fax: [phone]  ° °Has the patient been seen for an appointment in the last year OR does the patient have an upcoming appointment? Yes.   ° °Agent: Please be advised that RX refills may take up to 3 business days. We ask that you follow-up with your pharmacy. ° °

## 2021-07-05 ENCOUNTER — Encounter: Payer: Self-pay | Admitting: Physician Assistant

## 2021-07-05 ENCOUNTER — Ambulatory Visit: Payer: Medicare HMO | Admitting: Physical Therapy

## 2021-07-05 MED ORDER — DIAZEPAM 5 MG PO TABS: ORAL_TABLET | ORAL | 0 refills | Status: DC

## 2021-07-05 NOTE — Therapy (Signed)
[place] ?[facility] ?[address] ?[place], [place], [address] ?Phone: [phone]   Fax:  [phone] ? ?Speech Language Pathology Treatment ? ?Patient Details  ?Name: David Reeves ?MRN: [id number] ?Date of Birth: [birth year] ?Referring Provider (SLP): Alfredia Ferguson ? ? ?Encounter Date: 07/04/2021 ? ? End of Session - 07/05/21 1909   ? ? Visit Number 4   ? Number of Visits 25   ? Date for SLP Re-Evaluation 08/27/21   ? Authorization Type [employer] HMO   ? Authorization Time Period 06/04/2021 thru 08/27/2021   ? Authorization - Visit Number 4   ? Progress Note Due on Visit 10   ? SLP Start Time 1500   ? SLP Stop Time  1600   ? SLP Time Calculation (min) 60 min   ? Activity Tolerance Patient tolerated treatment well   ? ?  ?  ? ?  ? ? ?Past Medical History:  ?Diagnosis Date  ? Back pain 04/22/2012  ? Bone spur   ? Bulging disc 04/22/2012  ? Degenerative disc disease   ? Osteoarthritis   ? Panic anxiety syndrome   ? Stroke [facility]) 10/2020  ? Taking multiple medications for chronic disease   ? ? ?Past Surgical History:  ?Procedure Laterality Date  ? CYSTECTOMY    ? head  ? HERNIA REPAIR Left 2006,2014  ? [facility]  ? TOE SURGERY Right 2007  ? ? ?There were no vitals filed for this visit. ? ? Subjective Assessment - 07/05/21 1900   ? ? Subjective pt pleasant, appeared jovel   ? Currently in Pain? No/denies   ? ?  ?  ? ?  ? ? ? ? ? ? ? ? ADULT SLP TREATMENT - 07/05/21 0001   ? ?  ? Treatment Provided  ? Treatment provided Cognitive-Linquistic   ?  ? Cognitive-Linquistic Treatment  ? Skilled Treatment Skilled treatment session focused on pt's speech intelligibility. Pt continues to struggle with insight into current diagnosis and need to utilize speech intelligibility strategies. SLP created a pacing paper and putting in pt's cellphone cover. SLP also facilitated reminder for pt take his phone out of his pocket when sitting down for OT. When pacing, pt's speech  intelligibility improves from 40% to 75% at the sentence level.   ? ?  ?  ? ?  ? ? ? SLP Education - 07/05/21 1908   ? ? Education Details need to use speech intelligibility strategies   ? Person(s) Educated Patient   ? Methods Explanation;Demonstration;Tactile cues;Verbal cues;Handout   ? Comprehension Verbalized understanding;Returned demonstration;Need further instruction;Verbal cues required;Tactile cues required   ? ?  ?  ? ?  ? ? ? SLP Short Term Goals - 06/04/21 1536   ? ?  ? SLP SHORT TERM GOAL #1  ? Title Pt will use the speech intelligibility strategy of pacing to slow rate of speech at the simple sentence level in 8 out of 10 opportunities.   ? Baseline < 25% speech intelligiblity d/t fast rate of speech   ? Time 10   ? Period --   sessions  ? Status New   ?  ? SLP SHORT TERM GOAL #2  ? Title Pt will produce fricatives in the initial, final and medial positions of words with 90% accuracy.   ? Baseline pt stops all fricatives   ? Time 10   ? Period --   sessions  ? Status New   ?  ? SLP  SHORT TERM GOAL #3  ? Title Pt will use speech intelligibility strategy of overarticulation to improve speech intelligibility to 75% at the simple sentence level.   ? Baseline < 25% speech intelligibili   ? Time 10   ? Period --   sessions  ? Status New   ?  ? SLP SHORT TERM GOAL #4  ? Title Pt will answer WH questions after reading paragraph level passage with 75% accuracy.   ? Baseline new goal   ? Time 10   ? Period --   sessions  ? Status New   ? ?  ?  ? ?  ? ? ? SLP Long Term Goals - 06/04/21 1540   ? ?  ? SLP LONG TERM GOAL #1  ? Title Pt will use speech intelligibility stategies to achieve 50% speech intelligibility at the sentence level.   ? Baseline < 25% speech intelligibility   ? Time 12   ? Period Weeks   ? Status New   ? Target Date 08/27/21   ? ?  ?  ? ?  ? ? ? Plan - 07/05/21 1909   ? ? Clinical Impression Statement Pt continues to lack insight into severity and overall apraxia of speech therefore he  continues to be depenedent on cues to use pacing to establish correct motor movements.Continued skilled ST services continued to be required to improve functional communication for increase participation in family and community activities.   ? Speech Therapy Frequency 2x / week   ? Duration 12 weeks   ? Treatment/Interventions Language facilitation;Compensatory techniques;Cueing hierarchy;Internal/external aids;SLP instruction and feedback;Patient/family education;Compensatory strategies   ? Potential to Achieve Goals Good   ? Potential Considerations Other (comment);Severity of impairments   time post onset  ? SLP Home Exercise Plan provided, see pt instructions section   ? Consulted and Agree with Plan of Care Patient   ? ?  ?  ? ?  ? ? ?Patient will benefit from skilled therapeutic intervention in order to improve the following deficits and impairments:   ?Apraxia ? ?H/O ischemic left MCA stroke ? ?H/O ischemic left ACA stroke ? ? ? ?Problem List ?Patient Active Problem List  ? Diagnosis Date Noted  ? Alterations of sensations following cerebrovascular accident 03/08/2021  ? Aphasia as late effect of cerebrovascular accident (CVA) 03/08/2021  ? Muscle spasm 02/22/2021  ? Acute non-recurrent frontal sinusitis 02/22/2021  ? Chronic pain syndrome 01/23/2021  ? Primary hypertension 01/23/2021  ? Nerve pain 01/23/2021  ? Erectile dysfunction due to diseases classified elsewhere 01/23/2021  ? Cognitive dysfunction 01/23/2021  ? Scrotal edema 01/23/2021  ? Elevated LDL cholesterol level 01/23/2021  ? History of stroke with residual deficit 01/23/2021  ? Primary insomnia 01/23/2021  ? Mouth pain 01/23/2021  ? Laceration of right hand without foreign body   ? Closed compression fracture of L1 vertebra (HCC) 05/24/2016  ? Lumbar stenosis with neurogenic claudication 11/01/2015  ? Chronic 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 11/08/2014  ? Hx of hemorrhoids 11/08/2014  ? AAA (abdominal aortic aneurysm) (HCC)  09/22/2014  ? Chronic pain associated with significant psychosocial dysfunction 09/22/2014  ? Panic disorder 09/22/2014  ? AB (asthmatic bronchitis) 08/17/2014  ? Anxiety 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 08/17/2014  ? Backache 08/17/2014  ? Lumbosacral spondylosis without myelopathy 08/17/2014  ? Disorder of male genital organs 08/17/2014  ? Brash 08/17/2014  ? Low back pain 08/17/2014  ? Tendon nodule 08/17/2014  ? Episodic paroxysmal anxiety disorder 08/17/2014  ?  Hernia, inguinal, right 08/17/2014  ? Fast heart beat 08/17/2014  ? Illness 08/17/2014  ? Inguinal hernia 10/14/2012  ? ?Mayanna Garlitz B. Dreama Saaverton, M.S., CCC-SLP, CBIS ?Speech-Language Pathologist ?Rehabilitation Services ?Office [phone] ? ?Eliannah Hinde, CCC-SLP ?07/05/2021, 7:11 PM ? ?[place] ?[facility] ?[address] ?[place], [place], [address] ?Phone: [phone]   Fax:  [phone] ? ? ?Name: Kerby Lessimothy Lee Railsback ?MRN: [id number] ?Date of Birth: Feb 18, 1971 ? ?

## 2021-07-05 NOTE — Patient Instructions (Signed)
Use pacing to improve his speech intelligibility ?

## 2021-07-09 ENCOUNTER — Ambulatory Visit: Payer: Medicare HMO | Admitting: Internal Medicine

## 2021-07-09 ENCOUNTER — Ambulatory Visit: Payer: Medicare HMO | Admitting: Occupational Therapy

## 2021-07-09 ENCOUNTER — Other Ambulatory Visit: Payer: Self-pay | Admitting: Physician Assistant

## 2021-07-09 DIAGNOSIS — G894 Chronic pain syndrome: Secondary | ICD-10-CM

## 2021-07-09 MED ORDER — MELOXICAM 15 MG PO TABS: 15.0000 mg | ORAL_TABLET | Freq: Every day | ORAL | 0 refills | Status: DC

## 2021-07-09 NOTE — Addendum Note (Signed)
Addended by: Benjiman Core on: 07/09/2021 11:58 AM ? ? Modules accepted: Orders ? ?

## 2021-07-09 NOTE — Telephone Encounter (Signed)
[facility] faxed refill request for the following medications: ? ?meloxicam (MOBIC) 15 MG tablet  ? ?Please advise. ? ?

## 2021-07-09 NOTE — Telephone Encounter (Signed)
Last refill: 02/09/2021 #90 with 3 refills ? ?Last office visit: 05/03/2021 ?Next office visit: 08/03/2021 ? ? ?

## 2021-07-10 ENCOUNTER — Other Ambulatory Visit: Payer: Self-pay

## 2021-07-10 ENCOUNTER — Ambulatory Visit: Payer: Medicare HMO | Admitting: Occupational Therapy

## 2021-07-10 ENCOUNTER — Encounter: Payer: Medicare HMO | Admitting: Occupational Therapy

## 2021-07-10 ENCOUNTER — Ambulatory Visit: Payer: Medicare HMO | Admitting: Speech Pathology

## 2021-07-10 DIAGNOSIS — Z8673 Personal history of transient ischemic attack (TIA), and cerebral infarction without residual deficits: Secondary | ICD-10-CM

## 2021-07-10 DIAGNOSIS — R278 Other lack of coordination: Secondary | ICD-10-CM | POA: Diagnosis not present

## 2021-07-10 DIAGNOSIS — R269 Unspecified abnormalities of gait and mobility: Secondary | ICD-10-CM | POA: Diagnosis not present

## 2021-07-10 DIAGNOSIS — M6281 Muscle weakness (generalized): Secondary | ICD-10-CM | POA: Diagnosis not present

## 2021-07-10 DIAGNOSIS — R2681 Unsteadiness on feet: Secondary | ICD-10-CM | POA: Diagnosis not present

## 2021-07-10 DIAGNOSIS — R4701 Aphasia: Secondary | ICD-10-CM | POA: Diagnosis not present

## 2021-07-10 DIAGNOSIS — R2689 Other abnormalities of gait and mobility: Secondary | ICD-10-CM | POA: Diagnosis not present

## 2021-07-10 DIAGNOSIS — R482 Apraxia: Secondary | ICD-10-CM

## 2021-07-10 DIAGNOSIS — R262 Difficulty in walking, not elsewhere classified: Secondary | ICD-10-CM | POA: Diagnosis not present

## 2021-07-10 NOTE — Therapy (Signed)
[place] ?[facility] ?[place], [place], [address] ?Phone: [phone]   Fax:  [phone] ? ?Occupational Therapy Treatment ? ?Patient Details  ?Name: David Reeves ?MRN: [id number] ?Date of Birth: 08-05-1970 ?Referring Provider (OT): [name] ? ? ?Encounter Date: 07/10/2021 ? ? OT End of Session - 07/10/21 1041   ? ? Visit Number 15   ? Number of Visits 24   ? Date for OT Re-Evaluation 08/15/21   ? Authorization Type Progress report period starting 06/22/2021   ? OT Start Time 0845   ? OT Stop Time 0930   ? OT Time Calculation (min) 45 min   ? Activity Tolerance Patient tolerated treatment well   ? Behavior During Therapy [facility] for tasks assessed/performed   ? ?  ?  ? ?  ? ? ?Past Medical History:  ?Diagnosis Date  ? Back pain 04/22/2012  ? Bone spur   ? Bulging disc 04/22/2012  ? Degenerative disc disease   ? Osteoarthritis   ? Panic anxiety syndrome   ? Stroke [facility]) 10/2020  ? Taking multiple medications for chronic disease   ? ? ?Past Surgical History:  ?Procedure Laterality Date  ? CYSTECTOMY    ? head  ? HERNIA REPAIR Left 2006,2014  ? [facility]  ? TOE SURGERY Right 2007  ? ? ?There were no vitals filed for this visit. ? ? Subjective Assessment - 07/10/21 1040   ? ? Subjective  Pt. reports doing well today.  ? Patient is accompanied by: Family member   ? Pertinent History Pt. is a 51 y.o. who was diagnosed with a CVA on July 21st, 2022. Pt. completed several weeks of  inpatient rehab at [facility]. After returning to home, Pt. sustained a fall in december of 2022, and was admitted to the hospital with COVI-19, and back pain from the fall. Since the most recent discharge, Pt. has been residing with the ex-wife until the Pt. is ready to return to independent living. Pt. PMHx includes: Bilateral LBP with right sided sciatica, Lumbosacral spondylosis myelopathy, closed compression Fx of L1. Pt. enjoys cooking, and riding motorcycles.   ? Patient Stated  Goals Pt would like to be as independent as he was before, improve hand function.   ? Currently in Pain? ?Pain Location ?Pain score yes  ?Back ?4  ? ?  ?  ? ?  ? ?OT TREATMENT   ? ?Neuro muscular re-education: ? ?Pt. worked on [facility] skills grasping 4" pegs from the flat tabletop surface. Pt. then progressed to controlling [place] movements grasping pegs vertically from the tabletop, and moving them to place  them vertically at a higher surface.  ? ?Therapeutic Exercise: ? ?Pt. worked on [employer], and reciprocal motion using the UBE while seated for 8 min. with minimal resistance. Constant monitoring was provided. Pt. worked on formulating supination AROM movements with the right UE, followed by performing reps while gross grasping an [place] Board handle. Pt. required  support proximally at the elbow to encourage active isolated movement.   ? ?Pt. is progressing with the RUE, and hand. Pt. required reps of alternating weightbearing, and proprioceptive input during the [facility] tasks to inhibit the tone in the right hand. Pt. was more consistently able to bring his thumb into position to make contact with the objects when grasping then objects. As the task progressed, Pt. improved with the accuracy of placing the pegs down in a vertical position on the tabletop without tipping them  over. Pt. Continues to work on improving RUE strength, and [facility] skills in order to work towards improving, and maximizing independence with ADLs, and IADL tasks.  ? ? ? ? ? ? ? ? ? ? ? ? ? ? ? ? ? ? ? ? ? OT Education - 07/10/21 1041   ? ? Education Details OT treatment   ? Person(s) Educated Patient   ? Methods Explanation;Demonstration;Tactile cues;Verbal cues   ? Comprehension Verbalized understanding;Returned demonstration;Verbal cues required;Tactile cues required;Need further instruction   ? ?  ?  ? ?  ? ? ? OT Short Term Goals - 06/22/21 1147   ? ?  ? OT SHORT TERM GOAL #1  ? Title Pt. wil demonstrtae independence with HEPs   ? Baseline  Eval: Pt. currently does not have one, 10th visit:  changes in HEP ongoing   ? Time 6   ? Period Weeks   ? Status On-going   ? Target Date 07/04/21   ? ?  ?  ? ?  ? ? ? ? OT Long Term Goals - 06/22/21 1147   ? ?  ? OT LONG TERM GOAL #1  ? Title Pt. will improve RUE strength by 2 mm grades to assist with ADLs, and IADLs,   ? Baseline Eval: Right shoulder flexion, abduction 3/5, elbow flexion, extension wrist extension 3+/5, 10th visit: improving with RUE strength by not yet met goal   ? Time 12   ? Period Weeks   ? Status On-going   ? Target Date 08/15/21   ?  ? OT LONG TERM GOAL #2  ? Title Pt. will improve right grip by 10 lbs to prepare for firmly holding objects for IADLs.   ? Baseline Eval: R: 38#, L: 124# (In 3rd dynamometer slot)   ? Time 12   ? Period Weeks   ? Status On-going   ? Target Date 08/15/21   ?  ? OT LONG TERM GOAL #3  ? Title Pt. will improve right lateral pinch strength by 5# to assist with cutting food   ? Baseline Eval: R: 17, L: 25   ? Time 12   ? Period Weeks   ? Status On-going   ? Target Date 08/15/21   ?  ? OT LONG TERM GOAL #4  ? Title Pt. will improve Right 3pt pinch by 2# to be able to hold/open items for cooking   ? Baseline Eval: R: Pt. unable to engage thumb L: 29#   ? Time 12   ? Period Weeks   ? Status On-going   ? Target Date 08/15/21   ?  ? OT LONG TERM GOAL #5  ? Title Pt. will right hand [facility] skills to be able to independently manipulate buttons, and zippers.   ? Baseline Eval: Pt. has difficulty managing buttons,a nd zippers. 10th visit:  improving   ? Time 12   ? Period Weeks   ? Status On-going   ? Target Date 08/15/21   ?  ? OT LONG TERM GOAL #6  ? Title Pt. will independently write his name   ? Baseline Eval: Pt. is unable to hold a pen   ? Time 12   ? Period Weeks   ? Status On-going   ? Target Date 05/24/21   ?  ? OT LONG TERM GOAL #7  ? Title Pt. will improve FOTO score by 2 points to reflect funational improvement   ? Baseline Eval: FOT score 46 with TR  score 52   ?  Time 12   ? Period Weeks   ? Status On-going   ? Target Date 08/15/21   ? ?  ?  ? ?  ? ? ? ? ? ? ? ? Plan - 07/10/21 1043   ? ? Clinical Impression Statement Pt. is progressing with the RUE, and hand. Pt. required reps of alternating weightbearing, and proprioceptive input during the [facility] tasks to inhibit the tone in the right hand. Pt. was more consistently able to bring his thumb into position to make contact with the objects when grasping then objects. As the task progressed, Pt. improved with the accuracy of placing the pegs down in a vertical position on the tabletop without tipping them over. Pt. Continues to work on improving RUE strength, and [facility] skills in order to work towards improving, and maximizing independence with ADLs, and IADL tasks.  ? ? ? ? ?  ? OT Occupational Profile and History Detailed Assessment- Review of Records and additional review of physical, cognitive, psychosocial history related to current functional performance   ? Occupational performance deficits (Please refer to evaluation for details): ADL's;IADL's;Education   ? Rehab Potential Good   ? Clinical Decision Making Several treatment options, min-mod task modification necessary   ? Comorbidities Affecting Occupational Performance: May have comorbidities impacting occupational performance   ? Modification or Assistance to Complete Evaluation  Min-Moderate modification of tasks or assist with assess necessary to complete eval   ? OT Frequency 2x / week   ? OT Duration 12 weeks   ? OT Treatment/Interventions Self-care/ADL training;DME and/or AE instruction;Therapeutic exercise;Ultrasound;Neuromuscular education;Therapeutic activities;Energy conservation;Moist Heat;Patient/family education;Splinting;Functional Mobility Training;Paraffin   ? Consulted and Agree with Plan of Care Patient   ? ?  ?  ? ?  ? ? ?Patient will benefit from skilled therapeutic intervention in order to improve the following deficits and impairments:   ?  ?  ?   ? ? ?Visit Diagnosis: ?Muscle weakness (generalized) ? ?Other lack of coordination ? ? ? ?Problem List ?Patient Active Problem List  ? Diagnosis Date Noted  ? Alterations of sensations following cerebrovascular ac

## 2021-07-11 ENCOUNTER — Ambulatory Visit: Payer: Medicare HMO | Admitting: Occupational Therapy

## 2021-07-11 NOTE — Therapy (Signed)
[place] ?[facility] ?[address] ?[place], [place], [address] ?Phone: [phone]   Fax:  [phone] ? ?Speech Language Pathology Treatment ? ?Patient Details  ?Name: David Reeves ?MRN: [id number] ?Date of Birth: [birth year] ?Referring Provider (SLP): Alfredia FergusonLindsay Drubel ? ? ?Encounter Date: 07/10/2021 ? ? End of Session - 07/11/21 1515   ? ? Visit Number 5   ? Number of Visits 25   ? Date for SLP Re-Evaluation 08/27/21   ? Authorization Type [employer] Medicare HMO   ? Authorization Time Period 06/04/2021 thru 08/27/2021   ? Authorization - Visit Number 5   ? Progress Note Due on Visit 10   ? SLP Start Time [phone]   ? SLP Stop Time  0850   ? SLP Time Calculation (min) 37 min   ? Activity Tolerance Patient tolerated treatment well   ? ?  ?  ? ?  ? ? ?Past Medical History:  ?Diagnosis Date  ? Back pain 04/22/2012  ? Bone spur   ? Bulging disc 04/22/2012  ? Degenerative disc disease   ? Osteoarthritis   ? Panic anxiety syndrome   ? Stroke [facility](HCC) 10/2020  ? Taking multiple medications for chronic disease   ? ? ?Past Surgical History:  ?Procedure Laterality Date  ? CYSTECTOMY    ? head  ? HERNIA REPAIR Left 2006,2014  ? [facility]  ? TOE SURGERY Right 2007  ? ? ?There were no vitals filed for this visit. ? ? Subjective Assessment - 07/11/21 1514   ? ? Subjective pt pleasant, "8 is early" referring to 8am session   ? Currently in Pain? No/denies   ? ?  ?  ? ?  ? ? ? ? ? ? ? ? ADULT SLP TREATMENT - 07/11/21 0001   ? ?  ? Treatment Provided  ? Treatment provided Cognitive-Linquistic   ?  ? Cognitive-Linquistic Treatment  ? Treatment focused on Apraxia;Patient/family/caregiver education   ? Skilled Treatment Skilled treatment session focused on pt's speech intelligibility specifically SLP facilitated session by providing the following interventions:    Use of speech intelligibility strategies: SLP provided supervision level reminders to use pacing with dots on back of pt's phone; when  using pt's speech intelligibility improves from 50% to 90% at the sentence level    Apraxia of speech: pt required minimal cues for rate when reading list of /spr/ words contained on page 84 of [employer] [facility] [employer] [facility] to achieve 85% accuracy; cues increased to maximal multimodal when reading sentences (page 85) containing /spr/ to achieve d/t inability to control rate of speech, SLP covered sentence with post-it note and revealed one word at a time to achieve 90% articulatory accuracy.   ? ?  ?  ? ?  ? ? ? SLP Education - 07/11/21 1514   ? ? Education Details provided   ? Person(s) Educated Patient   ? Methods Explanation;Demonstration;Tactile cues;Verbal cues   ? Comprehension Verbalized understanding;Need further instruction;Returned demonstration   ? ?  ?  ? ?  ? ? ? SLP Short Term Goals - 06/04/21 1536   ? ?  ? SLP SHORT TERM GOAL #1  ? Title Pt will use the speech intelligibility strategy of pacing to slow rate of speech at the simple sentence level in 8 out of 10 opportunities.   ? Baseline < 25% speech intelligiblity d/t fast rate of speech   ? Time 10   ? Period --   sessions  ?  Status New   ?  ? SLP SHORT TERM GOAL #2  ? Title Pt will produce fricatives in the initial, final and medial positions of words with 90% accuracy.   ? Baseline pt stops all fricatives   ? Time 10   ? Period --   sessions  ? Status New   ?  ? SLP SHORT TERM GOAL #3  ? Title Pt will use speech intelligibility strategy of overarticulation to improve speech intelligibility to 75% at the simple sentence level.   ? Baseline < 25% speech intelligibili   ? Time 10   ? Period --   sessions  ? Status New   ?  ? SLP SHORT TERM GOAL #4  ? Title Pt will answer WH questions after reading paragraph level passage with 75% accuracy.   ? Baseline new goal   ? Time 10   ? Period --   sessions  ? Status New   ? ?  ?  ? ?  ? ? ? SLP Long Term Goals - 06/04/21 1540   ? ?  ? SLP LONG TERM GOAL #1  ? Title Pt will use speech intelligibility  stategies to achieve 50% speech intelligibility at the sentence level.   ? Baseline < 25% speech intelligibility   ? Time 12   ? Period Weeks   ? Status New   ? Target Date 08/27/21   ? ?  ?  ? ?  ? ? ? Plan - 07/11/21 1515   ? ? 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 As a result, skilled ST intervention is required to instruct pt in speech intelligibility strategies to increase functional independence and reduce caregiver burden.   ? Speech Therapy Frequency 2x / week   ? Duration 12 weeks   ? Treatment/Interventions Language facilitation;Compensatory techniques;Cueing hierarchy;Internal/external aids;SLP instruction and feedback;Patient/family education;Compensatory strategies   ? Potential to Achieve Goals Good   ? Potential Considerations Other (comment);Severity of impairments   time post onset  ? SLP Home Exercise Plan provided, see pt instructions section   ? Consulted and Agree with Plan of Care Patient   ? ?  ?  ? ?  ? ? ?Patient will benefit from skilled therapeutic intervention in order to improve the following deficits and impairments:   ?Apraxia ? ?H/O ischemic left MCA stroke ? ? ? ?Problem List ?Patient Active Problem List  ? Diagnosis Date Noted  ? Alterations of sensations following cerebrovascular accident 03/08/2021  ? Aphasia as late effect of cerebrovascular accident (CVA) 03/08/2021  ? Muscle spasm 02/22/2021  ? Acute non-recurrent frontal sinusitis 02/22/2021  ? Chronic pain syndrome 01/23/2021  ? Primary hypertension 01/23/2021  ? Nerve pain 01/23/2021  ? Erectile dysfunction due to diseases classified elsewhere 01/23/2021  ? Cognitive dysfunction 01/23/2021  ? Scrotal edema 01/23/2021  ? Elevated LDL cholesterol level 01/23/2021  ? History of stroke with residual deficit 01/23/2021  ? Primary insomnia 01/23/2021  ? Mouth pain 01/23/2021  ? Laceration of right hand without  foreign body   ? Closed compression fracture of L1 vertebra (HCC) 05/24/2016  ? Lumbar stenosis with neurogenic claudication 11/01/2015  ? Chronic 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 11/08/2014  ? Hx of hemorrhoids 11/08/2014  ? AAA (abdominal aortic aneurysm) (HCC) 09/22/2014  ? Chronic pain associated with significant psychosocial dysfunction 09/22/2014  ? Panic disorder 09/22/2014  ? AB (asthmatic  bronchitis) 08/17/2014  ? Anxiety 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 08/17/2014  ? Backache 08/17/2014  ? Lumbosacral spondylosis without myelopathy 08/17/2014  ? Disorder of male genital organs 08/17/2014  ? Brash 08/17/2014  ? Low back pain 08/17/2014  ? Tendon nodule 08/17/2014  ? Episodic paroxysmal anxiety disorder 08/17/2014  ? Hernia, inguinal, right 08/17/2014  ? Fast heart beat 08/17/2014  ? Illness 08/17/2014  ? Inguinal hernia 10/14/2012  ? ?Zuma Hust B. Dreama Saa, M.S., CCC-SLP, CBIS ?Speech-Language Pathologist ?Rehabilitation Services ?Office [phone] ? ?Shatia Sindoni Dreama Saa, CCC-SLP ?07/11/2021, 8:49 PM ? ?[place] ?[facility] ?[address] ?[place], [place], [address] ?Phone: [phone]   Fax:  [phone] ? ? ?Name: Jarid Sasso ?MRN: [id number] ?Date of Birth: 11-20-1970 ? ?

## 2021-07-11 NOTE — Patient Instructions (Signed)
Use pacing to slow rate of speech ?

## 2021-07-13 ENCOUNTER — Ambulatory Visit: Payer: Medicare HMO | Admitting: Speech Pathology

## 2021-07-13 ENCOUNTER — Ambulatory Visit: Payer: Medicare HMO

## 2021-07-13 ENCOUNTER — Other Ambulatory Visit: Payer: Self-pay

## 2021-07-13 DIAGNOSIS — R482 Apraxia: Secondary | ICD-10-CM

## 2021-07-13 DIAGNOSIS — M6281 Muscle weakness (generalized): Secondary | ICD-10-CM | POA: Diagnosis not present

## 2021-07-13 DIAGNOSIS — R2689 Other abnormalities of gait and mobility: Secondary | ICD-10-CM | POA: Diagnosis not present

## 2021-07-13 DIAGNOSIS — R6889 Other general symptoms and signs: Secondary | ICD-10-CM | POA: Diagnosis not present

## 2021-07-13 DIAGNOSIS — R4701 Aphasia: Secondary | ICD-10-CM

## 2021-07-13 DIAGNOSIS — R278 Other lack of coordination: Secondary | ICD-10-CM

## 2021-07-13 DIAGNOSIS — R262 Difficulty in walking, not elsewhere classified: Secondary | ICD-10-CM | POA: Diagnosis not present

## 2021-07-13 DIAGNOSIS — R2681 Unsteadiness on feet: Secondary | ICD-10-CM | POA: Diagnosis not present

## 2021-07-13 DIAGNOSIS — Z8673 Personal history of transient ischemic attack (TIA), and cerebral infarction without residual deficits: Secondary | ICD-10-CM | POA: Diagnosis not present

## 2021-07-13 DIAGNOSIS — R269 Unspecified abnormalities of gait and mobility: Secondary | ICD-10-CM | POA: Diagnosis not present

## 2021-07-13 NOTE — Therapy (Signed)
[place] ?[place] [facility] ?[place], [place], [address] ?Phone: [phone]   Fax:  [phone] ? ?Occupational Therapy Treatment ? ?Patient Details  ?Name: David Reeves ?MRN: [id number] ?Date of Birth: [birth year] ?Referring Provider (OT): [name] ? ? ?Encounter Date: 07/13/2021 ? ? OT End of Session - 07/13/21 1953   ? ? Visit Number 16   ? Number of Visits 24   ? Date for OT Re-Evaluation 08/15/21   ? Authorization Type Progress report period starting 06/22/2021   ? OT Start Time 0900   ? OT Stop Time 1916   ? OT Time Calculation (min) 45 min   ? Activity Tolerance Patient tolerated treatment well   ? Behavior During Therapy [facility] for tasks assessed/performed   ? ?  ?  ? ?  ? ? ?Past Medical History:  ?Diagnosis Date  ? Back pain 04/22/2012  ? Bone spur   ? Bulging disc 04/22/2012  ? Degenerative disc disease   ? Osteoarthritis   ? Panic anxiety syndrome   ? Stroke [facility]) 10/2020  ? Taking multiple medications for chronic disease   ? ? ?Past Surgical History:  ?Procedure Laterality Date  ? CYSTECTOMY    ? head  ? HERNIA REPAIR Left 2006,2014  ? [facility]  ? TOE SURGERY Right 2007  ? ? ?There were no vitals filed for this visit. ? ? Subjective Assessment - 07/13/21 1950   ? ? Subjective  Pt reports his goal is to ride his motorcycle again.   ? Patient is accompanied by: Family member   ? Pertinent History Pt. is a 51 y.o. who was diagnosed with a CVA on July 21st, 2022. Pt. completed several weeks of  inpatient rehab at [facility]. After returning to home, Pt. sustained a fall in december of 2022, and was admitted to the hospital with COVI-19, and back pain from the fall. Since the most recent discharge, Pt. has been residing with the ex-wife until the Pt. is ready to return to independent living. Pt. PMHx includes: Bilateral LBP with right sided sciatica, Lumbosacral spondylosis myelopathy, closed compression Fx of L1. Pt. enjoys cooking, and riding  motorcycles.   ? Patient Stated Goals Pt would like to be as independent as he was before, improve hand function.   ? Currently in Pain? Yes   ? Pain Score 4    ? Pain Location Back   ? Pain Orientation Lower   ? Pain Descriptors / Indicators Aching   ? Pain Type Chronic pain   ? Pain Radiating Towards low back   ? Pain Onset More than a month ago   ? Pain Frequency Intermittent   ? Aggravating Factors  prolonged sit<>stand, bending over, stooping, reaching   ? Pain Relieving Factors heat, meds, rest, repositioning, laying down   ? Effect of Pain on Daily Activities decreased activity tolerance   ? Multiple Pain Sites No   ? ?  ?  ? ?  ? ?Occupational Therapy Treatment: ?Therapeutic Exercise: ?Performed RUE strengthening donning 1 lb wrist weight to perform shoulder flex, abd, horiz add with reach to L shoulder, ER with reach to top of head, IR with reach behind back, elbow flex, and elbow ext against gravity; min guard to guide d/t ataxia with completion of 2 sets 7 reps each, rest between sets.  Performed AROM for R hand digit abd/add and wrist flex/ext with hand taps on table top x10 each.  Performed hand gripper with green  band for 3 sets 10 reps each.   ? ?Neuro re-ed: ?Worked with [place] circular blocks to place 10 blocks in grid; removed blocks by reaching to discard blocks into elevated bucket to engage R shoulder flexion.  Pt required non-skid surface (towel) to successfully pick up blocks from table top, and required positioning of blocks separate from one another to ease set up of block in hand.  Frequent rest breaks needed d/t hand fatigue, and to perform WB for tone reduction in hand.  Practiced opening/closing bathroom door (lever) with R hand with good accuracy.   ? ?Response to Treatment: ?See Plan/clinical impression below. ? ? ? ? ? ? ? ? OT Education - 07/13/21 1953   ? ? Education Details RUE strengthening/functional use   ? Person(s) Educated Patient   ? Methods  Explanation;Demonstration;Tactile cues;Verbal cues   ? Comprehension Verbalized understanding;Returned demonstration;Verbal cues required;Tactile cues required;Need further instruction   ? ?  ?  ? ?  ? ? ? OT Short Term Goals - 06/22/21 1147   ? ?  ? OT SHORT TERM GOAL #1  ? Title Pt. wil demonstrtae independence with HEPs   ? Baseline Eval: Pt. currently does not have one, 10th visit:  changes in HEP ongoing   ? Time 6   ? Period Weeks   ? Status On-going   ? Target Date 07/04/21   ? ?  ?  ? ?  ? ? ? ? OT Long Term Goals - 06/22/21 1147   ? ?  ? OT LONG TERM GOAL #1  ? Title Pt. will improve RUE strength by 2 mm grades to assist with ADLs, and IADLs,   ? Baseline Eval: Right shoulder flexion, abduction 3/5, elbow flexion, extension wrist extension 3+/5, 10th visit: improving with RUE strength by not yet met goal   ? Time 12   ? Period Weeks   ? Status On-going   ? Target Date 08/15/21   ?  ? OT LONG TERM GOAL #2  ? Title Pt. will improve right grip by 10 lbs to prepare for firmly holding objects for IADLs.   ? Baseline Eval: R: 38#, L: 124# (In 3rd dynamometer slot)   ? Time 12   ? Period Weeks   ? Status On-going   ? Target Date 08/15/21   ?  ? OT LONG TERM GOAL #3  ? Title Pt. will improve right lateral pinch strength by 5# to assist with cutting food   ? Baseline Eval: R: 17, L: 25   ? Time 12   ? Period Weeks   ? Status On-going   ? Target Date 08/15/21   ?  ? OT LONG TERM GOAL #4  ? Title Pt. will improve Right 3pt pinch by 2# to be able to hold/open items for cooking   ? Baseline Eval: R: Pt. unable to engage thumb L: 29#   ? Time 12   ? Period Weeks   ? Status On-going   ? Target Date 08/15/21   ?  ? OT LONG TERM GOAL #5  ? Title Pt. will right hand [facility] skills to be able to independently manipulate buttons, and zippers.   ? Baseline Eval: Pt. has difficulty managing buttons,a nd zippers. 10th visit:  improving   ? Time 12   ? Period Weeks   ? Status On-going   ? Target Date 08/15/21   ?  ? OT LONG TERM GOAL  #6  ? Title Pt. will independently write  his name   ? Baseline Eval: Pt. is unable to hold a pen   ? Time 12   ? Period Weeks   ? Status On-going   ? Target Date 05/24/21   ?  ? OT LONG TERM GOAL #7  ? Title Pt. will improve FOTO score by 2 points to reflect funational improvement   ? Baseline Eval: FOT score 46 with TR score 52   ? Time 12   ? Period Weeks   ? Status On-going   ? Target Date 08/15/21   ? ?  ?  ? ?  ? ? ? ? ? ? ? ? Plan - 07/12/21 2006   ? ? Clinical Impression Statement Pt reports he continues to engage RUE into as many tasks as possible at home, and is consistently using R hand to open/close doors and car door.  Good tolerance to all exercises for RUE with frequent rest breaks and intermittent WB to R hand for tone reduction.  Pt performed all activities with moist heat to back for pain management throughout session.  Pt will continue to benefit from skilled OT for increasing strength, coordination, and functional use of RUE for daily tasks.   ? OT Occupational Profile and History Detailed Assessment- Review of Records and additional review of physical, cognitive, psychosocial history related to current functional performance   ? Occupational performance deficits (Please refer to evaluation for details): ADL's;IADL's;Education   ? Rehab Potential Good   ? Clinical Decision Making Several treatment options, min-mod task modification necessary   ? Comorbidities Affecting Occupational Performance: May have comorbidities impacting occupational performance   ? Modification or Assistance to Complete Evaluation  Min-Moderate modification of tasks or assist with assess necessary to complete eval   ? OT Frequency 2x / week   ? OT Duration 12 weeks   ? OT Treatment/Interventions Self-care/ADL training;DME and/or AE instruction;Therapeutic exercise;Ultrasound;Neuromuscular education;Therapeutic activities;Energy conservation;Moist Heat;Patient/family education;Splinting;Functional Mobility Training;Paraffin    ? Consulted and Agree with Plan of Care Patient   ? ?  ?  ? ?  ? ? ?Patient will benefit from skilled therapeutic intervention in order to improve the following deficits and impairments:   ?  ?  ?  ? ? ?Visit Diagnosis: ?Muscle weakness (

## 2021-07-14 NOTE — Patient Instructions (Signed)
Use pacing board °

## 2021-07-14 NOTE — Therapy (Signed)
[place] ?[facility] ?[address] ?[place], [place], [address] ?Phone: [phone]   Fax:  [phone] ? ?Speech Language Pathology Treatment ? ?Patient Details  ?Name: David Reeves ?MRN: [id number] ?Date of Birth: 1970/10/20 ?Referring Provider (SLP): Alfredia Ferguson ? ? ?Encounter Date: 07/13/2021 ? ? End of Session - 07/14/21 0754   ? ? Visit Number 6   ? Number of Visits 25   ? Date for SLP Re-Evaluation 08/27/21   ? Authorization Type [employer] H[employer]   ? Authorization Time Period 06/04/2021 thru 08/27/2021   ? Authorization - Visit Number 6   ? Progress Note Due on Visit 10   ? SLP Start Time 0800   ? SLP Stop Time  0900   ? SLP Time Calculation (min) 60 min   ? Activity Tolerance Patient tolerated treatment well   ? ?  ?  ? ?  ? ? ?Past Medical History:  ?Diagnosis Date  ? Back pain 04/22/2012  ? Bone spur   ? Bulging disc 04/22/2012  ? Degenerative disc disease   ? Osteoarthritis   ? Panic anxiety syndrome   ? Stroke [facility]) 10/2020  ? Taking multiple medications for chronic disease   ? ? ?Past Surgical History:  ?Procedure Laterality Date  ? CYSTECTOMY    ? head  ? HERNIA REPAIR Left 2006,2014  ? [facility]  ? TOE SURGERY Right 2007  ? ? ?There were no vitals filed for this visit. ? ? Subjective Assessment - 07/14/21 0750   ? ? Subjective pt pleasant "glad it is Friday"   ? Currently in Pain? Yes   ? ?  ?  ? ?  ? ? ? ? ? ? ? ? ADULT SLP TREATMENT - 07/14/21 0001   ? ?  ? Treatment Provided  ? Treatment provided Cognitive-Linquistic   ?  ? Cognitive-Linquistic Treatment  ? Treatment focused on Aphasia;Apraxia;Patient/family/caregiver education   ? Skilled Treatment Skilled treatment session focused on pt's speech intelligibility specifically using pacing board.  ? ?SLP facilitated session by providing the following interventions:     ? ?Use of speech intelligibility strategies: Increased cognitive load by having pt instruction this Clinical research associate in the game of RUMMY.  Overall pt required intermittent cues to use pacing board throughout session (4 cues). When using pacing board pt's speech was more fluent with intelligibility increasing to > 95% at the simple conversation level.   ? ?  ?  ? ?  ? ? ? SLP Education - 07/14/21 0754   ? ? Education Details speech intelligibility and word finding strategies   ? Person(s) Educated Patient   ? Methods Explanation;Demonstration;Verbal cues   ? Comprehension Verbalized understanding;Returned demonstration;Need further instruction   ? ?  ?  ? ?  ? ? ? SLP Short Term Goals - 06/04/21 1536   ? ?  ? SLP SHORT TERM GOAL #1  ? Title Pt will use the speech intelligibility strategy of pacing to slow rate of speech at the simple sentence level in 8 out of 10 opportunities.   ? Baseline < 25% speech intelligiblity d/t fast rate of speech   ? Time 10   ? Period --   sessions  ? Status New   ?  ? SLP SHORT TERM GOAL #2  ? Title Pt will produce fricatives in the initial, final and medial positions of words with 90% accuracy.   ? Baseline pt stops all fricatives   ? Time 10   ?  Period --   sessions  ? Status New   ?  ? SLP SHORT TERM GOAL #3  ? Title Pt will use speech intelligibility strategy of overarticulation to improve speech intelligibility to 75% at the simple sentence level.   ? Baseline < 25% speech intelligibili   ? Time 10   ? Period --   sessions  ? Status New   ?  ? SLP SHORT TERM GOAL #4  ? Title Pt will answer WH questions after reading paragraph level passage with 75% accuracy.   ? Baseline new goal   ? Time 10   ? Period --   sessions  ? Status New   ? ?  ?  ? ?  ? ? ? SLP Long Term Goals - 06/04/21 1540   ? ?  ? SLP LONG TERM GOAL #1  ? Title Pt will use speech intelligibility stategies to achieve 50% speech intelligibility at the sentence level.   ? Baseline < 25% speech intelligibility   ? Time 12   ? Period Weeks   ? Status New   ? Target Date 08/27/21   ? ?  ?  ? ?  ? ? ? Plan - 07/14/21 0754   ? ? 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 While pt demonstrated improved use of pacing during today's session, he needed reminder to take out pacing board for OT session.  As a result, skilled ST intervention is required to instruct pt in speech intelligibility strategies to increase functional independence and reduce caregiver burden.   ? Speech Therapy Frequency 2x / week   ? Duration 12 weeks   ? Treatment/Interventions Language facilitation;Compensatory techniques;Cueing hierarchy;Internal/external aids;SLP instruction and feedback;Patient/family education;Compensatory strategies   ? Potential to Achieve Goals Good   ? SLP Home Exercise Plan provided, see pt instructions section   ? Consulted and Agree with Plan of Care Patient   ? ?  ?  ? ?  ? ? ?Patient will benefit from skilled therapeutic intervention in order to improve the following deficits and impairments:   ?Aphasia ? ?Apraxia ? ?H/O ischemic left MCA stroke ? ?H/O ischemic left ACA stroke ? ? ? ?Problem List ?Patient Active Problem List  ? Diagnosis Date Noted  ? Alterations of sensations following cerebrovascular accident 03/08/2021  ? Aphasia as late effect of cerebrovascular accident (CVA) 03/08/2021  ? Muscle spasm 02/22/2021  ? Acute non-recurrent frontal sinusitis 02/22/2021  ? Chronic pain syndrome 01/23/2021  ? Primary hypertension 01/23/2021  ? Nerve pain 01/23/2021  ? Erectile dysfunction due to diseases classified elsewhere 01/23/2021  ? Cognitive dysfunction 01/23/2021  ? Scrotal edema 01/23/2021  ? Elevated LDL cholesterol level 01/23/2021  ? History of stroke with residual deficit 01/23/2021  ? Primary insomnia 01/23/2021  ? Mouth pain 01/23/2021  ? Laceration of right hand without foreign body   ? Closed compression fracture of L1 vertebra (HCC) 05/24/2016  ? Lumbar stenosis with neurogenic claudication 11/01/2015  ? Chronic bilateral low back pain with  right-sided sciatica 11/08/2014  ? Hx of hemorrhoids 11/08/2014  ? AAA (abdominal aortic aneurysm) (HCC) 09/22/2014  ? Chronic pain associated with significant psychosocial dysfunction 09/22/2014  ? Panic disorder 09/22/2014  ? AB (asthmatic bronchitis) 08/17/2014  ? Anxiety 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 08/17/2014  ? Backache 08/17/2014  ? Lumbosacral spondylosis without myelopathy 08/17/2014  ? Disorder of male genital organs  08/17/2014  ? Brash 08/17/2014  ? Low back pain 08/17/2014  ? Tendon nodule 08/17/2014  ? Episodic paroxysmal anxiety disorder 08/17/2014  ? Hernia, inguinal, right 08/17/2014  ? Fast heart beat 08/17/2014  ? Illness 08/17/2014  ? Inguinal hernia 10/14/2012  ? ?Londyn Wotton B. Dreama Saa, M.S., CCC-SLP, CBIS ?Speech-Language Pathologist ?Rehabilitation Services ?Office [phone] ? ?Brantleigh Mifflin, CCC-SLP ?07/14/2021, 7:56 AM ? ?[place] ?[facility] ?[address] ?[place], [place], [address] ?Phone: [phone]   Fax:  [phone] ? ? ?Name: David Reeves ?MRN: [id number] ?Date of Birth: [birth year] ? ?

## 2021-07-16 ENCOUNTER — Ambulatory Visit: Payer: Medicare HMO | Admitting: Occupational Therapy

## 2021-07-17 ENCOUNTER — Ambulatory Visit: Payer: Medicare HMO | Admitting: Occupational Therapy

## 2021-07-17 ENCOUNTER — Other Ambulatory Visit: Payer: Self-pay | Admitting: Physician Assistant

## 2021-07-17 ENCOUNTER — Other Ambulatory Visit: Payer: Self-pay

## 2021-07-17 ENCOUNTER — Ambulatory Visit: Payer: Medicare HMO | Admitting: Speech Pathology

## 2021-07-17 ENCOUNTER — Encounter: Payer: Self-pay | Admitting: Occupational Therapy

## 2021-07-17 DIAGNOSIS — R2681 Unsteadiness on feet: Secondary | ICD-10-CM | POA: Diagnosis not present

## 2021-07-17 DIAGNOSIS — M6281 Muscle weakness (generalized): Secondary | ICD-10-CM | POA: Diagnosis not present

## 2021-07-17 DIAGNOSIS — Z8673 Personal history of transient ischemic attack (TIA), and cerebral infarction without residual deficits: Secondary | ICD-10-CM | POA: Diagnosis not present

## 2021-07-17 DIAGNOSIS — R482 Apraxia: Secondary | ICD-10-CM | POA: Diagnosis not present

## 2021-07-17 DIAGNOSIS — R262 Difficulty in walking, not elsewhere classified: Secondary | ICD-10-CM | POA: Diagnosis not present

## 2021-07-17 DIAGNOSIS — R278 Other lack of coordination: Secondary | ICD-10-CM | POA: Diagnosis not present

## 2021-07-17 DIAGNOSIS — R4701 Aphasia: Secondary | ICD-10-CM | POA: Diagnosis not present

## 2021-07-17 DIAGNOSIS — R269 Unspecified abnormalities of gait and mobility: Secondary | ICD-10-CM | POA: Diagnosis not present

## 2021-07-17 DIAGNOSIS — R2689 Other abnormalities of gait and mobility: Secondary | ICD-10-CM | POA: Diagnosis not present

## 2021-07-17 DIAGNOSIS — G894 Chronic pain syndrome: Secondary | ICD-10-CM

## 2021-07-17 NOTE — Therapy (Signed)
[place] ?[place] [facility] ?[place], [place], [address] ?Phone: [phone]   Fax:  [phone] ? ?Occupational Therapy Treatment ? ?Patient Details  ?Name: David Reeves ?MRN: [id number] ?Date of Birth: October 05, 1970 ?Referring Provider (OT): [name] ? ? ?Encounter Date: 07/17/2021 ? ? OT End of Session - 07/17/21 1123   ? ? Visit Number 17   ? Number of Visits 24   ? Date for OT Re-Evaluation 08/15/21   ? Authorization Type Progress report period starting 06/22/2021   ? OT Start Time 0845   ? OT Stop Time 0930   ? OT Time Calculation (min) 45 min   ? Activity Tolerance Patient tolerated treatment well   ? Behavior During Therapy [facility] for tasks assessed/performed   ? ?  ?  ? ?  ? ? ?Past Medical History:  ?Diagnosis Date  ? Back pain 04/22/2012  ? Bone spur   ? Bulging disc 04/22/2012  ? Degenerative disc disease   ? Osteoarthritis   ? Panic anxiety syndrome   ? Stroke [facility]) 10/2020  ? Taking multiple medications for chronic disease   ? ? ?Past Surgical History:  ?Procedure Laterality Date  ? CYSTECTOMY    ? head  ? HERNIA REPAIR Left 2006,2014  ? [facility]  ? TOE SURGERY Right 2007  ? ? ?There were no vitals filed for this visit. ? ? Subjective Assessment - 07/17/21 1123   ? ? Subjective  Pt reports his goal is to ride his motorcycle again.   ? Patient is accompanied by: Family member   ? Pertinent History Pt. is a 51 y.o. who was diagnosed with a CVA on July 21st, 2022. Pt. completed several weeks of  inpatient rehab at [facility]. After returning to home, Pt. sustained a fall in december of 2022, and was admitted to the hospital with COVI-19, and back pain from the fall. Since the most recent discharge, Pt. has been residing with the ex-wife until the Pt. is ready to return to independent living. Pt. PMHx includes: Bilateral LBP with right sided sciatica, Lumbosacral spondylosis myelopathy, closed compression Fx of L1. Pt. enjoys cooking, and riding  motorcycles.   ? Patient Stated Goals Pt would like to be as independent as he was before, improve hand function.   ? Currently in Pain? Yes   ? Pain Score 3    ? Pain Location Back   ? Pain Orientation Lower   ? Pain Descriptors / Indicators Aching   ? ?  ?  ? ?  ? ?OT TREATMENT   ?  ?Neuro muscular re-education: ?  ?Pt. worked on [facility] skills grasping 4", 3",  and 2" pegs from a vertical position.  Pt. worked on extending the thumb IP joint, and bringing it into position when formulating 2pt. And 3pt. grasping patterns with the pegs. ?  ?Therapeutic Exercise: ?  ?Pt. worked on [employer], and reciprocal motion using the UBE while seated for 8 min. with minimal resistance. Constant monitoring was provided. Pt. performed 2.5# dowel ex. For UE strengthening secondary to weakness. Bilateral shoulder flexion, chest press, circular patterns, and elbow flexion/extension were performed. Pt. performed gross gripping with a gross grip strengthener. Pt. worked on sustaining grip while grasping pegs and reaching at various heights. The Gripper was set to  6.6 # of grip strength resistance.    ?  ?Pt. is progressing with the RUE, and hand. Pt. required reps of alternating weightbearing, and proprioceptive input during  the [facility] tasks to inhibit the tone in the right hand. Pt. was able to clear 10 pegs with 6.6# of resistance. Pt continues to have difficulty bringing his thumb into position to make contact with the objects when grasping the Pegs.  Pt. continues to work on improving RUE strength, and [facility] skills in order to work towards improving, and maximizing independence with ADLs, and IADL tasks.  ?  ?  ? ? ? ? ? ? ? ? ? ? ? ? ? ? ? ? ? ? ? ? ? OT Education - 07/17/21 1123   ? ? Education Details RUE strengthening/functional use   ? Person(s) Educated Patient   ? Methods Explanation;Demonstration;Tactile cues;Verbal cues   ? Comprehension Verbalized understanding;Returned demonstration;Verbal cues required;Tactile cues  required;Need further instruction   ? ?  ?  ? ?  ? ? ? OT Short Term Goals - 06/22/21 1147   ? ?  ? OT SHORT TERM GOAL #1  ? Title Pt. wil demonstrtae independence with HEPs   ? Baseline Eval: Pt. currently does not have one, 10th visit:  changes in HEP ongoing   ? Time 6   ? Period Weeks   ? Status On-going   ? Target Date 07/04/21   ? ?  ?  ? ?  ? ? ? ? OT Long Term Goals - 06/22/21 1147   ? ?  ? OT LONG TERM GOAL #1  ? Title Pt. will improve RUE strength by 2 mm grades to assist with ADLs, and IADLs,   ? Baseline Eval: Right shoulder flexion, abduction 3/5, elbow flexion, extension wrist extension 3+/5, 10th visit: improving with RUE strength by not yet met goal   ? Time 12   ? Period Weeks   ? Status On-going   ? Target Date 08/15/21   ?  ? OT LONG TERM GOAL #2  ? Title Pt. will improve right grip by 10 lbs to prepare for firmly holding objects for IADLs.   ? Baseline Eval: R: 38#, L: 124# (In 3rd dynamometer slot)   ? Time 12   ? Period Weeks   ? Status On-going   ? Target Date 08/15/21   ?  ? OT LONG TERM GOAL #3  ? Title Pt. will improve right lateral pinch strength by 5# to assist with cutting food   ? Baseline Eval: R: 17, L: 25   ? Time 12   ? Period Weeks   ? Status On-going   ? Target Date 08/15/21   ?  ? OT LONG TERM GOAL #4  ? Title Pt. will improve Right 3pt pinch by 2# to be able to hold/open items for cooking   ? Baseline Eval: R: Pt. unable to engage thumb L: 29#   ? Time 12   ? Period Weeks   ? Status On-going   ? Target Date 08/15/21   ?  ? OT LONG TERM GOAL #5  ? Title Pt. will right hand [facility] skills to be able to independently manipulate buttons, and zippers.   ? Baseline Eval: Pt. has difficulty managing buttons,a nd zippers. 10th visit:  improving   ? Time 12   ? Period Weeks   ? Status On-going   ? Target Date 08/15/21   ?  ? OT LONG TERM GOAL #6  ? Title Pt. will independently write his name   ? Baseline Eval: Pt. is unable to hold a pen   ? Time 12   ? Period  Weeks   ? Status On-going   ?  Target Date 05/24/21   ?  ? OT LONG TERM GOAL #7  ? Title Pt. will improve FOTO score by 2 points to reflect funational improvement   ? Baseline Eval: FOT score 46 with TR score 52   ? Time 12   ? Period Weeks   ? Status On-going   ? Target Date 08/15/21   ? ?  ?  ? ?  ? ? ? ? ? ? ? ? Plan - 07/17/21 1124   ? ? Clinical Impression Statement Pt. is progressing with the RUE, and hand. Pt. required reps of alternating weightbearing, and proprioceptive input during the [facility] tasks to inhibit the tone in the right hand. Pt. was able to clear 10 pegs with 6.6# of resistance. Pt continues to have difficulty bringing his thumb into position to make contact with the objects when grasping the Pegs.  Pt. continues to work on improving RUE strength, and [facility] skills in order to work towards improving, and maximizing independence with ADLs, and IADL tasks.  ?   ? OT Occupational Profile and History Detailed Assessment- Review of Records and additional review of physical, cognitive, psychosocial history related to current functional performance   ? Occupational performance deficits (Please refer to evaluation for details): ADL's;IADL's;Education   ? Rehab Potential Good   ? Clinical Decision Making Several treatment options, min-mod task modification necessary   ? Comorbidities Affecting Occupational Performance: May have comorbidities impacting occupational performance   ? Modification or Assistance to Complete Evaluation  Min-Moderate modification of tasks or assist with assess necessary to complete eval   ? OT Frequency 2x / week   ? OT Duration 12 weeks   ? OT Treatment/Interventions Self-care/ADL training;DME and/or AE instruction;Therapeutic exercise;Ultrasound;Neuromuscular education;Therapeutic activities;Energy conservation;Moist Heat;Patient/family education;Splinting;Functional Mobility Training;Paraffin   ? Consulted and Agree with Plan of Care Patient   ? ?  ?  ? ?  ? ? ?Patient will benefit from skilled therapeutic  intervention in order to improve the following deficits and impairments:   ?  ?  ?  ? ? ?Visit Diagnosis: ?Muscle weakness (generalized) ? ?Other lack of coordination ? ? ? ?Problem List ?Patient Active Problem List

## 2021-07-18 ENCOUNTER — Ambulatory Visit: Payer: Medicare HMO | Admitting: Occupational Therapy

## 2021-07-18 NOTE — Patient Instructions (Signed)
Take out pacing board for OT session ?

## 2021-07-18 NOTE — Therapy (Signed)
[place] ?[facility] ?[address] ?[place], [place], [address] ?Phone: [phone]   Fax:  [phone] ? ?Speech Language Pathology Treatment ? ?Patient Details  ?Name: David Reeves ?MRN: [id number] ?Date of Birth: May 22, 1970 ?Referring Provider (SLP): Alfredia Ferguson ? ? ?Encounter Date: 07/17/2021 ? ? End of Session - 07/18/21 1301   ? ? Visit Number 7   ? Number of Visits 25   ? Date for SLP Re-Evaluation 08/27/21   ? Authorization Type [employer] Medicare HMO   ? Authorization Time Period 06/04/2021 thru 08/27/2021   ? Authorization - Visit Number 7   ? Progress Note Due on Visit 10   ? SLP Start Time 0800   ? SLP Stop Time  0845   ? SLP Time Calculation (min) 45 min   ? Activity Tolerance Patient tolerated treatment well   ? ?  ?  ? ?  ? ? ?Past Medical History:  ?Diagnosis Date  ? Back pain 04/22/2012  ? Bone spur   ? Bulging disc 04/22/2012  ? Degenerative disc disease   ? Osteoarthritis   ? Panic anxiety syndrome   ? Stroke [facility]) 10/2020  ? Taking multiple medications for chronic disease   ? ? ?Past Surgical History:  ?Procedure Laterality Date  ? CYSTECTOMY    ? head  ? HERNIA REPAIR Left 2006,2014  ? [facility]  ? TOE SURGERY Right 2007  ? ? ?There were no vitals filed for this visit. ? ? Subjective Assessment - 07/18/21 1258   ? ? Subjective pt pleasant, eager, motivated   ? Currently in Pain? No/denies   ? ?  ?  ? ?  ? ? ? ? ? ? ? ? ADULT SLP TREATMENT - 07/18/21 0001   ? ?  ? Treatment Provided  ? Treatment provided Cognitive-Linquistic   ?  ? Cognitive-Linquistic Treatment  ? Treatment focused on Aphasia;Apraxia;Patient/family/caregiver education   ? Skilled Treatment Skilled treatment session focused on pt's speech intelligibility specifically using pacing board. SLP facilitated session by providing the following interventions:    Use of speech intelligibility strategies: Increased cognitive load by instructing pt in novel card game of [place].  Pt took out his phone for pacing and was supervision level for use when asking questions about the game as well as conversational sentences regarding his motorcycle. When using pacing board pt's speech was more fluent with intelligibility increasing to > 95% at the simple conversation level. Pacing also appeared to help pt with word finding as it slowed his speech and appeared to provide some extra time for word finding.   ? ?  ?  ? ?  ? ? ? SLP Education - 07/18/21 1301   ? ? Education Details speech intelligibility and word finding strategies   ? Person(s) Educated Patient   ? Methods Explanation;Demonstration;Verbal cues   ? Comprehension Verbalized understanding;Returned demonstration;Need further instruction   ? ?  ?  ? ?  ? ? ? SLP Short Term Goals - 06/04/21 1536   ? ?  ? SLP SHORT TERM GOAL #1  ? Title Pt will use the speech intelligibility strategy of pacing to slow rate of speech at the simple sentence level in 8 out of 10 opportunities.   ? Baseline < 25% speech intelligiblity d/t fast rate of speech   ? Time 10   ? Period --   sessions  ? Status New   ?  ? SLP SHORT TERM GOAL #2  ?  Title Pt will produce fricatives in the initial, final and medial positions of words with 90% accuracy.   ? Baseline pt stops all fricatives   ? Time 10   ? Period --   sessions  ? Status New   ?  ? SLP SHORT TERM GOAL #3  ? Title Pt will use speech intelligibility strategy of overarticulation to improve speech intelligibility to 75% at the simple sentence level.   ? Baseline < 25% speech intelligibili   ? Time 10   ? Period --   sessions  ? Status New   ?  ? SLP SHORT TERM GOAL #4  ? Title Pt will answer WH questions after reading paragraph level passage with 75% accuracy.   ? Baseline new goal   ? Time 10   ? Period --   sessions  ? Status New   ? ?  ?  ? ?  ? ? ? SLP Long Term Goals - 06/04/21 1540   ? ?  ? SLP LONG TERM GOAL #1  ? Title Pt will use speech intelligibility stategies to achieve 50% speech intelligibility at  the sentence level.   ? Baseline < 25% speech intelligibility   ? Time 12   ? Period Weeks   ? Status New   ? Target Date 08/27/21   ? ?  ?  ? ?  ? ? ? Plan - 07/18/21 1302   ? ? 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 While pt demonstrated improved use of pacing during today's session, he needed reminder to take out pacing board for OT session.  As a result, skilled ST intervention is required to instruct pt in speech intelligibility strategies to increase functional independence and reduce caregiver burden.   ? Speech Therapy Frequency 2x / week   ? Duration 12 weeks   ? Treatment/Interventions Language facilitation;Compensatory techniques;Cueing hierarchy;Internal/external aids;SLP instruction and feedback;Patient/family education;Compensatory strategies   ? Potential to Achieve Goals Good   ? SLP Home Exercise Plan provided, see pt instructions section   ? Consulted and Agree with Plan of Care Patient   ? ?  ?  ? ?  ? ? ?Patient will benefit from skilled therapeutic intervention in order to improve the following deficits and impairments:   ?Aphasia ? ?Apraxia ? ?H/O ischemic left MCA stroke ? ?H/O ischemic left ACA stroke ? ? ? ?Problem List ?Patient Active Problem List  ? Diagnosis Date Noted  ? Alterations of sensations following cerebrovascular accident 03/08/2021  ? Aphasia as late effect of cerebrovascular accident (CVA) 03/08/2021  ? Muscle spasm 02/22/2021  ? Acute non-recurrent frontal sinusitis 02/22/2021  ? Chronic pain syndrome 01/23/2021  ? Primary hypertension 01/23/2021  ? Nerve pain 01/23/2021  ? Erectile dysfunction due to diseases classified elsewhere 01/23/2021  ? Cognitive dysfunction 01/23/2021  ? Scrotal edema 01/23/2021  ? Elevated LDL cholesterol level 01/23/2021  ? History of stroke with residual deficit 01/23/2021  ? Primary insomnia 01/23/2021  ? Mouth pain 01/23/2021  ?  Laceration of right hand without foreign body   ? Closed compression fracture of L1 vertebra (HCC) 05/24/2016  ? Lumbar stenosis with neurogenic claudication 11/01/2015  ? Chronic 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 11/08/2014  ? Hx of hemorrhoids 11/08/2014  ? AAA (abdominal aortic aneurysm) (HCC) 09/22/2014  ? Chronic pain associated with significant psychosocial dysfunction 09/22/2014  ? Panic disorder 09/22/2014  ?  AB (asthmatic bronchitis) 08/17/2014  ? Anxiety 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 08/17/2014  ? Backache 08/17/2014  ? Lumbosacral spondylosis without myelopathy 08/17/2014  ? Disorder of male genital organs 08/17/2014  ? Brash 08/17/2014  ? Low back pain 08/17/2014  ? Tendon nodule 08/17/2014  ? Episodic paroxysmal anxiety disorder 08/17/2014  ? Hernia, inguinal, right 08/17/2014  ? Fast heart beat 08/17/2014  ? Illness 08/17/2014  ? Inguinal hernia 10/14/2012  ? ?Karina Nofsinger B. Dreama Saa, M.S., CCC-SLP, CBIS ?Speech-Language Pathologist ?Rehabilitation Services ?Office [phone] ? ?Sirron Francesconi, CCC-SLP ?07/18/2021, 1:03 PM ? ?[place] ?[facility] ?[address] ?[place], [place], [address] ?Phone: [phone]   Fax:  [phone] ? ? ?Name: Nico Syme ?MRN: [id number] ?Date of Birth: [birth year] ? ?

## 2021-07-19 ENCOUNTER — Encounter: Payer: Medicare HMO | Admitting: Occupational Therapy

## 2021-07-19 ENCOUNTER — Ambulatory Visit: Payer: Medicare HMO | Admitting: Physical Therapy

## 2021-07-19 ENCOUNTER — Ambulatory Visit: Payer: Medicare HMO | Admitting: Speech Pathology

## 2021-07-19 DIAGNOSIS — Z8673 Personal history of transient ischemic attack (TIA), and cerebral infarction without residual deficits: Secondary | ICD-10-CM | POA: Diagnosis not present

## 2021-07-19 DIAGNOSIS — R269 Unspecified abnormalities of gait and mobility: Secondary | ICD-10-CM | POA: Diagnosis not present

## 2021-07-19 DIAGNOSIS — R482 Apraxia: Secondary | ICD-10-CM

## 2021-07-19 DIAGNOSIS — R278 Other lack of coordination: Secondary | ICD-10-CM | POA: Diagnosis not present

## 2021-07-19 DIAGNOSIS — R262 Difficulty in walking, not elsewhere classified: Secondary | ICD-10-CM | POA: Diagnosis not present

## 2021-07-19 DIAGNOSIS — M6281 Muscle weakness (generalized): Secondary | ICD-10-CM | POA: Diagnosis not present

## 2021-07-19 DIAGNOSIS — R2689 Other abnormalities of gait and mobility: Secondary | ICD-10-CM | POA: Diagnosis not present

## 2021-07-19 DIAGNOSIS — R2681 Unsteadiness on feet: Secondary | ICD-10-CM | POA: Diagnosis not present

## 2021-07-19 DIAGNOSIS — R4701 Aphasia: Secondary | ICD-10-CM | POA: Diagnosis not present

## 2021-07-19 DIAGNOSIS — R6889 Other general symptoms and signs: Secondary | ICD-10-CM | POA: Diagnosis not present

## 2021-07-20 ENCOUNTER — Other Ambulatory Visit: Payer: Self-pay | Admitting: Family Medicine

## 2021-07-20 DIAGNOSIS — I10 Essential (primary) hypertension: Secondary | ICD-10-CM

## 2021-07-20 DIAGNOSIS — M62838 Other muscle spasm: Secondary | ICD-10-CM

## 2021-07-20 NOTE — Therapy (Signed)
[place] ?[facility] ?[address] ?[place], [place], [address] ?Phone: [phone]   Fax:  [phone] ? ?Speech Language Pathology Treatment ? ?Patient Details  ?Name: David Reeves ?MRN: [id number] ?Date of Birth: 28-Dec-1970 ?Referring Provider (SLP): Alfredia Ferguson ? ? ?Encounter Date: 07/19/2021 ? ? End of Session - 07/20/21 1614   ? ? Visit Number 8   ? Number of Visits 25   ? Date for SLP Re-Evaluation 08/27/21   ? Authorization Type [employer] Medicare HMO   ? Authorization Time Period 06/04/2021 thru 08/27/2021   ? Authorization - Visit Number 8   ? Progress Note Due on Visit 10   ? SLP Start Time 1005   ? SLP Stop Time  1100   ? SLP Time Calculation (min) 55 min   ? Activity Tolerance Patient tolerated treatment well   ? ?  ?  ? ?  ? ? ?Past Medical History:  ?Diagnosis Date  ? Back pain 04/22/2012  ? Bone spur   ? Bulging disc 04/22/2012  ? Degenerative disc disease   ? Osteoarthritis   ? Panic anxiety syndrome   ? Stroke [facility]) 10/2020  ? Taking multiple medications for chronic disease   ? ? ?Past Surgical History:  ?Procedure Laterality Date  ? CYSTECTOMY    ? head  ? HERNIA REPAIR Left 2006,2014  ? [facility]  ? TOE SURGERY Right 2007  ? ? ?There were no vitals filed for this visit. ? ? Subjective Assessment - 07/20/21 1614   ? ? Subjective pt pleasant, eager, motivated   ? Currently in Pain? No/denies   ? ?  ?  ? ?  ? ? ? ? ? ? ? ? ADULT SLP TREATMENT - 07/20/21 0001   ? ?  ? Treatment Provided  ? Treatment provided Cognitive-Linquistic   ?  ? Cognitive-Linquistic Treatment  ? Treatment focused on Aphasia;Apraxia;Patient/family/caregiver education   ? Skilled Treatment Skilled treatment session focused on pt's speech intelligibility specifically SLP facilitated session by providing the following interventions:    Use of speech intelligibility strategies: SLP provided min to moderate cues to use pacing with dots on back of pt's phone as pt tended to pace using  the table top instead resulting in moderately dysfluent speech; when using pacing board pt's speech intelligibility improves from 50% to 90% at the sentence level - SLP further facilitated by increasing cognitive load by pt recall rules to game, [place], as well as describing different genres of music - with increased attention to music, pt required moderate cues to use pacing board to improve his fluency of speech to 3 words instead of halting at 1 word   ? ?  ?  ? ?  ? ? ? SLP Education - 07/20/21 1614   ? ? Education Details speech intelligibility and word finding strategies   ? Person(s) Educated Patient   ? Methods Explanation;Demonstration;Verbal cues   ? Comprehension Verbalized understanding;Returned demonstration;Need further instruction   ? ?  ?  ? ?  ? ? ? SLP Short Term Goals - 06/04/21 1536   ? ?  ? SLP SHORT TERM GOAL #1  ? Title Pt will use the speech intelligibility strategy of pacing to slow rate of speech at the simple sentence level in 8 out of 10 opportunities.   ? Baseline < 25% speech intelligiblity d/t fast rate of speech   ? Time 10   ? Period --   sessions  ?  Status New   ?  ? SLP SHORT TERM GOAL #2  ? Title Pt will produce fricatives in the initial, final and medial positions of words with 90% accuracy.   ? Baseline pt stops all fricatives   ? Time 10   ? Period --   sessions  ? Status New   ?  ? SLP SHORT TERM GOAL #3  ? Title Pt will use speech intelligibility strategy of overarticulation to improve speech intelligibility to 75% at the simple sentence level.   ? Baseline < 25% speech intelligibili   ? Time 10   ? Period --   sessions  ? Status New   ?  ? SLP SHORT TERM GOAL #4  ? Title Pt will answer WH questions after reading paragraph level passage with 75% accuracy.   ? Baseline new goal   ? Time 10   ? Period --   sessions  ? Status New   ? ?  ?  ? ?  ? ? ? SLP Long Term Goals - 06/04/21 1540   ? ?  ? SLP LONG TERM GOAL #1  ? Title Pt will use speech intelligibility  stategies to achieve 50% speech intelligibility at the sentence level.   ? Baseline < 25% speech intelligibility   ? Time 12   ? Period Weeks   ? Status New   ? Target Date 08/27/21   ? ?  ?  ? ?  ? ? ? Plan - 07/20/21 1615   ? ? 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 As a result, skilled ST intervention is required to instruct pt in speech intelligibility strategies to increase functional independence and reduce caregiver burden.   ? Speech Therapy Frequency 2x / week   ? Duration 12 weeks   ? Treatment/Interventions Language facilitation;Compensatory techniques;Cueing hierarchy;Internal/external aids;SLP instruction and feedback;Patient/family education;Compensatory strategies   ? Potential to Achieve Goals Good   ? SLP Home Exercise Plan provided, see pt instructions section   ? Consulted and Agree with Plan of Care Patient   ? ?  ?  ? ?  ? ? ?Patient will benefit from skilled therapeutic intervention in order to improve the following deficits and impairments:   ?Aphasia ? ?Apraxia ? ?H/O ischemic left MCA stroke ? ?H/O ischemic left ACA stroke ? ? ? ?Problem List ?Patient Active Problem List  ? Diagnosis Date Noted  ? Alterations of sensations following cerebrovascular accident 03/08/2021  ? Aphasia as late effect of cerebrovascular accident (CVA) 03/08/2021  ? Muscle spasm 02/22/2021  ? Acute non-recurrent frontal sinusitis 02/22/2021  ? Chronic pain syndrome 01/23/2021  ? Primary hypertension 01/23/2021  ? Nerve pain 01/23/2021  ? Erectile dysfunction due to diseases classified elsewhere 01/23/2021  ? Cognitive dysfunction 01/23/2021  ? Scrotal edema 01/23/2021  ? Elevated LDL cholesterol level 01/23/2021  ? History of stroke with residual deficit 01/23/2021  ? Primary insomnia 01/23/2021  ? Mouth pain 01/23/2021  ? Laceration of right hand without foreign body   ? Closed compression fracture  of L1 vertebra (HCC) 05/24/2016  ? Lumbar stenosis with neurogenic claudication 11/01/2015  ? Chronic 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 11/08/2014  ? Hx of hemorrhoids 11/08/2014  ? AAA (abdominal aortic aneurysm) (HCC) 09/22/2014  ? Chronic pain associated with significant psychosocial dysfunction 09/22/2014  ? Panic disorder 09/22/2014  ? AB (asthmatic bronchitis) 08/17/2014  ? Anxiety  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 08/17/2014  ? Backache 08/17/2014  ? Lumbosacral spondylosis without myelopathy 08/17/2014  ? Disorder of male genital organs 08/17/2014  ? Brash 08/17/2014  ? Low back pain 08/17/2014  ? Tendon nodule 08/17/2014  ? Episodic paroxysmal anxiety disorder 08/17/2014  ? Hernia, inguinal, right 08/17/2014  ? Fast heart beat 08/17/2014  ? Illness 08/17/2014  ? Inguinal hernia 10/14/2012  ? ?Kursten Kruk B. Dreama Saaverton, M.S., CCC-SLP, CBIS ?Speech-Language Pathologist ?Rehabilitation Services ?Office [phone] ? ?Pristine Gladhill, CCC-SLP ?07/20/2021, 4:16 PM ? ?[place] ?[facility] ?[address] ?[place], [place], [address] ?Phone: [phone]   Fax:  [phone] ? ? ?Name: David Reeves ?MRN: [id number] ?Date of Birth: 1971-03-03 ? ?

## 2021-07-20 NOTE — Telephone Encounter (Signed)
Requested medication (s) are due for refill today - no ? ?Requested medication (s) are on the active medication list -no ? ?Future visit scheduled -yes ? ?Last refill: unknown ? ?Notes to clinic: Request RF: medication no longer current on medication list ? ?Requested Prescriptions  ?Pending Prescriptions Disp Refills  ? baclofen (LIORESAL) 20 MG tablet [Pharmacy Med Name: BACLOFEN 20 MG TABLET] 270 tablet 1  ?  Sig: TAKE 1 TABLET BY MOUTH THREE TIMES A DAY  ?  ? Analgesics:  Muscle Relaxants - baclofen Passed - 07/20/2021  1:57 AM  ?  ?  Passed - Cr in normal range and within 180 days  ?  Creatinine  ?Date Value Ref Range Status  ?11/14/2013 0.85 0.60 - 1.30 mg/dL Final  ? ?Creatinine, Ser  ?Date Value Ref Range Status  ?04/05/2021 0.68 0.61 - 1.24 mg/dL Final  ?  ?  ?  ?  Passed - eGFR is 30 or above and within 180 days  ?  EGFR (African American)  ?Date Value Ref Range Status  ?11/14/2013 >60  Final  ? ?GFR calc Af Wyvonnia Lora  ?Date Value Ref Range Status  ?02/15/2020 121 >59 mL/min/1.73 Final  ?  Comment:  ?  **In accordance with recommendations from the NKF-ASN Task force,** ?  [facility] is in the process of updating its eGFR calculation to the ?  2021 CKD-EPI creatinine equation that estimates kidney function ?  without a race variable. ?  ? ?EGFR (Non-African Amer.)  ?Date Value Ref Range Status  ?11/14/2013 >60  Final  ?  Comment:  ?  eGFR values <86m/min/1.73 m2 may be an indication of chronic ?kidney disease (CKD). ?Calculated eGFR is useful in patients with stable renal function. ?The eGFR calculation will not be reliable in acutely ill patients ?when serum creatinine is changing rapidly. It is not useful in  ?patients on dialysis. The eGFR calculation may not be applicable ?to patients at the low and high extremes of body sizes, pregnant ?women, and vegetarians. ?  ? ?GFR, Estimated  ?Date Value Ref Range Status  ?04/05/2021 >60 >60 mL/min Final  ?  Comment:  ?  (NOTE) ?Calculated using the CKD-EPI Creatinine  Equation (2021) ?  ? ?eGFR  ?Date Value Ref Range Status  ?12/14/2020 108 >59 mL/min/1.73 Final  ?  ?  ?  ?  Passed - Valid encounter within last 6 months  ?  Recent Outpatient Visits   ? ?      ? 2 months ago Alterations of sensations following cerebrovascular accident  ? [facility] LRia Comment PA-C  ? 4 months ago Alterations of sensations following cerebrovascular accident  ? BEffie PA-C  ? 4 months ago Muscle spasm  ? [facility] Sprout FNP  ? 5 months ago Nerve pain  ? [facility]  ? 7 months ago Cerebrovascular accident (CVA) due to occlusion of left middle cerebral artery ([place]  ? [place] PA-C  ? ?  ?  ?Future Appointments   ? ?        ? In 2 weeks DMikey Kirschner PA-C [facility]  ? ?  ? ?  ?  ?  ? ? ? ?Requested Prescriptions  ?Pending Prescriptions Disp Refills  ? baclofen (LIORESAL) 20 MG tablet [Pharmacy Med Name: BACLOFEN 20 MG TABLET] 270 tablet 1  ?  Sig: TAKE 1 TABLET BY MOUTH THREE TIMES A  DAY  ?  ? Analgesics:  Muscle Relaxants - baclofen Passed - 07/20/2021  1:57 AM  ?  ?  Passed - Cr in normal range and within 180 days  ?  Creatinine  ?Date Value Ref Range Status  ?11/14/2013 0.85 0.60 - 1.30 mg/dL Final  ? ?Creatinine, Ser  ?Date Value Ref Range Status  ?04/05/2021 0.68 0.61 - 1.24 mg/dL Final  ?  ?  ?  ?  Passed - eGFR is 30 or above and within 180 days  ?  EGFR (African American)  ?Date Value Ref Range Status  ?11/14/2013 >60  Final  ? ?GFR calc Af Wyvonnia Lora  ?Date Value Ref Range Status  ?02/15/2020 121 >59 mL/min/1.73 Final  ?  Comment:  ?  **In accordance with recommendations from the NKF-ASN Task force,** ?  [facility] is in the process of updating its eGFR calculation to the ?  2021 CKD-EPI creatinine equation that estimates kidney function ?  without a race variable. ?  ? ?EGFR (Non-African Amer.)  ?Date Value Ref Range  Status  ?11/14/2013 >60  Final  ?  Comment:  ?  eGFR values <52m/min/1.73 m2 may be an indication of chronic ?kidney disease (CKD). ?Calculated eGFR is useful in patients with stable renal function. ?The eGFR calculation will not be reliable in acutely ill patients ?when serum creatinine is changing rapidly. It is not useful in  ?patients on dialysis. The eGFR calculation may not be applicable ?to patients at the low and high extremes of body sizes, pregnant ?women, and vegetarians. ?  ? ?GFR, Estimated  ?Date Value Ref Range Status  ?04/05/2021 >60 >60 mL/min Final  ?  Comment:  ?  (NOTE) ?Calculated using the CKD-EPI Creatinine Equation (2021) ?  ? ?eGFR  ?Date Value Ref Range Status  ?12/14/2020 108 >59 mL/min/1.73 Final  ?  ?  ?  ?  Passed - Valid encounter within last 6 months  ?  Recent Outpatient Visits   ? ?      ? 2 months ago Alterations of sensations following cerebrovascular accident  ? [facility] LRia Comment PA-C  ? 4 months ago Alterations of sensations following cerebrovascular accident  ? BM[place] PA-C  ? 4 months ago Muscle spasm  ? [facility] FNP  ? 5 months ago Nerve pain  ? [facility] F[facility]  ? 7 months ago Cerebrovascular accident (CVA) due to occlusion of left middle cerebral artery ([place]  ? BV[place] PA-C  ? ?  ?  ?Future Appointments   ? ?        ? In 2 weeks DMikey Kirschner PA-C [facility]  ? ?  ? ?  ?  ?  ? ? ? ?

## 2021-07-23 ENCOUNTER — Ambulatory Visit: Payer: Medicare HMO | Admitting: Occupational Therapy

## 2021-07-23 ENCOUNTER — Encounter: Payer: Self-pay | Admitting: Physician Assistant

## 2021-07-23 ENCOUNTER — Other Ambulatory Visit: Payer: Self-pay | Admitting: Physician Assistant

## 2021-07-23 ENCOUNTER — Encounter: Payer: Medicare HMO | Admitting: Speech Pathology

## 2021-07-23 DIAGNOSIS — G894 Chronic pain syndrome: Secondary | ICD-10-CM

## 2021-07-24 ENCOUNTER — Ambulatory Visit: Payer: Medicare HMO | Admitting: Physical Therapy

## 2021-07-24 ENCOUNTER — Encounter: Payer: Self-pay | Admitting: Physical Therapy

## 2021-07-24 ENCOUNTER — Ambulatory Visit: Payer: Medicare HMO | Admitting: Occupational Therapy

## 2021-07-24 ENCOUNTER — Ambulatory Visit: Payer: Medicare HMO | Attending: Physician Assistant | Admitting: Speech Pathology

## 2021-07-24 ENCOUNTER — Encounter: Payer: Self-pay | Admitting: Occupational Therapy

## 2021-07-24 DIAGNOSIS — M545 Low back pain, unspecified: Secondary | ICD-10-CM | POA: Diagnosis not present

## 2021-07-24 DIAGNOSIS — R278 Other lack of coordination: Secondary | ICD-10-CM

## 2021-07-24 DIAGNOSIS — R262 Difficulty in walking, not elsewhere classified: Secondary | ICD-10-CM | POA: Insufficient documentation

## 2021-07-24 DIAGNOSIS — R482 Apraxia: Secondary | ICD-10-CM | POA: Diagnosis not present

## 2021-07-24 DIAGNOSIS — R41841 Cognitive communication deficit: Secondary | ICD-10-CM | POA: Insufficient documentation

## 2021-07-24 DIAGNOSIS — R269 Unspecified abnormalities of gait and mobility: Secondary | ICD-10-CM

## 2021-07-24 DIAGNOSIS — R6889 Other general symptoms and signs: Secondary | ICD-10-CM | POA: Diagnosis not present

## 2021-07-24 DIAGNOSIS — Z8673 Personal history of transient ischemic attack (TIA), and cerebral infarction without residual deficits: Secondary | ICD-10-CM | POA: Diagnosis not present

## 2021-07-24 DIAGNOSIS — R2681 Unsteadiness on feet: Secondary | ICD-10-CM | POA: Diagnosis not present

## 2021-07-24 DIAGNOSIS — R471 Dysarthria and anarthria: Secondary | ICD-10-CM | POA: Diagnosis present

## 2021-07-24 DIAGNOSIS — R2689 Other abnormalities of gait and mobility: Secondary | ICD-10-CM | POA: Diagnosis present

## 2021-07-24 DIAGNOSIS — R4701 Aphasia: Secondary | ICD-10-CM | POA: Insufficient documentation

## 2021-07-24 DIAGNOSIS — M6281 Muscle weakness (generalized): Secondary | ICD-10-CM

## 2021-07-24 DIAGNOSIS — G8929 Other chronic pain: Secondary | ICD-10-CM | POA: Insufficient documentation

## 2021-07-24 NOTE — Therapy (Signed)
?[facility] ?[place], [place], [address] ?Phone: [phone]   Fax:  [phone] ? ?Occupational Therapy Treatment ? ?Patient Details  ?Name: David Reeves ?MRN: [id number] ?Date of Birth: [birth year] ?Referring Provider (OT): [name] ? ? ?Encounter Date: 07/24/2021 ? ? OT End of Session - 07/24/21 1248   ? ? Visit Number 18   ? Number of Visits 24   ? Date for OT Re-Evaluation 08/15/21   ? Authorization Type Progress report period starting 06/22/2021   ? OT Start Time 1100   ? OT Stop Time 1145   ? OT Time Calculation (min) 45 min   ? Activity Tolerance Patient tolerated treatment well   ? Behavior During Therapy [facility] for tasks assessed/performed   ? ?  ?  ? ?  ? ? ?Past Medical History:  ?Diagnosis Date  ? Back pain 04/22/2012  ? Bone spur   ? Bulging disc 04/22/2012  ? Degenerative disc disease   ? Osteoarthritis   ? Panic anxiety syndrome   ? Stroke [facility]) 10/2020  ? Taking multiple medications for chronic disease   ? ? ?Past Surgical History:  ?Procedure Laterality Date  ? CYSTECTOMY    ? head  ? HERNIA REPAIR Left 2006,2014  ? [facility]  ? TOE SURGERY Right 2007  ? ? ?There were no vitals filed for this visit. ? ? Subjective Assessment - 07/24/21 1247   ? ? Subjective  Pt reports his goal is to ride his motorcycle again.   ? Patient is accompanied by: Family member   ? Pertinent History Pt. is a 51 y.o. who was diagnosed with a CVA on July 21st, 2022. Pt. completed several weeks of  inpatient rehab at [facility]. After returning to home, Pt. sustained a fall in december of 2022, and was admitted to the hospital with COVI-19, and back pain from the fall. Since the most recent discharge, Pt. has been residing with the ex-wife until the Pt. is ready to return to independent living. Pt. PMHx includes: Bilateral LBP with right sided sciatica, Lumbosacral spondylosis myelopathy, closed compression Fx of L1. Pt. enjoys cooking, and riding  motorcycles.   ? Currently in Pain? Yes   ? Pain Score 4    ? Pain Location Back   ? Pain Orientation Lower   ? Pain Descriptors / Indicators Aching   ? Pain Onset More than a month ago   ? ?  ?  ? ?  ? ?OT TREATMENT   ?  ?Neuro muscular re-education: ?  ? Pt. worked on right hand [facility] skills grasping, and manipulating medication bottle tops. Pt. worked on grasping, manipulating, and stacking [place] style discs.  ?  ?Therapeutic Exercise: ?  ?Pt. worked on [employer], and reciprocal motion using the UBE while seated for 8 min. with minimal resistance. Constant monitoring was provided, and an ACE wrap was utilized to secure his right hand onto the UBE handle. ?  ?Pt. reports that he received a referral from a neurosurgeon to see about back surgery. Pt. continues to progress with the RUE. Pt. Was able to grasp, screw, and unscrew thicker medication bottle tops, however has difficulty with securing a grasp on standard medication bottle tops. Pt. continues to require reps of alternating weightbearing, and proprioceptive input during the [facility] tasks to inhibit the tone in the right hand. Pt continues to have difficulty applying enough thumb, and finger pressure when grasping the objects as his fingers. Pt.  continues to work on improving RUE strength, and [facility] skills in order to work towards improving, and maximizing independence with ADLs, and IADL tasks. ? ? ? ? ? ? ? ? ? ? ? ? ? ? ? ? ? ? ? ? OT Education - 07/24/21 1248   ? ? Education Details RUE strengthening/functional use   ? Person(s) Educated Patient   ? Methods Explanation;Demonstration;Tactile cues;Verbal cues   ? Comprehension Verbalized understanding;Returned demonstration;Verbal cues required;Tactile cues required;Need further instruction   ? ?  ?  ? ?  ? ? ? OT Short Term Goals - 06/22/21 1147   ? ?  ? OT SHORT TERM GOAL #1  ? Title Pt. wil demonstrtae independence with HEPs   ? Baseline Eval: Pt. currently does not have one, 10th visit:  changes in  HEP ongoing   ? Time 6   ? Period Weeks   ? Status On-going   ? Target Date 07/04/21   ? ?  ?  ? ?  ? ? ? ? OT Long Term Goals - 06/22/21 1147   ? ?  ? OT LONG TERM GOAL #1  ? Title Pt. will improve RUE strength by 2 mm grades to assist with ADLs, and IADLs,   ? Baseline Eval: Right shoulder flexion, abduction 3/5, elbow flexion, extension wrist extension 3+/5, 10th visit: improving with RUE strength by not yet met goal   ? Time 12   ? Period Weeks   ? Status On-going   ? Target Date 08/15/21   ?  ? OT LONG TERM GOAL #2  ? Title Pt. will improve right grip by 10 lbs to prepare for firmly holding objects for IADLs.   ? Baseline Eval: R: 38#, L: 124# (In 3rd dynamometer slot)   ? Time 12   ? Period Weeks   ? Status On-going   ? Target Date 08/15/21   ?  ? OT LONG TERM GOAL #3  ? Title Pt. will improve right lateral pinch strength by 5# to assist with cutting food   ? Baseline Eval: R: 17, L: 25   ? Time 12   ? Period Weeks   ? Status On-going   ? Target Date 08/15/21   ?  ? OT LONG TERM GOAL #4  ? Title Pt. will improve Right 3pt pinch by 2# to be able to hold/open items for cooking   ? Baseline Eval: R: Pt. unable to engage thumb L: 29#   ? Time 12   ? Period Weeks   ? Status On-going   ? Target Date 08/15/21   ?  ? OT LONG TERM GOAL #5  ? Title Pt. will right hand [facility] skills to be able to independently manipulate buttons, and zippers.   ? Baseline Eval: Pt. has difficulty managing buttons,a nd zippers. 10th visit:  improving   ? Time 12   ? Period Weeks   ? Status On-going   ? Target Date 08/15/21   ?  ? OT LONG TERM GOAL #6  ? Title Pt. will independently write his name   ? Baseline Eval: Pt. is unable to hold a pen   ? Time 12   ? Period Weeks   ? Status On-going   ? Target Date 05/24/21   ?  ? OT LONG TERM GOAL #7  ? Title Pt. will improve FOTO score by 2 points to reflect funational improvement   ? Baseline Eval: FOT score 46 with TR score 52   ?  Time 12   ? Period Weeks   ? Status On-going   ? Target Date  08/15/21   ? ?  ?  ? ?  ? ? ? ? ? ? ? ? Plan - 07/24/21 1250   ? ? Clinical Impression Statement Pt. reports that he received a referral from a neurosurgeon to see about back surgery. Pt. continues to progress with the RUE. Pt. Was able to grasp, screw, and unscrew thicker medication bottle tops, however has difficulty with securing a grasp on standard medication bottle tops. Pt. continues to require reps of alternating weightbearing, and proprioceptive input during the [facility] tasks to inhibit the tone in the right hand. Pt continues to have difficulty applying enough thumb, and finger pressure when grasping the objects as his fingers. Pt. continues to work on improving RUE strength, and [facility] skills in order to work towards improving, and maximizing independence with ADLs, and IADL tasks.  ? OT Occupational Profile and History Detailed Assessment- Review of Records and additional review of physical, cognitive, psychosocial history related to current functional performance   ? Occupational performance deficits (Please refer to evaluation for details): ADL's;IADL's;Education   ? Rehab Potential Good   ? Clinical Decision Making Several treatment options, min-mod task modification necessary   ? Comorbidities Affecting Occupational Performance: May have comorbidities impacting occupational performance   ? Modification or Assistance to Complete Evaluation  Min-Moderate modification of tasks or assist with assess necessary to complete eval   ? OT Frequency 2x / week   ? OT Duration 12 weeks   ? OT Treatment/Interventions Self-care/ADL training;DME and/or AE instruction;Therapeutic exercise;Ultrasound;Neuromuscular education;Therapeutic activities;Energy conservation;Moist Heat;Patient/family education;Splinting;Functional Mobility Training;Paraffin   ? Consulted and Agree with Plan of Care Patient   ? ?  ?  ? ?  ? ? ?Patient will benefit from skilled therapeutic intervention in order to improve the following deficits and  impairments:   ?  ?  ?  ? ? ?Visit Diagnosis: ?Muscle weakness (generalized) ? ?Other lack of coordination ? ? ? ?Problem List ?Patient Active Problem List  ? Diagnosis Date Noted  ? Alterations of sensations following c

## 2021-07-24 NOTE — Therapy (Signed)
[place] ?[facility] ?[place], [place], [address] ?Phone: [phone]   Fax:  [phone] ? ?Physical Therapy Treatment ?Physical Therapy Progress Note ? ? ?Dates of reporting period  05/31/21   to   07/24/21 ? ? ?Patient Details  ?Name: David Reeves ?MRN: [id number] ?Date of Birth: Nov 20, 1970 ?Referring Provider (PT): [name] PA ? ? ?Encounter Date: 07/24/2021 ? ? PT End of Session - 07/24/21 1243   ? ? Visit Number 10   ? Number of Visits 17   ? Date for PT Re-Evaluation 08/21/21   ? PT Start Time 1146   ? PT Stop Time 1226   ? PT Time Calculation (min) 40 min   ? Equipment Utilized During Treatment Gait belt   ? Activity Tolerance Patient limited by pain   ? Behavior During Therapy [facility] for tasks assessed/performed   ? ?  ?  ? ?  ? ? ?Past Medical History:  ?Diagnosis Date  ? Back pain 04/22/2012  ? Bone spur   ? Bulging disc 04/22/2012  ? Degenerative disc disease   ? Osteoarthritis   ? Panic anxiety syndrome   ? Stroke [facility]) 10/2020  ? Taking multiple medications for chronic disease   ? ? ?Past Surgical History:  ?Procedure Laterality Date  ? CYSTECTOMY    ? head  ? HERNIA REPAIR Left 2006,2014  ? [facility]  ? TOE SURGERY Right 2007  ? ? ?There were no vitals filed for this visit. ? ? Subjective Assessment - 07/24/21 1147   ? ? Subjective Patient reports increased back pain today. "Its going up. When I got here it was a 3/10 now its going up to a 4/10"   ? Pertinent History Pt. is a 51 y.o. who was diagnosed with a CVA on July 21st, 2022. Pt. completed several weeks of  inpatient rehab at [facility]. After returning to home. He was discharged in late August/early September and recieved home health PT. Pt. sustained a fall in december of 2022, and was admitted to the hospital with COVID-19, and Chronic back pain. He reports chronic back pain syndrome since 2012. Since the most recent discharge, Pt. has been residing with the ex-wife until the Pt. is ready  to return to independent living. He did recieve 2 weeks of home health after discharge in December 2022. He is now being referred to outpatient PT to address weakness from stroke and improve fine motor movement. He reports rarely getting numbness/tingling in RLE. PMHx includes: Bilateral LBP with right sided sciatica, Lumbosacral spondylosis myelopathy, closed compression Fx of L1. Has chronic insomnia. osteoarthritis, Panic anxiety syndrome (taking medication) Pt. enjoys cooking, and riding motorcycles. Patient is going to [facility] spine center for Epidural spinal injections on 06/07/21;   ? Limitations Sitting;Walking   ? How long can you sit comfortably? 20 min;   ? How long can you stand comfortably? 5-10 min;   ? How long can you walk comfortably? able to walk long ways (>500 feet)   ? Diagnostic tests MRI Nov 2022- Stable chronic burst L1 fracture, progressive disc degeneration with disc bulging  eccentric to the left at L3-4 and L4-5. spinal stenosis at L5-S1;   ? Patient Stated Goals "I Need all the help  I can get. I want to move around better and reduce my back pain."   ? Currently in Pain? Yes   ? Pain Score 4    ? Pain Location Back   ? Pain  Orientation Lower   ? Pain Descriptors / Indicators Aching;Sore   ? Pain Type Chronic pain   ? Pain Radiating Towards low back   ? Pain Onset More than a month ago   ? Pain Frequency Constant   ? Aggravating Factors  prolonged sitting   ? Pain Relieving Factors heat/meds/e-stim   ? Effect of Pain on Daily Activities decreased sitting tolerance;   ? Multiple Pain Sites No   ? ?  ?  ? ?  ? ? ? ? ? [facility] PT Assessment - 07/24/21 0001   ? ?  ? Observation/Other Assessments  ? Focus on Therapeutic Outcomes (FOTO)  35%   ?  ? 6 minute walk test results   ? Aerobic Endurance Distance Walked 1115   ? Endurance additional comments without AD, no foot drag but does have to concentrate to heel strike with RLE; improved from 935 feet on 05/31/21   ?  ? Standardized Balance Assessment  ?  Five times sit to stand comments  17.65 sec with arms across chest, >15 sec indicates high risk for falls   ? ?  ?  ? ?  ? ?  ? ?Patient lying in supine with moist heat to low back.  ? Legs apart: Windshield wiper trunk rotation stretch x5 reps each; ?Hamstring stretch (BLE) x 30 sec x1 each leg.  ? Progressed to hamstring neural stretch with ankle DF/PF x10 reps each LE, moderate tightness noted on RLE;  ?Knee to chest (BLE) x20 sec x1 each leg ?Manual hip circles (CW/CCW) x 5 reps each direction and each LE ?LE crossed over contralateral LE for IT band stretch x5 sec hold x5 reps each LE;  ? ?  ?Patient reported feeling significantly better after stretches but pain is still 4/10;  ?  ?Instructed patient in outcome measures to address progress towards goals;  ?  ? Patient's condition has the potential to improve in response to therapy. Maximum improvement is yet to be obtained. The anticipated improvement is attainable and reasonable in a generally predictable time.  Patient reports mixed adherence with HEP, states that he has been focusing on UE strengthening as he is most concerned about getting UE functional mobility;  ? ?  ?  ? ? ? ? ? ? ? ? ? ? ? ? ? ? ? ? ? ? ? ? ? ? ? ? ? PT Short Term Goals - 07/24/21 1152   ? ?  ? PT SHORT TERM GOAL #1  ? Title Patient will be adherent to HEP at least 3x a week to improve functional strength and balance for better safety at home.   ? Baseline 4/4: doing them 1-2x a week   ? Time 4   ? Period Weeks   ? Status Partially Met   ? Target Date 08/21/21   ?  ? PT SHORT TERM GOAL #2  ? Title Patient (< 52 years old) will complete five times sit to stand test in < 15 seconds indicating an increased LE strength and improved balance.   ? Baseline 4/4: 17.85 sec with arms across chest   ? Time 4   ? Period Weeks   ? Status Partially Met   ? Target Date 08/21/21   ? ?  ?  ? ?  ? ? ? ? PT Long Term Goals - 07/24/21 1215   ? ?  ? PT LONG TERM GOAL #1  ? Title Patient will increase six  minute walk test  distance to >1000 for progression to community ambulator and improve gait ability   ? Baseline 4/4: 1115 feet   ? Time 4   ? Period Weeks   ? Status Achieved   ? Target Date 08/21/21   ?  ? PT LONG TERM GOAL #2  ? Title Patient will ascend/descend 8 stairs without rail assist independently without loss of balance to improve ability to get in/out of home.   ? Baseline 4/4: requires 1 rail assist   ? Time 4   ? Period Weeks   ? Status Partially Met   ? Target Date 08/21/21   ?  ? PT LONG TERM GOAL #3  ? Title Patient will increase BLE gross strength to 4+/5 as to improve functional strength for independent gait, increased standing tolerance and increased ADL ability.   ? Baseline 4/4: not formally assessed;   ? Time 4   ? Period Weeks   ? Status Partially Met   ? Target Date 08/21/21   ?  ? PT LONG TERM GOAL #4  ? Title Patient will improve FOTO score to >50% to indicate improved functional mobility with less pain with ADLs.   ? Baseline 4/4: 35%   ? Time 4   ? Period Weeks   ? Status Not Met   ? Target Date 08/21/21   ?  ? PT LONG TERM GOAL #5  ? Title Patient will report a worst pain of 4/10 in low back over last week to indicate improved tolerance with ADLs.   ? Baseline 4/4: 7/10   ? Time 4   ? Period Weeks   ? Status Not Met   ? Target Date 08/21/21   ? ?  ?  ? ?  ? ? ? ? ? ? ? ? Plan - 07/24/21 1244   ? ? Clinical Impression Statement Patient motivated and participated fair within session. He is still limited by back pain. Patient did get steroid injection with no relief. he did get a special electrical stimulation unit which has helped some. He reports feeling most relief when supine and therefore spends most of his time laying down at home. Patient does exhibit overall improved mobility with better 6 min walk distance and improved time with 5 times sit<>Stand. He continues to exhibit occasional RLE foot drag. Patient would benefit from additional skilled PT intervention to improve strength and  motor control of RLE for better gait mechanics.   ? Personal Factors and Comorbidities Comorbidity 3+;Past/Current Experience;Time since onset of injury/illness/exacerbation   ? Comorbidities PMHx inc

## 2021-07-25 ENCOUNTER — Ambulatory Visit: Payer: Medicare HMO | Admitting: Occupational Therapy

## 2021-07-25 NOTE — Patient Instructions (Signed)
Use pacing board in following sessions ?

## 2021-07-25 NOTE — Therapy (Signed)
[facility] ?[facility] ?[place], [place], [address] ?Phone: [phone]   Fax:  [phone] ? ?Speech Language Pathology Treatment ? ?Patient Details  ?Name: David Reeves ?MRN: [id number] ?Date of Birth: July 14, 1970 ?Referring Provider (SLP): [name] ? ? ?Encounter Date: 07/24/2021 ? ? End of Session - 07/25/21 1046   ? ? Visit Number 9   ? Number of Visits 25   ? Date for SLP Re-Evaluation 08/27/21   ? Authorization Type [employer] HMO   ? Authorization Time Period 06/04/2021 thru 08/27/2021   ? Authorization - Visit Number 9   ? Progress Note Due on Visit 10   ? SLP Start Time 1010   ? SLP Stop Time  1100   ? SLP Time Calculation (min) 50 min   ? Activity Tolerance Patient tolerated treatment well   ? ?  ?  ? ?  ? ? ?Past Medical History:  ?Diagnosis Date  ? Back pain 04/22/2012  ? Bone spur   ? Bulging disc 04/22/2012  ? Degenerative disc disease   ? Osteoarthritis   ? Panic anxiety syndrome   ? Stroke [facility]) 10/2020  ? Taking multiple medications for chronic disease   ? ? ?Past Surgical History:  ?Procedure Laterality Date  ? CYSTECTOMY    ? head  ? HERNIA REPAIR Left 2006,2014  ? [facility]  ? TOE SURGERY Right 2007  ? ? ?There were no vitals filed for this visit. ? ? Subjective Assessment - 07/25/21 1043   ? ? Subjective "I brought a video of Iris" - Iris is pt's bird   ? Currently in Pain? No/denies   ? ?  ?  ? ?  ? ? ? ? ? ? ? ? ADULT SLP TREATMENT - 07/25/21 0001   ? ?  ? Treatment Provided  ? Treatment provided Cognitive-Linquistic   ?  ? Cognitive-Linquistic Treatment  ? Treatment focused on Aphasia;Apraxia;Patient/family/caregiver education   ? Skilled Treatment Skilled treatment session focused on pt's speech intelligibility specifically his apraxia of speech.  ? ?SLP facilitated session by providing the following interventions:     ? ?Use of speech intelligibility strategies: SLP provided minimal cues to use pacing with dots on back  of pt's phone as pt tended to pace using the table top instead resulting in moderately dysfluent speech; when using pacing board pt's speech intelligibility improves from 50% to 75% at the sentence level   ? ?  ?  ? ?  ? ? ? SLP Education - 07/25/21 1046   ? ? Education Details provided   ? Person(s) Educated Patient   ? Methods Explanation;Demonstration;Verbal cues   ? Comprehension Verbalized understanding;Returned demonstration;Need further instruction   ? ?  ?  ? ?  ? ? ? SLP Short Term Goals - 06/04/21 1536   ? ?  ? SLP SHORT TERM GOAL #1  ? Title Pt will use the speech intelligibility strategy of pacing to slow rate of speech at the simple sentence level in 8 out of 10 opportunities.   ? Baseline < 25% speech intelligiblity d/t fast rate of speech   ? Time 10   ? Period --   sessions  ? Status New   ?  ? SLP SHORT TERM GOAL #2  ? Title Pt will produce fricatives in the initial, final and medial positions of words with 90% accuracy.   ? Baseline pt stops all fricatives   ? Time 10   ?  Period --   sessions  ? Status New   ?  ? SLP SHORT TERM GOAL #3  ? Title Pt will use speech intelligibility strategy of overarticulation to improve speech intelligibility to 75% at the simple sentence level.   ? Baseline < 25% speech intelligibili   ? Time 10   ? Period --   sessions  ? Status New   ?  ? SLP SHORT TERM GOAL #4  ? Title Pt will answer [place] questions after reading paragraph level passage with 75% accuracy.   ? Baseline new goal   ? Time 10   ? Period --   sessions  ? Status New   ? ?  ?  ? ?  ? ? ? SLP Long Term Goals - 06/04/21 1540   ? ?  ? SLP LONG TERM GOAL #1  ? Title Pt will use speech intelligibility stategies to achieve 50% speech intelligibility at the sentence level.   ? Baseline < 25% speech intelligibility   ? Time 12   ? Period Weeks   ? Status New   ? Target Date 08/27/21   ? ?  ?  ? ?  ? ? ? Plan - 07/25/21 1047   ? ? 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 During today's session, pt's speech intelligibility was more reduced d/t increased apraxia of speech. As a result, skilled ST intervention is required to instruct pt in speech intelligibility strategies to increase functional independence and reduce caregiver burden.   ? Speech Therapy Frequency 2x / week   ? Duration 12 weeks   ? Treatment/Interventions Language facilitation;Compensatory techniques;Cueing hierarchy;Internal/external aids;SLP instruction and feedback;Patient/family education;Compensatory strategies   ? Potential to Achieve Goals Good   ? SLP Home Exercise Plan provided, see pt instructions section   ? Consulted and Agree with Plan of Care Patient   ? ?  ?  ? ?  ? ? ?Patient will benefit from skilled therapeutic intervention in order to improve the following deficits and impairments:   ?Aphasia ? ?Apraxia ? ?H/O ischemic left MCA stroke ? ?H/O ischemic left ACA stroke ? ? ? ?Problem List ?Patient Active Problem List  ? Diagnosis Date Noted  ? Alterations of sensations following cerebrovascular accident 03/08/2021  ? Aphasia as late effect of cerebrovascular accident (CVA) 03/08/2021  ? Muscle spasm 02/22/2021  ? Acute non-recurrent frontal sinusitis 02/22/2021  ? Chronic pain syndrome 01/23/2021  ? Primary hypertension 01/23/2021  ? Nerve pain 01/23/2021  ? Erectile dysfunction due to diseases classified elsewhere 01/23/2021  ? Cognitive dysfunction 01/23/2021  ? Scrotal edema 01/23/2021  ? Elevated LDL cholesterol level 01/23/2021  ? History of stroke with residual deficit 01/23/2021  ? Primary insomnia 01/23/2021  ? Mouth pain 01/23/2021  ? Laceration of right hand without foreign body   ? Closed compression fracture of L1 vertebra ([place]) 05/24/2016  ? Lumbar stenosis with neurogenic claudication 11/01/2015  ? Chronic 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 11/08/2014  ? Hx of hemorrhoids 11/08/2014  ? AAA  (abdominal aortic aneurysm) ([place]) 09/22/2014  ? Chronic pain associated with significant psychosocial dysfunction 09/22/2014  ? Panic disorder 09/22/2014  ? AB (asthmatic bronchitis) 08/17/2014  ? Anxiety 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 08/17/2014  ? Backache 08/17/2014  ? Lumbosacral spondylosis without myelopathy 08/17/2014  ? Disorder of male genital organs 08/17/2014  ? Brash 08/17/2014  ? Low  back pain 08/17/2014  ? Tendon nodule 08/17/2014  ? Episodic paroxysmal anxiety disorder 08/17/2014  ? Hernia, inguinal, right 08/17/2014  ? Fast heart beat 08/17/2014  ? Illness 08/17/2014  ? Inguinal hernia 10/14/2012  ? ?Ivalene Platte B. Rutherford Nail, M.S., CCC-SLP, CBIS ?Speech-Language Pathologist ?Rehabilitation Services ?Office [phone] ? ?Isabel Ardila, CCC-SLP ?07/25/2021, 10:47 AM ? ?[place] ?[facility] ?[place], [place], [address] ?Phone: [phone]   Fax:  [phone] ? ? ?Name: David Reeves ?MRN: [id number] ?Date of Birth: 11/07/1970 ? ?

## 2021-07-27 ENCOUNTER — Other Ambulatory Visit: Payer: Self-pay | Admitting: Physician Assistant

## 2021-07-27 ENCOUNTER — Encounter: Payer: Self-pay | Admitting: Physical Therapy

## 2021-07-27 ENCOUNTER — Ambulatory Visit: Payer: Medicare HMO | Admitting: Physical Therapy

## 2021-07-27 ENCOUNTER — Ambulatory Visit: Payer: Medicare HMO | Admitting: Speech Pathology

## 2021-07-27 DIAGNOSIS — R269 Unspecified abnormalities of gait and mobility: Secondary | ICD-10-CM | POA: Diagnosis not present

## 2021-07-27 DIAGNOSIS — R482 Apraxia: Secondary | ICD-10-CM

## 2021-07-27 DIAGNOSIS — Z8673 Personal history of transient ischemic attack (TIA), and cerebral infarction without residual deficits: Secondary | ICD-10-CM | POA: Diagnosis not present

## 2021-07-27 DIAGNOSIS — R4701 Aphasia: Secondary | ICD-10-CM

## 2021-07-27 DIAGNOSIS — R278 Other lack of coordination: Secondary | ICD-10-CM | POA: Diagnosis not present

## 2021-07-27 DIAGNOSIS — M545 Low back pain, unspecified: Secondary | ICD-10-CM | POA: Diagnosis not present

## 2021-07-27 DIAGNOSIS — R2681 Unsteadiness on feet: Secondary | ICD-10-CM

## 2021-07-27 DIAGNOSIS — R6889 Other general symptoms and signs: Secondary | ICD-10-CM | POA: Diagnosis not present

## 2021-07-27 DIAGNOSIS — M6281 Muscle weakness (generalized): Secondary | ICD-10-CM | POA: Diagnosis not present

## 2021-07-27 DIAGNOSIS — R262 Difficulty in walking, not elsewhere classified: Secondary | ICD-10-CM

## 2021-07-27 DIAGNOSIS — G894 Chronic pain syndrome: Secondary | ICD-10-CM

## 2021-07-27 DIAGNOSIS — R2689 Other abnormalities of gait and mobility: Secondary | ICD-10-CM

## 2021-07-27 NOTE — Therapy (Signed)
[facility] ?[place], [place], [address] ?Phone: [phone]   Fax:  [phone] ? ?Physical Therapy Treatment ? ?Patient Details  ?Name: David Reeves ?MRN: [id number] ?Date of Birth: 1970-12-07 ?Referring Provider (PT): [name] PA ? ? ?Encounter Date: 07/27/2021 ? ? PT End of Session - 07/27/21 0827   ? ? Visit Number 11   ? Number of Visits 17   ? Date for PT Re-Evaluation 08/21/21   ? Progress Note Due on Visit 20   ? PT Start Time [phone]   ? PT Stop Time 0845   ? PT Time Calculation (min) 42 min   ? Equipment Utilized During Treatment Gait belt   ? Activity Tolerance Patient limited by pain   ? Behavior During Therapy [facility] for tasks assessed/performed   ? ?  ?  ? ?  ? ? ?Past Medical History:  ?Diagnosis Date  ? Back pain 04/22/2012  ? Bone spur   ? Bulging disc 04/22/2012  ? Degenerative disc disease   ? Osteoarthritis   ? Panic anxiety syndrome   ? Stroke [facility]) 10/2020  ? Taking multiple medications for chronic disease   ? ? ?Past Surgical History:  ?Procedure Laterality Date  ? CYSTECTOMY    ? head  ? HERNIA REPAIR Left 2006,2014  ? [facility]  ? TOE SURGERY Right 2007  ? ? ?There were no vitals filed for this visit. ? ? Subjective Assessment - 07/27/21 0807   ? ? Subjective Pt reports increased low back pain at this time. Reports no falls or loss of balance since last visit.   ? Pertinent History Pt. is a 51 y.o. who was diagnosed with a CVA on July 21st, 2022. Pt. completed several weeks of  inpatient rehab at [facility]. After returning to home. He was discharged in late August/early September and recieved home health PT. Pt. sustained a fall in december of 2022, and was admitted to the hospital with COVID-19, and Chronic back pain. He reports chronic back pain syndrome since 2012. Since the most recent discharge, Pt. has been residing with the ex-wife until the Pt. is ready to return to independent living. He did recieve 2 weeks of home  health after discharge in December 2022. He is now being referred to outpatient PT to address weakness from stroke and improve fine motor movement. He reports rarely getting numbness/tingling in RLE. PMHx includes: Bilateral LBP with right sided sciatica, Lumbosacral spondylosis myelopathy, closed compression Fx of L1. Has chronic insomnia. osteoarthritis, Panic anxiety syndrome (taking medication) Pt. enjoys cooking, and riding motorcycles. Patient is going to [facility] spine center for Epidural spinal injections on 06/07/21;   ? Limitations Sitting;Walking   ? How long can you sit comfortably? 20 min;   ? How long can you stand comfortably? 5-10 min;   ? How long can you walk comfortably? able to walk long ways (>500 feet)   ? Diagnostic tests MRI Nov 2022- Stable chronic burst L1 fracture, progressive disc degeneration with disc bulging  eccentric to the left at L3-4 and L4-5. spinal stenosis at L5-S1;   ? Patient Stated Goals "I Need all the help  I can get. I want to move around better and reduce my back pain."   ? Currently in Pain? Yes   ? Pain Score 5    ? Pain Location Back   ? Pain Orientation Lower   ? Pain Descriptors / Indicators Aching   ? Pain Onset  More than a month ago   ? ?  ?  ? ?  ? ? ? ? ?Manual therapy: Patient lying in supine with moist heat to low back.  ?  ?Hamstring stretch (BLE) x 30 sec x 4 each leg.  ?Knee to chest (BLE) x 30 sec x 4 each leg ?Manual hip circles (CW/CCW) x 10 reps each direction and each LE ?Pirifomis x 30 sec x 3 sets each LE ? ?  ?Patient reported pain improved from 5/10 down to 3/10 after stretching.  ?  ?Therex:  ?Lower trunk rotation- BLE x 5*5 sec holds on each side   ?Ball roll outs 5 x 5 sec holds  ? ?NMR: Unless otherwise stated, CGA was provided and gait belt donned in order to ensure pt safety ? ?step up with high march in // bars x 20 reps each leg ? ?Step taps x 10 ea, no UE support ? ?Side step up on airex and off x 10 each direction ? ?Semitandem on airex x 30  sec with head turns  ? ?Education provided throughout session via VC/TC and demonstration to facilitate movement at target joints and correct muscle activation for all testing and exercises performed.  ? ? ? ? ? ? ? ? ? ? ? ? ? ? ? ? ? ? ? ? ? ? ? ? ? ? ? ? ? ? ? ? PT Education - 07/27/21 1121   ? ? Education Details exercise technique   ? Person(s) Educated Patient   ? Methods Explanation;Demonstration   ? Comprehension Verbalized understanding;Returned demonstration   ? ?  ?  ? ?  ? ? ? PT Short Term Goals - 07/24/21 1152   ? ?  ? PT SHORT TERM GOAL #1  ? Title Patient will be adherent to HEP at least 3x a week to improve functional strength and balance for better safety at home.   ? Baseline 4/4: doing them 1-2x a week   ? Time 4   ? Period Weeks   ? Status Partially Met   ? Target Date 08/21/21   ?  ? PT SHORT TERM GOAL #2  ? Title Patient (< 20 years old) will complete five times sit to stand test in < 15 seconds indicating an increased LE strength and improved balance.   ? Baseline 4/4: 17.85 sec with arms across chest   ? Time 4   ? Period Weeks   ? Status Partially Met   ? Target Date 08/21/21   ? ?  ?  ? ?  ? ? ? ? PT Long Term Goals - 07/24/21 1215   ? ?  ? PT LONG TERM GOAL #1  ? Title Patient will increase six minute walk test distance to >1000 for progression to community ambulator and improve gait ability   ? Baseline 4/4: 1115 feet   ? Time 4   ? Period Weeks   ? Status Achieved   ? Target Date 08/21/21   ?  ? PT LONG TERM GOAL #2  ? Title Patient will ascend/descend 8 stairs without rail assist independently without loss of balance to improve ability to get in/out of home.   ? Baseline 4/4: requires 1 rail assist   ? Time 4   ? Period Weeks   ? Status Partially Met   ? Target Date 08/21/21   ?  ? PT LONG TERM GOAL #3  ? Title Patient will increase BLE gross strength to 4+/5 as  to improve functional strength for independent gait, increased standing tolerance and increased ADL ability.   ? Baseline  4/4: not formally assessed;   ? Time 4   ? Period Weeks   ? Status Partially Met   ? Target Date 08/21/21   ?  ? PT LONG TERM GOAL #4  ? Title Patient will improve FOTO score to >50% to indicate improved functional mobility with less pain with ADLs.   ? Baseline 4/4: 35%   ? Time 4   ? Period Weeks   ? Status Not Met   ? Target Date 08/21/21   ?  ? PT LONG TERM GOAL #5  ? Title Patient will report a worst pain of 4/10 in low back over last week to indicate improved tolerance with ADLs.   ? Baseline 4/4: 7/10   ? Time 4   ? Period Weeks   ? Status Not Met   ? Target Date 08/21/21   ? ?  ?  ? ?  ? ? ? ? ? ? ? ? Plan - 07/27/21 0828   ? ? Clinical Impression Statement Pt pleasant and demonstrated good motivation for completion of PT program. Pt had severe back pain secondary to recent weather fluctuations, but reports significantly improved also low back pain following manual therapy and stretches performed initially in physical therapy program.  Patient demonstrates good progressions with his balance based interventions did not report an increase in low back pain with any interventions performed this session.  Patient will continue to benefit from skilled physical therapy intervention in order to improve his balance, fall risk, as well as improve his levels of pain in his lower back.   ? Personal Factors and Comorbidities Comorbidity 3+;Past/Current Experience;Time since onset of injury/illness/exacerbation   ? Comorbidities PMHx includes: Bilateral LBP with right sided sciatica, Lumbosacral spondylosis myelopathy, closed compression Fx of L1. Has chronic insomnia. osteoarthritis, Panic anxiety syndrome (taking medication)   ? Examination-Activity Limitations Bend;Carry;Locomotion Level;Sit;Squat;Stairs;Stand;Transfers   ? Examination-Participation Restrictions Cleaning;Community Activity;Driving;Laundry;Meal Prep;Occupation;Shop;Volunteer;[employer] Work   ? Stability/Clinical Decision Making Stable/Uncomplicated   ? Rehab  Potential Good   ? PT Frequency 2x / week   ? PT Duration 4 weeks   ? PT Treatment/Interventions Cryotherapy;Electrical Stimulation;Moist Heat;Traction;Gait training;Stair training;Functional mobility trai

## 2021-07-28 NOTE — Therapy (Signed)
[facility] ?[place], [place], [address] ?Phone: [phone]   Fax:  [phone] ? ?Speech Language Pathology Treatment ?10th session progress note ? ?Patient Details  ?Name: David Reeves ?MRN: [id number] ?Date of Birth: 09/01/1970 ?Referring Provider (SLP): [name] ? ? ?Encounter Date: 07/27/2021 ? ? End of Session - 07/28/21 1829   ? ? Visit Number 10   ? Number of Visits 25   ? Date for SLP Re-Evaluation 08/27/21   ? Authorization Type [employer] HMO   ? Authorization Time Period 06/04/2021 thru 08/27/2021   ? Authorization - Visit Number 10   ? Progress Note Due on Visit 10   ? SLP Start Time 0900   ? SLP Stop Time  1000   ? SLP Time Calculation (min) 60 min   ? Activity Tolerance Patient tolerated treatment well   ? ?  ?  ? ?  ? ? ?Past Medical History:  ?Diagnosis Date  ? Back pain 04/22/2012  ? Bone spur   ? Bulging disc 04/22/2012  ? Degenerative disc disease   ? Osteoarthritis   ? Panic anxiety syndrome   ? Stroke [facility]) 10/2020  ? Taking multiple medications for chronic disease   ? ? ?Past Surgical History:  ?Procedure Laterality Date  ? CYSTECTOMY    ? head  ? HERNIA REPAIR Left 2006,2014  ? [facility]  ? TOE SURGERY Right 2007  ? ? ?There were no vitals filed for this visit. ? ? Subjective Assessment - 07/28/21 1228   ? ? Subjective pt pleasant, eager, motivated, took his cell-phone out to use pacing board prior to sitting down   ? Currently in Pain? No/denies   ? ?  ?  ? ?  ? ? ? ? ? ? ? ? ADULT SLP TREATMENT - 07/28/21 0001   ? ?  ? Treatment Provided  ? Treatment provided Cognitive-Linquistic   ?  ? Cognitive-Linquistic Treatment  ? Treatment focused on Aphasia;Apraxia;Patient/family/caregiver education   ? Skilled Treatment Skilled treatment session focused on pt's apraxia of speech, specifically his use of speech intelligibility strategies at the conversation level. SLP facilitated conversation by asking some inquiring  questions about pt's family etc. Pt required moderate cues to slow rate of speech to improve motor patterns. Despite cues, pt with tendency to repeat the entire phrase reducing listener understanding.   ? ?  ?  ? ?  ? ? ? SLP Education - 07/28/21 1829   ? ? Education Details provided   ? Person(s) Educated Patient   ? Methods Explanation;Demonstration;Verbal cues   ? Comprehension Verbalized understanding;Need further instruction   ? ?  ?  ? ?  ? ? ? SLP Short Term Goals - 07/28/21 1830   ? ?  ? SLP SHORT TERM GOAL #1  ? Title Pt will use the speech intelligibility strategy of pacing to slow rate of speech at the simple sentence level in 8 out of 10 opportunities.   ? Time 10   ? Period --   sessions  ? Status On-going   ?  ? SLP SHORT TERM GOAL #2  ? Title Pt will produce fricatives in the initial, final and medial positions of words with 90% accuracy.   ? Time 10   ? Period --   sessions  ? Status On-going   ?  ? SLP SHORT TERM GOAL #3  ? Title Pt will use speech intelligibility strategy of overarticulation to improve  speech intelligibility to 75% at the simple sentence level.   ? Time 10   ? Period --   sessions  ?  ? SLP SHORT TERM GOAL #4  ? Title Pt will answer [place] questions after reading paragraph level passage with 75% accuracy.   ? Time 10   ? Period --   sessions  ? Status On-going   ? ?  ?  ? ?  ? ? ? SLP Long Term Goals - 07/28/21 1831   ? ?  ? SLP LONG TERM GOAL #1  ? Title Pt will use speech intelligibility stategies to achieve 50% speech intelligibility at the sentence level.   ? Status On-going   ? Target Date 08/27/21   ? ?  ?  ? ?  ? ? ? Plan - 07/28/21 1829   ? ? 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 During today's session, pt's speech intelligibility was more reduced d/t increased apraxia of speech. As a result, skilled ST intervention is required to instruct pt in speech  intelligibility strategies to increase functional independence and reduce caregiver burden.   ? Speech Therapy Frequency 2x / week   ? Duration 12 weeks   ? Treatment/Interventions Language facilitation;Compensatory techniques;Cueing hierarchy;Internal/external aids;SLP instruction and feedback;Patient/family education;Compensatory strategies   ? Potential to Achieve Goals Good   ? Consulted and Agree with Plan of Care Patient   ? ?  ?  ? ?  ? ? ?Patient will benefit from skilled therapeutic intervention in order to improve the following deficits and impairments:   ?Aphasia ? ?Apraxia ? ?H/O ischemic left MCA stroke ? ?H/O ischemic left ACA stroke ? ? ? ?Problem List ?Patient Active Problem List  ? Diagnosis Date Noted  ? Alterations of sensations following cerebrovascular accident 03/08/2021  ? Aphasia as late effect of cerebrovascular accident (CVA) 03/08/2021  ? Muscle spasm 02/22/2021  ? Acute non-recurrent frontal sinusitis 02/22/2021  ? Chronic pain syndrome 01/23/2021  ? Primary hypertension 01/23/2021  ? Nerve pain 01/23/2021  ? Erectile dysfunction due to diseases classified elsewhere 01/23/2021  ? Cognitive dysfunction 01/23/2021  ? Scrotal edema 01/23/2021  ? Elevated LDL cholesterol level 01/23/2021  ? History of stroke with residual deficit 01/23/2021  ? Primary insomnia 01/23/2021  ? Mouth pain 01/23/2021  ? Laceration of right hand without foreign body   ? Closed compression fracture of L1 vertebra ([place] [facility]) 05/24/2016  ? Lumbar stenosis with neurogenic claudication 11/01/2015  ? Chronic 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 11/08/2014  ? Hx of hemorrhoids 11/08/2014  ? AAA (abdominal aortic aneurysm) ([place]) 09/22/2014  ? Chronic pain associated with significant psychosocial dysfunction 09/22/2014  ? Panic disorder 09/22/2014  ? AB (asthmatic bronchitis) 08/17/2014  ? Anxiety 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 08/17/2014  ? Backache 08/17/2014  ? Lumbosacral spondylosis without  myelopathy 08/17/2014  ? Disorder of male genital organs 08/17/2014  ? Brash 08/17/2014  ? Low back pain 08/17/2014  ? Tendon nodule 08/17/2014  ? Episodic paroxysmal anxiety disorder 08/17/2014  ? Hernia, inguinal, right 08/17/2014  ? Fast heart beat 08/17/2014  ? Illness 08/17/2014  ? Inguinal hernia 10/14/2012  ? ?Gracen Ringwald B. Rutherford Nail, M.S., CCC-SLP, CBIS ?Speech-Language Pathologist ?Rehabilitation Services ?Office [phone] ? ?[place] ?07/28/2021, 6:32 PM ? ?[facility] ?[place], [place], [address] ?Phone: [phone]   Fax:  [phone] ? ? ?  Name: David Reeves ?MRN: [id number] ?Date of Birth: 1970-09-16 ? ?

## 2021-07-30 ENCOUNTER — Ambulatory Visit: Payer: Medicare HMO | Admitting: Occupational Therapy

## 2021-07-30 NOTE — Telephone Encounter (Signed)
Requested medication (s) are due for refill today:  ? ?Requested medication (s) are on the active medication list: Yes ? ?Last refill:  06/05/21 ? ?Future visit scheduled: No ? ?Notes to clinic:  See request. ? ? ? ?Requested Prescriptions  ?Pending Prescriptions Disp Refills  ? tiZANidine (ZANAFLEX) 4 MG tablet [Pharmacy Med Name: TIZANIDINE HYDROCHLORIDE 4 MG Tablet] 360 tablet 0  ?  Sig: Take 1 tablet (4 mg total) by mouth in the morning, at noon, in the evening, and at bedtime.  ?  ? Not Delegated - Cardiovascular:  Alpha-2 Agonists - tizanidine Failed - 07/27/2021  7:44 PM  ?  ?  Failed - This refill cannot be delegated  ?  ?  Passed - Valid encounter within last 6 months  ?  Recent Outpatient Visits   ? ?      ? 2 months ago Alterations of sensations following cerebrovascular accident  ? [facility] Thedore Mins, Ria Comment, PA-C  ? 4 months ago Alterations of sensations following cerebrovascular accident  ? [place], PA-C  ? 5 months ago Muscle spasm  ? [facility] Tally Joe T, FNP  ? 6 months ago Nerve pain  ? [facility] Gwyneth Sprout, FNP  ? 7 months ago Cerebrovascular accident (CVA) due to occlusion of left middle cerebral artery ([place])  ? Christopher, PA-C  ? ?  ?  ?Future Appointments   ? ?        ? In 1 week [name], PA-C [facility]  ? ?  ? ?  ?  ?  ? ?

## 2021-07-31 ENCOUNTER — Ambulatory Visit: Payer: Medicare HMO | Admitting: Speech Pathology

## 2021-07-31 ENCOUNTER — Encounter: Payer: Self-pay | Admitting: Physical Therapy

## 2021-07-31 ENCOUNTER — Ambulatory Visit: Payer: Medicare HMO | Admitting: Physical Therapy

## 2021-07-31 ENCOUNTER — Ambulatory Visit: Payer: Medicare HMO | Admitting: Occupational Therapy

## 2021-07-31 DIAGNOSIS — R2689 Other abnormalities of gait and mobility: Secondary | ICD-10-CM

## 2021-07-31 DIAGNOSIS — R482 Apraxia: Secondary | ICD-10-CM | POA: Diagnosis not present

## 2021-07-31 DIAGNOSIS — Z8673 Personal history of transient ischemic attack (TIA), and cerebral infarction without residual deficits: Secondary | ICD-10-CM | POA: Diagnosis not present

## 2021-07-31 DIAGNOSIS — R278 Other lack of coordination: Secondary | ICD-10-CM | POA: Diagnosis not present

## 2021-07-31 DIAGNOSIS — R262 Difficulty in walking, not elsewhere classified: Secondary | ICD-10-CM

## 2021-07-31 DIAGNOSIS — R269 Unspecified abnormalities of gait and mobility: Secondary | ICD-10-CM

## 2021-07-31 DIAGNOSIS — M6281 Muscle weakness (generalized): Secondary | ICD-10-CM

## 2021-07-31 DIAGNOSIS — M545 Low back pain, unspecified: Secondary | ICD-10-CM | POA: Diagnosis not present

## 2021-07-31 DIAGNOSIS — R2681 Unsteadiness on feet: Secondary | ICD-10-CM | POA: Diagnosis not present

## 2021-07-31 DIAGNOSIS — R4701 Aphasia: Secondary | ICD-10-CM | POA: Diagnosis not present

## 2021-07-31 NOTE — Therapy (Signed)
[place] ?[facility] ?[place], [place], [address] ?Phone: [phone]   Fax:  [phone] ? ?Occupational Therapy Treatment ? ?Patient Details  ?Name: David Reeves ?MRN: [id number] ?Date of Birth: January 28, 1971 ?Referring Provider (OT): [name] ? ? ?Encounter Date: 07/31/2021 ? ? OT End of Session - 07/31/21 1220   ? ? Visit Number 19   ? Number of Visits 24   ? Date for OT Re-Evaluation 08/15/21   ? Authorization Type Progress report period starting 06/22/2021   ? OT Start Time 1100   ? OT Stop Time 1145   ? OT Time Calculation (min) 45 min   ? Activity Tolerance Patient tolerated treatment well   ? ?  ?  ? ?  ? ? ?Past Medical History:  ?Diagnosis Date  ? Back pain 04/22/2012  ? Bone spur   ? Bulging disc 04/22/2012  ? Degenerative disc disease   ? Osteoarthritis   ? Panic anxiety syndrome   ? Stroke [facility]) 10/2020  ? Taking multiple medications for chronic disease   ? ? ?Past Surgical History:  ?Procedure Laterality Date  ? CYSTECTOMY    ? head  ? HERNIA REPAIR Left 2006,2014  ? [facility]  ? TOE SURGERY Right 2007  ? ? ?There were no vitals filed for this visit. ? ? Subjective Assessment - 07/31/21 1220   ? ? Subjective  Pt reports his goal is to ride his motorcycle again.   ? Patient is accompanied by: Family member   ? Pertinent History Pt. is a 51 y.o. who was diagnosed with a CVA on July 21st, 2022. Pt. completed several weeks of  inpatient rehab at [facility]. After returning to home, Pt. sustained a fall in december of 2022, and was admitted to the hospital with COVI-19, and back pain from the fall. Since the most recent discharge, Pt. has been residing with the ex-wife until the Pt. is ready to return to independent living. Pt. PMHx includes: Bilateral LBP with right sided sciatica, Lumbosacral spondylosis myelopathy, closed compression Fx of L1. Pt. enjoys cooking, and riding motorcycles.   ? Patient Stated Goals Pt would like to be as  independent as he was before, improve hand function.   ? Currently in Pain? Yes   ? Pain Score 4    ? Pain Location Back   ? Pain Orientation Lower   ? Pain Descriptors / Indicators Aching;Sore   ? Pain Type Chronic pain   ? Pain Onset More than a month ago   ? ?  ?  ? ?  ? ?OT TREATMENT   ?  ?Neuro muscular re-education: ?  ?Pt. worked on right hand [facility] skills grasping 1" cubes, and working on controlled release of the cubes. Kinesio tape was applied to the right thumb for thumb IP extension. Pt. Worked on folding wash cloths at the tabletop. ?  ?Therapeutic Exercise: ?  ?Pt. worked on [employer], and reciprocal motion using the UBE while seated for 8 min. with minimal resistance. Constant monitoring was provided, and an ACE wrap was utilized to secure his right hand onto the UBE handle. ?  ?Pt. reports that he has a referral from a neurosurgeon, but has been waiting for his appointment to be scheduled. Pt. continues to progress with the RUE. Pt. Was able to consistently secure grasp pattern without the thumb IP joint flexing when the Kinesio tape is in place for thumb IP extension. Pt. was able to stabilize  the PenAgain in his right hand to initiate formulating letters. Pt. continues to work on improving RUE strength, and [facility] skills in order to work towards improving, and maximizing independence with ADLs, and IADL tasks. ?  ? ? ? ? ? ? ? ? ? ? ? ? ? ? ? ? ? ? ? ? ? OT Education - 07/31/21 1220   ? ? Education Details RUE strengthening/functional use   ? Person(s) Educated Patient   ? Methods Explanation;Demonstration;Tactile cues;Verbal cues   ? Comprehension Verbalized understanding;Returned demonstration;Verbal cues required;Tactile cues required;Need further instruction   ? ?  ?  ? ?  ? ? ? OT Short Term Goals - 06/22/21 1147   ? ?  ? OT SHORT TERM GOAL #1  ? Title Pt. wil demonstrtae independence with HEPs   ? Baseline Eval: Pt. currently does not have one, 10th visit:  changes in HEP ongoing   ? Time  6   ? Period Weeks   ? Status On-going   ? Target Date 07/04/21   ? ?  ?  ? ?  ? ? ? ? OT Long Term Goals - 06/22/21 1147   ? ?  ? OT LONG TERM GOAL #1  ? Title Pt. will improve RUE strength by 2 mm grades to assist with ADLs, and IADLs,   ? Baseline Eval: Right shoulder flexion, abduction 3/5, elbow flexion, extension wrist extension 3+/5, 10th visit: improving with RUE strength by not yet met goal   ? Time 12   ? Period Weeks   ? Status On-going   ? Target Date 08/15/21   ?  ? OT LONG TERM GOAL #2  ? Title Pt. will improve right grip by 10 lbs to prepare for firmly holding objects for IADLs.   ? Baseline Eval: R: 38#, L: 124# (In 3rd dynamometer slot)   ? Time 12   ? Period Weeks   ? Status On-going   ? Target Date 08/15/21   ?  ? OT LONG TERM GOAL #3  ? Title Pt. will improve right lateral pinch strength by 5# to assist with cutting food   ? Baseline Eval: R: 17, L: 25   ? Time 12   ? Period Weeks   ? Status On-going   ? Target Date 08/15/21   ?  ? OT LONG TERM GOAL #4  ? Title Pt. will improve Right 3pt pinch by 2# to be able to hold/open items for cooking   ? Baseline Eval: R: Pt. unable to engage thumb L: 29#   ? Time 12   ? Period Weeks   ? Status On-going   ? Target Date 08/15/21   ?  ? OT LONG TERM GOAL #5  ? Title Pt. will right hand [facility] skills to be able to independently manipulate buttons, and zippers.   ? Baseline Eval: Pt. has difficulty managing buttons,a nd zippers. 10th visit:  improving   ? Time 12   ? Period Weeks   ? Status On-going   ? Target Date 08/15/21   ?  ? OT LONG TERM GOAL #6  ? Title Pt. will independently write his name   ? Baseline Eval: Pt. is unable to hold a pen   ? Time 12   ? Period Weeks   ? Status On-going   ? Target Date 05/24/21   ?  ? OT LONG TERM GOAL #7  ? Title Pt. will improve FOTO score by 2 points to reflect funational  improvement   ? Baseline Eval: FOT score 46 with TR score 52   ? Time 12   ? Period Weeks   ? Status On-going   ? Target Date 08/15/21   ? ?  ?  ? ?   ? ? ? ? ? ? ? ? Plan - 07/31/21 1221   ? ? Clinical Impression Statement Pt. reports that he has a referral from a neurosurgeon, but has been waiting for his appointment to be scheduled. Pt. continues to progress with the RUE. Pt. Was able to consistently secure grasp pattern without the thumb IP joint flexing when the Kinesio tape is in place for thumb IP extension. Pt. was able to stabilize the PenAgain in his right hand to initiate formulating letters. Pt. continues to work on improving RUE strength, and [facility] skills in order to work towards improving, and maximizing independence with ADLs, and IADL tasks. ?   ? OT Occupational Profile and History Detailed Assessment- Review of Records and additional review of physical, cognitive, psychosocial history related to current functional performance   ? Occupational performance deficits (Please refer to evaluation for details): ADL's;IADL's;Education   ? Rehab Potential Good   ? Clinical Decision Making Several treatment options, min-mod task modification necessary   ? Comorbidities Affecting Occupational Performance: May have comorbidities impacting occupational performance   ? Modification or Assistance to Complete Evaluation  Min-Moderate modification of tasks or assist with assess necessary to complete eval   ? OT Frequency 2x / week   ? OT Duration 12 weeks   ? OT Treatment/Interventions Self-care/ADL training;DME and/or AE instruction;Therapeutic exercise;Ultrasound;Neuromuscular education;Therapeutic activities;Energy conservation;Moist Heat;Patient/family education;Splinting;Functional Mobility Training;Paraffin   ? Consulted and Agree with Plan of Care Patient   ? ?  ?  ? ?  ? ? ?Patient will benefit from skilled therapeutic intervention in order to improve the following deficits and impairments:   ?  ?  ?  ? ? ?Visit Diagnosis: ?Muscle weakness (generalized) ? ?Other lack of coordination ? ? ? ?Problem List ?Patient Active Problem List  ? Diagnosis Date Noted   ? Alterations of sensations following cerebrovascular accident 03/08/2021  ? Aphasia as late effect of cerebrovascular accident (CVA) 03/08/2021  ? Muscle spasm 02/22/2021  ? Acute non-recurrent frontal sinusitis

## 2021-07-31 NOTE — Therapy (Signed)
[place] ?[facility] ?[place], [place], [address] ?Phone: [phone]   Fax:  [phone] ? ?Physical Therapy Treatment ? ?Patient Details  ?Name: David Reeves ?MRN: [id number] ?Date of Birth: 05-23-1970 ?Referring Provider (PT): [name] PA ? ? ?Encounter Date: 07/31/2021 ? ? PT End of Session - 07/31/21 1452   ? ? Visit Number 12   ? Number of Visits 17   ? Date for PT Re-Evaluation 08/21/21   ? Authorization Type authorized 16 visits 4/4-6/1   ? Authorization - Visit Number 2   ? Authorization - Number of Visits 16   ? Progress Note Due on Visit 20   ? PT Start Time 1146   ? PT Stop Time 1228   ? PT Time Calculation (min) 42 min   ? Equipment Utilized During Treatment Gait belt   ? Activity Tolerance Patient limited by pain   ? Behavior During Therapy [facility] for tasks assessed/performed   ? ?  ?  ? ?  ? ? ?Past Medical History:  ?Diagnosis Date  ? Back pain 04/22/2012  ? Bone spur   ? Bulging disc 04/22/2012  ? Degenerative disc disease   ? Osteoarthritis   ? Panic anxiety syndrome   ? Stroke [facility]) 10/2020  ? Taking multiple medications for chronic disease   ? ? ?Past Surgical History:  ?Procedure Laterality Date  ? CYSTECTOMY    ? head  ? HERNIA REPAIR Left 2006,2014  ? [facility]  ? TOE SURGERY Right 2007  ? ? ?There were no vitals filed for this visit. ? ? Subjective Assessment - 07/31/21 1153   ? ? Subjective Patient reports continued low back pain. He denies any new falls; Pt reports he has a driving stimulation on 4/27 at [facility]   ? Pertinent History Pt. is a 51 y.o. who was diagnosed with a CVA on July 21st, 2022. Pt. completed several weeks of  inpatient rehab at [facility]. After returning to home. He was discharged in late August/early September and recieved home health PT. Pt. sustained a fall in december of 2022, and was admitted to the hospital with COVID-19, and Chronic back pain. He reports chronic back pain syndrome since 2012. Since the most  recent discharge, Pt. has been residing with the ex-wife until the Pt. is ready to return to independent living. He did recieve 2 weeks of home health after discharge in December 2022. He is now being referred to outpatient PT to address weakness from stroke and improve fine motor movement. He reports rarely getting numbness/tingling in RLE. PMHx includes: Bilateral LBP with right sided sciatica, Lumbosacral spondylosis myelopathy, closed compression Fx of L1. Has chronic insomnia. osteoarthritis, Panic anxiety syndrome (taking medication) Pt. enjoys cooking, and riding motorcycles. Patient is going to [facility] for Epidural spinal injections on 06/07/21;   ? Limitations Sitting;Walking   ? How long can you sit comfortably? 20 min;   ? How long can you stand comfortably? 5-10 min;   ? How long can you walk comfortably? able to walk long ways (>500 feet)   ? Diagnostic tests MRI Nov 2022- Stable chronic burst L1 fracture, progressive disc degeneration with disc bulging  eccentric to the left at L3-4 and L4-5. spinal stenosis at L5-S1;   ? Patient Stated Goals "I Need all the help  I can get. I want to move around better and reduce my back pain."   ? Currently in Pain? Yes   ?  Pain Score 4    ? Pain Location Back   ? Pain Orientation Lower   ? Pain Descriptors / Indicators Aching;Sore   ? Pain Type Chronic pain   ? Pain Onset More than a month ago   ? Pain Frequency Constant   ? Aggravating Factors  prolonged sitting   ? Pain Relieving Factors heat/meds/e-stim   ? Effect of Pain on Daily Activities decreased sitting tolerance;   ? Multiple Pain Sites No   ? ?  ?  ? ?  ? ? ? ? ? ? ? ?TREATMENT: ?Patient lying in supine with moist heat to low back.  ? Legs apart: Windshield wiper trunk rotation stretch x5 reps each x2 sets ?Hamstring stretch (BLE) x 30 sec x1 each leg.  ?            Progressed to hamstring neural stretch with ankle DF/PF 2x10 reps each LE, moderate tightness noted on RLE;  ?Knee to chest (BLE) x20  sec x1 each leg ?Manual hip circles (CW/CCW) x 5 reps each direction and each LE ?LE crossed over contralateral LE for IT band stretch x5 sec hold x5 reps each LE;  ?Patient reported feeling significantly better after stretches but pain is still 4/10;  ? ?Hooklying: ?SLR hip flexion 2# x15 reps RLE ?Suspended heel slides 2# x10 reps RLE only; ?RLE ankle DF green tband x20 reps with therapist holding tband for resistance;  ? ?Patient reports increased stiffness upon standing up. ?He required assistance for donning back brace due to UE hemiparesis ? ? ?NMR: ?Patient instructed in advanced balance tasks, utilizing balance machine (Kore balance) ?Patient standing on center of surface with 3.0 stability ?Instructed patient in scale weight shift, 30 sec bouts x4 sets with moderate difficulty shifting to RLE with heavy LLE weight shift and intermittent rail assist. Required CGA to min A for safety and cues for proper positioning; ? ?Instructed patient in maze exercise, patient able to complete 2 levels requiring increased time/effort due to poor right weight shift. He also exhibits increased difficulty shifting weight posteriorly requiring rail assist ? ?Patient required min A with most exercise for balance control. He reports increased fatigue but expressed having fun with exercise. He agrees that he often shifts weight to LLE due to weakness and numbness in RLE; ? ? ? ? ? ? ? ? ? ? ? ? ? ? ? ? ? ? PT Education - 07/31/21 1452   ? ? Education Details exercise technique/weight shift   ? Person(s) Educated Patient   ? Methods Explanation;Verbal cues   ? Comprehension Verbalized understanding;Returned demonstration;Verbal cues required;Need further instruction   ? ?  ?  ? ?  ? ? ? PT Short Term Goals - 07/24/21 1152   ? ?  ? PT SHORT TERM GOAL #1  ? Title Patient will be adherent to HEP at least 3x a week to improve functional strength and balance for better safety at home.   ? Baseline 4/4: doing them 1-2x a week   ? Time 4    ? Period Weeks   ? Status Partially Met   ? Target Date 08/21/21   ?  ? PT SHORT TERM GOAL #2  ? Title Patient (< 34 years old) will complete five times sit to stand test in < 15 seconds indicating an increased LE strength and improved balance.   ? Baseline 4/4: 17.85 sec with arms across chest   ? Time 4   ? Period Weeks   ?  Status Partially Met   ? Target Date 08/21/21   ? ?  ?  ? ?  ? ? ? ? PT Long Term Goals - 07/24/21 1215   ? ?  ? PT LONG TERM GOAL #1  ? Title Patient will increase six minute walk test distance to >1000 for progression to community ambulator and improve gait ability   ? Baseline 4/4: 1115 feet   ? Time 4   ? Period Weeks   ? Status Achieved   ? Target Date 08/21/21   ?  ? PT LONG TERM GOAL #2  ? Title Patient will ascend/descend 8 stairs without rail assist independently without loss of balance to improve ability to get in/out of home.   ? Baseline 4/4: requires 1 rail assist   ? Time 4   ? Period Weeks   ? Status Partially Met   ? Target Date 08/21/21   ?  ? PT LONG TERM GOAL #3  ? Title Patient will increase BLE gross strength to 4+/5 as to improve functional strength for independent gait, increased standing tolerance and increased ADL ability.   ? Baseline 4/4: not formally assessed;   ? Time 4   ? Period Weeks   ? Status Partially Met   ? Target Date 08/21/21   ?  ? PT LONG TERM GOAL #4  ? Title Patient will improve FOTO score to >50% to indicate improved functional mobility with less pain with ADLs.   ? Baseline 4/4: 35%   ? Time 4   ? Period Weeks   ? Status Not Met   ? Target Date 08/21/21   ?  ? PT LONG TERM GOAL #5  ? Title Patient will report a worst pain of 4/10 in low back over last week to indicate improved tolerance with ADLs.   ? Baseline 4/4: 7/10   ? Time 4   ? Period Weeks   ? Status Not Met   ? Target Date 08/21/21   ? ?  ?  ? ?  ? ? ? ? ? ? ? ? Plan - 07/31/21 1453   ? ? Clinical Impression Statement Patient motivated and participated well within session. He continues to  have high levels of back pain which is alleviated with supine position and moist heat. PT instructed patient in LE stretches as well as LE strengthening. He does report moderate difficulty with RLE

## 2021-08-01 ENCOUNTER — Ambulatory Visit: Payer: Medicare HMO | Admitting: Occupational Therapy

## 2021-08-01 NOTE — Therapy (Signed)
[place] ?[facility] ?[address] ?[place], [place], [address] ?Phone: [phone]   Fax:  [phone] ? ?Speech Language Pathology Treatment ? ?Patient Details  ?Name: David Reeves ?MRN: [id number] ?Date of Birth: 1970-10-22 ?Referring Provider (SLP): Alfredia Ferguson ? ? ?Encounter Date: 07/31/2021 ? ? End of Session - 08/01/21 1237   ? ? Visit Number 11   ? Number of Visits 25   ? Date for SLP Re-Evaluation 08/27/21   ? Authorization Type [employer] HMO   ? Authorization Time Period 06/04/2021 thru 08/27/2021   ? Authorization - Visit Number 1   ? Progress Note Due on Visit 10   ? SLP Start Time 1000   ? SLP Stop Time  1100   ? SLP Time Calculation (min) 60 min   ? Activity Tolerance Patient tolerated treatment well   ? ?  ?  ? ?  ? ? ?Past Medical History:  ?Diagnosis Date  ? Back pain 04/22/2012  ? Bone spur   ? Bulging disc 04/22/2012  ? Degenerative disc disease   ? Osteoarthritis   ? Panic anxiety syndrome   ? Stroke [facility]) 10/2020  ? Taking multiple medications for chronic disease   ? ? ?Past Surgical History:  ?Procedure Laterality Date  ? CYSTECTOMY    ? head  ? HERNIA REPAIR Left 2006,2014  ? [facility]  ? TOE SURGERY Right 2007  ? ? ?There were no vitals filed for this visit. ? ? Subjective Assessment - 08/01/21 1234   ? ? Subjective pt pleasant, eager, motivated, took his cell-phone out to use pacing board prior to sitting down   ? Currently in Pain? No/denies   ? ?  ?  ? ?  ? ? ? ? ? ? ? ? ADULT SLP TREATMENT - 08/01/21 0001   ? ?  ? Treatment Provided  ? Treatment provided Cognitive-Linquistic   ?  ? Cognitive-Linquistic Treatment  ? Treatment focused on Aphasia;Apraxia;Patient/family/caregiver education   ? Skilled Treatment Skilled treatment session focused on pt's speech intelligibility specifically his speech intelligibility.  ? ?SLP facilitated session by providing the following interventions:     ? ?Use of speech intelligibility strategies: SLP  provided increased cognitive load by introducing and instructing pt very complex novel card game. Pt with Mod I when using his pacing board to ask multiple questions throughout game. As a result, his speech intelligibility was > 90% at the structured sentence level.   ? ?  ?  ? ?  ? ? ? SLP Education - 08/01/21 1237   ? ? Education Details funcitonal speech intelligibility   ? Person(s) Educated Patient   ? Methods Explanation;Demonstration;Verbal cues   ? Comprehension Verbalized understanding;Need further instruction;Returned demonstration   ? ?  ?  ? ?  ? ? ? SLP Short Term Goals - 07/28/21 1830   ? ?  ? SLP SHORT TERM GOAL #1  ? Title Pt will use the speech intelligibility strategy of pacing to slow rate of speech at the simple sentence level in 8 out of 10 opportunities.   ? Time 10   ? Period --   sessions  ? Status On-going   ?  ? SLP SHORT TERM GOAL #2  ? Title Pt will produce fricatives in the initial, final and medial positions of words with 90% accuracy.   ? Time 10   ? Period --   sessions  ? Status On-going   ?  ? SLP  SHORT TERM GOAL #3  ? Title Pt will use speech intelligibility strategy of overarticulation to improve speech intelligibility to 75% at the simple sentence level.   ? Time 10   ? Period --   sessions  ?  ? SLP SHORT TERM GOAL #4  ? Title Pt will answer WH questions after reading paragraph level passage with 75% accuracy.   ? Time 10   ? Period --   sessions  ? Status On-going   ? ?  ?  ? ?  ? ? ? SLP Long Term Goals - 07/28/21 1831   ? ?  ? SLP LONG TERM GOAL #1  ? Title Pt will use speech intelligibility stategies to achieve 50% speech intelligibility at the sentence level.   ? Status On-going   ? Target Date 08/27/21   ? ?  ?  ? ?  ? ? ? Plan - 08/01/21 1237   ? ? 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 As a result, skilled ST intervention is required to instruct pt in  speech intelligibility strategies to increase functional independence and reduce caregiver burden.   ? Speech Therapy Frequency 2x / week   ? Duration 12 weeks   ? Treatment/Interventions Language facilitation;Compensatory techniques;Cueing hierarchy;Internal/external aids;SLP instruction and feedback;Patient/family education;Compensatory strategies   ? Potential to Achieve Goals Good   ? SLP Home Exercise Plan provided, see pt instructions section   ? Consulted and Agree with Plan of Care Patient   ? ?  ?  ? ?  ? ? ?Patient will benefit from skilled therapeutic intervention in order to improve the following deficits and impairments:   ?Apraxia ? ?H/O ischemic left MCA stroke ? ?H/O ischemic left ACA stroke ? ? ? ?Problem List ?Patient Active Problem List  ? Diagnosis Date Noted  ? Alterations of sensations following cerebrovascular accident 03/08/2021  ? Aphasia as late effect of cerebrovascular accident (CVA) 03/08/2021  ? Muscle spasm 02/22/2021  ? Acute non-recurrent frontal sinusitis 02/22/2021  ? Chronic pain syndrome 01/23/2021  ? Primary hypertension 01/23/2021  ? Nerve pain 01/23/2021  ? Erectile dysfunction due to diseases classified elsewhere 01/23/2021  ? Cognitive dysfunction 01/23/2021  ? Scrotal edema 01/23/2021  ? Elevated LDL cholesterol level 01/23/2021  ? History of stroke with residual deficit 01/23/2021  ? Primary insomnia 01/23/2021  ? Mouth pain 01/23/2021  ? Laceration of right hand without foreign body   ? Closed compression fracture of L1 vertebra (HCC) 05/24/2016  ? Lumbar stenosis with neurogenic claudication 11/01/2015  ? Chronic 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 11/08/2014  ? Hx of hemorrhoids 11/08/2014  ? AAA (abdominal aortic aneurysm) (HCC) 09/22/2014  ? Chronic pain associated with significant psychosocial dysfunction 09/22/2014  ? Panic disorder 09/22/2014  ? AB (asthmatic bronchitis) 08/17/2014  ? Anxiety 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 08/17/2014  ?  Backache 08/17/2014  ? Lumbosacral spondylosis without myelopathy 08/17/2014  ? Disorder of male genital organs 08/17/2014  ? Brash 08/17/2014  ? Low back pain 08/17/2014  ? Tendon nodule 08/17/2014  ? Episodic paroxysmal anxiety disorder 08/17/2014  ? Hernia, inguinal, right 08/17/2014  ? Fast heart beat 08/17/2014  ? Illness 08/17/2014  ? Inguinal hernia 10/14/2012  ? ?Deyton Ellenbecker B. Dreama Saaverton, M.S., CCC-SLP, CBIS ?Speech-Language Pathologist ?Rehabilitation Services ?Office [phone] ? ?Dwight Burdo Dreama SaaOverton, CCC-SLP ?08/01/2021, 12:38 PM ? ?[place] ?[facility] ?[phone] ?[place], [place], [address] ?Phone: [phone]   Fax:  [phone] ? ? ?Name: Suvan Stcyr ?MRN: [id number] ?Date of Birth: 1970-11-21 ? ?

## 2021-08-03 ENCOUNTER — Other Ambulatory Visit: Payer: Self-pay | Admitting: Physician Assistant

## 2021-08-03 ENCOUNTER — Ambulatory Visit: Payer: Medicare HMO

## 2021-08-03 ENCOUNTER — Ambulatory Visit: Payer: Medicare HMO | Admitting: Physician Assistant

## 2021-08-03 ENCOUNTER — Ambulatory Visit: Payer: Medicare HMO | Admitting: Speech Pathology

## 2021-08-03 DIAGNOSIS — S32010S Wedge compression fracture of first lumbar vertebra, sequela: Secondary | ICD-10-CM

## 2021-08-03 DIAGNOSIS — G8929 Other chronic pain: Secondary | ICD-10-CM

## 2021-08-03 DIAGNOSIS — M47817 Spondylosis without myelopathy or radiculopathy, lumbosacral region: Secondary | ICD-10-CM

## 2021-08-03 NOTE — Telephone Encounter (Signed)
Requested medication (s) are due for refill today:   prescribed today ? ?Requested medication (s) are on the active medication list:   Yes ? ?Future visit scheduled:   Yes ? ? ?Last ordered: today 08/03/2021 #60, 0 refills ? ?Non delegated refill   Duplicate request sent  ? ?Requested Prescriptions  ?Pending Prescriptions Disp Refills  ? diazepam (VALIUM) 5 MG tablet 60 tablet 0  ?  Sig: TAKE 1 TABLET (5 MG TOTAL) BY MOUTH EVERY 12 HOURS AS NEEDED FOR MUSCLE SPASM  ?  ? Not Delegated - Psychiatry: Anxiolytics/Hypnotics 2 Failed - 08/03/2021  9:02 AM  ?  ?  Failed - This refill cannot be delegated  ?  ?  Failed - Urine Drug Screen completed in last 360 days  ?  ?  Passed - Patient is not pregnant  ?  ?  Passed - Valid encounter within last 6 months  ?  Recent Outpatient Visits   ? ?      ? 3 months ago Alterations of sensations following cerebrovascular accident  ? [facility] Ok Edwards, Lillia Abed, PA-C  ? 4 months ago Alterations of sensations following cerebrovascular accident  ? [facility] Ok Edwards, Lillia Abed, PA-C  ? 5 months ago Muscle spasm  ? [facility] Merita Norton T, FNP  ? 6 months ago Nerve pain  ? [facility] Jacky Kindle, FNP  ? 7 months ago Cerebrovascular accident (CVA) due to occlusion of left middle cerebral artery (HCC)  ? [facility] Chrismon, Jodell Cipro, PA-C  ? ?  ?  ?Future Appointments   ? ?        ? In 4 days Alfredia Ferguson, PA-C [facility]  ? ?  ? ? ?  ?  ?  ? ?

## 2021-08-03 NOTE — Telephone Encounter (Signed)
Requested medication (s) are due for refill today: yes signed today ? ?Requested medication (s) are on the active medication list: yes ? ?Last refill:  07/05/21  #60 0 refills ? ?Future visit scheduled: yes in 4 days  ? ?Notes to clinic:  not delegated per protocol ? ? ?  ?Requested Prescriptions  ?Pending Prescriptions Disp Refills  ? diazepam (VALIUM) 5 MG tablet [Pharmacy Med Name: DIAZEPAM 5 MG TABLET] 60 tablet 0  ?  Sig: TAKE 1 TABLET (5 MG TOTAL) BY MOUTH EVERY 12 HOURS AS NEEDED FOR MUSCLE SPASM  ?  ? Not Delegated - Psychiatry: Anxiolytics/Hypnotics 2 Failed - 08/03/2021 12:03 PM  ?  ?  Failed - This refill cannot be delegated  ?  ?  Failed - Urine Drug Screen completed in last 360 days  ?  ?  Passed - Patient is not pregnant  ?  ?  Passed - Valid encounter within last 6 months  ?  Recent Outpatient Visits   ? ?      ? 3 months ago Alterations of sensations following cerebrovascular accident  ? [facility] Ok Edwards, Lillia Abed, PA-C  ? 4 months ago Alterations of sensations following cerebrovascular accident  ? [facility] Ok Edwards, Lillia Abed, PA-C  ? 5 months ago Muscle spasm  ? [facility] Merita Norton T, FNP  ? 6 months ago Nerve pain  ? [facility] Jacky Kindle, FNP  ? 7 months ago Cerebrovascular accident (CVA) due to occlusion of left middle cerebral artery (HCC)  ? [facility] Chrismon, Jodell Cipro, PA-C  ? ?  ?  ?Future Appointments   ? ?        ? In 4 days Alfredia Ferguson, PA-C [facility]  ? ?  ? ?  ?  ?  ? ?

## 2021-08-03 NOTE — Telephone Encounter (Signed)
Medication Refill - Medication: diazepam (VALIUM) 5 MG tablet ?  ?Has the patient contacted their pharmacy? No. Patient states he calls his PCP for this particular medication  ? ? ? ?Preferred Pharmacy (with phone number or street name):  ?[id number] [place], [place] - [address] Phone:  [phone]  ?Fax:  [phone]  ?  ? ? ?Has the patient been seen for an appointment in the last year OR does the patient have an upcoming appointment? Yes.   ? ?Agent: Please be advised that RX refills may take up to 3 business days. We ask that you follow-up with your pharmacy. ?

## 2021-08-06 ENCOUNTER — Ambulatory Visit: Payer: Medicare HMO | Admitting: Occupational Therapy

## 2021-08-07 ENCOUNTER — Ambulatory Visit: Payer: Medicare HMO | Admitting: Speech Pathology

## 2021-08-07 ENCOUNTER — Encounter: Payer: Self-pay | Admitting: Physical Therapy

## 2021-08-07 ENCOUNTER — Ambulatory Visit: Payer: Medicare HMO | Admitting: Physical Therapy

## 2021-08-07 ENCOUNTER — Ambulatory Visit (INDEPENDENT_AMBULATORY_CARE_PROVIDER_SITE_OTHER): Payer: Medicare HMO | Admitting: Physician Assistant

## 2021-08-07 ENCOUNTER — Encounter: Payer: Self-pay | Admitting: Physician Assistant

## 2021-08-07 VITALS — BP 122/79 | HR 73 | Ht 73.0 in | Wt 213.0 lb

## 2021-08-07 DIAGNOSIS — F410 Panic disorder [episodic paroxysmal anxiety] without agoraphobia: Secondary | ICD-10-CM | POA: Diagnosis not present

## 2021-08-07 DIAGNOSIS — I69322 Dysarthria following cerebral infarction: Secondary | ICD-10-CM | POA: Diagnosis not present

## 2021-08-07 DIAGNOSIS — M5441 Lumbago with sciatica, right side: Secondary | ICD-10-CM | POA: Diagnosis not present

## 2021-08-07 DIAGNOSIS — R278 Other lack of coordination: Secondary | ICD-10-CM | POA: Diagnosis not present

## 2021-08-07 DIAGNOSIS — Z1211 Encounter for screening for malignant neoplasm of colon: Secondary | ICD-10-CM | POA: Diagnosis not present

## 2021-08-07 DIAGNOSIS — R41841 Cognitive communication deficit: Secondary | ICD-10-CM

## 2021-08-07 DIAGNOSIS — R482 Apraxia: Secondary | ICD-10-CM | POA: Diagnosis not present

## 2021-08-07 DIAGNOSIS — R2681 Unsteadiness on feet: Secondary | ICD-10-CM

## 2021-08-07 DIAGNOSIS — R2689 Other abnormalities of gait and mobility: Secondary | ICD-10-CM

## 2021-08-07 DIAGNOSIS — G8929 Other chronic pain: Secondary | ICD-10-CM | POA: Diagnosis not present

## 2021-08-07 DIAGNOSIS — R262 Difficulty in walking, not elsewhere classified: Secondary | ICD-10-CM

## 2021-08-07 DIAGNOSIS — M545 Low back pain, unspecified: Secondary | ICD-10-CM | POA: Diagnosis not present

## 2021-08-07 DIAGNOSIS — Z8673 Personal history of transient ischemic attack (TIA), and cerebral infarction without residual deficits: Secondary | ICD-10-CM | POA: Diagnosis not present

## 2021-08-07 DIAGNOSIS — R471 Dysarthria and anarthria: Secondary | ICD-10-CM

## 2021-08-07 DIAGNOSIS — S32010S Wedge compression fracture of first lumbar vertebra, sequela: Secondary | ICD-10-CM

## 2021-08-07 DIAGNOSIS — M47817 Spondylosis without myelopathy or radiculopathy, lumbosacral region: Secondary | ICD-10-CM

## 2021-08-07 DIAGNOSIS — R4701 Aphasia: Secondary | ICD-10-CM | POA: Diagnosis not present

## 2021-08-07 DIAGNOSIS — I6930 Unspecified sequelae of cerebral infarction: Secondary | ICD-10-CM

## 2021-08-07 DIAGNOSIS — M6281 Muscle weakness (generalized): Secondary | ICD-10-CM | POA: Diagnosis not present

## 2021-08-07 DIAGNOSIS — R269 Unspecified abnormalities of gait and mobility: Secondary | ICD-10-CM

## 2021-08-07 DIAGNOSIS — G894 Chronic pain syndrome: Secondary | ICD-10-CM | POA: Diagnosis not present

## 2021-08-07 DIAGNOSIS — I63342 Cerebral infarction due to thrombosis of left cerebellar artery: Secondary | ICD-10-CM | POA: Diagnosis not present

## 2021-08-07 MED ORDER — CELECOXIB 200 MG PO CAPS: 200.0000 mg | ORAL_CAPSULE | Freq: Two times a day (BID) | ORAL | 2 refills | Status: DC

## 2021-08-07 MED ORDER — TIZANIDINE HCL 4 MG PO TABS: 4.0000 mg | ORAL_TABLET | Freq: Four times a day (QID) | ORAL | 0 refills | Status: DC

## 2021-08-07 MED ORDER — DIAZEPAM 5 MG PO TABS: ORAL_TABLET | ORAL | 0 refills | Status: DC

## 2021-08-07 NOTE — Patient Instructions (Signed)
Continued practice of strategies at home ?

## 2021-08-07 NOTE — Therapy (Signed)
[place] [facility] ?[place], [place], [address] ?Phone: [phone]   Fax:  [phone] ? ?Physical Therapy Treatment ? ?Patient Details  ?Name: David Reeves ?MRN: [id number] ?Date of Birth: Mar 23, 1971 ?Referring Provider (PT): [name] PA ? ? ?Encounter Date: 08/07/2021 ? ? PT End of Session - 08/07/21 1024   ? ? Visit Number 13   ? Number of Visits 17   ? Date for PT Re-Evaluation 08/21/21   ? Authorization Type authorized 16 visits 4/4-6/1   ? Authorization - Visit Number 2   ? Authorization - Number of Visits 16   ? Progress Note Due on Visit 20   ? PT Start Time [id number]   ? PT Stop Time 0930   ? PT Time Calculation (min) 39 min   ? Equipment Utilized During Treatment Gait belt   ? Activity Tolerance Patient limited by pain   ? Behavior During Therapy [facility] for tasks assessed/performed   ? ?  ?  ? ?  ? ? ?Past Medical History:  ?Diagnosis Date  ? Back pain 04/22/2012  ? Bone spur   ? Bulging disc 04/22/2012  ? Degenerative disc disease   ? Osteoarthritis   ? Panic anxiety syndrome   ? Stroke [facility]) 10/2020  ? Taking multiple medications for chronic disease   ? ? ?Past Surgical History:  ?Procedure Laterality Date  ? CYSTECTOMY    ? head  ? HERNIA REPAIR Left 2006,2014  ? [facility]  ? TOE SURGERY Right 2007  ? ? ?There were no vitals filed for this visit. ? ? Subjective Assessment - 08/07/21 0903   ? ? Subjective Patient reports continued low back pain but no  falls or LOB.   ? Pertinent History Pt. is a 51 y.o. who was diagnosed with a CVA on July 21st, 2022. Pt. completed several weeks of  inpatient rehab at [facility]. After returning to home. He was discharged in late August/early September and recieved home health PT. Pt. sustained a fall in december of 2022, and was admitted to the hospital with COVID-19, and Chronic back pain. He reports chronic back pain syndrome since 2012. Since the most recent discharge, Pt. has been residing with the ex-wife  until the Pt. is ready to return to independent living. He did recieve 2 weeks of home health after discharge in December 2022. He is now being referred to outpatient PT to address weakness from stroke and improve fine motor movement. He reports rarely getting numbness/tingling in RLE. PMHx includes: Bilateral LBP with right sided sciatica, Lumbosacral spondylosis myelopathy, closed compression Fx of L1. Has chronic insomnia. osteoarthritis, Panic anxiety syndrome (taking medication) Pt. enjoys cooking, and riding motorcycles. Patient is going to [facility] for Epidural spinal injections on 06/07/21;   ? Limitations Sitting;Walking   ? How long can you sit comfortably? 20 min;   ? How long can you stand comfortably? 5-10 min;   ? How long can you walk comfortably? able to walk long ways (>500 feet)   ? Diagnostic tests MRI Nov 2022- Stable chronic burst L1 fracture, progressive disc degeneration with disc bulging  eccentric to the left at L3-4 and L4-5. spinal stenosis at L5-S1;   ? Patient Stated Goals "I Need all the help  I can get. I want to move around better and reduce my back pain."   ? Pain Onset More than a month ago   ? ?  ?  ? ?  ? ? ? ? ? ? ?  Manual therapy: Patient lying in supine with moist heat to low back.  ?  ?Hamstring stretch (BLE) x 30 sec x 4 each leg.  ?Knee to chest (BLE) x 30 sec x 4 each leg ?Manual hip circles (CW/CCW) x 10 reps each direction and each LE ?Pirifomis x 30 sec x 3 sets each LE ?-increased muscular tightness noted with all position on the R ( involved) side  ? ?  ?Patient reported pain improved from 4/10 down to 3/10 after stretching.  ?  ?Therex:  ?Lower trunk rotation- BLE x 5*5 sec holds on each side   ?Demonstrated patient self stretching technique for knee-to-chest stretch as well as piriformis stretch on right lower extremity patient demonstrates good technique when completing this with cues.  Completed x30 seconds each side ?-Patient does report he feels better  stretch when performed by physical therapist but can still feel stretch with self stretching techniques. ? ? ?NMR: Unless otherwise stated, CGA was provided and gait belt donned in order to ensure pt safety ? ? Neurocom balance games to improve weight shifting to R LE on unsupported surface with feet at level 7 and with level 4 stability setting on machine ?*8 rounds of dot pop dynamic weight shifting activity ?*6 rounds of scale lateral weight shifting activity ?- both rounds pt improved with practice but had difficulty with R side weight shifting and balance  ?-cues for right weight shifting when games challenged this task specifically, pt reports he has difficulty noticing his weight shifting and feedback from screen in game is helpful for this.  ? ?Education provided throughout session via VC/TC and demonstration to facilitate movement at target joints and correct muscle activation for all testing and exercises performed.  ? ? ? ? ? ? ? ? ? ? ? ? ? ? ? ? ? ? ? ? ? ? ? ? ? ? ? ? ? ? ? ? ? PT Education - 08/07/21 1024   ? ? Education Details exercise form and technique   ? Person(s) Educated Patient   ? Methods Explanation;Verbal cues   ? Comprehension Verbalized understanding;Returned demonstration;Verbal cues required;Need further instruction   ? ?  ?  ? ?  ? ? ? PT Short Term Goals - 07/24/21 1152   ? ?  ? PT SHORT TERM GOAL #1  ? Title Patient will be adherent to HEP at least 3x a week to improve functional strength and balance for better safety at home.   ? Baseline 4/4: doing them 1-2x a week   ? Time 4   ? Period Weeks   ? Status Partially Met   ? Target Date 08/21/21   ?  ? PT SHORT TERM GOAL #2  ? Title Patient (< 67 years old) will complete five times sit to stand test in < 15 seconds indicating an increased LE strength and improved balance.   ? Baseline 4/4: 17.85 sec with arms across chest   ? Time 4   ? Period Weeks   ? Status Partially Met   ? Target Date 08/21/21   ? ?  ?  ? ?  ? ? ? ? PT Long Term  Goals - 07/24/21 1215   ? ?  ? PT LONG TERM GOAL #1  ? Title Patient will increase six minute walk test distance to >1000 for progression to community ambulator and improve gait ability   ? Baseline 4/4: 1115 feet   ? Time 4   ? Period Weeks   ?  Status Achieved   ? Target Date 08/21/21   ?  ? PT LONG TERM GOAL #2  ? Title Patient will ascend/descend 8 stairs without rail assist independently without loss of balance to improve ability to get in/out of home.   ? Baseline 4/4: requires 1 rail assist   ? Time 4   ? Period Weeks   ? Status Partially Met   ? Target Date 08/21/21   ?  ? PT LONG TERM GOAL #3  ? Title Patient will increase BLE gross strength to 4+/5 as to improve functional strength for independent gait, increased standing tolerance and increased ADL ability.   ? Baseline 4/4: not formally assessed;   ? Time 4   ? Period Weeks   ? Status Partially Met   ? Target Date 08/21/21   ?  ? PT LONG TERM GOAL #4  ? Title Patient will improve FOTO score to >50% to indicate improved functional mobility with less pain with ADLs.   ? Baseline 4/4: 35%   ? Time 4   ? Period Weeks   ? Status Not Met   ? Target Date 08/21/21   ?  ? PT LONG TERM GOAL #5  ? Title Patient will report a worst pain of 4/10 in low back over last week to indicate improved tolerance with ADLs.   ? Baseline 4/4: 7/10   ? Time 4   ? Period Weeks   ? Status Not Met   ? Target Date 08/21/21   ? ?  ?  ? ?  ? ? ? ? ? ? ? ? Plan - 08/07/21 1025   ? ? Clinical Impression Statement Patient pleasant, motivated, participated well within session.  Patient continues to have significant complaints of low back pain which is alleviated with supine positioning, moist heat, and manual stretching techniques.  Patient continues to consistently report decrease in pain levels following these interventions.  Patient encouraged to continue with these interventions stretches as part of his home exercise program.  Patient continues to progress with dynamic weight shifting  activities utilizing core balance machine for feedback for weight shifting.  Patient did improve with practice but continues to have significant difficulty with weight shifting to right side as well as weig

## 2021-08-07 NOTE — Therapy (Signed)
[place] [facility] ?[place], [place], [address] ?Phone: [phone]   Fax:  [phone] ? ?Speech Language Pathology Treatment ? ?Patient Details  ?Name: David Reeves ?MRN: [id number] ?Date of Birth: Mar 15, 1971 ?Referring Provider (SLP): [name] ? ? ?Encounter Date: 08/07/2021 ? ? End of Session - 08/07/21 1306   ? ? Visit Number 12   ? Number of Visits 25   ? Date for SLP Re-Evaluation 08/27/21   ? Authorization Type [employer] H[employer]   ? Authorization Time Period 06/04/2021 thru 08/27/2021   ? Authorization - Visit Number 2   ? Progress Note Due on Visit 10   ? SLP Start Time 1000   ? SLP Stop Time  1100   ? SLP Time Calculation (min) 60 min   ? Activity Tolerance Patient tolerated treatment well   ? ?  ?  ? ?  ? ? ?Past Medical History:  ?Diagnosis Date  ? Back pain 04/22/2012  ? Bone spur   ? Bulging disc 04/22/2012  ? Degenerative disc disease   ? Osteoarthritis   ? Panic anxiety syndrome   ? Stroke [facility]) 10/2020  ? Taking multiple medications for chronic disease   ? ? ?Past Surgical History:  ?Procedure Laterality Date  ? CYSTECTOMY    ? head  ? HERNIA REPAIR Left 2006,2014  ? [facility]  ? TOE SURGERY Right 2007  ? ? ?There were no vitals filed for this visit. ? ? Subjective Assessment - 08/07/21 1255   ? ? Subjective Patient pleasant and motivated for session   ? Currently in Pain? No/denies   ? ?  ?  ? ?  ? ? ? ? ? ? ? ? ADULT SLP TREATMENT - 08/07/21 0001   ? ?  ? General Information  ? Behavior/Cognition Cooperative;Alert;Pleasant mood   ?  ? Treatment Provided  ? Treatment provided Cognitive-Linquistic   ?  ? Cognitive-Linquistic Treatment  ? Treatment focused on Aphasia;Apraxia;Patient/family/caregiver education   ? Skilled Treatment Skilled treatment session targeted improving motor speech for intelligibility via dysarthria strategies. Patient demonstrated independent recall of strategies: overarticulation and pacing board.  Patient also independent with identifying when pacing board needed though required cues for overarticulation strategy which is word specific compared to pacer which is sentence specific strategy. Patient demonstrated overarticulation with 60% acc x5 conversational responses and 60% x5 lines from paragraph reading. Both improved with SLP verbal direction for overarticulation strategy. Patient IND demonstrated use of external pacer in 10/10 opportunities though is challenged to fade from an external pacer (paper board) to internal/inconspicuous pacer such as pressing fingers as demonstrated in x5 opportunities x2 still required external pacer to improve intelligiblity. Patient dmeonstrated paragraph reading with 80% acc x5 wh questions, improved with verbal cues to specificity of questions   ? ?  ?  ? ?  ? ? ? SLP Education - 08/07/21 1306   ? ? Education Details strategy function and improvement with use   ? Person(s) Educated Patient   ? Methods Demonstration;Explanation;Verbal cues   ? Comprehension Verbalized understanding;Returned demonstration;Need further instruction   ? ?  ?  ? ?  ? ? ? SLP Short Term Goals - 07/28/21 1830   ? ?  ? SLP SHORT TERM GOAL #1  ? Title Pt will use the speech intelligibility strategy of pacing to slow rate of speech at the simple sentence level in 8 out of 10 opportunities.   ? Time 10   ?  Period --   sessions  ? Status On-going   ?  ? SLP SHORT TERM GOAL #2  ? Title Pt will produce fricatives in the initial, final and medial positions of words with 90% accuracy.   ? Time 10   ? Period --   sessions  ? Status On-going   ?  ? SLP SHORT TERM GOAL #3  ? Title Pt will use speech intelligibility strategy of overarticulation to improve speech intelligibility to 75% at the simple sentence level.   ? Time 10   ? Period --   sessions  ?  ? SLP SHORT TERM GOAL #4  ? Title Pt will answer [place] questions after reading paragraph level passage with 75% accuracy.   ? Time 10   ? Period --   sessions   ? Status On-going   ? ?  ?  ? ?  ? ? ? SLP Long Term Goals - 07/28/21 1831   ? ?  ? SLP LONG TERM GOAL #1  ? Title Pt will use speech intelligibility stategies to achieve 50% speech intelligibility at the sentence level.   ? Status On-going   ? Target Date 08/27/21   ? ?  ?  ? ?  ? ? ? Plan - 08/07/21 1307   ? ? 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 Intervention targeting strategy use for improved intelligiblity with good progress though patient continues to benefit from verbal cues for which strategy is required when and carrying over independently vs with reduced interfering external aids. As a result, skilled ST intervention is required to instruct pt in speech intelligibility strategies to increase functional independence and reduce caregiver burden.   ? Speech Therapy Frequency 2x / week   ? Duration 12 weeks   ? Treatment/Interventions Language facilitation;Compensatory techniques;Cueing hierarchy;Internal/external aids;SLP instruction and feedback;Patient/family education;Compensatory strategies   ? Potential to Achieve Goals Good   ? SLP Home Exercise Plan provided, see pt instructions section   ? Consulted and Agree with Plan of Care Patient   ? ?  ?  ? ?  ? ? ?Patient will benefit from skilled therapeutic intervention in order to improve the following deficits and impairments:   ?Dysarthria and anarthria ? ?Cognitive communication deficit ? ? ? ?Problem List ?Patient Active Problem List  ? Diagnosis Date Noted  ? Alterations of sensations following cerebrovascular accident 03/08/2021  ? Aphasia as late effect of cerebrovascular accident (CVA) 03/08/2021  ? Muscle spasm 02/22/2021  ? Acute non-recurrent frontal sinusitis 02/22/2021  ? Chronic pain syndrome 01/23/2021  ? Primary hypertension 01/23/2021  ? Nerve pain 01/23/2021  ? Erectile dysfunction due to diseases classified elsewhere  01/23/2021  ? Cognitive dysfunction 01/23/2021  ? Scrotal edema 01/23/2021  ? Elevated LDL cholesterol level 01/23/2021  ? History of stroke with residual deficit 01/23/2021  ? Primary insomnia 01/23/2021  ? Mouth pain 01/23/2021  ? Laceration of right hand without foreign body   ? Closed compression fracture of L1 vertebra ([place]) 05/24/2016  ? Lumbar stenosis with neurogenic claudication 11/01/2015  ? Chronic 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 11/08/2014  ? Hx of hemorrhoids 11/08/2014  ? AAA (abdominal aortic aneurysm) ([place]) 09/22/2014  ? Chronic pain associated with significant psychosocial dysfunction 09/22/2014  ? Panic disorder 09/22/2014  ? AB (asthmatic bronchitis) 08/17/2014  ? Anxiety 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 08/17/2014  ? Backache 08/17/2014  ?  Lumbosacral spondylosis without myelopathy 08/17/2014  ? Disorder of male genital organs 08/17/2014  ? Brash 08/17/2014  ? Low back pain 08/17/2014  ? Tendon nodule 08/17/2014  ? Episodic paroxysmal anxiety disorder 08/17/2014  ? Hernia, inguinal, right 08/17/2014  ? Fast heart beat 08/17/2014  ? Illness 08/17/2014  ? Inguinal hernia 10/14/2012  ? ?[place] L. Morganna Styles, M.A. CCC-SLP ?Adult-based Speech Language Pathologist ?[place] ?[phone] ? ?Dalbert Batman, CCC-SLP ?08/07/2021, 1:09 PM ? ?[place] [facility] ?[place], [place], [address] ?Phone: [phone]   Fax:  [phone] ? ? ?Name: David Reeves ?MRN: [id number] ?Date of Birth: [birth year] ? ?

## 2021-08-07 NOTE — Patient Instructions (Addendum)
[facility] at [facility] ?[phone] ?

## 2021-08-07 NOTE — Progress Notes (Deleted)
?  ? ?  I,Sha'taria Roverto Bodmer,acting as a Education administrator for [employer], PA-C.,have documented all relevant documentation on the behalf of [name], PA-C,as directed by  [name], PA-C while in the presence of [name], PA-C. ? ? ?Established patient visit ? ? ?Patient: David Reeves   DOB: October 07, 1970   51 y.o. Male  MRN: [id number] ?Visit Date: 08/07/2021 ? ?Today's healthcare provider: [name], PA-C  ? ?No chief complaint on file. ? ?Subjective  ?  ?HPI  ?Patient reports PT/OT/Speech all going good. Pain varies sometimes 3-4/10. Heating pad everyday and back brace. Waiting on refferal from neuro surgeon and pain doctor. Has been turned down for pain clinic and has not been given a reason as to why. [facility] pain clinics. Would like to change 2 of medications (muscle relaxant to dantrolene) and NSAIDs ? ?Medications: ?Outpatient Medications Prior to Visit  ?Medication Sig  ? acetaminophen (TYLENOL) 500 MG tablet Take 1,000 mg by mouth every 6 (six) hours as needed for mild pain.  ? amLODipine (NORVASC) 5 MG tablet Take 1 tablet (5 mg total) by mouth daily.  ? aspirin EC 81 MG tablet Take 81 mg by mouth daily. Swallow whole.  ? atorvastatin (LIPITOR) 80 MG tablet Take 1 tablet (80 mg total) by mouth daily.  ? Cholecalciferol (VITAMIN D3) 2000 units TABS Take by mouth.  ? diazepam (VALIUM) 5 MG tablet TAKE 1 TABLET (5 MG TOTAL) BY MOUTH EVERY 12 HOURS AS NEEDED FOR MUSCLE SPASM  ? donepezil (ARICEPT) 10 MG tablet Take 1 tablet (10 mg total) by mouth at bedtime.  ? meloxicam (MOBIC) 15 MG tablet Take 1 tablet (15 mg total) by mouth daily.  ? Multiple Vitamin (MULTIVITAMIN) tablet Take 1 tablet by mouth daily.  ? prazosin (MINIPRESS) 1 MG capsule TAKE 2 CAPSULES (2 MG TOTAL) BY MOUTH AT BEDTIME. DO NOT TAKE WITH AMBIEN.  ? tiZANidine (ZANAFLEX) 4 MG tablet Take 1 tablet (4 mg total) by mouth in the morning, at noon, in the evening, and at bedtime.  ? zolpidem (AMBIEN) 10 MG tablet Take 1 tablet (10 mg  total) by mouth at bedtime.  ? ?No facility-administered medications prior to visit.  ? ? ?Review of Systems ? ?{Labs  Heme  Chem  Endocrine  Serology  Results Review (optional):23779} ?  Objective  ?  ?There were no vitals taken for this visit. ?{Show previous vital signs (optional):23777} ? ?Physical Exam  ?*** ? ?No results found for any visits on 08/07/21. ? Assessment & Plan  ?  ? ?*** ? ?No follow-ups on file.  ?   ? ?{provider attestation***:1} ? ? ?[name], PA-C  ?[place] ?[phone] (phone) ?[phone] (fax) ? ? Medical Group ? ?

## 2021-08-08 ENCOUNTER — Other Ambulatory Visit: Payer: Self-pay | Admitting: Physician Assistant

## 2021-08-08 ENCOUNTER — Ambulatory Visit: Payer: Medicare HMO | Admitting: Occupational Therapy

## 2021-08-08 ENCOUNTER — Encounter: Payer: Self-pay | Admitting: Physician Assistant

## 2021-08-08 DIAGNOSIS — G8929 Other chronic pain: Secondary | ICD-10-CM

## 2021-08-08 DIAGNOSIS — I6930 Unspecified sequelae of cerebral infarction: Secondary | ICD-10-CM

## 2021-08-08 NOTE — Progress Notes (Signed)
?  ? ?I,Sha'taria Tyson,acting as a Neurosurgeon for [employer], PA-C.,have documented all relevant documentation on the behalf of David Ferguson, PA-C,as directed by  David Ferguson, PA-C while in the presence of David Ferguson, PA-C. ? ? ?Established patient visit ? ? ?Patient: David Reeves   DOB: 07/05/1970   51 y.o. Male  MRN: [id number] ?Visit Date: 08/07/2021 ? ?Today's healthcare provider: Alfredia Ferguson, PA-C  ? ?Cc. Chronic pain follow up ? ?Subjective  ?  ?HPI  ?David Reeves is a 51 y/o male with PMH of a CVA CVA 5/22 with residual right sided upper extremity deficits, speech impediment, and nighttime disturbances/panic attacks. ?He reports progressing extremely well in PT/OT/speech therapy, has far more mobility in his right arm, increase grip strength, overall improved pain and mobility. He is keeping his appointments. He wears a back brace for additional support. He had a recent injection/epidural through a pain clinic that he states didn't improve any pain. His pain is now rated at a 3-4/10. Still improved when laying flat. ? ?He has questions about changing his muscle relaxant to dantrolene and changing his nsaid to one that can be taken twice daily.  ? ?At a recent pharmacy trip, he had a pending refill of valium from an ED visit 11/22 and a script for tinazidine from an older provider here that he filled. He realized he shouldn't have filled the valium rx as he still had some from his rx he gets from me. He was out of the muscle relaxant. As the rx he picked up says to take it TID instead of QID and he states at TID he gets breakthrough muscle spasms.  ? ?Denies any further nightmares/nighttime panic attacks since starting prazosin. Ambien has not been refilled since starting.  ? ?Medications: ?Outpatient Medications Prior to Visit  ?Medication Sig  ? acetaminophen (TYLENOL) 500 MG tablet Take 1,000 mg by mouth every 6 (six) hours as needed for mild pain.  ? amLODipine (NORVASC) 5 MG tablet Take 1  tablet (5 mg total) by mouth daily.  ? aspirin EC 81 MG tablet Take 81 mg by mouth daily. Swallow whole.  ? atorvastatin (LIPITOR) 80 MG tablet Take 1 tablet (80 mg total) by mouth daily.  ? Cholecalciferol (VITAMIN D3) 2000 units TABS Take by mouth.  ? donepezil (ARICEPT) 10 MG tablet Take 1 tablet (10 mg total) by mouth at bedtime.  ? Multiple Vitamin (MULTIVITAMIN) tablet Take 1 tablet by mouth daily.  ? prazosin (MINIPRESS) 1 MG capsule TAKE 2 CAPSULES (2 MG TOTAL) BY MOUTH AT BEDTIME. DO NOT TAKE WITH AMBIEN.  ? [DISCONTINUED] diazepam (VALIUM) 5 MG tablet TAKE 1 TABLET (5 MG TOTAL) BY MOUTH EVERY 12 HOURS AS NEEDED FOR MUSCLE SPASM  ? [DISCONTINUED] meloxicam (MOBIC) 15 MG tablet Take 1 tablet (15 mg total) by mouth daily.  ? [DISCONTINUED] tiZANidine (ZANAFLEX) 4 MG tablet Take 1 tablet (4 mg total) by mouth in the morning, at noon, in the evening, and at bedtime.  ? zolpidem (AMBIEN) 10 MG tablet Take 1 tablet (10 mg total) by mouth at bedtime.  ? ?No facility-administered medications prior to visit.  ? ? ?Review of Systems ?Review of Systems  ?Constitutional:  Negative for fever and malaise/fatigue.  ?Cardiovascular:  Negative for chest pain, palpitations and leg swelling.  ?Neurological:  Negative for dizziness and headaches.  ? ?  Objective  ?  ?BP 122/79 (BP Location: Left Arm, Patient Position: Sitting, Cuff Size: Normal)   Pulse 73   Ht 6'  1" (1.854 m)   Wt 213 lb (96.6 kg)   SpO2 100%   BMI 28.10 kg/m?  ? ? ?Physical Exam  ?Physical Exam ?Constitutional:   ?   Appearance: Normal appearance. He is not ill-appearing.  ?Eyes:  ?   Conjunctiva/sclera: Conjunctivae normal.  ?Cardiovascular:  ?   Rate and Rhythm: Normal rate and regular rhythm.  ?   Pulses: Normal pulses.  ?   Heart sounds: Normal heart sounds.  ?Pulmonary:  ?   Effort: Pulmonary effort is normal.  ?   Breath sounds: Normal breath sounds.  ?Musculoskeletal:  ?   Comments: No tenderness to R midback or flank ?Improved ROM to right  shoulder ?Improved grip strength/mobility of right hand/fingers  ?Neurological:  ?   Mental Status: He is oriented to person, place, and time.  ?Psychiatric:     ?   Mood and Affect: Mood normal.     ?   Behavior: Behavior normal.  ? ? ? ?No results found for any visits on 08/07/21. ? Assessment & Plan  ?  ? ?Problem List Items Addressed This Visit   ? ?  ? Nervous and Auditory  ? Chronic 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  ? Relevant Medications  ? celecoxib (CELEBREX) 200 MG capsule  ? tiZANidine (ZANAFLEX) 4 MG tablet  ? diazepam (VALIUM) 5 MG tablet (Start on 08/12/2021)  ?  ? Musculoskeletal and Integument  ? Lumbosacral spondylosis without myelopathy  ? Relevant Medications  ? celecoxib (CELEBREX) 200 MG capsule  ? tiZANidine (ZANAFLEX) 4 MG tablet  ? diazepam (VALIUM) 5 MG tablet (Start on 08/12/2021)  ?  ? Other  ? Panic disorder  ?  Resolution of nighttmares/nighttime panic attacks on prazosin.  ?Will not refill ambien, no need currently.  ? ?  ?  ? Relevant Medications  ? diazepam (VALIUM) 5 MG tablet (Start on 08/12/2021)  ? Chronic pain syndrome  ? Relevant Medications  ? celecoxib (CELEBREX) 200 MG capsule  ? tiZANidine (ZANAFLEX) 4 MG tablet  ? History of stroke with residual deficit - Primary  ?  Very proud of Tim for his progress and overall improved affect so far. ?Encouraged continuing OT/PT. 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  ?For pain management, will continue valium 5 mg BID, with understanding of why pt picked up additional rx early-- I am confident he is not misusing. Has no problem with sending in a late refill d/t extra rx at home. ?We completed a benzo compliance agreement in office in view of accidental refill and I advised caution with repeating this. ?Advised against switching muscle relaxant, okay to take current rx at QID and will send in refill.  ?Fine with switching mobic to celebrex 200 mg bid.  ?[facility]--but missed the calls.  Gave contact info ? ?  ?  ? Relevant Medications  ? celecoxib (CELEBREX) 200 MG capsule  ? tiZANidine (ZANAFLEX) 4 MG tablet  ? ?Other Visit Diagnoses   ? ? Screening for colon cancer      ? Relevant Orders  ? Cologuard  ? ?  ?  ? ?Return in about 4 months (around 12/07/2021) for chronic pain, hypertension.  ?   ? ?I, David Ferguson, PA-C have reviewed all documentation for this visit. The documentation on  08/08/2021  for the exam, diagnosis, procedures, and orders are all accurate and complete. ? ?David Ferguson, PA-C ?[facility] ?[address] ?[place], [place], [address] ?Office:  [phone] ?Fax: [phone]  ? ?[facility] ? ?

## 2021-08-08 NOTE — Assessment & Plan Note (Signed)
Resolution of nighttmares/nighttime panic attacks on prazosin.  ?Will not refill ambien, no need currently.  ?

## 2021-08-08 NOTE — Assessment & Plan Note (Signed)
Very proud of Tim for his progress and overall improved affect so far. ?Encouraged continuing OT/PT. 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  ?For pain management, will continue valium 5 mg BID, with understanding of why pt picked up additional rx early-- I am confident he is not misusing. Has no problem with sending in a late refill d/t extra rx at home. ?We completed a benzo compliance agreement in office in view of accidental refill and I advised caution with repeating this. ?Advised against switching muscle relaxant, okay to take current rx at QID and will send in refill.  ?Fine with switching mobic to celebrex 200 mg bid.  ?[facility]--but missed the calls. Gave contact info ?

## 2021-08-09 ENCOUNTER — Other Ambulatory Visit: Payer: Self-pay | Admitting: Physician Assistant

## 2021-08-09 ENCOUNTER — Encounter: Payer: Self-pay | Admitting: Physician Assistant

## 2021-08-09 DIAGNOSIS — I6930 Unspecified sequelae of cerebral infarction: Secondary | ICD-10-CM

## 2021-08-09 DIAGNOSIS — G8929 Other chronic pain: Secondary | ICD-10-CM

## 2021-08-10 ENCOUNTER — Ambulatory Visit: Payer: Medicare HMO | Admitting: Speech Pathology

## 2021-08-10 ENCOUNTER — Ambulatory Visit: Payer: Medicare HMO

## 2021-08-10 ENCOUNTER — Other Ambulatory Visit: Payer: Self-pay | Admitting: Physician Assistant

## 2021-08-10 ENCOUNTER — Telehealth: Payer: Self-pay | Admitting: Physician Assistant

## 2021-08-10 DIAGNOSIS — Z8673 Personal history of transient ischemic attack (TIA), and cerebral infarction without residual deficits: Secondary | ICD-10-CM | POA: Diagnosis not present

## 2021-08-10 DIAGNOSIS — R278 Other lack of coordination: Secondary | ICD-10-CM

## 2021-08-10 DIAGNOSIS — M6281 Muscle weakness (generalized): Secondary | ICD-10-CM

## 2021-08-10 DIAGNOSIS — R2681 Unsteadiness on feet: Secondary | ICD-10-CM

## 2021-08-10 DIAGNOSIS — I6930 Unspecified sequelae of cerebral infarction: Secondary | ICD-10-CM

## 2021-08-10 DIAGNOSIS — R482 Apraxia: Secondary | ICD-10-CM

## 2021-08-10 DIAGNOSIS — R262 Difficulty in walking, not elsewhere classified: Secondary | ICD-10-CM | POA: Diagnosis not present

## 2021-08-10 DIAGNOSIS — R4701 Aphasia: Secondary | ICD-10-CM | POA: Diagnosis not present

## 2021-08-10 DIAGNOSIS — M545 Low back pain, unspecified: Secondary | ICD-10-CM | POA: Diagnosis not present

## 2021-08-10 DIAGNOSIS — R269 Unspecified abnormalities of gait and mobility: Secondary | ICD-10-CM | POA: Diagnosis not present

## 2021-08-10 DIAGNOSIS — G8929 Other chronic pain: Secondary | ICD-10-CM

## 2021-08-10 DIAGNOSIS — R2689 Other abnormalities of gait and mobility: Secondary | ICD-10-CM

## 2021-08-10 MED ORDER — TIZANIDINE HCL 4 MG PO TABS: 4.0000 mg | ORAL_TABLET | Freq: Four times a day (QID) | ORAL | 0 refills | Status: DC

## 2021-08-10 NOTE — Therapy (Signed)
[place] [facility] ?[place], [place], [address] ?Phone: [phone]   Fax:  [phone] ? ?Physical Therapy Treatment ? ?Patient Details  ?Name: David Reeves ?MRN: [id number] ?Date of Birth: 09/11/1970 ?Referring Provider (PT): [name] PA ? ? ?Encounter Date: 08/10/2021 ? ? PT End of Session - 08/10/21 1043   ? ? Visit Number 14   ? Number of Visits 17   ? Date for PT Re-Evaluation 08/21/21   ? Authorization Type [employer]- authorized 16 visits 4/4-6/1   ? Authorization - Visit Number 3   ? Authorization - Number of Visits 16   ? Progress Note Due on Visit 20   ? PT Start Time 1022   ? PT Stop Time 1102   ? PT Time Calculation (min) 40 min   ? Equipment Utilized During Treatment Gait belt   ? Activity Tolerance Patient tolerated treatment well;No increased pain   ? Behavior During Therapy [facility] for tasks assessed/performed   ? ?  ?  ? ?  ? ? ?Past Medical History:  ?Diagnosis Date  ? Back pain 04/22/2012  ? Bone spur   ? Bulging disc 04/22/2012  ? Degenerative disc disease   ? Osteoarthritis   ? Panic anxiety syndrome   ? Stroke [facility]) 10/2020  ? Taking multiple medications for chronic disease   ? ? ?Past Surgical History:  ?Procedure Laterality Date  ? CYSTECTOMY    ? head  ? HERNIA REPAIR Left 2006,2014  ? [facility]  ? TOE SURGERY Right 2007  ? ? ?There were no vitals filed for this visit. ? ? Subjective Assessment - 08/10/21 1028   ? ? Subjective Pt busy cooking yesterday, thinks he may have exceeded his capacity to tolerate the activity. His back pain is much worse today. He conitnues to use heat constantly. He continues to use PRN pain meds at home as needed.   ? Pertinent History Pt. is a 51 y.o. who was diagnosed with a CVA on July 21st, 2022. Pt. completed several weeks of  inpatient rehab at [facility]. After returning to home. He was discharged in late August/early September and recieved home health PT. Pt. sustained a fall in december of  2022, and was admitted to the hospital with COVID-19, and Chronic back pain. He reports chronic back pain syndrome since 2012. Since the most recent discharge, Pt. has been residing with the ex-wife until the Pt. is ready to return to independent living. He did recieve 2 weeks of home health after discharge in December 2022. He is now being referred to outpatient PT to address weakness from stroke and improve fine motor movement. He reports rarely getting numbness/tingling in RLE. PMHx includes: Bilateral LBP with right sided sciatica, Lumbosacral spondylosis myelopathy, closed compression Fx of L1. Has chronic insomnia. osteoarthritis, Panic anxiety syndrome (taking medication) Pt. enjoys cooking, and riding motorcycles. Patient is going to [facility] spine center for Epidural spinal injections on 06/07/21;   ? Pain Score 5    ? ?  ?  ? ?  ? ? ? ?INTERVENTION THIS DATE: ?-Supine on Lumbar hot pack  ?-Attempted P/ROM Stretching hamstrings, SKTC, FABER: all hypermobile, no stretch indicated this date. Good symmetry. ?-manual release of lumber paraspinals and multifidus bilat  ?-hooklying marching x30 ?-GTB clams x20 ? ?-manual release of lumber paraspinals and multifidus bilat  ?-hooklying marching x30 ?-BlackTB clams x20 ? ?-sitting EOB seated deadlift 1x12 (deafaults to LUE only with related rotation in trunk)  ?-  STS from elevated plinth 1x12 ?-seated marching x30 ? ? ?-sitting EOB seated deadlift 1x12 (cues for BUE use and noted improvement on symmetry in trunk rotation)  ?-STS from elevated plinth 1x12 (lower this set)  ?-seated marching x30  ? ?*education on following up with doctor regarding LSO- I suspect this is no longer needed and may be causing more dysfunction and discomfort  ? ? ? PT Education - 08/10/21 1044   ? ? Education Details technique and exercises   ? Person(s) Educated Patient   ? Methods Explanation   ? Comprehension Verbalized understanding   ? ?  ?  ? ?  ? ? ? PT Short Term Goals - 07/24/21 1152    ? ?  ? PT SHORT TERM GOAL #1  ? Title Patient will be adherent to HEP at least 3x a week to improve functional strength and balance for better safety at home.   ? Baseline 4/4: doing them 1-2x a week   ? Time 4   ? Period Weeks   ? Status Partially Met   ? Target Date 08/21/21   ?  ? PT SHORT TERM GOAL #2  ? Title Patient (< 52 years old) will complete five times sit to stand test in < 15 seconds indicating an increased LE strength and improved balance.   ? Baseline 4/4: 17.85 sec with arms across chest   ? Time 4   ? Period Weeks   ? Status Partially Met   ? Target Date 08/21/21   ? ?  ?  ? ?  ? ? ? ? PT Long Term Goals - 07/24/21 1215   ? ?  ? PT LONG TERM GOAL #1  ? Title Patient will increase six minute walk test distance to >1000 for progression to community ambulator and improve gait ability   ? Baseline 4/4: 1115 feet   ? Time 4   ? Period Weeks   ? Status Achieved   ? Target Date 08/21/21   ?  ? PT LONG TERM GOAL #2  ? Title Patient will ascend/descend 8 stairs without rail assist independently without loss of balance to improve ability to get in/out of home.   ? Baseline 4/4: requires 1 rail assist   ? Time 4   ? Period Weeks   ? Status Partially Met   ? Target Date 08/21/21   ?  ? PT LONG TERM GOAL #3  ? Title Patient will increase BLE gross strength to 4+/5 as to improve functional strength for independent gait, increased standing tolerance and increased ADL ability.   ? Baseline 4/4: not formally assessed;   ? Time 4   ? Period Weeks   ? Status Partially Met   ? Target Date 08/21/21   ?  ? PT LONG TERM GOAL #4  ? Title Patient will improve FOTO score to >50% to indicate improved functional mobility with less pain with ADLs.   ? Baseline 4/4: 35%   ? Time 4   ? Period Weeks   ? Status Not Met   ? Target Date 08/21/21   ?  ? PT LONG TERM GOAL #5  ? Title Patient will report a worst pain of 4/10 in low back over last week to indicate improved tolerance with ADLs.   ? Baseline 4/4: 7/10   ? Time 4   ?  Period Weeks   ? Status Not Met   ? Target Date 08/21/21   ? ?  ?  ? ?  ? ? ? ? ? ? ? ?  Plan - 08/10/21 1045   ? ? Clinical Impression Statement Pt in more pain today. Focus on transition back to gradual improvements in tolerated gross movements. Pt largely has been restricted and rigid more recently. Pt has good tolerance to activity in session. His pain is reduced by 1 point NPRS but his face says otherwise, much less distressed in appearance by end of session.   ? Personal Factors and Comorbidities Comorbidity 3+;Past/Current Experience;Time since onset of injury/illness/exacerbation   ? Comorbidities PMHx includes: Bilateral LBP with right sided sciatica, Lumbosacral spondylosis myelopathy, closed compression Fx of L1. Has chronic insomnia. osteoarthritis, Panic anxiety syndrome (taking medication)   ? Examination-Participation Restrictions [employer]   ? Stability/Clinical Decision Making Stable/Uncomplicated   ? Clinical Decision Making Low   ? Rehab Potential Good   ? PT Frequency 2x / week   ? PT Duration 4 weeks   ? PT Treatment/Interventions Cryotherapy;Electrical Stimulation;Moist Heat;Traction;Gait training;Stair training;Functional mobility training;Therapeutic activities;Therapeutic exercise;Balance training;Neuromuscular re-education;Patient/family education;Manual techniques;Passive range of motion;Dry needling;Energy conservation   ? PT Next Visit Plan Progress core stabilization, Manual therapy for low back and LE ROM   ? Consulted and Agree with Plan of Care Patient   ? ?  ?  ? ?  ? ? ?Patient will benefit from skilled therapeutic intervention in order to improve the following deficits and impairments:  Abnormal gait, Decreased endurance, Decreased mobility, Difficulty walking, Increased muscle spasms, Decreased range of motion, Decreased activity tolerance, Decreased strength, Postural dysfunction, Pain ? ?Visit Diagnosis: ?Abnormality of gait and mobility ? ?Difficulty in walking, not elsewhere  classified ? ?Other abnormalities of gait and mobility ? ?Unsteadiness on feet ? ? ? ? ?Problem List ?Patient Active Problem List  ? Diagnosis Date Noted  ? Alterations of sensations following cerebrovascular a

## 2021-08-11 ENCOUNTER — Other Ambulatory Visit: Payer: Self-pay | Admitting: Family Medicine

## 2021-08-11 DIAGNOSIS — M62838 Other muscle spasm: Secondary | ICD-10-CM

## 2021-08-11 NOTE — Therapy (Signed)
[place] [facility] ?[place], [place], [address] ?Phone: [phone]   Fax:  [phone] ? ?Occupational Therapy Progress Note/Recertification ? ?Patient Details  ?Name: David Reeves ?MRN: [id number] ?Date of Birth: 1970/06/17 ?Referring Provider (OT): [name] ? ? ?Encounter Date: 08/10/2021 ? ? OT End of Session - 08/11/21 1325   ? ? Visit Number 20   ? Number of Visits 24   ? Date for OT Re-Evaluation 08/15/21   ? Authorization Type Progress report period starting 06/22/2021   ? OT Start Time 1100   ? OT Stop Time 1145   ? OT Time Calculation (min) 45 min   ? Activity Tolerance Patient tolerated treatment well   ? Behavior During Therapy [facility] for tasks assessed/performed   ? ?  ?  ? ?  ? ? ?Past Medical History:  ?Diagnosis Date  ? Back pain 04/22/2012  ? Bone spur   ? Bulging disc 04/22/2012  ? Degenerative disc disease   ? Osteoarthritis   ? Panic anxiety syndrome   ? Stroke [facility]) 10/2020  ? Taking multiple medications for chronic disease   ? ? ?Past Surgical History:  ?Procedure Laterality Date  ? CYSTECTOMY    ? head  ? HERNIA REPAIR Left 2006,2014  ? [facility]  ? TOE SURGERY Right 2007  ? ? ?There were no vitals filed for this visit. ? ? Subjective Assessment - 08/10/21 1319   ? ? Subjective  Pt reports doing well today.   ? Patient is accompanied by: Family member   ? Pertinent History Pt. is a 51 y.o. who was diagnosed with a CVA on July 21st, 2022. Pt. completed several weeks of  inpatient rehab at [facility]. After returning to home, Pt. sustained a fall in december of 2022, and was admitted to the hospital with COVI-19, and back pain from the fall. Since the most recent discharge, Pt. has been residing with the ex-wife until the Pt. is ready to return to independent living. Pt. PMHx includes: Bilateral LBP with right sided sciatica, Lumbosacral spondylosis myelopathy, closed compression Fx of L1. Pt. enjoys cooking, and riding motorcycles.    ? Patient Stated Goals Pt would like to be as independent as he was before, improve hand function.   ? Currently in Pain? Yes   ? Pain Score 3    ? Pain Location Back   ? Pain Orientation Lower   ? Pain Descriptors / Indicators Aching   ? Pain Type Chronic pain   ? Pain Radiating Towards low back   ? Pain Onset More than a month ago   ? Pain Frequency Constant   ? Aggravating Factors  prolonged sitting   ? Pain Relieving Factors heat/meds/e-stim   ? Effect of Pain on Daily Activities decreased sitting tolerance   ? Multiple Pain Sites No   ? ?  ?  ? ?  ? ?Occupational Therapy Treatment/Recertification: ?Therapeutic Exercise: ?Objective measures taken and goals updated.  OT provided handout for putty exercises and worked with pt to use L hand for setting up R hand digits to initiate the correct movements.  Pt required assist for positioning R thumb most often, and encouraged pt use L hand to keep R hand digits in place as needed so that fingers don't slip out of position.  Performed gross grasping, tip, lateral, and 3 point pinch, digit abd/add, ext, and encouraged pt to use pennies at home for digging out coins.  Made recommendation for  putty use 1-2x daily for 5-10 min per session.  Pt verbalized understanding.  ? ?Self Care: ?Worked with PenAgain and issued pen for home practice.  Pt was able to position PegAgain in hand and attempted writing name.  Pt wrote first 2 letters of his name with multiple attempts and great difficulty/poor legibility, and pt then stopped d/t frustration.  OT encouraged pt work on simply drawing lines on paper working to keep grip of pen.  Encouraged pt progress from scribbled lines to "x's" and "o's" to letters in name, and perform daily.  Pt verbalized understanding.  ? ?Response to Treatment: ?Pt with strong gains in R shoulder, elbow, and wrist strength in all planes.  Pt with noted 1# increase in R lateral pinch, but not yet able to engage R thumb for 3 point pinch or to pick up  pegs for 9 hole peg test.  R thumb struggles to stabilize and slips from prehension patterns noted above.  OT provided handout for theraputty exercises and practiced using L hand to stabilize R thumb for selected exercises.  Pt is starting to work with an adapted pen for handwriting.  Pt can hold pen in a loose tripod grasp, but not yet consistently forming letters to write name.  Pt will continue to benefit from skilled OT for maximizing R hand strength and coordination in order to effectively engage RUE into daily tasks.  ? ? ? OT Education - 08/10/21 1321   ? ? Education Details Theraputty exercises   ? Person(s) Educated Patient   ? Methods Explanation;Demonstration;Tactile cues;Verbal cues;Handout   ? Comprehension Verbalized understanding;Returned demonstration;Verbal cues required;Tactile cues required;Need further instruction   ? ?  ?  ? ?  ? ? ? OT Short Term Goals - 08/10/21 1330   ? ?  ? OT SHORT TERM GOAL #1  ? Title Pt. wil demonstrtae independence with HEPs   ? Baseline Eval: Pt. currently does not have one, 10th visit:  changes in HEP ongoing; 20th: instructed in putty exercises, changes ongoing   ? Time 6   ? Period Weeks   ? Status On-going   ? Target Date 09/20/21   ? ?  ?  ? ?  ? ? ? ? OT Long Term Goals - 08/10/21 1331   ? ?  ? OT LONG TERM GOAL #1  ? Title Pt. will improve RUE strength by 2 mm grades to assist with ADLs, and IADLs,   ? Baseline Eval: Right shoulder flexion, abduction 3/5, elbow flexion, extension wrist extension 3+/5, 10th visit: improving with RUE strength by not yet met goal; 20th visit: R shd flex/abd 4-, elbow flex/ext 4+, wrist flex 4-, wrist ext 4+   ? Time 12   ? Period Weeks   ? Status On-going   ? Target Date 11/01/21   ?  ? OT LONG TERM GOAL #2  ? Title Pt. will improve right grip by 10 lbs to prepare for firmly holding objects for IADLs.   ? Baseline Eval: R: 38#, L: 124# (In 3rd dynamometer slot); R 35# in 4th slot, 28# in 3rd slot   ? Time 12   ? Period Weeks   ?  Status On-going   ? Target Date 11/01/21   ?  ? OT LONG TERM GOAL #3  ? Title Pt. will improve right lateral pinch strength by 5# to assist with cutting food   ? Baseline Eval: R: 17, L: 25; 20th: R: 18   ? Time 12   ?  Period Weeks   ? Status On-going   ? Target Date 11/01/21   ?  ? OT LONG TERM GOAL #4  ? Title Pt. will improve Right 3pt pinch by 2# to be able to hold/open items for cooking   ? Baseline Eval: R: Pt. unable to engage thumb L: 29#; 20th: R unable   ? Time 12   ? Period Weeks   ? Status On-going   ? Target Date 11/01/21   ?  ? OT LONG TERM GOAL #5  ? Title Pt. will improve right hand [facility] skills to be able to independently manipulate buttons, and zippers.   ? Baseline Eval: Pt. has difficulty managing buttons,a nd zippers. 10th visit:  improving; 20th: unable to engage R hand effectively, performs 1 handed with L   ? Time 12   ? Period Weeks   ? Status On-going   ? Target Date 11/01/21   ?  ? OT LONG TERM GOAL #6  ? Title Pt. will independently write his name   ? Baseline Eval: Pt. is unable to hold a pen; 20th: pt can grip PenAgain with R hand and can make marks on paper with difficulty, but not yet writing name   ? Time 12   ? Period Weeks   ? Status On-going   ? Target Date 11/01/21   ?  ? OT LONG TERM GOAL #7  ? Title Pt. will improve FOTO score by 2 points to reflect funational improvement   ? Baseline Eval: FOTO score 46 with TR score 52; 20th: 45   ? Time 12   ? Period Weeks   ? Status On-going   ? Target Date 11/01/21   ? ?  ?  ? ?  ? ? Plan - 08/10/21 1357   ? ? Clinical Impression Statement Pt with strong gains in R shoulder, elbow, and wrist strength in all planes.  Pt with noted 1# increase in R lateral pinch, but not yet able to engage R thumb for 3 point pinch or to pick up pegs for 9 hole peg test.  R thumb struggles to stabilize and slips from prehension patterns noted above.  OT provided handout for theraputty exercises and practiced using L hand to stabilize R thumb for selected  exercises.  Pt is starting to work with an adapted pen for handwriting.  Pt can hold pen in a loose tripod grasp, but not yet consistently forming letters to write name.  Pt will continue to benefit from skill

## 2021-08-13 ENCOUNTER — Telehealth: Payer: Self-pay

## 2021-08-13 ENCOUNTER — Ambulatory Visit: Payer: Medicare HMO | Admitting: Occupational Therapy

## 2021-08-13 NOTE — Telephone Encounter (Signed)
Copied from CRM [id number]. Topic: Referral - Request for Referral ?>> Aug 13, 2021  8:43 AM McGill, Darlina Rumpf wrote: ?Has patient seen PCP for this complaint? Yes.   ?*If NO, is insurance requiring patient see PCP for this issue before PCP can refer them?Yes ?Referral for which specialty: Pain Management ?Preferred provider/office: [facility]/Address: [address] [place], [place] [address] ?Reason for referral: N/A ? ?Pt requesting a call back to follow up in regards to referral. ?

## 2021-08-14 ENCOUNTER — Ambulatory Visit: Payer: Medicare HMO

## 2021-08-14 ENCOUNTER — Ambulatory Visit: Payer: Medicare HMO | Admitting: Speech Pathology

## 2021-08-14 DIAGNOSIS — R482 Apraxia: Secondary | ICD-10-CM

## 2021-08-14 DIAGNOSIS — R278 Other lack of coordination: Secondary | ICD-10-CM

## 2021-08-14 DIAGNOSIS — M6281 Muscle weakness (generalized): Secondary | ICD-10-CM | POA: Diagnosis not present

## 2021-08-14 DIAGNOSIS — G8929 Other chronic pain: Secondary | ICD-10-CM

## 2021-08-14 DIAGNOSIS — R269 Unspecified abnormalities of gait and mobility: Secondary | ICD-10-CM | POA: Diagnosis not present

## 2021-08-14 DIAGNOSIS — R4701 Aphasia: Secondary | ICD-10-CM | POA: Diagnosis not present

## 2021-08-14 DIAGNOSIS — M545 Low back pain, unspecified: Secondary | ICD-10-CM | POA: Diagnosis not present

## 2021-08-14 DIAGNOSIS — R2681 Unsteadiness on feet: Secondary | ICD-10-CM | POA: Diagnosis not present

## 2021-08-14 DIAGNOSIS — R262 Difficulty in walking, not elsewhere classified: Secondary | ICD-10-CM | POA: Diagnosis not present

## 2021-08-14 DIAGNOSIS — Z8673 Personal history of transient ischemic attack (TIA), and cerebral infarction without residual deficits: Secondary | ICD-10-CM

## 2021-08-14 NOTE — Therapy (Signed)
?[facility] ?[place], [place], [address] ?Phone: [phone]   Fax:  [phone] ? ?Physical Therapy Treatment ? ?Patient Details  ?Name: David Reeves ?MRN: [id number] ?Date of Birth: 1970-05-25 ?Referring Provider (PT): [name] PA ? ? ?Encounter Date: 08/14/2021 ? ? PT End of Session - 08/14/21 1111   ? ? Visit Number 15   ? Number of Visits 17   ? Date for PT Re-Evaluation 08/21/21   ? Authorization Type [employer]- authorized 16 visits 4/4-6/1   ? Authorization - Visit Number 3   ? Authorization - Number of Visits 16   ? Progress Note Due on Visit 20   ? PT Start Time 1100   ? PT Stop Time 1140   ? PT Time Calculation (min) 40 min   ? Equipment Utilized During Treatment Gait belt   ? Activity Tolerance Patient tolerated treatment well;No increased pain   ? Behavior During Therapy [facility] for tasks assessed/performed   ? ?  ?  ? ?  ? ? ?Past Medical History:  ?Diagnosis Date  ? Back pain 04/22/2012  ? Bone spur   ? Bulging disc 04/22/2012  ? Degenerative disc disease   ? Osteoarthritis   ? Panic anxiety syndrome   ? Stroke [facility]) 10/2020  ? Taking multiple medications for chronic disease   ? ? ?Past Surgical History:  ?Procedure Laterality Date  ? CYSTECTOMY    ? head  ? HERNIA REPAIR Left 2006,2014  ? [facility]  ? TOE SURGERY Right 2007  ? ? ?There were no vitals filed for this visit. ? ? Subjective Assessment - 08/14/21 1107   ? ? Subjective Patient reports feeling about the same. Back is always sore and rarely goes lower than a 3/10. Rates at a 4/10 today.   ? Pertinent History Pt. is a 51 y.o. who was diagnosed with a CVA on July 21st, 2022. Pt. completed several weeks of  inpatient rehab at [facility]. After returning to home. He was discharged in late August/early September and recieved home health PT. Pt. sustained a fall in december of 2022, and was admitted to the hospital with COVID-19, and Chronic back pain. He reports chronic back  pain syndrome since 2012. Since the most recent discharge, Pt. has been residing with the ex-wife until the Pt. is ready to return to independent living. He did recieve 2 weeks of home health after discharge in December 2022. He is now being referred to outpatient PT to address weakness from stroke and improve fine motor movement. He reports rarely getting numbness/tingling in RLE. PMHx includes: Bilateral LBP with right sided sciatica, Lumbosacral spondylosis myelopathy, closed compression Fx of L1. Has chronic insomnia. osteoarthritis, Panic anxiety syndrome (taking medication) Pt. enjoys cooking, and riding motorcycles. Patient is going to [facility] for Epidural spinal injections on 06/07/21;   ? Currently in Pain? Yes   ? Pain Score 4    ? Pain Location Back   ? Pain Orientation Lower;Posterior   ? Pain Descriptors / Indicators Aching;Tightness;Sore   ? Pain Type Chronic pain   ? Pain Onset More than a month ago   ? Pain Frequency Constant   ? Aggravating Factors  Prolonged sitting   ? Pain Relieving Factors Heat/meds/e-stim   ? Effect of Pain on Daily Activities Decrease sitting   ? ?  ?  ? ?  ? ?INTERVENTIONS:  ? ?Manual therapy: Patient lying in supine with moist heat to  low back.  ?  ?Hamstring stretch (BLE) x 20 sec x 4 each leg.  ?Knee to chest (BLE) x 20 sec x 4 each leg ?Manual hip circles (CW/CCW) x 10 reps each direction and each LE ?Pirifomis x 30 sec x 3 sets each LE ?  ? ?Therapeutic Exercises:  ? ?Sidelye Hip abd GTB at ankles 2 sets of 12 reps each LE ? ?Bridging x 10 reps  ? ?Bridging with SLR x 10 reps ? ? ? ?Sidelye hip ext GTB x 12 reps RLE x 2 sets ?Sidelye ham curl GTB x 12 reps RLE x 2 sets ? ? ?Neuromuscular Re-ed: ? ?Tandem stand- Hold 30 sec x 2 each Side (increased difficulty with right LE as back leg)  ?Tandem walk- approx 8 feet x 5 trials- increased difficulty without any UE support.  ?SLS- attempted on both legs- Able to perform 10+ sec on left LE yet only around 6 sec max on  right LE with increased unsteadiness.  ? ?Education provided throughout session via VC/TC and demonstration to facilitate movement at target joints and correct muscle activation for all testing and exercises performed.  ? ? ? ? ?Education provided throughout session via VC/TC and demonstration to facilitate movement at target joints and correct muscle activation for all testing and exercises performed.  ? ? ? ? ? ? ? ? ? ? ? ? ? PT Education - 08/14/21 1110   ? ? Education Details Exercise technique   ? Person(s) Educated Patient   ? Methods Explanation;Demonstration;Tactile cues;Verbal cues   ? Comprehension Verbalized understanding;Returned demonstration;Verbal cues required;Tactile cues required;Need further instruction   ? ?  ?  ? ?  ? ? ? PT Short Term Goals - 07/24/21 1152   ? ?  ? PT SHORT TERM GOAL #1  ? Title Patient will be adherent to HEP at least 3x a week to improve functional strength and balance for better safety at home.   ? Baseline 4/4: doing them 1-2x a week   ? Time 4   ? Period Weeks   ? Status Partially Met   ? Target Date 08/21/21   ?  ? PT SHORT TERM GOAL #2  ? Title Patient (< 73 years old) will complete five times sit to stand test in < 15 seconds indicating an increased LE strength and improved balance.   ? Baseline 4/4: 17.85 sec with arms across chest   ? Time 4   ? Period Weeks   ? Status Partially Met   ? Target Date 08/21/21   ? ?  ?  ? ?  ? ? ? ? PT Long Term Goals - 07/24/21 1215   ? ?  ? PT LONG TERM GOAL #1  ? Title Patient will increase six minute walk test distance to >1000 for progression to community ambulator and improve gait ability   ? Baseline 4/4: 1115 feet   ? Time 4   ? Period Weeks   ? Status Achieved   ? Target Date 08/21/21   ?  ? PT LONG TERM GOAL #2  ? Title Patient will ascend/descend 8 stairs without rail assist independently without loss of balance to improve ability to get in/out of home.   ? Baseline 4/4: requires 1 rail assist   ? Time 4   ? Period Weeks   ?  Status Partially Met   ? Target Date 08/21/21   ?  ? PT LONG TERM GOAL #3  ? Title Patient will  increase BLE gross strength to 4+/5 as to improve functional strength for independent gait, increased standing tolerance and increased ADL ability.   ? Baseline 4/4: not formally assessed;   ? Time 4   ? Period Weeks   ? Status Partially Met   ? Target Date 08/21/21   ?  ? PT LONG TERM GOAL #4  ? Title Patient will improve FOTO score to >50% to indicate improved functional mobility with less pain with ADLs.   ? Baseline 4/4: 35%   ? Time 4   ? Period Weeks   ? Status Not Met   ? Target Date 08/21/21   ?  ? PT LONG TERM GOAL #5  ? Title Patient will report a worst pain of 4/10 in low back over last week to indicate improved tolerance with ADLs.   ? Baseline 4/4: 7/10   ? Time 4   ? Period Weeks   ? Status Not Met   ? Target Date 08/21/21   ? ?  ?  ? ?  ? ? ? ? ? ? ? ? Plan - 08/14/21 1111   ? ? Clinical Impression Statement Patient presented with initial increased stiffness in low back and right LE- improved flexibility with stretching and able to progress his strength and later balance today. He performed well demo increased weakness with involved right LE yet did improve with practice and was challenged today with standing balance exhibiting increased difficulty performing SLS on right LE and tandem with right LE in back position.  Patient will continue to benefit from skilled physical therapy intervention in order to improve lower extremity strength, improve balance, and improve his low back pain and discomfort.   ? Personal Factors and Comorbidities Comorbidity 3+;Past/Current Experience;Time since onset of injury/illness/exacerbation   ? Comorbidities PMHx includes: Bilateral LBP with right sided sciatica, Lumbosacral spondylosis myelopathy, closed compression Fx of L1. Has chronic insomnia. osteoarthritis, Panic anxiety syndrome (taking medication)   ? Examination-Activity Limitations Bend;Carry;Locomotion  Level;Sit;Squat;Stairs;Stand;Transfers   ? Examination-Participation Restrictions Cleaning;Community Activity;Driving;Laundry;Meal Prep;Occupation;Shop;Volunteer;[employer] Work   ? Stability/Clinical Decision [employer]

## 2021-08-14 NOTE — Therapy (Signed)
[place] ?[facility] ?[place], [place], [address] ?Phone: [phone]   Fax:  [phone] ? ?Occupational Therapy Treatment ? ?Patient Details  ?Name: David Reeves ?MRN: [id number] ?Date of Birth: March 05, 1971 ?Referring Provider (OT): [name] ? ? ?Encounter Date: 08/14/2021 ? ? OT End of Session - 08/14/21 2133   ? ? Visit Number 21   ? Number of Visits 44   ? Date for OT Re-Evaluation 11/01/21   ? Authorization Type Progress reporting period starting 08/10/21   ? OT Start Time 0900   ? OT Stop Time 9675   ? OT Time Calculation (min) 45 min   ? Activity Tolerance Patient tolerated treatment well   ? Behavior During Therapy [facility] for tasks assessed/performed   ? ?  ?  ? ?  ? ? ?Past Medical History:  ?Diagnosis Date  ? Back pain 04/22/2012  ? Bone spur   ? Bulging disc 04/22/2012  ? Degenerative disc disease   ? Osteoarthritis   ? Panic anxiety syndrome   ? Stroke [facility]) 10/2020  ? Taking multiple medications for chronic disease   ? ? ?Past Surgical History:  ?Procedure Laterality Date  ? CYSTECTOMY    ? head  ? HERNIA REPAIR Left 2006,2014  ? [facility]  ? TOE SURGERY Right 2007  ? ? ?There were no vitals filed for this visit. ? ? Subjective Assessment - 08/13/21 2131   ? ? Subjective  "I'm here."   ? Patient is accompanied by: Family member   ? Pertinent History Pt. is a 51 y.o. who was diagnosed with a CVA on July 21st, 2022. Pt. completed several weeks of  inpatient rehab at [facility]. After returning to home, Pt. sustained a fall in december of 2022, and was admitted to the hospital with COVI-19, and back pain from the fall. Since the most recent discharge, Pt. has been residing with the ex-wife until the Pt. is ready to return to independent living. Pt. PMHx includes: Bilateral LBP with right sided sciatica, Lumbosacral spondylosis myelopathy, closed compression Fx of L1. Pt. enjoys cooking, and riding motorcycles.   ? Patient Stated Goals Pt would like  to be as independent as he was before, improve hand function.   ? Currently in Pain? Yes   ? Pain Score 4    ? Pain Location Back   ? Pain Orientation Lower   ? Pain Descriptors / Indicators Aching;Tightness;Sore   ? Pain Type Chronic pain   ? Pain Radiating Towards low back   ? Pain Onset More than a month ago   ? Pain Frequency Constant   ? Aggravating Factors  prolonged sitting   ? Pain Relieving Factors heat/meds/e-stim   ? Effect of Pain on Daily Activities decreased sitting tolerance   ? Multiple Pain Sites No   ? ?  ?  ? ?  ? ?Occupational Therapy Treatment: ?Therapeutic Exercise: ?Facilitated pinch strengthening with use of therapy resistant clothespins to target lateral pinch in R hand.  Worked with light resistance pins only, including yellow and red, and assisted pt with hand over hand to clip and unclip pins from yardstick in order to maintain prehension and isolate correct thumb and finger movements.  Completed 2 trials with yellow and red. ? ?Neuro re-ed: ?Secured R hand digits to palm to practice isolating thumb for flex/ext to promote lateral pinch.  Pt required active assist for thumb extension and min A during flexion to keep thumb from slipping  initially.  With repetition OT was able to decrease assist in both directions.  Instructed pt in additional digit isolation exercises, including finger taps for each digit; OT providing initial forceful hold to keep other digits from tapping.  With repetition, pt was able to tap a single digit with a light hold for the surrounding digits.  Performed active assisted digit abd/add, with manual assist for maintaining flat palm to reduce digit flexion, and assist to achieve end range.  Cued for slow speed for better motor control.  Performed slow paced active assisted digit flex/ext, requiring manual assist for full extension of digits 1-3; range limited by flexor tone and apraxia, and guidance of R thumb around digits to make a full fist.  R thumb tends to  flex first under digits 2-5, but able to flex overtop to form full fist with repetition and tactile and vc.  Intermittent rest, WB with hand on table top, and passive wrist and digit extension stretch to reduce flexor tone in hand.   ? ?Response to Treatment: ?Pt worked with yellow and red clothespins with hand over hand assist to maintain prehension and isolate correct thumb and finger movements.  Pt practiced digit isolation exercises with manual assist to stabilize surrounding digits and minimize flexor tone in digits.  Pt frustrates easily but responds well to positive reinforcement, and was pleased to see improvement with digit isolation with repetition throughout session.  Pt will continue to benefit from skilled OT for maximizing strength and coordination throughout RUE to better engage dominant arm during daily tasks.  ? ? ? ? OT Education - 08/13/21 2132   ? ? Education Details digit isolation exercises   ? Person(s) Educated Patient   ? Methods Explanation;Demonstration;Tactile cues;Verbal cues   ? Comprehension Verbalized understanding;Returned demonstration;Verbal cues required;Tactile cues required;Need further instruction   ? ?  ?  ? ?  ? ? ? OT Short Term Goals - 08/10/21 1330   ? ?  ? OT SHORT TERM GOAL #1  ? Title Pt. wil demonstrtae independence with HEPs   ? Baseline Eval: Pt. currently does not have one, 10th visit:  changes in HEP ongoing; 20th: instructed in putty exercises, changes ongoing   ? Time 6   ? Period Weeks   ? Status On-going   ? Target Date 09/20/21   ? ?  ?  ? ?  ? ? ? ? OT Long Term Goals - 08/10/21 1331   ? ?  ? OT LONG TERM GOAL #1  ? Title Pt. will improve RUE strength by 2 mm grades to assist with ADLs, and IADLs,   ? Baseline Eval: Right shoulder flexion, abduction 3/5, elbow flexion, extension wrist extension 3+/5, 10th visit: improving with RUE strength by not yet met goal; 20th visit: R shd flex/abd 4-, elbow flex/ext 4+, wrist flex 4-, wrist ext 4+   ? Time 12   ? Period  Weeks   ? Status On-going   ? Target Date 11/01/21   ?  ? OT LONG TERM GOAL #2  ? Title Pt. will improve right grip by 10 lbs to prepare for firmly holding objects for IADLs.   ? Baseline Eval: R: 38#, L: 124# (In 3rd dynamometer slot); R 35# in 4th slot, 28# in 3rd slot   ? Time 12   ? Period Weeks   ? Status On-going   ? Target Date 11/01/21   ?  ? OT LONG TERM GOAL #3  ? Title Pt. will improve  right lateral pinch strength by 5# to assist with cutting food   ? Baseline Eval: R: 17, L: 25; 20th: R: 18   ? Time 12   ? Period Weeks   ? Status On-going   ? Target Date 11/01/21   ?  ? OT LONG TERM GOAL #4  ? Title Pt. will improve Right 3pt pinch by 2# to be able to hold/open items for cooking   ? Baseline Eval: R: Pt. unable to engage thumb L: 29#; 20th: R unable   ? Time 12   ? Period Weeks   ? Status On-going   ? Target Date 11/01/21   ?  ? OT LONG TERM GOAL #5  ? Title Pt. will improve right hand [facility] skills to be able to independently manipulate buttons, and zippers.   ? Baseline Eval: Pt. has difficulty managing buttons,a nd zippers. 10th visit:  improving; 20th: unable to engage R hand effectively, performs 1 handed with L   ? Time 12   ? Period Weeks   ? Status On-going   ? Target Date 11/01/21   ?  ? OT LONG TERM GOAL #6  ? Title Pt. will independently write his name   ? Baseline Eval: Pt. is unable to hold a pen; 20th: pt can grip PenAgain with R hand and can make marks on paper with difficulty, but not yet writing name   ? Time 12   ? Period Weeks   ? Status On-going   ? Target Date 11/01/21   ?  ? OT LONG TERM GOAL #7  ? Title Pt. will improve FOTO score by 2 points to reflect funational improvement   ? Baseline Eval: FOTO score 46 with TR score 52; 20th: 45   ? Time 12   ? Period Weeks   ? Status On-going   ? Target Date 11/01/21   ? ?  ?  ? ?  ? ? ? ? Plan - 08/14/21 2210   ? ? Clinical Impression Statement Pt worked with yellow and red clothespins with hand over hand assist to maintain prehension and  isolate correct thumb and finger movements.  Pt practiced digit isolation exercises with manual assist to stabilize surrounding digits and minimize flexor tone in digits.  Pt frustrates easily but responds

## 2021-08-15 ENCOUNTER — Telehealth: Payer: Self-pay | Admitting: *Deleted

## 2021-08-15 ENCOUNTER — Ambulatory Visit: Payer: Medicare HMO | Admitting: Occupational Therapy

## 2021-08-15 ENCOUNTER — Other Ambulatory Visit: Payer: Self-pay | Admitting: Physician Assistant

## 2021-08-15 DIAGNOSIS — G8929 Other chronic pain: Secondary | ICD-10-CM

## 2021-08-15 DIAGNOSIS — I6930 Unspecified sequelae of cerebral infarction: Secondary | ICD-10-CM

## 2021-08-15 NOTE — Therapy (Signed)
[place] ?[facility] ?[address] ?[place], [place], [address] ?Phone: [phone]   Fax:  [phone] ? ?Speech Language Pathology Treatment ? ?Patient Details  ?Name: David Reeves ?MRN: [id number] ?Date of Birth: [birth year] ?Referring Provider (SLP): Alfredia Ferguson ? ? ?Encounter Date: 08/14/2021 ? ? End of Session - 08/15/21 1902   ? ? Visit Number 13   ? Number of Visits 25   ? Date for SLP Re-Evaluation 08/27/21   ? Authorization Type [employer] Medicare HMO   ? Authorization Time Period 06/04/2021 thru 08/27/2021   ? Authorization - Visit Number 3   ? Progress Note Due on Visit 10   ? SLP Start Time 1000   ? SLP Stop Time  1100   ? SLP Time Calculation (min) 60 min   ? Activity Tolerance Patient tolerated treatment well   ? ?  ?  ? ?  ? ? ?Past Medical History:  ?Diagnosis Date  ? Back pain 04/22/2012  ? Bone spur   ? Bulging disc 04/22/2012  ? Degenerative disc disease   ? Osteoarthritis   ? Panic anxiety syndrome   ? Stroke [facility]) 10/2020  ? Taking multiple medications for chronic disease   ? ? ?Past Surgical History:  ?Procedure Laterality Date  ? CYSTECTOMY    ? head  ? HERNIA REPAIR Left 2006,2014  ? [facility]  ? TOE SURGERY Right 2007  ? ? ?There were no vitals filed for this visit. ? ? Subjective Assessment - 08/15/21 1854   ? ? Subjective Patient pleasant and motivated for session   ? Currently in Pain? No/denies   ? ?  ?  ? ?  ? ? ? ? ? ? ? ? ADULT SLP TREATMENT - 08/15/21 0001   ? ?  ? Treatment Provided  ? Treatment provided Cognitive-Linquistic   ?  ? Cognitive-Linquistic Treatment  ? Treatment focused on Apraxia;Patient/family/caregiver education   ? Skilled Treatment Skilled treatment session focused on improving pt's speech intelligibility. SLP facilitated session by providing maximal visual and tactile pacing when reading at the phrase level. SLP provided pt with phrase level info regarding motorcycles. Pt required continued maximal cues to slow rate  of speech to increase speech intelligiblity from 50% to 85%. Visual barriers of presenting one phrase at a time was helpful.   ? ?  ?  ? ?  ? ? ? SLP Education - 08/15/21 1901   ? ? Education Details functional speech intelligibility using slower than desired rate of speech   ? Person(s) Educated Patient   ? Methods Explanation;Demonstration;Verbal cues;Tactile cues   ? Comprehension Verbalized understanding;Returned demonstration;Need further instruction;Verbal cues required;Tactile cues required   ? ?  ?  ? ?  ? ? ? SLP Short Term Goals - 07/28/21 1830   ? ?  ? SLP SHORT TERM GOAL #1  ? Title Pt will use the speech intelligibility strategy of pacing to slow rate of speech at the simple sentence level in 8 out of 10 opportunities.   ? Time 10   ? Period --   sessions  ? Status On-going   ?  ? SLP SHORT TERM GOAL #2  ? Title Pt will produce fricatives in the initial, final and medial positions of words with 90% accuracy.   ? Time 10   ? Period --   sessions  ? Status On-going   ?  ? SLP SHORT TERM GOAL #3  ? Title Pt will use speech  intelligibility strategy of overarticulation to improve speech intelligibility to 75% at the simple sentence level.   ? Time 10   ? Period --   sessions  ?  ? SLP SHORT TERM GOAL #4  ? Title Pt will answer WH questions after reading paragraph level passage with 75% accuracy.   ? Time 10   ? Period --   sessions  ? Status On-going   ? ?  ?  ? ?  ? ? ? SLP Long Term Goals - 07/28/21 1831   ? ?  ? SLP LONG TERM GOAL #1  ? Title Pt will use speech intelligibility stategies to achieve 50% speech intelligibility at the sentence level.   ? Status On-going   ? Target Date 08/27/21   ? ?  ?  ? ?  ? ? ? Plan - 08/15/21 1902   ? ? 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 Intervention targeting strategy use for improved intelligiblity with good progress though patient continues to  benefit from verbal cues for which strategy is required when and carrying over independently vs with reduced interfering external aids. As a result, skilled ST intervention is required to instruct pt in speech intelligibility strategies to increase functional independence and reduce caregiver burden.   ? Speech Therapy Frequency 2x / week   ? Duration 12 weeks   ? Treatment/Interventions Language facilitation;Compensatory techniques;Cueing hierarchy;Internal/external aids;SLP instruction and feedback;Patient/family education;Compensatory strategies   ? Potential to Achieve Goals Good   ? SLP Home Exercise Plan provided, see pt instructions section   ? Consulted and Agree with Plan of Care Patient   ? ?  ?  ? ?  ? ? ?Patient will benefit from skilled therapeutic intervention in order to improve the following deficits and impairments:   ?Apraxia ? ?H/O ischemic left MCA stroke ? ?H/O ischemic left ACA stroke ? ? ? ?Problem List ?Patient Active Problem List  ? Diagnosis Date Noted  ? Alterations of sensations following cerebrovascular accident 03/08/2021  ? Aphasia as late effect of cerebrovascular accident (CVA) 03/08/2021  ? Muscle spasm 02/22/2021  ? Acute non-recurrent frontal sinusitis 02/22/2021  ? Chronic pain syndrome 01/23/2021  ? Primary hypertension 01/23/2021  ? Nerve pain 01/23/2021  ? Erectile dysfunction due to diseases classified elsewhere 01/23/2021  ? Cognitive dysfunction 01/23/2021  ? Scrotal edema 01/23/2021  ? Elevated LDL cholesterol level 01/23/2021  ? History of stroke with residual deficit 01/23/2021  ? Primary insomnia 01/23/2021  ? Mouth pain 01/23/2021  ? Laceration of right hand without foreign body   ? Closed compression fracture of L1 vertebra (HCC) 05/24/2016  ? Lumbar stenosis with neurogenic claudication 11/01/2015  ? Chronic 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 11/08/2014  ? Hx of hemorrhoids 11/08/2014  ? AAA (abdominal aortic aneurysm) (HCC) 09/22/2014  ? Chronic pain  associated with significant psychosocial dysfunction 09/22/2014  ? Panic disorder 09/22/2014  ? AB (asthmatic bronchitis) 08/17/2014  ? Anxiety 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 08/17/2014  ? Backache 08/17/2014  ? Lumbosacral spondylosis without myelopathy 08/17/2014  ? Disorder of male genital organs 08/17/2014  ? Brash 08/17/2014  ? Low back pain 08/17/2014  ? Tendon nodule 08/17/2014  ? Episodic paroxysmal anxiety disorder 08/17/2014  ? Hernia, inguinal, right 08/17/2014  ? Fast heart beat 08/17/2014  ? Illness 08/17/2014  ? Inguinal hernia 10/14/2012  ? ?Fayrene Towner B. Dreama Saaverton, M.S., CCC-SLP, CBIS ?Speech-Language  Pathologist ?Rehabilitation Services ?Office [phone] ? ?Cornie Mccomber, CCC-SLP ?08/15/2021, 7:04 PM ? ?[place] ?[facility] ?[address] ?[place], [place], [address] ?Phone: [phone]   Fax:  [phone] ? ? ?Name: David Reeves ?MRN: [id number] ?Date of Birth: [birth year] ? ?

## 2021-08-15 NOTE — Progress Notes (Signed)
Dr. Ronita Hipps - [address], [place], [place] [address] [phone] ?

## 2021-08-15 NOTE — Patient Instructions (Signed)
Read provided information about the [employer] (pt's personal bike) ?

## 2021-08-15 NOTE — Telephone Encounter (Signed)
Copied from CRM [id number]. Topic: Referral - Request for Referral ?>> Aug 15, 2021  8:27 AM Traci Sermon wrote: ?Has patient seen PCP for this complaint? Yes.   ? ?Referral for which specialty: Pain Management ? ?Preferred provider/office: Dr. Ronita Hipps - [address], [place], [place] [address] [phone] ? ?Reason for referral: Pain ?

## 2021-08-16 DIAGNOSIS — I69398 Other sequelae of cerebral infarction: Secondary | ICD-10-CM | POA: Diagnosis not present

## 2021-08-16 DIAGNOSIS — Z789 Other specified health status: Secondary | ICD-10-CM | POA: Diagnosis not present

## 2021-08-16 DIAGNOSIS — I69320 Aphasia following cerebral infarction: Secondary | ICD-10-CM | POA: Diagnosis not present

## 2021-08-17 ENCOUNTER — Ambulatory Visit: Payer: Medicare HMO

## 2021-08-17 ENCOUNTER — Ambulatory Visit: Payer: Medicare HMO | Admitting: Speech Pathology

## 2021-08-17 DIAGNOSIS — R262 Difficulty in walking, not elsewhere classified: Secondary | ICD-10-CM

## 2021-08-17 DIAGNOSIS — M6281 Muscle weakness (generalized): Secondary | ICD-10-CM

## 2021-08-17 DIAGNOSIS — M545 Low back pain, unspecified: Secondary | ICD-10-CM | POA: Diagnosis not present

## 2021-08-17 DIAGNOSIS — Z8673 Personal history of transient ischemic attack (TIA), and cerebral infarction without residual deficits: Secondary | ICD-10-CM

## 2021-08-17 DIAGNOSIS — R482 Apraxia: Secondary | ICD-10-CM

## 2021-08-17 DIAGNOSIS — R2681 Unsteadiness on feet: Secondary | ICD-10-CM | POA: Diagnosis not present

## 2021-08-17 DIAGNOSIS — R2689 Other abnormalities of gait and mobility: Secondary | ICD-10-CM

## 2021-08-17 DIAGNOSIS — R4701 Aphasia: Secondary | ICD-10-CM | POA: Diagnosis not present

## 2021-08-17 DIAGNOSIS — R278 Other lack of coordination: Secondary | ICD-10-CM

## 2021-08-17 DIAGNOSIS — R269 Unspecified abnormalities of gait and mobility: Secondary | ICD-10-CM

## 2021-08-17 NOTE — Therapy (Signed)
[place] [facility] ?[place], [place], [address] ?Phone: [phone]   Fax:  [phone] ? ?Occupational Therapy Treatment ? ?Patient Details  ?Name: David Reeves ?MRN: [id number] ?Date of Birth: 08-Apr-1971 ?Referring Provider (OT): [name] ? ? ?Encounter Date: 08/17/2021 ? ? OT End of Session - 08/17/21 1753   ? ? Visit Number 22   ? Number of Visits 44   ? Date for OT Re-Evaluation 11/01/21   ? Authorization Type Progress reporting period starting 08/10/21   ? OT Start Time 1100   ? OT Stop Time 1145   ? OT Time Calculation (min) 45 min   ? Activity Tolerance Patient tolerated treatment well   ? Behavior During Therapy [facility] for tasks assessed/performed   ? ?  ?  ? ?  ? ? ?Past Medical History:  ?Diagnosis Date  ? Back pain 04/22/2012  ? Bone spur   ? Bulging disc 04/22/2012  ? Degenerative disc disease   ? Osteoarthritis   ? Panic anxiety syndrome   ? Stroke [facility]) 10/2020  ? Taking multiple medications for chronic disease   ? ? ?Past Surgical History:  ?Procedure Laterality Date  ? CYSTECTOMY    ? head  ? HERNIA REPAIR Left 2006,2014  ? [facility]  ? TOE SURGERY Right 2007  ? ? ?There were no vitals filed for this visit. ? ? Subjective Assessment - 08/16/21 1751   ? ? Subjective  Pt reports his back is really hurting today, but better after using heat during SLP session and working with PT.   ? Patient is accompanied by: Family member   ? Pertinent History Pt. is a 51 y.o. who was diagnosed with a CVA on July 21st, 2022. Pt. completed several weeks of  inpatient rehab at [facility]. After returning to home, Pt. sustained a fall in december of 2022, and was admitted to the hospital with COVI-19, and back pain from the fall. Since the most recent discharge, Pt. has been residing with the ex-wife until the Pt. is ready to return to independent living. Pt. PMHx includes: Bilateral LBP with right sided sciatica, Lumbosacral spondylosis myelopathy, closed  compression Fx of L1. Pt. enjoys cooking, and riding motorcycles.   ? Patient Stated Goals Pt would like to be as independent as he was before, improve hand function.   ? Currently in Pain? Yes   ? Pain Score 3    ? Pain Location Back   ? Pain Orientation Lower   ? Pain Descriptors / Indicators Aching;Tightness;Sore   ? Pain Type Chronic pain   ? Pain Radiating Towards low back   ? Pain Onset More than a month ago   ? Pain Frequency Constant   ? Aggravating Factors  prolonged sitting   ? Pain Relieving Factors heat/meds/e-stim   ? Effect of Pain on Daily Activities decreased sitting tolerance   ? Multiple Pain Sites No   ? ?  ?  ? ?  ? ?Occupational Therapy Treatment: ?Neuro re-ed: ?Secured R hand digits to palm to practice isolating thumb for flex/ext to promote lateral pinch.  Worked to pick up circular blocks and discard into bucket with hand in this position (digits secured) to target thumb flex/ext and abd/add with block manipulation.  OT provided set up of block between thumb and digits d/t decreased active thumb abd and ext.  Pt was able to keep lateral pinch to secure block before dropping into bucket with only 2 dropped  blocks.  Reviewed/completed digit isolation exercises, including finger taps for each digit, full hand taps x3 sets 10 reps each; OT providing support to dorsum of R hand and digits to keep other digits from tapping during single digit taps.  Performed active assisted digit abd/add, with manual assist for maintaining flat palm to reduce digit flexion, and assist to achieve end range.  Cued for slow speed for better motor control.  Intermittent rest, WB with hand on table top, and passive wrist and digit extension stretch to reduce flexor tone in hand between activities and reps.   ? ?Therapeutic Exercise: ?Worked with [place] board for simulation of turning knobs mod-max A, cranking a handle requiring wrist flex/ext with sustained grip (mod A), and pulling small block from velcro (multiple trials  and mod vc for finger position).  ?  ?Response to Treatment: ?Pt focus on digit isolation for object manipulation.  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  Pt requires intermittent tactile and vc to stabilize R elbow and forearm when working on functional tasks with R hand.  Pt will continue to benefit from skilled OT for maximizing strength and coordination throughout RUE to better engage dominant arm during daily tasks.  ? ? ? OT Education - 08/16/21 1752   ? ? Education Details digit isolation exercises   ? Person(s) Educated Patient   ? Methods Explanation;Demonstration;Tactile cues;Verbal cues   ? Comprehension Verbalized understanding;Returned demonstration;Verbal cues required;Tactile cues required;Need further instruction   ? ?  ?  ? ?  ? ? ? OT Short Term Goals - 08/10/21 1330   ? ?  ? OT SHORT TERM GOAL #1  ? Title Pt. wil demonstrtae independence with HEPs   ? Baseline Eval: Pt. currently does not have one, 10th visit:  changes in HEP ongoing; 20th: instructed in putty exercises, changes ongoing   ? Time 6   ? Period Weeks   ? Status On-going   ? Target Date 09/20/21   ? ?  ?  ? ?  ? ? ? ? OT Long Term Goals - 08/10/21 1331   ? ?  ? OT LONG TERM GOAL #1  ? Title Pt. will improve RUE strength by 2 mm grades to assist with ADLs, and IADLs,   ? Baseline Eval: Right shoulder flexion, abduction 3/5, elbow flexion, extension wrist extension 3+/5, 10th visit: improving with RUE strength by not yet met goal; 20th visit: R shd flex/abd 4-, elbow flex/ext 4+, wrist flex 4-, wrist ext 4+   ? Time 12   ? Period Weeks   ? Status On-going   ? Target Date 11/01/21   ?  ? OT LONG TERM GOAL #2  ? Title Pt. will improve right grip by 10 lbs to prepare for firmly holding objects for IADLs.   ? Baseline Eval: R: 38#, L: 124# (In 3rd dynamometer slot); R 35# in 4th slot, 28#  in 3rd slot   ? Time 12   ? Period Weeks   ? Status On-going   ? Target Date 11/01/21   ?  ? OT LONG TERM GOAL #3  ? Title Pt. will improve right lateral pinch strength by 5# to assist with cutting food   ? Baseline Eval: R: 17, L: 25; 20th: R: 18   ? Time 12   ? Period Weeks   ? Status On-going   ?  Target Date 11/01/21   ?  ? OT LONG TERM GOAL #4  ? Title Pt. will improve Right 3pt pinch by 2# to be able to hold/open items for cooking   ? Baseline Eval: R: Pt. unable to engage thumb L: 29#; 20th: R unable   ? Time 12   ? Period Weeks   ? Status On-going   ? Target Date 11/01/21   ?  ? OT LONG TERM GOAL #5  ? Title Pt. will improve right hand [facility] skills to be able to independently manipulate buttons, and zippers.   ? Baseline Eval: Pt. has difficulty managing buttons,a nd zippers. 10th visit:  improving; 20th: unable to engage R hand effectively, performs 1 handed with L   ? Time 12   ? Period Weeks   ? Status On-going   ? Target Date 11/01/21   ?  ? OT LONG TERM GOAL #6  ? Title Pt. will independently write his name   ? Baseline Eval: Pt. is unable to hold a pen; 20th: pt can grip PenAgain with R hand and can make marks on paper with difficulty, but not yet writing name   ? Time 12   ? Period Weeks   ? Status On-going   ? Target Date 11/01/21   ?  ? OT LONG TERM GOAL #7  ? Title Pt. will improve FOTO score by 2 points to reflect funational improvement   ? Baseline Eval: FOTO score 46 with TR score 52; 20th: 45   ? Time 12   ? Period Weeks   ? Status On-going   ? Target Date 11/01/21   ? ?  ?  ? ?  ? ? ? Plan - 08/16/21 1810   ? ? Clinical Impression Statement Pt focus on digit isolation for object manipulation.  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  Pt requires intermittent tactile and vc to stabilize R elbow and forearm when working on functional tasks with R  hand.  Pt will continue to benefit from skilled OT for maximizing strength and coordination throughout RUE to better engage dominant arm during daily tasks.   ? OT Occupational Profile and History Detaile

## 2021-08-17 NOTE — Therapy (Signed)
[place] ?[facility] ?[address] ?[place], [place], [address] ?Phone: [phone]   Fax:  [phone] ? ?Speech Language Pathology Treatment ? ?Patient Details  ?Name: David Reeves ?MRN: [id number] ?Date of Birth: [birth year] ?Referring Provider (SLP): Alfredia Ferguson ? ? ?Encounter Date: 08/17/2021 ? ? End of Session - 08/17/21 2121   ? ? Visit Number 14   ? Number of Visits 25   ? Date for SLP Re-Evaluation 08/27/21   ? Authorization Type [employer] Medicare HMO   ? Authorization Time Period 06/04/2021 thru 08/27/2021   ? Authorization - Visit Number 4   ? Progress Note Due on Visit 10   ? SLP Start Time 0900   ? SLP Stop Time  1000   ? SLP Time Calculation (min) 60 min   ? Activity Tolerance Patient tolerated treatment well   ? ?  ?  ? ?  ? ? ?Past Medical History:  ?Diagnosis Date  ? Back pain 04/22/2012  ? Bone spur   ? Bulging disc 04/22/2012  ? Degenerative disc disease   ? Osteoarthritis   ? Panic anxiety syndrome   ? Stroke [facility]) 10/2020  ? Taking multiple medications for chronic disease   ? ? ?Past Surgical History:  ?Procedure Laterality Date  ? CYSTECTOMY    ? head  ? HERNIA REPAIR Left 2006,2014  ? [facility]  ? TOE SURGERY Right 2007  ? ? ?There were no vitals filed for this visit. ? ? Subjective Assessment - 08/17/21 2118   ? ? Subjective Pt in a 10 out of 10 whole body pain d/t change in atmospheric pressure with rain today   ? Currently in Pain? Yes   ? Pain Score 10-Worst pain ever   ? Pain Location Back   ? Pain Orientation Lower   ? Pain Descriptors / Indicators Aching;Tightness;Sore   ? Pain Type Chronic pain   ? Pain Onset More than a month ago   ? Pain Frequency Constant   ? Multiple Pain Sites No   ? ?  ?  ? ?  ? ? ? ? ? ? ? ? ADULT SLP TREATMENT - 08/17/21 0001   ? ?  ? Treatment Provided  ? Treatment provided Cognitive-Linquistic   ?  ? Cognitive-Linquistic Treatment  ? Treatment focused on Apraxia;Patient/family/caregiver education   ? Skilled  Treatment Skilled treatment session focused on pt's speech intelligibility specifically SLP facilitated session by providing the following interventions:    Use of speech intelligibility strategies: Speech intelligibility greatly reduced as a result of his pain. In addition to pt's apraxia of speech, he spoke at suboptimal vocal intensity, imprecise articulation (much reduced), consonant cluster reduction resulting in < 50% speech intelligibility at the sentence level with continued cues to repeat. Pt was Mod I with use of pacing board but unfortunately, pacing didn't help increase his speech intelligibility today.   ? ?  ?  ? ?  ? ? ? SLP Education - 08/17/21 2119   ? ? Education Details provided   ? Person(s) Educated Patient   ? Methods Explanation   ? Comprehension Verbalized understanding   ? ?  ?  ? ?  ? ? ? SLP Short Term Goals - 07/28/21 1830   ? ?  ? SLP SHORT TERM GOAL #1  ? Title Pt will use the speech intelligibility strategy of pacing to slow rate of speech at the simple sentence level in 8 out of 10 opportunities.   ?  Time 10   ? Period --   sessions  ? Status On-going   ?  ? SLP SHORT TERM GOAL #2  ? Title Pt will produce fricatives in the initial, final and medial positions of words with 90% accuracy.   ? Time 10   ? Period --   sessions  ? Status On-going   ?  ? SLP SHORT TERM GOAL #3  ? Title Pt will use speech intelligibility strategy of overarticulation to improve speech intelligibility to 75% at the simple sentence level.   ? Time 10   ? Period --   sessions  ?  ? SLP SHORT TERM GOAL #4  ? Title Pt will answer WH questions after reading paragraph level passage with 75% accuracy.   ? Time 10   ? Period --   sessions  ? Status On-going   ? ?  ?  ? ?  ? ? ? SLP Long Term Goals - 07/28/21 1831   ? ?  ? SLP LONG TERM GOAL #1  ? Title Pt will use speech intelligibility stategies to achieve 50% speech intelligibility at the sentence level.   ? Status On-going   ? Target Date 08/27/21   ? ?  ?  ? ?   ? ? ? Plan - 08/17/21 2121   ? ? Clinical Impression Statement Pt presents with worsened speech intelligibility d/t increased whole body pain. Continue skilled ST intervention.   ? Speech Therapy Frequency 2x / week   ? Duration 12 weeks   ? Treatment/Interventions Language facilitation;Compensatory techniques;Cueing hierarchy;Internal/external aids;SLP instruction and feedback;Patient/family education;Compensatory strategies   ? Potential to Achieve Goals Good   ? Consulted and Agree with Plan of Care Patient   ? ?  ?  ? ?  ? ? ?Patient will benefit from skilled therapeutic intervention in order to improve the following deficits and impairments:   ?Apraxia ? ?H/O ischemic left MCA stroke ? ?H/O ischemic left ACA stroke ? ? ? ?Problem List ?Patient Active Problem List  ? Diagnosis Date Noted  ? Alterations of sensations following cerebrovascular accident 03/08/2021  ? Aphasia as late effect of cerebrovascular accident (CVA) 03/08/2021  ? Muscle spasm 02/22/2021  ? Acute non-recurrent frontal sinusitis 02/22/2021  ? Chronic pain syndrome 01/23/2021  ? Primary hypertension 01/23/2021  ? Nerve pain 01/23/2021  ? Erectile dysfunction due to diseases classified elsewhere 01/23/2021  ? Cognitive dysfunction 01/23/2021  ? Scrotal edema 01/23/2021  ? Elevated LDL cholesterol level 01/23/2021  ? History of stroke with residual deficit 01/23/2021  ? Primary insomnia 01/23/2021  ? Mouth pain 01/23/2021  ? Laceration of right hand without foreign body   ? Closed compression fracture of L1 vertebra (HCC) 05/24/2016  ? Lumbar stenosis with neurogenic claudication 11/01/2015  ? Chronic 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 11/08/2014  ? Hx of hemorrhoids 11/08/2014  ? AAA (abdominal aortic aneurysm) (HCC) 09/22/2014  ? Chronic pain associated with significant psychosocial dysfunction 09/22/2014  ? Panic disorder 09/22/2014  ? AB (asthmatic bronchitis) 08/17/2014  ? Anxiety 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  08/17/2014  ? Backache 08/17/2014  ? Lumbosacral spondylosis without myelopathy 08/17/2014  ? Disorder of male genital organs 08/17/2014  ? Brash 08/17/2014  ? Low back pain 08/17/2014  ? Tendon nodule 08/17/2014  ? Episodic paroxysmal anxiety disorder 08/17/2014  ? Hernia, inguinal, right 08/17/2014  ? Fast heart beat 08/17/2014  ? Illness 08/17/2014  ? Inguinal hernia 10/14/2012  ? ?Averianna Brugger B. Dreama Saaverton, M.S., CCC-SLP,  CBIS ?Public relations account executive ?Rehabilitation Services ?Office [phone] ? ?Simone Rodenbeck, CCC-SLP ?08/17/2021, 9:22 PM ? ?[place] ?[facility] ?[address] ?[place], [place], [address] ?Phone: [phone]   Fax:  [phone] ? ? ?Name: Xzaviar Maloof ?MRN: [id number] ?Date of Birth: Sep 09, 1970 ? ?

## 2021-08-17 NOTE — Therapy (Signed)
?[place] [facility] ?[place], [place], [address] ?Phone: [phone]   Fax:  [phone] ? ?Physical Therapy Treatment ? ?Patient Details  ?Name: David Reeves ?MRN: [id number] ?Date of Birth: 26-May-1970 ?Referring Provider (PT): [name] PA ? ? ?Encounter Date: 08/17/2021 ? ? PT End of Session - 08/17/21 1134   ? ? Visit Number 16   ? Number of Visits 17   ? Date for PT Re-Evaluation 08/21/21   ? Authorization Type [employer] Medicare- authorized 16 visits 4/4-6/1   ? Authorization - Visit Number 4   ? Authorization - Number of Visits 16   ? Progress Note Due on Visit 20   ? PT Start Time 1015   ? PT Stop Time 1102   ? PT Time Calculation (min) 47 min   ? Equipment Utilized During Treatment Gait belt   ? Activity Tolerance Patient tolerated treatment well;No increased pain   ? Behavior During Therapy [facility] for tasks assessed/performed   ? ?  ?  ? ?  ? ? ?Past Medical History:  ?Diagnosis Date  ? Back pain 04/22/2012  ? Bone spur   ? Bulging disc 04/22/2012  ? Degenerative disc disease   ? Osteoarthritis   ? Panic anxiety syndrome   ? Stroke [facility]) 10/2020  ? Taking multiple medications for chronic disease   ? ? ?Past Surgical History:  ?Procedure Laterality Date  ? CYSTECTOMY    ? head  ? HERNIA REPAIR Left 2006,2014  ? [facility]  ? TOE SURGERY Right 2007  ? ? ?There were no vitals filed for this visit. ? ? Subjective Assessment - 08/17/21 1010   ? ? Subjective Pt reports 5/10 NPS in Low back but has been using moist heat prior to session. Currently a 3/10. No falls or other concerns.   ? Pertinent History Pt. is a 51 y.o. who was diagnosed with a CVA on July 21st, 2022. Pt. completed several weeks of  inpatient rehab at [facility]. After returning to home. He was discharged in late August/early September and recieved home health PT. Pt. sustained a fall in december of 2022, and was admitted to the hospital with COVID-19, and Chronic back pain. He reports  chronic back pain syndrome since 2012. Since the most recent discharge, Pt. has been residing with the ex-wife until the Pt. is ready to return to independent living. He did recieve 2 weeks of home health after discharge in December 2022. He is now being referred to outpatient PT to address weakness from stroke and improve fine motor movement. He reports rarely getting numbness/tingling in RLE. PMHx includes: Bilateral LBP with right sided sciatica, Lumbosacral spondylosis myelopathy, closed compression Fx of L1. Has chronic insomnia. osteoarthritis, Panic anxiety syndrome (taking medication) Pt. enjoys cooking, and riding motorcycles. Patient is going to [facility] center for Epidural spinal injections on 06/07/21;   ? Currently in Pain? No/denies   ? Pain Score 3    ? Pain Location Back   ? Pain Orientation Lower   ? Pain Descriptors / Indicators Aching;Tightness;Sore   ? Pain Type Chronic pain   ? Pain Onset More than a month ago   ? ?  ?  ? ?  ? ?Manual therapy: Patient lying in supine with moist heat to low back.  ?  ?Hamstring stretch (BLE) x 20 sec x 3 each leg.  ?Knee to chest (BLE) x 20 sec x 3 each leg ?Manual hip circles (CW/CCW) x 10  reps each direction and each LE ? ?  ?   ?Neuromuscular Re-ed: ?  ?(Pt seated )Simulating coordinating driving with RLE with hitting gas pedal and brake pedal. Porcupine balls (x2) one for brake and one for gas pedal set up on wedge in front of pt similar length as diriving position:  ? Alternating from ball to ball for coordination and consistent hitting of target with foot: x15 ? Alternating from ball to ball with increased speed to improve coordination and reaction time: 2 minutes  ? VC's from PT talking pt through varying driving tasks for cognitive task/motor control of identifying need to either accelerate on gas pedal or brake. Focus on situational awareness/situations in driving requiring varying speed changes for amplitude of motion in R ankle of hitting each pedal  (i.e. need for emergent braking, driving through parking lot, getting onto highway, etc.): 5 minutes ? ? ?RLE SLS: 2x15 sec. CGA  ? ?LLE SLS: 5 trials, holding b/t 8-10 secs with L lateral lean to // bar for support intermittently. CGA ? ? ?Tandem stand- Hold 30 sec x 3 each Side. Appears to be steady with RLE under BOS compared to previous session per EMR. Able to maintain with SBA and minimal lateral sway. ? ? ?Standing RLE/LLE foot taps on porcupine balls to mimic coordination of pressing gas pedals. X10/LE. Increased time spent pressing on porcupine balls on L foot due to decreased balance and stance on RLE. CGA.  ? ?Tandem stance BLE's on airex with CGA  ? 1x30 sec, good stability  ? X10 horizontal head turns R/L, mild frontal plane LOB requiring UE against // bar periodically to correct balance.  ? ? ? ? ? PT Education - 08/17/21 1012   ? ? Education Details form/technique with exercise.   ? Person(s) Educated Patient   ? Methods Explanation;Demonstration;Tactile cues;Verbal cues   ? Comprehension Verbalized understanding;Returned demonstration;Verbal cues required;Tactile cues required;Need further instruction   ? ?  ?  ? ?  ? ? ? PT Short Term Goals - 07/24/21 1152   ? ?  ? PT SHORT TERM GOAL #1  ? Title Patient will be adherent to HEP at least 3x a week to improve functional strength and balance for better safety at home.   ? Baseline 4/4: doing them 1-2x a week   ? Time 4   ? Period Weeks   ? Status Partially Met   ? Target Date 08/21/21   ?  ? PT SHORT TERM GOAL #2  ? Title Patient (< 68 years old) will complete five times sit to stand test in < 15 seconds indicating an increased LE strength and improved balance.   ? Baseline 4/4: 17.85 sec with arms across chest   ? Time 4   ? Period Weeks   ? Status Partially Met   ? Target Date 08/21/21   ? ?  ?  ? ?  ? ? ? ? PT Long Term Goals - 07/24/21 1215   ? ?  ? PT LONG TERM GOAL #1  ? Title Patient will increase six minute walk test distance to >1000 for  progression to community ambulator and improve gait ability   ? Baseline 4/4: 1115 feet   ? Time 4   ? Period Weeks   ? Status Achieved   ? Target Date 08/21/21   ?  ? PT LONG TERM GOAL #2  ? Title Patient will ascend/descend 8 stairs without rail assist independently without loss of balance to improve  ability to get in/out of home.   ? Baseline 4/4: requires 1 rail assist   ? Time 4   ? Period Weeks   ? Status Partially Met   ? Target Date 08/21/21   ?  ? PT LONG TERM GOAL #3  ? Title Patient will increase BLE gross strength to 4+/5 as to improve functional strength for independent gait, increased standing tolerance and increased ADL ability.   ? Baseline 4/4: not formally assessed;   ? Time 4   ? Period Weeks   ? Status Partially Met   ? Target Date 08/21/21   ?  ? PT LONG TERM GOAL #4  ? Title Patient will improve FOTO score to >50% to indicate improved functional mobility with less pain with ADLs.   ? Baseline 4/4: 35%   ? Time 4   ? Period Weeks   ? Status Not Met   ? Target Date 08/21/21   ?  ? PT LONG TERM GOAL #5  ? Title Patient will report a worst pain of 4/10 in low back over last week to indicate improved tolerance with ADLs.   ? Baseline 4/4: 7/10   ? Time 4   ? Period Weeks   ? Status Not Met   ? Target Date 08/21/21   ? ?  ?  ? ?  ? ? ? ? ? ? ? ? Plan - 08/17/21 1135   ? ? Clinical Impression Statement Pt remaining with increased LBP prior to session. Initial focus on lumbar mobility to reduce pain prior to focus on strength, balance, gait tasks. Pt demonstrating improved balance with tandem stance with RLE under BOS. Focus on RLE coordination with mimicing of driving tasks as pt reporting on in process of safe return to driving. Overall pt has increased fatigue in RLE per subjective reports today but appears to be improving in balance with reduced BOS compared to previous session as pt does not demonstrate any significnat unsteadiness, need for UE support, or sway in sagittal or frontal planes. Pt  approaching end of current POC and will need reassessment of goals in future session to assess progress in PT.   ? Personal Factors and Comorbidities Comorbidity 3+;Past/Current Experience;Time since onset of injur

## 2021-08-20 ENCOUNTER — Ambulatory Visit: Payer: Medicare HMO | Attending: Physician Assistant | Admitting: Occupational Therapy

## 2021-08-20 ENCOUNTER — Encounter: Payer: Self-pay | Admitting: Occupational Therapy

## 2021-08-20 ENCOUNTER — Ambulatory Visit: Payer: Medicare HMO | Admitting: Speech Pathology

## 2021-08-20 ENCOUNTER — Ambulatory Visit: Payer: Medicare HMO

## 2021-08-20 ENCOUNTER — Encounter: Payer: Self-pay | Admitting: Physician Assistant

## 2021-08-20 DIAGNOSIS — R262 Difficulty in walking, not elsewhere classified: Secondary | ICD-10-CM

## 2021-08-20 DIAGNOSIS — R41841 Cognitive communication deficit: Secondary | ICD-10-CM | POA: Insufficient documentation

## 2021-08-20 DIAGNOSIS — M545 Low back pain, unspecified: Secondary | ICD-10-CM | POA: Insufficient documentation

## 2021-08-20 DIAGNOSIS — F09 Unspecified mental disorder due to known physiological condition: Secondary | ICD-10-CM | POA: Diagnosis not present

## 2021-08-20 DIAGNOSIS — I69890 Apraxia following other cerebrovascular disease: Secondary | ICD-10-CM | POA: Insufficient documentation

## 2021-08-20 DIAGNOSIS — R471 Dysarthria and anarthria: Secondary | ICD-10-CM

## 2021-08-20 DIAGNOSIS — R2681 Unsteadiness on feet: Secondary | ICD-10-CM

## 2021-08-20 DIAGNOSIS — R482 Apraxia: Secondary | ICD-10-CM | POA: Insufficient documentation

## 2021-08-20 DIAGNOSIS — Z8673 Personal history of transient ischemic attack (TIA), and cerebral infarction without residual deficits: Secondary | ICD-10-CM | POA: Diagnosis present

## 2021-08-20 DIAGNOSIS — G8929 Other chronic pain: Secondary | ICD-10-CM | POA: Diagnosis present

## 2021-08-20 DIAGNOSIS — M6281 Muscle weakness (generalized): Secondary | ICD-10-CM

## 2021-08-20 DIAGNOSIS — R269 Unspecified abnormalities of gait and mobility: Secondary | ICD-10-CM

## 2021-08-20 DIAGNOSIS — R278 Other lack of coordination: Secondary | ICD-10-CM

## 2021-08-20 NOTE — Therapy (Signed)
[place] ?[facility] ?[place], [place], [address] ?Phone: [phone]   Fax:  [phone] ? ?Physical Therapy Treatment/Recertification for dates 08/20/2021- 11/12/2021 ? ?Patient Details  ?Name: David Reeves ?MRN: [id number] ?Date of Birth: 04/01/1971 ?Referring Provider (PT): [name] PA ? ? ?Encounter Date: 08/20/2021 ? ? PT End of Session - 08/20/21 0953   ? ? Visit Number 17   ? Number of Visits 41   ? Date for PT Re-Evaluation 11/12/21   ? Authorization Type [employer]- authorized 16 visits 4/4-6/1   ? Authorization - Visit Number 5   ? Authorization - Number of Visits 16   ? Progress Note Due on Visit 20   ? PT Start Time 1002   ? PT Stop Time 1044   ? PT Time Calculation (min) 42 min   ? Equipment Utilized During Treatment --   ? Activity Tolerance Patient tolerated treatment well;No increased pain   ? Behavior During Therapy [facility] for tasks assessed/performed   ? ?  ?  ? ?  ? ? ?Past Medical History:  ?Diagnosis Date  ? Back pain 04/22/2012  ? Bone spur   ? Bulging disc 04/22/2012  ? Degenerative disc disease   ? Osteoarthritis   ? Panic anxiety syndrome   ? Stroke [facility]) 10/2020  ? Taking multiple medications for chronic disease   ? ? ?Past Surgical History:  ?Procedure Laterality Date  ? CYSTECTOMY    ? head  ? HERNIA REPAIR Left 2006,2014  ? [facility]  ? TOE SURGERY Right 2007  ? ? ?There were no vitals filed for this visit. ? ? Subjective Assessment - 08/20/21 0948   ? ? Subjective Patient reports he feels he is making progress- Walking better and no falls. He reports his tightness from CVA and his chronic LBP still have him limited in his abilities at home and in community.   ? Pertinent History Pt. is a 52 y.o. who was diagnosed with a CVA on July 21st, 2022. Pt. completed several weeks of  inpatient rehab at [facility]. After returning to home. He was discharged in late August/early September and recieved home health PT. Pt. sustained a  fall in december of 2022, and was admitted to the hospital with COVID-19, and Chronic back pain. He reports chronic back pain syndrome since 2012. Since the most recent discharge, Pt. has been residing with the ex-wife until the Pt. is ready to return to independent living. He did recieve 2 weeks of home health after discharge in December 2022. He is now being referred to outpatient PT to address weakness from stroke and improve fine motor movement. He reports rarely getting numbness/tingling in RLE. PMHx includes: Bilateral LBP with right sided sciatica, Lumbosacral spondylosis myelopathy, closed compression Fx of L1. Has chronic insomnia. osteoarthritis, Panic anxiety syndrome (taking medication) Pt. enjoys cooking, and riding motorcycles. Patient is going to [facility] spine center for Epidural spinal injections on 06/07/21;   ? Currently in Pain? Yes   ? Pain Score 4    ? Pain Location Back   ? Pain Orientation Posterior;Lower   ? Pain Descriptors / Indicators Aching;Tightness;Sore   ? Pain Type Chronic pain   ? Pain Onset More than a month ago   ? Pain Frequency Constant   ? Aggravating Factors  Bending, lifting, prolonged sitting   ? Pain Relieving Factors heat/meds/e-stim   ? Effect of Pain on Daily Activities Difficulty with ADL's and walking/standing   ? ?  ?  ? ?  ? ? ?  INTERVENTIONS:  ?Manual therapy: Patient lying in supine with moist heat to low back.  ?  ?Hamstring stretch (BLE) x 20 sec x 3 each leg.  ?Knee to chest (BLE) x 20 sec x 3 each leg ?Manual hip circles (CW/CCW) x 10 reps each direction and each LE ?Lower trunk rotation x 20 sec x 3 each side ?Piriformis x 20 sec x 3 each LE ? ? ?Reassessed GOALS:  ? ? ? ?HEP: Patient reports performing his low back stretching and no questions currently HEP.  ? ?5xSTS= 17.60 sec without AD (complaining of 4/10 LBP)  ? ?FOTO: 42 ?Steps:  steps without Railing without difficulty ?LE strength with MMT: 4+/5 with Right hip flex/knee ext/flex; Right hip abd= 4/5 ?Low back  pain report= 4/10 ?10MWT= 10.6 sec without AD=0.96 m/s ?Single leg stance time= 15+ sec on left and 4 sec on Right LE ?Tandem stand- 30 sec on Left LE and 24 sec on Right LE ?.   ? ? ? ? ? ? ? ? ? ? ? ? ? ? ? ? ? ? PT Education - 08/20/21 1044   ? ? Education Details Purpose of PT recert and plan of care; Review of HEP   ? Person(s) Educated Patient   ? Methods Explanation;Demonstration   ? Comprehension Verbalized understanding   ? ?  ?  ? ?  ? ? ? PT Short Term Goals - 08/20/21 1001   ? ?  ? PT SHORT TERM GOAL #1  ? Title Patient will be adherent to HEP at least 3x a week to improve functional strength and balance for better safety at home.   ? Baseline 4/4: doing them 1-2x a week; 08/20/2021=Patient reports performing his low back stretching and no questions currently HEP.   ? Time 4   ? Period Weeks   ? Status Achieved   ? Target Date 08/21/21   ?  ? PT SHORT TERM GOAL #2  ? Title Patient (< 38 years old) will complete five times sit to stand test in < 15 seconds indicating an increased LE strength and improved balance.   ? Baseline 4/4: 17.85 sec with arms across chest; 08/20/2021= 17.6 sec   ? Time 4   ? Period Weeks   ? Status On-going   ? Target Date 10/01/21   ? ?  ?  ? ?  ? ? ? ? PT Long Term Goals - 08/20/21 1001   ? ?  ? PT LONG TERM GOAL #1  ? Title Patient will increase six minute walk test distance to >1000 for progression to community ambulator and improve gait ability   ? Baseline 4/4: 1115 feet   ? Time 4   ? Period Weeks   ? Status Achieved   ? Target Date 08/21/21   ?  ? PT LONG TERM GOAL #2  ? Title Patient will ascend/descend 8 stairs without rail assist independently without loss of balance to improve ability to get in/out of home.   ? Baseline 4/4: requires 1 rail assist; 08/20/2021- Patient demonstrated steps without railing using reciprocal steps today.   ? Time 4   ? Period Weeks   ? Status Achieved   ? Target Date 08/21/21   ?  ? PT LONG TERM GOAL #3  ? Title Patient will increase BLE gross  strength to 4+/5 as to improve functional strength for independent gait, increased standing tolerance and increased ADL ability.   ? Baseline 4/4: not formally assessed; 08/20/2021= 4+/5  with Right hip flex/knee ext/flex; Right hip abd= 4/5   ? Time 12   ? Period Weeks   ? Status Partially Met   ? Target Date 11/12/21   ?  ? PT LONG TERM GOAL #4  ? Title Patient will improve FOTO score to >50% to indicate improved functional mobility with less pain with ADLs.   ? Baseline 4/4: 35%; 08/20/2021= 42%   ? Time 12   ? Period Weeks   ? Status On-going   ? Target Date 11/12/21   ?  ? PT LONG TERM GOAL #5  ? Title Patient will report a worst pain of 4/10 in low back over last week to indicate improved tolerance with ADLs.   ? Baseline 4/4: 7/10; 08/20/2021= 4/10   ? Time 12   ? Period Weeks   ? Status Partially Met   ? Target Date 11/12/21   ?  ? Additional Long Term Goals  ? Additional Long Term Goals Yes   ?  ? PT LONG TERM GOAL #6  ? Title Pt will increase 10MWT by at least 0.13 m/s in order to demonstrate clinically significant improvement in community ambulation   ? Baseline 08/20/2021=0.94 m/s   ? Time 12   ? Period Weeks   ? Status New   ? Target Date 11/12/21   ?  ? PT LONG TERM GOAL #7  ? Title Patient will demonstrate improved dynamic standing balance as seen by ability to single leg stance on right LE > 12 sec consistently for optimal balance on level and unlevel surfaces.   ? Baseline 08/20/2021= 4 sec SLS on right   ? Time 12   ? Period Weeks   ? Status New   ? Target Date 11/12/21   ? ?  ?  ? ?  ? ? ? ? ? ? ? ? Plan - 08/20/21 0955   ? ? Clinical Impression Statement Patient presents with overall progression toward goals during this certification. He presents with improving self perceived ability as seen by FOTO score and lower pain reports. He continues to improve with functional strength as well. He remains limited functional mobility and new goals added for mobility and balance. Patient's condition has the potential  to improve in response to therapy. Maximum improvement is yet to be obtained. The anticipated improvement is attainable and reasonable in a generally predictable time.   ? Personal Factors and Comorbiditie

## 2021-08-20 NOTE — Patient Instructions (Signed)
Continue practice with home conversations and carryover of strategies  ?

## 2021-08-20 NOTE — Therapy (Signed)
[place] ?[facility] ?[address] ?[place], [place], [address] ?Phone: [phone]   Fax:  [phone] ? ?Speech Language Pathology Treatment ? ?Patient Details  ?Name: David Reeves ?MRN: [id number] ?Date of Birth: [birth year] ?Referring Provider (SLP): Alfredia Ferguson ? ? ?Encounter Date: 08/20/2021 ? ? End of Session - 08/20/21 1350   ? ? Visit Number 15   ? Number of Visits 25   ? Date for SLP Re-Evaluation 08/27/21   ? Authorization Type [employer] Medicare HMO   ? Authorization Time Period 06/04/2021 thru 08/27/2021   ? Authorization - Visit Number 5   ? Progress Note Due on Visit 10   ? SLP Start Time 0800   ? SLP Stop Time  0900   ? SLP Time Calculation (min) 60 min   ? Activity Tolerance Patient tolerated treatment well;No increased pain   ? ?  ?  ? ?  ? ? ?Past Medical History:  ?Diagnosis Date  ? Back pain 04/22/2012  ? Bone spur   ? Bulging disc 04/22/2012  ? Degenerative disc disease   ? Osteoarthritis   ? Panic anxiety syndrome   ? Stroke [facility]) 10/2020  ? Taking multiple medications for chronic disease   ? ? ?Past Surgical History:  ?Procedure Laterality Date  ? CYSTECTOMY    ? head  ? HERNIA REPAIR Left 2006,2014  ? [facility]  ? TOE SURGERY Right 2007  ? ? ?There were no vitals filed for this visit. ? ? Subjective Assessment - 08/20/21 1341   ? ? Subjective Patient arrived eager and motivated to improve   ? Currently in Pain? No/denies   ? ?  ?  ? ?  ? ? ? ? ? ? ? ? ADULT SLP TREATMENT - 08/20/21 0001   ? ?  ? Treatment Provided  ? Treatment provided Cognitive-Linquistic   ?  ? Cognitive-Linquistic Treatment  ? Treatment focused on Apraxia;Patient/family/caregiver education   ? Skilled Treatment Skilled treatment targeted improved speech intelligibility. SLP facilitated session by providing the following interventions: Cued implementation of speech intelligibility strategies in conversational speech and functional reading.  Patient demonstrated independent use  of speech strategies in conversation with 55% accuracy x20 verbal responses, improved with minimal cues for strategy target. Patient dmeonstrated verbal reading of menu with 60% accuracy x5 menu items+descriptions, improved to 100% accuracy with visual models by SLP. Patient demonstrated reading at the paragraph level with x3 visual cues per paragraph in x2 paragraphs, patient improved with indpendent repair of x2 errors on third paragraph. Patient demonstrated fatigue affect with continued paragraph trials with x5 errors on final 4th paragraph indicative of need for intervention targeting endurance and training increased strategy specificity when fatigued.   ? ?  ?  ? ?  ? ? ? SLP Education - 08/20/21 1349   ? ? Education Details strategies and times of use   ? Person(s) Educated Patient   ? Methods Explanation;Demonstration   ? Comprehension Verbalized understanding   ? ?  ?  ? ?  ? ? ? SLP Short Term Goals - 07/28/21 1830   ? ?  ? SLP SHORT TERM GOAL #1  ? Title Pt will use the speech intelligibility strategy of pacing to slow rate of speech at the simple sentence level in 8 out of 10 opportunities.   ? Time 10   ? Period --   sessions  ? Status On-going   ?  ? SLP SHORT TERM GOAL #2  ?  Title Pt will produce fricatives in the initial, final and medial positions of words with 90% accuracy.   ? Time 10   ? Period --   sessions  ? Status On-going   ?  ? SLP SHORT TERM GOAL #3  ? Title Pt will use speech intelligibility strategy of overarticulation to improve speech intelligibility to 75% at the simple sentence level.   ? Time 10   ? Period --   sessions  ?  ? SLP SHORT TERM GOAL #4  ? Title Pt will answer WH questions after reading paragraph level passage with 75% accuracy.   ? Time 10   ? Period --   sessions  ? Status On-going   ? ?  ?  ? ?  ? ? ? SLP Long Term Goals - 07/28/21 1831   ? ?  ? SLP LONG TERM GOAL #1  ? Title Pt will use speech intelligibility stategies to achieve 50% speech intelligibility at the  sentence level.   ? Status On-going   ? Target Date 08/27/21   ? ?  ?  ? ?  ? ? ? Plan - 08/20/21 1350   ? ? Clinical Impression Statement Pt presents with mild-moderate apraxia and dysarthria impacting functional communication. Intervention targeted speech intelligibility in conversation and functional reading as above. Patient benefits from verbal cues for strategy use and visual models for production. Patient demonstrated increased need for endurance training as patient demonstrated increased errors and reduced intelligibility at end of session. Patient continues to benefit from skilled ST intervention to improve speech and communication for independence in home/community environment and improve quality of life   ? Speech Therapy Frequency 2x / week   ? Duration 12 weeks   ? Treatment/Interventions Language facilitation;Compensatory techniques;Cueing hierarchy;Internal/external aids;SLP instruction and feedback;Patient/family education;Compensatory strategies   ? Potential to Achieve Goals Good   ? SLP Home Exercise Plan provided, see pt instructions section   ? Consulted and Agree with Plan of Care Patient   ? ?  ?  ? ?  ? ? ?Patient will benefit from skilled therapeutic intervention in order to improve the following deficits and impairments:   ?Dysarthria and anarthria ? ?Apraxia following other cerebrovascular disease ? ? ? ?Problem List ?Patient Active Problem List  ? Diagnosis Date Noted  ? Alterations of sensations following cerebrovascular accident 03/08/2021  ? Aphasia as late effect of cerebrovascular accident (CVA) 03/08/2021  ? Muscle spasm 02/22/2021  ? Acute non-recurrent frontal sinusitis 02/22/2021  ? Chronic pain syndrome 01/23/2021  ? Primary hypertension 01/23/2021  ? Nerve pain 01/23/2021  ? Erectile dysfunction due to diseases classified elsewhere 01/23/2021  ? Cognitive dysfunction 01/23/2021  ? Scrotal edema 01/23/2021  ? Elevated LDL cholesterol level 01/23/2021  ? History of stroke with  residual deficit 01/23/2021  ? Primary insomnia 01/23/2021  ? Mouth pain 01/23/2021  ? Laceration of right hand without foreign body   ? Closed compression fracture of L1 vertebra (HCC) 05/24/2016  ? Lumbar stenosis with neurogenic claudication 11/01/2015  ? Chronic 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 11/08/2014  ? Hx of hemorrhoids 11/08/2014  ? AAA (abdominal aortic aneurysm) (HCC) 09/22/2014  ? Chronic pain associated with significant psychosocial dysfunction 09/22/2014  ? Panic disorder 09/22/2014  ? AB (asthmatic bronchitis) 08/17/2014  ? Anxiety 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 08/17/2014  ? Backache 08/17/2014  ? Lumbosacral spondylosis without myelopathy 08/17/2014  ? Disorder of male genital organs 08/17/2014  ? Brash 08/17/2014  ? Low  back pain 08/17/2014  ? Tendon nodule 08/17/2014  ? Episodic paroxysmal anxiety disorder 08/17/2014  ? Hernia, inguinal, right 08/17/2014  ? Fast heart beat 08/17/2014  ? Illness 08/17/2014  ? Inguinal hernia 10/14/2012  ? ?[place] L. Sharunda Salmon, M.A. CCC-SLP ?Adult-based Speech Language Pathologist ?[facility]Outpatient [facility] ?([phone] ? ? ?Cynda Acres, CCC-SLP ?08/20/2021, 1:54 PM ? ?[place] ?[facility] ?[address] ?[place], [place], [address] ?Phone: [phone]   Fax:  [phone] ? ? ?Name: David Reeves ?MRN: [id number] ?Date of Birth: [birth year] ? ?

## 2021-08-20 NOTE — Therapy (Addendum)
[place] ?[facility] ?[place], [place], [address] ?Phone: [phone]   Fax:  [phone] ? ?Occupational Therapy Treatment ? ?Patient Details  ?Name: David Reeves ?MRN: [id number] ?Date of Birth: [birth year] ?Referring Provider (OT): [name] ? ? ?Encounter Date: 08/20/2021 ? ? OT End of Session - 08/20/21 1003   ? ? Visit Number 23   ? Number of Visits 44   ? Date for OT Re-Evaluation 11/01/21   ? Authorization Type Progress reporting period starting 08/10/21   ? OT Start Time 0915   ? OT Stop Time 0955   ? OT Time Calculation (min) 40 min   ? Activity Tolerance Patient tolerated treatment well   ? Behavior During Therapy [facility] for tasks assessed/performed   ? ?  ?  ? ?  ? ? ?Past Medical History:  ?Diagnosis Date  ? Back pain 04/22/2012  ? Bone spur   ? Bulging disc 04/22/2012  ? Degenerative disc disease   ? Osteoarthritis   ? Panic anxiety syndrome   ? Stroke [facility]) 10/2020  ? Taking multiple medications for chronic disease   ? ? ?Past Surgical History:  ?Procedure Laterality Date  ? CYSTECTOMY    ? head  ? HERNIA REPAIR Left 2006,2014  ? [facility]  ? TOE SURGERY Right 2007  ? ? ?There were no vitals filed for this visit. ? ? Subjective Assessment - 08/20/21 1001   ? ? Subjective  Pt. reports that his appointment with the neurosurgeon is not until the very end of June.   ? Patient is accompanied by: Family member   ? Pertinent History Pt. is a 51 y.o. who was diagnosed with a CVA on July 21st, 2022. Pt. completed several weeks of  inpatient rehab at [facility]. After returning to home, Pt. sustained a fall in december of 2022, and was admitted to the hospital with COVI-19, and back pain from the fall. Since the most recent discharge, Pt. has been residing with the ex-wife until the Pt. is ready to return to independent living. Pt. PMHx includes: Bilateral LBP with right sided sciatica, Lumbosacral spondylosis myelopathy, closed compression Fx of L1. Pt.  enjoys cooking, and riding motorcycles.   ? Currently in Pain? Yes   ? Pain Score 4    ? Pain Location Back   ? Pain Orientation Lower   ? Pain Descriptors / Indicators Sore;Aching   ? Pain Type Chronic pain   ? Pain Onset More than a month ago   ? ?  ?  ? ?  ? ?OT TREATMENT   ?  ?Neuro muscular re-education: ?  ?Pt. worked on right hand [facility] skills grasping 1" cubes, and working on controlled release of the cubes. Kinesio tape was applied to the right thumb for thumb IP extension to improve accuracy with grasp. Pt. worked on isolating his right 2nd digit while pressing the cubes in place. Pt. requires tactile cues, and assist to formulate isolated 2nd digit extension. ?  ?Therapeutic Exercise: ?  ?Pt. worked on [employer], and reciprocal motion using the UBE while seated for 8 min. with minimal resistance. Constant monitoring was provided, and an ACE wrap was utilized to secure his right hand onto the UBE handle. ?  ?Pt. reports 4/10 low back pain this morning. Pt. reports that he has an appointment with the neurosurgeon, however not until the very end of June. Pt. continues to progress with the RUE grip. Pt. was able to perform  grasp patterns without the thumb IP joint flexing when the Kinesio tape is in place for thumb IP extension. Pt. required tactile cues, and assist to formulate isolated 2nd digit extension in preparation from pressing the cubes in place. Pt. continues to work on improving RUE strength, and [facility] skills in order to work towards improving, and maximizing independence with ADLs, and IADL tasks. ?  ?  ? ?  ? ? ? ? ? ? ? ? ? ? ? ? ? ? ? ? ? ? ? ? ? OT Education - 08/20/21 1002   ? ? Education Details Isolating the 2nd digit, 2pt. point grasp patterns.   ? Person(s) Educated Patient   ? Methods Explanation;Demonstration;Tactile cues;Verbal cues   ? Comprehension Verbalized understanding;Returned demonstration;Verbal cues required;Tactile cues required;Need further instruction   ? ?  ?  ? ?   ? ? ? OT Short Term Goals - 08/10/21 1330   ? ?  ? OT SHORT TERM GOAL #1  ? Title Pt. wil demonstrtae independence with HEPs   ? Baseline Eval: Pt. currently does not have one, 10th visit:  changes in HEP ongoing; 20th: instructed in putty exercises, changes ongoing   ? Time 6   ? Period Weeks   ? Status On-going   ? Target Date 09/20/21   ? ?  ?  ? ?  ? ? ? ? OT Long Term Goals - 08/10/21 1331   ? ?  ? OT LONG TERM GOAL #1  ? Title Pt. will improve RUE strength by 2 mm grades to assist with ADLs, and IADLs,   ? Baseline Eval: Right shoulder flexion, abduction 3/5, elbow flexion, extension wrist extension 3+/5, 10th visit: improving with RUE strength by not yet met goal; 20th visit: R shd flex/abd 4-, elbow flex/ext 4+, wrist flex 4-, wrist ext 4+   ? Time 12   ? Period Weeks   ? Status On-going   ? Target Date 11/01/21   ?  ? OT LONG TERM GOAL #2  ? Title Pt. will improve right grip by 10 lbs to prepare for firmly holding objects for IADLs.   ? Baseline Eval: R: 38#, L: 124# (In 3rd dynamometer slot); R 35# in 4th slot, 28# in 3rd slot   ? Time 12   ? Period Weeks   ? Status On-going   ? Target Date 11/01/21   ?  ? OT LONG TERM GOAL #3  ? Title Pt. will improve right lateral pinch strength by 5# to assist with cutting food   ? Baseline Eval: R: 17, L: 25; 20th: R: 18   ? Time 12   ? Period Weeks   ? Status On-going   ? Target Date 11/01/21   ?  ? OT LONG TERM GOAL #4  ? Title Pt. will improve Right 3pt pinch by 2# to be able to hold/open items for cooking   ? Baseline Eval: R: Pt. unable to engage thumb L: 29#; 20th: R unable   ? Time 12   ? Period Weeks   ? Status On-going   ? Target Date 11/01/21   ?  ? OT LONG TERM GOAL #5  ? Title Pt. will improve right hand [facility] skills to be able to independently manipulate buttons, and zippers.   ? Baseline Eval: Pt. has difficulty managing buttons,a nd zippers. 10th visit:  improving; 20th: unable to engage R hand effectively, performs 1 handed with L   ? Time 12   ?  Period  Weeks   ? Status On-going   ? Target Date 11/01/21   ?  ? OT LONG TERM GOAL #6  ? Title Pt. will independently write his name   ? Baseline Eval: Pt. is unable to hold a pen; 20th: pt can grip [employer] with R hand and can make marks on paper with difficulty, but not yet writing name   ? Time 12   ? Period Weeks   ? Status On-going   ? Target Date 11/01/21   ?  ? OT LONG TERM GOAL #7  ? Title Pt. will improve FOTO score by 2 points to reflect funational improvement   ? Baseline Eval: FOTO score 46 with TR score 52; 20th: 45   ? Time 12   ? Period Weeks   ? Status On-going   ? Target Date 11/01/21   ? ?  ?  ? ?  ? ? ? ? ? ? ? ? Plan - 08/20/21 1004   ? ? Clinical Impression Statement Pt. reports 4/10 low back pain this morning. Pt. reports that he has an appointment with the neurosurgeon, however not until the very end of June. Pt. continues to progress with the RUE grip. Pt. was able to perform grasp patterns without the thumb IP joint flexing when the Kinesio tape is in place for thumb IP extension. Pt. required tactile cues, and assist to formulate isolated 2nd digit extension in preparation from pressing the cubes in place. Pt. continues to work on improving RUE strength, and [facility] skills in order to work towards improving, and maximizing independence with ADLs, and IADL tasks. ?  ?  ? ?  ?  ?   ? OT Occupational Profile and History Detailed Assessment- Review of Records and additional review of physical, cognitive, psychosocial history related to current functional performance   ? Occupational performance deficits (Please refer to evaluation for details): ADL's;IADL's;Education   ? Rehab Potential Good   ? Clinical Decision Making Several treatment options, min-mod task modification necessary   ? Comorbidities Affecting Occupational Performance: May have comorbidities impacting occupational performance   ? Modification or Assistance to Complete Evaluation  Min-Moderate modification of tasks or assist with assess  necessary to complete eval   ? OT Frequency 2x / week   ? OT Duration 12 weeks   ? OT Treatment/Interventions Self-care/ADL training;DME and/or AE instruction;Therapeutic exercise;Ultrasound;Neuromuscular ed

## 2021-08-22 ENCOUNTER — Ambulatory Visit: Payer: Medicare HMO

## 2021-08-22 ENCOUNTER — Ambulatory Visit: Payer: Medicare HMO | Admitting: Speech Pathology

## 2021-08-22 DIAGNOSIS — M6281 Muscle weakness (generalized): Secondary | ICD-10-CM

## 2021-08-22 DIAGNOSIS — R482 Apraxia: Secondary | ICD-10-CM

## 2021-08-22 DIAGNOSIS — R269 Unspecified abnormalities of gait and mobility: Secondary | ICD-10-CM | POA: Diagnosis not present

## 2021-08-22 DIAGNOSIS — F09 Unspecified mental disorder due to known physiological condition: Secondary | ICD-10-CM | POA: Diagnosis not present

## 2021-08-22 DIAGNOSIS — R278 Other lack of coordination: Secondary | ICD-10-CM

## 2021-08-22 DIAGNOSIS — R41841 Cognitive communication deficit: Secondary | ICD-10-CM | POA: Diagnosis not present

## 2021-08-22 DIAGNOSIS — R262 Difficulty in walking, not elsewhere classified: Secondary | ICD-10-CM | POA: Diagnosis not present

## 2021-08-22 DIAGNOSIS — M545 Low back pain, unspecified: Secondary | ICD-10-CM | POA: Diagnosis not present

## 2021-08-22 DIAGNOSIS — R2681 Unsteadiness on feet: Secondary | ICD-10-CM | POA: Diagnosis not present

## 2021-08-22 NOTE — Therapy (Signed)
[place] ?[facility] ?[place], [place], [address] ?Phone: [phone]   Fax:  [phone] ? ?Patient Details  ?Name: David Reeves ?MRN: [id number] ?Date of Birth: [birth year] ?Referring Provider:  [name], PA-C ? ?Encounter Date: 08/22/2021 ? ?Patient arrived for OT appointment. Decided he did not want to wait for PT appointment despite it being 15 minutes. Left therapy.  ? ?Janna Arch, PT, DPT ? ?08/22/2021, 10:24 AM ? ?[place] ?[facility] ?[place], [place], [address] ?Phone: [phone]   Fax:  [phone] ?

## 2021-08-22 NOTE — Therapy (Signed)
[place] ?[facility] ?[place], [place], [address] ?Phone: [phone]   Fax:  [phone] ? ?Occupational Therapy Treatment ? ?Patient Details  ?Name: David Reeves ?MRN: [id number] ?Date of Birth: 1970-09-21 ?Referring Provider (OT): [name] ? ? ?Encounter Date: 08/22/2021 ? ? OT End of Session - 08/22/21 1621   ? ? Visit Number 24   ? Number of Visits 44   ? Date for OT Re-Evaluation 11/01/21   ? Authorization Type Progress reporting period starting 08/10/21   ? OT Start Time 0915   ? OT Stop Time 1000   ? OT Time Calculation (min) 45 min   ? Activity Tolerance Patient tolerated treatment well   ? Behavior During Therapy [facility] for tasks assessed/performed   ? ?  ?  ? ?  ? ? ?Past Medical History:  ?Diagnosis Date  ? Back pain 04/22/2012  ? Bone spur   ? Bulging disc 04/22/2012  ? Degenerative disc disease   ? Osteoarthritis   ? Panic anxiety syndrome   ? Stroke [facility]) 10/2020  ? Taking multiple medications for chronic disease   ? ? ?Past Surgical History:  ?Procedure Laterality Date  ? CYSTECTOMY    ? head  ? HERNIA REPAIR Left 2006,2014  ? [facility]  ? TOE SURGERY Right 2007  ? ? ?There were no vitals filed for this visit. ? ? Subjective Assessment - 08/22/21 1618   ? ? Subjective  "I'm seeing the improvement.  It's just slow."   ? Patient is accompanied by: Family member   ? Pertinent History Pt. is a 51 y.o. who was diagnosed with a CVA on July 21st, 2022. Pt. completed several weeks of  inpatient rehab at [facility]. After returning to home, Pt. sustained a fall in december of 2022, and was admitted to the hospital with COVI-19, and back pain from the fall. Since the most recent discharge, Pt. has been residing with the ex-wife until the Pt. is ready to return to independent living. Pt. PMHx includes: Bilateral LBP with right sided sciatica, Lumbosacral spondylosis myelopathy, closed compression Fx of L1. Pt. enjoys cooking, and riding motorcycles.   ?  Patient Stated Goals Pt would like to be as independent as he was before, improve hand function.   ? Currently in Pain? Yes   ? Pain Score 3    ? Pain Location Back   ? Pain Orientation Lower   ? Pain Descriptors / Indicators Sore;Aching   ? Pain Type Chronic pain   ? Pain Radiating Towards low back   ? Pain Onset More than a month ago   ? Pain Frequency Constant   ? Aggravating Factors  bending, lifting, prolonged sitting   ? Pain Relieving Factors heat/meds/e-stim   ? Effect of Pain on Daily Activities difficulty with ADLs and walking/standing/prolonged sitting   ? Multiple Pain Sites No   ? ?  ?  ? ?  ? ?Occupational Therapy Treatment: ?Neuro re-ed: ?Completed digit isolation exercises, including finger taps for each digit, x3 sets 10 reps each; OT providing support to dorsum of R hand and digits to keep other digits from tapping during single digit taps.  Performed active assisted digit abd/add, with manual assist for maintaining flat palm to reduce digit flexion, and assist to achieve end range.  Cued for slow speed for better motor control.  Intermittent rest, WB with hand on table top, and passive wrist and digit extension stretch to reduce flexor tone  in hand between activities and reps.  Practiced picking up clothespins from table top and placing into bucket placed at various surface levels between table top and shoulder level to promote forward and lateral reaching with grasp/release patterns.  Min vc for grasp/release sequence and technique.  Practiced grasp/release with attempt at 2 point and 3 point pinch to remove square blocks from velcro board, but ultimately more successful to remove when incorporating all digits to pull with sufficient force.  ? ?Therapeutic Exercise: ?Worked with light resistance clothespins, yellow, red, and green.  Performed 10 reps of lateral pinching for each color, with OT providing set up of clip in hand and providing support to keep fingers in place to control a lateral  pinch without digits sliding off clip.  Practiced removing clips from vertical dowel, though pt used more of a palmar grasp to pull pins off dowel.   ? ?Response to Treatment: ?Pt making steady gains with grasp/release and digit isolation exercises.  Pt is requiring less manual assist to keep digits down during single digit taps.  Pt was able to perform digit abd/add with less digit flexion during add of digits.  Pt reports that he can see the improvement, but verbalizes frustration with how slow he feels he is improving.  Pt will continue to benefit from skilled OT for maximizing strength and coordination throughout RUE to better engage dominant arm during daily tasks ? ? ? OT Education - 08/22/21 1621   ? ? Education Details Isolating the 2nd digit, 2pt. point grasp patterns.   ? Person(s) Educated Patient   ? Methods Explanation;Demonstration;Tactile cues;Verbal cues   ? Comprehension Verbalized understanding;Returned demonstration;Verbal cues required;Tactile cues required;Need further instruction   ? ?  ?  ? ?  ? ? ? OT Short Term Goals - 08/10/21 1330   ? ?  ? OT SHORT TERM GOAL #1  ? Title Pt. wil demonstrtae independence with HEPs   ? Baseline Eval: Pt. currently does not have one, 10th visit:  changes in HEP ongoing; 20th: instructed in putty exercises, changes ongoing   ? Time 6   ? Period Weeks   ? Status On-going   ? Target Date 09/20/21   ? ?  ?  ? ?  ? ? ? ? OT Long Term Goals - 08/10/21 1331   ? ?  ? OT LONG TERM GOAL #1  ? Title Pt. will improve RUE strength by 2 mm grades to assist with ADLs, and IADLs,   ? Baseline Eval: Right shoulder flexion, abduction 3/5, elbow flexion, extension wrist extension 3+/5, 10th visit: improving with RUE strength by not yet met goal; 20th visit: R shd flex/abd 4-, elbow flex/ext 4+, wrist flex 4-, wrist ext 4+   ? Time 12   ? Period Weeks   ? Status On-going   ? Target Date 11/01/21   ?  ? OT LONG TERM GOAL #2  ? Title Pt. will improve right grip by 10 lbs to prepare  for firmly holding objects for IADLs.   ? Baseline Eval: R: 38#, L: 124# (In 3rd dynamometer slot); R 35# in 4th slot, 28# in 3rd slot   ? Time 12   ? Period Weeks   ? Status On-going   ? Target Date 11/01/21   ?  ? OT LONG TERM GOAL #3  ? Title Pt. will improve right lateral pinch strength by 5# to assist with cutting food   ? Baseline Eval: R: 17, L: 25; 20th: R:  18   ? Time 12   ? Period Weeks   ? Status On-going   ? Target Date 11/01/21   ?  ? OT LONG TERM GOAL #4  ? Title Pt. will improve Right 3pt pinch by 2# to be able to hold/open items for cooking   ? Baseline Eval: R: Pt. unable to engage thumb L: 29#; 20th: R unable   ? Time 12   ? Period Weeks   ? Status On-going   ? Target Date 11/01/21   ?  ? OT LONG TERM GOAL #5  ? Title Pt. will improve right hand [facility] skills to be able to independently manipulate buttons, and zippers.   ? Baseline Eval: Pt. has difficulty managing buttons,a nd zippers. 10th visit:  improving; 20th: unable to engage R hand effectively, performs 1 handed with L   ? Time 12   ? Period Weeks   ? Status On-going   ? Target Date 11/01/21   ?  ? OT LONG TERM GOAL #6  ? Title Pt. will independently write his name   ? Baseline Eval: Pt. is unable to hold a pen; 20th: pt can grip PenAgain with R hand and can make marks on paper with difficulty, but not yet writing name   ? Time 12   ? Period Weeks   ? Status On-going   ? Target Date 11/01/21   ?  ? OT LONG TERM GOAL #7  ? Title Pt. will improve FOTO score by 2 points to reflect funational improvement   ? Baseline Eval: FOTO score 46 with TR score 52; 20th: 45   ? Time 12   ? Period Weeks   ? Status On-going   ? Target Date 11/01/21   ? ?  ?  ? ?  ? ? ? Plan - 08/22/21 1740   ? ? Clinical Impression Statement Pt making steady gains with grasp/release and digit isolation exercises.  Pt is requiring less manual assist to keep digits down during single digit taps.  Pt was able to perform digit abd/add with less digit flexion during add of digits.   Pt reports that he can see the improvement, but verbalizes frustration with how slow he feels he is improving.  Pt will continue to benefit from skilled OT for maximizing strength and coordination thro

## 2021-08-22 NOTE — Therapy (Signed)
?[facility] ?[place], [place], [address] ?Phone: [phone]   Fax:  [phone] ? ?Speech Language Pathology Treatment ? ?Patient Details  ?Name: David Reeves ?MRN: [id number] ?Date of Birth: Dec 28, 1970 ?Referring Provider (SLP): [name] ? ? ?Encounter Date: 08/22/2021 ? ? End of Session - 08/22/21 1743   ? ? Visit Number 16   ? Number of Visits 25   ? Date for SLP Re-Evaluation 08/27/21   ? Authorization Type [employer] H[employer]   ? Authorization Time Period 06/04/2021 thru 08/27/2021   ? Authorization - Visit Number 6   ? Progress Note Due on Visit 10   ? SLP Start Time 0800   ? SLP Stop Time  0900   ? SLP Time Calculation (min) 60 min   ? Activity Tolerance Patient tolerated treatment well;No increased pain   ? ?  ?  ? ?  ? ? ?Past Medical History:  ?Diagnosis Date  ? Back pain 04/22/2012  ? Bone spur   ? Bulging disc 04/22/2012  ? Degenerative disc disease   ? Osteoarthritis   ? Panic anxiety syndrome   ? Stroke [facility]) 10/2020  ? Taking multiple medications for chronic disease   ? ? ?Past Surgical History:  ?Procedure Laterality Date  ? CYSTECTOMY    ? head  ? HERNIA REPAIR Left 2006,2014  ? [facility]  ? TOE SURGERY Right 2007  ? ? ?There were no vitals filed for this visit. ? ? Subjective Assessment - 08/22/21 1737   ? ? Subjective Patient arrived eager and motivated to improve   ? Pain Score 3    ? Pain Location Back   ? Pain Type Chronic pain   ? Pain Relieving Factors heating pad provided as requested by patient   ? ?  ?  ? ?  ? ? ? ? ? ? ? ? ADULT SLP TREATMENT - 08/22/21 0001   ? ?  ? Treatment Provided  ? Treatment provided Cognitive-Linquistic   ?  ? Cognitive-Linquistic Treatment  ? Treatment focused on Apraxia;Patient/family/caregiver education   ? Skilled Treatment Skilled treatment targeted improved speech intelligibility. SLP facilitated session by providing the following interventions: Cued implementation of speech  intelligibility strategies in conversational speech and functional reading.  Patient demonstrated independent use of speech strategies in conversation with only x7 errors requiring visual cues by SLP across a 60 minutes conversation, all other errors were independently repaired by patient directed use of pacing strategy (external tactile pacer board). Patient demonstrated verbal paragraph reading with 40% accuracy x10 paragraphs. Patient independently utilized pacing strategy across paragraphs though required SLP visual/verbal cues for multi-syllabic production of words. Patient frequently switched words changing meaning of reading without awareness of these errors, required SLP direction to error then patient able to self correct.   ?  ? Assessment / Recommendations / Plan  ? Plan Continue with current plan of care   ? ?  ?  ? ?  ? ? ? SLP Education - 08/22/21 1743   ? ? Education Details strategies ; HEP   ? Person(s) Educated Patient   ? Methods Explanation;Demonstration   ? Comprehension Verbalized understanding;Need further instruction;Returned demonstration   ? ?  ?  ? ?  ? ? ? SLP Short Term Goals - 07/28/21 1830   ? ?  ? SLP SHORT TERM GOAL #1  ? Title Pt will use the speech intelligibility strategy of pacing to slow rate of speech at  the simple sentence level in 8 out of 10 opportunities.   ? Time 10   ? Period --   sessions  ? Status On-going   ?  ? SLP SHORT TERM GOAL #2  ? Title Pt will produce fricatives in the initial, final and medial positions of words with 90% accuracy.   ? Time 10   ? Period --   sessions  ? Status On-going   ?  ? SLP SHORT TERM GOAL #3  ? Title Pt will use speech intelligibility strategy of overarticulation to improve speech intelligibility to 75% at the simple sentence level.   ? Time 10   ? Period --   sessions  ?  ? SLP SHORT TERM GOAL #4  ? Title Pt will answer [place] questions after reading paragraph level passage with 75% accuracy.   ? Time 10   ? Period --   sessions  ? Status  On-going   ? ?  ?  ? ?  ? ? ? SLP Long Term Goals - 07/28/21 1831   ? ?  ? SLP LONG TERM GOAL #1  ? Title Pt will use speech intelligibility stategies to achieve 50% speech intelligibility at the sentence level.   ? Status On-going   ? Target Date 08/27/21   ? ?  ?  ? ?  ? ? ? Plan - 08/22/21 1744   ? ? Clinical Impression Statement Pt presents with mild-moderate apraxia and dysarthria impacting functional communication. Intervention targeted speech intelligibility in conversation and functional reading as above. Patient benefits from verbal cues for strategy use and visual models for production/communication. Patient continues to benefit from skilled ST intervention to improve speech and communication for independence in home/community environment and improve quality of life   ? Speech Therapy Frequency 2x / week   ? Duration 12 weeks   ? Treatment/Interventions Language facilitation;Compensatory techniques;Cueing hierarchy;Internal/external aids;SLP instruction and feedback;Patient/family education;Compensatory strategies   ? Potential to Achieve Goals Good   ? Potential Considerations Other (comment);Severity of impairments   ? SLP Home Exercise Plan provided, see pt instructions section   ? ?  ?  ? ?  ? ? ?Patient will benefit from skilled therapeutic intervention in order to improve the following deficits and impairments:   ?Apraxia ? ? ? ?Problem List ?Patient Active Problem List  ? Diagnosis Date Noted  ? Alterations of sensations following cerebrovascular accident 03/08/2021  ? Aphasia as late effect of cerebrovascular accident (CVA) 03/08/2021  ? Muscle spasm 02/22/2021  ? Acute non-recurrent frontal sinusitis 02/22/2021  ? Chronic pain syndrome 01/23/2021  ? Primary hypertension 01/23/2021  ? Nerve pain 01/23/2021  ? Erectile dysfunction due to diseases classified elsewhere 01/23/2021  ? Cognitive dysfunction 01/23/2021  ? Scrotal edema 01/23/2021  ? Elevated LDL cholesterol level 01/23/2021  ? History of  stroke with residual deficit 01/23/2021  ? Primary insomnia 01/23/2021  ? Mouth pain 01/23/2021  ? Laceration of right hand without foreign body   ? Closed compression fracture of L1 vertebra ([place]) 05/24/2016  ? Lumbar stenosis with neurogenic claudication 11/01/2015  ? Chronic 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 11/08/2014  ? Hx of hemorrhoids 11/08/2014  ? AAA (abdominal aortic aneurysm) ([place]) 09/22/2014  ? Chronic pain associated with significant psychosocial dysfunction 09/22/2014  ? Panic disorder 09/22/2014  ? AB (asthmatic bronchitis) 08/17/2014  ? Anxiety 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 08/17/2014  ? Backache 08/17/2014  ? Lumbosacral spondylosis without myelopathy 08/17/2014  ? Disorder of male  genital organs 08/17/2014  ? Brash 08/17/2014  ? Low back pain 08/17/2014  ? Tendon nodule 08/17/2014  ? Episodic paroxysmal anxiety disorder 08/17/2014  ? Hernia, inguinal, right 08/17/2014  ? Fast heart beat 08/17/2014  ? Illness 08/17/2014  ? Inguinal hernia 10/14/2012  ? ?[place] L. Cowan Pilar, M.A. CCC-SLP ?Adult-based Speech Language Pathologist ?[facility] ?[phone] ? ?Dalbert Batman, CCC-SLP ?08/22/2021, 5:45 PM ? ?[place] [facility] ?[place], [place], [address] ?Phone: [phone]   Fax:  [phone] ? ? ?Name: David Reeves ?MRN: [id number] ?Date of Birth: 1970/09/10 ? ?

## 2021-08-22 NOTE — Patient Instructions (Signed)
Utilize strategies with home conversation, practice reading at home with cookbooks   ?

## 2021-08-27 ENCOUNTER — Encounter: Payer: Self-pay | Admitting: Occupational Therapy

## 2021-08-27 ENCOUNTER — Ambulatory Visit: Payer: Medicare HMO | Admitting: Speech Pathology

## 2021-08-27 ENCOUNTER — Encounter: Payer: Self-pay | Admitting: Physical Therapy

## 2021-08-27 ENCOUNTER — Ambulatory Visit: Payer: Medicare HMO | Admitting: Physical Therapy

## 2021-08-27 ENCOUNTER — Ambulatory Visit: Payer: Medicare HMO | Admitting: Occupational Therapy

## 2021-08-27 DIAGNOSIS — R262 Difficulty in walking, not elsewhere classified: Secondary | ICD-10-CM

## 2021-08-27 DIAGNOSIS — M545 Low back pain, unspecified: Secondary | ICD-10-CM

## 2021-08-27 DIAGNOSIS — M6281 Muscle weakness (generalized): Secondary | ICD-10-CM | POA: Diagnosis not present

## 2021-08-27 DIAGNOSIS — R2681 Unsteadiness on feet: Secondary | ICD-10-CM

## 2021-08-27 DIAGNOSIS — F09 Unspecified mental disorder due to known physiological condition: Secondary | ICD-10-CM | POA: Diagnosis not present

## 2021-08-27 DIAGNOSIS — R482 Apraxia: Secondary | ICD-10-CM

## 2021-08-27 DIAGNOSIS — R269 Unspecified abnormalities of gait and mobility: Secondary | ICD-10-CM | POA: Diagnosis not present

## 2021-08-27 DIAGNOSIS — R278 Other lack of coordination: Secondary | ICD-10-CM | POA: Diagnosis not present

## 2021-08-27 DIAGNOSIS — R41841 Cognitive communication deficit: Secondary | ICD-10-CM | POA: Diagnosis not present

## 2021-08-27 DIAGNOSIS — Z8673 Personal history of transient ischemic attack (TIA), and cerebral infarction without residual deficits: Secondary | ICD-10-CM

## 2021-08-27 NOTE — Therapy (Signed)
[facility] ?[place], [place], [address] ?Phone: [phone]   Fax:  [phone] ? ?Occupational Therapy Treatment ? ?Patient Details  ?Name: David Reeves ?MRN: [id number] ?Date of Birth: Mar 19, 1971 ?Referring Provider (OT): [name] ? ? ?Encounter Date: 08/27/2021 ? ? OT End of Session - 08/27/21 1123   ? ? Visit Number 25   ? Number of Visits 44   ? Date for OT Re-Evaluation 11/01/21   ? Authorization Type Progress reporting period starting 08/10/21   ? OT Start Time 0915   ? OT Stop Time 1000   ? OT Time Calculation (min) 45 min   ? Activity Tolerance Patient tolerated treatment well   ? Behavior During Therapy [facility] for tasks assessed/performed   ? ?  ?  ? ?  ? ? ?Past Medical History:  ?Diagnosis Date  ? Back pain 04/22/2012  ? Bone spur   ? Bulging disc 04/22/2012  ? Degenerative disc disease   ? Osteoarthritis   ? Panic anxiety syndrome   ? Stroke [facility]) 10/2020  ? Taking multiple medications for chronic disease   ? ? ?Past Surgical History:  ?Procedure Laterality Date  ? CYSTECTOMY    ? head  ? HERNIA REPAIR Left 2006,2014  ? [facility]  ? TOE SURGERY Right 2007  ? ? ?There were no vitals filed for this visit. ? ? Subjective Assessment - 08/27/21 1123   ? ? Subjective  "I'm seeing the improvement.  It's just slow."   ? Patient is accompanied by: Family member   ? Pertinent History Pt. is a 51 y.o. who was diagnosed with a CVA on July 21st, 2022. Pt. completed several weeks of  inpatient rehab at [facility]. After returning to home, Pt. sustained a fall in december of 2022, and was admitted to the hospital with COVI-19, and back pain from the fall. Since the most recent discharge, Pt. has been residing with the ex-wife until the Pt. is ready to return to independent living. Pt. PMHx includes: Bilateral LBP with right sided sciatica, Lumbosacral spondylosis myelopathy, closed compression Fx of L1. Pt. enjoys cooking, and riding motorcycles.   ?  Patient Stated Goals Pt would like to be as independent as he was before, improve hand function.   ? Currently in Pain? Yes   ? Pain Score 3    ? Pain Location Back   ? Pain Orientation Lower   ? Pain Descriptors / Indicators Aching;Sore   ? Pain Type Chronic pain   ? Pain Onset More than a month ago   ? ?  ?  ? ?  ? ? ? ?OT TREATMENT   ?  ?Neuro muscular re-education: ?  ?Pt. worked on right hand [facility] skills grasping 1&1/2" pegs vertically from the tabletop. Pt. Worked on moving them from the tabletop to the pegboard. Pt. Worked on isolating the right 2nd digit in preparation for pressing them into place on the first row of the resistive board. ?  ?Therapeutic Exercise: ?  ?Pt. worked on [employer], and reciprocal motion using the UBE while seated for 8 min. with minimal resistance. Constant monitoring was provided, and an ACE wrap was utilized to secure his right hand onto the UBE handle.Pt. performed gross gripping with a gross grip strengthener. Pt. worked on sustaining grip while grasping pegs and reaching at various heights. The Gripper was set to  6.6# of grip strength resistance.  ?  ?Pt. reports 3/10 low back  pain this morning. Pt. continues to progress with the RUE grip, and was able to clear the board. Pt. Has improved with right grasp patterns, however required the peg to be placed on a nonskid resistive mat. Pt. required tactile cues, and assist to formulate isolated 2nd digit extension in preparation from pressing the cubes in place. Pt. continues to work on improving RUE strength, and [facility] skills in order to work towards improving, and maximizing independence with ADLs, and IADL tasks. ?  ?  ? ?  ? ? ? ? ? ? ? ? ? ? ? ? ? ? ? ? ? ? ? ? ? OT Education - 08/27/21 1123   ? ? Education Details Isolating the 2nd digit, 2pt. point grasp patterns.   ? Person(s) Educated Patient   ? Methods Explanation;Demonstration;Tactile cues;Verbal cues   ? Comprehension Verbalized understanding;Returned  demonstration;Verbal cues required;Tactile cues required;Need further instruction   ? ?  ?  ? ?  ? ? ? OT Short Term Goals - 08/10/21 1330   ? ?  ? OT SHORT TERM GOAL #1  ? Title Pt. wil demonstrtae independence with HEPs   ? Baseline Eval: Pt. currently does not have one, 10th visit:  changes in HEP ongoing; 20th: instructed in putty exercises, changes ongoing   ? Time 6   ? Period Weeks   ? Status On-going   ? Target Date 09/20/21   ? ?  ?  ? ?  ? ? ? ? OT Long Term Goals - 08/10/21 1331   ? ?  ? OT LONG TERM GOAL #1  ? Title Pt. will improve RUE strength by 2 mm grades to assist with ADLs, and IADLs,   ? Baseline Eval: Right shoulder flexion, abduction 3/5, elbow flexion, extension wrist extension 3+/5, 10th visit: improving with RUE strength by not yet met goal; 20th visit: R shd flex/abd 4-, elbow flex/ext 4+, wrist flex 4-, wrist ext 4+   ? Time 12   ? Period Weeks   ? Status On-going   ? Target Date 11/01/21   ?  ? OT LONG TERM GOAL #2  ? Title Pt. will improve right grip by 10 lbs to prepare for firmly holding objects for IADLs.   ? Baseline Eval: R: 38#, L: 124# (In 3rd dynamometer slot); R 35# in 4th slot, 28# in 3rd slot   ? Time 12   ? Period Weeks   ? Status On-going   ? Target Date 11/01/21   ?  ? OT LONG TERM GOAL #3  ? Title Pt. will improve right lateral pinch strength by 5# to assist with cutting food   ? Baseline Eval: R: 17, L: 25; 20th: R: 18   ? Time 12   ? Period Weeks   ? Status On-going   ? Target Date 11/01/21   ?  ? OT LONG TERM GOAL #4  ? Title Pt. will improve Right 3pt pinch by 2# to be able to hold/open items for cooking   ? Baseline Eval: R: Pt. unable to engage thumb L: 29#; 20th: R unable   ? Time 12   ? Period Weeks   ? Status On-going   ? Target Date 11/01/21   ?  ? OT LONG TERM GOAL #5  ? Title Pt. will improve right hand [facility] skills to be able to independently manipulate buttons, and zippers.   ? Baseline Eval: Pt. has difficulty managing buttons,a nd zippers. 10th visit:   improving; 20th:  unable to engage R hand effectively, performs 1 handed with L   ? Time 12   ? Period Weeks   ? Status On-going   ? Target Date 11/01/21   ?  ? OT LONG TERM GOAL #6  ? Title Pt. will independently write his name   ? Baseline Eval: Pt. is unable to hold a pen; 20th: pt can grip [employer] with R hand and can make marks on paper with difficulty, but not yet writing name   ? Time 12   ? Period Weeks   ? Status On-going   ? Target Date 11/01/21   ?  ? OT LONG TERM GOAL #7  ? Title Pt. will improve FOTO score by 2 points to reflect funational improvement   ? Baseline Eval: FOTO score 46 with TR score 52; 20th: 45   ? Time 12   ? Period Weeks   ? Status On-going   ? Target Date 11/01/21   ? ?  ?  ? ?  ? ? ? ? ? ? ? ? Plan - 08/27/21 1124   ? ? Clinical Impression Statement Pt. reports 3/10 low back pain this morning. Pt. continues to progress with the RUE grip, and was able to clear the board. Pt. Has improved with right grasp patterns, however required the peg to be placed on a nonskid resistive mat. Pt. required tactile cues, and assist to formulate isolated 2nd digit extension in preparation from pressing the cubes in place. Pt. continues to work on improving RUE strength, and [facility] skills in order to work towards improving, and maximizing independence with ADLs, and IADL tasks. ?   ? OT Occupational Profile and History Detailed Assessment- Review of Records and additional review of physical, cognitive, psychosocial history related to current functional performance   ? Occupational performance deficits (Please refer to evaluation for details): ADL's;IADL's;Education   ? Rehab Potential Good   ? Clinical Decision Making Several treatment options, min-mod task modification necessary   ? Comorbidities Affecting Occupational Performance: May have comorbidities impacting occupational performance   ? Modification or Assistance to Complete Evaluation  Min-Moderate modification of tasks or assist with assess  necessary to complete eval   ? OT Frequency 2x / week   ? OT Duration 12 weeks   ? OT Treatment/Interventions Self-care/ADL training;DME and/or AE instruction;Therapeutic exercise;Ultrasound;Neuromuscular education;Therapeut

## 2021-08-27 NOTE — Therapy (Signed)
[place] ?[place], [place], [address] ?Phone: [phone]   Fax:  [phone] ? ?Physical Therapy Treatment ? ?Patient Details  ?Name: David Reeves ?MRN: [id number] ?Date of Birth: February 09, 1971 ?Referring Provider (PT): [name] PA ? ? ?Encounter Date: 08/27/2021 ? ? PT End of Session - 08/27/21 1028   ? ? Visit Number 18   ? Number of Visits 41   ? Date for PT Re-Evaluation 11/12/21   ? Authorization Type [employer]- authorized 16 visits 4/4-6/1   ? Authorization - Visit Number 5   ? Authorization - Number of Visits 16   ? Progress Note Due on Visit 20   ? PT Start Time 1018   ? PT Stop Time 1100   ? PT Time Calculation (min) 42 min   ? Activity Tolerance Patient tolerated treatment well;No increased pain   ? Behavior During Therapy [facility] for tasks assessed/performed   ? ?  ?  ? ?  ? ? ?Past Medical History:  ?Diagnosis Date  ? Back pain 04/22/2012  ? Bone spur   ? Bulging disc 04/22/2012  ? Degenerative disc disease   ? Osteoarthritis   ? Panic anxiety syndrome   ? Stroke [facility]) 10/2020  ? Taking multiple medications for chronic disease   ? ? ?Past Surgical History:  ?Procedure Laterality Date  ? CYSTECTOMY    ? head  ? HERNIA REPAIR Left 2006,2014  ? [facility]  ? TOE SURGERY Right 2007  ? ? ?There were no vitals filed for this visit. ? ? Subjective Assessment - 08/27/21 1027   ? ? Subjective Patient reports he feels he is making progress- Walking better and no falls.He is still having chronic back pain. He is scheduled to see surgeon on 6/29   ? Pertinent History Pt. is a 51 y.o. who was diagnosed with a CVA on July 21st, 2022. Pt. completed several weeks of  inpatient rehab at [facility]. After returning to home. He was discharged in late August/early September and recieved home health PT. Pt. sustained a fall in december of 2022, and was admitted to the hospital with COVID-19, and Chronic back pain. He reports chronic back pain syndrome  since 2012. Since the most recent discharge, Pt. has been residing with the ex-wife until the Pt. is ready to return to independent living. He did recieve 2 weeks of home health after discharge in December 2022. He is now being referred to outpatient PT to address weakness from stroke and improve fine motor movement. He reports rarely getting numbness/tingling in RLE. PMHx includes: Bilateral LBP with right sided sciatica, Lumbosacral spondylosis myelopathy, closed compression Fx of L1. Has chronic insomnia. osteoarthritis, Panic anxiety syndrome (taking medication) Pt. enjoys cooking, and riding motorcycles. Patient is going to [facility] spine center for Epidural spinal injections on 06/07/21;   ? Limitations Sitting;Walking   ? How long can you sit comfortably? 20 min;   ? How long can you stand comfortably? 5-10 min;   ? How long can you walk comfortably? able to walk long ways (>500 feet)   ? Diagnostic tests MRI Nov 2022- Stable chronic burst L1 fracture, progressive disc degeneration with disc bulging  eccentric to the left at L3-4 and L4-5. spinal stenosis at L5-S1;   ? Patient Stated Goals "I Need all the help  I can get. I want to move around better and reduce my back pain."   ? Currently in Pain? Yes   ?  Pain Score 3    ? Pain Location Back   ? Pain Orientation Lower   ? Pain Descriptors / Indicators Aching;Sore   ? Pain Type Chronic pain   ? Pain Onset More than a month ago   ? Pain Frequency Constant   ? Aggravating Factors  bending, lifting, prolonged sitting   ? Pain Relieving Factors heat   ? Effect of Pain on Daily Activities decreased activity tolerance;   ? ?  ?  ? ?  ? ? ? ? ?  ?Patient lying in supine with moist heat to low back.  ? Legs apart: Windshield wiper trunk rotation stretch x5 reps each x2 sets; ?Hamstring stretch (BLE) x 30 sec x1 each leg.  ?            Progressed to hamstring neural stretch with ankle DF/PF x10 reps each LE, moderate tightness noted on RLE;  ?Knee to chest (BLE) x20 sec x1  each leg ?Manual hip circles (CW/CCW) x 5 reps each direction and each LE ?LE crossed over contralateral LE for IT band stretch x5 sec hold x5 reps each LE;  ?  ? ?Patient tolerated stretches well reporting a reduction in back stiffness. He is still having pain but reports less stiffness;  ? ?Hooklying: ?Green tband around BLE: ?-hip flexion march x10 reps each LE ?-BLE hip abduction/ER x15 reps ?-bridge with legs apart to challenge gluteal activation x15 reps; ?Patient would have difficulty don/doff green tband at home along due to limited UE use/hemiparesis and chronic back pain  ?-SLR hip flexion x10 reps with patient exhibiting good control and positioning; ? ? ?Instructed patient in advanced HEP: ?Modified SLS on firm surface 60 sec- minimal difficulty ? Progressed to head turns side/side x5 reps, up/down x5 reps ?Patient able to exhibit good stance control. He does report fatigue with prolonged standing; ?Forward lunges x10 reps each LE with rail assist ?Single leg heel raises x10 reps each LE, exhibits significant difficulty on RLE; ? ?Patient tolerated session well. He denies any increase in back pain but reports his pain has not decreased. He reports overall exercises are getting easier as he is getting stronger. ? ? ? ? ? ? ? ? ? ? ? ? ? ? ? ? ? ? ? PT Education - 08/27/21 1028   ? ? Education Details LE strengthening/ROM   ? Person(s) Educated Patient   ? Methods Explanation;Verbal cues   ? Comprehension Verbalized understanding;Returned demonstration;Verbal cues required;Need further instruction   ? ?  ?  ? ?  ? ? ? PT Short Term Goals - 08/20/21 1001   ? ?  ? PT SHORT TERM GOAL #1  ? Title Patient will be adherent to HEP at least 3x a week to improve functional strength and balance for better safety at home.   ? Baseline 4/4: doing them 1-2x a week; 08/20/2021=Patient reports performing his low back stretching and no questions currently HEP.   ? Time 4   ? Period Weeks   ? Status Achieved   ? Target Date  08/21/21   ?  ? PT SHORT TERM GOAL #2  ? Title Patient (< 65 years old) will complete five times sit to stand test in < 15 seconds indicating an increased LE strength and improved balance.   ? Baseline 4/4: 17.85 sec with arms across chest; 08/20/2021= 17.6 sec   ? Time 4   ? Period Weeks   ? Status On-going   ? Target Date 10/01/21   ? ?  ?  ? ?  ? ? ? ?  PT Long Term Goals - 08/20/21 1001   ? ?  ? PT LONG TERM GOAL #1  ? Title Patient will increase six minute walk test distance to >1000 for progression to community ambulator and improve gait ability   ? Baseline 4/4: 1115 feet   ? Time 4   ? Period Weeks   ? Status Achieved   ? Target Date 08/21/21   ?  ? PT LONG TERM GOAL #2  ? Title Patient will ascend/descend 8 stairs without rail assist independently without loss of balance to improve ability to get in/out of home.   ? Baseline 4/4: requires 1 rail assist; 08/20/2021- Patient demonstrated steps without railing using reciprocal steps today.   ? Time 4   ? Period Weeks   ? Status Achieved   ? Target Date 08/21/21   ?  ? PT LONG TERM GOAL #3  ? Title Patient will increase BLE gross strength to 4+/5 as to improve functional strength for independent gait, increased standing tolerance and increased ADL ability.   ? Baseline 4/4: not formally assessed; 08/20/2021= 4+/5 with Right hip flex/knee ext/flex; Right hip abd= 4/5   ? Time 12   ? Period Weeks   ? Status Partially Met   ? Target Date 11/12/21   ?  ? PT LONG TERM GOAL #4  ? Title Patient will improve FOTO score to >50% to indicate improved functional mobility with less pain with ADLs.   ? Baseline 4/4: 35%; 08/20/2021= 42%   ? Time 12   ? Period Weeks   ? Status On-going   ? Target Date 11/12/21   ?  ? PT LONG TERM GOAL #5  ? Title Patient will report a worst pain of 4/10 in low back over last week to indicate improved tolerance with ADLs.   ? Baseline 4/4: 7/10; 08/20/2021= 4/10   ? Time 12   ? Period Weeks   ? Status Partially Met   ? Target Date 11/12/21   ?  ?  Additional Long Term Goals  ? Additional Long Term Goals Yes   ?  ? PT LONG TERM GOAL #6  ? Title Pt will increase 10MWT by at least 0.13 m/s in order to demonstrate clinically significant improvement in comm

## 2021-08-27 NOTE — Patient Instructions (Addendum)
Access Code: [email or web address] ?URL: [email or web address] ?Date: 08/27/2021 ?Prepared by: Zettie Pho ? ?Exercises ?- Foot Taps on Steps of Various Heights and Placements (BKA)  - 1 x daily - 7 x weekly - 1 sets - 5 reps - 60 sec hold ?- Lunge with Counter Support  - 1 x daily - 7 x weekly - 1 sets - 10 reps ?- Single Leg Heel Raise with Counter Support  - 1 x daily - 7 x weekly - 1 sets - 10 reps ?

## 2021-08-28 ENCOUNTER — Encounter: Payer: Self-pay | Admitting: Physician Assistant

## 2021-08-28 NOTE — Therapy (Signed)
[place] ?[facility] ?[place], [place], [address] ?Phone: [phone]   Fax:  [phone] ? ?Speech Language Pathology Treatment ? ?Patient Details  ?Name: David Reeves ?MRN: [id number] ?Date of Birth: [birth year] ?Referring Provider (SLP): [name] ? ? ?Encounter Date: 08/27/2021 ? ? End of Session - 08/28/21 2047   ? ? Visit Number 17   ? Number of Visits 42   ? Date for SLP Re-Evaluation 11/20/21   ? Authorization Type [employer] H[employer]   ? Authorization Time Period 08/27/2021 thru 11/20/2021   ? Authorization - Visit Number 7   ? Progress Note Due on Visit 10   ? SLP Start Time 0800   ? SLP Stop Time  0900   ? SLP Time Calculation (min) 60 min   ? Activity Tolerance Patient tolerated treatment well   ? ?  ?  ? ?  ? ? ?Past Medical History:  ?Diagnosis Date  ? Back pain 04/22/2012  ? Bone spur   ? Bulging disc 04/22/2012  ? Degenerative disc disease   ? Osteoarthritis   ? Panic anxiety syndrome   ? Stroke [facility]) 10/2020  ? Taking multiple medications for chronic disease   ? ? ?Past Surgical History:  ?Procedure Laterality Date  ? CYSTECTOMY    ? head  ? HERNIA REPAIR Left 2006,2014  ? [facility]  ? TOE SURGERY Right 2007  ? ? ?There were no vitals filed for this visit. ? ? Subjective Assessment - 08/28/21 2046   ? ? Subjective I get fatigued easily when I read, word scramble, I started adding and leaving out words   ? Currently in Pain? No/denies   ? ?  ?  ? ?  ? ? ? ? ? ? ? ? ADULT SLP TREATMENT - 08/28/21 0001   ? ?  ? Treatment Provided  ? Treatment provided Cognitive-Linquistic   ?  ? Cognitive-Linquistic Treatment  ? Treatment focused on Cognition;Patient/family/caregiver education   ? Skilled Treatment Skilled treatment session focused on gathering further information about pt's cognitive communication functioning.    ? ?Use of speech intelligibility strategies:    ?Moderate speech intelligibility without pacing board, pt Mod I with using  pacing board during dysfluencies on his phone     ? ?Reading comprehension:    ?Pt reports that "when reading a headline, I have to read it 2-3 times and sometimes I understand it and sometimes I don't"     ? ?Pt participated in a driving assessment on 08/16/2021 with reported concern for memory deficits, divided attention, set shifting and low processing speed.     ? ?The HVLT-R is a list learning test, which consists of 12 nouns within three semantic groups. The test has three learning trials in which the administrator reads the words aloud and then asks the patient to repeat as many as he/she can remember in any order. The three learning trials are used to calculate a Total Recall Score and Recognition Discrimination Index (RDI). ? ?Immediate Recall: ?Trial 1: 4 (mean 5.8 / SD: 1.9)   ?Trial 2: 7 (mean 8.3 / SD: 2) ?Trial 3: 8 5 (mean 9.4 / SD: 2) ?Total Recall Score:  19 (mean 23.4/ SD 5.2) ? ?Delayed Recognition ?True positive responses: 12  ?False-positive errors: 2  ?Recognition Discrimination Index (RDI) - 10 (mean 11) ? ?Pt's performance was below the average range ?  ? ?  ?  ? ?  ? ? ? SLP  Education - 08/28/21 2047   ? ? Education Details results of [facility] Verbal Learning Test   ? Person(s) Educated Patient   ? Methods Explanation   ? Comprehension Need further instruction   ? ?  ?  ? ?  ? ? ? SLP Short Term Goals - 08/28/21 2052   ? ?  ? SLP SHORT TERM GOAL #1  ? Title Pt will use speech intelligibility strategies to slow rate of speech at the conversational level to achive ~ 50% speech intelligibility.   ? Baseline previous goal met, goal advanced to reflect progress   ? Time 10   ? Period --   sessions  ? Status Revised   ?  ? SLP SHORT TERM GOAL #2  ? Title Pt will produce fricatives in the initial, final and medial positions of words with 90% accuracy.   ? Time 10   ? Period --   sessions  ? Status On-going   ?  ? SLP SHORT TERM GOAL #3  ? Title Pt will use speech intelligibility strategy of  overarticulation to improve speech intelligibility to 75% at the simple sentence level.   ? Status Achieved   ?  ? SLP SHORT TERM GOAL #4  ? Title Pt will answer WH questions after reading short story (5-6 sentences) with 50% accuracy given min to mod cues.   ? Baseline goal not met during reporting period, revised   ? Time 10   ? Period --   sessions  ? Status Revised   ?  ? SLP SHORT TERM GOAL #5  ? Title Pt will a) recall and b) demonstrate use of at least three external memory strategies.   ? Baseline new goal   ? Time 10   ? Period --   sessions  ? Status New   ? ?  ?  ? ?  ? ? ? SLP Long Term Goals - 08/28/21 2056   ? ?  ? SLP LONG TERM GOAL #1  ? Title Pt will use speech intelligibility strategies to achieve 50% speech intelligibility at the simple conversation level.   ? Baseline goal met, revised to reflect progress   ? Time 12   ? Period Weeks   ? Status Revised   ? Target Date 11/20/21   ?  ? SLP LONG TERM GOAL #2  ? Title Patient will demonstrate improved cognitive linguistic function for IND completion of ADLs tasks in home/community environments   ? Baseline new goal   ? Time 12   ? Period Weeks   ? Status New   ? Target Date 11/20/21   ? ?  ?  ? ?  ? ? ? Plan - 08/28/21 2048   ? ? 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 Pt would benefit from further cognitive communication assessments with goals added to plan of care to target these additional deficits. As a result, skilled ST services continue to be indicated in order for pt to return to driving and a higher level of independence.   ? Speech Therapy Frequency 2x / week   ? Duration 12 weeks   ? Treatment/Interventions Language facilitation;Compensatory techniques;Cueing hierarchy;Internal/external aids;SLP instruction and feedback;Patient/family education;Compensatory strategies;Functional tasks;Cognitive reorganization   ? Potential to Achieve Goals Good    ? Potential Considerations Other (comment);Severity of impairments   ? Consulted and Agree with Plan of Care Patient   ? ?  ?  ? ?  ? ? ?  Patient will benefit from skilled therapeutic intervention in order to improve the following deficits and impairments:   ?Cognitive communication deficit ? ?Apraxia ? ?H/O ischemic left MCA stroke ? ?Cognitive dysfunction ? ? ? ?Problem List ?Patient Active Problem List  ? Diagnosis Date Noted  ? Alterations of sensations following cerebrovascular accident 03/08/2021  ? Aphasia as late effect of cerebrovascular accident (CVA) 03/08/2021  ? Muscle spasm 02/22/2021  ? Acute non-recurrent frontal sinusitis 02/22/2021  ? Chronic pain syndrome 01/23/2021  ? Primary hypertension 01/23/2021  ? Nerve pain 01/23/2021  ? Erectile dysfunction due to diseases classified elsewhere 01/23/2021  ? Cognitive dysfunction 01/23/2021  ? Scrotal edema 01/23/2021  ? Elevated LDL cholesterol level 01/23/2021  ? History of stroke with residual deficit 01/23/2021  ? Primary insomnia 01/23/2021  ? Mouth pain 01/23/2021  ? Laceration of right hand without foreign body   ? Closed compression fracture of L1 vertebra ([place]) 05/24/2016  ? Lumbar stenosis with neurogenic claudication 11/01/2015  ? Chronic 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 11/08/2014  ? Hx of hemorrhoids 11/08/2014  ? AAA (abdominal aortic aneurysm) ([place]) 09/22/2014  ? Chronic pain associated with significant psychosocial dysfunction 09/22/2014  ? Panic disorder 09/22/2014  ? AB (asthmatic bronchitis) 08/17/2014  ? Anxiety 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 08/17/2014  ? Backache 08/17/2014  ? Lumbosacral spondylosis without myelopathy 08/17/2014  ? Disorder of male genital organs 08/17/2014  ? Brash 08/17/2014  ? Low back pain 08/17/2014  ? Tendon nodule 08/17/2014  ? Episodic paroxysmal anxiety disorder 08/17/2014  ? Hernia, inguinal, right 08/17/2014  ? Fast heart beat 08/17/2014  ? Illness 08/17/2014  ? Inguinal hernia  10/14/2012  ? ?Giulietta Prokop B. Rutherford Nail, M.S., CCC-SLP, CBIS ?Speech-Language Pathologist ?Rehabilitation Services ?Office [phone] ? ?[place], CCC-SLP ?08/28/2021, 8:59 PM ? ?[place] ?[place]

## 2021-08-29 ENCOUNTER — Ambulatory Visit: Payer: Medicare HMO | Admitting: Speech Pathology

## 2021-08-29 ENCOUNTER — Ambulatory Visit: Payer: Medicare HMO | Admitting: Physical Therapy

## 2021-08-29 ENCOUNTER — Ambulatory Visit: Payer: Medicare HMO

## 2021-08-29 ENCOUNTER — Encounter: Payer: Self-pay | Admitting: Physical Therapy

## 2021-08-29 DIAGNOSIS — M6281 Muscle weakness (generalized): Secondary | ICD-10-CM

## 2021-08-29 DIAGNOSIS — R41841 Cognitive communication deficit: Secondary | ICD-10-CM

## 2021-08-29 DIAGNOSIS — Z8673 Personal history of transient ischemic attack (TIA), and cerebral infarction without residual deficits: Secondary | ICD-10-CM

## 2021-08-29 DIAGNOSIS — R269 Unspecified abnormalities of gait and mobility: Secondary | ICD-10-CM | POA: Diagnosis not present

## 2021-08-29 DIAGNOSIS — R278 Other lack of coordination: Secondary | ICD-10-CM

## 2021-08-29 DIAGNOSIS — G8929 Other chronic pain: Secondary | ICD-10-CM

## 2021-08-29 DIAGNOSIS — R262 Difficulty in walking, not elsewhere classified: Secondary | ICD-10-CM | POA: Diagnosis not present

## 2021-08-29 DIAGNOSIS — F09 Unspecified mental disorder due to known physiological condition: Secondary | ICD-10-CM | POA: Diagnosis not present

## 2021-08-29 DIAGNOSIS — R2681 Unsteadiness on feet: Secondary | ICD-10-CM

## 2021-08-29 DIAGNOSIS — M545 Low back pain, unspecified: Secondary | ICD-10-CM | POA: Diagnosis not present

## 2021-08-29 DIAGNOSIS — R482 Apraxia: Secondary | ICD-10-CM | POA: Diagnosis not present

## 2021-08-29 NOTE — Therapy (Signed)
[facility] ?[place], [place], [address] ?Phone: [phone]   Fax:  [phone] ? ?Physical Therapy Treatment ? ?Patient Details  ?Name: David Reeves ?MRN: [id number] ?Date of Birth: [birth year] ?Referring Provider (PT): [name] PA ? ? ?Encounter Date: 08/29/2021 ? ? PT End of Session - 08/29/21 1112   ? ? Visit Number 19   ? Number of Visits 41   ? Date for PT Re-Evaluation 11/12/21   ? Authorization Type [employer] Medicare- authorized 16 visits 4/4-6/1   ? Authorization - Visit Number 5   ? Authorization - Number of Visits 16   ? Progress Note Due on Visit 20   ? PT Start Time 1018   ? PT Stop Time 1100   ? PT Time Calculation (min) 42 min   ? Activity Tolerance Patient tolerated treatment well;No increased pain   ? Behavior During Therapy [facility] for tasks assessed/performed   ? ?  ?  ? ?  ? ? ?Past Medical History:  ?Diagnosis Date  ? Back pain 04/22/2012  ? Bone spur   ? Bulging disc 04/22/2012  ? Degenerative disc disease   ? Osteoarthritis   ? Panic anxiety syndrome   ? Stroke [facility]) 10/2020  ? Taking multiple medications for chronic disease   ? ? ?Past Surgical History:  ?Procedure Laterality Date  ? CYSTECTOMY    ? head  ? HERNIA REPAIR Left 2006,2014  ? [facility]  ? TOE SURGERY Right 2007  ? ? ?There were no vitals filed for this visit. ? ? Subjective Assessment - 08/29/21 1027   ? ? Subjective Patient reports continued tightness/stiffness in low back. No other changes;   ? Pertinent History Pt. is a 51 y.o. who was diagnosed with a CVA on July 21st, 2022. Pt. completed several weeks of  inpatient rehab at [facility]. After returning to home. He was discharged in late August/early September and recieved home health PT. Pt. sustained a fall in december of 2022, and was admitted to the hospital with COVID-19, and Chronic back pain. He reports chronic back pain syndrome since 2012. Since the most recent discharge, Pt. has been residing with the  ex-wife until the Pt. is ready to return to independent living. He did recieve 2 weeks of home health after discharge in December 2022. He is now being referred to outpatient PT to address weakness from stroke and improve fine motor movement. He reports rarely getting numbness/tingling in RLE. PMHx includes: Bilateral LBP with right sided sciatica, Lumbosacral spondylosis myelopathy, closed compression Fx of L1. Has chronic insomnia. osteoarthritis, Panic anxiety syndrome (taking medication) Pt. enjoys cooking, and riding motorcycles. Patient is going to [facility] spine center for Epidural spinal injections on 06/07/21;   ? Limitations Sitting;Walking   ? How long can you sit comfortably? 20 min;   ? How long can you stand comfortably? 5-10 min;   ? How long can you walk comfortably? able to walk long ways (>500 feet)   ? Diagnostic tests MRI Nov 2022- Stable chronic burst L1 fracture, progressive disc degeneration with disc bulging  eccentric to the left at L3-4 and L4-5. spinal stenosis at L5-S1;   ? Patient Stated Goals "I Need all the help  I can get. I want to move around better and reduce my back pain."   ? Currently in Pain? Yes   ? Pain Score 3    ? Pain Location Back   ? Pain Orientation Lower   ?  Pain Descriptors / Indicators Aching;Sore   ? Pain Type Chronic pain   ? Pain Onset More than a month ago   ? Pain Frequency Constant   ? Aggravating Factors  bending/lifting/prolonged sitting   ? Pain Relieving Factors heat   ? Effect of Pain on Daily Activities decreased activity tolerance;   ? Multiple Pain Sites No   ? ?  ?  ? ?  ? ? ? ? ? ? ?  ?  ?Patient lying in supine with moist heat to low back.  ? Legs apart: Windshield wiper trunk rotation stretch x5 reps each x2 sets; ?Hamstring stretch (BLE) x 30 sec x1 each leg.  ?            Progressed to hamstring neural stretch with ankle DF/PF x10 reps each LE, moderate tightness noted on RLE;  ?Knee to chest (BLE) x20 sec x1 each leg ?Manual hip circles (CW/CCW) x 5  reps each direction and each LE ?LE crossed over contralateral LE for IT band stretch x5 sec hold x5 reps each LE;  ?  ?  ?Patient tolerated stretches well reporting a reduction in back stiffness. He is still having pain but reports less stiffness;  ?  ?Hooklying: ?Green tband around BLE: ?-bridge with legs apart to challenge gluteal activation x15 reps; ?Patient would have difficulty don/doff green tband at home along due to limited UE use/hemiparesis and chronic back pain  ?-SLR hip flexion 2# x12 reps with patient exhibiting good control and positioning; ?  ?Left sidelying: ?RLE hip abduction 2# SLR x10 reps ? Progressed to circles clockwise/counterclockwise x5 reps each; ?Pt has moderate difficulty with decreased motor control of RLE; ?Advanced HEP-see patient instructions;  ? ?Instructed patient in balance exercise: ?Standing in parallel bars: ?Forward/backward step airex to airex over orange hurdle x5 reps (significant difficulty stepping backward, often knocking over hurdle and having difficulty reaching target ? Removed hurdle x5-10 reps with better ease but continues to have decreased accuracy stepping posteriorly especially with RLE ? ?Instructed patient standing in staggered stance (one foot on each airex pad) ?Unsupported standing 30 sec hold - more difficulty with LLE ahead of RLE ? Progressed to head turns side/side x5 reps ? Progressed to trunk rotation side/side x5 reps  ?Required CGA to min A with decreased ankle strategies noted; ?  ?Patient tolerated session well. He denies any increase in back pain but reports his pain has not decreased. He reports overall exercises are getting easier as he is getting stronger. ?  ?  ?  ? ? ? ? ? ? ? ? ? ? ? ? ? ? ? ? ? ? ? ? ? ? PT Education - 08/29/21 1112   ? ? Education Details LE strengthening/ROM   ? Person(s) Educated Patient   ? Methods Explanation;Verbal cues   ? Comprehension Verbalized understanding;Returned demonstration;Verbal cues required;Need  further instruction   ? ?  ?  ? ?  ? ? ? PT Short Term Goals - 08/20/21 1001   ? ?  ? PT SHORT TERM GOAL #1  ? Title Patient will be adherent to HEP at least 3x a week to improve functional strength and balance for better safety at home.   ? Baseline 4/4: doing them 1-2x a week; 08/20/2021=Patient reports performing his low back stretching and no questions currently HEP.   ? Time 4   ? Period Weeks   ? Status Achieved   ? Target Date 08/21/21   ?  ? PT  SHORT TERM GOAL #2  ? Title Patient (< 84 years old) will complete five times sit to stand test in < 15 seconds indicating an increased LE strength and improved balance.   ? Baseline 4/4: 17.85 sec with arms across chest; 08/20/2021= 17.6 sec   ? Time 4   ? Period Weeks   ? Status On-going   ? Target Date 10/01/21   ? ?  ?  ? ?  ? ? ? ? PT Long Term Goals - 08/20/21 1001   ? ?  ? PT LONG TERM GOAL #1  ? Title Patient will increase six minute walk test distance to >1000 for progression to community ambulator and improve gait ability   ? Baseline 4/4: 1115 feet   ? Time 4   ? Period Weeks   ? Status Achieved   ? Target Date 08/21/21   ?  ? PT LONG TERM GOAL #2  ? Title Patient will ascend/descend 8 stairs without rail assist independently without loss of balance to improve ability to get in/out of home.   ? Baseline 4/4: requires 1 rail assist; 08/20/2021- Patient demonstrated steps without railing using reciprocal steps today.   ? Time 4   ? Period Weeks   ? Status Achieved   ? Target Date 08/21/21   ?  ? PT LONG TERM GOAL #3  ? Title Patient will increase BLE gross strength to 4+/5 as to improve functional strength for independent gait, increased standing tolerance and increased ADL ability.   ? Baseline 4/4: not formally assessed; 08/20/2021= 4+/5 with Right hip flex/knee ext/flex; Right hip abd= 4/5   ? Time 12   ? Period Weeks   ? Status Partially Met   ? Target Date 11/12/21   ?  ? PT LONG TERM GOAL #4  ? Title Patient will improve FOTO score to >50% to indicate improved  functional mobility with less pain with ADLs.   ? Baseline 4/4: 35%; 08/20/2021= 42%   ? Time 12   ? Period Weeks   ? Status On-going   ? Target Date 11/12/21   ?  ? PT LONG TERM GOAL #5  ? Title Patient will

## 2021-08-29 NOTE — Therapy (Signed)
[facility] ?[place], [place], [address] ?Phone: [phone]   Fax:  [phone] ? ?Speech Language Pathology Treatment ? ?Patient Details  ?Name: David Reeves ?MRN: [id number] ?Date of Birth: July 07, 1970 ?Referring Provider (SLP): [name] ? ? ?Encounter Date: 08/29/2021 ? ? End of Session - 08/29/21 2014   ? ? Visit Number 18   ? Number of Visits 42   ? Date for SLP Re-Evaluation 11/20/21   ? Authorization Type [employer] H[employer]   ? Authorization Time Period 08/27/2021 thru 11/20/2021   ? Authorization - Visit Number 8   ? Progress Note Due on Visit 10   ? SLP Start Time 0800   ? SLP Stop Time  0900   ? SLP Time Calculation (min) 60 min   ? Activity Tolerance Patient tolerated treatment well;No increased pain   ? ?  ?  ? ?  ? ? ?Past Medical History:  ?Diagnosis Date  ? Back pain 04/22/2012  ? Bone spur   ? Bulging disc 04/22/2012  ? Degenerative disc disease   ? Osteoarthritis   ? Panic anxiety syndrome   ? Stroke [facility]) 10/2020  ? Taking multiple medications for chronic disease   ? ? ?Past Surgical History:  ?Procedure Laterality Date  ? CYSTECTOMY    ? head  ? HERNIA REPAIR Left 2006,2014  ? [facility]  ? TOE SURGERY Right 2007  ? ? ?There were no vitals filed for this visit. ? ? Subjective Assessment - 08/29/21 2012   ? ? Subjective "I am surviving" pt reports being in immense pain   ? Currently in Pain? Yes   ? ?  ?  ? ?  ? ? ? ? ? ? ? ? ADULT SLP TREATMENT - 08/29/21 0001   ? ?  ? Treatment Provided  ? Treatment provided Cognitive-Linquistic   ?  ? Cognitive-Linquistic Treatment  ? Treatment focused on Cognition;Patient/family/caregiver education   ? Skilled Treatment Skilled treatment session focused on administering the Cognitive Linguistic Quick Test and the PROM NeuroQOL cognitive short form.  ? ? ? ?Cognitive Linguistic Quick Test: AGE - 74 - 11  ? ?The Cognitive Linguistic Quick Test (CLQT) was administered to assess the  relative status of five cognitive domains: attention, memory, language, executive functioning, and visuospatial skills. Scores from 10 tasks were used to estimate severity ratings (standardized for age groups 18-69 years and 70-89 years) for each domain, a clock drawing task, as well as an overall composite severity rating of cognition.    ? ?  ?Task Score Criterion Cut Scores  ?Personal Facts 8/8 8  ?Symbol Cancellation 12/12 11  ?Confrontation Naming 10/10 10  ?Clock Drawing  11/13 12  ?Story Retelling 4/10 6  ?Symbol Trails 5/10 9  ?Generative Naming 6/9 5  ?Design Memory 5/6 5  ?[place]  6/8 7  ?Design Generation 11/13 6  ? ? ?Cognitive Domain Composite Score Severity Rating  ?Attention 172/215 Mild  ?Memory 136/185 Moderate  ?Executive Function 24/40 WNL  ?Language 28/37 Mild  ?Visuospatial Skills 79/105 Mild  ?Clock Drawing  11/13 Mild  ?Composite Severity Rating  Mild  ? ? ?The Neuro-QOL? Item Bank v2.0-Cognition Function-Short Form is an eight-item test designed to measure difficulties with cognitive functioning (e.g., memory, attention and decision making or in the application of such abilities to everyday tasks (e.g., planning, organizing, calculating, remembering and learning). ? ?T-SCORE: 33.9 - 1.6 SD below mean of 50 ? ? ? ?    ? ?  ?  ? ?  ? ? ?  SLP Education - 08/29/21 2014   ? ? Education Details results of assessment and ST POC   ? Person(s) Educated Patient   ? Methods Explanation   ? Comprehension Need further instruction   ? ?  ?  ? ?  ? ? ? SLP Short Term Goals - 08/28/21 2052   ? ?  ? SLP SHORT TERM GOAL #1  ? Title Pt will use speech intelligibility strategies to slow rate of speech at the conversational level to achive ~ 50% speech intelligibility.   ? Baseline previous goal met, goal advanced to reflect progress   ? Time 10   ? Period --   sessions  ? Status Revised   ?  ? SLP SHORT TERM GOAL #2  ? Title Pt will produce fricatives in the initial, final and medial positions of words with 90%  accuracy.   ? Time 10   ? Period --   sessions  ? Status On-going   ?  ? SLP SHORT TERM GOAL #3  ? Title Pt will use speech intelligibility strategy of overarticulation to improve speech intelligibility to 75% at the simple sentence level.   ? Status Achieved   ?  ? SLP SHORT TERM GOAL #4  ? Title Pt will answer [place] questions after reading short story (5-6 sentences) with 50% accuracy given min to mod cues.   ? Baseline goal not met during reporting period, revised   ? Time 10   ? Period --   sessions  ? Status Revised   ?  ? SLP SHORT TERM GOAL #5  ? Title Pt will a) recall and b) demonstrate use of at least three external memory strategies.   ? Baseline new goal   ? Time 10   ? Period --   sessions  ? Status New   ? ?  ?  ? ?  ? ? ? SLP Long Term Goals - 08/28/21 2056   ? ?  ? SLP LONG TERM GOAL #1  ? Title Pt will use speech intelligibility strategies to achieve 50% speech intelligibility at the simple conversation level.   ? Baseline goal met, revised to reflect progress   ? Time 12   ? Period Weeks   ? Status Revised   ? Target Date 11/20/21   ?  ? SLP LONG TERM GOAL #2  ? Title Patient will demonstrate improved cognitive linguistic function for IND completion of ADLs tasks in home/community environments   ? Baseline new goal   ? Time 12   ? Period Weeks   ? Status New   ? Target Date 11/20/21   ? ?  ?  ? ?  ? ? ? Plan - 08/29/21 2015   ? ? Clinical Impression Statement Pt presents with mild cognitive impairments c/b mild deficits in attention, language, visuospatial skills and clock drawing with moderate deficits in memory.  Skilled ST intervention continues to be required to target these deficits in an effort to increase pt's functional independence, ability to drive and participate in community activities.   ? Speech Therapy Frequency 2x / week   ? Duration 12 weeks   ? Treatment/Interventions Language facilitation;Compensatory techniques;Cueing hierarchy;Internal/external aids;SLP instruction and  feedback;Patient/family education;Compensatory strategies;Functional tasks;Cognitive reorganization   ? Potential to Achieve Goals Good   ? Consulted and Agree with Plan of Care Patient   ? ?  ?  ? ?  ? ? ?Patient will benefit from skilled therapeutic intervention in order to improve the  following deficits and impairments:   ?Cognitive communication deficit ? ?H/O ischemic left MCA stroke ? ?H/O ischemic left ACA stroke ? ? ? ?Problem List ?Patient Active Problem List  ? Diagnosis Date Noted  ? Alterations of sensations following cerebrovascular accident 03/08/2021  ? Aphasia as late effect of cerebrovascular accident (CVA) 03/08/2021  ? Muscle spasm 02/22/2021  ? Acute non-recurrent frontal sinusitis 02/22/2021  ? Chronic pain syndrome 01/23/2021  ? Primary hypertension 01/23/2021  ? Nerve pain 01/23/2021  ? Erectile dysfunction due to diseases classified elsewhere 01/23/2021  ? Cognitive dysfunction 01/23/2021  ? Scrotal edema 01/23/2021  ? Elevated LDL cholesterol level 01/23/2021  ? History of stroke with residual deficit 01/23/2021  ? Primary insomnia 01/23/2021  ? Mouth pain 01/23/2021  ? Laceration of right hand without foreign body   ? Closed compression fracture of L1 vertebra ([place]) 05/24/2016  ? Lumbar stenosis with neurogenic claudication 11/01/2015  ? Chronic 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 11/08/2014  ? Hx of hemorrhoids 11/08/2014  ? AAA (abdominal aortic aneurysm) ([place]) 09/22/2014  ? Chronic pain associated with significant psychosocial dysfunction 09/22/2014  ? Panic disorder 09/22/2014  ? AB (asthmatic bronchitis) 08/17/2014  ? Anxiety 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 08/17/2014  ? Backache 08/17/2014  ? Lumbosacral spondylosis without myelopathy 08/17/2014  ? Disorder of male genital organs 08/17/2014  ? Brash 08/17/2014  ? Low back pain 08/17/2014  ? Tendon nodule 08/17/2014  ? Episodic paroxysmal anxiety disorder 08/17/2014  ? Hernia, inguinal, right 08/17/2014  ? Fast  heart beat 08/17/2014  ? Illness 08/17/2014  ? Inguinal hernia 10/14/2012  ? ?Donell Tomkins B. Rutherford Nail, M.S., CCC-SLP, CBIS ?Speech-Language Pathologist ?Rehabilitation Services ?Office [phone] ? ?[place], [place]

## 2021-08-29 NOTE — Telephone Encounter (Signed)

## 2021-08-29 NOTE — Therapy (Signed)
[facility] ?[place], [place], [address] ?Phone: [phone]   Fax:  [phone] ? ?Occupational Therapy Treatment ? ?Patient Details  ?Name: David Reeves ?MRN: [id number] ?Date of Birth: 1970-05-10 ?Referring Provider (OT): [name] ? ? ?Encounter Date: 08/29/2021 ? ? OT End of Session - 08/29/21 1332   ? ? Visit Number 26   ? Number of Visits 44   ? Date for OT Re-Evaluation 11/01/21   ? Authorization Type Progress reporting period starting 08/10/21   ? OT Start Time 0915   ? OT Stop Time 1000   ? OT Time Calculation (min) 45 min   ? Activity Tolerance Patient tolerated treatment well   ? Behavior During Therapy [facility] for tasks assessed/performed   ? ?  ?  ? ?  ? ? ?Past Medical History:  ?Diagnosis Date  ? Back pain 04/22/2012  ? Bone spur   ? Bulging disc 04/22/2012  ? Degenerative disc disease   ? Osteoarthritis   ? Panic anxiety syndrome   ? Stroke [facility]) 10/2020  ? Taking multiple medications for chronic disease   ? ? ?Past Surgical History:  ?Procedure Laterality Date  ? CYSTECTOMY    ? head  ? HERNIA REPAIR Left 2006,2014  ? [facility]  ? TOE SURGERY Right 2007  ? ? ?There were no vitals filed for this visit. ? ? Subjective Assessment - 08/29/21 1330   ? ? Subjective  "It's getting better." (RUE)   ? Patient is accompanied by: Family member   ? Pertinent History Pt. is a 51 y.o. who was diagnosed with a CVA on July 21st, 2022. Pt. completed several weeks of  inpatient rehab at [facility]. After returning to home, Pt. sustained a fall in december of 2022, and was admitted to the hospital with COVI-19, and back pain from the fall. Since the most recent discharge, Pt. has been residing with the ex-wife until the Pt. is ready to return to independent living. Pt. PMHx includes: Bilateral LBP with right sided sciatica, Lumbosacral spondylosis myelopathy, closed compression Fx of L1. Pt. enjoys cooking, and riding motorcycles.   ? Patient Stated  Goals Pt would like to be as independent as he was before, improve hand function.   ? Currently in Pain? Yes   ? Pain Score 3    ? Pain Location Back   ? Pain Orientation Lower   ? Pain Descriptors / Indicators Aching;Sore   ? Pain Type Chronic pain   ? Pain Radiating Towards low back   ? Pain Onset More than a month ago   ? Pain Frequency Constant   ? Aggravating Factors  bending/lifting/prolonged sitting   ? Pain Relieving Factors heat, walking   ? Effect of Pain on Daily Activities decreased activity tolerance   ? Multiple Pain Sites No   ? ?  ?  ? ?  ? ?Occupational Therapy Treatment: ?Neuro re-ed: ?Pt participated in digit isolation exercises for single digit taps and digit abd/add.  OT held other digits flat to table top during digit taps; pt completed 3 sets 10 reps each.  Pt worked to remove circular pegs from pegboard at table top level, working to lift and place them on a non-skid mat to beside the pegboard without dropping.  With extra time, rest, and multiple trials, pt was able to successfully grasp and lift pegs from board today with a 2point and sometimes a 3 point grasp pattern maintaining thumb position without  slipping.  Pt worked to isolate hand with cues to rest forearm on table top when working with pegs.  Pt also worked to grasp small palm sized balls from tray and place/release into hole of cone used for a target.  Pt required cues for thumb abd in prep for grasping the balls and was >75% accurate to place balls without dropping.  Pt required intermittent cues to minimize lateral leaning and shoulder hike during grasp/release activities with pegs and balls. ? ?Response to Treatment: ?Pt making steady gains with hand and digit isolation to grasp/release circular pegs and small palm sized balls.  Pt with noted ability to extend R small finger slightly from table top during digit taps, last week pt was only able to complete with physical assist from therapist.  Pt was able to successfully grasp  and lift pegs from board today with a 2 point and sometimes a 3 point grasp pattern maintaining thumb position without slipping.  After initial verbal and tactile cues, pt was often able to self correct to minimize compensatory leaning at trunk and minimize a shoulder hike or lifting elbow when working on hand function.  Pt was pleased with his progress today, which remains very motivating for pt.  Pt will continue to benefit from skilled OT for maximizing strength and coordination throughout RUE while working to isolate hand and digits to better engage dominant arm during daily tasks. ? ? OT Education - 08/29/21 1331   ? ? Education Details Isolating the 2nd digit and thumb, 2pt. and 3point grasp patterns.   ? Person(s) Educated Patient   ? Methods Explanation;Demonstration;Tactile cues;Verbal cues   ? Comprehension Verbalized understanding;Returned demonstration;Verbal cues required;Tactile cues required;Need further instruction   ? ?  ?  ? ?  ? ? ? OT Short Term Goals - 08/10/21 1330   ? ?  ? OT SHORT TERM GOAL #1  ? Title Pt. wil demonstrtae independence with HEPs   ? Baseline Eval: Pt. currently does not have one, 10th visit:  changes in HEP ongoing; 20th: instructed in putty exercises, changes ongoing   ? Time 6   ? Period Weeks   ? Status On-going   ? Target Date 09/20/21   ? ?  ?  ? ?  ? ? ? ? OT Long Term Goals - 08/10/21 1331   ? ?  ? OT LONG TERM GOAL #1  ? Title Pt. will improve RUE strength by 2 mm grades to assist with ADLs, and IADLs,   ? Baseline Eval: Right shoulder flexion, abduction 3/5, elbow flexion, extension wrist extension 3+/5, 10th visit: improving with RUE strength by not yet met goal; 20th visit: R shd flex/abd 4-, elbow flex/ext 4+, wrist flex 4-, wrist ext 4+   ? Time 12   ? Period Weeks   ? Status On-going   ? Target Date 11/01/21   ?  ? OT LONG TERM GOAL #2  ? Title Pt. will improve right grip by 10 lbs to prepare for firmly holding objects for IADLs.   ? Baseline Eval: R: 38#, L:  124# (In 3rd dynamometer slot); R 35# in 4th slot, 28# in 3rd slot   ? Time 12   ? Period Weeks   ? Status On-going   ? Target Date 11/01/21   ?  ? OT LONG TERM GOAL #3  ? Title Pt. will improve right lateral pinch strength by 5# to assist with cutting food   ? Baseline Eval: R: 17, L: 25;  20th: R: 18   ? Time 12   ? Period Weeks   ? Status On-going   ? Target Date 11/01/21   ?  ? OT LONG TERM GOAL #4  ? Title Pt. will improve Right 3pt pinch by 2# to be able to hold/open items for cooking   ? Baseline Eval: R: Pt. unable to engage thumb L: 29#; 20th: R unable   ? Time 12   ? Period Weeks   ? Status On-going   ? Target Date 11/01/21   ?  ? OT LONG TERM GOAL #5  ? Title Pt. will improve right hand [facility] skills to be able to independently manipulate buttons, and zippers.   ? Baseline Eval: Pt. has difficulty managing buttons,a nd zippers. 10th visit:  improving; 20th: unable to engage R hand effectively, performs 1 handed with L   ? Time 12   ? Period Weeks   ? Status On-going   ? Target Date 11/01/21   ?  ? OT LONG TERM GOAL #6  ? Title Pt. will independently write his name   ? Baseline Eval: Pt. is unable to hold a pen; 20th: pt can grip PenAgain with R hand and can make marks on paper with difficulty, but not yet writing name   ? Time 12   ? Period Weeks   ? Status On-going   ? Target Date 11/01/21   ?  ? OT LONG TERM GOAL #7  ? Title Pt. will improve FOTO score by 2 points to reflect funational improvement   ? Baseline Eval: FOTO score 46 with TR score 52; 20th: 45   ? Time 12   ? Period Weeks   ? Status On-going   ? Target Date 11/01/21   ? ?  ?  ? ?  ? ? ? Plan - 08/29/21 1347   ? ? Clinical Impression Statement Pt making steady gains with hand and digit isolation to grasp/release circular pegs and small palm sized balls.  Pt with noted ability to extend R small finger slightly from table top during digit taps, last week pt was only able to complete with physical assist from therapist.  Pt was able to successfully  grasp and lift pegs from board today with a 2 point and sometimes a 3 point grasp pattern maintaining thumb position without slipping.  Pt required intermittent WB through R hand during rest breaks from gr

## 2021-08-29 NOTE — Patient Instructions (Signed)
Access Code: [email or web address] ?URL: [email or web address] ?Date: 08/29/2021 ?Prepared by: Zettie Pho ? ?Exercises ?- Sidelying Hip Abduction  - 1 x daily - 7 x weekly - 2 sets - 10 reps ?- Sidelying Hip Circles  - 1 x daily - 7 x weekly - 2 sets - 5 reps ?

## 2021-08-31 ENCOUNTER — Other Ambulatory Visit: Payer: Self-pay | Admitting: Physician Assistant

## 2021-08-31 DIAGNOSIS — M47817 Spondylosis without myelopathy or radiculopathy, lumbosacral region: Secondary | ICD-10-CM

## 2021-08-31 DIAGNOSIS — G8929 Other chronic pain: Secondary | ICD-10-CM

## 2021-08-31 NOTE — Telephone Encounter (Signed)
Medication Refill - Medication: Diazepam 5mg   ? ?Has the patient contacted their pharmacy? No. ? ?[id number] [place], [place] - [address] Phone:  [phone]  ?Fax:  [phone]  ?  ? ? ?Agent: Please be advised that RX refills may take up to 3 business days. We ask that you follow-up with your pharmacy.  ?

## 2021-09-03 ENCOUNTER — Encounter: Payer: Self-pay | Admitting: Physical Therapy

## 2021-09-03 ENCOUNTER — Ambulatory Visit: Payer: Medicare HMO | Admitting: Physical Therapy

## 2021-09-03 ENCOUNTER — Ambulatory Visit: Payer: Medicare HMO | Admitting: Speech Pathology

## 2021-09-03 ENCOUNTER — Ambulatory Visit: Payer: Medicare HMO

## 2021-09-03 DIAGNOSIS — R2681 Unsteadiness on feet: Secondary | ICD-10-CM | POA: Diagnosis not present

## 2021-09-03 DIAGNOSIS — M545 Low back pain, unspecified: Secondary | ICD-10-CM | POA: Diagnosis not present

## 2021-09-03 DIAGNOSIS — R41841 Cognitive communication deficit: Secondary | ICD-10-CM | POA: Diagnosis not present

## 2021-09-03 DIAGNOSIS — R278 Other lack of coordination: Secondary | ICD-10-CM

## 2021-09-03 DIAGNOSIS — M6281 Muscle weakness (generalized): Secondary | ICD-10-CM

## 2021-09-03 DIAGNOSIS — R482 Apraxia: Secondary | ICD-10-CM

## 2021-09-03 DIAGNOSIS — R269 Unspecified abnormalities of gait and mobility: Secondary | ICD-10-CM

## 2021-09-03 DIAGNOSIS — R262 Difficulty in walking, not elsewhere classified: Secondary | ICD-10-CM

## 2021-09-03 DIAGNOSIS — F09 Unspecified mental disorder due to known physiological condition: Secondary | ICD-10-CM | POA: Diagnosis not present

## 2021-09-03 DIAGNOSIS — Z8673 Personal history of transient ischemic attack (TIA), and cerebral infarction without residual deficits: Secondary | ICD-10-CM

## 2021-09-03 NOTE — Telephone Encounter (Signed)
Requested medication (s) are due for refill today: yes ? ?Requested medication (s) are on the active medication list: yes ? ?Last refill:  08/12/21 #60/0 ? ?Future visit scheduled: yes ? ?Notes to clinic: Unable to refill per protocol, cannot delegate. ? ? ?  ?Requested Prescriptions  ?Pending Prescriptions Disp Refills  ? diazepam (VALIUM) 5 MG tablet 60 tablet 0  ?  Sig: TAKE 1 TABLET (5 MG TOTAL) BY MOUTH EVERY 12 HOURS AS NEEDED FOR MUSCLE SPASM  ?  ? Not Delegated - Psychiatry: Anxiolytics/Hypnotics 2 Failed - 09/03/2021 11:09 AM  ?  ?  Failed - This refill cannot be delegated  ?  ?  Failed - Urine Drug Screen completed in last 360 days  ?  ?  Passed - Patient is not pregnant  ?  ?  Passed - Valid encounter within last 6 months  ?  Recent Outpatient Visits   ? ?      ? 3 weeks ago History of stroke with residual deficit  ? [facility], Lillia Abed, PA-C  ? 4 months ago Alterations of sensations following cerebrovascular accident  ? [facility], Lillia Abed, PA-C  ? 6 months ago Alterations of sensations following cerebrovascular accident  ? [facility], Lillia Abed, PA-C  ? 6 months ago Muscle spasm  ? [facility] Jacky Kindle, FNP  ? 7 months ago Nerve pain  ? [facility] T, FNP  ? ?  ?  ?Future Appointments   ? ?        ? In 3 months Drubel, Lou [facility], [facility]  ? ?  ? ? ?  ?  ?  ? ?

## 2021-09-03 NOTE — Patient Instructions (Signed)
Bring device to next session ?

## 2021-09-03 NOTE — Therapy (Signed)
[place] ?[facility] ?[place], [place], [address] ?Phone: [phone]   Fax:  [phone] ? ?Occupational Therapy Treatment ? ?Patient Details  ?Name: David Reeves ?MRN: [id number] ?Date of Birth: 07/07/1970 ?Referring Provider (OT): [name] ? ? ?Encounter Date: 09/03/2021 ? ? OT End of Session - 09/03/21 0942   ? ? Visit Number 27   ? Number of Visits 44   ? Date for OT Re-Evaluation 11/01/21   ? Authorization Type Progress reporting period starting 08/10/21   ? OT Start Time 0915   ? OT Stop Time 1000   ? OT Time Calculation (min) 45 min   ? Activity Tolerance Patient tolerated treatment well   ? Behavior During Therapy [facility] for tasks assessed/performed   ? ?  ?  ? ?  ? ? ?Past Medical History:  ?Diagnosis Date  ? Back pain 04/22/2012  ? Bone spur   ? Bulging disc 04/22/2012  ? Degenerative disc disease   ? Osteoarthritis   ? Panic anxiety syndrome   ? Stroke [facility]) 10/2020  ? Taking multiple medications for chronic disease   ? ? ?Past Surgical History:  ?Procedure Laterality Date  ? CYSTECTOMY    ? head  ? HERNIA REPAIR Left 2006,2014  ? [facility]  ? TOE SURGERY Right 2007  ? ? ?There were no vitals filed for this visit. ? ? Subjective Assessment - 09/03/21 0939   ? ? Subjective  "This is teaching me patience."   ? Patient is accompanied by: Family member   ? Pertinent History Pt. is a 51 y.o. who was diagnosed with a CVA on July 21st, 2022. Pt. completed several weeks of  inpatient rehab at [facility]. After returning to home, Pt. sustained a fall in december of 2022, and was admitted to the hospital with COVI-19, and back pain from the fall. Since the most recent discharge, Pt. has been residing with the ex-wife until the Pt. is ready to return to independent living. Pt. PMHx includes: Bilateral LBP with right sided sciatica, Lumbosacral spondylosis myelopathy, closed compression Fx of L1. Pt. enjoys cooking, and riding motorcycles.   ? Patient Stated  Goals Pt would like to be as independent as he was before, improve hand function.   ? Currently in Pain? Yes   ? Pain Score 3    ? Pain Location Back   ? Pain Orientation Lower   ? Pain Descriptors / Indicators Aching;Sore   ? Pain Type Chronic pain   ? Pain Radiating Towards low back   ? Pain Onset More than a month ago   ? Pain Frequency Constant   ? Aggravating Factors  bending/lifting/prolonged sitting   ? Pain Relieving Factors heat, walking   ? Effect of Pain on Daily Activities decrased activity tolerance   ? Multiple Pain Sites No   ? ?  ?  ? ?  ? ?Occupational Therapy Treatment: ?Neuro re-ed: ?Pt participated in hand taps on table to facilitate active wrist flex/ext working to match a slow tempo.  Performed digit isolation exercises for single digit taps and digit abd/add, requiring cues for technique and slow speed, as well as min A and vc to minimize digit flexion during add.  OT held other digits flat to table top during digit taps; pt completed 3 sets 10 reps each.  Pt worked with Teacher, adult education blocks at table top level, working to lift and place them into a bucket at table top level.  With extra time, rest, and multiple trials, pt was able to successfully grasp and lift blocks from table with a 2point and sometimes a 3 point grasp pattern maintaining thumb position without slipping 80% of time.  OT provided set up of block between thumb and digits for 25% of trials d/t decreased active thumb abd and ext and general hand fatigue. ? ?Response to Treatment: ?Pt continues to develop R hand coordination, and is improving with digit isolation exercises.  Pt is working to [employer] a slow tempo during digit taps and hand taps on table to target improved motor planning.  Pt required frequent rest breaks today d/t hand fatigue, but recovers well with rest breaks and intermittent WB and passive wrist and digit extension stretching between repetitions of hand exercises.  Pt will continue to benefit from skilled OT  for maximizing strength and coordination throughout RUE while working to isolate hand and digits to better engage dominant arm during daily tasks. ? ? ? OT Education - 09/03/21 0942   ? ? Education Details Isolating the 2nd digit and thumb, 2pt. and 3point grasp patterns.   ? Person(s) Educated Patient   ? Methods Explanation;Demonstration;Tactile cues;Verbal cues   ? Comprehension Verbalized understanding;Returned demonstration;Verbal cues required;Tactile cues required;Need further instruction   ? ?  ?  ? ?  ? ? ? OT Short Term Goals - 08/10/21 1330   ? ?  ? OT SHORT TERM GOAL #1  ? Title Pt. wil demonstrtae independence with HEPs   ? Baseline Eval: Pt. currently does not have one, 10th visit:  changes in HEP ongoing; 20th: instructed in putty exercises, changes ongoing   ? Time 6   ? Period Weeks   ? Status On-going   ? Target Date 09/20/21   ? ?  ?  ? ?  ? ? ? ? OT Long Term Goals - 08/10/21 1331   ? ?  ? OT LONG TERM GOAL #1  ? Title Pt. will improve RUE strength by 2 mm grades to assist with ADLs, and IADLs,   ? Baseline Eval: Right shoulder flexion, abduction 3/5, elbow flexion, extension wrist extension 3+/5, 10th visit: improving with RUE strength by not yet met goal; 20th visit: R shd flex/abd 4-, elbow flex/ext 4+, wrist flex 4-, wrist ext 4+   ? Time 12   ? Period Weeks   ? Status On-going   ? Target Date 11/01/21   ?  ? OT LONG TERM GOAL #2  ? Title Pt. will improve right grip by 10 lbs to prepare for firmly holding objects for IADLs.   ? Baseline Eval: R: 38#, L: 124# (In 3rd dynamometer slot); R 35# in 4th slot, 28# in 3rd slot   ? Time 12   ? Period Weeks   ? Status On-going   ? Target Date 11/01/21   ?  ? OT LONG TERM GOAL #3  ? Title Pt. will improve right lateral pinch strength by 5# to assist with cutting food   ? Baseline Eval: R: 17, L: 25; 20th: R: 18   ? Time 12   ? Period Weeks   ? Status On-going   ? Target Date 11/01/21   ?  ? OT LONG TERM GOAL #4  ? Title Pt. will improve Right 3pt pinch  by 2# to be able to hold/open items for cooking   ? Baseline Eval: R: Pt. unable to engage thumb L: 29#; 20th: R unable   ? Time 12   ?  Period Weeks   ? Status On-going   ? Target Date 11/01/21   ?  ? OT LONG TERM GOAL #5  ? Title Pt. will improve right hand [facility] skills to be able to independently manipulate buttons, and zippers.   ? Baseline Eval: Pt. has difficulty managing buttons,a nd zippers. 10th visit:  improving; 20th: unable to engage R hand effectively, performs 1 handed with L   ? Time 12   ? Period Weeks   ? Status On-going   ? Target Date 11/01/21   ?  ? OT LONG TERM GOAL #6  ? Title Pt. will independently write his name   ? Baseline Eval: Pt. is unable to hold a pen; 20th: pt can grip Pen[employer] with R hand and can make marks on paper with difficulty, but not yet writing name   ? Time 12   ? Period Weeks   ? Status On-going   ? Target Date 11/01/21   ?  ? OT LONG TERM GOAL #7  ? Title Pt. will improve FOTO score by 2 points to reflect funational improvement   ? Baseline Eval: FOTO score 46 with TR score 52; 20th: 45   ? Time 12   ? Period Weeks   ? Status On-going   ? Target Date 11/01/21   ? ?  ?  ? ?  ? ? ? Plan - 09/03/21 1202   ? ? Clinical Impression Statement Pt continues to develop R hand coordination, and is improving with digit isolation exercises.  Pt is working to [employer] a slow tempo during digit taps and hand taps on table to target improved motor planning.  Pt required frequent rest breaks today d/t hand fatigue, but recovers well with rest breaks and intermittent WB and passive wrist and digit extension stretching between repetitions of hand exercises.  Pt will continue to benefit from skilled OT for maximizing strength and coordination throughout RUE while working to isolate hand and digits to better engage dominant arm during daily tasks.   ? OT Occupational Profile and History Detailed Assessment- Review of Records and additional review of physical, cognitive, psychosocial history related  to current functional performance   ? Occupational performance deficits (Please refer to evaluation for details): ADL's;IADL's;Education   ? Rehab Potential Good   ? Clinical Decision Making Several t

## 2021-09-03 NOTE — Therapy (Signed)
[place] ?[facility] ?[place], [place], [address] ?Phone: [phone]   Fax:  [phone] ? ?Speech Language Pathology Treatment ? ?Patient Details  ?Name: David Reeves ?MRN: [id number] ?Date of Birth: 1970-12-16 ?Referring Provider (SLP): [name] ? ? ?Encounter Date: 09/03/2021 ? ? End of Session - 09/03/21 1550   ? ? Visit Number 19   ? Number of Visits 42   ? Date for SLP Re-Evaluation 11/20/21   ? Authorization Type [employer] Medicare HMO   ? Authorization Time Period 08/27/2021 thru 11/20/2021   ? Authorization - Visit Number 9   ? Progress Note Due on Visit 10   ? SLP Start Time 0800   ? SLP Stop Time  0900   ? SLP Time Calculation (min) 60 min   ? Activity Tolerance Patient tolerated treatment well   ? ?  ?  ? ?  ? ? ?Past Medical History:  ?Diagnosis Date  ? Back pain 04/22/2012  ? Bone spur   ? Bulging disc 04/22/2012  ? Degenerative disc disease   ? Osteoarthritis   ? Panic anxiety syndrome   ? Stroke [facility]) 10/2020  ? Taking multiple medications for chronic disease   ? ? ?Past Surgical History:  ?Procedure Laterality Date  ? CYSTECTOMY    ? head  ? HERNIA REPAIR Left 2006,2014  ? [facility]  ? TOE SURGERY Right 2007  ? ? ?There were no vitals filed for this visit. ? ? Subjective Assessment - 09/03/21 1549   ? ? Subjective "I have one of those tablets from [place] at home"   ? Currently in Pain? No/denies   ? ?  ?  ? ?  ? ? ? ? ? ? ? ? ADULT SLP TREATMENT - 09/03/21 0001   ? ?  ? Treatment Provided  ? Treatment provided Cognitive-Linquistic   ?  ? Cognitive-Linquistic Treatment  ? Treatment focused on Cognition;Patient/family/caregiver education   ? Skilled Treatment Skilled treatment session target pt's cognitive communication goals.    ? ?SLP facilitated session by utilizing the [place] to target cognitive function:     ? ?Cognition: ADLs -    ?[employer] - Level 2- 90%   ?Reading calendars and Schedules - Level 1  - 70% improving to 100% with moderate verbal assistance  [place] - Level 1 - 60% improving to 100% with moderate verbal assistance   ? ?  ?  ? ?  ? ? ? SLP Education - 09/03/21 1549   ? ? Education Details TalkPath Therapy app, Lingraphica Device   ? Person(s) Educated Patient   ? Methods Explanation;Demonstration;Verbal cues   ? Comprehension Verbalized understanding;Need further instruction;Returned demonstration   ? ?  ?  ? ?  ? ? ? SLP Short Term Goals - 08/28/21 2052   ? ?  ? SLP SHORT TERM GOAL #1  ? Title Pt will use speech intelligibility strategies to slow rate of speech at the conversational level to achive ~ 50% speech intelligibility.   ? Baseline previous goal met, goal advanced to reflect progress   ? Time 10   ? Period --   sessions  ? Status Revised   ?  ? SLP SHORT TERM GOAL #2  ? Title Pt will produce fricatives in the initial, final and medial positions of words with 90% accuracy.   ? Time 10   ? Period --   sessions  ? Status On-going   ?  ?  SLP SHORT TERM GOAL #3  ? Title Pt will use speech intelligibility strategy of overarticulation to improve speech intelligibility to 75% at the simple sentence level.   ? Status Achieved   ?  ? SLP SHORT TERM GOAL #4  ? Title Pt will answer [place] questions after reading short story (5-6 sentences) with 50% accuracy given min to mod cues.   ? Baseline goal not met during reporting period, revised   ? Time 10   ? Period --   sessions  ? Status Revised   ?  ? SLP SHORT TERM GOAL #5  ? Title Pt will a) recall and b) demonstrate use of at least three external memory strategies.   ? Baseline new goal   ? Time 10   ? Period --   sessions  ? Status New   ? ?  ?  ? ?  ? ? ? SLP Long Term Goals - 08/28/21 2056   ? ?  ? SLP LONG TERM GOAL #1  ? Title Pt will use speech intelligibility strategies to achieve 50% speech intelligibility at the simple conversation level.   ? Baseline goal met, revised to reflect progress   ? Time 12   ? Period Weeks   ? Status Revised   ?  Target Date 11/20/21   ?  ? SLP LONG TERM GOAL #2  ? Title Patient will demonstrate improved cognitive linguistic function for IND completion of ADLs tasks in home/community environments   ? Baseline new goal   ? Time 12   ? Period Weeks   ? Status New   ? Target Date 11/20/21   ? ?  ?  ? ?  ? ? ? Plan - 09/03/21 1551   ? ? Clinical Impression Statement Pt presents with moderate difficulty performing cognitive tasks related to basic to semi-complex problem solving. Skilled ST intervention continues to be required to target his moderate cognitive impairments to improve quality of life, functional independence and reduce caregiver burden. Plan made for pt to bring in SGD so that cognitive apps can be loaded for additional practice.   ? Speech Therapy Frequency 2x / week   ? Duration 12 weeks   ? Treatment/Interventions Language facilitation;Compensatory techniques;Cueing hierarchy;Internal/external aids;SLP instruction and feedback;Patient/family education;Compensatory strategies;Functional tasks;Cognitive reorganization   ? SLP Home Exercise Plan provided, see pt instructions section   ? Consulted and Agree with Plan of Care Patient   ? ?  ?  ? ?  ? ? ?Patient will benefit from skilled therapeutic intervention in order to improve the following deficits and impairments:   ?Cognitive communication deficit ? ?H/O ischemic left MCA stroke ? ?H/O ischemic left ACA stroke ? ? ? ?Problem List ?Patient Active Problem List  ? Diagnosis Date Noted  ? Alterations of sensations following cerebrovascular accident 03/08/2021  ? Aphasia as late effect of cerebrovascular accident (CVA) 03/08/2021  ? Muscle spasm 02/22/2021  ? Acute non-recurrent frontal sinusitis 02/22/2021  ? Chronic pain syndrome 01/23/2021  ? Primary hypertension 01/23/2021  ? Nerve pain 01/23/2021  ? Erectile dysfunction due to diseases classified elsewhere 01/23/2021  ? Cognitive dysfunction 01/23/2021  ? Scrotal edema 01/23/2021  ? Elevated LDL cholesterol  level 01/23/2021  ? History of stroke with residual deficit 01/23/2021  ? Primary insomnia 01/23/2021  ? Mouth pain 01/23/2021  ? Laceration of right hand without foreign body   ? Closed compression fracture of L1 vertebra ([place]) 05/24/2016  ? Lumbar stenosis with neurogenic claudication 11/01/2015  ?  Chronic 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 11/08/2014  ? Hx of hemorrhoids 11/08/2014  ? AAA (abdominal aortic aneurysm) ([place]) 09/22/2014  ? Chronic pain associated with significant psychosocial dysfunction 09/22/2014  ? Panic disorder 09/22/2014  ? AB (asthmatic bronchitis) 08/17/2014  ? Anxiety 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 08/17/2014  ? Backache 08/17/2014  ? Lumbosacral spondylosis without myelopathy 08/17/2014  ? Disorder of male genital organs 08/17/2014  ? Brash 08/17/2014  ? Low back pain 08/17/2014  ? Tendon nodule 08/17/2014  ? Episodic paroxysmal anxiety disorder 08/17/2014  ? Hernia, inguinal, right 08/17/2014  ? Fast heart beat 08/17/2014  ? Illness 08/17/2014  ? Inguinal hernia 10/14/2012  ? ?Brach Birdsall B. Rutherford Nail, M.S., CCC-SLP, CBIS ?Speech-Language Pathologist ?Rehabilitation Services ?Office [phone] ? ?[place], [place] ?09/03/2021, 3:54 PM ? ?[place] ?[facility] ?[place], [place], [address] ?Phone: [phone]   Fax:  [phone] ? ? ?Name: David Reeves ?MRN: [id number] ?Date of Birth: 04/15/1971 ? ?

## 2021-09-03 NOTE — Therapy (Signed)
[place] ?[facility] ?[address] ?[place], [place], [address] ?Phone: [phone]   Fax:  [phone] ? ?Physical Therapy Treatment ?Physical Therapy Progress Note ? ? ?Dates of reporting period  07/24/21   to   09/03/21 ? ? ?Patient Details  ?Name: David Reeves ?MRN: [id number] ?Date of Birth: 03-23-1971 ?Referring Provider (PT): Alfredia Ferguson PA ? ? ?Encounter Date: 09/03/2021 ? ? PT End of Session - 09/03/21 0957   ? ? Visit Number 20   ? Number of Visits 41   ? Date for PT Re-Evaluation 11/12/21   ? Authorization Type [employer] Medicare- authorized 16 visits 4/4-6/1   ? Authorization - Visit Number 5   ? Authorization - Number of Visits 16   ? Progress Note Due on Visit 20   ? PT Start Time 1002   ? PT Stop Time 1040   ? PT Time Calculation (min) 38 min   ? Activity Tolerance Patient tolerated treatment well;No increased pain   ? Behavior During Therapy [facility] for tasks assessed/performed   ? ?  ?  ? ?  ? ? ?Past Medical History:  ?Diagnosis Date  ? Back pain 04/22/2012  ? Bone spur   ? Bulging disc 04/22/2012  ? Degenerative disc disease   ? Osteoarthritis   ? Panic anxiety syndrome   ? Stroke [facility]) 10/2020  ? Taking multiple medications for chronic disease   ? ? ?Past Surgical History:  ?Procedure Laterality Date  ? CYSTECTOMY    ? head  ? HERNIA REPAIR Left 2006,2014  ? [facility]  ? TOE SURGERY Right 2007  ? ? ?There were no vitals filed for this visit. ? ? Subjective Assessment - 09/03/21 1001   ? ? Subjective Pt reports feeling better than normal. he reports continued pain in low back but it isn't as bad as other days; He reports resting most of the weekend with increased pain over the weekend;   ? Pertinent History Pt. is a 51 y.o. who was diagnosed with a CVA on July 21st, 2022. Pt. completed several weeks of  inpatient rehab at [facility]. After returning to home. He was discharged in late August/early September and recieved home health PT. Pt. sustained a fall in  december of 2022, and was admitted to the hospital with COVID-19, and Chronic back pain. He reports chronic back pain syndrome since 2012. Since the most recent discharge, Pt. has been residing with the ex-wife until the Pt. is ready to return to independent living. He did recieve 2 weeks of home health after discharge in December 2022. He is now being referred to outpatient PT to address weakness from stroke and improve fine motor movement. He reports rarely getting numbness/tingling in RLE. PMHx includes: Bilateral LBP with right sided sciatica, Lumbosacral spondylosis myelopathy, closed compression Fx of L1. Has chronic insomnia. osteoarthritis, Panic anxiety syndrome (taking medication) Pt. enjoys cooking, and riding motorcycles. Patient is going to [facility] spine center for Epidural spinal injections on 06/07/21;   ? Limitations Sitting;Walking   ? How long can you sit comfortably? 20 min;   ? How long can you stand comfortably? 5-10 min;   ? How long can you walk comfortably? able to walk long ways (>500 feet)   ? Diagnostic tests MRI Nov 2022- Stable chronic burst L1 fracture, progressive disc degeneration with disc bulging  eccentric to the left at L3-4 and L4-5. spinal stenosis at L5-S1;   ? Patient Stated Goals "I Need all the  help  I can get. I want to move around better and reduce my back pain."   ? Currently in Pain? Yes   ? Pain Score 3    ? Pain Location Back   ? Pain Orientation Lower   ? Pain Descriptors / Indicators Aching;Sore   ? Pain Type Chronic pain   ? Pain Onset More than a month ago   ? Pain Frequency Constant   ? Aggravating Factors  bending/lifting/prolonged sitting   ? Pain Relieving Factors heat/walking   ? Effect of Pain on Daily Activities decreased activity tolerance;   ? Multiple Pain Sites No   ? ?  ?  ? ?  ? ? ? ? ? ?   ?Instructed patient in LE strengthening exercise: ?Standing on bottom step, lifting LE up to 3rd step against green tband for hip flexor strengthening 2x10 reps   ? ?Standing hip abduction green tband x10 reps each LE; ?Side stepping x6 feet x5 laps each direction ?Patient required cues to keep foot oriented forward for less hip ER to isolate abductor strengthening;  ?Required intermittent rail assist;  ? ?Single leg heel raises x10 reps each LE, does report moderate difficulty with RLE but is able to exhibit better ROM compared to previous sessions; ? ?Instructed patient in balance exercise: ?Standing beside support bar: ?SLS on firm surface with intermittent rail assist 30 sec hold x2 reps each LE; ? ?Forward/backward step airex to airex x10 reps with increased apraxia/difficulty stepping backward with RLE but was able to exhibit better accuracy with increased repetition;  ?Instructed patient standing in staggered stance (one foot on each airex pad) ?Unsupported standing 30 sec hold - more difficulty with LLE ahead of RLE ?            Progressed to head turns side/side x5 reps ?            Progressed to trunk rotation side/side x5 reps  ?Required CGA to min A with decreased ankle strategies noted; ? ? ?Patient lying in supine with moist heat to low back.  ? Legs apart: Windshield wiper trunk rotation stretch x5 reps each x2 sets; ?Hamstring stretch (BLE) x 30 sec x1 each leg.  ?            Progressed to hamstring neural stretch with ankle DF/PF x10 reps each LE, moderate tightness noted on RLE;  ?Knee to chest (BLE) x20 sec x1 each leg ?Manual hip circles (CW/CCW) x 5 reps each direction and each LE ?LE crossed over contralateral LE for IT band stretch x5 sec hold x3 reps each LE;  ?  ?  ?Patient tolerated stretches well reporting a reduction in back stiffness. He is still having pain but reports less stiffness;  ?  ?  ?Patient tolerated session well. He denies any increase in back pain but reports his pain has not decreased. He reports overall exercises are getting easier as he is getting stronger. ?  ? Goals were recently updated on 08/20/21 ? ? Patient's condition has the  potential to improve in response to therapy. Maximum improvement is yet to be obtained. The anticipated improvement is attainable and reasonable in a generally predictable time.  Patient reports working on exercises, but has a hard time with a lot of standing/walking and spends most of his time laying down due to back pain;  ? ? ? ? ? ? ? ? ? ? ? ? ? ? ? ? ? ? ? ? ? ? ? PT  Education - 09/03/21 0957   ? ? Education Details LE strengthening/ROM   ? Person(s) Educated Patient   ? Methods Explanation;Verbal cues   ? Comprehension Verbalized understanding;Returned demonstration;Verbal cues required;Need further instruction   ? ?  ?  ? ?  ? ? ? PT Short Term Goals - 08/20/21 1001   ? ?  ? PT SHORT TERM GOAL #1  ? Title Patient will be adherent to HEP at least 3x a week to improve functional strength and balance for better safety at home.   ? Baseline 4/4: doing them 1-2x a week; 08/20/2021=Patient reports performing his low back stretching and no questions currently HEP.   ? Time 4   ? Period Weeks   ? Status Achieved   ? Target Date 08/21/21   ?  ? PT SHORT TERM GOAL #2  ? Title Patient (< 18 years old) will complete five times sit to stand test in < 15 seconds indicating an increased LE strength and improved balance.   ? Baseline 4/4: 17.85 sec with arms across chest; 08/20/2021= 17.6 sec   ? Time 4   ? Period Weeks   ? Status On-going   ? Target Date 10/01/21   ? ?  ?  ? ?  ? ? ? ? PT Long Term Goals - 08/20/21 1001   ? ?  ? PT LONG TERM GOAL #1  ? Title Patient will increase six minute walk test distance to >1000 for progression to community ambulator and improve gait ability   ? Baseline 4/4: 1115 feet   ? Time 4   ? Period Weeks   ? Status Achieved   ? Target Date 08/21/21   ?  ? PT LONG TERM GOAL #2  ? Title Patient will ascend/descend 8 stairs without rail assist independently without loss of balance to improve ability to get in/out of home.   ? Baseline 4/4: requires 1 rail assist; 08/20/2021- Patient demonstrated  steps without railing using reciprocal steps today.   ? Time 4   ? Period Weeks   ? Status Achieved   ? Target Date 08/21/21   ?  ? PT LONG TERM GOAL #3  ? Title Patient will increase BLE gross strength to 4+/5 as

## 2021-09-04 MED ORDER — DIAZEPAM 5 MG PO TABS: ORAL_TABLET | ORAL | 0 refills | Status: DC

## 2021-09-05 ENCOUNTER — Ambulatory Visit: Payer: Medicare HMO | Admitting: Physical Therapy

## 2021-09-05 ENCOUNTER — Encounter: Payer: Self-pay | Admitting: Physical Therapy

## 2021-09-05 ENCOUNTER — Ambulatory Visit: Payer: Medicare HMO

## 2021-09-05 ENCOUNTER — Ambulatory Visit: Payer: Medicare HMO | Admitting: Speech Pathology

## 2021-09-05 DIAGNOSIS — M6281 Muscle weakness (generalized): Secondary | ICD-10-CM

## 2021-09-05 DIAGNOSIS — R278 Other lack of coordination: Secondary | ICD-10-CM

## 2021-09-05 DIAGNOSIS — Z8673 Personal history of transient ischemic attack (TIA), and cerebral infarction without residual deficits: Secondary | ICD-10-CM

## 2021-09-05 DIAGNOSIS — F09 Unspecified mental disorder due to known physiological condition: Secondary | ICD-10-CM

## 2021-09-05 DIAGNOSIS — M545 Low back pain, unspecified: Secondary | ICD-10-CM

## 2021-09-05 DIAGNOSIS — R41841 Cognitive communication deficit: Secondary | ICD-10-CM | POA: Diagnosis not present

## 2021-09-05 DIAGNOSIS — R482 Apraxia: Secondary | ICD-10-CM | POA: Diagnosis not present

## 2021-09-05 DIAGNOSIS — R262 Difficulty in walking, not elsewhere classified: Secondary | ICD-10-CM

## 2021-09-05 DIAGNOSIS — R2681 Unsteadiness on feet: Secondary | ICD-10-CM

## 2021-09-05 DIAGNOSIS — R269 Unspecified abnormalities of gait and mobility: Secondary | ICD-10-CM

## 2021-09-05 NOTE — Therapy (Signed)
[place] MAIN [facility] SERVICES [address] [place], [place], [address] Phone: [phone]   Fax:  [phone]  Physical Therapy Treatment  Patient Details  Name: David Reeves MRN: [id number] Date of Birth: Mar 16, 1971 Referring Provider (PT): [name] PA   Encounter Date: 09/05/2021   PT End of Session - 09/05/21 1027     Visit Number 21    Number of Visits 41    Date for PT Re-Evaluation 11/12/21    Authorization Type [employer]- authorized 16 visits 4/4-6/1    Authorization - Visit Number 5    Authorization - Number of Visits 16    Progress Note Due on Visit 20    PT Start Time 1013    PT Stop Time 1058    PT Time Calculation (min) 45 min    Equipment Utilized During Treatment Gait belt    Activity Tolerance Patient tolerated treatment well;No increased pain    Behavior During Therapy [facility] for tasks assessed/performed             Past Medical History:  Diagnosis Date   Back pain 04/22/2012   Bone spur    Bulging disc 04/22/2012   Degenerative disc disease    Osteoarthritis    Panic anxiety syndrome    Stroke [facility]) 10/2020   Taking multiple medications for chronic disease     Past Surgical History:  Procedure Laterality Date   CYSTECTOMY     head   HERNIA REPAIR Left 2006,2014   [facility]   TOE SURGERY Right 2007    There were no vitals filed for this visit.   Subjective Assessment - 09/05/21 1018     Subjective Patient reports feeling about the same. still having back pain;    Pertinent History Pt. is a 51 y.o. who was diagnosed with a CVA on July 21st, 2022. Pt. completed several weeks of  inpatient rehab at [facility]. After returning to home. He was discharged in late August/early September and recieved home health PT. Pt. sustained a fall in december of 2022, and was admitted to the hospital with COVID-19, and Chronic back pain. He reports chronic back pain syndrome since 2012. Since the most recent  discharge, Pt. has been residing with the ex-wife until the Pt. is ready to return to independent living. He did recieve 2 weeks of home health after discharge in December 2022. He is now being referred to outpatient PT to address weakness from stroke and improve fine motor movement. He reports rarely getting numbness/tingling in RLE. PMHx includes: Bilateral LBP with right sided sciatica, Lumbosacral spondylosis myelopathy, closed compression Fx of L1. Has chronic insomnia. osteoarthritis, Panic anxiety syndrome (taking medication) Pt. enjoys cooking, and riding motorcycles. Patient is going to [facility] for Epidural spinal injections on 06/07/21;    Limitations Sitting;Walking    How long can you sit comfortably? 20 min;    How long can you stand comfortably? 5-10 min;    How long can you walk comfortably? able to walk long ways (>500 feet)    Diagnostic tests MRI Nov 2022- Stable chronic burst L1 fracture, progressive disc degeneration with disc bulging  eccentric to the left at L3-4 and L4-5. spinal stenosis at L5-S1;    Patient Stated Goals "I Need all the help  I can get. I want to move around better and reduce my back pain."    Currently in Pain? Yes    Pain Score 3     Pain  Location Back    Pain Orientation Lower    Pain Descriptors / Indicators Aching;Sore    Pain Type Chronic pain    Pain Onset More than a month ago    Pain Frequency Constant    Aggravating Factors  bending/lifting/prolonged sitting    Pain Relieving Factors heat/walking    Effect of Pain on Daily Activities decreased activity tolerance;    Multiple Pain Sites No                      Instructed patient in LE strengthening exercise: Standing on bottom step, lifting LE up to 3rd step against green tband for hip flexor strengthening 2x15 reps    Standing hip abduction green tband x15 reps each LE; Standing hip extension green tband x10 reps each LE;  Patient required cues to keep foot oriented  forward for less hip ER to isolate abductor strengthening;  Required intermittent rail assist;    Single leg heel raises x10 reps each LE, does report moderate difficulty with RLE but is able to exhibit better ROM compared to previous sessions;   Instructed patient in balance exercise: Negotiating obstacles course: Weaving around cones #4, stepping over orange hurdle and then up and over airex pad x2 laps Progressed to side stepping through obstacles x1 lap each direction Progressed to  backward weaving through cones #4 Attempted backward stepping over orange hurdle, 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   Legs apart: Windshield wiper trunk rotation stretch x5 reps each x2 sets; Hamstring stretch (BLE) x 30 sec x1 each leg.              Progressed to hamstring neural stretch with ankle DF/PF x10 reps each LE, moderate tightness noted on RLE;  Knee to chest (BLE) x20 sec x1 each leg Manual hip circles (CW/CCW) x 5 reps each direction and each LE LE crossed over contralateral LE for IT band stretch x5 sec hold x3 reps each LE;      Patient tolerated stretches well reporting a reduction in back stiffness. He is still having pain but reports less stiffness;      Patient tolerated session well. He denies any increase in back pain but reports his pain has not decreased. He reports overall exercises are getting easier as he is getting stronger.                        PT Education - 09/06/21 0924     Education Details LE strengthening, balance;    Person(s)  Educated Patient    Methods Explanation;Verbal cues    Comprehension Verbalized understanding;Returned demonstration;Verbal cues required;Need further instruction              PT Short Term Goals - 08/20/21 1001       PT SHORT TERM GOAL #1   Title Patient will be adherent to HEP at least 3x a week to improve functional strength and balance for better safety at home.    Baseline 4/4: doing them 1-2x a week; 08/20/2021=Patient reports performing  his low back stretching and no questions currently HEP.    Time 4    Period Weeks    Status Achieved    Target Date 08/21/21      PT SHORT TERM GOAL #2   Title Patient (< 89 years old) will complete five times sit to stand test in < 15 seconds indicating an increased LE strength and improved balance.    Baseline 4/4: 17.85 sec with arms across chest; 08/20/2021= 17.6 sec    Time 4    Period Weeks    Status On-going    Target Date 10/01/21               PT Long Term Goals - 08/20/21 1001       PT LONG TERM GOAL #1   Title Patient will increase six minute walk test distance to >1000 for progression to community ambulator and improve gait ability    Baseline 4/4: 1115 feet    Time 4    Period Weeks    Status Achieved    Target Date 08/21/21      PT LONG TERM GOAL #2   Title Patient will ascend/descend 8 stairs without rail assist independently without loss of balance to improve ability to get in/out of home.    Baseline 4/4: requires 1 rail assist; 08/20/2021- Patient demonstrated steps without railing using reciprocal steps today.    Time 4    Period Weeks    Status Achieved    Target Date 08/21/21      PT LONG TERM GOAL #3   Title Patient will increase BLE gross strength to 4+/5 as to improve functional strength for independent gait, increased standing tolerance and increased ADL ability.    Baseline 4/4: not formally assessed; 08/20/2021= 4+/5 with Right hip flex/knee ext/flex; Right hip abd= 4/5    Time 12    Period Weeks     Status Partially Met    Target Date 11/12/21      PT LONG TERM GOAL #4   Title Patient will improve FOTO score to >50% to indicate improved functional mobility with less pain with ADLs.    Baseline 4/4: 35%; 08/20/2021= 42%    Time 12    Period Weeks    Status On-going    Target Date 11/12/21      PT LONG TERM GOAL #5   Title Patient will report a worst pain of 4/10 in low back over last week to indicate improved tolerance with ADLs.    Baseline 4/4: 7/10; 08/20/2021= 4/10    Time 12    Period Weeks    Status Partially Met    Target Date 11/12/21      Additional Long Term Goals   Additional Long Term Goals Yes      PT LONG TERM GOAL #6   Title Pt will increase 10MWT by at least 0.13 m/s in order to demonstrate clinically significant improvement in community ambulation    Baseline 08/20/2021=0.94 m/s    Time 12    Period Weeks    Status New    Target Date 11/12/21      PT LONG TERM GOAL #7   Title Patient will demonstrate improved dynamic standing balance as seen by ability to single leg stance on right LE > 12 sec consistently for optimal balance on level and unlevel surfaces.    Baseline 08/20/2021= 4 sec SLS on right    Time 12    Period Weeks  Status New    Target Date 11/12/21                   Plan - 09/06/21 0925     Clinical Impression Statement Patient motivated and participatd well within session. he is still having chronic back pain but is able to tolerate exercise. Patient instructed in advanced LE strengthening. he does exhibit increased apraxia in RLE requiring cues for proper positioning/motor control. He does report moderate difficulty with advanced exercise. He denies any increase in back pain. Pt instructed in LE stretches at end of session to help improve flexibility. He tolerated well reporting less stiffness at end of session. Pt would benefit from additional skilled PT Intervention to improve strength, balance and mobility;    Personal Factors and  Comorbidities Comorbidity 3+;Past/Current Experience;Time since onset of injury/illness/exacerbation    Comorbidities PMHx includes: Bilateral LBP with right sided sciatica, Lumbosacral spondylosis myelopathy, closed compression Fx of L1. Has chronic insomnia. osteoarthritis, Panic anxiety syndrome (taking medication)    Examination-Activity Limitations Bend;Carry;Locomotion Level;Sit;Squat;Stairs;Stand;Transfers    Examination-Participation Restrictions Cleaning;Community Activity;Driving;Laundry;Meal Prep;Occupation;Shop;Volunteer;Yard Work    Stability/Clinical Decision Making Stable/Uncomplicated    Rehab Potential Good    PT Frequency 2x / week    PT Duration 12 weeks    PT Treatment/Interventions Cryotherapy;Electrical Stimulation;Moist Heat;Traction;Gait training;Stair training;Functional mobility training;Therapeutic activities;Therapeutic exercise;Balance training;Neuromuscular re-education;Patient/family education;Manual techniques;Passive range of motion;Dry needling;Energy conservation    PT Next Visit Plan Progress core stabilization, Manual therapy for low back and LE ROM    Consulted and Agree with Plan of Care Patient             Patient will benefit from skilled therapeutic intervention in order to improve the following deficits and impairments:  Abnormal gait, Decreased endurance, Decreased mobility, Difficulty walking, Increased muscle spasms, Decreased range of motion, Decreased activity tolerance, Decreased strength, Postural dysfunction, Pain  Visit Diagnosis: Apraxia  Muscle weakness (generalized)  Other lack of coordination  Abnormality of gait and mobility  Difficulty in walking, not elsewhere classified  Unsteadiness on feet  Chronic bilateral low back pain without sciatica     Problem List Patient Active Problem List   Diagnosis Date Noted   Alterations of sensations following cerebrovascular accident 03/08/2021   Aphasia as late effect of  cerebrovascular accident (CVA) 03/08/2021   Muscle spasm 02/22/2021   Acute non-recurrent frontal sinusitis 02/22/2021   Chronic pain syndrome 01/23/2021   Primary hypertension 01/23/2021   Nerve pain 01/23/2021   Erectile dysfunction due to diseases classified elsewhere 01/23/2021   Cognitive dysfunction 01/23/2021   Scrotal edema 01/23/2021   Elevated LDL cholesterol level 01/23/2021   History of stroke with residual deficit 01/23/2021   Primary insomnia 01/23/2021   Mouth pain 01/23/2021   Laceration of right hand without foreign body    Closed compression fracture of L1 vertebra ([place]) 05/24/2016   Lumbar stenosis with neurogenic claudication 11/01/2015   Chronic 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 11/08/2014   Hx of hemorrhoids 11/08/2014   AAA (abdominal aortic aneurysm) ([place]) 09/22/2014   Chronic pain associated with significant psychosocial dysfunction 09/22/2014   Panic disorder 09/22/2014   AB (asthmatic bronchitis) 08/17/2014   Anxiety 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 08/17/2014   Backache 08/17/2014   Lumbosacral spondylosis without myelopathy 08/17/2014   Disorder of male genital organs 08/17/2014   Brash 08/17/2014   Low back pain 08/17/2014   Tendon nodule 08/17/2014   Episodic paroxysmal anxiety disorder 08/17/2014   Hernia, inguinal, right 08/17/2014  Fast heart beat 08/17/2014   Illness 08/17/2014   Inguinal hernia 10/14/2012    Virgil Slinger, PT, DPT 09/06/2021, 9:38 AM  [place] MAIN [facility] SERVICES [address] [place], [place], [address] Phone: [phone]   Fax:  [phone]  Name: David Reeves MRN: [id number] Date of Birth: 03-04-1971

## 2021-09-06 ENCOUNTER — Encounter: Payer: Self-pay | Admitting: Physical Therapy

## 2021-09-06 NOTE — Therapy (Signed)
[place] MAIN [facility] SERVICES [address] [place], [place], [address] Phone: [phone]   Fax:  [phone]  Occupational Therapy Treatment  Patient Details  Name: David Reeves MRN: [id number] Date of Birth: 07/23/1970 Referring Provider (OT): [name]   Encounter Date: 09/05/2021   OT End of Session - 09/06/21 0958     Visit Number 28    Number of Visits 44    Date for OT Re-Evaluation 11/01/21    Authorization Type Progress reporting period starting 08/10/21    OT Start Time 1015    OT Stop Time 1100    OT Time Calculation (min) 45 min    Activity Tolerance Patient tolerated treatment well    Behavior During Therapy [facility] for tasks assessed/performed             Past Medical History:  Diagnosis Date   Back pain 04/22/2012   Bone spur    Bulging disc 04/22/2012   Degenerative disc disease    Osteoarthritis    Panic anxiety syndrome    Stroke [facility]) 10/2020   Taking multiple medications for chronic disease     Past Surgical History:  Procedure Laterality Date   CYSTECTOMY     head   HERNIA REPAIR Left 2006,2014   [facility]   TOE SURGERY Right 2007    There were no vitals filed for this visit.   Subjective Assessment - 09/05/21 0955     Subjective  "I've been working on my finger exercises."    Patient is accompanied by: Family member    Pertinent History Pt. is a 51 y.o. who was diagnosed with a CVA on July 21st, 2022. Pt. completed several weeks of  inpatient rehab at [facility]. After returning to home, Pt. sustained a fall in december of 2022, and was admitted to the hospital with COVI-19, and back pain from the fall. Since the most recent discharge, Pt. has been residing with the ex-wife until the Pt. is ready to return to independent living. Pt. PMHx includes: Bilateral LBP with right sided sciatica, Lumbosacral spondylosis myelopathy, closed compression Fx of L1. Pt. enjoys cooking, and riding motorcycles.     Patient Stated Goals Pt would like to be as independent as he was before, improve hand function.    Currently in Pain? Yes    Pain Score 3     Pain Location Back    Pain Orientation Lower    Pain Descriptors / Indicators Aching;Sore    Pain Type Chronic pain    Pain Radiating Towards low back    Pain Onset More than a month ago    Pain Frequency Constant    Aggravating Factors  bending/lifting/prolonged sitting    Pain Relieving Factors heat/walking    Effect of Pain on Daily Activities decreased activity tolerance    Multiple Pain Sites No            Occupational Therapy Treatment: Neuro re-ed: Pt participated in hand taps on table to facilitate active wrist flex/ext.  Performed digit abd/add, requiring cues for technique and slow speed, as well as min A and vc to minimize digit flexion during add; completed 3 sets 10 reps each.  Performed AAROM for R thumb radial abd/add, OT stabilizing other digits and wrist to target thumb.  Pt performed lateral pinching with R hand x3 sets 10 reps using yellow clothespin.  OT provided set up of clothespin in hand and kept wrist and other digits stabilized to isolate thumb  movement.  Performed AAROM for R forearm pron/sup, OT providing tactile cues at R elbow for positioning at side, and helped to guide pron/sup for smooth movement patterns x3 sets 10 reps each.  Pt worked with Teacher, adult education pegs/pegboard at table top level, working to remove pegs from board and place onto [employer] on beside pegboard.  With extra time, rest, and multiple trials, occasional min A, pt was able to remove pegs with a 2point and sometimes a 3 point grasp pattern maintaining thumb position without slipping 50% of time.    Response to Treatment: Pt continues to develop RUE coordination, and is improving with digit isolation exercises.  Pt requires OT to stabilize R wrist and other digits when targeting thumb movements in any plane.  Pt continues to require frequent rest  breaks today d/t hand fatigue, but recovers well with rest breaks and intermittent WB and passive wrist and digit extension stretching between repetitions of hand exercises.  Pt will continue to benefit from skilled OT for maximizing strength and coordination throughout RUE while working to isolate hand and digits to better engage dominant arm during daily tasks.    OT Education - 09/05/21 0956     Education Details Isolating the 2nd digit and thumb, 2pt. and 3point grasp patterns.    Person(s) Educated Patient    Methods Explanation;Demonstration;Tactile cues;Verbal cues    Comprehension Verbalized understanding;Returned demonstration;Verbal cues required;Tactile cues required;Need further instruction              OT Short Term Goals - 08/10/21 1330       OT SHORT TERM GOAL #1   Title Pt. wil demonstrtae independence with HEPs    Baseline Eval: Pt. currently does not have one, 10th visit:  changes in HEP ongoing; 20th: instructed in putty exercises, changes ongoing    Time 6    Period Weeks    Status On-going    Target Date 09/20/21               OT Long Term Goals - 08/10/21 1331       OT LONG TERM GOAL #1   Title Pt. will improve RUE strength by 2 mm grades to assist with ADLs, and IADLs,    Baseline Eval: Right shoulder flexion, abduction 3/5, elbow flexion, extension wrist extension 3+/5, 10th visit: improving with RUE strength by not yet met goal; 20th visit: R shd flex/abd 4-, elbow flex/ext 4+, wrist flex 4-, wrist ext 4+    Time 12    Period Weeks    Status On-going    Target Date 11/01/21      OT LONG TERM GOAL #2   Title Pt. will improve right grip by 10 lbs to prepare for firmly holding objects for IADLs.    Baseline Eval: R: 38#, L: 124# (In 3rd dynamometer slot); R 35# in 4th slot, 28# in 3rd slot    Time 12    Period Weeks    Status On-going    Target Date 11/01/21      OT LONG TERM GOAL #3   Title Pt. will improve right lateral pinch strength by  5# to assist with cutting food    Baseline Eval: R: 17, L: 25; 20th: R: 18    Time 12    Period Weeks    Status On-going    Target Date 11/01/21      OT LONG TERM GOAL #4   Title Pt. will improve Right 3pt pinch by 2# to be  able to hold/open items for cooking    Baseline Eval: R: Pt. unable to engage thumb L: 29#; 20th: R unable    Time 12    Period Weeks    Status On-going    Target Date 11/01/21      OT LONG TERM GOAL #5   Title Pt. will improve right hand [facility] skills to be able to independently manipulate buttons, and zippers.    Baseline Eval: Pt. has difficulty managing buttons,a nd zippers. 10th visit:  improving; 20th: unable to engage R hand effectively, performs 1 handed with L    Time 12    Period Weeks    Status On-going    Target Date 11/01/21      OT LONG TERM GOAL #6   Title Pt. will independently write his name    Baseline Eval: Pt. is unable to hold a pen; 20th: pt can grip [employer] with R hand and can make marks on paper with difficulty, but not yet writing name    Time 12    Period Weeks    Status On-going    Target Date 11/01/21      OT LONG TERM GOAL #7   Title Pt. will improve FOTO score by 2 points to reflect funational improvement    Baseline Eval: FOTO score 46 with TR score 52; 20th: 45    Time 12    Period Weeks    Status On-going    Target Date 11/01/21              Plan - 09/05/21 1008     Clinical Impression Statement Pt continues to develop RUE coordination, and is improving with digit isolation exercises.  Pt requires OT to stabilize R wrist and other digits when targeting thumb movements in any plane.  Pt continues to require frequent rest breaks today d/t hand fatigue, but recovers well with rest breaks and intermittent WB and passive wrist and digit extension stretching between repetitions of hand exercises.  Pt will continue to benefit from skilled OT for maximizing strength and coordination throughout RUE while working to isolate hand  and digits to better engage dominant arm during daily tasks.    OT Occupational Profile and History Detailed Assessment- Review of Records and additional review of physical, cognitive, psychosocial history related to current functional performance    Occupational performance deficits (Please refer to evaluation for details): ADL's;IADL's;Education    Rehab Potential Good    Clinical Decision Making Several treatment options, min-mod task modification necessary    Comorbidities Affecting Occupational Performance: May have comorbidities impacting occupational performance    Modification or Assistance to Complete Evaluation  Min-Moderate modification of tasks or assist with assess necessary to complete eval    OT Frequency 2x / week    OT Duration 12 weeks    OT Treatment/Interventions Self-care/ADL training;DME and/or AE instruction;Therapeutic exercise;Ultrasound;Neuromuscular education;Therapeutic activities;Energy conservation;Moist Heat;Patient/family education;Splinting;Functional Mobility Training;Paraffin    Consulted and Agree with Plan of Care Patient             Patient will benefit from skilled therapeutic intervention in order to improve the following deficits and impairments:           Visit Diagnosis: Muscle weakness (generalized)  Other lack of coordination  H/O ischemic left MCA stroke    Problem List Patient Active Problem List   Diagnosis Date Noted   Alterations of sensations following cerebrovascular accident 03/08/2021   Aphasia as late effect of cerebrovascular accident (CVA) 03/08/2021  Muscle spasm 02/22/2021   Acute non-recurrent frontal sinusitis 02/22/2021   Chronic pain syndrome 01/23/2021   Primary hypertension 01/23/2021   Nerve pain 01/23/2021   Erectile dysfunction due to diseases classified elsewhere 01/23/2021   Cognitive dysfunction 01/23/2021   Scrotal edema 01/23/2021   Elevated LDL cholesterol level 01/23/2021   History of stroke  with residual deficit 01/23/2021   Primary insomnia 01/23/2021   Mouth pain 01/23/2021   Laceration of right hand without foreign body    Closed compression fracture of L1 vertebra ([place]) 05/24/2016   Lumbar stenosis with neurogenic claudication 11/01/2015   Chronic 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 11/08/2014   Hx of hemorrhoids 11/08/2014   AAA (abdominal aortic aneurysm) ([place]) 09/22/2014   Chronic pain associated with significant psychosocial dysfunction 09/22/2014   Panic disorder 09/22/2014   AB (asthmatic bronchitis) 08/17/2014   Anxiety 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 08/17/2014   Backache 08/17/2014   Lumbosacral spondylosis without myelopathy 08/17/2014   Disorder of male genital organs 08/17/2014   Brash 08/17/2014   Low back pain 08/17/2014   Tendon nodule 08/17/2014   Episodic paroxysmal anxiety disorder 08/17/2014   Hernia, inguinal, right 08/17/2014   Fast heart beat 08/17/2014   Illness 08/17/2014   Inguinal hernia 10/14/2012   Leta Speller, MS, OTR/L  Darleene Cleaver, OT 09/06/2021, 10:09 AM  [place] MAIN [facility] SERVICES [address] [place], [place], [address] Phone: [phone]   Fax:  [phone]  Name: David Reeves MRN: [id number] Date of Birth: 03/25/1971

## 2021-09-07 NOTE — Therapy (Signed)
[place] MAIN [facility] SERVICES [address] [place], [place], [address] Phone: [phone]   Fax:  [phone]  Speech Language Pathology Treatment 10th VISIT PROGRESS NOTE Patient Details  Name: David Reeves MRN: [id number] Date of Birth: November 04, 1970 Referring Provider (SLP): [name]   Encounter Date: 09/05/2021   End of Session - 09/07/21 0835     Visit Number 20    Number of Visits 42    Date for SLP Re-Evaluation 11/20/21    Authorization Type [employer]    Authorization Time Period 08/27/2021 thru 11/20/2021    Authorization - Visit Number 10    Progress Note Due on Visit 10    SLP Start Time 0800    SLP Stop Time  0900    SLP Time Calculation (min) 60 min    Activity Tolerance Patient tolerated treatment well;No increased pain             Past Medical History:  Diagnosis Date   Back pain 04/22/2012   Bone spur    Bulging disc 04/22/2012   Degenerative disc disease    Osteoarthritis    Panic anxiety syndrome    Stroke [facility]) 10/2020   Taking multiple medications for chronic disease     Past Surgical History:  Procedure Laterality Date   CYSTECTOMY     head   HERNIA REPAIR Left 2006,2014   [facility]   TOE SURGERY Right 2007    There were no vitals filed for this visit.   Subjective Assessment - 09/07/21 0831     Subjective pt brought in his tablet    Currently in Pain? No/denies                   ADULT SLP TREATMENT - 09/07/21 0001       Treatment Provided   Treatment provided Cognitive-Linquistic      Cognitive-Linquistic Treatment   Treatment focused on Cognition;Patient/family/caregiver education    Skilled Treatment Skilled treatment session target pt's cognition goals.   SLP facilitated session by utilizing pt's tablet to download and instruct in apps that might improve memory, visuospatial organization, sequencing, divided attention and problem solving      Differences  App: Pt required maximal assistance to improve from 7/12 to 12/12 during initial puzzle with overall ability improving to 11/12 on second puzzle attempted.      Bingo Blitz - 1 card - 75% accuracy improving to 100% with moderate cues for increasing processing speed              SLP Education - 09/07/21 0834     Education Details therapy apps    Person(s) Educated Patient    Methods Explanation;Demonstration;Verbal cues    Comprehension Verbalized understanding;Returned demonstration;Need further instruction;Verbal cues required              SLP Short Term Goals - 09/07/21 0836       SLP SHORT TERM GOAL #1   Title Pt will use speech intelligibility strategies to slow rate of speech at the conversational level to achive ~ 60% speech intelligibility.    Baseline previous goal met, goal advanced to reflect progress    Period --   sessions   Status Revised      SLP SHORT TERM GOAL #2   Title Pt will produce fricatives in the initial, final and medial positions of words with 90% accuracy.    Status Deferred      SLP SHORT TERM GOAL #  4   Title Pt will answer [place] questions after reading short story (5-6 sentences) with 50% accuracy given min to mod cues.    Time 10    Period --   sessions   Status On-going      SLP SHORT TERM GOAL #5   Title Pt will a) recall and b) demonstrate use of at least three external memory strategies.    Time 10    Period --   sessions   Status On-going              SLP Long Term Goals - 09/07/21 0837       SLP LONG TERM GOAL #1   Title Pt will use speech intelligibility strategies to achieve 60% speech intelligibility at the simple conversation level.    Baseline goal met, revised to reflect progress    Time 12    Period Weeks    Status Revised    Target Date 11/20/21      SLP LONG TERM GOAL #2   Title Patient will demonstrate improved cognitive linguistic function for IND completion of ADLs tasks in home/community environments     Target Date 11/20/21              Plan - 09/07/21 0835     Clinical Impression Statement Pt presents with moderate difficulty performing cognitive tasks related to basic to semi-complex problem solving. Skilled ST intervention continues to be required to target his moderate cognitive impairments to improve quality of life, functional independence and reduce caregiver burden.    Speech Therapy Frequency 2x / week    Duration 12 weeks    Treatment/Interventions Language facilitation;Compensatory techniques;Cueing hierarchy;Internal/external aids;SLP instruction and feedback;Patient/family education;Compensatory strategies;Functional tasks;Cognitive reorganization    Potential to Achieve Goals Good    Potential Considerations Other (comment);Severity of impairments    SLP Home Exercise Plan provided, see pt instructions section    Consulted and Agree with Plan of Care Patient             Patient will benefit from skilled therapeutic intervention in order to improve the following deficits and impairments:   Apraxia  Cognitive communication deficit  Cognitive dysfunction  H/O ischemic left MCA stroke  H/O ischemic left ACA stroke    Problem List Patient Active Problem List   Diagnosis Date Noted   Alterations of sensations following cerebrovascular accident 03/08/2021   Aphasia as late effect of cerebrovascular accident (CVA) 03/08/2021   Muscle spasm 02/22/2021   Acute non-recurrent frontal sinusitis 02/22/2021   Chronic pain syndrome 01/23/2021   Primary hypertension 01/23/2021   Nerve pain 01/23/2021   Erectile dysfunction due to diseases classified elsewhere 01/23/2021   Cognitive dysfunction 01/23/2021   Scrotal edema 01/23/2021   Elevated LDL cholesterol level 01/23/2021   History of stroke with residual deficit 01/23/2021   Primary insomnia 01/23/2021   Mouth pain 01/23/2021   Laceration of right hand without foreign body    Closed compression fracture of L1  vertebra ([place]) 05/24/2016   Lumbar stenosis with neurogenic claudication 11/01/2015   Chronic 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 11/08/2014   Hx of hemorrhoids 11/08/2014   AAA (abdominal aortic aneurysm) ([place]) 09/22/2014   Chronic pain associated with significant psychosocial dysfunction 09/22/2014   Panic disorder 09/22/2014   AB (asthmatic bronchitis) 08/17/2014   Anxiety 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 08/17/2014   Backache 08/17/2014   Lumbosacral spondylosis without myelopathy 08/17/2014   Disorder of male genital organs 08/17/2014  Brash 08/17/2014   Low back pain 08/17/2014   Tendon nodule 08/17/2014   Episodic paroxysmal anxiety disorder 08/17/2014   Hernia, inguinal, right 08/17/2014   Fast heart beat 08/17/2014   Illness 08/17/2014   Inguinal hernia 10/14/2012   Maleni Seyer B. Rutherford Nail M.S., CCC-SLP, [facility] Pathologist Rehabilitation Services Office [phone]  Stormy Fabian, [place] 09/07/2021, 8:39 AM  [place] MAIN [facility] SERVICES [address] [place], [place], [address] Phone: [phone]   Fax:  [phone]   Name: Brittney Caraway MRN: [id number] Date of Birth: February 19, 1971

## 2021-09-07 NOTE — Patient Instructions (Signed)
Use apps on tablet each day

## 2021-09-10 ENCOUNTER — Telehealth: Payer: Self-pay | Admitting: Speech Pathology

## 2021-09-10 ENCOUNTER — Ambulatory Visit: Payer: Medicare HMO | Admitting: Speech Pathology

## 2021-09-10 ENCOUNTER — Ambulatory Visit: Payer: Medicare HMO

## 2021-09-10 ENCOUNTER — Ambulatory Visit: Payer: Medicare HMO | Admitting: Physical Therapy

## 2021-09-10 NOTE — Telephone Encounter (Signed)
Patient cancelled his therapy sessions this morning. I reached via phone to pt and he stated that he was in "more pain than usual." He intended to come but his pain was too intense.   Confirmed next scheduled appt with pt.   Phynix Horton B. Dreama Saa M.S., CCC-SLP, [facility] Speech-Language Pathologist Rehabilitation Services Office [phone]

## 2021-09-12 ENCOUNTER — Encounter: Payer: Self-pay | Admitting: Physical Therapy

## 2021-09-12 ENCOUNTER — Ambulatory Visit: Payer: Medicare HMO | Admitting: Physical Therapy

## 2021-09-12 ENCOUNTER — Encounter: Payer: Self-pay | Admitting: Physician Assistant

## 2021-09-12 ENCOUNTER — Ambulatory Visit: Payer: Medicare HMO | Admitting: Speech Pathology

## 2021-09-12 ENCOUNTER — Ambulatory Visit: Payer: Medicare HMO

## 2021-09-12 DIAGNOSIS — R269 Unspecified abnormalities of gait and mobility: Secondary | ICD-10-CM | POA: Diagnosis not present

## 2021-09-12 DIAGNOSIS — F419 Anxiety disorder, unspecified: Secondary | ICD-10-CM | POA: Diagnosis not present

## 2021-09-12 DIAGNOSIS — R278 Other lack of coordination: Secondary | ICD-10-CM

## 2021-09-12 DIAGNOSIS — R482 Apraxia: Secondary | ICD-10-CM | POA: Diagnosis not present

## 2021-09-12 DIAGNOSIS — G894 Chronic pain syndrome: Secondary | ICD-10-CM | POA: Diagnosis not present

## 2021-09-12 DIAGNOSIS — R262 Difficulty in walking, not elsewhere classified: Secondary | ICD-10-CM

## 2021-09-12 DIAGNOSIS — I10 Essential (primary) hypertension: Secondary | ICD-10-CM | POA: Diagnosis not present

## 2021-09-12 DIAGNOSIS — M545 Low back pain, unspecified: Secondary | ICD-10-CM | POA: Diagnosis not present

## 2021-09-12 DIAGNOSIS — R41841 Cognitive communication deficit: Secondary | ICD-10-CM | POA: Diagnosis not present

## 2021-09-12 DIAGNOSIS — I69351 Hemiplegia and hemiparesis following cerebral infarction affecting right dominant side: Secondary | ICD-10-CM | POA: Diagnosis not present

## 2021-09-12 DIAGNOSIS — F1120 Opioid dependence, uncomplicated: Secondary | ICD-10-CM | POA: Diagnosis not present

## 2021-09-12 DIAGNOSIS — G8929 Other chronic pain: Secondary | ICD-10-CM | POA: Diagnosis not present

## 2021-09-12 DIAGNOSIS — I69322 Dysarthria following cerebral infarction: Secondary | ICD-10-CM | POA: Diagnosis not present

## 2021-09-12 DIAGNOSIS — F09 Unspecified mental disorder due to known physiological condition: Secondary | ICD-10-CM

## 2021-09-12 DIAGNOSIS — M6281 Muscle weakness (generalized): Secondary | ICD-10-CM

## 2021-09-12 DIAGNOSIS — R2681 Unsteadiness on feet: Secondary | ICD-10-CM | POA: Diagnosis not present

## 2021-09-12 DIAGNOSIS — G8921 Chronic pain due to trauma: Secondary | ICD-10-CM | POA: Diagnosis not present

## 2021-09-12 DIAGNOSIS — F1290 Cannabis use, unspecified, uncomplicated: Secondary | ICD-10-CM | POA: Diagnosis not present

## 2021-09-12 DIAGNOSIS — Z79899 Other long term (current) drug therapy: Secondary | ICD-10-CM | POA: Diagnosis not present

## 2021-09-12 DIAGNOSIS — M791 Myalgia, unspecified site: Secondary | ICD-10-CM | POA: Diagnosis not present

## 2021-09-12 NOTE — Therapy (Signed)
[place] [facility] SERVICES [address] [place], [place], [address] Phone: [phone]   Fax:  [phone]  Physical Therapy Treatment  Patient Details  Name: David Reeves MRN: [id number] Date of Birth: Nov 09, 1970 Referring Provider (PT): [name] PA   Encounter Date: 09/12/2021   PT End of Session - 09/12/21 1001     Visit Number 22    Number of Visits 41    Date for PT Re-Evaluation 11/12/21    Authorization Type [employer]- authorized 16 visits 4/4-6/1    Authorization - Visit Number 5    Authorization - Number of Visits 16    Progress Note Due on Visit 20    PT Start Time 0955    PT Stop Time 1040    PT Time Calculation (min) 45 min    Equipment Utilized During Treatment Gait belt    Activity Tolerance Patient tolerated treatment well;No increased pain    Behavior During Therapy [facility] for tasks assessed/performed             Past Medical History:  Diagnosis Date   Back pain 04/22/2012   Bone spur    Bulging disc 04/22/2012   Degenerative disc disease    Osteoarthritis    Panic anxiety syndrome    Stroke [facility]) 10/2020   Taking multiple medications for chronic disease     Past Surgical History:  Procedure Laterality Date   CYSTECTOMY     head   HERNIA REPAIR Left 2006,2014   [facility]   TOE SURGERY Right 2007    There were no vitals filed for this visit.   Subjective Assessment - 09/12/21 0959     Subjective Pt reports increased back pain today. he reports having increased radicular symptoms over the weekend and has had a diffiuclt time getting comfortable. He states a lot of time the radicular pain will be down the RLE but on Monday he started having LLE radicular symptoms. He reports normally he can get comfortable lying down in the bed but he was unable to get pain relief on Monday; This is why he cancelled on Monday;    Pertinent History Pt. is a 51 y.o. who was diagnosed with a CVA on July  21st, 2022. Pt. completed several weeks of  inpatient rehab at [facility]. After returning to home. He was discharged in late August/early September and recieved home health PT. Pt. sustained a fall in december of 2022, and was admitted to the hospital with COVID-19, and Chronic back pain. He reports chronic back pain syndrome since 2012. Since the most recent discharge, Pt. has been residing with the ex-wife until the Pt. is ready to return to independent living. He did recieve 2 weeks of home health after discharge in December 2022. He is now being referred to outpatient PT to address weakness from stroke and improve fine motor movement. He reports rarely getting numbness/tingling in RLE. PMHx includes: Bilateral LBP with right sided sciatica, Lumbosacral spondylosis myelopathy, closed compression Fx of L1. Has chronic insomnia. osteoarthritis, Panic anxiety syndrome (taking medication) Pt. enjoys cooking, and riding motorcycles. Patient is going to [facility] for Epidural spinal injections on 06/07/21;    Limitations Sitting;Walking    How long can you sit comfortably? 20 min;    How long can you stand comfortably? 5-10 min;    How long can you walk comfortably? able to walk long ways (>500 feet)    Diagnostic tests MRI Nov 2022- Stable chronic burst  L1 fracture, progressive disc degeneration with disc bulging  eccentric to the left at L3-4 and L4-5. spinal stenosis at L5-S1;    Patient Stated Goals "I Need all the help  I can get. I want to move around better and reduce my back pain."    Currently in Pain? Yes    Pain Score 5     Pain Location Back    Pain Orientation Lower    Pain Descriptors / Indicators Aching;Sore    Pain Type Chronic pain    Pain Onset More than a month ago    Pain Frequency Constant    Aggravating Factors  bending/lifting/prolonged sitting    Pain Relieving Factors heat/walking/supine position    Effect of Pain on Daily Activities decreased activity tolerance;    Multiple  Pain Sites No                 TREATMENT: Patient lying in supine with moist heat to low back.   Legs apart: Windshield wiper trunk rotation stretch x5 reps each x2 sets; Hamstring stretch (BLE) x 30 sec x1 each leg.              Progressed to hamstring neural stretch with ankle DF/PF x10 reps each LE, moderate tightness noted on RLE;  Knee to chest (BLE) x20 sec x1 each leg Manual hip circles (CW/CCW) x 5 reps each direction and each LE LE crossed over contralateral LE for IT band stretch x5 sec hold x3 reps each LE;    Hooklying: Posterior pelvic tilt 5 sec hold x10 reps;  Progressed with posterior pelvic tilt:   With alternate march with green tband around BLE, x10 reps;   BLE hip abduction clamshell/hip ER x15 reps;    BLE hip flexion at 90/90 5 sec hold x5 reps with moderate challenge;   Bridge x10 reps with initial pain but then became tolerable with increased repetition;    Patient tolerated stretches well reporting a reduction in back stiffness. He is still having pain but reports less stiffness;      Patient tolerated session well. He denies any increase in back pain but reports his pain has not decreased. He reports overall exercises are getting easier as he is getting stronger.  He was limited today with increased back pain overall. He is supposed to see pain specialist this afternoon;                               PT Education - 09/12/21 1001     Education Details LE strengthening, balance/positioning;    Person(s) Educated Patient    Methods Explanation;Verbal cues    Comprehension Verbalized understanding;Returned demonstration;Verbal cues required;Need further instruction              PT Short Term Goals - 08/20/21 1001       PT SHORT TERM GOAL #1   Title Patient will be adherent to HEP at least 3x a week to improve functional strength and balance for better safety at home.    Baseline 4/4: doing them 1-2x a week;  08/20/2021=Patient reports performing his low back stretching and no questions currently HEP.    Time 4    Period Weeks    Status Achieved    Target Date 08/21/21      PT SHORT TERM GOAL #2   Title Patient (< 28 years old) will complete five times sit to stand test in < 15  seconds indicating an increased LE strength and improved balance.    Baseline 4/4: 17.85 sec with arms across chest; 08/20/2021= 17.6 sec    Time 4    Period Weeks    Status On-going    Target Date 10/01/21               PT Long Term Goals - 08/20/21 1001       PT LONG TERM GOAL #1   Title Patient will increase six minute walk test distance to >1000 for progression to community ambulator and improve gait ability    Baseline 4/4: 1115 feet    Time 4    Period Weeks    Status Achieved    Target Date 08/21/21      PT LONG TERM GOAL #2   Title Patient will ascend/descend 8 stairs without rail assist independently without loss of balance to improve ability to get in/out of home.    Baseline 4/4: requires 1 rail assist; 08/20/2021- Patient demonstrated steps without railing using reciprocal steps today.    Time 4    Period Weeks    Status Achieved    Target Date 08/21/21      PT LONG TERM GOAL #3   Title Patient will increase BLE gross strength to 4+/5 as to improve functional strength for independent gait, increased standing tolerance and increased ADL ability.    Baseline 4/4: not formally assessed; 08/20/2021= 4+/5 with Right hip flex/knee ext/flex; Right hip abd= 4/5    Time 12    Period Weeks    Status Partially Met    Target Date 11/12/21      PT LONG TERM GOAL #4   Title Patient will improve FOTO score to >50% to indicate improved functional mobility with less pain with ADLs.    Baseline 4/4: 35%; 08/20/2021= 42%    Time 12    Period Weeks    Status On-going    Target Date 11/12/21      PT LONG TERM GOAL #5   Title Patient will report a worst pain of 4/10 in low back over last week to indicate  improved tolerance with ADLs.    Baseline 4/4: 7/10; 08/20/2021= 4/10    Time 12    Period Weeks    Status Partially Met    Target Date 11/12/21      Additional Long Term Goals   Additional Long Term Goals Yes      PT LONG TERM GOAL #6   Title Pt will increase 10MWT by at least 0.13 m/s in order to demonstrate clinically significant improvement in community ambulation    Baseline 08/20/2021=0.94 m/s    Time 12    Period Weeks    Status New    Target Date 11/12/21      PT LONG TERM GOAL #7   Title Patient will demonstrate improved dynamic standing balance as seen by ability to single leg stance on right LE > 12 sec consistently for optimal balance on level and unlevel surfaces.    Baseline 08/20/2021= 4 sec SLS on right    Time 12    Period Weeks    Status New    Target Date 11/12/21                   Plan - 09/12/21 1020     Clinical Impression Statement Patient tolerated session fair. He does report increased back pain this session. He was instructed in advanced LE stretches and lumbar flexion  exercise to help alleviate back pain. utilized moist heat to low back concurrent with all hooklying exercise for better tolerance. He does exhibit increased tightness in RLE and apraxic movement which could be contributing to some pain in low back. He does require min VCS for proper exercise technique. He reports slight reduction in tightness and soreness with supine exercise. Although he is still having high levels of pain. He would benefit from additional skilled PT intervention to improve strength, balance and mobility;    Personal Factors and Comorbidities Comorbidity 3+;Past/Current Experience;Time since onset of injury/illness/exacerbation    Comorbidities PMHx includes: Bilateral LBP with right sided sciatica, Lumbosacral spondylosis myelopathy, closed compression Fx of L1. Has chronic insomnia. osteoarthritis, Panic anxiety syndrome (taking medication)    Examination-Activity  Limitations Bend;Carry;Locomotion Level;Sit;Squat;Stairs;Stand;Transfers    Examination-Participation Restrictions Cleaning;Community Activity;Driving;Laundry;Meal Prep;Occupation;Shop;Volunteer;Yard Work    Stability/Clinical Decision Making Stable/Uncomplicated    Rehab Potential Good    PT Frequency 2x / week    PT Duration 12 weeks    PT Treatment/Interventions Cryotherapy;Electrical Stimulation;Moist Heat;Traction;Gait training;Stair training;Functional mobility training;Therapeutic activities;Therapeutic exercise;Balance training;Neuromuscular re-education;Patient/family education;Manual techniques;Passive range of motion;Dry needling;Energy conservation    PT Next Visit Plan Progress core stabilization, Manual therapy for low back and LE ROM    Consulted and Agree with Plan of Care Patient             Patient will benefit from skilled therapeutic intervention in order to improve the following deficits and impairments:  Abnormal gait, Decreased endurance, Decreased mobility, Difficulty walking, Increased muscle spasms, Decreased range of motion, Decreased activity tolerance, Decreased strength, Postural dysfunction, Pain  Visit Diagnosis: Apraxia  Muscle weakness (generalized)  Other lack of coordination  Abnormality of gait and mobility  Difficulty in walking, not elsewhere classified  Unsteadiness on feet  Chronic bilateral low back pain without sciatica     Problem List Patient Active Problem List   Diagnosis Date Noted   Alterations of sensations following cerebrovascular accident 03/08/2021   Aphasia as late effect of cerebrovascular accident (CVA) 03/08/2021   Muscle spasm 02/22/2021   Acute non-recurrent frontal sinusitis 02/22/2021   Chronic pain syndrome 01/23/2021   Primary hypertension 01/23/2021   Nerve pain 01/23/2021   Erectile dysfunction due to diseases classified elsewhere 01/23/2021   Cognitive dysfunction 01/23/2021   Scrotal edema 01/23/2021    Elevated LDL cholesterol level 01/23/2021   History of stroke with residual deficit 01/23/2021   Primary insomnia 01/23/2021   Mouth pain 01/23/2021   Laceration of right hand without foreign body    Closed compression fracture of L1 vertebra ([place]) 05/24/2016   Lumbar stenosis with neurogenic claudication 11/01/2015   Chronic 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 11/08/2014   Hx of hemorrhoids 11/08/2014   AAA (abdominal aortic aneurysm) ([employer]) 09/22/2014   Chronic pain associated with significant psychosocial dysfunction 09/22/2014   Panic disorder 09/22/2014   AB (asthmatic bronchitis) 08/17/2014   Anxiety 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 08/17/2014   Backache 08/17/2014   Lumbosacral spondylosis without myelopathy 08/17/2014   Disorder of male genital organs 08/17/2014   Brash 08/17/2014   Low back pain 08/17/2014   Tendon nodule 08/17/2014   Episodic paroxysmal anxiety disorder 08/17/2014   Hernia, inguinal, right 08/17/2014   Fast heart beat 08/17/2014   Illness 08/17/2014   Inguinal hernia 10/14/2012    Jaxzen Vanhorn, PT, DPT 09/12/2021, 10:25 AM  [place] [address] [place], [place], [address] Phone: [phone]   Fax:  [phone]  Name: David Reeves MRN: [id number] Date of Birth: 1970-10-09

## 2021-09-12 NOTE — Therapy (Signed)
[place] MAIN [facility] SERVICES [address] [place], [place], [address] Phone: [phone]   Fax:  [phone]  Occupational Therapy Treatment  Patient Details  Name: David Reeves MRN: [id number] Date of Birth: 03-20-1971 Referring Provider (OT): [name]   Encounter Date: 09/12/2021   OT End of Session - 09/12/21 1703     Visit Number 29    Number of Visits 44    Date for OT Re-Evaluation 11/01/21    Authorization Type Progress reporting period starting 08/10/21    OT Start Time 0900    OT Stop Time 0945    OT Time Calculation (min) 45 min    Activity Tolerance Patient tolerated treatment well    Behavior During Therapy [facility] for tasks assessed/performed             Past Medical History:  Diagnosis Date   Back pain 04/22/2012   Bone spur    Bulging disc 04/22/2012   Degenerative disc disease    Osteoarthritis    Panic anxiety syndrome    Stroke [facility] 10/2020   Taking multiple medications for chronic disease     Past Surgical History:  Procedure Laterality Date   CYSTECTOMY     head   HERNIA REPAIR Left 2006,2014   [facility]   TOE SURGERY Right 2007    There were no vitals filed for this visit.   Subjective Assessment - 09/12/21 0903     Subjective  Pt reports he will see the pain doctor today at 2p.    Patient is accompanied by: Family member    Pertinent History Pt. is a 51 y.o. who was diagnosed with a CVA on July 21st, 2022. Pt. completed several weeks of  inpatient rehab at [facility] After returning to home, Pt. sustained a fall in december of 2022, and was admitted to the hospital with COVI-19, and back pain from the fall. Since the most recent discharge, Pt. has been residing with the ex-wife until the Pt. is ready to return to independent living. Pt. PMHx includes: Bilateral LBP with right sided sciatica, Lumbosacral spondylosis myelopathy, closed compression Fx of L1. Pt. enjoys cooking, and riding  motorcycles.    Patient Stated Goals Pt would like to be as independent as he was before, improve hand function.    Currently in Pain? Yes    Pain Score 4     Pain Location Back    Pain Orientation Lower    Pain Descriptors / Indicators Aching;Sore    Pain Type Chronic pain    Pain Radiating Towards low back    Pain Onset More than a month ago    Pain Frequency Constant    Aggravating Factors  bending/lifting/prolonged sitting    Pain Relieving Factors heat/walking    Effect of Pain on Daily Activities decreased activity tolerance    Multiple Pain Sites No            Occupational Therapy Treatment: Neuro re-ed: Pt participated in R hand digit isolation exercises, including single digit taps (OT held down other digits), digit abd/add, OT provided support to dorsum of hand to maintain flat hand on table top.  Performed wrist flex/ext with a hand tap on table top, working to tap to a slow tempo.  Performed active assisted UD/RD with OT providing stability to forearm to isolate wrist.  Practiced lateral pinch with OT keeping digits 2-5 flexed into palm, OT provided active assist for release of pinch with full thumb extension.  Practiced 3 point pinch prehension to remove jumbo pegs from pegboard.  Initial tactile cues to assist pt with set up of 3 point pinch pattern on peg to prevent thumb from slipping; with repetition and extra time/ multiple trials for each peg, pt was able to remove pegs with this pinch pattern and release into container on table top.   Response to Treatment: Pt intermittently in tears this day d/t back pain.  Improved with hot pack applied for duration of session while pt participated in table top activities for R hand.  Pt will go to the pain clinic today for his first appointment, scheduled at 2pm.  Pt responded well to encouragement today, and was able to appreciate his gains when working with jumbo pegs.  Pt requires vc to avoid shoulder hiking or lifting of his  forearm from the table top when working to isolate R wrist and hand for coordination activities.  Pt can correct with these cues.  Pt continues to work on digit isolation exercises to limit flexor synergy patterns that limit manipulation of items in hand.  Pt continues to require frequent rest breaks today d/t hand fatigue, but recovers well with rest breaks and intermittent WB and passive wrist and digit extension stretching between repetitions of hand exercises.  Pt will continue to benefit from skilled OT for maximizing strength and coordination throughout RUE while working to isolate hand and digits to better engage dominant arm during daily tasks.   OT Education - 09/12/21 1702     Education Details Isolating the 2nd digit and thumb, 2pt. and 3point grasp patterns.    Person(s) Educated Patient    Methods Explanation;Demonstration;Tactile cues;Verbal cues    Comprehension Verbalized understanding;Returned demonstration;Verbal cues required;Tactile cues required;Need further instruction              OT Short Term Goals - 08/10/21 1330       OT SHORT TERM GOAL #1   Title Pt. wil demonstrtae independence with HEPs    Baseline Eval: Pt. currently does not have one, 10th visit:  changes in HEP ongoing; 20th: instructed in putty exercises, changes ongoing    Time 6    Period Weeks    Status On-going    Target Date 09/20/21               OT Long Term Goals - 08/10/21 1331       OT LONG TERM GOAL #1   Title Pt. will improve RUE strength by 2 mm grades to assist with ADLs, and IADLs,    Baseline Eval: Right shoulder flexion, abduction 3/5, elbow flexion, extension wrist extension 3+/5, 10th visit: improving with RUE strength by not yet met goal; 20th visit: R shd flex/abd 4-, elbow flex/ext 4+, wrist flex 4-, wrist ext 4+    Time 12    Period Weeks    Status On-going    Target Date 11/01/21      OT LONG TERM GOAL #2   Title Pt. will improve right grip by 10 lbs to prepare for  firmly holding objects for IADLs.    Baseline Eval: R: 38#, L: 124# (In 3rd dynamometer slot); R 35# in 4th slot, 28# in 3rd slot    Time 12    Period Weeks    Status On-going    Target Date 11/01/21      OT LONG TERM GOAL #3   Title Pt. will improve right lateral pinch strength by 5# to assist with cutting food  Baseline Eval: R: 17, L: 25; 20th: R: 18    Time 12    Period Weeks    Status On-going    Target Date 11/01/21      OT LONG TERM GOAL #4   Title Pt. will improve Right 3pt pinch by 2# to be able to hold/open items for cooking    Baseline Eval: R: Pt. unable to engage thumb L: 29#; 20th: R unable    Time 12    Period Weeks    Status On-going    Target Date 11/01/21      OT LONG TERM GOAL #5   Title Pt. will improve right hand [facility] skills to be able to independently manipulate buttons, and zippers.    Baseline Eval: Pt. has difficulty managing buttons,a nd zippers. 10th visit:  improving; 20th: unable to engage R hand effectively, performs 1 handed with L    Time 12    Period Weeks    Status On-going    Target Date 11/01/21      OT LONG TERM GOAL #6   Title Pt. will independently write his name    Baseline Eval: Pt. is unable to hold a pen; 20th: pt can grip PenAgain with R hand and can make marks on paper with difficulty, but not yet writing name    Time 12    Period Weeks    Status On-going    Target Date 11/01/21      OT LONG TERM GOAL #7   Title Pt. will improve FOTO score by 2 points to reflect funational improvement    Baseline Eval: FOTO score 46 with TR score 52; 20th: 45    Time 12    Period Weeks    Status On-going    Target Date 11/01/21              Plan - 09/12/21 1719     Clinical Impression Statement Pt intermittently in tears this day d/t back pain.  Improved with hot pack applied for duration of session while pt participated in table top activities for R hand.  Pt will go to the pain clinic today for his first appointment, scheduled at  2pm.  Pt responded well to encouragement today, and was able to appreciate his gains when working with jumbo pegs.  Pt requires vc to avoid shoulder hiking or lifting of his forearm from the table top when working to isolate R wrist and hand for coordination activities.  Pt can correct with these cues.  Pt continues to work on digit isolation exercises to limit flexor synergy patterns that limit manipulation of items in hand.  Pt continues to require frequent rest breaks today d/t hand fatigue, but recovers well with rest breaks and intermittent WB and passive wrist and digit extension stretching between repetitions of hand exercises.  Pt will continue to benefit from skilled OT for maximizing strength and coordination throughout RUE while working to isolate hand and digits to better engage dominant arm during daily tasks.    OT Occupational Profile and History Detailed Assessment- Review of Records and additional review of physical, cognitive, psychosocial history related to current functional performance    Occupational performance deficits (Please refer to evaluation for details): ADL's;IADL's;Education    Rehab Potential Good    Clinical Decision Making Several treatment options, min-mod task modification necessary    Comorbidities Affecting Occupational Performance: May have comorbidities impacting occupational performance    Modification or Assistance to Complete Evaluation  Min-Moderate modification of  tasks or assist with assess necessary to complete eval    OT Frequency 2x / week    OT Duration 12 weeks    OT Treatment/Interventions Self-care/ADL training;DME and/or AE instruction;Therapeutic exercise;Ultrasound;Neuromuscular education;Therapeutic activities;Energy conservation;Moist Heat;Patient/family education;Splinting;Functional Mobility Training;Paraffin    Consulted and Agree with Plan of Care Patient             Patient will benefit from skilled therapeutic intervention in order to  improve the following deficits and impairments:           Visit Diagnosis: Apraxia  Muscle weakness (generalized)  Other lack of coordination    Problem List Patient Active Problem List   Diagnosis Date Noted   Alterations of sensations following cerebrovascular accident 03/08/2021   Aphasia as late effect of cerebrovascular accident (CVA) 03/08/2021   Muscle spasm 02/22/2021   Acute non-recurrent frontal sinusitis 02/22/2021   Chronic pain syndrome 01/23/2021   Primary hypertension 01/23/2021   Nerve pain 01/23/2021   Erectile dysfunction due to diseases classified elsewhere 01/23/2021   Cognitive dysfunction 01/23/2021   Scrotal edema 01/23/2021   Elevated LDL cholesterol level 01/23/2021   History of stroke with residual deficit 01/23/2021   Primary insomnia 01/23/2021   Mouth pain 01/23/2021   Laceration of right hand without foreign body    Closed compression fracture of L1 vertebra ([place]) 05/24/2016   Lumbar stenosis with neurogenic claudication 11/01/2015   Chronic 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 11/08/2014   Hx of hemorrhoids 11/08/2014   AAA (abdominal aortic aneurysm) ([place]) 09/22/2014   Chronic pain associated with significant psychosocial dysfunction 09/22/2014   Panic disorder 09/22/2014   AB (asthmatic bronchitis) 08/17/2014   Anxiety 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 08/17/2014   Backache 08/17/2014   Lumbosacral spondylosis without myelopathy 08/17/2014   Disorder of male genital organs 08/17/2014   Brash 08/17/2014   Low back pain 08/17/2014   Tendon nodule 08/17/2014   Episodic paroxysmal anxiety disorder 08/17/2014   Hernia, inguinal, right 08/17/2014   Fast heart beat 08/17/2014   Illness 08/17/2014   Inguinal hernia 10/14/2012   Leta Speller, MS, OTR/L  Darleene Cleaver, OT 09/12/2021, 5:19 PM  [place] MAIN [facility] SERVICES [address] [place], [place], [address] Phone:  [phone]   Fax:  [phone]  Name: David Reeves MRN: [id number] Date of Birth: 1970-08-28

## 2021-09-13 NOTE — Patient Instructions (Signed)
Continue working apps on his phone and his tablet

## 2021-09-13 NOTE — Therapy (Signed)
[facility] MA[facility] REHAB SERVICES [address] [place], [place], [address] Phone: [phone]   Fax:  [phone]  Speech Language Pathology Treatment  Patient Details  Name: David Reeves MRN: [id number] Date of Birth: 06/23/1970 Referring Provider (SLP): Lindsay Drubel   Encounter Date: 09/12/2021   End of Session - 09/13/21 0703     Visit Number 21    Number of Visits 42    Date for SLP Re-Evaluation 11/20/21    Authorization Type [employer]    Authorization Time Period 08/27/2021 thru 11/20/2021    Authorization - Visit Number 1    Progress Note Due on Visit 10    SLP Start Time 0800    SLP Stop Time  0900    SLP Time Calculation (min) 60 min    Activity Tolerance No increased pain;Other (comment)   limited by poor task tolerance            Past Medical History:  Diagnosis Date   Back pain 04/22/2012   Bone spur    Bulging disc 04/22/2012   Degenerative disc disease    Osteoarthritis    Panic anxiety syndrome    Stroke ([facility]) 10/2020   Taking multiple medications for chronic disease     Past Surgical History:  Procedure Laterality Date   CYSTECTOMY     head   HERNIA REPAIR Left 2006,2014   [facility]   TOE SURGERY Right 2007    There were no vitals filed for this visit.   Subjective Assessment - 09/13/21 0701     Subjective Pt in a lot of pain    Currently in Pain? Yes    Pain Score 5     Pain Location Back    Pain Orientation Lower    Pain Descriptors / Indicators Aching;Sore    Pain Type Chronic pain    Pain Onset More than a month ago    Pain Frequency Constant    Pain Relieving Factors heat/walking    Effect of Pain on Daily Activities decreased activity tolerance    Multiple Pain Sites No                   ADULT SLP TREATMENT - 09/13/21 0001       Treatment Provided   Treatment provided Cognitive-Linquistic      Cognitive-Linquistic Treatment   Treatment focused on  Cognition;Patient/family/caregiver education    Skilled Treatment Skilled treatment session target pt's cognition goals.   SLP facilitated session by utilizing pt's tablet to download and instruct in apps that might improve memory, visuospatial organization, sequencing, divided attention and problem solving      Constant Therapy Clinician App utilized:    To target pt's visuospatial organization and sequencing - Repeat a Pattern: Level 2 - 100%; Level 3 - 100%; Level 5 - 25%, unable to improve with maximal multimodal assistance; Level 4 - 50% improving to 90% with moderate multi-modal assistance      Find the same symbol: Level 3 - 90%    Find alternating symbols: Level 2-  50% with maximal multi-modal assistance for use of scanning problem solving strategy      Follow instructions you hear: Level 2: 80% improving to 100% with repetition of instructions      Speech Intelligibility: Pt is Mod I for use of pacing board but unfortunately pt's speech remains ~ 50% intelligible at the sentence level   Rationale for Evaluation and Treatment Rehabilitation                SLP Education - 09/13/21 0702     Education Details wyas to improve task tolerance    Person(s) Educated Patient    Methods Explanation;Demonstration;Verbal cues    Comprehension Verbalized understanding;Need further instruction;Verbal cues required              SLP Short Term Goals - 09/07/21 0836       SLP SHORT TERM GOAL #1   Title Pt will use speech intelligibility strategies to slow rate of speech at the conversational level to achive ~ 60% speech intelligibility.    Baseline previous goal met, goal advanced to reflect progress    Period --   sessions   Status Revised      SLP SHORT TERM GOAL #2   Title Pt will produce fricatives in the initial, final and medial positions of words with 90% accuracy.    Status Deferred      SLP SHORT TERM GOAL #4   Title Pt will answer WH questions after reading short  story (5-6 sentences) with 50% accuracy given min to mod cues.    Time 10    Period --   sessions   Status On-going      SLP SHORT TERM GOAL #5   Title Pt will a) recall and b) demonstrate use of at least three external memory strategies.    Time 10    Period --   sessions   Status On-going              SLP Long Term Goals - 09/07/21 0837       SLP LONG TERM GOAL #1   Title Pt will use speech intelligibility strategies to achieve 60% speech intelligibility at the simple conversation level.    Baseline goal met, revised to reflect progress    Time 12    Period Weeks    Status Revised    Target Date 11/20/21      SLP LONG TERM GOAL #2   Title Patient will demonstrate improved cognitive linguistic function for IND completion of ADLs tasks in home/community environments    Target Date 11/20/21              Plan - 09/13/21 0704     Clinical Impression Statement Pt demonstrates overall poor task tolerance and little insight into cognitive deficits. His comments are concerning for improved cognitive function as he stated "I am growing tired of this exercise (5 pattern sequence)" and then he begins to deflect redirection by stating "any excuse is better than no excuse." Continue to remind brief course of skilled ST intervention in the hopes of progressing pt's functional cognitive abilities to improve his functional independence.    Speech Therapy Frequency 2x / week    Duration 12 weeks    Treatment/Interventions Language facilitation;Compensatory techniques;Cueing hierarchy;Internal/external aids;SLP instruction and feedback;Patient/family education;Compensatory strategies;Functional tasks;Cognitive reorganization    Potential to Achieve Goals Good    Potential Considerations Pain level;Cooperation/participation level;Previous level of function;Family/community support;Severity of impairments    SLP Home Exercise Plan provided, see pt instructions section    Consulted and  Agree with Plan of Care Patient             Patient will benefit from skilled therapeutic intervention in order to improve the following deficits and impairments:   Cognitive communication deficit  Cognitive dysfunction    Problem List Patient Active Problem List   Diagnosis Date Noted   Alterations of sensations following cerebrovascular accident 03/08/2021   Aphasia as late effect   of cerebrovascular accident (CVA) 03/08/2021   Muscle spasm 02/22/2021   Acute non-recurrent frontal sinusitis 02/22/2021   Chronic pain syndrome 01/23/2021   Primary hypertension 01/23/2021   Nerve pain 01/23/2021   Erectile dysfunction due to diseases classified elsewhere 01/23/2021   Cognitive dysfunction 01/23/2021   Scrotal edema 01/23/2021   Elevated LDL cholesterol level 01/23/2021   History of stroke with residual deficit 01/23/2021   Primary insomnia 01/23/2021   Mouth pain 01/23/2021   Laceration of right hand without foreign body    Closed compression fracture of L1 vertebra ([facility]) 05/24/2016   Lumbar stenosis with neurogenic claudication 11/01/2015   Chronic 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 11/08/2014   Hx of hemorrhoids 11/08/2014   AAA (abdominal aortic aneurysm) ([facility]) 09/22/2014   Chronic pain associated with significant psychosocial dysfunction 09/22/2014   Panic disorder 09/22/2014   AB (asthmatic bronchitis) 08/17/2014   Anxiety 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 08/17/2014   Backache 08/17/2014   Lumbosacral spondylosis without myelopathy 08/17/2014   Disorder of male genital organs 08/17/2014   Brash 08/17/2014   Low back pain 08/17/2014   Tendon nodule 08/17/2014   Episodic paroxysmal anxiety disorder 08/17/2014   Hernia, inguinal, right 08/17/2014   Fast heart beat 08/17/2014   Illness 08/17/2014   Inguinal hernia 10/14/2012    B. , M.S., CCC-SLP, CBIS Speech-Language Pathologist Certified Brain Injury Specialist [place]   [facility] Rehabilitation Services Office [phone] Ascom [phone] Fax [phone]   , CCC-SLP 09/13/2021, 7:07 AM  [facility] MA[facility] REHAB SERVICES [address] [place], [place], [address] Phone: [phone]   Fax:  [phone]   Name: David Reeves MRN: [id number] Date of Birth: 11/10/1970  

## 2021-09-19 ENCOUNTER — Encounter: Payer: Self-pay | Admitting: Physical Therapy

## 2021-09-19 ENCOUNTER — Ambulatory Visit: Payer: Medicare HMO

## 2021-09-19 ENCOUNTER — Ambulatory Visit: Payer: Medicare HMO | Admitting: Physical Therapy

## 2021-09-19 ENCOUNTER — Ambulatory Visit: Payer: Medicare HMO | Admitting: Speech Pathology

## 2021-09-19 DIAGNOSIS — R41841 Cognitive communication deficit: Secondary | ICD-10-CM

## 2021-09-19 DIAGNOSIS — R2681 Unsteadiness on feet: Secondary | ICD-10-CM

## 2021-09-19 DIAGNOSIS — F09 Unspecified mental disorder due to known physiological condition: Secondary | ICD-10-CM | POA: Diagnosis not present

## 2021-09-19 DIAGNOSIS — M545 Low back pain, unspecified: Secondary | ICD-10-CM

## 2021-09-19 DIAGNOSIS — R269 Unspecified abnormalities of gait and mobility: Secondary | ICD-10-CM

## 2021-09-19 DIAGNOSIS — R482 Apraxia: Secondary | ICD-10-CM | POA: Diagnosis not present

## 2021-09-19 DIAGNOSIS — M6281 Muscle weakness (generalized): Secondary | ICD-10-CM | POA: Diagnosis not present

## 2021-09-19 DIAGNOSIS — R262 Difficulty in walking, not elsewhere classified: Secondary | ICD-10-CM | POA: Diagnosis not present

## 2021-09-19 DIAGNOSIS — R278 Other lack of coordination: Secondary | ICD-10-CM

## 2021-09-19 NOTE — Therapy (Signed)
[place] MAIN [facility] SERVICES [address] [place], [place], [address] Phone: [phone]   Fax:  [phone]  Occupational Therapy Progress Note Reporting period starting 08/10/21-09/19/21 Patient Details  Name: David Reeves MRN: [id number] Date of Birth: Apr 10, 1971 Referring Provider (OT): [name]   Encounter Date: 09/19/2021   OT End of Session - 09/19/21 2040     Visit Number 30    Number of Visits 44    Date for OT Re-Evaluation 11/01/21    Authorization Type Progress reporting period starting 08/10/21    OT Start Time 0915    OT Stop Time 1000    OT Time Calculation (min) 45 min    Activity Tolerance Patient tolerated treatment well    Behavior During Therapy [facility] for tasks assessed/performed             Past Medical History:  Diagnosis Date   Back pain 04/22/2012   Bone spur    Bulging disc 04/22/2012   Degenerative disc disease    Osteoarthritis    Panic anxiety syndrome    Stroke [facility]) 10/2020   Taking multiple medications for chronic disease     Past Surgical History:  Procedure Laterality Date   CYSTECTOMY     head   HERNIA REPAIR Left 2006,2014   [facility]   TOE SURGERY Right 2007    There were no vitals filed for this visit.   Subjective Assessment - 09/19/21 2038     Subjective  Pt reports he will have a surgical consult for his back on July 29.    Patient is accompanied by: Family member    Pertinent History Pt. is a 51 y.o. who was diagnosed with a CVA on July 21st, 2022. Pt. completed several weeks of  inpatient rehab at [facility]. After returning to home, Pt. sustained a fall in december of 2022, and was admitted to the hospital with COVI-19, and back pain from the fall. Since the most recent discharge, Pt. has been residing with the ex-wife until the Pt. is ready to return to independent living. Pt. PMHx includes: Bilateral LBP with right sided sciatica, Lumbosacral spondylosis myelopathy, closed  compression Fx of L1. Pt. enjoys cooking, and riding motorcycles.    Patient Stated Goals Pt would like to be as independent as he was before, improve hand function.    Currently in Pain? Yes    Pain Score 5     Pain Location Back    Pain Orientation Lower    Pain Descriptors / Indicators Aching;Sore    Pain Type Chronic pain    Pain Radiating Towards low back    Pain Onset More than a month ago    Pain Frequency Constant    Aggravating Factors  bending/lifting/prolonged sitting    Pain Relieving Factors heat/walking    Effect of Pain on Daily Activities decreased activity tolerance    Multiple Pain Sites No                [facility] OT Assessment - 09/20/21 0001       Observation/Other Assessments   Focus on Therapeutic Outcomes (FOTO)  49      Coordination   Right 9 Hole Peg Test unable      Strength   Overall Strength Comments RUE strength: shoulder flexion 4, abduction 4/5, elbow flexion/extension 5/5, wrist extension 5/5 (extensor tone kicks in), wrist flex 4-/5; LUE 5/5      Hand Function   Right Hand Grip (lbs)  54   4th setting out on dynamometer   Right Hand Lateral Pinch 17 lbs   multiple trials d/t thumb slips from pinch gauge   Right Hand 3 Point Pinch --   unable to engage thumb   Left Hand Grip (lbs) 116    Left Hand Lateral Pinch 26 lbs    Left 3 point pinch 27 lbs             Occupational Therapy Treatment/Progress Note: Therapeutic Exercise: Objective measures taken/goals updated.  Reviewed current digit isolation exercises and assisted pt to complete single digit taps, digit abd/add, thumb radial abd and palmar abd with assist to stabilize wrist and hand to minimize wrist and digit extension during these movements.  Instructed pt in wrist flexion exercises gravity assisted, as extensor tone set in with forearm supination and pt struggled to flex wrist against gravity d/t tone.  OT provided proprioceptive input with pt cued to push against Ot's hand in  this gravity assisted position, forearm pronated, min resistance.  Pt to carry over thumb palmar and radial abduction and wrist flexion exercises noted above to add to home program.    Response to Treatment: FOTO score improved from 45 to 49 this reporting period.  Pt presents with improved strength throughout RUE, and pt continues to focus on digit isolation for better manipulation of ADL supplies.  R thumb continues to slip when forming lateral, 2 point, or 3 point pinch, causing frequent dropping when trying to pick up and hold items in hand, though this is improving.  Pt will continue to benefit from skilled OT for increasing distal strength and GMC/FMC skills in order to better engage RUE with daily tasks.                   OT Education - 09/19/21 2040     Education Details HEP progression    Person(s) Educated Patient    Methods Explanation;Demonstration;Tactile cues;Verbal cues    Comprehension Verbalized understanding;Returned demonstration;Verbal cues required;Tactile cues required;Need further instruction              OT Short Term Goals - 08/10/21 1330       OT SHORT TERM GOAL #1   Title Pt. wil demonstrtae independence with HEPs    Baseline Eval: Pt. currently does not have one, 10th visit:  changes in HEP ongoing; 20th: instructed in putty exercises, changes ongoing; 09/19/21: changes ongoing   Time 6    Period Weeks    Status On-going    Target Date 09/20/21               OT Long Term Goals - 08/10/21 1331       OT LONG TERM GOAL #1   Title Pt. will improve RUE strength by 2 mm grades to assist with ADLs, and IADLs,    Baseline Eval: Right shoulder flexion, abduction 3/5, elbow flexion, extension wrist extension 3+/5, 10th visit: improving with RUE strength by not yet met goal; 20th visit: R shd flex/abd 4-, elbow flex/ext 4+, wrist flex 4-, wrist ext 4+; 09/19/21: R shd flex/abd 4/5, elbow flex/ext 5/5, wrist 4-/5, ext 5/5   Time 12    Period  Weeks    Status On-going    Target Date 11/01/21      OT LONG TERM GOAL #2   Title Pt. will improve right grip by 10 lbs to prepare for firmly holding objects for IADLs.    Baseline Eval: R: 38#, L: 124# (In  3rd dynamometer slot); R 35# in 4th slot, 28# in 3rd slot; 09/19/21: 4th slot: 54#   Time 12    Period Weeks    Status On-going    Target Date 11/01/21      OT LONG TERM GOAL #3   Title Pt. will improve right lateral pinch strength by 5# to assist with cutting food    Baseline Eval: R: 17, L: 25; 20th: R: 18; 09/19/21: 17 (multiple trials as thumb slips)   Time 12    Period Weeks    Status On-going    Target Date 11/01/21      OT LONG TERM GOAL #4   Title Pt. will improve Right 3pt pinch by 2# to be able to hold/open items for cooking    Baseline Eval: R: Pt. unable to engage thumb L: 29#; 20th: R unable; 09/19/21: unable   Time 12    Period Weeks    Status On-going    Target Date 11/01/21      OT LONG TERM GOAL #5   Title Pt. will improve right hand [facility] skills to be able to independently manipulate buttons, and zippers.    Baseline Eval: Pt. has difficulty managing buttons,a nd zippers. 10th visit:  improving; 20th: unable to engage R hand effectively, performs 1 handed with L; 09/19/21: performs 1 handed   Time 12    Period Weeks    Status On-going    Target Date 11/01/21      OT LONG TERM GOAL #6   Title Pt. will independently write his name    Baseline Eval: Pt. is unable to hold a pen; 20th: pt can grip PenAgain with R hand and can make marks on paper with difficulty, but not yet writing name   Time 12    Period Weeks    Status On-going    Target Date 11/01/21      OT LONG TERM GOAL #7   Title Pt. will improve FOTO score by 2 points to reflect funational improvement    Baseline Eval: FOTO score 46 with TR score 52; 20th: 45; 09/19/21: 49   Time 12    Period Weeks    Status On-going    Target Date 11/01/21                   Plan - 09/19/21 1250      Clinical Impression Statement FOTO score improved from 45 to 49 this reporting period.  Pt presents with improved strength throughout RUE, and pt continues to focus on digit isolation for better manipulation of ADL supplies.  R thumb continues to slip when forming lateral, 2 point, or 3 point pinch, causing frequent dropping when trying to pick up and hold items in hand, though this is improving.  Pt will continue to benefit from skilled OT for increasing distal strength and GMC/FMC skills in order to better engage RUE with daily tasks.    OT Occupational Profile and History Detailed Assessment- Review of Records and additional review of physical, cognitive, psychosocial history related to current functional performance    Occupational performance deficits (Please refer to evaluation for details): ADL's;IADL's;Education    Rehab Potential Good    Clinical Decision Making Several treatment options, min-mod task modification necessary    Comorbidities Affecting Occupational Performance: May have comorbidities impacting occupational performance    Modification or Assistance to Complete Evaluation  Min-Moderate modification of tasks or assist with assess necessary to complete eval    OT Frequency  2x / week    OT Duration 12 weeks    OT Treatment/Interventions Self-care/ADL training;DME and/or AE instruction;Therapeutic exercise;Ultrasound;Neuromuscular education;Therapeutic activities;Energy conservation;Moist Heat;Patient/family education;Splinting;Functional Mobility Training;Paraffin    Consulted and Agree with Plan of Care Patient             Patient will benefit from skilled therapeutic intervention in order to improve the following deficits and impairments:           Visit Diagnosis: Muscle weakness (generalized)  Other lack of coordination    Problem List Patient Active Problem List   Diagnosis Date Noted   Alterations of sensations following cerebrovascular accident 03/08/2021    Aphasia as late effect of cerebrovascular accident (CVA) 03/08/2021   Muscle spasm 02/22/2021   Acute non-recurrent frontal sinusitis 02/22/2021   Chronic pain syndrome 01/23/2021   Primary hypertension 01/23/2021   Nerve pain 01/23/2021   Erectile dysfunction due to diseases classified elsewhere 01/23/2021   Cognitive dysfunction 01/23/2021   Scrotal edema 01/23/2021   Elevated LDL cholesterol level 01/23/2021   History of stroke with residual deficit 01/23/2021   Primary insomnia 01/23/2021   Mouth pain 01/23/2021   Laceration of right hand without foreign body    Closed compression fracture of L1 vertebra ([place]) 05/24/2016   Lumbar stenosis with neurogenic claudication 11/01/2015   Chronic 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 11/08/2014   Hx of hemorrhoids 11/08/2014   AAA (abdominal aortic aneurysm) ([place]) 09/22/2014   Chronic pain associated with significant psychosocial dysfunction 09/22/2014   Panic disorder 09/22/2014   AB (asthmatic bronchitis) 08/17/2014   Anxiety 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 08/17/2014   Backache 08/17/2014   Lumbosacral spondylosis without myelopathy 08/17/2014   Disorder of male genital organs 08/17/2014   Brash 08/17/2014   Low back pain 08/17/2014   Tendon nodule 08/17/2014   Episodic paroxysmal anxiety disorder 08/17/2014   Hernia, inguinal, right 08/17/2014   Fast heart beat 08/17/2014   Illness 08/17/2014   Inguinal hernia 10/14/2012   Leta Speller, MS, OTR/L  Darleene Cleaver, OT 09/20/2021, 12:51 PM  [place] MAIN [facility] SERVICES [place], [place], [address] Phone: [phone]   Fax:  [phone]  Name: Callan Norden MRN: [id number] Date of Birth: 1971/03/18

## 2021-09-19 NOTE — Therapy (Addendum)
[place] MAIN [facility] SERVICES [address] [place], [place], [address] Phone: [phone]   Fax:  [phone]  Speech Language Pathology Treatment  Patient Details  Name: David Reeves MRN: [id number] Date of Birth: 1970/10/14 Referring Provider (SLP): [name]   Encounter Date: 09/19/2021   End of Session - 09/19/21 2046     Visit Number 22    Number of Visits 42    Date for SLP Re-Evaluation 11/20/21    Authorization Type [employer]    Authorization Time Period 08/27/2021 thru 11/20/2021    Authorization - Visit Number 2    Progress Note Due on Visit 10    SLP Start Time 0800    SLP Stop Time  0900    SLP Time Calculation (min) 60 min    Activity Tolerance Patient tolerated treatment well;No increased pain             Past Medical History:  Diagnosis Date   Back pain 04/22/2012   Bone spur    Bulging disc 04/22/2012   Degenerative disc disease    Osteoarthritis    Panic anxiety syndrome    Stroke [facility]) 10/2020   Taking multiple medications for chronic disease     Past Surgical History:  Procedure Laterality Date   CYSTECTOMY     head   HERNIA REPAIR Left 2006,2014   [facility]   TOE SURGERY Right 2007    There were no vitals filed for this visit.   Subjective Assessment - 09/19/21 2045     Subjective Pt in a lot of pain                   ADULT SLP TREATMENT - 09/19/21 0001       Treatment Provided   Treatment provided Cognitive-Linquistic      Cognitive-Linquistic Treatment   Treatment focused on Cognition;Patient/family/caregiver education    Skilled Treatment Skilled treatment session target pt's cognition goals.   SLP facilitated session by instructing pt in novel semi-complex card game. Pt able to locate game on phone and watched You Tube video. He was Mod I when referring to and replaying video for directions. As a result of his use of external compensatory aids, pt was 90%  accurate in problem solving tasks within game.              SLP Education - 09/19/21 2045     Education Details ways to improve cognitive function    Person(s) Educated Patient    Methods Explanation;Demonstration;Verbal cues    Comprehension Verbalized understanding;Need further instruction              SLP Short Term Goals - 09/07/21 0836       SLP SHORT TERM GOAL #1   Title Pt will use speech intelligibility strategies to slow rate of speech at the conversational level to achive ~ 60% speech intelligibility.    Baseline previous goal met, goal advanced to reflect progress    Period --   sessions   Status Revised      SLP SHORT TERM GOAL #2   Title Pt will produce fricatives in the initial, final and medial positions of words with 90% accuracy.    Status Deferred      SLP SHORT TERM GOAL #4   Title Pt will answer [place] questions after reading short story (5-6 sentences) with 50% accuracy given min to mod cues.    Time 10    Period --  sessions   Status On-going      SLP SHORT TERM GOAL #5   Title Pt will a) recall and b) demonstrate use of at least three external memory strategies.    Time 10    Period --   sessions   Status On-going              SLP Long Term Goals - 09/07/21 0837       SLP LONG TERM GOAL #1   Title Pt will use speech intelligibility strategies to achieve 60% speech intelligibility at the simple conversation level.    Baseline goal met, revised to reflect progress    Time 12    Period Weeks    Status Revised    Target Date 11/20/21      SLP LONG TERM GOAL #2   Title Patient will demonstrate improved cognitive linguistic function for IND completion of ADLs tasks in home/community environments    Target Date 11/20/21              Plan - 09/19/21 2046     Clinical Impression Statement Pt continues to be eager to participate in ST sessions however he is making very slow progress towards cognitive communication goals as a result  of time post onset as well as premorbid lifestyle. Continue to remind brief course of skilled ST intervention in the hopes of progressing pt's functional cognitive abilities to improve his functional independence.    Speech Therapy Frequency 2x / week    Duration 12 weeks    Treatment/Interventions Language facilitation;Compensatory techniques;Cueing hierarchy;Internal/external aids;SLP instruction and feedback;Patient/family education;Compensatory strategies;Functional tasks;Cognitive reorganization    Potential to Achieve Goals Good    Potential Considerations Pain level;Cooperation/participation level;Previous level of function;Family/community support;Severity of impairments    Consulted and Agree with Plan of Care Patient             Patient will benefit from skilled therapeutic intervention in order to improve the following deficits and impairments:   Cognitive communication deficit  Cognitive dysfunction    Problem List Patient Active Problem List   Diagnosis Date Noted   Alterations of sensations following cerebrovascular accident 03/08/2021   Aphasia as late effect of cerebrovascular accident (CVA) 03/08/2021   Muscle spasm 02/22/2021   Acute non-recurrent frontal sinusitis 02/22/2021   Chronic pain syndrome 01/23/2021   Primary hypertension 01/23/2021   Nerve pain 01/23/2021   Erectile dysfunction due to diseases classified elsewhere 01/23/2021   Cognitive dysfunction 01/23/2021   Scrotal edema 01/23/2021   Elevated LDL cholesterol level 01/23/2021   History of stroke with residual deficit 01/23/2021   Primary insomnia 01/23/2021   Mouth pain 01/23/2021   Laceration of right hand without foreign body    Closed compression fracture of L1 vertebra ([place]) 05/24/2016   Lumbar stenosis with neurogenic claudication 11/01/2015   Chronic 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 11/08/2014   Hx of hemorrhoids 11/08/2014   AAA (abdominal aortic aneurysm) ([place])  09/22/2014   Chronic pain associated with significant psychosocial dysfunction 09/22/2014   Panic disorder 09/22/2014   AB (asthmatic bronchitis) 08/17/2014   Anxiety 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 08/17/2014   Backache 08/17/2014   Lumbosacral spondylosis without myelopathy 08/17/2014   Disorder of male genital organs 08/17/2014   Brash 08/17/2014   Low back pain 08/17/2014   Tendon nodule 08/17/2014   Episodic paroxysmal anxiety disorder 08/17/2014   Hernia, inguinal, right 08/17/2014   Fast heart beat 08/17/2014   Illness 08/17/2014   Inguinal  hernia 10/14/2012    Stormy Fabian, [place] 09/19/2021, 8:47 PM  [place] MAIN [facility] SERVICES [address] [place], [place], [address] Phone: [phone]   Fax:  [phone]   Name: Yoni Lobos MRN: [id number] Date of Birth: 08-16-1970

## 2021-09-19 NOTE — Therapy (Signed)
[place] MAIN [facility] SERVICES [address] [place], [place], [address] Phone: [phone]   Fax:  [phone]  Physical Therapy Treatment  Patient Details  Name: David Reeves MRN: [id number] Date of Birth: 11-Mar-1971 Referring Provider (PT): [name] PA   Encounter Date: 09/19/2021   PT End of Session - 09/19/21 0943     Visit Number 23    Number of Visits 41    Date for PT Re-Evaluation 11/12/21    Authorization Type [employer]- authorized 16 visits 4/4-6/1    Authorization - Visit Number 5    Authorization - Number of Visits 16    Progress Note Due on Visit 20    PT Start Time 3893    PT Stop Time 1042    PT Time Calculation (min) 40 min    Equipment Utilized During Treatment Gait belt    Activity Tolerance Patient tolerated treatment well;No increased pain    Behavior During Therapy [facility] for tasks assessed/performed             Past Medical History:  Diagnosis Date   Back pain 04/22/2012   Bone spur    Bulging disc 04/22/2012   Degenerative disc disease    Osteoarthritis    Panic anxiety syndrome    Stroke [facility]) 10/2020   Taking multiple medications for chronic disease     Past Surgical History:  Procedure Laterality Date   CYSTECTOMY     head   HERNIA REPAIR Left 2006,2014   [facility]   TOE SURGERY Right 2007    There were no vitals filed for this visit.   Subjective Assessment - 09/19/21 1002     Subjective Pt reports increased back pain today. He states he isn't having the radicular symptoms today. He did see pain specialist who did double his valium. He has a follow up appt Aug 19 and then is supposed to see spinal surgeon end of July;    Pertinent History Pt. is a 51 y.o. who was diagnosed with a CVA on July 21st, 2022. Pt. completed several weeks of  inpatient rehab at [facility]. After returning to home. He was discharged in late August/early September and recieved home health PT. Pt. sustained a  fall in december of 2022, and was admitted to the hospital with COVID-19, and Chronic back pain. He reports chronic back pain syndrome since 2012. Since the most recent discharge, Pt. has been residing with the ex-wife until the Pt. is ready to return to independent living. He did recieve 2 weeks of home health after discharge in December 2022. He is now being referred to outpatient PT to address weakness from stroke and improve fine motor movement. He reports rarely getting numbness/tingling in RLE. PMHx includes: Bilateral LBP with right sided sciatica, Lumbosacral spondylosis myelopathy, closed compression Fx of L1. Has chronic insomnia. osteoarthritis, Panic anxiety syndrome (taking medication) Pt. enjoys cooking, and riding motorcycles. Patient is going to [facility] spine center for Epidural spinal injections on 06/07/21;    Limitations Sitting;Walking    How long can you sit comfortably? 20 min;    How long can you stand comfortably? 5-10 min;    How long can you walk comfortably? able to walk long ways (>500 feet)    Diagnostic tests MRI Nov 2022- Stable chronic burst L1 fracture, progressive disc degeneration with disc bulging  eccentric to the left at L3-4 and L4-5. spinal stenosis at L5-S1;    Patient Stated Goals "I Need all the  help  I can get. I want to move around better and reduce my back pain."    Currently in Pain? Yes    Pain Score 4     Pain Location Back    Pain Orientation Lower    Pain Descriptors / Indicators Aching;Sore    Pain Type Chronic pain    Pain Onset More than a month ago    Pain Frequency Constant    Aggravating Factors  bending/lifting/prolonged sitting    Pain Relieving Factors heat/walking    Effect of Pain on Daily Activities decreased activity tolerance;    Multiple Pain Sites No                     TREATMENT: Patient lying in supine with moist heat to low back.   Legs apart: Windshield wiper trunk rotation stretch x5 reps each x2 sets; Hamstring  stretch (BLE) x 30 sec x1 each leg.              Progressed to hamstring neural stretch with ankle DF/PF x10 reps each LE, moderate tightness noted on RLE;  Knee to chest (BLE) x20 sec x1 each leg Manual hip circles (CW/CCW) x 5 reps each direction and each LE LE crossed over contralateral LE for IT band stretch x5 sec hold x3 reps each LE;   Pt reports after stretches his pain has decreased to normal 3/10;    Hooklying: Posterior pelvic tilt 5 sec hold x10 reps;             Progressed with posterior pelvic tilt:                         With alternate march with green tband around BLE, x15 reps;                         BLE hip abduction clamshell/hip ER x15 reps;                         Bridge x15 reps with cues for posterior pelvic tilt for better tolerance;    Hooklying with 3# ankle weight on each LE: -SLR hip flexion x12 reps -suspended heel slides x10 reps with cues for positioning and to increase core stabilization for less pull on low back; Pt does have difficulty controlling RLE with increased apraxia.   Left sidelying: RLE hip abduction SLR 2x10 with 3# ankle weight; required therapist assist to maintain left sidelying and avoid compensation with posterior rotation;   Finished with LE stretches Patient hooklying Lumbar trunk rotation with therapist providing overpressure x5 reps each direction RLE piriformis stretch modified 30 sec hold x2 reps with moderate pull reported;      Patient tolerated session well. He denies any increase in back pain.  He reports overall exercises are getting easier as he is getting stronger.   He was limited today with increased back pain overall.                                     PT Education - 09/19/21 0943     Education Details LE strengthening/positioning    Person(s) Educated Patient    Methods Explanation;Verbal cues    Comprehension Verbalized understanding;Returned demonstration;Verbal cues required;Need  further instruction  PT Short Term Goals - 08/20/21 1001       PT SHORT TERM GOAL #1   Title Patient will be adherent to HEP at least 3x a week to improve functional strength and balance for better safety at home.    Baseline 4/4: doing them 1-2x a week; 08/20/2021=Patient reports performing his low back stretching and no questions currently HEP.    Time 4    Period Weeks    Status Achieved    Target Date 08/21/21      PT SHORT TERM GOAL #2   Title Patient (< 53 years old) will complete five times sit to stand test in < 15 seconds indicating an increased LE strength and improved balance.    Baseline 4/4: 17.85 sec with arms across chest; 08/20/2021= 17.6 sec    Time 4    Period Weeks    Status On-going    Target Date 10/01/21               PT Long Term Goals - 08/20/21 1001       PT LONG TERM GOAL #1   Title Patient will increase six minute walk test distance to >1000 for progression to community ambulator and improve gait ability    Baseline 4/4: 1115 feet    Time 4    Period Weeks    Status Achieved    Target Date 08/21/21      PT LONG TERM GOAL #2   Title Patient will ascend/descend 8 stairs without rail assist independently without loss of balance to improve ability to get in/out of home.    Baseline 4/4: requires 1 rail assist; 08/20/2021- Patient demonstrated steps without railing using reciprocal steps today.    Time 4    Period Weeks    Status Achieved    Target Date 08/21/21      PT LONG TERM GOAL #3   Title Patient will increase BLE gross strength to 4+/5 as to improve functional strength for independent gait, increased standing tolerance and increased ADL ability.    Baseline 4/4: not formally assessed; 08/20/2021= 4+/5 with Right hip flex/knee ext/flex; Right hip abd= 4/5    Time 12    Period Weeks    Status Partially Met    Target Date 11/12/21      PT LONG TERM GOAL #4   Title Patient will improve FOTO score to >50% to indicate improved  functional mobility with less pain with ADLs.    Baseline 4/4: 35%; 08/20/2021= 42%    Time 12    Period Weeks    Status On-going    Target Date 11/12/21      PT LONG TERM GOAL #5   Title Patient will report a worst pain of 4/10 in low back over last week to indicate improved tolerance with ADLs.    Baseline 4/4: 7/10; 08/20/2021= 4/10    Time 12    Period Weeks    Status Partially Met    Target Date 11/12/21      Additional Long Term Goals   Additional Long Term Goals Yes      PT LONG TERM GOAL #6   Title Pt will increase 10MWT by at least 0.13 m/s in order to demonstrate clinically significant improvement in community ambulation    Baseline 08/20/2021=0.94 m/s    Time 12    Period Weeks    Status New    Target Date 11/12/21      PT LONG TERM GOAL #7  Title Patient will demonstrate improved dynamic standing balance as seen by ability to single leg stance on right LE > 12 sec consistently for optimal balance on level and unlevel surfaces.    Baseline 08/20/2021= 4 sec SLS on right    Time 12    Period Weeks    Status New    Target Date 11/12/21                   Plan - 09/19/21 1020     Clinical Impression Statement Patient motivated and participated fair within session. He reports increased low back pain today, therefore session limited to hooklying with core strengthening for better tolerance. He tolerated stretches well reporting less pain. He does exhibit increased tone/stiffness in RLE as a result of CVA. Patient does require min VCS for proper positioning/exercise technique. Progressed strengthening with increased repetitions. He would benefit from additional skilled PT intervention to improve strength and mobility while reducing back pain;    Personal Factors and Comorbidities Comorbidity 3+;Past/Current Experience;Time since onset of injury/illness/exacerbation    Comorbidities PMHx includes: Bilateral LBP with right sided sciatica, Lumbosacral spondylosis  myelopathy, closed compression Fx of L1. Has chronic insomnia. osteoarthritis, Panic anxiety syndrome (taking medication)    Examination-Activity Limitations Bend;Carry;Locomotion Level;Sit;Squat;Stairs;Stand;Transfers    Examination-Participation Restrictions Cleaning;Community Activity;Driving;Laundry;Meal Prep;Occupation;Shop;Volunteer;Yard Work    Stability/Clinical Decision Making Stable/Uncomplicated    Rehab Potential Good    PT Frequency 2x / week    PT Duration 12 weeks    PT Treatment/Interventions Cryotherapy;Electrical Stimulation;Moist Heat;Traction;Gait training;Stair training;Functional mobility training;Therapeutic activities;Therapeutic exercise;Balance training;Neuromuscular re-education;Patient/family education;Manual techniques;Passive range of motion;Dry needling;Energy conservation    PT Next Visit Plan Progress core stabilization, Manual therapy for low back and LE ROM    Consulted and Agree with Plan of Care Patient             Patient will benefit from skilled therapeutic intervention in order to improve the following deficits and impairments:  Abnormal gait, Decreased endurance, Decreased mobility, Difficulty walking, Increased muscle spasms, Decreased range of motion, Decreased activity tolerance, Decreased strength, Postural dysfunction, Pain  Visit Diagnosis: Apraxia  Muscle weakness (generalized)  Other lack of coordination  Abnormality of gait and mobility  Difficulty in walking, not elsewhere classified  Unsteadiness on feet  Chronic bilateral low back pain without sciatica     Problem List Patient Active Problem List   Diagnosis Date Noted   Alterations of sensations following cerebrovascular accident 03/08/2021   Aphasia as late effect of cerebrovascular accident (CVA) 03/08/2021   Muscle spasm 02/22/2021   Acute non-recurrent frontal sinusitis 02/22/2021   Chronic pain syndrome 01/23/2021   Primary hypertension 01/23/2021   Nerve pain  01/23/2021   Erectile dysfunction due to diseases classified elsewhere 01/23/2021   Cognitive dysfunction 01/23/2021   Scrotal edema 01/23/2021   Elevated LDL cholesterol level 01/23/2021   History of stroke with residual deficit 01/23/2021   Primary insomnia 01/23/2021   Mouth pain 01/23/2021   Laceration of right hand without foreign body    Closed compression fracture of L1 vertebra ([place]) 05/24/2016   Lumbar stenosis with neurogenic claudication 11/01/2015   Chronic 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 11/08/2014   Hx of hemorrhoids 11/08/2014   AAA (abdominal aortic aneurysm) ([place]) 09/22/2014   Chronic pain associated with significant psychosocial dysfunction 09/22/2014   Panic disorder 09/22/2014   AB (asthmatic bronchitis) 08/17/2014   Anxiety 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 08/17/2014   Backache 08/17/2014   Lumbosacral spondylosis without myelopathy  08/17/2014   Disorder of male genital organs 08/17/2014   Brash 08/17/2014   Low back pain 08/17/2014   Tendon nodule 08/17/2014   Episodic paroxysmal anxiety disorder 08/17/2014   Hernia, inguinal, right 08/17/2014   Fast heart beat 08/17/2014   Illness 08/17/2014   Inguinal hernia 10/14/2012    Sharilyn Geisinger, PT DPT 09/19/2021, 10:48 AM  [place], [place], [address] Phone: [phone]   Fax:  [phone]  Name: David Reeves MRN: [id number] Date of Birth: May 16, 1970

## 2021-09-24 ENCOUNTER — Ambulatory Visit: Payer: Medicare HMO | Admitting: Occupational Therapy

## 2021-09-24 ENCOUNTER — Ambulatory Visit: Payer: Medicare HMO | Attending: Physician Assistant | Admitting: Speech Pathology

## 2021-09-24 ENCOUNTER — Encounter: Payer: Self-pay | Admitting: Occupational Therapy

## 2021-09-24 DIAGNOSIS — M6281 Muscle weakness (generalized): Secondary | ICD-10-CM | POA: Diagnosis not present

## 2021-09-24 DIAGNOSIS — R482 Apraxia: Secondary | ICD-10-CM | POA: Diagnosis not present

## 2021-09-24 DIAGNOSIS — R278 Other lack of coordination: Secondary | ICD-10-CM | POA: Diagnosis not present

## 2021-09-24 DIAGNOSIS — M545 Low back pain, unspecified: Secondary | ICD-10-CM | POA: Diagnosis not present

## 2021-09-24 DIAGNOSIS — G8929 Other chronic pain: Secondary | ICD-10-CM | POA: Insufficient documentation

## 2021-09-24 DIAGNOSIS — F09 Unspecified mental disorder due to known physiological condition: Secondary | ICD-10-CM | POA: Diagnosis not present

## 2021-09-24 DIAGNOSIS — R2681 Unsteadiness on feet: Secondary | ICD-10-CM | POA: Diagnosis present

## 2021-09-24 DIAGNOSIS — R269 Unspecified abnormalities of gait and mobility: Secondary | ICD-10-CM | POA: Insufficient documentation

## 2021-09-24 DIAGNOSIS — R41841 Cognitive communication deficit: Secondary | ICD-10-CM | POA: Insufficient documentation

## 2021-09-24 DIAGNOSIS — R262 Difficulty in walking, not elsewhere classified: Secondary | ICD-10-CM | POA: Insufficient documentation

## 2021-09-24 DIAGNOSIS — Z8673 Personal history of transient ischemic attack (TIA), and cerebral infarction without residual deficits: Secondary | ICD-10-CM | POA: Insufficient documentation

## 2021-09-24 NOTE — Therapy (Signed)
[place] MAIN [facility] SERVICES [address] [place], [place], [address] Phone: [phone]   Fax:  [phone]  Occupational Therapy Treatment  Patient Details  Name: David Reeves MRN: [id number] Date of Birth: 02-01-1971 Referring Provider (OT): [name]   Encounter Date: 09/24/2021   OT End of Session - 09/24/21 1535     Visit Number 31    Number of Visits 44    Date for OT Re-Evaluation 11/01/21    Authorization Type Progress reporting period starting 08/10/21    OT Start Time 0922    OT Stop Time 1000    OT Time Calculation (min) 38 min    Activity Tolerance Patient tolerated treatment well    Behavior During Therapy [facility] for tasks assessed/performed             Past Medical History:  Diagnosis Date   Back pain 04/22/2012   Bone spur    Bulging disc 04/22/2012   Degenerative disc disease    Osteoarthritis    Panic anxiety syndrome    Stroke [facility]) 10/2020   Taking multiple medications for chronic disease     Past Surgical History:  Procedure Laterality Date   CYSTECTOMY     head   HERNIA REPAIR Left 2006,2014   [facility]   TOE SURGERY Right 2007    There were no vitals filed for this visit.   Subjective Assessment - 09/24/21 1534     Subjective  Pt reports he will have a surgical consult for his back on July 29.    Patient is accompanied by: Family member    Pertinent History Pt. is a 52 y.o. who was diagnosed with a CVA on July 21st, 2022. Pt. completed several weeks of  inpatient rehab at [facility]. After returning to home, Pt. sustained a fall in december of 2022, and was admitted to the hospital with COVI-19, and back pain from the fall. Since the most recent discharge, Pt. has been residing with the ex-wife until the Pt. is ready to return to independent living. Pt. PMHx includes: Bilateral LBP with right sided sciatica, Lumbosacral spondylosis myelopathy, closed compression Fx of L1. Pt. enjoys cooking, and  riding motorcycles.    Patient Stated Goals Pt would like to be as independent as he was before, improve hand function.    Currently in Pain? Yes    Pain Score 3     Pain Location Back    Pain Orientation Lower    Pain Descriptors / Indicators Aching;Sore    Pain Type Chronic pain    Pain Onset More than a month ago            OT TREATMENT    Neuro muscular re-education:  Pt. worked on grasping 1" circular pegs from a pegboard positioned both vertically, and horizontally at the tabletop.   Therapeutic exercise:  Pt. worked on reps on right hand finger, and thumb exercises at the tabletop. Pt. worked on digit extend with place, and hold with resistance,  and alternating thumb opposition to  the tip of the 2nd though 5th digits. Pt. worked on thumb flexion, and radial abduction.   Pt. reports 3/10 back pain. Pt. continues to present with increased back pain, and is wearing the back brace for extra support. Pt. Continues to make progress with right hand functioning. Pt. Is progressing with right hand digit extension, and is able to hold with resistance. Pt. was able to grasp the circular pegs, and transfer them  into a container. Pt. was initially able to grasp the pegs with the board positioned at a vertical angle, however required the board to be positioned flat at the tabletop after the first few reps.  Pt. Required reps of alternating weightbearing, and proprioceptive input through the right hand to normalize tone during the task.                       OT Education - 09/24/21 1535     Education Details hand exercises and [facility] skills    Person(s) Educated Patient    Methods Explanation;Demonstration;Tactile cues;Verbal cues    Comprehension Verbalized understanding;Returned demonstration;Verbal cues required;Tactile cues required;Need further instruction              OT Short Term Goals - 08/10/21 1330       OT SHORT TERM GOAL #1   Title Pt. wil  demonstrtae independence with HEPs    Baseline Eval: Pt. currently does not have one, 10th visit:  changes in HEP ongoing; 20th: instructed in putty exercises, changes ongoing    Time 6    Period Weeks    Status On-going    Target Date 09/20/21               OT Long Term Goals - 08/10/21 1331       OT LONG TERM GOAL #1   Title Pt. will improve RUE strength by 2 mm grades to assist with ADLs, and IADLs,    Baseline Eval: Right shoulder flexion, abduction 3/5, elbow flexion, extension wrist extension 3+/5, 10th visit: improving with RUE strength by not yet met goal; 20th visit: R shd flex/abd 4-, elbow flex/ext 4+, wrist flex 4-, wrist ext 4+    Time 12    Period Weeks    Status On-going    Target Date 11/01/21      OT LONG TERM GOAL #2   Title Pt. will improve right grip by 10 lbs to prepare for firmly holding objects for IADLs.    Baseline Eval: R: 38#, L: 124# (In 3rd dynamometer slot); R 35# in 4th slot, 28# in 3rd slot    Time 12    Period Weeks    Status On-going    Target Date 11/01/21      OT LONG TERM GOAL #3   Title Pt. will improve right lateral pinch strength by 5# to assist with cutting food    Baseline Eval: R: 17, L: 25; 20th: R: 18    Time 12    Period Weeks    Status On-going    Target Date 11/01/21      OT LONG TERM GOAL #4   Title Pt. will improve Right 3pt pinch by 2# to be able to hold/open items for cooking    Baseline Eval: R: Pt. unable to engage thumb L: 29#; 20th: R unable    Time 12    Period Weeks    Status On-going    Target Date 11/01/21      OT LONG TERM GOAL #5   Title Pt. will improve right hand [facility] skills to be able to independently manipulate buttons, and zippers.    Baseline Eval: Pt. has difficulty managing buttons,a nd zippers. 10th visit:  improving; 20th: unable to engage R hand effectively, performs 1 handed with L    Time 12    Period Weeks    Status On-going    Target Date 11/01/21  OT LONG TERM GOAL #6   Title Pt.  will independently write his name    Baseline Eval: Pt. is unable to hold a pen; 20th: pt can grip PenAgain with R hand and can make marks on paper with difficulty, but not yet writing name    Time 12    Period Weeks    Status On-going    Target Date 11/01/21      OT LONG TERM GOAL #7   Title Pt. will improve FOTO score by 2 points to reflect funational improvement    Baseline Eval: FOTO score 46 with TR score 52; 20th: 45    Time 12    Period Weeks    Status On-going    Target Date 11/01/21                   Plan - 09/24/21 1536     Clinical Impression Statement Pt. reports 3/10 back pain. Pt. continues to present with increased back pain, and is wearing the back brace for extra support. Pt. Continues to make progress with right hand functioning. Pt. Is progressing with right hand digit extension, and is able to hold with resistance. Pt. was able to grasp the circular pegs, and transfer them into a container. Pt. was initially able to grasp the pegs with the board positioned at a vertical angle, however required the board to be positioned flat at the tabletop after the first few reps.  Pt. Required reps of alternating weightbearing, and proprioceptive input through the right hand to normalize tone during the task.      OT Occupational Profile and History Detailed Assessment- Review of Records and additional review of physical, cognitive, psychosocial history related to current functional performance    Occupational performance deficits (Please refer to evaluation for details): ADL's;IADL's;Education    Rehab Potential Good    Clinical Decision Making Several treatment options, min-mod task modification necessary    Comorbidities Affecting Occupational Performance: May have comorbidities impacting occupational performance    Modification or Assistance to Complete Evaluation  Min-Moderate modification of tasks or assist with assess necessary to complete eval    OT Frequency 2x /  week    OT Duration 12 weeks    OT Treatment/Interventions Self-care/ADL training;DME and/or AE instruction;Therapeutic exercise;Ultrasound;Neuromuscular education;Therapeutic activities;Energy conservation;Moist Heat;Patient/family education;Splinting;Functional Mobility Training;Paraffin    Consulted and Agree with Plan of Care Patient             Patient will benefit from skilled therapeutic intervention in order to improve the following deficits and impairments:           Visit Diagnosis: Muscle weakness (generalized)  Other lack of coordination    Problem List Patient Active Problem List   Diagnosis Date Noted   Alterations of sensations following cerebrovascular accident 03/08/2021   Aphasia as late effect of cerebrovascular accident (CVA) 03/08/2021   Muscle spasm 02/22/2021   Acute non-recurrent frontal sinusitis 02/22/2021   Chronic pain syndrome 01/23/2021   Primary hypertension 01/23/2021   Nerve pain 01/23/2021   Erectile dysfunction due to diseases classified elsewhere 01/23/2021   Cognitive dysfunction 01/23/2021   Scrotal edema 01/23/2021   Elevated LDL cholesterol level 01/23/2021   History of stroke with residual deficit 01/23/2021   Primary insomnia 01/23/2021   Mouth pain 01/23/2021   Laceration of right hand without foreign body    Closed compression fracture of L1 vertebra ([place]) 05/24/2016   Lumbar stenosis with neurogenic claudication 11/01/2015   Chronic bilateral low back  pain with right-sided sciatica 11/08/2014   Hx of hemorrhoids 11/08/2014   AAA (abdominal aortic aneurysm) ([place]) 09/22/2014   Chronic pain associated with significant psychosocial dysfunction 09/22/2014   Panic disorder 09/22/2014   AB (asthmatic bronchitis) 08/17/2014   Anxiety 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 08/17/2014   Backache 08/17/2014   Lumbosacral spondylosis without myelopathy 08/17/2014   Disorder of male genital organs 08/17/2014   Brash 08/17/2014    Low back pain 08/17/2014   Tendon nodule 08/17/2014   Episodic paroxysmal anxiety disorder 08/17/2014   Hernia, inguinal, right 08/17/2014   Fast heart beat 08/17/2014   Illness 08/17/2014   Inguinal hernia 10/14/2012   Harrel Carina, MS, OTR/L   Harrel Carina, OT 09/24/2021, 3:38 PM  [place] MAIN [facility] SERVICES [address] [place], [place], [address] Phone: [phone]   Fax:  [phone]  Name: David Reeves MRN: [id number] Date of Birth: 21-Dec-1970

## 2021-09-26 ENCOUNTER — Ambulatory Visit: Payer: Medicare HMO | Admitting: Physical Therapy

## 2021-09-26 ENCOUNTER — Encounter: Payer: Self-pay | Admitting: Physician Assistant

## 2021-09-26 ENCOUNTER — Ambulatory Visit: Payer: Medicare HMO

## 2021-09-26 ENCOUNTER — Encounter: Payer: Self-pay | Admitting: Physical Therapy

## 2021-09-26 DIAGNOSIS — M545 Low back pain, unspecified: Secondary | ICD-10-CM | POA: Diagnosis not present

## 2021-09-26 DIAGNOSIS — R269 Unspecified abnormalities of gait and mobility: Secondary | ICD-10-CM | POA: Diagnosis not present

## 2021-09-26 DIAGNOSIS — R278 Other lack of coordination: Secondary | ICD-10-CM

## 2021-09-26 DIAGNOSIS — R262 Difficulty in walking, not elsewhere classified: Secondary | ICD-10-CM

## 2021-09-26 DIAGNOSIS — R41841 Cognitive communication deficit: Secondary | ICD-10-CM | POA: Diagnosis not present

## 2021-09-26 DIAGNOSIS — M6281 Muscle weakness (generalized): Secondary | ICD-10-CM

## 2021-09-26 DIAGNOSIS — R482 Apraxia: Secondary | ICD-10-CM

## 2021-09-26 DIAGNOSIS — G8929 Other chronic pain: Secondary | ICD-10-CM | POA: Diagnosis not present

## 2021-09-26 DIAGNOSIS — Z8673 Personal history of transient ischemic attack (TIA), and cerebral infarction without residual deficits: Secondary | ICD-10-CM | POA: Diagnosis not present

## 2021-09-26 DIAGNOSIS — F09 Unspecified mental disorder due to known physiological condition: Secondary | ICD-10-CM | POA: Diagnosis not present

## 2021-09-26 DIAGNOSIS — R2681 Unsteadiness on feet: Secondary | ICD-10-CM

## 2021-09-26 NOTE — Therapy (Signed)
[place] MAIN [facility] SERVICES [address] [place], [place], [address] Phone: [phone]   Fax:  [phone]  Occupational Therapy Treatment  Patient Details  Name: David Reeves MRN: [id number] Date of Birth: [birth year] Referring Provider (OT): [name]   Encounter Date: 09/26/2021   OT End of Session - 09/26/21 2108     Visit Number 32    Number of Visits 44    Date for OT Re-Evaluation 11/01/21    Authorization Type Progress reporting period starting 08/10/21    OT Start Time 0845    OT Stop Time 0930    OT Time Calculation (min) 45 min    Activity Tolerance Patient tolerated treatment well    Behavior During Therapy [facility] for tasks assessed/performed             Past Medical History:  Diagnosis Date   Back pain 04/22/2012   Bone spur    Bulging disc 04/22/2012   Degenerative disc disease    Osteoarthritis    Panic anxiety syndrome    Stroke [place]) 10/2020   Taking multiple medications for chronic disease     Past Surgical History:  Procedure Laterality Date   CYSTECTOMY     head   HERNIA REPAIR Left 2006,2014   [facility]   TOE SURGERY Right 2007    There were no vitals filed for this visit.   Subjective Assessment - 09/26/21 2106     Subjective  Pt reports doing better than average today.  States his pain in his back is better than normal and he had a good breakfast.    Patient is accompanied by: Family member    Pertinent History Pt. is a 51 y.o. who was diagnosed with a CVA on July 21st, 2022. Pt. completed several weeks of  inpatient rehab at [facility]. After returning to home, Pt. sustained a fall in december of 2022, and was admitted to the hospital with COVI-19, and back pain from the fall. Since the most recent discharge, Pt. has been residing with the ex-wife until the Pt. is ready to return to independent living. Pt. PMHx includes: Bilateral LBP with right sided sciatica, Lumbosacral spondylosis myelopathy,  closed compression Fx of L1. Pt. enjoys cooking, and riding motorcycles.    Patient Stated Goals Pt would like to be as independent as he was before, improve hand function.    Currently in Pain? Yes    Pain Score 3     Pain Location Back    Pain Orientation Lower    Pain Descriptors / Indicators Aching;Sore    Pain Type Chronic pain    Pain Radiating Towards low back    Pain Onset More than a month ago    Pain Frequency Constant    Aggravating Factors  bending/lifting/prolonged sitting    Pain Relieving Factors heat/walking    Effect of Pain on Daily Activities decreased activity tolerance    Multiple Pain Sites No           Occupational Therapy Treatment: Therapeutic Exercise: Performed digit abd/add x2 sets 10 reps each.  Performed active assisted thumb radial abduction, place and hold for same, and resisted lateral pinch with pt cued to push into therapist's hand.  OT assisted pt to maintain neutral wrist (blocked to reduce wrist extension) and to maintain digit flexion during lateral pinch in order to isolate thumb movements.  Performed active assisted R forearm pron/sup working to maintain grasp of 1lb item in hand x2 sets  10 reps each.  Performed active wrist ext against gravity, and wrist flexion gravity assisted with min resistance x 2 sets 10 reps each.   Neuro re-ed: Pt worked with picking up circular wooden blocks from tabletop using R hand with a 2 point and 3 point pinch grasp pattern.  Extra time and visual cues for digit extension in prep for grasping fingers around blocks, but overall less slipping of thumb around the block and pt was able to stack 3 blocks with increased time to complete.   Response to Treatment: Pt steadily improving with 2 point and 3 point grasp patterns, with noted more control/less thumb slipping when grasping circular blocks.  Pt continues to benefit from active assisted ROM at the thumb, wrist, and forearm movements for OT to assist with achieving  max range while reducing increased muscle tone.  Pt will continue to benefit from skilled OT for increasing distal strength and GMC/FMC skills in order to better engage RUE with daily tasks.         OT Education - 09/26/21 2108     Education Details hand exercises and [facility] skills    Person(s) Educated Patient    Methods Explanation;Demonstration;Tactile cues;Verbal cues    Comprehension Verbalized understanding;Returned demonstration;Verbal cues required;Tactile cues required;Need further instruction              OT Short Term Goals - 08/10/21 1330       OT SHORT TERM GOAL #1   Title Pt. wil demonstrtae independence with HEPs    Baseline Eval: Pt. currently does not have one, 10th visit:  changes in HEP ongoing; 20th: instructed in putty exercises, changes ongoing    Time 6    Period Weeks    Status On-going    Target Date 09/20/21               OT Long Term Goals - 08/10/21 1331       OT LONG TERM GOAL #1   Title Pt. will improve RUE strength by 2 mm grades to assist with ADLs, and IADLs,    Baseline Eval: Right shoulder flexion, abduction 3/5, elbow flexion, extension wrist extension 3+/5, 10th visit: improving with RUE strength by not yet met goal; 20th visit: R shd flex/abd 4-, elbow flex/ext 4+, wrist flex 4-, wrist ext 4+    Time 12    Period Weeks    Status On-going    Target Date 11/01/21      OT LONG TERM GOAL #2   Title Pt. will improve right grip by 10 lbs to prepare for firmly holding objects for IADLs.    Baseline Eval: R: 38#, L: 124# (In 3rd dynamometer slot); R 35# in 4th slot, 28# in 3rd slot    Time 12    Period Weeks    Status On-going    Target Date 11/01/21      OT LONG TERM GOAL #3   Title Pt. will improve right lateral pinch strength by 5# to assist with cutting food    Baseline Eval: R: 17, L: 25; 20th: R: 18    Time 12    Period Weeks    Status On-going    Target Date 11/01/21      OT LONG TERM GOAL #4   Title Pt. will improve  Right 3pt pinch by 2# to be able to hold/open items for cooking    Baseline Eval: R: Pt. unable to engage thumb L: 29#; 20th: R unable  Time 12    Period Weeks    Status On-going    Target Date 11/01/21      OT LONG TERM GOAL #5   Title Pt. will improve right hand [facility] skills to be able to independently manipulate buttons, and zippers.    Baseline Eval: Pt. has difficulty managing buttons,a nd zippers. 10th visit:  improving; 20th: unable to engage R hand effectively, performs 1 handed with L    Time 12    Period Weeks    Status On-going    Target Date 11/01/21      OT LONG TERM GOAL #6   Title Pt. will independently write his name    Baseline Eval: Pt. is unable to hold a pen; 20th: pt can grip PenAgain with R hand and can make marks on paper with difficulty, but not yet writing name    Time 12    Period Weeks    Status On-going    Target Date 11/01/21      OT LONG TERM GOAL #7   Title Pt. will improve FOTO score by 2 points to reflect funational improvement    Baseline Eval: FOTO score 46 with TR score 52; 20th: 45    Time 12    Period Weeks    Status On-going    Target Date 11/01/21             Plan - 09/26/21 2142     Clinical Impression Statement Pt steadily improving with 2 point and 3 point grasp patterns, with noted more control/less thumb slipping when grasping circular blocks.  Pt continues to benefit from active assisted ROM at the thumb, wrist, and forearm movements for OT to assist with achieving max range while reducing increased muscle tone.  Pt will continue to benefit from skilled OT for increasing distal strength and GMC/FMC skills in order to better engage RUE with daily tasks.    OT Occupational Profile and History Detailed Assessment- Review of Records and additional review of physical, cognitive, psychosocial history related to current functional performance    Occupational performance deficits (Please refer to evaluation for details):  ADL's;IADL's;Education    Rehab Potential Good    Clinical Decision Making Several treatment options, min-mod task modification necessary    Comorbidities Affecting Occupational Performance: May have comorbidities impacting occupational performance    Modification or Assistance to Complete Evaluation  Min-Moderate modification of tasks or assist with assess necessary to complete eval    OT Frequency 2x / week    OT Duration 12 weeks    OT Treatment/Interventions Self-care/ADL training;DME and/or AE instruction;Therapeutic exercise;Ultrasound;Neuromuscular education;Therapeutic activities;Energy conservation;Moist Heat;Patient/family education;Splinting;Functional Mobility Training;Paraffin    Consulted and Agree with Plan of Care Patient             Patient will benefit from skilled therapeutic intervention in order to improve the following deficits and impairments:           Visit Diagnosis: Muscle weakness (generalized)  Other lack of coordination    Problem List Patient Active Problem List   Diagnosis Date Noted   Alterations of sensations following cerebrovascular accident 03/08/2021   Aphasia as late effect of cerebrovascular accident (CVA) 03/08/2021   Muscle spasm 02/22/2021   Acute non-recurrent frontal sinusitis 02/22/2021   Chronic pain syndrome 01/23/2021   Primary hypertension 01/23/2021   Nerve pain 01/23/2021   Erectile dysfunction due to diseases classified elsewhere 01/23/2021   Cognitive dysfunction 01/23/2021   Scrotal edema 01/23/2021   Elevated LDL cholesterol  level 01/23/2021   History of stroke with residual deficit 01/23/2021   Primary insomnia 01/23/2021   Mouth pain 01/23/2021   Laceration of right hand without foreign body    Closed compression fracture of L1 vertebra ([place]) 05/24/2016   Lumbar stenosis with neurogenic claudication 11/01/2015   Chronic 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 11/08/2014   Hx of hemorrhoids 11/08/2014    AAA (abdominal aortic aneurysm) ([place]) 09/22/2014   Chronic pain associated with significant psychosocial dysfunction 09/22/2014   Panic disorder 09/22/2014   AB (asthmatic bronchitis) 08/17/2014   Anxiety 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 08/17/2014   Backache 08/17/2014   Lumbosacral spondylosis without myelopathy 08/17/2014   Disorder of male genital organs 08/17/2014   Brash 08/17/2014   Low back pain 08/17/2014   Tendon nodule 08/17/2014   Episodic paroxysmal anxiety disorder 08/17/2014   Hernia, inguinal, right 08/17/2014   Fast heart beat 08/17/2014   Illness 08/17/2014   Inguinal hernia 10/14/2012   Leta Speller, MS, OTR/L  Darleene Cleaver, OT 09/26/2021, 9:43 PM  [place] MAIN [facility] SERVICES [address] [place], [place], [address] Phone: [phone]   Fax:  [phone]  Name: David Reeves MRN: [id number] Date of Birth: [birth year]

## 2021-09-26 NOTE — Therapy (Signed)
[place] MAIN [facility] SERVICES [address] [place], [place], [address] Phone: [phone]   Fax:  [phone]  Speech Language Pathology Treatment  Patient Details  Name: David Reeves MRN: [id number] Date of Birth: [birth year] Referring Provider (SLP): [name]   Encounter Date: 09/24/2021   End of Session - 09/26/21 1058     Visit Number 23    Number of Visits 42    Date for SLP Re-Evaluation 11/20/21    Authorization Type [employer]    Authorization Time Period 08/27/2021 thru 11/20/2021    Authorization - Visit Number 3    Progress Note Due on Visit 10    SLP Start Time 0800    SLP Stop Time  0900    SLP Time Calculation (min) 60 min    Activity Tolerance Patient tolerated treatment well;No increased pain             Past Medical History:  Diagnosis Date   Back pain 04/22/2012   Bone spur    Bulging disc 04/22/2012   Degenerative disc disease    Osteoarthritis    Panic anxiety syndrome    Stroke [facility]) 10/2020   Taking multiple medications for chronic disease     Past Surgical History:  Procedure Laterality Date   CYSTECTOMY     head   HERNIA REPAIR Left 2006,2014   [facility]   TOE SURGERY Right 2007    There were no vitals filed for this visit.   Subjective Assessment - 09/26/21 1057     Subjective pt states he is having a better morning                   ADULT SLP TREATMENT - 09/26/21 0001       Treatment Provided   Treatment provided Cognitive-Linquistic      Cognitive-Linquistic Treatment   Treatment focused on Cognition;Patient/family/caregiver education    Skilled Treatment Skilled treatment session target pt's cognition goals.   SLP facilitated session by providing minimal assistance for recall for rules to previously learned card game, moderate assistance for selective attention.              SLP Education - 09/26/21 1057     Education Details improved task  tolerance    Person(s) Educated Patient    Methods Explanation;Demonstration;Verbal cues    Comprehension Verbalized understanding              SLP Short Term Goals - 09/07/21 0836       SLP SHORT TERM GOAL #1   Title Pt will use speech intelligibility strategies to slow rate of speech at the conversational level to achive ~ 60% speech intelligibility.    Baseline previous goal met, goal advanced to reflect progress    Period --   sessions   Status Revised      SLP SHORT TERM GOAL #2   Title Pt will produce fricatives in the initial, final and medial positions of words with 90% accuracy.    Status Deferred      SLP SHORT TERM GOAL #4   Title Pt will answer [place] questions after reading short story (5-6 sentences) with 50% accuracy given min to mod cues.    Time 10    Period --   sessions   Status On-going      SLP SHORT TERM GOAL #5   Title Pt will a) recall and b) demonstrate use of at least three external memory strategies.  Time 10    Period --   sessions   Status On-going              SLP Long Term Goals - 09/07/21 0837       SLP LONG TERM GOAL #1   Title Pt will use speech intelligibility strategies to achieve 60% speech intelligibility at the simple conversation level.    Baseline goal met, revised to reflect progress    Time 12    Period Weeks    Status Revised    Target Date 11/20/21      SLP LONG TERM GOAL #2   Title Patient will demonstrate improved cognitive linguistic function for IND completion of ADLs tasks in home/community environments    Target Date 11/20/21              Plan - 09/26/21 1058     Clinical Impression Statement Pt continues to demonstrate possible chronic cognitive communications that are worsened by lack of insight. Continue to recommend brief course of skilled ST intervention in the hopes of progressing pt's functional cognitive abilities to improve his functional independence.    Speech Therapy Frequency 2x / week     Duration 12 weeks    Treatment/Interventions Language facilitation;Compensatory techniques;Cueing hierarchy;Internal/external aids;SLP instruction and feedback;Patient/family education;Compensatory strategies;Functional tasks;Cognitive reorganization    Potential to Achieve Goals Good    Potential Considerations Pain level;Cooperation/participation level;Previous level of function;Family/community support;Severity of impairments             Patient will benefit from skilled therapeutic intervention in order to improve the following deficits and impairments:   Cognitive communication deficit  Cognitive dysfunction    Problem List Patient Active Problem List   Diagnosis Date Noted   Alterations of sensations following cerebrovascular accident 03/08/2021   Aphasia as late effect of cerebrovascular accident (CVA) 03/08/2021   Muscle spasm 02/22/2021   Acute non-recurrent frontal sinusitis 02/22/2021   Chronic pain syndrome 01/23/2021   Primary hypertension 01/23/2021   Nerve pain 01/23/2021   Erectile dysfunction due to diseases classified elsewhere 01/23/2021   Cognitive dysfunction 01/23/2021   Scrotal edema 01/23/2021   Elevated LDL cholesterol level 01/23/2021   History of stroke with residual deficit 01/23/2021   Primary insomnia 01/23/2021   Mouth pain 01/23/2021   Laceration of right hand without foreign body    Closed compression fracture of L1 vertebra ([place]) 05/24/2016   Lumbar stenosis with neurogenic claudication 11/01/2015   Chronic 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 11/08/2014   Hx of hemorrhoids 11/08/2014   AAA (abdominal aortic aneurysm) ([place]) 09/22/2014   Chronic pain associated with significant psychosocial dysfunction 09/22/2014   Panic disorder 09/22/2014   AB (asthmatic bronchitis) 08/17/2014   Anxiety 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 08/17/2014   Backache 08/17/2014   Lumbosacral spondylosis without myelopathy 08/17/2014    Disorder of male genital organs 08/17/2014   Brash 08/17/2014   Low back pain 08/17/2014   Tendon nodule 08/17/2014   Episodic paroxysmal anxiety disorder 08/17/2014   Hernia, inguinal, right 08/17/2014   Fast heart beat 08/17/2014   Illness 08/17/2014   Inguinal hernia 10/14/2012   Jessenia Filippone B. [place] Nail M.S., CCC-SLP, [place] Pathologist Certified Brain Injury [place]  [facility] [phone] Ascom [phone] Fax [phone]  Stormy Fabian, Layne Benton 09/26/2021, 10:59 AM  [place] [address] [place], [place], [address] Phone: [phone]   Fax:  [phone]   Name:  David Reeves MRN: [id number] Date of Birth: Dec 02, 1970

## 2021-09-26 NOTE — Therapy (Signed)
[place] MAIN [facility] SERVICES [address] [place], [place], [address] Phone: [phone]   Fax:  [phone]  Physical Therapy Treatment  Patient Details  Name: David Reeves MRN: [id number] Date of Birth: [birth year] Referring Provider (PT): [name] PA   Encounter Date: 09/26/2021   PT End of Session - 09/26/21 0953     Visit Number 24    Number of Visits 41    Date for PT Re-Evaluation 11/12/21    Authorization Type [employer]- authorized 16 visits 4/4-6/1    Authorization - Visit Number 5    Authorization - Number of Visits 16    Progress Note Due on Visit 20    PT Start Time 0933    PT Stop Time 1014    PT Time Calculation (min) 41 min    Equipment Utilized During Treatment Gait belt    Activity Tolerance Patient tolerated treatment well;No increased pain    Behavior During Therapy [facility] for tasks assessed/performed             Past Medical History:  Diagnosis Date   Back pain 04/22/2012   Bone spur    Bulging disc 04/22/2012   Degenerative disc disease    Osteoarthritis    Panic anxiety syndrome    Stroke [facility]) 10/2020   Taking multiple medications for chronic disease     Past Surgical History:  Procedure Laterality Date   CYSTECTOMY     head   HERNIA REPAIR Left 2006,2014   [facility]   TOE SURGERY Right 2007    There were no vitals filed for this visit.   Subjective Assessment - 09/26/21 0944     Subjective Pt reports his usual  back pain today. He states he isn't having the radicular symptoms today. He did see pain specialist who did double his valium. He has a follow up appt Aug 19 and then is supposed to see spinal surgeon end of July;    Pertinent History Pt. is a 51 y.o. who was diagnosed with a CVA on July 21st, 2022. Pt. completed several weeks of  inpatient rehab at [facility]. After returning to home. He was discharged in late August/early September and recieved home health PT. Pt. sustained a  fall in december of 2022, and was admitted to the hospital with COVID-19, and Chronic back pain. He reports chronic back pain syndrome since 2012. Since the most recent discharge, Pt. has been residing with the ex-wife until the Pt. is ready to return to independent living. He did recieve 2 weeks of home health after discharge in December 2022. He is now being referred to outpatient PT to address weakness from stroke and improve fine motor movement. He reports rarely getting numbness/tingling in RLE. PMHx includes: Bilateral LBP with right sided sciatica, Lumbosacral spondylosis myelopathy, closed compression Fx of L1. Has chronic insomnia. osteoarthritis, Panic anxiety syndrome (taking medication) Pt. enjoys cooking, and riding motorcycles. Patient is going to [facility] spine center for Epidural spinal injections on 06/07/21;    Limitations Sitting;Walking    How long can you sit comfortably? 20 min;    How long can you stand comfortably? 5-10 min;    How long can you walk comfortably? able to walk long ways (>500 feet)    Diagnostic tests MRI Nov 2022- Stable chronic burst L1 fracture, progressive disc degeneration with disc bulging  eccentric to the left at L3-4 and L4-5. spinal stenosis at L5-S1;    Patient Stated Goals "I Need  all the help  I can get. I want to move around better and reduce my back pain."    Currently in Pain? Yes    Pain Score 3     Pain Location Back    Pain Orientation Lower    Pain Descriptors / Indicators Aching;Sore    Pain Type Chronic pain    Pain Onset More than a month ago    Pain Frequency Constant    Aggravating Factors  bending/lifting/prolonged sitting    Pain Relieving Factors heat/walking    Effect of Pain on Daily Activities decreased activity tolerance;    Multiple Pain Sites No             TREATMENT: Patient presents with increased trigger points along right thoracic paraspinals.  He required min A to transition to prone position;  Janna Arch PT, DPT  performed dry needling:  Trigger Point Dry Needling (TDN), unbilled Education performed with patient regarding potential benefit of TDN. Reviewed precautions and risks with patient. Reviewed special precautions/risks over lung fields which include pneumothorax. Reviewed signs and symptoms of pneumothorax and advised pt to go to ER immediately if these symptoms develop advise them of dry needling treatment. Extensive time spent with pt to ensure full understanding of TDN risks. Pt provided verbal consent to treatment. TDN performed to with 0.3 x 30 single needle placements with local twitch response (LTR). Pistoning technique utilized. Improved pain-free motion following intervention. R thoracic paraspinals x 4 minutes   Patient lying in supine with moist heat to low back.   Legs apart: Windshield wiper trunk rotation stretch x5 reps each x2 sets; Hamstring stretch (BLE) x 30 sec x1 each leg.              Progressed to hamstring neural stretch with ankle DF/PF x10 reps each LE, moderate tightness noted on RLE;  Knee to chest (BLE) x20 sec x1 each leg Manual hip circles (CW/CCW) x 5 reps each direction and each LE LE crossed over contralateral LE for IT band stretch x5 sec hold x3 reps each LE;    Hooklying: Posterior pelvic tilt 5 sec hold x5 reps; Bridge x15 reps with cues for posterior pelvic tilt for better tolerance;     Hooklying with 3# ankle weight on each LE: -SLR hip flexion x15 reps -required min VCS for proper positioning, denies any increase in pain but does report fatigue especially in RLE;        Patient tolerated session well. He denies any increase in back pain.  He reports overall exercises are getting easier as he is getting stronger.                             PT Education - 09/26/21 0953     Education Details LE strengthening/positioning;    Person(s) Educated Patient    Methods Explanation;Verbal cues    Comprehension Verbalized  understanding;Returned demonstration;Verbal cues required;Need further instruction              PT Short Term Goals - 08/20/21 1001       PT SHORT TERM GOAL #1   Title Patient will be adherent to HEP at least 3x a week to improve functional strength and balance for better safety at home.    Baseline 4/4: doing them 1-2x a week; 08/20/2021=Patient reports performing his low back stretching and no questions currently HEP.    Time 4    Period Weeks  Status Achieved    Target Date 08/21/21      PT SHORT TERM GOAL #2   Title Patient (< 70 years old) will complete five times sit to stand test in < 15 seconds indicating an increased LE strength and improved balance.    Baseline 4/4: 17.85 sec with arms across chest; 08/20/2021= 17.6 sec    Time 4    Period Weeks    Status On-going    Target Date 10/01/21               PT Long Term Goals - 08/20/21 1001       PT LONG TERM GOAL #1   Title Patient will increase six minute walk test distance to >1000 for progression to community ambulator and improve gait ability    Baseline 4/4: 1115 feet    Time 4    Period Weeks    Status Achieved    Target Date 08/21/21      PT LONG TERM GOAL #2   Title Patient will ascend/descend 8 stairs without rail assist independently without loss of balance to improve ability to get in/out of home.    Baseline 4/4: requires 1 rail assist; 08/20/2021- Patient demonstrated steps without railing using reciprocal steps today.    Time 4    Period Weeks    Status Achieved    Target Date 08/21/21      PT LONG TERM GOAL #3   Title Patient will increase BLE gross strength to 4+/5 as to improve functional strength for independent gait, increased standing tolerance and increased ADL ability.    Baseline 4/4: not formally assessed; 08/20/2021= 4+/5 with Right hip flex/knee ext/flex; Right hip abd= 4/5    Time 12    Period Weeks    Status Partially Met    Target Date 11/12/21      PT LONG TERM GOAL #4    Title Patient will improve FOTO score to >50% to indicate improved functional mobility with less pain with ADLs.    Baseline 4/4: 35%; 08/20/2021= 42%    Time 12    Period Weeks    Status On-going    Target Date 11/12/21      PT LONG TERM GOAL #5   Title Patient will report a worst pain of 4/10 in low back over last week to indicate improved tolerance with ADLs.    Baseline 4/4: 7/10; 08/20/2021= 4/10    Time 12    Period Weeks    Status Partially Met    Target Date 11/12/21      Additional Long Term Goals   Additional Long Term Goals Yes      PT LONG TERM GOAL #6   Title Pt will increase 10MWT by at least 0.13 m/s in order to demonstrate clinically significant improvement in community ambulation    Baseline 08/20/2021=0.94 m/s    Time 12    Period Weeks    Status New    Target Date 11/12/21      PT LONG TERM GOAL #7   Title Patient will demonstrate improved dynamic standing balance as seen by ability to single leg stance on right LE > 12 sec consistently for optimal balance on level and unlevel surfaces.    Baseline 08/20/2021= 4 sec SLS on right    Time 12    Period Weeks    Status New    Target Date 11/12/21  Plan - 09/26/21 0953     Clinical Impression Statement patient motivated and participated well within session. He reports increased trigger points along right erector spinae muscles. Patient was treated by Janna Arch PT, DPT for dry needling to help alleviate trigger points. Patient tolerated well reporting less tightness. PT performed PROM/stretches for increased tissue extensibility. He responds well to lumbar flexion exercise with less tightness at end of session. patient does require min VCs for proper positioning/exercise technique. Patient would benefit from additional skilled PT Intervention to improve strength and mobility and reduce pain with ADLs.    Personal Factors and Comorbidities Comorbidity 3+;Past/Current Experience;Time since onset  of injury/illness/exacerbation    Comorbidities PMHx includes: Bilateral LBP with right sided sciatica, Lumbosacral spondylosis myelopathy, closed compression Fx of L1. Has chronic insomnia. osteoarthritis, Panic anxiety syndrome (taking medication)    Examination-Activity Limitations Bend;Carry;Locomotion Level;Sit;Squat;Stairs;Stand;Transfers    Examination-Participation Restrictions Cleaning;Community Activity;Driving;Laundry;Meal Prep;Occupation;Shop;Volunteer;Yard Work    Stability/Clinical Decision Making Stable/Uncomplicated    Rehab Potential Good    PT Frequency 2x / week    PT Duration 12 weeks    PT Treatment/Interventions Cryotherapy;Electrical Stimulation;Moist Heat;Traction;Gait training;Stair training;Functional mobility training;Therapeutic activities;Therapeutic exercise;Balance training;Neuromuscular re-education;Patient/family education;Manual techniques;Passive range of motion;Dry needling;Energy conservation    PT Next Visit Plan Progress core stabilization, Manual therapy for low back and LE ROM    Consulted and Agree with Plan of Care Patient             Patient will benefit from skilled therapeutic intervention in order to improve the following deficits and impairments:  Abnormal gait, Decreased endurance, Decreased mobility, Difficulty walking, Increased muscle spasms, Decreased range of motion, Decreased activity tolerance, Decreased strength, Postural dysfunction, Pain  Visit Diagnosis: Muscle weakness (generalized)  Other lack of coordination  Apraxia  Abnormality of gait and mobility  Difficulty in walking, not elsewhere classified  Unsteadiness on feet  Chronic bilateral low back pain without sciatica     Problem List Patient Active Problem List   Diagnosis Date Noted   Alterations of sensations following cerebrovascular accident 03/08/2021   Aphasia as late effect of cerebrovascular accident (CVA) 03/08/2021   Muscle spasm 02/22/2021   Acute  non-recurrent frontal sinusitis 02/22/2021   Chronic pain syndrome 01/23/2021   Primary hypertension 01/23/2021   Nerve pain 01/23/2021   Erectile dysfunction due to diseases classified elsewhere 01/23/2021   Cognitive dysfunction 01/23/2021   Scrotal edema 01/23/2021   Elevated LDL cholesterol level 01/23/2021   History of stroke with residual deficit 01/23/2021   Primary insomnia 01/23/2021   Mouth pain 01/23/2021   Laceration of right hand without foreign body    Closed compression fracture of L1 vertebra ([place]) 05/24/2016   Lumbar stenosis with neurogenic claudication 11/01/2015   Chronic 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 11/08/2014   Hx of hemorrhoids 11/08/2014   AAA (abdominal aortic aneurysm) ([place]) 09/22/2014   Chronic pain associated with significant psychosocial dysfunction 09/22/2014   Panic disorder 09/22/2014   AB (asthmatic bronchitis) 08/17/2014   Anxiety 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 08/17/2014   Backache 08/17/2014   Lumbosacral spondylosis without myelopathy 08/17/2014   Disorder of male genital organs 08/17/2014   Brash 08/17/2014   Low back pain 08/17/2014   Tendon nodule 08/17/2014   Episodic paroxysmal anxiety disorder 08/17/2014   Hernia, inguinal, right 08/17/2014   Fast heart beat 08/17/2014   Illness 08/17/2014   Inguinal hernia 10/14/2012    Sarahlynn Cisnero, PT, DPT 09/26/2021, 10:22 AM  [place]  MAIN [facility] SERVICES [place], [place], [address] Phone: [phone]   Fax:  [phone]  Name: Jawaun Celmer MRN: [id number] Date of Birth: [birth year]

## 2021-09-26 NOTE — Patient Instructions (Signed)
Use apps on his tablet

## 2021-09-27 ENCOUNTER — Ambulatory Visit: Payer: Medicare HMO | Admitting: Physical Therapy

## 2021-09-27 ENCOUNTER — Encounter: Payer: Medicare HMO | Admitting: Occupational Therapy

## 2021-10-01 ENCOUNTER — Ambulatory Visit: Payer: Medicare HMO | Admitting: Speech Pathology

## 2021-10-01 ENCOUNTER — Ambulatory Visit: Payer: Medicare HMO | Admitting: Occupational Therapy

## 2021-10-01 ENCOUNTER — Other Ambulatory Visit: Payer: Self-pay | Admitting: Physician Assistant

## 2021-10-01 ENCOUNTER — Telehealth: Payer: Self-pay | Admitting: Physician Assistant

## 2021-10-01 DIAGNOSIS — G8929 Other chronic pain: Secondary | ICD-10-CM

## 2021-10-01 DIAGNOSIS — I6930 Unspecified sequelae of cerebral infarction: Secondary | ICD-10-CM

## 2021-10-01 NOTE — Telephone Encounter (Signed)
[facility] faxed refill request for the following medications:  tiZANidine (ZANAFLEX) 4 MG tablet   Please advise.

## 2021-10-01 NOTE — Telephone Encounter (Signed)
Pt called back with pharmacy info, it is  [address] [place], [place], [place] [address]. [phone]

## 2021-10-02 ENCOUNTER — Other Ambulatory Visit: Payer: Self-pay | Admitting: Physician Assistant

## 2021-10-02 DIAGNOSIS — G8929 Other chronic pain: Secondary | ICD-10-CM

## 2021-10-02 DIAGNOSIS — I6930 Unspecified sequelae of cerebral infarction: Secondary | ICD-10-CM

## 2021-10-02 MED ORDER — TIZANIDINE HCL 4 MG PO TABS: 4.0000 mg | ORAL_TABLET | Freq: Four times a day (QID) | ORAL | 1 refills | Status: DC

## 2021-10-05 ENCOUNTER — Ambulatory Visit: Payer: Medicare HMO

## 2021-10-05 ENCOUNTER — Ambulatory Visit: Payer: Medicare HMO | Admitting: Speech Pathology

## 2021-10-05 DIAGNOSIS — M545 Low back pain, unspecified: Secondary | ICD-10-CM

## 2021-10-05 DIAGNOSIS — R278 Other lack of coordination: Secondary | ICD-10-CM | POA: Diagnosis not present

## 2021-10-05 DIAGNOSIS — R41841 Cognitive communication deficit: Secondary | ICD-10-CM

## 2021-10-05 DIAGNOSIS — M6281 Muscle weakness (generalized): Secondary | ICD-10-CM

## 2021-10-05 DIAGNOSIS — R269 Unspecified abnormalities of gait and mobility: Secondary | ICD-10-CM | POA: Diagnosis not present

## 2021-10-05 DIAGNOSIS — R2681 Unsteadiness on feet: Secondary | ICD-10-CM

## 2021-10-05 DIAGNOSIS — F09 Unspecified mental disorder due to known physiological condition: Secondary | ICD-10-CM

## 2021-10-05 DIAGNOSIS — Z8673 Personal history of transient ischemic attack (TIA), and cerebral infarction without residual deficits: Secondary | ICD-10-CM | POA: Diagnosis not present

## 2021-10-05 DIAGNOSIS — R262 Difficulty in walking, not elsewhere classified: Secondary | ICD-10-CM

## 2021-10-05 DIAGNOSIS — R482 Apraxia: Secondary | ICD-10-CM | POA: Diagnosis not present

## 2021-10-05 DIAGNOSIS — G8929 Other chronic pain: Secondary | ICD-10-CM | POA: Diagnosis not present

## 2021-10-05 NOTE — Therapy (Signed)
[place] MAIN [facility] SERVICES [address] [place], [place], [address] Phone: [phone]   Fax:  [phone]  Speech Language Pathology Treatment  Patient Details  Name: David Reeves MRN: [id number] Date of Birth: 1971/01/02 Referring Provider (SLP): [name]   Encounter Date: 10/05/2021   End of Session - 10/05/21 1003     Visit Number 24    Number of Visits 42    Date for SLP Re-Evaluation 11/20/21    Authorization Type [employer] H[employer]    Authorization Time Period 08/27/2021 thru 11/20/2021    Authorization - Visit Number 4    Progress Note Due on Visit 10    SLP Start Time 0800    SLP Stop Time  0915    SLP Time Calculation (min) 75 min    Activity Tolerance Patient tolerated treatment well;No increased pain             Past Medical History:  Diagnosis Date   Back pain 04/22/2012   Bone spur    Bulging disc 04/22/2012   Degenerative disc disease    Osteoarthritis    Panic anxiety syndrome    Stroke [facility]) 10/2020   Taking multiple medications for chronic disease     Past Surgical History:  Procedure Laterality Date   CYSTECTOMY     head   HERNIA REPAIR Left 2006,2014   [facility]   TOE SURGERY Right 2007    There were no vitals filed for this visit.   Subjective Assessment - 10/05/21 0815     Subjective pt crying in waiting area d/t pain, pt missed earlier session in the week d/t pain level, much more dysfluent    Currently in Pain? Yes    Pain Score 7     Pain Location Back    Pain Orientation Lower    Pain Descriptors / Indicators Aching;Sore    Pain Type Chronic pain    Pain Radiating Towards low back    Pain Onset More than a month ago    Pain Frequency Constant    Pain Relieving Factors heat, pillow    Effect of Pain on Daily Activities increased speech dysfluency    Multiple Pain Sites No                   ADULT SLP TREATMENT - 10/05/21 0001       Treatment Provided    Treatment provided Cognitive-Linquistic      Cognitive-Linquistic Treatment   Treatment focused on Cognition;Patient/family/caregiver education    Skilled Treatment Skilled treatment session target pt's cognition goals.    Constant Therapy Clinician App utilized:      To target pt's visuospatial organization and sequencing - Repeat a Pattern: Level 4 - 91% great improvement  Level 5: 25% improving to 50% with repetition of pattern; despite maximal multimodal assistance, pt unable to develop/utilize compensatory strategies, pt with very poor task tolerance of this activity - He refused to continue this task demonstrating poor insight into cognitive function being targeted and impact on ability to safely drive "it almost makes me irate"      Remember the right card:  Level 2: 80%  Level 3: 80%      Remember pictures in order (N-back)  Level 1: 60% improving to 88% with max faded to moderate assistance    Remember written words in order (N-Back)  Level 1: 100%    Read Map:  Level 1: 100%  Level 2: 100%  Level 3: 75%              SLP Education - 10/05/21 1003     Education Details cognitive functions involved with driving    Person(s) Educated Patient    Methods Explanation;Demonstration;Verbal cues    Comprehension Need further instruction              SLP Short Term Goals - 09/07/21 0836       SLP SHORT TERM GOAL #1   Title Pt will use speech intelligibility strategies to slow rate of speech at the conversational level to achive ~ 60% speech intelligibility.    Baseline previous goal met, goal advanced to reflect progress    Period --   sessions   Status Revised      SLP SHORT TERM GOAL #2   Title Pt will produce fricatives in the initial, final and medial positions of words with 90% accuracy.    Status Deferred      SLP SHORT TERM GOAL #4   Title Pt will answer Harris questions after reading short story (5-6 sentences) with 50% accuracy given min to mod cues.    Time 10     Period --   sessions   Status On-going      SLP SHORT TERM GOAL #5   Title Pt will a) recall and b) demonstrate use of at least three external memory strategies.    Time 10    Period --   sessions   Status On-going              SLP Long Term Goals - 09/07/21 0837       SLP LONG TERM GOAL #1   Title Pt will use speech intelligibility strategies to achieve 60% speech intelligibility at the simple conversation level.    Baseline goal met, revised to reflect progress    Time 12    Period Weeks    Status Revised    Target Date 11/20/21      SLP LONG TERM GOAL #2   Title Patient will demonstrate improved cognitive linguistic function for IND completion of ADLs tasks in home/community environments    Target Date 11/20/21              Plan - 10/05/21 1004     Clinical Impression Statement Pt continues to display decreased mental flexibility and demonstrates avoidance and anger related to difficult tasks. While he is making slow progress towards his goals, this is definitely a barrier to potential cognitive improvement. Continue to remind brief course of skilled ST intervention in the hopes of progressing pt's functional cognitive abilities to improve his functional independence.    Speech Therapy Frequency 2x / week    Duration 12 weeks    Treatment/Interventions Language facilitation;Compensatory techniques;Cueing hierarchy;Internal/external aids;SLP instruction and feedback;Patient/family education;Compensatory strategies;Functional tasks;Cognitive reorganization    Potential to Achieve Goals Fair    Potential Considerations Pain level;Cooperation/participation level;Previous level of function;Family/community support;Severity of impairments    Consulted and Agree with Plan of Care Patient             Patient will benefit from skilled therapeutic intervention in order to improve the following deficits and impairments:   Cognitive communication deficit  Cognitive  dysfunction    Problem List Patient Active Problem List   Diagnosis Date Noted   Alterations of sensations following cerebrovascular accident 03/08/2021   Aphasia as late effect of cerebrovascular accident (CVA) 03/08/2021   Muscle spasm 02/22/2021   Acute non-recurrent  frontal sinusitis 02/22/2021   Chronic pain syndrome 01/23/2021   Primary hypertension 01/23/2021   Nerve pain 01/23/2021   Erectile dysfunction due to diseases classified elsewhere 01/23/2021   Cognitive dysfunction 01/23/2021   Scrotal edema 01/23/2021   Elevated LDL cholesterol level 01/23/2021   History of stroke with residual deficit 01/23/2021   Primary insomnia 01/23/2021   Mouth pain 01/23/2021   Laceration of right hand without foreign body    Closed compression fracture of L1 vertebra ([place]) 05/24/2016   Lumbar stenosis with neurogenic claudication 11/01/2015   Chronic 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 11/08/2014   Hx of hemorrhoids 11/08/2014   AAA (abdominal aortic aneurysm) ([place]) 09/22/2014   Chronic pain associated with significant psychosocial dysfunction 09/22/2014   Panic disorder 09/22/2014   AB (asthmatic bronchitis) 08/17/2014   Anxiety 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 08/17/2014   Backache 08/17/2014   Lumbosacral spondylosis without myelopathy 08/17/2014   Disorder of male genital organs 08/17/2014   Brash 08/17/2014   Low back pain 08/17/2014   Tendon nodule 08/17/2014   Episodic paroxysmal anxiety disorder 08/17/2014   Hernia, inguinal, right 08/17/2014   Fast heart beat 08/17/2014   Illness 08/17/2014   Inguinal hernia 10/14/2012   David Battie B. Rutherford Nail M.S., CCC-SLP, [place] Pathologist Certified Brain Injury [place]  [facility] [phone] Ascom [phone] Fax [phone]  Clarisa Fling 10/05/2021, 10:04 AM  [place] [address] [place], [place], [address] Phone: [phone]   Fax:  [phone]   Name: David Reeves MRN: [id number] Date of Birth: March 24, 1971

## 2021-10-05 NOTE — Therapy (Signed)
[place] MAIN [facility] SERVICES [address] [place], [place], [address] Phone: [phone]   Fax:  [phone]  Physical Therapy Treatment  Patient Details  Name: David Reeves MRN: [id number] Date of Birth: 04-Mar-1971 Referring Provider (PT): [name] PA   Encounter Date: 10/05/2021   PT End of Session - 10/05/21 0839     Visit Number 25    Number of Visits 41    Date for PT Re-Evaluation 11/12/21    Authorization Type [employer]- authorized 16 visits 4/4-6/1    Authorization - Visit Number 5    Authorization - Number of Visits 16    Progress Note Due on Visit 30    PT Start Time 0959    PT Stop Time 1035    PT Time Calculation (min) 36 min    Equipment Utilized During Treatment Gait belt    Activity Tolerance Patient tolerated treatment well;No increased pain    Behavior During Therapy [facility] for tasks assessed/performed             Past Medical History:  Diagnosis Date   Back pain 04/22/2012   Bone spur    Bulging disc 04/22/2012   Degenerative disc disease    Osteoarthritis    Panic anxiety syndrome    Stroke [facility]) 10/2020   Taking multiple medications for chronic disease     Past Surgical History:  Procedure Laterality Date   CYSTECTOMY     head   HERNIA REPAIR Left 2006,2014   [facility]   TOE SURGERY Right 2007    There were no vitals filed for this visit.   Subjective Assessment - 10/05/21 0838     Subjective Patient reports going to see pain MD on 10/10/2021 due to overall increase pain. He reports he was sore for several days after dry needling with no reported relief.    Pertinent History Pt. is a 51 y.o. who was diagnosed with a CVA on July 21st, 2022. Pt. completed several weeks of  inpatient rehab at [facility]. After returning to home. He was discharged in late August/early September and recieved home health PT. Pt. sustained a fall in december of 2022, and was admitted to the hospital with COVID-19,  and Chronic back pain. He reports chronic back pain syndrome since 2012. Since the most recent discharge, Pt. has been residing with the ex-wife until the Pt. is ready to return to independent living. He did recieve 2 weeks of home health after discharge in December 2022. He is now being referred to outpatient PT to address weakness from stroke and improve fine motor movement. He reports rarely getting numbness/tingling in RLE. PMHx includes: Bilateral LBP with right sided sciatica, Lumbosacral spondylosis myelopathy, closed compression Fx of L1. Has chronic insomnia. osteoarthritis, Panic anxiety syndrome (taking medication) Pt. enjoys cooking, and riding motorcycles. Patient is going to [facility] spine center for Epidural spinal injections on 06/07/21;    Limitations Sitting;Walking    How long can you sit comfortably? 20 min;    How long can you stand comfortably? 5-10 min;    How long can you walk comfortably? able to walk long ways (>500 feet)    Diagnostic tests MRI Nov 2022- Stable chronic burst L1 fracture, progressive disc degeneration with disc bulging  eccentric to the left at L3-4 and L4-5. spinal stenosis at L5-S1;    Patient Stated Goals "I Need all the help  I can get. I want to move around better and reduce my back  pain."    Currently in Pain? Yes    Pain Score 4     Pain Orientation Lower    Pain Descriptors / Indicators Aching;Sore    Pain Type Chronic pain    Pain Onset More than a month ago    Pain Frequency Constant    Aggravating Factors  Bending/lifting/prolonged sitting    Pain Relieving Factors heat, pillow    Effect of Pain on Daily Activities limited mobility and increase speech dysfluency             INTERVENTIONS:      Manual Therapy: Supine (moist heat to low back)  - Supine Knee to chest x 30 sec x 4 sets each LE -Supine manual hip circles (CW/CCW) x 15 reps each - Lower trunk rotation x 20 sec hold x 4 each direction -Hamstring stretch (BLE) x 30 sec x3 each  leg.  - hamstring neural stretch with ankle DF/PF x10 reps each LE- had to decrease angle with right LE due to increase pain.  - Manual Long axis distraction RLE - hold 30 sec x 3  Patient reported some relief after session - pain down to a 3/10. He was able to walk without antalgia upon leaving clinic.   Education provided throughout session via VC/TC and demonstration to facilitate movement at target joints and correct muscle activation for all testing and exercises performed.   Rationale for Evaluation and Treatment Rehabilitation       .                 PT Education - 10/05/21 0838     Education Details Exercise technique    Person(s) Educated Patient    Methods Explanation;Demonstration;Tactile cues;Verbal cues    Comprehension Verbalized understanding;Returned demonstration;Verbal cues required;Tactile cues required;Need further instruction              PT Short Term Goals - 08/20/21 1001       PT SHORT TERM GOAL #1   Title Patient will be adherent to HEP at least 3x a week to improve functional strength and balance for better safety at home.    Baseline 4/4: doing them 1-2x a week; 08/20/2021=Patient reports performing his low back stretching and no questions currently HEP.    Time 4    Period Weeks    Status Achieved    Target Date 08/21/21      PT SHORT TERM GOAL #2   Title Patient (< 52 years old) will complete five times sit to stand test in < 15 seconds indicating an increased LE strength and improved balance.    Baseline 4/4: 17.85 sec with arms across chest; 08/20/2021= 17.6 sec    Time 4    Period Weeks    Status On-going    Target Date 10/01/21               PT Long Term Goals - 08/20/21 1001       PT LONG TERM GOAL #1   Title Patient will increase six minute walk test distance to >1000 for progression to community ambulator and improve gait ability    Baseline 4/4: 1115 feet    Time 4    Period Weeks    Status Achieved    Target  Date 08/21/21      PT LONG TERM GOAL #2   Title Patient will ascend/descend 8 stairs without rail assist independently without loss of balance to improve ability to get in/out of home.  Baseline 4/4: requires 1 rail assist; 08/20/2021- Patient demonstrated steps without railing using reciprocal steps today.    Time 4    Period Weeks    Status Achieved    Target Date 08/21/21      PT LONG TERM GOAL #3   Title Patient will increase BLE gross strength to 4+/5 as to improve functional strength for independent gait, increased standing tolerance and increased ADL ability.    Baseline 4/4: not formally assessed; 08/20/2021= 4+/5 with Right hip flex/knee ext/flex; Right hip abd= 4/5    Time 12    Period Weeks    Status Partially Met    Target Date 11/12/21      PT LONG TERM GOAL #4   Title Patient will improve FOTO score to >50% to indicate improved functional mobility with less pain with ADLs.    Baseline 4/4: 35%; 08/20/2021= 42%    Time 12    Period Weeks    Status On-going    Target Date 11/12/21      PT LONG TERM GOAL #5   Title Patient will report a worst pain of 4/10 in low back over last week to indicate improved tolerance with ADLs.    Baseline 4/4: 7/10; 08/20/2021= 4/10    Time 12    Period Weeks    Status Partially Met    Target Date 11/12/21      Additional Long Term Goals   Additional Long Term Goals Yes      PT LONG TERM GOAL #6   Title Pt will increase 10MWT by at least 0.13 m/s in order to demonstrate clinically significant improvement in community ambulation    Baseline 08/20/2021=0.94 m/s    Time 12    Period Weeks    Status New    Target Date 11/12/21      PT LONG TERM GOAL #7   Title Patient will demonstrate improved dynamic standing balance as seen by ability to single leg stance on right LE > 12 sec consistently for optimal balance on level and unlevel surfaces.    Baseline 08/20/2021= 4 sec SLS on right    Time 12    Period Weeks    Status New    Target Date  11/12/21                   Plan - 10/05/21 0839     Clinical Impression Statement Patient arrived and stated that he was able to get an appointment with pain clinic next week and stated his pain has been overall progressively worse with difficulty sleeping so treatment was focused on pain relief using modalities such as moist heat and manual therapy including Low back and LE stretching. He responded well to treatment and stated feeling much better after long progressive stretching and distraction today. Did not try to progress overall core stabilization due to improved report of pain and did not want to potentially worsen today. Patient would benefit from additional skilled PT Intervention to improve strength and mobility and reduce pain with ADLs    Personal Factors and Comorbidities Comorbidity 3+;Past/Current Experience;Time since onset of injury/illness/exacerbation    Comorbidities PMHx includes: Bilateral LBP with right sided sciatica, Lumbosacral spondylosis myelopathy, closed compression Fx of L1. Has chronic insomnia. osteoarthritis, Panic anxiety syndrome (taking medication)    Examination-Activity Limitations Bend;Carry;Locomotion Level;Sit;Squat;Stairs;Stand;Transfers    Examination-Participation Restrictions Cleaning;Community Activity;Driving;Laundry;Meal Prep;Occupation;Shop;Volunteer;Yard Work    Stability/Clinical Decision Making Stable/Uncomplicated    Rehab Potential Good    PT Frequency  2x / week    PT Duration 12 weeks    PT Treatment/Interventions Cryotherapy;Electrical Stimulation;Moist Heat;Traction;Gait training;Stair training;Functional mobility training;Therapeutic activities;Therapeutic exercise;Balance training;Neuromuscular re-education;Patient/family education;Manual techniques;Passive range of motion;Dry needling;Energy conservation    PT Next Visit Plan Progress core stabilization, Manual therapy for low back and LE ROM    Consulted and Agree with Plan of  Care Patient             Patient will benefit from skilled therapeutic intervention in order to improve the following deficits and impairments:  Abnormal gait, Decreased endurance, Decreased mobility, Difficulty walking, Increased muscle spasms, Decreased range of motion, Decreased activity tolerance, Decreased strength, Postural dysfunction, Pain  Visit Diagnosis: Muscle weakness (generalized)  Other lack of coordination  Abnormality of gait and mobility  Difficulty in walking, not elsewhere classified  Unsteadiness on feet  Chronic bilateral low back pain without sciatica     Problem List Patient Active Problem List   Diagnosis Date Noted   Alterations of sensations following cerebrovascular accident 03/08/2021   Aphasia as late effect of cerebrovascular accident (CVA) 03/08/2021   Muscle spasm 02/22/2021   Acute non-recurrent frontal sinusitis 02/22/2021   Chronic pain syndrome 01/23/2021   Primary hypertension 01/23/2021   Nerve pain 01/23/2021   Erectile dysfunction due to diseases classified elsewhere 01/23/2021   Cognitive dysfunction 01/23/2021   Scrotal edema 01/23/2021   Elevated LDL cholesterol level 01/23/2021   History of stroke with residual deficit 01/23/2021   Primary insomnia 01/23/2021   Mouth pain 01/23/2021   Laceration of right hand without foreign body    Closed compression fracture of L1 vertebra ([place]) 05/24/2016   Lumbar stenosis with neurogenic claudication 11/01/2015   Chronic 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 11/08/2014   Hx of hemorrhoids 11/08/2014   AAA (abdominal aortic aneurysm) ([place]) 09/22/2014   Chronic pain associated with significant psychosocial dysfunction 09/22/2014   Panic disorder 09/22/2014   AB (asthmatic bronchitis) 08/17/2014   Anxiety 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 08/17/2014   Backache 08/17/2014   Lumbosacral spondylosis without myelopathy 08/17/2014   Disorder of male genital organs  08/17/2014   Brash 08/17/2014   Low back pain 08/17/2014   Tendon nodule 08/17/2014   Episodic paroxysmal anxiety disorder 08/17/2014   Hernia, inguinal, right 08/17/2014   Fast heart beat 08/17/2014   Illness 08/17/2014   Inguinal hernia 10/14/2012    Lewis Moccasin, PT 10/05/2021, 11:05 AM  [place] [address] [place], [place], [address] Phone: [phone]   Fax:  [phone]  Name: Demani Mcbrien MRN: [id number] Date of Birth: 1970-12-05

## 2021-10-06 NOTE — Therapy (Signed)
[place] MAIN [facility] SERVICES [address] [place], [place], [address] Phone: [phone]   Fax:  [phone]  Occupational Therapy Treatment  Patient Details  Name: David Reeves MRN: [id number] Date of Birth: 1970-10-26 Referring Provider (OT): [name]   Encounter Date: 10/05/2021   OT End of Session - 10/06/21 1127     Visit Number 33    Number of Visits 44    Date for OT Re-Evaluation 11/01/21    Authorization Type Progress reporting period starting 08/10/21    OT Start Time 0915    OT Stop Time 1000    OT Time Calculation (min) 45 min    Activity Tolerance Patient tolerated treatment well    Behavior During Therapy [facility] for tasks assessed/performed             Past Medical History:  Diagnosis Date   Back pain 04/22/2012   Bone spur    Bulging disc 04/22/2012   Degenerative disc disease    Osteoarthritis    Panic anxiety syndrome    Stroke [facility]) 10/2020   Taking multiple medications for chronic disease     Past Surgical History:  Procedure Laterality Date   CYSTECTOMY     head   HERNIA REPAIR Left 2006,2014   [facility]   TOE SURGERY Right 2007    There were no vitals filed for this visit.   Subjective Assessment - 10/05/21 1126     Subjective  David Reeves states he's been using his weights a lot at home for his arm exercises.    Patient is accompanied by: Family member    Pertinent History David Reeves. is a 51 y.o. who was diagnosed with a CVA on July 21st, 2022. David Reeves. completed several weeks of  inpatient rehab at [facility]. After returning to home, David Reeves. sustained a fall in december of 2022, and was admitted to the hospital with COVI-19, and back pain from the fall. Since the most recent discharge, David Reeves. has been residing with the ex-wife until the David Reeves. is ready to return to independent living. David Reeves. PMHx includes: Bilateral LBP with right sided sciatica, Lumbosacral spondylosis myelopathy, closed compression Fx of L1. David Reeves. enjoys cooking,  and riding motorcycles.    Patient Stated Goals David Reeves would like to be as independent as he was before, improve hand function.    Currently in Pain? Yes    Pain Score 4     Pain Location Back    Pain Orientation Lower    Pain Descriptors / Indicators Aching;Sore    Pain Type Chronic pain    Pain Radiating Towards low back    Pain Onset More than a month ago    Pain Frequency Constant    Aggravating Factors  bending/lifting/prolonged sitting    Pain Relieving Factors heat, pillow    Effect of Pain on Daily Activities poor sitting tolerance    Multiple Pain Sites No            Occupational Therapy Treatment: Therapeutic Exercise: Performed digit abd/add x2 sets 10 reps each.  Performed active assisted thumb radial abduction, place and hold for same, and resisted lateral pinch with David Reeves cued to push into therapist's hand.  OT assisted David Reeves to maintain neutral wrist (blocked to reduce wrist extension) and to maintain digit flexion during lateral pinch in order to isolate thumb movements.  Performed active assisted R forearm pron/sup working to maintain grasp of 1lb item in hand x2 sets 10 reps each.  Performed active wrist  ext against gravity, and wrist flexion gravity assisted with min resistance x 2 sets 10 reps each.    Neuro re-ed: David Reeves worked with picking up circular wooden blocks from tabletop using R hand with a 2 point and 3 point pinch grasp pattern.  Extra time and visual cues for digit extension in prep for grasping fingers around blocks.  Increased slipping of thumb today around the block.   Response to Treatment: David Reeves with less consistency with 2 point and 3 point grasp patterns when working to pick up circular blocks today.  Increased slipping of thumb when grasping circular blocks. David Reeves continues to benefit from active assisted ROM at the thumb, wrist, and forearm with OT assisting to achieve max range while reducing increased muscle tone.  David Reeves will continue to benefit from skilled OT for  increasing distal strength and GMC/FMC skills in order to better engage RUE with daily tasks.       OT Education - 10/05/21 1127     Education Details hand exercises and [facility] skills    Person(s) Educated Patient    Methods Explanation;Demonstration;Tactile cues;Verbal cues    Comprehension Verbalized understanding;Returned demonstration;Verbal cues required;Tactile cues required;Need further instruction              OT Short Term Goals - 08/10/21 1330       OT SHORT TERM GOAL #1   Title David Reeves. wil demonstrtae independence with HEPs    Baseline Eval: David Reeves. currently does not have one, 10th visit:  changes in HEP ongoing; 20th: instructed in putty exercises, changes ongoing    Time 6    Period Weeks    Status On-going    Target Date 09/20/21               OT Long Term Goals - 08/10/21 1331       OT LONG TERM GOAL #1   Title David Reeves. will improve RUE strength by 2 mm grades to assist with ADLs, and IADLs,    Baseline Eval: Right shoulder flexion, abduction 3/5, elbow flexion, extension wrist extension 3+/5, 10th visit: improving with RUE strength by not yet met goal; 20th visit: R shd flex/abd 4-, elbow flex/ext 4+, wrist flex 4-, wrist ext 4+    Time 12    Period Weeks    Status On-going    Target Date 11/01/21      OT LONG TERM GOAL #2   Title David Reeves. will improve right grip by 10 lbs to prepare for firmly holding objects for IADLs.    Baseline Eval: R: 38#, L: 124# (In 3rd dynamometer slot); R 35# in 4th slot, 28# in 3rd slot    Time 12    Period Weeks    Status On-going    Target Date 11/01/21      OT LONG TERM GOAL #3   Title David Reeves. will improve right lateral pinch strength by 5# to assist with cutting food    Baseline Eval: R: 17, L: 25; 20th: R: 18    Time 12    Period Weeks    Status On-going    Target Date 11/01/21      OT LONG TERM GOAL #4   Title David Reeves. will improve Right 3pt pinch by 2# to be able to hold/open items for cooking    Baseline Eval: R: David Reeves. unable to  engage thumb L: 29#; 20th: R unable    Time 12    Period Weeks    Status On-going    Target  Date 11/01/21      OT LONG TERM GOAL #5   Title David Reeves. will improve right hand [facility] skills to be able to independently manipulate buttons, and zippers.    Baseline Eval: David Reeves. has difficulty managing buttons,a nd zippers. 10th visit:  improving; 20th: unable to engage R hand effectively, performs 1 handed with L    Time 12    Period Weeks    Status On-going    Target Date 11/01/21      OT LONG TERM GOAL #6   Title David Reeves. will independently write his name    Baseline Eval: David Reeves. is unable to hold a pen; 20th: David Reeves can grip PenAgain with R hand and can make marks on paper with difficulty, but not yet writing name    Time 12    Period Weeks    Status On-going    Target Date 11/01/21      OT LONG TERM GOAL #7   Title David Reeves. will improve FOTO score by 2 points to reflect funational improvement    Baseline Eval: FOTO score 46 with TR score 52; 20th: 45    Time 12    Period Weeks    Status On-going    Target Date 11/01/21                   Plan - 10/05/21 1133     Clinical Impression Statement David Reeves with less consistency with 2 point and 3 point grasp patterns when working to pick up circular blocks today.  Increased slipping of thumb when grasping circular blocks. David Reeves continues to benefit from active assisted ROM at the thumb, wrist, and forearm with OT assisting to achieve max range while reducing increased muscle tone.  David Reeves will continue to benefit from skilled OT for increasing distal strength and GMC/FMC skills in order to better engage RUE with daily tasks.    OT Occupational Profile and History Detailed Assessment- Review of Records and additional review of physical, cognitive, psychosocial history related to current functional performance    Occupational performance deficits (Please refer to evaluation for details): ADL's;IADL's;Education    Rehab Potential Good    Clinical Decision Making Several  treatment options, min-mod task modification necessary    Comorbidities Affecting Occupational Performance: May have comorbidities impacting occupational performance    Modification or Assistance to Complete Evaluation  Min-Moderate modification of tasks or assist with assess necessary to complete eval    OT Frequency 2x / week    OT Duration 12 weeks    OT Treatment/Interventions Self-care/ADL training;DME and/or AE instruction;Therapeutic exercise;Ultrasound;Neuromuscular education;Therapeutic activities;Energy conservation;Moist Heat;Patient/family education;Splinting;Functional Mobility Training;Paraffin    Consulted and Agree with Plan of Care Patient             Patient will benefit from skilled therapeutic intervention in order to improve the following deficits and impairments:           Visit Diagnosis: Muscle weakness (generalized)  Other lack of coordination    Problem List Patient Active Problem List   Diagnosis Date Noted   Alterations of sensations following cerebrovascular accident 03/08/2021   Aphasia as late effect of cerebrovascular accident (CVA) 03/08/2021   Muscle spasm 02/22/2021   Acute non-recurrent frontal sinusitis 02/22/2021   Chronic pain syndrome 01/23/2021   Primary hypertension 01/23/2021   Nerve pain 01/23/2021   Erectile dysfunction due to diseases classified elsewhere 01/23/2021   Cognitive dysfunction 01/23/2021   Scrotal edema 01/23/2021   Elevated LDL cholesterol level 01/23/2021   History  of stroke with residual deficit 01/23/2021   Primary insomnia 01/23/2021   Mouth pain 01/23/2021   Laceration of right hand without foreign body    Closed compression fracture of L1 vertebra ([place]) 05/24/2016   Lumbar stenosis with neurogenic claudication 11/01/2015   Chronic 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 11/08/2014   Hx of hemorrhoids 11/08/2014   AAA (abdominal aortic aneurysm) ([place]) 09/22/2014   Chronic pain associated with  significant psychosocial dysfunction 09/22/2014   Panic disorder 09/22/2014   AB (asthmatic bronchitis) 08/17/2014   Anxiety 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 08/17/2014   Backache 08/17/2014   Lumbosacral spondylosis without myelopathy 08/17/2014   Disorder of male genital organs 08/17/2014   Brash 08/17/2014   Low back pain 08/17/2014   Tendon nodule 08/17/2014   Episodic paroxysmal anxiety disorder 08/17/2014   Hernia, inguinal, right 08/17/2014   Fast heart beat 08/17/2014   Illness 08/17/2014   Inguinal hernia 10/14/2012   Leta Speller, MS, OTR/L  Darleene Cleaver, OT 10/06/2021, 11:34 AM  [place] MAIN [facility] SERVICES [address] [place], [place], [address] Phone: [phone]   Fax:  [phone]  Name: Ryu Cerreta MRN: [id number] Date of Birth: 12-03-1970

## 2021-10-09 ENCOUNTER — Ambulatory Visit: Payer: Medicare HMO | Admitting: Speech Pathology

## 2021-10-09 ENCOUNTER — Ambulatory Visit: Payer: Medicare HMO

## 2021-10-09 DIAGNOSIS — G8929 Other chronic pain: Secondary | ICD-10-CM

## 2021-10-09 DIAGNOSIS — R41841 Cognitive communication deficit: Secondary | ICD-10-CM

## 2021-10-09 DIAGNOSIS — F09 Unspecified mental disorder due to known physiological condition: Secondary | ICD-10-CM

## 2021-10-09 DIAGNOSIS — M6281 Muscle weakness (generalized): Secondary | ICD-10-CM

## 2021-10-09 DIAGNOSIS — R269 Unspecified abnormalities of gait and mobility: Secondary | ICD-10-CM | POA: Diagnosis not present

## 2021-10-09 DIAGNOSIS — R278 Other lack of coordination: Secondary | ICD-10-CM

## 2021-10-09 DIAGNOSIS — R482 Apraxia: Secondary | ICD-10-CM | POA: Diagnosis not present

## 2021-10-09 DIAGNOSIS — M545 Low back pain, unspecified: Secondary | ICD-10-CM | POA: Diagnosis not present

## 2021-10-09 DIAGNOSIS — Z8673 Personal history of transient ischemic attack (TIA), and cerebral infarction without residual deficits: Secondary | ICD-10-CM | POA: Diagnosis not present

## 2021-10-09 NOTE — Therapy (Signed)
OUTPATIENT OCCUPATIONAL THERAPY TREATMENT NOTE   Patient Name: David Reeves MRN: [id number] DOB:Feb 02, 1971, 51 y.o., male Today's Date: 10/09/2021  PCP: [name], PA-C REFERRING PROVIDER: [name], PA-C  END OF SESSION:   OT End of Session - 10/09/21 0910     Visit Number 34    Number of Visits 81    Date for OT Re-Evaluation 11/01/21    Authorization Type Progress reporting period starting 08/10/21    OT Start Time 0905    OT Stop Time 0950    OT Time Calculation (min) 45 min    Activity Tolerance Patient tolerated treatment well    Behavior During Therapy [facility] for tasks assessed/performed             Past Medical History:  Diagnosis Date   Back pain 04/22/2012   Bone spur    Bulging disc 04/22/2012   Degenerative disc disease    Osteoarthritis    Panic anxiety syndrome    Stroke ([place]) 10/2020   Taking multiple medications for chronic disease    Past Surgical History:  Procedure Laterality Date   CYSTECTOMY     head   HERNIA REPAIR Left 2006,2014   [facility]   TOE SURGERY Right 2007   Patient Active Problem List   Diagnosis Date Noted   Alterations of sensations following cerebrovascular accident 03/08/2021   Aphasia as late effect of cerebrovascular accident (CVA) 03/08/2021   Muscle spasm 02/22/2021   Acute non-recurrent frontal sinusitis 02/22/2021   Chronic pain syndrome 01/23/2021   Primary hypertension 01/23/2021   Nerve pain 01/23/2021   Erectile dysfunction due to diseases classified elsewhere 01/23/2021   Cognitive dysfunction 01/23/2021   Scrotal edema 01/23/2021   Elevated LDL cholesterol level 01/23/2021   History of stroke with residual deficit 01/23/2021   Primary insomnia 01/23/2021   Mouth pain 01/23/2021   Laceration of right hand without foreign body    Closed compression fracture of L1 vertebra ([place]) 05/24/2016   Lumbar stenosis with neurogenic claudication 11/01/2015   Chronic bilateral low back pain with right-sided  sciatica 11/08/2014   Hx of hemorrhoids 11/08/2014   AAA (abdominal aortic aneurysm) ([place]) 09/22/2014   Chronic pain associated with significant psychosocial dysfunction 09/22/2014   Panic disorder 09/22/2014   AB (asthmatic bronchitis) 08/17/2014   Anxiety 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 08/17/2014   Backache 08/17/2014   Lumbosacral spondylosis without myelopathy 08/17/2014   Disorder of male genital organs 08/17/2014   Brash 08/17/2014   Low back pain 08/17/2014   Tendon nodule 08/17/2014   Episodic paroxysmal anxiety disorder 08/17/2014   Hernia, inguinal, right 08/17/2014   Fast heart beat 08/17/2014   Illness 08/17/2014   Inguinal hernia 10/14/2012    ONSET DATE: 11/09/2020  REFERRING DIAG: CVA  THERAPY DIAG:  Muscle weakness (generalized)  Other lack of coordination  Rationale for Evaluation and Treatment Rehabilitation  PERTINENT HISTORY: David Reeves. is a 51 y.o. who was diagnosed with a CVA on July 21st, 2022. David Reeves. completed several weeks of  inpatient rehab at [facility]. After returning to home, David Reeves. sustained a fall in december of 2022, and was admitted to the hospital with COVI-19, and back pain from the fall. Since the most recent discharge, David Reeves. has been residing with the ex-wife until the David Reeves. is ready to return to independent living. David Reeves. PMHx includes: Bilateral LBP with right sided sciatica, Lumbosacral spondylosis myelopathy, closed compression Fx of L1. David Reeves. enjoys cooking, and riding motorcycles.   PRECAUTIONS: Fall  SUBJECTIVE: "I  go to the pain doctor today at 1p."  PAIN:  Are you having pain? Yes: NPRS scale: 4/10 Pain location: low back Pain description: aching Aggravating factors: prolonged sitting or lying down Relieving factors: rest, heat, walking      OBJECTIVE:  TODAY'S TREATMENT:   10/09/21 Therapeutic Exercise: Performed R wrist ext with 1# weight against gravity, wrist flexion gravity assisted working to maintain grasp of 1# weight, and active  assisted forearm pron/sup working to maintain grasp of 1# weight with supination x3 sets 10 reps each.  With R forearm supinated, worked R wrist flexion against gravity with place and hold x2 sets 10 reps each.  Performed resisted lateral pinch with red clothespin x3 sets 10 reps, OT assist to set up clip in hand to initiate pinching.     Neuro re-ed: David Reeves worked with picking up circular wooden blocks from tabletop and stacking blocks using R hand with a 2 point and 3 point pinch grasp pattern.  Extra time and visual cues for digit extension in prep for grasping fingers around blocks.     PATIENT EDUCATION: Education details: HEP progression Person educated: Patient Education method: Explanation Education comprehension: verbalized understanding and needs further education    OT Short Term Goals -      OT SHORT TERM GOAL #1   Title David Reeves. wil demonstrtae independence with HEPs    Baseline Eval: David Reeves. currently does not have one, 10th visit:  changes in HEP ongoing; 20th: instructed in putty exercises, changes ongoing    Time 6    Period Weeks    Status On-going    Target Date 09/20/21             OT Long Term Goals -       OT LONG TERM GOAL #1   Title David Reeves. will improve RUE strength by 2 mm grades to assist with ADLs, and IADLs,    Baseline Eval: Right shoulder flexion, abduction 3/5, elbow flexion, extension wrist extension 3+/5, 10th visit: improving with RUE strength by not yet met goal; 20th visit: R shd flex/abd 4-, elbow flex/ext 4+, wrist flex 4-, wrist ext 4+    Time 12    Period Weeks    Status On-going    Target Date 11/01/21      OT LONG TERM GOAL #2   Title David Reeves. will improve right grip by 10 lbs to prepare for firmly holding objects for IADLs.    Baseline Eval: R: 38#, L: 124# (In 3rd dynamometer slot); R 35# in 4th slot, 28# in 3rd slot    Time 12    Period Weeks    Status On-going    Target Date 11/01/21      OT LONG TERM GOAL #3   Title David Reeves. will improve right  lateral pinch strength by 5# to assist with cutting food    Baseline Eval: R: 17, L: 25; 20th: R: 18    Time 12    Period Weeks    Status On-going    Target Date 11/01/21      OT LONG TERM GOAL #4   Title David Reeves. will improve Right 3pt pinch by 2# to be able to hold/open items for cooking    Baseline Eval: R: David Reeves. unable to engage thumb L: 29#; 20th: R unable    Time 12    Period Weeks    Status On-going    Target Date 11/01/21      OT LONG TERM GOAL #5  Title David Reeves. will improve right hand [facility] skills to be able to independently manipulate buttons, and zippers.    Baseline Eval: David Reeves. has difficulty managing buttons,a nd zippers. 10th visit:  improving; 20th: unable to engage R hand effectively, performs 1 handed with L    Time 12    Period Weeks    Status On-going    Target Date 11/01/21      OT LONG TERM GOAL #6   Title David Reeves. will independently write his name    Baseline Eval: David Reeves. is unable to hold a pen; 20th: David Reeves can grip PenAgain with R hand and can make marks on paper with difficulty, but not yet writing name    Time 12    Period Weeks    Status On-going    Target Date 11/01/21      OT LONG TERM GOAL #7   Title David Reeves. will improve FOTO score by 2 points to reflect funational improvement    Baseline Eval: FOTO score 46 with TR score 52; 20th: 45    Time 12    Period Weeks    Status On-going    Target Date 11/01/21              Plan - 10/09/21 1503     Clinical Impression Statement Extensor tone in the RUE continues to limit David Reeves's ability to flex wrist with arm supinated, though improving flexion with arm pronated.  David Reeves with improved consistency today to pick up circular blocks from table without R thumb slipping from position.  David Reeves was able to stack 3 blocks x2 trials with R hand.  David Reeves requires frequent rest breaks with RUE exercises.  David Reeves will continue to benefit from skilled OT for increasing distal strength and GMC/FMC skills in order to better engage RUE with daily tasks.    OT  Occupational Profile and History Detailed Assessment- Review of Records and additional review of physical, cognitive, psychosocial history related to current functional performance    Occupational performance deficits (Please refer to evaluation for details): ADL's;IADL's;Education    Rehab Potential Good    Clinical Decision Making Several treatment options, min-mod task modification necessary    Comorbidities Affecting Occupational Performance: May have comorbidities impacting occupational performance    Modification or Assistance to Complete Evaluation  Min-Moderate modification of tasks or assist with assess necessary to complete eval    OT Frequency 2x / week    OT Duration 12 weeks    OT Treatment/Interventions Self-care/ADL training;DME and/or AE instruction;Therapeutic exercise;Ultrasound;Neuromuscular education;Therapeutic activities;Energy conservation;Moist Heat;Patient/family education;Splinting;Functional Mobility Training;Paraffin    Plan Neuro re-ed and therapeutic exercise    Consulted and Agree with Plan of Care Patient                 Leta Speller, MS, OTR/L  Darleene Cleaver, OT 10/09/2021, 12:36 PM

## 2021-10-09 NOTE — Therapy (Signed)
[place] [facility] SERVICES [address] [place], [place], [address] Phone: [phone]   Fax:  [phone]  Physical Therapy Treatment  Patient Details  Name: David Reeves MRN: [id number] Date of Birth: [birth year] Referring Provider (PT): [name] PA   Encounter Date: 10/09/2021   PT End of Session - 10/09/21 1102     Visit Number 26    Number of Visits 41    Date for PT Re-Evaluation 11/12/21    Authorization Type [employer]- authorized 16 visits 4/4-6/1    Authorization - Visit Number 5    Authorization - Number of Visits 16    Progress Note Due on Visit 30    PT Start Time 1012    PT Stop Time 1055    PT Time Calculation (min) 43 min    Equipment Utilized During Treatment Gait belt    Activity Tolerance Patient tolerated treatment well;No increased pain    Behavior During Therapy [facility] for tasks assessed/performed             Past Medical History:  Diagnosis Date   Back pain 04/22/2012   Bone spur    Bulging disc 04/22/2012   Degenerative disc disease    Osteoarthritis    Panic anxiety syndrome    Stroke [facility]) 10/2020   Taking multiple medications for chronic disease     Past Surgical History:  Procedure Laterality Date   CYSTECTOMY     head   HERNIA REPAIR Left 2006,2014   [facility]   TOE SURGERY Right 2007    There were no vitals filed for this visit.   Subjective Assessment - 10/09/21 0956     Subjective Pt reports 4/10 back pain currently. Pt has appointment with pain doctor today at 130 pm.    Pertinent History Pt. is a 51 y.o. who was diagnosed with a CVA on July 21st, 2022. Pt. completed several weeks of  inpatient rehab at [facility]. After returning to home. He was discharged in late August/early September and recieved home health PT. Pt. sustained a fall in december of 2022, and was admitted to the hospital with COVID-19, and Chronic back pain. He reports chronic back pain syndrome since 2012.  Since the most recent discharge, Pt. has been residing with the ex-wife until the Pt. is ready to return to independent living. He did recieve 2 weeks of home health after discharge in December 2022. He is now being referred to outpatient PT to address weakness from stroke and improve fine motor movement. He reports rarely getting numbness/tingling in RLE. PMHx includes: Bilateral LBP with right sided sciatica, Lumbosacral spondylosis myelopathy, closed compression Fx of L1. Has chronic insomnia. osteoarthritis, Panic anxiety syndrome (taking medication) Pt. enjoys cooking, and riding motorcycles. Patient is going to [facility] spine center for Epidural spinal injections on 06/07/21;    Limitations Sitting;Walking    How long can you sit comfortably? 20 min;    How long can you stand comfortably? 5-10 min;    How long can you walk comfortably? able to walk long ways (>500 feet)    Diagnostic tests MRI Nov 2022- Stable chronic burst L1 fracture, progressive disc degeneration with disc bulging  eccentric to the left at L3-4 and L4-5. spinal stenosis at L5-S1;    Patient Stated Goals "I Need all the help  I can get. I want to move around better and reduce my back pain."    Currently in Pain? Yes    Pain Score  4     Pain Location Back    Pain Orientation Lower    Pain Type Chronic pain    Pain Onset More than a month ago            Interventions  Pt supine on plinth with heat donned to pt's low back while pt performs interventions. Pt reports heat feels good  LTRs 20x AAROM, 20x AROM  Piriformis stretch 60 sec each LE SLR 10x AROM each LE. Pt rates easy. Pt progressed to 3# weights donned for 2x10 each LE Glute bridge 2x15. Cuing to perform through pain-free ROM. Knee to chest stretch 2x30 sec each LE Hamstring neural stretch with ankle DF/PF x18 reps each LE- Pt reports he feels a "good kind of pain" that improves with addition of DF/PF. Hip circles with knee flexed and extended AROM/AAROM 10-15x  for each CW/CC each LE Glute bridge 15x Side-lying clamshell 2x15 each side. Rates easier with LLE, challenging with RLE  Standing trunk twists 6x  Pt reports back feels "loosened up" at end of session but pain remains at baseline  Education provided throughout session via VC/TC and demonstration to facilitate movement at target joints and correct muscle activation for all testing and exercises performed.    Rationale for Evaluation and Treatment Rehabilitation         PT Education - 10/09/21 1101     Education Details exercise technique, body mechanics    Person(s) Educated Patient    Methods Explanation;Demonstration;Tactile cues;Verbal cues    Comprehension Verbalized understanding;Returned demonstration;Verbal cues required;Need further instruction              PT Short Term Goals - 08/20/21 1001       PT SHORT TERM GOAL #1   Title Patient will be adherent to HEP at least 3x a week to improve functional strength and balance for better safety at home.    Baseline 4/4: doing them 1-2x a week; 08/20/2021=Patient reports performing his low back stretching and no questions currently HEP.    Time 4    Period Weeks    Status Achieved    Target Date 08/21/21      PT SHORT TERM GOAL #2   Title Patient (< 36 years old) will complete five times sit to stand test in < 15 seconds indicating an increased LE strength and improved balance.    Baseline 4/4: 17.85 sec with arms across chest; 08/20/2021= 17.6 sec    Time 4    Period Weeks    Status On-going    Target Date 10/01/21               PT Long Term Goals - 08/20/21 1001       PT LONG TERM GOAL #1   Title Patient will increase six minute walk test distance to >1000 for progression to community ambulator and improve gait ability    Baseline 4/4: 1115 feet    Time 4    Period Weeks    Status Achieved    Target Date 08/21/21      PT LONG TERM GOAL #2   Title Patient will ascend/descend 8 stairs without rail assist  independently without loss of balance to improve ability to get in/out of home.    Baseline 4/4: requires 1 rail assist; 08/20/2021- Patient demonstrated steps without railing using reciprocal steps today.    Time 4    Period Weeks    Status Achieved    Target Date 08/21/21  PT LONG TERM GOAL #3   Title Patient will increase BLE gross strength to 4+/5 as to improve functional strength for independent gait, increased standing tolerance and increased ADL ability.    Baseline 4/4: not formally assessed; 08/20/2021= 4+/5 with Right hip flex/knee ext/flex; Right hip abd= 4/5    Time 12    Period Weeks    Status Partially Met    Target Date 11/12/21      PT LONG TERM GOAL #4   Title Patient will improve FOTO score to >50% to indicate improved functional mobility with less pain with ADLs.    Baseline 4/4: 35%; 08/20/2021= 42%    Time 12    Period Weeks    Status On-going    Target Date 11/12/21      PT LONG TERM GOAL #5   Title Patient will report a worst pain of 4/10 in low back over last week to indicate improved tolerance with ADLs.    Baseline 4/4: 7/10; 08/20/2021= 4/10    Time 12    Period Weeks    Status Partially Met    Target Date 11/12/21      Additional Long Term Goals   Additional Long Term Goals Yes      PT LONG TERM GOAL #6   Title Pt will increase 10MWT by at least 0.13 m/s in order to demonstrate clinically significant improvement in community ambulation    Baseline 08/20/2021=0.94 m/s    Time 12    Period Weeks    Status New    Target Date 11/12/21      PT LONG TERM GOAL #7   Title Patient will demonstrate improved dynamic standing balance as seen by ability to single leg stance on right LE > 12 sec consistently for optimal balance on level and unlevel surfaces.    Baseline 08/20/2021= 4 sec SLS on right    Time 12    Period Weeks    Status New    Target Date 11/12/21                   Plan - 10/09/21 1105     Clinical Impression Statement Pt tolerates  interventions fair without pain increasing from baseline levels. Following interventions pt did report low back feeling "loosened up," however, pain remains at baseline. He reports greatest challenge with RLE clamshell. The pt will benefit from further skilled PT to improve pain, strength and mobility.    Personal Factors and Comorbidities Comorbidity 3+;Past/Current Experience;Time since onset of injury/illness/exacerbation    Comorbidities PMHx includes: Bilateral LBP with right sided sciatica, Lumbosacral spondylosis myelopathy, closed compression Fx of L1. Has chronic insomnia. osteoarthritis, Panic anxiety syndrome (taking medication)    Examination-Activity Limitations Bend;Carry;Locomotion Level;Sit;Squat;Stairs;Stand;Transfers    Examination-Participation Restrictions Cleaning;Community Activity;Driving;Laundry;Meal Prep;Occupation;Shop;Volunteer;Yard Work    Stability/Clinical Decision Making Stable/Uncomplicated    Rehab Potential Good    PT Frequency 2x / week    PT Duration 12 weeks    PT Treatment/Interventions Cryotherapy;Electrical Stimulation;Moist Heat;Traction;Gait training;Stair training;Functional mobility training;Therapeutic activities;Therapeutic exercise;Balance training;Neuromuscular re-education;Patient/family education;Manual techniques;Passive range of motion;Dry needling;Energy conservation    PT Next Visit Plan Progress core stabilization, Manual therapy for low back and LE ROM, continue plan    PT Home Exercise Plan no updates    Consulted and Agree with Plan of Care Patient             Patient will benefit from skilled therapeutic intervention in order to improve the following deficits and impairments:  Abnormal gait,  Decreased endurance, Decreased mobility, Difficulty walking, Increased muscle spasms, Decreased range of motion, Decreased activity tolerance, Decreased strength, Postural dysfunction, Pain  Visit Diagnosis: Chronic bilateral low back pain without  sciatica  Muscle weakness (generalized)     Problem List Patient Active Problem List   Diagnosis Date Noted   Alterations of sensations following cerebrovascular accident 03/08/2021   Aphasia as late effect of cerebrovascular accident (CVA) 03/08/2021   Muscle spasm 02/22/2021   Acute non-recurrent frontal sinusitis 02/22/2021   Chronic pain syndrome 01/23/2021   Primary hypertension 01/23/2021   Nerve pain 01/23/2021   Erectile dysfunction due to diseases classified elsewhere 01/23/2021   Cognitive dysfunction 01/23/2021   Scrotal edema 01/23/2021   Elevated LDL cholesterol level 01/23/2021   History of stroke with residual deficit 01/23/2021   Primary insomnia 01/23/2021   Mouth pain 01/23/2021   Laceration of right hand without foreign body    Closed compression fracture of L1 vertebra ([place]) 05/24/2016   Lumbar stenosis with neurogenic claudication 11/01/2015   Chronic 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 11/08/2014   Hx of hemorrhoids 11/08/2014   AAA (abdominal aortic aneurysm) ([place]) 09/22/2014   Chronic pain associated with significant psychosocial dysfunction 09/22/2014   Panic disorder 09/22/2014   AB (asthmatic bronchitis) 08/17/2014   Anxiety 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 08/17/2014   Backache 08/17/2014   Lumbosacral spondylosis without myelopathy 08/17/2014   Disorder of male genital organs 08/17/2014   Brash 08/17/2014   Low back pain 08/17/2014   Tendon nodule 08/17/2014   Episodic paroxysmal anxiety disorder 08/17/2014   Hernia, inguinal, right 08/17/2014   Fast heart beat 08/17/2014   Illness 08/17/2014   Inguinal hernia 10/14/2012    Zollie Pee, PT 10/09/2021, 11:09 AM  [place], [place], [address] Phone: [phone]   Fax:  [phone]  Name: David Reeves MRN: [id number] Date of Birth: April 04, 1971

## 2021-10-09 NOTE — Therapy (Deleted)
[place] MAIN [facility] SERVICES [address] [place], [place], [address] Phone: [phone]   Fax:  [phone]  Occupational Therapy Treatment  Patient Details  Name: David Reeves MRN: [id number] Date of Birth: Feb 16, 1971 Referring Provider (OT): [name]   Encounter Date: 10/09/2021   OT End of Session - 10/09/21 0910     Visit Number 34    Number of Visits 44    Date for OT Re-Evaluation 11/01/21    Authorization Type Progress reporting period starting 08/10/21    OT Start Time 0905    OT Stop Time 0950    OT Time Calculation (min) 45 min    Activity Tolerance Patient tolerated treatment well    Behavior During Therapy [facility] for tasks assessed/performed             Past Medical History:  Diagnosis Date   Back pain 04/22/2012   Bone spur    Bulging disc 04/22/2012   Degenerative disc disease    Osteoarthritis    Panic anxiety syndrome    Stroke [facility]) 10/2020   Taking multiple medications for chronic disease     Past Surgical History:  Procedure Laterality Date   CYSTECTOMY     head   HERNIA REPAIR Left 2006,2014   [facility]   TOE SURGERY Right 2007    There were no vitals filed for this visit.   Subjective Assessment - 10/09/21 0903     Subjective  "I'm going to the pain doctor today."    Patient is accompanied by: Family member    Pertinent History Pt. is a 51 y.o. who was diagnosed with a CVA on July 21st, 2022. Pt. completed several weeks of  inpatient rehab at [facility]. After returning to home, Pt. sustained a fall in december of 2022, and was admitted to the hospital with COVI-19, and back pain from the fall. Since the most recent discharge, Pt. has been residing with the ex-wife until the Pt. is ready to return to independent living. Pt. PMHx includes: Bilateral LBP with right sided sciatica, Lumbosacral spondylosis myelopathy, closed compression Fx of L1. Pt. enjoys cooking, and riding motorcycles.    Patient  Stated Goals Pt would like to be as independent as he was before, improve hand function.    Currently in Pain? Yes    Pain Score 4     Pain Location Back    Pain Orientation Lower    Pain Descriptors / Indicators Aching;Sore    Pain Type Chronic pain    Pain Radiating Towards low back    Pain Onset More than a month ago    Pain Frequency Constant    Aggravating Factors  bending/lifting/prolonged sitting or laying down    Pain Relieving Factors heat, pillow    Effect of Pain on Daily Activities poor sitting tolerance    Multiple Pain Sites No            Occupational Therapy Treatment: Therapeutic Exercise:   Neuro re-ed:      Response to Treatment:                       OT Education - 10/09/21 0910     Education Details hand exercises and [facility] skills    Person(s) Educated Patient    Methods Explanation;Demonstration;Tactile cues;Verbal cues    Comprehension Verbalized understanding;Returned demonstration;Verbal cues required;Tactile cues required;Need further instruction  OT Short Term Goals - 08/10/21 1330       OT SHORT TERM GOAL #1   Title Pt. wil demonstrtae independence with HEPs    Baseline Eval: Pt. currently does not have one, 10th visit:  changes in HEP ongoing; 20th: instructed in putty exercises, changes ongoing    Time 6    Period Weeks    Status On-going    Target Date 09/20/21               OT Long Term Goals - 08/10/21 1331       OT LONG TERM GOAL #1   Title Pt. will improve RUE strength by 2 mm grades to assist with ADLs, and IADLs,    Baseline Eval: Right shoulder flexion, abduction 3/5, elbow flexion, extension wrist extension 3+/5, 10th visit: improving with RUE strength by not yet met goal; 20th visit: R shd flex/abd 4-, elbow flex/ext 4+, wrist flex 4-, wrist ext 4+    Time 12    Period Weeks    Status On-going    Target Date 11/01/21      OT LONG TERM GOAL #2   Title Pt. will improve right  grip by 10 lbs to prepare for firmly holding objects for IADLs.    Baseline Eval: R: 38#, L: 124# (In 3rd dynamometer slot); R 35# in 4th slot, 28# in 3rd slot    Time 12    Period Weeks    Status On-going    Target Date 11/01/21      OT LONG TERM GOAL #3   Title Pt. will improve right lateral pinch strength by 5# to assist with cutting food    Baseline Eval: R: 17, L: 25; 20th: R: 18    Time 12    Period Weeks    Status On-going    Target Date 11/01/21      OT LONG TERM GOAL #4   Title Pt. will improve Right 3pt pinch by 2# to be able to hold/open items for cooking    Baseline Eval: R: Pt. unable to engage thumb L: 29#; 20th: R unable    Time 12    Period Weeks    Status On-going    Target Date 11/01/21      OT LONG TERM GOAL #5   Title Pt. will improve right hand [facility] skills to be able to independently manipulate buttons, and zippers.    Baseline Eval: Pt. has difficulty managing buttons,a nd zippers. 10th visit:  improving; 20th: unable to engage R hand effectively, performs 1 handed with L    Time 12    Period Weeks    Status On-going    Target Date 11/01/21      OT LONG TERM GOAL #6   Title Pt. will independently write his name    Baseline Eval: Pt. is unable to hold a pen; 20th: pt can grip PenAgain with R hand and can make marks on paper with difficulty, but not yet writing name    Time 12    Period Weeks    Status On-going    Target Date 11/01/21      OT LONG TERM GOAL #7   Title Pt. will improve FOTO score by 2 points to reflect funational improvement    Baseline Eval: FOTO score 46 with TR score 52; 20th: 45    Time 12    Period Weeks    Status On-going    Target Date 11/01/21  Patient will benefit from skilled therapeutic intervention in order to improve the following deficits and impairments:           Visit Diagnosis: Muscle weakness (generalized)  Other lack of coordination    Problem List Patient Active Problem  List   Diagnosis Date Noted   Alterations of sensations following cerebrovascular accident 03/08/2021   Aphasia as late effect of cerebrovascular accident (CVA) 03/08/2021   Muscle spasm 02/22/2021   Acute non-recurrent frontal sinusitis 02/22/2021   Chronic pain syndrome 01/23/2021   Primary hypertension 01/23/2021   Nerve pain 01/23/2021   Erectile dysfunction due to diseases classified elsewhere 01/23/2021   Cognitive dysfunction 01/23/2021   Scrotal edema 01/23/2021   Elevated LDL cholesterol level 01/23/2021   History of stroke with residual deficit 01/23/2021   Primary insomnia 01/23/2021   Mouth pain 01/23/2021   Laceration of right hand without foreign body    Closed compression fracture of L1 vertebra ([place]) 05/24/2016   Lumbar stenosis with neurogenic claudication 11/01/2015   Chronic 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 11/08/2014   Hx of hemorrhoids 11/08/2014   AAA (abdominal aortic aneurysm) ([place]) 09/22/2014   Chronic pain associated with significant psychosocial dysfunction 09/22/2014   Panic disorder 09/22/2014   AB (asthmatic bronchitis) 08/17/2014   Anxiety 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 08/17/2014   Backache 08/17/2014   Lumbosacral spondylosis without myelopathy 08/17/2014   Disorder of male genital organs 08/17/2014   Brash 08/17/2014   Low back pain 08/17/2014   Tendon nodule 08/17/2014   Episodic paroxysmal anxiety disorder 08/17/2014   Hernia, inguinal, right 08/17/2014   Fast heart beat 08/17/2014   Illness 08/17/2014   Inguinal hernia 10/14/2012    Darleene Cleaver, OT 10/09/2021, 9:16 AM  [place], [place], [address] Phone: [phone]   Fax:  [phone]  Name: David Reeves MRN: [id number] Date of Birth: [birth year]

## 2021-10-10 ENCOUNTER — Other Ambulatory Visit: Payer: Self-pay | Admitting: Physician Assistant

## 2021-10-10 DIAGNOSIS — G8929 Other chronic pain: Secondary | ICD-10-CM

## 2021-10-10 DIAGNOSIS — G8921 Chronic pain due to trauma: Secondary | ICD-10-CM | POA: Diagnosis not present

## 2021-10-10 DIAGNOSIS — G894 Chronic pain syndrome: Secondary | ICD-10-CM | POA: Diagnosis not present

## 2021-10-10 DIAGNOSIS — F1290 Cannabis use, unspecified, uncomplicated: Secondary | ICD-10-CM | POA: Diagnosis not present

## 2021-10-10 DIAGNOSIS — F419 Anxiety disorder, unspecified: Secondary | ICD-10-CM | POA: Diagnosis not present

## 2021-10-10 DIAGNOSIS — Z79899 Other long term (current) drug therapy: Secondary | ICD-10-CM | POA: Diagnosis not present

## 2021-10-10 DIAGNOSIS — I10 Essential (primary) hypertension: Secondary | ICD-10-CM | POA: Diagnosis not present

## 2021-10-10 DIAGNOSIS — I69351 Hemiplegia and hemiparesis following cerebral infarction affecting right dominant side: Secondary | ICD-10-CM | POA: Diagnosis not present

## 2021-10-10 DIAGNOSIS — M791 Myalgia, unspecified site: Secondary | ICD-10-CM | POA: Diagnosis not present

## 2021-10-10 DIAGNOSIS — I6930 Unspecified sequelae of cerebral infarction: Secondary | ICD-10-CM

## 2021-10-10 DIAGNOSIS — I69322 Dysarthria following cerebral infarction: Secondary | ICD-10-CM | POA: Diagnosis not present

## 2021-10-10 DIAGNOSIS — F1120 Opioid dependence, uncomplicated: Secondary | ICD-10-CM | POA: Diagnosis not present

## 2021-10-10 NOTE — Telephone Encounter (Signed)
Medication Refill - Medication: tiZANidine (ZANAFLEX) 4 MG tablet  Has the patient contacted their pharmacy?  pharmacy called in  (Agent: If no, request that the patient contact the pharmacy for the refill. If patient does not wish to contact the pharmacy document the reason why and proceed with request.) (Agent: If yes, when and what did the pharmacy advise?)pharmacy called in   Preferred Pharmacy (with phone number or street name): [facility] - [place], [place] - [address] [place] [address] Phone:  [phone]  Fax:  [phone]     Has the patient been seen for an appointment in the last year OR does the patient have an upcoming appointment? no  Agent: Please be advised that RX refills may take up to 3 business days. We ask that you follow-up with your pharmacy.

## 2021-10-10 NOTE — Therapy (Signed)
[place] MAIN [facility] SERVICES [address] [place], [place], [address] Phone: [phone]   Fax:  [phone]  Speech Language Pathology Treatment  Patient Details  Name: David Reeves MRN: [id number] Date of Birth: 06-10-1970 Referring Provider (SLP): [name]   Encounter Date: 10/09/2021   End of Session - 10/10/21 1252     Visit Number 25    Number of Visits 42    Date for SLP Re-Evaluation 11/20/21    Authorization Type [employer] H[employer]    Authorization Time Period 08/27/2021 thru 11/20/2021    Authorization - Visit Number 5    Progress Note Due on Visit 10    SLP Start Time 0800    SLP Stop Time  0900    SLP Time Calculation (min) 60 min    Activity Tolerance Treatment limited secondary to agitation             Past Medical History:  Diagnosis Date   Back pain 04/22/2012   Bone spur    Bulging disc 04/22/2012   Degenerative disc disease    Osteoarthritis    Panic anxiety syndrome    Stroke [facility]) 10/2020   Taking multiple medications for chronic disease     Past Surgical History:  Procedure Laterality Date   CYSTECTOMY     head   HERNIA REPAIR Left 2006,2014   [facility]   TOE SURGERY Right 2007    There were no vitals filed for this visit.   Subjective Assessment - 10/10/21 1245     Subjective pt in 7/10 pain with lower back    Currently in Pain? Yes    Pain Score 7     Pain Location Back    Pain Orientation Lower    Pain Descriptors / Indicators Aching;Sore    Pain Type Chronic pain    Pain Onset More than a month ago    Pain Frequency Constant    Pain Relieving Factors heat, pillow    Effect of Pain on Daily Activities difficulty concentrating, decreased speech intelligibility    Multiple Pain Sites No                   ADULT SLP TREATMENT - 10/10/21 0001       Treatment Provided   Treatment provided Cognitive-Linquistic      Cognitive-Linquistic Treatment   Treatment  focused on Cognition;Patient/family/caregiver education    Skilled Treatment Skilled treatment session focused on pt's cognition goals, specifically semi-complex problem solving, preplanning and organizing. SLP facilitated session by utilizing the Constant Clinician Therapist: Read a Map: Level 3 - 75% improving to 85% with moderate assistance to continue to work thru frustration with task              SLP Education - 10/10/21 1251     Education Details working thru frustration with task to prevent quitting task entirely    Person(s) Educated Patient    Methods Explanation;Demonstration;Verbal cues    Comprehension Need further instruction              SLP Short Term Goals - 09/07/21 0836       SLP SHORT TERM GOAL #1   Title Pt will use speech intelligibility strategies to slow rate of speech at the conversational level to achive ~ 60% speech intelligibility.    Baseline previous goal met, goal advanced to reflect progress    Period --   sessions   Status Revised  SLP SHORT TERM GOAL #2   Title Pt will produce fricatives in the initial, final and medial positions of words with 90% accuracy.    Status Deferred      SLP SHORT TERM GOAL #4   Title Pt will answer WH questions after reading short story (5-6 sentences) with 50% accuracy given min to mod cues.    Time 10    Period --   sessions   Status On-going      SLP SHORT TERM GOAL #5   Title Pt will a) recall and b) demonstrate use of at least three external memory strategies.    Time 10    Period --   sessions   Status On-going              SLP Long Term Goals - 09/07/21 0837       SLP LONG TERM GOAL #1   Title Pt will use speech intelligibility strategies to achieve 60% speech intelligibility at the simple conversation level.    Baseline goal met, revised to reflect progress    Time 12    Period Weeks    Status Revised    Target Date 11/20/21      SLP LONG TERM GOAL #2   Title Patient will  demonstrate improved cognitive linguistic function for IND completion of ADLs tasks in home/community environments    Target Date 11/20/21              Plan - 10/10/21 1252     Clinical Impression Statement Pt continues to display decreased mental flexibility and demonstrates avoidance and anger related to difficult tasks. While he is making slow progress towards his goals, this is definitely a barrier to potential cognitive improvement. Continue to remind brief course of skilled ST intervention in the hopes of progressing pt's functional cognitive abilities to improve his functional independence.    Speech Therapy Frequency 2x / week    Duration 12 weeks    Treatment/Interventions Language facilitation;Compensatory techniques;Cueing hierarchy;Internal/external aids;SLP instruction and feedback;Patient/family education;Compensatory strategies;Functional tasks;Cognitive reorganization    Potential to Achieve Goals Fair    Potential Considerations Pain level;Cooperation/participation level;Previous level of function;Family/community support;Severity of impairments    Consulted and Agree with Plan of Care Patient             Patient will benefit from skilled therapeutic intervention in order to improve the following deficits and impairments:   Cognitive communication deficit  Cognitive dysfunction    Problem List Patient Active Problem List   Diagnosis Date Noted   Alterations of sensations following cerebrovascular accident 03/08/2021   Aphasia as late effect of cerebrovascular accident (CVA) 03/08/2021   Muscle spasm 02/22/2021   Acute non-recurrent frontal sinusitis 02/22/2021   Chronic pain syndrome 01/23/2021   Primary hypertension 01/23/2021   Nerve pain 01/23/2021   Erectile dysfunction due to diseases classified elsewhere 01/23/2021   Cognitive dysfunction 01/23/2021   Scrotal edema 01/23/2021   Elevated LDL cholesterol level 01/23/2021   History of stroke with  residual deficit 01/23/2021   Primary insomnia 01/23/2021   Mouth pain 01/23/2021   Laceration of right hand without foreign body    Closed compression fracture of L1 vertebra ([facility]) 05/24/2016   Lumbar stenosis with neurogenic claudication 11/01/2015   Chronic 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 11/08/2014   Hx of hemorrhoids 11/08/2014   AAA (abdominal aortic aneurysm) ([facility]) 09/22/2014   Chronic pain associated with significant psychosocial dysfunction 09/22/2014   Panic disorder 09/22/2014     AB (asthmatic bronchitis) 08/17/2014   Anxiety 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 08/17/2014   Backache 08/17/2014   Lumbosacral spondylosis without myelopathy 08/17/2014   Disorder of male genital organs 08/17/2014   Brash 08/17/2014   Low back pain 08/17/2014   Tendon nodule 08/17/2014   Episodic paroxysmal anxiety disorder 08/17/2014   Hernia, inguinal, right 08/17/2014   Fast heart beat 08/17/2014   Illness 08/17/2014   Inguinal hernia 10/14/2012   Guss Farruggia B. Rutherford Nail M.S., CCC-SLP, [place] Pathologist Certified Brain Injury [place]  [facility] [phone] Ascom [phone] Fax [phone]  Clarisa Fling 10/10/2021, 12:53 PM  [place] MAIN [facility] SERVICES [address] [place], [place], [address] Phone: [phone]   Fax:  [phone]   Name: Ayodeji Keimig MRN: [id number] Date of Birth: October 25, 1970

## 2021-10-10 NOTE — Telephone Encounter (Signed)
Refilled 10/02/21 #360 with 1 refill.

## 2021-10-12 ENCOUNTER — Ambulatory Visit: Payer: Medicare HMO

## 2021-10-12 ENCOUNTER — Ambulatory Visit: Payer: Medicare HMO | Admitting: Speech Pathology

## 2021-10-12 DIAGNOSIS — R278 Other lack of coordination: Secondary | ICD-10-CM

## 2021-10-12 DIAGNOSIS — G8929 Other chronic pain: Secondary | ICD-10-CM

## 2021-10-12 DIAGNOSIS — R41841 Cognitive communication deficit: Secondary | ICD-10-CM

## 2021-10-12 DIAGNOSIS — M6281 Muscle weakness (generalized): Secondary | ICD-10-CM | POA: Diagnosis not present

## 2021-10-12 DIAGNOSIS — M545 Low back pain, unspecified: Secondary | ICD-10-CM | POA: Diagnosis not present

## 2021-10-12 DIAGNOSIS — F09 Unspecified mental disorder due to known physiological condition: Secondary | ICD-10-CM

## 2021-10-12 DIAGNOSIS — R482 Apraxia: Secondary | ICD-10-CM | POA: Diagnosis not present

## 2021-10-12 DIAGNOSIS — Z8673 Personal history of transient ischemic attack (TIA), and cerebral infarction without residual deficits: Secondary | ICD-10-CM

## 2021-10-12 DIAGNOSIS — R269 Unspecified abnormalities of gait and mobility: Secondary | ICD-10-CM | POA: Diagnosis not present

## 2021-10-13 ENCOUNTER — Encounter: Payer: Self-pay | Admitting: Speech Pathology

## 2021-10-13 ENCOUNTER — Other Ambulatory Visit: Payer: Self-pay | Admitting: Physician Assistant

## 2021-10-13 DIAGNOSIS — G8929 Other chronic pain: Secondary | ICD-10-CM

## 2021-10-13 DIAGNOSIS — F410 Panic disorder [episodic paroxysmal anxiety] without agoraphobia: Secondary | ICD-10-CM

## 2021-10-13 DIAGNOSIS — I6930 Unspecified sequelae of cerebral infarction: Secondary | ICD-10-CM

## 2021-10-16 ENCOUNTER — Ambulatory Visit: Payer: Medicare HMO | Admitting: Speech Pathology

## 2021-10-16 ENCOUNTER — Ambulatory Visit: Payer: Medicare HMO

## 2021-10-16 DIAGNOSIS — G8929 Other chronic pain: Secondary | ICD-10-CM | POA: Diagnosis not present

## 2021-10-16 DIAGNOSIS — R482 Apraxia: Secondary | ICD-10-CM | POA: Diagnosis not present

## 2021-10-16 DIAGNOSIS — R269 Unspecified abnormalities of gait and mobility: Secondary | ICD-10-CM | POA: Diagnosis not present

## 2021-10-16 DIAGNOSIS — M545 Low back pain, unspecified: Secondary | ICD-10-CM | POA: Diagnosis not present

## 2021-10-16 DIAGNOSIS — R278 Other lack of coordination: Secondary | ICD-10-CM | POA: Diagnosis not present

## 2021-10-16 DIAGNOSIS — F09 Unspecified mental disorder due to known physiological condition: Secondary | ICD-10-CM

## 2021-10-16 DIAGNOSIS — R41841 Cognitive communication deficit: Secondary | ICD-10-CM

## 2021-10-16 DIAGNOSIS — M6281 Muscle weakness (generalized): Secondary | ICD-10-CM

## 2021-10-16 DIAGNOSIS — Z8673 Personal history of transient ischemic attack (TIA), and cerebral infarction without residual deficits: Secondary | ICD-10-CM | POA: Diagnosis not present

## 2021-10-17 NOTE — Therapy (Signed)
OUTPATIENT SPEECH LANGUAGE PATHOLOGY TREATMENT NOTE DISCHARGE SUMMARY   Patient Name: David Reeves MRN: [id number] DOB:[birth year], 51 y.o., male Today's Date: 10/17/2021  PCP: [name], PA-C REFERRING PROVIDER: [name], PA-C   End of Session - 10/17/21 2123     Visit Number 27    Number of Visits 42    Date for SLP Re-Evaluation 11/20/21    Authorization Type [employer] HMO    Authorization Time Period 08/27/2021 thru 11/20/2021    Authorization - Visit Number 7    Progress Note Due on Visit 10    SLP Start Time 0811    SLP Stop Time  0903    SLP Time Calculation (min) 52 min    Activity Tolerance Patient tolerated treatment well             Past Medical History:  Diagnosis Date   Back pain 04/22/2012   Bone spur    Bulging disc 04/22/2012   Degenerative disc disease    Osteoarthritis    Panic anxiety syndrome    Stroke ([place]) 10/2020   Taking multiple medications for chronic disease    Past Surgical History:  Procedure Laterality Date   CYSTECTOMY     head   HERNIA REPAIR Left 2006,2014   [facility]   TOE SURGERY Right 2007   Patient Active Problem List   Diagnosis Date Noted   Alterations of sensations following cerebrovascular accident 03/08/2021   Aphasia as late effect of cerebrovascular accident (CVA) 03/08/2021   Muscle spasm 02/22/2021   Acute non-recurrent frontal sinusitis 02/22/2021   Chronic pain syndrome 01/23/2021   Primary hypertension 01/23/2021   Nerve pain 01/23/2021   Erectile dysfunction due to diseases classified elsewhere 01/23/2021   Cognitive dysfunction 01/23/2021   Scrotal edema 01/23/2021   Elevated LDL cholesterol level 01/23/2021   History of stroke with residual deficit 01/23/2021   Primary insomnia 01/23/2021   Mouth pain 01/23/2021   Laceration of right hand without foreign body    Closed compression fracture of L1 vertebra ([place]) 05/24/2016   Lumbar stenosis with neurogenic claudication 11/01/2015    Chronic 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 11/08/2014   Hx of hemorrhoids 11/08/2014   AAA (abdominal aortic aneurysm) ([place]) 09/22/2014   Chronic pain associated with significant psychosocial dysfunction 09/22/2014   Panic disorder 09/22/2014   AB (asthmatic bronchitis) 08/17/2014   Anxiety 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 08/17/2014   Backache 08/17/2014   Lumbosacral spondylosis without myelopathy 08/17/2014   Disorder of male genital organs 08/17/2014   Brash 08/17/2014   Low back pain 08/17/2014   Tendon nodule 08/17/2014   Episodic paroxysmal anxiety disorder 08/17/2014   Hernia, inguinal, right 08/17/2014   Fast heart beat 08/17/2014   Illness 08/17/2014   Inguinal hernia 10/14/2012    ONSET DATE: 11/08/2020  REFERRING DIAG:  I69.30 (ICD-10-CM) - History of stroke with residual deficit  I69.320 (ICD-10-CM) - Aphasia as late effect of cerebrovascular accident (CVA)    THERAPY DIAG:  Cognitive communication deficit  Cognitive dysfunction  Rationale for Evaluation and Treatment Rehabilitation  SUBJECTIVE: Pt reports good results from recent appt with pain MD  PAIN:  Are you having pain? Yes: NPRS scale: 5/10 Pain location: lower back Pain description: aching Aggravating factors: constant Relieving factors: heat     OBJECTIVE:   TODAY'S TREATMENT: Skilled treatment session focused on pt's cognition goals, specifically improving task tolerance, organization, visuospatial skills, and semi-complex reasoning. SLP facilitated sessions by administering the Neuro-QOL Cognitive Short  form. Pt with score of 44.9 which is .5 SD below average. Pt's score on his previous Neuro-QOL on 08/29/2021 was 43.9. Pt reports, "pain is the only thing that effect my daily life beside residual effects of my stroke (pointing to his right arm).   PATIENT EDUCATION: Education details: poor task tolerance Person educated: Patient Education method:  Explanation Education comprehension: verbalized understanding   SLP Short Term Goals -       SLP SHORT TERM GOAL #1   Title Pt will use speech intelligibility strategies to slow rate of speech at the conversational level to achive ~ 60% speech intelligibility.    Baseline previous goal met, goal advanced to reflect progress    Period --   sessions   Status Not Met     SLP SHORT TERM GOAL #2   Title Pt will produce fricatives in the initial, final and medial positions of words with 90% accuracy.    Status Deferred      SLP SHORT TERM GOAL #4   Title Pt will answer [place] questions after reading short story (5-6 sentences) with 50% accuracy given min to mod cues.    Time 10    Period --   sessions   Status Not Met     SLP SHORT TERM GOAL #5   Title Pt will a) recall and b) demonstrate use of at least three external memory strategies.    Time 10    Period --   sessions   Status Not Met             SLP Long Term Goals -       SLP LONG TERM GOAL #1   Title Pt will use speech intelligibility strategies to achieve 60% speech intelligibility at the simple conversation level.    Baseline goal met, revised to reflect progress    Time 12    Period Weeks    Status Not Met     SLP LONG TERM GOAL #2   Title Patient will demonstrate improved cognitive linguistic function for IND completion of ADLs tasks in home/community environments   Met            Plan -     Clinical Impression Statement While pt continues to present with moderate apraxia of speech as well as moderate cognitive deficits, pt is likely at his new baseline as he has not progressed to his goals. At this time, pt is not benefiting from skilled ST intervention. Pt in agreement with discharging services.    Speech Therapy Frequency 2x / week    Duration 12 weeks    Treatment/Interventions Language facilitation;Compensatory techniques;Cueing hierarchy;Internal/external aids;SLP instruction and  feedback;Patient/family education;Compensatory strategies;Functional tasks;Cognitive reorganization    Potential to Achieve Goals Fair    Potential Considerations Pain level;Cooperation/participation level;Previous level of function;Family/community support;Severity of impairments    Consulted and Agree with Plan of Care Patient             Dolan Xia B. Rutherford Nail M.S., CCC-SLP, [place] Pathologist Certified Brain Injury [place]  [facility] Office [phone] Ascom [phone] Fax [phone]   Stormy Fabian, [place] 10/17/2021, 9:26 PM

## 2021-10-18 DIAGNOSIS — M479 Spondylosis, unspecified: Secondary | ICD-10-CM | POA: Diagnosis not present

## 2021-10-18 DIAGNOSIS — S32000D Wedge compression fracture of unspecified lumbar vertebra, subsequent encounter for fracture with routine healing: Secondary | ICD-10-CM | POA: Diagnosis not present

## 2021-10-19 ENCOUNTER — Ambulatory Visit: Payer: Medicare HMO

## 2021-10-19 ENCOUNTER — Encounter: Payer: Medicare HMO | Admitting: Speech Pathology

## 2021-10-19 DIAGNOSIS — R269 Unspecified abnormalities of gait and mobility: Secondary | ICD-10-CM | POA: Diagnosis not present

## 2021-10-19 DIAGNOSIS — M6281 Muscle weakness (generalized): Secondary | ICD-10-CM

## 2021-10-19 DIAGNOSIS — R278 Other lack of coordination: Secondary | ICD-10-CM

## 2021-10-19 DIAGNOSIS — R482 Apraxia: Secondary | ICD-10-CM | POA: Diagnosis not present

## 2021-10-19 DIAGNOSIS — F09 Unspecified mental disorder due to known physiological condition: Secondary | ICD-10-CM | POA: Diagnosis not present

## 2021-10-19 DIAGNOSIS — G8929 Other chronic pain: Secondary | ICD-10-CM | POA: Diagnosis not present

## 2021-10-19 DIAGNOSIS — M545 Low back pain, unspecified: Secondary | ICD-10-CM | POA: Diagnosis not present

## 2021-10-19 DIAGNOSIS — R41841 Cognitive communication deficit: Secondary | ICD-10-CM | POA: Diagnosis not present

## 2021-10-19 DIAGNOSIS — Z8673 Personal history of transient ischemic attack (TIA), and cerebral infarction without residual deficits: Secondary | ICD-10-CM | POA: Diagnosis not present

## 2021-10-19 NOTE — Therapy (Signed)
OUTPATIENT OCCUPATIONAL THERAPY TREATMENT NOTE   Patient Name: David Reeves MRN: [id number] DOB:[birth year], 51 y.o., male Today's Date: 10/19/2021  PCP: [name], PA-C REFERRING PROVIDER: [name], PA-C  END OF SESSION:   OT End of Session - 10/19/21 2202     Visit Number 37    Number of Visits 80    Date for OT Re-Evaluation 11/01/21    Authorization Type Progress reporting period starting 08/10/21    OT Start Time 0915    OT Stop Time 1000    OT Time Calculation (min) 45 min    Activity Tolerance Patient tolerated treatment well    Behavior During Therapy [facility] for tasks assessed/performed             Past Medical History:  Diagnosis Date   Back pain 04/22/2012   Bone spur    Bulging disc 04/22/2012   Degenerative disc disease    Osteoarthritis    Panic anxiety syndrome    Stroke ([place]) 10/2020   Taking multiple medications for chronic disease    Past Surgical History:  Procedure Laterality Date   CYSTECTOMY     head   HERNIA REPAIR Left 2006,2014   [facility]   TOE SURGERY Right 2007   Patient Active Problem List   Diagnosis Date Noted   Alterations of sensations following cerebrovascular accident 03/08/2021   Aphasia as late effect of cerebrovascular accident (CVA) 03/08/2021   Muscle spasm 02/22/2021   Acute non-recurrent frontal sinusitis 02/22/2021   Chronic pain syndrome 01/23/2021   Primary hypertension 01/23/2021   Nerve pain 01/23/2021   Erectile dysfunction due to diseases classified elsewhere 01/23/2021   Cognitive dysfunction 01/23/2021   Scrotal edema 01/23/2021   Elevated LDL cholesterol level 01/23/2021   History of stroke with residual deficit 01/23/2021   Primary insomnia 01/23/2021   Mouth pain 01/23/2021   Laceration of right hand without foreign body    Closed compression fracture of L1 vertebra ([place]) 05/24/2016   Lumbar stenosis with neurogenic claudication 11/01/2015   Chronic bilateral low back pain with right-sided  sciatica 11/08/2014   Hx of hemorrhoids 11/08/2014   AAA (abdominal aortic aneurysm) ([place]) 09/22/2014   Chronic pain associated with significant psychosocial dysfunction 09/22/2014   Panic disorder 09/22/2014   AB (asthmatic bronchitis) 08/17/2014   Anxiety 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 08/17/2014   Backache 08/17/2014   Lumbosacral spondylosis without myelopathy 08/17/2014   Disorder of male genital organs 08/17/2014   Brash 08/17/2014   Low back pain 08/17/2014   Tendon nodule 08/17/2014   Episodic paroxysmal anxiety disorder 08/17/2014   Hernia, inguinal, right 08/17/2014   Fast heart beat 08/17/2014   Illness 08/17/2014   Inguinal hernia 10/14/2012    ONSET DATE: 11/09/2020  REFERRING DIAG: CVA  THERAPY DIAG:  Apraxia  Muscle weakness (generalized)  Other lack of coordination  Rationale for Evaluation and Treatment Rehabilitation  PERTINENT HISTORY: Pt. is a 50 y.o. who was diagnosed with a CVA on July 21st, 2022. Pt. completed several weeks of  inpatient rehab at [facility]. After returning to home, Pt. sustained a fall in december of 2022, and was admitted to the hospital with COVI-19, and back pain from the fall. Since the most recent discharge, Pt. has been residing with the ex-wife until the Pt. is ready to return to independent living. Pt. PMHx includes: Bilateral LBP with right sided sciatica, Lumbosacral spondylosis myelopathy, closed compression Fx of L1. Pt. enjoys cooking, and riding motorcycles.   PRECAUTIONS: Fall  SUBJECTIVE: "They're going to do some trigger point injections in my back."  PAIN:  Are you having pain? Yes: NPRS scale: 3/10 Pain location: low back Pain description: aching Aggravating factors: prolonged sitting or lying down Relieving factors: rest, heat, walking      OBJECTIVE:  TODAY'S TREATMENT:   10/12/21 Therapeutic Exercise: Performed hand strengthening exercises including digit abd/add, single digit finger taps x 2 sets  10 reps each.  Performed thumb radial abd and lateral pinch strengthening with red clothespin x2 sets 10 reps each.  Therapist assisted with clothespin set up in hand and to maintain prehension patterns to prevent clothespin from slipping.  Performed active ROM for closed fist followed by digit ext x2 sets 10 reps each, cues for achieving max range, specifically with thumb.  Worked with [place] board manipulatives for R wrist flex/ext, pron/sup, and turning a key.  Min A to maintain pieces level on board, and vc for technique.  Neuro re-ed: Pt worked with picking up circular wooden blocks from tabletop and stacking blocks using R hand with a 2 point and 3 point pinch grasp pattern.  Extra time and visual cues for digit extension in prep for grasping fingers around blocks.     PATIENT EDUCATION: Education details: HEP progression Person educated: Patient Education method: Explanation Education comprehension: verbalized understanding and needs further education    OT Short Term Goals -      OT SHORT TERM GOAL #1   Title Pt. wil demonstrtae independence with HEPs    Baseline Eval: Pt. currently does not have one, 10th visit:  changes in HEP ongoing; 20th: instructed in putty exercises, changes ongoing    Time 6    Period Weeks    Status On-going    Target Date 09/20/21             OT Long Term Goals -       OT LONG TERM GOAL #1   Title Pt. will improve RUE strength by 2 mm grades to assist with ADLs, and IADLs,    Baseline Eval: Right shoulder flexion, abduction 3/5, elbow flexion, extension wrist extension 3+/5, 10th visit: improving with RUE strength by not yet met goal; 20th visit: R shd flex/abd 4-, elbow flex/ext 4+, wrist flex 4-, wrist ext 4+    Time 12    Period Weeks    Status On-going    Target Date 11/01/21      OT LONG TERM GOAL #2   Title Pt. will improve right grip by 10 lbs to prepare for firmly holding objects for IADLs.    Baseline Eval: R: 38#, L: 124# (In 3rd  dynamometer slot); R 35# in 4th slot, 28# in 3rd slot    Time 12    Period Weeks    Status On-going    Target Date 11/01/21      OT LONG TERM GOAL #3   Title Pt. will improve right lateral pinch strength by 5# to assist with cutting food    Baseline Eval: R: 17, L: 25; 20th: R: 18    Time 12    Period Weeks    Status On-going    Target Date 11/01/21      OT LONG TERM GOAL #4   Title Pt. will improve Right 3pt pinch by 2# to be able to hold/open items for cooking    Baseline Eval: R: Pt. unable to engage thumb L: 29#; 20th: R unable    Time 12    Period  Weeks    Status On-going    Target Date 11/01/21      OT LONG TERM GOAL #5   Title Pt. will improve right hand [facility] skills to be able to independently manipulate buttons, and zippers.    Baseline Eval: Pt. has difficulty managing buttons,a nd zippers. 10th visit:  improving; 20th: unable to engage R hand effectively, performs 1 handed with L    Time 12    Period Weeks    Status On-going    Target Date 11/01/21      OT LONG TERM GOAL #6   Title Pt. will independently write his name    Baseline Eval: Pt. is unable to hold a pen; 20th: pt can grip PenAgain with R hand and can make marks on paper with difficulty, but not yet writing name    Time 12    Period Weeks    Status On-going    Target Date 11/01/21      OT LONG TERM GOAL #7   Title Pt. will improve FOTO score by 2 points to reflect funational improvement    Baseline Eval: FOTO score 46 with TR score 52; 20th: 45    Time 12    Period Weeks    Status On-going    Target Date 11/01/21              Plan - 10/09/21 1503     Clinical Impression Statement Pt continues to develop R hand strength and coordination skills.  Pt requires manual assist for set up of clothespin in digits to form lateral pinch without thumb slipping.  Improved performance with EZ board manipulatives, but requires min A to maintain pieces level on board, and vc for technique.  Pt continues to  require frequent rest breaks with RUE exercises.  Pt will continue to benefit from skilled OT for increasing distal strength and GMC/FMC skills in order to better engage RUE with daily tasks.    OT Occupational Profile and History Detailed Assessment- Review of Records and additional review of physical, cognitive, psychosocial history related to current functional performance    Occupational performance deficits (Please refer to evaluation for details): ADL's;IADL's;Education    Rehab Potential Good    Clinical Decision Making Several treatment options, min-mod task modification necessary    Comorbidities Affecting Occupational Performance: May have comorbidities impacting occupational performance    Modification or Assistance to Complete Evaluation  Min-Moderate modification of tasks or assist with assess necessary to complete eval    OT Frequency 2x / week    OT Duration 12 weeks    OT Treatment/Interventions Self-care/ADL training;DME and/or AE instruction;Therapeutic exercise;Ultrasound;Neuromuscular education;Therapeutic activities;Energy conservation;Moist Heat;Patient/family education;Splinting;Functional Mobility Training;Paraffin    Plan Neuro re-ed and therapeutic exercise    Consulted and Agree with Plan of Care Patient             Leta Speller, MS, OTR/L  Darleene Cleaver, OT 10/19/2021, 10:03 PM
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
## 2021-10-24 ENCOUNTER — Telehealth: Payer: Self-pay

## 2021-10-24 ENCOUNTER — Ambulatory Visit: Payer: Medicare Other | Attending: Physician Assistant | Admitting: Occupational Therapy

## 2021-10-24 ENCOUNTER — Ambulatory Visit: Payer: Medicare Other | Admitting: Physical Therapy

## 2021-10-24 ENCOUNTER — Encounter: Payer: Medicare HMO | Admitting: Speech Pathology

## 2021-10-24 ENCOUNTER — Encounter: Payer: Self-pay | Admitting: Physical Therapy

## 2021-10-24 DIAGNOSIS — M545 Low back pain, unspecified: Secondary | ICD-10-CM

## 2021-10-24 DIAGNOSIS — R482 Apraxia: Secondary | ICD-10-CM

## 2021-10-24 DIAGNOSIS — R278 Other lack of coordination: Secondary | ICD-10-CM

## 2021-10-24 DIAGNOSIS — G8929 Other chronic pain: Secondary | ICD-10-CM | POA: Diagnosis present

## 2021-10-24 DIAGNOSIS — M6281 Muscle weakness (generalized): Secondary | ICD-10-CM | POA: Insufficient documentation

## 2021-10-24 DIAGNOSIS — Z8673 Personal history of transient ischemic attack (TIA), and cerebral infarction without residual deficits: Secondary | ICD-10-CM | POA: Diagnosis present

## 2021-10-24 DIAGNOSIS — R2681 Unsteadiness on feet: Secondary | ICD-10-CM | POA: Diagnosis present
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
## 2021-10-24 NOTE — Telephone Encounter (Signed)
Copied from CRM [id number]. Topic: General - Other >> Oct 24, 2021  1:35 PM Esperanza Sheets wrote: Patient states he has new health insurance  [employer] Dual Complete  Member id  [id number]  Patient could not locate group number, advised patient to bring current insurance card to office

## 2021-10-26 ENCOUNTER — Encounter: Payer: Medicare HMO | Admitting: Speech Pathology

## 2021-10-26 ENCOUNTER — Ambulatory Visit: Payer: Medicare Other

## 2021-10-26 ENCOUNTER — Encounter: Payer: Self-pay | Admitting: Occupational Therapy

## 2021-10-26 DIAGNOSIS — R278 Other lack of coordination: Secondary | ICD-10-CM

## 2021-10-26 DIAGNOSIS — R482 Apraxia: Secondary | ICD-10-CM

## 2021-10-26 DIAGNOSIS — M6281 Muscle weakness (generalized): Secondary | ICD-10-CM

## 2021-10-26 DIAGNOSIS — G8929 Other chronic pain: Secondary | ICD-10-CM
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
## 2021-10-26 NOTE — Therapy (Signed)
OUTPATIENT OCCUPATIONAL THERAPY TREATMENT NOTE   Patient Name: David Reeves MRN: [id number] DOB:1971-04-11, 51 y.o., male Today's Date: 10/26/2021  PCP: [name], PA-C REFERRING PROVIDER: [name], PA-C  END OF SESSION:   OT End of Session - 10/26/21 0851     Visit Number 38    Number of Visits 3    Date for OT Re-Evaluation 11/01/21    Authorization Type Progress reporting period starting 08/10/21    OT Start Time 0922    OT Stop Time 1010    OT Time Calculation (min) 48 min    Activity Tolerance Patient tolerated treatment well    Behavior During Therapy [facility] for tasks assessed/performed             Past Medical History:  Diagnosis Date   Back pain 04/22/2012   Bone spur    Bulging disc 04/22/2012   Degenerative disc disease    Osteoarthritis    Panic anxiety syndrome    Stroke ([place]) 10/2020   Taking multiple medications for chronic disease    Past Surgical History:  Procedure Laterality Date   CYSTECTOMY     head   HERNIA REPAIR Left 2006,2014   [facility]   TOE SURGERY Right 2007   Patient Active Problem List   Diagnosis Date Noted   Alterations of sensations following cerebrovascular accident 03/08/2021   Aphasia as late effect of cerebrovascular accident (CVA) 03/08/2021   Muscle spasm 02/22/2021   Acute non-recurrent frontal sinusitis 02/22/2021   Chronic pain syndrome 01/23/2021   Primary hypertension 01/23/2021   Nerve pain 01/23/2021   Erectile dysfunction due to diseases classified elsewhere 01/23/2021   Cognitive dysfunction 01/23/2021   Scrotal edema 01/23/2021   Elevated LDL cholesterol level 01/23/2021   History of stroke with residual deficit 01/23/2021   Primary insomnia 01/23/2021   Mouth pain 01/23/2021   Laceration of right hand without foreign body    Closed compression fracture of L1 vertebra ([place]) 05/24/2016   Lumbar stenosis with neurogenic claudication 11/01/2015   Chronic bilateral low back pain with right-sided  sciatica 11/08/2014   Hx of hemorrhoids 11/08/2014   AAA (abdominal aortic aneurysm) ([place]) 09/22/2014   Chronic pain associated with significant psychosocial dysfunction 09/22/2014   Panic disorder 09/22/2014   AB (asthmatic bronchitis) 08/17/2014   Anxiety 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 08/17/2014   Backache 08/17/2014   Lumbosacral spondylosis without myelopathy 08/17/2014   Disorder of male genital organs 08/17/2014   Brash 08/17/2014   Low back pain 08/17/2014   Tendon nodule 08/17/2014   Episodic paroxysmal anxiety disorder 08/17/2014   Hernia, inguinal, right 08/17/2014   Fast heart beat 08/17/2014   Illness 08/17/2014   Inguinal hernia 10/14/2012    ONSET DATE: 11/09/2020  REFERRING DIAG: CVA  THERAPY DIAG:  Muscle weakness (generalized)  Other lack of coordination  Rationale for Evaluation and Treatment Rehabilitation  PERTINENT HISTORY: Pt. is a 51 y.o. who was diagnosed with a CVA on July 21st, 2022. Pt. completed several weeks of  inpatient rehab at [facility]. After returning to home, Pt. sustained a fall in december of 2022, and was admitted to the hospital with COVI-19, and back pain from the fall. Since the most recent discharge, Pt. has been residing with the ex-wife until the Pt. is ready to return to independent living. Pt. PMHx includes: Bilateral LBP with right sided sciatica, Lumbosacral spondylosis myelopathy, closed compression Fx of L1. Pt. enjoys cooking, and riding motorcycles.   PRECAUTIONS: Fall  SUBJECTIVE: Pt  reports he didn't sign in today because he was running late.  Reminded patient to always check in with the receptionist so she can arrive him so we can access his chart and know he is here.  Reports he is doing well, has some "usual back pain"  PAIN:  Are you having pain? Yes: NPRS scale: 3/10 Pain location: low back Pain description: aching Aggravating factors: prolonged sitting or lying down Relieving factors: rest, heat, walking       OBJECTIVE:  TODAY'S TREATMENT:   10/24/2021 Therapeutic Exercise: Pt seen for AAROM of RUE for shoulder flexion, ABD, ADD, ER, elbow flexion/extension, forearm pronation/supination (has some tightness here), wrist and hand prior to engagement into reaching tasks.  Pt seen this date with use of SAEBO looped ball tower to promote ROM, reaching and grasping patterns.  Pt requires some manual guiding from therapist for elbow extension, cues for forearm in neutral with reach.  Multiple trials performed with 3 point pinch pattern on loop then with full hand grasp with assist at thumb on ball portion.  Prolonged stretching at thumb and webspace with use of ball in hand.  Pt able to perform all 4 levels of reach from seated position with effort and guiding from therapist for facilitation of normal movement patterns.   Pt seen with use of Juxtaciser with emphasis on grasping patterns on cylindrical area, cues for wrist movements to move washer from one side of the tool to the other, pt with slow, deliberate movement patterns, 3 trials completed.  Pt required verbal cues for gripping patterns and occasional guiding for movement patterns to complete task.     PATIENT EDUCATION: Education details: HEP progression, reaching tasks with normal movement patterns Person educated: Patient Education method: Explanation Education comprehension: verbalized understanding and needs further education    OT Short Term Goals -      OT SHORT TERM GOAL #1   Title Pt. wil demonstrtae independence with HEPs    Baseline Eval: Pt. currently does not have one, 10th visit:  changes in HEP ongoing; 20th: instructed in putty exercises, changes ongoing    Time 6    Period Weeks    Status On-going    Target Date 09/20/21             OT Long Term Goals -       OT LONG TERM GOAL #1   Title Pt. will improve RUE strength by 2 mm grades to assist with ADLs, and IADLs,    Baseline Eval: Right shoulder flexion,  abduction 3/5, elbow flexion, extension wrist extension 3+/5, 10th visit: improving with RUE strength by not yet met goal; 20th visit: R shd flex/abd 4-, elbow flex/ext 4+, wrist flex 4-, wrist ext 4+    Time 12    Period Weeks    Status On-going    Target Date 11/01/21      OT LONG TERM GOAL #2   Title Pt. will improve right grip by 10 lbs to prepare for firmly holding objects for IADLs.    Baseline Eval: R: 38#, L: 124# (In 3rd dynamometer slot); R 35# in 4th slot, 28# in 3rd slot    Time 12    Period Weeks    Status On-going    Target Date 11/01/21      OT LONG TERM GOAL #3   Title Pt. will improve right lateral pinch strength by 5# to assist with cutting food    Baseline Eval: R: 17, L: 25; 20th:  R: 18    Time 12    Period Weeks    Status On-going    Target Date 11/01/21      OT LONG TERM GOAL #4   Title Pt. will improve Right 3pt pinch by 2# to be able to hold/open items for cooking    Baseline Eval: R: Pt. unable to engage thumb L: 29#; 20th: R unable    Time 12    Period Weeks    Status On-going    Target Date 11/01/21      OT LONG TERM GOAL #5   Title Pt. will improve right hand [facility] skills to be able to independently manipulate buttons, and zippers.    Baseline Eval: Pt. has difficulty managing buttons,a nd zippers. 10th visit:  improving; 20th: unable to engage R hand effectively, performs 1 handed with L    Time 12    Period Weeks    Status On-going    Target Date 11/01/21      OT LONG TERM GOAL #6   Title Pt. will independently write his name    Baseline Eval: Pt. is unable to hold a pen; 20th: pt can grip PenAgain with R hand and can make marks on paper with difficulty, but not yet writing name    Time 12    Period Weeks    Status On-going    Target Date 11/01/21      OT LONG TERM GOAL #7   Title Pt. will improve FOTO score by 2 points to reflect funational improvement    Baseline Eval: FOTO score 46 with TR score 52; 20th: 45    Time 12    Period Weeks     Status On-going    Target Date 11/01/21              Plan -    Clinical Impression Statement Pt pleased with his progress this date, he reports liking the first time use of the juxtaciser tool and would like to work with it again in the future, he felt it helped to work on wrist movement and coordination of movements while attempts to maintain gripping patterns.  Some tightness noted this date with supination,  he is able to demonstrate stretch for supination as a part of his home program.  Pt demonstrates difficulty with combined movements involving reach, grasp and coordination patterns but is progressing well and reports being motivated from the challenges of the task.  Continue to work towards goals in plan of care to maximize safety and independence in necessary daily tasks.    OT Occupational Profile and History Detailed Assessment- Review of Records and additional review of physical, cognitive, psychosocial history related to current functional performance    Occupational performance deficits (Please refer to evaluation for details): ADL's;IADL's;Education    Rehab Potential Good    Clinical Decision Making Several treatment options, min-mod task modification necessary    Comorbidities Affecting Occupational Performance: May have comorbidities impacting occupational performance    Modification or Assistance to Complete Evaluation  Min-Moderate modification of tasks or assist with assess necessary to complete eval    OT Frequency 2x / week    OT Duration 12 weeks    OT Treatment/Interventions Self-care/ADL training;DME and/or AE instruction;Therapeutic exercise;Ultrasound;Neuromuscular education;Therapeutic activities;Energy conservation;Moist Heat;Patient/family education;Splinting;Functional Mobility Training;Paraffin    Plan Neuro re-ed and therapeutic exercise    Consulted and Agree with Plan of Care Patient             Viet Kemmerer T  Leandrea Ackley, OTR/L, CLT  Anyeli Hockenbury, OT 10/26/2021,  8:52 AM

## 2021-10-26 NOTE — Therapy (Signed)
OUTPATIENT OCCUPATIONAL THERAPY TREATMENT NOTE   Patient Name: David Reeves MRN: [id number] DOB:[birth year], 51 y.o., male Today's Date: 10/26/2021  PCP: [name], PA-C REFERRING PROVIDER: [name], PA-C  END OF SESSION:   OT End of Session - 10/26/21 1812     Visit Number 39    Number of Visits 57    Date for OT Re-Evaluation 11/01/21    Authorization Type Progress reporting period starting 08/10/21    OT Start Time 0915    OT Stop Time 1000    OT Time Calculation (min) 45 min    Activity Tolerance Patient tolerated treatment well    Behavior During Therapy [facility] for tasks assessed/performed             Past Medical History:  Diagnosis Date   Back pain 04/22/2012   Bone spur    Bulging disc 04/22/2012   Degenerative disc disease    Osteoarthritis    Panic anxiety syndrome    Stroke ([place]) 10/2020   Taking multiple medications for chronic disease    Past Surgical History:  Procedure Laterality Date   CYSTECTOMY     head   HERNIA REPAIR Left 2006,2014   [facility]   TOE SURGERY Right 2007   Patient Active Problem List   Diagnosis Date Noted   Alterations of sensations following cerebrovascular accident 03/08/2021   Aphasia as late effect of cerebrovascular accident (CVA) 03/08/2021   Muscle spasm 02/22/2021   Acute non-recurrent frontal sinusitis 02/22/2021   Chronic pain syndrome 01/23/2021   Primary hypertension 01/23/2021   Nerve pain 01/23/2021   Erectile dysfunction due to diseases classified elsewhere 01/23/2021   Cognitive dysfunction 01/23/2021   Scrotal edema 01/23/2021   Elevated LDL cholesterol level 01/23/2021   History of stroke with residual deficit 01/23/2021   Primary insomnia 01/23/2021   Mouth pain 01/23/2021   Laceration of right hand without foreign body    Closed compression fracture of L1 vertebra ([place]) 05/24/2016   Lumbar stenosis with neurogenic claudication 11/01/2015   Chronic bilateral low back pain with right-sided  sciatica 11/08/2014   Hx of hemorrhoids 11/08/2014   AAA (abdominal aortic aneurysm) ([place]) 09/22/2014   Chronic pain associated with significant psychosocial dysfunction 09/22/2014   Panic disorder 09/22/2014   AB (asthmatic bronchitis) 08/17/2014   Anxiety 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 08/17/2014   Backache 08/17/2014   Lumbosacral spondylosis without myelopathy 08/17/2014   Disorder of male genital organs 08/17/2014   Brash 08/17/2014   Low back pain 08/17/2014   Tendon nodule 08/17/2014   Episodic paroxysmal anxiety disorder 08/17/2014   Hernia, inguinal, right 08/17/2014   Fast heart beat 08/17/2014   Illness 08/17/2014   Inguinal hernia 10/14/2012    ONSET DATE: 11/09/2020  REFERRING DIAG: CVA  THERAPY DIAG:  Muscle weakness (generalized)  Other lack of coordination  Rationale for Evaluation and Treatment Rehabilitation  PERTINENT HISTORY: Pt. is a 51 y.o. who was diagnosed with a CVA on July 21st, 2022. Pt. completed several weeks of  inpatient rehab at [facility]. After returning to home, Pt. sustained a fall in december of 2022, and was admitted to the hospital with COVI-19, and back pain from the fall. Since the most recent discharge, Pt. has been residing with the ex-wife until the Pt. is ready to return to independent living. Pt. PMHx includes: Bilateral LBP with right sided sciatica, Lumbosacral spondylosis myelopathy, closed compression Fx of L1. Pt. enjoys cooking, and riding motorcycles.   PRECAUTIONS: Fall  SUBJECTIVE: Pt  states he's looking forward to seeing his girlfriend this weekend.  PAIN:  Are you having pain? Yes: NPRS scale: 4/10 Pain location: low back Pain description: aching Aggravating factors: prolonged sitting or lying down Relieving factors: rest, heat, walking      OBJECTIVE:  TODAY'S TREATMENT:   10/26/2021  Self Care: Practiced digit extension in prep for grasping a styrofoam cup with R hand, and digit flexion working on  modulation of grasp.  Pt dropped the cup several times but more so d/t lack of coordination vs grip modulation.  Practiced hand to mouth patterns with cup in hand to simulate drinking, then setting cup back on table top.  Pt able to set cup back on table x1 trial without knocking cup over as pt lacks coordination with pron/sup of the forearm when hand is closed, which tipped the cup.    Therapeutic Exercise: Pt seen with use of Juxtaciser with emphasis on grasping patterns on cylindrical area, cues for wrist movements to move washer from one side of the tool to the other, pt with slow, deliberate movement patterns, 3 trials completed with rest between each set, with pt stating it was exhausting.  Pt required verbal cues for gripping patterns and occasional guiding for movement patterns to complete task.  Facilitated lateral pinch strengthening with resistive yellow clothespin x3 sets 10 reps, working to avoid slipping of thumb.  Pt required OT assist to set up clip in hand and had only a few slips within his 30 reps.  Pt practiced combined movements with Dimock board for gripping with wrist ext and wrist flexion to move manipulative with wide wooden handle up and down the board.  Pt required cues for movement patterns and mod A to keep manipulative level and moving on velcro.      PATIENT EDUCATION: Education details: HEP progression Person educated: Patient Education method: Explanation Education comprehension: verbalized understanding and needs further education    OT Short Term Goals -      OT SHORT TERM GOAL #1   Title Pt. wil demonstrtae independence with HEPs    Baseline Eval: Pt. currently does not have one, 10th visit:  changes in HEP ongoing; 20th: instructed in putty exercises, changes ongoing    Time 6    Period Weeks    Status On-going    Target Date 09/20/21             OT Long Term Goals -       OT LONG TERM GOAL #1   Title Pt. will improve RUE strength by 2 mm grades to  assist with ADLs, and IADLs,    Baseline Eval: Right shoulder flexion, abduction 3/5, elbow flexion, extension wrist extension 3+/5, 10th visit: improving with RUE strength by not yet met goal; 20th visit: R shd flex/abd 4-, elbow flex/ext 4+, wrist flex 4-, wrist ext 4+    Time 12    Period Weeks    Status On-going    Target Date 11/01/21      OT LONG TERM GOAL #2   Title Pt. will improve right grip by 10 lbs to prepare for firmly holding objects for IADLs.    Baseline Eval: R: 38#, L: 124# (In 3rd dynamometer slot); R 35# in 4th slot, 28# in 3rd slot    Time 12    Period Weeks    Status On-going    Target Date 11/01/21      OT LONG TERM GOAL #3   Title Pt. will improve  right lateral pinch strength by 5# to assist with cutting food    Baseline Eval: R: 17, L: 25; 20th: R: 18    Time 12    Period Weeks    Status On-going    Target Date 11/01/21      OT LONG TERM GOAL #4   Title Pt. will improve Right 3pt pinch by 2# to be able to hold/open items for cooking    Baseline Eval: R: Pt. unable to engage thumb L: 29#; 20th: R unable    Time 12    Period Weeks    Status On-going    Target Date 11/01/21      OT LONG TERM GOAL #5   Title Pt. will improve right hand [facility] skills to be able to independently manipulate buttons, and zippers.    Baseline Eval: Pt. has difficulty managing buttons,a nd zippers. 10th visit:  improving; 20th: unable to engage R hand effectively, performs 1 handed with L    Time 12    Period Weeks    Status On-going    Target Date 11/01/21      OT LONG TERM GOAL #6   Title Pt. will independently write his name    Baseline Eval: Pt. is unable to hold a pen; 20th: pt can grip PenAgain with R hand and can make marks on paper with difficulty, but not yet writing name    Time 12    Period Weeks    Status On-going    Target Date 11/01/21      OT LONG TERM GOAL #7   Title Pt. will improve FOTO score by 2 points to reflect funational improvement    Baseline Eval:  FOTO score 46 with TR score 52; 20th: 45    Time 12    Period Weeks    Status On-going    Target Date 11/01/21              Plan -    Clinical Impression Statement Practiced grasp/release of styrofoam cup in R hand, and hand to mouth patterns with empty cup in hand.  Pt required min A to set up cup in hand, and struggled to release cup level on table top without cup tipping d/t lack of coordination with wrist and forearm mobility.  Pt continues to be pleased with his progress this date.  Pt was eager to use the Juxtaciser again as he felt it helped to work on wrist movement and coordination of movements while attempts to maintain gripping patterns.  Pt requires rest breaks between reps of Juxtaciser d/t the extreme effort it requires to maintain grip while moving wrist and forearm in his desired way.  Pt continues to demonstrate difficulty with combined movements involving reach, grasp and coordination patterns but is progressing well and continues to be motivated from the challenges of the task.  Continue to work towards goals in plan of care to maximize safety and independence in necessary daily tasks.    OT Occupational Profile and History Detailed Assessment- Review of Records and additional review of physical, cognitive, psychosocial history related to current functional performance    Occupational performance deficits (Please refer to evaluation for details): ADL's;IADL's;Education    Rehab Potential Good    Clinical Decision Making Several treatment options, min-mod task modification necessary    Comorbidities Affecting Occupational Performance: May have comorbidities impacting occupational performance    Modification or Assistance to Complete Evaluation  Min-Moderate modification of tasks or assist with assess necessary to  complete eval    OT Frequency 2x / week    OT Duration 12 weeks    OT Treatment/Interventions Self-care/ADL training;DME and/or AE instruction;Therapeutic  exercise;Ultrasound;Neuromuscular education;Therapeutic activities;Energy conservation;Moist Heat;Patient/family education;Splinting;Functional Mobility Training;Paraffin    Plan Neuro re-ed and therapeutic exercise    Consulted and Agree with Plan of Care Patient             Leta Speller, MS, OTR/L  Darleene Cleaver, OT 10/26/2021, 6:32 PM

## 2021-10-30 ENCOUNTER — Telehealth: Payer: Self-pay

## 2021-10-30 ENCOUNTER — Ambulatory Visit: Payer: Medicare Other

## 2021-10-30 ENCOUNTER — Encounter: Payer: Medicare HMO | Admitting: Speech Pathology

## 2021-10-30 NOTE — Telephone Encounter (Signed)
PT called pt over secure line due to missed visit this morning. Pt reported he had cancelled his OT and PT appointments ahead of time online. Pt aware of next visit date/time.   Temple Pacini PT, DPT

## 2021-11-02 ENCOUNTER — Encounter: Payer: Medicare HMO | Admitting: Speech Pathology

## 2021-11-02 ENCOUNTER — Ambulatory Visit: Payer: Medicare Other

## 2021-11-02 ENCOUNTER — Ambulatory Visit: Payer: Medicare Other | Admitting: Occupational Therapy

## 2021-11-02 DIAGNOSIS — M545 Low back pain, unspecified: Secondary | ICD-10-CM

## 2021-11-02 DIAGNOSIS — R278 Other lack of coordination: Secondary | ICD-10-CM

## 2021-11-02 DIAGNOSIS — M6281 Muscle weakness (generalized): Secondary | ICD-10-CM | POA: Diagnosis not present
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
## 2021-11-03 ENCOUNTER — Encounter: Payer: Self-pay | Admitting: Occupational Therapy

## 2021-11-03 NOTE — Therapy (Signed)
OUTPATIENT OCCUPATIONAL THERAPY TREATMENT NOTE/PROGRESS UPDATE/RECERTIFICATION REPORTING PERIOD FROM 09/19/2021 TO 11/02/2021   Patient Name: David Reeves MRN: [id number] DOB:1970/09/03, 51 y.o., male Today's Date: 11/05/2021  PCP: [name], PA-C REFERRING PROVIDER: [name], PA-C  END OF SESSION:         OT End of Session - 11/03/21 2138       Visit Number 40     Number of Visits 2     Date for OT Re-Evaluation 01/25/22     Authorization Type Progress reporting period starting 11/02/2021     OT Start Time 1015     OT Stop Time 1101     OT Time Calculation (min) 46 min     Activity Tolerance Patient tolerated treatment well     Behavior During Therapy [facility] for tasks assessed/performed         Past Medical History:  Diagnosis Date   Back pain 04/22/2012   Bone spur    Bulging disc 04/22/2012   Degenerative disc disease    Osteoarthritis    Panic anxiety syndrome    Stroke ([place]) 10/2020   Taking multiple medications for chronic disease    Past Surgical History:  Procedure Laterality Date   CYSTECTOMY     head   HERNIA REPAIR Left 2006,2014   [facility]   TOE SURGERY Right 2007   Patient Active Problem List   Diagnosis Date Noted   Alterations of sensations following cerebrovascular accident 03/08/2021   Aphasia as late effect of cerebrovascular accident (CVA) 03/08/2021   Muscle spasm 02/22/2021   Acute non-recurrent frontal sinusitis 02/22/2021   Chronic pain syndrome 01/23/2021   Primary hypertension 01/23/2021   Nerve pain 01/23/2021   Erectile dysfunction due to diseases classified elsewhere 01/23/2021   Cognitive dysfunction 01/23/2021   Scrotal edema 01/23/2021   Elevated LDL cholesterol level 01/23/2021   History of stroke with residual deficit 01/23/2021   Primary insomnia 01/23/2021   Mouth pain 01/23/2021   Laceration of right hand without foreign body    Closed compression fracture of L1 vertebra ([place]) 05/24/2016   Lumbar  stenosis with neurogenic claudication 11/01/2015   Chronic 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 11/08/2014   Hx of hemorrhoids 11/08/2014   AAA (abdominal aortic aneurysm) ([place]) 09/22/2014   Chronic pain associated with significant psychosocial dysfunction 09/22/2014   Panic disorder 09/22/2014   AB (asthmatic bronchitis) 08/17/2014   Anxiety 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 08/17/2014   Backache 08/17/2014   Lumbosacral spondylosis without myelopathy 08/17/2014   Disorder of male genital organs 08/17/2014   Brash 08/17/2014   Low back pain 08/17/2014   Tendon nodule 08/17/2014   Episodic paroxysmal anxiety disorder 08/17/2014   Hernia, inguinal, right 08/17/2014   Fast heart beat 08/17/2014   Illness 08/17/2014   Inguinal hernia 10/14/2012    ONSET DATE: 11/09/2020  REFERRING DIAG: CVA  THERAPY DIAG:  Muscle weakness (generalized)  Other lack of coordination  Rationale for Evaluation and Treatment Rehabilitation  PERTINENT HISTORY: Pt. is a 51 y.o. who was diagnosed with a CVA on July 21st, 2022. Pt. completed several weeks of  inpatient rehab at [facility]. After returning to home, Pt. sustained a fall in december of 2022, and was admitted to the hospital with COVI-19, and back pain from the fall. Since the most recent discharge, Pt. has been residing with the ex-wife until the Pt. is ready to return to independent living. Pt. PMHx includes: Bilateral LBP with right sided sciatica, Lumbosacral spondylosis  myelopathy, closed compression Fx of L1. Pt. enjoys cooking, and riding motorcycles.   PRECAUTIONS: Fall  SUBJECTIVE:   PAIN:  Are you having pain? Yes: NPRS scale: 4/10 Pain location: low back Pain description: aching Aggravating factors: prolonged sitting or lying down Relieving factors: rest, heat, walking      OBJECTIVE:  TODAY'S TREATMENT:     [facility] OT Assessment      Observation/Other Assessments   Focus on Therapeutic Outcomes (FOTO)  44       Coordination   Right 9 Hole Peg Test unable    Left 9 Hole Peg Test 30 sec      Hand Function   Right Hand Grip (lbs) 47    Right Hand Lateral Pinch 15 lbs   slipping   Right Hand 3 Point Pinch --   unable to position thumb to produce 3 pt pinch   Left Hand Grip (lbs) 116    Left Hand Lateral Pinch 29 lbs    Left 3 point pinch 27 lbs             Neuromuscular Reeducation: Attempted 9 hole peg test on right however patient still unable to complete Pt continues to demonstrate difficulty with oppositional movements of the right thumb with prehension patterns which is affecting his grasping and 3 point pinch patterns.  Thumb has decreased rotation of movement. Instructed on use of tennis ball in right hand to promote increased web space/stretch as well as positioning of thumb on ball, rotated and in opposition.  Pt instructed to move thumb across and around ball as part of home program over the next few days.    Therapeutic Exercise: Reassessment of hand skills, see flow sheet above for details as well as the following: Right shoulder flexion 140 degrees, after cues he was able to achieve 160 with effort Elbow flexion 132 degrees, extension to 0.   Supination to 70 degrees, pronation WNL Able to produce finger ABD but demonstrates difficulty with finger ADD with increased spasticity present and finger flexion with attempts Thumb radial ABD to 40, full ADD Palmar ABD with difficulty.   Able to produce full fisting motion but thumb tends to tuck in.  Opposition of thumb to index finger and lateral aspect of middle finger.     PATIENT EDUCATION: Education details: HEP progression Person educated: Patient Education method: Explanation Education comprehension: verbalized understanding and needs further education    OT Short Term Goals -      OT SHORT TERM GOAL #1   Title Pt. wil demonstrtae independence with HEPs    Baseline Eval: Pt. currently does not have one, 10th visit:   changes in HEP ongoing; 20th: instructed in putty exercises, changes ongoing; 40th: continual updates to HEP as pt progresses   Time 6    Period Weeks    Status On-going    Target Date 11/20/21             OT Long Term Goals -       OT LONG TERM GOAL #1   Title Pt. will improve RUE strength by 2 mm grades to assist with ADLs, and IADLs,    Baseline Eval: Right shoulder flexion, abduction 3/5, elbow flexion, extension wrist extension 3+/5, 10th visit: improving with RUE strength by not yet met goal; 20th visit: R shd flex/abd 4-, elbow flex/ext 4+, wrist flex 4-, wrist ext 4+; 40:  R shd flex/abd 4,  elbow flex/ext 4+, wrist flex 4, wrist ext 4+  Time 12    Period Weeks    Status On-going    Target Date 01/25/22      OT LONG TERM GOAL #2   Title Pt. will improve right grip by 10 lbs to prepare for firmly holding objects for IADLs.    Baseline Eval: R: 38#, L: 124# (In 3rd dynamometer slot); R 35# in 4th slot, 28# in 3rd slot, 40th: 47 in 4th slot   Time 12    Period Weeks    Status On-going    Target Date 01/25/22      OT LONG TERM GOAL #3   Title Pt. will improve right lateral pinch strength by 5# to assist with cutting food    Baseline Eval: R: 17, L: 25; 20th: R: 18; 40th: 15# fingers slipping on meter   Time 12    Period Weeks    Status On-going    Target Date 01/25/22      OT LONG TERM GOAL #4   Title Pt. will improve Right 3pt pinch by 2# to be able to hold/open items for cooking    Baseline Eval: R: Pt. unable to engage thumb L: 29#; 20th: R unable; 40th:  unable, fingers slipping   Time 12    Period Weeks    Status On-going    Target Date 01/25/22      OT LONG TERM GOAL #5   Title Pt. will improve right hand [facility] skills to be able to independently manipulate buttons, and zippers.    Baseline Eval: Pt. has difficulty managing buttons,a nd zippers. 10th visit:  improving; 20th: unable to engage R hand effectively, performs 1 handed with L    Time 12    Period  Weeks    Status On-going    Target Date 01/25/22      OT LONG TERM GOAL #6   Title Pt. will independently write his name    Baseline Eval: Pt. is unable to hold a pen; 20th: pt can grip PenAgain with R hand and can make marks on paper with difficulty, but not yet writing name; 40th:  continues to demonstrate difficulty with grasp of pen   Time 12    Period Weeks    Status On-going    Target Date 01/25/22      OT LONG TERM GOAL #7   Title Pt. will improve FOTO score by 2 points to reflect funational improvement    Baseline Eval: FOTO score 46 with TR score 52; 20th: 45; 40th:    Time 12    Period Weeks    Status On-going    Target Date 01/25/22              Plan -    Clinical Impression Statement Pt continues to make good progress towards goals, shoulder motion improved for reaching, right hand demonstrating improvements in grasping patterns, thumb ROM and movement as well as grip and pinch patterns.  Pt demonstrating increased independence in ADL and IADL tasks.  Continues to demonstrate limitations in right thumb functional movement and oppositional movement patterns with grasping and has difficulty with 3 point pinch patterns to pick up objects and with select tool use for ADL tasks.  He remains focused and consistent with his home exercises and continues to benefit from skilled OT services to maximize his safety and independence in necessary daily tasks.  Will continue to work towards neuro reed of right hand with emphasis on thumb movements to integrate into functional grasping and manipulation  skills.    OT Occupational Profile and History Detailed Assessment- Review of Records and additional review of physical, cognitive, psychosocial history related to current functional performance    Occupational performance deficits (Please refer to evaluation for details): ADL's;IADL's;Education    Rehab Potential Good    Clinical Decision Making Several treatment options, min-mod task  modification necessary    Comorbidities Affecting Occupational Performance: May have comorbidities impacting occupational performance    Modification or Assistance to Complete Evaluation  Min-Moderate modification of tasks or assist with assess necessary to complete eval    OT Frequency 2x / week    OT Duration 12 weeks    OT Treatment/Interventions Self-care/ADL training;DME and/or AE instruction;Therapeutic exercise;Ultrasound;Neuromuscular education;Therapeutic activities;Energy conservation;Moist Heat;Patient/family education;Splinting;Functional Mobility Training;Paraffin    Plan Neuro re-ed and therapeutic exercise    Consulted and Agree with Plan of Care Patient             Wylie Russon Oneita Jolly, OTR/L, CLT   Shaden Higley, OT 11/05/2021, 3:00 PM

## 2021-11-05 NOTE — Addendum Note (Signed)
Addended by: Derrek Gu T on: 11/05/2021 03:46 PM   Modules accepted: Orders

## 2021-11-06 ENCOUNTER — Ambulatory Visit: Payer: Medicare Other

## 2021-11-06 ENCOUNTER — Encounter: Payer: Medicare HMO | Admitting: Speech Pathology

## 2021-11-06 DIAGNOSIS — R278 Other lack of coordination: Secondary | ICD-10-CM

## 2021-11-06 DIAGNOSIS — M545 Low back pain, unspecified: Secondary | ICD-10-CM

## 2021-11-06 DIAGNOSIS — M6281 Muscle weakness (generalized): Secondary | ICD-10-CM | POA: Diagnosis not present

## 2021-11-06 DIAGNOSIS — Z8673 Personal history of transient ischemic attack (TIA), and cerebral infarction without residual deficits: Secondary | ICD-10-CM

## 2021-11-06 NOTE — Therapy (Addendum)
OUTPATIENT OCCUPATIONAL THERAPY TREATMENT NOTE  Patient Name: David Reeves MRN: [id number] DOB:12-31-1970, 51 y.o., male Today's Date: 11/06/2021  PCP: Alfredia Ferguson, PA-C REFERRING PROVIDER: Alfredia Ferguson, PA-C  END OF SESSION:     OT End of Session - 11/06/21 1606     Visit Number 41    Number of Visits 64    Date for OT Re-Evaluation 01/25/22    Authorization Type Progress reporting period starting 11/02/2021    OT Start Time 0915    OT Stop Time 1000    OT Time Calculation (min) 45 min    Activity Tolerance Patient tolerated treatment well    Behavior During Therapy [facility] for tasks assessed/performed               OT End of Session - 11/06/21 1606     Visit Number 41    Number of Visits 64    Date for OT Re-Evaluation 01/25/22    Authorization Type Progress reporting period starting 11/02/2021    OT Start Time 0915    OT Stop Time 1000    OT Time Calculation (min) 45 min    Activity Tolerance Patient tolerated treatment well    Behavior During Therapy [facility] for tasks assessed/performed             OT End of Session - 11/06/21 1606     Visit Number 41    Number of Visits 64    Date for OT Re-Evaluation 01/25/22    Authorization Type Progress reporting period starting 11/02/2021    OT Start Time 0915    OT Stop Time 1000    OT Time Calculation (min) 45 min    Activity Tolerance Patient tolerated treatment well    Behavior During Therapy [facility] for tasks assessed/performed              OT End of Session - 11/06/21 1606     Visit Number 41    Number of Visits 64    Date for OT Re-Evaluation 01/25/22    Authorization Type Progress reporting period starting 11/02/2021    OT Start Time 0915    OT Stop Time 1000    OT Time Calculation (min) 45 min    Activity Tolerance Patient tolerated treatment well    Behavior During Therapy [facility] for tasks assessed/performed             OT End of Session - 11/06/21 1606     Visit Number 41    Number  of Visits 64    Date for OT Re-Evaluation 01/25/22    Authorization Type Progress reporting period starting 11/02/2021    OT Start Time 0915    OT Stop Time 1000    OT Time Calculation (min) 45 min    Activity Tolerance Patient tolerated treatment well    Behavior During Therapy [facility] for tasks assessed/performed              OT End of Session - 11/03/21 2138       Visit Number 40     Number of Visits 64     Date for OT Re-Evaluation 01/25/22     Authorization Type Progress reporting period starting 11/02/2021     OT Start Time 1015     OT Stop Time 1101     OT Time Calculation (min) 46 min     Activity Tolerance Patient tolerated treatment well     Behavior During Therapy [facility] for tasks assessed/performed  Past Medical History:  Diagnosis Date   Back pain 04/22/2012   Bone spur    Bulging disc 04/22/2012   Degenerative disc disease    Osteoarthritis    Panic anxiety syndrome    Stroke ([place]) 10/2020   Taking multiple medications for chronic disease    Past Surgical History:  Procedure Laterality Date   CYSTECTOMY     head   HERNIA REPAIR Left 2006,2014   [facility]   TOE SURGERY Right 2007   Patient Active Problem List   Diagnosis Date Noted   Alterations of sensations following cerebrovascular accident 03/08/2021   Aphasia as late effect of cerebrovascular accident (CVA) 03/08/2021   Muscle spasm 02/22/2021   Acute non-recurrent frontal sinusitis 02/22/2021   Chronic pain syndrome 01/23/2021   Primary hypertension 01/23/2021   Nerve pain 01/23/2021   Erectile dysfunction due to diseases classified elsewhere 01/23/2021   Cognitive dysfunction 01/23/2021   Scrotal edema 01/23/2021   Elevated LDL cholesterol level 01/23/2021   History of stroke with residual deficit 01/23/2021   Primary insomnia 01/23/2021   Mouth pain 01/23/2021   Laceration of right hand without foreign body    Closed compression fracture of L1 vertebra ([place]) 05/24/2016   Lumbar  stenosis with neurogenic claudication 11/01/2015   Chronic 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 11/08/2014   Hx of hemorrhoids 11/08/2014   AAA (abdominal aortic aneurysm) ([place]) 09/22/2014   Chronic pain associated with significant psychosocial dysfunction 09/22/2014   Panic disorder 09/22/2014   AB (asthmatic bronchitis) 08/17/2014   Anxiety 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 08/17/2014   Backache 08/17/2014   Lumbosacral spondylosis without myelopathy 08/17/2014   Disorder of male genital organs 08/17/2014   Brash 08/17/2014   Low back pain 08/17/2014   Tendon nodule 08/17/2014   Episodic paroxysmal anxiety disorder 08/17/2014   Hernia, inguinal, right 08/17/2014   Fast heart beat 08/17/2014   Illness 08/17/2014   Inguinal hernia 10/14/2012    ONSET DATE: 11/09/2020  REFERRING DIAG: CVA  THERAPY DIAG:  Muscle weakness (generalized)  Other lack of coordination  H/O ischemic left MCA stroke  Rationale for Evaluation and Treatment Rehabilitation  PERTINENT HISTORY: Pt. is a 51 y.o. who was diagnosed with a CVA on July 21st, 2022. Pt. completed several weeks of  inpatient rehab at [facility]. After returning to home, Pt. sustained a fall in december of 2022, and was admitted to the hospital with COVI-19, and back pain from the fall. Since the most recent discharge, Pt. has been residing with the ex-wife until the Pt. is ready to return to independent living. Pt. PMHx includes: Bilateral LBP with right sided sciatica, Lumbosacral spondylosis myelopathy, closed compression Fx of L1. Pt. enjoys cooking, and riding motorcycles.   PRECAUTIONS: Fall  SUBJECTIVE: Pt reports being in pain, "as usual."  PAIN:  Are you having pain? Yes: NPRS scale: 4/10 Pain location: low back, L shoulder Pain description: aching Aggravating factors: prolonged sitting or lying down, excessive use of LUE Relieving factors: rest, heat, walking   OBJECTIVE:  TODAY'S TREATMENT:     Neuromuscular Reeducation: Reviewed/completed R palmar abd and add gliding thumb over tennis ball.  Pt requires active assist to move thumb into palmar abduction and reinforcement to provide the least amount of assist necessary to complete the movement.  Practiced grasp/release of tennis ball to carry over palmar abd/add movements and sustained grasp of tennis ball during gross motor movement from the shoulder, elbow, forearm, and wrist.  Pt struggles to maintain  grip of ball during supination and wrist flexion.  Practiced maintaining grasp of ball with active wrist flexion with forearm pronated x2 sets 10 reps with noted increase in wrist flexion just beyond neutral (previous attempts pt unable to move beyond neutral d/t spasticity).  Practiced tool manipulation with grasp patterns that encouraged palmar abduction, and working to target wrist and forearm movement while maintaining grasp with the tool.  Pt struggled to use tool with force with attempts at pushing tools into putty on table top, so targeted wrist and forearm AROM with closed hand around the tool without dropping.  Verbal and tactile cues to isolate distal UE movements by resting arm on table top to minimize shoulder hiking and abduction.    PATIENT EDUCATION: Education details: HEP progression: tennis ball grasp/release, palmar abd/add glides on tennis ball, and gross motor movements working to maintain grip of tennis ball Person educated: Patient Education method: Explanation Education comprehension: verbalized and demonstrated understanding.      OT Short Term Goals -      OT SHORT TERM GOAL #1   Title Pt. wil demonstrtae independence with HEPs    Baseline Eval: Pt. currently does not have one, 10th visit:  changes in HEP ongoing; 20th: instructed in putty exercises, changes ongoing; 40th: continual updates to HEP as pt progresses   Time 6    Period Weeks    Status On-going    Target Date 11/20/21             OT Long Term  Goals -       OT LONG TERM GOAL #1   Title Pt. will improve RUE strength by 2 mm grades to assist with ADLs, and IADLs,    Baseline Eval: Right shoulder flexion, abduction 3/5, elbow flexion, extension wrist extension 3+/5, 10th visit: improving with RUE strength by not yet met goal; 20th visit: R shd flex/abd 4-, elbow flex/ext 4+, wrist flex 4-, wrist ext 4+; 40:  R shd flex/abd 4,  elbow flex/ext 4+, wrist flex 4, wrist ext 4+   Time 12    Period Weeks    Status On-going    Target Date 01/25/22      OT LONG TERM GOAL #2   Title Pt. will improve right grip by 10 lbs to prepare for firmly holding objects for IADLs.    Baseline Eval: R: 38#, L: 124# (In 3rd dynamometer slot); R 35# in 4th slot, 28# in 3rd slot, 40th: 47 in 4th slot   Time 12    Period Weeks    Status On-going    Target Date 01/25/22      OT LONG TERM GOAL #3   Title Pt. will improve right lateral pinch strength by 5# to assist with cutting food    Baseline Eval: R: 17, L: 25; 20th: R: 18; 40th: 15# fingers slipping on meter   Time 12    Period Weeks    Status On-going    Target Date 01/25/22      OT LONG TERM GOAL #4   Title Pt. will improve Right 3pt pinch by 2# to be able to hold/open items for cooking    Baseline Eval: R: Pt. unable to engage thumb L: 29#; 20th: R unable; 40th:  unable, fingers slipping   Time 12    Period Weeks    Status On-going    Target Date 01/25/22      OT LONG TERM GOAL #5   Title Pt. will improve right hand  [facility] skills to be able to independently manipulate buttons, and zippers.    Baseline Eval: Pt. has difficulty managing buttons,a nd zippers. 10th visit:  improving; 20th: unable to engage R hand effectively, performs 1 handed with L    Time 12    Period Weeks    Status On-going    Target Date 01/25/22      OT LONG TERM GOAL #6   Title Pt. will independently write his name    Baseline Eval: Pt. is unable to hold a pen; 20th: pt can grip PenAgain with R hand and can make marks  on paper with difficulty, but not yet writing name; 40th:  continues to demonstrate difficulty with grasp of pen   Time 12    Period Weeks    Status On-going    Target Date 01/25/22      OT LONG TERM GOAL #7   Title Pt. will improve FOTO score by 2 points to reflect funational improvement    Baseline Eval: FOTO score 46 with TR score 52; 20th: 45; 40th:    Time 12    Period Weeks    Status On-going    Target Date 01/25/22              Plan -    Clinical Impression Statement Pt progressing with grasp/release of tennis ball with R hand, noting less thumb ext and more rotation/palmar abd.  Pt tolerated all exercises well this date with frequent rest breaks.  Pt continues to work towards isolated movements of the forearm, wrist, and hand as spasticity and motor planning continue to limit efficiency with these movements.  He remains focused and consistent with his home exercises, and continues to progress HEP to further challenge RUE.  Pt continues to benefit from skilled OT services to maximize his safety and independence in necessary daily tasks.  Will continue to work towards neuro reed of right hand with emphasis on thumb movements to integrate into functional grasping and manipulation skills.    OT Occupational Profile and History Detailed Assessment- Review of Records and additional review of physical, cognitive, psychosocial history related to current functional performance    Occupational performance deficits (Please refer to evaluation for details): ADL's;IADL's;Education    Rehab Potential Good    Clinical Decision Making Several treatment options, min-mod task modification necessary    Comorbidities Affecting Occupational Performance: May have comorbidities impacting occupational performance    Modification or Assistance to Complete Evaluation  Min-Moderate modification of tasks or assist with assess necessary to complete eval    OT Frequency 2x / week    OT Duration 12 weeks     OT Treatment/Interventions Self-care/ADL training;DME and/or AE instruction;Therapeutic exercise;Ultrasound;Neuromuscular education;Therapeutic activities;Energy conservation;Moist Heat;Patient/family education;Splinting;Functional Mobility Training;Paraffin    Plan Neuro re-ed and therapeutic exercise    Consulted and Agree with Plan of Care Patient             Leta Speller, MS, OTR/L  Darleene Cleaver, OT 11/06/2021, 4:12 PM

## 2021-11-06 NOTE — Therapy (Signed)
[place] MAIN [facility] SERVICES [address] [place], [place], [address] Phone: [phone]   Fax:  [phone]  Patient Details  Name: David Reeves MRN: [id number] Date of Birth: 12-11-1970 Referring Provider:  [name], PA-C  Encounter Date: 11/06/2021   OUTPATIENT PHYSICAL THERAPY TREATMENT NOTE     Patient Name: David Reeves MRN: [id number] DOB:10-Sep-1970, 51 y.o., male Today's Date: 11/06/2021  PCP: [name], PA-C REFERRING PROVIDER: [name], PA-C   PT End of Session - 11/06/21 1002     Visit Number 33    Number of Visits 41    Date for PT Re-Evaluation 11/12/21    Authorization Type [employer]- authorized 16 visits 4/4-6/1    Authorization - Visit Number 5    Authorization - Number of Visits 16    Progress Note Due on Visit 40    PT Start Time 1019    PT Stop Time 1100    PT Time Calculation (min) 41 min    Activity Tolerance Patient tolerated treatment well;No increased pain    Behavior During Therapy [facility] for tasks assessed/performed                  Past Medical History:  Diagnosis Date   Back pain 04/22/2012   Bone spur    Bulging disc 04/22/2012   Degenerative disc disease    Osteoarthritis    Panic anxiety syndrome    Stroke ([place]) 10/2020   Taking multiple medications for chronic disease    Past Surgical History:  Procedure Laterality Date   CYSTECTOMY     head   HERNIA REPAIR Left 2006,2014   [facility]   TOE SURGERY Right 2007   Patient Active Problem List   Diagnosis Date Noted   Alterations of sensations following cerebrovascular accident 03/08/2021   Aphasia as late effect of cerebrovascular accident (CVA) 03/08/2021   Muscle spasm 02/22/2021   Acute non-recurrent frontal sinusitis 02/22/2021   Chronic pain syndrome 01/23/2021   Primary hypertension 01/23/2021   Nerve pain 01/23/2021   Erectile dysfunction due to diseases classified elsewhere 01/23/2021    Cognitive dysfunction 01/23/2021   Scrotal edema 01/23/2021   Elevated LDL cholesterol level 01/23/2021   History of stroke with residual deficit 01/23/2021   Primary insomnia 01/23/2021   Mouth pain 01/23/2021   Laceration of right hand without foreign body    Closed compression fracture of L1 vertebra ([place]) 05/24/2016   Lumbar stenosis with neurogenic claudication 11/01/2015   Chronic 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 11/08/2014   Hx of hemorrhoids 11/08/2014   AAA (abdominal aortic aneurysm) ([place]) 09/22/2014   Chronic pain associated with significant psychosocial dysfunction 09/22/2014   Panic disorder 09/22/2014   AB (asthmatic bronchitis) 08/17/2014   Anxiety 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 08/17/2014   Backache 08/17/2014   Lumbosacral spondylosis without myelopathy 08/17/2014   Disorder of male genital organs 08/17/2014   Brash 08/17/2014   Low back pain 08/17/2014   Tendon nodule 08/17/2014   Episodic paroxysmal anxiety disorder 08/17/2014   Hernia, inguinal, right 08/17/2014   Fast heart beat 08/17/2014   Illness 08/17/2014   Inguinal hernia 10/14/2012    REFERRING DIAG: History of stroke with residual deficit, chronic 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, Lumbosacral spondylosis without myelopathy THERAPY DIAG:  Chronic bilateral low back pain without sciatica  Muscle weakness (generalized)  Rationale for Evaluation and Treatment Rehabilitation  PERTINENT HISTORY: Pt. is a 51 y.o. who was  diagnosed with a CVA on July 21st, 2022. Pt. completed several weeks of inpatient rehab at [facility]. After returning to home. He was discharged in late August/early September and recieved home health PT. Pt. sustained a fall in december of 2022, and was admitted to the hospital with COVID-19, and Chronic back pain. He reports chronic back pain syndrome since 2012. Since the most recent discharge, Pt. has been residing with the ex-wife until the Pt.  is ready to return to independent living. He did recieve 2 weeks of home health after discharge in December 2022. He is now being referred to outpatient PT to address weakness from stroke and improve fine motor movement. He reports rarely getting numbness/tingling in RLE. PMHx includes: Bilateral LBP with right sided sciatica, Lumbosacral spondylosis myelopathy, closed compression Fx of L1. Has chronic insomnia. osteoarthritis, Panic anxiety syndrome (taking medication) Pt. enjoys cooking, and riding motorcycles. Patient is going to [facility] spine center for Epidural spinal injections on 06/07/21;  PRECAUTIONS: Fall, Spinal Brace wears daily, approximately 25%, when exercising; semi-rigid lumbar brace  SUBJECTIVE: 3/10 pain currently. No changes. No stumbles/falls or other concerns.   PAIN:  Are you having pain? Yes: NPRS scale: 3/10 Pain location: bilat low back Pain description: dull ache; always constant,  Aggravating factors: worse with sitting,  Relieving factors: heat helps temporarily, pain pills/pain patch   TODAY'S TREATMENT:     11/06/2021   Pt supine on plinth with heat donned to pt's low back while pt performs interventions. No adverse reaction to treatment. Pt reports heat feels good.   Bridges: 1x15 with 2 sec holds  Progressed to BLE on stability ball 10x with 2 sec holds. No increased pain with intervention.  Green stability ball hamstring curls 20x  LTR: 20x   Single knee to chest: 5 x 10 sec hold each LE  SLR: 2x10 B @ 5# AW.  Rates medium-hard     Side-lying hip abduction 2 x 10 each side, 5# AW. Rates hard due to coordination. Reports intervention is fatiguing.  Dead bug progression with PPT,TrA activation with alt LE march 20x, performed slowly   PATIENT EDUCATION: Education details: exercise technique, body mechanics, exercise progression Person educated: Patient Education method: Explanation, Demonstration, Tactile cues, and Verbal cues Education  comprehension: verbalized understanding, returned demonstration, verbal cues required, tactile cues required, and needs further education   HOME EXERCISE PROGRAM: No updates as of 11/06/21  Continue as previously given   PT Short Term Goals -       PT SHORT TERM GOAL #1   Title Patient will be adherent to HEP at least 3x a week to improve functional strength and balance for better safety at home.    Baseline 4/4: doing them 1-2x a week; 08/20/2021=Patient reports performing his low back stretching and no questions currently HEP. 10/24/21: doing HEP 4x a week   Time 4    Period Weeks    Status Achieved    Target Date 08/21/21      PT SHORT TERM GOAL #2   Title Patient (< 50 years old) will complete five times sit to stand test in < 15 seconds indicating an increased LE strength and improved balance.    Baseline 4/4: 17.85 sec with arms across chest; 08/20/2021= 17.6 sec , 7/5: 12.85 sec;    Time 4    Period Weeks    Status Achieved;    Target Date 10/01/21              PT Long  Term Goals -       PT LONG TERM GOAL #1   Title Patient will increase six minute walk test distance to >1000 for progression to community ambulator and improve gait ability    Baseline 4/4: 1115 feet , 7/5: 1080 feet;    Time 4    Period Weeks    Status Achieved    Target Date 08/21/21      PT LONG TERM GOAL #2   Title Patient will ascend/descend 8 stairs without rail assist independently without loss of balance to improve ability to get in/out of home.    Baseline 4/4: requires 1 rail assist; 08/20/2021- Patient demonstrated steps without railing using reciprocal steps today. 7/5: ascend/descend 6 steps reciprocally without rail, but required CGA for safety when descending;    Time 4    Period Weeks    Status Partially met   Target Date 08/21/21      PT LONG TERM GOAL #3   Title Patient will increase BLE gross strength to 4+/5 as to improve functional strength for independent gait, increased standing  tolerance and increased ADL ability.    Baseline 4/4: not formally assessed; 08/20/2021= 4+/5 with Right hip flex/knee ext/flex; Right hip abd= 4/5, 7/5: 4+/5 hip    Time 12    Period Weeks    Status Achieved;    Target Date 11/12/21      PT LONG TERM GOAL #4   Title Patient will improve FOTO score to >50% to indicate improved functional mobility with less pain with ADLs.    Baseline 4/4: 35%; 08/20/2021= 42% 7/5: 47%   Time 12    Period Weeks    Status Partially Met   Target Date 11/12/21      PT LONG TERM GOAL #5   Title Patient will report a worst pain of 4/10 in low back over last week to indicate improved tolerance with ADLs.    Baseline 4/4: 7/10; 08/20/2021= 4/10 , 7/5: worst 7/10 in low back   Time 12    Period Weeks    Status Not Met    Target Date 11/12/21      Additional Long Term Goals   Additional Long Term Goals Yes      PT LONG TERM GOAL #6   Title Pt will increase 10MWT by at least 0.13 m/s in order to demonstrate clinically significant improvement in community ambulation    Baseline 08/20/2021=0.94 m/s 7/5: 1.5 m/s   Time 12    Period Weeks    Status Achieved;    Target Date 11/12/21      PT LONG TERM GOAL #7   Title Patient will demonstrate improved static standing balance as seen by ability to single leg stance on right LE > 12 sec consistently for optimal balance on level and unlevel surfaces.    Baseline 08/20/2021= 4 sec SLS on right , 7/5: 4-5 sec inconsistent;    Time 12    Period Weeks    Status Not Met   Target Date 11/12/21              Plan -     Clinical Impression Statement Pt able to progress to more challenging variations of interventions and also performed these interventions with increased weights. This indicates improved BLE strength. Pt does continue to rate exercises medium-hard with noted fatigue. No increased pain with exercises. The pt will benefit from further skilled PT to improve strength and balance, and decrease pain levels.  Personal Factors and Comorbidities Comorbidity 3+;Past/Current Experience;Time since onset of injury/illness/exacerbation    Comorbidities PMHx includes: Bilateral LBP with right sided sciatica, Lumbosacral spondylosis myelopathy, closed compression Fx of L1. Has chronic insomnia. osteoarthritis, Panic anxiety syndrome (taking medication)    Examination-Activity Limitations Bend;Carry;Locomotion Level;Sit;Squat;Stairs;Stand;Transfers    Examination-Participation Restrictions Cleaning;Community Activity;Driving;Laundry;Meal Prep;Occupation;Shop;Volunteer;Yard Work    Stability/Clinical Decision Making Stable/Uncomplicated    Rehab Potential Good    PT Frequency 2x / week    PT Duration 12 weeks    PT Treatment/Interventions Cryotherapy;Electrical Stimulation;Moist Heat;Traction;Gait training;Stair training;Functional mobility training;Therapeutic activities;Therapeutic exercise;Balance training;Neuromuscular re-education;Patient/family education;Manual techniques;Passive range of motion;Dry needling;Energy conservation    PT Next Visit Plan  Progress core stabilization, Manual therapy for low back and LE ROM, continue POC       Consulted and Agree with Plan of Care Patient              Zollie Pee, PT 11/06/2021, [place] [address] [place], [place], [address] Phone: [phone]   Fax:  [phone]

## 2021-11-09 ENCOUNTER — Encounter: Payer: Medicare HMO | Admitting: Speech Pathology

## 2021-11-09 ENCOUNTER — Ambulatory Visit: Payer: Medicare Other

## 2021-11-09 DIAGNOSIS — M6281 Muscle weakness (generalized): Secondary | ICD-10-CM | POA: Diagnosis not present

## 2021-11-09 DIAGNOSIS — Z8673 Personal history of transient ischemic attack (TIA), and cerebral infarction without residual deficits: Secondary | ICD-10-CM

## 2021-11-09 DIAGNOSIS — R278 Other lack of coordination: Secondary | ICD-10-CM

## 2021-11-09 DIAGNOSIS — G8929 Other chronic pain: Secondary | ICD-10-CM

## 2021-11-09 NOTE — Therapy (Signed)
[place] MAIN [facility] SERVICES [address] [place], [place], [address] Phone: [phone]   Fax:  [phone]  Patient Details  Name: David Reeves MRN: [id number] Date of Birth: 11/26/1970 Referring Provider:  [name], PA-C  Encounter Date: 11/09/2021   OUTPATIENT PHYSICAL THERAPY TREATMENT NOTE     Patient Name: David Reeves MRN: [id number] DOB:[birth year], 51 y.o., male Today's Date: 11/09/2021  PCP: David Kirschner, PA-C REFERRING PROVIDER: [name], PA-C   PT End of Session - 11/09/21 1003     Visit Number 34    Number of Visits 41    Date for PT Re-Evaluation 11/12/21    Authorization Type [employer]- authorized 16 visits 4/4-6/1    Authorization - Visit Number 5    Authorization - Number of Visits 16    Progress Note Due on Visit 40    PT Start Time 1000    PT Stop Time 1045    PT Time Calculation (min) 45 min    Activity Tolerance Patient tolerated treatment well;No increased pain    Behavior During Therapy [facility] for tasks assessed/performed                   Past Medical History:  Diagnosis Date   Back pain 04/22/2012   Bone spur    Bulging disc 04/22/2012   Degenerative disc disease    Osteoarthritis    Panic anxiety syndrome    Stroke ([place]) 10/2020   Taking multiple medications for chronic disease    Past Surgical History:  Procedure Laterality Date   CYSTECTOMY     head   HERNIA REPAIR Left 2006,2014   [facility]   TOE SURGERY Right 2007   Patient Active Problem List   Diagnosis Date Noted   Alterations of sensations following cerebrovascular accident 03/08/2021   Aphasia as late effect of cerebrovascular accident (CVA) 03/08/2021   Muscle spasm 02/22/2021   Acute non-recurrent frontal sinusitis 02/22/2021   Chronic pain syndrome 01/23/2021   Primary hypertension 01/23/2021   Nerve pain 01/23/2021   Erectile dysfunction due to diseases classified elsewhere  01/23/2021   Cognitive dysfunction 01/23/2021   Scrotal edema 01/23/2021   Elevated LDL cholesterol level 01/23/2021   History of stroke with residual deficit 01/23/2021   Primary insomnia 01/23/2021   Mouth pain 01/23/2021   Laceration of right hand without foreign body    Closed compression fracture of L1 vertebra ([place]) 05/24/2016   Lumbar stenosis with neurogenic claudication 11/01/2015   Chronic 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 11/08/2014   Hx of hemorrhoids 11/08/2014   AAA (abdominal aortic aneurysm) ([place]) 09/22/2014   Chronic pain associated with significant psychosocial dysfunction 09/22/2014   Panic disorder 09/22/2014   AB (asthmatic bronchitis) 08/17/2014   Anxiety 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 08/17/2014   Backache 08/17/2014   Lumbosacral spondylosis without myelopathy 08/17/2014   Disorder of male genital organs 08/17/2014   Brash 08/17/2014   Low back pain 08/17/2014   Tendon nodule 08/17/2014   Episodic paroxysmal anxiety disorder 08/17/2014   Hernia, inguinal, right 08/17/2014   Fast heart beat 08/17/2014   Illness 08/17/2014   Inguinal hernia 10/14/2012    REFERRING DIAG: History of stroke with residual deficit, chronic 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, Lumbosacral spondylosis without myelopathy THERAPY DIAG:  Chronic bilateral low back pain without sciatica  Muscle weakness (generalized)  Other lack of coordination  H/O ischemic left MCA stroke  Apraxia  Rationale for  Evaluation and Treatment Rehabilitation  PERTINENT HISTORY: Pt. is a 51 y.o. who was diagnosed with a CVA on July 21st, 2022. Pt. completed several weeks of inpatient rehab at [facility]. After returning to home. He was discharged in late August/early September and recieved home health PT. Pt. sustained a fall in december of 2022, and was admitted to the hospital with COVID-19, and Chronic back pain. He reports chronic back pain syndrome since 2012.  Since the most recent discharge, Pt. has been residing with the ex-wife until the Pt. is ready to return to independent living. He did recieve 2 weeks of home health after discharge in December 2022. He is now being referred to outpatient PT to address weakness from stroke and improve fine motor movement. He reports rarely getting numbness/tingling in RLE. PMHx includes: Bilateral LBP with right sided sciatica, Lumbosacral spondylosis myelopathy, closed compression Fx of L1. Has chronic insomnia. osteoarthritis, Panic anxiety syndrome (taking medication) Pt. enjoys cooking, and riding motorcycles. Patient is going to [facility] spine center for Epidural spinal injections on 06/07/21;  PRECAUTIONS: Fall, Spinal Brace wears daily, approximately 25%, when exercising; semi-rigid lumbar brace  SUBJECTIVE: Pt reports no changes/concerns since last visit. Pt reports received order for increased pain patch dosage, has not picked it up yet.   PAIN:  Are you having pain? Yes: NPRS scale: 3/10 Pain location: bilat low back Pain description: dull ache; always constant,  Aggravating factors: worse with sitting,  Relieving factors: heat helps temporarily, pain pills/pain patch   TODAY'S TREATMENT:    11/09/2021   Pt supine on plinth with heat donned to pt's low back while pt performs interventions. No adverse reaction to treatment. Pt reports heat feels good.   LTR: 20x   Single knee to chest: 5 x 10 sec hold each LE  With PPT engaged: - Bridges: 2 x 12 with 2 sec holds, red stability ball, reports no increased pain, rates as medium - Hamstring curls: 15x, red physio ball  Progressed to SPT providing resistance 2 x 15, rates as challenging - SLR: 2 x 12 B @ 5# AW.  Rates medium-hard  - Mini crunches: 10x - Reverse march: 2 x 12, cues for sequencing throughout, rates as difficult  Seated march: 20x, no increase in pain, reports difficult on RLE    Pt reports no increase in lower back pain levels at the  end of today's session.   PATIENT EDUCATION: Education details: exercise technique, body mechanics Person educated: Patient Education method: Explanation, Demonstration, Tactile cues, and Verbal cues Education comprehension: verbalized understanding, returned demonstration, verbal cues required, tactile cues required, and needs further education   HOME EXERCISE PROGRAM: 11/09/21: no updates  Continue as previously given   PT Short Term Goals -       PT SHORT TERM GOAL #1   Title Patient will be adherent to HEP at least 3x a week to improve functional strength and balance for better safety at home.    Baseline 4/4: doing them 1-2x a week; 08/20/2021=Patient reports performing his low back stretching and no questions currently HEP. 10/24/21: doing HEP 4x a week   Time 4    Period Weeks    Status Achieved    Target Date 08/21/21      PT SHORT TERM GOAL #2   Title Patient (< 63 years old) will complete five times sit to stand test in < 15 seconds indicating an increased LE strength and improved balance.    Baseline 4/4: 17.85 sec with arms  across chest; 08/20/2021= 17.6 sec , 7/5: 12.85 sec;    Time 4    Period Weeks    Status Achieved;    Target Date 10/01/21              PT Long Term Goals -       PT LONG TERM GOAL #1   Title Patient will increase six minute walk test distance to >1000 for progression to community ambulator and improve gait ability    Baseline 4/4: 1115 feet , 7/5: 1080 feet;    Time 4    Period Weeks    Status Achieved    Target Date 08/21/21      PT LONG TERM GOAL #2   Title Patient will ascend/descend 8 stairs without rail assist independently without loss of balance to improve ability to get in/out of home.    Baseline 4/4: requires 1 rail assist; 08/20/2021- Patient demonstrated steps without railing using reciprocal steps today. 7/5: ascend/descend 6 steps reciprocally without rail, but required CGA for safety when descending;    Time 4    Period  Weeks    Status Partially met   Target Date 08/21/21      PT LONG TERM GOAL #3   Title Patient will increase BLE gross strength to 4+/5 as to improve functional strength for independent gait, increased standing tolerance and increased ADL ability.    Baseline 4/4: not formally assessed; 08/20/2021= 4+/5 with Right hip flex/knee ext/flex; Right hip abd= 4/5, 7/5: 4+/5 hip    Time 12    Period Weeks    Status Achieved;    Target Date 11/12/21      PT LONG TERM GOAL #4   Title Patient will improve FOTO score to >50% to indicate improved functional mobility with less pain with ADLs.    Baseline 4/4: 35%; 08/20/2021= 42% 7/5: 47%   Time 12    Period Weeks    Status Partially Met   Target Date 11/12/21      PT LONG TERM GOAL #5   Title Patient will report a worst pain of 4/10 in low back over last week to indicate improved tolerance with ADLs.    Baseline 4/4: 7/10; 08/20/2021= 4/10 , 7/5: worst 7/10 in low back   Time 12    Period Weeks    Status Not Met    Target Date 11/12/21      Additional Long Term Goals   Additional Long Term Goals Yes      PT LONG TERM GOAL #6   Title Pt will increase 10MWT by at least 0.13 m/s in order to demonstrate clinically significant improvement in community ambulation    Baseline 08/20/2021=0.94 m/s 7/5: 1.5 m/s   Time 12    Period Weeks    Status Achieved;    Target Date 11/12/21      PT LONG TERM GOAL #7   Title Patient will demonstrate improved static standing balance as seen by ability to single leg stance on right LE > 12 sec consistently for optimal balance on level and unlevel surfaces.    Baseline 08/20/2021= 4 sec SLS on right , 7/5: 4-5 sec inconsistent;    Time 12    Period Weeks    Status Not Met   Target Date 11/12/21              Plan -     Clinical Impression Statement Continued focusing on advancement of interventions today. Pt pleasant and highly motivated  for therapy. Pt tolerated progressions well with no reports of increased  pain and noted increased ability to maintain PPT, indicating improved BLE strength and improved core stabilization. Pt continues to have trouble with sequencing of higher level exercises, requires verbal and tactile cue to correct. The pt will benefit from further skilled PT to improve strength and balance, and decrease pain levels.   Personal Factors and Comorbidities Comorbidity 3+;Past/Current Experience;Time since onset of injury/illness/exacerbation    Comorbidities PMHx includes: Bilateral LBP with right sided sciatica, Lumbosacral spondylosis myelopathy, closed compression Fx of L1. Has chronic insomnia. osteoarthritis, Panic anxiety syndrome (taking medication)    Examination-Activity Limitations Bend;Carry;Locomotion Level;Sit;Squat;Stairs;Stand;Transfers    Examination-Participation Restrictions Cleaning;Community Activity;Driving;Laundry;Meal Prep;Occupation;Shop;Volunteer;Yard Work    Stability/Clinical Decision Making Stable/Uncomplicated    Rehab Potential Good    PT Frequency 2x / week    PT Duration 12 weeks    PT Treatment/Interventions Cryotherapy;Electrical Stimulation;Moist Heat;Traction;Gait training;Stair training;Functional mobility training;Therapeutic activities;Therapeutic exercise;Balance training;Neuromuscular re-education;Patient/family education;Manual techniques;Passive range of motion;Dry needling;Energy conservation    PT Next Visit Plan  Progress core stabilization, Manual therapy for low back and LE ROM, continue POC       Consulted and Agree with Plan of Care Patient             Izola Price, SPT  This entire session was performed under direct supervision and direction of a licensed therapist. I have personally read, edited and approve of the note as written. Ricard Dillon PT, DPT   Izola Price, Student-PT 11/09/2021, 10:49 AM  [place] MAIN [facility] SERVICES [address] [place], [place], [address] Phone:  [phone]   Fax:  [phone]

## 2021-11-11 ENCOUNTER — Encounter: Payer: Self-pay | Admitting: Physician Assistant

## 2021-11-12 ENCOUNTER — Other Ambulatory Visit: Payer: Self-pay

## 2021-11-12 NOTE — Therapy (Signed)
OUTPATIENT OCCUPATIONAL THERAPY TREATMENT NOTE  Patient Name: [facility] MRN: [id number] DOB:11-Nov-1970, 51 y.o., male Today's Date: 11/12/2021  PCP: [name], PA-C REFERRING PROVIDER: [name], PA-C  END OF SESSION:      OT End of Session - 11/12/21 0901     Visit Number 42    Number of Visits 95    Date for OT Re-Evaluation 01/25/22    Authorization Type Progress reporting period starting 11/02/2021    OT Start Time 0915    OT Stop Time 1000    OT Time Calculation (min) 45 min    Activity Tolerance Patient tolerated treatment well    Behavior During Therapy [facility] for tasks assessed/performed              Past Medical History:  Diagnosis Date   Back pain 04/22/2012   Bone spur    Bulging disc 04/22/2012   Degenerative disc disease    Osteoarthritis    Panic anxiety syndrome    Stroke ([facility]) 10/2020   Taking multiple medications for chronic disease    Past Surgical History:  Procedure Laterality Date   CYSTECTOMY     head   HERNIA REPAIR Left 2006,2014   [facility]   TOE SURGERY Right 2007   Patient Active Problem List   Diagnosis Date Noted   Alterations of sensations following cerebrovascular accident 03/08/2021   Aphasia as late effect of cerebrovascular accident (CVA) 03/08/2021   Muscle spasm 02/22/2021   Acute non-recurrent frontal sinusitis 02/22/2021   Chronic pain syndrome 01/23/2021   Primary hypertension 01/23/2021   Nerve pain 01/23/2021   Erectile dysfunction due to diseases classified elsewhere 01/23/2021   Cognitive dysfunction 01/23/2021   Scrotal edema 01/23/2021   Elevated LDL cholesterol level 01/23/2021   History of stroke with residual deficit 01/23/2021   Primary insomnia 01/23/2021   Mouth pain 01/23/2021   Laceration of right hand without foreign body    Closed compression fracture of L1 vertebra ([facility]) 05/24/2016   Lumbar stenosis with neurogenic claudication 11/01/2015   Chronic bilateral low back pain with  right-sided sciatica 11/08/2014   Hx of hemorrhoids 11/08/2014   AAA (abdominal aortic aneurysm) ([place]) 09/22/2014   Chronic pain associated with significant psychosocial dysfunction 09/22/2014   Panic disorder 09/22/2014   AB (asthmatic bronchitis) 08/17/2014   Anxiety 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 08/17/2014   Backache 08/17/2014   Lumbosacral spondylosis without myelopathy 08/17/2014   Disorder of male genital organs 08/17/2014   Brash 08/17/2014   Low back pain 08/17/2014   Tendon nodule 08/17/2014   Episodic paroxysmal anxiety disorder 08/17/2014   Hernia, inguinal, right 08/17/2014   Fast heart beat 08/17/2014   Illness 08/17/2014   Inguinal hernia 10/14/2012    ONSET DATE: 11/09/2020  REFERRING DIAG: CVA  THERAPY DIAG:  Muscle weakness (generalized)  Other lack of coordination  H/O ischemic left MCA stroke  Rationale for Evaluation and Treatment Rehabilitation  PERTINENT HISTORY: Pt. is a 51 y.o. who was diagnosed with a CVA on July 21st, 2022. Pt. completed several weeks of  inpatient rehab at [facility]. After returning to home, Pt. sustained a fall in december of 2022, and was admitted to the hospital with COVI-19, and back pain from the fall. Since the most recent discharge, Pt. has been residing with the ex-wife until the Pt. is ready to return to independent living. Pt. PMHx includes: Bilateral LBP with right sided sciatica, Lumbosacral spondylosis myelopathy, closed compression Fx of L1. Pt. enjoys cooking, and  riding motorcycles.   PRECAUTIONS: Fall  SUBJECTIVE: Pt reports that he continues to see the improvement in his R arm. PAIN:  Are you having pain? Yes: NPRS scale: 4/10 Pain location: low back, L shoulder Pain description: aching Aggravating factors: prolonged sitting or lying down, excessive use of LUE Relieving factors: rest, heat, walking   OBJECTIVE:  TODAY'S TREATMENT:    Neuromuscular Reeducation: Completed R palmar abd and add gliding  thumb over tennis ball.  Pt requires active assist to move thumb into palmar abduction and continued reinforcement to provide the least amount of assist necessary to complete the movement.  Practiced combination movements and sustained grasp of tennis ball during gross motor movement from the shoulder, elbow, forearm, and wrist.  Pt struggles to maintain grip of ball during supination and wrist flexion.  Performed passive supination stretch to maximize ROM during these movement attempts.  Practiced maintaining grasp of ball with active wrist flexion with forearm pronated x2 sets 10 reps with noted increase in wrist flexion just beyond neutral (previous attempts pt unable to move beyond neutral d/t spasticity).  Performed place and hold for R thumb radial abd and resistive adduction with pt practicing pinch into therapist's hand x2 sets 10 reps each.  PATIENT EDUCATION: Education details: HEP progression: tennis ball grasp/release, palmar abd/add glides on tennis ball, and gross motor movements working to maintain grip of tennis ball Person educated: Patient Education method: Explanation Education comprehension: verbalized and demonstrated understanding.      OT Short Term Goals -      OT SHORT TERM GOAL #1   Title Pt. wil demonstrtae independence with HEPs    Baseline Eval: Pt. currently does not have one, 10th visit:  changes in HEP ongoing; 20th: instructed in putty exercises, changes ongoing; 40th: continual updates to HEP as pt progresses   Time 6    Period Weeks    Status On-going    Target Date 11/20/21             OT Long Term Goals -       OT LONG TERM GOAL #1   Title Pt. will improve RUE strength by 2 mm grades to assist with ADLs, and IADLs,    Baseline Eval: Right shoulder flexion, abduction 3/5, elbow flexion, extension wrist extension 3+/5, 10th visit: improving with RUE strength by not yet met goal; 20th visit: R shd flex/abd 4-, elbow flex/ext 4+, wrist flex 4-, wrist ext  4+; 40:  R shd flex/abd 4,  elbow flex/ext 4+, wrist flex 4, wrist ext 4+   Time 12    Period Weeks    Status On-going    Target Date 01/25/22      OT LONG TERM GOAL #2   Title Pt. will improve right grip by 10 lbs to prepare for firmly holding objects for IADLs.    Baseline Eval: R: 38#, L: 124# (In 3rd dynamometer slot); R 35# in 4th slot, 28# in 3rd slot, 40th: 47 in 4th slot   Time 12    Period Weeks    Status On-going    Target Date 01/25/22      OT LONG TERM GOAL #3   Title Pt. will improve right lateral pinch strength by 5# to assist with cutting food    Baseline Eval: R: 17, L: 25; 20th: R: 18; 40th: 15# fingers slipping on meter   Time 12    Period Weeks    Status On-going    Target Date 01/25/22  OT LONG TERM GOAL #4   Title Pt. will improve Right 3pt pinch by 2# to be able to hold/open items for cooking    Baseline Eval: R: Pt. unable to engage thumb L: 29#; 20th: R unable; 40th:  unable, fingers slipping   Time 12    Period Weeks    Status On-going    Target Date 01/25/22      OT LONG TERM GOAL #5   Title Pt. will improve right hand [facility] skills to be able to independently manipulate buttons, and zippers.    Baseline Eval: Pt. has difficulty managing buttons,a nd zippers. 10th visit:  improving; 20th: unable to engage R hand effectively, performs 1 handed with L    Time 12    Period Weeks    Status On-going    Target Date 01/25/22      OT LONG TERM GOAL #6   Title Pt. will independently write his name    Baseline Eval: Pt. is unable to hold a pen; 20th: pt can grip PenAgain with R hand and can make marks on paper with difficulty, but not yet writing name; 40th:  continues to demonstrate difficulty with grasp of pen   Time 12    Period Weeks    Status On-going    Target Date 01/25/22      OT LONG TERM GOAL #7   Title Pt. will improve FOTO score by 2 points to reflect funational improvement    Baseline Eval: FOTO score 46 with TR score 52; 20th: 45; 40th:     Time 12    Period Weeks    Status On-going    Target Date 01/25/22              Plan -    Clinical Impression Statement Pt tolerated all exercises well this date with frequent rest breaks.  Pt continues to work towards isolated movements of the forearm, wrist, and hand as spasticity and motor planning continue to limit efficiency with these movements.  Pt improving with R thumb radial abduction and wrist flexion and supination with ball in hand.  He remains focused and consistent with his home exercises, and continues to progress HEP to further challenge RUE.  Pt continues to benefit from skilled OT services to maximize his safety and independence in necessary daily tasks.  Will continue to work towards neuro reed of right hand with emphasis on thumb movements to integrate into functional grasping and manipulation skills.    OT Occupational Profile and History Detailed Assessment- Review of Records and additional review of physical, cognitive, psychosocial history related to current functional performance    Occupational performance deficits (Please refer to evaluation for details): ADL's;IADL's;Education    Rehab Potential Good    Clinical Decision Making Several treatment options, min-mod task modification necessary    Comorbidities Affecting Occupational Performance: May have comorbidities impacting occupational performance    Modification or Assistance to Complete Evaluation  Min-Moderate modification of tasks or assist with assess necessary to complete eval    OT Frequency 2x / week    OT Duration 12 weeks    OT Treatment/Interventions Self-care/ADL training;DME and/or AE instruction;Therapeutic exercise;Ultrasound;Neuromuscular education;Therapeutic activities;Energy conservation;Moist Heat;Patient/family education;Splinting;Functional Mobility Training;Paraffin    Plan Neuro re-ed and therapeutic exercise    Consulted and Agree with Plan of Care Patient             Leta Speller, MS, OTR/L  Darleene Cleaver, OT 11/12/2021, 9:03 AM

## 2021-11-13 ENCOUNTER — Ambulatory Visit: Payer: Medicare Other

## 2021-11-13 ENCOUNTER — Encounter: Payer: Medicare HMO | Admitting: Speech Pathology

## 2021-11-13 DIAGNOSIS — M6281 Muscle weakness (generalized): Secondary | ICD-10-CM | POA: Diagnosis not present

## 2021-11-13 DIAGNOSIS — G8929 Other chronic pain: Secondary | ICD-10-CM

## 2021-11-13 DIAGNOSIS — R2681 Unsteadiness on feet: Secondary | ICD-10-CM

## 2021-11-13 DIAGNOSIS — Z8673 Personal history of transient ischemic attack (TIA), and cerebral infarction without residual deficits: Secondary | ICD-10-CM

## 2021-11-13 DIAGNOSIS — R278 Other lack of coordination: Secondary | ICD-10-CM

## 2021-11-13 NOTE — Therapy (Signed)
OUTPATIENT OCCUPATIONAL THERAPY TREATMENT NOTE  Patient Name: [facility] MRN: [id number] DOB:1970/10/05, 51 y.o., male Today's Date: 11/13/2021  PCP: [name], PA-C REFERRING PROVIDER: [name], PA-C  END OF SESSION:      OT End of Session - 11/13/21 1019     Visit Number 43    Number of Visits 57    Date for OT Re-Evaluation 01/25/22    Authorization Type Progress reporting period starting 11/02/2021    OT Start Time 0921    OT Stop Time 1004    OT Time Calculation (min) 43 min    Activity Tolerance Patient tolerated treatment well    Behavior During Therapy [facility] for tasks assessed/performed              Past Medical History:  Diagnosis Date   Back pain 04/22/2012   Bone spur    Bulging disc 04/22/2012   Degenerative disc disease    Osteoarthritis    Panic anxiety syndrome    Stroke ([place]) 10/2020   Taking multiple medications for chronic disease    Past Surgical History:  Procedure Laterality Date   CYSTECTOMY     head   HERNIA REPAIR Left 2006,2014   [facility]   TOE SURGERY Right 2007   Patient Active Problem List   Diagnosis Date Noted   Alterations of sensations following cerebrovascular accident 03/08/2021   Aphasia as late effect of cerebrovascular accident (CVA) 03/08/2021   Muscle spasm 02/22/2021   Acute non-recurrent frontal sinusitis 02/22/2021   Chronic pain syndrome 01/23/2021   Primary hypertension 01/23/2021   Nerve pain 01/23/2021   Erectile dysfunction due to diseases classified elsewhere 01/23/2021   Cognitive dysfunction 01/23/2021   Scrotal edema 01/23/2021   Elevated LDL cholesterol level 01/23/2021   History of stroke with residual deficit 01/23/2021   Primary insomnia 01/23/2021   Mouth pain 01/23/2021   Laceration of right hand without foreign body    Closed compression fracture of L1 vertebra ([place]) 05/24/2016   Lumbar stenosis with neurogenic claudication 11/01/2015   Chronic bilateral low back pain with  right-sided sciatica 11/08/2014   Hx of hemorrhoids 11/08/2014   AAA (abdominal aortic aneurysm) ([place]) 09/22/2014   Chronic pain associated with significant psychosocial dysfunction 09/22/2014   Panic disorder 09/22/2014   AB (asthmatic bronchitis) 08/17/2014   Anxiety 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 08/17/2014   Backache 08/17/2014   Lumbosacral spondylosis without myelopathy 08/17/2014   Disorder of male genital organs 08/17/2014   Brash 08/17/2014   Low back pain 08/17/2014   Tendon nodule 08/17/2014   Episodic paroxysmal anxiety disorder 08/17/2014   Hernia, inguinal, right 08/17/2014   Fast heart beat 08/17/2014   Illness 08/17/2014   Inguinal hernia 10/14/2012    ONSET DATE: 11/09/2020  REFERRING DIAG: CVA  THERAPY DIAG:  Muscle weakness (generalized)  Other lack of coordination  H/O ischemic left ACA stroke  Rationale for Evaluation and Treatment Rehabilitation  PERTINENT HISTORY: Pt. is a 51 y.o. who was diagnosed with a CVA on July 21st, 2022. Pt. completed several weeks of  inpatient rehab at [facility]. After returning to home, Pt. sustained a fall in december of 2022, and was admitted to the hospital with COVI-19, and back pain from the fall. Since the most recent discharge, Pt. has been residing with the ex-wife until the Pt. is ready to return to independent living. Pt. PMHx includes: Bilateral LBP with right sided sciatica, Lumbosacral spondylosis myelopathy, closed compression Fx of L1. Pt. enjoys cooking, and  riding motorcycles.   PRECAUTIONS: Fall  SUBJECTIVE: Pt reports feeling exhausted today as he woke up 3 x last night with back pain. PAIN:  Are you having pain? Yes: NPRS scale: 4/10 Pain location: low back Pain description: aching Aggravating factors: prolonged sitting or lying down Relieving factors: rest, heat, walking   OBJECTIVE:  TODAY'S TREATMENT:    Neuromuscular Reeducation: Completed R palmar abd and add gliding thumb over tennis  ball.  Pt requires active assist to move thumb into palmar abduction and was able to complete the movement with less active assist from OT this date.  Practiced combination movements and sustained grasp of tennis ball during gross motor movement from the shoulder, elbow, forearm, and wrist.  Difficulty with maintaining grip of ball during supination and wrist flexion with frequent repositioning of ball in hand and cues for focusing on squeeze before initiating combination movements.  Performed passive supination stretch to maximize ROM during these movement attempts.  Practiced maintaining grasp of ball with active wrist flexion and during forearm pron/sup x3 sets 10 reps each.  Practiced grasp/release of tennis ball with cues for maximizing thumb palmar and radial abd.  Performed 3 sets 10 reps of AROM for 2 point and 3 point pinch with OT stabilizing R thumb into palmar abduction while pt worked to touch IF and LF tips to thumb.  Further facilitated 2 and 3 point pinch with practice removing ball pegs from pegboard with repeated attempts/extra time to prevent thumb from slipping.   PATIENT EDUCATION: Education details: HEP progression: tennis ball grasp/release, palmar abd/add glides on tennis ball, and gross motor movements working to maintain grip of tennis ball Person educated: Patient Education method: Explanation Education comprehension: verbalized and demonstrated understanding.      OT Short Term Goals -      OT SHORT TERM GOAL #1   Title Pt. wil demonstrtae independence with HEPs    Baseline Eval: Pt. currently does not have one, 10th visit:  changes in HEP ongoing; 20th: instructed in putty exercises, changes ongoing; 40th: continual updates to HEP as pt progresses   Time 6    Period Weeks    Status On-going    Target Date 11/20/21             OT Long Term Goals -       OT LONG TERM GOAL #1   Title Pt. will improve RUE strength by 2 mm grades to assist with ADLs, and IADLs,     Baseline Eval: Right shoulder flexion, abduction 3/5, elbow flexion, extension wrist extension 3+/5, 10th visit: improving with RUE strength by not yet met goal; 20th visit: R shd flex/abd 4-, elbow flex/ext 4+, wrist flex 4-, wrist ext 4+; 40:  R shd flex/abd 4,  elbow flex/ext 4+, wrist flex 4, wrist ext 4+   Time 12    Period Weeks    Status On-going    Target Date 01/25/22      OT LONG TERM GOAL #2   Title Pt. will improve right grip by 10 lbs to prepare for firmly holding objects for IADLs.    Baseline Eval: R: 38#, L: 124# (In 3rd dynamometer slot); R 35# in 4th slot, 28# in 3rd slot, 40th: 47 in 4th slot   Time 12    Period Weeks    Status On-going    Target Date 01/25/22      OT LONG TERM GOAL #3   Title Pt. will improve right lateral pinch strength by  5# to assist with cutting food    Baseline Eval: R: 17, L: 25; 20th: R: 18; 40th: 15# fingers slipping on meter   Time 12    Period Weeks    Status On-going    Target Date 01/25/22      OT LONG TERM GOAL #4   Title Pt. will improve Right 3pt pinch by 2# to be able to hold/open items for cooking    Baseline Eval: R: Pt. unable to engage thumb L: 29#; 20th: R unable; 40th:  unable, fingers slipping   Time 12    Period Weeks    Status On-going    Target Date 01/25/22      OT LONG TERM GOAL #5   Title Pt. will improve right hand [facility] skills to be able to independently manipulate buttons, and zippers.    Baseline Eval: Pt. has difficulty managing buttons,a nd zippers. 10th visit:  improving; 20th: unable to engage R hand effectively, performs 1 handed with L    Time 12    Period Weeks    Status On-going    Target Date 01/25/22      OT LONG TERM GOAL #6   Title Pt. will independently write his name    Baseline Eval: Pt. is unable to hold a pen; 20th: pt can grip PenAgain with R hand and can make marks on paper with difficulty, but not yet writing name; 40th:  continues to demonstrate difficulty with grasp of pen   Time 12     Period Weeks    Status On-going    Target Date 01/25/22      OT LONG TERM GOAL #7   Title Pt. will improve FOTO score by 2 points to reflect funational improvement    Baseline Eval: FOTO score 46 with TR score 52; 20th: 45; 40th:    Time 12    Period Weeks    Status On-going    Target Date 01/25/22              Plan -    Clinical Impression Statement Pt required frequent rest this date, reporting more general fatigue today d/t poor sleep last night. Pt continues to work towards isolated movements of the forearm, wrist, and hand as spasticity and motor planning continue to limit efficiency with these movements.  Pt improving with R thumb radial abduction and wrist flexion and supination with ball in hand.  Pt continues to benefit from skilled OT services to maximize his safety and independence in necessary daily tasks.  Will continue to work towards neuro reed of right hand with emphasis on thumb movements to integrate into functional grasping and manipulation skills.    OT Occupational Profile and History Detailed Assessment- Review of Records and additional review of physical, cognitive, psychosocial history related to current functional performance    Occupational performance deficits (Please refer to evaluation for details): ADL's;IADL's;Education    Rehab Potential Good    Clinical Decision Making Several treatment options, min-mod task modification necessary    Comorbidities Affecting Occupational Performance: May have comorbidities impacting occupational performance    Modification or Assistance to Complete Evaluation  Min-Moderate modification of tasks or assist with assess necessary to complete eval    OT Frequency 2x / week    OT Duration 12 weeks    OT Treatment/Interventions Self-care/ADL training;DME and/or AE instruction;Therapeutic exercise;Ultrasound;Neuromuscular education;Therapeutic activities;Energy conservation;Moist Heat;Patient/family education;Splinting;Functional  Mobility Training;Paraffin    Plan Neuro re-ed and therapeutic exercise    Consulted and Agree  with Plan of Care Patient             Leta Speller, MS, OTR/L  Darleene Cleaver, OT 11/13/2021, 10:21 AM

## 2021-11-13 NOTE — Therapy (Signed)
[place] MAIN [facility] SERVICES [address] [place], [place], [address] Phone: [phone]   Fax:  [phone]  Patient Details  Name: David Reeves MRN: [id number] Date of Birth: [birth year] Referring Provider:  [name], PA-C  Encounter Date: 11/13/2021   OUTPATIENT PHYSICAL THERAPY TREATMENT NOTE/RECERT     Patient Name: David Reeves MRN: [id number] DOB:07/16/1970, 51 y.o., male Today's Date: 11/13/2021  PCP: [name], PA-C REFERRING PROVIDER: [name], PA-C   PT End of Session - 11/13/21 1003     Visit Number 35    Number of Visits 23    Date for PT Re-Evaluation 02/05/22    Authorization Type [employer]- authorized 16 visits 4/4-6/1    Authorization - Visit Number 5    Authorization - Number of Visits 16    Progress Note Due on Visit 45    PT Start Time 1016    PT Stop Time 1100    PT Time Calculation (min) 44 min    Equipment Utilized During Treatment Gait belt    Activity Tolerance Patient tolerated treatment well;No increased pain    Behavior During Therapy [facility] for tasks assessed/performed                    Past Medical History:  Diagnosis Date   Back pain 04/22/2012   Bone spur    Bulging disc 04/22/2012   Degenerative disc disease    Osteoarthritis    Panic anxiety syndrome    Stroke ([place]) 10/2020   Taking multiple medications for chronic disease    Past Surgical History:  Procedure Laterality Date   CYSTECTOMY     head   HERNIA REPAIR Left 2006,2014   [facility]   TOE SURGERY Right 2007   Patient Active Problem List   Diagnosis Date Noted   Alterations of sensations following cerebrovascular accident 03/08/2021   Aphasia as late effect of cerebrovascular accident (CVA) 03/08/2021   Muscle spasm 02/22/2021   Acute non-recurrent frontal sinusitis 02/22/2021   Chronic pain syndrome 01/23/2021   Primary hypertension 01/23/2021   Nerve pain 01/23/2021    Erectile dysfunction due to diseases classified elsewhere 01/23/2021   Cognitive dysfunction 01/23/2021   Scrotal edema 01/23/2021   Elevated LDL cholesterol level 01/23/2021   History of stroke with residual deficit 01/23/2021   Primary insomnia 01/23/2021   Mouth pain 01/23/2021   Laceration of right hand without foreign body    Closed compression fracture of L1 vertebra ([place]) 05/24/2016   Lumbar stenosis with neurogenic claudication 11/01/2015   Chronic 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 11/08/2014   Hx of hemorrhoids 11/08/2014   AAA (abdominal aortic aneurysm) ([place]) 09/22/2014   Chronic pain associated with significant psychosocial dysfunction 09/22/2014   Panic disorder 09/22/2014   AB (asthmatic bronchitis) 08/17/2014   Anxiety 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 08/17/2014   Backache 08/17/2014   Lumbosacral spondylosis without myelopathy 08/17/2014   Disorder of male genital organs 08/17/2014   Brash 08/17/2014   Low back pain 08/17/2014   Tendon nodule 08/17/2014   Episodic paroxysmal anxiety disorder 08/17/2014   Hernia, inguinal, right 08/17/2014   Fast heart beat 08/17/2014   Illness 08/17/2014   Inguinal hernia 10/14/2012    REFERRING DIAG: History of stroke with residual deficit, chronic 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, Lumbosacral spondylosis without myelopathy THERAPY DIAG:  Unsteadiness on feet  Chronic bilateral low back pain without sciatica  Muscle weakness (generalized)  Rationale  for Evaluation and Treatment Rehabilitation  PERTINENT HISTORY: Pt. is a 51 y.o. who was diagnosed with a CVA on July 21st, 2022. Pt. completed several weeks of inpatient rehab at [facility]. After returning to home. He was discharged in late August/early September and recieved home health PT. Pt. sustained a fall in december of 2022, and was admitted to the hospital with COVID-19, and Chronic back pain. He reports chronic back pain syndrome since  2012. Since the most recent discharge, Pt. has been residing with the ex-wife until the Pt. is ready to return to independent living. He did recieve 2 weeks of home health after discharge in December 2022. He is now being referred to outpatient PT to address weakness from stroke and improve fine motor movement. He reports rarely getting numbness/tingling in RLE. PMHx includes: Bilateral LBP with right sided sciatica, Lumbosacral spondylosis myelopathy, closed compression Fx of L1. Has chronic insomnia. osteoarthritis, Panic anxiety syndrome (taking medication) Pt. enjoys cooking, and riding motorcycles. Patient is going to [facility] for Epidural spinal injections on 06/07/21;  PRECAUTIONS: Fall, Spinal Brace wears daily, approximately 25%, when exercising; semi-rigid lumbar brace  SUBJECTIVE: Pt has trigger point injection August 3rd. Reports no other changes. Pain level currently a 3/10 in low back. Pt reports no stumbles/falls.   PAIN:  Are you having pain? Yes: NPRS scale: 3/10 Pain location: bilat low back Pain description: dull ache; always constant,  Aggravating factors: worse with sitting,  Relieving factors: heat helps temporarily, pain pills/pain patch   TODAY'S TREATMENT:    11/13/2021   NMR: Goals reassessed for Recert. Refer to goal section for details. PT instructed pt throughout in technique with testing and indications of test/goal performance.    Practice ascending/descending steps 5x, frequent cues to use handrail L side, "up with the good, down with the bad" to promote safety as pt required min assist and cuing to grab rail to prevent LOB with stair testing. CGA provided during intervention.  Practice balancing on 6" step with hind-foot and fore-foot x multiple bouts of 15-30 sec to promote safety with stair negotiation, intermittent UE support. CGA provided  Step-downs and step-ups onto 6" step with UUE support to practice technique and balance for safe stair  negotiation. CGA provided.  SLS practice RLE 2x30 sec. Pt requires intermittent UE support. CGA .  Comments: pt improved postural stability and balance with stair negotiation following cueing and instruction of safe technique.    TherEx: Pt supine on plinth with heat donned to pt's low back while pt performs interventions. No adverse reaction to treatment. Skin WNL/at baseline following removal of heat (heat applied for 5 min)  LTR: 20x   Bridges with TrA activation, PPT and alt LE march 2x20 Rates medium  SLR with 6.5# weights each LE 2x10 each LE. Pt rates medium-hard  Single knee to chest: 30 sec hold each LE   Pt reports no increase in lower back pain levels at the end of today's session.   PATIENT EDUCATION:  Education details: exercise technique, body mechanics, goal assessment, safe stair negotiation technique, importance of using UUE assist on rail for ascending/descending steps.  Person educated: Patient Education method: Explanation, Demonstration, Tactile cues, and Verbal cues Education comprehension: verbalized understanding, returned demonstration, verbal cues required, tactile cues required, and needs further education   HOME EXERCISE PROGRAM: 11/13/21: no updates on this date, pt to continue HEP as previously given    PT Short Term Goals -       PT SHORT  TERM GOAL #1   Title Patient will be adherent to HEP at least 3x a week to improve functional strength and balance for better safety at home.    Baseline 4/4: doing them 1-2x a week; 08/20/2021=Patient reports performing his low back stretching and no questions currently HEP. 10/24/21: doing HEP 4x a week   Time 4    Period Weeks    Status Achieved    Target Date 08/21/21      PT SHORT TERM GOAL #2   Title Patient (< 26 years old) will complete five times sit to stand test in < 15 seconds indicating an increased LE strength and improved balance.    Baseline 4/4: 17.85 sec with arms across chest; 08/20/2021=  17.6 sec , 7/5: 12.85 sec   Time 4    Period Weeks    Status Achieved;    Target Date 10/01/21              PT Long Term Goals -       PT LONG TERM GOAL #1   Title Patient will increase six minute walk test distance to >1000 for progression to community ambulator and improve gait ability    Baseline 4/4: 1115 feet , 7/5: 1080 feet;    Time 4    Period Weeks    Status Achieved    Target Date 08/21/21      PT LONG TERM GOAL #2   Title Patient will ascend/descend 8 stairs without rail assist independently without loss of balance to improve ability to get in/out of home.    Baseline 4/4: requires 1 rail assist; 08/20/2021- Patient demonstrated steps without railing using reciprocal steps today. 7/5: ascend/descend 6 steps reciprocally without rail, but required CGA for safety when descending; 7/25: Able to complete without UE support and good balance going up steps, unsafe descending steps without UE support. Required min assist and UE support to prevent LOB descending.   Time 4    Period Weeks    Status On-going    Target Date 12/11/2021      PT LONG TERM GOAL #3   Title Patient will increase BLE gross strength to 4+/5 as to improve functional strength for independent gait, increased standing tolerance and increased ADL ability.    Baseline 4/4: not formally assessed; 08/20/2021= 4+/5 with Right hip flex/knee ext/flex; Right hip abd= 4/5, 7/5: 4+/5 hip    Time 12    Period Weeks    Status Achieved;    Target Date 11/12/21      PT LONG TERM GOAL #4   Title Patient will improve FOTO score to >50% to indicate improved functional mobility with less pain with ADLs.    Baseline 4/4: 35%; 08/20/2021= 42% 7/5: 47%; 7:25:recently addressed PN   Time 12    Period Weeks    Status Partially Met   Target Date 02/05/2022      PT LONG TERM GOAL #5   Title Patient will report a worst pain of 4/10 in low back over last week to indicate improved tolerance with ADLs.    Baseline 4/4: 7/10;  08/20/2021= 4/10 , 7/5: worst 7/10 in low back; 7/25: 6/10    Time 12    Period Weeks    Status  02/05/2022   Target Date 11/12/21      Additional Long Term Goals   Additional Long Term Goals Yes      PT LONG TERM GOAL #6   Title Pt will increase 10MWT by  at least 0.13 m/s in order to demonstrate clinically significant improvement in community ambulation    Baseline 08/20/2021=0.94 m/s 7/5: 1.5 m/s   Time 12    Period Weeks    Status Achieved;    Target Date 11/12/21      PT LONG TERM GOAL #7   Title Patient will demonstrate improved static standing balance as seen by ability to single leg stance on right LE > 12 sec consistently for optimal balance on level and unlevel surfaces.    Baseline 08/20/2021= 4 sec SLS on right , 7/5: 4-5 sec inconsistent; 7/25 able to achieve 15 sec after multiple attempts of 4-5 sec holds   Time 12    Period Weeks    Status Partially Met   Target Date 02/05/2022              Plan -     Clinical Impression Statement Goals reassessed for recert. Pt shows some progress with overall improvement in worst back pain and improved SLB on RLE (however, inconsistent). Pt had more difficulty with descending stairs compared to last assessment. PT advised pt to continue using handrails when ascending/descending steps. Did address this deficit during session with stair negotiation and balance interventions. The patient's condition has the potential to improve in response to therapy. Maximum improvement is yet to be obtained. The anticipated improvement is attainable and reasonable in a generally predictable time.The pt will benefit from further skilled PT to improve strength and balance, and decrease pain levels.   Personal Factors and Comorbidities Comorbidity 3+;Past/Current Experience;Time since onset of injury/illness/exacerbation    Comorbidities PMHx includes: Bilateral LBP with right sided sciatica, Lumbosacral spondylosis myelopathy, closed compression Fx of L1.  Has chronic insomnia. osteoarthritis, Panic anxiety syndrome (taking medication)    Examination-Activity Limitations Bend;Carry;Locomotion Level;Sit;Squat;Stairs;Stand;Transfers    Examination-Participation Restrictions Cleaning;Community Activity;Driving;Laundry;Meal Prep;Occupation;Shop;Volunteer;Yard Work    Stability/Clinical Decision Making Stable/Uncomplicated    Rehab Potential Good    PT Frequency 2x / week    PT Duration 12 weeks    PT Treatment/Interventions Cryotherapy;Electrical Stimulation;Moist Heat;Traction;Gait training;Stair training;Functional mobility training;Therapeutic activities;Therapeutic exercise;Balance training;Neuromuscular re-education;Patient/family education;Manual techniques;Passive range of motion;Dry needling;Energy conservation    PT Next Visit Plan  Progress core stabilization, Manual therapy for low back and LE ROM, instruct in balance HEP       Consulted and Agree with Plan of Care Patient              Zollie Pee, PT 11/13/2021, 4:34 PM  [place] MAIN [facility] SERVICES [address] [place], [place], [address] Phone: [phone]   Fax:  [phone]

## 2021-11-16 ENCOUNTER — Ambulatory Visit: Payer: Medicare Other

## 2021-11-16 ENCOUNTER — Encounter: Payer: Medicare HMO | Admitting: Speech Pathology

## 2021-11-16 DIAGNOSIS — G8929 Other chronic pain: Secondary | ICD-10-CM

## 2021-11-16 DIAGNOSIS — R2681 Unsteadiness on feet: Secondary | ICD-10-CM

## 2021-11-16 DIAGNOSIS — M6281 Muscle weakness (generalized): Secondary | ICD-10-CM

## 2021-11-16 DIAGNOSIS — R278 Other lack of coordination: Secondary | ICD-10-CM

## 2021-11-16 DIAGNOSIS — M545 Low back pain, unspecified: Secondary | ICD-10-CM

## 2021-11-16 DIAGNOSIS — R482 Apraxia: Secondary | ICD-10-CM

## 2021-11-16 NOTE — Therapy (Signed)
[place] SERVICES [address] [place], [place], [address] Phone: [phone]   Fax:  [phone]  Patient Details  Name: David Reeves MRN: [id number] Date of Birth: Jul 26, 1970 Referring Provider:  [name], PA-C  Encounter Date: 11/16/2021   OUTPATIENT PHYSICAL THERAPY TREATMENT NOTE     Patient Name: David Reeves MRN: [id number] DOB:1970/10/14, 51 y.o., male Today's Date: 11/16/2021  PCP: [name], PA-C REFERRING PROVIDER: [name], PA-C   PT End of Session - 11/16/21 1019     Visit Number 36    Number of Visits 40    Date for PT Re-Evaluation 02/05/22    Authorization Type [employer]- authorized 16 visits 4/4-6/1    Authorization - Visit Number 5    Authorization - Number of Visits 16    Progress Note Due on Visit 40    PT Start Time 1017    PT Stop Time 1058    PT Time Calculation (min) 41 min    Equipment Utilized During Treatment Gait belt    Activity Tolerance Patient tolerated treatment well;No increased pain    Behavior During Therapy [facility] for tasks assessed/performed                     Past Medical History:  Diagnosis Date   Back pain 04/22/2012   Bone spur    Bulging disc 04/22/2012   Degenerative disc disease    Osteoarthritis    Panic anxiety syndrome    Stroke ([place]) 10/2020   Taking multiple medications for chronic disease    Past Surgical History:  Procedure Laterality Date   CYSTECTOMY     head   HERNIA REPAIR Left 2006,2014   [facility]   TOE SURGERY Right 2007   Patient Active Problem List   Diagnosis Date Noted   Alterations of sensations following cerebrovascular accident 03/08/2021   Aphasia as late effect of cerebrovascular accident (CVA) 03/08/2021   Muscle spasm 02/22/2021   Acute non-recurrent frontal sinusitis 02/22/2021   Chronic pain syndrome 01/23/2021   Primary hypertension 01/23/2021   Nerve pain 01/23/2021   Erectile  dysfunction due to diseases classified elsewhere 01/23/2021   Cognitive dysfunction 01/23/2021   Scrotal edema 01/23/2021   Elevated LDL cholesterol level 01/23/2021   History of stroke with residual deficit 01/23/2021   Primary insomnia 01/23/2021   Mouth pain 01/23/2021   Laceration of right hand without foreign body    Closed compression fracture of L1 vertebra ([place]) 05/24/2016   Lumbar stenosis with neurogenic claudication 11/01/2015   Chronic 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 11/08/2014   Hx of hemorrhoids 11/08/2014   AAA (abdominal aortic aneurysm) ([place]) 09/22/2014   Chronic pain associated with significant psychosocial dysfunction 09/22/2014   Panic disorder 09/22/2014   AB (asthmatic bronchitis) 08/17/2014   Anxiety 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 08/17/2014   Backache 08/17/2014   Lumbosacral spondylosis without myelopathy 08/17/2014   Disorder of male genital organs 08/17/2014   Brash 08/17/2014   Low back pain 08/17/2014   Tendon nodule 08/17/2014   Episodic paroxysmal anxiety disorder 08/17/2014   Hernia, inguinal, right 08/17/2014   Fast heart beat 08/17/2014   Illness 08/17/2014   Inguinal hernia 10/14/2012    REFERRING DIAG: History of stroke with residual deficit, chronic 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, Lumbosacral spondylosis without myelopathy THERAPY DIAG:  Chronic bilateral low back pain without sciatica  Muscle weakness (generalized)  Unsteadiness on feet  Rationale for Evaluation and Treatment Rehabilitation  PERTINENT HISTORY: Pt. is a 51 y.o. who was diagnosed with a CVA on July 21st, 2022. Pt. completed several weeks of inpatient rehab at [facility]. After returning to home. He was discharged in late August/early September and recieved home health PT. Pt. sustained a fall in december of 2022, and was admitted to the hospital with COVID-19, and Chronic back pain. He reports chronic back pain syndrome since 2012.  Since the most recent discharge, Pt. has been residing with the ex-wife until the Pt. is ready to return to independent living. He did recieve 2 weeks of home health after discharge in December 2022. He is now being referred to outpatient PT to address weakness from stroke and improve fine motor movement. He reports rarely getting numbness/tingling in RLE. PMHx includes: Bilateral LBP with right sided sciatica, Lumbosacral spondylosis myelopathy, closed compression Fx of L1. Has chronic insomnia. osteoarthritis, Panic anxiety syndrome (taking medication) Pt. enjoys cooking, and riding motorcycles. Patient is going to [facility] spine center for Epidural spinal injections on 06/07/21;  PRECAUTIONS: Fall, Spinal Brace wears daily, approximately 25%, when exercising; semi-rigid lumbar brace  SUBJECTIVE: Pt reports 2-3/10 pain in back currently. Wearing pain patch today on chest. He reports this is helping. Reports no stumbles/falls. He reports he is headed in the right direction.  PAIN:  Are you having pain? Yes: NPRS scale: 2-3/10 Pain location: bilat low back Pain description: dull ache; always constant,  Aggravating factors: worse with sitting,  Relieving factors: heat helps temporarily, pain pills/pain patch   TODAY'S TREATMENT:    11/16/2021   TherEx: Pt supine on plinth with heat donned to pt's low back while pt performs interventions. Skin WNL/at baseline and following removal of heat   LTR: 20x   Bridges with TrA activation, PPT 15x Rates medium  PPT with TrA activation and LE march, 6.5# weights donned 2x20 alt LE . Pt states exercise is "getting much easier."   LTR: 10x between sets   SLR with 6.5# weights each LE 2x15 each LE. Pt rates medium. Pt adds ankle pumps throughout   Single knee to chest: 30 sec hold each LE  Heat removed as pt felt too warm and replaced with elastogel. Reports elastogel feels "cool" and good.   Side-lying hip abduction with 5# weights each LE 2x10 each  LE. Medium. Some difficulty with coordinating R side    Pt reports no increase in lower back pain levels at the end of today's session.   PATIENT EDUCATION:  Education details: exercise technique, body mechanics Person educated: Patient Education method: Explanation, Demonstration, Tactile cues, and Verbal cues Education comprehension: verbalized understanding, returned demonstration, verbal cues required, tactile cues required, and needs further education   HOME EXERCISE PROGRAM:   No updates on this date pt to continue HEP as previously given     PT Short Term Goals -       PT SHORT TERM GOAL #1   Title Patient will be adherent to HEP at least 3x a week to improve functional strength and balance for better safety at home.    Baseline 4/4: doing them 1-2x a week; 08/20/2021=Patient reports performing his low back stretching and no questions currently HEP. 10/24/21: doing HEP 4x a week   Time 4    Period Weeks    Status Achieved    Target Date 08/21/21      PT SHORT TERM GOAL #2   Title Patient (< 77 years old) will complete five  times sit to stand test in < 15 seconds indicating an increased LE strength and improved balance.    Baseline 4/4: 17.85 sec with arms across chest; 08/20/2021= 17.6 sec , 7/5: 12.85 sec   Time 4    Period Weeks    Status Achieved;    Target Date 10/01/21              PT Long Term Goals -       PT LONG TERM GOAL #1   Title Patient will increase six minute walk test distance to >1000 for progression to community ambulator and improve gait ability    Baseline 4/4: 1115 feet , 7/5: 1080 feet;    Time 4    Period Weeks    Status Achieved    Target Date 08/21/21      PT LONG TERM GOAL #2   Title Patient will ascend/descend 8 stairs without rail assist independently without loss of balance to improve ability to get in/out of home.    Baseline 4/4: requires 1 rail assist; 08/20/2021- Patient demonstrated steps without railing using reciprocal steps  today. 7/5: ascend/descend 6 steps reciprocally without rail, but required CGA for safety when descending; 7/25: Able to complete without UE support and good balance going up steps, unsafe descending steps without UE support. Required min assist and UE support to prevent LOB descending.   Time 4    Period Weeks    Status On-going    Target Date 12/11/2021      PT LONG TERM GOAL #3   Title Patient will increase BLE gross strength to 4+/5 as to improve functional strength for independent gait, increased standing tolerance and increased ADL ability.    Baseline 4/4: not formally assessed; 08/20/2021= 4+/5 with Right hip flex/knee ext/flex; Right hip abd= 4/5, 7/5: 4+/5 hip    Time 12    Period Weeks    Status Achieved;    Target Date 11/12/21      PT LONG TERM GOAL #4   Title Patient will improve FOTO score to >50% to indicate improved functional mobility with less pain with ADLs.    Baseline 4/4: 35%; 08/20/2021= 42% 7/5: 47%; 7:25:recently addressed PN   Time 12    Period Weeks    Status Partially Met   Target Date 02/05/2022      PT LONG TERM GOAL #5   Title Patient will report a worst pain of 4/10 in low back over last week to indicate improved tolerance with ADLs.    Baseline 4/4: 7/10; 08/20/2021= 4/10 , 7/5: worst 7/10 in low back; 7/25: 6/10    Time 12    Period Weeks    Status  02/05/2022   Target Date 11/12/21      Additional Long Term Goals   Additional Long Term Goals Yes      PT LONG TERM GOAL #6   Title Pt will increase 10MWT by at least 0.13 m/s in order to demonstrate clinically significant improvement in community ambulation    Baseline 08/20/2021=0.94 m/s 7/5: 1.5 m/s   Time 12    Period Weeks    Status Achieved;    Target Date 11/12/21      PT LONG TERM GOAL #7   Title Patient will demonstrate improved static standing balance as seen by ability to single leg stance on right LE > 12 sec consistently for optimal balance on level and unlevel surfaces.    Baseline  08/20/2021= 4 sec SLS on right ,  7/5: 4-5 sec inconsistent; 7/25 able to achieve 15 sec after multiple attempts of 4-5 sec holds   Time 12    Period Weeks    Status Partially Met   Target Date 02/05/2022              Plan -     Clinical Impression Statement Pt continues to tolerate use of increased weights well without pain or significant fatigue. Rates majority of interventions as medium difficulty. Most challenged with coordination for correct technique with RLE during sidelying hip abduction. Will likely reduce weight next session for improved technique. The pt will benefit from further skilled PT to improve strength and balance, and decrease pain levels.   Personal Factors and Comorbidities Comorbidity 3+;Past/Current Experience;Time since onset of injury/illness/exacerbation    Comorbidities PMHx includes: Bilateral LBP with right sided sciatica, Lumbosacral spondylosis myelopathy, closed compression Fx of L1. Has chronic insomnia. osteoarthritis, Panic anxiety syndrome (taking medication)    Examination-Activity Limitations Bend;Carry;Locomotion Level;Sit;Squat;Stairs;Stand;Transfers    Examination-Participation Restrictions Cleaning;Community Activity;Driving;Laundry;Meal Prep;Occupation;Shop;Volunteer;Yard Work    Stability/Clinical Decision Making Stable/Uncomplicated    Rehab Potential Good    PT Frequency 2x / week    PT Duration 12 weeks    PT Treatment/Interventions Cryotherapy;Electrical Stimulation;Moist Heat;Traction;Gait training;Stair training;Functional mobility training;Therapeutic activities;Therapeutic exercise;Balance training;Neuromuscular re-education;Patient/family education;Manual techniques;Passive range of motion;Dry needling;Energy conservation    PT Next Visit Plan  Progress core stabilization, Manual therapy for low back and LE ROM, instruct in balance HEP       Consulted and Agree with Plan of Care Patient              Zollie Pee, PT 11/16/2021,  11:43 AM  [place] [address] [place], [place], [address] Phone: [phone]   Fax:  [phone]

## 2021-11-16 NOTE — Therapy (Signed)
OUTPATIENT OCCUPATIONAL THERAPY TREATMENT NOTE  Patient Name: David Reeves MRN: [id number] DOB:07-14-1970, 51 y.o., male Today's Date: 11/16/2021  PCP: [name], PA-C REFERRING PROVIDER: [name], PA-C  END OF SESSION:      OT End of Session - 11/16/21 2029     Visit Number 44    Number of Visits 67    Date for OT Re-Evaluation 01/25/22    Authorization Type Progress reporting period starting 11/02/2021    OT Start Time 0918    OT Stop Time 1000    OT Time Calculation (min) 42 min    Activity Tolerance Patient tolerated treatment well    Behavior During Therapy [facility] for tasks assessed/performed              Past Medical History:  Diagnosis Date   Back pain 04/22/2012   Bone spur    Bulging disc 04/22/2012   Degenerative disc disease    Osteoarthritis    Panic anxiety syndrome    Stroke ([place]) 10/2020   Taking multiple medications for chronic disease    Past Surgical History:  Procedure Laterality Date   CYSTECTOMY     head   HERNIA REPAIR Left 2006,2014   [facility]   TOE SURGERY Right 2007   Patient Active Problem List   Diagnosis Date Noted   Alterations of sensations following cerebrovascular accident 03/08/2021   Aphasia as late effect of cerebrovascular accident (CVA) 03/08/2021   Muscle spasm 02/22/2021   Acute non-recurrent frontal sinusitis 02/22/2021   Chronic pain syndrome 01/23/2021   Primary hypertension 01/23/2021   Nerve pain 01/23/2021   Erectile dysfunction due to diseases classified elsewhere 01/23/2021   Cognitive dysfunction 01/23/2021   Scrotal edema 01/23/2021   Elevated LDL cholesterol level 01/23/2021   History of stroke with residual deficit 01/23/2021   Primary insomnia 01/23/2021   Mouth pain 01/23/2021   Laceration of right hand without foreign body    Closed compression fracture of L1 vertebra ([place]) 05/24/2016   Lumbar stenosis with neurogenic claudication 11/01/2015   Chronic bilateral low back pain with  right-sided sciatica 11/08/2014   Hx of hemorrhoids 11/08/2014   AAA (abdominal aortic aneurysm) ([place]) 09/22/2014   Chronic pain associated with significant psychosocial dysfunction 09/22/2014   Panic disorder 09/22/2014   AB (asthmatic bronchitis) 08/17/2014   Anxiety 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 08/17/2014   Backache 08/17/2014   Lumbosacral spondylosis without myelopathy 08/17/2014   Disorder of male genital organs 08/17/2014   Brash 08/17/2014   Low back pain 08/17/2014   Tendon nodule 08/17/2014   Episodic paroxysmal anxiety disorder 08/17/2014   Hernia, inguinal, right 08/17/2014   Fast heart beat 08/17/2014   Illness 08/17/2014   Inguinal hernia 10/14/2012    ONSET DATE: 11/09/2020  REFERRING DIAG: CVA  THERAPY DIAG:  Muscle weakness (generalized)  Other lack of coordination  Apraxia  Rationale for Evaluation and Treatment Rehabilitation  PERTINENT HISTORY: Pt. is a 51 y.o. who was diagnosed with a CVA on July 21st, 2022. Pt. completed several weeks of  inpatient rehab at [facility]. After returning to home, Pt. sustained a fall in december of 2022, and was admitted to the hospital with COVI-19, and back pain from the fall. Since the most recent discharge, Pt. has been residing with the ex-wife until the Pt. is ready to return to independent living. Pt. PMHx includes: Bilateral LBP with right sided sciatica, Lumbosacral spondylosis myelopathy, closed compression Fx of L1. Pt. enjoys cooking, and riding motorcycles.  PRECAUTIONS: Fall  SUBJECTIVE: Pt reports feeling better than usual this morning and that his stronger pain patches are really helping.  PAIN:  Are you having pain? Yes: NPRS scale: 3/10 Pain location: low back Pain description: aching Aggravating factors: prolonged sitting or lying down Relieving factors: rest, heat, walking   OBJECTIVE:  TODAY'S TREATMENT:    Neuromuscular Reeducation: Completed wrist maze x3 in R hand, rest breaks needed  between each trial.  Completed active assisted and active R palmar abd, add, then 2 and 3 point pinch in prep for grasp/release activities.  Pt used R hand to remove ball pegs from pegboard to target 2 and 3 point pinch in the R hand, requiring cues for technique.  Repeated attempts/extra time to prevent thumb from slipping.   PATIENT EDUCATION: Education details: HEP progression, grasp/release activities  Person educated: Patient Education method: Explanation Education comprehension: verbalized and demonstrated understanding.      OT Short Term Goals -      OT SHORT TERM GOAL #1   Title Pt. wil demonstrtae independence with HEPs    Baseline Eval: Pt. currently does not have one, 10th visit:  changes in HEP ongoing; 20th: instructed in putty exercises, changes ongoing; 40th: continual updates to HEP as pt progresses   Time 6    Period Weeks    Status On-going    Target Date 11/20/21             OT Long Term Goals -       OT LONG TERM GOAL #1   Title Pt. will improve RUE strength by 2 mm grades to assist with ADLs, and IADLs,    Baseline Eval: Right shoulder flexion, abduction 3/5, elbow flexion, extension wrist extension 3+/5, 10th visit: improving with RUE strength by not yet met goal; 20th visit: R shd flex/abd 4-, elbow flex/ext 4+, wrist flex 4-, wrist ext 4+; 40:  R shd flex/abd 4,  elbow flex/ext 4+, wrist flex 4, wrist ext 4+   Time 12    Period Weeks    Status On-going    Target Date 01/25/22      OT LONG TERM GOAL #2   Title Pt. will improve right grip by 10 lbs to prepare for firmly holding objects for IADLs.    Baseline Eval: R: 38#, L: 124# (In 3rd dynamometer slot); R 35# in 4th slot, 28# in 3rd slot, 40th: 47 in 4th slot   Time 12    Period Weeks    Status On-going    Target Date 01/25/22      OT LONG TERM GOAL #3   Title Pt. will improve right lateral pinch strength by 5# to assist with cutting food    Baseline Eval: R: 17, L: 25; 20th: R: 18; 40th: 15#  fingers slipping on meter   Time 12    Period Weeks    Status On-going    Target Date 01/25/22      OT LONG TERM GOAL #4   Title Pt. will improve Right 3pt pinch by 2# to be able to hold/open items for cooking    Baseline Eval: R: Pt. unable to engage thumb L: 29#; 20th: R unable; 40th:  unable, fingers slipping   Time 12    Period Weeks    Status On-going    Target Date 01/25/22      OT LONG TERM GOAL #5   Title Pt. will improve right hand [facility] skills to be able to independently manipulate buttons,  and zippers.    Baseline Eval: Pt. has difficulty managing buttons,a nd zippers. 10th visit:  improving; 20th: unable to engage R hand effectively, performs 1 handed with L    Time 12    Period Weeks    Status On-going    Target Date 01/25/22      OT LONG TERM GOAL #6   Title Pt. will independently write his name    Baseline Eval: Pt. is unable to hold a pen; 20th: pt can grip PenAgain with R hand and can make marks on paper with difficulty, but not yet writing name; 40th:  continues to demonstrate difficulty with grasp of pen   Time 12    Period Weeks    Status On-going    Target Date 01/25/22      OT LONG TERM GOAL #7   Title Pt. will improve FOTO score by 2 points to reflect funational improvement    Baseline Eval: FOTO score 46 with TR score 52; 20th: 45; 40th:    Time 12    Period Weeks    Status On-going    Target Date 01/25/22              Plan -    Clinical Impression Statement Pt reported feel better than normal this morning as increased strength of pain patches has been effective.  Pt continues to develop combination movements that target coordination of forearm, wrist, and hand.  2 point and 3 point pinch were inconsistent today with a lot of thumb slipping when working to remove ball pegs from pegboard, but pt did have several successful trials after repeated attempts and extra time to achieve grasp patterns.  Pt continues to benefit from skilled OT services to  maximize his safety and independence in necessary daily tasks.  Will continue to work towards neuro reed of right hand with emphasis on thumb movements to integrate into functional grasping and manipulation skills.    OT Occupational Profile and History Detailed Assessment- Review of Records and additional review of physical, cognitive, psychosocial history related to current functional performance    Occupational performance deficits (Please refer to evaluation for details): ADL's;IADL's;Education    Rehab Potential Good    Clinical Decision Making Several treatment options, min-mod task modification necessary    Comorbidities Affecting Occupational Performance: May have comorbidities impacting occupational performance    Modification or Assistance to Complete Evaluation  Min-Moderate modification of tasks or assist with assess necessary to complete eval    OT Frequency 2x / week    OT Duration 12 weeks    OT Treatment/Interventions Self-care/ADL training;DME and/or AE instruction;Therapeutic exercise;Ultrasound;Neuromuscular education;Therapeutic activities;Energy conservation;Moist Heat;Patient/family education;Splinting;Functional Mobility Training;Paraffin    Plan Neuro re-ed and therapeutic exercise    Consulted and Agree with Plan of Care Patient             Leta Speller, MS, OTR/L  Darleene Cleaver, OT 11/16/2021, 8:31 PM

## 2021-11-20 ENCOUNTER — Ambulatory Visit: Payer: Medicare Other

## 2021-11-20 ENCOUNTER — Ambulatory Visit: Payer: Medicare Other | Attending: Physician Assistant

## 2021-11-20 ENCOUNTER — Encounter: Payer: Medicare HMO | Admitting: Speech Pathology

## 2021-11-20 DIAGNOSIS — R262 Difficulty in walking, not elsewhere classified: Secondary | ICD-10-CM | POA: Insufficient documentation

## 2021-11-20 DIAGNOSIS — M545 Low back pain, unspecified: Secondary | ICD-10-CM | POA: Diagnosis present

## 2021-11-20 DIAGNOSIS — R482 Apraxia: Secondary | ICD-10-CM | POA: Insufficient documentation

## 2021-11-20 DIAGNOSIS — Z8673 Personal history of transient ischemic attack (TIA), and cerebral infarction without residual deficits: Secondary | ICD-10-CM | POA: Diagnosis present

## 2021-11-20 DIAGNOSIS — R269 Unspecified abnormalities of gait and mobility: Secondary | ICD-10-CM | POA: Insufficient documentation

## 2021-11-20 DIAGNOSIS — M6281 Muscle weakness (generalized): Secondary | ICD-10-CM | POA: Diagnosis present

## 2021-11-20 DIAGNOSIS — R2681 Unsteadiness on feet: Secondary | ICD-10-CM

## 2021-11-20 DIAGNOSIS — R278 Other lack of coordination: Secondary | ICD-10-CM | POA: Diagnosis present

## 2021-11-20 DIAGNOSIS — G8929 Other chronic pain: Secondary | ICD-10-CM | POA: Diagnosis present

## 2021-11-20 DIAGNOSIS — F09 Unspecified mental disorder due to known physiological condition: Secondary | ICD-10-CM | POA: Diagnosis present

## 2021-11-20 NOTE — Therapy (Signed)
[place] [facility] SERVICES [address] [place], [place], [address] Phone: [phone]   Fax:  [phone]  Patient Details  Name: David Reeves MRN: [id number] Date of Birth: 1970/11/10 Referring Provider:  [name], PA-C  Encounter Date: 11/20/2021   OUTPATIENT PHYSICAL THERAPY TREATMENT NOTE     Patient Name: David Reeves MRN: [id number] DOB:01/22/1971, 51 y.o., male Today's Date: 11/20/2021  PCP: [name], PA-C REFERRING PROVIDER: [name], PA-C   PT End of Session - 11/20/21 1007     Visit Number 37    Number of Visits 39    Date for PT Re-Evaluation 02/05/22    Authorization Type [employer]- authorized 16 visits 4/4-6/1    Authorization - Visit Number 5    Authorization - Number of Visits 16    Progress Note Due on Visit 67    PT Start Time 1016    PT Stop Time 1057    PT Time Calculation (min) 41 min    Equipment Utilized During Treatment Gait belt    Activity Tolerance Patient tolerated treatment well;No increased pain    Behavior During Therapy [facility] for tasks assessed/performed                      Past Medical History:  Diagnosis Date   Back pain 04/22/2012   Bone spur    Bulging disc 04/22/2012   Degenerative disc disease    Osteoarthritis    Panic anxiety syndrome    Stroke ([place]) 10/2020   Taking multiple medications for chronic disease    Past Surgical History:  Procedure Laterality Date   CYSTECTOMY     head   HERNIA REPAIR Left 2006,2014   [facility]   TOE SURGERY Right 2007   Patient Active Problem List   Diagnosis Date Noted   Alterations of sensations following cerebrovascular accident 03/08/2021   Aphasia as late effect of cerebrovascular accident (CVA) 03/08/2021   Muscle spasm 02/22/2021   Acute non-recurrent frontal sinusitis 02/22/2021   Chronic pain syndrome 01/23/2021   Primary hypertension 01/23/2021   Nerve pain 01/23/2021   Erectile  dysfunction due to diseases classified elsewhere 01/23/2021   Cognitive dysfunction 01/23/2021   Scrotal edema 01/23/2021   Elevated LDL cholesterol level 01/23/2021   History of stroke with residual deficit 01/23/2021   Primary insomnia 01/23/2021   Mouth pain 01/23/2021   Laceration of right hand without foreign body    Closed compression fracture of L1 vertebra ([place]) 05/24/2016   Lumbar stenosis with neurogenic claudication 11/01/2015   Chronic 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 11/08/2014   Hx of hemorrhoids 11/08/2014   AAA (abdominal aortic aneurysm) ([place]) 09/22/2014   Chronic pain associated with significant psychosocial dysfunction 09/22/2014   Panic disorder 09/22/2014   AB (asthmatic bronchitis) 08/17/2014   Anxiety 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 08/17/2014   Backache 08/17/2014   Lumbosacral spondylosis without myelopathy 08/17/2014   Disorder of male genital organs 08/17/2014   Brash 08/17/2014   Low back pain 08/17/2014   Tendon nodule 08/17/2014   Episodic paroxysmal anxiety disorder 08/17/2014   Hernia, inguinal, right 08/17/2014   Fast heart beat 08/17/2014   Illness 08/17/2014   Inguinal hernia 10/14/2012    REFERRING DIAG: History of stroke with residual deficit, chronic 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, Lumbosacral spondylosis without myelopathy THERAPY DIAG:  Chronic bilateral low back pain without sciatica  Unsteadiness on feet  Muscle weakness (generalized)  Rationale for Evaluation and Treatment Rehabilitation  PERTINENT HISTORY: Pt. is a 51 y.o. who was diagnosed with a CVA on July 21st, 2022. Pt. completed several weeks of inpatient rehab at [facility]. After returning to home. He was discharged in late August/early September and recieved home health PT. Pt. sustained a fall in december of 2022, and was admitted to the hospital with COVID-19, and Chronic back pain. He reports chronic back pain syndrome since 2012.  Since the most recent discharge, Pt. has been residing with the ex-wife until the Pt. is ready to return to independent living. He did recieve 2 weeks of home health after discharge in December 2022. He is now being referred to outpatient PT to address weakness from stroke and improve fine motor movement. He reports rarely getting numbness/tingling in RLE. PMHx includes: Bilateral LBP with right sided sciatica, Lumbosacral spondylosis myelopathy, closed compression Fx of L1. Has chronic insomnia. osteoarthritis, Panic anxiety syndrome (taking medication) Pt. enjoys cooking, and riding motorcycles. Patient is going to [facility] spine center for Epidural spinal injections on 06/07/21;  PRECAUTIONS: Fall, Spinal Brace wears daily, approximately 25%, when exercising; semi-rigid lumbar brace  SUBJECTIVE: Pt reports pain better than usual today, rates it a 2/10. Reports this has been over the last week. Reports no stumbles/falls. Pt reports no other concerns. Has upcoming trigger point injections Aug 3rd.  PAIN:  Are you having pain? Yes: NPRS scale: 2/10 Pain location: bilat low back Pain description: dull ache; always constant,  Aggravating factors: worse with sitting,  Relieving factors: heat helps temporarily, pain pills/pain patch   TODAY'S TREATMENT:    11/20/2021   NMR: gait belt donned, cga provided unless otherwise specified  Forefoot balance on step 2x30 sec B Hindfoot balance on step 2x30 sec B  Balance sustaining heel raise 2x30 sec B. Rates fatiguing for RLE   Step-ups/downs onto and off of 6" step 10x     TherEx: Pt supine on plinth with heat donned to pt's low back while pt performs interventions. Skin WNL/at baseline and following removal of heat   LTR: 20x   Bridges 20x Rates medium-hard  PPT with TrA activation and LE march, 6.5# weights donned 2x20 alt LE . Pt rates exercise as "somewhat easy"   LTR: 10x between sets   SLR with 6.5# weights each LE 20x each LE with addison  of ankle pumps. Pt rates easy-medium.   Side-lying hip abduction with 1.5# weights each LE 12x each LE. Rates easy   Pt reports no increase in lower back pain levels at the end of today's session, remains 2/10   PATIENT EDUCATION:  Education details: exercise technique, body mechanics Person educated: Patient Education method: Explanation, Demonstration, Tactile cues, and Verbal cues Education comprehension: verbalized understanding, returned demonstration, verbal cues required, tactile cues required, and needs further education   HOME EXERCISE PROGRAM:   No updates on this date pt to continue HEP as previously given     PT Short Term Goals -       PT SHORT TERM GOAL #1   Title Patient will be adherent to HEP at least 3x a week to improve functional strength and balance for better safety at home.    Baseline 4/4: doing them 1-2x a week; 08/20/2021=Patient reports performing his low back stretching and no questions currently HEP. 10/24/21: doing HEP 4x a week   Time 4    Period Weeks    Status Achieved    Target Date 08/21/21  PT SHORT TERM GOAL #2   Title Patient (< 59 years old) will complete five times sit to stand test in < 15 seconds indicating an increased LE strength and improved balance.    Baseline 4/4: 17.85 sec with arms across chest; 08/20/2021= 17.6 sec , 7/5: 12.85 sec   Time 4    Period Weeks    Status Achieved;    Target Date 10/01/21              PT Long Term Goals -       PT LONG TERM GOAL #1   Title Patient will increase six minute walk test distance to >1000 for progression to community ambulator and improve gait ability    Baseline 4/4: 1115 feet , 7/5: 1080 feet;    Time 4    Period Weeks    Status Achieved    Target Date 08/21/21      PT LONG TERM GOAL #2   Title Patient will ascend/descend 8 stairs without rail assist independently without loss of balance to improve ability to get in/out of home.    Baseline 4/4: requires 1 rail assist;  08/20/2021- Patient demonstrated steps without railing using reciprocal steps today. 7/5: ascend/descend 6 steps reciprocally without rail, but required CGA for safety when descending; 7/25: Able to complete without UE support and good balance going up steps, unsafe descending steps without UE support. Required min assist and UE support to prevent LOB descending.   Time 4    Period Weeks    Status On-going    Target Date 12/11/2021      PT LONG TERM GOAL #3   Title Patient will increase BLE gross strength to 4+/5 as to improve functional strength for independent gait, increased standing tolerance and increased ADL ability.    Baseline 4/4: not formally assessed; 08/20/2021= 4+/5 with Right hip flex/knee ext/flex; Right hip abd= 4/5, 7/5: 4+/5 hip    Time 12    Period Weeks    Status Achieved;    Target Date 11/12/21      PT LONG TERM GOAL #4   Title Patient will improve FOTO score to >50% to indicate improved functional mobility with less pain with ADLs.    Baseline 4/4: 35%; 08/20/2021= 42% 7/5: 47%; 7:25:recently addressed PN   Time 12    Period Weeks    Status Partially Met   Target Date 02/05/2022      PT LONG TERM GOAL #5   Title Patient will report a worst pain of 4/10 in low back over last week to indicate improved tolerance with ADLs.    Baseline 4/4: 7/10; 08/20/2021= 4/10 , 7/5: worst 7/10 in low back; 7/25: 6/10    Time 12    Period Weeks    Status  02/05/2022   Target Date 11/12/21      Additional Long Term Goals   Additional Long Term Goals Yes      PT LONG TERM GOAL #6   Title Pt will increase 10MWT by at least 0.13 m/s in order to demonstrate clinically significant improvement in community ambulation    Baseline 08/20/2021=0.94 m/s 7/5: 1.5 m/s   Time 12    Period Weeks    Status Achieved;    Target Date 11/12/21      PT LONG TERM GOAL #7   Title Patient will demonstrate improved static standing balance as seen by ability to single leg stance on right LE > 12 sec  consistently for optimal balance  on level and unlevel surfaces.    Baseline 08/20/2021= 4 sec SLS on right , 7/5: 4-5 sec inconsistent; 7/25 able to achieve 15 sec after multiple attempts of 4-5 sec holds   Time 12    Period Weeks    Status Partially Met   Target Date 02/05/2022              Plan -     Clinical Impression Statement Pt able to increase number of reps with strengthening therex, indicating improved LE endurance. Pt also able to sustain postural stability with majority of balance interventions with minimal use of his L UE for support. The pt will benefit from further skilled PT to improve strength and balance, and decrease pain levels.   Personal Factors and Comorbidities Comorbidity 3+;Past/Current Experience;Time since onset of injury/illness/exacerbation    Comorbidities PMHx includes: Bilateral LBP with right sided sciatica, Lumbosacral spondylosis myelopathy, closed compression Fx of L1. Has chronic insomnia. osteoarthritis, Panic anxiety syndrome (taking medication)    Examination-Activity Limitations Bend;Carry;Locomotion Level;Sit;Squat;Stairs;Stand;Transfers    Examination-Participation Restrictions Cleaning;Community Activity;Driving;Laundry;Meal Prep;Occupation;Shop;Volunteer;Yard Work    Stability/Clinical Decision Making Stable/Uncomplicated    Rehab Potential Good    PT Frequency 2x / week    PT Duration 12 weeks    PT Treatment/Interventions Cryotherapy;Electrical Stimulation;Moist Heat;Traction;Gait training;Stair training;Functional mobility training;Therapeutic activities;Therapeutic exercise;Balance training;Neuromuscular re-education;Patient/family education;Manual techniques;Passive range of motion;Dry needling;Energy conservation    PT Next Visit Plan  Progress core stabilization, Manual therapy for low back and LE ROM, instruct in balance HEP       Consulted and Agree with Plan of Care Patient              Zollie Pee, PT 11/20/2021, 11:01  AM  [place] [address] [place], [place], [address] Phone: [phone]   Fax:  [phone]

## 2021-11-20 NOTE — Therapy (Signed)
OUTPATIENT OCCUPATIONAL THERAPY TREATMENT NOTE  Patient Name: David Reeves MRN: [id number] DOB:03-Aug-1970, 51 y.o., male Today's Date: 11/20/2021  PCP: [name], PA-C REFERRING PROVIDER: [name], PA-C  END OF SESSION:      OT End of Session - 11/20/21 1535     Visit Number 45    Number of Visits 81    Date for OT Re-Evaluation 01/25/22    Authorization Type Progress reporting period starting 11/02/2021    OT Start Time 0930    OT Stop Time 1015    OT Time Calculation (min) 45 min    Activity Tolerance Patient tolerated treatment well    Behavior During Therapy [facility] for tasks assessed/performed              Past Medical History:  Diagnosis Date   Back pain 04/22/2012   Bone spur    Bulging disc 04/22/2012   Degenerative disc disease    Osteoarthritis    Panic anxiety syndrome    Stroke ([place]) 10/2020   Taking multiple medications for chronic disease    Past Surgical History:  Procedure Laterality Date   CYSTECTOMY     head   HERNIA REPAIR Left 2006,2014   [facility]   TOE SURGERY Right 2007   Patient Active Problem List   Diagnosis Date Noted   Alterations of sensations following cerebrovascular accident 03/08/2021   Aphasia as late effect of cerebrovascular accident (CVA) 03/08/2021   Muscle spasm 02/22/2021   Acute non-recurrent frontal sinusitis 02/22/2021   Chronic pain syndrome 01/23/2021   Primary hypertension 01/23/2021   Nerve pain 01/23/2021   Erectile dysfunction due to diseases classified elsewhere 01/23/2021   Cognitive dysfunction 01/23/2021   Scrotal edema 01/23/2021   Elevated LDL cholesterol level 01/23/2021   History of stroke with residual deficit 01/23/2021   Primary insomnia 01/23/2021   Mouth pain 01/23/2021   Laceration of right hand without foreign body    Closed compression fracture of L1 vertebra ([place]) 05/24/2016   Lumbar stenosis with neurogenic claudication 11/01/2015   Chronic bilateral low back pain with  right-sided sciatica 11/08/2014   Hx of hemorrhoids 11/08/2014   AAA (abdominal aortic aneurysm) ([address]) 09/22/2014   Chronic pain associated with significant psychosocial dysfunction 09/22/2014   Panic disorder 09/22/2014   AB (asthmatic bronchitis) 08/17/2014   Anxiety 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 08/17/2014   Backache 08/17/2014   Lumbosacral spondylosis without myelopathy 08/17/2014   Disorder of male genital organs 08/17/2014   Brash 08/17/2014   Low back pain 08/17/2014   Tendon nodule 08/17/2014   Episodic paroxysmal anxiety disorder 08/17/2014   Hernia, inguinal, right 08/17/2014   Fast heart beat 08/17/2014   Illness 08/17/2014   Inguinal hernia 10/14/2012    ONSET DATE: 11/09/2020  REFERRING DIAG: CVA  THERAPY DIAG:  Muscle weakness (generalized)  Other lack of coordination  H/O ischemic left MCA stroke  Rationale for Evaluation and Treatment Rehabilitation  PERTINENT HISTORY: Pt. is a 51 y.o. who was diagnosed with a CVA on July 21st, 2022. Pt. completed several weeks of  inpatient rehab at [facility]. After returning to home, Pt. sustained a fall in december of 2022, and was admitted to the hospital with COVI-19, and back pain from the fall. Since the most recent discharge, Pt. has been residing with the ex-wife until the Pt. is ready to return to independent living. Pt. PMHx includes: Bilateral LBP with right sided sciatica, Lumbosacral spondylosis myelopathy, closed compression Fx of L1. Pt. enjoys cooking, and  riding motorcycles.   PRECAUTIONS: Fall  SUBJECTIVE: Pt reports continued improvements with back pain. PAIN:  Are you having pain? Yes: NPRS scale: 2/10 Pain location: low back Pain description: aching Aggravating factors: prolonged sitting or lying down Relieving factors: rest, heat, walking   OBJECTIVE:  TODAY'S TREATMENT:    Therapeutic Exercise: Completed active assisted and active R palmar and radial abd, add, then 2 and 3 point pinch in  prep for grasp/release activities.  Performed passive supination and wrist flex/ext stretch for tone reduction in prep for wrist maze.   Neuromuscular Reeducation: Completed wrist maze x2 in R hand, rest breaks needed between each trial.    Pt used R hand to grasp a variety of small balls placed at table top level and released into container elevated from table top.  Focused on R thumb position into palmar abduction without slipping and dropping ball.  Pt ~50% accurate with his attempts this date.  Requiring min visual cues for technique and intermittent reps of active assisted thumb palmar abduction to facilitate correct prehension patterns for grasp/release.  Practiced picking up [place] but pt struggled to pick up without thumb slipping when disc was flat, so pt practiced picking up disc on its side for a more narrow grasp and switched to a 3 point pinch.    PATIENT EDUCATION: Education details: HEP progression, grasp/release activities  Person educated: Patient Education method: Explanation Education comprehension: verbalized and demonstrated understanding.      OT Short Term Goals -      OT SHORT TERM GOAL #1   Title Pt. wil demonstrtae independence with HEPs    Baseline Eval: Pt. currently does not have one, 10th visit:  changes in HEP ongoing; 20th: instructed in putty exercises, changes ongoing; 40th: continual updates to HEP as pt progresses   Time 6    Period Weeks    Status On-going    Target Date 11/20/21             OT Long Term Goals -       OT LONG TERM GOAL #1   Title Pt. will improve RUE strength by 2 mm grades to assist with ADLs, and IADLs,    Baseline Eval: Right shoulder flexion, abduction 3/5, elbow flexion, extension wrist extension 3+/5, 10th visit: improving with RUE strength by not yet met goal; 20th visit: R shd flex/abd 4-, elbow flex/ext 4+, wrist flex 4-, wrist ext 4+; 40:  R shd flex/abd 4,  elbow flex/ext 4+, wrist flex 4, wrist ext 4+   Time  12    Period Weeks    Status On-going    Target Date 01/25/22      OT LONG TERM GOAL #2   Title Pt. will improve right grip by 10 lbs to prepare for firmly holding objects for IADLs.    Baseline Eval: R: 38#, L: 124# (In 3rd dynamometer slot); R 35# in 4th slot, 28# in 3rd slot, 40th: 47 in 4th slot   Time 12    Period Weeks    Status On-going    Target Date 01/25/22      OT LONG TERM GOAL #3   Title Pt. will improve right lateral pinch strength by 5# to assist with cutting food    Baseline Eval: R: 17, L: 25; 20th: R: 18; 40th: 15# fingers slipping on meter   Time 12    Period Weeks    Status On-going    Target Date 01/25/22  OT LONG TERM GOAL #4   Title Pt. will improve Right 3pt pinch by 2# to be able to hold/open items for cooking    Baseline Eval: R: Pt. unable to engage thumb L: 29#; 20th: R unable; 40th:  unable, fingers slipping   Time 12    Period Weeks    Status On-going    Target Date 01/25/22      OT LONG TERM GOAL #5   Title Pt. will improve right hand [facility] skills to be able to independently manipulate buttons, and zippers.    Baseline Eval: Pt. has difficulty managing buttons,a nd zippers. 10th visit:  improving; 20th: unable to engage R hand effectively, performs 1 handed with L    Time 12    Period Weeks    Status On-going    Target Date 01/25/22      OT LONG TERM GOAL #6   Title Pt. will independently write his name    Baseline Eval: Pt. is unable to hold a pen; 20th: pt can grip PenAgain with R hand and can make marks on paper with difficulty, but not yet writing name; 40th:  continues to demonstrate difficulty with grasp of pen   Time 12    Period Weeks    Status On-going    Target Date 01/25/22      OT LONG TERM GOAL #7   Title Pt. will improve FOTO score by 2 points to reflect funational improvement    Baseline Eval: FOTO score 46 with TR score 52; 20th: 45; 40th:    Time 12    Period Weeks    Status On-going    Target Date 01/25/22               Plan -    Clinical Impression Statement Pt reported 2/10 pain in low back this date, which is the lowest he has reported.  Pt continues to respond well to moist heat on back throughout OT session while participating in table top activities for the RUE.  Pt continues to work towards  consistency with grasp/release activities with R thumb palmar and radial abduction and then working to improve 2 and 3 point pinch patterns, all without R thumb slipping.  Pt continues to show good carryover with HEP completion.  Pt continues to benefit from skilled OT services to maximize his safety and independence in necessary daily tasks.  Will continue to work towards neuro reed of right hand with emphasis on thumb movements to integrate into functional grasping and manipulation skills.    OT Occupational Profile and History Detailed Assessment- Review of Records and additional review of physical, cognitive, psychosocial history related to current functional performance    Occupational performance deficits (Please refer to evaluation for details): ADL's;IADL's;Education    Rehab Potential Good    Clinical Decision Making Several treatment options, min-mod task modification necessary    Comorbidities Affecting Occupational Performance: May have comorbidities impacting occupational performance    Modification or Assistance to Complete Evaluation  Min-Moderate modification of tasks or assist with assess necessary to complete eval    OT Frequency 2x / week    OT Duration 12 weeks    OT Treatment/Interventions Self-care/ADL training;DME and/or AE instruction;Therapeutic exercise;Ultrasound;Neuromuscular education;Therapeutic activities;Energy conservation;Moist Heat;Patient/family education;Splinting;Functional Mobility Training;Paraffin    Plan Neuro re-ed and therapeutic exercise    Consulted and Agree with Plan of Care Patient             Leta Speller, MS, OTR/L  Darleene Cleaver,  OT 11/20/2021,  3:37 PM

## 2021-11-23 ENCOUNTER — Ambulatory Visit: Payer: Medicare Other

## 2021-11-23 ENCOUNTER — Encounter: Payer: Medicare HMO | Admitting: Speech Pathology

## 2021-11-27 ENCOUNTER — Ambulatory Visit: Payer: Medicare Other

## 2021-11-27 ENCOUNTER — Encounter: Payer: Medicare HMO | Admitting: Speech Pathology

## 2021-11-27 DIAGNOSIS — M6281 Muscle weakness (generalized): Secondary | ICD-10-CM

## 2021-11-27 DIAGNOSIS — G8929 Other chronic pain: Secondary | ICD-10-CM

## 2021-11-27 DIAGNOSIS — R278 Other lack of coordination: Secondary | ICD-10-CM

## 2021-11-27 NOTE — Therapy (Signed)
OUTPATIENT OCCUPATIONAL THERAPY TREATMENT NOTE  Patient Name: David Reeves MRN: [id number] DOB:1971-04-21, 51 y.o., male Today's Date: 11/27/2021  PCP: [name], PA-C REFERRING PROVIDER: [name], PA-C  END OF SESSION:      OT End of Session - 11/27/21 0939     Visit Number 46    Number of Visits 43    Date for OT Re-Evaluation 01/25/22    Authorization Type Progress reporting period starting 11/02/2021    OT Start Time 0919    OT Stop Time 1000    OT Time Calculation (min) 41 min    Activity Tolerance Patient tolerated treatment well    Behavior During Therapy [facility] for tasks assessed/performed              Past Medical History:  Diagnosis Date   Back pain 04/22/2012   Bone spur    Bulging disc 04/22/2012   Degenerative disc disease    Osteoarthritis    Panic anxiety syndrome    Stroke ([place]) 10/2020   Taking multiple medications for chronic disease    Past Surgical History:  Procedure Laterality Date   CYSTECTOMY     head   HERNIA REPAIR Left 2006,2014   [facility]   TOE SURGERY Right 2007   Patient Active Problem List   Diagnosis Date Noted   Alterations of sensations following cerebrovascular accident 03/08/2021   Aphasia as late effect of cerebrovascular accident (CVA) 03/08/2021   Muscle spasm 02/22/2021   Acute non-recurrent frontal sinusitis 02/22/2021   Chronic pain syndrome 01/23/2021   Primary hypertension 01/23/2021   Nerve pain 01/23/2021   Erectile dysfunction due to diseases classified elsewhere 01/23/2021   Cognitive dysfunction 01/23/2021   Scrotal edema 01/23/2021   Elevated LDL cholesterol level 01/23/2021   History of stroke with residual deficit 01/23/2021   Primary insomnia 01/23/2021   Mouth pain 01/23/2021   Laceration of right hand without foreign body    Closed compression fracture of L1 vertebra ([place]) 05/24/2016   Lumbar stenosis with neurogenic claudication 11/01/2015   Chronic bilateral low back pain with  right-sided sciatica 11/08/2014   Hx of hemorrhoids 11/08/2014   AAA (abdominal aortic aneurysm) ([place]) 09/22/2014   Chronic pain associated with significant psychosocial dysfunction 09/22/2014   Panic disorder 09/22/2014   AB (asthmatic bronchitis) 08/17/2014   Anxiety 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 08/17/2014   Backache 08/17/2014   Lumbosacral spondylosis without myelopathy 08/17/2014   Disorder of male genital organs 08/17/2014   Brash 08/17/2014   Low back pain 08/17/2014   Tendon nodule 08/17/2014   Episodic paroxysmal anxiety disorder 08/17/2014   Hernia, inguinal, right 08/17/2014   Fast heart beat 08/17/2014   Illness 08/17/2014   Inguinal hernia 10/14/2012    ONSET DATE: 11/09/2020  REFERRING DIAG: CVA  THERAPY DIAG:  Muscle weakness (generalized)  Other lack of coordination  Rationale for Evaluation and Treatment Rehabilitation  PERTINENT HISTORY: Pt. is a 51 y.o. who was diagnosed with a CVA on July 21st, 2022. Pt. completed several weeks of  inpatient rehab at [facility]. After returning to home, Pt. sustained a fall in december of 2022, and was admitted to the hospital with COVI-19, and back pain from the fall. Since the most recent discharge, Pt. has been residing with the ex-wife until the Pt. is ready to return to independent living. Pt. PMHx includes: Bilateral LBP with right sided sciatica, Lumbosacral spondylosis myelopathy, closed compression Fx of L1. Pt. enjoys cooking, and riding motorcycles.   PRECAUTIONS: Fall  SUBJECTIVE: Pt reports he missed last Friday d/t being in so much pain from his back injection.  Feeling better today. PAIN:  Are you having pain? Yes: NPRS scale: 3/10 Pain location: low back Pain description: aching Aggravating factors: prolonged sitting or lying down Relieving factors: rest, heat, walking   OBJECTIVE:  TODAY'S TREATMENT:    Therapeutic Exercise: Completed active digit abd/add, digit extension with wrist blocked in  neutral, place and hold for R thumb radial abd x2 sets 10 each.  Performed 3 point pinch in prep for grasp/release activities using yellow resistive clip and OT providing support to keep thumb in position x2 sets 10 reps.  Performed passive wrist and digit extension and WB to R hand on tabletop intermittently between exercises noted above for tone reduction in R wrist and hand.  Neuromuscular Reeducation: Completed wrist maze x4 in R hand, rest breaks needed between each trial and cues to target wrist movement vs larger movements from the shoulder that pt was using to compensate.  Practiced 3 point pinch and grasp release of jumbo peg.  Pt was successful to move 4 pegs from L side of pegboard to the R without thumb slipping.  Multiple attempts and hand over hand assist to set up digits into 3 point pinch pattern on peg for initial successful placement, then subsequent vc for technique.  Rest breaks needed between each peg placement.     PATIENT EDUCATION: Education details: HEP progression, grasp/release activities  Person educated: Patient Education method: Explanation Education comprehension: verbalized and demonstrated understanding.      OT Short Term Goals -      OT SHORT TERM GOAL #1   Title Pt. wil demonstrtae independence with HEPs    Baseline Eval: Pt. currently does not have one, 10th visit:  changes in HEP ongoing; 20th: instructed in putty exercises, changes ongoing; 40th: continual updates to HEP as pt progresses   Time 6    Period Weeks    Status On-going    Target Date 11/20/21             OT Long Term Goals -       OT LONG TERM GOAL #1   Title Pt. will improve RUE strength by 2 mm grades to assist with ADLs, and IADLs,    Baseline Eval: Right shoulder flexion, abduction 3/5, elbow flexion, extension wrist extension 3+/5, 10th visit: improving with RUE strength by not yet met goal; 20th visit: R shd flex/abd 4-, elbow flex/ext 4+, wrist flex 4-, wrist ext 4+; 40:  R shd  flex/abd 4,  elbow flex/ext 4+, wrist flex 4, wrist ext 4+   Time 12    Period Weeks    Status On-going    Target Date 01/25/22      OT LONG TERM GOAL #2   Title Pt. will improve right grip by 10 lbs to prepare for firmly holding objects for IADLs.    Baseline Eval: R: 38#, L: 124# (In 3rd dynamometer slot); R 35# in 4th slot, 28# in 3rd slot, 40th: 47 in 4th slot   Time 12    Period Weeks    Status On-going    Target Date 01/25/22      OT LONG TERM GOAL #3   Title Pt. will improve right lateral pinch strength by 5# to assist with cutting food    Baseline Eval: R: 17, L: 25; 20th: R: 18; 40th: 15# fingers slipping on meter   Time 12    Period  Weeks    Status On-going    Target Date 01/25/22      OT LONG TERM GOAL #4   Title Pt. will improve Right 3pt pinch by 2# to be able to hold/open items for cooking    Baseline Eval: R: Pt. unable to engage thumb L: 29#; 20th: R unable; 40th:  unable, fingers slipping   Time 12    Period Weeks    Status On-going    Target Date 01/25/22      OT LONG TERM GOAL #5   Title Pt. will improve right hand [facility] skills to be able to independently manipulate buttons, and zippers.    Baseline Eval: Pt. has difficulty managing buttons,a nd zippers. 10th visit:  improving; 20th: unable to engage R hand effectively, performs 1 handed with L    Time 12    Period Weeks    Status On-going    Target Date 01/25/22      OT LONG TERM GOAL #6   Title Pt. will independently write his name    Baseline Eval: Pt. is unable to hold a pen; 20th: pt can grip PenAgain with R hand and can make marks on paper with difficulty, but not yet writing name; 40th:  continues to demonstrate difficulty with grasp of pen   Time 12    Period Weeks    Status On-going    Target Date 01/25/22      OT LONG TERM GOAL #7   Title Pt. will improve FOTO score by 2 points to reflect funational improvement    Baseline Eval: FOTO score 46 with TR score 52; 20th: 45; 40th:    Time 12     Period Weeks    Status On-going    Target Date 01/25/22              Plan -    Clinical Impression Statement Pt was successful to move 4 pegs from L side of pegboard to the R without thumb slipping using a 3 point pinch pattern.  Multiple attempts and hand over hand assist to set up digits into 3 point pinch pattern on peg for initial successful placement, then subsequent vc for technique.  Rest breaks needed between each peg placement.  Pt is improving with isolating distal movement while reducing proximal movement when performing table top FM tasks with the R hand.    Pt continues to work towards  consistency with grasp/release activities with R thumb palmar and radial abduction and then working to improve 2 and 3 point pinch patterns, all without R thumb slipping.  Pt continues to show good carryover with HEP completion.  Pt continues to benefit from skilled OT services to maximize his safety and independence in necessary daily tasks.  Will continue to work towards neuro reed of right hand with emphasis on thumb movements to integrate into functional grasping and manipulation skills.    OT Occupational Profile and History Detailed Assessment- Review of Records and additional review of physical, cognitive, psychosocial history related to current functional performance    Occupational performance deficits (Please refer to evaluation for details): ADL's;IADL's;Education    Rehab Potential Good    Clinical Decision Making Several treatment options, min-mod task modification necessary    Comorbidities Affecting Occupational Performance: May have comorbidities impacting occupational performance    Modification or Assistance to Complete Evaluation  Min-Moderate modification of tasks or assist with assess necessary to complete eval    OT Frequency 2x / week  OT Duration 12 weeks    OT Treatment/Interventions Self-care/ADL training;DME and/or AE instruction;Therapeutic  exercise;Ultrasound;Neuromuscular education;Therapeutic activities;Energy conservation;Moist Heat;Patient/family education;Splinting;Functional Mobility Training;Paraffin    Plan Neuro re-ed and therapeutic exercise    Consulted and Agree with Plan of Care Patient             Leta Speller, MS, OTR/L  Darleene Cleaver, OT 11/27/2021, 9:41 AM

## 2021-11-27 NOTE — Therapy (Signed)
[place] [facility] SERVICES [address] [place], [place], [address] Phone: [phone]   Fax:  [phone]  Patient Details  Name: David Reeves MRN: [id number] Date of Birth: [birth year] Referring Provider:  [name], PA-C  Encounter Date: 11/27/2021   OUTPATIENT PHYSICAL THERAPY TREATMENT NOTE     Patient Name: David Reeves MRN: [id number] DOB:[birth year], 51 y.o., male Today's Date: 11/27/2021  PCP: [name], PA-C REFERRING PROVIDER: [name], PA-C   PT End of Session - 11/27/21 1105     Visit Number 38    Number of Visits 51    Date for PT Re-Evaluation 02/05/22    Authorization Type [employer]- authorized 16 visits 4/4-6/1    Authorization - Visit Number 5    Authorization - Number of Visits 16    Progress Note Due on Visit 61    PT Start Time 1017    PT Stop Time 1057    PT Time Calculation (min) 40 min    Activity Tolerance Patient tolerated treatment well;No increased pain    Behavior During Therapy [facility] for tasks assessed/performed               Past Medical History:  Diagnosis Date   Back pain 04/22/2012   Bone spur    Bulging disc 04/22/2012   Degenerative disc disease    Osteoarthritis    Panic anxiety syndrome    Stroke ([place]) 10/2020   Taking multiple medications for chronic disease    Past Surgical History:  Procedure Laterality Date   CYSTECTOMY     head   HERNIA REPAIR Left 2006,2014   [facility]   TOE SURGERY Right 2007   Patient Active Problem List   Diagnosis Date Noted   Alterations of sensations following cerebrovascular accident 03/08/2021   Aphasia as late effect of cerebrovascular accident (CVA) 03/08/2021   Muscle spasm 02/22/2021   Acute non-recurrent frontal sinusitis 02/22/2021   Chronic pain syndrome 01/23/2021   Primary hypertension 01/23/2021   Nerve pain 01/23/2021   Erectile dysfunction due to diseases classified elsewhere 01/23/2021    Cognitive dysfunction 01/23/2021   Scrotal edema 01/23/2021   Elevated LDL cholesterol level 01/23/2021   History of stroke with residual deficit 01/23/2021   Primary insomnia 01/23/2021   Mouth pain 01/23/2021   Laceration of right hand without foreign body    Closed compression fracture of L1 vertebra ([place]) 05/24/2016   Lumbar stenosis with neurogenic claudication 11/01/2015   Chronic 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 11/08/2014   Hx of hemorrhoids 11/08/2014   AAA (abdominal aortic aneurysm) ([place]) 09/22/2014   Chronic pain associated with significant psychosocial dysfunction 09/22/2014   Panic disorder 09/22/2014   AB (asthmatic bronchitis) 08/17/2014   Anxiety 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 08/17/2014   Backache 08/17/2014   Lumbosacral spondylosis without myelopathy 08/17/2014   Disorder of male genital organs 08/17/2014   Brash 08/17/2014   Low back pain 08/17/2014   Tendon nodule 08/17/2014   Episodic paroxysmal anxiety disorder 08/17/2014   Hernia, inguinal, right 08/17/2014   Fast heart beat 08/17/2014   Illness 08/17/2014   Inguinal hernia 10/14/2012    REFERRING DIAG: History of stroke with residual deficit, chronic 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, Lumbosacral spondylosis without myelopathy THERAPY DIAG:  Chronic bilateral low back pain without sciatica  Muscle weakness (generalized)  Rationale for Evaluation and Treatment Rehabilitation  PERTINENT HISTORY: Pt. is a 51 y.o. who was diagnosed with a  CVA on July 21st, 2022. Pt. completed several weeks of inpatient rehab at [facility]. After returning to home. He was discharged in late August/early September and recieved home health PT. Pt. sustained a fall in december of 2022, and was admitted to the hospital with COVID-19, and Chronic back pain. He reports chronic back pain syndrome since 2012. Since the most recent discharge, Pt. has been residing with the ex-wife until the Pt. is  ready to return to independent living. He did recieve 2 weeks of home health after discharge in December 2022. He is now being referred to outpatient PT to address weakness from stroke and improve fine motor movement. He reports rarely getting numbness/tingling in RLE. PMHx includes: Bilateral LBP with right sided sciatica, Lumbosacral spondylosis myelopathy, closed compression Fx of L1. Has chronic insomnia. osteoarthritis, Panic anxiety syndrome (taking medication) Pt. enjoys cooking, and riding motorcycles. Patient is going to [facility] spine center for Epidural spinal injections on 06/07/21;  PRECAUTIONS: Fall, Spinal Brace wears daily, approximately 25%, when exercising; semi-rigid lumbar brace  SUBJECTIVE:  Pt had trigger point injections recently. He reports he does not currently have restrictions. He feels the injections are helping. Reports pain currently 3/10.   PAIN:  Are you having pain? Yes: NPRS scale: 3/10 Pain location: bilat low back Pain description: dull ache; always constant,  Aggravating factors: worse with sitting,  Relieving factors: heat helps temporarily, pain pills/pain patch   TODAY'S TREATMENT:    11/27/2021   TherEx: Pt supine on plinth with heat donned to pt's low back while pt performs interventions. Skin WNL/at baseline prior to application and following removal of heat  LTRx 20x  Bridges 15x. Rates medium.  Single leg bridge 5x each LE. Pt rates medium-hard  Green p.ball hamstring curls 15x 2 sets. Rates medium.  LTR 10x between sets  PPT with TrA activation and LE march, 7# weights donned 20x each LE. Pt has some difficulty with technique.  SLR with 7# weights donned 15x each LE. Additionally pt performed ankle ankle pumps. Rates difficult  LTRs 10x  Pt reports no increase in lower back pain levels at end of session today, pain remains at baseline  PATIENT EDUCATION:  Education details: exercise technique, body mechanics Person educated:  Patient Education method: Explanation, Demonstration, Tactile cues, and Verbal cues Education comprehension: verbalized understanding, returned demonstration, verbal cues required, tactile cues required, and needs further education   HOME EXERCISE PROGRAM:   No updates on this date pt to continue HEP as previously given     PT Short Term Goals -       PT SHORT TERM GOAL #1   Title Patient will be adherent to HEP at least 3x a week to improve functional strength and balance for better safety at home.    Baseline 4/4: doing them 1-2x a week; 08/20/2021=Patient reports performing his low back stretching and no questions currently HEP. 10/24/21: doing HEP 4x a week   Time 4    Period Weeks    Status Achieved    Target Date 08/21/21      PT SHORT TERM GOAL #2   Title Patient (< 106 years old) will complete five times sit to stand test in < 15 seconds indicating an increased LE strength and improved balance.    Baseline 4/4: 17.85 sec with arms across chest; 08/20/2021= 17.6 sec , 7/5: 12.85 sec   Time 4    Period Weeks    Status Achieved;    Target Date 10/01/21  PT Long Term Goals -       PT LONG TERM GOAL #1   Title Patient will increase six minute walk test distance to >1000 for progression to community ambulator and improve gait ability    Baseline 4/4: 1115 feet , 7/5: 1080 feet;    Time 4    Period Weeks    Status Achieved    Target Date 08/21/21      PT LONG TERM GOAL #2   Title Patient will ascend/descend 8 stairs without rail assist independently without loss of balance to improve ability to get in/out of home.    Baseline 4/4: requires 1 rail assist; 08/20/2021- Patient demonstrated steps without railing using reciprocal steps today. 7/5: ascend/descend 6 steps reciprocally without rail, but required CGA for safety when descending; 7/25: Able to complete without UE support and good balance going up steps, unsafe descending steps without UE support. Required min  assist and UE support to prevent LOB descending.   Time 4    Period Weeks    Status On-going    Target Date 12/11/2021      PT LONG TERM GOAL #3   Title Patient will increase BLE gross strength to 4+/5 as to improve functional strength for independent gait, increased standing tolerance and increased ADL ability.    Baseline 4/4: not formally assessed; 08/20/2021= 4+/5 with Right hip flex/knee ext/flex; Right hip abd= 4/5, 7/5: 4+/5 hip    Time 12    Period Weeks    Status Achieved;    Target Date 11/12/21      PT LONG TERM GOAL #4   Title Patient will improve FOTO score to >50% to indicate improved functional mobility with less pain with ADLs.    Baseline 4/4: 35%; 08/20/2021= 42% 7/5: 47%; 7:25:recently addressed PN   Time 12    Period Weeks    Status Partially Met   Target Date 02/05/2022      PT LONG TERM GOAL #5   Title Patient will report a worst pain of 4/10 in low back over last week to indicate improved tolerance with ADLs.    Baseline 4/4: 7/10; 08/20/2021= 4/10 , 7/5: worst 7/10 in low back; 7/25: 6/10    Time 12    Period Weeks    Status  02/05/2022   Target Date 11/12/21      Additional Long Term Goals   Additional Long Term Goals Yes      PT LONG TERM GOAL #6   Title Pt will increase 10MWT by at least 0.13 m/s in order to demonstrate clinically significant improvement in community ambulation    Baseline 08/20/2021=0.94 m/s 7/5: 1.5 m/s   Time 12    Period Weeks    Status Achieved;    Target Date 11/12/21      PT LONG TERM GOAL #7   Title Patient will demonstrate improved static standing balance as seen by ability to single leg stance on right LE > 12 sec consistently for optimal balance on level and unlevel surfaces.    Baseline 08/20/2021= 4 sec SLS on right , 7/5: 4-5 sec inconsistent; 7/25 able to achieve 15 sec after multiple attempts of 4-5 sec holds   Time 12    Period Weeks    Status Partially Met   Target Date 02/05/2022              Plan -      Clinical Impression Statement Pt continues to tolerate increased level of resistance with  therex well and without increase in back pain. He rates majority of interventions as medium-hard. Pt had most difficulty with using correct technique with supine march. PT provide cuing but pt only intermittently able to correct. The pt will benefit from further skilled PT to improve strength and balance, and decrease pain levels.   Personal Factors and Comorbidities Comorbidity 3+;Past/Current Experience;Time since onset of injury/illness/exacerbation    Comorbidities PMHx includes: Bilateral LBP with right sided sciatica, Lumbosacral spondylosis myelopathy, closed compression Fx of L1. Has chronic insomnia. osteoarthritis, Panic anxiety syndrome (taking medication)    Examination-Activity Limitations Bend;Carry;Locomotion Level;Sit;Squat;Stairs;Stand;Transfers    Examination-Participation Restrictions Cleaning;Community Activity;Driving;Laundry;Meal Prep;Occupation;Shop;Volunteer;Yard Work    Stability/Clinical Decision Making Stable/Uncomplicated    Rehab Potential Good    PT Frequency 2x / week    PT Duration 12 weeks    PT Treatment/Interventions Cryotherapy;Electrical Stimulation;Moist Heat;Traction;Gait training;Stair training;Functional mobility training;Therapeutic activities;Therapeutic exercise;Balance training;Neuromuscular re-education;Patient/family education;Manual techniques;Passive range of motion;Dry needling;Energy conservation    PT Next Visit Plan  Progress core stabilization, Manual therapy for low back and LE ROM, instruct in balance HEP, continue plan       Consulted and Agree with Plan of Care Patient              Zollie Pee, PT 11/27/2021, 11:16 AM  [place] [address] [place], [place], [address] Phone: [phone]   Fax:  [phone]

## 2021-11-30 ENCOUNTER — Encounter: Payer: Self-pay | Admitting: Occupational Therapy

## 2021-11-30 ENCOUNTER — Ambulatory Visit: Payer: Medicare Other | Admitting: Occupational Therapy

## 2021-11-30 ENCOUNTER — Encounter: Payer: Medicare HMO | Admitting: Speech Pathology

## 2021-11-30 ENCOUNTER — Ambulatory Visit: Payer: Medicare Other

## 2021-11-30 DIAGNOSIS — M545 Low back pain, unspecified: Secondary | ICD-10-CM

## 2021-11-30 DIAGNOSIS — R482 Apraxia: Secondary | ICD-10-CM

## 2021-11-30 DIAGNOSIS — M6281 Muscle weakness (generalized): Secondary | ICD-10-CM

## 2021-11-30 DIAGNOSIS — R278 Other lack of coordination: Secondary | ICD-10-CM

## 2021-11-30 DIAGNOSIS — R269 Unspecified abnormalities of gait and mobility: Secondary | ICD-10-CM

## 2021-11-30 DIAGNOSIS — R2681 Unsteadiness on feet: Secondary | ICD-10-CM

## 2021-11-30 DIAGNOSIS — F09 Unspecified mental disorder due to known physiological condition: Secondary | ICD-10-CM

## 2021-11-30 DIAGNOSIS — Z8673 Personal history of transient ischemic attack (TIA), and cerebral infarction without residual deficits: Secondary | ICD-10-CM

## 2021-11-30 DIAGNOSIS — R262 Difficulty in walking, not elsewhere classified: Secondary | ICD-10-CM

## 2021-11-30 NOTE — Therapy (Signed)
[place] MAIN [facility] SERVICES [address] [place], [place], [address] Phone: [phone]   Fax:  [phone]  Patient Details  Name: David Reeves MRN: [id number] Date of Birth: Apr 24, 1970 Referring Provider:  [name], PA-C  Encounter Date: 11/30/2021   OUTPATIENT PHYSICAL THERAPY TREATMENT NOTE     Patient Name: David Reeves MRN: [id number] DOB:1971-04-19, 51 y.o., male Today's Date: 11/30/2021  PCP: [name], PA-C REFERRING PROVIDER: [name], PA-C   PT End of Session - 11/30/21 1031     Visit Number 39    Number of Visits 17    Date for PT Re-Evaluation 02/05/22    Authorization Type [employer]    Progress Note Due on Visit 47    PT Start Time 1015    PT Stop Time 1055    PT Time Calculation (min) 40 min    Equipment Utilized During Treatment Gait belt    Activity Tolerance Patient tolerated treatment well;No increased pain    Behavior During Therapy [facility] for tasks assessed/performed               Past Medical History:  Diagnosis Date   Back pain 04/22/2012   Bone spur    Bulging disc 04/22/2012   Degenerative disc disease    Osteoarthritis    Panic anxiety syndrome    Stroke ([place]) 10/2020   Taking multiple medications for chronic disease    Past Surgical History:  Procedure Laterality Date   CYSTECTOMY     head   HERNIA REPAIR Left 2006,2014   [facility]   TOE SURGERY Right 2007   Patient Active Problem List   Diagnosis Date Noted   Alterations of sensations following cerebrovascular accident 03/08/2021   Aphasia as late effect of cerebrovascular accident (CVA) 03/08/2021   Muscle spasm 02/22/2021   Acute non-recurrent frontal sinusitis 02/22/2021   Chronic pain syndrome 01/23/2021   Primary hypertension 01/23/2021   Nerve pain 01/23/2021   Erectile dysfunction due to diseases classified elsewhere 01/23/2021   Cognitive dysfunction 01/23/2021   Scrotal edema  01/23/2021   Elevated LDL cholesterol level 01/23/2021   History of stroke with residual deficit 01/23/2021   Primary insomnia 01/23/2021   Mouth pain 01/23/2021   Laceration of right hand without foreign body    Closed compression fracture of L1 vertebra ([place]) 05/24/2016   Lumbar stenosis with neurogenic claudication 11/01/2015   Chronic 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 11/08/2014   Hx of hemorrhoids 11/08/2014   AAA (abdominal aortic aneurysm) ([place]) 09/22/2014   Chronic pain associated with significant psychosocial dysfunction 09/22/2014   Panic disorder 09/22/2014   AB (asthmatic bronchitis) 08/17/2014   Anxiety 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 08/17/2014   Backache 08/17/2014   Lumbosacral spondylosis without myelopathy 08/17/2014   Disorder of male genital organs 08/17/2014   Brash 08/17/2014   Low back pain 08/17/2014   Tendon nodule 08/17/2014   Episodic paroxysmal anxiety disorder 08/17/2014   Hernia, inguinal, right 08/17/2014   Fast heart beat 08/17/2014   Illness 08/17/2014   Inguinal hernia 10/14/2012    REFERRING DIAG: History of stroke with residual deficit, chronic 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, Lumbosacral spondylosis without myelopathy THERAPY DIAG:  Muscle weakness (generalized)  Other lack of coordination  Unsteadiness on feet  Apraxia  Chronic bilateral low back pain without sciatica  H/O ischemic left MCA stroke  H/O ischemic left ACA stroke  Cognitive dysfunction  Abnormality of gait and mobility  Difficulty in walking, not elsewhere classified  Rationale for Evaluation and Treatment Rehabilitation  PERTINENT HISTORY: Pt. is a 51 y.o. who was diagnosed with a CVA on July 21st, 2022. Pt. completed several weeks of inpatient rehab at [facility]. After returning to home. He was discharged in late August/early September and recieved home health PT. Pt. sustained a fall in december of 2022, and was admitted to  the hospital with COVID-19, and Chronic back pain. He reports chronic back pain syndrome since 2012. Since the most recent discharge, Pt. has been residing with the ex-wife until the Pt. is ready to return to independent living. He did recieve 2 weeks of home health after discharge in December 2022. He is now being referred to outpatient PT to address weakness from stroke and improve fine motor movement. He reports rarely getting numbness/tingling in RLE. PMHx includes: Bilateral LBP with right sided sciatica, Lumbosacral spondylosis myelopathy, closed compression Fx of L1. Has chronic insomnia. osteoarthritis, Panic anxiety syndrome (taking medication) Pt. enjoys cooking, and riding motorcycles. Patient is going to [facility] center for Epidural spinal injections on 06/07/21;  PRECAUTIONS: Fall, Spinal Brace wears daily, approximately 25%, when exercising; semi-rigid lumbar brace  SUBJECTIVE:  Pain doing ok today, reports he thinks the trigger point injection has worn off some. No additional updates. Has been working on sidestepping at home. He feels his body healing and getting stronger.   PAIN:  Are you having pain? Yes: NPRS scale: 3/10 Pain location: bilat low back Pain description: dull ache; always constant,  Aggravating factors: worse with sitting,  Relieving factors: heat helps temporarily, pain pills/pain patch   TODAY'S TREATMENT 11/30/21 Pt supine on plinth with heat donned to pt's low back while pt performs interventions. Skin    LTRx 20x Hooklying Bridges 1x15 Hooklying Marching 1x10  Hooklying Bridges 1x15 Hooklying SLR 7.5lb AW 1x15 bilat   FOTO Survey: 35               Tandem Stance balance 5x15sec in // bars  (looks good!)  Fwd step steps, emphasis on light taps and good control for SLS training 1x20 alternating, hadns free Lateral side stepping in // bars 3 lengths bilat      PATIENT EDUCATION:  Education details: exercise technique, body mechanics Person educated:  Patient Education method: Explanation, Demonstration, Tactile cues, and Verbal cues Education comprehension: verbalized understanding, returned demonstration, verbal cues required, tactile cues required, and needs further education   HOME EXERCISE PROGRAM:   No updates on this date pt to continue HEP as previously given     PT Short Term Goals -       PT SHORT TERM GOAL #1   Title Patient will be adherent to HEP at least 3x a week to improve functional strength and balance for better safety at home.    Baseline 4/4: doing them 1-2x a week; 08/20/2021=Patient reports performing his low back stretching and no questions currently HEP. 10/24/21: doing HEP 4x a week   Time 4    Period Weeks    Status Achieved    Target Date 08/21/21      PT SHORT TERM GOAL #2   Title Patient (< 63 years old) will complete five times sit to stand test in < 15 seconds indicating an increased LE strength and improved balance.    Baseline 4/4: 17.85 sec with arms across chest; 08/20/2021= 17.6 sec , 7/5: 12.85 sec   Time 4    Period Weeks    Status Achieved;  Target Date 10/01/21              PT Long Term Goals -       PT LONG TERM GOAL #1   Title Patient will increase six minute walk test distance to >1000 for progression to community ambulator and improve gait ability    Baseline 4/4: 1115 feet , 7/5: 1080 feet;    Time 4    Period Weeks    Status Achieved    Target Date 08/21/21      PT LONG TERM GOAL #2   Title Patient will ascend/descend 8 stairs without rail assist independently without loss of balance to improve ability to get in/out of home.    Baseline 4/4: requires 1 rail assist; 08/20/2021- Patient demonstrated steps without railing using reciprocal steps today. 7/5: ascend/descend 6 steps reciprocally without rail, but required CGA for safety when descending; 7/25: Able to complete without UE support and good balance going up steps, unsafe descending steps without UE support. Required min  assist and UE support to prevent LOB descending.   Time 4    Period Weeks    Status On-going    Target Date 12/11/2021      PT LONG TERM GOAL #3   Title Patient will increase BLE gross strength to 4+/5 as to improve functional strength for independent gait, increased standing tolerance and increased ADL ability.    Baseline 4/4: not formally assessed; 08/20/2021= 4+/5 with Right hip flex/knee ext/flex; Right hip abd= 4/5, 7/5: 4+/5 hip    Time 12    Period Weeks    Status Achieved;    Target Date 11/12/21      PT LONG TERM GOAL #4   Title Patient will improve FOTO score to >50% to indicate improved functional mobility with less pain with ADLs.    Baseline 4/4: 35%; 08/20/2021= 42% 7/5: 47%; 7:25:recently addressed; 11/30/21: 35   Time 12    Period Weeks    Status Partially Met   Target Date 02/05/2022      PT LONG TERM GOAL #5   Title Patient will report a worst pain of 4/10 in low back over last week to indicate improved tolerance with ADLs.    Baseline 4/4: 7/10; 08/20/2021= 4/10 , 7/5: worst 7/10 in low back; 7/25: 6/10    Time 12    Period Weeks    Status  02/05/2022   Target Date 11/12/21      Additional Long Term Goals   Additional Long Term Goals Yes      PT LONG TERM GOAL #6   Title Pt will increase 10MWT by at least 0.13 m/s in order to demonstrate clinically significant improvement in community ambulation    Baseline 08/20/2021=0.94 m/s 7/5: 1.5 m/s   Time 12    Period Weeks    Status Achieved;    Target Date 11/12/21      PT LONG TERM GOAL #7   Title Patient will demonstrate improved static standing balance as seen by ability to single leg stance on right LE > 12 sec consistently for optimal balance on level and unlevel surfaces.    Baseline 08/20/2021= 4 sec SLS on right , 7/5: 4-5 sec inconsistent; 7/25 able to achieve 15 sec after multiple attempts of 4-5 sec holds   Time 12    Period Weeks    Status Partially Met   Target Date 02/05/2022              Plan -  Clinical Impression Statement Pt continues to tolerate increased level of resistance with therex well back pain managed adequately. He rates majority of interventions as medium-hard. Pt somewhat drowsy in appearance today, but attends to activities well. Application of heat remains simply a fantastic adjunct to promoting tolerance in movement. Balance interventions today show great progress in motor control, very few LOB. The pt will benefit from further skilled PT to improve strength and balance, and decrease pain levels.   Personal Factors and Comorbidities Comorbidity 3+;Past/Current Experience;Time since onset of injury/illness/exacerbation    Comorbidities PMHx includes: Bilateral LBP with right sided sciatica, Lumbosacral spondylosis myelopathy, closed compression Fx of L1. Has chronic insomnia. osteoarthritis, Panic anxiety syndrome (taking medication)    Examination-Activity Limitations Bend;Carry;Locomotion Level;Sit;Squat;Stairs;Stand;Transfers    Examination-Participation Restrictions Cleaning;Community Activity;Driving;Laundry;Meal Prep;Occupation;Shop;Volunteer;Yard Work    Stability/Clinical Decision Making Stable/Uncomplicated    Rehab Potential Good    PT Frequency 2x / week    PT Duration 12 weeks    PT Treatment/Interventions Cryotherapy;Electrical Stimulation;Moist Heat;Traction;Gait training;Stair training;Functional mobility training;Therapeutic activities;Therapeutic exercise;Balance training;Neuromuscular re-education;Patient/family education;Manual techniques;Passive range of motion;Dry needling;Energy conservation    PT Next Visit Plan  Progress core stabilization, Manual therapy for low back and LE ROM, instruct in balance HEP, continue plan       Consulted and Agree with Plan of Care Patient            10:36 AM, 11/30/21 Etta Grandchild, PT, DPT Physical Therapist - [facility]  Outpatient Physical Therapy- [place] [phone]     [place], PT 11/30/2021, 10:36 AM  [place] [address] [place], [place], [address] Phone: [phone]   Fax:  [phone]

## 2021-11-30 NOTE — Therapy (Signed)
OUTPATIENT OCCUPATIONAL THERAPY TREATMENT NOTE  Patient Name: David Reeves MRN: [id number] DOB:1970/05/17, 51 y.o., male Today's Date: 11/30/2021  PCP: [name], PA-C REFERRING PROVIDER: [name], PA-C  END OF SESSION:      OT End of Session - 11/30/21 0921     Visit Number 53    Number of Visits 3    Date for OT Re-Evaluation 01/25/22    Authorization Type Progress reporting period starting 11/02/2021    OT Start Time 0917    OT Stop Time 1005    OT Time Calculation (min) 48 min    Activity Tolerance Patient tolerated treatment well    Behavior During Therapy [facility] for tasks assessed/performed              Past Medical History:  Diagnosis Date   Back pain 04/22/2012   Bone spur    Bulging disc 04/22/2012   Degenerative disc disease    Osteoarthritis    Panic anxiety syndrome    Stroke ([place]) 10/2020   Taking multiple medications for chronic disease    Past Surgical History:  Procedure Laterality Date   CYSTECTOMY     head   HERNIA REPAIR Left 2006,2014   [facility]   TOE SURGERY Right 2007   Patient Active Problem List   Diagnosis Date Noted   Alterations of sensations following cerebrovascular accident 03/08/2021   Aphasia as late effect of cerebrovascular accident (CVA) 03/08/2021   Muscle spasm 02/22/2021   Acute non-recurrent frontal sinusitis 02/22/2021   Chronic pain syndrome 01/23/2021   Primary hypertension 01/23/2021   Nerve pain 01/23/2021   Erectile dysfunction due to diseases classified elsewhere 01/23/2021   Cognitive dysfunction 01/23/2021   Scrotal edema 01/23/2021   Elevated LDL cholesterol level 01/23/2021   History of stroke with residual deficit 01/23/2021   Primary insomnia 01/23/2021   Mouth pain 01/23/2021   Laceration of right hand without foreign body    Closed compression fracture of L1 vertebra ([place]) 05/24/2016   Lumbar stenosis with neurogenic claudication 11/01/2015   Chronic bilateral low back pain with  right-sided sciatica 11/08/2014   Hx of hemorrhoids 11/08/2014   AAA (abdominal aortic aneurysm) ([place]) 09/22/2014   Chronic pain associated with significant psychosocial dysfunction 09/22/2014   Panic disorder 09/22/2014   AB (asthmatic bronchitis) 08/17/2014   Anxiety 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 08/17/2014   Backache 08/17/2014   Lumbosacral spondylosis without myelopathy 08/17/2014   Disorder of male genital organs 08/17/2014   Brash 08/17/2014   Low back pain 08/17/2014   Tendon nodule 08/17/2014   Episodic paroxysmal anxiety disorder 08/17/2014   Hernia, inguinal, right 08/17/2014   Fast heart beat 08/17/2014   Illness 08/17/2014   Inguinal hernia 10/14/2012    ONSET DATE: 11/09/2020  REFERRING DIAG: CVA  THERAPY DIAG:  Muscle weakness (generalized)  Other lack of coordination  Unsteadiness on feet  Apraxia  Rationale for Evaluation and Treatment Rehabilitation  PERTINENT HISTORY: Pt. is a 51 y.o. who was diagnosed with a CVA on July 21st, 2022. Pt. completed several weeks of  inpatient rehab at [facility]. After returning to home, Pt. sustained a fall in december of 2022, and was admitted to the hospital with COVI-19, and back pain from the fall. Since the most recent discharge, Pt. has been residing with the ex-wife until the Pt. is ready to return to independent living. Pt. PMHx includes: Bilateral LBP with right sided sciatica, Lumbosacral spondylosis myelopathy, closed compression Fx of L1. Pt. enjoys cooking, and  riding motorcycles.   PRECAUTIONS: Fall  SUBJECTIVE: Pt reports he is having a good day today, pain in back 2/10.   PAIN:  Are you having pain? Yes: NPRS scale: 2/10 Pain location: low back Pain description: aching Aggravating factors: prolonged sitting or lying down Relieving factors: rest, heat, walking   OBJECTIVE:  TODAY'S TREATMENT:    Therapeutic Exercise:  Performed passive wrist and digit extension and WB to R hand on tabletop  intermittently between exercises noted  for tone reduction in R wrist and hand.  Neuromuscular Reeducation: Pt seen for facilitation of grasp and release patterns with use of Velcro 1 inch squares, difficulty with removing from board, switched to working on grasping pattern with placing with thumb and finger combination onto cube with assist from therapist.   Use of PVC plumber pieces-Pt working towards stabilizing object in hand, therapist helping to facilitate right thumb rotation and placement onto piece for thumb and finger combinations for grasping.  Use of bilateral hands to hold a piece in each hand and bring together for assembly.   Facilitation of grasp and release of objects with assist and use of tapping for finger extension, with use pinch pins from flat surface then reaching to place into bucket.  Rest breaks as needed and stretches to decrease flexor tone during task.     PATIENT EDUCATION: Education details: HEP progression, grasp/release activities  Person educated: Patient Education method: Explanation Education comprehension: verbalized and demonstrated understanding.      OT Short Term Goals -      OT SHORT TERM GOAL #1   Title Pt. wil demonstrtae independence with HEPs    Baseline Eval: Pt. currently does not have one, 10th visit:  changes in HEP ongoing; 20th: instructed in putty exercises, changes ongoing; 40th: continual updates to HEP as pt progresses   Time 6    Period Weeks    Status On-going    Target Date 11/20/21             OT Long Term Goals -       OT LONG TERM GOAL #1   Title Pt. will improve RUE strength by 2 mm grades to assist with ADLs, and IADLs,    Baseline Eval: Right shoulder flexion, abduction 3/5, elbow flexion, extension wrist extension 3+/5, 10th visit: improving with RUE strength by not yet met goal; 20th visit: R shd flex/abd 4-, elbow flex/ext 4+, wrist flex 4-, wrist ext 4+; 40:  R shd flex/abd 4,  elbow flex/ext 4+, wrist flex 4,  wrist ext 4+   Time 12    Period Weeks    Status On-going    Target Date 01/25/22      OT LONG TERM GOAL #2   Title Pt. will improve right grip by 10 lbs to prepare for firmly holding objects for IADLs.    Baseline Eval: R: 38#, L: 124# (In 3rd dynamometer slot); R 35# in 4th slot, 28# in 3rd slot, 40th: 47 in 4th slot   Time 12    Period Weeks    Status On-going    Target Date 01/25/22      OT LONG TERM GOAL #3   Title Pt. will improve right lateral pinch strength by 5# to assist with cutting food    Baseline Eval: R: 17, L: 25; 20th: R: 18; 40th: 15# fingers slipping on meter   Time 12    Period Weeks    Status On-going    Target Date 01/25/22  OT LONG TERM GOAL #4   Title Pt. will improve Right 3pt pinch by 2# to be able to hold/open items for cooking    Baseline Eval: R: Pt. unable to engage thumb L: 29#; 20th: R unable; 40th:  unable, fingers slipping   Time 12    Period Weeks    Status On-going    Target Date 01/25/22      OT LONG TERM GOAL #5   Title Pt. will improve right hand [facility] skills to be able to independently manipulate buttons, and zippers.    Baseline Eval: Pt. has difficulty managing buttons,a nd zippers. 10th visit:  improving; 20th: unable to engage R hand effectively, performs 1 handed with L    Time 12    Period Weeks    Status On-going    Target Date 01/25/22      OT LONG TERM GOAL #6   Title Pt. will independently write his name    Baseline Eval: Pt. is unable to hold a pen; 20th: pt can grip PenAgain with R hand and can make marks on paper with difficulty, but not yet writing name; 40th:  continues to demonstrate difficulty with grasp of pen   Time 12    Period Weeks    Status On-going    Target Date 01/25/22      OT LONG TERM GOAL #7   Title Pt. will improve FOTO score by 2 points to reflect funational improvement    Baseline Eval: FOTO score 46 with TR score 52; 20th: 45; 40th:    Time 12    Period Weeks    Status On-going    Target  Date 01/25/22              Plan -    Clinical Impression Statement Pt with decreased pain this date in back and reports having a good day upon waking.  Pt seen this date with focus on facilitation of thumb and finger combinations for grasp and release patterns with objects.  He demonstrates greater ability this date with modulation of graded pressure in the right hand with attempts to statically hold items and while reaching with grasp and release patterns.  He has difficulty with oppositional movements of the right thumb and would benefit from continued focus on this area.  Will continue to work towards neuro reed of right hand with emphasis on thumb movements to integrate into functional grasping and manipulation skills.    OT Occupational Profile and History Detailed Assessment- Review of Records and additional review of physical, cognitive, psychosocial history related to current functional performance    Occupational performance deficits (Please refer to evaluation for details): ADL's;IADL's;Education    Rehab Potential Good    Clinical Decision Making Several treatment options, min-mod task modification necessary    Comorbidities Affecting Occupational Performance: May have comorbidities impacting occupational performance    Modification or Assistance to Complete Evaluation  Min-Moderate modification of tasks or assist with assess necessary to complete eval    OT Frequency 2x / week    OT Duration 12 weeks    OT Treatment/Interventions Self-care/ADL training;DME and/or AE instruction;Therapeutic exercise;Ultrasound;Neuromuscular education;Therapeutic activities;Energy conservation;Moist Heat;Patient/family education;Splinting;Functional Mobility Training;Paraffin    Plan Neuro re-ed and therapeutic exercise    Consulted and Agree with Plan of Care Patient             Kelle Ruppert Oneita Jolly, OTR/L, CLT  Buffi Ew[place], OT 11/30/2021, 10:50 AM

## 2021-12-04 ENCOUNTER — Ambulatory Visit: Payer: Medicare Other

## 2021-12-04 ENCOUNTER — Encounter: Payer: Medicare HMO | Admitting: Speech Pathology

## 2021-12-04 DIAGNOSIS — M6281 Muscle weakness (generalized): Secondary | ICD-10-CM

## 2021-12-04 DIAGNOSIS — R2681 Unsteadiness on feet: Secondary | ICD-10-CM

## 2021-12-04 DIAGNOSIS — M545 Low back pain, unspecified: Secondary | ICD-10-CM

## 2021-12-04 DIAGNOSIS — Z8673 Personal history of transient ischemic attack (TIA), and cerebral infarction without residual deficits: Secondary | ICD-10-CM

## 2021-12-04 DIAGNOSIS — R278 Other lack of coordination: Secondary | ICD-10-CM

## 2021-12-04 NOTE — Therapy (Signed)
[place] MAIN [facility] SERVICES [address] [place], [place], [address] Phone: [phone]   Fax:  [phone]  Patient Details  Name: David Reeves MRN: [id number] Date of Birth: [birth year] Referring Provider:  [name], PA-C  Encounter Date: 12/04/2021   OUTPATIENT PHYSICAL THERAPY TREATMENT NOTE/Physical Therapy Progress Note   Dates of reporting period  10/24/2021   to   12/04/2021      Patient Name: David Reeves MRN: [id number] DOB:03-01-1971, 51 y.o., male Today's Date: 12/04/2021  PCP: [name], PA-C REFERRING PROVIDER: [name], PA-C   PT End of Session - 12/04/21 1052     Visit Number 40    Number of Visits 87    Date for PT Re-Evaluation 02/05/22    Authorization Type [employer]    Progress Note Due on Visit 89    PT Start Time 1019    PT Stop Time 1100    PT Time Calculation (min) 41 min    Equipment Utilized During Treatment Gait belt    Activity Tolerance Patient tolerated treatment well;No increased pain    Behavior During Therapy [facility] for tasks assessed/performed                Past Medical History:  Diagnosis Date   Back pain 04/22/2012   Bone spur    Bulging disc 04/22/2012   Degenerative disc disease    Osteoarthritis    Panic anxiety syndrome    Stroke ([place]) 10/2020   Taking multiple medications for chronic disease    Past Surgical History:  Procedure Laterality Date   CYSTECTOMY     head   HERNIA REPAIR Left 2006,2014   [facility]   TOE SURGERY Right 2007   Patient Active Problem List   Diagnosis Date Noted   Alterations of sensations following cerebrovascular accident 03/08/2021   Aphasia as late effect of cerebrovascular accident (CVA) 03/08/2021   Muscle spasm 02/22/2021   Acute non-recurrent frontal sinusitis 02/22/2021   Chronic pain syndrome 01/23/2021   Primary hypertension 01/23/2021   Nerve pain 01/23/2021   Erectile dysfunction due to  diseases classified elsewhere 01/23/2021   Cognitive dysfunction 01/23/2021   Scrotal edema 01/23/2021   Elevated LDL cholesterol level 01/23/2021   History of stroke with residual deficit 01/23/2021   Primary insomnia 01/23/2021   Mouth pain 01/23/2021   Laceration of right hand without foreign body    Closed compression fracture of L1 vertebra ([place]) 05/24/2016   Lumbar stenosis with neurogenic claudication 11/01/2015   Chronic 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 11/08/2014   Hx of hemorrhoids 11/08/2014   AAA (abdominal aortic aneurysm) ([place]) 09/22/2014   Chronic pain associated with significant psychosocial dysfunction 09/22/2014   Panic disorder 09/22/2014   AB (asthmatic bronchitis) 08/17/2014   Anxiety 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 08/17/2014   Backache 08/17/2014   Lumbosacral spondylosis without myelopathy 08/17/2014   Disorder of male genital organs 08/17/2014   Brash 08/17/2014   Low back pain 08/17/2014   Tendon nodule 08/17/2014   Episodic paroxysmal anxiety disorder 08/17/2014   Hernia, inguinal, right 08/17/2014   Fast heart beat 08/17/2014   Illness 08/17/2014   Inguinal hernia 10/14/2012    REFERRING DIAG: History of stroke with residual deficit, chronic 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, Lumbosacral spondylosis without myelopathy THERAPY DIAG:  Chronic bilateral low back pain without sciatica  Muscle weakness (generalized)  Unsteadiness on feet  Rationale for Evaluation and Treatment Rehabilitation  PERTINENT  HISTORY: Pt. is a 51 y.o. who was diagnosed with a CVA on July 21st, 2022. Pt. completed several weeks of inpatient rehab at [facility]. After returning to home. He was discharged in late August/early September and recieved home health PT. Pt. sustained a fall in december of 2022, and was admitted to the hospital with COVID-19, and Chronic back pain. He reports chronic back pain syndrome since 2012. Since the most recent  discharge, Pt. has been residing with the ex-wife until the Pt. is ready to return to independent living. He did recieve 2 weeks of home health after discharge in December 2022. He is now being referred to outpatient PT to address weakness from stroke and improve fine motor movement. He reports rarely getting numbness/tingling in RLE. PMHx includes: Bilateral LBP with right sided sciatica, Lumbosacral spondylosis myelopathy, closed compression Fx of L1. Has chronic insomnia. osteoarthritis, Panic anxiety syndrome (taking medication) Pt. enjoys cooking, and riding motorcycles. Patient is going to [facility] spine center for Epidural spinal injections on 06/07/21;  PRECAUTIONS: Fall, Spinal Brace wears daily, approximately 25%, when exercising; semi-rigid lumbar brace  SUBJECTIVE:  Pain currently 2/10. No falls or stumbles. No other concerns reported. Has started using pain patch on low back.  PAIN:  Are you having pain? Yes: NPRS scale: 2/10 Pain location: bilat low back Pain description: dull ache; always constant,  Aggravating factors: worse with sitting,  Relieving factors: heat helps temporarily, pain pills/pain patch   TODAY'S TREATMENT 12/04/21  No heat used on this date as pt presents with pain med patch on low back   Reviewed goals. See goal section below for details  LTR 20x Hooklying Bridges 1x15 Hooklying Marching 1x10 each LE  SLR 15x each LE. Rates easy   7.5# donned:  SLR 8x 2 sets each LE  Marches 20x Alt LE   Hookyling adductor squeezes with p.ball 15x 3 sec x 2 sets. Rates medium Deadbug march with PPT, TrA activations 10x 5 sec holds  2 sets each LE   LTRs 10x   Open book (PT assists with R side) 6x each side   NMR: SLB  -L 15 seconds -R 8 seconds Stairs- still unsteady, requires close CGA and UE support to descend Tandem 30 sec each LE              FOTO completed previous session  Pain at baseline at end of session   PATIENT EDUCATION:  Education details:  exercise technique, body mechanics, goals Person educated: Patient Education method: Explanation, Demonstration, Tactile cues, and Verbal cues Education comprehension: verbalized understanding, returned demonstration, verbal cues required, tactile cues required, and needs further education   HOME EXERCISE PROGRAM:   No updates on this date pt to continue HEP as previously given     PT Short Term Goals -       PT SHORT TERM GOAL #1   Title Patient will be adherent to HEP at least 3x a week to improve functional strength and balance for better safety at home.    Baseline 4/4: doing them 1-2x a week; 08/20/2021=Patient reports performing his low back stretching and no questions currently HEP. 10/24/21: doing HEP 4x a week   Time 4    Period Weeks    Status Achieved    Target Date 08/21/21      PT SHORT TERM GOAL #2   Title Patient (< 74 years old) will complete five times sit to stand test in < 15 seconds indicating an increased LE strength and  improved balance.    Baseline 4/4: 17.85 sec with arms across chest; 08/20/2021= 17.6 sec , 7/5: 12.85 sec   Time 4    Period Weeks    Status Achieved;    Target Date 10/01/21              PT Long Term Goals -       PT LONG TERM GOAL #1   Title Patient will increase six minute walk test distance to >1000 for progression to community ambulator and improve gait ability    Baseline 4/4: 1115 feet , 7/5: 1080 feet;    Time 4    Period Weeks    Status Achieved    Target Date 08/21/21      PT LONG TERM GOAL #2   Title Patient will ascend/descend 8 stairs without rail assist independently without loss of balance to improve ability to get in/out of home.    Baseline 4/4: requires 1 rail assist; 08/20/2021- Patient demonstrated steps without railing using reciprocal steps today. 7/5: ascend/descend 6 steps reciprocally without rail, but required CGA for safety when descending; 7/25: Able to complete without UE support and good balance going up  steps, unsafe descending steps without UE support. Required min assist and UE support to prevent LOB descending.; 8/15: requires UUE support to descend and close CGA due to unsteadiness    Time 4    Period Weeks    Status On-going    Target Date 12/11/2021      PT LONG TERM GOAL #3   Title Patient will increase BLE gross strength to 4+/5 as to improve functional strength for independent gait, increased standing tolerance and increased ADL ability.    Baseline 4/4: not formally assessed; 08/20/2021= 4+/5 with Right hip flex/knee ext/flex; Right hip abd= 4/5, 7/5: 4+/5 hip    Time 12    Period Weeks    Status Achieved;    Target Date 11/12/21      PT LONG TERM GOAL #4   Title Patient will improve FOTO score to >50% to indicate improved functional mobility with less pain with ADLs.    Baseline 4/4: 35%; 08/20/2021= 42% 7/5: 47%; 7:25:recently addressed; 11/30/21: 35   Time 12    Period Weeks    Status Partially Met   Target Date 02/05/2022      PT LONG TERM GOAL #5   Title Patient will report a worst pain of 4/10 in low back over last week to indicate improved tolerance with ADLs.    Baseline 4/4: 7/10; 08/20/2021= 4/10 , 7/5: worst 7/10 in low back; 7/25: 6/10 ; 8/15: pt reports 6/10   Time 12    Period Weeks    Status  On-going   Target Date 02/05/2022      Additional Long Term Goals   Additional Long Term Goals Yes      PT LONG TERM GOAL #6   Title Pt will increase 10MWT by at least 0.13 m/s in order to demonstrate clinically significant improvement in community ambulation    Baseline 08/20/2021=0.94 m/s 7/5: 1.5 m/s   Time 12    Period Weeks    Status Achieved;    Target Date 11/12/21      PT LONG TERM GOAL #7   Title Patient will demonstrate improved static standing balance as seen by ability to single leg stance on right LE > 12 sec consistently for optimal balance on level and unlevel surfaces.    Baseline 08/20/2021= 4 sec SLS on  right , 7/5: 4-5 sec inconsistent; 7/25 able to  achieve 15 sec after multiple attempts of 4-5 sec holds; 8/15: LLE 15 sec, RLE 8 sec   Time 12    Period Weeks    Status Partially Met   Target Date 02/05/2022              Plan -     Clinical Impression Statement Goals reassessed for progress note. Pt more consistent with RLE SLB, achieving 8 sec on first attempt. He is able to ascend steps without UE support and with good postural control, but still unsafe with descending steps, requiring close CGA and UUE support on rail. Pt steadily able to increase resistance used with therex over past several vitis. Presents with only 2/10 pain today, but worst pain still 6/19. Patient's condition has the potential to improve in response to therapy. Maximum improvement is yet to be obtained. The anticipated improvement is attainable and reasonable in a generally predictable time.  The pt will benefit from further skilled PT to improve strength and balance, and decrease pain levels.   Personal Factors and Comorbidities Comorbidity 3+;Past/Current Experience;Time since onset of injury/illness/exacerbation    Comorbidities PMHx includes: Bilateral LBP with right sided sciatica, Lumbosacral spondylosis myelopathy, closed compression Fx of L1. Has chronic insomnia. osteoarthritis, Panic anxiety syndrome (taking medication)    Examination-Activity Limitations Bend;Carry;Locomotion Level;Sit;Squat;Stairs;Stand;Transfers    Examination-Participation Restrictions Cleaning;Community Activity;Driving;Laundry;Meal Prep;Occupation;Shop;Volunteer;Yard Work    Stability/Clinical Decision Making Stable/Uncomplicated    Rehab Potential Good    PT Frequency 2x / week    PT Duration 12 weeks    PT Treatment/Interventions Cryotherapy;Electrical Stimulation;Moist Heat;Traction;Gait training;Stair training;Functional mobility training;Therapeutic activities;Therapeutic exercise;Balance training;Neuromuscular re-education;Patient/family education;Manual techniques;Passive  range of motion;Dry needling;Energy conservation    PT Next Visit Plan  Progress core stabilization, Manual therapy for low back and LE ROM, instruct in balance HEP, continue plan       Consulted and Agree with Plan of Care Patient            2:04 PM, 12/04/21   Zollie Pee, PT 12/04/2021, 2:04 PM  [place] MAIN [facility] SERVICES [address] [place], [place], [address] Phone: [phone]   Fax:  [phone]

## 2021-12-04 NOTE — Therapy (Signed)
OUTPATIENT OCCUPATIONAL THERAPY TREATMENT NOTE  Patient Name: David Reeves MRN: [id number] DOB:04-20-1971, 51 y.o., male Today's Date: 12/04/2021  PCP: [name], PA-C REFERRING PROVIDER: [name], PA-C  END OF SESSION:      OT End of Session - 12/04/21 1729     Visit Number 48    Number of Visits 50    Date for OT Re-Evaluation 01/25/22    Authorization Type Progress reporting period starting 11/02/2021    OT Start Time 0919    OT Stop Time 1001    OT Time Calculation (min) 42 min    Activity Tolerance Patient tolerated treatment well    Behavior During Therapy [facility] for tasks assessed/performed              Past Medical History:  Diagnosis Date   Back pain 04/22/2012   Bone spur    Bulging disc 04/22/2012   Degenerative disc disease    Osteoarthritis    Panic anxiety syndrome    Stroke ([place]) 10/2020   Taking multiple medications for chronic disease    Past Surgical History:  Procedure Laterality Date   CYSTECTOMY     head   HERNIA REPAIR Left 2006,2014   [facility]   TOE SURGERY Right 2007   Patient Active Problem List   Diagnosis Date Noted   Alterations of sensations following cerebrovascular accident 03/08/2021   Aphasia as late effect of cerebrovascular accident (CVA) 03/08/2021   Muscle spasm 02/22/2021   Acute non-recurrent frontal sinusitis 02/22/2021   Chronic pain syndrome 01/23/2021   Primary hypertension 01/23/2021   Nerve pain 01/23/2021   Erectile dysfunction due to diseases classified elsewhere 01/23/2021   Cognitive dysfunction 01/23/2021   Scrotal edema 01/23/2021   Elevated LDL cholesterol level 01/23/2021   History of stroke with residual deficit 01/23/2021   Primary insomnia 01/23/2021   Mouth pain 01/23/2021   Laceration of right hand without foreign body    Closed compression fracture of L1 vertebra ([place]) 05/24/2016   Lumbar stenosis with neurogenic claudication 11/01/2015   Chronic bilateral low back pain with  right-sided sciatica 11/08/2014   Hx of hemorrhoids 11/08/2014   AAA (abdominal aortic aneurysm) ([place]) 09/22/2014   Chronic pain associated with significant psychosocial dysfunction 09/22/2014   Panic disorder 09/22/2014   AB (asthmatic bronchitis) 08/17/2014   Anxiety 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 08/17/2014   Backache 08/17/2014   Lumbosacral spondylosis without myelopathy 08/17/2014   Disorder of male genital organs 08/17/2014   Brash 08/17/2014   Low back pain 08/17/2014   Tendon nodule 08/17/2014   Episodic paroxysmal anxiety disorder 08/17/2014   Hernia, inguinal, right 08/17/2014   Fast heart beat 08/17/2014   Illness 08/17/2014   Inguinal hernia 10/14/2012    ONSET DATE: 11/09/2020  REFERRING DIAG: CVA  THERAPY DIAG:  Muscle weakness (generalized)  Other lack of coordination  H/O ischemic left MCA stroke  Rationale for Evaluation and Treatment Rehabilitation  PERTINENT HISTORY: Pt. is a 51 y.o. who was diagnosed with a CVA on July 21st, 2022. Pt. completed several weeks of  inpatient rehab at [facility]. After returning to home, Pt. sustained a fall in december of 2022, and was admitted to the hospital with COVI-19, and back pain from the fall. Since the most recent discharge, Pt. has been residing with the ex-wife until the Pt. is ready to return to independent living. Pt. PMHx includes: Bilateral LBP with right sided sciatica, Lumbosacral spondylosis myelopathy, closed compression Fx of L1. Pt. enjoys cooking, and  riding motorcycles.   PRECAUTIONS: Fall  SUBJECTIVE: Pt reports he will go to his pain doctor tomorrow, but that he's feeling great today with 2/10 pain in the back. PAIN:  Are you having pain? Yes: NPRS scale: 2/10 Pain location: low back Pain description: aching Aggravating factors: prolonged sitting or lying down Relieving factors: rest, heat, walking   OBJECTIVE:  TODAY'S TREATMENT:    Therapeutic Exercise:  Performed passive wrist and  digit extension and WB to R hand on tabletop intermittently between exercises noted belowel for tone reduction in R wrist and hand.  Performed single digit taps x2 sets 10 reps each, OT providing hand over hand assist to keep alternate digits in place and intermittent assist to initiate tapping at thumb and 5th digit.  Performed digit abd/add with active assist to help limit PIP flexion during completion, x2 sets 10 reps each.  Performed thumb stretching with slides on tennis ball to promote increased palmar abd.  Neuromuscular Reeducation: Pt seen for facilitation of grasp and release patterns with use of ball pegs.  Pt able to remove 6 pegs from board with increased time and min A for 2 pegs. Practiced grasp/release of tennis ball in R hand and performing combination movements maintaining grip of tennis ball during shoulder, elbow, forearm, and wrist ROM.  Pt struggles to maintain grip of tennis ball during active wrist flexion just past neutral.  Performed active assisted wrist flexion/ext with hand over hand assist to maintain grip of tennis ball.    PATIENT EDUCATION: Education details: HEP progression, grasp/release activities  Person educated: Patient Education method: Explanation Education comprehension: verbalized and demonstrated understanding.      OT Short Term Goals -      OT SHORT TERM GOAL #1   Title Pt. wil demonstrtae independence with HEPs    Baseline Eval: Pt. currently does not have one, 10th visit:  changes in HEP ongoing; 20th: instructed in putty exercises, changes ongoing; 40th: continual updates to HEP as pt progresses   Time 6    Period Weeks    Status On-going    Target Date 11/20/21             OT Long Term Goals -       OT LONG TERM GOAL #1   Title Pt. will improve RUE strength by 2 mm grades to assist with ADLs, and IADLs,    Baseline Eval: Right shoulder flexion, abduction 3/5, elbow flexion, extension wrist extension 3+/5, 10th visit: improving with  RUE strength by not yet met goal; 20th visit: R shd flex/abd 4-, elbow flex/ext 4+, wrist flex 4-, wrist ext 4+; 40:  R shd flex/abd 4,  elbow flex/ext 4+, wrist flex 4, wrist ext 4+   Time 12    Period Weeks    Status On-going    Target Date 01/25/22      OT LONG TERM GOAL #2   Title Pt. will improve right grip by 10 lbs to prepare for firmly holding objects for IADLs.    Baseline Eval: R: 38#, L: 124# (In 3rd dynamometer slot); R 35# in 4th slot, 28# in 3rd slot, 40th: 47 in 4th slot   Time 12    Period Weeks    Status On-going    Target Date 01/25/22      OT LONG TERM GOAL #3   Title Pt. will improve right lateral pinch strength by 5# to assist with cutting food    Baseline Eval: R: 17, L: 25; 20th:  R: 18; 40th: 15# fingers slipping on meter   Time 12    Period Weeks    Status On-going    Target Date 01/25/22      OT LONG TERM GOAL #4   Title Pt. will improve Right 3pt pinch by 2# to be able to hold/open items for cooking    Baseline Eval: R: Pt. unable to engage thumb L: 29#; 20th: R unable; 40th:  unable, fingers slipping   Time 12    Period Weeks    Status On-going    Target Date 01/25/22      OT LONG TERM GOAL #5   Title Pt. will improve right hand [facility] skills to be able to independently manipulate buttons, and zippers.    Baseline Eval: Pt. has difficulty managing buttons,a nd zippers. 10th visit:  improving; 20th: unable to engage R hand effectively, performs 1 handed with L    Time 12    Period Weeks    Status On-going    Target Date 01/25/22      OT LONG TERM GOAL #6   Title Pt. will independently write his name    Baseline Eval: Pt. is unable to hold a pen; 20th: pt can grip PenAgain with R hand and can make marks on paper with difficulty, but not yet writing name; 40th:  continues to demonstrate difficulty with grasp of pen   Time 12    Period Weeks    Status On-going    Target Date 01/25/22      OT LONG TERM GOAL #7   Title Pt. will improve FOTO score by 2  points to reflect funational improvement    Baseline Eval: FOTO score 46 with TR score 52; 20th: 45; 40th:    Time 12    Period Weeks    Status On-going    Target Date 01/25/22              Plan -    Clinical Impression Statement Pt continues to report decreased pain this date in back and he has a follow up with the "pain doctor" tomorrow.  Pt seen this date with continued focus on facilitation of thumb and finger combinations for grasp and release patterns with small objects.  Pt continues to have difficulty with oppositional movements of the right thumb and will benefit from continued focus on this area.  Will continue to work towards neuro reed of right hand with emphasis on thumb movements to integrate into functional grasping and manipulation skills.    OT Occupational Profile and History Detailed Assessment- Review of Records and additional review of physical, cognitive, psychosocial history related to current functional performance    Occupational performance deficits (Please refer to evaluation for details): ADL's;IADL's;Education    Rehab Potential Good    Clinical Decision Making Several treatment options, min-mod task modification necessary    Comorbidities Affecting Occupational Performance: May have comorbidities impacting occupational performance    Modification or Assistance to Complete Evaluation  Min-Moderate modification of tasks or assist with assess necessary to complete eval    OT Frequency 2x / week    OT Duration 12 weeks    OT Treatment/Interventions Self-care/ADL training;DME and/or AE instruction;Therapeutic exercise;Ultrasound;Neuromuscular education;Therapeutic activities;Energy conservation;Moist Heat;Patient/family education;Splinting;Functional Mobility Training;Paraffin    Plan Neuro re-ed and therapeutic exercise    Consulted and Agree with Plan of Care Patient             Leta Speller, MS, OTR/L   Darleene Cleaver, OT 12/04/2021,  5:31 PM

## 2021-12-06 NOTE — Progress Notes (Deleted)
Established patient visit   Patient: David Reeves   DOB: 07-20-1970   51 y.o. Male  MRN: [id number] Visit Date: 12/07/2021  Today's healthcare provider: Alfredia Ferguson, PA-C   No chief complaint on file.  Subjective    HPI  Follow up for chronic pain  The patient was last seen for this 4 months ago. Changes made at last visit include no changes. Continue valium 5mg  BID. Switch Mobic to Celebrex 200mg  BID. Advised to contact [facility] pain clinic.   He reports {excellent/good/fair/poor:19665} compliance with treatment. He feels that condition is {improved/worse/unchanged:3041574}. He {is/is not:21021397} having side effects. ***  ----------------------------------------------------------------------------------------- Hypertension, follow-up  BP Readings from Last 3 Encounters:  08/07/21 122/79  05/03/21 115/69  04/05/21 (!) 154/97   Wt Readings from Last 3 Encounters:  08/07/21 213 lb (96.6 kg)  05/03/21 220 lb 14.4 oz (100.2 kg)  03/07/21 226 lb 14.4 oz (102.9 kg)     He was last seen for hypertension 4 months ago.  BP at that visit was 122/79. Management since that visit includes no changes.  He reports {excellent/good/fair/poor:19665} compliance with treatment. He {is/is not:9024} having side effects. {document side effects if present:1} He is following a {diet:21022986} diet. He {is/is not:9024} exercising. He {does/does not:200015} smoke.  Use of agents associated with hypertension: {bp agents assoc with hypertension:511::"none"}.   Outside blood pressures are {***enter patient reported home BP readings, or 'not being checked':1}. Symptoms: {Yes/No:20286} chest pain {Yes/No:20286} chest pressure  {Yes/No:20286} palpitations {Yes/No:20286} syncope  {Yes/No:20286} dyspnea {Yes/No:20286} orthopnea  {Yes/No:20286} paroxysmal nocturnal dyspnea {Yes/No:20286} lower extremity edema   Pertinent labs Lab Results  Component Value Date   CHOL 246 (H)  12/14/2020   HDL 43 12/14/2020   LDLCALC 179 (H) 12/14/2020   TRIG 129 12/14/2020   CHOLHDL 5.7 (H) 12/14/2020   Lab Results  Component Value Date   NA 137 04/05/2021   K 3.7 04/05/2021   CREATININE 0.68 04/05/2021   GFRNONAA >60 04/05/2021   GLUCOSE 101 (H) 04/05/2021   TSH 1.130 02/15/2020     The ASCVD Risk score (Arnett DK, et al., 2019) failed to calculate for the following reasons:   The patient has a prior MI or stroke diagnosis  ---------------------------------------------------------------------------------------------------   Medications: Outpatient Medications Prior to Visit  Medication Sig   acetaminophen (TYLENOL) 500 MG tablet Take 1,000 mg by mouth every 6 (six) hours as needed for mild pain.   amLODipine (NORVASC) 5 MG tablet Take 1 tablet (5 mg total) by mouth daily.   aspirin EC 81 MG tablet Take 81 mg by mouth daily. Swallow whole.   atorvastatin (LIPITOR) 80 MG tablet Take 1 tablet (80 mg total) by mouth daily.   celecoxib (CELEBREX) 200 MG capsule TAKE 1 CAPSULE BY MOUTH TWICE A DAY   Cholecalciferol (VITAMIN D3) 2000 units TABS Take by mouth.   diazepam (VALIUM) 5 MG tablet TAKE 1 TABLET (5 MG TOTAL) BY MOUTH EVERY 12 HOURS AS NEEDED FOR MUSCLE SPASM   donepezil (ARICEPT) 10 MG tablet Take 1 tablet (10 mg total) by mouth at bedtime.   gabapentin (NEURONTIN) 300 MG capsule Take 300 mg by mouth 3 (three) times daily.   Multiple Vitamin (MULTIVITAMIN) tablet Take 1 tablet by mouth daily.   prazosin (MINIPRESS) 1 MG capsule TAKE 2 CAPSULES (2 MG TOTAL) BY MOUTH AT BEDTIME. DO NOT TAKE WITH AMBIEN.   tiZANidine (ZANAFLEX) 4 MG tablet Take 1 tablet (4 mg total) by mouth in the morning, at noon,  in the evening, and at bedtime.   zolpidem (AMBIEN) 10 MG tablet Take 1 tablet (10 mg total) by mouth at bedtime.   No facility-administered medications prior to visit.    Review of Systems  {Labs  Heme  Chem  Endocrine  Serology  Results Review  (optional):23779}   Objective    There were no vitals taken for this visit. {Show previous vital signs (optional):23777}  Physical Exam  ***  No results found for any visits on 12/07/21.  Assessment & Plan     ***  No follow-ups on file.      {provider attestation***:1}   Alfredia Ferguson, PA-C  [facility] [phone] (phone) [phone] (fax)  [facility]

## 2021-12-07 ENCOUNTER — Encounter: Payer: Medicare HMO | Admitting: Speech Pathology

## 2021-12-07 ENCOUNTER — Ambulatory Visit: Payer: Medicare Other | Admitting: Physician Assistant

## 2021-12-07 ENCOUNTER — Ambulatory Visit: Payer: Medicare Other

## 2021-12-07 DIAGNOSIS — R278 Other lack of coordination: Secondary | ICD-10-CM

## 2021-12-07 DIAGNOSIS — M6281 Muscle weakness (generalized): Secondary | ICD-10-CM

## 2021-12-07 DIAGNOSIS — Z8673 Personal history of transient ischemic attack (TIA), and cerebral infarction without residual deficits: Secondary | ICD-10-CM

## 2021-12-07 DIAGNOSIS — M545 Low back pain, unspecified: Secondary | ICD-10-CM

## 2021-12-07 NOTE — Therapy (Signed)
[facility] [address] [place], [place], [address] Phone: [phone]   Fax:  [phone]  Patient Details  Name: David Reeves MRN: [id number] Date of Birth: 10-Sep-1970 Referring Provider:  [name], PA-C  Encounter Date: 12/07/2021   OUTPATIENT PHYSICAL THERAPY TREATMENT NOTE      Patient Name: David Reeves MRN: [id number] DOB:Aug 05, 1970, 51 y.o., male Today's Date: 12/07/2021  PCP: [name], PA-C REFERRING PROVIDER: [name], PA-C   PT End of Session - 12/07/21 1043     Visit Number 41    Number of Visits 67    Date for PT Re-Evaluation 02/05/22    Authorization Type [employer]    Progress Note Due on Visit 55    PT Start Time 1018    PT Stop Time 1100    PT Time Calculation (min) 42 min    Equipment Utilized During Treatment Gait belt    Activity Tolerance Patient tolerated treatment well;No increased pain    Behavior During Therapy [facility] for tasks assessed/performed                 Past Medical History:  Diagnosis Date   Back pain 04/22/2012   Bone spur    Bulging disc 04/22/2012   Degenerative disc disease    Osteoarthritis    Panic anxiety syndrome    Stroke ([place]) 10/2020   Taking multiple medications for chronic disease    Past Surgical History:  Procedure Laterality Date   CYSTECTOMY     head   HERNIA REPAIR Left 2006,2014   [facility]   TOE SURGERY Right 2007   Patient Active Problem List   Diagnosis Date Noted   Alterations of sensations following cerebrovascular accident 03/08/2021   Aphasia as late effect of cerebrovascular accident (CVA) 03/08/2021   Muscle spasm 02/22/2021   Acute non-recurrent frontal sinusitis 02/22/2021   Chronic pain syndrome 01/23/2021   Primary hypertension 01/23/2021   Nerve pain 01/23/2021   Erectile dysfunction due to diseases classified elsewhere 01/23/2021   Cognitive dysfunction 01/23/2021   Scrotal edema  01/23/2021   Elevated LDL cholesterol level 01/23/2021   History of stroke with residual deficit 01/23/2021   Primary insomnia 01/23/2021   Mouth pain 01/23/2021   Laceration of right hand without foreign body    Closed compression fracture of L1 vertebra ([place]) 05/24/2016   Lumbar stenosis with neurogenic claudication 11/01/2015   Chronic 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 11/08/2014   Hx of hemorrhoids 11/08/2014   AAA (abdominal aortic aneurysm) ([place]) 09/22/2014   Chronic pain associated with significant psychosocial dysfunction 09/22/2014   Panic disorder 09/22/2014   AB (asthmatic bronchitis) 08/17/2014   Anxiety 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 08/17/2014   Backache 08/17/2014   Lumbosacral spondylosis without myelopathy 08/17/2014   Disorder of male genital organs 08/17/2014   Brash 08/17/2014   Low back pain 08/17/2014   Tendon nodule 08/17/2014   Episodic paroxysmal anxiety disorder 08/17/2014   Hernia, inguinal, right 08/17/2014   Fast heart beat 08/17/2014   Illness 08/17/2014   Inguinal hernia 10/14/2012    REFERRING DIAG: History of stroke with residual deficit, chronic 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, Lumbosacral spondylosis without myelopathy THERAPY DIAG:  Chronic bilateral low back pain without sciatica  Muscle weakness (generalized)  Rationale for Evaluation and Treatment Rehabilitation  PERTINENT HISTORY: Pt. is a 51 y.o. who was diagnosed with a CVA on July 21st, 2022. Pt. completed several weeks  of inpatient rehab at [facility]. After returning to home. He was discharged in late August/early September and recieved home health PT. Pt. sustained a fall in december of 2022, and was admitted to the hospital with COVID-19, and Chronic back pain. He reports chronic back pain syndrome since 2012. Since the most recent discharge, Pt. has been residing with the ex-wife until the Pt. is ready to return to independent living. He did  recieve 2 weeks of home health after discharge in December 2022. He is now being referred to outpatient PT to address weakness from stroke and improve fine motor movement. He reports rarely getting numbness/tingling in RLE. PMHx includes: Bilateral LBP with right sided sciatica, Lumbosacral spondylosis myelopathy, closed compression Fx of L1. Has chronic insomnia. osteoarthritis, Panic anxiety syndrome (taking medication) Pt. enjoys cooking, and riding motorcycles. Patient is going to [facility] spine center for Epidural spinal injections on 06/07/21;  PRECAUTIONS: Fall, Spinal Brace wears daily, approximately 25%, when exercising; semi-rigid lumbar brace  SUBJECTIVE:  Pain currently 2/10. No falls or stumbles. Recently had trigger point injections.  Reports injections have been helpful. Does not currently have on pain patch.   PAIN:  Are you having pain? Yes: NPRS scale: 2/10 Pain location: bilat low back Pain description: dull ache; always constant,  Aggravating factors: worse with sitting,  Relieving factors: heat helps temporarily, pain pills/pain patch   TODAY'S TREATMENT 12/07/21  Heat donned to low back while pt performs the following. Skin check prior to placement and following removal of hot pack. Skin WNL.  TherEx- LTR 20x Green stability balls hamstring curls 20x Green stability ball glute 2x10. Rates medium Single leg glute bridge 10x each LE   7.5# donned:  SLR 2x10 sets each LE.   Marches 2x20 Alt LE      LTRs 10x each side   Side-lying hip abduction 1x10, 1x5  Hook-lying adductor squeezes with p.ball 2x15x 3 sec. Pt reports somewhat challenging still, but getting easier.   TherAct Instructed pt through rolling technique to decrease pain with bed mobility: reaching with UE to turn at same time as bringing BLE to ipsilateral side.    PATIENT EDUCATION:  Education details: exercise technique, body mechanics Person educated: Patient Education method: Explanation,  Demonstration, Tactile cues, and Verbal cues Education comprehension: verbalized understanding, returned demonstration, verbal cues required, tactile cues required, and needs further education   HOME EXERCISE PROGRAM:   No updates on this date pt to continue HEP as previously given     PT Short Term Goals -       PT SHORT TERM GOAL #1   Title Patient will be adherent to HEP at least 3x a week to improve functional strength and balance for better safety at home.    Baseline 4/4: doing them 1-2x a week; 08/20/2021=Patient reports performing his low back stretching and no questions currently HEP. 10/24/21: doing HEP 4x a week   Time 4    Period Weeks    Status Achieved    Target Date 08/21/21      PT SHORT TERM GOAL #2   Title Patient (< 25 years old) will complete five times sit to stand test in < 15 seconds indicating an increased LE strength and improved balance.    Baseline 4/4: 17.85 sec with arms across chest; 08/20/2021= 17.6 sec , 7/5: 12.85 sec   Time 4    Period Weeks    Status Achieved;    Target Date 10/01/21  PT Long Term Goals -       PT LONG TERM GOAL #1   Title Patient will increase six minute walk test distance to >1000 for progression to community ambulator and improve gait ability    Baseline 4/4: 1115 feet , 7/5: 1080 feet;    Time 4    Period Weeks    Status Achieved    Target Date 08/21/21      PT LONG TERM GOAL #2   Title Patient will ascend/descend 8 stairs without rail assist independently without loss of balance to improve ability to get in/out of home.    Baseline 4/4: requires 1 rail assist; 08/20/2021- Patient demonstrated steps without railing using reciprocal steps today. 7/5: ascend/descend 6 steps reciprocally without rail, but required CGA for safety when descending; 7/25: Able to complete without UE support and good balance going up steps, unsafe descending steps without UE support. Required min assist and UE support to prevent LOB  descending.; 8/15: requires UUE support to descend and close CGA due to unsteadiness    Time 4    Period Weeks    Status On-going    Target Date 12/11/2021      PT LONG TERM GOAL #3   Title Patient will increase BLE gross strength to 4+/5 as to improve functional strength for independent gait, increased standing tolerance and increased ADL ability.    Baseline 4/4: not formally assessed; 08/20/2021= 4+/5 with Right hip flex/knee ext/flex; Right hip abd= 4/5, 7/5: 4+/5 hip    Time 12    Period Weeks    Status Achieved;    Target Date 11/12/21      PT LONG TERM GOAL #4   Title Patient will improve FOTO score to >50% to indicate improved functional mobility with less pain with ADLs.    Baseline 4/4: 35%; 08/20/2021= 42% 7/5: 47%; 7:25:recently addressed; 11/30/21: 35   Time 12    Period Weeks    Status Partially Met   Target Date 02/05/2022      PT LONG TERM GOAL #5   Title Patient will report a worst pain of 4/10 in low back over last week to indicate improved tolerance with ADLs.    Baseline 4/4: 7/10; 08/20/2021= 4/10 , 7/5: worst 7/10 in low back; 7/25: 6/10 ; 8/15: pt reports 6/10   Time 12    Period Weeks    Status  On-going   Target Date 02/05/2022      Additional Long Term Goals   Additional Long Term Goals Yes      PT LONG TERM GOAL #6   Title Pt will increase 10MWT by at least 0.13 m/s in order to demonstrate clinically significant improvement in community ambulation    Baseline 08/20/2021=0.94 m/s 7/5: 1.5 m/s   Time 12    Period Weeks    Status Achieved;    Target Date 11/12/21      PT LONG TERM GOAL #7   Title Patient will demonstrate improved static standing balance as seen by ability to single leg stance on right LE > 12 sec consistently for optimal balance on level and unlevel surfaces.    Baseline 08/20/2021= 4 sec SLS on right , 7/5: 4-5 sec inconsistent; 7/25 able to achieve 15 sec after multiple attempts of 4-5 sec holds; 8/15: LLE 15 sec, RLE 8 sec   Time 12     Period Weeks    Status Partially Met   Target Date 02/05/2022  Plan -     Clinical Impression Statement Pt able to perform therex with increased reps with no increase in pain, indicating improved LE endurance and tolerance for activity. PT did briefly instruct pt in rolling technique to decrease LBP with bed mobility. Will provide further instruction in future sessions. The pt will benefit from further skilled PT to improve strength and balance, and decrease pain levels.   Personal Factors and Comorbidities Comorbidity 3+;Past/Current Experience;Time since onset of injury/illness/exacerbation    Comorbidities PMHx includes: Bilateral LBP with right sided sciatica, Lumbosacral spondylosis myelopathy, closed compression Fx of L1. Has chronic insomnia. osteoarthritis, Panic anxiety syndrome (taking medication)    Examination-Activity Limitations Bend;Carry;Locomotion Level;Sit;Squat;Stairs;Stand;Transfers    Examination-Participation Restrictions Cleaning;Community Activity;Driving;Laundry;Meal Prep;Occupation;Shop;Volunteer;Yard Work    Stability/Clinical Decision Making Stable/Uncomplicated    Rehab Potential Good    PT Frequency 2x / week    PT Duration 12 weeks    PT Treatment/Interventions Cryotherapy;Electrical Stimulation;Moist Heat;Traction;Gait training;Stair training;Functional mobility training;Therapeutic activities;Therapeutic exercise;Balance training;Neuromuscular re-education;Patient/family education;Manual techniques;Passive range of motion;Dry needling;Energy conservation    PT Next Visit Plan  Progress core stabilization, Manual therapy for low back and LE ROM, instruct in balance HEP, continue plan       Consulted and Agree with Plan of Care Patient            11:14 AM, 12/07/21   Zollie Pee, PT 12/07/2021, 11:14 AM  [facility] [address] [place], [place], [address] Phone: [phone]    Fax:  [phone]

## 2021-12-07 NOTE — Therapy (Signed)
OUTPATIENT OCCUPATIONAL THERAPY TREATMENT NOTE  Patient Name: David Reeves MRN: [id number] DOB:[birth year], 51 y.o., male Today's Date: 12/07/2021  PCP: [name], PA-C REFERRING PROVIDER: [name], PA-C  END OF SESSION:      OT End of Session - 12/07/21 0935     Visit Number 44    Number of Visits 19    Date for OT Re-Evaluation 01/25/22    Authorization Type Progress reporting period starting 11/02/2021    OT Start Time 0919    OT Stop Time 1002    OT Time Calculation (min) 43 min    Activity Tolerance Patient tolerated treatment well    Behavior During Therapy [facility] for tasks assessed/performed              Past Medical History:  Diagnosis Date   Back pain 04/22/2012   Bone spur    Bulging disc 04/22/2012   Degenerative disc disease    Osteoarthritis    Panic anxiety syndrome    Stroke ([place]) 10/2020   Taking multiple medications for chronic disease    Past Surgical History:  Procedure Laterality Date   CYSTECTOMY     head   HERNIA REPAIR Left 2006,2014   [facility]   TOE SURGERY Right 2007   Patient Active Problem List   Diagnosis Date Noted   Alterations of sensations following cerebrovascular accident 03/08/2021   Aphasia as late effect of cerebrovascular accident (CVA) 03/08/2021   Muscle spasm 02/22/2021   Acute non-recurrent frontal sinusitis 02/22/2021   Chronic pain syndrome 01/23/2021   Primary hypertension 01/23/2021   Nerve pain 01/23/2021   Erectile dysfunction due to diseases classified elsewhere 01/23/2021   Cognitive dysfunction 01/23/2021   Scrotal edema 01/23/2021   Elevated LDL cholesterol level 01/23/2021   History of stroke with residual deficit 01/23/2021   Primary insomnia 01/23/2021   Mouth pain 01/23/2021   Laceration of right hand without foreign body    Closed compression fracture of L1 vertebra ([place]) 05/24/2016   Lumbar stenosis with neurogenic claudication 11/01/2015   Chronic bilateral low back pain with  right-sided sciatica 11/08/2014   Hx of hemorrhoids 11/08/2014   AAA (abdominal aortic aneurysm) ([place]) 09/22/2014   Chronic pain associated with significant psychosocial dysfunction 09/22/2014   Panic disorder 09/22/2014   AB (asthmatic bronchitis) 08/17/2014   Anxiety 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 08/17/2014   Backache 08/17/2014   Lumbosacral spondylosis without myelopathy 08/17/2014   Disorder of male genital organs 08/17/2014   Brash 08/17/2014   Low back pain 08/17/2014   Tendon nodule 08/17/2014   Episodic paroxysmal anxiety disorder 08/17/2014   Hernia, inguinal, right 08/17/2014   Fast heart beat 08/17/2014   Illness 08/17/2014   Inguinal hernia 10/14/2012    ONSET DATE: 11/09/2020  REFERRING DIAG: CVA  THERAPY DIAG:  Muscle weakness (generalized)  Other lack of coordination  H/O ischemic left MCA stroke  Rationale for Evaluation and Treatment Rehabilitation  PERTINENT HISTORY: Pt. is a 51 y.o. who was diagnosed with a CVA on July 21st, 2022. Pt. completed several weeks of  inpatient rehab at [facility]. After returning to home, Pt. sustained a fall in december of 2022, and was admitted to the hospital with COVI-19, and back pain from the fall. Since the most recent discharge, Pt. has been residing with the ex-wife until the Pt. is ready to return to independent living. Pt. PMHx includes: Bilateral LBP with right sided sciatica, Lumbosacral spondylosis myelopathy, closed compression Fx of L1. Pt. enjoys cooking, and  riding motorcycles.   PRECAUTIONS: Fall  SUBJECTIVE: Pt reports he's feeling better than normal and doesn't know what to do with himself without the amount of pain he's been having.  Pt in great spirits today. PAIN:  Are you having pain? Yes: NPRS scale: 2/10 Pain location: low back Pain description: aching Aggravating factors: prolonged sitting or lying down Relieving factors: rest, heat, walking   OBJECTIVE:  TODAY'S TREATMENT:    Therapeutic  Exercise: Performed passive wrist and digit extension and WB to R hand on tabletop intermittently between exercises noted below for tone reduction in R wrist and hand. Performed digit abd/add with active assist to help limit PIP flexion during completion, x2 sets 10 reps each.  Performed lateral and 3 point pinching with 3 sets 10 reps on yellow resistive clip, OT providing assist to maintain position of digits on clip.  Performed active assisted R thumb radial and palmar abd x3 sets 10 reps each, cues for positioning to achieve desired movement.  Neuromuscular Reeducation: Pt seen for facilitation of grasp and release patterns with use of ball pegs.  Pt able to remove 10 pegs from board with increased time. Practiced grasp/release of tennis ball in R hand and performing combination movements maintaining grip of tennis ball during shoulder, elbow, forearm, and wrist ROM.  Pt struggles to maintain grip of tennis ball during active wrist flexion just past neutral.    PATIENT EDUCATION: Education details: HEP progression, grasp/release activities  Person educated: Patient Education method: Explanation Education comprehension: verbalized and demonstrated understanding.      OT Short Term Goals -      OT SHORT TERM GOAL #1   Title Pt. wil demonstrtae independence with HEPs    Baseline Eval: Pt. currently does not have one, 10th visit:  changes in HEP ongoing; 20th: instructed in putty exercises, changes ongoing; 40th: continual updates to HEP as pt progresses   Time 6    Period Weeks    Status On-going    Target Date 11/20/21             OT Long Term Goals -       OT LONG TERM GOAL #1   Title Pt. will improve RUE strength by 2 mm grades to assist with ADLs, and IADLs,    Baseline Eval: Right shoulder flexion, abduction 3/5, elbow flexion, extension wrist extension 3+/5, 10th visit: improving with RUE strength by not yet met goal; 20th visit: R shd flex/abd 4-, elbow flex/ext 4+, wrist  flex 4-, wrist ext 4+; 40:  R shd flex/abd 4,  elbow flex/ext 4+, wrist flex 4, wrist ext 4+   Time 12    Period Weeks    Status On-going    Target Date 01/25/22      OT LONG TERM GOAL #2   Title Pt. will improve right grip by 10 lbs to prepare for firmly holding objects for IADLs.    Baseline Eval: R: 38#, L: 124# (In 3rd dynamometer slot); R 35# in 4th slot, 28# in 3rd slot, 40th: 47 in 4th slot   Time 12    Period Weeks    Status On-going    Target Date 01/25/22      OT LONG TERM GOAL #3   Title Pt. will improve right lateral pinch strength by 5# to assist with cutting food    Baseline Eval: R: 17, L: 25; 20th: R: 18; 40th: 15# fingers slipping on meter   Time 12    Period Weeks  Status On-going    Target Date 01/25/22      OT LONG TERM GOAL #4   Title Pt. will improve Right 3pt pinch by 2# to be able to hold/open items for cooking    Baseline Eval: R: Pt. unable to engage thumb L: 29#; 20th: R unable; 40th:  unable, fingers slipping   Time 12    Period Weeks    Status On-going    Target Date 01/25/22      OT LONG TERM GOAL #5   Title Pt. will improve right hand [facility] skills to be able to independently manipulate buttons, and zippers.    Baseline Eval: Pt. has difficulty managing buttons,a nd zippers. 10th visit:  improving; 20th: unable to engage R hand effectively, performs 1 handed with L    Time 12    Period Weeks    Status On-going    Target Date 01/25/22      OT LONG TERM GOAL #6   Title Pt. will independently write his name    Baseline Eval: Pt. is unable to hold a pen; 20th: pt can grip PenAgain with R hand and can make marks on paper with difficulty, but not yet writing name; 40th:  continues to demonstrate difficulty with grasp of pen   Time 12    Period Weeks    Status On-going    Target Date 01/25/22      OT LONG TERM GOAL #7   Title Pt. will improve FOTO score by 2 points to reflect funational improvement    Baseline Eval: FOTO score 46 with TR score  52; 20th: 45; 40th:    Time 12    Period Weeks    Status On-going    Target Date 01/25/22             Plan -    Clinical Impression Statement Pt reports feeling better than normal this date with is back pain and has been very pleased with his pain doctor and results from his pain patches.  Pt seen this date with continued focus on facilitation of thumb and finger combinations for grasp and release patterns with small objects.  Pt increased # with successful trials of removing ball pegs from pegboard from 6 to 10 as compared to last tx session.  Pt is also improving with combination movements in the RUE, demonstrating ability to grasp tennis ball in R hand and perform active wrist flexion slightly past neutral without dropping ball.  Pt continues to have difficulty with oppositional movements of the right thumb and will benefit from continued focus on this area.  Pt practiced these oppositional movements this date with active assist from OT.  Pt will continue to work towards neuro reed of right hand with emphasis on thumb movements to integrate into functional grasping and manipulation skills.    OT Occupational Profile and History Detailed Assessment- Review of Records and additional review of physical, cognitive, psychosocial history related to current functional performance    Occupational performance deficits (Please refer to evaluation for details): ADL's;IADL's;Education    Rehab Potential Good    Clinical Decision Making Several treatment options, min-mod task modification necessary    Comorbidities Affecting Occupational Performance: May have comorbidities impacting occupational performance    Modification or Assistance to Complete Evaluation  Min-Moderate modification of tasks or assist with assess necessary to complete eval    OT Frequency 2x / week    OT Duration 12 weeks    OT Treatment/Interventions Self-care/ADL training;DME and/or  AE instruction;Therapeutic  exercise;Ultrasound;Neuromuscular education;Therapeutic activities;Energy conservation;Moist Heat;Patient/family education;Splinting;Functional Mobility Training;Paraffin    Plan Neuro re-ed and therapeutic exercise    Consulted and Agree with Plan of Care Patient             Leta Speller, MS, OTR/L   Darleene Cleaver, OT 12/07/2021, 11:13 AM

## 2021-12-11 ENCOUNTER — Other Ambulatory Visit: Payer: Self-pay | Admitting: Physician Assistant

## 2021-12-11 ENCOUNTER — Encounter: Payer: Medicare HMO | Admitting: Speech Pathology

## 2021-12-11 ENCOUNTER — Ambulatory Visit: Payer: Medicare Other

## 2021-12-11 DIAGNOSIS — Z8673 Personal history of transient ischemic attack (TIA), and cerebral infarction without residual deficits: Secondary | ICD-10-CM

## 2021-12-11 DIAGNOSIS — G8929 Other chronic pain: Secondary | ICD-10-CM

## 2021-12-11 DIAGNOSIS — G894 Chronic pain syndrome: Secondary | ICD-10-CM

## 2021-12-11 DIAGNOSIS — M6281 Muscle weakness (generalized): Secondary | ICD-10-CM | POA: Diagnosis not present

## 2021-12-11 DIAGNOSIS — R278 Other lack of coordination: Secondary | ICD-10-CM

## 2021-12-11 DIAGNOSIS — R2681 Unsteadiness on feet: Secondary | ICD-10-CM

## 2021-12-11 DIAGNOSIS — M545 Low back pain, unspecified: Secondary | ICD-10-CM

## 2021-12-11 NOTE — Therapy (Signed)
[place] MAIN [facility] SERVICES [address] [place], [place], [address] Phone: [phone]   Fax:  [phone]  Patient Details  Name: David Reeves MRN: [id number] Date of Birth: April 06, 1971 Referring Provider:  [name], PA-C  Encounter Date: 12/11/2021   OUTPATIENT PHYSICAL THERAPY TREATMENT NOTE      Patient Name: David Reeves MRN: [id number] DOB:1971/03/17, 51 y.o., male Today's Date: 12/11/2021  PCP: [name], PA-C REFERRING PROVIDER: [name], PA-C   PT End of Session - 12/11/21 1023     Visit Number 42    Number of Visits 32    Date for PT Re-Evaluation 02/05/22    Authorization Type [employer]    Progress Note Due on Visit 90    PT Start Time 1023    PT Stop Time 1059    PT Time Calculation (min) 36 min    Equipment Utilized During Treatment Gait belt    Activity Tolerance Patient tolerated treatment well;No increased pain    Behavior During Therapy [facility] for tasks assessed/performed                  Past Medical History:  Diagnosis Date   Back pain 04/22/2012   Bone spur    Bulging disc 04/22/2012   Degenerative disc disease    Osteoarthritis    Panic anxiety syndrome    Stroke ([place]) 10/2020   Taking multiple medications for chronic disease    Past Surgical History:  Procedure Laterality Date   CYSTECTOMY     head   HERNIA REPAIR Left 2006,2014   [facility]   TOE SURGERY Right 2007   Patient Active Problem List   Diagnosis Date Noted   Alterations of sensations following cerebrovascular accident 03/08/2021   Aphasia as late effect of cerebrovascular accident (CVA) 03/08/2021   Muscle spasm 02/22/2021   Acute non-recurrent frontal sinusitis 02/22/2021   Chronic pain syndrome 01/23/2021   Primary hypertension 01/23/2021   Nerve pain 01/23/2021   Erectile dysfunction due to diseases classified elsewhere 01/23/2021   Cognitive dysfunction 01/23/2021   Scrotal edema  01/23/2021   Elevated LDL cholesterol level 01/23/2021   History of stroke with residual deficit 01/23/2021   Primary insomnia 01/23/2021   Mouth pain 01/23/2021   Laceration of right hand without foreign body    Closed compression fracture of L1 vertebra ([place]) 05/24/2016   Lumbar stenosis with neurogenic claudication 11/01/2015   Chronic 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 11/08/2014   Hx of hemorrhoids 11/08/2014   AAA (abdominal aortic aneurysm) ([place]) 09/22/2014   Chronic pain associated with significant psychosocial dysfunction 09/22/2014   Panic disorder 09/22/2014   AB (asthmatic bronchitis) 08/17/2014   Anxiety 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 08/17/2014   Backache 08/17/2014   Lumbosacral spondylosis without myelopathy 08/17/2014   Disorder of male genital organs 08/17/2014   Brash 08/17/2014   Low back pain 08/17/2014   Tendon nodule 08/17/2014   Episodic paroxysmal anxiety disorder 08/17/2014   Hernia, inguinal, right 08/17/2014   Fast heart beat 08/17/2014   Illness 08/17/2014   Inguinal hernia 10/14/2012    REFERRING DIAG: History of stroke with residual deficit, chronic 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, Lumbosacral spondylosis without myelopathy THERAPY DIAG:  Chronic bilateral low back pain without sciatica  Muscle weakness (generalized)  Unsteadiness on feet  Rationale for Evaluation and Treatment Rehabilitation  PERTINENT HISTORY: Pt. is a 51 y.o. who was diagnosed with a CVA on July 21st,  2022. Pt. completed several weeks of inpatient rehab at [facility] After returning to home. He was discharged in late August/early September and recieved home health PT. Pt. sustained a fall in december of 2022, and was admitted to the hospital with COVID-19, and Chronic back pain. He reports chronic back pain syndrome since 2012. Since the most recent discharge, Pt. has been residing with the ex-wife until the Pt. is ready to return to  independent living. He did recieve 2 weeks of home health after discharge in December 2022. He is now being referred to outpatient PT to address weakness from stroke and improve fine motor movement. He reports rarely getting numbness/tingling in RLE. PMHx includes: Bilateral LBP with right sided sciatica, Lumbosacral spondylosis myelopathy, closed compression Fx of L1. Has chronic insomnia. osteoarthritis, Panic anxiety syndrome (taking medication) Pt. enjoys cooking, and riding motorcycles. Patient is going to [facility] for Epidural spinal injections on 06/07/21;  PRECAUTIONS: Fall, Spinal Brace wears daily, approximately 25%, when exercising; semi-rigid lumbar brace  SUBJECTIVE:  Pain currently 2/10. No falls or stumbles. He reports no other concerns or updates.   PAIN:  Are you having pain? Yes: NPRS scale: 2/10 Pain location: bilat low back Pain description: dull ache; always constant,  Aggravating factors: worse with sitting,  Relieving factors: heat helps temporarily, pain pills/pain patch   TODAY'S TREATMENT 12/11/21  Heat donned to low back while pt performs the following. Skin checked prior to placement and following removal of hot pack. Skin WNL.   TherEx-  On mat table: LTR 20x Hooklying adductor squeezes with p.ball 15x 3 sec holds. Cuing for increased RLE activation Green stability balls hamstring curls 20x Green stability ball glute bridge 2x10 Side-lying hip abduction 1x15 each LE  10# donned: SLR 2x10 sets each LE. Rates easy on LLE, medium-hard RLE  Marches 2x16 Alt LE. Very challenging in part due to coordination per pt report, feels he will get the hang of it  NMR: gait belt donned, CGA provided unless otherwise specified  In // bars: Tandem stance 30 sec each LE On half foam standing on center 2x30 sec On half foam performing heel raises 20x On half foam performing toe raises 20x   PATIENT EDUCATION:  Education details: exercise technique, body  mechanics Person educated: Patient Education method: Explanation, Demonstration, Tactile cues, and Verbal cues Education comprehension: verbalized understanding, returned demonstration, verbal cues required, tactile cues required, and needs further education   HOME EXERCISE PROGRAM:   No updates on this date pt to continue HEP as previously given     PT Short Term Goals -       PT SHORT TERM GOAL #1   Title Patient will be adherent to HEP at least 3x a week to improve functional strength and balance for better safety at home.    Baseline 4/4: doing them 1-2x a week; 08/20/2021=Patient reports performing his low back stretching and no questions currently HEP. 10/24/21: doing HEP 4x a week   Time 4    Period Weeks    Status Achieved    Target Date 08/21/21      PT SHORT TERM GOAL #2   Title Patient (< 38 years old) will complete five times sit to stand test in < 15 seconds indicating an increased LE strength and improved balance.    Baseline 4/4: 17.85 sec with arms across chest; 08/20/2021= 17.6 sec , 7/5: 12.85 sec   Time 4    Period Weeks    Status Achieved;  Target Date 10/01/21              PT Long Term Goals -       PT LONG TERM GOAL #1   Title Patient will increase six minute walk test distance to >1000 for progression to community ambulator and improve gait ability    Baseline 4/4: 1115 feet , 7/5: 1080 feet;    Time 4    Period Weeks    Status Achieved    Target Date 08/21/21      PT LONG TERM GOAL #2   Title Patient will ascend/descend 8 stairs without rail assist independently without loss of balance to improve ability to get in/out of home.    Baseline 4/4: requires 1 rail assist; 08/20/2021- Patient demonstrated steps without railing using reciprocal steps today. 7/5: ascend/descend 6 steps reciprocally without rail, but required CGA for safety when descending; 7/25: Able to complete without UE support and good balance going up steps, unsafe descending steps  without UE support. Required min assist and UE support to prevent LOB descending.; 8/15: requires UUE support to descend and close CGA due to unsteadiness    Time 4    Period Weeks    Status On-going    Target Date 12/11/2021      PT LONG TERM GOAL #3   Title Patient will increase BLE gross strength to 4+/5 as to improve functional strength for independent gait, increased standing tolerance and increased ADL ability.    Baseline 4/4: not formally assessed; 08/20/2021= 4+/5 with Right hip flex/knee ext/flex; Right hip abd= 4/5, 7/5: 4+/5 hip    Time 12    Period Weeks    Status Achieved;    Target Date 11/12/21      PT LONG TERM GOAL #4   Title Patient will improve FOTO score to >50% to indicate improved functional mobility with less pain with ADLs.    Baseline 4/4: 35%; 08/20/2021= 42% 7/5: 47%; 7:25:recently addressed; 11/30/21: 35   Time 12    Period Weeks    Status Partially Met   Target Date 02/05/2022      PT LONG TERM GOAL #5   Title Patient will report a worst pain of 4/10 in low back over last week to indicate improved tolerance with ADLs.    Baseline 4/4: 7/10; 08/20/2021= 4/10 , 7/5: worst 7/10 in low back; 7/25: 6/10 ; 8/15: pt reports 6/10   Time 12    Period Weeks    Status  On-going   Target Date 02/05/2022      Additional Long Term Goals   Additional Long Term Goals Yes      PT LONG TERM GOAL #6   Title Pt will increase 10MWT by at least 0.13 m/s in order to demonstrate clinically significant improvement in community ambulation    Baseline 08/20/2021=0.94 m/s 7/5: 1.5 m/s   Time 12    Period Weeks    Status Achieved;    Target Date 11/12/21      PT LONG TERM GOAL #7   Title Patient will demonstrate improved static standing balance as seen by ability to single leg stance on right LE > 12 sec consistently for optimal balance on level and unlevel surfaces.    Baseline 08/20/2021= 4 sec SLS on right , 7/5: 4-5 sec inconsistent; 7/25 able to achieve 15 sec after multiple  attempts of 4-5 sec holds; 8/15: LLE 15 sec, RLE 8 sec   Time 12    Period Weeks  Status Partially Met   Target Date 02/05/2022              Plan -     Clinical Impression Statement Session somewhat limited as pt needed to find misplaced phone at start of appointment. Pt continues to present with lower levels of LBP, indicating tolerance of progressive load. Pt further increases weights used with hooklying interventions. Future appts will trial seated and standing therex for tolerance. With balance he was most challenged with DF exercise on half-foam. The pt will benefit from further skilled PT to improve strength and balance, and decrease pain levels.   Personal Factors and Comorbidities Comorbidity 3+;Past/Current Experience;Time since onset of injury/illness/exacerbation    Comorbidities PMHx includes: Bilateral LBP with right sided sciatica, Lumbosacral spondylosis myelopathy, closed compression Fx of L1. Has chronic insomnia. osteoarthritis, Panic anxiety syndrome (taking medication)    Examination-Activity Limitations Bend;Carry;Locomotion Level;Sit;Squat;Stairs;Stand;Transfers    Examination-Participation Restrictions Cleaning;Community Activity;Driving;Laundry;Meal Prep;Occupation;Shop;Volunteer;Yard Work    Stability/Clinical Decision Making Stable/Uncomplicated    Rehab Potential Good    PT Frequency 2x / week    PT Duration 12 weeks    PT Treatment/Interventions Cryotherapy;Electrical Stimulation;Moist Heat;Traction;Gait training;Stair training;Functional mobility training;Therapeutic activities;Therapeutic exercise;Balance training;Neuromuscular re-education;Patient/family education;Manual techniques;Passive range of motion;Dry needling;Energy conservation    PT Next Visit Plan  Progress core stabilization, Manual therapy for low back and LE ROM, instruct in balance HEP, continue plan       Consulted and Agree with Plan of Care Patient            2:43 PM,  12/11/21   Zollie Pee, PT 12/11/2021, 2:43 PM  [place] [facility]IN [facility] SERVICES [address] [place], [place], [address] Phone: [phone]   Fax:  [phone]

## 2021-12-11 NOTE — Therapy (Signed)
OUTPATIENT OCCUPATIONAL THERAPY TREATMENT NOTE Reporting period beginning 11/02/21-12/11/21  Patient Name: David Reeves MRN: [id number] DOB:12-27-1970, 51 y.o., male Today's Date: 12/12/2021  PCP: [name], PA-C REFERRING PROVIDER: [name], PA-C  END OF SESSION:      OT End of Session - 12/12/21 1050     Visit Number 50    Number of Visits 32    Date for OT Re-Evaluation 01/25/22    Authorization Type Progress reporting period starting 11/02/2021    OT Start Time 0924    OT Stop Time 1012    OT Time Calculation (min) 48 min    Activity Tolerance Patient tolerated treatment well    Behavior During Therapy [facility] for tasks assessed/performed               Past Medical History:  Diagnosis Date   Back pain 04/22/2012   Bone spur    Bulging disc 04/22/2012   Degenerative disc disease    Osteoarthritis    Panic anxiety syndrome    Stroke ([place]) 10/2020   Taking multiple medications for chronic disease    Past Surgical History:  Procedure Laterality Date   CYSTECTOMY     head   HERNIA REPAIR Left 2006,2014   [facility]   TOE SURGERY Right 2007   Patient Active Problem List   Diagnosis Date Noted   Alterations of sensations following cerebrovascular accident 03/08/2021   Aphasia as late effect of cerebrovascular accident (CVA) 03/08/2021   Muscle spasm 02/22/2021   Acute non-recurrent frontal sinusitis 02/22/2021   Chronic pain syndrome 01/23/2021   Primary hypertension 01/23/2021   Nerve pain 01/23/2021   Erectile dysfunction due to diseases classified elsewhere 01/23/2021   Cognitive dysfunction 01/23/2021   Scrotal edema 01/23/2021   Elevated LDL cholesterol level 01/23/2021   History of stroke with residual deficit 01/23/2021   Primary insomnia 01/23/2021   Mouth pain 01/23/2021   Laceration of right hand without foreign body    Closed compression fracture of L1 vertebra ([place]) 05/24/2016   Lumbar stenosis with neurogenic claudication  11/01/2015   Chronic 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 11/08/2014   Hx of hemorrhoids 11/08/2014   AAA (abdominal aortic aneurysm) ([place]) 09/22/2014   Chronic pain associated with significant psychosocial dysfunction 09/22/2014   Panic disorder 09/22/2014   AB (asthmatic bronchitis) 08/17/2014   Anxiety 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 08/17/2014   Backache 08/17/2014   Lumbosacral spondylosis without myelopathy 08/17/2014   Disorder of male genital organs 08/17/2014   Brash 08/17/2014   Low back pain 08/17/2014   Tendon nodule 08/17/2014   Episodic paroxysmal anxiety disorder 08/17/2014   Hernia, inguinal, right 08/17/2014   Fast heart beat 08/17/2014   Illness 08/17/2014   Inguinal hernia 10/14/2012    ONSET DATE: 11/09/2020  REFERRING DIAG: CVA  THERAPY DIAG:  Muscle weakness (generalized)  Other lack of coordination  H/O ischemic left MCA stroke  Rationale for Evaluation and Treatment Rehabilitation  PERTINENT HISTORY: Pt. is a 51 y.o. who was diagnosed with a CVA on July 21st, 2022. Pt. completed several weeks of  inpatient rehab at [facility] After returning to home, Pt. sustained a fall in december of 2022, and was admitted to the hospital with COVI-19, and back pain from the fall. Since the most recent discharge, Pt. has been residing with the ex-wife until the Pt. is ready to return to independent living. Pt. PMHx includes: Bilateral LBP with right sided sciatica, Lumbosacral spondylosis myelopathy, closed compression Fx of  L1. Pt. enjoys cooking, and riding motorcycles.   PRECAUTIONS: Fall  SUBJECTIVE: Pt reports doing well today and back pain continues to be manageable. PAIN:  Are you having pain? Yes: NPRS scale: 2/10 Pain location: low back Pain description: aching Aggravating factors: prolonged sitting or lying down Relieving factors: rest, heat, walking   OBJECTIVE:  TODAY'S TREATMENT:    [facility] OT Assessment                Observation/Other Assessments    Focus on Therapeutic Outcomes (FOTO)   46         Coordination    Right 9 Hole Peg Test unable     Left 9 Hole Peg Test 30 sec          Hand Function    Right Hand Grip (lbs) 35 (3rd setting)    Right Hand Lateral Pinch 9 lbs   slipping    Right Hand 3 Point Pinch   unable to position thumb to produce 3 pt pinch    Left Hand Grip (lbs) 116     Left Hand Lateral Pinch 29 lbs     Left 3 point pinch 27 lbs   Therapeutic Exercise: Objective measures taken and goals updated.  Reviewed HEP and instructed on progression to incorporate picking up coins from a table by sliding to the edge, and working on sustained grasp of coin using a 2 point pinch.    PATIENT EDUCATION: Education details: HEP progression, grasp/release activities Person educated: Patient Education method: Explanation Education comprehension: verbalized and demonstrated understanding.      OT Short Term Goals -      OT SHORT TERM GOAL #1   Title Pt. wil demonstrtae independence with HEPs    Baseline Eval: Pt. currently does not have one, 10th visit:  changes in HEP ongoing; 20th: instructed in putty exercises, changes ongoing; 40th: continual updates to HEP as pt progresses; 50th: continual updates   Time 6    Period Weeks    Status On-going    Target Date 11/20/21             OT Long Term Goals -       OT LONG TERM GOAL #1   Title Pt. will improve RUE strength by 2 mm grades to assist with ADLs, and IADLs,    Baseline Eval: Right shoulder flexion, abduction 3/5, elbow flexion, extension wrist extension 3+/5, 10th visit: improving with RUE strength by not yet met goal; 20th visit: R shd flex/abd 4-, elbow flex/ext 4+, wrist flex 4-, wrist ext 4+; 40:  R shd flex/abd 4,  elbow flex/ext 4+, wrist flex 4, wrist ext 4+; 50th visit 8/22: R shoulder flex/abd 4/5, elbow flex/ext 5/5, wrist flex 4, wrist ext 4+   Time 12    Period Weeks    Status On-going    Target Date 01/25/22       OT LONG TERM GOAL #2   Title Pt. will improve right grip by 10 lbs to prepare for firmly holding objects for IADLs.    Baseline Eval: R: 38#, L: 124# (In 3rd dynamometer slot); R 35# in 4th slot, 28# in 3rd slot, 40th: 47 in 4th slot; 50th: 12/11/21: 46# on 3rd slot   Time 12    Period Weeks    Status On-going    Target Date 01/25/22      OT LONG TERM GOAL #3   Title Pt. will improve right lateral pinch strength by 5# to assist  with cutting food    Baseline Eval: R: 17, L: 25; 20th: R: 18; 40th: 15# fingers slipping on meter, 50th: 9# fingers slipping   Time 12    Period Weeks    Status On-going    Target Date 01/25/22      OT LONG TERM GOAL #4   Title Pt. will improve Right 3pt pinch by 2# to be able to hold/open items for cooking    Baseline Eval: R: Pt. unable to engage thumb L: 29#; 20th: R unable; 40th:  unable, fingers slipping; 50th:  unable, fingers slipping   Time 12    Period Weeks    Status On-going    Target Date 01/25/22      OT LONG TERM GOAL #5   Title Pt. will improve right hand Navos skills to be able to independently manipulate buttons, and zippers.    Baseline Eval: Pt. has difficulty managing buttons,a nd zippers. 10th visit:  improving; 20th: unable to engage R hand effectively, performs 1 handed with L; 50th: performs L handed only   Time 12    Period Weeks    Status On-going    Target Date 01/25/22      OT LONG TERM GOAL #6   Title Pt. will independently write his name    Baseline Eval: Pt. is unable to hold a pen; 20th: pt can grip PenAgain with R hand and can make marks on paper with difficulty, but not yet writing name; 40th:  continues to demonstrate difficulty with grasp of pen; 50th 12/11/21: With assist to set up hand on pen again, pt was able to write first name and initial of last name in 1 1/4" tall letters with fair legibility (pt wants to defer this goal, states this is low on his list of priorities for now)   Time 12    Period Weeks    Status  Defer   Target Date 01/25/22      OT LONG TERM GOAL #7   Title Pt. will improve FOTO score by 2 points to reflect funational improvement    Baseline Eval: FOTO score 46 with TR score 52; 20th: 45; 40th: 87;  50th: 46   Time 12    Period Weeks    Status On-going    Target Date 01/25/22             Plan -    Clinical Impression Statement No significant changes in FOTO score this period, though pt verbalizes that he's able to carry more grocery bags with the R arm, and pt reports that he's engaging the R arm more when he folds clothes.  This period pt has focussed on oppositional movements with the thumb, as thumb continues to slip with trials at lateral and 3 point pinch patterns which limits his ability to pick up smaller objects with the R hand.  Pt will continue to work towards neuro re-ed of right hand with emphasis on thumb movements to integrate into functional grasping and manipulation skills.    OT Occupational Profile and History Detailed Assessment- Review of Records and additional review of physical, cognitive, psychosocial history related to current functional performance    Occupational performance deficits (Please refer to evaluation for details): ADL's;IADL's;Education    Rehab Potential Good    Clinical Decision Making Several treatment options, min-mod task modification necessary    Comorbidities Affecting Occupational Performance: May have comorbidities impacting occupational performance    Modification or Assistance to Complete Evaluation  Min-Moderate modification  of tasks or assist with assess necessary to complete eval    OT Frequency 2x / week    OT Duration 12 weeks    OT Treatment/Interventions Self-care/ADL training;DME and/or AE instruction;Therapeutic exercise;Ultrasound;Neuromuscular education;Therapeutic activities;Energy conservation;Moist Heat;Patient/family education;Splinting;Functional Mobility Training;Paraffin    Plan Neuro re-ed and therapeutic exercise     Consulted and Agree with Plan of Care Patient             Leta Speller, MS, OTR/L   Darleene Cleaver, OT 12/12/2021, 10:56 AM

## 2021-12-14 ENCOUNTER — Ambulatory Visit: Payer: Medicare Other

## 2021-12-14 ENCOUNTER — Encounter: Payer: Medicare HMO | Admitting: Speech Pathology

## 2021-12-14 DIAGNOSIS — M6281 Muscle weakness (generalized): Secondary | ICD-10-CM

## 2021-12-14 DIAGNOSIS — R278 Other lack of coordination: Secondary | ICD-10-CM

## 2021-12-14 DIAGNOSIS — G8929 Other chronic pain: Secondary | ICD-10-CM

## 2021-12-14 DIAGNOSIS — Z8673 Personal history of transient ischemic attack (TIA), and cerebral infarction without residual deficits: Secondary | ICD-10-CM

## 2021-12-14 NOTE — Therapy (Signed)
[place] [facility] SERVICES [address] [place], [place], [address] Phone: [phone]   Fax:  [phone]  Patient Details  Name: David Reeves MRN: [id number] Date of Birth: December 18, 1970 Referring Provider:  [name], PA-C  Encounter Date: 12/14/2021   OUTPATIENT PHYSICAL THERAPY TREATMENT NOTE      Patient Name: David Reeves MRN: [id number] DOB:1970/07/16, 51 y.o., male Today's Date: 12/14/2021  PCP: [name], PA-C REFERRING PROVIDER: [name], PA-C   PT End of Session - 12/14/21 1107     Visit Number 43    Number of Visits 19    Date for PT Re-Evaluation 02/05/22    Authorization Type [employer]    Progress Note Due on Visit 63    PT Start Time 1016    PT Stop Time 1058    PT Time Calculation (min) 42 min    Equipment Utilized During Treatment Gait belt    Activity Tolerance Patient tolerated treatment well;No increased pain    Behavior During Therapy [facility] for tasks assessed/performed                   Past Medical History:  Diagnosis Date   Back pain 04/22/2012   Bone spur    Bulging disc 04/22/2012   Degenerative disc disease    Osteoarthritis    Panic anxiety syndrome    Stroke ([place]) 10/2020   Taking multiple medications for chronic disease    Past Surgical History:  Procedure Laterality Date   CYSTECTOMY     head   HERNIA REPAIR Left 2006,2014   [facility]   TOE SURGERY Right 2007   Patient Active Problem List   Diagnosis Date Noted   Alterations of sensations following cerebrovascular accident 03/08/2021   Aphasia as late effect of cerebrovascular accident (CVA) 03/08/2021   Muscle spasm 02/22/2021   Acute non-recurrent frontal sinusitis 02/22/2021   Chronic pain syndrome 01/23/2021   Primary hypertension 01/23/2021   Nerve pain 01/23/2021   Erectile dysfunction due to diseases classified elsewhere 01/23/2021   Cognitive dysfunction 01/23/2021   Scrotal  edema 01/23/2021   Elevated LDL cholesterol level 01/23/2021   History of stroke with residual deficit 01/23/2021   Primary insomnia 01/23/2021   Mouth pain 01/23/2021   Laceration of right hand without foreign body    Closed compression fracture of L1 vertebra ([place]) 05/24/2016   Lumbar stenosis with neurogenic claudication 11/01/2015   Chronic 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 11/08/2014   Hx of hemorrhoids 11/08/2014   AAA (abdominal aortic aneurysm) ([place]) 09/22/2014   Chronic pain associated with significant psychosocial dysfunction 09/22/2014   Panic disorder 09/22/2014   AB (asthmatic bronchitis) 08/17/2014   Anxiety 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 08/17/2014   Backache 08/17/2014   Lumbosacral spondylosis without myelopathy 08/17/2014   Disorder of male genital organs 08/17/2014   Brash 08/17/2014   Low back pain 08/17/2014   Tendon nodule 08/17/2014   Episodic paroxysmal anxiety disorder 08/17/2014   Hernia, inguinal, right 08/17/2014   Fast heart beat 08/17/2014   Illness 08/17/2014   Inguinal hernia 10/14/2012    REFERRING DIAG: History of stroke with residual deficit, chronic 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, Lumbosacral spondylosis without myelopathy THERAPY DIAG:  Muscle weakness (generalized)  Chronic bilateral low back pain without sciatica  Rationale for Evaluation and Treatment Rehabilitation  PERTINENT HISTORY: Pt. is a 51 y.o. who was diagnosed with a CVA on July 21st, 2022. Pt. completed  several weeks of inpatient rehab at [facility]. After returning to home. He was discharged in late August/early September and recieved home health PT. Pt. sustained a fall in december of 2022, and was admitted to the hospital with COVID-19, and Chronic back pain. He reports chronic back pain syndrome since 2012. Since the most recent discharge, Pt. has been residing with the ex-wife until the Pt. is ready to return to independent living. He  did recieve 2 weeks of home health after discharge in December 2022. He is now being referred to outpatient PT to address weakness from stroke and improve fine motor movement. He reports rarely getting numbness/tingling in RLE. PMHx includes: Bilateral LBP with right sided sciatica, Lumbosacral spondylosis myelopathy, closed compression Fx of L1. Has chronic insomnia. osteoarthritis, Panic anxiety syndrome (taking medication) Pt. enjoys cooking, and riding motorcycles. Patient is going to [facility] for Epid[facility] spinal injections on 06/07/21;  PRECAUTIONS: Fall, Spinal Brace wears daily, approximately 25%, when exercising; semi-rigid lumbar brace  SUBJECTIVE: Pain is currently 2/10. Reports no recent changes or concerns.   PAIN:  Are you having pain? Yes: NPRS scale: 2/10 Pain location: bilat low back Pain description: dull ache; always constant,  Aggravating factors: worse with sitting,  Relieving factors: heat helps temporarily, pain pills/pain patch   TODAY'S TREATMENT 12/14/21  Heat donned to low back while pt performs the following. Skin checked prior to placement and following removal of hot pack. Skin WNL.   TherEx-  On mat table: LTR 20x  10# donned: SLR 2x12-14 sets each LE. Rates easy-medium (improvement from previous session). Marches 2x20 Alt LE. Very challenging in part due to coordination per pt report, easy strength-wise    Hooklying adductor squeezes with p.ball 15x 3 sec holds. Improved RLE activation today Glute bridge 20x Side-lying hip abduction 1x15, 1x10  each LE. Improved coordination.   Seated hamstring curls at cable machine: R 2.5# 3x12 L: 7.5# 3x12  Fatiguing, challenging  On mat table:  LTR 5x each side  Open book 10x each side  Knee to chest stretch 30 sec each side    PATIENT EDUCATION:  Education details: exercise technique, body mechanics Person educated: Patient Education method: Explanation, Demonstration, Tactile cues, and Verbal  cues Education comprehension: verbalized understanding, returned demonstration, verbal cues required, tactile cues required, and needs further education   HOME EXERCISE PROGRAM:   No updates on this date pt to continue HEP as previously given     PT Short Term Goals -       PT SHORT TERM GOAL #1   Title Patient will be adherent to HEP at least 3x a week to improve functional strength and balance for better safety at home.    Baseline 4/4: doing them 1-2x a week; 08/20/2021=Patient reports performing his low back stretching and no questions currently HEP. 10/24/21: doing HEP 4x a week   Time 4    Period Weeks    Status Achieved    Target Date 08/21/21      PT SHORT TERM GOAL #2   Title Patient (< 68 years old) will complete five times sit to stand test in < 15 seconds indicating an increased LE strength and improved balance.    Baseline 4/4: 17.85 sec with arms across chest; 08/20/2021= 17.6 sec , 7/5: 12.85 sec   Time 4    Period Weeks    Status Achieved;    Target Date 10/01/21  PT Long Term Goals -       PT LONG TERM GOAL #1   Title Patient will increase six minute walk test distance to >1000 for progression to community ambulator and improve gait ability    Baseline 4/4: 1115 feet , 7/5: 1080 feet;    Time 4    Period Weeks    Status Achieved    Target Date 08/21/21      PT LONG TERM GOAL #2   Title Patient will ascend/descend 8 stairs without rail assist independently without loss of balance to improve ability to get in/out of home.    Baseline 4/4: requires 1 rail assist; 08/20/2021- Patient demonstrated steps without railing using reciprocal steps today. 7/5: ascend/descend 6 steps reciprocally without rail, but required CGA for safety when descending; 7/25: Able to complete without UE support and good balance going up steps, unsafe descending steps without UE support. Required min assist and UE support to prevent LOB descending.; 8/15: requires UUE support to  descend and close CGA due to unsteadiness    Time 4    Period Weeks    Status On-going    Target Date 12/11/2021      PT LONG TERM GOAL #3   Title Patient will increase BLE gross strength to 4+/5 as to improve functional strength for independent gait, increased standing tolerance and increased ADL ability.    Baseline 4/4: not formally assessed; 08/20/2021= 4+/5 with Right hip flex/knee ext/flex; Right hip abd= 4/5, 7/5: 4+/5 hip    Time 12    Period Weeks    Status Achieved;    Target Date 11/12/21      PT LONG TERM GOAL #4   Title Patient will improve FOTO score to >50% to indicate improved functional mobility with less pain with ADLs.    Baseline 4/4: 35%; 08/20/2021= 42% 7/5: 47%; 7:25:recently addressed; 11/30/21: 35   Time 12    Period Weeks    Status Partially Met   Target Date 02/05/2022      PT LONG TERM GOAL #5   Title Patient will report a worst pain of 4/10 in low back over last week to indicate improved tolerance with ADLs.    Baseline 4/4: 7/10; 08/20/2021= 4/10 , 7/5: worst 7/10 in low back; 7/25: 6/10 ; 8/15: pt reports 6/10   Time 12    Period Weeks    Status  On-going   Target Date 02/05/2022      Additional Long Term Goals   Additional Long Term Goals Yes      PT LONG TERM GOAL #6   Title Pt will increase 10MWT by at least 0.13 m/s in order to demonstrate clinically significant improvement in community ambulation    Baseline 08/20/2021=0.94 m/s 7/5: 1.5 m/s   Time 12    Period Weeks    Status Achieved;    Target Date 11/12/21      PT LONG TERM GOAL #7   Title Patient will demonstrate improved static standing balance as seen by ability to single leg stance on right LE > 12 sec consistently for optimal balance on level and unlevel surfaces.    Baseline 08/20/2021= 4 sec SLS on right , 7/5: 4-5 sec inconsistent; 7/25 able to achieve 15 sec after multiple attempts of 4-5 sec holds; 8/15: LLE 15 sec, RLE 8 sec   Time 12    Period Weeks    Status Partially Met   Target  Date 02/05/2022  Plan -     Clinical Impression Statement Pt making steady progress. He continues to tolerate increased volume and resistance well without increased back pain. PT instructed pt in seated hamstring curls today. While fatiguing and challenging, pt able to complete without pain. The pt will benefit from further skilled PT to improve strength and balance, and decrease pain levels.   Personal Factors and Comorbidities Comorbidity 3+;Past/Current Experience;Time since onset of injury/illness/exacerbation    Comorbidities PMHx includes: Bilateral LBP with right sided sciatica, Lumbosacral spondylosis myelopathy, closed compression Fx of L1. Has chronic insomnia. osteoarthritis, Panic anxiety syndrome (taking medication)    Examination-Activity Limitations Bend;Carry;Locomotion Level;Sit;Squat;Stairs;Stand;Transfers    Examination-Participation Restrictions Cleaning;Community Activity;Driving;Laundry;Meal Prep;Occupation;Shop;Volunteer;Yard Work    Stability/Clinical Decision Making Stable/Uncomplicated    Rehab Potential Good    PT Frequency 2x / week    PT Duration 12 weeks    PT Treatment/Interventions Cryotherapy;Electrical Stimulation;Moist Heat;Traction;Gait training;Stair training;Functional mobility training;Therapeutic activities;Therapeutic exercise;Balance training;Neuromuscular re-education;Patient/family education;Manual techniques;Passive range of motion;Dry needling;Energy conservation    PT Next Visit Plan  Progress core stabilization, Manual therapy for low back and LE ROM, instruct in balance HEP, continue plan       Consulted and Agree with Plan of Care Patient            11:11 AM, 12/14/21   Zollie Pee, PT 12/14/2021, 11:11 AM  [facility] [address] [place], [place], [address] Phone: [phone]   Fax:  [phone]

## 2021-12-15 NOTE — Therapy (Signed)
OUTPATIENT OCCUPATIONAL THERAPY TREATMENT NOTE   Patient Name: David Reeves MRN: [id number] DOB:07/26/1970, 51 y.o., male Today's Date: 12/15/2021  PCP: [name], PA-C REFERRING PROVIDER: [name], PA-C  END OF SESSION:      OT End of Session - 12/15/21 1009     Visit Number 51    Number of Visits 70    Date for OT Re-Evaluation 01/25/22    Authorization Type Progress reporting period starting 12/11/21    OT Start Time 0915    OT Stop Time 1000    OT Time Calculation (min) 45 min    Activity Tolerance Patient tolerated treatment well    Behavior During Therapy [facility] for tasks assessed/performed               Past Medical History:  Diagnosis Date   Back pain 04/22/2012   Bone spur    Bulging disc 04/22/2012   Degenerative disc disease    Osteoarthritis    Panic anxiety syndrome    Stroke ([place]) 10/2020   Taking multiple medications for chronic disease    Past Surgical History:  Procedure Laterality Date   CYSTECTOMY     head   HERNIA REPAIR Left 2006,2014   [facility]   TOE SURGERY Right 2007   Patient Active Problem List   Diagnosis Date Noted   Alterations of sensations following cerebrovascular accident 03/08/2021   Aphasia as late effect of cerebrovascular accident (CVA) 03/08/2021   Muscle spasm 02/22/2021   Acute non-recurrent frontal sinusitis 02/22/2021   Chronic pain syndrome 01/23/2021   Primary hypertension 01/23/2021   Nerve pain 01/23/2021   Erectile dysfunction due to diseases classified elsewhere 01/23/2021   Cognitive dysfunction 01/23/2021   Scrotal edema 01/23/2021   Elevated LDL cholesterol level 01/23/2021   History of stroke with residual deficit 01/23/2021   Primary insomnia 01/23/2021   Mouth pain 01/23/2021   Laceration of right hand without foreign body    Closed compression fracture of L1 vertebra ([place]) 05/24/2016   Lumbar stenosis with neurogenic claudication 11/01/2015   Chronic bilateral low back pain with  right-sided sciatica 11/08/2014   Hx of hemorrhoids 11/08/2014   AAA (abdominal aortic aneurysm) ([place]) 09/22/2014   Chronic pain associated with significant psychosocial dysfunction 09/22/2014   Panic disorder 09/22/2014   AB (asthmatic bronchitis) 08/17/2014   Anxiety 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 08/17/2014   Backache 08/17/2014   Lumbosacral spondylosis without myelopathy 08/17/2014   Disorder of male genital organs 08/17/2014   Brash 08/17/2014   Low back pain 08/17/2014   Tendon nodule 08/17/2014   Episodic paroxysmal anxiety disorder 08/17/2014   Hernia, inguinal, right 08/17/2014   Fast heart beat 08/17/2014   Illness 08/17/2014   Inguinal hernia 10/14/2012    ONSET DATE: 11/09/2020  REFERRING DIAG: CVA  THERAPY DIAG:  Muscle weakness (generalized)  Other lack of coordination  H/O ischemic left MCA stroke  Rationale for Evaluation and Treatment Rehabilitation  PERTINENT HISTORY: Pt. is a 51 y.o. who was diagnosed with a CVA on July 21st, 2022. Pt. completed several weeks of  inpatient rehab at [facility]. After returning to home, Pt. sustained a fall in december of 2022, and was admitted to the hospital with COVI-19, and back pain from the fall. Since the most recent discharge, Pt. has been residing with the ex-wife until the Pt. is ready to return to independent living. Pt. PMHx includes: Bilateral LBP with right sided sciatica, Lumbosacral spondylosis myelopathy, closed compression Fx of L1. Pt. enjoys  cooking, and riding motorcycles.   PRECAUTIONS: Fall  SUBJECTIVE: Pt reports doing he's doing excellent today and states he is feeling stronger throughout his RUE. PAIN:  Are you having pain? Yes: NPRS scale: 2/10 Pain location: low back Pain description: aching Aggravating factors: prolonged sitting or lying down Relieving factors: rest, heat, walking   OBJECTIVE:  TODAY'S TREATMENT:  Therapeutic Exercise: Performed AAROM for R thumb radial abd, place and  hold for same, and resisted lateral pinch x3 sets 10 reps each.  Completed R hand active digit abd, active assisted add for minimizing PIP flexion (improved), and active assisted R thumb palmar abd/add x3 sets 10 reps.    Neuro re-ed: Facilitation of grasp and release patterns with use of Velcro 1 inch squares, difficulty with removing from board, switched to working on grasping pattern with placing with thumb and finger combination picking up cube from table top and placing onto towel.     PATIENT EDUCATION: Education details: HEP progression, grasp/release activities Person educated: Patient Education method: Explanation Education comprehension: verbalized and demonstrated understanding.      OT Short Term Goals -      OT SHORT TERM GOAL #1   Title Pt. wil demonstrtae independence with HEPs    Baseline Eval: Pt. currently does not have one, 10th visit:  changes in HEP ongoing; 20th: instructed in putty exercises, changes ongoing; 40th: continual updates to HEP as pt progresses; 50th: continual updates   Time 6    Period Weeks    Status On-going    Target Date 11/20/21             OT Long Term Goals -       OT LONG TERM GOAL #1   Title Pt. will improve RUE strength by 2 mm grades to assist with ADLs, and IADLs,    Baseline Eval: Right shoulder flexion, abduction 3/5, elbow flexion, extension wrist extension 3+/5, 10th visit: improving with RUE strength by not yet met goal; 20th visit: R shd flex/abd 4-, elbow flex/ext 4+, wrist flex 4-, wrist ext 4+; 40:  R shd flex/abd 4,  elbow flex/ext 4+, wrist flex 4, wrist ext 4+; 50th visit 8/22: R shoulder flex/abd 4/5, elbow flex/ext 5/5, wrist flex 4, wrist ext 4+   Time 12    Period Weeks    Status On-going    Target Date 01/25/22      OT LONG TERM GOAL #2   Title Pt. will improve right grip by 10 lbs to prepare for firmly holding objects for IADLs.    Baseline Eval: R: 38#, L: 124# (In 3rd dynamometer slot); R 35# in 4th slot,  28# in 3rd slot, 40th: 47 in 4th slot; 50th: 12/11/21: 46# on 3rd slot   Time 12    Period Weeks    Status On-going    Target Date 01/25/22      OT LONG TERM GOAL #3   Title Pt. will improve right lateral pinch strength by 5# to assist with cutting food    Baseline Eval: R: 17, L: 25; 20th: R: 18; 40th: 15# fingers slipping on meter, 50th: 9# fingers slipping   Time 12    Period Weeks    Status On-going    Target Date 01/25/22      OT LONG TERM GOAL #4   Title Pt. will improve Right 3pt pinch by 2# to be able to hold/open items for cooking    Baseline Eval: R: Pt. unable to engage thumb L:  29#; 20th: R unable; 40th:  unable, fingers slipping; 50th:  unable, fingers slipping   Time 12    Period Weeks    Status On-going    Target Date 01/25/22      OT LONG TERM GOAL #5   Title Pt. will improve right hand [facility] skills to be able to independently manipulate buttons, and zippers.    Baseline Eval: Pt. has difficulty managing buttons,a nd zippers. 10th visit:  improving; 20th: unable to engage R hand effectively, performs 1 handed with L; 50th: performs L handed only   Time 12    Period Weeks    Status On-going    Target Date 01/25/22      OT LONG TERM GOAL #6   Title Pt. will independently write his name    Baseline Eval: Pt. is unable to hold a pen; 20th: pt can grip PenAgain with R hand and can make marks on paper with difficulty, but not yet writing name; 40th:  continues to demonstrate difficulty with grasp of pen; 50th 12/11/21: With assist to set up hand on pen again, pt was able to write first name and initial of last name in 1 1/4" tall letters with fair legibility (pt wants to defer this goal, states this is low on his list of priorities for now)   Time 12    Period Weeks    Status Defer   Target Date 01/25/22      OT LONG TERM GOAL #7   Title Pt. will improve FOTO score by 2 points to reflect funational improvement    Baseline Eval: FOTO score 46 with TR score 52; 20th: 45;  40th: 30;  50th: 46   Time 12    Period Weeks    Status On-going    Target Date 01/25/22             Plan -    Clinical Impression Statement Pt continues to require active assist for achieving R thumb radial abd and full palmar abduction.  Pt struggled to remove wooden cubes from velcro board without thumb slipping, but was more consistent (less thumb slipping) in 3 point grasp working without resistance.  Pt will continue to work towards neuro re-ed of right hand with emphasis on thumb movements to integrate into functional grasping and manipulation skills.    OT Occupational Profile and History Detailed Assessment- Review of Records and additional review of physical, cognitive, psychosocial history related to current functional performance    Occupational performance deficits (Please refer to evaluation for details): ADL's;IADL's;Education    Rehab Potential Good    Clinical Decision Making Several treatment options, min-mod task modification necessary    Comorbidities Affecting Occupational Performance: May have comorbidities impacting occupational performance    Modification or Assistance to Complete Evaluation  Min-Moderate modification of tasks or assist with assess necessary to complete eval    OT Frequency 2x / week    OT Duration 12 weeks    OT Treatment/Interventions Self-care/ADL training;DME and/or AE instruction;Therapeutic exercise;Ultrasound;Neuromuscular education;Therapeutic activities;Energy conservation;Moist Heat;Patient/family education;Splinting;Functional Mobility Training;Paraffin    Plan Neuro re-ed and therapeutic exercise    Consulted and Agree with Plan of Care Patient             Leta Speller, MS, OTR/L   Darleene Cleaver, OT 12/15/2021, 10:12 AM

## 2021-12-18 ENCOUNTER — Other Ambulatory Visit: Payer: Self-pay | Admitting: Physician Assistant

## 2021-12-18 ENCOUNTER — Ambulatory Visit: Payer: Medicare Other

## 2021-12-18 ENCOUNTER — Encounter: Payer: Medicare HMO | Admitting: Speech Pathology

## 2021-12-18 DIAGNOSIS — G8929 Other chronic pain: Secondary | ICD-10-CM

## 2021-12-18 DIAGNOSIS — M6281 Muscle weakness (generalized): Secondary | ICD-10-CM | POA: Diagnosis not present

## 2021-12-18 DIAGNOSIS — Z8673 Personal history of transient ischemic attack (TIA), and cerebral infarction without residual deficits: Secondary | ICD-10-CM

## 2021-12-18 DIAGNOSIS — R278 Other lack of coordination: Secondary | ICD-10-CM

## 2021-12-18 DIAGNOSIS — I6930 Unspecified sequelae of cerebral infarction: Secondary | ICD-10-CM

## 2021-12-18 DIAGNOSIS — M545 Other chronic pain: Secondary | ICD-10-CM

## 2021-12-18 NOTE — Therapy (Signed)
[place] MAIN [facility] SERVICES [address] [place], [place], [address] Phone: [phone]   Fax:  [phone]  Patient Details  Name: David Reeves MRN: [id number] Date of Birth: January 22, 1971 Referring Provider:  [name], PA-C  Encounter Date: 12/18/2021   OUTPATIENT PHYSICAL THERAPY TREATMENT NOTE      Patient Name: David Reeves MRN: [id number] DOB:December 11, 1970, 51 y.o., male Today's Date: 12/18/2021  PCP: [name], PA-C REFERRING PROVIDER: [name], PA-C   PT End of Session - 12/18/21 1004     Visit Number 44    Number of Visits 5    Date for PT Re-Evaluation 02/05/22    Authorization Type [employer]    Progress Note Due on Visit 34    PT Start Time 1004    PT Stop Time 1050    PT Time Calculation (min) 46 min    Equipment Utilized During Treatment Gait belt    Activity Tolerance Patient tolerated treatment well;No increased pain    Behavior During Therapy [facility] for tasks assessed/performed                    Past Medical History:  Diagnosis Date   Back pain 04/22/2012   Bone spur    Bulging disc 04/22/2012   Degenerative disc disease    Osteoarthritis    Panic anxiety syndrome    Stroke ([place]) 10/2020   Taking multiple medications for chronic disease    Past Surgical History:  Procedure Laterality Date   CYSTECTOMY     head   HERNIA REPAIR Left 2006,2014   [facility]   TOE SURGERY Right 2007   Patient Active Problem List   Diagnosis Date Noted   Alterations of sensations following cerebrovascular accident 03/08/2021   Aphasia as late effect of cerebrovascular accident (CVA) 03/08/2021   Muscle spasm 02/22/2021   Acute non-recurrent frontal sinusitis 02/22/2021   Chronic pain syndrome 01/23/2021   Primary hypertension 01/23/2021   Nerve pain 01/23/2021   Erectile dysfunction due to diseases classified elsewhere 01/23/2021   Cognitive dysfunction 01/23/2021   Scrotal  edema 01/23/2021   Elevated LDL cholesterol level 01/23/2021   History of stroke with residual deficit 01/23/2021   Primary insomnia 01/23/2021   Mouth pain 01/23/2021   Laceration of right hand without foreign body    Closed compression fracture of L1 vertebra ([place]) 05/24/2016   Lumbar stenosis with neurogenic claudication 11/01/2015   Chronic 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 11/08/2014   Hx of hemorrhoids 11/08/2014   AAA (abdominal aortic aneurysm) ([place]) 09/22/2014   Chronic pain associated with significant psychosocial dysfunction 09/22/2014   Panic disorder 09/22/2014   AB (asthmatic bronchitis) 08/17/2014   Anxiety 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 08/17/2014   Backache 08/17/2014   Lumbosacral spondylosis without myelopathy 08/17/2014   Disorder of male genital organs 08/17/2014   Brash 08/17/2014   Low back pain 08/17/2014   Tendon nodule 08/17/2014   Episodic paroxysmal anxiety disorder 08/17/2014   Hernia, inguinal, right 08/17/2014   Fast heart beat 08/17/2014   Illness 08/17/2014   Inguinal hernia 10/14/2012    REFERRING DIAG: History of stroke with residual deficit, chronic 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, Lumbosacral spondylosis without myelopathy THERAPY DIAG:  Chronic bilateral low back pain without sciatica  Muscle weakness (generalized)  Rationale for Evaluation and Treatment Rehabilitation  PERTINENT HISTORY: Pt. is a 51 y.o. who was diagnosed with a CVA on July 21st, 2022. Pt.  completed several weeks of inpatient rehab at [facility]. After returning to home. He was discharged in late August/early September and recieved home health PT. Pt. sustained a fall in december of 2022, and was admitted to the hospital with COVID-19, and Chronic back pain. He reports chronic back pain syndrome since 2012. Since the most recent discharge, Pt. has been residing with the ex-wife until the Pt. is ready to return to independent living. He  did recieve 2 weeks of home health after discharge in December 2022. He is now being referred to outpatient PT to address weakness from stroke and improve fine motor movement. He reports rarely getting numbness/tingling in RLE. PMHx includes: Bilateral LBP with right sided sciatica, Lumbosacral spondylosis myelopathy, closed compression Fx of L1. Has chronic insomnia. osteoarthritis, Panic anxiety syndrome (taking medication) Pt. enjoys cooking, and riding motorcycles. Patient is going to [facility] for Epidural spinal injections on 06/07/21;  PRECAUTIONS: Fall, Spinal Brace wears daily, approximately 25%, when exercising; semi-rigid lumbar brace  SUBJECTIVE: Pt had a good weekend. Pt reports pain went down to 1/10. He went to [facility] over the weekend and still experienced increased back pain with waiting in line at the store. Pain has mostly been a 1/10.  PAIN:  Are you having pain? Yes: NPRS scale: 1/10 Pain location: bilat low back Pain description: dull ache; always constant,  Aggravating factors: worse with sitting,  Relieving factors: heat helps temporarily, pain pills/pain patch   TODAY'S TREATMENT 12/18/21  Heat donned to low back while pt performs the following. Pt reports heat feels good to low back. No adverse reaction to treatment. Pt reports nothing currently applied to low back.  TherEx-  On mat table: LTR 20x Green p.ball curls 20x Glute bridge with LE on green p.ball 2x15. Pt rates easy  10# donned: SLR 15x each LE. Rates medium-hard Marches 15x each LE.  Side-lying hip abduction 20x. Improved coordination.  Seated hamstring curls at cable machine: R 2.5# 2x20 L: 7.5# 2x20  Reports easier today, not fatiguing  Seated 5# weights donned: LAQ 2x20 alt LE. Pt rates medium  On mat table:  LTR 10x each side Knee to chest stretch 30 sec each side   Bridges 1x10  Seated trunk twist 10x       PATIENT EDUCATION:  Education details: exercise technique, body  mechanics Person educated: Patient Education method: Explanation, Demonstration, Tactile cues, and Verbal cues Education comprehension: verbalized understanding, returned demonstration, verbal cues required, tactile cues required, and needs further education   HOME EXERCISE PROGRAM:   No updates on this date pt to continue HEP as previously given     PT Short Term Goals -       PT SHORT TERM GOAL #1   Title Patient will be adherent to HEP at least 3x a week to improve functional strength and balance for better safety at home.    Baseline 4/4: doing them 1-2x a week; 08/20/2021=Patient reports performing his low back stretching and no questions currently HEP. 10/24/21: doing HEP 4x a week   Time 4    Period Weeks    Status Achieved    Target Date 08/21/21      PT SHORT TERM GOAL #2   Title Patient (< 48 years old) will complete five times sit to stand test in < 15 seconds indicating an increased LE strength and improved balance.    Baseline 4/4: 17.85 sec with arms across chest; 08/20/2021= 17.6 sec , 7/5: 12.85 sec   Time  4    Period Weeks    Status Achieved;    Target Date 10/01/21              PT Long Term Goals -       PT LONG TERM GOAL #1   Title Patient will increase six minute walk test distance to >1000 for progression to community ambulator and improve gait ability    Baseline 4/4: 1115 feet , 7/5: 1080 feet;    Time 4    Period Weeks    Status Achieved    Target Date 08/21/21      PT LONG TERM GOAL #2   Title Patient will ascend/descend 8 stairs without rail assist independently without loss of balance to improve ability to get in/out of home.    Baseline 4/4: requires 1 rail assist; 08/20/2021- Patient demonstrated steps without railing using reciprocal steps today. 7/5: ascend/descend 6 steps reciprocally without rail, but required CGA for safety when descending; 7/25: Able to complete without UE support and good balance going up steps, unsafe descending steps  without UE support. Required min assist and UE support to prevent LOB descending.; 8/15: requires UUE support to descend and close CGA due to unsteadiness    Time 4    Period Weeks    Status On-going    Target Date 12/11/2021      PT LONG TERM GOAL #3   Title Patient will increase BLE gross strength to 4+/5 as to improve functional strength for independent gait, increased standing tolerance and increased ADL ability.    Baseline 4/4: not formally assessed; 08/20/2021= 4+/5 with Right hip flex/knee ext/flex; Right hip abd= 4/5, 7/5: 4+/5 hip    Time 12    Period Weeks    Status Achieved;    Target Date 11/12/21      PT LONG TERM GOAL #4   Title Patient will improve FOTO score to >50% to indicate improved functional mobility with less pain with ADLs.    Baseline 4/4: 35%; 08/20/2021= 42% 7/5: 47%; 7:25:recently addressed; 11/30/21: 35   Time 12    Period Weeks    Status Partially Met   Target Date 02/05/2022      PT LONG TERM GOAL #5   Title Patient will report a worst pain of 4/10 in low back over last week to indicate improved tolerance with ADLs.    Baseline 4/4: 7/10; 08/20/2021= 4/10 , 7/5: worst 7/10 in low back; 7/25: 6/10 ; 8/15: pt reports 6/10   Time 12    Period Weeks    Status  On-going   Target Date 02/05/2022      Additional Long Term Goals   Additional Long Term Goals Yes      PT LONG TERM GOAL #6   Title Pt will increase 10MWT by at least 0.13 m/s in order to demonstrate clinically significant improvement in community ambulation    Baseline 08/20/2021=0.94 m/s 7/5: 1.5 m/s   Time 12    Period Weeks    Status Achieved;    Target Date 11/12/21      PT LONG TERM GOAL #7   Title Patient will demonstrate improved static standing balance as seen by ability to single leg stance on right LE > 12 sec consistently for optimal balance on level and unlevel surfaces.    Baseline 08/20/2021= 4 sec SLS on right , 7/5: 4-5 sec inconsistent; 7/25 able to achieve 15 sec after multiple  attempts of 4-5 sec holds; 8/15: LLE 15  sec, RLE 8 sec   Time 12    Period Weeks    Status Partially Met   Target Date 02/05/2022              Plan -     Clinical Impression Statement Pt shows further progress by reporting first-time pain decrease to 1/10 over the past few days, with some increase/fluctuations with shopping. Pt only with minor increase in pain to 2/10 following seated LAQ. Otherwise he tolerated all other interventions without pain increase. Will continue to progress/modify interventions as appropriate. The pt will benefit from further skilled PT to improve strength and balance, and decrease pain levels.   Personal Factors and Comorbidities Comorbidity 3+;Past/Current Experience;Time since onset of injury/illness/exacerbation    Comorbidities PMHx includes: Bilateral LBP with right sided sciatica, Lumbosacral spondylosis myelopathy, closed compression Fx of L1. Has chronic insomnia. osteoarthritis, Panic anxiety syndrome (taking medication)    Examination-Activity Limitations Bend;Carry;Locomotion Level;Sit;Squat;Stairs;Stand;Transfers    Examination-Participation Restrictions Cleaning;Community Activity;Driving;Laundry;Meal Prep;Occupation;Shop;Volunteer;Yard Work    Stability/Clinical Decision Making Stable/Uncomplicated    Rehab Potential Good    PT Frequency 2x / week    PT Duration 12 weeks    PT Treatment/Interventions Cryotherapy;Electrical Stimulation;Moist Heat;Traction;Gait training;Stair training;Functional mobility training;Therapeutic activities;Therapeutic exercise;Balance training;Neuromuscular re-education;Patient/family education;Manual techniques;Passive range of motion;Dry needling;Energy conservation    PT Next Visit Plan  Progress core stabilization, Manual therapy for low back and LE ROM, instruct in balance HEP, continue plan       Consulted and Agree with Plan of Care Patient            10:58 AM, 12/18/21   Zollie Pee, PT 12/18/2021,  10:58 AM  [place] MAIN [facility] SERVICES [address] [place], [place], [address] Phone: [phone]   Fax:  [phone]

## 2021-12-18 NOTE — Therapy (Signed)
OUTPATIENT OCCUPATIONAL THERAPY TREATMENT NOTE   Patient Name: David Reeves MRN: [id number] DOB:12-03-1970, 51 y.o., male Today's Date: 12/18/2021  PCP: [name], PA-C REFERRING PROVIDER: [name], PA-C  END OF SESSION:      OT End of Session - 12/18/21 1317     Visit Number 63    Number of Visits 19    Date for OT Re-Evaluation 01/25/22    Authorization Type Progress reporting period starting 12/11/21    OT Start Time 0917    OT Stop Time 1000    OT Time Calculation (min) 43 min    Activity Tolerance Patient tolerated treatment well    Behavior During Therapy [facility] for tasks assessed/performed               Past Medical History:  Diagnosis Date   Back pain 04/22/2012   Bone spur    Bulging disc 04/22/2012   Degenerative disc disease    Osteoarthritis    Panic anxiety syndrome    Stroke ([place]) 10/2020   Taking multiple medications for chronic disease    Past Surgical History:  Procedure Laterality Date   CYSTECTOMY     head   HERNIA REPAIR Left 2006,2014   [facility]   TOE SURGERY Right 2007   Patient Active Problem List   Diagnosis Date Noted   Alterations of sensations following cerebrovascular accident 03/08/2021   Aphasia as late effect of cerebrovascular accident (CVA) 03/08/2021   Muscle spasm 02/22/2021   Acute non-recurrent frontal sinusitis 02/22/2021   Chronic pain syndrome 01/23/2021   Primary hypertension 01/23/2021   Nerve pain 01/23/2021   Erectile dysfunction due to diseases classified elsewhere 01/23/2021   Cognitive dysfunction 01/23/2021   Scrotal edema 01/23/2021   Elevated LDL cholesterol level 01/23/2021   History of stroke with residual deficit 01/23/2021   Primary insomnia 01/23/2021   Mouth pain 01/23/2021   Laceration of right hand without foreign body    Closed compression fracture of L1 vertebra ([place]) 05/24/2016   Lumbar stenosis with neurogenic claudication 11/01/2015   Chronic bilateral low back pain with  right-sided sciatica 11/08/2014   Hx of hemorrhoids 11/08/2014   AAA (abdominal aortic aneurysm) ([place]) 09/22/2014   Chronic pain associated with significant psychosocial dysfunction 09/22/2014   Panic disorder 09/22/2014   AB (asthmatic bronchitis) 08/17/2014   Anxiety 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 08/17/2014   Backache 08/17/2014   Lumbosacral spondylosis without myelopathy 08/17/2014   Disorder of male genital organs 08/17/2014   Brash 08/17/2014   Low back pain 08/17/2014   Tendon nodule 08/17/2014   Episodic paroxysmal anxiety disorder 08/17/2014   Hernia, inguinal, right 08/17/2014   Fast heart beat 08/17/2014   Illness 08/17/2014   Inguinal hernia 10/14/2012    ONSET DATE: 11/09/2020  REFERRING DIAG: CVA  THERAPY DIAG:  Muscle weakness (generalized)  Other lack of coordination  H/O ischemic left MCA stroke  Rationale for Evaluation and Treatment Rehabilitation  PERTINENT HISTORY: Pt. is a 51 y.o. who was diagnosed with a CVA on July 21st, 2022. Pt. completed several weeks of  inpatient rehab at [facility]. After returning to home, Pt. sustained a fall in december of 2022, and was admitted to the hospital with COVI-19, and back pain from the fall. Since the most recent discharge, Pt. has been residing with the ex-wife until the Pt. is ready to return to independent living. Pt. PMHx includes: Bilateral LBP with right sided sciatica, Lumbosacral spondylosis myelopathy, closed compression Fx of L1. Pt. enjoys  cooking, and riding motorcycles.   PRECAUTIONS: Fall  SUBJECTIVE: Pt arrived in good spirits.  Again stated that he was excellent today.  Pt acknowledged progress with RUE but verbalized frustration of not improving at a faster rate. PAIN:  Are you having pain? Yes: NPRS scale: 2/10 Pain location: low back Pain description: aching Aggravating factors: prolonged sitting or lying down Relieving factors: rest, heat, walking   OBJECTIVE:  TODAY'S TREATMENT:   Therapeutic Exercise: Performed AAROM for R thumb radial abd, place and hold for same, 2 sets 10 reps each.  Completed R hand active digit abd, active assisted add for minimizing PIP flexion (improved), active thumb MP flex/ext, and active assisted R thumb palmar abd/add x3 sets 10 reps.  Pt requires cues for rest periods, technique, and intermittent passive stretching between sets for reducing tone throughout R wrist and digits.  Neuro re-ed: Facilitation of grasp and release patterns working to remove ball pegs from pegboard using 2 and 3 point grasp patterns.  Pt able to pick up several today, but not without thumb slipping.  Pt reported R hand felt exhausted, despite frequent rest periods.  Moved forward with OT assisting to place R hand digits into grasp pattern noted above, then pt proceeded to pick up peg and release onto another surface at table top level without any manual assist.   PATIENT EDUCATION: Education details: HEP progression, grasp/release activities Person educated: Patient Education method: Explanation Education comprehension: verbalized and demonstrated understanding.      OT Short Term Goals -      OT SHORT TERM GOAL #1   Title Pt. wil demonstrtae independence with HEPs    Baseline Eval: Pt. currently does not have one, 10th visit:  changes in HEP ongoing; 20th: instructed in putty exercises, changes ongoing; 40th: continual updates to HEP as pt progresses; 50th: continual updates   Time 6    Period Weeks    Status On-going    Target Date 11/20/21             OT Long Term Goals -       OT LONG TERM GOAL #1   Title Pt. will improve RUE strength by 2 mm grades to assist with ADLs, and IADLs,    Baseline Eval: Right shoulder flexion, abduction 3/5, elbow flexion, extension wrist extension 3+/5, 10th visit: improving with RUE strength by not yet met goal; 20th visit: R shd flex/abd 4-, elbow flex/ext 4+, wrist flex 4-, wrist ext 4+; 40:  R shd flex/abd 4,  elbow  flex/ext 4+, wrist flex 4, wrist ext 4+; 50th visit 8/22: R shoulder flex/abd 4/5, elbow flex/ext 5/5, wrist flex 4, wrist ext 4+   Time 12    Period Weeks    Status On-going    Target Date 01/25/22      OT LONG TERM GOAL #2   Title Pt. will improve right grip by 10 lbs to prepare for firmly holding objects for IADLs.    Baseline Eval: R: 38#, L: 124# (In 3rd dynamometer slot); R 35# in 4th slot, 28# in 3rd slot, 40th: 47 in 4th slot; 50th: 12/11/21: 46# on 3rd slot   Time 12    Period Weeks    Status On-going    Target Date 01/25/22      OT LONG TERM GOAL #3   Title Pt. will improve right lateral pinch strength by 5# to assist with cutting food    Baseline Eval: R: 17, L: 25; 20th: R: 18; 40th:  15# fingers slipping on meter, 50th: 9# fingers slipping   Time 12    Period Weeks    Status On-going    Target Date 01/25/22      OT LONG TERM GOAL #4   Title Pt. will improve Right 3pt pinch by 2# to be able to hold/open items for cooking    Baseline Eval: R: Pt. unable to engage thumb L: 29#; 20th: R unable; 40th:  unable, fingers slipping; 50th:  unable, fingers slipping   Time 12    Period Weeks    Status On-going    Target Date 01/25/22      OT LONG TERM GOAL #5   Title Pt. will improve right hand [facility] skills to be able to independently manipulate buttons, and zippers.    Baseline Eval: Pt. has difficulty managing buttons,a nd zippers. 10th visit:  improving; 20th: unable to engage R hand effectively, performs 1 handed with L; 50th: performs L handed only   Time 12    Period Weeks    Status On-going    Target Date 01/25/22      OT LONG TERM GOAL #6   Title Pt. will independently write his name    Baseline Eval: Pt. is unable to hold a pen; 20th: pt can grip PenAgain with R hand and can make marks on paper with difficulty, but not yet writing name; 40th:  continues to demonstrate difficulty with grasp of pen; 50th 12/11/21: With assist to set up hand on pen again, pt was able to  write first name and initial of last name in 1 1/4" tall letters with fair legibility (pt wants to defer this goal, states this is low on his list of priorities for now)   Time 12    Period Weeks    Status Defer   Target Date 01/25/22      OT LONG TERM GOAL #7   Title Pt. will improve FOTO score by 2 points to reflect funational improvement    Baseline Eval: FOTO score 46 with TR score 52; 20th: 45; 40th: 69;  50th: 46   Time 12    Period Weeks    Status On-going    Target Date 01/25/22             Plan -    Clinical Impression Statement Pt continues to require active assist for achieving R thumb radial abd and full palmar abduction.  When working to remove ball pegs from pegboard using R hand, pt struggled with thumb slipping.  Pt reported R hand felt exhausted, despite frequent rest periods.  Moved forward with OT assisting to place R hand digits into grasp pattern noted above, then pt proceeded to pick up peg and release onto another surface at table top level without any manual assist or thumb slipping.  Pt will continue to work towards neuro re-ed of right hand with emphasis on thumb movements to integrate into functional grasping and manipulation skills.    OT Occupational Profile and History Detailed Assessment- Review of Records and additional review of physical, cognitive, psychosocial history related to current functional performance    Occupational performance deficits (Please refer to evaluation for details): ADL's;IADL's;Education    Rehab Potential Good    Clinical Decision Making Several treatment options, min-mod task modification necessary    Comorbidities Affecting Occupational Performance: May have comorbidities impacting occupational performance    Modification or Assistance to Complete Evaluation  Min-Moderate modification of tasks or assist with assess necessary to complete  eval    OT Frequency 2x / week    OT Duration 12 weeks    OT Treatment/Interventions  Self-care/ADL training;DME and/or AE instruction;Therapeutic exercise;Ultrasound;Neuromuscular education;Therapeutic activities;Energy conservation;Moist Heat;Patient/family education;Splinting;Functional Mobility Training;Paraffin    Plan Neuro re-ed and therapeutic exercise    Consulted and Agree with Plan of Care Patient             Leta Speller, MS, OTR/L   Darleene Cleaver, OT 12/18/2021, 1:19 PM

## 2021-12-21 ENCOUNTER — Ambulatory Visit: Payer: Medicare Other | Attending: Physician Assistant

## 2021-12-21 ENCOUNTER — Encounter: Payer: Medicare HMO | Admitting: Speech Pathology

## 2021-12-21 ENCOUNTER — Ambulatory Visit: Payer: Medicare Other

## 2021-12-21 DIAGNOSIS — R262 Difficulty in walking, not elsewhere classified: Secondary | ICD-10-CM | POA: Insufficient documentation

## 2021-12-21 DIAGNOSIS — M6281 Muscle weakness (generalized): Secondary | ICD-10-CM | POA: Insufficient documentation

## 2021-12-21 DIAGNOSIS — G8929 Other chronic pain: Secondary | ICD-10-CM | POA: Insufficient documentation

## 2021-12-21 DIAGNOSIS — Z8673 Personal history of transient ischemic attack (TIA), and cerebral infarction without residual deficits: Secondary | ICD-10-CM | POA: Insufficient documentation

## 2021-12-21 DIAGNOSIS — R482 Apraxia: Secondary | ICD-10-CM | POA: Insufficient documentation

## 2021-12-21 DIAGNOSIS — R2681 Unsteadiness on feet: Secondary | ICD-10-CM | POA: Insufficient documentation

## 2021-12-21 DIAGNOSIS — R278 Other lack of coordination: Secondary | ICD-10-CM | POA: Insufficient documentation

## 2021-12-21 DIAGNOSIS — M545 Low back pain, unspecified: Secondary | ICD-10-CM | POA: Insufficient documentation

## 2021-12-25 ENCOUNTER — Ambulatory Visit: Payer: Medicare Other

## 2021-12-25 ENCOUNTER — Encounter: Payer: Medicare HMO | Admitting: Speech Pathology

## 2021-12-27 ENCOUNTER — Encounter: Payer: Self-pay | Admitting: Physician Assistant

## 2021-12-27 ENCOUNTER — Ambulatory Visit (INDEPENDENT_AMBULATORY_CARE_PROVIDER_SITE_OTHER): Payer: Medicare Other | Admitting: Physician Assistant

## 2021-12-27 VITALS — BP 115/76 | Ht 73.0 in | Wt 211.7 lb

## 2021-12-27 DIAGNOSIS — G894 Chronic pain syndrome: Secondary | ICD-10-CM

## 2021-12-27 DIAGNOSIS — E7800 Pure hypercholesterolemia, unspecified: Secondary | ICD-10-CM

## 2021-12-27 DIAGNOSIS — Z23 Encounter for immunization: Secondary | ICD-10-CM

## 2021-12-27 DIAGNOSIS — I10 Essential (primary) hypertension: Secondary | ICD-10-CM | POA: Diagnosis not present

## 2021-12-27 DIAGNOSIS — I6930 Unspecified sequelae of cerebral infarction: Secondary | ICD-10-CM

## 2021-12-27 DIAGNOSIS — F09 Unspecified mental disorder due to known physiological condition: Secondary | ICD-10-CM

## 2021-12-27 DIAGNOSIS — F410 Panic disorder [episodic paroxysmal anxiety] without agoraphobia: Secondary | ICD-10-CM

## 2021-12-27 MED ORDER — ATORVASTATIN CALCIUM 80 MG PO TABS: 80.0000 mg | ORAL_TABLET | Freq: Every day | ORAL | 3 refills | Status: DC

## 2021-12-27 MED ORDER — AMLODIPINE BESYLATE 5 MG PO TABS: 5.0000 mg | ORAL_TABLET | Freq: Every day | ORAL | 3 refills | Status: DC

## 2021-12-27 MED ORDER — DONEPEZIL HCL 10 MG PO TABS: 10.0000 mg | ORAL_TABLET | Freq: Every day | ORAL | 1 refills | Status: DC

## 2021-12-27 MED ORDER — PRAZOSIN HCL 1 MG PO CAPS: 2.0000 mg | ORAL_CAPSULE | Freq: Every day | ORAL | 1 refills | Status: DC

## 2021-12-27 NOTE — Assessment & Plan Note (Signed)
Well controlled on current medications Ordered cmp 

## 2021-12-27 NOTE — Assessment & Plan Note (Signed)
Post stroke manages with Donepezil

## 2021-12-27 NOTE — Therapy (Signed)
[place] [facility] SERVICES [address] [place], [place], [address] Phone: [phone]   Fax:  [phone]  Patient Details  Name: David Reeves MRN: [id number] Date of Birth: [birth year] Referring Provider:  [name], PA-C  Encounter Date: 12/28/2021   OUTPATIENT PHYSICAL THERAPY TREATMENT NOTE      Patient Name: David Reeves MRN: [id number] DOB:Jan 05, 1971, 51 y.o., male Today's Date: 12/28/2021  PCP: [name], PA-C REFERRING PROVIDER: [name], PA-C   PT End of Session - 12/28/21 1020     Visit Number 45    Number of Visits 21    Date for PT Re-Evaluation 02/05/22    Authorization Type [employer]    Progress Note Due on Visit 24    PT Start Time 1020    PT Stop Time 1101    PT Time Calculation (min) 41 min    Equipment Utilized During Treatment Gait belt    Activity Tolerance Patient tolerated treatment well;No increased pain    Behavior During Therapy [facility] for tasks assessed/performed                     Past Medical History:  Diagnosis Date   Back pain 04/22/2012   Bone spur    Bulging disc 04/22/2012   Degenerative disc disease    Osteoarthritis    Panic anxiety syndrome    Stroke ([place]) 10/2020   Taking multiple medications for chronic disease    Past Surgical History:  Procedure Laterality Date   CYSTECTOMY     head   HERNIA REPAIR Left 2006,2014   [facility]   TOE SURGERY Right 2007   Patient Active Problem List   Diagnosis Date Noted   Alterations of sensations following cerebrovascular accident 03/08/2021   Aphasia as late effect of cerebrovascular accident (CVA) 03/08/2021   Muscle spasm 02/22/2021   Acute non-recurrent frontal sinusitis 02/22/2021   Chronic pain syndrome 01/23/2021   Primary hypertension 01/23/2021   Nerve pain 01/23/2021   Erectile dysfunction due to diseases classified elsewhere 01/23/2021   Cognitive dysfunction 01/23/2021   Scrotal  edema 01/23/2021   Elevated LDL cholesterol level 01/23/2021   History of stroke with residual deficit 01/23/2021   Primary insomnia 01/23/2021   Mouth pain 01/23/2021   Laceration of right hand without foreign body    Closed compression fracture of L1 vertebra ([place]) 05/24/2016   Lumbar stenosis with neurogenic claudication 11/01/2015   Chronic 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 11/08/2014   Hx of hemorrhoids 11/08/2014   AAA (abdominal aortic aneurysm) ([place]) 09/22/2014   Chronic pain associated with significant psychosocial dysfunction 09/22/2014   Panic disorder 09/22/2014   AB (asthmatic bronchitis) 08/17/2014   Anxiety 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 08/17/2014   Backache 08/17/2014   Lumbosacral spondylosis without myelopathy 08/17/2014   Disorder of male genital organs 08/17/2014   Brash 08/17/2014   Low back pain 08/17/2014   Tendon nodule 08/17/2014   Episodic paroxysmal anxiety disorder 08/17/2014   Hernia, inguinal, right 08/17/2014   Fast heart beat 08/17/2014   Illness 08/17/2014   Inguinal hernia 10/14/2012    REFERRING DIAG: History of stroke with residual deficit, chronic bilateral low back pain with right-sided sciatica, Lumbosacral spondylosis without myelopathy THERAPY DIAG:  Chronic bilateral low back pain without sciatica  Muscle weakness (generalized)  Unsteadiness on feet  Rationale for Evaluation and Treatment Rehabilitation  PERTINENT HISTORY: Pt. is a 51 y.o. who was diagnosed with a CVA  on July 21st, 2022. Pt. completed several weeks of inpatient rehab at [facility]. After returning to home. He was discharged in late August/early September and recieved home health PT. Pt. sustained a fall in december of 2022, and was admitted to the hospital with COVID-19, and Chronic back pain. He reports chronic back pain syndrome since 2012. Since the most recent discharge, Pt. has been residing with the ex-wife until the Pt. is ready to return to  independent living. He did recieve 2 weeks of home health after discharge in December 2022. He is now being referred to outpatient PT to address weakness from stroke and improve fine motor movement. He reports rarely getting numbness/tingling in RLE. PMHx includes: Bilateral LBP with right sided sciatica, Lumbosacral spondylosis myelopathy, closed compression Fx of L1. Has chronic insomnia. osteoarthritis, Panic anxiety syndrome (taking medication) Pt. enjoys cooking, and riding motorcycles. Patient is going to [facility] center for Epidural spinal injections on 06/07/21;  PRECAUTIONS: Fall, Spinal Brace wears daily, approximately 25%, when exercising; semi-rigid lumbar brace  SUBJECTIVE: Pt reports he is excellent. Rates pain 1/10. Has pain patch on his chest. Pt car currently broken down. Using Lyft currently to get to appointments.   PAIN:  Are you having pain? Yes: NPRS scale: 1/10 Pain location: bilat low back Pain description: dull ache; always constant,  Aggravating factors: worse with sitting,  Relieving factors: heat helps temporarily, pain pills/pain patch   TODAY'S TREATMENT 12/28/21  Heat donned to low back while pt performs the following. Pt reports heat feels good to low back. No adverse reaction to treatment. Skin checked prior to application of heat and upon removal of heat and is WNL.   TherEx-  On mat table: LTR 20x Glute bridge  2x20  Side-lying hip abduction 20x each LE. External cue to assist with technique. Medium.  10# donned: SLR 20x each LE. Rates medium-hard  Seated hamstring curls at cable machine: R 7.5-12.5# 3x10 L: 7.5#-17.5# 3x10  NMR: gait belt donned and CGA provided throughout Slow step-up and step-down from 6" step to promote ease with stair navigation and improved balance x multiple reps Sustained heel raise 2x30 sec B      PATIENT EDUCATION:  Education details: exercise technique, body mechanics Person educated: Patient Education method:  Explanation, Demonstration, Tactile cues, and Verbal cues Education comprehension: verbalized understanding, returned demonstration, verbal cues required, tactile cues required, and needs further education   HOME EXERCISE PROGRAM:   No updates on this date pt to continue HEP as previously given     PT Short Term Goals -       PT SHORT TERM GOAL #1   Title Patient will be adherent to HEP at least 3x a week to improve functional strength and balance for better safety at home.    Baseline 4/4: doing them 1-2x a week; 08/20/2021=Patient reports performing his low back stretching and no questions currently HEP. 10/24/21: doing HEP 4x a week   Time 4    Period Weeks    Status Achieved    Target Date 08/21/21      PT SHORT TERM GOAL #2   Title Patient (< 62 years old) will complete five times sit to stand test in < 15 seconds indicating an increased LE strength and improved balance.    Baseline 4/4: 17.85 sec with arms across chest; 08/20/2021= 17.6 sec , 7/5: 12.85 sec   Time 4    Period Weeks    Status Achieved;    Target Date 10/01/21  PT Long Term Goals -       PT LONG TERM GOAL #1   Title Patient will increase six minute walk test distance to >1000 for progression to community ambulator and improve gait ability    Baseline 4/4: 1115 feet , 7/5: 1080 feet;    Time 4    Period Weeks    Status Achieved    Target Date 08/21/21      PT LONG TERM GOAL #2   Title Patient will ascend/descend 8 stairs without rail assist independently without loss of balance to improve ability to get in/out of home.    Baseline 4/4: requires 1 rail assist; 08/20/2021- Patient demonstrated steps without railing using reciprocal steps today. 7/5: ascend/descend 6 steps reciprocally without rail, but required CGA for safety when descending; 7/25: Able to complete without UE support and good balance going up steps, unsafe descending steps without UE support. Required min assist and UE support to  prevent LOB descending.; 8/15: requires UUE support to descend and close CGA due to unsteadiness    Time 4    Period Weeks    Status On-going    Target Date 12/11/2021      PT LONG TERM GOAL #3   Title Patient will increase BLE gross strength to 4+/5 as to improve functional strength for independent gait, increased standing tolerance and increased ADL ability.    Baseline 4/4: not formally assessed; 08/20/2021= 4+/5 with Right hip flex/knee ext/flex; Right hip abd= 4/5, 7/5: 4+/5 hip    Time 12    Period Weeks    Status Achieved;    Target Date 11/12/21      PT LONG TERM GOAL #4   Title Patient will improve FOTO score to >50% to indicate improved functional mobility with less pain with ADLs.    Baseline 4/4: 35%; 08/20/2021= 42% 7/5: 47%; 7:25:recently addressed; 11/30/21: 35   Time 12    Period Weeks    Status Partially Met   Target Date 02/05/2022      PT LONG TERM GOAL #5   Title Patient will report a worst pain of 4/10 in low back over last week to indicate improved tolerance with ADLs.    Baseline 4/4: 7/10; 08/20/2021= 4/10 , 7/5: worst 7/10 in low back; 7/25: 6/10 ; 8/15: pt reports 6/10   Time 12    Period Weeks    Status  On-going   Target Date 02/05/2022      Additional Long Term Goals   Additional Long Term Goals Yes      PT LONG TERM GOAL #6   Title Pt will increase 10MWT by at least 0.13 m/s in order to demonstrate clinically significant improvement in community ambulation    Baseline 08/20/2021=0.94 m/s 7/5: 1.5 m/s   Time 12    Period Weeks    Status Achieved;    Target Date 11/12/21      PT LONG TERM GOAL #7   Title Patient will demonstrate improved static standing balance as seen by ability to single leg stance on right LE > 12 sec consistently for optimal balance on level and unlevel surfaces.    Baseline 08/20/2021= 4 sec SLS on right , 7/5: 4-5 sec inconsistent; 7/25 able to achieve 15 sec after multiple attempts of 4-5 sec holds; 8/15: LLE 15 sec, RLE 8 sec   Time  12    Period Weeks    Status Partially Met   Target Date 02/05/2022  Plan -     Clinical Impression Statement Pt presents with improved pain levels (1/10). Pain does not increase with any interventions this session. He is able to further advance level of resistance used with exercises. While pt shows progress, tasks that require forefoot or SLB are still somewhat difficult. The pt will benefit from further skilled PT to improve strength and balance, and decrease pain levels.   Personal Factors and Comorbidities Comorbidity 3+;Past/Current Experience;Time since onset of injury/illness/exacerbation    Comorbidities PMHx includes: Bilateral LBP with right sided sciatica, Lumbosacral spondylosis myelopathy, closed compression Fx of L1. Has chronic insomnia. osteoarthritis, Panic anxiety syndrome (taking medication)    Examination-Activity Limitations Bend;Carry;Locomotion Level;Sit;Squat;Stairs;Stand;Transfers    Examination-Participation Restrictions Cleaning;Community Activity;Driving;Laundry;Meal Prep;Occupation;Shop;Volunteer;Yard Work    Stability/Clinical Decision Making Stable/Uncomplicated    Rehab Potential Good    PT Frequency 2x / week    PT Duration 12 weeks    PT Treatment/Interventions Cryotherapy;Electrical Stimulation;Moist Heat;Traction;Gait training;Stair training;Functional mobility training;Therapeutic activities;Therapeutic exercise;Balance training;Neuromuscular re-education;Patient/family education;Manual techniques;Passive range of motion;Dry needling;Energy conservation    PT Next Visit Plan  Progress core stabilization, Manual therapy for low back and LE ROM, instruct in balance HEP, continue plan       Consulted and Agree with Plan of Care Patient            11:11 AM, 12/28/21   Zollie Pee, PT 12/28/2021, 11:11 AM  [place], [place], [address] Phone:  [phone]   Fax:  [phone]

## 2021-12-27 NOTE — Assessment & Plan Note (Addendum)
Largely spinal etiology complicated by stroke follows with pain management and neurosurgeon now who manage valium. No longer on additional muscle relaxant. We rx celebrex and gabapentin. Requests gabapentin increase, advised we check cmp first, but could increase to 400 mg tid.  Advised he discuss this with pain management as well  Will check cbc, cmp monitor kidney function/bleed on gaba/celebrex

## 2021-12-27 NOTE — Assessment & Plan Note (Signed)
Follows with PT/OT, has an appt with a 'stroke specialist'

## 2021-12-27 NOTE — Progress Notes (Signed)
I,Sha'taria Tyson,acting as a Neurosurgeon for [employer], PA-C.,have documented all relevant documentation on the behalf of Alfredia Ferguson, PA-C,as directed by  Alfredia Ferguson, PA-C while in the presence of Alfredia Ferguson, PA-C.   Established patient visit   Patient: David Reeves   DOB: June 12, 1970   51 y.o. Male  MRN: [id number] Visit Date: 12/27/2021  Today's healthcare provider: Alfredia Ferguson, PA-C   Cc. Chronic w/u  Subjective    HPI   Hypertension, follow-up  BP Readings from Last 3 Encounters:  12/27/21 115/76  08/07/21 122/79  05/03/21 115/69   Wt Readings from Last 3 Encounters:  12/27/21 211 lb 11.2 oz (96 kg)  08/07/21 213 lb (96.6 kg)  05/03/21 220 lb 14.4 oz (100.2 kg)     He was last seen for hypertension 5 months ago.  BP at that visit was 122/79. Management since that visit includes n/a.  He reports excellent compliance with treatment. He is not having side effects.  He is following a Regular diet. He is exercising. He does not smoke.  Use of agents associated with hypertension: none.   Outside blood pressures are not being checked Symptoms: No chest pain No chest pressure  No palpitations No syncope  No dyspnea No orthopnea  No paroxysmal nocturnal dyspnea No lower extremity edema   Pertinent labs Lab Results  Component Value Date   CHOL 246 (H) 12/14/2020   HDL 43 12/14/2020   LDLCALC 179 (H) 12/14/2020   TRIG 129 12/14/2020   CHOLHDL 5.7 (H) 12/14/2020   Lab Results  Component Value Date   NA 137 04/05/2021   K 3.7 04/05/2021   CREATININE 0.68 04/05/2021   GFRNONAA >60 04/05/2021   GLUCOSE 101 (H) 04/05/2021   TSH 1.130 02/15/2020     The ASCVD Risk score (Arnett DK, et al., 2019) failed to calculate for the following reasons:   The patient has a prior MI or stroke diagnosis  ---------------------------------------------------------------------------------------------------  Follow up for chronic pain   The patient  was last seen for this 5 months ago. Changes made at last visit include .  He reports excellent compliance with treatment. He feels that condition is Improved. He is having side effects.  Pt now follows with new neurosurgeon and pain management specialist. He feels his pain is well controlled, 1-2/10. Follows with PT/OT.  -----------------------------------------------------------------------------------------  Medications: Outpatient Medications Prior to Visit  Medication Sig   acetaminophen (TYLENOL) 500 MG tablet Take 1,000 mg by mouth every 6 (six) hours as needed for mild pain.   aspirin EC 81 MG tablet Take 81 mg by mouth daily. Swallow whole.   celecoxib (CELEBREX) 200 MG capsule TAKE 1 CAPSULE BY MOUTH TWICE A DAY   Cholecalciferol (VITAMIN D3) 2000 units TABS Take by mouth.   diazepam (VALIUM) 5 MG tablet TAKE 1 TABLET (5 MG TOTAL) BY MOUTH EVERY 12 HOURS AS NEEDED FOR MUSCLE SPASM   gabapentin (NEURONTIN) 300 MG capsule Take 300 mg by mouth 3 (three) times daily.   Multiple Vitamin (MULTIVITAMIN) tablet Take 1 tablet by mouth daily.   [DISCONTINUED] amLODipine (NORVASC) 5 MG tablet Take 1 tablet (5 mg total) by mouth daily.   [DISCONTINUED] amLODipine (NORVASC) 5 MG tablet Take 1 tablet by mouth daily.   [DISCONTINUED] atorvastatin (LIPITOR) 80 MG tablet Take 1 tablet (80 mg total) by mouth daily.   [DISCONTINUED] atorvastatin (LIPITOR) 80 MG tablet Take 1 tablet by mouth daily.   [DISCONTINUED] gabapentin (NEURONTIN) 300 MG capsule    [  DISCONTINUED] prazosin (MINIPRESS) 1 MG capsule TAKE 2 CAPSULES (2 MG TOTAL) BY MOUTH AT BEDTIME. DO NOT TAKE WITH AMBIEN.   [DISCONTINUED] tiZANidine (ZANAFLEX) 4 MG tablet Take 1 tablet (4 mg total) by mouth in the morning, at noon, in the evening, and at bedtime.   buprenorphine (BUTRANS) 10 MCG/HR PTWK 1 patch once a week.   [DISCONTINUED] celecoxib (CELEBREX) 200 MG capsule Take by mouth.   [DISCONTINUED] donepezil (ARICEPT) 10 MG tablet  Take 1 tablet (10 mg total) by mouth at bedtime.   [DISCONTINUED] zolpidem (AMBIEN) 10 MG tablet Take 1 tablet (10 mg total) by mouth at bedtime.   No facility-administered medications prior to visit.    Review of Systems  Constitutional:  Negative for fatigue and fever.  Respiratory:  Negative for cough and shortness of breath.   Cardiovascular:  Negative for chest pain, palpitations and leg swelling.  Neurological:  Negative for dizziness and headaches.       Objective    Blood pressure 115/76, height 6\' 1"  (1.854 m), weight 211 lb 11.2 oz (96 kg), SpO2 99 %.   Physical Exam Constitutional:      General: He is awake.     Appearance: He is well-developed.  HENT:     Head: Normocephalic.  Eyes:     Conjunctiva/sclera: Conjunctivae normal.  Cardiovascular:     Rate and Rhythm: Normal rate and regular rhythm.     Heart sounds: Normal heart sounds.  Pulmonary:     Effort: Pulmonary effort is normal.     Breath sounds: Normal breath sounds.  Skin:    General: Skin is warm.  Neurological:     Mental Status: He is alert and oriented to person, place, and time.  Psychiatric:        Attention and Perception: Attention normal.        Mood and Affect: Mood normal.        Speech: Speech normal.        Behavior: Behavior is cooperative.     No results found for any visits on 12/27/21.  Assessment & Plan     Problem List Items Addressed This Visit       Cardiovascular and Mediastinum   Primary hypertension - Primary    Well controlled on current medications Ordered cmp      Relevant Medications   prazosin (MINIPRESS) 1 MG capsule   atorvastatin (LIPITOR) 80 MG tablet   amLODipine (NORVASC) 5 MG tablet   Other Relevant Orders   Comprehensive Metabolic Panel (CMET)     Nervous and Auditory   Cognitive dysfunction    Post stroke manages with Donepezil       Relevant Medications   donepezil (ARICEPT) 10 MG tablet   Other Relevant Orders   CBC w/Diff/Platelet      Other   Chronic pain associated with significant psychosocial dysfunction    Largely spinal etiology complicated by stroke follows with pain management and neurosurgeon now who manage valium. No longer on additional muscle relaxant. We rx celebrex and gabapentin. Requests gabapentin increase, advised we check cmp first, but could increase to 400 mg tid.  Advised he discuss this with pain management as well  Will check cbc, cmp monitor kidney function/bleed on gaba/celebrex      Relevant Medications   buprenorphine (BUTRANS) 10 MCG/HR PTWK   Panic disorder    Controlled with prazosin       Relevant Medications   prazosin (MINIPRESS) 1 MG capsule   Other Relevant  Orders   CBC w/Diff/Platelet   Elevated LDL cholesterol level    Pt manages with lipitor 80 mg will recheck fasting lipids      Relevant Medications   atorvastatin (LIPITOR) 80 MG tablet   Other Relevant Orders   CBC w/Diff/Platelet   Lipid panel   History of stroke with residual deficit    Follows with PT/OT, has an appt with a 'stroke specialist'       Relevant Medications   donepezil (ARICEPT) 10 MG tablet   Other Visit Diagnoses     Need for influenza vaccination       Relevant Orders   Flu Vaccine QUAD 6+ mos PF IM (Fluarix Quad PF) (Completed)        Return in about 6 months (around 06/27/2022) for CPE.      I, Alfredia Ferguson, PA-C have reviewed all documentation for this visit. The documentation on  12/27/2021 for the exam, diagnosis, procedures, and orders are all accurate and complete.  Alfredia Ferguson, PA-C [facility] [address] [place], [place], [address] Office: [phone] Fax: [phone]   [facility]

## 2021-12-27 NOTE — Assessment & Plan Note (Signed)
Pt manages with lipitor 80 mg will recheck fasting lipids

## 2021-12-27 NOTE — Assessment & Plan Note (Signed)
Controlled with prazosin

## 2021-12-28 ENCOUNTER — Encounter: Payer: Medicare HMO | Admitting: Speech Pathology

## 2021-12-28 ENCOUNTER — Ambulatory Visit: Payer: Medicare Other

## 2021-12-28 ENCOUNTER — Other Ambulatory Visit: Payer: Self-pay | Admitting: Physician Assistant

## 2021-12-28 DIAGNOSIS — R2681 Unsteadiness on feet: Secondary | ICD-10-CM | POA: Diagnosis present

## 2021-12-28 DIAGNOSIS — R278 Other lack of coordination: Secondary | ICD-10-CM | POA: Diagnosis present

## 2021-12-28 DIAGNOSIS — Z8673 Personal history of transient ischemic attack (TIA), and cerebral infarction without residual deficits: Secondary | ICD-10-CM | POA: Diagnosis present

## 2021-12-28 DIAGNOSIS — G894 Chronic pain syndrome: Secondary | ICD-10-CM

## 2021-12-28 DIAGNOSIS — G8929 Other chronic pain: Secondary | ICD-10-CM | POA: Diagnosis present

## 2021-12-28 DIAGNOSIS — M6281 Muscle weakness (generalized): Secondary | ICD-10-CM

## 2021-12-28 DIAGNOSIS — R262 Difficulty in walking, not elsewhere classified: Secondary | ICD-10-CM | POA: Diagnosis present

## 2021-12-28 DIAGNOSIS — R482 Apraxia: Secondary | ICD-10-CM | POA: Diagnosis present

## 2021-12-28 DIAGNOSIS — M545 Low back pain, unspecified: Secondary | ICD-10-CM | POA: Diagnosis present

## 2021-12-28 DIAGNOSIS — I6930 Unspecified sequelae of cerebral infarction: Secondary | ICD-10-CM

## 2021-12-28 LAB — COMPREHENSIVE METABOLIC PANEL
ALT: 20 IU/L (ref 0–44)
AST: 19 IU/L (ref 0–40)
Albumin/Globulin Ratio: 1.8 (ref 1.2–2.2)
Albumin: 4.5 g/dL (ref 3.8–4.9)
Alkaline Phosphatase: 60 IU/L (ref 44–121)
BUN/Creatinine Ratio: 11 (ref 9–20)
BUN: 9 mg/dL (ref 6–24)
Bilirubin Total: 0.7 mg/dL (ref 0.0–1.2)
CO2: 24 mmol/L (ref 20–29)
Calcium: 9.6 mg/dL (ref 8.7–10.2)
Chloride: 103 mmol/L (ref 96–106)
Creatinine, Ser: 0.84 mg/dL (ref 0.76–1.27)
Globulin, Total: 2.5 g/dL (ref 1.5–4.5)
Glucose: 97 mg/dL (ref 70–99)
Potassium: 4.8 mmol/L (ref 3.5–5.2)
Sodium: 141 mmol/L (ref 134–144)
Total Protein: 7 g/dL (ref 6.0–8.5)
eGFR: 106 mL/min/{1.73_m2} (ref >59)

## 2021-12-28 LAB — CBC WITH DIFFERENTIAL/PLATELET
Basophils Absolute: 0 10*3/uL (ref 0.0–0.2)
Basos: 1 %
EOS (ABSOLUTE): 0.1 10*3/uL (ref 0.0–0.4)
Eos: 1 %
Hematocrit: 42.2 % (ref 37.5–51.0)
Hemoglobin: 14.6 g/dL (ref 13.0–17.7)
Immature Grans (Abs): 0 10*3/uL (ref 0.0–0.1)
Immature Granulocytes: 0 %
Lymphocytes Absolute: 2 10*3/uL (ref 0.7–3.1)
Lymphs: 39 %
MCH: 32.7 pg (ref 26.6–33.0)
MCHC: 34.6 g/dL (ref 31.5–35.7)
MCV: 95 fL (ref 79–97)
Monocytes Absolute: 0.4 10*3/uL (ref 0.1–0.9)
Monocytes: 9 %
Neutrophils Absolute: 2.5 10*3/uL (ref 1.4–7.0)
Neutrophils: 50 %
Platelets: 314 10*3/uL (ref 150–450)
RBC: 4.46 x10E6/uL (ref 4.14–5.80)
RDW: 11.9 % (ref 11.6–15.4)
WBC: 5.1 10*3/uL (ref 3.4–10.8)

## 2021-12-28 LAB — LIPID PANEL
Chol/HDL Ratio: 2.9 ratio (ref 0.0–5.0)
Cholesterol, Total: 132 mg/dL (ref 100–199)
HDL: 45 mg/dL (ref >39)
LDL Chol Calc (NIH): 70 mg/dL (ref 0–99)
Triglycerides: 87 mg/dL (ref 0–149)
VLDL Cholesterol Cal: 17 mg/dL (ref 5–40)

## 2021-12-28 MED ORDER — GABAPENTIN 400 MG PO CAPS: 400.0000 mg | ORAL_CAPSULE | Freq: Three times a day (TID) | ORAL | 1 refills | Status: DC

## 2021-12-28 NOTE — Therapy (Signed)
OUTPATIENT OCCUPATIONAL THERAPY TREATMENT NOTE   Patient Name: David Reeves MRN: [id number] DOB:02-06-1971, 51 y.o., male Today's Date: 12/28/2021  PCP: [name], PA-C REFERRING PROVIDER: [name], PA-C  END OF SESSION:      OT End of Session - 12/28/21 2059     Visit Number [id number]    Number of Visits 92    Date for OT Re-Evaluation 01/25/22    Authorization Type Progress reporting period starting 12/11/21    OT Start Time 0918    OT Stop Time 1003    OT Time Calculation (min) 45 min    Activity Tolerance Patient tolerated treatment well    Behavior During Therapy [facility] for tasks assessed/performed               Past Medical History:  Diagnosis Date   Back pain 04/22/2012   Bone spur    Bulging disc 04/22/2012   Degenerative disc disease    Osteoarthritis    Panic anxiety syndrome    Stroke ([place]) 10/2020   Taking multiple medications for chronic disease    Past Surgical History:  Procedure Laterality Date   CYSTECTOMY     head   HERNIA REPAIR Left 2006,2014   [facility]   TOE SURGERY Right 2007   Patient Active Problem List   Diagnosis Date Noted   Alterations of sensations following cerebrovascular accident 03/08/2021   Aphasia as late effect of cerebrovascular accident (CVA) 03/08/2021   Muscle spasm 02/22/2021   Acute non-recurrent frontal sinusitis 02/22/2021   Chronic pain syndrome 01/23/2021   Primary hypertension 01/23/2021   Nerve pain 01/23/2021   Erectile dysfunction due to diseases classified elsewhere 01/23/2021   Cognitive dysfunction 01/23/2021   Scrotal edema 01/23/2021   Elevated LDL cholesterol level 01/23/2021   History of stroke with residual deficit 01/23/2021   Primary insomnia 01/23/2021   Mouth pain 01/23/2021   Laceration of right hand without foreign body    Closed compression fracture of L1 vertebra ([place]) 05/24/2016   Lumbar stenosis with neurogenic claudication 11/01/2015   Chronic bilateral low back pain with  right-sided sciatica 11/08/2014   Hx of hemorrhoids 11/08/2014   AAA (abdominal aortic aneurysm) ([place]) 09/22/2014   Chronic pain associated with significant psychosocial dysfunction 09/22/2014   Panic disorder 09/22/2014   AB (asthmatic bronchitis) 08/17/2014   Anxiety 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 08/17/2014   Backache 08/17/2014   Lumbosacral spondylosis without myelopathy 08/17/2014   Disorder of male genital organs 08/17/2014   Brash 08/17/2014   Low back pain 08/17/2014   Tendon nodule 08/17/2014   Episodic paroxysmal anxiety disorder 08/17/2014   Hernia, inguinal, right 08/17/2014   Fast heart beat 08/17/2014   Illness 08/17/2014   Inguinal hernia 10/14/2012    ONSET DATE: 11/09/2020  REFERRING DIAG: CVA  THERAPY DIAG:  Muscle weakness (generalized)  Other lack of coordination  H/O ischemic left MCA stroke  Rationale for Evaluation and Treatment Rehabilitation  PERTINENT HISTORY: Pt. is a 51 y.o. who was diagnosed with a CVA on July 21st, 2022. Pt. completed several weeks of  inpatient rehab at [facility]. After returning to home, Pt. sustained a fall in december of 2022, and was admitted to the hospital with COVI-19, and back pain from the fall. Since the most recent discharge, Pt. has been residing with the ex-wife until the Pt. is ready to return to independent living. Pt. PMHx includes: Bilateral LBP with right sided sciatica, Lumbosacral spondylosis myelopathy, closed compression Fx of L1. Pt. enjoys  cooking, and riding motorcycles.   PRECAUTIONS: Fall  SUBJECTIVE: Pt reports he was sorry to have to miss therapy earlier this week d/t car troubles.    PAIN:  Are you having pain? Yes: NPRS scale: 2/10 Pain location: low back Pain description: aching Aggravating factors: prolonged sitting or lying down Relieving factors: rest, heat, walking   OBJECTIVE:  TODAY'S TREATMENT:  Therapeutic Exercise: Performed AAROM for R thumb radial abd, place and hold for  same, x10 reps each.  Completed R hand active digit abd, active assisted add for minimizing PIP flexion (improved), active assisted thumb opposition to IF, LF, and 3 point pinch x10 each.  Pt requires cues for rest periods, technique, and intermittent passive stretching between sets for reducing tone throughout R wrist and digits.  Neuro re-ed: Facilitation of grasp and release patterns working to remove ball pegs from pegboard using 2 and 3 point grasp patterns and in reverse, with OT placing ball peg into pt's hand using a 3 point pinch pattern for pt to work on sustaining this grasp to place peg into board (3 successful pegs placed).   PATIENT EDUCATION: Education details: HEP progression, grasp/release activities Person educated: Patient Education method: Explanation Education comprehension: verbalized and demonstrated understanding.      OT Short Term Goals -      OT SHORT TERM GOAL #1   Title Pt. wil demonstrtae independence with HEPs    Baseline Eval: Pt. currently does not have one, 10th visit:  changes in HEP ongoing; 20th: instructed in putty exercises, changes ongoing; 40th: continual updates to HEP as pt progresses; 50th: continual updates   Time 6    Period Weeks    Status On-going    Target Date 11/20/21             OT Long Term Goals -       OT LONG TERM GOAL #1   Title Pt. will improve RUE strength by 2 mm grades to assist with ADLs, and IADLs,    Baseline Eval: Right shoulder flexion, abduction 3/5, elbow flexion, extension wrist extension 3+/5, 10th visit: improving with RUE strength by not yet met goal; 20th visit: R shd flex/abd 4-, elbow flex/ext 4+, wrist flex 4-, wrist ext 4+; 40:  R shd flex/abd 4,  elbow flex/ext 4+, wrist flex 4, wrist ext 4+; 50th visit 8/22: R shoulder flex/abd 4/5, elbow flex/ext 5/5, wrist flex 4, wrist ext 4+   Time 12    Period Weeks    Status On-going    Target Date 01/25/22      OT LONG TERM GOAL #2   Title Pt. will improve  right grip by 10 lbs to prepare for firmly holding objects for IADLs.    Baseline Eval: R: 38#, L: 124# (In 3rd dynamometer slot); R 35# in 4th slot, 28# in 3rd slot, 40th: 47 in 4th slot; 50th: 12/11/21: 46# on 3rd slot   Time 12    Period Weeks    Status On-going    Target Date 01/25/22      OT LONG TERM GOAL #3   Title Pt. will improve right lateral pinch strength by 5# to assist with cutting food    Baseline Eval: R: 17, L: 25; 20th: R: 18; 40th: 15# fingers slipping on meter, 50th: 9# fingers slipping   Time 12    Period Weeks    Status On-going    Target Date 01/25/22      OT LONG TERM GOAL #4  Title Pt. will improve Right 3pt pinch by 2# to be able to hold/open items for cooking    Baseline Eval: R: Pt. unable to engage thumb L: 29#; 20th: R unable; 40th:  unable, fingers slipping; 50th:  unable, fingers slipping   Time 12    Period Weeks    Status On-going    Target Date 01/25/22      OT LONG TERM GOAL #5   Title Pt. will improve right hand [facility] skills to be able to independently manipulate buttons, and zippers.    Baseline Eval: Pt. has difficulty managing buttons,a nd zippers. 10th visit:  improving; 20th: unable to engage R hand effectively, performs 1 handed with L; 50th: performs L handed only   Time 12    Period Weeks    Status On-going    Target Date 01/25/22      OT LONG TERM GOAL #6   Title Pt. will independently write his name    Baseline Eval: Pt. is unable to hold a pen; 20th: pt can grip PenAgain with R hand and can make marks on paper with difficulty, but not yet writing name; 40th:  continues to demonstrate difficulty with grasp of pen; 50th 12/11/21: With assist to set up hand on pen again, pt was able to write first name and initial of last name in 1 1/4" tall letters with fair legibility (pt wants to defer this goal, states this is low on his list of priorities for now)   Time 12    Period Weeks    Status Defer   Target Date 01/25/22      OT LONG TERM  GOAL #7   Title Pt. will improve FOTO score by 2 points to reflect funational improvement    Baseline Eval: FOTO score 46 with TR score 52; 20th: 45; 40th: 15;  50th: 46   Time 12    Period Weeks    Status On-going    Target Date 01/25/22             Plan -    Clinical Impression Statement Pt making steady gains with engaging the R hand into daily tasks.  Pt demonstrated ability to grasp water bottle today and successfully sip his drink without a splil using R hand.  Pt also stated that he was able to use R hand to hold his yogurt cup and tilt it as needed while the L hand worked with the spoon.  Pt is showing increased active thumb radial abd, slightly less flexor tone in the hand when performing active and active assisted digit abd/add.  Pt will continue to work towards neuro re-ed of right hand with emphasis on thumb movements to integrate into functional grasping and manipulation skills.    OT Occupational Profile and History Detailed Assessment- Review of Records and additional review of physical, cognitive, psychosocial history related to current functional performance    Occupational performance deficits (Please refer to evaluation for details): ADL's;IADL's;Education    Rehab Potential Good    Clinical Decision Making Several treatment options, min-mod task modification necessary    Comorbidities Affecting Occupational Performance: May have comorbidities impacting occupational performance    Modification or Assistance to Complete Evaluation  Min-Moderate modification of tasks or assist with assess necessary to complete eval    OT Frequency 2x / week    OT Duration 12 weeks    OT Treatment/Interventions Self-care/ADL training;DME and/or AE instruction;Therapeutic exercise;Ultrasound;Neuromuscular education;Therapeutic activities;Energy conservation;Moist Heat;Patient/family education;Splinting;Functional Mobility Training;Paraffin    Plan  Neuro re-ed and therapeutic exercise     Consulted and Agree with Plan of Care Patient             Leta Speller, MS, OTR/L   Darleene Cleaver, OT 12/28/2021, 9:00 PM

## 2021-12-31 NOTE — Therapy (Signed)
[place] MAIN [facility] SERVICES [address] [place], [place], [address] Phone: [phone]   Fax:  [phone]  Patient Details  Name: David Reeves MRN: [id number] Date of Birth: [birth year] Referring Provider:  [name], PA-C  Encounter Date: 01/01/2022   OUTPATIENT PHYSICAL THERAPY TREATMENT NOTE      Patient Name: David Reeves MRN: [id number] DOB:Dec 15, 1970, 51 y.o., male Today's Date: 01/01/2022  PCP: [name], PA-C REFERRING PROVIDER: [name], PA-C   PT End of Session - 01/01/22 1028     Visit Number 23    Number of Visits 58    Date for PT Re-Evaluation 02/05/22    Authorization Type [employer]    Progress Note Due on Visit 42    PT Start Time 1021    PT Stop Time 1057    PT Time Calculation (min) 36 min    Equipment Utilized During Treatment Gait belt    Activity Tolerance Patient tolerated treatment well;No increased pain    Behavior During Therapy [facility] for tasks assessed/performed                      Past Medical History:  Diagnosis Date   Back pain 04/22/2012   Bone spur    Bulging disc 04/22/2012   Degenerative disc disease    Osteoarthritis    Panic anxiety syndrome    Stroke ([place]) 10/2020   Taking multiple medications for chronic disease    Past Surgical History:  Procedure Laterality Date   CYSTECTOMY     head   HERNIA REPAIR Left 2006,2014   [facility]   TOE SURGERY Right 2007   Patient Active Problem List   Diagnosis Date Noted   Alterations of sensations following cerebrovascular accident 03/08/2021   Aphasia as late effect of cerebrovascular accident (CVA) 03/08/2021   Muscle spasm 02/22/2021   Acute non-recurrent frontal sinusitis 02/22/2021   Chronic pain syndrome 01/23/2021   Primary hypertension 01/23/2021   Nerve pain 01/23/2021   Erectile dysfunction due to diseases classified elsewhere 01/23/2021   Cognitive dysfunction 01/23/2021    Scrotal edema 01/23/2021   Elevated LDL cholesterol level 01/23/2021   History of stroke with residual deficit 01/23/2021   Primary insomnia 01/23/2021   Mouth pain 01/23/2021   Laceration of right hand without foreign body    Closed compression fracture of L1 vertebra ([place]) 05/24/2016   Lumbar stenosis with neurogenic claudication 11/01/2015   Chronic bilateral low back pain with right-sided sciatica 11/08/2014   Hx of hemorrhoids 11/08/2014   AAA (abdominal aortic aneurysm) ([place]) 09/22/2014   Chronic pain associated with significant psychosocial dysfunction 09/22/2014   Panic disorder 09/22/2014   AB (asthmatic bronchitis) 08/17/2014   Anxiety 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 08/17/2014   Backache 08/17/2014   Lumbosacral spondylosis without myelopathy 08/17/2014   Disorder of male genital organs 08/17/2014   Brash 08/17/2014   Low back pain 08/17/2014   Tendon nodule 08/17/2014   Episodic paroxysmal anxiety disorder 08/17/2014   Hernia, inguinal, right 08/17/2014   Fast heart beat 08/17/2014   Illness 08/17/2014   Inguinal hernia 10/14/2012    REFERRING DIAG: History of stroke with residual deficit, chronic bilateral low back pain with right-sided sciatica, Lumbosacral spondylosis without myelopathy THERAPY DIAG:  Muscle weakness (generalized)  Chronic bilateral low back pain without sciatica  Rationale for Evaluation and Treatment Rehabilitation  PERTINENT HISTORY: Pt. is a 51 y.o. who was diagnosed with a CVA on July 21st,  2022. Pt. completed several weeks of inpatient rehab at [facility]. After returning to home. He was discharged in late August/early September and recieved home health PT. Pt. sustained a fall in december of 2022, and was admitted to the hospital with COVID-19, and Chronic back pain. He reports chronic back pain syndrome since 2012. Since the most recent discharge, Pt. has been residing with the ex-wife until the Pt. is ready to return to independent  living. He did recieve 2 weeks of home health after discharge in December 2022. He is now being referred to outpatient PT to address weakness from stroke and improve fine motor movement. He reports rarely getting numbness/tingling in RLE. PMHx includes: Bilateral LBP with right sided sciatica, Lumbosacral spondylosis myelopathy, closed compression Fx of L1. Has chronic insomnia. osteoarthritis, Panic anxiety syndrome (taking medication) Pt. enjoys cooking, and riding motorcycles. Patient is going to [facility] for Epidural spinal injections on 06/07/21;  PRECAUTIONS: Fall, Spinal Brace wears daily, approximately 25%, when exercising; semi-rigid lumbar brace  SUBJECTIVE: Pt rates pain as 2/10 currently. He reports they increased his gabapentin.   PAIN:  Are you having pain? Yes: NPRS scale: 2/10 Pain location: bilat low back Pain description: dull ache; always constant,  Aggravating factors: worse with sitting,  Relieving factors: heat helps temporarily, pain pills/pain patch   TODAY'S TREATMENT 01/01/22  Heat donned to low back while pt performs the following. Pt reports heat feels good to low back. No adverse reaction to treatment.   TherEx-  On mat table: LTR 20x  10# donned: SLR 20x each LE. Rates medium-hard March 20x each LE  Glute bridge  on blue stability ball 20x rates easy.  Side-lying hip abduction 2x15 each LE with 2# weights donned. External cue to assist with technique. Continues to rate medium due to challenge with coordination    Seated in chair:  2# weighs donned each LE LAQ 2x15 each LE  STS 10x     PATIENT EDUCATION:  Education details: Pt educated throughout session about proper posture and technique with exercises. Improved exercise technique, movement at target joints, use of target muscles after min to mod verbal, visual, tactile cues.  Person educated: Patient Education method: Explanation, Demonstration, Tactile cues, and Verbal cues Education  comprehension: verbalized understanding, returned demonstration, verbal cues required, tactile cues required, and needs further education   HOME EXERCISE PROGRAM:   No updates on this date pt to continue HEP as previously given     PT Short Term Goals -       PT SHORT TERM GOAL #1   Title Patient will be adherent to HEP at least 3x a week to improve functional strength and balance for better safety at home.    Baseline 4/4: doing them 1-2x a week; 08/20/2021=Patient reports performing his low back stretching and no questions currently HEP. 10/24/21: doing HEP 4x a week   Time 4    Period Weeks    Status Achieved    Target Date 08/21/21      PT SHORT TERM GOAL #2   Title Patient (< 41 years old) will complete five times sit to stand test in < 15 seconds indicating an increased LE strength and improved balance.    Baseline 4/4: 17.85 sec with arms across chest; 08/20/2021= 17.6 sec , 7/5: 12.85 sec   Time 4    Period Weeks    Status Achieved;    Target Date 10/01/21  PT Long Term Goals -       PT LONG TERM GOAL #1   Title Patient will increase six minute walk test distance to >1000 for progression to community ambulator and improve gait ability    Baseline 4/4: 1115 feet , 7/5: 1080 feet;    Time 4    Period Weeks    Status Achieved    Target Date 08/21/21      PT LONG TERM GOAL #2   Title Patient will ascend/descend 8 stairs without rail assist independently without loss of balance to improve ability to get in/out of home.    Baseline 4/4: requires 1 rail assist; 08/20/2021- Patient demonstrated steps without railing using reciprocal steps today. 7/5: ascend/descend 6 steps reciprocally without rail, but required CGA for safety when descending; 7/25: Able to complete without UE support and good balance going up steps, unsafe descending steps without UE support. Required min assist and UE support to prevent LOB descending.; 8/15: requires UUE support to descend and  close CGA due to unsteadiness    Time 4    Period Weeks    Status On-going    Target Date 12/11/2021      PT LONG TERM GOAL #3   Title Patient will increase BLE gross strength to 4+/5 as to improve functional strength for independent gait, increased standing tolerance and increased ADL ability.    Baseline 4/4: not formally assessed; 08/20/2021= 4+/5 with Right hip flex/knee ext/flex; Right hip abd= 4/5, 7/5: 4+/5 hip    Time 12    Period Weeks    Status Achieved;    Target Date 11/12/21      PT LONG TERM GOAL #4   Title Patient will improve FOTO score to >50% to indicate improved functional mobility with less pain with ADLs.    Baseline 4/4: 35%; 08/20/2021= 42% 7/5: 47%; 7:25:recently addressed; 11/30/21: 35   Time 12    Period Weeks    Status Partially Met   Target Date 02/05/2022      PT LONG TERM GOAL #5   Title Patient will report a worst pain of 4/10 in low back over last week to indicate improved tolerance with ADLs.    Baseline 4/4: 7/10; 08/20/2021= 4/10 , 7/5: worst 7/10 in low back; 7/25: 6/10 ; 8/15: pt reports 6/10   Time 12    Period Weeks    Status  On-going   Target Date 02/05/2022      Additional Long Term Goals   Additional Long Term Goals Yes      PT LONG TERM GOAL #6   Title Pt will increase 10MWT by at least 0.13 m/s in order to demonstrate clinically significant improvement in community ambulation    Baseline 08/20/2021=0.94 m/s 7/5: 1.5 m/s   Time 12    Period Weeks    Status Achieved;    Target Date 11/12/21      PT LONG TERM GOAL #7   Title Patient will demonstrate improved static standing balance as seen by ability to single leg stance on right LE > 12 sec consistently for optimal balance on level and unlevel surfaces.    Baseline 08/20/2021= 4 sec SLS on right , 7/5: 4-5 sec inconsistent; 7/25 able to achieve 15 sec after multiple attempts of 4-5 sec holds; 8/15: LLE 15 sec, RLE 8 sec   Time 12    Period Weeks    Status Partially Met   Target Date  02/05/2022  Plan -     Clinical Impression Statement Patient continues to maintain general lower pain levels.  He is able to increase the number of reps performed with majority of interventions, endurance.  While he shows progress, coordinating right knee extension and hip abduction. The pt will benefit from further skilled PT to improve strength and balance, and decrease pain levels.   Personal Factors and Comorbidities Comorbidity 3+;Past/Current Experience;Time since onset of injury/illness/exacerbation    Comorbidities PMHx includes: Bilateral LBP with right sided sciatica, Lumbosacral spondylosis myelopathy, closed compression Fx of L1. Has chronic insomnia. osteoarthritis, Panic anxiety syndrome (taking medication)    Examination-Activity Limitations Bend;Carry;Locomotion Level;Sit;Squat;Stairs;Stand;Transfers    Examination-Participation Restrictions Cleaning;Community Activity;Driving;Laundry;Meal Prep;Occupation;Shop;Volunteer;Yard Work    Stability/Clinical Decision Making Stable/Uncomplicated    Rehab Potential Good    PT Frequency 2x / week    PT Duration 12 weeks    PT Treatment/Interventions Cryotherapy;Electrical Stimulation;Moist Heat;Traction;Gait training;Stair training;Functional mobility training;Therapeutic activities;Therapeutic exercise;Balance training;Neuromuscular re-education;Patient/family education;Manual techniques;Passive range of motion;Dry needling;Energy conservation    PT Next Visit Plan  Progress core stabilization, Manual therapy for low back and LE ROM, instruct in balance HEP, continue plan       Consulted and Agree with Plan of Care Patient            Note: Portions of this document were prepared using Dragon voice recognition software and although reviewed may contain unintentional dictation errors in syntax, grammar, or spelling.   11:09 AM, 01/01/22   Zollie Pee, PT 01/01/2022, 11:09 AM  [place] MAIN [facility] SERVICES [address] [place], [place], [address] Phone: [phone]   Fax:  [phone]

## 2022-01-01 ENCOUNTER — Ambulatory Visit: Payer: Medicare Other

## 2022-01-01 ENCOUNTER — Encounter: Payer: Medicare HMO | Admitting: Speech Pathology

## 2022-01-01 DIAGNOSIS — M545 Low back pain, unspecified: Secondary | ICD-10-CM

## 2022-01-01 DIAGNOSIS — Z8673 Personal history of transient ischemic attack (TIA), and cerebral infarction without residual deficits: Secondary | ICD-10-CM

## 2022-01-01 DIAGNOSIS — M6281 Muscle weakness (generalized): Secondary | ICD-10-CM

## 2022-01-01 DIAGNOSIS — R278 Other lack of coordination: Secondary | ICD-10-CM

## 2022-01-01 NOTE — Therapy (Signed)
OUTPATIENT OCCUPATIONAL THERAPY TREATMENT NOTE   Patient Name: David Reeves MRN: [id number] DOB:28-Apr-1970, 51 y.o., male Today's Date: 01/01/2022  PCP: [name], PA-C REFERRING PROVIDER: [name], PA-C  END OF SESSION:      OT End of Session - 01/01/22 1618     Visit Number 80    Number of Visits 42    Date for OT Re-Evaluation 01/25/22    Authorization Type Progress reporting period starting 12/11/21    OT Start Time 0920    OT Stop Time 1005    OT Time Calculation (min) 45 min    Activity Tolerance Patient tolerated treatment well    Behavior During Therapy [facility] for tasks assessed/performed               Past Medical History:  Diagnosis Date   Back pain 04/22/2012   Bone spur    Bulging disc 04/22/2012   Degenerative disc disease    Osteoarthritis    Panic anxiety syndrome    Stroke ([place]) 10/2020   Taking multiple medications for chronic disease    Past Surgical History:  Procedure Laterality Date   CYSTECTOMY     head   HERNIA REPAIR Left 2006,2014   [facility]   TOE SURGERY Right 2007   Patient Active Problem List   Diagnosis Date Noted   Alterations of sensations following cerebrovascular accident 03/08/2021   Aphasia as late effect of cerebrovascular accident (CVA) 03/08/2021   Muscle spasm 02/22/2021   Acute non-recurrent frontal sinusitis 02/22/2021   Chronic pain syndrome 01/23/2021   Primary hypertension 01/23/2021   Nerve pain 01/23/2021   Erectile dysfunction due to diseases classified elsewhere 01/23/2021   Cognitive dysfunction 01/23/2021   Scrotal edema 01/23/2021   Elevated LDL cholesterol level 01/23/2021   History of stroke with residual deficit 01/23/2021   Primary insomnia 01/23/2021   Mouth pain 01/23/2021   Laceration of right hand without foreign body    Closed compression fracture of L1 vertebra ([place]) 05/24/2016   Lumbar stenosis with neurogenic claudication 11/01/2015   Chronic bilateral low back pain with  right-sided sciatica 11/08/2014   Hx of hemorrhoids 11/08/2014   AAA (abdominal aortic aneurysm) ([place]) 09/22/2014   Chronic pain associated with significant psychosocial dysfunction 09/22/2014   Panic disorder 09/22/2014   AB (asthmatic bronchitis) 08/17/2014   Anxiety 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 08/17/2014   Backache 08/17/2014   Lumbosacral spondylosis without myelopathy 08/17/2014   Disorder of male genital organs 08/17/2014   Brash 08/17/2014   Low back pain 08/17/2014   Tendon nodule 08/17/2014   Episodic paroxysmal anxiety disorder 08/17/2014   Hernia, inguinal, right 08/17/2014   Fast heart beat 08/17/2014   Illness 08/17/2014   Inguinal hernia 10/14/2012    ONSET DATE: 11/09/2020  REFERRING DIAG: CVA  THERAPY DIAG:  Muscle weakness (generalized)  Other lack of coordination  H/O ischemic left MCA stroke  Rationale for Evaluation and Treatment Rehabilitation  PERTINENT HISTORY: Pt. is a 51 y.o. who was diagnosed with a CVA on July 21st, 2022. Pt. completed several weeks of  inpatient rehab at [facility]. After returning to home, Pt. sustained a fall in december of 2022, and was admitted to the hospital with COVI-19, and back pain from the fall. Since the most recent discharge, Pt. has been residing with the ex-wife until the Pt. is ready to return to independent living. Pt. PMHx includes: Bilateral LBP with right sided sciatica, Lumbosacral spondylosis myelopathy, closed compression Fx of L1. Pt. enjoys  cooking, and riding motorcycles.   PRECAUTIONS: Fall  SUBJECTIVE: Pt reports that he's doing well today.      PAIN:  Are you having pain? Yes: NPRS scale: 2/10 Pain location: low back Pain description: aching Aggravating factors: prolonged sitting or lying down Relieving factors: rest, heat, walking   OBJECTIVE:  TODAY'S TREATMENT:  Therapeutic Exercise: Performed AAROM for R thumb radial abd, place and hold for same, active assisted palmar abd x2 sets 10  reps each.  Completed R hand active digit abd, active assisted add for minimizing PIP flexion (improved).  Completed resistive lateral pinch (using foam) and 3 point pinch x10 each, but pt required hand over hand to maintain these pinch patterns without fingers slipping.  Pt requires cues for rest periods, technique, and intermittent passive stretching between sets for reducing tone throughout R wrist and digits.    Neuro re-ed: Facilitation of grasp and release patterns working to pick up 1" blocks from table top and placing onto neighboring towel (used only as a target for placement).  Pt then practiced moving blocks from towel to velcro checker board.  Pt quickly fatigued so transitioned to a grosser grasp using a palm sized ball.  Pt practiced picking up balls from table top and moving them to towel, and practiced maintaining grasp of ball during wrist flexion/ext with good ability to perform without dropping.  PATIENT EDUCATION: Education details: HEP progression, grasp/release activities Person educated: Patient Education method: Explanation Education comprehension: verbalized and demonstrated understanding.      OT Short Term Goals -      OT SHORT TERM GOAL #1   Title Pt. wil demonstrtae independence with HEPs    Baseline Eval: Pt. currently does not have one, 10th visit:  changes in HEP ongoing; 20th: instructed in putty exercises, changes ongoing; 40th: continual updates to HEP as pt progresses; 50th: continual updates   Time 6    Period Weeks    Status On-going    Target Date 11/20/21             OT Long Term Goals -       OT LONG TERM GOAL #1   Title Pt. will improve RUE strength by 2 mm grades to assist with ADLs, and IADLs,    Baseline Eval: Right shoulder flexion, abduction 3/5, elbow flexion, extension wrist extension 3+/5, 10th visit: improving with RUE strength by not yet met goal; 20th visit: R shd flex/abd 4-, elbow flex/ext 4+, wrist flex 4-, wrist ext 4+; 40:  R  shd flex/abd 4,  elbow flex/ext 4+, wrist flex 4, wrist ext 4+; 50th visit 8/22: R shoulder flex/abd 4/5, elbow flex/ext 5/5, wrist flex 4, wrist ext 4+   Time 12    Period Weeks    Status On-going    Target Date 01/25/22      OT LONG TERM GOAL #2   Title Pt. will improve right grip by 10 lbs to prepare for firmly holding objects for IADLs.    Baseline Eval: R: 38#, L: 124# (In 3rd dynamometer slot); R 35# in 4th slot, 28# in 3rd slot, 40th: 47 in 4th slot; 50th: 12/11/21: 46# on 3rd slot   Time 12    Period Weeks    Status On-going    Target Date 01/25/22      OT LONG TERM GOAL #3   Title Pt. will improve right lateral pinch strength by 5# to assist with cutting food    Baseline Eval: R: 17, L: 25;  20th: R: 18; 40th: 15# fingers slipping on meter, 50th: 9# fingers slipping   Time 12    Period Weeks    Status On-going    Target Date 01/25/22      OT LONG TERM GOAL #4   Title Pt. will improve Right 3pt pinch by 2# to be able to hold/open items for cooking    Baseline Eval: R: Pt. unable to engage thumb L: 29#; 20th: R unable; 40th:  unable, fingers slipping; 50th:  unable, fingers slipping   Time 12    Period Weeks    Status On-going    Target Date 01/25/22      OT LONG TERM GOAL #5   Title Pt. will improve right hand [facility] skills to be able to independently manipulate buttons, and zippers.    Baseline Eval: Pt. has difficulty managing buttons,a nd zippers. 10th visit:  improving; 20th: unable to engage R hand effectively, performs 1 handed with L; 50th: performs L handed only   Time 12    Period Weeks    Status On-going    Target Date 01/25/22      OT LONG TERM GOAL #6   Title Pt. will independently write his name    Baseline Eval: Pt. is unable to hold a pen; 20th: pt can grip PenAgain with R hand and can make marks on paper with difficulty, but not yet writing name; 40th:  continues to demonstrate difficulty with grasp of pen; 50th 12/11/21: With assist to set up hand on pen  again, pt was able to write first name and initial of last name in 1 1/4" tall letters with fair legibility (pt wants to defer this goal, states this is low on his list of priorities for now)   Time 12    Period Weeks    Status Defer   Target Date 01/25/22      OT LONG TERM GOAL #7   Title Pt. will improve FOTO score by 2 points to reflect funational improvement    Baseline Eval: FOTO score 46 with TR score 52; 20th: 45; 40th: 12;  50th: 46   Time 12    Period Weeks    Status On-going    Target Date 01/25/22             Plan -    Clinical Impression Statement Pt continues to require frequent rest breaks when engaging R hand into repeated grasp and pinching patterns, though pt is showing improvements with active thumb radial abd and palmar abd allowing improved ability to grasp/release small items from table top.  Pt will continue to work towards neuro re-ed of right hand with emphasis on thumb movements to integrate into functional grasping and manipulation skills.    OT Occupational Profile and History Detailed Assessment- Review of Records and additional review of physical, cognitive, psychosocial history related to current functional performance    Occupational performance deficits (Please refer to evaluation for details): ADL's;IADL's;Education    Rehab Potential Good    Clinical Decision Making Several treatment options, min-mod task modification necessary    Comorbidities Affecting Occupational Performance: May have comorbidities impacting occupational performance    Modification or Assistance to Complete Evaluation  Min-Moderate modification of tasks or assist with assess necessary to complete eval    OT Frequency 2x / week    OT Duration 12 weeks    OT Treatment/Interventions Self-care/ADL training;DME and/or AE instruction;Therapeutic exercise;Ultrasound;Neuromuscular education;Therapeutic activities;Energy conservation;Moist Heat;Patient/family education;Splinting;Functional  Mobility Training;Paraffin    Plan  Neuro re-ed and therapeutic exercise    Consulted and Agree with Plan of Care Patient             Leta Speller, MS, OTR/L   Darleene Cleaver, OT 01/01/2022, 4:25 PM

## 2022-01-04 ENCOUNTER — Ambulatory Visit: Payer: Medicare Other

## 2022-01-04 ENCOUNTER — Encounter: Payer: Medicare HMO | Admitting: Speech Pathology

## 2022-01-04 DIAGNOSIS — R278 Other lack of coordination: Secondary | ICD-10-CM

## 2022-01-04 DIAGNOSIS — G8929 Other chronic pain: Secondary | ICD-10-CM

## 2022-01-04 DIAGNOSIS — M6281 Muscle weakness (generalized): Secondary | ICD-10-CM

## 2022-01-04 DIAGNOSIS — Z8673 Personal history of transient ischemic attack (TIA), and cerebral infarction without residual deficits: Secondary | ICD-10-CM

## 2022-01-04 DIAGNOSIS — R482 Apraxia: Secondary | ICD-10-CM

## 2022-01-04 NOTE — Therapy (Signed)
[place] MAIN [facility] SERVICES [address] [place], [place], [address] Phone: [phone]   Fax:  [phone]  Patient Details  Name: David Reeves MRN: [id number] Date of Birth: Jul 11, 1970 Referring Provider:  [name], PA-C  Encounter Date: 01/04/2022   OUTPATIENT PHYSICAL THERAPY TREATMENT NOTE      Patient Name: David Reeves MRN: [id number] DOB:[birth year], 51 y.o., male Today's Date: 01/04/2022  PCP: [name], PA-C REFERRING PROVIDER: [name], PA-C   PT End of Session - 01/04/22 1003     Visit Number 47    Number of Visits 89    Date for PT Re-Evaluation 02/05/22    Authorization Type [employer]    Progress Note Due on Visit 40    PT Start Time 1000    PT Stop Time 1051    PT Time Calculation (min) 51 min    Equipment Utilized During Treatment Gait belt    Activity Tolerance Patient tolerated treatment well;No increased pain    Behavior During Therapy [facility] [place] for tasks assessed/performed                      Past Medical History:  Diagnosis Date   Back pain 04/22/2012   Bone spur    Bulging disc 04/22/2012   Degenerative disc disease    Osteoarthritis    Panic anxiety syndrome    Stroke ([place]) 10/2020   Taking multiple medications for chronic disease    Past Surgical History:  Procedure Laterality Date   CYSTECTOMY     head   HERNIA REPAIR Left 2006,2014   [facility]   TOE SURGERY Right 2007   Patient Active Problem List   Diagnosis Date Noted   Alterations of sensations following cerebrovascular accident 03/08/2021   Aphasia as late effect of cerebrovascular accident (CVA) 03/08/2021   Muscle spasm 02/22/2021   Acute non-recurrent frontal sinusitis 02/22/2021   Chronic pain syndrome 01/23/2021   Primary hypertension 01/23/2021   Nerve pain 01/23/2021   Erectile dysfunction due to diseases classified elsewhere 01/23/2021   Cognitive dysfunction 01/23/2021    Scrotal edema 01/23/2021   Elevated LDL cholesterol level 01/23/2021   History of stroke with residual deficit 01/23/2021   Primary insomnia 01/23/2021   Mouth pain 01/23/2021   Laceration of right hand without foreign body    Closed compression fracture of L1 vertebra ([place]) 05/24/2016   Lumbar stenosis with neurogenic claudication 11/01/2015   Chronic bilateral low back pain with right-sided sciatica 11/08/2014   Hx of hemorrhoids 11/08/2014   AAA (abdominal aortic aneurysm) ([place]) 09/22/2014   Chronic pain associated with significant psychosocial dysfunction 09/22/2014   Panic disorder 09/22/2014   AB (asthmatic bronchitis) 08/17/2014   Anxiety 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 08/17/2014   Backache 08/17/2014   Lumbosacral spondylosis without myelopathy 08/17/2014   Disorder of male genital organs 08/17/2014   Brash 08/17/2014   Low back pain 08/17/2014   Tendon nodule 08/17/2014   Episodic paroxysmal anxiety disorder 08/17/2014   Hernia, inguinal, right 08/17/2014   Fast heart beat 08/17/2014   Illness 08/17/2014   Inguinal hernia 10/14/2012    REFERRING DIAG: History of stroke with residual deficit, chronic bilateral low back pain with right-sided sciatica, Lumbosacral spondylosis without myelopathy THERAPY DIAG:  Chronic bilateral low back pain without sciatica  Muscle weakness (generalized)  Other lack of coordination  Rationale for Evaluation and Treatment Rehabilitation  PERTINENT HISTORY: Pt. is a 51 y.o. who was diagnosed with  a CVA on July 21st, 2022. Pt. completed several weeks of inpatient rehab at [facility]. After returning to home. He was discharged in late August/early September and recieved home health PT. Pt. sustained a fall in december of 2022, and was admitted to the hospital with COVID-19, and Chronic back pain. He reports chronic back pain syndrome since 2012. Since the most recent discharge, Pt. has been residing with the ex-wife until the Pt. is ready  to return to independent living. He did recieve 2 weeks of home health after discharge in December 2022. He is now being referred to outpatient PT to address weakness from stroke and improve fine motor movement. He reports rarely getting numbness/tingling in RLE. PMHx includes: Bilateral LBP with right sided sciatica, Lumbosacral spondylosis myelopathy, closed compression Fx of L1. Has chronic insomnia. osteoarthritis, Panic anxiety syndrome (taking medication) Pt. enjoys cooking, and riding motorcycles. Patient is going to [facility] for Epidural spinal injections on 06/07/21;  PRECAUTIONS: Fall, Spinal Brace wears daily, approximately 25%, when exercising; semi-rigid lumbar brace  SUBJECTIVE: Pt presents with higher levels of pain today, rates pain as 3/10 currently. His pain patches were not in stock this morning. He reports he will be able to get them later today. He reports his balance has been good.  PAIN:  Are you having pain? Yes: NPRS scale: 3/10 Pain location: bilat low back Pain description: dull ache; always constant,  Aggravating factors: worse with sitting,  Relieving factors: heat helps temporarily, pain pills/pain patch   TODAY'S TREATMENT 01/04/22  Heat donned to low back while pt performs the following. Pt reports heat feels good to low back. No adverse reaction to treatment.  TherEx-  On mat table: LTR 20x  7.5# donned: Alt LE march 2x20 each LE. Exhibits improved technique. SLR 15x each LE - pain limited today.  Hamstring curl on red stability ball 20x rates easy. Glute bridge on red stability ball with hamstring curl 20x.  Hooklye adductor squeeze with p.ball 20x with 2-3 sec holds  Cat-Cow x multiple reps. VC/TC for technique, most difficulty moving at lumbar spine, remains mostly in ext. Child's pose 2x30 sec ; pt able to achieve more lumbar flexion  Open book 10x each side Side-lying hip abduction 15x each LE    Matrix Cable Machine:   Hamstring curls  3 sets each LE from 7.5-17.5# of 10-11 reps    Leg press:   25# BLE 10x   40# BLE 10x       PATIENT EDUCATION:  Education details: Pt educated throughout session about proper posture and technique with exercises. Improved exercise technique, movement at target joints, use of target muscles after min to mod verbal, visual, tactile cues.  Person educated: Patient Education method: Explanation, Demonstration, Tactile cues, and Verbal cues Education comprehension: verbalized understanding, returned demonstration, verbal cues required, tactile cues required, and needs further education   HOME EXERCISE PROGRAM:   No updates on this date pt to continue HEP as previously given     PT Short Term Goals -       PT SHORT TERM GOAL #1   Title Patient will be adherent to HEP at least 3x a week to improve functional strength and balance for better safety at home.    Baseline 4/4: doing them 1-2x a week; 08/20/2021=Patient reports performing his low back stretching and no questions currently HEP. 10/24/21: doing HEP 4x a week   Time 4    Period Weeks    Status Achieved  Target Date 08/21/21      PT SHORT TERM GOAL #2   Title Patient (< 24 years old) will complete five times sit to stand test in < 15 seconds indicating an increased LE strength and improved balance.    Baseline 4/4: 17.85 sec with arms across chest; 08/20/2021= 17.6 sec , 7/5: 12.85 sec   Time 4    Period Weeks    Status Achieved;    Target Date 10/01/21              PT Long Term Goals -       PT LONG TERM GOAL #1   Title Patient will increase six minute walk test distance to >1000 for progression to community ambulator and improve gait ability    Baseline 4/4: 1115 feet , 7/5: 1080 feet;    Time 4    Period Weeks    Status Achieved    Target Date 08/21/21      PT LONG TERM GOAL #2   Title Patient will ascend/descend 8 stairs without rail assist independently without loss of balance to improve ability to get  in/out of home.    Baseline 4/4: requires 1 rail assist; 08/20/2021- Patient demonstrated steps without railing using reciprocal steps today. 7/5: ascend/descend 6 steps reciprocally without rail, but required CGA for safety when descending; 7/25: Able to complete without UE support and good balance going up steps, unsafe descending steps without UE support. Required min assist and UE support to prevent LOB descending.; 8/15: requires UUE support to descend and close CGA due to unsteadiness    Time 4    Period Weeks    Status On-going    Target Date 12/11/2021      PT LONG TERM GOAL #3   Title Patient will increase BLE gross strength to 4+/5 as to improve functional strength for independent gait, increased standing tolerance and increased ADL ability.    Baseline 4/4: not formally assessed; 08/20/2021= 4+/5 with Right hip flex/knee ext/flex; Right hip abd= 4/5, 7/5: 4+/5 hip    Time 12    Period Weeks    Status Achieved;    Target Date 11/12/21      PT LONG TERM GOAL #4   Title Patient will improve FOTO score to >50% to indicate improved functional mobility with less pain with ADLs.    Baseline 4/4: 35%; 08/20/2021= 42% 7/5: 47%; 7:25:recently addressed; 11/30/21: 35   Time 12    Period Weeks    Status Partially Met   Target Date 02/05/2022      PT LONG TERM GOAL #5   Title Patient will report a worst pain of 4/10 in low back over last week to indicate improved tolerance with ADLs.    Baseline 4/4: 7/10; 08/20/2021= 4/10 , 7/5: worst 7/10 in low back; 7/25: 6/10 ; 8/15: pt reports 6/10   Time 12    Period Weeks    Status  On-going   Target Date 02/05/2022      Additional Long Term Goals   Additional Long Term Goals Yes      PT LONG TERM GOAL #6   Title Pt will increase 10MWT by at least 0.13 m/s in order to demonstrate clinically significant improvement in community ambulation    Baseline 08/20/2021=0.94 m/s 7/5: 1.5 m/s   Time 12    Period Weeks    Status Achieved;    Target Date  11/12/21      PT LONG TERM GOAL #7  Title Patient will demonstrate improved static standing balance as seen by ability to single leg stance on right LE > 12 sec consistently for optimal balance on level and unlevel surfaces.    Baseline 08/20/2021= 4 sec SLS on right , 7/5: 4-5 sec inconsistent; 7/25 able to achieve 15 sec after multiple attempts of 4-5 sec holds; 8/15: LLE 15 sec, RLE 8 sec   Time 12    Period Weeks    Status Partially Met   Target Date 02/05/2022              Plan -     Clinical Impression Statement Weights decreased today due to pt presenting with increased LBP (does not currently have his pain patches). He tolerated lower-resistance interventions well in supine. Pt was, however, despite increased pain today able to progress to heavier weights with cable machine hamstring curls and on leg press. This indicates improved BLE strength. The pt will benefit from further skilled PT to improve strength and balance, and decrease pain levels.   Personal Factors and Comorbidities Comorbidity 3+;Past/Current Experience;Time since onset of injury/illness/exacerbation    Comorbidities PMHx includes: Bilateral LBP with right sided sciatica, Lumbosacral spondylosis myelopathy, closed compression Fx of L1. Has chronic insomnia. osteoarthritis, Panic anxiety syndrome (taking medication)    Examination-Activity Limitations Bend;Carry;Locomotion Level;Sit;Squat;Stairs;Stand;Transfers    Examination-Participation Restrictions Cleaning;Community Activity;Driving;Laundry;Meal Prep;Occupation;Shop;Volunteer;Yard Work    Stability/Clinical Decision Making Stable/Uncomplicated    Rehab Potential Good    PT Frequency 2x / week    PT Duration 12 weeks    PT Treatment/Interventions Cryotherapy;Electrical Stimulation;Moist Heat;Traction;Gait training;Stair training;Functional mobility training;Therapeutic activities;Therapeutic exercise;Balance training;Neuromuscular re-education;Patient/family  education;Manual techniques;Passive range of motion;Dry needling;Energy conservation    PT Next Visit Plan  Progress core stabilization, Manual therapy for low back and LE ROM, instruct in balance HEP, continue plan       Consulted and Agree with Plan of Care Patient              11:08 AM, 01/04/22   Zollie Pee, PT 01/04/2022, 11:08 AM  [place], [place], [address] Phone: [phone]   Fax:  [phone]

## 2022-01-04 NOTE — Therapy (Signed)
OUTPATIENT OCCUPATIONAL THERAPY TREATMENT NOTE   Patient Name: David Reeves MRN: [id number] DOB:04/22/1971, 51 y.o., male Today's Date: 01/04/2022  PCP: [name], PA-C REFERRING PROVIDER: [name], PA-C  END OF SESSION:      OT End of Session - 01/04/22 0922     Visit Number 63    Number of Visits 68    Date for OT Re-Evaluation 01/25/22    Authorization Type Progress reporting period starting 12/11/21    OT Start Time 0915    OT Stop Time 1000    OT Time Calculation (min) 45 min    Activity Tolerance Patient tolerated treatment well    Behavior During Therapy [facility] for tasks assessed/performed               Past Medical History:  Diagnosis Date   Back pain 04/22/2012   Bone spur    Bulging disc 04/22/2012   Degenerative disc disease    Osteoarthritis    Panic anxiety syndrome    Stroke ([place]) 10/2020   Taking multiple medications for chronic disease    Past Surgical History:  Procedure Laterality Date   CYSTECTOMY     head   HERNIA REPAIR Left 2006,2014   [facility]   TOE SURGERY Right 2007   Patient Active Problem List   Diagnosis Date Noted   Alterations of sensations following cerebrovascular accident 03/08/2021   Aphasia as late effect of cerebrovascular accident (CVA) 03/08/2021   Muscle spasm 02/22/2021   Acute non-recurrent frontal sinusitis 02/22/2021   Chronic pain syndrome 01/23/2021   Primary hypertension 01/23/2021   Nerve pain 01/23/2021   Erectile dysfunction due to diseases classified elsewhere 01/23/2021   Cognitive dysfunction 01/23/2021   Scrotal edema 01/23/2021   Elevated LDL cholesterol level 01/23/2021   History of stroke with residual deficit 01/23/2021   Primary insomnia 01/23/2021   Mouth pain 01/23/2021   Laceration of right hand without foreign body    Closed compression fracture of L1 vertebra ([place]) 05/24/2016   Lumbar stenosis with neurogenic claudication 11/01/2015   Chronic bilateral low back pain with  right-sided sciatica 11/08/2014   Hx of hemorrhoids 11/08/2014   AAA (abdominal aortic aneurysm) ([place]) 09/22/2014   Chronic pain associated with significant psychosocial dysfunction 09/22/2014   Panic disorder 09/22/2014   AB (asthmatic bronchitis) 08/17/2014   Anxiety 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 08/17/2014   Backache 08/17/2014   Lumbosacral spondylosis without myelopathy 08/17/2014   Disorder of male genital organs 08/17/2014   Brash 08/17/2014   Low back pain 08/17/2014   Tendon nodule 08/17/2014   Episodic paroxysmal anxiety disorder 08/17/2014   Hernia, inguinal, right 08/17/2014   Fast heart beat 08/17/2014   Illness 08/17/2014   Inguinal hernia 10/14/2012    ONSET DATE: 11/09/2020  REFERRING DIAG: CVA  THERAPY DIAG:  Muscle weakness (generalized)  Apraxia  H/O ischemic left MCA stroke  H/O ischemic left ACA stroke  Rationale for Evaluation and Treatment Rehabilitation  PERTINENT HISTORY: Pt. is a 51 y.o. who was diagnosed with a CVA on July 21st, 2022. Pt. completed several weeks of  inpatient rehab at [facility]. After returning to home, Pt. sustained a fall in december of 2022, and was admitted to the hospital with COVI-19, and back pain from the fall. Since the most recent discharge, Pt. has been residing with the ex-wife until the Pt. is ready to return to independent living. Pt. PMHx includes: Bilateral LBP with right sided sciatica, Lumbosacral spondylosis myelopathy, closed compression Fx of  L1. Pt. enjoys cooking, and riding motorcycles.   PRECAUTIONS: Fall  SUBJECTIVE: Pt reports his pain patches have not come in and he is hurting today.    PAIN:  Are you having pain? Yes: NPRS scale: 3/10 Pain location: low back Pain description: aching Aggravating factors: prolonged sitting or lying down Relieving factors: rest, heat, walking   OBJECTIVE:  TODAY'S TREATMENT:   Therapeutic Exercise: Performed AAROM for R thumb radial abd and finger isolated  tapping - assist to minimize PIP flexion (improved).  Pt requires cues for rest periods, technique, and intermittent passive stretching between sets for reducing tone throughout R wrist and digits.  R shoulder AAROM performed during rest breaks.   Neuro re-ed: Facilitation of grasp and release patterns working to pick up knob pegs from pegboard and placing into tupperware. Removed 3 pegs from pegboard but difficulty maintaining pinch to pick up. With assist to place thumb and 2nd digit on peg, pt successfully removed 3 knobs and placed in tupperware container. Improved ability to maintain grasp without dropping. Pt worked on grasping 1" resistive cubes from tabletop and placing on velcro board. Requires multiple rest breaks however completes 21 cubes. Unable to firmly press onto velco board resulting in occasionally knocking cubes back off of board and pt expressing frustration.   PATIENT EDUCATION: Education details: HEP progression, grasp/release activities Person educated: Patient Education method: Explanation Education comprehension: verbalized and demonstrated understanding.      OT Short Term Goals -      OT SHORT TERM GOAL #1   Title Pt. wil demonstrtae independence with HEPs    Baseline Eval: Pt. currently does not have one, 10th visit:  changes in HEP ongoing; 20th: instructed in putty exercises, changes ongoing; 40th: continual updates to HEP as pt progresses; 50th: continual updates   Time 6    Period Weeks    Status On-going    Target Date 11/20/21             OT Long Term Goals -       OT LONG TERM GOAL #1   Title Pt. will improve RUE strength by 2 mm grades to assist with ADLs, and IADLs,    Baseline Eval: Right shoulder flexion, abduction 3/5, elbow flexion, extension wrist extension 3+/5, 10th visit: improving with RUE strength by not yet met goal; 20th visit: R shd flex/abd 4-, elbow flex/ext 4+, wrist flex 4-, wrist ext 4+; 40:  R shd flex/abd 4,  elbow flex/ext 4+,  wrist flex 4, wrist ext 4+; 50th visit 8/22: R shoulder flex/abd 4/5, elbow flex/ext 5/5, wrist flex 4, wrist ext 4+   Time 12    Period Weeks    Status On-going    Target Date 01/25/22      OT LONG TERM GOAL #2   Title Pt. will improve right grip by 10 lbs to prepare for firmly holding objects for IADLs.    Baseline Eval: R: 38#, L: 124# (In 3rd dynamometer slot); R 35# in 4th slot, 28# in 3rd slot, 40th: 47 in 4th slot; 50th: 12/11/21: 46# on 3rd slot   Time 12    Period Weeks    Status On-going    Target Date 01/25/22      OT LONG TERM GOAL #3   Title Pt. will improve right lateral pinch strength by 5# to assist with cutting food    Baseline Eval: R: 17, L: 25; 20th: R: 18; 40th: 15# fingers slipping on meter, 50th: 9# fingers slipping  Time 12    Period Weeks    Status On-going    Target Date 01/25/22      OT LONG TERM GOAL #4   Title Pt. will improve Right 3pt pinch by 2# to be able to hold/open items for cooking    Baseline Eval: R: Pt. unable to engage thumb L: 29#; 20th: R unable; 40th:  unable, fingers slipping; 50th:  unable, fingers slipping   Time 12    Period Weeks    Status On-going    Target Date 01/25/22      OT LONG TERM GOAL #5   Title Pt. will improve right hand [facility] skills to be able to independently manipulate buttons, and zippers.    Baseline Eval: Pt. has difficulty managing buttons,a nd zippers. 10th visit:  improving; 20th: unable to engage R hand effectively, performs 1 handed with L; 50th: performs L handed only   Time 12    Period Weeks    Status On-going    Target Date 01/25/22      OT LONG TERM GOAL #6   Title Pt. will independently write his name    Baseline Eval: Pt. is unable to hold a pen; 20th: pt can grip PenAgain with R hand and can make marks on paper with difficulty, but not yet writing name; 40th:  continues to demonstrate difficulty with grasp of pen; 50th 12/11/21: With assist to set up hand on pen again, pt was able to write first name  and initial of last name in 1 1/4" tall letters with fair legibility (pt wants to defer this goal, states this is low on his list of priorities for now)   Time 12    Period Weeks    Status Defer   Target Date 01/25/22      OT LONG TERM GOAL #7   Title Pt. will improve FOTO score by 2 points to reflect funational improvement    Baseline Eval: FOTO score 46 with TR score 52; 20th: 45; 40th: 43;  50th: 46   Time 12    Period Weeks    Status On-going    Target Date 01/25/22             Plan -    Clinical Impression Statement Pt continues to require frequent rest breaks when engaging R hand into repeated grasp and pinching patterns. Pt reports he feels he did worse today than normal, also reports he does not have his pain patch and is hurting 3/10 low back. With repetitions improved accuracy picking 1 inch cubes up and placing on velcro board. Pt will continue to work towards neuro re-ed of right hand with emphasis on thumb movements to integrate into functional grasping and manipulation skills.    OT Occupational Profile and History Detailed Assessment- Review of Records and additional review of physical, cognitive, psychosocial history related to current functional performance    Occupational performance deficits (Please refer to evaluation for details): ADL's;IADL's;Education    Rehab Potential Good    Clinical Decision Making Several treatment options, min-mod task modification necessary    Comorbidities Affecting Occupational Performance: May have comorbidities impacting occupational performance    Modification or Assistance to Complete Evaluation  Min-Moderate modification of tasks or assist with assess necessary to complete eval    OT Frequency 2x / week    OT Duration 12 weeks    OT Treatment/Interventions Self-care/ADL training;DME and/or AE instruction;Therapeutic exercise;Ultrasound;Neuromuscular education;Therapeutic activities;Energy conservation;Moist Heat;Patient/family  education;Splinting;Functional Mobility Training;Paraffin    Plan  Neuro re-ed and therapeutic exercise    Consulted and Agree with Plan of Care Patient            Dessie Coma, M.S. OTR/L  01/04/22, 9:23 AM  ascom [phone]    Leonides Cave, OT 01/04/2022, 9:23 AM

## 2022-01-08 ENCOUNTER — Ambulatory Visit: Payer: Medicare Other

## 2022-01-08 ENCOUNTER — Encounter: Payer: Medicare HMO | Admitting: Speech Pathology

## 2022-01-08 DIAGNOSIS — G8929 Other chronic pain: Secondary | ICD-10-CM

## 2022-01-08 DIAGNOSIS — M6281 Muscle weakness (generalized): Secondary | ICD-10-CM | POA: Diagnosis not present

## 2022-01-08 DIAGNOSIS — R278 Other lack of coordination: Secondary | ICD-10-CM

## 2022-01-08 DIAGNOSIS — Z8673 Personal history of transient ischemic attack (TIA), and cerebral infarction without residual deficits: Secondary | ICD-10-CM

## 2022-01-08 DIAGNOSIS — R262 Difficulty in walking, not elsewhere classified: Secondary | ICD-10-CM

## 2022-01-08 NOTE — Therapy (Signed)
[place] MAIN [facility] SERVICES [address] [place], [place], [address] Phone: [phone]   Fax:  [phone]  Patient Details  Name: David Reeves MRN: [id number] Date of Birth: 08/12/1970 Referring Provider:  [name], PA-C  Encounter Date: 01/08/2022   OUTPATIENT PHYSICAL THERAPY TREATMENT NOTE      Patient Name: David Reeves MRN: [id number] DOB:[birth year], 51 y.o., male Today's Date: 01/08/2022  PCP: [name], PA-C REFERRING PROVIDER: [name], PA-C   PT End of Session - 01/08/22 1708     Visit Number 48    Number of Visits 47    Date for PT Re-Evaluation 02/05/22    Authorization Type [employer]    Progress Note Due on Visit 40    PT Start Time 1019    PT Stop Time 1059    PT Time Calculation (min) 40 min    Equipment Utilized During Treatment Gait belt    Activity Tolerance Patient tolerated treatment well;No increased pain    Behavior During Therapy [facility] for tasks assessed/performed                       Past Medical History:  Diagnosis Date   Back pain 04/22/2012   Bone spur    Bulging disc 04/22/2012   Degenerative disc disease    Osteoarthritis    Panic anxiety syndrome    Stroke ([place]) 10/2020   Taking multiple medications for chronic disease    Past Surgical History:  Procedure Laterality Date   CYSTECTOMY     head   HERNIA REPAIR Left 2006,2014   [facility]   TOE SURGERY Right 2007   Patient Active Problem List   Diagnosis Date Noted   Alterations of sensations following cerebrovascular accident 03/08/2021   Aphasia as late effect of cerebrovascular accident (CVA) 03/08/2021   Muscle spasm 02/22/2021   Acute non-recurrent frontal sinusitis 02/22/2021   Chronic pain syndrome 01/23/2021   Primary hypertension 01/23/2021   Nerve pain 01/23/2021   Erectile dysfunction due to diseases classified elsewhere 01/23/2021   Cognitive dysfunction 01/23/2021    Scrotal edema 01/23/2021   Elevated LDL cholesterol level 01/23/2021   History of stroke with residual deficit 01/23/2021   Primary insomnia 01/23/2021   Mouth pain 01/23/2021   Laceration of right hand without foreign body    Closed compression fracture of L1 vertebra ([place]) 05/24/2016   Lumbar stenosis with neurogenic claudication 11/01/2015   Chronic bilateral low back pain with right-sided sciatica 11/08/2014   Hx of hemorrhoids 11/08/2014   AAA (abdominal aortic aneurysm) ([place]) 09/22/2014   Chronic pain associated with significant psychosocial dysfunction 09/22/2014   Panic disorder 09/22/2014   AB (asthmatic bronchitis) 08/17/2014   Anxiety 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 08/17/2014   Backache 08/17/2014   Lumbosacral spondylosis without myelopathy 08/17/2014   Disorder of male genital organs 08/17/2014   Brash 08/17/2014   Low back pain 08/17/2014   Tendon nodule 08/17/2014   Episodic paroxysmal anxiety disorder 08/17/2014   Hernia, inguinal, right 08/17/2014   Fast heart beat 08/17/2014   Illness 08/17/2014   Inguinal hernia 10/14/2012    REFERRING DIAG: History of stroke with residual deficit, chronic bilateral low back pain with right-sided sciatica, Lumbosacral spondylosis without myelopathy THERAPY DIAG:  Muscle weakness (generalized)  Chronic bilateral low back pain without sciatica  Difficulty in walking, not elsewhere classified  Rationale for Evaluation and Treatment Rehabilitation  PERTINENT HISTORY: Pt. is a 51 y.o. who  was diagnosed with a CVA on July 21st, 2022. Pt. completed several weeks of inpatient rehab at [facility]. After returning to home. He was discharged in late August/early September and recieved home health PT. Pt. sustained a fall in december of 2022, and was admitted to the hospital with COVID-19, and Chronic back pain. He reports chronic back pain syndrome since 2012. Since the most recent discharge, Pt. has been residing with the ex-wife  until the Pt. is ready to return to independent living. He did recieve 2 weeks of home health after discharge in December 2022. He is now being referred to outpatient PT to address weakness from stroke and improve fine motor movement. He reports rarely getting numbness/tingling in RLE. PMHx includes: Bilateral LBP with right sided sciatica, Lumbosacral spondylosis myelopathy, closed compression Fx of L1. Has chronic insomnia. osteoarthritis, Panic anxiety syndrome (taking medication) Pt. enjoys cooking, and riding motorcycles. Patient is going to [facility] spine center for Epidural spinal injections on 06/07/21;  PRECAUTIONS: Fall, Spinal Brace wears daily, approximately 25%, when exercising; semi-rigid lumbar brace  SUBJECTIVE: Pt reports pain currently 1/10. Reports did OK after last appointment. Pt reports he walked up the stairs 2x this morning (25 steps) holding onto hand rail coming down about mid-way.   PAIN:  Are you having pain? Yes: NPRS scale: 1/10 Pain location: bilat low back Pain description: dull ache; always constant,  Aggravating factors: worse with sitting,  Relieving factors: heat helps temporarily, pain pills/pain patch   TODAY'S TREATMENT 01/08/22    TherEx-  Cable machine: Hamstring curls 7.5#-22.5# LLE 10-15 reps per set Hamstring curls 7.5-17.5# RLE 10-15 reps per set. Comments: Easy-medium  Leg press: 40# 10x Easy 55# 10x Easy-medium 70# 10x Easy 100# 10x. Medium  On mat table:  Heat donned to low back while pt performs the following. Pt reports heat feels good to low back. No adverse reaction to treatment. -LTR 20x 10# donned: SLR 15x each LE Alt LE march 2x20 each LE. Exhibits improved technique. Bridges 20x    PATIENT EDUCATION:  Education details: Pt educated throughout session about proper posture and technique with exercises. Improved exercise technique, movement at target joints, use of target muscles after min to mod verbal, visual, tactile  cues.  Person educated: Patient Education method: Explanation, Demonstration, Tactile cues, and Verbal cues Education comprehension: verbalized understanding, returned demonstration, verbal cues required, tactile cues required, and needs further education   HOME EXERCISE PROGRAM:   No updates on this date pt to continue HEP as previously given     PT Short Term Goals -       PT SHORT TERM GOAL #1   Title Patient will be adherent to HEP at least 3x a week to improve functional strength and balance for better safety at home.    Baseline 4/4: doing them 1-2x a week; 08/20/2021=Patient reports performing his low back stretching and no questions currently HEP. 10/24/21: doing HEP 4x a week   Time 4    Period Weeks    Status Achieved    Target Date 08/21/21      PT SHORT TERM GOAL #2   Title Patient (< 84 years old) will complete five times sit to stand test in < 15 seconds indicating an increased LE strength and improved balance.    Baseline 4/4: 17.85 sec with arms across chest; 08/20/2021= 17.6 sec , 7/5: 12.85 sec   Time 4    Period Weeks    Status Achieved;    Target Date 10/01/21  PT Long Term Goals -       PT LONG TERM GOAL #1   Title Patient will increase six minute walk test distance to >1000 for progression to community ambulator and improve gait ability    Baseline 4/4: 1115 feet , 7/5: 1080 feet;    Time 4    Period Weeks    Status Achieved    Target Date 08/21/21      PT LONG TERM GOAL #2   Title Patient will ascend/descend 8 stairs without rail assist independently without loss of balance to improve ability to get in/out of home.    Baseline 4/4: requires 1 rail assist; 08/20/2021- Patient demonstrated steps without railing using reciprocal steps today. 7/5: ascend/descend 6 steps reciprocally without rail, but required CGA for safety when descending; 7/25: Able to complete without UE support and good balance going up steps, unsafe descending steps  without UE support. Required min assist and UE support to prevent LOB descending.; 8/15: requires UUE support to descend and close CGA due to unsteadiness    Time 4    Period Weeks    Status On-going    Target Date 12/11/2021      PT LONG TERM GOAL #3   Title Patient will increase BLE gross strength to 4+/5 as to improve functional strength for independent gait, increased standing tolerance and increased ADL ability.    Baseline 4/4: not formally assessed; 08/20/2021= 4+/5 with Right hip flex/knee ext/flex; Right hip abd= 4/5, 7/5: 4+/5 hip    Time 12    Period Weeks    Status Achieved;    Target Date 11/12/21      PT LONG TERM GOAL #4   Title Patient will improve FOTO score to >50% to indicate improved functional mobility with less pain with ADLs.    Baseline 4/4: 35%; 08/20/2021= 42% 7/5: 47%; 7:25:recently addressed; 11/30/21: 35   Time 12    Period Weeks    Status Partially Met   Target Date 02/05/2022      PT LONG TERM GOAL #5   Title Patient will report a worst pain of 4/10 in low back over last week to indicate improved tolerance with ADLs.    Baseline 4/4: 7/10; 08/20/2021= 4/10 , 7/5: worst 7/10 in low back; 7/25: 6/10 ; 8/15: pt reports 6/10   Time 12    Period Weeks    Status  On-going   Target Date 02/05/2022      Additional Long Term Goals   Additional Long Term Goals Yes      PT LONG TERM GOAL #6   Title Pt will increase 10MWT by at least 0.13 m/s in order to demonstrate clinically significant improvement in community ambulation    Baseline 08/20/2021=0.94 m/s 7/5: 1.5 m/s   Time 12    Period Weeks    Status Achieved;    Target Date 11/12/21      PT LONG TERM GOAL #7   Title Patient will demonstrate improved static standing balance as seen by ability to single leg stance on right LE > 12 sec consistently for optimal balance on level and unlevel surfaces.    Baseline 08/20/2021= 4 sec SLS on right , 7/5: 4-5 sec inconsistent; 7/25 able to achieve 15 sec after multiple  attempts of 4-5 sec holds; 8/15: LLE 15 sec, RLE 8 sec   Time 12    Period Weeks    Status Partially Met   Target Date 02/05/2022  Plan -     Clinical Impression Statement Pt presents with improved LBP today. He shows progress by advancing therex with increased reps and/or level of resistance. While pt shows progress he has not yet met therapy goals and still with functional strength deficits. The pt will benefit from further skilled PT to improve strength and balance, and decrease pain levels.   Personal Factors and Comorbidities Comorbidity 3+;Past/Current Experience;Time since onset of injury/illness/exacerbation    Comorbidities PMHx includes: Bilateral LBP with right sided sciatica, Lumbosacral spondylosis myelopathy, closed compression Fx of L1. Has chronic insomnia. osteoarthritis, Panic anxiety syndrome (taking medication)    Examination-Activity Limitations Bend;Carry;Locomotion Level;Sit;Squat;Stairs;Stand;Transfers    Examination-Participation Restrictions Cleaning;Community Activity;Driving;Laundry;Meal Prep;Occupation;Shop;Volunteer;Yard Work    Stability/Clinical Decision Making Stable/Uncomplicated    Rehab Potential Good    PT Frequency 2x / week    PT Duration 12 weeks    PT Treatment/Interventions Cryotherapy;Electrical Stimulation;Moist Heat;Traction;Gait training;Stair training;Functional mobility training;Therapeutic activities;Therapeutic exercise;Balance training;Neuromuscular re-education;Patient/family education;Manual techniques;Passive range of motion;Dry needling;Energy conservation    PT Next Visit Plan  Progress core stabilization, Manual therapy for low back and LE ROM, instruct in balance HEP, continue plan       Consulted and Agree with Plan of Care Patient              5:11 PM, 01/08/22   Zollie Pee, PT 01/08/2022, 5:11 PM  [facility] [address] [place], [place],  [address] Phone: [phone]   Fax:  [phone]

## 2022-01-09 NOTE — Therapy (Signed)
OUTPATIENT OCCUPATIONAL THERAPY TREATMENT NOTE   Patient Name: David Reeves MRN: [id number] DOB:1971-03-09, 51 y.o., male Today's Date: 01/09/2022  PCP: [name], PA-C REFERRING PROVIDER: [name], PA-C  END OF SESSION:      OT End of Session - 01/09/22 0841     Visit Number 16    Number of Visits 26    Date for OT Re-Evaluation 01/25/22    Authorization Type Progress reporting period starting 12/11/21    OT Start Time 0919    OT Stop Time 1003    OT Time Calculation (min) 44 min    Activity Tolerance Patient tolerated treatment well    Behavior During Therapy [facility] for tasks assessed/performed               Past Medical History:  Diagnosis Date   Back pain 04/22/2012   Bone spur    Bulging disc 04/22/2012   Degenerative disc disease    Osteoarthritis    Panic anxiety syndrome    Stroke ([place]) 10/2020   Taking multiple medications for chronic disease    Past Surgical History:  Procedure Laterality Date   CYSTECTOMY     head   HERNIA REPAIR Left 2006,2014   [facility]   TOE SURGERY Right 2007   Patient Active Problem List   Diagnosis Date Noted   Alterations of sensations following cerebrovascular accident 03/08/2021   Aphasia as late effect of cerebrovascular accident (CVA) 03/08/2021   Muscle spasm 02/22/2021   Acute non-recurrent frontal sinusitis 02/22/2021   Chronic pain syndrome 01/23/2021   Primary hypertension 01/23/2021   Nerve pain 01/23/2021   Erectile dysfunction due to diseases classified elsewhere 01/23/2021   Cognitive dysfunction 01/23/2021   Scrotal edema 01/23/2021   Elevated LDL cholesterol level 01/23/2021   History of stroke with residual deficit 01/23/2021   Primary insomnia 01/23/2021   Mouth pain 01/23/2021   Laceration of right hand without foreign body    Closed compression fracture of L1 vertebra ([place]) 05/24/2016   Lumbar stenosis with neurogenic claudication 11/01/2015   Chronic bilateral low back pain with  right-sided sciatica 11/08/2014   Hx of hemorrhoids 11/08/2014   AAA (abdominal aortic aneurysm) ([place]) 09/22/2014   Chronic pain associated with significant psychosocial dysfunction 09/22/2014   Panic disorder 09/22/2014   AB (asthmatic bronchitis) 08/17/2014   Anxiety 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 08/17/2014   Backache 08/17/2014   Lumbosacral spondylosis without myelopathy 08/17/2014   Disorder of male genital organs 08/17/2014   Brash 08/17/2014   Low back pain 08/17/2014   Tendon nodule 08/17/2014   Episodic paroxysmal anxiety disorder 08/17/2014   Hernia, inguinal, right 08/17/2014   Fast heart beat 08/17/2014   Illness 08/17/2014   Inguinal hernia 10/14/2012    ONSET DATE: 11/09/2020  REFERRING DIAG: CVA  THERAPY DIAG:  Muscle weakness (generalized)  Other lack of coordination  H/O ischemic left MCA stroke  Rationale for Evaluation and Treatment Rehabilitation  PERTINENT HISTORY: Pt. is a 51 y.o. who was diagnosed with a CVA on July 21st, 2022. Pt. completed several weeks of  inpatient rehab at [facility]. After returning to home, Pt. sustained a fall in december of 2022, and was admitted to the hospital with COVI-19, and back pain from the fall. Since the most recent discharge, Pt. has been residing with the ex-wife until the Pt. is ready to return to independent living. Pt. PMHx includes: Bilateral LBP with right sided sciatica, Lumbosacral spondylosis myelopathy, closed compression Fx of L1. Pt. enjoys  cooking, and riding motorcycles.   PRECAUTIONS: Fall  SUBJECTIVE: Pt states he's doing excellent today; pain is down to a 1 in his back.    PAIN:  Are you having pain? Yes: NPRS scale: 1/10 Pain location: low back Pain description: aching Aggravating factors: prolonged sitting or lying down Relieving factors: rest, heat, walking   OBJECTIVE:  TODAY'S TREATMENT:  Therapeutic Exercise: Completed R hand active digit abd, active assisted add.  Completed passive  stretch for R forearm sup/pron with active assisted R flexion with a supinated forearm and closed fist.  Instructed pt in positioning to perform this on his own at home, stabilizing R forearm in the supinated position with L hand.  Completed wrist maze x3 reps; min vc for minimizing compensatory movement patterns.  Performed active assisted digit opposition to thumb with IF and LF in prep for working with 2 and 3 point pinch patterns to pick up ball pegs.  Neuro re-ed: Facilitation of R hand grasp and release patterns using 2 and 3 point pinch, working to pick up ball pegs from peg board, then used L hand to place peg into R hand for positioning to place pegs back into pegboard.    PATIENT EDUCATION: Education details: HEP progression, grasp/release activities; wrist flex against gravity Person educated: Patient Education method: Explanation Education comprehension: verbalized and demonstrated understanding.      OT Short Term Goals -      OT SHORT TERM GOAL #1   Title Pt. wil demonstrtae independence with HEPs    Baseline Eval: Pt. currently does not have one, 10th visit:  changes in HEP ongoing; 20th: instructed in putty exercises, changes ongoing; 40th: continual updates to HEP as pt progresses; 50th: continual updates   Time 6    Period Weeks    Status On-going    Target Date 11/20/21             OT Long Term Goals -       OT LONG TERM GOAL #1   Title Pt. will improve RUE strength by 2 mm grades to assist with ADLs, and IADLs,    Baseline Eval: Right shoulder flexion, abduction 3/5, elbow flexion, extension wrist extension 3+/5, 10th visit: improving with RUE strength by not yet met goal; 20th visit: R shd flex/abd 4-, elbow flex/ext 4+, wrist flex 4-, wrist ext 4+; 40:  R shd flex/abd 4,  elbow flex/ext 4+, wrist flex 4, wrist ext 4+; 50th visit 8/22: R shoulder flex/abd 4/5, elbow flex/ext 5/5, wrist flex 4, wrist ext 4+   Time 12    Period Weeks    Status On-going    Target  Date 01/25/22      OT LONG TERM GOAL #2   Title Pt. will improve right grip by 10 lbs to prepare for firmly holding objects for IADLs.    Baseline Eval: R: 38#, L: 124# (In 3rd dynamometer slot); R 35# in 4th slot, 28# in 3rd slot, 40th: 47 in 4th slot; 50th: 12/11/21: 46# on 3rd slot   Time 12    Period Weeks    Status On-going    Target Date 01/25/22      OT LONG TERM GOAL #3   Title Pt. will improve right lateral pinch strength by 5# to assist with cutting food    Baseline Eval: R: 17, L: 25; 20th: R: 18; 40th: 15# fingers slipping on meter, 50th: 9# fingers slipping   Time 12    Period Weeks  Status On-going    Target Date 01/25/22      OT LONG TERM GOAL #4   Title Pt. will improve Right 3pt pinch by 2# to be able to hold/open items for cooking    Baseline Eval: R: Pt. unable to engage thumb L: 29#; 20th: R unable; 40th:  unable, fingers slipping; 50th:  unable, fingers slipping   Time 12    Period Weeks    Status On-going    Target Date 01/25/22      OT LONG TERM GOAL #5   Title Pt. will improve right hand [facility] skills to be able to independently manipulate buttons, and zippers.    Baseline Eval: Pt. has difficulty managing buttons,a nd zippers. 10th visit:  improving; 20th: unable to engage R hand effectively, performs 1 handed with L; 50th: performs L handed only   Time 12    Period Weeks    Status On-going    Target Date 01/25/22      OT LONG TERM GOAL #6   Title Pt. will independently write his name    Baseline Eval: Pt. is unable to hold a pen; 20th: pt can grip PenAgain with R hand and can make marks on paper with difficulty, but not yet writing name; 40th:  continues to demonstrate difficulty with grasp of pen; 50th 12/11/21: With assist to set up hand on pen again, pt was able to write first name and initial of last name in 1 1/4" tall letters with fair legibility (pt wants to defer this goal, states this is low on his list of priorities for now)   Time 12    Period  Weeks    Status Defer   Target Date 01/25/22      OT LONG TERM GOAL #7   Title Pt. will improve FOTO score by 2 points to reflect funational improvement    Baseline Eval: FOTO score 46 with TR score 52; 20th: 45; 40th: 56;  50th: 46   Time 12    Period Weeks    Status On-going    Target Date 01/25/22             Plan -    Clinical Impression Statement Facilitation of R hand grasp and release patterns using 2 and 3 point pinch, working to pick up ball pegs from peg board, then used L hand to place peg into R hand for positioning to place pegs back into pegboard.  Pt was able to place and remove more pegs with fewer rest breaks this date, fewer dropped pegs and less slipping of the thumb during 2 and 3 point pinch patterns.  Pt continues to work on wrist flexion against gravity; currently limited by extensor tone in wrist which limits active wrist flexion to neutral unless assisted by OT.  Pt will continue to work towards neuro re-ed of right hand with emphasis on thumb movements to integrate into functional grasping and manipulation skills.    OT Occupational Profile and History Detailed Assessment- Review of Records and additional review of physical, cognitive, psychosocial history related to current functional performance    Occupational performance deficits (Please refer to evaluation for details): ADL's;IADL's;Education    Rehab Potential Good    Clinical Decision Making Several treatment options, min-mod task modification necessary    Comorbidities Affecting Occupational Performance: May have comorbidities impacting occupational performance    Modification or Assistance to Complete Evaluation  Min-Moderate modification of tasks or assist with assess necessary to complete eval  OT Frequency 2x / week    OT Duration 12 weeks    OT Treatment/Interventions Self-care/ADL training;DME and/or AE instruction;Therapeutic exercise;Ultrasound;Neuromuscular education;Therapeutic  activities;Energy conservation;Moist Heat;Patient/family education;Splinting;Functional Mobility Training;Paraffin    Plan Neuro re-ed and therapeutic exercise    Consulted and Agree with Plan of Care Patient             Leta Speller, MS, OTR/L   Darleene Cleaver, OT 01/09/2022, 8:43 AM

## 2022-01-11 ENCOUNTER — Encounter: Payer: Medicare HMO | Admitting: Speech Pathology

## 2022-01-11 ENCOUNTER — Ambulatory Visit: Payer: Medicare Other

## 2022-01-11 DIAGNOSIS — M6281 Muscle weakness (generalized): Secondary | ICD-10-CM | POA: Diagnosis not present

## 2022-01-11 DIAGNOSIS — Z8673 Personal history of transient ischemic attack (TIA), and cerebral infarction without residual deficits: Secondary | ICD-10-CM

## 2022-01-11 DIAGNOSIS — R278 Other lack of coordination: Secondary | ICD-10-CM

## 2022-01-11 DIAGNOSIS — M545 Low back pain, unspecified: Secondary | ICD-10-CM

## 2022-01-11 DIAGNOSIS — R2681 Unsteadiness on feet: Secondary | ICD-10-CM

## 2022-01-11 NOTE — Therapy (Signed)
[place] MAIN [facility] SERVICES [address] [place], [place], [address] Phone: [phone]   Fax:  [phone]  Patient Details  Name: David Reeves MRN: [id number] Date of Birth: 1970/10/16 Referring Provider:  [name], PA-C  Encounter Date: 01/11/2022   OUTPATIENT PHYSICAL THERAPY TREATMENT NOTE      Patient Name: David Reeves MRN: [id number] DOB:09/08/1970, 51 y.o., male Today's Date: 01/11/2022  PCP: [name], PA-C REFERRING PROVIDER: [name], PA-C   PT End of Session - 01/11/22 1051     Visit Number 49    Number of Visits 56    Date for PT Re-Evaluation 02/05/22    Authorization Type [employer]    Progress Note Due on Visit 62    PT Start Time 1019    PT Stop Time 1058    PT Time Calculation (min) 39 min    Equipment Utilized During Treatment Gait belt    Activity Tolerance Patient tolerated treatment well;No increased pain    Behavior During Therapy [facility] for tasks assessed/performed                        Past Medical History:  Diagnosis Date   Back pain 04/22/2012   Bone spur    Bulging disc 04/22/2012   Degenerative disc disease    Osteoarthritis    Panic anxiety syndrome    Stroke ([place]) 10/2020   Taking multiple medications for chronic disease    Past Surgical History:  Procedure Laterality Date   CYSTECTOMY     head   HERNIA REPAIR Left 2006,2014   [facility]   TOE SURGERY Right 2007   Patient Active Problem List   Diagnosis Date Noted   Alterations of sensations following cerebrovascular accident 03/08/2021   Aphasia as late effect of cerebrovascular accident (CVA) 03/08/2021   Muscle spasm 02/22/2021   Acute non-recurrent frontal sinusitis 02/22/2021   Chronic pain syndrome 01/23/2021   Primary hypertension 01/23/2021   Nerve pain 01/23/2021   Erectile dysfunction due to diseases classified elsewhere 01/23/2021   Cognitive dysfunction 01/23/2021    Scrotal edema 01/23/2021   Elevated LDL cholesterol level 01/23/2021   History of stroke with residual deficit 01/23/2021   Primary insomnia 01/23/2021   Mouth pain 01/23/2021   Laceration of right hand without foreign body    Closed compression fracture of L1 vertebra ([place]) 05/24/2016   Lumbar stenosis with neurogenic claudication 11/01/2015   Chronic bilateral low back pain with right-sided sciatica 11/08/2014   Hx of hemorrhoids 11/08/2014   AAA (abdominal aortic aneurysm) ([place]) 09/22/2014   Chronic pain associated with significant psychosocial dysfunction 09/22/2014   Panic disorder 09/22/2014   AB (asthmatic bronchitis) 08/17/2014   Anxiety 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 08/17/2014   Backache 08/17/2014   Lumbosacral spondylosis without myelopathy 08/17/2014   Disorder of male genital organs 08/17/2014   Brash 08/17/2014   Low back pain 08/17/2014   Tendon nodule 08/17/2014   Episodic paroxysmal anxiety disorder 08/17/2014   Hernia, inguinal, right 08/17/2014   Fast heart beat 08/17/2014   Illness 08/17/2014   Inguinal hernia 10/14/2012    REFERRING DIAG: History of stroke with residual deficit, chronic bilateral low back pain with right-sided sciatica, Lumbosacral spondylosis without myelopathy THERAPY DIAG:  Muscle weakness (generalized)  Chronic bilateral low back pain without sciatica  Unsteadiness on feet  Rationale for Evaluation and Treatment Rehabilitation  PERTINENT HISTORY: Pt. is a 51 y.o. who was diagnosed  with a CVA on July 21st, 2022. Pt. completed several weeks of inpatient rehab at [facility]. After returning to home. He was discharged in late August/early September and recieved home health PT. Pt. sustained a fall in december of 2022, and was admitted to the hospital with COVID-19, and Chronic back pain. He reports chronic back pain syndrome since 2012. Since the most recent discharge, Pt. has been residing with the ex-wife until the Pt. is ready to  return to independent living. He did recieve 2 weeks of home health after discharge in December 2022. He is now being referred to outpatient PT to address weakness from stroke and improve fine motor movement. He reports rarely getting numbness/tingling in RLE. PMHx includes: Bilateral LBP with right sided sciatica, Lumbosacral spondylosis myelopathy, closed compression Fx of L1. Has chronic insomnia. osteoarthritis, Panic anxiety syndrome (taking medication) Pt. enjoys cooking, and riding motorcycles. Patient is going to [facility] spine center for Epidural spinal injections on 06/07/21;  PRECAUTIONS: Fall, Spinal Brace wears daily, approximately 25%, when exercising; semi-rigid lumbar brace  SUBJECTIVE: Pt reports pain currently 1/10 (started at a 2/10 this am). No stumbles/falls. No other updates. PAIN:  Are you having pain? Yes: NPRS scale: 1/10 Pain location: bilat low back Pain description: dull ache; always constant,  Aggravating factors: worse with sitting,  Relieving factors: heat helps temporarily, pain pills/pain patch   TODAY'S TREATMENT 01/11/22      NMR: Gati belt donned, CGA provided throughout    SLB 2x30 sec each LE Tandem  2x30 sec each LE      TherEx-  On mat table:  Heat donned to low back while pt performs the following. Skin checked following application of heat and is WNL. No adverse reaction to treatment.  Bridges 20x  -Then performed more challenging variation on stability ball with addition of hamstring curls 20x -LTR 10x 10# donned: SLR x 20 each LE. Medium  Adductor squeezes 20x with 3 sec holds  Leg press: 70# 10x Easy 85# 5x 100# 8x. Medium 115# 6x    PATIENT EDUCATION:  Education details: Pt educated throughout session about proper posture and technique with exercises. Improved exercise technique, movement at target joints, use of target muscles after min to mod verbal, visual, tactile cues.  Person educated: Patient Education method: Explanation,  Demonstration, Tactile cues, and Verbal cues Education comprehension: verbalized understanding, returned demonstration, verbal cues required, tactile cues required, and needs further education   HOME EXERCISE PROGRAM:   No updates on this date pt to continue HEP as previously given     PT Short Term Goals -       PT SHORT TERM GOAL #1   Title Patient will be adherent to HEP at least 3x a week to improve functional strength and balance for better safety at home.    Baseline 4/4: doing them 1-2x a week; 08/20/2021=Patient reports performing his low back stretching and no questions currently HEP. 10/24/21: doing HEP 4x a week   Time 4    Period Weeks    Status Achieved    Target Date 08/21/21      PT SHORT TERM GOAL #2   Title Patient (< 75 years old) will complete five times sit to stand test in < 15 seconds indicating an increased LE strength and improved balance.    Baseline 4/4: 17.85 sec with arms across chest; 08/20/2021= 17.6 sec , 7/5: 12.85 sec   Time 4    Period Weeks    Status Achieved;  Target Date 10/01/21              PT Long Term Goals -       PT LONG TERM GOAL #1   Title Patient will increase six minute walk test distance to >1000 for progression to community ambulator and improve gait ability    Baseline 4/4: 1115 feet , 7/5: 1080 feet;    Time 4    Period Weeks    Status Achieved    Target Date 08/21/21      PT LONG TERM GOAL #2   Title Patient will ascend/descend 8 stairs without rail assist independently without loss of balance to improve ability to get in/out of home.    Baseline 4/4: requires 1 rail assist; 08/20/2021- Patient demonstrated steps without railing using reciprocal steps today. 7/5: ascend/descend 6 steps reciprocally without rail, but required CGA for safety when descending; 7/25: Able to complete without UE support and good balance going up steps, unsafe descending steps without UE support. Required min assist and UE support to prevent LOB  descending.; 8/15: requires UUE support to descend and close CGA due to unsteadiness    Time 4    Period Weeks    Status On-going    Target Date 12/11/2021      PT LONG TERM GOAL #3   Title Patient will increase BLE gross strength to 4+/5 as to improve functional strength for independent gait, increased standing tolerance and increased ADL ability.    Baseline 4/4: not formally assessed; 08/20/2021= 4+/5 with Right hip flex/knee ext/flex; Right hip abd= 4/5, 7/5: 4+/5 hip    Time 12    Period Weeks    Status Achieved;    Target Date 11/12/21      PT LONG TERM GOAL #4   Title Patient will improve FOTO score to >50% to indicate improved functional mobility with less pain with ADLs.    Baseline 4/4: 35%; 08/20/2021= 42% 7/5: 47%; 7:25:recently addressed; 11/30/21: 35   Time 12    Period Weeks    Status Partially Met   Target Date 02/05/2022      PT LONG TERM GOAL #5   Title Patient will report a worst pain of 4/10 in low back over last week to indicate improved tolerance with ADLs.    Baseline 4/4: 7/10; 08/20/2021= 4/10 , 7/5: worst 7/10 in low back; 7/25: 6/10 ; 8/15: pt reports 6/10   Time 12    Period Weeks    Status  On-going   Target Date 02/05/2022      Additional Long Term Goals   Additional Long Term Goals Yes      PT LONG TERM GOAL #6   Title Pt will increase 10MWT by at least 0.13 m/s in order to demonstrate clinically significant improvement in community ambulation    Baseline 08/20/2021=0.94 m/s 7/5: 1.5 m/s   Time 12    Period Weeks    Status Achieved;    Target Date 11/12/21      PT LONG TERM GOAL #7   Title Patient will demonstrate improved static standing balance as seen by ability to single leg stance on right LE > 12 sec consistently for optimal balance on level and unlevel surfaces.    Baseline 08/20/2021= 4 sec SLS on right , 7/5: 4-5 sec inconsistent; 7/25 able to achieve 15 sec after multiple attempts of 4-5 sec holds; 8/15: LLE 15 sec, RLE 8 sec   Time 12     Period Weeks  Status Partially Met   Target Date 02/05/2022              Plan -     Clinical Impression Statement Pt able to further progress strengthening exercises today with no increase in pain, indicating improved BLE strength and activity tolerance.While he shows progress, pt continues to exhibit SLB deficit. The pt will benefit from further skilled PT to improve strength and balance, and decrease pain levels.   Personal Factors and Comorbidities Comorbidity 3+;Past/Current Experience;Time since onset of injury/illness/exacerbation    Comorbidities PMHx includes: Bilateral LBP with right sided sciatica, Lumbosacral spondylosis myelopathy, closed compression Fx of L1. Has chronic insomnia. osteoarthritis, Panic anxiety syndrome (taking medication)    Examination-Activity Limitations Bend;Carry;Locomotion Level;Sit;Squat;Stairs;Stand;Transfers    Examination-Participation Restrictions Cleaning;Community Activity;Driving;Laundry;Meal Prep;Occupation;Shop;Volunteer;Yard Work    Stability/Clinical Decision Making Stable/Uncomplicated    Rehab Potential Good    PT Frequency 2x / week    PT Duration 12 weeks    PT Treatment/Interventions Cryotherapy;Electrical Stimulation;Moist Heat;Traction;Gait training;Stair training;Functional mobility training;Therapeutic activities;Therapeutic exercise;Balance training;Neuromuscular re-education;Patient/family education;Manual techniques;Passive range of motion;Dry needling;Energy conservation    PT Next Visit Plan  Progress core stabilization, Manual therapy for low back and LE ROM, instruct in balance HEP, continue plan       Consulted and Agree with Plan of Care Patient              11:12 AM, 01/11/22   Zollie Pee, PT 01/11/2022, 11:12 AM  [place] MAIN [facility] SERVICES [address] [place], [place], [address] Phone: [phone]   Fax:  [phone]

## 2022-01-14 NOTE — Therapy (Signed)
[place] MAIN [facility] SERVICES [address] [place], [place], [address] Phone: [phone]   Fax:  [phone]  Patient Details  Name: David Reeves MRN: [id number] Date of Birth: 1971-04-10 Referring Provider:  [name], PA-C  Encounter Date: 01/15/2022   OUTPATIENT PHYSICAL THERAPY TREATMENT NOTE/Physical Therapy Progress Note   Dates of reporting period  12/04/2021   to   01/15/2022       Patient Name: David Reeves MRN: [id number] DOB:Jul 11, 1970, 51 y.o., male Today's Date: 01/15/2022  PCP: David Kirschner, PA-C REFERRING PROVIDER: [name], PA-C   PT End of Session - 01/15/22 1011     Visit Number 27    Number of Visits 21    Date for PT Re-Evaluation 02/05/22    Authorization Type [employer]    Progress Note Due on Visit 75    PT Start Time 1018    PT Stop Time 1100    PT Time Calculation (min) 42 min    Equipment Utilized During Treatment Gait belt    Activity Tolerance Patient tolerated treatment well;No increased pain    Behavior During Therapy [facility] for tasks assessed/performed                         Past Medical History:  Diagnosis Date   Back pain 04/22/2012   Bone spur    Bulging disc 04/22/2012   Degenerative disc disease    Osteoarthritis    Panic anxiety syndrome    Stroke ([place]) 10/2020   Taking multiple medications for chronic disease    Past Surgical History:  Procedure Laterality Date   CYSTECTOMY     head   HERNIA REPAIR Left 2006,2014   [facility]   TOE SURGERY Right 2007   Patient Active Problem List   Diagnosis Date Noted   Alterations of sensations following cerebrovascular accident 03/08/2021   Aphasia as late effect of cerebrovascular accident (CVA) 03/08/2021   Muscle spasm 02/22/2021   Acute non-recurrent frontal sinusitis 02/22/2021   Chronic pain syndrome 01/23/2021   Primary hypertension 01/23/2021   Nerve pain 01/23/2021   Erectile  dysfunction due to diseases classified elsewhere 01/23/2021   Cognitive dysfunction 01/23/2021   Scrotal edema 01/23/2021   Elevated LDL cholesterol level 01/23/2021   History of stroke with residual deficit 01/23/2021   Primary insomnia 01/23/2021   Mouth pain 01/23/2021   Laceration of right hand without foreign body    Closed compression fracture of L1 vertebra ([place]) 05/24/2016   Lumbar stenosis with neurogenic claudication 11/01/2015   Chronic bilateral low back pain with right-sided sciatica 11/08/2014   Hx of hemorrhoids 11/08/2014   AAA (abdominal aortic aneurysm) ([place]) 09/22/2014   Chronic pain associated with significant psychosocial dysfunction 09/22/2014   Panic disorder 09/22/2014   AB (asthmatic bronchitis) 08/17/2014   Anxiety 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 08/17/2014   Backache 08/17/2014   Lumbosacral spondylosis without myelopathy 08/17/2014   Disorder of male genital organs 08/17/2014   Brash 08/17/2014   Low back pain 08/17/2014   Tendon nodule 08/17/2014   Episodic paroxysmal anxiety disorder 08/17/2014   Hernia, inguinal, right 08/17/2014   Fast heart beat 08/17/2014   Illness 08/17/2014   Inguinal hernia 10/14/2012    REFERRING DIAG: History of stroke with residual deficit, chronic bilateral low back pain with right-sided sciatica, Lumbosacral spondylosis without myelopathy THERAPY DIAG:  Chronic bilateral low back pain without sciatica  Unsteadiness on feet  Muscle weakness (  generalized)  Other lack of coordination  Rationale for Evaluation and Treatment Rehabilitation  PERTINENT HISTORY: Pt. is a 51 y.o. who was diagnosed with a CVA on July 21st, 2022. Pt. completed several weeks of inpatient rehab at [facility]. After returning to home. He was discharged in late August/early September and recieved home health PT. Pt. sustained a fall in december of 2022, and was admitted to the hospital with COVID-19, and Chronic back pain. He reports chronic  back pain syndrome since 2012. Since the most recent discharge, Pt. has been residing with the ex-wife until the Pt. is ready to return to independent living. He did recieve 2 weeks of home health after discharge in December 2022. He is now being referred to outpatient PT to address weakness from stroke and improve fine motor movement. He reports rarely getting numbness/tingling in RLE. PMHx includes: Bilateral LBP with right sided sciatica, Lumbosacral spondylosis myelopathy, closed compression Fx of L1. Has chronic insomnia. osteoarthritis, Panic anxiety syndrome (taking medication) Pt. enjoys cooking, and riding motorcycles. Patient is going to [facility] spine center for Epidural spinal injections on 06/07/21;  PRECAUTIONS: Fall, Spinal Brace wears daily, approximately 25%, when exercising; semi-rigid lumbar brace  SUBJECTIVE: Pt reports pain currently 2/10.  Pt walked 45 min at [facility] and pain increased up to a 4/10. He says his back pain started to increase within 15 minutes of walking.  PAIN:  Are you having pain? Yes: NPRS scale: 2/10 Pain location: bilat low back Pain description: dull ache; always constant,  Aggravating factors: worse with sitting,  Relieving factors: heat helps temporarily, pain pills/pain patch   TODAY'S TREATMENT 01/15/22  TherAct- Reviewed goals on this date. PT instructed pt throughout in technique with testing and indications of test performance on progress and plan. See goal section below for details  TherEx-  Leg press: 70# 10x Easy 85# 10x Easy  100# 10x. Medium 115# 10x. Medium  Stability ball FWD/BCKWD/LTL rollouts x multiple reps of each Seated thoracic ext. over chair 15x Seated trunk twists 10x each Standing side-bend 4x each Comments: No increase in pain throughout    PATIENT EDUCATION:  Education details: Pt educated throughout session about proper posture and technique with exercises. Improved exercise technique, movement at target joints, use  of target muscles after min to mod verbal, visual, tactile cues.  Person educated: Patient Education method: Explanation, Demonstration, Tactile cues, and Verbal cues Education comprehension: verbalized understanding, returned demonstration, verbal cues required, tactile cues required, and needs further education   HOME EXERCISE PROGRAM:   No updates on this date pt to continue HEP as previously given     PT Short Term Goals -       PT SHORT TERM GOAL #1   Title Patient will be adherent to HEP at least 3x a week to improve functional strength and balance for better safety at home.    Baseline 4/4: doing them 1-2x a week; 08/20/2021=Patient reports performing his low back stretching and no questions currently HEP. 10/24/21: doing HEP 4x a week   Time 4    Period Weeks    Status Achieved    Target Date 08/21/21      PT SHORT TERM GOAL #2   Title Patient (< 34 years old) will complete five times sit to stand test in < 15 seconds indicating an increased LE strength and improved balance.    Baseline 4/4: 17.85 sec with arms across chest; 08/20/2021= 17.6 sec , 7/5: 12.85 sec   Time 4  Period Weeks    Status Achieved;    Target Date 10/01/21              PT Long Term Goals -       PT LONG TERM GOAL #1   Title Patient will increase six minute walk test distance to >1000 for progression to community ambulator and improve gait ability    Baseline 4/4: 1115 feet , 7/5: 1080 feet;    Time 4    Period Weeks    Status Achieved    Target Date 08/21/21      PT LONG TERM GOAL #2   Title Patient will ascend/descend 8 stairs without rail assist independently without loss of balance to improve ability to get in/out of home.    Baseline 4/4: requires 1 rail assist; 08/20/2021- Patient demonstrated steps without railing using reciprocal steps today. 7/5: ascend/descend 6 steps reciprocally without rail, but required CGA for safety when descending; 7/25: Able to complete without UE support and  good balance going up steps, unsafe descending steps without UE support. Required min assist and UE support to prevent LOB descending.; 8/15: requires UUE support to descend and close CGA due to; unsteadiness; 9/26: ascend without UE support, recip pattern; descend with UUE support, recip stepping, but still doesn't feel steady yet with descending   Time 12*corrected   Period Weeks    Status Partially met   Target Date 02/05/2022 * corrected date     PT [place] #3   Title Patient will increase BLE gross strength to 4+/5 as to improve functional strength for independent gait, increased standing tolerance and increased ADL ability.    Baseline 4/4: not formally assessed; 08/20/2021= 4+/5 with Right hip flex/knee ext/flex; Right hip abd= 4/5, 7/5: 4+/5 hip    Time 12    Period Weeks    Status Achieved;    Target Date 11/12/21      PT LONG TERM GOAL #4   Title Patient will improve FOTO score to >50% to indicate improved functional mobility with less pain with ADLs.    Baseline 4/4: 35%; 08/20/2021= 42% 7/5: 47%; 7:25:recently addressed; 11/30/21: 35; 9/26: 54%    Time 12    Period Weeks    Status MET   Target Date 02/05/2022      PT LONG TERM GOAL #5   Title Patient will report a worst pain of 4/10 in low back over last week to indicate improved tolerance with ADLs.    Baseline 4/4: 7/10; 08/20/2021= 4/10 , 7/5: worst 7/10 in low back; 7/25: 6/10 ; 8/15: pt reports 6/10; 9/26: 4/10   Time 12    Period Weeks    Status Achieved    Target Date 02/05/2022      Additional Long Term Goals   Additional Long Term Goals Yes      PT LONG TERM GOAL #6   Title Pt will increase 10MWT by at least 0.13 m/s in order to demonstrate clinically significant improvement in community ambulation    Baseline 08/20/2021=0.94 m/s 7/5: 1.5 m/s   Time 12    Period Weeks    Status Achieved;    Target Date 11/12/21      PT LONG TERM GOAL #7   Title Patient will demonstrate improved static standing balance as  seen by ability to single leg stance on right LE > 12 sec consistently for optimal balance on level and unlevel surfaces.    Baseline 08/20/2021= 4 sec SLS on right ,  7/5: 4-5 sec inconsistent; 7/25 able to achieve 15 sec after multiple attempts of 4-5 sec holds; 8/15: LLE 15 sec, RLE 8 sec; 9/26: LLE 30 sec, RLE 17 seconds   Time 12    Period Weeks    Status MET   Target Date 02/05/2022    PT LONG TERM GOAL #8  Title Pt will report ability to ambulate for at least 15 minutes without an increase in his LBP in order to improve community participation and ease with ADLs  Baseline 9/26: increase in pain at 15 min mark  Time 3   Period Weeks   Status NEW  Target Date 02/05/2022             Plan -     Clinical Impression Statement Goals reassessed for progress note. Pt making gains AEB achieving FOTO, SLS, and rating of worst back pain goal. This indicates improved perceived functional mobility and QOL, improved SLB, and decreased worst LBP. While pt making gains, he still exhibits deficits with safe stair navigation. PT has also added new goal to address pt's ability to ambulate without increased pain. The pt will benefit from further skilled PT to improve strength and balance, and decrease pain levels.   Personal Factors and Comorbidities Comorbidity 3+;Past/Current Experience;Time since onset of injury/illness/exacerbation    Comorbidities PMHx includes: Bilateral LBP with right sided sciatica, Lumbosacral spondylosis myelopathy, closed compression Fx of L1. Has chronic insomnia. osteoarthritis, Panic anxiety syndrome (taking medication)    Examination-Activity Limitations Bend;Carry;Locomotion Level;Sit;Squat;Stairs;Stand;Transfers    Examination-Participation Restrictions Cleaning;Community Activity;Driving;Laundry;Meal Prep;Occupation;Shop;Volunteer;Yard Work    Stability/Clinical Decision Making Stable/Uncomplicated    Rehab Potential Good    PT Frequency 2x / week    PT Duration 12  weeks    PT Treatment/Interventions Cryotherapy;Electrical Stimulation;Moist Heat;Traction;Gait training;Stair training;Functional mobility training;Therapeutic activities;Therapeutic exercise;Balance training;Neuromuscular re-education;Patient/family education;Manual techniques;Passive range of motion;Dry needling;Energy conservation    PT Next Visit Plan  Progress core stabilization, Manual therapy for low back and LE ROM, instruct in balance HEP, continue plan       Consulted and Agree with Plan of Care Patient              3:06 PM, 01/15/22   Zollie Pee, PT 01/15/2022, 3:06 PM  [place] MAIN [facility] SERVICES [address] [place], [place], [address] Phone: [phone]   Fax:  [phone]

## 2022-01-14 NOTE — Therapy (Signed)
OUTPATIENT OCCUPATIONAL THERAPY TREATMENT NOTE   Patient Name: David Reeves MRN: [id number] DOB:[birth year], 51 y.o., male Today's Date: 01/14/2022  PCP: [name], PA-C REFERRING PROVIDER: [name], PA-C  END OF SESSION:      OT End of Session - 01/14/22 1129     Visit Number 71    Number of Visits 67    Date for OT Re-Evaluation 01/25/22    Authorization Type Progress reporting period starting 12/11/21    OT Start Time 0917    OT Stop Time 1002    OT Time Calculation (min) 45 min    Activity Tolerance Patient tolerated treatment well    Behavior During Therapy [facility] for tasks assessed/performed               Past Medical History:  Diagnosis Date   Back pain 04/22/2012   Bone spur    Bulging disc 04/22/2012   Degenerative disc disease    Osteoarthritis    Panic anxiety syndrome    Stroke ([place]) 10/2020   Taking multiple medications for chronic disease    Past Surgical History:  Procedure Laterality Date   CYSTECTOMY     head   HERNIA REPAIR Left 2006,2014   [facility]   TOE SURGERY Right 2007   Patient Active Problem List   Diagnosis Date Noted   Alterations of sensations following cerebrovascular accident 03/08/2021   Aphasia as late effect of cerebrovascular accident (CVA) 03/08/2021   Muscle spasm 02/22/2021   Acute non-recurrent frontal sinusitis 02/22/2021   Chronic pain syndrome 01/23/2021   Primary hypertension 01/23/2021   Nerve pain 01/23/2021   Erectile dysfunction due to diseases classified elsewhere 01/23/2021   Cognitive dysfunction 01/23/2021   Scrotal edema 01/23/2021   Elevated LDL cholesterol level 01/23/2021   History of stroke with residual deficit 01/23/2021   Primary insomnia 01/23/2021   Mouth pain 01/23/2021   Laceration of right hand without foreign body    Closed compression fracture of L1 vertebra ([place]) 05/24/2016   Lumbar stenosis with neurogenic claudication 11/01/2015   Chronic bilateral low back pain with  right-sided sciatica 11/08/2014   Hx of hemorrhoids 11/08/2014   AAA (abdominal aortic aneurysm) ([place]) 09/22/2014   Chronic pain associated with significant psychosocial dysfunction 09/22/2014   Panic disorder 09/22/2014   AB (asthmatic bronchitis) 08/17/2014   Anxiety 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 08/17/2014   Backache 08/17/2014   Lumbosacral spondylosis without myelopathy 08/17/2014   Disorder of male genital organs 08/17/2014   Brash 08/17/2014   Low back pain 08/17/2014   Tendon nodule 08/17/2014   Episodic paroxysmal anxiety disorder 08/17/2014   Hernia, inguinal, right 08/17/2014   Fast heart beat 08/17/2014   Illness 08/17/2014   Inguinal hernia 10/14/2012    ONSET DATE: 11/09/2020  REFERRING DIAG: CVA  THERAPY DIAG:  Muscle weakness (generalized)  Other lack of coordination  H/O ischemic left MCA stroke  Rationale for Evaluation and Treatment Rehabilitation  PERTINENT HISTORY: Pt. is a 51 y.o. who was diagnosed with a CVA on July 21st, 2022. Pt. completed several weeks of  inpatient rehab at [facility]. After returning to home, Pt. sustained a fall in december of 2022, and was admitted to the hospital with COVI-19, and back pain from the fall. Since the most recent discharge, Pt. has been residing with the ex-wife until the Pt. is ready to return to independent living. Pt. PMHx includes: Bilateral LBP with right sided sciatica, Lumbosacral spondylosis myelopathy, closed compression Fx of L1. Pt. enjoys  cooking, and riding motorcycles.   PRECAUTIONS: Fall  SUBJECTIVE: Pt reports his birthday is on [birth year] and he plans to relax at home.   PAIN:  Are you having pain? Yes: NPRS scale: 1/10 Pain location: low back Pain description: aching Aggravating factors: prolonged sitting or lying down Relieving factors: rest, heat, walking   OBJECTIVE:  TODAY'S TREATMENT:  Therapeutic Exercise: Completed R hand active digit abd, active assisted add x2 sets 10 reps each.   Completed place and hold for R thumb radial abduction x10 each.  Completed passive stretch for R forearm sup/pron with active assisted R wrist flexion with a supinated forearm and closed fist.  Performed active assisted palmar abd/add in in prep for grasp/release activities noted below.  Neuro re-ed: Facilitation of R hand grasp and release patterns using 2 and 3 point pinch, working to pick up ball pegs from peg board, then used L hand to place peg into R hand for positioning to place pegs back into pegboard.    PATIENT EDUCATION: Education details: HEP progression, grasp/release activities; wrist flex against gravity Person educated: Patient Education method: Explanation Education comprehension: verbalized and demonstrated understanding.      OT Short Term Goals -      OT SHORT TERM GOAL #1   Title Pt. wil demonstrtae independence with HEPs    Baseline Eval: Pt. currently does not have one, 10th visit:  changes in HEP ongoing; 20th: instructed in putty exercises, changes ongoing; 40th: continual updates to HEP as pt progresses; 50th: continual updates   Time 6    Period Weeks    Status On-going    Target Date 11/20/21             OT Long Term Goals -       OT LONG TERM GOAL #1   Title Pt. will improve RUE strength by 2 mm grades to assist with ADLs, and IADLs,    Baseline Eval: Right shoulder flexion, abduction 3/5, elbow flexion, extension wrist extension 3+/5, 10th visit: improving with RUE strength by not yet met goal; 20th visit: R shd flex/abd 4-, elbow flex/ext 4+, wrist flex 4-, wrist ext 4+; 40:  R shd flex/abd 4,  elbow flex/ext 4+, wrist flex 4, wrist ext 4+; 50th visit 8/22: R shoulder flex/abd 4/5, elbow flex/ext 5/5, wrist flex 4, wrist ext 4+   Time 12    Period Weeks    Status On-going    Target Date 01/25/22      OT LONG TERM GOAL #2   Title Pt. will improve right grip by 10 lbs to prepare for firmly holding objects for IADLs.    Baseline Eval: R: 38#, L:  124# (In 3rd dynamometer slot); R 35# in 4th slot, 28# in 3rd slot, 40th: 47 in 4th slot; 50th: 12/11/21: 46# on 3rd slot   Time 12    Period Weeks    Status On-going    Target Date 01/25/22      OT LONG TERM GOAL #3   Title Pt. will improve right lateral pinch strength by 5# to assist with cutting food    Baseline Eval: R: 17, L: 25; 20th: R: 18; 40th: 15# fingers slipping on meter, 50th: 9# fingers slipping   Time 12    Period Weeks    Status On-going    Target Date 01/25/22      OT LONG TERM GOAL #4   Title Pt. will improve Right 3pt pinch by 2# to be able to hold/open  items for cooking    Baseline Eval: R: Pt. unable to engage thumb L: 29#; 20th: R unable; 40th:  unable, fingers slipping; 50th:  unable, fingers slipping   Time 12    Period Weeks    Status On-going    Target Date 01/25/22      OT LONG TERM GOAL #5   Title Pt. will improve right hand [facility] skills to be able to independently manipulate buttons, and zippers.    Baseline Eval: Pt. has difficulty managing buttons,a nd zippers. 10th visit:  improving; 20th: unable to engage R hand effectively, performs 1 handed with L; 50th: performs L handed only   Time 12    Period Weeks    Status On-going    Target Date 01/25/22      OT LONG TERM GOAL #6   Title Pt. will independently write his name    Baseline Eval: Pt. is unable to hold a pen; 20th: pt can grip PenAgain with R hand and can make marks on paper with difficulty, but not yet writing name; 40th:  continues to demonstrate difficulty with grasp of pen; 50th 12/11/21: With assist to set up hand on pen again, pt was able to write first name and initial of last name in 1 1/4" tall letters with fair legibility (pt wants to defer this goal, states this is low on his list of priorities for now)   Time 12    Period Weeks    Status Defer   Target Date 01/25/22      OT LONG TERM GOAL #7   Title Pt. will improve FOTO score by 2 points to reflect funational improvement     Baseline Eval: FOTO score 46 with TR score 52; 20th: 45; 40th: 66;  50th: 46   Time 12    Period Weeks    Status On-going    Target Date 01/25/22             Plan -    Clinical Impression Statement Good tolerance to tx activities this date with frequent rest breaks d/t RUE fatigue.  Facilitation of R hand grasp and release patterns using 2 and 3 point pinch, working to pick up ball pegs from peg board, then used L hand to place peg into R hand for positioning to place pegs back into pegboard.  Pt continues to be able to place and remove more pegs with fewer rest breaks this date, fewer dropped pegs and less slipping of the thumb during 2 and 3 point pinch patterns.  Pt reports he worked on wrist flexion against gravity at home but can't move past midline at this time.  OT encouraged continued attempts for building strength in wrist flexors to counteract extensor tone in wrist.   Pt will continue to work towards neuro re-ed of right hand with emphasis on thumb movements to integrate into functional grasping and manipulation skills.    OT Occupational Profile and History Detailed Assessment- Review of Records and additional review of physical, cognitive, psychosocial history related to current functional performance    Occupational performance deficits (Please refer to evaluation for details): ADL's;IADL's;Education    Rehab Potential Good    Clinical Decision Making Several treatment options, min-mod task modification necessary    Comorbidities Affecting Occupational Performance: May have comorbidities impacting occupational performance    Modification or Assistance to Complete Evaluation  Min-Moderate modification of tasks or assist with assess necessary to complete eval    OT Frequency 2x / week  OT Duration 12 weeks    OT Treatment/Interventions Self-care/ADL training;DME and/or AE instruction;Therapeutic exercise;Ultrasound;Neuromuscular education;Therapeutic activities;Energy  conservation;Moist Heat;Patient/family education;Splinting;Functional Mobility Training;Paraffin    Plan Neuro re-ed and therapeutic exercise    Consulted and Agree with Plan of Care Patient             Leta Speller, MS, OTR/L   Darleene Cleaver, OT 01/14/2022, 11:30 AM

## 2022-01-15 ENCOUNTER — Encounter: Payer: Medicare HMO | Admitting: Speech Pathology

## 2022-01-15 ENCOUNTER — Ambulatory Visit: Payer: Medicare Other

## 2022-01-15 DIAGNOSIS — M6281 Muscle weakness (generalized): Secondary | ICD-10-CM | POA: Diagnosis not present

## 2022-01-15 DIAGNOSIS — G8929 Other chronic pain: Secondary | ICD-10-CM

## 2022-01-15 DIAGNOSIS — Z8673 Personal history of transient ischemic attack (TIA), and cerebral infarction without residual deficits: Secondary | ICD-10-CM

## 2022-01-15 DIAGNOSIS — R278 Other lack of coordination: Secondary | ICD-10-CM

## 2022-01-15 DIAGNOSIS — M545 Low back pain, unspecified: Secondary | ICD-10-CM

## 2022-01-15 DIAGNOSIS — R2681 Unsteadiness on feet: Secondary | ICD-10-CM

## 2022-01-16 NOTE — Therapy (Signed)
OUTPATIENT OCCUPATIONAL THERAPY TREATMENT NOTE   Patient Name: David Reeves MRN: [id number] DOB:1970-09-07, 51 y.o., male Today's Date: 01/16/2022  PCP: [name], PA-C REFERRING PROVIDER: [name], PA-C  END OF SESSION:      OT End of Session - 01/16/22 1004     Visit Number 13    Number of Visits 75    Date for OT Re-Evaluation 01/25/22    Authorization Type Progress reporting period starting 12/11/21    OT Start Time 0920    OT Stop Time 1005    OT Time Calculation (min) 45 min    Activity Tolerance Patient tolerated treatment well    Behavior During Therapy [facility] for tasks assessed/performed               Past Medical History:  Diagnosis Date   Back pain 04/22/2012   Bone spur    Bulging disc 04/22/2012   Degenerative disc disease    Osteoarthritis    Panic anxiety syndrome    Stroke ([place]) 10/2020   Taking multiple medications for chronic disease    Past Surgical History:  Procedure Laterality Date   CYSTECTOMY     head   HERNIA REPAIR Left 2006,2014   [facility]   TOE SURGERY Right 2007   Patient Active Problem List   Diagnosis Date Noted   Alterations of sensations following cerebrovascular accident 03/08/2021   Aphasia as late effect of cerebrovascular accident (CVA) 03/08/2021   Muscle spasm 02/22/2021   Acute non-recurrent frontal sinusitis 02/22/2021   Chronic pain syndrome 01/23/2021   Primary hypertension 01/23/2021   Nerve pain 01/23/2021   Erectile dysfunction due to diseases classified elsewhere 01/23/2021   Cognitive dysfunction 01/23/2021   Scrotal edema 01/23/2021   Elevated LDL cholesterol level 01/23/2021   History of stroke with residual deficit 01/23/2021   Primary insomnia 01/23/2021   Mouth pain 01/23/2021   Laceration of right hand without foreign body    Closed compression fracture of L1 vertebra ([place]) 05/24/2016   Lumbar stenosis with neurogenic claudication 11/01/2015   Chronic bilateral low back pain with  right-sided sciatica 11/08/2014   Hx of hemorrhoids 11/08/2014   AAA (abdominal aortic aneurysm) ([place]) 09/22/2014   Chronic pain associated with significant psychosocial dysfunction 09/22/2014   Panic disorder 09/22/2014   AB (asthmatic bronchitis) 08/17/2014   Anxiety 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 08/17/2014   Backache 08/17/2014   Lumbosacral spondylosis without myelopathy 08/17/2014   Disorder of male genital organs 08/17/2014   Brash 08/17/2014   Low back pain 08/17/2014   Tendon nodule 08/17/2014   Episodic paroxysmal anxiety disorder 08/17/2014   Hernia, inguinal, right 08/17/2014   Fast heart beat 08/17/2014   Illness 08/17/2014   Inguinal hernia 10/14/2012    ONSET DATE: 11/09/2020  REFERRING DIAG: CVA  THERAPY DIAG:  Muscle weakness (generalized)  Other lack of coordination  H/O ischemic left MCA stroke  Rationale for Evaluation and Treatment Rehabilitation  PERTINENT HISTORY: Pt. is a 51 y.o. who was diagnosed with a CVA on July 21st, 2022. Pt. completed several weeks of  inpatient rehab at [facility]. After returning to home, Pt. sustained a fall in december of 2022, and was admitted to the hospital with COVI-19, and back pain from the fall. Since the most recent discharge, Pt. has been residing with the ex-wife until the Pt. is ready to return to independent living. Pt. PMHx includes: Bilateral LBP with right sided sciatica, Lumbosacral spondylosis myelopathy, closed compression Fx of L1. Pt. enjoys  cooking, and riding motorcycles.   PRECAUTIONS: Fall  SUBJECTIVE: Pt reports doing well today.    PAIN:  Are you having pain? Yes: NPRS scale: 2/10 Pain location: low back Pain description: aching Aggravating factors: prolonged sitting or lying down Relieving factors: rest, heat, walking   OBJECTIVE:  TODAY'S TREATMENT:  Therapeutic Exercise: Completed R hand active digit abd, active assisted add x2 sets 10 reps each.  Completed place and hold for R thumb  radial abduction x10 each.  Completed passive stretch for R forearm sup/pron with active assisted R wrist flexion with a supinated forearm and closed fist using 1# dowel (performed bilaterally).  Performed active assisted palmar abd/add in in prep for grasp/release activities noted below using tennis ball to slide thumb in each direction.  Completed wrist maze x3 reps, min vc for technique.    Neuro re-ed: Facilitation of R hand grasp and release patterns using 2 and 3 point pinch, working to pick up ball pegs from peg board.  Pt required mod vc for 3 point pinch patterns, working to allow extra time to engage the R LF instead of using L hand to put the R LF in place.    PATIENT EDUCATION: Education details: HEP progression, grasp/release activities; wrist flex against gravity Person educated: Patient Education method: Explanation Education comprehension: verbalized and demonstrated understanding.      OT Short Term Goals -      OT SHORT TERM GOAL #1   Title Pt. wil demonstrtae independence with HEPs    Baseline Eval: Pt. currently does not have one, 10th visit:  changes in HEP ongoing; 20th: instructed in putty exercises, changes ongoing; 40th: continual updates to HEP as pt progresses; 50th: continual updates   Time 6    Period Weeks    Status On-going    Target Date 11/20/21             OT Long Term Goals -       OT LONG TERM GOAL #1   Title Pt. will improve RUE strength by 2 mm grades to assist with ADLs, and IADLs,    Baseline Eval: Right shoulder flexion, abduction 3/5, elbow flexion, extension wrist extension 3+/5, 10th visit: improving with RUE strength by not yet met goal; 20th visit: R shd flex/abd 4-, elbow flex/ext 4+, wrist flex 4-, wrist ext 4+; 40:  R shd flex/abd 4,  elbow flex/ext 4+, wrist flex 4, wrist ext 4+; 50th visit 8/22: R shoulder flex/abd 4/5, elbow flex/ext 5/5, wrist flex 4, wrist ext 4+   Time 12    Period Weeks    Status On-going    Target Date  01/25/22      OT LONG TERM GOAL #2   Title Pt. will improve right grip by 10 lbs to prepare for firmly holding objects for IADLs.    Baseline Eval: R: 38#, L: 124# (In 3rd dynamometer slot); R 35# in 4th slot, 28# in 3rd slot, 40th: 47 in 4th slot; 50th: 12/11/21: 46# on 3rd slot   Time 12    Period Weeks    Status On-going    Target Date 01/25/22      OT LONG TERM GOAL #3   Title Pt. will improve right lateral pinch strength by 5# to assist with cutting food    Baseline Eval: R: 17, L: 25; 20th: R: 18; 40th: 15# fingers slipping on meter, 50th: 9# fingers slipping   Time 12    Period Weeks    Status On-going  Target Date 01/25/22      OT LONG TERM GOAL #4   Title Pt. will improve Right 3pt pinch by 2# to be able to hold/open items for cooking    Baseline Eval: R: Pt. unable to engage thumb L: 29#; 20th: R unable; 40th:  unable, fingers slipping; 50th:  unable, fingers slipping   Time 12    Period Weeks    Status On-going    Target Date 01/25/22      OT LONG TERM GOAL #5   Title Pt. will improve right hand [facility] skills to be able to independently manipulate buttons, and zippers.    Baseline Eval: Pt. has difficulty managing buttons,a nd zippers. 10th visit:  improving; 20th: unable to engage R hand effectively, performs 1 handed with L; 50th: performs L handed only   Time 12    Period Weeks    Status On-going    Target Date 01/25/22      OT LONG TERM GOAL #6   Title Pt. will independently write his name    Baseline Eval: Pt. is unable to hold a pen; 20th: pt can grip PenAgain with R hand and can make marks on paper with difficulty, but not yet writing name; 40th:  continues to demonstrate difficulty with grasp of pen; 50th 12/11/21: With assist to set up hand on pen again, pt was able to write first name and initial of last name in 1 1/4" tall letters with fair legibility (pt wants to defer this goal, states this is low on his list of priorities for now)   Time 12    Period Weeks     Status Defer   Target Date 01/25/22      OT LONG TERM GOAL #7   Title Pt. will improve FOTO score by 2 points to reflect funational improvement    Baseline Eval: FOTO score 46 with TR score 52; 20th: 45; 40th: 22;  50th: 46   Time 12    Period Weeks    Status On-going    Target Date 01/25/22             Plan -    Clinical Impression Statement Good tolerance to tx activities this date with frequent rest breaks d/t RUE fatigue.  Facilitation of R hand grasp and release patterns using 2 and 3 point pinch, working to pick up ball pegs from peg board.  Pt required mod vc for 3 point pinch patterns, working to allow extra time to engage the R LF instead of using L hand to put the R LF in place.   Pt continues to be able to place and remove more pegs with fewer rest breaks this date, fewer dropped pegs and less slipping of the thumb during 2 and 3 point pinch patterns.  Pt continues to work on wrist flexion against gravity with a closed fist and supinated forearm, requiring active assist from OT to move past neutral d/t extensor tone.  Achieved this position with help from 1# dowel with bilat hands; pt stated that he has a rod which he can use to practice this movement at home.  Pt will continue to work towards neuro re-ed of right hand with emphasis on thumb movements to integrate into functional grasping and manipulation skills.    OT Occupational Profile and History Detailed Assessment- Review of Records and additional review of physical, cognitive, psychosocial history related to current functional performance    Occupational performance deficits (Please refer to evaluation for details):  ADL's;IADL's;Education    Rehab Potential Good    Clinical Decision Making Several treatment options, min-mod task modification necessary    Comorbidities Affecting Occupational Performance: May have comorbidities impacting occupational performance    Modification or Assistance to Complete Evaluation   Min-Moderate modification of tasks or assist with assess necessary to complete eval    OT Frequency 2x / week    OT Duration 12 weeks    OT Treatment/Interventions Self-care/ADL training;DME and/or AE instruction;Therapeutic exercise;Ultrasound;Neuromuscular education;Therapeutic activities;Energy conservation;Moist Heat;Patient/family education;Splinting;Functional Mobility Training;Paraffin    Plan Neuro re-ed and therapeutic exercise    Consulted and Agree with Plan of Care Patient             Leta Speller, MS, OTR/L   Darleene Cleaver, OT 01/16/2022, 10:05 AM

## 2022-01-18 ENCOUNTER — Ambulatory Visit: Payer: Medicare Other

## 2022-01-18 ENCOUNTER — Encounter: Payer: Medicare HMO | Admitting: Speech Pathology

## 2022-01-18 DIAGNOSIS — M6281 Muscle weakness (generalized): Secondary | ICD-10-CM

## 2022-01-18 DIAGNOSIS — R278 Other lack of coordination: Secondary | ICD-10-CM

## 2022-01-18 DIAGNOSIS — Z8673 Personal history of transient ischemic attack (TIA), and cerebral infarction without residual deficits: Secondary | ICD-10-CM

## 2022-01-18 DIAGNOSIS — M545 Low back pain, unspecified: Secondary | ICD-10-CM

## 2022-01-18 NOTE — Therapy (Signed)
[place] MAIN [facility] SERVICES [address] [place], [place], [address] Phone: [phone]   Fax:  [phone]  Patient Details  Name: David Reeves MRN: [id number] Date of Birth: 04-08-1971 Referring Provider:  [name], PA-C  Encounter Date: 01/18/2022   OUTPATIENT PHYSICAL THERAPY TREATMENT NOTE       Patient Name: David Reeves MRN: [id number] DOB:April 28, 1970, 51 y.o., male Today's Date: 01/18/2022  PCP: [name], PA-C REFERRING PROVIDER: [name], PA-C   PT End of Session - 01/18/22 1047     Visit Number 51    Number of Visits 1    Date for PT Re-Evaluation 02/05/22    Authorization Type [employer]    Progress Note Due on Visit 104    PT Start Time 1018    PT Stop Time 1058    PT Time Calculation (min) 40 min    Equipment Utilized During Treatment Gait belt    Activity Tolerance Patient tolerated treatment well;No increased pain    Behavior During Therapy [facility] for tasks assessed/performed                          Past Medical History:  Diagnosis Date   Back pain 04/22/2012   Bone spur    Bulging disc 04/22/2012   Degenerative disc disease    Osteoarthritis    Panic anxiety syndrome    Stroke ([place]) 10/2020   Taking multiple medications for chronic disease    Past Surgical History:  Procedure Laterality Date   CYSTECTOMY     head   HERNIA REPAIR Left 2006,2014   [facility]   TOE SURGERY Right 2007   Patient Active Problem List   Diagnosis Date Noted   Alterations of sensations following cerebrovascular accident 03/08/2021   Aphasia as late effect of cerebrovascular accident (CVA) 03/08/2021   Muscle spasm 02/22/2021   Acute non-recurrent frontal sinusitis 02/22/2021   Chronic pain syndrome 01/23/2021   Primary hypertension 01/23/2021   Nerve pain 01/23/2021   Erectile dysfunction due to diseases classified elsewhere 01/23/2021   Cognitive dysfunction  01/23/2021   Scrotal edema 01/23/2021   Elevated LDL cholesterol level 01/23/2021   History of stroke with residual deficit 01/23/2021   Primary insomnia 01/23/2021   Mouth pain 01/23/2021   Laceration of right hand without foreign body    Closed compression fracture of L1 vertebra ([place]) 05/24/2016   Lumbar stenosis with neurogenic claudication 11/01/2015   Chronic bilateral low back pain with right-sided sciatica 11/08/2014   Hx of hemorrhoids 11/08/2014   AAA (abdominal aortic aneurysm) ([place]) 09/22/2014   Chronic pain associated with significant psychosocial dysfunction 09/22/2014   Panic disorder 09/22/2014   AB (asthmatic bronchitis) 08/17/2014   Anxiety 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 08/17/2014   Backache 08/17/2014   Lumbosacral spondylosis without myelopathy 08/17/2014   Disorder of male genital organs 08/17/2014   Brash 08/17/2014   Low back pain 08/17/2014   Tendon nodule 08/17/2014   Episodic paroxysmal anxiety disorder 08/17/2014   Hernia, inguinal, right 08/17/2014   Fast heart beat 08/17/2014   Illness 08/17/2014   Inguinal hernia 10/14/2012    REFERRING DIAG: History of stroke with residual deficit, chronic bilateral low back pain with right-sided sciatica, Lumbosacral spondylosis without myelopathy THERAPY DIAG:  Muscle weakness (generalized)  Chronic bilateral low back pain without sciatica  Rationale for Evaluation and Treatment Rehabilitation  PERTINENT HISTORY: Pt. is a 51 y.o. who was diagnosed with  a CVA on July 21st, 2022. Pt. completed several weeks of inpatient rehab at [facility]. After returning to home. He was discharged in late August/early September and recieved home health PT. Pt. sustained a fall in december of 2022, and was admitted to the hospital with COVID-19, and Chronic back pain. He reports chronic back pain syndrome since 2012. Since the most recent discharge, Pt. has been residing with the ex-wife until the Pt. is ready to return to  independent living. He did recieve 2 weeks of home health after discharge in December 2022. He is now being referred to outpatient PT to address weakness from stroke and improve fine motor movement. He reports rarely getting numbness/tingling in RLE. PMHx includes: Bilateral LBP with right sided sciatica, Lumbosacral spondylosis myelopathy, closed compression Fx of L1. Has chronic insomnia. osteoarthritis, Panic anxiety syndrome (taking medication) Pt. enjoys cooking, and riding motorcycles. Patient is going to [facility] spine center for Epidural spinal injections on 06/07/21;  PRECAUTIONS: Fall, Spinal Brace wears daily, approximately 25%, when exercising; semi-rigid lumbar brace  SUBJECTIVE: Pt reports pain currently 2/10.  Pt has appt Oct 4th.  PAIN:  Are you having pain? Yes: NPRS scale: 2/10 Pain location: bilat low back Pain description: dull ache; always constant,  Aggravating factors: worse with sitting,  Relieving factors: heat helps temporarily, pain pills/pain patch   TODAY'S TREATMENT 01/18/22   TherEx-  On mat table: heat donned to low back while pt performs the following, skinned check and is WNL prior to and upon removal of heat.  On mat table:  LTRs 20x each LE Sidelye hip abduction 20x   10# weights: SLR 20x B. Rates medium March 20x alt LE LTRs 10x each side  Bridges 10x   Leg press: 85# 10x  100# 10x. Medium 115# 5x. Medium 120# 10x felt easier than previous weight  Stability ball FWD/BCKWD seated x multiple reps of each  Cable Machine: seated: RLE 7.5-12.5-17.5# - 5-10x per weight  LLE 17.5-22.5-27.5# - 5-10x per weight     PATIENT EDUCATION:  Education details: Pt educated throughout session about proper posture and technique with exercises. Improved exercise technique, movement at target joints, use of target muscles after min to mod verbal, visual, tactile cues.  Person educated: Patient Education method: Explanation, Demonstration, Tactile cues, and  Verbal cues Education comprehension: verbalized understanding, returned demonstration, verbal cues required, tactile cues required, and needs further education   HOME EXERCISE PROGRAM:   No updates on this date pt to continue HEP as previously given     PT Short Term Goals -       PT SHORT TERM GOAL #1   Title Patient will be adherent to HEP at least 3x a week to improve functional strength and balance for better safety at home.    Baseline 4/4: doing them 1-2x a week; 08/20/2021=Patient reports performing his low back stretching and no questions currently HEP. 10/24/21: doing HEP 4x a week   Time 4    Period Weeks    Status Achieved    Target Date 08/21/21      PT SHORT TERM GOAL #2   Title Patient (< 43 years old) will complete five times sit to stand test in < 15 seconds indicating an increased LE strength and improved balance.    Baseline 4/4: 17.85 sec with arms across chest; 08/20/2021= 17.6 sec , 7/5: 12.85 sec   Time 4    Period Weeks    Status Achieved;    Target Date 10/01/21  PT Long Term Goals -       PT LONG TERM GOAL #1   Title Patient will increase six minute walk test distance to >1000 for progression to community ambulator and improve gait ability    Baseline 4/4: 1115 feet , 7/5: 1080 feet;    Time 4    Period Weeks    Status Achieved    Target Date 08/21/21      PT LONG TERM GOAL #2   Title Patient will ascend/descend 8 stairs without rail assist independently without loss of balance to improve ability to get in/out of home.    Baseline 4/4: requires 1 rail assist; 08/20/2021- Patient demonstrated steps without railing using reciprocal steps today. 7/5: ascend/descend 6 steps reciprocally without rail, but required CGA for safety when descending; 7/25: Able to complete without UE support and good balance going up steps, unsafe descending steps without UE support. Required min assist and UE support to prevent LOB descending.; 8/15: requires UUE  support to descend and close CGA due to; unsteadiness; 9/26: ascend without UE support, recip pattern; descend with UUE support, recip stepping, but still doesn't feel steady yet with descending   Time 12*corrected   Period Weeks    Status Partially met   Target Date 02/05/2022 * corrected date     PT Mild[place] #3   Title Patient will increase BLE gross strength to 4+/5 as to improve functional strength for independent gait, increased standing tolerance and increased ADL ability.    Baseline 4/4: not formally assessed; 08/20/2021= 4+/5 with Right hip flex/knee ext/flex; Right hip abd= 4/5, 7/5: 4+/5 hip    Time 12    Period Weeks    Status Achieved;    Target Date 11/12/21      PT LONG TERM GOAL #4   Title Patient will improve FOTO score to >50% to indicate improved functional mobility with less pain with ADLs.    Baseline 4/4: 35%; 08/20/2021= 42% 7/5: 47%; 7:25:recently addressed; 11/30/21: 35; 9/26: 54%    Time 12    Period Weeks    Status MET   Target Date 02/05/2022      PT LONG TERM GOAL #5   Title Patient will report a worst pain of 4/10 in low back over last week to indicate improved tolerance with ADLs.    Baseline 4/4: 7/10; 08/20/2021= 4/10 , 7/5: worst 7/10 in low back; 7/25: 6/10 ; 8/15: pt reports 6/10; 9/26: 4/10   Time 12    Period Weeks    Status Achieved    Target Date 02/05/2022      Additional Long Term Goals   Additional Long Term Goals Yes      PT LONG TERM GOAL #6   Title Pt will increase 10MWT by at least 0.13 m/s in order to demonstrate clinically significant improvement in community ambulation    Baseline 08/20/2021=0.94 m/s 7/5: 1.5 m/s   Time 12    Period Weeks    Status Achieved;    Target Date 11/12/21      PT LONG TERM GOAL #7   Title Patient will demonstrate improved static standing balance as seen by ability to single leg stance on right LE > 12 sec consistently for optimal balance on level and unlevel surfaces.    Baseline 08/20/2021= 4 sec SLS  on right , 7/5: 4-5 sec inconsistent; 7/25 able to achieve 15 sec after multiple attempts of 4-5 sec holds; 8/15: LLE 15 sec, RLE 8 sec; 9/26:  LLE 30 sec, RLE 17 seconds   Time 12    Period Weeks    Status MET   Target Date 02/05/2022    PT LONG TERM GOAL #8  Title Pt will report ability to ambulate for at least 15 minutes without an increase in his LBP in order to improve community participation and ease with ADLs  Baseline 9/26: increase in pain at 15 min mark  Time 3   Period Weeks   Status NEW  Target Date 02/05/2022             Plan -     Clinical Impression Statement Pt highly motivated to participate in PT, and tolerates interventions well without increased pain from baseline. He continues to progress level of resistance used with interventions and demonstrates improved coordination of affected LE. While pt is making gains, he has not yet met his goals and still exhibits strength deficits. The pt will benefit from further skilled PT to improve strength and balance, and decrease pain levels.   Personal Factors and Comorbidities Comorbidity 3+;Past/Current Experience;Time since onset of injury/illness/exacerbation    Comorbidities PMHx includes: Bilateral LBP with right sided sciatica, Lumbosacral spondylosis myelopathy, closed compression Fx of L1. Has chronic insomnia. osteoarthritis, Panic anxiety syndrome (taking medication)    Examination-Activity Limitations Bend;Carry;Locomotion Level;Sit;Squat;Stairs;Stand;Transfers    Examination-Participation Restrictions Cleaning;Community Activity;Driving;Laundry;Meal Prep;Occupation;Shop;Volunteer;Yard Work    Stability/Clinical Decision Making Stable/Uncomplicated    Rehab Potential Good    PT Frequency 2x / week    PT Duration 12 weeks    PT Treatment/Interventions Cryotherapy;Electrical Stimulation;Moist Heat;Traction;Gait training;Stair training;Functional mobility training;Therapeutic activities;Therapeutic exercise;Balance  training;Neuromuscular re-education;Patient/family education;Manual techniques;Passive range of motion;Dry needling;Energy conservation    PT Next Visit Plan  Progress core stabilization, Manual therapy for low back and LE ROM, instruct in balance HEP, continue plan       Consulted and Agree with Plan of Care Patient              11:11 AM, 01/18/22   Zollie Pee, PT 01/18/2022, 11:11 AM  [facility] [facility] [address] [place], [place], [address] Phone: [phone]   Fax:  [phone]

## 2022-01-20 NOTE — Therapy (Signed)
OUTPATIENT OCCUPATIONAL THERAPY TREATMENT NOTE   Patient Name: David Reeves MRN: [id number] DOB:03/31/1971, 51 y.o., male Today's Date: 01/20/2022  PCP: [name], PA-C REFERRING PROVIDER: [name], PA-C  END OF SESSION:      OT End of Session - 01/20/22 1800     Visit Number 97    Number of Visits 44    Date for OT Re-Evaluation 01/25/22    Authorization Type Progress reporting period starting 12/11/21    OT Start Time 0931    OT Stop Time 1015    OT Time Calculation (min) 44 min    Activity Tolerance Patient tolerated treatment well    Behavior During Therapy [facility] for tasks assessed/performed               Past Medical History:  Diagnosis Date   Back pain 04/22/2012   Bone spur    Bulging disc 04/22/2012   Degenerative disc disease    Osteoarthritis    Panic anxiety syndrome    Stroke ([place]) 10/2020   Taking multiple medications for chronic disease    Past Surgical History:  Procedure Laterality Date   CYSTECTOMY     head   HERNIA REPAIR Left 2006,2014   [facility]   TOE SURGERY Right 2007   Patient Active Problem List   Diagnosis Date Noted   Alterations of sensations following cerebrovascular accident 03/08/2021   Aphasia as late effect of cerebrovascular accident (CVA) 03/08/2021   Muscle spasm 02/22/2021   Acute non-recurrent frontal sinusitis 02/22/2021   Chronic pain syndrome 01/23/2021   Primary hypertension 01/23/2021   Nerve pain 01/23/2021   Erectile dysfunction due to diseases classified elsewhere 01/23/2021   Cognitive dysfunction 01/23/2021   Scrotal edema 01/23/2021   Elevated LDL cholesterol level 01/23/2021   History of stroke with residual deficit 01/23/2021   Primary insomnia 01/23/2021   Mouth pain 01/23/2021   Laceration of right hand without foreign body    Closed compression fracture of L1 vertebra ([place]) 05/24/2016   Lumbar stenosis with neurogenic claudication 11/01/2015   Chronic bilateral low back pain with  right-sided sciatica 11/08/2014   Hx of hemorrhoids 11/08/2014   AAA (abdominal aortic aneurysm) ([place]) 09/22/2014   Chronic pain associated with significant psychosocial dysfunction 09/22/2014   Panic disorder 09/22/2014   AB (asthmatic bronchitis) 08/17/2014   Anxiety 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 08/17/2014   Backache 08/17/2014   Lumbosacral spondylosis without myelopathy 08/17/2014   Disorder of male genital organs 08/17/2014   Brash 08/17/2014   Low back pain 08/17/2014   Tendon nodule 08/17/2014   Episodic paroxysmal anxiety disorder 08/17/2014   Hernia, inguinal, right 08/17/2014   Fast heart beat 08/17/2014   Illness 08/17/2014   Inguinal hernia 10/14/2012    ONSET DATE: 11/09/2020  REFERRING DIAG: CVA  THERAPY DIAG:  Muscle weakness (generalized)  Other lack of coordination  H/O ischemic left MCA stroke  Rationale for Evaluation and Treatment Rehabilitation  PERTINENT HISTORY: Pt. is a 51 y.o. who was diagnosed with a CVA on July 21st, 2022. Pt. completed several weeks of  inpatient rehab at [facility]. After returning to home, Pt. sustained a fall in december of 2022, and was admitted to the hospital with COVI-19, and back pain from the fall. Since the most recent discharge, Pt. has been residing with the ex-wife until the Pt. is ready to return to independent living. Pt. PMHx includes: Bilateral LBP with right sided sciatica, Lumbosacral spondylosis myelopathy, closed compression Fx of L1. Pt. enjoys  cooking, and riding motorcycles.   PRECAUTIONS: Fall  SUBJECTIVE: Pt reports 2/10 pain in back.  Pain patch is still "kicking in." PAIN:  Are you having pain? Yes: NPRS scale: 2/10 Pain location: low back Pain description: aching Aggravating factors: prolonged sitting or lying down Relieving factors: rest, heat, walking   OBJECTIVE:  TODAY'S TREATMENT:  Therapeutic Exercise: Completed R hand active digit abd, active assisted add x2 sets 10 reps each.   Completed place and hold for R thumb radial abduction x10 each and resisted lateral and 3 point pinch pinching into foam piece.  Pt required manual assist from OT to maintain fingers in latera and 3 point pinch positions while pinching foam.  Completed passive stretch for R forearm sup/pron with active assisted R wrist flexion with a supinated forearm and closed fist using 1# dowel (performed bilaterally) for 3 sets 10 reps each.  Performed active assisted palmar abd/add in in prep for grasp/release activities noted below.  Neuro re-ed: Facilitation of R hand grasp and release patterns using 2 and 3 point pinch, working to pick up ball pegs from peg board.  Frequent rest breaks needed.  Initial assist from OT or pt using L hand to position fingers into 3 point pinch pattern on ball peg.  After a few reps of assist for finger placement, pt was able to carry over prehension pattern without assist from L hand.  PATIENT EDUCATION: Education details: HEP progression, grasp/release activities; wrist flex against gravity Person educated: Patient Education method: Explanation Education comprehension: verbalized and demonstrated understanding.      OT Short Term Goals -      OT SHORT TERM GOAL #1   Title Pt. wil demonstrtae independence with HEPs    Baseline Eval: Pt. currently does not have one, 10th visit:  changes in HEP ongoing; 20th: instructed in putty exercises, changes ongoing; 40th: continual updates to HEP as pt progresses; 50th: continual updates   Time 6    Period Weeks    Status On-going    Target Date 11/20/21             OT Long Term Goals -       OT LONG TERM GOAL #1   Title Pt. will improve RUE strength by 2 mm grades to assist with ADLs, and IADLs,    Baseline Eval: Right shoulder flexion, abduction 3/5, elbow flexion, extension wrist extension 3+/5, 10th visit: improving with RUE strength by not yet met goal; 20th visit: R shd flex/abd 4-, elbow flex/ext 4+, wrist flex 4-,  wrist ext 4+; 40:  R shd flex/abd 4,  elbow flex/ext 4+, wrist flex 4, wrist ext 4+; 50th visit 8/22: R shoulder flex/abd 4/5, elbow flex/ext 5/5, wrist flex 4, wrist ext 4+   Time 12    Period Weeks    Status On-going    Target Date 01/25/22      OT LONG TERM GOAL #2   Title Pt. will improve right grip by 10 lbs to prepare for firmly holding objects for IADLs.    Baseline Eval: R: 38#, L: 124# (In 3rd dynamometer slot); R 35# in 4th slot, 28# in 3rd slot, 40th: 47 in 4th slot; 50th: 12/11/21: 46# on 3rd slot   Time 12    Period Weeks    Status On-going    Target Date 01/25/22      OT LONG TERM GOAL #3   Title Pt. will improve right lateral pinch strength by 5# to assist with cutting food  Baseline Eval: R: 17, L: 25; 20th: R: 18; 40th: 15# fingers slipping on meter, 50th: 9# fingers slipping   Time 12    Period Weeks    Status On-going    Target Date 01/25/22      OT LONG TERM GOAL #4   Title Pt. will improve Right 3pt pinch by 2# to be able to hold/open items for cooking    Baseline Eval: R: Pt. unable to engage thumb L: 29#; 20th: R unable; 40th:  unable, fingers slipping; 50th:  unable, fingers slipping   Time 12    Period Weeks    Status On-going    Target Date 01/25/22      OT LONG TERM GOAL #5   Title Pt. will improve right hand [facility] skills to be able to independently manipulate buttons, and zippers.    Baseline Eval: Pt. has difficulty managing buttons,a nd zippers. 10th visit:  improving; 20th: unable to engage R hand effectively, performs 1 handed with L; 50th: performs L handed only   Time 12    Period Weeks    Status On-going    Target Date 01/25/22      OT LONG TERM GOAL #6   Title Pt. will independently write his name    Baseline Eval: Pt. is unable to hold a pen; 20th: pt can grip PenAgain with R hand and can make marks on paper with difficulty, but not yet writing name; 40th:  continues to demonstrate difficulty with grasp of pen; 50th 12/11/21: With assist to  set up hand on pen again, pt was able to write first name and initial of last name in 1 1/4" tall letters with fair legibility (pt wants to defer this goal, states this is low on his list of priorities for now)   Time 12    Period Weeks    Status Defer   Target Date 01/25/22      OT LONG TERM GOAL #7   Title Pt. will improve FOTO score by 2 points to reflect funational improvement    Baseline Eval: FOTO score 46 with TR score 52; 20th: 45; 40th: 30;  50th: 46   Time 12    Period Weeks    Status On-going    Target Date 01/25/22             Plan -    Clinical Impression Statement Good tolerance to tx activities this date with frequent rest breaks d/t RUE fatigue.  Facilitation of R hand grasp and release patterns using 2 and 3 point pinch, working to pick up ball pegs from peg board.  Initial assist from OT or pt using L hand to position fingers into 3 point pinch pattern on ball peg.  After a few reps of assist for finger placement, pt was able to carry over prehension pattern without assist from L hand.  Pt continues to be able to place and remove more pegs with fewer rest breaks this date, fewer dropped pegs and less slipping of the thumb during 2 and 3 point pinch patterns.  Pt continues to work on wrist flexion against gravity with a closed fist and supinated forearm, requiring active assist from OT to move past neutral d/t extensor tone.  Achieved this position with help from 1# dowel with bilat hands.  Pt was reminded to work on this at home as he stated he forgot about this exercise.  Pt will continue to work towards neuro re-ed of right hand with emphasis on  thumb movements to integrate into functional grasping and manipulation skills.    OT Occupational Profile and History Detailed Assessment- Review of Records and additional review of physical, cognitive, psychosocial history related to current functional performance    Occupational performance deficits (Please refer to evaluation for  details): ADL's;IADL's;Education    Rehab Potential Good    Clinical Decision Making Several treatment options, min-mod task modification necessary    Comorbidities Affecting Occupational Performance: May have comorbidities impacting occupational performance    Modification or Assistance to Complete Evaluation  Min-Moderate modification of tasks or assist with assess necessary to complete eval    OT Frequency 2x / week    OT Duration 12 weeks    OT Treatment/Interventions Self-care/ADL training;DME and/or AE instruction;Therapeutic exercise;Ultrasound;Neuromuscular education;Therapeutic activities;Energy conservation;Moist Heat;Patient/family education;Splinting;Functional Mobility Training;Paraffin    Plan Neuro re-ed and therapeutic exercise    Consulted and Agree with Plan of Care Patient             Leta Speller, MS, OTR/L   Darleene Cleaver, OT 01/20/2022, 6:02 PM

## 2022-01-22 ENCOUNTER — Encounter: Payer: Self-pay | Admitting: Physician Assistant

## 2022-01-22 ENCOUNTER — Encounter: Payer: Medicare HMO | Admitting: Speech Pathology

## 2022-01-22 ENCOUNTER — Ambulatory Visit: Payer: Medicare Other

## 2022-01-22 ENCOUNTER — Ambulatory Visit: Payer: Medicare Other | Attending: Physician Assistant

## 2022-01-22 ENCOUNTER — Other Ambulatory Visit: Payer: Self-pay | Admitting: Family Medicine

## 2022-01-22 DIAGNOSIS — R262 Difficulty in walking, not elsewhere classified: Secondary | ICD-10-CM | POA: Diagnosis present

## 2022-01-22 DIAGNOSIS — R278 Other lack of coordination: Secondary | ICD-10-CM

## 2022-01-22 DIAGNOSIS — M545 Low back pain, unspecified: Secondary | ICD-10-CM | POA: Diagnosis present

## 2022-01-22 DIAGNOSIS — I6930 Unspecified sequelae of cerebral infarction: Secondary | ICD-10-CM

## 2022-01-22 DIAGNOSIS — Z8673 Personal history of transient ischemic attack (TIA), and cerebral infarction without residual deficits: Secondary | ICD-10-CM

## 2022-01-22 DIAGNOSIS — R2681 Unsteadiness on feet: Secondary | ICD-10-CM | POA: Insufficient documentation

## 2022-01-22 DIAGNOSIS — G8929 Other chronic pain: Secondary | ICD-10-CM | POA: Insufficient documentation

## 2022-01-22 DIAGNOSIS — M6281 Muscle weakness (generalized): Secondary | ICD-10-CM | POA: Diagnosis present

## 2022-01-22 DIAGNOSIS — R269 Unspecified abnormalities of gait and mobility: Secondary | ICD-10-CM | POA: Insufficient documentation

## 2022-01-22 NOTE — Therapy (Unsigned)
OUTPATIENT OCCUPATIONAL THERAPY TREATMENT NOTE   Patient Name: David Reeves MRN: [id number] DOB:1971-01-20, 51 y.o., male Today's Date: 01/22/2022  PCP: [name], PA-C REFERRING PROVIDER: [name], PA-C  END OF SESSION:          Past Medical History:  Diagnosis Date   Back pain 04/22/2012   Bone spur    Bulging disc 04/22/2012   Degenerative disc disease    Osteoarthritis    Panic anxiety syndrome    Stroke ([place]) 10/2020   Taking multiple medications for chronic disease    Past Surgical History:  Procedure Laterality Date   CYSTECTOMY     head   HERNIA REPAIR Left 2006,2014   [facility]   TOE SURGERY Right 2007   Patient Active Problem List   Diagnosis Date Noted   Alterations of sensations following cerebrovascular accident 03/08/2021   Aphasia as late effect of cerebrovascular accident (CVA) 03/08/2021   Muscle spasm 02/22/2021   Acute non-recurrent frontal sinusitis 02/22/2021   Chronic pain syndrome 01/23/2021   Primary hypertension 01/23/2021   Nerve pain 01/23/2021   Erectile dysfunction due to diseases classified elsewhere 01/23/2021   Cognitive dysfunction 01/23/2021   Scrotal edema 01/23/2021   Elevated LDL cholesterol level 01/23/2021   History of stroke with residual deficit 01/23/2021   Primary insomnia 01/23/2021   Mouth pain 01/23/2021   Laceration of right hand without foreign body    Closed compression fracture of L1 vertebra ([place]) 05/24/2016   Lumbar stenosis with neurogenic claudication 11/01/2015   Chronic bilateral low back pain with right-sided sciatica 11/08/2014   Hx of hemorrhoids 11/08/2014   AAA (abdominal aortic aneurysm) ([place]) 09/22/2014   Chronic pain associated with significant psychosocial dysfunction 09/22/2014   Panic disorder 09/22/2014   AB (asthmatic bronchitis) 08/17/2014   Anxiety 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 08/17/2014   Backache 08/17/2014   Lumbosacral spondylosis without myelopathy  08/17/2014   Disorder of male genital organs 08/17/2014   Brash 08/17/2014   Low back pain 08/17/2014   Tendon nodule 08/17/2014   Episodic paroxysmal anxiety disorder 08/17/2014   Hernia, inguinal, right 08/17/2014   Fast heart beat 08/17/2014   Illness 08/17/2014   Inguinal hernia 10/14/2012    ONSET DATE: 11/09/2020  REFERRING DIAG: CVA  THERAPY DIAG:  No diagnosis found.  Rationale for Evaluation and Treatment Rehabilitation  PERTINENT HISTORY: Pt. is a 51 y.o. who was diagnosed with a CVA on July 21st, 2022. Pt. completed several weeks of  inpatient rehab at [facility]. After returning to home, Pt. sustained a fall in december of 2022, and was admitted to the hospital with COVI-19, and back pain from the fall. Since the most recent discharge, Pt. has been residing with the ex-wife until the Pt. is ready to return to independent living. Pt. PMHx includes: Bilateral LBP with right sided sciatica, Lumbosacral spondylosis myelopathy, closed compression Fx of L1. Pt. enjoys cooking, and riding motorcycles.   PRECAUTIONS: Fall  SUBJECTIVE: Pt reports 2.5/10 pain in back.  Pain patch is still "kicking in." PAIN:  Are you having pain? Yes: NPRS scale: 2/10 Pain location: low back Pain description: aching Aggravating factors: prolonged sitting or lying down Relieving factors: rest, heat, walking  [facility] OT Assessment          01/22/22            Observation/Other Assessments     Focus on Therapeutic Outcomes (FOTO)   46 50  Coordination     Right 9 Hole Peg Test unable      Left 9 Hole Peg Test 30 sec              Hand Function     Right Hand Grip (lbs) 35 (3rd setting) 30 (3rd);  R 41 L 96 (4th setting), R 4th     Right Hand Lateral Pinch 9 lbs   slipping 12    Right Hand 3 Point Pinch   unable to position thumb to produce 3 pt pinch Unable to position thumb to produce 3 point pinch    Left Hand Grip (lbs) 116      Left Hand Lateral Pinch 29 lbs      Left 3 point pinch 27  lbs        OBJECTIVE:  TODAY'S TREATMENT:  Therapeutic Exercise: Completed R hand active digit abd, active assisted add x2 sets 10 reps each.  Completed place and hold for R thumb radial abduction x10 each and resisted lateral and 3 point pinch pinching into foam piece.  Pt required manual assist from OT to maintain fingers in latera and 3 point pinch positions while pinching foam.  Completed passive stretch for R forearm sup/pron with active assisted R wrist flexion with a supinated forearm and closed fist using 1# dowel (performed bilaterally) for 3 sets 10 reps each.  Performed active assisted palmar abd/add in in prep for grasp/release activities noted below.  Neuro re-ed: Facilitation of R hand grasp and release patterns using 2 and 3 point pinch, working to pick up ball pegs from peg board.  Frequent rest breaks needed.  Initial assist from OT or pt using L hand to position fingers into 3 point pinch pattern on ball peg.  After a few reps of assist for finger placement, pt was able to carry over prehension pattern without assist from L hand.  PATIENT EDUCATION: Education details: HEP progression, grasp/release activities; wrist flex against gravity Person educated: Patient Education method: Explanation Education comprehension: verbalized and demonstrated understanding.      OT Short Term Goals -      OT SHORT TERM GOAL #1   Title Pt. wil demonstrtae independence with HEPs    Baseline Eval: Pt. currently does not have one, 10th visit:  changes in HEP ongoing; 20th: instructed in putty exercises, changes ongoing; 40th: continual updates to HEP as pt progresses; 50th: continual updates   Time 6    Period Weeks    Status On-going    Target Date 11/20/21             OT Long Term Goals -       OT LONG TERM GOAL #1   Title Pt. will improve RUE strength by 2 mm grades to assist with ADLs, and IADLs,    Baseline Eval: Right shoulder flexion, abduction 3/5, elbow flexion,  extension wrist extension 3+/5, 10th visit: improving with RUE strength by not yet met goal; 20th visit: R shd flex/abd 4-, elbow flex/ext 4+, wrist flex 4-, wrist ext 4+; 40:  R shd flex/abd 4,  elbow flex/ext 4+, wrist flex 4, wrist ext 4+; 50th visit 8/22: R shoulder flex/abd 4/5, elbow flex/ext 5/5, wrist flex 4, wrist ext 4+; 01/22/22: shoulder flex/abd 4/5, elbow flex/ext 5/5, wrist flex 4, wrist ext 4+,    Time 12    Period Weeks    Status On-going    Target Date 01/25/22      OT LONG TERM GOAL #2  Title Pt. will improve right grip by 10 lbs to prepare for firmly holding objects for IADLs.    Baseline Eval: R: 38#, L: 124# (In 3rd dynamometer slot); R 35# in 4th slot, 28# in 3rd slot, 40th: 47 in 4th slot; 50th: 12/11/21: 46# on 3rd slot   Time 12    Period Weeks    Status On-going    Target Date 01/25/22      OT LONG TERM GOAL #3   Title Pt. will improve right lateral pinch strength by 5# to assist with cutting food    Baseline Eval: R: 17, L: 25; 20th: R: 18; 40th: 15# fingers slipping on meter, 50th: 9# fingers slipping   Time 12    Period Weeks    Status On-going    Target Date 01/25/22      OT LONG TERM GOAL #4   Title Pt. will improve Right 3pt pinch by 2# to be able to hold/open items for cooking    Baseline Eval: R: Pt. unable to engage thumb L: 29#; 20th: R unable; 40th:  unable, fingers slipping; 50th:  unable, fingers slipping   Time 12    Period Weeks    Status On-going    Target Date 01/25/22      OT LONG TERM GOAL #5   Title Pt. will improve right hand [facility] skills to be able to independently manipulate buttons, and zippers.    Baseline Eval: Pt. has difficulty managing buttons,a nd zippers. 10th visit:  improving; 20th: unable to engage R hand effectively, performs 1 handed with L; 50th: performs L handed only   Time 12    Period Weeks    Status On-going    Target Date 01/25/22      OT LONG TERM GOAL #6   Title Pt. will independently write his name     Baseline Eval: Pt. is unable to hold a pen; 20th: pt can grip PenAgain with R hand and can make marks on paper with difficulty, but not yet writing name; 40th:  continues to demonstrate difficulty with grasp of pen; 50th 12/11/21: With assist to set up hand on pen again, pt was able to write first name and initial of last name in 1 1/4" tall letters with fair legibility (pt wants to defer this goal, states this is low on his list of priorities for now)   Time 12    Period Weeks    Status Defer   Target Date 01/25/22      OT LONG TERM GOAL #7   Title Pt. will improve FOTO score by 2 points to reflect funational improvement    Baseline Eval: FOTO score 46 with TR score 52; 20th: 45; 40th: 71;  50th: 46   Time 12    Period Weeks    Status On-going    Target Date 01/25/22             Plan -    Clinical Impression Statement Good tolerance to tx activities this date with frequent rest breaks d/t RUE fatigue.  Facilitation of R hand grasp and release patterns using 2 and 3 point pinch, working to pick up ball pegs from peg board.  Initial assist from OT or pt using L hand to position fingers into 3 point pinch pattern on ball peg.  After a few reps of assist for finger placement, pt was able to carry over prehension pattern without assist from L hand.  Pt continues to be able to place  and remove more pegs with fewer rest breaks this date, fewer dropped pegs and less slipping of the thumb during 2 and 3 point pinch patterns.  Pt continues to work on wrist flexion against gravity with a closed fist and supinated forearm, requiring active assist from OT to move past neutral d/t extensor tone.  Achieved this position with help from 1# dowel with bilat hands.  Pt was reminded to work on this at home as he stated he forgot about this exercise.  Pt will continue to work towards neuro re-ed of right hand with emphasis on thumb movements to integrate into functional grasping and manipulation skills.    OT  Occupational Profile and History Detailed Assessment- Review of Records and additional review of physical, cognitive, psychosocial history related to current functional performance    Occupational performance deficits (Please refer to evaluation for details): ADL's;IADL's;Education    Rehab Potential Good    Clinical Decision Making Several treatment options, min-mod task modification necessary    Comorbidities Affecting Occupational Performance: May have comorbidities impacting occupational performance    Modification or Assistance to Complete Evaluation  Min-Moderate modification of tasks or assist with assess necessary to complete eval    OT Frequency 2x / week    OT Duration 12 weeks    OT Treatment/Interventions Self-care/ADL training;DME and/or AE instruction;Therapeutic exercise;Ultrasound;Neuromuscular education;Therapeutic activities;Energy conservation;Moist Heat;Patient/family education;Splinting;Functional Mobility Training;Paraffin    Plan Neuro re-ed and therapeutic exercise    Consulted and Agree with Plan of Care Patient             Leta Speller, MS, OTR/L   Darleene Cleaver, OT 01/22/2022, 9:25 AM

## 2022-01-24 ENCOUNTER — Other Ambulatory Visit: Payer: Self-pay | Admitting: Family Medicine

## 2022-01-24 NOTE — Telephone Encounter (Signed)
Requested medication (s) are due for refill today: no  Requested medication (s) are on the active medication list: yes  Last refill:  01/23/21, discontinued  Future visit scheduled: yes  Notes to clinic:  Unable to refill per protocol, cannot delegate.  Medication was discontinued 01/23/21, unable to refuse medication.     Requested Prescriptions  Pending Prescriptions Disp Refills   tiZANidine (ZANAFLEX) 4 MG tablet [Pharmacy Med Name: TIZANIDINE 4MG  TABLETS] 90 tablet 3    Sig: TAKE 1 TABLET(4 MG) BY MOUTH THREE TIMES DAILY     Not Delegated - Cardiovascular:  Alpha-2 [facility]onists - tizanidine Failed - 01/24/2022  2:18 PM      Failed - This refill cannot be delegated      Passed - Valid encounter within last 6 months    Recent Outpatient Visits           4 weeks ago Primary hypertension   [facility] Thedore Mins, [place], PA-C   5 months ago History of stroke with residual deficit   [facility] [place], [place], PA-C   8 months ago Alterations of sensations following cerebrovascular accident   [facility] Thedore Mins, [facility], PA-C   10 months ago Alterations of sensations following cerebrovascular accident   [facility] Thedore Mins, [place], PA-C   11 months ago Muscle spasm   [facility] Gwyneth Sprout, [place]

## 2022-01-24 NOTE — Therapy (Signed)
[place] [facility] SERVICES [address] [place], [place], [address] Phone: [phone]   Fax:  [phone]  Patient Details  Name: David Reeves MRN: [id number] Date of Birth: 1970-08-04 Referring Provider:  [name], PA-C  Encounter Date: 01/25/2022   OUTPATIENT PHYSICAL THERAPY TREATMENT NOTE       Patient Name: David Reeves MRN: [id number] DOB:02/11/1971, 51 y.o., male Today's Date: 01/25/2022  PCP: [name], PA-C REFERRING PROVIDER: [name], PA-C   PT End of Session - 01/25/22 1013     Visit Number 84    Number of Visits 85    Date for PT Re-Evaluation 02/05/22    Authorization Type [employer]    Progress Note Due on Visit 46    PT Start Time 1016    PT Stop Time 1102    PT Time Calculation (min) 46 min    Equipment Utilized During Treatment Gait belt    Activity Tolerance Patient tolerated treatment well;No increased pain    Behavior During Therapy [facility] for tasks assessed/performed                           Past Medical History:  Diagnosis Date   Back pain 04/22/2012   Bone spur    Bulging disc 04/22/2012   Degenerative disc disease    Osteoarthritis    Panic anxiety syndrome    Stroke ([place]) 10/2020   Taking multiple medications for chronic disease    Past Surgical History:  Procedure Laterality Date   CYSTECTOMY     head   HERNIA REPAIR Left 2006,2014   [facility]   TOE SURGERY Right 2007   Patient Active Problem List   Diagnosis Date Noted   Alterations of sensations following cerebrovascular accident 03/08/2021   Aphasia as late effect of cerebrovascular accident (CVA) 03/08/2021   Muscle spasm 02/22/2021   Acute non-recurrent frontal sinusitis 02/22/2021   Chronic pain syndrome 01/23/2021   Primary hypertension 01/23/2021   Nerve pain 01/23/2021   Erectile dysfunction due to diseases classified elsewhere 01/23/2021   Cognitive dysfunction  01/23/2021   Scrotal edema 01/23/2021   Elevated LDL cholesterol level 01/23/2021   History of stroke with residual deficit 01/23/2021   Primary insomnia 01/23/2021   Mouth pain 01/23/2021   Laceration of right hand without foreign body    Closed compression fracture of L1 vertebra ([place]) 05/24/2016   Lumbar stenosis with neurogenic claudication 11/01/2015   Chronic bilateral low back pain with right-sided sciatica 11/08/2014   Hx of hemorrhoids 11/08/2014   AAA (abdominal aortic aneurysm) ([place]) 09/22/2014   Chronic pain associated with significant psychosocial dysfunction 09/22/2014   Panic disorder 09/22/2014   AB (asthmatic bronchitis) 08/17/2014   Anxiety 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 08/17/2014   Backache 08/17/2014   Lumbosacral spondylosis without myelopathy 08/17/2014   Disorder of male genital organs 08/17/2014   Brash 08/17/2014   Low back pain 08/17/2014   Tendon nodule 08/17/2014   Episodic paroxysmal anxiety disorder 08/17/2014   Hernia, inguinal, right 08/17/2014   Fast heart beat 08/17/2014   Illness 08/17/2014   Inguinal hernia 10/14/2012    REFERRING DIAG: History of stroke with residual deficit, chronic bilateral low back pain with right-sided sciatica, Lumbosacral spondylosis without myelopathy THERAPY DIAG:  Muscle weakness (generalized)  Chronic bilateral low back pain without sciatica  Other lack of coordination  Rationale for Evaluation and Treatment Rehabilitation  PERTINENT HISTORY: Pt. is a  51 y.o. who was diagnosed with a CVA on July 21st, 2022. Pt. completed several weeks of inpatient rehab at [facility]. After returning to home. He was discharged in late August/early September and recieved home health PT. Pt. sustained a fall in december of 2022, and was admitted to the hospital with COVID-19, and Chronic back pain. He reports chronic back pain syndrome since 2012. Since the most recent discharge, Pt. has been residing with the ex-wife until the  Pt. is ready to return to independent living. He did recieve 2 weeks of home health after discharge in December 2022. He is now being referred to outpatient PT to address weakness from stroke and improve fine motor movement. He reports rarely getting numbness/tingling in RLE. PMHx includes: Bilateral LBP with right sided sciatica, Lumbosacral spondylosis myelopathy, closed compression Fx of L1. Has chronic insomnia. osteoarthritis, Panic anxiety syndrome (taking medication) Pt. enjoys cooking, and riding motorcycles. Patient is going to [facility] spine center for Epidural spinal injections on 06/07/21;  PRECAUTIONS: Fall, Spinal Brace wears daily, approximately 25%, when exercising; semi-rigid lumbar brace  SUBJECTIVE: Pt reports he has had some medication changes from pain doctor and that this has helped. Pt reports pain level is currently a 1/10. Balance has been fine.  PAIN:  Are you having pain? Yes: NPRS scale: 1/10 Pain location: bilat low back Pain description: dull ache; always constant,  Aggravating factors: worse with sitting,  Relieving factors: heat helps temporarily, pain pills/pain patch   TODAY'S TREATMENT 01/25/22   TherEx-  On mat table  BLE on stability ball - hamstring curls 20x LTRs 20x each side [place]e hip abduction with 2# weights 20x each LE . Pt reports only difficulty is with coordination but not so much with ankle weights.  12# weights donned each LE: SLR 15x each LE. Rates medium. Pt states, "I thought it would be much harder. It's fairly easy." March 15x each LE  LTRs 10x each side  Bridges on p.ball with hamstring curl 15x   Leg press: 85# 5x  115# 10x. Rates easy 130# 10x  135# 10x   Cable Machine: seated: RLE: 7.5# 10x  12.5# 6x 17.5# 6x 22.5# 6x. Medium.  LLE: 12.5# 6x 22.5# 6x 27.5# 10x   Pt reports no pain with interventions.    PATIENT EDUCATION:  Education details: Pt educated throughout session about proper posture and technique  with exercises. Improved exercise technique, movement at target joints, use of target muscles after min to mod verbal, visual, tactile cues.  Person educated: Patient Education method: Explanation, Demonstration, Tactile cues, and Verbal cues Education comprehension: verbalized understanding, returned demonstration, verbal cues required, tactile cues required, and needs further education   HOME EXERCISE PROGRAM:   No updates on this date pt to continue HEP as previously given     PT Short Term Goals -       PT SHORT TERM GOAL #1   Title Patient will be adherent to HEP at least 3x a week to improve functional strength and balance for better safety at home.    Baseline 4/4: doing them 1-2x a week; 08/20/2021=Patient reports performing his low back stretching and no questions currently HEP. 10/24/21: doing HEP 4x a week   Time 4    Period Weeks    Status Achieved    Target Date 08/21/21      PT SHORT TERM GOAL #2   Title Patient (< 67 years old) will complete five times sit to stand test in < 15 seconds indicating  an increased LE strength and improved balance.    Baseline 4/4: 17.85 sec with arms across chest; 08/20/2021= 17.6 sec , 7/5: 12.85 sec   Time 4    Period Weeks    Status Achieved;    Target Date 10/01/21              PT Long Term Goals -       PT LONG TERM GOAL #1   Title Patient will increase six minute walk test distance to >1000 for progression to community ambulator and improve gait ability    Baseline 4/4: 1115 feet , 7/5: 1080 feet;    Time 4    Period Weeks    Status Achieved    Target Date 08/21/21      PT LONG TERM GOAL #2   Title Patient will ascend/descend 8 stairs without rail assist independently without loss of balance to improve ability to get in/out of home.    Baseline 4/4: requires 1 rail assist; 08/20/2021- Patient demonstrated steps without railing using reciprocal steps today. 7/5: ascend/descend 6 steps reciprocally without rail, but required  CGA for safety when descending; 7/25: Able to complete without UE support and good balance going up steps, unsafe descending steps without UE support. Required min assist and UE support to prevent LOB descending.; 8/15: requires UUE support to descend and close CGA due to; unsteadiness; 9/26: ascend without UE support, recip pattern; descend with UUE support, recip stepping, but still doesn't feel steady yet with descending   Time 12*corrected   Period Weeks    Status Partially met   Target Date 02/05/2022 * corrected date     PT [place] #3   Title Patient will increase BLE gross strength to 4+/5 as to improve functional strength for independent gait, increased standing tolerance and increased ADL ability.    Baseline 4/4: not formally assessed; 08/20/2021= 4+/5 with Right hip flex/knee ext/flex; Right hip abd= 4/5, 7/5: 4+/5 hip    Time 12    Period Weeks    Status Achieved;    Target Date 11/12/21      PT LONG TERM GOAL #4   Title Patient will improve FOTO score to >50% to indicate improved functional mobility with less pain with ADLs.    Baseline 4/4: 35%; 08/20/2021= 42% 7/5: 47%; 7:25:recently addressed; 11/30/21: 35; 9/26: 54%    Time 12    Period Weeks    Status MET   Target Date 02/05/2022      PT LONG TERM GOAL #5   Title Patient will report a worst pain of 4/10 in low back over last week to indicate improved tolerance with ADLs.    Baseline 4/4: 7/10; 08/20/2021= 4/10 , 7/5: worst 7/10 in low back; 7/25: 6/10 ; 8/15: pt reports 6/10; 9/26: 4/10   Time 12    Period Weeks    Status Achieved    Target Date 02/05/2022      Additional Long Term Goals   Additional Long Term Goals Yes      PT LONG TERM GOAL #6   Title Pt will increase 10MWT by at least 0.13 m/s in order to demonstrate clinically significant improvement in community ambulation    Baseline 08/20/2021=0.94 m/s 7/5: 1.5 m/s   Time 12    Period Weeks    Status Achieved;    Target Date 11/12/21      PT LONG TERM  GOAL #7   Title Patient will demonstrate improved static standing balance as  seen by ability to single leg stance on right LE > 12 sec consistently for optimal balance on level and unlevel surfaces.    Baseline 08/20/2021= 4 sec SLS on right , 7/5: 4-5 sec inconsistent; 7/25 able to achieve 15 sec after multiple attempts of 4-5 sec holds; 8/15: LLE 15 sec, RLE 8 sec; 9/26: LLE 30 sec, RLE 17 seconds   Time 12    Period Weeks    Status MET   Target Date 02/05/2022    PT LONG TERM GOAL #8  Title Pt will report ability to ambulate for at least 15 minutes without an increase in his LBP in order to improve community participation and ease with ADLs  Baseline 9/26: increase in pain at 15 min mark  Time 3   Period Weeks   Status NEW  Target Date 02/05/2022             Plan -     Clinical Impression Statement Pt continues to show improvement with pain levels and strength, by more frequently reporting 1/10 LBP and by increasing level of resistance used with interventions. While pt shows progress, he has not yet met therapy goals, and still exhibits greater RLE strength deficits and impaired coordination. The pt will benefit from further skilled PT to improve strength and balance, and decrease pain levels.   Personal Factors and Comorbidities Comorbidity 3+;Past/Current Experience;Time since onset of injury/illness/exacerbation    Comorbidities PMHx includes: Bilateral LBP with right sided sciatica, Lumbosacral spondylosis myelopathy, closed compression Fx of L1. Has chronic insomnia. osteoarthritis, Panic anxiety syndrome (taking medication)    Examination-Activity Limitations Bend;Carry;Locomotion Level;Sit;Squat;Stairs;Stand;Transfers    Examination-Participation Restrictions Cleaning;Community Activity;Driving;Laundry;Meal Prep;Occupation;Shop;Volunteer;Yard Work    Stability/Clinical Decision Making Stable/Uncomplicated    Rehab Potential Good    PT Frequency 2x / week    PT Duration 12 weeks     PT Treatment/Interventions Cryotherapy;Electrical Stimulation;Moist Heat;Traction;Gait training;Stair training;Functional mobility training;Therapeutic activities;Therapeutic exercise;Balance training;Neuromuscular re-education;Patient/family education;Manual techniques;Passive range of motion;Dry needling;Energy conservation    PT Next Visit Plan  Progress core stabilization, Manual therapy for low back and LE ROM, instruct in balance HEP, continue plan       Consulted and Agree with Plan of Care Patient              3:17 PM, 01/25/22   Zollie Pee, PT 01/25/2022, 3:17 PM  [place] [facility] SERVICES [address] [place], [place], [address] Phone: [phone]   Fax:  [phone]

## 2022-01-25 ENCOUNTER — Ambulatory Visit: Payer: Medicare Other

## 2022-01-25 ENCOUNTER — Encounter: Payer: Medicare HMO | Admitting: Speech Pathology

## 2022-01-25 DIAGNOSIS — M545 Low back pain, unspecified: Secondary | ICD-10-CM

## 2022-01-25 DIAGNOSIS — M6281 Muscle weakness (generalized): Secondary | ICD-10-CM | POA: Diagnosis not present

## 2022-01-25 DIAGNOSIS — Z8673 Personal history of transient ischemic attack (TIA), and cerebral infarction without residual deficits: Secondary | ICD-10-CM

## 2022-01-25 DIAGNOSIS — R278 Other lack of coordination: Secondary | ICD-10-CM

## 2022-01-25 DIAGNOSIS — G8929 Other chronic pain: Secondary | ICD-10-CM

## 2022-01-25 NOTE — Therapy (Signed)
OUTPATIENT OCCUPATIONAL THERAPY NEURO TREATMENT NOTE   Patient Name: David Reeves MRN: [id number] DOB:1971-03-19, 51 y.o., male Today's Date: 01/25/2022  PCP: [name], PA-C REFERRING PROVIDER: [name], PA-C   OT End of Session - 01/25/22 1446     Visit Number 61    Number of Visits 70    Date for OT Re-Evaluation 04/16/22    Authorization Type Progress reporting period starting 12/11/21    OT Start Time 0930    OT Stop Time 1015    OT Time Calculation (min) 45 min    Activity Tolerance Patient tolerated treatment well    Behavior During Therapy [facility] for tasks assessed/performed               Past Medical History:  Diagnosis Date   Back pain 04/22/2012   Bone spur    Bulging disc 04/22/2012   Degenerative disc disease    Osteoarthritis    Panic anxiety syndrome    Stroke ([place]) 10/2020   Taking multiple medications for chronic disease    Past Surgical History:  Procedure Laterality Date   CYSTECTOMY     head   HERNIA REPAIR Left 2006,2014   [facility]   TOE SURGERY Right 2007   Patient Active Problem List   Diagnosis Date Noted   Alterations of sensations following cerebrovascular accident 03/08/2021   Aphasia as late effect of cerebrovascular accident (CVA) 03/08/2021   Muscle spasm 02/22/2021   Acute non-recurrent frontal sinusitis 02/22/2021   Chronic pain syndrome 01/23/2021   Primary hypertension 01/23/2021   Nerve pain 01/23/2021   Erectile dysfunction due to diseases classified elsewhere 01/23/2021   Cognitive dysfunction 01/23/2021   Scrotal edema 01/23/2021   Elevated LDL cholesterol level 01/23/2021   History of stroke with residual deficit 01/23/2021   Primary insomnia 01/23/2021   Mouth pain 01/23/2021   Laceration of right hand without foreign body    Closed compression fracture of L1 vertebra ([place]) 05/24/2016   Lumbar stenosis with neurogenic claudication 11/01/2015   Chronic bilateral low back pain with right-sided sciatica  11/08/2014   Hx of hemorrhoids 11/08/2014   AAA (abdominal aortic aneurysm) ([place]) 09/22/2014   Chronic pain associated with significant psychosocial dysfunction 09/22/2014   Panic disorder 09/22/2014   AB (asthmatic bronchitis) 08/17/2014   Anxiety 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 08/17/2014   Backache 08/17/2014   Lumbosacral spondylosis without myelopathy 08/17/2014   Disorder of male genital organs 08/17/2014   Brash 08/17/2014   Low back pain 08/17/2014   Tendon nodule 08/17/2014   Episodic paroxysmal anxiety disorder 08/17/2014   Hernia, inguinal, right 08/17/2014   Fast heart beat 08/17/2014   Illness 08/17/2014   Inguinal hernia 10/14/2012    ONSET DATE: 11/09/2020  REFERRING DIAG: CVA  THERAPY DIAG:  Muscle weakness (generalized)  Other lack of coordination  H/O ischemic left MCA stroke  Rationale for Evaluation and Treatment Rehabilitation  PERTINENT HISTORY: Pt. is a 51 y.o. who was diagnosed with a CVA on July 21st, 2022. Pt. completed several weeks of  inpatient rehab at [facility]. After returning to home, Pt. sustained a fall in december of 2022, and was admitted to the hospital with COVI-19, and back pain from the fall. Since the most recent discharge, Pt. has been residing with the ex-wife until the Pt. is ready to return to independent living. Pt. PMHx includes: Bilateral LBP with right sided sciatica, Lumbosacral spondylosis myelopathy, closed compression Fx of L1. Pt. enjoys cooking, and riding motorcycles.  PRECAUTIONS: Fall  SUBJECTIVE: Pt reports doing well today.  Back is feeling good.   PAIN:  Are you having pain? Yes: NPRS scale: 1/10 Pain location: low back Pain description: aching Aggravating factors: prolonged sitting or lying down Relieving factors: rest, heat, walking  [facility] OT Assessment        12/11/21 01/22/22            Observation/Other Assessments     Focus on Therapeutic Outcomes (FOTO)   46 50            Coordination     Right  9 Hole Peg Test unable  unable    Left 9 Hole Peg Test 30 sec  30 sec            Hand Function     Right Hand Grip (lbs) 35 (3rd setting) 30 lb (3rd);  41 lb (4th setting),     Right Hand Lateral Pinch 9 lbs   slipping 12 lbs (thumb slips)    Right Hand 3 Point Pinch   unable to position thumb to produce 3 pt pinch Unable to position thumb to produce 3 point pinch    Left Hand Grip (lbs) 116      Left Hand Lateral Pinch 29 lbs      Left 3 point pinch 27 lbs        OBJECTIVE:  TODAY'S TREATMENT:  Therapeutic Exercise: Completed passive stretch for R forearm sup/pron with active assisted R wrist flexion with a supinated forearm and closed fist using 1# dowel (performed bilaterally) for 3 sets 10 reps each.  Completed pron/sup with 2# weight for 3 sets 10 each; pt required min-mod A past neutral supination d/t limits of spasticity.  With 1# weight, pt performed R wrist flexion gravity assisted, x3 sets 10 reps each; pt moves just past neutral wrist flexion with effort d/t extensor tone, and R grip loosens around dumbbell.    Neuro re-ed: Facilitated 3 point pinch prehension patterns clipping and unclipping yellow resistive pins from dowel.  Pt required Ot's hand over hand assist to maintain fingers in 3 point pinch pattern to prevent slipping. Facilitation of R hand grasp and release patterns using 2 and 3 point pinch, working to pick up ball pegs from peg board.  Frequent rest breaks needed.  Initial assist from OT or pt using L hand to position fingers into 3 point pinch pattern on ball peg.  After a few reps of assist for finger placement, pt was able to carry over prehension pattern without assist from L hand.  Practiced using increased pressure to hold ball peg in a 3 point pinch pattern, with OT stabilizing fingers around peg to prevent slipping.  PATIENT EDUCATION: Education details: HEP progression, grasp/release activities; wrist flex against gravity Person educated: Patient Education  method: Explanation Education comprehension: verbalized and demonstrated understanding.      OT Short Term Goals -      OT SHORT TERM GOAL #1   Title Pt. wil demonstrtae independence with HEPs    Baseline Eval: Pt. currently does not have one, 10th visit:  changes in HEP ongoing; 20th: instructed in putty exercises, changes ongoing; 40th: continual updates to HEP as pt progresses; 50th: continual updates; 01/22/22: continual updates   Time 6    Period Weeks    Status On-going    Target Date 03/05/22             OT Long Term Goals -  OT LONG TERM GOAL #1   Title Pt. will improve RUE strength by 2 mm grades to assist with ADLs, and IADLs,    Baseline Eval: Right shoulder flexion, abduction 3/5, elbow flexion, extension wrist extension 3+/5, 10th visit: improving with RUE strength by not yet met goal; 20th visit: R shd flex/abd 4-, elbow flex/ext 4+, wrist flex 4-, wrist ext 4+; 40:  R shd flex/abd 4,  elbow flex/ext 4+, wrist flex 4, wrist ext 4+; 50th visit 8/22: R shoulder flex/abd 4/5, elbow flex/ext 5/5, wrist flex 4, wrist ext 4+; 01/22/22: shoulder flex/abd 4/5, elbow flex/ext 5/5, wrist flex 4, wrist ext 4+,    Time 12    Period Weeks    Status On-going    Target Date 04/16/22      OT LONG TERM GOAL #2   Title Pt. will improve right grip by 10 lbs to prepare for firmly holding objects for IADLs.    Baseline Eval: R: 38#, L: 124# (In 3rd dynamometer slot); R 35# in 4th slot, 28# in 3rd slot, 40th: 47 in 4th slot; 50th: 12/11/21: 46# on 3rd slot; 01/22/22: 30# on 3rd slot   Time 12    Period Weeks    Status On-going    Target Date 04/16/22      OT LONG TERM GOAL #3   Title Pt. will improve right lateral pinch strength by 5# to assist with cutting food    Baseline Eval: R: 17, L: 25; 20th: R: 18; 40th: 15# fingers slipping on meter, 50th: 9# fingers slipping; 01/22/22: 12# fingers slipping   Time 12    Period Weeks    Status On-going    Target Date 04/16/22      OT  LONG TERM GOAL #4   Title Pt. will improve Right 3pt pinch by 2# to be able to hold/open items for cooking    Baseline Eval: R: Pt. unable to engage thumb L: 29#; 20th: R unable; 40th:  unable, fingers slipping; 50th:  unable, fingers slipping; 01/22/22: fingers slipping but pt is now able to grasp a ball peg with 3 point pinch pattern   Time 12    Period Weeks    Status On-going    Target Date 04/16/22      OT LONG TERM GOAL #5   Title Pt. will improve right hand [facility] skills to be able to independently manipulate buttons, and zippers.    Baseline Eval: Pt. has difficulty managing buttons,a nd zippers. 10th visit:  improving; 20th: unable to engage R hand effectively, performs 1 handed with L; 50th: performs L handed only; 01/22/22: pt can use R hand as a stabilizer with clothing fasteners with mod vc   Time 12    Period Weeks    Status On-going    Target Date 04/16/22      OT LONG TERM GOAL #6   Title Pt. will independently write his name    Baseline Eval: Pt. is unable to hold a pen; 20th: pt can grip PenAgain with R hand and can make marks on paper with difficulty, but not yet writing name; 40th:  continues to demonstrate difficulty with grasp of pen; 50th 12/11/21: With assist to set up hand on pen again, pt was able to write first name and initial of last name in 1 1/4" tall letters with fair legibility (pt wants to defer this goal, states this is low on his list of priorities for now)   Time 12    Period  Weeks    Status Defer   Target Date 04/16/22      OT LONG TERM GOAL #7   Title Pt. will improve FOTO score by 2 points to reflect funational improvement    Baseline Eval: FOTO score 46 with TR score 52; 20th: 45; 40th: 83;  50th: 46; 01/22/22: 50   Time 12    Period Weeks    Status On-going    Target Date 04/16/22             Plan -    Clinical Impression Statement OT focus continues to address mixed muscle tone in RUE which is limiting pt's grasp and manipulation of objects  in the R dominant hand.  Therapeutic exercises continue to focus on strengthening antagonist muscle groups to combat tone.  Pt continues to work on wrist flexion against gravity with a closed fist and supinated forearm, requiring active assist from OT to move past neutral d/t extensor tone.  Extensor tone limits pt's ability to maintain a tight grasp when holding objects in hand when wrist is flexed or forearm is supinated, but pt experiences flexor tone in the digits when adducting fingers.  Pt continues to require initial assist to form a 3 point pinch pattern around a ball peg, but with repetition, pt can achieve this grasp and move a peg from 1 place to another, with thumb and IF still occasionally slipping from position if pt tries to grasp with more than light pressure around an object (clip or peg), which limits pt's ability to pick up anything with any weight using this grasp pattern.  Pt continues to remain diligent with his HEP and exercise progressions.  Pt will continue to work towards neuro re-ed of right hand with emphasis on thumb movements to integrate into functional grasping and manipulation skills.    OT Occupational Profile and History Detailed Assessment- Review of Records and additional review of physical, cognitive, psychosocial history related to current functional performance    Occupational performance deficits (Please refer to evaluation for details): ADL's;IADL's;Education    Rehab Potential Good    Clinical Decision Making Several treatment options, min-mod task modification necessary    Comorbidities Affecting Occupational Performance: May have comorbidities impacting occupational performance    Modification or Assistance to Complete Evaluation  Min-Moderate modification of tasks or assist with assess necessary to complete eval    OT Frequency 2x / week    OT Duration 12 weeks    OT Treatment/Interventions Self-care/ADL training;DME and/or AE instruction;Therapeutic  exercise;Ultrasound;Neuromuscular education;Therapeutic activities;Energy conservation;Moist Heat;Patient/family education;Splinting;Functional Mobility Training;Paraffin    Plan Neuro re-ed and therapeutic exercise    Consulted and Agree with Plan of Care Patient             Leta Speller, MS, OTR/L   Darleene Cleaver, OT 01/25/2022, 2:48 PM

## 2022-01-28 ENCOUNTER — Telehealth: Payer: Self-pay | Admitting: Physician Assistant

## 2022-01-28 NOTE — Telephone Encounter (Signed)
Per provider, she wants patient to make sure it is okay with his other doctor before refilling this.

## 2022-01-28 NOTE — Telephone Encounter (Signed)
[facility] faxed refill request for the following medications:  celecoxib (CELEBREX) 200 MG capsule   Please advise.

## 2022-01-28 NOTE — Telephone Encounter (Addendum)
Pt stated he no longer sees Dr.Carvalho (spine doctor) and hasn't seen him for a month and a half, maybe two months.  Pt mentioned he was on the medication Mobic before being put on CELEBREX.   Pt is requesting a call back.

## 2022-01-29 ENCOUNTER — Ambulatory Visit: Payer: Medicare Other

## 2022-01-29 ENCOUNTER — Encounter: Payer: Medicare HMO | Admitting: Speech Pathology

## 2022-01-29 DIAGNOSIS — R278 Other lack of coordination: Secondary | ICD-10-CM

## 2022-01-29 DIAGNOSIS — M6281 Muscle weakness (generalized): Secondary | ICD-10-CM

## 2022-01-29 DIAGNOSIS — Z8673 Personal history of transient ischemic attack (TIA), and cerebral infarction without residual deficits: Secondary | ICD-10-CM

## 2022-01-29 NOTE — Therapy (Signed)
OUTPATIENT OCCUPATIONAL THERAPY NEURO TREATMENT NOTE   Patient Name: David Reeves MRN: [id number] DOB:[birth year], 51 y.o., male Today's Date: 01/29/2022  PCP: [name], PA-C REFERRING PROVIDER: [name], PA-C   OT End of Session - 01/29/22 1706     Visit Number 44    Number of Visits 90    Date for OT Re-Evaluation 04/16/22    Authorization Type Progress reporting period starting 01/22/22    OT Start Time 0915    OT Stop Time 1000    OT Time Calculation (min) 45 min    Activity Tolerance Patient tolerated treatment well    Behavior During Therapy [facility] for tasks assessed/performed               Past Medical History:  Diagnosis Date   Back pain 04/22/2012   Bone spur    Bulging disc 04/22/2012   Degenerative disc disease    Osteoarthritis    Panic anxiety syndrome    Stroke ([place]) 10/2020   Taking multiple medications for chronic disease    Past Surgical History:  Procedure Laterality Date   CYSTECTOMY     head   HERNIA REPAIR Left 2006,2014   [facility]   TOE SURGERY Right 2007   Patient Active Problem List   Diagnosis Date Noted   Alterations of sensations following cerebrovascular accident 03/08/2021   Aphasia as late effect of cerebrovascular accident (CVA) 03/08/2021   Muscle spasm 02/22/2021   Acute non-recurrent frontal sinusitis 02/22/2021   Chronic pain syndrome 01/23/2021   Primary hypertension 01/23/2021   Nerve pain 01/23/2021   Erectile dysfunction due to diseases classified elsewhere 01/23/2021   Cognitive dysfunction 01/23/2021   Scrotal edema 01/23/2021   Elevated LDL cholesterol level 01/23/2021   History of stroke with residual deficit 01/23/2021   Primary insomnia 01/23/2021   Mouth pain 01/23/2021   Laceration of right hand without foreign body    Closed compression fracture of L1 vertebra ([place]) 05/24/2016   Lumbar stenosis with neurogenic claudication 11/01/2015   Chronic bilateral low back pain with right-sided  sciatica 11/08/2014   Hx of hemorrhoids 11/08/2014   AAA (abdominal aortic aneurysm) ([place]) 09/22/2014   Chronic pain associated with significant psychosocial dysfunction 09/22/2014   Panic disorder 09/22/2014   AB (asthmatic bronchitis) 08/17/2014   Anxiety 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 08/17/2014   Backache 08/17/2014   Lumbosacral spondylosis without myelopathy 08/17/2014   Disorder of male genital organs 08/17/2014   Brash 08/17/2014   Low back pain 08/17/2014   Tendon nodule 08/17/2014   Episodic paroxysmal anxiety disorder 08/17/2014   Hernia, inguinal, right 08/17/2014   Fast heart beat 08/17/2014   Illness 08/17/2014   Inguinal hernia 10/14/2012    ONSET DATE: 11/09/2020  REFERRING DIAG: CVA  THERAPY DIAG:  Muscle weakness (generalized)  Other lack of coordination  H/O ischemic left MCA stroke  Rationale for Evaluation and Treatment Rehabilitation  PERTINENT HISTORY: Pt. is a 51 y.o. who was diagnosed with a CVA on July 21st, 2022. Pt. completed several weeks of  inpatient rehab at [facility]. After returning to home, Pt. sustained a fall in december of 2022, and was admitted to the hospital with COVI-19, and back pain from the fall. Since the most recent discharge, Pt. has been residing with the ex-wife until the Pt. is ready to return to independent living. Pt. PMHx includes: Bilateral LBP with right sided sciatica, Lumbosacral spondylosis myelopathy, closed compression Fx of L1. Pt. enjoys cooking, and riding motorcycles.  PRECAUTIONS: Fall  SUBJECTIVE: Pt reports his back is excellent today.  PAIN:  Are you having pain? Yes: NPRS scale: 1/10 Pain location: low back Pain description: aching Aggravating factors: prolonged sitting or lying down Relieving factors: rest, heat, walking  [facility] OT Assessment        12/11/21 01/22/22            Observation/Other Assessments     Focus on Therapeutic Outcomes (FOTO)   46 50            Coordination     Right 9  Hole Peg Test unable  unable    Left 9 Hole Peg Test 30 sec  30 sec            Hand Function     Right Hand Grip (lbs) 35 (3rd setting) 30 lb (3rd);  41 lb (4th setting),     Right Hand Lateral Pinch 9 lbs   slipping 12 lbs (thumb slips)    Right Hand 3 Point Pinch   unable to position thumb to produce 3 pt pinch Unable to position thumb to produce 3 point pinch    Left Hand Grip (lbs) 116      Left Hand Lateral Pinch 29 lbs      Left 3 point pinch 27 lbs        OBJECTIVE:  TODAY'S TREATMENT:  Therapeutic Exercise: Completed passive stretch for R forearm sup/pron with active assisted R wrist flexion with a supinated forearm and closed fist using 1# dowel (performed bilaterally) for 3 sets 10 reps each.  Flexor tone in R wrist in this position limits flexion much past neutral.  Participated in place and hold for thumb radial abd, resisted lateral pinch pushing into foam dowel, active assisted palmar abd x2 sets 7-10 each.  Facilitated 3 point pinch, pushing into foam dowel, with OT providing set up and stabilization of digits in 3 point pinch pattern while pt pinched into dowel for 2 sets 10 reps each.  Neuro re-ed: Facilitated 3 point pinch prehension patterns working to grasp circular blocks.  Pt was cued to grasp block with a light grip to reduce thumb slipping from the 3 point pinch pattern and demonstrated good control with his release of blocks into container.  Progressed to pinching blocks with slight increase in resistive pinch; pt struggled to perform without thumb slipping.  OT issued foam dowel for pt to practice lateral pinch and 3 point pinch patterns at home as the foam provides necessary proprioceptive feedback and pt can hold it with less slipping of his fingers.   PATIENT EDUCATION: Education details: HEP progression, grasp/release activities; wrist flex against gravity, lateral and 3 point pinch with foam dowel Person educated: Patient Education method:  Explanation Education comprehension: verbalized and demonstrated understanding.      OT Short Term Goals -      OT SHORT TERM GOAL #1   Title Pt. wil demonstrtae independence with HEPs    Baseline Eval: Pt. currently does not have one, 10th visit:  changes in HEP ongoing; 20th: instructed in putty exercises, changes ongoing; 40th: continual updates to HEP as pt progresses; 50th: continual updates; 01/22/22: continual updates   Time 6    Period Weeks    Status On-going    Target Date 03/05/22             OT Long Term Goals -       OT LONG TERM GOAL #1   Title Pt. will improve  RUE strength by 2 mm grades to assist with ADLs, and IADLs,    Baseline Eval: Right shoulder flexion, abduction 3/5, elbow flexion, extension wrist extension 3+/5, 10th visit: improving with RUE strength by not yet met goal; 20th visit: R shd flex/abd 4-, elbow flex/ext 4+, wrist flex 4-, wrist ext 4+; 40:  R shd flex/abd 4,  elbow flex/ext 4+, wrist flex 4, wrist ext 4+; 50th visit 8/22: R shoulder flex/abd 4/5, elbow flex/ext 5/5, wrist flex 4, wrist ext 4+; 01/22/22: shoulder flex/abd 4/5, elbow flex/ext 5/5, wrist flex 4, wrist ext 4+,    Time 12    Period Weeks    Status On-going    Target Date 04/16/22      OT LONG TERM GOAL #2   Title Pt. will improve right grip by 10 lbs to prepare for firmly holding objects for IADLs.    Baseline Eval: R: 38#, L: 124# (In 3rd dynamometer slot); R 35# in 4th slot, 28# in 3rd slot, 40th: 47 in 4th slot; 50th: 12/11/21: 46# on 3rd slot; 01/22/22: 30# on 3rd slot   Time 12    Period Weeks    Status On-going    Target Date 04/16/22      OT LONG TERM GOAL #3   Title Pt. will improve right lateral pinch strength by 5# to assist with cutting food    Baseline Eval: R: 17, L: 25; 20th: R: 18; 40th: 15# fingers slipping on meter, 50th: 9# fingers slipping; 01/22/22: 12# fingers slipping   Time 12    Period Weeks    Status On-going    Target Date 04/16/22      OT LONG TERM  GOAL #4   Title Pt. will improve Right 3pt pinch by 2# to be able to hold/open items for cooking    Baseline Eval: R: Pt. unable to engage thumb L: 29#; 20th: R unable; 40th:  unable, fingers slipping; 50th:  unable, fingers slipping; 01/22/22: fingers slipping but pt is now able to grasp a ball peg with 3 point pinch pattern   Time 12    Period Weeks    Status On-going    Target Date 04/16/22      OT LONG TERM GOAL #5   Title Pt. will improve right hand [facility] skills to be able to independently manipulate buttons, and zippers.    Baseline Eval: Pt. has difficulty managing buttons,a nd zippers. 10th visit:  improving; 20th: unable to engage R hand effectively, performs 1 handed with L; 50th: performs L handed only; 01/22/22: pt can use R hand as a stabilizer with clothing fasteners with mod vc   Time 12    Period Weeks    Status On-going    Target Date 04/16/22      OT LONG TERM GOAL #6   Title Pt. will independently write his name    Baseline Eval: Pt. is unable to hold a pen; 20th: pt can grip PenAgain with R hand and can make marks on paper with difficulty, but not yet writing name; 40th:  continues to demonstrate difficulty with grasp of pen; 50th 12/11/21: With assist to set up hand on pen again, pt was able to write first name and initial of last name in 1 1/4" tall letters with fair legibility (pt wants to defer this goal, states this is low on his list of priorities for now)   Time 12    Period Weeks    Status Defer   Target Date 04/16/22  OT LONG TERM GOAL #7   Title Pt. will improve FOTO score by 2 points to reflect funational improvement    Baseline Eval: FOTO score 46 with TR score 52; 20th: 45; 40th: 20;  50th: 46; 01/22/22: 50   Time 12    Period Weeks    Status On-going    Target Date 04/16/22             Plan -    Clinical Impression Statement Pt seen for OT session with focus on lateral and 3 point pinching patterns.  Pt continues to require OT assist to set  fingers into a 3 point pinch pattern, and practiced repetitions today sustaining a 3 point pinch around a circular block.  Progressed to using a slight increase in resistive pinch, but pt struggles to perform without thumb slipping.  Pt continues to remain diligent with his HEP and exercise progressions.  OT issued foam dowel for pt to practice lateral pinch and 3 point pinch patterns at home as the foam provides necessary proprioceptive feedback and pt can hold it with less slipping of his fingers while practicing these pinching patterns.  Pt will continue to work towards neuro re-ed of right hand with emphasis on thumb movements to integrate into functional grasping and manipulation skills.    OT Occupational Profile and History Detailed Assessment- Review of Records and additional review of physical, cognitive, psychosocial history related to current functional performance    Occupational performance deficits (Please refer to evaluation for details): ADL's;IADL's;Education    Rehab Potential Good    Clinical Decision Making Several treatment options, min-mod task modification necessary    Comorbidities Affecting Occupational Performance: May have comorbidities impacting occupational performance    Modification or Assistance to Complete Evaluation  Min-Moderate modification of tasks or assist with assess necessary to complete eval    OT Frequency 2x / week    OT Duration 12 weeks    OT Treatment/Interventions Self-care/ADL training;DME and/or AE instruction;Therapeutic exercise;Ultrasound;Neuromuscular education;Therapeutic activities;Energy conservation;Moist Heat;Patient/family education;Splinting;Functional Mobility Training;Paraffin    Plan Neuro re-ed and therapeutic exercise    Consulted and Agree with Plan of Care Patient             Leta Speller, MS, OTR/L   Darleene Cleaver, OT 01/29/2022, 5:11 PM

## 2022-01-30 ENCOUNTER — Encounter: Payer: Self-pay | Admitting: Physician Assistant

## 2022-01-30 NOTE — Telephone Encounter (Signed)
Pt checking status of medication request  Pt requesting mobic 15mg  sent to local pharmacy  Please reference pts 10-11 mychart message  Pt requesting a cb  [facility] [id number]on, [place] Phone:  [phone]  Fax:  [phone]

## 2022-01-31 ENCOUNTER — Other Ambulatory Visit: Payer: Self-pay | Admitting: Physician Assistant

## 2022-01-31 DIAGNOSIS — G8929 Other chronic pain: Secondary | ICD-10-CM

## 2022-01-31 MED ORDER — DICLOFENAC SODIUM 50 MG PO TBEC: 50.0000 mg | DELAYED_RELEASE_TABLET | Freq: Two times a day (BID) | ORAL | 0 refills | Status: DC

## 2022-01-31 NOTE — Telephone Encounter (Signed)
Patient has called to follow up on his medication request. Patient advised his provider has been sent message/request and  awaiting her response. Patient is upset this request has taken so long to process- advised provider is in office today and I will send message to let her know he has called to follow up.

## 2022-01-31 NOTE — Therapy (Signed)
[place] MAIN [facility] SERVICES [address] [place], [place], [address] Phone: [phone]   Fax:  [phone]  Patient Details  Name: David Reeves MRN: [id number] Date of Birth: 1970-08-28 Referring Provider:  [name], PA-C  Encounter Date: 02/01/2022   OUTPATIENT PHYSICAL THERAPY TREATMENT NOTE/RECERT       Patient Name: David Reeves MRN: [id number] DOB:11/19/1970, 51 y.o., male Today's Date: 02/01/2022  PCP: [name], PA-C REFERRING PROVIDER: [name], PA-C   PT End of Session - 02/01/22 1023     Visit Number 30    Number of Visits 11    Date for PT Re-Evaluation 04/26/22    Authorization Type [employer]    Progress Note Due on Visit 32    PT Start Time 1017    PT Stop Time 1100    PT Time Calculation (min) 43 min    Equipment Utilized During Treatment Gait belt    Activity Tolerance Patient tolerated treatment well;No increased pain    Behavior During Therapy [facility] for tasks assessed/performed                            Past Medical History:  Diagnosis Date   Back pain 04/22/2012   Bone spur    Bulging disc 04/22/2012   Degenerative disc disease    Osteoarthritis    Panic anxiety syndrome    Stroke ([place]) 10/2020   Taking multiple medications for chronic disease    Past Surgical History:  Procedure Laterality Date   CYSTECTOMY     head   HERNIA REPAIR Left 2006,2014   [facility]   TOE SURGERY Right 2007   Patient Active Problem List   Diagnosis Date Noted   Alterations of sensations following cerebrovascular accident 03/08/2021   Aphasia as late effect of cerebrovascular accident (CVA) 03/08/2021   Muscle spasm 02/22/2021   Acute non-recurrent frontal sinusitis 02/22/2021   Chronic pain syndrome 01/23/2021   Primary hypertension 01/23/2021   Nerve pain 01/23/2021   Erectile dysfunction due to diseases classified elsewhere 01/23/2021   Cognitive  dysfunction 01/23/2021   Scrotal edema 01/23/2021   Elevated LDL cholesterol level 01/23/2021   History of stroke with residual deficit 01/23/2021   Primary insomnia 01/23/2021   Mouth pain 01/23/2021   Laceration of right hand without foreign body    Closed compression fracture of L1 vertebra ([place]) 05/24/2016   Lumbar stenosis with neurogenic claudication 11/01/2015   Chronic bilateral low back pain with right-sided sciatica 11/08/2014   Hx of hemorrhoids 11/08/2014   AAA (abdominal aortic aneurysm) ([place]) 09/22/2014   Chronic pain associated with significant psychosocial dysfunction 09/22/2014   Panic disorder 09/22/2014   AB (asthmatic bronchitis) 08/17/2014   Anxiety 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 08/17/2014   Backache 08/17/2014   Lumbosacral spondylosis without myelopathy 08/17/2014   Disorder of male genital organs 08/17/2014   Brash 08/17/2014   Low back pain 08/17/2014   Tendon nodule 08/17/2014   Episodic paroxysmal anxiety disorder 08/17/2014   Hernia, inguinal, right 08/17/2014   Fast heart beat 08/17/2014   Illness 08/17/2014   Inguinal hernia 10/14/2012    REFERRING DIAG: History of stroke with residual deficit, chronic bilateral low back pain with right-sided sciatica, Lumbosacral spondylosis without myelopathy THERAPY DIAG:  Chronic bilateral low back pain without sciatica  Unsteadiness on feet  Muscle weakness (generalized)  Rationale for Evaluation and Treatment Rehabilitation  PERTINENT HISTORY: Pt. is a  51 y.o. who was diagnosed with a CVA on July 21st, 2022. Pt. completed several weeks of inpatient rehab at [facility]. After returning to home. He was discharged in late August/early September and recieved home health PT. Pt. sustained a fall in december of 2022, and was admitted to the hospital with COVID-19, and Chronic back pain. He reports chronic back pain syndrome since 2012. Since the most recent discharge, Pt. has been residing with the ex-wife  until the Pt. is ready to return to independent living. He did recieve 2 weeks of home health after discharge in December 2022. He is now being referred to outpatient PT to address weakness from stroke and improve fine motor movement. He reports rarely getting numbness/tingling in RLE. PMHx includes: Bilateral LBP with right sided sciatica, Lumbosacral spondylosis myelopathy, closed compression Fx of L1. Has chronic insomnia. osteoarthritis, Panic anxiety syndrome (taking medication) Pt. enjoys cooking, and riding motorcycles. Patient is going to [facility] spine center for Epidural spinal injections on 06/07/21;  PRECAUTIONS: Fall, Spinal Brace wears daily, approximately 25%, when exercising; semi-rigid lumbar brace  SUBJECTIVE: Pt does not have his pain patch currently, they have not arrived yet at the pharmacy but pt will be able to get them at the end of the day. He rates back pain as a 3/10. Pt reports no stumbles/falls currently.  PAIN:  Are you having pain? Yes: NPRS scale: 3/10 Pain location: bilat low back Pain description: dull ache; always constant,  Aggravating factors: worse with sitting,  Relieving factors: heat helps temporarily, pain pills/pain patch   TODAY'S TREATMENT 02/01/22   TherEx- Heat donned to low back while pt performs the following interventions. Skin WNL prior to application and upon removal of heat. Pt reports heat feels good to low back.  Mat table- LTR 10x each side   Deadbug progression LE in 90/09 position, PPT, TrA activation 10x with 10 sec holds   SLR 20x each LE no AW   Hamstring curl with bridge on stability ball 20x. Rates medium  Sidelye hip abduction with 2.5# weights 15x each LE .     Pt reports decrease in pain level to 2/10 Leg press: 70# 10x  115# 10x.  130# 8x  145# 5x. Easy-medium TherAct-  Stairs ascend/descend 8 steps - ascend w/o handrail, recip pattern, descend intermittent UE support on handrail due to heel getting caught on back of  step, recip pattern. Close CGA throughout.   No pain with interventions. PATIENT EDUCATION:  Education details: Pt educated throughout session about proper posture and technique with exercises. Improved exercise technique, movement at target joints, use of target muscles after min to mod verbal, visual, tactile cues. Goals, plan.  Person educated: Patient Education method: Explanation, Demonstration, Tactile cues, and Verbal cues Education comprehension: verbalized understanding, returned demonstration, verbal cues required, tactile cues required, and needs further education   HOME EXERCISE PROGRAM:   No updates on this date pt to continue HEP as previously given     PT Short Term Goals -       PT SHORT TERM GOAL #1   Title Patient will be adherent to HEP at least 3x a week to improve functional strength and balance for better safety at home.    Baseline 4/4: doing them 1-2x a week; 08/20/2021=Patient reports performing his low back stretching and no questions currently HEP. 10/24/21: doing HEP 4x a week   Time 4    Period Weeks    Status Achieved    Target Date 08/21/21  PT SHORT TERM GOAL #2   Title Patient (< 49 years old) will complete five times sit to stand test in < 15 seconds indicating an increased LE strength and improved balance.    Baseline 4/4: 17.85 sec with arms across chest; 08/20/2021= 17.6 sec , 7/5: 12.85 sec   Time 4    Period Weeks    Status Achieved;    Target Date 10/01/21              PT Long Term Goals -  TARGET DATE FOR GOALS 04/26/2022      PT LONG TERM GOAL #1   Title Patient will increase six minute walk test distance to >1000 for progression to community ambulator and improve gait ability    Baseline 4/4: 1115 feet , 7/5: 1080 feet;    Time 4    Period Weeks    Status Achieved    Target Date 08/21/21      PT LONG TERM GOAL #2   Title Patient will ascend/descend 8 stairs without rail assist independently without loss of balance to improve  ability to get in/out of home.    Baseline 4/4: requires 1 rail assist; 08/20/2021- Patient demonstrated steps without railing using reciprocal steps today. 7/5: ascend/descend 6 steps reciprocally without rail, but required CGA for safety when descending; 7/25: Able to complete without UE support and good balance going up steps, unsafe descending steps without UE support. Required min assist and UE support to prevent LOB descending.; 8/15: requires UUE support to descend and close CGA due to; unsteadiness; 9/26: ascend without UE support, recip pattern; descend with UUE support, recip stepping, but still doesn't feel steady yet with descending; 10/13 able to demo recip pattern ascending hands free, required instance of UE support with descending, recip pattern due to heel getting caught under step   Time 12*corrected   Period Weeks    Status Partially met   Target Date 02/05/2022      PT LONG TERM GOAL #3   Title Patient will increase BLE gross strength to 4+/5 as to improve functional strength for independent gait, increased standing tolerance and increased ADL ability.    Baseline 4/4: not formally assessed; 08/20/2021= 4+/5 with Right hip flex/knee ext/flex; Right hip abd= 4/5, 7/5: 4+/5 hip    Time 12    Period Weeks    Status Achieved;    Target Date 11/12/21      PT LONG TERM GOAL #4   Title Patient will improve FOTO score to >50% to indicate improved functional mobility with less pain with ADLs.    Baseline 4/4: 35%; 08/20/2021= 42% 7/5: 47%; 7:25:recently addressed; 11/30/21: 35; 9/26: 54%    Time 12    Period Weeks    Status MET   Target Date      PT LONG TERM GOAL #5   Title Patient will report a worst pain of 4/10 in low back over last week to indicate improved tolerance with ADLs.    Baseline 4/4: 7/10; 08/20/2021= 4/10 , 7/5: worst 7/10 in low back; 7/25: 6/10 ; 8/15: pt reports 6/10; 9/26: 4/10   Time 12    Period Weeks    Status Achieved    Target Date 02/05/2022      Additional  Long Term Goals   Additional Long Term Goals Yes      PT LONG TERM GOAL #6   Title Pt will increase 10MWT by at least 0.13 m/s in order to demonstrate clinically significant improvement  in community ambulation    Baseline 08/20/2021=0.94 m/s 7/5: 1.5 m/s   Time 12    Period Weeks    Status Achieved;    Target Date 11/12/21      PT LONG TERM GOAL #7   Title Patient will demonstrate improved static standing balance as seen by ability to single leg stance on right LE > 12 sec consistently for optimal balance on level and unlevel surfaces.    Baseline 08/20/2021= 4 sec SLS on right , 7/5: 4-5 sec inconsistent; 7/25 able to achieve 15 sec after multiple attempts of 4-5 sec holds; 8/15: LLE 15 sec, RLE 8 sec; 9/26: LLE 30 sec, RLE 17 seconds   Time 12    Period Weeks    Status MET   Target Date 02/05/2022    PT LONG TERM GOAL #8  Title Pt will report ability to ambulate for at least 15 minutes without an increase in his LBP in order to improve community participation and ease with ADLs  Baseline 9/26: increase in pain at 15 min mark; 10/13: reports about 20 minutes.   Time 3   Period Weeks   Status MET  Target Date 02/05/2022        PT LONG TERM GOAL #9  Title Pt will report ability to ambulate for at least 45 minutes without an increase in his LBP in order to improve community participation and ease with ADLs  Baseline 9/26: increase in pain at 15 min mark; 10/13: reports about 20 minutes.   Time 3   Period Weeks   Status NEW  Target Date        Plan -     Clinical Impression Statement Pt making gains AEB achieving one therapy goal and partially meeting another. This indicates improvements in activity tolerance/ambulation with LBP, and improved ability to safely ascend/descend stairs. While pt is making gains, he has yet to meet all therapy goals and decrease pain levels enough to not limit ADLs. The pt will benefit from further skilled PT to improve strength and balance, and decrease  pain levels.   Personal Factors and Comorbidities Comorbidity 3+;Past/Current Experience;Time since onset of injury/illness/exacerbation    Comorbidities PMHx includes: Bilateral LBP with right sided sciatica, Lumbosacral spondylosis myelopathy, closed compression Fx of L1. Has chronic insomnia. osteoarthritis, Panic anxiety syndrome (taking medication)    Examination-Activity Limitations Bend;Carry;Locomotion Level;Sit;Squat;Stairs;Stand;Transfers    Examination-Participation Restrictions Cleaning;Community Activity;Driving;Laundry;Meal Prep;Occupation;Shop;Volunteer;Yard Work    Stability/Clinical Decision Making Stable/Uncomplicated    Rehab Potential Good    PT Frequency 2x / week    PT Duration 12 weeks    PT Treatment/Interventions Cryotherapy;Electrical Stimulation;Moist Heat;Traction;Gait training;Stair training;Functional mobility training;Therapeutic activities;Therapeutic exercise;Balance training;Neuromuscular re-education;Patient/family education;Manual techniques;Passive range of motion;Dry needling;Energy conservation    PT Next Visit Plan  Progress core stabilization, Manual therapy for low back and LE ROM, instruct in balance HEP, continue plan       Consulted and Agree with Plan of Care Patient              11:18 AM, 02/01/22   Zollie Pee, PT 02/01/2022, 11:18 AM  [place] MAIN [facility] SERVICES [place], [place], [address] Phone: [phone]   Fax:  [phone]

## 2022-02-01 ENCOUNTER — Ambulatory Visit: Payer: Medicare Other

## 2022-02-01 ENCOUNTER — Encounter: Payer: Medicare HMO | Admitting: Speech Pathology

## 2022-02-01 ENCOUNTER — Ambulatory Visit: Payer: Medicare Other | Admitting: Occupational Therapy

## 2022-02-01 DIAGNOSIS — R278 Other lack of coordination: Secondary | ICD-10-CM

## 2022-02-01 DIAGNOSIS — M6281 Muscle weakness (generalized): Secondary | ICD-10-CM | POA: Diagnosis not present

## 2022-02-01 DIAGNOSIS — R2681 Unsteadiness on feet: Secondary | ICD-10-CM

## 2022-02-01 DIAGNOSIS — G8929 Other chronic pain: Secondary | ICD-10-CM

## 2022-02-05 ENCOUNTER — Encounter: Payer: Medicare HMO | Admitting: Speech Pathology

## 2022-02-05 ENCOUNTER — Ambulatory Visit: Payer: Medicare Other

## 2022-02-05 DIAGNOSIS — M6281 Muscle weakness (generalized): Secondary | ICD-10-CM

## 2022-02-05 DIAGNOSIS — Z8673 Personal history of transient ischemic attack (TIA), and cerebral infarction without residual deficits: Secondary | ICD-10-CM

## 2022-02-05 DIAGNOSIS — R278 Other lack of coordination: Secondary | ICD-10-CM

## 2022-02-06 ENCOUNTER — Encounter: Payer: Self-pay | Admitting: Occupational Therapy

## 2022-02-06 NOTE — Therapy (Signed)
OUTPATIENT OCCUPATIONAL THERAPY NEURO TREATMENT NOTE   Patient Name: David Reeves MRN: [id number] DOB:12/06/1970, 51 y.o., male Today's Date: 02/06/2022  PCP: [name], PA-C REFERRING PROVIDER: [name], PA-C   OT End of Session - 02/06/22 1325     Visit Number 19    Number of Visits 3    Date for OT Re-Evaluation 04/16/22    Authorization Type Progress reporting period starting 01/22/22    OT Start Time 0917    OT Stop Time 1002    OT Time Calculation (min) 45 min    Activity Tolerance Patient tolerated treatment well    Behavior During Therapy [facility] for tasks assessed/performed               Past Medical History:  Diagnosis Date   Back pain 04/22/2012   Bone spur    Bulging disc 04/22/2012   Degenerative disc disease    Osteoarthritis    Panic anxiety syndrome    Stroke ([place]) 10/2020   Taking multiple medications for chronic disease    Past Surgical History:  Procedure Laterality Date   CYSTECTOMY     head   HERNIA REPAIR Left 2006,2014   [facility]   TOE SURGERY Right 2007   Patient Active Problem List   Diagnosis Date Noted   Alterations of sensations following cerebrovascular accident 03/08/2021   Aphasia as late effect of cerebrovascular accident (CVA) 03/08/2021   Muscle spasm 02/22/2021   Acute non-recurrent frontal sinusitis 02/22/2021   Chronic pain syndrome 01/23/2021   Primary hypertension 01/23/2021   Nerve pain 01/23/2021   Erectile dysfunction due to diseases classified elsewhere 01/23/2021   Cognitive dysfunction 01/23/2021   Scrotal edema 01/23/2021   Elevated LDL cholesterol level 01/23/2021   History of stroke with residual deficit 01/23/2021   Primary insomnia 01/23/2021   Mouth pain 01/23/2021   Laceration of right hand without foreign body    Closed compression fracture of L1 vertebra ([place]) 05/24/2016   Lumbar stenosis with neurogenic claudication 11/01/2015   Chronic bilateral low back pain with right-sided  sciatica 11/08/2014   Hx of hemorrhoids 11/08/2014   AAA (abdominal aortic aneurysm) ([place]) 09/22/2014   Chronic pain associated with significant psychosocial dysfunction 09/22/2014   Panic disorder 09/22/2014   AB (asthmatic bronchitis) 08/17/2014   Anxiety 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 08/17/2014   Backache 08/17/2014   Lumbosacral spondylosis without myelopathy 08/17/2014   Disorder of male genital organs 08/17/2014   Brash 08/17/2014   Low back pain 08/17/2014   Tendon nodule 08/17/2014   Episodic paroxysmal anxiety disorder 08/17/2014   Hernia, inguinal, right 08/17/2014   Fast heart beat 08/17/2014   Illness 08/17/2014   Inguinal hernia 10/14/2012    ONSET DATE: 11/09/2020  REFERRING DIAG: CVA  THERAPY DIAG:  Muscle weakness (generalized)  Other lack of coordination  H/O ischemic left MCA stroke  Rationale for Evaluation and Treatment Rehabilitation  PERTINENT HISTORY: Pt. is a 51 y.o. who was diagnosed with a CVA on July 21st, 2022. Pt. completed several weeks of  inpatient rehab at [facility]. After returning to home, Pt. sustained a fall in december of 2022, and was admitted to the hospital with COVI-19, and back pain from the fall. Since the most recent discharge, Pt. has been residing with the ex-wife until the Pt. is ready to return to independent living. Pt. PMHx includes: Bilateral LBP with right sided sciatica, Lumbosacral spondylosis myelopathy, closed compression Fx of L1. Pt. enjoys cooking, and riding motorcycles.  PRECAUTIONS: Fall  SUBJECTIVE: Pt verbalizes frustration with working on his hand so much at home but it's difficult to see such slow progress.  OT provided encouragement to pt and assisted to identify gains made over time from pt's hard work.    PAIN:  Are you having pain? Yes: NPRS scale: 1/10 Pain location: low back Pain description: aching Aggravating factors: prolonged sitting or lying down Relieving factors: rest, heat, walking   [facility] OT Assessment        12/11/21 01/22/22            Observation/Other Assessments     Focus on Therapeutic Outcomes (FOTO)   46 50            Coordination     Right 9 Hole Peg Test unable  unable    Left 9 Hole Peg Test 30 sec  30 sec            Hand Function     Right Hand Grip (lbs) 35 (3rd setting) 30 lb (3rd);  41 lb (4th setting),     Right Hand Lateral Pinch 9 lbs   slipping 12 lbs (thumb slips)    Right Hand 3 Point Pinch   unable to position thumb to produce 3 pt pinch Unable to position thumb to produce 3 point pinch    Left Hand Grip (lbs) 116      Left Hand Lateral Pinch 29 lbs      Left 3 point pinch 27 lbs        OBJECTIVE:  TODAY'S TREATMENT:  Therapeutic Exercise: Completed passive stretch for R forearm sup/pron.  Participated in place and hold for thumb radial abd, resisted lateral pinch pushing into foam dowel, active assisted palmar abd x2 sets 7-10 each.  Performed active digit abd and active assisted add, working to strengthen intrinsics x3 sets 7-10 each.  Facilitated 3 point pinch, pushing into foam dowel, with OT providing set up and stabilization of digits in 3 point pinch pattern while pt pinched into dowel for 2 sets 10 reps each.  Neuro re-ed: Facilitated 3 point pinch prehension patterns working to grasp vertical jumbo peg from pegboard.  Pt required multiple trials for each grasp attempt with cues for modulation of pinch pressure to prevent thumb from slipping.    PATIENT EDUCATION: Education details: HEP progression, grasp/release activities; wrist flex against gravity, lateral and 3 point pinch with foam dowel Person educated: Patient Education method: Explanation Education comprehension: verbalized and demonstrated understanding.      OT Short Term Goals -      OT SHORT TERM GOAL #1   Title Pt. wil demonstrtae independence with HEPs    Baseline Eval: Pt. currently does not have one, 10th visit:  changes in HEP ongoing; 20th: instructed in  putty exercises, changes ongoing; 40th: continual updates to HEP as pt progresses; 50th: continual updates; 01/22/22: continual updates   Time 6    Period Weeks    Status On-going    Target Date 03/05/22             OT Long Term Goals -       OT LONG TERM GOAL #1   Title Pt. will improve RUE strength by 2 mm grades to assist with ADLs, and IADLs,    Baseline Eval: Right shoulder flexion, abduction 3/5, elbow flexion, extension wrist extension 3+/5, 10th visit: improving with RUE strength by not yet met goal; 20th visit: R shd flex/abd 4-, elbow flex/ext 4+, wrist  flex 4-, wrist ext 4+; 40:  R shd flex/abd 4,  elbow flex/ext 4+, wrist flex 4, wrist ext 4+; 50th visit 8/22: R shoulder flex/abd 4/5, elbow flex/ext 5/5, wrist flex 4, wrist ext 4+; 01/22/22: shoulder flex/abd 4/5, elbow flex/ext 5/5, wrist flex 4, wrist ext 4+,    Time 12    Period Weeks    Status On-going    Target Date 04/16/22      OT LONG TERM GOAL #2   Title Pt. will improve right grip by 10 lbs to prepare for firmly holding objects for IADLs.    Baseline Eval: R: 38#, L: 124# (In 3rd dynamometer slot); R 35# in 4th slot, 28# in 3rd slot, 40th: 47 in 4th slot; 50th: 12/11/21: 46# on 3rd slot; 01/22/22: 30# on 3rd slot   Time 12    Period Weeks    Status On-going    Target Date 04/16/22      OT LONG TERM GOAL #3   Title Pt. will improve right lateral pinch strength by 5# to assist with cutting food    Baseline Eval: R: 17, L: 25; 20th: R: 18; 40th: 15# fingers slipping on meter, 50th: 9# fingers slipping; 01/22/22: 12# fingers slipping   Time 12    Period Weeks    Status On-going    Target Date 04/16/22      OT LONG TERM GOAL #4   Title Pt. will improve Right 3pt pinch by 2# to be able to hold/open items for cooking    Baseline Eval: R: Pt. unable to engage thumb L: 29#; 20th: R unable; 40th:  unable, fingers slipping; 50th:  unable, fingers slipping; 01/22/22: fingers slipping but pt is now able to grasp a ball peg  with 3 point pinch pattern   Time 12    Period Weeks    Status On-going    Target Date 04/16/22      OT LONG TERM GOAL #5   Title Pt. will improve right hand [facility] skills to be able to independently manipulate buttons, and zippers.    Baseline Eval: Pt. has difficulty managing buttons,a nd zippers. 10th visit:  improving; 20th: unable to engage R hand effectively, performs 1 handed with L; 50th: performs L handed only; 01/22/22: pt can use R hand as a stabilizer with clothing fasteners with mod vc   Time 12    Period Weeks    Status On-going    Target Date 04/16/22      OT LONG TERM GOAL #6   Title Pt. will independently write his name    Baseline Eval: Pt. is unable to hold a pen; 20th: pt can grip PenAgain with R hand and can make marks on paper with difficulty, but not yet writing name; 40th:  continues to demonstrate difficulty with grasp of pen; 50th 12/11/21: With assist to set up hand on pen again, pt was able to write first name and initial of last name in 1 1/4" tall letters with fair legibility (pt wants to defer this goal, states this is low on his list of priorities for now)   Time 12    Period Weeks    Status Defer   Target Date 04/16/22      OT LONG TERM GOAL #7   Title Pt. will improve FOTO score by 2 points to reflect funational improvement    Baseline Eval: FOTO score 46 with TR score 52; 20th: 45; 40th: 44;  50th: 46; 01/22/22: 50   Time  12    Period Weeks    Status On-going    Target Date 04/16/22             Plan -    Clinical Impression Statement Continued focus on active and active assisted thumb ROM in all planes, progressing to 3 point pinch patterns to grasp/release a jumbo peg from pegboard.  Pt required multiple trials for each grasp attempt with cues for modulation of pinch pressure to prevent thumb from slipping.  Pt continues to remain diligent with his HEP and exercise progressions.  Pt will continue to work towards neuro re-ed of right hand with  emphasis on thumb movements to integrate into functional grasping and manipulation skills.    OT Occupational Profile and History Detailed Assessment- Review of Records and additional review of physical, cognitive, psychosocial history related to current functional performance    Occupational performance deficits (Please refer to evaluation for details): ADL's;IADL's;Education    Rehab Potential Good    Clinical Decision Making Several treatment options, min-mod task modification necessary    Comorbidities Affecting Occupational Performance: May have comorbidities impacting occupational performance    Modification or Assistance to Complete Evaluation  Min-Moderate modification of tasks or assist with assess necessary to complete eval    OT Frequency 2x / week    OT Duration 12 weeks    OT Treatment/Interventions Self-care/ADL training;DME and/or AE instruction;Therapeutic exercise;Ultrasound;Neuromuscular education;Therapeutic activities;Energy conservation;Moist Heat;Patient/family education;Splinting;Functional Mobility Training;Paraffin    Plan Neuro re-ed and therapeutic exercise    Consulted and Agree with Plan of Care Patient             Leta Speller, MS, OTR/L   Darleene Cleaver, OT 02/06/2022, 1:26 PM

## 2022-02-06 NOTE — Therapy (Signed)
OUTPATIENT OCCUPATIONAL THERAPY NEURO TREATMENT NOTE   Patient Name: David Reeves MRN: [id number] DOB:09-16-1970, 51 y.o., male Today's Date: 02/01/2022  PCP: [name], PA-C REFERRING PROVIDER: [name], PA-C   OT End of Session     Visit Number 63    Number of Visits 68    Date for OT Re-Evaluation 04/16/22    Authorization Type Progress reporting period starting 01/22/22    OT Start Time 0931    OT Stop Time 1015    OT Time Calculation (min) 44 min    Activity Tolerance Patient tolerated treatment well    Behavior During Therapy [facility] for tasks assessed/performed               Past Medical History:  Diagnosis Date   Back pain 04/22/2012   Bone spur    Bulging disc 04/22/2012   Degenerative disc disease    Osteoarthritis    Panic anxiety syndrome    Stroke ([place]) 10/2020   Taking multiple medications for chronic disease    Past Surgical History:  Procedure Laterality Date   CYSTECTOMY     head   HERNIA REPAIR Left 2006,2014   [facility]   TOE SURGERY Right 2007   Patient Active Problem List   Diagnosis Date Noted   Alterations of sensations following cerebrovascular accident 03/08/2021   Aphasia as late effect of cerebrovascular accident (CVA) 03/08/2021   Muscle spasm 02/22/2021   Acute non-recurrent frontal sinusitis 02/22/2021   Chronic pain syndrome 01/23/2021   Primary hypertension 01/23/2021   Nerve pain 01/23/2021   Erectile dysfunction due to diseases classified elsewhere 01/23/2021   Cognitive dysfunction 01/23/2021   Scrotal edema 01/23/2021   Elevated LDL cholesterol level 01/23/2021   History of stroke with residual deficit 01/23/2021   Primary insomnia 01/23/2021   Mouth pain 01/23/2021   Laceration of right hand without foreign body    Closed compression fracture of L1 vertebra ([place]) 05/24/2016   Lumbar stenosis with neurogenic claudication 11/01/2015   Chronic bilateral low back pain with right-sided sciatica 11/08/2014    Hx of hemorrhoids 11/08/2014   AAA (abdominal aortic aneurysm) ([place]) 09/22/2014   Chronic pain associated with significant psychosocial dysfunction 09/22/2014   Panic disorder 09/22/2014   AB (asthmatic bronchitis) 08/17/2014   Anxiety 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 08/17/2014   Backache 08/17/2014   Lumbosacral spondylosis without myelopathy 08/17/2014   Disorder of male genital organs 08/17/2014   Brash 08/17/2014   Low back pain 08/17/2014   Tendon nodule 08/17/2014   Episodic paroxysmal anxiety disorder 08/17/2014   Hernia, inguinal, right 08/17/2014   Fast heart beat 08/17/2014   Illness 08/17/2014   Inguinal hernia 10/14/2012    ONSET DATE: 11/09/2020  REFERRING DIAG: CVA  THERAPY DIAG:  Muscle weakness (generalized)  Other lack of coordination  Rationale for Evaluation and Treatment Rehabilitation  PERTINENT HISTORY: Pt. is a 51 y.o. who was diagnosed with a CVA on July 21st, 2022. Pt. completed several weeks of  inpatient rehab at [facility]. After returning to home, Pt. sustained a fall in december of 2022, and was admitted to the hospital with COVI-19, and back pain from the fall. Since the most recent discharge, Pt. has been residing with the ex-wife until the Pt. is ready to return to independent living. Pt. PMHx includes: Bilateral LBP with right sided sciatica, Lumbosacral spondylosis myelopathy, closed compression Fx of L1. Pt. enjoys cooking, and riding motorcycles.   PRECAUTIONS: Fall  SUBJECTIVE:   PAIN:  Are  you having pain? Yes: NPRS scale: 1/10 Pain location: low back Pain description: aching Aggravating factors: prolonged sitting or lying down Relieving factors: rest, heat, walking  [facility] OT Assessment        12/11/21 01/22/22            Observation/Other Assessments     Focus on Therapeutic Outcomes (FOTO)   46 50            Coordination     Right 9 Hole Peg Test unable  unable    Left 9 Hole Peg Test 30 sec  30 sec            Hand Function      Right Hand Grip (lbs) 35 (3rd setting) 30 lb (3rd);  41 lb (4th setting),     Right Hand Lateral Pinch 9 lbs   slipping 12 lbs (thumb slips)    Right Hand 3 Point Pinch   unable to position thumb to produce 3 pt pinch Unable to position thumb to produce 3 point pinch    Left Hand Grip (lbs) 116      Left Hand Lateral Pinch 29 lbs      Left 3 point pinch 27 lbs        OBJECTIVE:  TODAY'S TREATMENT:  Neuro re-ed: Patient seen this date focused on facilitation functional hand position when reaching and manipulating objects with emphasis on thumb rotation and associated movements.  Multiple medium utilized this day to attempt to place in functional position.  Therapist placed wedge of Theraputty onto [place] desk to attempt blocking spastic movement of thumb difficulty with maintaining position despite multiple attempts to create a wedge position.  Patient seen for picking up [facility] desks from tabletop and placing integrated with cues, verbal and tactile at times to facilitate three-point prehension grasping pattern.  Recommended patient utilize weightbearing techniques to right hand on tabletop in between repetitions to inhibit spasticity.   Facilitation right hand grasp on and off water bottle as well as Styrofoam cup with trials of weightbearing in between.   PATIENT EDUCATION: Education details: HEP progression, grasp/release activities; wrist flex against gravity, lateral and 3 point pinch with foam dowel Person educated: Patient Education method: Explanation Education comprehension: verbalized and demonstrated understanding.      OT Short Term Goals -      OT SHORT TERM GOAL #1   Title Pt. wil demonstrtae independence with HEPs    Baseline Eval: Pt. currently does not have one, 10th visit:  changes in HEP ongoing; 20th: instructed in putty exercises, changes ongoing; 40th: continual updates to HEP as pt progresses; 50th: continual updates; 01/22/22: continual updates   Time 6     Period Weeks    Status On-going    Target Date 03/05/22             OT Long Term Goals -       OT LONG TERM GOAL #1   Title Pt. will improve RUE strength by 2 mm grades to assist with ADLs, and IADLs,    Baseline Eval: Right shoulder flexion, abduction 3/5, elbow flexion, extension wrist extension 3+/5, 10th visit: improving with RUE strength by not yet met goal; 20th visit: R shd flex/abd 4-, elbow flex/ext 4+, wrist flex 4-, wrist ext 4+; 40:  R shd flex/abd 4,  elbow flex/ext 4+, wrist flex 4, wrist ext 4+; 50th visit 8/22: R shoulder flex/abd 4/5, elbow flex/ext 5/5, wrist flex 4, wrist ext 4+; 01/22/22: shoulder flex/abd 4/5,  elbow flex/ext 5/5, wrist flex 4, wrist ext 4+,    Time 12    Period Weeks    Status On-going    Target Date 04/16/22      OT LONG TERM GOAL #2   Title Pt. will improve right grip by 10 lbs to prepare for firmly holding objects for IADLs.    Baseline Eval: R: 38#, L: 124# (In 3rd dynamometer slot); R 35# in 4th slot, 28# in 3rd slot, 40th: 47 in 4th slot; 50th: 12/11/21: 46# on 3rd slot; 01/22/22: 30# on 3rd slot   Time 12    Period Weeks    Status On-going    Target Date 04/16/22      OT LONG TERM GOAL #3   Title Pt. will improve right lateral pinch strength by 5# to assist with cutting food    Baseline Eval: R: 17, L: 25; 20th: R: 18; 40th: 15# fingers slipping on meter, 50th: 9# fingers slipping; 01/22/22: 12# fingers slipping   Time 12    Period Weeks    Status On-going    Target Date 04/16/22      OT LONG TERM GOAL #4   Title Pt. will improve Right 3pt pinch by 2# to be able to hold/open items for cooking    Baseline Eval: R: Pt. unable to engage thumb L: 29#; 20th: R unable; 40th:  unable, fingers slipping; 50th:  unable, fingers slipping; 01/22/22: fingers slipping but pt is now able to grasp a ball peg with 3 point pinch pattern   Time 12    Period Weeks    Status On-going    Target Date 04/16/22      OT LONG TERM GOAL #5   Title Pt. will  improve right hand [facility] skills to be able to independently manipulate buttons, and zippers.    Baseline Eval: Pt. has difficulty managing buttons,a nd zippers. 10th visit:  improving; 20th: unable to engage R hand effectively, performs 1 handed with L; 50th: performs L handed only; 01/22/22: pt can use R hand as a stabilizer with clothing fasteners with mod vc   Time 12    Period Weeks    Status On-going    Target Date 04/16/22      OT LONG TERM GOAL #6   Title Pt. will independently write his name    Baseline Eval: Pt. is unable to hold a pen; 20th: pt can grip PenAgain with R hand and can make marks on paper with difficulty, but not yet writing name; 40th:  continues to demonstrate difficulty with grasp of pen; 50th 12/11/21: With assist to set up hand on pen again, pt was able to write first name and initial of last name in 1 1/4" tall letters with fair legibility (pt wants to defer this goal, states this is low on his list of priorities for now)   Time 12    Period Weeks    Status Defer   Target Date 04/16/22      OT LONG TERM GOAL #7   Title Pt. will improve FOTO score by 2 points to reflect funational improvement    Baseline Eval: FOTO score 46 with TR score 52; 20th: 45; 40th: 65;  50th: 46; 01/22/22: 50   Time 12    Period Weeks    Status On-going    Target Date 04/16/22             Plan -    Clinical Impression Statement Patient continues to demonstrate  increased wrist spasticity and has limited finger abduction and rotation of thumb at MCP and IP joint.  Recommend patient utilize weightbearing technique with attempts to pick up and manipulate items to manage spasticity.  May consider kinesiotaping and/or strapping of the thumb to facilitate grasping patterns during future sessions.  Continue to work towards removing right hand function for necessary daily tasks and facilitating movement of thumb and digits for manipulation of items as well as inhibition of spasticity in right hand  and wrist.   OT Occupational Profile and History Detailed Assessment- Review of Records and additional review of physical, cognitive, psychosocial history related to current functional performance    Occupational performance deficits (Please refer to evaluation for details): ADL's;IADL's;Education    Rehab Potential Good    Clinical Decision Making Several treatment options, min-mod task modification necessary    Comorbidities Affecting Occupational Performance: May have comorbidities impacting occupational performance    Modification or Assistance to Complete Evaluation  Min-Moderate modification of tasks or assist with assess necessary to complete eval    OT Frequency 2x / week    OT Duration 12 weeks    OT Treatment/Interventions Self-care/ADL training;DME and/or AE instruction;Therapeutic exercise;Ultrasound;Neuromuscular education;Therapeutic activities;Energy conservation;Moist Heat;Patient/family education;Splinting;Functional Mobility Training;Paraffin    Plan Neuro re-ed and therapeutic exercise    Consulted and Agree with Plan of Care Patient             Kimorah Ridolfi Colette Ribas, OT 02/06/2022, 3:29 PM

## 2022-02-08 ENCOUNTER — Ambulatory Visit: Payer: Medicare Other

## 2022-02-08 ENCOUNTER — Encounter: Payer: Medicare HMO | Admitting: Speech Pathology

## 2022-02-08 DIAGNOSIS — M6281 Muscle weakness (generalized): Secondary | ICD-10-CM

## 2022-02-08 DIAGNOSIS — R278 Other lack of coordination: Secondary | ICD-10-CM

## 2022-02-08 DIAGNOSIS — Z8673 Personal history of transient ischemic attack (TIA), and cerebral infarction without residual deficits: Secondary | ICD-10-CM

## 2022-02-08 DIAGNOSIS — M545 Low back pain, unspecified: Secondary | ICD-10-CM

## 2022-02-08 NOTE — Therapy (Signed)
[place] MAIN [facility] SERVICES [address] [place], [place], [address] Phone: [phone]   Fax:  [phone]  Patient Details  Name: David Reeves MRN: [id number] Date of Birth: 03/01/1971 Referring Provider:  [name], PA-C  Encounter Date: 02/08/2022   OUTPATIENT PHYSICAL THERAPY TREATMENT NOTE      Patient Name: David Reeves MRN: [id number] DOB:1970/07/14, 51 y.o., male Today's Date: 02/08/2022  PCP: [name], PA-C REFERRING PROVIDER: [name], PA-C   PT End of Session - 02/08/22 1126     Visit Number 54    Number of Visits 18    Date for PT Re-Evaluation 04/26/22    Authorization Type [employer]    Progress Note Due on Visit 20    PT Start Time 1031    PT Stop Time 1116    PT Time Calculation (min) 45 min    Equipment Utilized During Treatment Gait belt    Activity Tolerance Patient tolerated treatment well;No increased pain    Behavior During Therapy [facility] for tasks assessed/performed                             Past Medical History:  Diagnosis Date   Back pain 04/22/2012   Bone spur    Bulging disc 04/22/2012   Degenerative disc disease    Osteoarthritis    Panic anxiety syndrome    Stroke ([place]) 10/2020   Taking multiple medications for chronic disease    Past Surgical History:  Procedure Laterality Date   CYSTECTOMY     head   HERNIA REPAIR Left 2006,2014   [facility]   TOE SURGERY Right 2007   Patient Active Problem List   Diagnosis Date Noted   Alterations of sensations following cerebrovascular accident 03/08/2021   Aphasia as late effect of cerebrovascular accident (CVA) 03/08/2021   Muscle spasm 02/22/2021   Acute non-recurrent frontal sinusitis 02/22/2021   Chronic pain syndrome 01/23/2021   Primary hypertension 01/23/2021   Nerve pain 01/23/2021   Erectile dysfunction due to diseases classified elsewhere 01/23/2021   Cognitive dysfunction  01/23/2021   Scrotal edema 01/23/2021   Elevated LDL cholesterol level 01/23/2021   History of stroke with residual deficit 01/23/2021   Primary insomnia 01/23/2021   Mouth pain 01/23/2021   Laceration of right hand without foreign body    Closed compression fracture of L1 vertebra ([place]) 05/24/2016   Lumbar stenosis with neurogenic claudication 11/01/2015   Chronic bilateral low back pain with right-sided sciatica 11/08/2014   Hx of hemorrhoids 11/08/2014   AAA (abdominal aortic aneurysm) ([place]) 09/22/2014   Chronic pain associated with significant psychosocial dysfunction 09/22/2014   Panic disorder 09/22/2014   AB (asthmatic bronchitis) 08/17/2014   Anxiety 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 08/17/2014   Backache 08/17/2014   Lumbosacral spondylosis without myelopathy 08/17/2014   Disorder of male genital organs 08/17/2014   Brash 08/17/2014   Low back pain 08/17/2014   Tendon nodule 08/17/2014   Episodic paroxysmal anxiety disorder 08/17/2014   Hernia, inguinal, right 08/17/2014   Fast heart beat 08/17/2014   Illness 08/17/2014   Inguinal hernia 10/14/2012    REFERRING DIAG: History of stroke with residual deficit, chronic bilateral low back pain with right-sided sciatica, Lumbosacral spondylosis without myelopathy THERAPY DIAG:  Other lack of coordination  Muscle weakness (generalized)  Chronic bilateral low back pain without sciatica  Rationale for Evaluation and Treatment Rehabilitation  PERTINENT HISTORY: Pt. is  a 51 y.o. who was diagnosed with a CVA on July 21st, 2022. Pt. completed several weeks of inpatient rehab at [facility]. After returning to home. He was discharged in late August/early September and recieved home health PT. Pt. sustained a fall in december of 2022, and was admitted to the hospital with COVID-19, and Chronic back pain. He reports chronic back pain syndrome since 2012. Since the most recent discharge, Pt. has been residing with the ex-wife until the  Pt. is ready to return to independent living. He did recieve 2 weeks of home health after discharge in December 2022. He is now being referred to outpatient PT to address weakness from stroke and improve fine motor movement. He reports rarely getting numbness/tingling in RLE. PMHx includes: Bilateral LBP with right sided sciatica, Lumbosacral spondylosis myelopathy, closed compression Fx of L1. Has chronic insomnia. osteoarthritis, Panic anxiety syndrome (taking medication) Pt. enjoys cooking, and riding motorcycles. Patient is going to [facility] for Epidural spinal injections on 06/07/21;  PRECAUTIONS: Fall, Spinal Brace wears daily, approximately 25%, when exercising; semi-rigid lumbar brace  SUBJECTIVE: Pt feeling good today, has pain patch on. Rates back pain 1/10  PAIN:  Are you having pain? Yes: NPRS scale: 1/10 Pain location: bilat low back Pain description: dull ache; always constant,  Aggravating factors: worse with sitting,  Relieving factors: heat helps temporarily, pain pills/pain patch   TODAY'S TREATMENT 02/08/22   TherEx-  Leg press: 70# 10x  100# 10x 115# 5x  130# 4x Rates easy 150# 7x easy until last 3 reps medium  Matrix cable machine hamstring curls RLE- 12.5# 10x Rates easy 17.5# 10x. Rates easy  22.5# 10x. Rates Medium  LLE- 17.5# 10x 22.5# 10x 27.5# 10x  Seated LAQ with 5# weights 2x10 each LE. Rates as easy, but is fatigued   Heel raises 2x15 B   Standing hip abd 10x each LE - no increase in pain  Mat table- LTR 10x each side Green p.ball hamstring curls 20x  Progressed to performing with a glute bridge 10x  SLR 20x each LE no AW  No pain with interventions.  PATIENT EDUCATION:  Education details: Pt educated throughout session about proper posture and technique with exercises. Improved exercise technique, movement at target joints, use of target muscles after min to mod verbal, visual, tactile cues. Goals, plan.  Person educated:  Patient Education method: Explanation, Demonstration, Tactile cues, and Verbal cues Education comprehension: verbalized understanding, returned demonstration, verbal cues required, tactile cues required, and needs further education   HOME EXERCISE PROGRAM:   No updates on this date pt to continue HEP as previously given     PT Short Term Goals -       PT SHORT TERM GOAL #1   Title Patient will be adherent to HEP at least 3x a week to improve functional strength and balance for better safety at home.    Baseline 4/4: doing them 1-2x a week; 08/20/2021=Patient reports performing his low back stretching and no questions currently HEP. 10/24/21: doing HEP 4x a week   Time 4    Period Weeks    Status Achieved    Target Date 08/21/21      PT SHORT TERM GOAL #2   Title Patient (< 46 years old) will complete five times sit to stand test in < 15 seconds indicating an increased LE strength and improved balance.    Baseline 4/4: 17.85 sec with arms across chest; 08/20/2021= 17.6 sec , 7/5: 12.85 sec  Time 4    Period Weeks    Status Achieved;    Target Date 10/01/21              PT Long Term Goals -  TARGET DATE FOR GOALS 04/26/2022      PT LONG TERM GOAL #1   Title Patient will increase six minute walk test distance to >1000 for progression to community ambulator and improve gait ability    Baseline 4/4: 1115 feet , 7/5: 1080 feet;    Time 4    Period Weeks    Status Achieved    Target Date 08/21/21      PT LONG TERM GOAL #2   Title Patient will ascend/descend 8 stairs without rail assist independently without loss of balance to improve ability to get in/out of home.    Baseline 4/4: requires 1 rail assist; 08/20/2021- Patient demonstrated steps without railing using reciprocal steps today. 7/5: ascend/descend 6 steps reciprocally without rail, but required CGA for safety when descending; 7/25: Able to complete without UE support and good balance going up steps, unsafe descending steps  without UE support. Required min assist and UE support to prevent LOB descending.; 8/15: requires UUE support to descend and close CGA due to; unsteadiness; 9/26: ascend without UE support, recip pattern; descend with UUE support, recip stepping, but still doesn't feel steady yet with descending; 10/13 able to demo recip pattern ascending hands free, required instance of UE support with descending, recip pattern due to heel getting caught under step   Time 12*corrected   Period Weeks    Status Partially met   Target Date 02/05/2022      PT LONG TERM GOAL #3   Title Patient will increase BLE gross strength to 4+/5 as to improve functional strength for independent gait, increased standing tolerance and increased ADL ability.    Baseline 4/4: not formally assessed; 08/20/2021= 4+/5 with Right hip flex/knee ext/flex; Right hip abd= 4/5, 7/5: 4+/5 hip    Time 12    Period Weeks    Status Achieved;    Target Date 11/12/21      PT LONG TERM GOAL #4   Title Patient will improve FOTO score to >50% to indicate improved functional mobility with less pain with ADLs.    Baseline 4/4: 35%; 08/20/2021= 42% 7/5: 47%; 7:25:recently addressed; 11/30/21: 35; 9/26: 54%    Time 12    Period Weeks    Status MET   Target Date      PT LONG TERM GOAL #5   Title Patient will report a worst pain of 4/10 in low back over last week to indicate improved tolerance with ADLs.    Baseline 4/4: 7/10; 08/20/2021= 4/10 , 7/5: worst 7/10 in low back; 7/25: 6/10 ; 8/15: pt reports 6/10; 9/26: 4/10   Time 12    Period Weeks    Status Achieved    Target Date 02/05/2022      Additional Long Term Goals   Additional Long Term Goals Yes      PT LONG TERM GOAL #6   Title Pt will increase 10MWT by at least 0.13 m/s in order to demonstrate clinically significant improvement in community ambulation    Baseline 08/20/2021=0.94 m/s 7/5: 1.5 m/s   Time 12    Period Weeks    Status Achieved;    Target Date 11/12/21      PT LONG TERM  GOAL #7   Title Patient will demonstrate improved static standing balance as  seen by ability to single leg stance on right LE > 12 sec consistently for optimal balance on level and unlevel surfaces.    Baseline 08/20/2021= 4 sec SLS on right , 7/5: 4-5 sec inconsistent; 7/25 able to achieve 15 sec after multiple attempts of 4-5 sec holds; 8/15: LLE 15 sec, RLE 8 sec; 9/26: LLE 30 sec, RLE 17 seconds   Time 12    Period Weeks    Status MET   Target Date 02/05/2022    PT LONG TERM GOAL #8  Title Pt will report ability to ambulate for at least 15 minutes without an increase in his LBP in order to improve community participation and ease with ADLs  Baseline 9/26: increase in pain at 15 min mark; 10/13: reports about 20 minutes.   Time 3   Period Weeks   Status MET  Target Date 02/05/2022        PT LONG TERM GOAL #9  Title Pt will report ability to ambulate for at least 45 minutes without an increase in his LBP in order to improve community participation and ease with ADLs  Baseline 9/26: increase in pain at 15 min mark; 10/13: reports about 20 minutes.   Time 3   Period Weeks   Status NEW  Target Date        Plan -     Clinical Impression Statement Pt continues to progress to more challenging LE strengthening interventions (in seated and standing) with increased weight and without increase from baseline pain. While he makes progress, he still exhibit LE strength impairment and reports gait still limited due to LBP. The pt will benefit from further skilled PT to improve strength and balance, and decrease pain levels.   Personal Factors and Comorbidities Comorbidity 3+;Past/Current Experience;Time since onset of injury/illness/exacerbation    Comorbidities PMHx includes: Bilateral LBP with right sided sciatica, Lumbosacral spondylosis myelopathy, closed compression Fx of L1. Has chronic insomnia. osteoarthritis, Panic anxiety syndrome (taking medication)    Examination-Activity Limitations  Bend;Carry;Locomotion Level;Sit;Squat;Stairs;Stand;Transfers    Examination-Participation Restrictions Cleaning;Community Activity;Driving;Laundry;Meal Prep;Occupation;Shop;Volunteer;Yard Work    Stability/Clinical Decision Making Stable/Uncomplicated    Rehab Potential Good    PT Frequency 2x / week    PT Duration 12 weeks    PT Treatment/Interventions Cryotherapy;Electrical Stimulation;Moist Heat;Traction;Gait training;Stair training;Functional mobility training;Therapeutic activities;Therapeutic exercise;Balance training;Neuromuscular re-education;Patient/family education;Manual techniques;Passive range of motion;Dry needling;Energy conservation    PT Next Visit Plan  Progress core stabilization, Manual therapy for low back and LE ROM, instruct in balance HEP, continue plan       Consulted and Agree with Plan of Care Patient              11:27 AM, 02/08/22   Zollie Pee, PT 02/08/2022, 11:27 AM  [place] MAIN [facility] SERVICES [address] [place], [place], [address] Phone: [phone]   Fax:  [phone]

## 2022-02-10 NOTE — Therapy (Signed)
OUTPATIENT OCCUPATIONAL THERAPY NEURO TREATMENT NOTE   Patient Name: David Reeves MRN: [id number] DOB:1971-02-28, 51 y.o., male Today's Date: 02/10/2022  PCP: [name], PA-C REFERRING PROVIDER: [name], PA-C   OT End of Session - 02/10/22 1736     Visit Number 73    Number of Visits 38    Date for OT Re-Evaluation 04/16/22    Authorization Type Progress reporting period starting 01/22/22    OT Start Time 0932    OT Stop Time 1015    OT Time Calculation (min) 43 min    Activity Tolerance Patient tolerated treatment well    Behavior During Therapy [facility] for tasks assessed/performed               Past Medical History:  Diagnosis Date   Back pain 04/22/2012   Bone spur    Bulging disc 04/22/2012   Degenerative disc disease    Osteoarthritis    Panic anxiety syndrome    Stroke ([place]) 10/2020   Taking multiple medications for chronic disease    Past Surgical History:  Procedure Laterality Date   CYSTECTOMY     head   HERNIA REPAIR Left 2006,2014   [facility]   TOE SURGERY Right 2007   Patient Active Problem List   Diagnosis Date Noted   Alterations of sensations following cerebrovascular accident 03/08/2021   Aphasia as late effect of cerebrovascular accident (CVA) 03/08/2021   Muscle spasm 02/22/2021   Acute non-recurrent frontal sinusitis 02/22/2021   Chronic pain syndrome 01/23/2021   Primary hypertension 01/23/2021   Nerve pain 01/23/2021   Erectile dysfunction due to diseases classified elsewhere 01/23/2021   Cognitive dysfunction 01/23/2021   Scrotal edema 01/23/2021   Elevated LDL cholesterol level 01/23/2021   History of stroke with residual deficit 01/23/2021   Primary insomnia 01/23/2021   Mouth pain 01/23/2021   Laceration of right hand without foreign body    Closed compression fracture of L1 vertebra ([place]) 05/24/2016   Lumbar stenosis with neurogenic claudication 11/01/2015   Chronic bilateral low back pain with right-sided  sciatica 11/08/2014   Hx of hemorrhoids 11/08/2014   AAA (abdominal aortic aneurysm) ([place]) 09/22/2014   Chronic pain associated with significant psychosocial dysfunction 09/22/2014   Panic disorder 09/22/2014   AB (asthmatic bronchitis) 08/17/2014   Anxiety 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 08/17/2014   Backache 08/17/2014   Lumbosacral spondylosis without myelopathy 08/17/2014   Disorder of male genital organs 08/17/2014   Brash 08/17/2014   Low back pain 08/17/2014   Tendon nodule 08/17/2014   Episodic paroxysmal anxiety disorder 08/17/2014   Hernia, inguinal, right 08/17/2014   Fast heart beat 08/17/2014   Illness 08/17/2014   Inguinal hernia 10/14/2012    ONSET DATE: 11/09/2020  REFERRING DIAG: CVA  THERAPY DIAG:  Muscle weakness (generalized)  Other lack of coordination  H/O ischemic left MCA stroke  Rationale for Evaluation and Treatment Rehabilitation  PERTINENT HISTORY: Pt. is a 51 y.o. who was diagnosed with a CVA on July 21st, 2022. Pt. completed several weeks of  inpatient rehab at [facility]. After returning to home, Pt. sustained a fall in december of 2022, and was admitted to the hospital with COVI-19, and back pain from the fall. Since the most recent discharge, Pt. has been residing with the ex-wife until the Pt. is ready to return to independent living. Pt. PMHx includes: Bilateral LBP with right sided sciatica, Lumbosacral spondylosis myelopathy, closed compression Fx of L1. Pt. enjoys cooking, and riding motorcycles.  PRECAUTIONS: Fall  SUBJECTIVE: Pt reports that he got some new hand exercisers and has been working on them at home for strengthening his hand. PAIN:  Are you having pain? Yes: NPRS scale: 1/10 Pain location: low back Pain description: aching Aggravating factors: prolonged sitting or lying down Relieving factors: rest, heat, walking  [facility] OT Assessment        12/11/21 01/22/22            Observation/Other Assessments     Focus on  Therapeutic Outcomes (FOTO)   46 50            Coordination     Right 9 Hole Peg Test unable  unable    Left 9 Hole Peg Test 30 sec  30 sec            Hand Function     Right Hand Grip (lbs) 35 (3rd setting) 30 lb (3rd);  41 lb (4th setting),     Right Hand Lateral Pinch 9 lbs   slipping 12 lbs (thumb slips)    Right Hand 3 Point Pinch   unable to position thumb to produce 3 pt pinch Unable to position thumb to produce 3 point pinch    Left Hand Grip (lbs) 116      Left Hand Lateral Pinch 29 lbs      Left 3 point pinch 27 lbs        OBJECTIVE:  TODAY'S TREATMENT:  Therapeutic Exercise: Participated in place and hold for thumb radial abd, resisted lateral pinch pushing into foam dowel, active assisted palmar abd x2 sets 7-10 reps each.  Performed active digit abd and active assisted add, working to strengthen intrinsics x3 sets 7-10 each.  Facilitated 3 point pinch, pushing into foam dowel, with OT providing set up and stabilization of digits in 3 point pinch pattern while pt pinched into dowel for 2 sets 10 reps each.  Practiced active opening/closing of fist with pt requiring OT assist to delay thumb to prevent thumb tucking.  Pt able to sequence this on his own on his 3rd set of 10.    Neuro re-ed: Facilitated 3 point pinch prehension patterns working to grasp flat, circular wooden blocks placed flat on table and on sides, working to lift and drop into container on table top using R hand.  Pt required multiple trials for pick up, min vc for modulation of pinch strength to reduce thumb slipping, but improving with consistency and fewer rest breaks in between each block.  PATIENT EDUCATION: Education details: HEP progression, grasp/release activities; wrist flex against gravity, lateral and 3 point pinch with foam dowel Person educated: Patient Education method: Explanation Education comprehension: verbalized and demonstrated understanding.      OT Short Term Goals -      OT SHORT  TERM GOAL #1   Title Pt. wil demonstrtae independence with HEPs    Baseline Eval: Pt. currently does not have one, 10th visit:  changes in HEP ongoing; 20th: instructed in putty exercises, changes ongoing; 40th: continual updates to HEP as pt progresses; 50th: continual updates; 01/22/22: continual updates   Time 6    Period Weeks    Status On-going    Target Date 03/05/22             OT Long Term Goals -       OT LONG TERM GOAL #1   Title Pt. will improve RUE strength by 2 mm grades to assist with ADLs, and IADLs,  Baseline Eval: Right shoulder flexion, abduction 3/5, elbow flexion, extension wrist extension 3+/5, 10th visit: improving with RUE strength by not yet met goal; 20th visit: R shd flex/abd 4-, elbow flex/ext 4+, wrist flex 4-, wrist ext 4+; 40:  R shd flex/abd 4,  elbow flex/ext 4+, wrist flex 4, wrist ext 4+; 50th visit 8/22: R shoulder flex/abd 4/5, elbow flex/ext 5/5, wrist flex 4, wrist ext 4+; 01/22/22: shoulder flex/abd 4/5, elbow flex/ext 5/5, wrist flex 4, wrist ext 4+,    Time 12    Period Weeks    Status On-going    Target Date 04/16/22      OT LONG TERM GOAL #2   Title Pt. will improve right grip by 10 lbs to prepare for firmly holding objects for IADLs.    Baseline Eval: R: 38#, L: 124# (In 3rd dynamometer slot); R 35# in 4th slot, 28# in 3rd slot, 40th: 47 in 4th slot; 50th: 12/11/21: 46# on 3rd slot; 01/22/22: 30# on 3rd slot   Time 12    Period Weeks    Status On-going    Target Date 04/16/22      OT LONG TERM GOAL #3   Title Pt. will improve right lateral pinch strength by 5# to assist with cutting food    Baseline Eval: R: 17, L: 25; 20th: R: 18; 40th: 15# fingers slipping on meter, 50th: 9# fingers slipping; 01/22/22: 12# fingers slipping   Time 12    Period Weeks    Status On-going    Target Date 04/16/22      OT LONG TERM GOAL #4   Title Pt. will improve Right 3pt pinch by 2# to be able to hold/open items for cooking    Baseline Eval: R: Pt.  unable to engage thumb L: 29#; 20th: R unable; 40th:  unable, fingers slipping; 50th:  unable, fingers slipping; 01/22/22: fingers slipping but pt is now able to grasp a ball peg with 3 point pinch pattern   Time 12    Period Weeks    Status On-going    Target Date 04/16/22      OT LONG TERM GOAL #5   Title Pt. will improve right hand [facility] skills to be able to independently manipulate buttons, and zippers.    Baseline Eval: Pt. has difficulty managing buttons,a nd zippers. 10th visit:  improving; 20th: unable to engage R hand effectively, performs 1 handed with L; 50th: performs L handed only; 01/22/22: pt can use R hand as a stabilizer with clothing fasteners with mod vc   Time 12    Period Weeks    Status On-going    Target Date 04/16/22      OT LONG TERM GOAL #6   Title Pt. will independently write his name    Baseline Eval: Pt. is unable to hold a pen; 20th: pt can grip PenAgain with R hand and can make marks on paper with difficulty, but not yet writing name; 40th:  continues to demonstrate difficulty with grasp of pen; 50th 12/11/21: With assist to set up hand on pen again, pt was able to write first name and initial of last name in 1 1/4" tall letters with fair legibility (pt wants to defer this goal, states this is low on his list of priorities for now)   Time 12    Period Weeks    Status Defer   Target Date 04/16/22      OT LONG TERM GOAL #7   Title Pt. will  improve FOTO score by 2 points to reflect funational improvement    Baseline Eval: FOTO score 46 with TR score 52; 20th: 45; 40th: 23;  50th: 46; 01/22/22: 50   Time 12    Period Weeks    Status On-going    Target Date 04/16/22             Plan -    Clinical Impression Statement Facilitated 3 point pinch prehension patterns working to grasp flat, circular wooden blocks placed flat on table and on sides, working to lift and drop into container on table top using R hand.  Pt required multiple trials for pick up, min vc for  modulation of pinch strength to reduce thumb slipping, but improving with consistency and fewer rest breaks in between each block.  Practiced active opening/closing of fist with pt requiring OT assist to delay thumb to prevent thumb tucking.  Pt able to sequence this on his own on his 3rd set of 10.  Pt continues to remain diligent with his HEP and exercise progressions.  Pt will continue to work towards neuro re-ed of right hand with emphasis on thumb movements to integrate into functional grasping and manipulation skills.    OT Occupational Profile and History Detailed Assessment- Review of Records and additional review of physical, cognitive, psychosocial history related to current functional performance    Occupational performance deficits (Please refer to evaluation for details): ADL's;IADL's;Education    Rehab Potential Good    Clinical Decision Making Several treatment options, min-mod task modification necessary    Comorbidities Affecting Occupational Performance: May have comorbidities impacting occupational performance    Modification or Assistance to Complete Evaluation  Min-Moderate modification of tasks or assist with assess necessary to complete eval    OT Frequency 2x / week    OT Duration 12 weeks    OT Treatment/Interventions Self-care/ADL training;DME and/or AE instruction;Therapeutic exercise;Ultrasound;Neuromuscular education;Therapeutic activities;Energy conservation;Moist Heat;Patient/family education;Splinting;Functional Mobility Training;Paraffin    Plan Neuro re-ed and therapeutic exercise    Consulted and Agree with Plan of Care Patient             Leta Speller, MS, OTR/L   Darleene Cleaver, OT 02/10/2022, 5:38 PM

## 2022-02-12 ENCOUNTER — Ambulatory Visit: Payer: Medicare Other

## 2022-02-12 ENCOUNTER — Encounter: Payer: Medicare HMO | Admitting: Speech Pathology

## 2022-02-12 DIAGNOSIS — M6281 Muscle weakness (generalized): Secondary | ICD-10-CM

## 2022-02-12 DIAGNOSIS — R278 Other lack of coordination: Secondary | ICD-10-CM

## 2022-02-12 DIAGNOSIS — Z8673 Personal history of transient ischemic attack (TIA), and cerebral infarction without residual deficits: Secondary | ICD-10-CM

## 2022-02-12 NOTE — Therapy (Signed)
OUTPATIENT OCCUPATIONAL THERAPY NEURO TREATMENT NOTE   Patient Name: David Reeves MRN: [id number] DOB:1971-03-28, 51 y.o., male Today's Date: 02/12/2022  PCP: [name], PA-C REFERRING PROVIDER: [name], PA-C   OT End of Session - 02/12/22 0946     Visit Number 65    Number of Visits 54    Date for OT Re-Evaluation 04/16/22    Authorization Type Progress reporting period starting 01/22/22    OT Start Time 0920    OT Stop Time 1000    OT Time Calculation (min) 40 min    Activity Tolerance Patient tolerated treatment well    Behavior During Therapy [facility] for tasks assessed/performed               Past Medical History:  Diagnosis Date   Back pain 04/22/2012   Bone spur    Bulging disc 04/22/2012   Degenerative disc disease    Osteoarthritis    Panic anxiety syndrome    Stroke ([place]) 10/2020   Taking multiple medications for chronic disease    Past Surgical History:  Procedure Laterality Date   CYSTECTOMY     head   HERNIA REPAIR Left 2006,2014   [facility]   TOE SURGERY Right 2007   Patient Active Problem List   Diagnosis Date Noted   Alterations of sensations following cerebrovascular accident 03/08/2021   Aphasia as late effect of cerebrovascular accident (CVA) 03/08/2021   Muscle spasm 02/22/2021   Acute non-recurrent frontal sinusitis 02/22/2021   Chronic pain syndrome 01/23/2021   Primary hypertension 01/23/2021   Nerve pain 01/23/2021   Erectile dysfunction due to diseases classified elsewhere 01/23/2021   Cognitive dysfunction 01/23/2021   Scrotal edema 01/23/2021   Elevated LDL cholesterol level 01/23/2021   History of stroke with residual deficit 01/23/2021   Primary insomnia 01/23/2021   Mouth pain 01/23/2021   Laceration of right hand without foreign body    Closed compression fracture of L1 vertebra ([place]) 05/24/2016   Lumbar stenosis with neurogenic claudication 11/01/2015   Chronic bilateral low back pain with right-sided  sciatica 11/08/2014   Hx of hemorrhoids 11/08/2014   AAA (abdominal aortic aneurysm) ([place]) 09/22/2014   Chronic pain associated with significant psychosocial dysfunction 09/22/2014   Panic disorder 09/22/2014   AB (asthmatic bronchitis) 08/17/2014   Anxiety 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 08/17/2014   Backache 08/17/2014   Lumbosacral spondylosis without myelopathy 08/17/2014   Disorder of male genital organs 08/17/2014   Brash 08/17/2014   Low back pain 08/17/2014   Tendon nodule 08/17/2014   Episodic paroxysmal anxiety disorder 08/17/2014   Hernia, inguinal, right 08/17/2014   Fast heart beat 08/17/2014   Illness 08/17/2014   Inguinal hernia 10/14/2012    ONSET DATE: 11/09/2020  REFERRING DIAG: CVA  THERAPY DIAG:  Muscle weakness (generalized)  Other lack of coordination  H/O ischemic left MCA stroke  Rationale for Evaluation and Treatment Rehabilitation  PERTINENT HISTORY: Pt. is a 51 y.o. who was diagnosed with a CVA on July 21st, 2022. Pt. completed several weeks of  inpatient rehab at [facility]. After returning to home, Pt. sustained a fall in december of 2022, and was admitted to the hospital with COVID-19, and back pain from the fall. Since the most recent discharge, Pt. has been residing with the ex-wife until the Pt. is ready to return to independent living. Pt. PMHx includes: Bilateral LBP with right sided sciatica, Lumbosacral spondylosis myelopathy, closed compression Fx of L1. Pt. enjoys cooking, and riding motorcycles.  PRECAUTIONS: Fall  SUBJECTIVE: Pt reports that his back is really bothering him today. PAIN:  Are you having pain? Yes: NPRS scale: 3/10 Pain location: low back Pain description: aching Aggravating factors: prolonged sitting or lying down Relieving factors: rest, heat, walking  [facility] OT Assessment        12/11/21 01/22/22            Observation/Other Assessments     Focus on Therapeutic Outcomes (FOTO)   46 50            Coordination      Right 9 Hole Peg Test unable  unable    Left 9 Hole Peg Test 30 sec  30 sec            Hand Function     Right Hand Grip (lbs) 35 (3rd setting) 30 lb (3rd);  41 lb (4th setting),     Right Hand Lateral Pinch 9 lbs   slipping 12 lbs (thumb slips)    Right Hand 3 Point Pinch   unable to position thumb to produce 3 pt pinch Unable to position thumb to produce 3 point pinch    Left Hand Grip (lbs) 116      Left Hand Lateral Pinch 29 lbs      Left 3 point pinch 27 lbs        OBJECTIVE:  TODAY'S TREATMENT:  Therapeutic Exercise: Participated in place and hold for thumb radial abd, resisted lateral pinch pushing into foam dowel, active assisted palmar abd x2 sets 7-10 reps each.  Performed active digit abd and active assisted add, working to strengthen intrinsics x3 sets 7-10 each.  Facilitated 3 point pinch, pushing into foam dowel, with OT providing set up and stabilization of digits in 3 point pinch pattern while pt pinched into dowel for 2 sets 10 reps each.  Practiced active opening/closing of fist with pt requiring OT assist to delay thumb to prevent thumb tucking on 50% of trials 3 sets 10 reps.    Neuro re-ed: Facilitated 3 point pinch prehension patterns working to grasp ball pegs from pegboard.  Pt required multiple trials for pick up, min vc for modulation of pinch strength to reduce thumb slipping, but improving with consistency after multiple repetitions and requiring fewer rest breaks in between each peg.    PATIENT EDUCATION: Education details: HEP progression, grasp/release activities; wrist flex against gravity, lateral and 3 point pinch with foam dowel Person educated: Patient Education method: Explanation Education comprehension: verbalized and demonstrated understanding.      OT Short Term Goals -      OT SHORT TERM GOAL #1   Title Pt. wil demonstrtae independence with HEPs    Baseline Eval: Pt. currently does not have one, 10th visit:  changes in HEP ongoing; 20th:  instructed in putty exercises, changes ongoing; 40th: continual updates to HEP as pt progresses; 50th: continual updates; 01/22/22: continual updates   Time 6    Period Weeks    Status On-going    Target Date 03/05/22             OT Long Term Goals -       OT LONG TERM GOAL #1   Title Pt. will improve RUE strength by 2 mm grades to assist with ADLs, and IADLs,    Baseline Eval: Right shoulder flexion, abduction 3/5, elbow flexion, extension wrist extension 3+/5, 10th visit: improving with RUE strength by not yet met goal; 20th visit: R shd flex/abd 4-, elbow  flex/ext 4+, wrist flex 4-, wrist ext 4+; 40:  R shd flex/abd 4,  elbow flex/ext 4+, wrist flex 4, wrist ext 4+; 50th visit 8/22: R shoulder flex/abd 4/5, elbow flex/ext 5/5, wrist flex 4, wrist ext 4+; 01/22/22: shoulder flex/abd 4/5, elbow flex/ext 5/5, wrist flex 4, wrist ext 4+,    Time 12    Period Weeks    Status On-going    Target Date 04/16/22      OT LONG TERM GOAL #2   Title Pt. will improve right grip by 10 lbs to prepare for firmly holding objects for IADLs.    Baseline Eval: R: 38#, L: 124# (In 3rd dynamometer slot); R 35# in 4th slot, 28# in 3rd slot, 40th: 47 in 4th slot; 50th: 12/11/21: 46# on 3rd slot; 01/22/22: 30# on 3rd slot   Time 12    Period Weeks    Status On-going    Target Date 04/16/22      OT LONG TERM GOAL #3   Title Pt. will improve right lateral pinch strength by 5# to assist with cutting food    Baseline Eval: R: 17, L: 25; 20th: R: 18; 40th: 15# fingers slipping on meter, 50th: 9# fingers slipping; 01/22/22: 12# fingers slipping   Time 12    Period Weeks    Status On-going    Target Date 04/16/22      OT LONG TERM GOAL #4   Title Pt. will improve Right 3pt pinch by 2# to be able to hold/open items for cooking    Baseline Eval: R: Pt. unable to engage thumb L: 29#; 20th: R unable; 40th:  unable, fingers slipping; 50th:  unable, fingers slipping; 01/22/22: fingers slipping but pt is now able to  grasp a ball peg with 3 point pinch pattern   Time 12    Period Weeks    Status On-going    Target Date 04/16/22      OT LONG TERM GOAL #5   Title Pt. will improve right hand [facility] skills to be able to independently manipulate buttons, and zippers.    Baseline Eval: Pt. has difficulty managing buttons,a nd zippers. 10th visit:  improving; 20th: unable to engage R hand effectively, performs 1 handed with L; 50th: performs L handed only; 01/22/22: pt can use R hand as a stabilizer with clothing fasteners with mod vc   Time 12    Period Weeks    Status On-going    Target Date 04/16/22      OT LONG TERM GOAL #6   Title Pt. will independently write his name    Baseline Eval: Pt. is unable to hold a pen; 20th: pt can grip PenAgain with R hand and can make marks on paper with difficulty, but not yet writing name; 40th:  continues to demonstrate difficulty with grasp of pen; 50th 12/11/21: With assist to set up hand on pen again, pt was able to write first name and initial of last name in 1 1/4" tall letters with fair legibility (pt wants to defer this goal, states this is low on his list of priorities for now)   Time 12    Period Weeks    Status Defer   Target Date 04/16/22      OT LONG TERM GOAL #7   Title Pt. will improve FOTO score by 2 points to reflect funational improvement    Baseline Eval: FOTO score 46 with TR score 52; 20th: 45; 40th: 44;  50th: 46; 01/22/22: 50  Time 12    Period Weeks    Status On-going    Target Date 04/16/22             Plan -    Clinical Impression Statement Pt is improving with active opening/closing of fist with pt requiring OT assist to delay thumb to prevent thumb tucking only on 50% of trials out of 3 sets 10 reps.  When working with 3 point pinch pattern to pick up ball pegs, pt is improving with consistency to pick up a peg without thumb slipping after multiple repetitions, and is requiring fewer rest breaks in between each peg pick up.  Pt still  requires light pressure to pick up a peg to limit thumb slipping.  Pt continues to require OT assist to sustain finger position in a 3 point pinch pattern if working to pinch with more forceful pressure, otherwise fingers slip from position.  Pt will continue to work towards neuro re-ed of right hand with emphasis on thumb movements to integrate into functional grasping and manipulation skills.    OT Occupational Profile and History Detailed Assessment- Review of Records and additional review of physical, cognitive, psychosocial history related to current functional performance    Occupational performance deficits (Please refer to evaluation for details): ADL's;IADL's;Education    Rehab Potential Good    Clinical Decision Making Several treatment options, min-mod task modification necessary    Comorbidities Affecting Occupational Performance: May have comorbidities impacting occupational performance    Modification or Assistance to Complete Evaluation  Min-Moderate modification of tasks or assist with assess necessary to complete eval    OT Frequency 2x / week    OT Duration 12 weeks    OT Treatment/Interventions Self-care/ADL training;DME and/or AE instruction;Therapeutic exercise;Ultrasound;Neuromuscular education;Therapeutic activities;Energy conservation;Moist Heat;Patient/family education;Splinting;Functional Mobility Training;Paraffin    Plan Neuro re-ed and therapeutic exercise    Consulted and Agree with Plan of Care Patient             Leta Speller, MS, OTR/L   Darleene Cleaver, OT 02/12/2022, 9:50 AM

## 2022-02-13 ENCOUNTER — Ambulatory Visit: Payer: Medicare Other

## 2022-02-15 ENCOUNTER — Encounter: Payer: Medicare HMO | Admitting: Speech Pathology

## 2022-02-15 ENCOUNTER — Ambulatory Visit: Payer: Medicare Other

## 2022-02-15 DIAGNOSIS — Z8673 Personal history of transient ischemic attack (TIA), and cerebral infarction without residual deficits: Secondary | ICD-10-CM

## 2022-02-15 DIAGNOSIS — R278 Other lack of coordination: Secondary | ICD-10-CM

## 2022-02-15 DIAGNOSIS — R262 Difficulty in walking, not elsewhere classified: Secondary | ICD-10-CM

## 2022-02-15 DIAGNOSIS — R269 Unspecified abnormalities of gait and mobility: Secondary | ICD-10-CM

## 2022-02-15 DIAGNOSIS — M6281 Muscle weakness (generalized): Secondary | ICD-10-CM

## 2022-02-15 DIAGNOSIS — R2681 Unsteadiness on feet: Secondary | ICD-10-CM

## 2022-02-15 NOTE — Therapy (Addendum)
[place] MAIN [facility] SERVICES [address] [place], [place], [address] Phone: [phone]   Fax:  [phone]  Patient Details  Name: David Reeves MRN: [id number] Date of Birth: 24-Feb-1971 Referring Provider:  [name], PA-C  Encounter Date: 02/15/2022   OUTPATIENT PHYSICAL THERAPY TREATMENT NOTE      Patient Name: David Reeves MRN: [id number] DOB:1970-10-04, 51 y.o., male Today's Date: 02/15/2022  PCP: [name], PA-C REFERRING PROVIDER: [name], PA-C   PT End of Session - 02/15/22 1015     Visit Number 55    Number of Visits 45    Date for PT Re-Evaluation 04/26/22    Authorization Type [employer]    Progress Note Due on Visit 77    PT Start Time 1014    PT Stop Time 1100    PT Time Calculation (min) 46 min    Equipment Utilized During Treatment Gait belt    Activity Tolerance Patient tolerated treatment well;No increased pain    Behavior During Therapy [facility] for tasks assessed/performed                 Past Medical History:  Diagnosis Date   Back pain 04/22/2012   Bone spur    Bulging disc 04/22/2012   Degenerative disc disease    Osteoarthritis    Panic anxiety syndrome    Stroke ([place]) 10/2020   Taking multiple medications for chronic disease    Past Surgical History:  Procedure Laterality Date   CYSTECTOMY     head   HERNIA REPAIR Left 2006,2014   [facility]   TOE SURGERY Right 2007   Patient Active Problem List   Diagnosis Date Noted   Alterations of sensations following cerebrovascular accident 03/08/2021   Aphasia as late effect of cerebrovascular accident (CVA) 03/08/2021   Muscle spasm 02/22/2021   Acute non-recurrent frontal sinusitis 02/22/2021   Chronic pain syndrome 01/23/2021   Primary hypertension 01/23/2021   Nerve pain 01/23/2021   Erectile dysfunction due to diseases classified elsewhere 01/23/2021   Cognitive dysfunction 01/23/2021   Scrotal edema  01/23/2021   Elevated LDL cholesterol level 01/23/2021   History of stroke with residual deficit 01/23/2021   Primary insomnia 01/23/2021   Mouth pain 01/23/2021   Laceration of right hand without foreign body    Closed compression fracture of L1 vertebra ([place]) 05/24/2016   Lumbar stenosis with neurogenic claudication 11/01/2015   Chronic bilateral low back pain with right-sided sciatica 11/08/2014   Hx of hemorrhoids 11/08/2014   AAA (abdominal aortic aneurysm) ([place]) 09/22/2014   Chronic pain associated with significant psychosocial dysfunction 09/22/2014   Panic disorder 09/22/2014   AB (asthmatic bronchitis) 08/17/2014   Anxiety 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 08/17/2014   Backache 08/17/2014   Lumbosacral spondylosis without myelopathy 08/17/2014   Disorder of male genital organs 08/17/2014   Brash 08/17/2014   Low back pain 08/17/2014   Tendon nodule 08/17/2014   Episodic paroxysmal anxiety disorder 08/17/2014   Hernia, inguinal, right 08/17/2014   Fast heart beat 08/17/2014   Illness 08/17/2014   Inguinal hernia 10/14/2012    REFERRING DIAG: History of stroke with residual deficit, chronic bilateral low back pain with right-sided sciatica, Lumbosacral spondylosis without myelopathy THERAPY DIAG:  Difficulty in walking, not elsewhere classified  Muscle weakness (generalized)  Other lack of coordination  Unsteadiness on feet  Abnormality of gait and mobility  Rationale for Evaluation and Treatment Rehabilitation  PERTINENT HISTORY: Pt. is a 51 y.o.  who was diagnosed with a CVA on July 21st, 2022. Pt. completed several weeks of inpatient rehab at [facility]. After returning to home. He was discharged in late August/early September and recieved home health PT. Pt. sustained a fall in december of 2022, and was admitted to the hospital with COVID-19, and Chronic back pain. He reports chronic back pain syndrome since 2012. Since the most recent discharge, Pt. has been  residing with the ex-wife until the Pt. is ready to return to independent living. He did recieve 2 weeks of home health after discharge in December 2022. He is now being referred to outpatient PT to address weakness from stroke and improve fine motor movement. He reports rarely getting numbness/tingling in RLE. PMHx includes: Bilateral LBP with right sided sciatica, Lumbosacral spondylosis myelopathy, closed compression Fx of L1. Has chronic insomnia. osteoarthritis, Panic anxiety syndrome (taking medication) Pt. enjoys cooking, and riding motorcycles. Patient is going to [facility] spine center for Epidural spinal injections on 06/07/21;  PRECAUTIONS: Fall, Spinal Brace wears daily, approximately 25%, when exercising; semi-rigid lumbar brace  SUBJECTIVE: Pt reports 1/10 pain today with use of the pain patch, valium and muscle relaxer.        PAIN:  Are you having pain? Yes: NPRS scale: 1/10 Pain location: bilat low back Pain description: dull ache; always constant,  Aggravating factors: worse with sitting,  Relieving factors: heat helps temporarily, pain pills/pain patch   TODAY'S TREATMENT 02/15/22   TherEx-  Leg press: 85# x10 100# x10 115# x10 130# x10 160# x10 pt rating increased difficulty towards the final bout  Matrix cable machine hamstring curls RLE-  17.5# x10 Rates easy  22.5# 2x10 Rates Medium   LLE- 17.5# x10 22.5# x10 27.5# x10  Seated LAQ with 5# weights 2x10 each LE. Rates as easy, but is fatigued   Heel raises 2x15 B with 3 sec holds at peak   Mat table: LTR with green physioball under LE's, 2x15 Supine hamstring curls with green physioball, 2x15 Supine bridges with feet on green physioball, 2x15  SLR 20x each LE no AW  No pain with interventions.  PATIENT EDUCATION:  Education details: Pt educated throughout session about proper posture and technique with exercises. Improved exercise technique, movement at target joints, use of target muscles after  min to mod verbal, visual, tactile cues. Goals, plan.  Person educated: Patient Education method: Explanation, Demonstration, Tactile cues, and Verbal cues Education comprehension: verbalized understanding, returned demonstration, verbal cues required, tactile cues required, and needs further education   HOME EXERCISE PROGRAM:  No updates on this date pt to continue HEP as previously given     PT Short Term Goals -       PT SHORT TERM GOAL #1   Title Patient will be adherent to HEP at least 3x a week to improve functional strength and balance for better safety at home.    Baseline 4/4: doing them 1-2x a week; 08/20/2021=Patient reports performing his low back stretching and no questions currently HEP. 10/24/21: doing HEP 4x a week   Time 4    Period Weeks    Status Achieved    Target Date 08/21/21      PT SHORT TERM GOAL #2   Title Patient (< 65 years old) will complete five times sit to stand test in < 15 seconds indicating an increased LE strength and improved balance.    Baseline 4/4: 17.85 sec with arms across chest; 08/20/2021= 17.6 sec , 7/5: 12.85 sec   Time  4    Period Weeks    Status Achieved;    Target Date 10/01/21              PT Long Term Goals -  TARGET DATE FOR GOALS 04/26/2022      PT LONG TERM GOAL #1   Title Patient will increase six minute walk test distance to >1000 for progression to community ambulator and improve gait ability    Baseline 4/4: 1115 feet , 7/5: 1080 feet;    Time 4    Period Weeks    Status Achieved    Target Date 08/21/21      PT LONG TERM GOAL #2   Title Patient will ascend/descend 8 stairs without rail assist independently without loss of balance to improve ability to get in/out of home.    Baseline 4/4: requires 1 rail assist; 08/20/2021- Patient demonstrated steps without railing using reciprocal steps today. 7/5: ascend/descend 6 steps reciprocally without rail, but required CGA for safety when descending; 7/25: Able to complete  without UE support and good balance going up steps, unsafe descending steps without UE support. Required min assist and UE support to prevent LOB descending.; 8/15: requires UUE support to descend and close CGA due to; unsteadiness; 9/26: ascend without UE support, recip pattern; descend with UUE support, recip stepping, but still doesn't feel steady yet with descending; 10/13 able to demo recip pattern ascending hands free, required instance of UE support with descending, recip pattern due to heel getting caught under step   Time 12*corrected   Period Weeks    Status Partially met   Target Date 02/05/2022      PT LONG TERM GOAL #3   Title Patient will increase BLE gross strength to 4+/5 as to improve functional strength for independent gait, increased standing tolerance and increased ADL ability.    Baseline 4/4: not formally assessed; 08/20/2021= 4+/5 with Right hip flex/knee ext/flex; Right hip abd= 4/5, 7/5: 4+/5 hip    Time 12    Period Weeks    Status Achieved;    Target Date 11/12/21      PT LONG TERM GOAL #4   Title Patient will improve FOTO score to >50% to indicate improved functional mobility with less pain with ADLs.    Baseline 4/4: 35%; 08/20/2021= 42% 7/5: 47%; 7:25:recently addressed; 11/30/21: 35; 9/26: 54%    Time 12    Period Weeks    Status MET   Target Date      PT LONG TERM GOAL #5   Title Patient will report a worst pain of 4/10 in low back over last week to indicate improved tolerance with ADLs.    Baseline 4/4: 7/10; 08/20/2021= 4/10 , 7/5: worst 7/10 in low back; 7/25: 6/10 ; 8/15: pt reports 6/10; 9/26: 4/10   Time 12    Period Weeks    Status Achieved    Target Date 02/05/2022      Additional Long Term Goals   Additional Long Term Goals Yes      PT LONG TERM GOAL #6   Title Pt will increase 10MWT by at least 0.13 m/s in order to demonstrate clinically significant improvement in community ambulation    Baseline 08/20/2021=0.94 m/s 7/5: 1.5 m/s   Time 12     Period Weeks    Status Achieved;    Target Date 11/12/21      PT LONG TERM GOAL #7   Title Patient will demonstrate improved static standing balance as seen  by ability to single leg stance on right LE > 12 sec consistently for optimal balance on level and unlevel surfaces.    Baseline 08/20/2021= 4 sec SLS on right , 7/5: 4-5 sec inconsistent; 7/25 able to achieve 15 sec after multiple attempts of 4-5 sec holds; 8/15: LLE 15 sec, RLE 8 sec; 9/26: LLE 30 sec, RLE 17 seconds   Time 12    Period Weeks    Status MET   Target Date 02/05/2022    PT LONG TERM GOAL #8  Title Pt will report ability to ambulate for at least 15 minutes without an increase in his LBP in order to improve community participation and ease with ADLs  Baseline 9/26: increase in pain at 15 min mark; 10/13: reports about 20 minutes.   Time 3   Period Weeks   Status MET  Target Date 02/05/2022        PT LONG TERM GOAL #9  Title Pt will report ability to ambulate for at least 45 minutes without an increase in his LBP in order to improve community participation and ease with ADLs  Baseline 9/26: increase in pain at 15 min mark; 10/13: reports about 20 minutes.   Time 3   Period Weeks   Status NEW  Target Date        Plan -     Clinical Impression Statement Pt continues to demonstrate increased ability to performed exercises and is able to tolerate increased weight/resistance the with exercises given.  Pt still noting that he is experiencing decreased balance with stairs specifically and would benefit from performing more balance related tasks during more dynamic tasks.  Will continue to monitor this as part of progression going forward.   Pt will continue to benefit from skilled therapy to address remaining deficits in order to improve overall QoL and return to PLOF.      Personal Factors and Comorbidities Comorbidity 3+;Past/Current Experience;Time since onset of injury/illness/exacerbation    Comorbidities PMHx  includes: Bilateral LBP with right sided sciatica, Lumbosacral spondylosis myelopathy, closed compression Fx of L1. Has chronic insomnia. osteoarthritis, Panic anxiety syndrome (taking medication)    Examination-Activity Limitations Bend;Carry;Locomotion Level;Sit;Squat;Stairs;Stand;Transfers    Examination-Participation Restrictions Cleaning;Community Activity;Driving;Laundry;Meal Prep;Occupation;Shop;Volunteer;Yard Work    Stability/Clinical Decision Making Stable/Uncomplicated    Rehab Potential Good    PT Frequency 2x / week    PT Duration 12 weeks    PT Treatment/Interventions Cryotherapy;Electrical Stimulation;Moist Heat;Traction;Gait training;Stair training;Functional mobility training;Therapeutic activities;Therapeutic exercise;Balance training;Neuromuscular re-education;Patient/family education;Manual techniques;Passive range of motion;Dry needling;Energy conservation    PT Next Visit Plan  Progress core stabilization, Manual therapy for low back and LE ROM, instruct in balance HEP, continue plan       Consulted and Agree with Plan of Care Patient             Gwenlyn Saran, PT, DPT Physical Therapist- [facility]  02/15/22, 12:20 PM   [place] MAIN [facility] SERVICES [address] [place], [place], [address] Phone: [phone]   Fax:  [phone]

## 2022-02-17 NOTE — Therapy (Signed)
OUTPATIENT OCCUPATIONAL THERAPY NEURO TREATMENT NOTE   Patient Name: David Reeves MRN: [id number] DOB:08/30/1970, 51 y.o., male Today's Date: 02/17/2022  PCP: Lindsay Drubel, PA-C REFERRING PROVIDER: Lindsay Drubel, PA-C   OT End of Session - 02/17/22 1919     Visit Number [id number]    Number of Visits 84    Date for OT Re-Evaluation 04/16/22    Authorization Type Progress reporting period starting 01/22/22    OT Start Time 0930    OT Stop Time 1000    OT Time Calculation (min) 30 min    Activity Tolerance Patient tolerated treatment well    Behavior During Therapy WFL for tasks assessed/performed               Past Medical History:  Diagnosis Date   Back pain 04/22/2012   Bone spur    Bulging disc 04/22/2012   Degenerative disc disease    Osteoarthritis    Panic anxiety syndrome    Stroke (HCC) 10/2020   Taking multiple medications for chronic disease    Past Surgical History:  Procedure Laterality Date   CYSTECTOMY     head   HERNIA REPAIR Left 2006,2014   [facility]   TOE SURGERY Right 2007   Patient Active Problem List   Diagnosis Date Noted   Alterations of sensations following cerebrovascular accident 03/08/2021   Aphasia as late effect of cerebrovascular accident (CVA) 03/08/2021   Muscle spasm 02/22/2021   Acute non-recurrent frontal sinusitis 02/22/2021   Chronic pain syndrome 01/23/2021   Primary hypertension 01/23/2021   Nerve pain 01/23/2021   Erectile dysfunction due to diseases classified elsewhere 01/23/2021   Cognitive dysfunction 01/23/2021   Scrotal edema 01/23/2021   Elevated LDL cholesterol level 01/23/2021   History of stroke with residual deficit 01/23/2021   Primary insomnia 01/23/2021   Mouth pain 01/23/2021   Laceration of right hand without foreign body    Closed compression fracture of L1 vertebra (HCC) 05/24/2016   Lumbar stenosis with neurogenic claudication 11/01/2015   Chronic bilateral low back pain with right-sided  sciatica 11/08/2014   Hx of hemorrhoids 11/08/2014   AAA (abdominal aortic aneurysm) (HCC) 09/22/2014   Chronic pain associated with significant psychosocial dysfunction 09/22/2014   Panic disorder 09/22/2014   AB (asthmatic bronchitis) 08/17/2014   Anxiety 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 08/17/2014   Backache 08/17/2014   Lumbosacral spondylosis without myelopathy 08/17/2014   Disorder of male genital organs 08/17/2014   Brash 08/17/2014   Low back pain 08/17/2014   Tendon nodule 08/17/2014   Episodic paroxysmal anxiety disorder 08/17/2014   Hernia, inguinal, right 08/17/2014   Fast heart beat 08/17/2014   Illness 08/17/2014   Inguinal hernia 10/14/2012    ONSET DATE: 11/09/2020  REFERRING DIAG: CVA  THERAPY DIAG:  Muscle weakness (generalized)  Other lack of coordination  H/O ischemic left MCA stroke  Rationale for Evaluation and Treatment Rehabilitation  PERTINENT HISTORY: Pt. is a 50 y.o. who was diagnosed with a CVA on July 21st, 2022. Pt. completed several weeks of  inpatient rehab at [facility]. After returning to home, Pt. sustained a fall in december of 2022, and was admitted to the hospital with COVID-19, and back pain from the fall. Since the most recent discharge, Pt. has been residing with the ex-wife until the Pt. is ready to return to independent living. Pt. PMHx includes: Bilateral LBP with right sided sciatica, Lumbosacral spondylosis myelopathy, closed compression Fx of L1. Pt. enjoys cooking, and riding motorcycles.     PRECAUTIONS: Fall  SUBJECTIVE: Pt arrived late.  Pt thought his appointment started at 930 instead of 915. PAIN:  Are you having pain? Yes: NPRS scale: 3/10 Pain location: low back Pain description: aching Aggravating factors: prolonged sitting or lying down Relieving factors: rest, heat, walking  [facility] OT Assessment        12/11/21 01/22/22            Observation/Other Assessments     Focus on Therapeutic Outcomes (FOTO)   46 50             Coordination     Right 9 Hole Peg Test unable  unable    Left 9 Hole Peg Test 30 sec  30 sec            Hand Function     Right Hand Grip (lbs) 35 (3rd setting) 30 lb (3rd);  41 lb (4th setting),     Right Hand Lateral Pinch 9 lbs   slipping 12 lbs (thumb slips)    Right Hand 3 Point Pinch   unable to position thumb to produce 3 pt pinch Unable to position thumb to produce 3 point pinch    Left Hand Grip (lbs) 116      Left Hand Lateral Pinch 29 lbs      Left 3 point pinch 27 lbs        OBJECTIVE:  TODAY'S TREATMENT:  Therapeutic Exercise: Participated in place and hold for thumb radial abd, resisted lateral pinch pushing into foam dowel, active assisted palmar abd x2 sets 7-10 reps each.  Performed active digit abd and active assisted add, working to strengthen intrinsics x3 sets 7-10 each.  Facilitated 3 point pinch, pushing into foam dowel, with OT providing set up and stabilization of digits in 3 point pinch pattern while pt pinched into dowel for 2 sets 10 reps each.  Practiced active opening/closing of fist with pt requiring OT assist to delay thumb to prevent thumb tucking on 50% of trials 3 sets 10 reps.  Pt performed a 3rd set with an outstretched arm/extended elbow, stating that he feels he has more control of his thumb with this arm in this position, and more easily able to open/close hand without tucking thumb.   Neuro re-ed: Facilitated 3 point pinch prehension patterns working to grasp ball pegs from pegboard.  Pt required multiple trials for initial pick up, min vc for modulation of pinch strength to reduce thumb slipping, but pt continues to improve with consistency after multiple repetitions and requiring fewer rest breaks in between each peg.    PATIENT EDUCATION: Education details: HEP progression, grasp/release activities; wrist flex against gravity, lateral and 3 point pinch with foam dowel Person educated: Patient Education method: Explanation Education  comprehension: verbalized and demonstrated understanding.      OT Short Term Goals -      OT SHORT TERM GOAL #1   Title Pt. wil demonstrtae independence with HEPs    Baseline Eval: Pt. currently does not have one, 10th visit:  changes in HEP ongoing; 20th: instructed in putty exercises, changes ongoing; 40th: continual updates to HEP as pt progresses; 50th: continual updates; 01/22/22: continual updates   Time 6    Period Weeks    Status On-going    Target Date 03/05/22             OT Long Term Goals -       OT LONG TERM GOAL #1   Title Pt. will improve RUE  strength by 2 mm grades to assist with ADLs, and IADLs,    Baseline Eval: Right shoulder flexion, abduction 3/5, elbow flexion, extension wrist extension 3+/5, 10th visit: improving with RUE strength by not yet met goal; 20th visit: R shd flex/abd 4-, elbow flex/ext 4+, wrist flex 4-, wrist ext 4+; 40:  R shd flex/abd 4,  elbow flex/ext 4+, wrist flex 4, wrist ext 4+; 50th visit 8/22: R shoulder flex/abd 4/5, elbow flex/ext 5/5, wrist flex 4, wrist ext 4+; 01/22/22: shoulder flex/abd 4/5, elbow flex/ext 5/5, wrist flex 4, wrist ext 4+,    Time 12    Period Weeks    Status On-going    Target Date 04/16/22      OT LONG TERM GOAL #2   Title Pt. will improve right grip by 10 lbs to prepare for firmly holding objects for IADLs.    Baseline Eval: R: 38#, L: 124# (In 3rd dynamometer slot); R 35# in 4th slot, 28# in 3rd slot, 40th: 47 in 4th slot; 50th: 12/11/21: 46# on 3rd slot; 01/22/22: 30# on 3rd slot   Time 12    Period Weeks    Status On-going    Target Date 04/16/22      OT LONG TERM GOAL #3   Title Pt. will improve right lateral pinch strength by 5# to assist with cutting food    Baseline Eval: R: 17, L: 25; 20th: R: 18; 40th: 15# fingers slipping on meter, 50th: 9# fingers slipping; 01/22/22: 12# fingers slipping   Time 12    Period Weeks    Status On-going    Target Date 04/16/22      OT LONG TERM GOAL #4   Title Pt.  will improve Right 3pt pinch by 2# to be able to hold/open items for cooking    Baseline Eval: R: Pt. unable to engage thumb L: 29#; 20th: R unable; 40th:  unable, fingers slipping; 50th:  unable, fingers slipping; 01/22/22: fingers slipping but pt is now able to grasp a ball peg with 3 point pinch pattern   Time 12    Period Weeks    Status On-going    Target Date 04/16/22      OT LONG TERM GOAL #5   Title Pt. will improve right hand FMC skills to be able to independently manipulate buttons, and zippers.    Baseline Eval: Pt. has difficulty managing buttons,a nd zippers. 10th visit:  improving; 20th: unable to engage R hand effectively, performs 1 handed with L; 50th: performs L handed only; 01/22/22: pt can use R hand as a stabilizer with clothing fasteners with mod vc   Time 12    Period Weeks    Status On-going    Target Date 04/16/22      OT LONG TERM GOAL #6   Title Pt. will independently write his name    Baseline Eval: Pt. is unable to hold a pen; 20th: pt can grip PenAgain with R hand and can make marks on paper with difficulty, but not yet writing name; 40th:  continues to demonstrate difficulty with grasp of pen; 50th 12/11/21: With assist to set up hand on pen again, pt was able to write first name and initial of last name in 1 1/4" tall letters with fair legibility (pt wants to defer this goal, states this is low on his list of priorities for now)   Time 12    Period Weeks    Status Defer   Target Date 04/16/22        OT LONG TERM GOAL #7   Title Pt. will improve FOTO score by 2 points to reflect funational improvement    Baseline Eval: FOTO score 46 with TR score 52; 20th: 45; 40th: 44;  50th: 46; 01/22/22: 50   Time 12    Period Weeks    Status On-going    Target Date 04/16/22             Plan -    Clinical Impression Statement Shortened tx session today as pt arrived late, accidentally getting his appointment start time wrong.  Pt continues to improve with active  opening/closing of fist with less thumb tucking.  Pt feels he controls this thumb better with an outstretched arm, allowing him to avoid thumb tucking on most attempts.  When working with 3 point pinch pattern to pick up ball pegs, pt is improving with consistency to pick up a peg without thumb slipping after multiple repetitions, and is requiring fewer rest breaks in between each peg pick up.  Pt still requires light pressure to pick up a peg to limit thumb slipping.  Pt continues to require OT assist to sustain finger position in a 3 point pinch pattern if working to pinch with more forceful pressure, otherwise fingers slip from position.  Pt states he is unable to practice 3 point pinching at home very well with his foam as he can't keep his fingers in place.  Pt will continue to work towards neuro re-ed of right hand with emphasis on thumb movements to integrate into functional grasping and manipulation skills.    OT Occupational Profile and History Detailed Assessment- Review of Records and additional review of physical, cognitive, psychosocial history related to current functional performance    Occupational performance deficits (Please refer to evaluation for details): ADL's;IADL's;Education    Rehab Potential Good    Clinical Decision Making Several treatment options, min-mod task modification necessary    Comorbidities Affecting Occupational Performance: May have comorbidities impacting occupational performance    Modification or Assistance to Complete Evaluation  Min-Moderate modification of tasks or assist with assess necessary to complete eval    OT Frequency 2x / week    OT Duration 12 weeks    OT Treatment/Interventions Self-care/ADL training;DME and/or AE instruction;Therapeutic exercise;Ultrasound;Neuromuscular education;Therapeutic activities;Energy conservation;Moist Heat;Patient/family education;Splinting;Functional Mobility Training;Paraffin    Plan Neuro re-ed and therapeutic exercise     Consulted and Agree with Plan of Care Patient              , MS, OTR/L    K , OT 02/17/2022, 7:20 PM       

## 2022-02-19 ENCOUNTER — Encounter: Payer: Medicare HMO | Admitting: Speech Pathology

## 2022-02-19 ENCOUNTER — Ambulatory Visit: Payer: Medicare Other

## 2022-02-19 DIAGNOSIS — R278 Other lack of coordination: Secondary | ICD-10-CM

## 2022-02-19 DIAGNOSIS — M6281 Muscle weakness (generalized): Secondary | ICD-10-CM

## 2022-02-19 DIAGNOSIS — Z8673 Personal history of transient ischemic attack (TIA), and cerebral infarction without residual deficits: Secondary | ICD-10-CM

## 2022-02-19 DIAGNOSIS — R2681 Unsteadiness on feet: Secondary | ICD-10-CM

## 2022-02-19 NOTE — Therapy (Signed)
[place] MAIN [facility] SERVICES [address] [place], [place], [address] Phone: [phone]   Fax:  [phone]  Patient Details  Name: David Reeves MRN: [id number] Date of Birth: 08-May-1970 Referring Provider:  [name], PA-C  Encounter Date: 02/19/2022   OUTPATIENT PHYSICAL THERAPY TREATMENT NOTE      Patient Name: David Reeves MRN: [id number] DOB:1971/01/20, 51 y.o., male Today's Date: 02/19/2022  PCP: [name], PA-C REFERRING PROVIDER: [name], PA-C   PT End of Session - 02/19/22 1327     Visit Number 56    Number of Visits 13    Date for PT Re-Evaluation 04/26/22    Authorization Type [employer]    Progress Note Due on Visit 20    PT Start Time 1019    PT Stop Time 1100    PT Time Calculation (min) 41 min    Equipment Utilized During Treatment Gait belt    Activity Tolerance Patient tolerated treatment well;No increased pain    Behavior During Therapy [facility] for tasks assessed/performed                  Past Medical History:  Diagnosis Date   Back pain 04/22/2012   Bone spur    Bulging disc 04/22/2012   Degenerative disc disease    Osteoarthritis    Panic anxiety syndrome    Stroke ([place]) 10/2020   Taking multiple medications for chronic disease    Past Surgical History:  Procedure Laterality Date   CYSTECTOMY     head   HERNIA REPAIR Left 2006,2014   [facility]   TOE SURGERY Right 2007   Patient Active Problem List   Diagnosis Date Noted   Alterations of sensations following cerebrovascular accident 03/08/2021   Aphasia as late effect of cerebrovascular accident (CVA) 03/08/2021   Muscle spasm 02/22/2021   Acute non-recurrent frontal sinusitis 02/22/2021   Chronic pain syndrome 01/23/2021   Primary hypertension 01/23/2021   Nerve pain 01/23/2021   Erectile dysfunction due to diseases classified elsewhere 01/23/2021   Cognitive dysfunction 01/23/2021   Scrotal  edema 01/23/2021   Elevated LDL cholesterol level 01/23/2021   History of stroke with residual deficit 01/23/2021   Primary insomnia 01/23/2021   Mouth pain 01/23/2021   Laceration of right hand without foreign body    Closed compression fracture of L1 vertebra ([place]) 05/24/2016   Lumbar stenosis with neurogenic claudication 11/01/2015   Chronic bilateral low back pain with right-sided sciatica 11/08/2014   Hx of hemorrhoids 11/08/2014   AAA (abdominal aortic aneurysm) ([place]) 09/22/2014   Chronic pain associated with significant psychosocial dysfunction 09/22/2014   Panic disorder 09/22/2014   AB (asthmatic bronchitis) 08/17/2014   Anxiety 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 08/17/2014   Backache 08/17/2014   Lumbosacral spondylosis without myelopathy 08/17/2014   Disorder of male genital organs 08/17/2014   Brash 08/17/2014   Low back pain 08/17/2014   Tendon nodule 08/17/2014   Episodic paroxysmal anxiety disorder 08/17/2014   Hernia, inguinal, right 08/17/2014   Fast heart beat 08/17/2014   Illness 08/17/2014   Inguinal hernia 10/14/2012    REFERRING DIAG: History of stroke with residual deficit, chronic bilateral low back pain with right-sided sciatica, Lumbosacral spondylosis without myelopathy THERAPY DIAG:  Unsteadiness on feet  Muscle weakness (generalized)  Other lack of coordination  Rationale for Evaluation and Treatment Rehabilitation  PERTINENT HISTORY: Pt. is a 51 y.o. who was diagnosed with a CVA on July 21st, 2022. Pt. completed  several weeks of inpatient rehab at [facility]. After returning to home. He was discharged in late August/early September and recieved home health PT. Pt. sustained a fall in december of 2022, and was admitted to the hospital with COVID-19, and Chronic back pain. He reports chronic back pain syndrome since 2012. Since the most recent discharge, Pt. has been residing with the ex-wife until the Pt. is ready to return to independent living. He  did recieve 2 weeks of home health after discharge in December 2022. He is now being referred to outpatient PT to address weakness from stroke and improve fine motor movement. He reports rarely getting numbness/tingling in RLE. PMHx includes: Bilateral LBP with right sided sciatica, Lumbosacral spondylosis myelopathy, closed compression Fx of L1. Has chronic insomnia. osteoarthritis, Panic anxiety syndrome (taking medication) Pt. enjoys cooking, and riding motorcycles. Patient is going to [facility] spine center for Epidural spinal injections on 06/07/21;  PRECAUTIONS: Fall, Spinal Brace wears daily, approximately 25%, when exercising; semi-rigid lumbar brace  SUBJECTIVE: Pt reports his pain level is currently 2/10. Pain was worse this morning but has improved with medication. Would like to improve his balance.    PAIN:  Are you having pain? Yes: NPRS scale: 2/10 Pain location: bilat low back Pain description: dull ache; always constant,  Aggravating factors: worse with sitting,  Relieving factors: heat helps temporarily, pain pills/pain patch   TODAY'S TREATMENT 02/19/22  Gait belt donned and CGA provided throughout unless specified otherwise  NMR- At support surface Firm surface- NBOS EO 30 sec NBOS EC 30 sec Airex- NBOS EO 30 sec NBOS EC 4x30 sec - requires UE support, and up to mod assist  Standing on half-foam (round side up) 60 sec Standing on half-foam (round side up) in DF static x 60 sec, then with vertical and horizontal head turns 10x for each Standing on half-foam (round side up) in PF static x 60 sec, then with vertical and horizontal head turns 10x for each Reports LE fatigue.   Alternating march on airex pad for SLB progression 2x20 alt LE. Improves eccentric control with reps  SLB 2x30 sec each LE. Able to sustain at least 10-15 sec each LE   FWD/BCKWD stepping over half-foam x multiple reps, cuing for large-amplitude technique. Pt with difficulty clearing RLE over  obstacle.    TherEx- Leg press: BLE 70# 1x10 100# 1x10 115# 1x10 145# 1x6 160# 1x6  No pain with interventions.  PATIENT EDUCATION:  Education details: Pt educated throughout session about proper posture and technique with exercises. Improved exercise technique, movement at target joints, use of target muscles after min to mod verbal, visual, tactile cues. Goals, plan.  Person educated: Patient Education method: Explanation, Demonstration, Tactile cues, and Verbal cues Education comprehension: verbalized understanding, returned demonstration, verbal cues required, tactile cues required, and needs further education   HOME EXERCISE PROGRAM:  No updates on this date pt to continue HEP as previously given     PT Short Term Goals -       PT SHORT TERM GOAL #1   Title Patient will be adherent to HEP at least 3x a week to improve functional strength and balance for better safety at home.    Baseline 4/4: doing them 1-2x a week; 08/20/2021=Patient reports performing his low back stretching and no questions currently HEP. 10/24/21: doing HEP 4x a week   Time 4    Period Weeks    Status Achieved    Target Date 08/21/21      PT  SHORT TERM GOAL #2   Title Patient (< 90 years old) will complete five times sit to stand test in < 15 seconds indicating an increased LE strength and improved balance.    Baseline 4/4: 17.85 sec with arms across chest; 08/20/2021= 17.6 sec , 7/5: 12.85 sec   Time 4    Period Weeks    Status Achieved;    Target Date 10/01/21              PT Long Term Goals -  TARGET DATE FOR GOALS 04/26/2022      PT LONG TERM GOAL #1   Title Patient will increase six minute walk test distance to >1000 for progression to community ambulator and improve gait ability    Baseline 4/4: 1115 feet , 7/5: 1080 feet;    Time 4    Period Weeks    Status Achieved    Target Date 08/21/21      PT LONG TERM GOAL #2   Title Patient will ascend/descend 8 stairs without rail assist  independently without loss of balance to improve ability to get in/out of home.    Baseline 4/4: requires 1 rail assist; 08/20/2021- Patient demonstrated steps without railing using reciprocal steps today. 7/5: ascend/descend 6 steps reciprocally without rail, but required CGA for safety when descending; 7/25: Able to complete without UE support and good balance going up steps, unsafe descending steps without UE support. Required min assist and UE support to prevent LOB descending.; 8/15: requires UUE support to descend and close CGA due to; unsteadiness; 9/26: ascend without UE support, recip pattern; descend with UUE support, recip stepping, but still doesn't feel steady yet with descending; 10/13 able to demo recip pattern ascending hands free, required instance of UE support with descending, recip pattern due to heel getting caught under step   Time 12*corrected   Period Weeks    Status Partially met   Target Date 02/05/2022      PT LONG TERM GOAL #3   Title Patient will increase BLE gross strength to 4+/5 as to improve functional strength for independent gait, increased standing tolerance and increased ADL ability.    Baseline 4/4: not formally assessed; 08/20/2021= 4+/5 with Right hip flex/knee ext/flex; Right hip abd= 4/5, 7/5: 4+/5 hip    Time 12    Period Weeks    Status Achieved;    Target Date 11/12/21      PT LONG TERM GOAL #4   Title Patient will improve FOTO score to >50% to indicate improved functional mobility with less pain with ADLs.    Baseline 4/4: 35%; 08/20/2021= 42% 7/5: 47%; 7:25:recently addressed; 11/30/21: 35; 9/26: 54%    Time 12    Period Weeks    Status MET   Target Date      PT LONG TERM GOAL #5   Title Patient will report a worst pain of 4/10 in low back over last week to indicate improved tolerance with ADLs.    Baseline 4/4: 7/10; 08/20/2021= 4/10 , 7/5: worst 7/10 in low back; 7/25: 6/10 ; 8/15: pt reports 6/10; 9/26: 4/10   Time 12    Period Weeks    Status  Achieved    Target Date 02/05/2022      Additional Long Term Goals   Additional Long Term Goals Yes      PT LONG TERM GOAL #6   Title Pt will increase 10MWT by at least 0.13 m/s in order to demonstrate clinically significant improvement in  community ambulation    Baseline 08/20/2021=0.94 m/s 7/5: 1.5 m/s   Time 12    Period Weeks    Status Achieved;    Target Date 11/12/21      PT LONG TERM GOAL #7   Title Patient will demonstrate improved static standing balance as seen by ability to single leg stance on right LE > 12 sec consistently for optimal balance on level and unlevel surfaces.    Baseline 08/20/2021= 4 sec SLS on right , 7/5: 4-5 sec inconsistent; 7/25 able to achieve 15 sec after multiple attempts of 4-5 sec holds; 8/15: LLE 15 sec, RLE 8 sec; 9/26: LLE 30 sec, RLE 17 seconds   Time 12    Period Weeks    Status MET   Target Date 02/05/2022    PT LONG TERM GOAL #8  Title Pt will report ability to ambulate for at least 15 minutes without an increase in his LBP in order to improve community participation and ease with ADLs  Baseline 9/26: increase in pain at 15 min mark; 10/13: reports about 20 minutes.   Time 3   Period Weeks   Status MET  Target Date 02/05/2022        PT LONG TERM GOAL #9  Title Pt will report ability to ambulate for at least 45 minutes without an increase in his LBP in order to improve community participation and ease with ADLs  Baseline 9/26: increase in pain at 15 min mark; 10/13: reports about 20 minutes.   Time 3   Period Weeks   Status NEW  Target Date        Plan -     Clinical Impression Statement Session focused primarily on balance as pt has shown improvement in pain and LE strength over last several sessions. Pt most challenged today with sustaining balance with EC on compliant surface, indicating poor use of vestibular cues. He also had difficulty with foot clearance exercise, mainly with clearing R foot. Pt will continue to benefit from  skilled therapy to address remaining deficits in order to improve pain, strength, balance and QOL.    Personal Factors and Comorbidities Comorbidity 3+;Past/Current Experience;Time since onset of injury/illness/exacerbation    Comorbidities PMHx includes: Bilateral LBP with right sided sciatica, Lumbosacral spondylosis myelopathy, closed compression Fx of L1. Has chronic insomnia. osteoarthritis, Panic anxiety syndrome (taking medication)    Examination-Activity Limitations Bend;Carry;Locomotion Level;Sit;Squat;Stairs;Stand;Transfers    Examination-Participation Restrictions Cleaning;Community Activity;Driving;Laundry;Meal Prep;Occupation;Shop;Volunteer;Yard Work    Stability/Clinical Decision Making Stable/Uncomplicated    Rehab Potential Good    PT Frequency 2x / week    PT Duration 12 weeks    PT Treatment/Interventions Cryotherapy;Electrical Stimulation;Moist Heat;Traction;Gait training;Stair training;Functional mobility training;Therapeutic activities;Therapeutic exercise;Balance training;Neuromuscular re-education;Patient/family education;Manual techniques;Passive range of motion;Dry needling;Energy conservation    PT Next Visit Plan  Progress core stabilization, Manual therapy for low back and LE ROM, instruct in balance HEP, continue plan       Consulted and Agree with Plan of Care Patient             Ricard Dillon PT, DPT  Physical Therapist- [facility]  02/19/22, 1:35 PM   [place] [address] [place], [place], [address] Phone: [phone]   Fax:  [phone]

## 2022-02-19 NOTE — Therapy (Signed)
OUTPATIENT OCCUPATIONAL THERAPY NEURO TREATMENT NOTE   Patient Name: David Reeves MRN: [id number] DOB:1970-05-05, 51 y.o., male Today's Date: 02/19/2022  PCP: David Kirschner, PA-C REFERRING PROVIDER: [name], PA-C   OT End of Session - 02/19/22 1446     Visit Number 4    Number of Visits 30    Date for OT Re-Evaluation 04/16/22    Authorization Type Progress reporting period starting 01/22/22    OT Start Time 0930    OT Stop Time 1015    OT Time Calculation (min) 45 min    Activity Tolerance Patient tolerated treatment well    Behavior During Therapy [facility] for tasks assessed/performed               Past Medical History:  Diagnosis Date   Back pain 04/22/2012   Bone spur    Bulging disc 04/22/2012   Degenerative disc disease    Osteoarthritis    Panic anxiety syndrome    Stroke ([place]) 10/2020   Taking multiple medications for chronic disease    Past Surgical History:  Procedure Laterality Date   CYSTECTOMY     head   HERNIA REPAIR Left 2006,2014   [facility]   TOE SURGERY Right 2007   Patient Active Problem List   Diagnosis Date Noted   Alterations of sensations following cerebrovascular accident 03/08/2021   Aphasia as late effect of cerebrovascular accident (CVA) 03/08/2021   Muscle spasm 02/22/2021   Acute non-recurrent frontal sinusitis 02/22/2021   Chronic pain syndrome 01/23/2021   Primary hypertension 01/23/2021   Nerve pain 01/23/2021   Erectile dysfunction due to diseases classified elsewhere 01/23/2021   Cognitive dysfunction 01/23/2021   Scrotal edema 01/23/2021   Elevated LDL cholesterol level 01/23/2021   History of stroke with residual deficit 01/23/2021   Primary insomnia 01/23/2021   Mouth pain 01/23/2021   Laceration of right hand without foreign body    Closed compression fracture of L1 vertebra ([place]) 05/24/2016   Lumbar stenosis with neurogenic claudication 11/01/2015   Chronic bilateral low back pain with right-sided  sciatica 11/08/2014   Hx of hemorrhoids 11/08/2014   AAA (abdominal aortic aneurysm) ([place]) 09/22/2014   Chronic pain associated with significant psychosocial dysfunction 09/22/2014   Panic disorder 09/22/2014   AB (asthmatic bronchitis) 08/17/2014   Anxiety 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 08/17/2014   Backache 08/17/2014   Lumbosacral spondylosis without myelopathy 08/17/2014   Disorder of male genital organs 08/17/2014   Brash 08/17/2014   Low back pain 08/17/2014   Tendon nodule 08/17/2014   Episodic paroxysmal anxiety disorder 08/17/2014   Hernia, inguinal, right 08/17/2014   Fast heart beat 08/17/2014   Illness 08/17/2014   Inguinal hernia 10/14/2012    ONSET DATE: 11/09/2020  REFERRING DIAG: CVA  THERAPY DIAG:  Muscle weakness (generalized)  Other lack of coordination  H/O ischemic left MCA stroke  Rationale for Evaluation and Treatment Rehabilitation  PERTINENT HISTORY: Pt. is a 51 y.o. who was diagnosed with a CVA on July 21st, 2022. Pt. completed several weeks of  inpatient rehab at [facility]. After returning to home, Pt. sustained a fall in december of 2022, and was admitted to the hospital with COVID-19, and back pain from the fall. Since the most recent discharge, Pt. has been residing with the ex-wife until the Pt. is ready to return to independent living. Pt. PMHx includes: Bilateral LBP with right sided sciatica, Lumbosacral spondylosis myelopathy, closed compression Fx of L1. Pt. enjoys cooking, and riding motorcycles.  PRECAUTIONS: Fall  SUBJECTIVE: Pt reports he will see his pain doctor tomorrow.   PAIN:  Are you having pain? Yes: NPRS scale: 2/10 Pain location: low back Pain description: aching Aggravating factors: prolonged sitting or lying down Relieving factors: rest, heat, walking  [facility] OT Assessment        12/11/21 01/22/22            Observation/Other Assessments     Focus on Therapeutic Outcomes (FOTO)   46 50            Coordination      Right 9 Hole Peg Test unable  unable    Left 9 Hole Peg Test 30 sec  30 sec            Hand Function     Right Hand Grip (lbs) 35 (3rd setting) 30 lb (3rd);  41 lb (4th setting),     Right Hand Lateral Pinch 9 lbs   slipping 12 lbs (thumb slips)    Right Hand 3 Point Pinch   unable to position thumb to produce 3 pt pinch Unable to position thumb to produce 3 point pinch    Left Hand Grip (lbs) 116      Left Hand Lateral Pinch 29 lbs      Left 3 point pinch 27 lbs        OBJECTIVE:  TODAY'S TREATMENT:  Therapeutic Exercise: Participated in place and hold for thumb radial abd, resisted lateral pinch pushing into foam dowel, active assisted palmar abd x2 sets 7-10 reps each.  Performed active digit abd and active assisted add, working to strengthen intrinsics x3 sets 7-10 each.  Facilitated 3 point pinch, pushing into foam dowel, with OT providing set up and stabilization of digits in 3 point pinch pattern while pt pinched into dowel for 3 sets 10 reps each.  Practiced active opening/closing of fist with pt requiring OT assist to delay thumb to prevent thumb tucking on 25% of trials 3 sets 10 reps.    Neuro re-ed: Facilitated 3 point pinch prehension patterns working to grasp ball pegs from pegboard.  Pt required multiple trials for initial pick up to reduce thumb slipping, but pt continues to improve with consistency after multiple repetitions and requiring fewer rest breaks in between each peg.    PATIENT EDUCATION: Education details: HEP progression, grasp/release activities; wrist flex against gravity, lateral and 3 point pinch with foam dowel Person educated: Patient Education method: Explanation Education comprehension: verbalized and demonstrated understanding.      OT Short Term Goals -      OT SHORT TERM GOAL #1   Title Pt. wil demonstrtae independence with HEPs    Baseline Eval: Pt. currently does not have one, 10th visit:  changes in HEP ongoing; 20th: instructed in putty  exercises, changes ongoing; 40th: continual updates to HEP as pt progresses; 50th: continual updates; 01/22/22: continual updates   Time 6    Period Weeks    Status On-going    Target Date 03/05/22             OT Long Term Goals -       OT LONG TERM GOAL #1   Title Pt. will improve RUE strength by 2 mm grades to assist with ADLs, and IADLs,    Baseline Eval: Right shoulder flexion, abduction 3/5, elbow flexion, extension wrist extension 3+/5, 10th visit: improving with RUE strength by not yet met goal; 20th visit: R shd flex/abd 4-, elbow flex/ext 4+,  wrist flex 4-, wrist ext 4+; 40:  R shd flex/abd 4,  elbow flex/ext 4+, wrist flex 4, wrist ext 4+; 50th visit 8/22: R shoulder flex/abd 4/5, elbow flex/ext 5/5, wrist flex 4, wrist ext 4+; 01/22/22: shoulder flex/abd 4/5, elbow flex/ext 5/5, wrist flex 4, wrist ext 4+,    Time 12    Period Weeks    Status On-going    Target Date 04/16/22      OT LONG TERM GOAL #2   Title Pt. will improve right grip by 10 lbs to prepare for firmly holding objects for IADLs.    Baseline Eval: R: 38#, L: 124# (In 3rd dynamometer slot); R 35# in 4th slot, 28# in 3rd slot, 40th: 47 in 4th slot; 50th: 12/11/21: 46# on 3rd slot; 01/22/22: 30# on 3rd slot   Time 12    Period Weeks    Status On-going    Target Date 04/16/22      OT LONG TERM GOAL #3   Title Pt. will improve right lateral pinch strength by 5# to assist with cutting food    Baseline Eval: R: 17, L: 25; 20th: R: 18; 40th: 15# fingers slipping on meter, 50th: 9# fingers slipping; 01/22/22: 12# fingers slipping   Time 12    Period Weeks    Status On-going    Target Date 04/16/22      OT LONG TERM GOAL #4   Title Pt. will improve Right 3pt pinch by 2# to be able to hold/open items for cooking    Baseline Eval: R: Pt. unable to engage thumb L: 29#; 20th: R unable; 40th:  unable, fingers slipping; 50th:  unable, fingers slipping; 01/22/22: fingers slipping but pt is now able to grasp a ball peg with 3  point pinch pattern   Time 12    Period Weeks    Status On-going    Target Date 04/16/22      OT LONG TERM GOAL #5   Title Pt. will improve right hand [employer] skills to be able to independently manipulate buttons, and zippers.    Baseline Eval: Pt. has difficulty managing buttons,a nd zippers. 10th visit:  improving; 20th: unable to engage R hand effectively, performs 1 handed with L; 50th: performs L handed only; 01/22/22: pt can use R hand as a stabilizer with clothing fasteners with mod vc   Time 12    Period Weeks    Status On-going    Target Date 04/16/22      OT LONG TERM GOAL #6   Title Pt. will independently write his name    Baseline Eval: Pt. is unable to hold a pen; 20th: pt can grip PenAgain with R hand and can make marks on paper with difficulty, but not yet writing name; 40th:  continues to demonstrate difficulty with grasp of pen; 50th 12/11/21: With assist to set up hand on pen again, pt was able to write first name and initial of last name in 1 1/4" tall letters with fair legibility (pt wants to defer this goal, states this is low on his list of priorities for now)   Time 12    Period Weeks    Status Defer   Target Date 04/16/22      OT LONG TERM GOAL #7   Title Pt. will improve FOTO score by 2 points to reflect funational improvement    Baseline Eval: FOTO score 46 with TR score 52; 20th: 45; 40th: 44;  50th: 46; 01/22/22: 50  Time 12    Period Weeks    Status On-going    Target Date 04/16/22             Plan -    Clinical Impression Statement Pt reports he's looking forward to a follow up with his pain doctor tomorrow.  Pt reports that he's using his R hand more at home to open more doors, and stated that he can now use his R hand to open the storm door, which requires a push from this thumb while the other digits are wrapped around the handle.  When working with 3 point pinch pattern to pick up ball pegs, pt continues to improve with consistency to pick up a peg  without thumb slipping after multiple repetitions, and is requiring fewer rest breaks in between each peg pick up.  Pt still requires light pressure to pick up a peg to limit thumb slipping.  Pt continues to require OT assist to sustain finger position in a 3 point pinch pattern if working to pinch with more forceful pressure, otherwise fingers slip from position.  Pt will continue to work towards neuro re-ed of right hand with emphasis on thumb movements to integrate into functional grasping and manipulation skills.    OT Occupational Profile and History Detailed Assessment- Review of Records and additional review of physical, cognitive, psychosocial history related to current functional performance    Occupational performance deficits (Please refer to evaluation for details): ADL's;IADL's;Education    Rehab Potential Good    Clinical Decision Making Several treatment options, min-mod task modification necessary    Comorbidities Affecting Occupational Performance: May have comorbidities impacting occupational performance    Modification or Assistance to Complete Evaluation  Min-Moderate modification of tasks or assist with assess necessary to complete eval    OT Frequency 2x / week    OT Duration 12 weeks    OT Treatment/Interventions Self-care/ADL training;DME and/or AE instruction;Therapeutic exercise;Ultrasound;Neuromuscular education;Therapeutic activities;Energy conservation;Moist Heat;Patient/family education;Splinting;Functional Mobility Training;Paraffin    Plan Neuro re-ed and therapeutic exercise    Consulted and Agree with Plan of Care Patient             Leta Speller, MS, OTR/L   Darleene Cleaver, OT 02/19/2022, 2:47 PM

## 2022-02-22 ENCOUNTER — Encounter: Payer: Medicare HMO | Admitting: Speech Pathology

## 2022-02-22 ENCOUNTER — Ambulatory Visit: Payer: Medicare Other | Attending: Physician Assistant

## 2022-02-22 ENCOUNTER — Ambulatory Visit: Payer: Medicare Other

## 2022-02-22 DIAGNOSIS — R2681 Unsteadiness on feet: Secondary | ICD-10-CM | POA: Diagnosis present

## 2022-02-22 DIAGNOSIS — R269 Unspecified abnormalities of gait and mobility: Secondary | ICD-10-CM | POA: Insufficient documentation

## 2022-02-22 DIAGNOSIS — Z8673 Personal history of transient ischemic attack (TIA), and cerebral infarction without residual deficits: Secondary | ICD-10-CM | POA: Insufficient documentation

## 2022-02-22 DIAGNOSIS — M545 Low back pain, unspecified: Secondary | ICD-10-CM | POA: Diagnosis present

## 2022-02-22 DIAGNOSIS — R262 Difficulty in walking, not elsewhere classified: Secondary | ICD-10-CM

## 2022-02-22 DIAGNOSIS — G8929 Other chronic pain: Secondary | ICD-10-CM | POA: Diagnosis present

## 2022-02-22 DIAGNOSIS — M6281 Muscle weakness (generalized): Secondary | ICD-10-CM

## 2022-02-22 DIAGNOSIS — R278 Other lack of coordination: Secondary | ICD-10-CM | POA: Insufficient documentation

## 2022-02-22 NOTE — Therapy (Signed)
[place] [facility] [employer] SERVICES [address] [place], [place], [address] Phone: [phone]   Fax:  [phone]  Patient Details  Name: David Reeves MRN: [id number] Date of Birth: 04-14-1971 Referring Provider:  [name], PA-C  Encounter Date: 02/22/2022   OUTPATIENT PHYSICAL THERAPY TREATMENT NOTE      Patient Name: David Reeves MRN: [id number] DOB:01/18/1971, 51 y.o., male Today's Date: 02/22/2022  PCP: [name], PA-C REFERRING PROVIDER: [name], PA-C   PT End of Session - 02/22/22 1017     Visit Number 53    Number of Visits 72    Date for PT Re-Evaluation 04/26/22    Authorization Type [employer]    Progress Note Due on Visit 42    PT Start Time 1015    PT Stop Time 1100    PT Time Calculation (min) 45 min    Equipment Utilized During Treatment Gait belt    Activity Tolerance Patient tolerated treatment well;No increased pain    Behavior During Therapy [facility] for tasks assessed/performed              Past Medical History:  Diagnosis Date   Back pain 04/22/2012   Bone spur    Bulging disc 04/22/2012   Degenerative disc disease    Osteoarthritis    Panic anxiety syndrome    Stroke ([place]) 10/2020   Taking multiple medications for chronic disease    Past Surgical History:  Procedure Laterality Date   CYSTECTOMY     head   HERNIA REPAIR Left 2006,2014   [facility]   TOE SURGERY Right 2007   Patient Active Problem List   Diagnosis Date Noted   Alterations of sensations following cerebrovascular accident 03/08/2021   Aphasia as late effect of cerebrovascular accident (CVA) 03/08/2021   Muscle spasm 02/22/2021   Acute non-recurrent frontal sinusitis 02/22/2021   Chronic pain syndrome 01/23/2021   Primary hypertension 01/23/2021   Nerve pain 01/23/2021   Erectile dysfunction due to diseases classified elsewhere 01/23/2021   Cognitive dysfunction 01/23/2021   Scrotal edema  01/23/2021   Elevated LDL cholesterol level 01/23/2021   History of stroke with residual deficit 01/23/2021   Primary insomnia 01/23/2021   Mouth pain 01/23/2021   Laceration of right hand without foreign body    Closed compression fracture of L1 vertebra ([place]) 05/24/2016   Lumbar stenosis with neurogenic claudication 11/01/2015   Chronic bilateral low back pain with right-sided sciatica 11/08/2014   Hx of hemorrhoids 11/08/2014   AAA (abdominal aortic aneurysm) ([place]) 09/22/2014   Chronic pain associated with significant psychosocial dysfunction 09/22/2014   Panic disorder 09/22/2014   AB (asthmatic bronchitis) 08/17/2014   Anxiety 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 08/17/2014   Backache 08/17/2014   Lumbosacral spondylosis without myelopathy 08/17/2014   Disorder of male genital organs 08/17/2014   Brash 08/17/2014   Low back pain 08/17/2014   Tendon nodule 08/17/2014   Episodic paroxysmal anxiety disorder 08/17/2014   Hernia, inguinal, right 08/17/2014   Fast heart beat 08/17/2014   Illness 08/17/2014   Inguinal hernia 10/14/2012    REFERRING DIAG: History of stroke with residual deficit, chronic bilateral low back pain with right-sided sciatica, Lumbosacral spondylosis without myelopathy THERAPY DIAG:  Difficulty in walking, not elsewhere classified  Muscle weakness (generalized)  Unsteadiness on feet  Rationale for Evaluation and Treatment Rehabilitation  PERTINENT HISTORY: Pt. is a 51 y.o. who was diagnosed with a CVA on July 21st, 2022. Pt. completed several weeks  of inpatient rehab at [facility]. After returning to home. He was discharged in late August/early September and recieved home health PT. Pt. sustained a fall in december of 2022, and was admitted to the hospital with COVID-19, and Chronic back pain. He reports chronic back pain syndrome since 2012. Since the most recent discharge, Pt. has been residing with the ex-wife until the Pt. is ready to return to  independent living. He did recieve 2 weeks of home health after discharge in December 2022. He is now being referred to outpatient PT to address weakness from stroke and improve fine motor movement. He reports rarely getting numbness/tingling in RLE. PMHx includes: Bilateral LBP with right sided sciatica, Lumbosacral spondylosis myelopathy, closed compression Fx of L1. Has chronic insomnia. osteoarthritis, Panic anxiety syndrome (taking medication) Pt. enjoys cooking, and riding motorcycles. Patient is going to [facility] spine center for Epidural spinal injections on 06/07/21;  PRECAUTIONS: Fall, Spinal Brace wears daily, approximately 25%, when exercising; semi-rigid lumbar brace  SUBJECTIVE: Pt reports his pain level is decreased after getting the increased pain medication and patch.  He reports 1/10 pain upon arrival today.    PAIN:  Are you having pain? Yes: NPRS scale: 1/10 Pain location: bilat low back Pain description: dull ache; always constant,  Aggravating factors: worse with sitting,  Relieving factors: heat helps temporarily, pain pills/pain patch   TODAY'S TREATMENT 02/22/22  Gait belt donned and CGA provided throughout unless specified otherwise  Manual:  Prone unilateral/PA's, Grades II-III for pain modulation and increased mobilities of the thoracic spine, T1-T8 30 sec bouts at each segment, multiple bouts Prone STM with TP release technique applied to the thoracic paraspinals, specific focus placed the R side (painful), but performed on both sides.  MHP applied to contralateral side while therapist performed STM on the treatment side   TherEx:  Leg press: BLE 70# 1x10 100# 1x10 115# 1x10 145# 1x6 160# 1x6  Matrix Cable Column: Seated LAQ/knee extension with ankle belt donned, 17.5# on R LE, 22.5# on the L LE, 2x15 each LE  No pain with interventions.  PATIENT EDUCATION:  Education details: Pt educated throughout session about proper posture and technique with  exercises. Improved exercise technique, movement at target joints, use of target muscles after min to mod verbal, visual, tactile cues. Goals, plan.  Person educated: Patient Education method: Explanation, Demonstration, Tactile cues, and Verbal cues Education comprehension: verbalized understanding, returned demonstration, verbal cues required, tactile cues required, and needs further education   HOME EXERCISE PROGRAM:  No updates on this date pt to continue HEP as previously given     PT Short Term Goals -       PT SHORT TERM GOAL #1   Title Patient will be adherent to HEP at least 3x a week to improve functional strength and balance for better safety at home.    Baseline 4/4: doing them 1-2x a week; 08/20/2021=Patient reports performing his low back stretching and no questions currently HEP. 10/24/21: doing HEP 4x a week   Time 4    Period Weeks    Status Achieved    Target Date 08/21/21      PT SHORT TERM GOAL #2   Title Patient (< 64 years old) will complete five times sit to stand test in < 15 seconds indicating an increased LE strength and improved balance.    Baseline 4/4: 17.85 sec with arms across chest; 08/20/2021= 17.6 sec , 7/5: 12.85 sec   Time 4    Period Weeks  Status Achieved;    Target Date 10/01/21              PT Long Term Goals -  TARGET DATE FOR GOALS 04/26/2022      PT LONG TERM GOAL #1   Title Patient will increase six minute walk test distance to >1000 for progression to community ambulator and improve gait ability    Baseline 4/4: 1115 feet , 7/5: 1080 feet;    Time 4    Period Weeks    Status Achieved    Target Date 08/21/21      PT LONG TERM GOAL #2   Title Patient will ascend/descend 8 stairs without rail assist independently without loss of balance to improve ability to get in/out of home.    Baseline 4/4: requires 1 rail assist; 08/20/2021- Patient demonstrated steps without railing using reciprocal steps today. 7/5: ascend/descend 6 steps  reciprocally without rail, but required CGA for safety when descending; 7/25: Able to complete without UE support and good balance going up steps, unsafe descending steps without UE support. Required min assist and UE support to prevent LOB descending.; 8/15: requires UUE support to descend and close CGA due to; unsteadiness; 9/26: ascend without UE support, recip pattern; descend with UUE support, recip stepping, but still doesn't feel steady yet with descending; 10/13 able to demo recip pattern ascending hands free, required instance of UE support with descending, recip pattern due to heel getting caught under step   Time 12*corrected   Period Weeks    Status Partially met   Target Date 02/05/2022      PT LONG TERM GOAL #3   Title Patient will increase BLE gross strength to 4+/5 as to improve functional strength for independent gait, increased standing tolerance and increased ADL ability.    Baseline 4/4: not formally assessed; 08/20/2021= 4+/5 with Right hip flex/knee ext/flex; Right hip abd= 4/5, 7/5: 4+/5 hip    Time 12    Period Weeks    Status Achieved;    Target Date 11/12/21      PT LONG TERM GOAL #4   Title Patient will improve FOTO score to >50% to indicate improved functional mobility with less pain with ADLs.    Baseline 4/4: 35%; 08/20/2021= 42% 7/5: 47%; 7:25:recently addressed; 11/30/21: 35; 9/26: 54%    Time 12    Period Weeks    Status MET   Target Date      PT LONG TERM GOAL #5   Title Patient will report a worst pain of 4/10 in low back over last week to indicate improved tolerance with ADLs.    Baseline 4/4: 7/10; 08/20/2021= 4/10 , 7/5: worst 7/10 in low back; 7/25: 6/10 ; 8/15: pt reports 6/10; 9/26: 4/10   Time 12    Period Weeks    Status Achieved    Target Date 02/05/2022      Additional Long Term Goals   Additional Long Term Goals Yes      PT LONG TERM GOAL #6   Title Pt will increase 10MWT by at least 0.13 m/s in order to demonstrate clinically significant  improvement in community ambulation    Baseline 08/20/2021=0.94 m/s 7/5: 1.5 m/s   Time 12    Period Weeks    Status Achieved;    Target Date 11/12/21      PT LONG TERM GOAL #7   Title Patient will demonstrate improved static standing balance as seen by ability to single leg stance on right LE >  12 sec consistently for optimal balance on level and unlevel surfaces.    Baseline 08/20/2021= 4 sec SLS on right , 7/5: 4-5 sec inconsistent; 7/25 able to achieve 15 sec after multiple attempts of 4-5 sec holds; 8/15: LLE 15 sec, RLE 8 sec; 9/26: LLE 30 sec, RLE 17 seconds   Time 12    Period Weeks    Status MET   Target Date 02/05/2022    PT LONG TERM GOAL #8  Title Pt will report ability to ambulate for at least 15 minutes without an increase in his LBP in order to improve community participation and ease with ADLs  Baseline 9/26: increase in pain at 15 min mark; 10/13: reports about 20 minutes.   Time 3   Period Weeks   Status MET  Target Date 02/05/2022        PT LONG TERM GOAL #9  Title Pt will report ability to ambulate for at least 45 minutes without an increase in his LBP in order to improve community participation and ease with ADLs  Baseline 9/26: increase in pain at 15 min mark; 10/13: reports about 20 minutes.   Time 3   Period Weeks   Status NEW  Target Date        Plan -     Clinical Impression Statement Pt responded well to the manual therapy, noting a reduction in the pain he was experiencing in the thoracic region around the R scapular area.  Pt elected to continue to perform exercises that targeted the strengthening portion of the affected side.  Pt continues to demonstrate increased strength with each exercise session.   Pt will continue to benefit from skilled therapy to address remaining deficits in order to improve overall QoL and return to PLOF.       Personal Factors and Comorbidities Comorbidity 3+;Past/Current Experience;Time since onset of  injury/illness/exacerbation    Comorbidities PMHx includes: Bilateral LBP with right sided sciatica, Lumbosacral spondylosis myelopathy, closed compression Fx of L1. Has chronic insomnia. osteoarthritis, Panic anxiety syndrome (taking medication)    Examination-Activity Limitations Bend;Carry;Locomotion Level;Sit;Squat;Stairs;Stand;Transfers    Examination-Participation Restrictions Cleaning;Community Activity;Driving;Laundry;Meal Prep;Occupation;Shop;Volunteer;Yard Work    Stability/Clinical Decision Making Stable/Uncomplicated    Rehab Potential Good    PT Frequency 2x / week    PT Duration 12 weeks    PT Treatment/Interventions Cryotherapy;Electrical Stimulation;Moist Heat;Traction;Gait training;Stair training;Functional mobility training;Therapeutic activities;Therapeutic exercise;Balance training;Neuromuscular re-education;Patient/family education;Manual techniques;Passive range of motion;Dry needling;Energy conservation    PT Next Visit Plan  Progress core stabilization, Manual therapy for low back and LE ROM, instruct in balance HEP, continue plan       Consulted and Agree with Plan of Care Patient              Gwenlyn Saran, PT, DPT Physical Therapist- [facility]  02/22/22, 12:27 PM  [place] MAIN [place] [address] [place], [place], [address] Phone: [phone]   Fax:  [phone]

## 2022-02-23 NOTE — Therapy (Signed)
OUTPATIENT OCCUPATIONAL THERAPY NEURO TREATMENT NOTE   Patient Name: David Reeves MRN: [id number] DOB:1970-06-10, 51 y.o., male Today's Date: 02/23/2022  PCP: [name], PA-C REFERRING PROVIDER: [name], PA-C   OT End of Session - 02/23/22 1923     Visit Number [id number]    Number of Visits 59    Date for OT Re-Evaluation 04/16/22    Authorization Type Progress reporting period starting 01/22/22    OT Start Time 0930    OT Stop Time 1015    OT Time Calculation (min) 45 min    Activity Tolerance Patient tolerated treatment well    Behavior During Therapy [facility] for tasks assessed/performed               Past Medical History:  Diagnosis Date   Back pain 04/22/2012   Bone spur    Bulging disc 04/22/2012   Degenerative disc disease    Osteoarthritis    Panic anxiety syndrome    Stroke ([place]) 10/2020   Taking multiple medications for chronic disease    Past Surgical History:  Procedure Laterality Date   CYSTECTOMY     head   HERNIA REPAIR Left 2006,2014   [facility]   TOE SURGERY Right 2007   Patient Active Problem List   Diagnosis Date Noted   Alterations of sensations following cerebrovascular accident 03/08/2021   Aphasia as late effect of cerebrovascular accident (CVA) 03/08/2021   Muscle spasm 02/22/2021   Acute non-recurrent frontal sinusitis 02/22/2021   Chronic pain syndrome 01/23/2021   Primary hypertension 01/23/2021   Nerve pain 01/23/2021   Erectile dysfunction due to diseases classified elsewhere 01/23/2021   Cognitive dysfunction 01/23/2021   Scrotal edema 01/23/2021   Elevated LDL cholesterol level 01/23/2021   History of stroke with residual deficit 01/23/2021   Primary insomnia 01/23/2021   Mouth pain 01/23/2021   Laceration of right hand without foreign body    Closed compression fracture of L1 vertebra ([place]) 05/24/2016   Lumbar stenosis with neurogenic claudication 11/01/2015   Chronic bilateral low back pain with right-sided sciatica  11/08/2014   Hx of hemorrhoids 11/08/2014   AAA (abdominal aortic aneurysm) ([place]) 09/22/2014   Chronic pain associated with significant psychosocial dysfunction 09/22/2014   Panic disorder 09/22/2014   AB (asthmatic bronchitis) 08/17/2014   Anxiety 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 08/17/2014   Backache 08/17/2014   Lumbosacral spondylosis without myelopathy 08/17/2014   Disorder of male genital organs 08/17/2014   Brash 08/17/2014   Low back pain 08/17/2014   Tendon nodule 08/17/2014   Episodic paroxysmal anxiety disorder 08/17/2014   Hernia, inguinal, right 08/17/2014   Fast heart beat 08/17/2014   Illness 08/17/2014   Inguinal hernia 10/14/2012    ONSET DATE: 11/09/2020  REFERRING DIAG: CVA  THERAPY DIAG:  Muscle weakness (generalized)  Other lack of coordination  H/O ischemic left MCA stroke  Rationale for Evaluation and Treatment Rehabilitation  PERTINENT HISTORY: Pt. is a 51 y.o. who was diagnosed with a CVA on July 21st, 2022. Pt. completed several weeks of  inpatient rehab at [facility]. After returning to home, Pt. sustained a fall in december of 2022, and was admitted to the hospital with COVID-19, and back pain from the fall. Since the most recent discharge, Pt. has been residing with the ex-wife until the Pt. is ready to return to independent living. Pt. PMHx includes: Bilateral LBP with right sided sciatica, Lumbosacral spondylosis myelopathy, closed compression Fx of L1. Pt. enjoys cooking, and riding motorcycles.  PRECAUTIONS: Fall  SUBJECTIVE: Pt reports he saw the pain doctor this week and is feeling better. PAIN:  Are you having pain? Yes: NPRS scale: 1/10 Pain location: low back Pain description: aching Aggravating factors: prolonged sitting or lying down Relieving factors: rest, heat, walking  [facility] OT Assessment        12/11/21 01/22/22            Observation/Other Assessments     Focus on Therapeutic Outcomes (FOTO)   46 50             Coordination     Right 9 Hole Peg Test unable  unable    Left 9 Hole Peg Test 30 sec  30 sec            Hand Function     Right Hand Grip (lbs) 35 (3rd setting) 30 lb (3rd);  41 lb (4th setting),     Right Hand Lateral Pinch 9 lbs   slipping 12 lbs (thumb slips)    Right Hand 3 Point Pinch   unable to position thumb to produce 3 pt pinch Unable to position thumb to produce 3 point pinch    Left Hand Grip (lbs) 116      Left Hand Lateral Pinch 29 lbs      Left 3 point pinch 27 lbs        OBJECTIVE:  TODAY'S TREATMENT:  Therapeutic Exercise: Participated in place and hold for thumb radial abd, resisted lateral pinch pushing into foam dowel, active assisted palmar abd x3 sets 7-10 reps each.  Performed active digit abd and active assisted add, working to strengthen intrinsics x3 sets 7-10 each.  Facilitated 3 point pinch, pushing into foam dowel, with OT providing set up and stabilization of digits in 3 point pinch pattern while pt pinched into dowel for 3 sets 10 reps each.  Practiced active opening/closing of fist with pt requiring OT assist to delay thumb to prevent thumb tucking on 25% of trials 3 sets 10 reps.  Pt requires rest breaks between each set for above noted exercises.    Neuro re-ed: Facilitated 3 point pinch prehension patterns working to grasp ball pegs from pegboard, removing them from board and placing pegs on a towel beside the pegboard.  Min vc to practice pinch modulation, working to grasp pegs slightly tighter without dropping.  Pt worked on placing a peg into his R hand fingers using L hand, then using R hand to attempt to place peg back into board, but pt struggled to modulate his pinch and peg would slip from his hand when trying to place peg.  Pinch was either too light causing peg to slip out of hand, or too hard causing thumb to slip which caused pt to drop peg from fingers.   PATIENT EDUCATION: Education details: HEP progression, grasp/release activities; wrist flex  against gravity, lateral and 3 point pinch with foam dowel Person educated: Patient Education method: Explanation Education comprehension: verbalized and demonstrated understanding.      OT Short Term Goals -      OT SHORT TERM GOAL #1   Title Pt. wil demonstrtae independence with HEPs    Baseline Eval: Pt. currently does not have one, 10th visit:  changes in HEP ongoing; 20th: instructed in putty exercises, changes ongoing; 40th: continual updates to HEP as pt progresses; 50th: continual updates; 01/22/22: continual updates   Time 6    Period Weeks    Status On-going    Target Date  03/05/22             OT Long Term Goals -       OT LONG TERM GOAL #1   Title Pt. will improve RUE strength by 2 mm grades to assist with ADLs, and IADLs,    Baseline Eval: Right shoulder flexion, abduction 3/5, elbow flexion, extension wrist extension 3+/5, 10th visit: improving with RUE strength by not yet met goal; 20th visit: R shd flex/abd 4-, elbow flex/ext 4+, wrist flex 4-, wrist ext 4+; 40:  R shd flex/abd 4,  elbow flex/ext 4+, wrist flex 4, wrist ext 4+; 50th visit 8/22: R shoulder flex/abd 4/5, elbow flex/ext 5/5, wrist flex 4, wrist ext 4+; 01/22/22: shoulder flex/abd 4/5, elbow flex/ext 5/5, wrist flex 4, wrist ext 4+,    Time 12    Period Weeks    Status On-going    Target Date 04/16/22      OT LONG TERM GOAL #2   Title Pt. will improve right grip by 10 lbs to prepare for firmly holding objects for IADLs.    Baseline Eval: R: 38#, L: 124# (In 3rd dynamometer slot); R 35# in 4th slot, 28# in 3rd slot, 40th: 47 in 4th slot; 50th: 12/11/21: 46# on 3rd slot; 01/22/22: 30# on 3rd slot   Time 12    Period Weeks    Status On-going    Target Date 04/16/22      OT LONG TERM GOAL #3   Title Pt. will improve right lateral pinch strength by 5# to assist with cutting food    Baseline Eval: R: 17, L: 25; 20th: R: 18; 40th: 15# fingers slipping on meter, 50th: 9# fingers slipping; 01/22/22: 12# fingers  slipping   Time 12    Period Weeks    Status On-going    Target Date 04/16/22      OT LONG TERM GOAL #4   Title Pt. will improve Right 3pt pinch by 2# to be able to hold/open items for cooking    Baseline Eval: R: Pt. unable to engage thumb L: 29#; 20th: R unable; 40th:  unable, fingers slipping; 50th:  unable, fingers slipping; 01/22/22: fingers slipping but pt is now able to grasp a ball peg with 3 point pinch pattern   Time 12    Period Weeks    Status On-going    Target Date 04/16/22      OT LONG TERM GOAL #5   Title Pt. will improve right hand FMC skills to be able to independently manipulate buttons, and zippers.    Baseline Eval: Pt. has difficulty managing buttons,a nd zippers. 10th visit:  improving; 20th: unable to engage R hand effectively, performs 1 handed with L; 50th: performs L handed only; 01/22/22: pt can use R hand as a stabilizer with clothing fasteners with mod vc   Time 12    Period Weeks    Status On-going    Target Date 04/16/22      OT LONG TERM GOAL #6   Title Pt. will independently write his name    Baseline Eval: Pt. is unable to hold a pen; 20th: pt can grip PenAgain with R hand and can make marks on paper with difficulty, but not yet writing name; 40th:  continues to demonstrate difficulty with grasp of pen; 50th 12/11/21: With assist to set up hand on pen again, pt was able to write first name and initial of last name in 1 1/4" tall letters with fair legibility (pt   wants to defer this goal, states this is low on his list of priorities for now)   Time 12    Period Weeks    Status Defer   Target Date 04/16/22      OT LONG TERM GOAL #7   Title Pt. will improve FOTO score by 2 points to reflect funational improvement    Baseline Eval: FOTO score 46 with TR score 52; 20th: 45; 40th: 46;  50th: 46; 01/22/22: 50   Time 12    Period Weeks    Status On-going    Target Date 04/16/22             Plan -    Clinical Impression Statement Pt reports back is  feeling better after follow up with pain doctor since last session.  Continued focus on 3 point pinch prehension patterns working to grasp ball pegs from pegboard, removing them from board and placing pegs on a towel beside the pegboard.  Pt is increasing amount of pegs removed with fewer rest breaks and fewer dropped pegs.  Pt worked on placing a peg into his R hand fingers using L hand, then using R hand to attempt to place peg back into board, but pt struggled to modulate his pinch and peg would slip from his hand when trying to place peg.  Pinch was either too light causing peg to slip out of hand, or too hard causing thumb to slip which caused pt to drop peg from fingers.  Pt will continue to work towards neuro re-ed of right hand with emphasis on thumb movements to integrate into functional grasping and manipulation skills.    OT Occupational Profile and History Detailed Assessment- Review of Records and additional review of physical, cognitive, psychosocial history related to current functional performance    Occupational performance deficits (Please refer to evaluation for details): ADL's;IADL's;Education    Rehab Potential Good    Clinical Decision Making Several treatment options, min-mod task modification necessary    Comorbidities Affecting Occupational Performance: May have comorbidities impacting occupational performance    Modification or Assistance to Complete Evaluation  Min-Moderate modification of tasks or assist with assess necessary to complete eval    OT Frequency 2x / week    OT Duration 12 weeks    OT Treatment/Interventions Self-care/ADL training;DME and/or AE instruction;Therapeutic exercise;Ultrasound;Neuromuscular education;Therapeutic activities;Energy conservation;Moist Heat;Patient/family education;Splinting;Functional Mobility Training;Paraffin    Plan Neuro re-ed and therapeutic exercise    Consulted and Agree with Plan of Care Patient             Leta Speller,  MS, OTR/L   Darleene Cleaver, OT 02/23/2022, 7:24 PM

## 2022-02-25 ENCOUNTER — Other Ambulatory Visit: Payer: Self-pay | Admitting: Physician Assistant

## 2022-02-25 DIAGNOSIS — G8929 Other chronic pain: Secondary | ICD-10-CM

## 2022-02-26 ENCOUNTER — Ambulatory Visit: Payer: Medicare Other

## 2022-02-26 ENCOUNTER — Encounter: Payer: Medicare HMO | Admitting: Speech Pathology

## 2022-02-26 DIAGNOSIS — R2681 Unsteadiness on feet: Secondary | ICD-10-CM

## 2022-02-26 DIAGNOSIS — R278 Other lack of coordination: Secondary | ICD-10-CM

## 2022-02-26 DIAGNOSIS — R269 Unspecified abnormalities of gait and mobility: Secondary | ICD-10-CM

## 2022-02-26 DIAGNOSIS — M6281 Muscle weakness (generalized): Secondary | ICD-10-CM

## 2022-02-26 DIAGNOSIS — Z8673 Personal history of transient ischemic attack (TIA), and cerebral infarction without residual deficits: Secondary | ICD-10-CM

## 2022-02-26 DIAGNOSIS — G8929 Other chronic pain: Secondary | ICD-10-CM

## 2022-02-26 DIAGNOSIS — R262 Difficulty in walking, not elsewhere classified: Secondary | ICD-10-CM

## 2022-02-26 NOTE — Therapy (Signed)
[place] MAIN [facility] SERVICES [address] [place], [place], [address] Phone: [phone]   Fax:  [phone]  Patient Details  Name: David Reeves MRN: [id number] Date of Birth: 01/30/1971 Referring Provider:  [name], PA-C  Encounter Date: 02/26/2022   OUTPATIENT PHYSICAL THERAPY TREATMENT NOTE      Patient Name: David Reeves MRN: [id number] DOB:1970-12-16, 51 y.o., male Today's Date: 02/26/2022  PCP: [name], PA-C REFERRING PROVIDER: [name], PA-C   PT End of Session - 02/26/22 1020     Visit Number 79    Number of Visits 89    Date for PT Re-Evaluation 04/26/22    Authorization Type [employer]    Authorization Time Period 02/01/22-04/26/22    Progress Note Due on Visit 55    PT Start Time 1015    PT Stop Time 1055    PT Time Calculation (min) 40 min    Activity Tolerance Patient tolerated treatment well;No increased pain    Behavior During Therapy [facility] for tasks assessed/performed              Past Medical History:  Diagnosis Date   Back pain 04/22/2012   Bone spur    Bulging disc 04/22/2012   Degenerative disc disease    Osteoarthritis    Panic anxiety syndrome    Stroke ([place]) 10/2020   Taking multiple medications for chronic disease    Past Surgical History:  Procedure Laterality Date   CYSTECTOMY     head   HERNIA REPAIR Left 2006,2014   [facility]   TOE SURGERY Right 2007   Patient Active Problem List   Diagnosis Date Noted   Alterations of sensations following cerebrovascular accident 03/08/2021   Aphasia as late effect of cerebrovascular accident (CVA) 03/08/2021   Muscle spasm 02/22/2021   Acute non-recurrent frontal sinusitis 02/22/2021   Chronic pain syndrome 01/23/2021   Primary hypertension 01/23/2021   Nerve pain 01/23/2021   Erectile dysfunction due to diseases classified elsewhere 01/23/2021   Cognitive dysfunction 01/23/2021   Scrotal edema 01/23/2021    Elevated LDL cholesterol level 01/23/2021   History of stroke with residual deficit 01/23/2021   Primary insomnia 01/23/2021   Mouth pain 01/23/2021   Laceration of right hand without foreign body    Closed compression fracture of L1 vertebra ([place]) 05/24/2016   Lumbar stenosis with neurogenic claudication 11/01/2015   Chronic bilateral low back pain with right-sided sciatica 11/08/2014   Hx of hemorrhoids 11/08/2014   AAA (abdominal aortic aneurysm) ([place]) 09/22/2014   Chronic pain associated with significant psychosocial dysfunction 09/22/2014   Panic disorder 09/22/2014   AB (asthmatic bronchitis) 08/17/2014   Anxiety 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 08/17/2014   Backache 08/17/2014   Lumbosacral spondylosis without myelopathy 08/17/2014   Disorder of male genital organs 08/17/2014   Brash 08/17/2014   Low back pain 08/17/2014   Tendon nodule 08/17/2014   Episodic paroxysmal anxiety disorder 08/17/2014   Hernia, inguinal, right 08/17/2014   Fast heart beat 08/17/2014   Illness 08/17/2014   Inguinal hernia 10/14/2012    REFERRING DIAG: History of stroke with residual deficit, chronic bilateral low back pain with right-sided sciatica, Lumbosacral spondylosis without myelopathy THERAPY DIAG:  Muscle weakness (generalized)  Other lack of coordination  Difficulty in walking, not elsewhere classified  Unsteadiness on feet  Abnormality of gait and mobility  Chronic bilateral low back pain without sciatica  Rationale for Evaluation and Treatment Rehabilitation  PERTINENT HISTORY: Pt. is  a 51 y.o. who was diagnosed with a CVA on July 21st, 2022. Pt. completed several weeks of inpatient rehab at [facility]. After returning to home. He was discharged in late August/early September and recieved home health PT. Pt. sustained a fall in december of 2022, and was admitted to the hospital with COVID-19, and Chronic back pain. He reports chronic back pain syndrome since 2012. Since the  most recent discharge, Pt. has been residing with the ex-wife until the Pt. is ready to return to independent living. He did recieve 2 weeks of home health after discharge in December 2022. He is now being referred to outpatient PT to address weakness from stroke and improve fine motor movement. He reports rarely getting numbness/tingling in RLE. PMHx includes: Bilateral LBP with right sided sciatica, Lumbosacral spondylosis myelopathy, closed compression Fx of L1. Has chronic insomnia. osteoarthritis, Panic anxiety syndrome (taking medication) Pt. enjoys cooking, and riding motorcycles. Patient is going to [facility] spine center for Epidural spinal injections on 06/07/21;  PRECAUTIONS: Fall, Spinal Brace wears daily, approximately 25%, when exercising; semi-rigid lumbar brace  SUBJECTIVE: Still may go for another trigger point injection.  He reports 1/10 pain upon arrival today. He just got his truck back. No falls or close calls.    PAIN:  Are you having pain? Yes: NPRS scale: 1/10 Pain location: bilat low back Pain description: dull ache; always constant,  Aggravating factors: worse with sitting,  Relieving factors: heat helps temporarily, pain pills/pain patch   TODAY'S TREATMENT 02/26/22 In prone: Central Spine PA mobilization, Grade 3, 1x10sec each from L4-T8; -sustained ischemic release to Right thoracic paraspinals 5x60sec T6-T8 level *improved resting pain thereafter in short sitting -MHP applied to contralateral side while therapist performed STM on the treatment side  Leg press:  BLE 115# 1x10 115# 1x10 115# 1x10   Matrix Cable Column: Seated LAQ/knee extension with ankle belt donned,  17.5# on R LE, 22.5# on the L LE, 2x15 each LE Seated hamstrings flexion: 1x15 bilat 22.5;  17.5# on R LE, 22.5# on the L LE, 2x15 each LE Seated hamstrings flexion: 1x15 bilat 22.5;   No pain with interventions.  PATIENT EDUCATION:  Education details: Pt educated throughout session about  proper posture and technique with exercises. Improved exercise technique, movement at target joints, use of target muscles after min to mod verbal, visual, tactile cues. Goals, plan.  Person educated: Patient Education method: Explanation, Demonstration, Tactile cues, and Verbal cues Education comprehension: verbalized understanding, returned demonstration, verbal cues required, tactile cues required, and needs further education   HOME EXERCISE PROGRAM: No updates on this date pt to continue HEP as previously given     PT Short Term Goals -       PT SHORT TERM GOAL #1   Title Patient will be adherent to HEP at least 3x a week to improve functional strength and balance for better safety at home.    Baseline 4/4: doing them 1-2x a week; 08/20/2021=Patient reports performing his low back stretching and no questions currently HEP. 10/24/21: doing HEP 4x a week   Time 4    Period Weeks    Status Achieved    Target Date 08/21/21      PT SHORT TERM GOAL #2   Title Patient (< 21 years old) will complete five times sit to stand test in < 15 seconds indicating an increased LE strength and improved balance.    Baseline 4/4: 17.85 sec with arms across chest; 08/20/2021= 17.6 sec , 7/5: 12.85  sec   Time 4    Period Weeks    Status Achieved;    Target Date 10/01/21              PT Long Term Goals -  TARGET DATE FOR GOALS 04/26/2022      PT LONG TERM GOAL #1   Title Patient will increase six minute walk test distance to >1000 for progression to community ambulator and improve gait ability    Baseline 4/4: 1115 feet , 7/5: 1080 feet;    Time 4    Period Weeks    Status Achieved    Target Date 08/21/21      PT LONG TERM GOAL #2   Title Patient will ascend/descend 8 stairs without rail assist independently without loss of balance to improve ability to get in/out of home.    Baseline 4/4: requires 1 rail assist; 08/20/2021- Patient demonstrated steps without railing using reciprocal steps today.  7/5: ascend/descend 6 steps reciprocally without rail, but required CGA for safety when descending; 7/25: Able to complete without UE support and good balance going up steps, unsafe descending steps without UE support. Required min assist and UE support to prevent LOB descending.; 8/15: requires UUE support to descend and close CGA due to; unsteadiness; 9/26: ascend without UE support, recip pattern; descend with UUE support, recip stepping, but still doesn't feel steady yet with descending; 10/13 able to demo recip pattern ascending hands free, required instance of UE support with descending, recip pattern due to heel getting caught under step   Time 12*corrected   Period Weeks    Status Partially met   Target Date 02/05/2022      PT LONG TERM GOAL #3   Title Patient will increase BLE gross strength to 4+/5 as to improve functional strength for independent gait, increased standing tolerance and increased ADL ability.    Baseline 4/4: not formally assessed; 08/20/2021= 4+/5 with Right hip flex/knee ext/flex; Right hip abd= 4/5, 7/5: 4+/5 hip    Time 12    Period Weeks    Status Achieved;    Target Date 11/12/21      PT LONG TERM GOAL #4   Title Patient will improve FOTO score to >50% to indicate improved functional mobility with less pain with ADLs.    Baseline 4/4: 35%; 08/20/2021= 42% 7/5: 47%; 7:25:recently addressed; 11/30/21: 35; 9/26: 54%    Time 12    Period Weeks    Status MET   Target Date      PT LONG TERM GOAL #5   Title Patient will report a worst pain of 4/10 in low back over last week to indicate improved tolerance with ADLs.    Baseline 4/4: 7/10; 08/20/2021= 4/10 , 7/5: worst 7/10 in low back; 7/25: 6/10 ; 8/15: pt reports 6/10; 9/26: 4/10   Time 12    Period Weeks    Status Achieved    Target Date 02/05/2022      Additional Long Term Goals   Additional Long Term Goals Yes      PT LONG TERM GOAL #6   Title Pt will increase 10MWT by at least 0.13 m/s in order to demonstrate  clinically significant improvement in community ambulation    Baseline 08/20/2021=0.94 m/s 7/5: 1.5 m/s   Time 12    Period Weeks    Status Achieved;    Target Date 11/12/21      PT LONG TERM GOAL #7   Title Patient will demonstrate improved static  standing balance as seen by ability to single leg stance on right LE > 12 sec consistently for optimal balance on level and unlevel surfaces.    Baseline 08/20/2021= 4 sec SLS on right , 7/5: 4-5 sec inconsistent; 7/25 able to achieve 15 sec after multiple attempts of 4-5 sec holds; 8/15: LLE 15 sec, RLE 8 sec; 9/26: LLE 30 sec, RLE 17 seconds   Time 12    Period Weeks    Status MET   Target Date 02/05/2022    PT LONG TERM GOAL #8  Title Pt will report ability to ambulate for at least 15 minutes without an increase in his LBP in order to improve community participation and ease with ADLs  Baseline 9/26: increase in pain at 15 min mark; 10/13: reports about 20 minutes.   Time 3   Period Weeks   Status MET  Target Date 02/05/2022        PT LONG TERM GOAL #9  Title Pt will report ability to ambulate for at least 45 minutes without an increase in his LBP in order to improve community participation and ease with ADLs  Baseline 9/26: increase in pain at 15 min mark; 10/13: reports about 20 minutes.   Time 3   Period Weeks   Status NEW  Target Date        Plan -     Clinical Impression Statement Pt responded well to the manual therapy, noting a reduction in the pain he was experiencing in the thoracic region around the R scapular area.  Pt elected to continue to perform exercises that targeted the strengthening portion of the affected side.  Pt continues to demonstrate increased load tolerance with each exercise session. Pt will continue to benefit from skilled therapy to address remaining deficits in order to improve overall QoL and return to PLOF.     Personal Factors and Comorbidities Comorbidity 3+;Past/Current Experience;Time since onset of  injury/illness/exacerbation    Comorbidities PMHx includes: Bilateral LBP with right sided sciatica, Lumbosacral spondylosis myelopathy, closed compression Fx of L1. Has chronic insomnia. osteoarthritis, Panic anxiety syndrome (taking medication)    Examination-Activity Limitations Bend;Carry;Locomotion Level;Sit;Squat;Stairs;Stand;Transfers    Examination-Participation Restrictions Cleaning;Community Activity;Driving;Laundry;Meal Prep;Occupation;Shop;Volunteer;Yard Work    Stability/Clinical Decision Making Stable/Uncomplicated    Rehab Potential Good    PT Frequency 2x / week    PT Duration 12 weeks    PT Treatment/Interventions Cryotherapy;Electrical Stimulation;Moist Heat;Traction;Gait training;Stair training;Functional mobility training;Therapeutic activities;Therapeutic exercise;Balance training;Neuromuscular re-education;Patient/family education;Manual techniques;Passive range of motion;Dry needling;Energy conservation    PT Next Visit Plan  Progress core stabilization, Manual therapy for low back and LE ROM, instruct in balance HEP, continue plan       Consulted and Agree with Plan of Care Patient              10:22 AM, 02/26/22 Etta Grandchild, PT, DPT Physical Therapist - [facility]  Outpatient Physical Therapy- [place] MAIN [facility] SERVICES [address] [place], [place], [address] Phone: [phone]   Fax:  [phone]

## 2022-02-27 NOTE — Therapy (Signed)
OUTPATIENT OCCUPATIONAL THERAPY NEURO TREATMENT NOTE   Patient Name: David Reeves MRN: [id number] DOB:[birth year], 51 y.o., male Today's Date: 02/27/2022  PCP: [name], PA-C REFERRING PROVIDER: [name], PA-C   OT End of Session - 02/27/22 1938     Visit Number 49    Number of Visits 73    Date for OT Re-Evaluation 04/16/22    Authorization Type Progress reporting period starting 01/22/22    OT Start Time 0935    OT Stop Time 1015    OT Time Calculation (min) 40 min    Activity Tolerance Patient tolerated treatment well    Behavior During Therapy [facility] for tasks assessed/performed               Past Medical History:  Diagnosis Date   Back pain 04/22/2012   Bone spur    Bulging disc 04/22/2012   Degenerative disc disease    Osteoarthritis    Panic anxiety syndrome    Stroke ([place]) 10/2020   Taking multiple medications for chronic disease    Past Surgical History:  Procedure Laterality Date   CYSTECTOMY     head   HERNIA REPAIR Left 2006,2014   [facility]   TOE SURGERY Right 2007   Patient Active Problem List   Diagnosis Date Noted   Alterations of sensations following cerebrovascular accident 03/08/2021   Aphasia as late effect of cerebrovascular accident (CVA) 03/08/2021   Muscle spasm 02/22/2021   Acute non-recurrent frontal sinusitis 02/22/2021   Chronic pain syndrome 01/23/2021   Primary hypertension 01/23/2021   Nerve pain 01/23/2021   Erectile dysfunction due to diseases classified elsewhere 01/23/2021   Cognitive dysfunction 01/23/2021   Scrotal edema 01/23/2021   Elevated LDL cholesterol level 01/23/2021   History of stroke with residual deficit 01/23/2021   Primary insomnia 01/23/2021   Mouth pain 01/23/2021   Laceration of right hand without foreign body    Closed compression fracture of L1 vertebra ([place]) 05/24/2016   Lumbar stenosis with neurogenic claudication 11/01/2015   Chronic bilateral low back pain with right-sided sciatica  11/08/2014   Hx of hemorrhoids 11/08/2014   AAA (abdominal aortic aneurysm) ([place]) 09/22/2014   Chronic pain associated with significant psychosocial dysfunction 09/22/2014   Panic disorder 09/22/2014   AB (asthmatic bronchitis) 08/17/2014   Anxiety 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 08/17/2014   Backache 08/17/2014   Lumbosacral spondylosis without myelopathy 08/17/2014   Disorder of male genital organs 08/17/2014   Brash 08/17/2014   Low back pain 08/17/2014   Tendon nodule 08/17/2014   Episodic paroxysmal anxiety disorder 08/17/2014   Hernia, inguinal, right 08/17/2014   Fast heart beat 08/17/2014   Illness 08/17/2014   Inguinal hernia 10/14/2012    ONSET DATE: 11/09/2020  REFERRING DIAG: CVA  THERAPY DIAG:  Muscle weakness (generalized)  Other lack of coordination  H/O ischemic left MCA stroke  Rationale for Evaluation and Treatment Rehabilitation  PERTINENT HISTORY: Pt. is a 51 y.o. who was diagnosed with a CVA on July 21st, 2022. Pt. completed several weeks of  inpatient rehab at [facility]. After returning to home, Pt. sustained a fall in december of 2022, and was admitted to the hospital with COVID-19, and back pain from the fall. Since the most recent discharge, Pt. has been residing with the ex-wife until the Pt. is ready to return to independent living. Pt. PMHx includes: Bilateral LBP with right sided sciatica, Lumbosacral spondylosis myelopathy, closed compression Fx of L1. Pt. enjoys cooking, and riding motorcycles.  PRECAUTIONS: Fall  SUBJECTIVE: Pt reports that he had a good weekend as he got to see his girlfriend again and he now has his truck back.  Pt feeling good that he won't have to rely on transportation services any longer.   PAIN:  Are you having pain? Yes: NPRS scale: 1/10 Pain location: low back Pain description: aching Aggravating factors: prolonged sitting or lying down Relieving factors: rest, heat, walking  [facility] OT Assessment        12/11/21  01/22/22            Observation/Other Assessments     Focus on Therapeutic Outcomes (FOTO)   46 50            Coordination     Right 9 Hole Peg Test unable  unable    Left 9 Hole Peg Test 30 sec  30 sec            Hand Function     Right Hand Grip (lbs) 35 (3rd setting) 30 lb (3rd);  41 lb (4th setting),     Right Hand Lateral Pinch 9 lbs   slipping 12 lbs (thumb slips)    Right Hand 3 Point Pinch   unable to position thumb to produce 3 pt pinch Unable to position thumb to produce 3 point pinch    Left Hand Grip (lbs) 116      Left Hand Lateral Pinch 29 lbs      Left 3 point pinch 27 lbs        OBJECTIVE:  TODAY'S TREATMENT:  Therapeutic Exercise: Participated in place and hold for thumb radial abd, resisted lateral pinch pushing into foam dowel, active assisted palmar abd x3 sets 7-10 reps each.  Performed active digit abd and active assisted add, working to strengthen intrinsics x3 sets 7-10 each.  Facilitated 3 point pinch, pushing into foam dowel, with OT providing set up and stabilization of digits in 3 point pinch pattern while pt pinched into dowel for 3 sets 10 reps each.  Practiced active opening/closing of fist with pt requiring OT assist to delay thumb to prevent thumb tucking on 25% of trials for 3 sets 10 reps.  Pt requires rest breaks between each set for above noted exercises.    Neuro re-ed: Facilitated 3 point pinch prehension patterns working to grasp ball pegs from pegboard, removing them from board and placing pegs on a towel beside the pegboard.  Pt required 1 vc to engage R LF into more flexion to help stabilize the ball peg when removing peg from board.  Pt worked on placing a peg into his R hand fingers using L hand, then using R hand to attempt to place peg back into board and was able to place 1 peg successfully back into board at end of session.    PATIENT EDUCATION: Education details: HEP progression, grasp/release activities; wrist flex against gravity,  lateral and 3 point pinch with foam dowel Person educated: Patient Education method: Explanation Education comprehension: verbalized and demonstrated understanding.      OT Short Term Goals -      OT SHORT TERM GOAL #1   Title Pt. wil demonstrtae independence with HEPs    Baseline Eval: Pt. currently does not have one, 10th visit:  changes in HEP ongoing; 20th: instructed in putty exercises, changes ongoing; 40th: continual updates to HEP as pt progresses; 50th: continual updates; 01/22/22: continual updates   Time 6    Period Weeks    Status On-going  Target Date 03/05/22             OT Long Term Goals -       OT LONG TERM GOAL #1   Title Pt. will improve RUE strength by 2 mm grades to assist with ADLs, and IADLs,    Baseline Eval: Right shoulder flexion, abduction 3/5, elbow flexion, extension wrist extension 3+/5, 10th visit: improving with RUE strength by not yet met goal; 20th visit: R shd flex/abd 4-, elbow flex/ext 4+, wrist flex 4-, wrist ext 4+; 40:  R shd flex/abd 4,  elbow flex/ext 4+, wrist flex 4, wrist ext 4+; 50th visit 8/22: R shoulder flex/abd 4/5, elbow flex/ext 5/5, wrist flex 4, wrist ext 4+; 01/22/22: shoulder flex/abd 4/5, elbow flex/ext 5/5, wrist flex 4, wrist ext 4+,    Time 12    Period Weeks    Status On-going    Target Date 04/16/22      OT LONG TERM GOAL #2   Title Pt. will improve right grip by 10 lbs to prepare for firmly holding objects for IADLs.    Baseline Eval: R: 38#, L: 124# (In 3rd dynamometer slot); R 35# in 4th slot, 28# in 3rd slot, 40th: 47 in 4th slot; 50th: 12/11/21: 46# on 3rd slot; 01/22/22: 30# on 3rd slot   Time 12    Period Weeks    Status On-going    Target Date 04/16/22      OT LONG TERM GOAL #3   Title Pt. will improve right lateral pinch strength by 5# to assist with cutting food    Baseline Eval: R: 17, L: 25; 20th: R: 18; 40th: 15# fingers slipping on meter, 50th: 9# fingers slipping; 01/22/22: 12# fingers slipping   Time  12    Period Weeks    Status On-going    Target Date 04/16/22      OT LONG TERM GOAL #4   Title Pt. will improve Right 3pt pinch by 2# to be able to hold/open items for cooking    Baseline Eval: R: Pt. unable to engage thumb L: 29#; 20th: R unable; 40th:  unable, fingers slipping; 50th:  unable, fingers slipping; 01/22/22: fingers slipping but pt is now able to grasp a ball peg with 3 point pinch pattern   Time 12    Period Weeks    Status On-going    Target Date 04/16/22      OT LONG TERM GOAL #5   Title Pt. will improve right hand [facility] skills to be able to independently manipulate buttons, and zippers.    Baseline Eval: Pt. has difficulty managing buttons,a nd zippers. 10th visit:  improving; 20th: unable to engage R hand effectively, performs 1 handed with L; 50th: performs L handed only; 01/22/22: pt can use R hand as a stabilizer with clothing fasteners with mod vc   Time 12    Period Weeks    Status On-going    Target Date 04/16/22      OT LONG TERM GOAL #6   Title Pt. will independently write his name    Baseline Eval: Pt. is unable to hold a pen; 20th: pt can grip PenAgain with R hand and can make marks on paper with difficulty, but not yet writing name; 40th:  continues to demonstrate difficulty with grasp of pen; 50th 12/11/21: With assist to set up hand on pen again, pt was able to write first name and initial of last name in 1 1/4" tall letters with fair  legibility (pt wants to defer this goal, states this is low on his list of priorities for now)   Time 12    Period Weeks    Status Defer   Target Date 04/16/22      OT LONG TERM GOAL #7   Title Pt. will improve FOTO score by 2 points to reflect funational improvement    Baseline Eval: FOTO score 46 with TR score 52; 20th: 45; 40th: 29;  50th: 46; 01/22/22: 50   Time 12    Period Weeks    Status On-going    Target Date 04/16/22             Plan -    Clinical Impression Statement Pt continues to improve with light  pressure 3 point pinching to manipulate ball pegs, removing 20+ from pegboard with 1 rest break, and was able to successfully sustain pinch to place 1 peg back into pegboard today.  Pt had been unsuccessful after multiple attempts at previous session with placement of peg back into board.  Pt continues to verbalize increased use of R hand for daily tasks, stating that he used his R hand this week to carry a large tote, as well as a big laundry detergent container.  Pt stated that his R thumb does slip beneath digits 2-5 when he was using these power grips, but nonetheless was able to sustain a grip.  Pt will continue to work towards neuro re-ed of right hand with emphasis on thumb movements to integrate into functional grasping and manipulation skills.    OT Occupational Profile and History Detailed Assessment- Review of Records and additional review of physical, cognitive, psychosocial history related to current functional performance    Occupational performance deficits (Please refer to evaluation for details): ADL's;IADL's;Education    Rehab Potential Good    Clinical Decision Making Several treatment options, min-mod task modification necessary    Comorbidities Affecting Occupational Performance: May have comorbidities impacting occupational performance    Modification or Assistance to Complete Evaluation  Min-Moderate modification of tasks or assist with assess necessary to complete eval    OT Frequency 2x / week    OT Duration 12 weeks    OT Treatment/Interventions Self-care/ADL training;DME and/or AE instruction;Therapeutic exercise;Ultrasound;Neuromuscular education;Therapeutic activities;Energy conservation;Moist Heat;Patient/family education;Splinting;Functional Mobility Training;Paraffin    Plan Neuro re-ed and therapeutic exercise    Consulted and Agree with Plan of Care Patient             Leta Speller, MS, OTR/L   Darleene Cleaver, OT 02/27/2022, 7:40 PM

## 2022-03-01 ENCOUNTER — Encounter: Payer: Medicare HMO | Admitting: Speech Pathology

## 2022-03-01 ENCOUNTER — Ambulatory Visit: Payer: Medicare Other

## 2022-03-01 DIAGNOSIS — R269 Unspecified abnormalities of gait and mobility: Secondary | ICD-10-CM

## 2022-03-01 DIAGNOSIS — M6281 Muscle weakness (generalized): Secondary | ICD-10-CM

## 2022-03-01 DIAGNOSIS — R278 Other lack of coordination: Secondary | ICD-10-CM

## 2022-03-01 DIAGNOSIS — Z8673 Personal history of transient ischemic attack (TIA), and cerebral infarction without residual deficits: Secondary | ICD-10-CM

## 2022-03-01 DIAGNOSIS — R2681 Unsteadiness on feet: Secondary | ICD-10-CM

## 2022-03-01 DIAGNOSIS — R262 Difficulty in walking, not elsewhere classified: Secondary | ICD-10-CM | POA: Diagnosis not present

## 2022-03-01 DIAGNOSIS — M545 Low back pain, unspecified: Secondary | ICD-10-CM

## 2022-03-01 DIAGNOSIS — G8929 Other chronic pain: Secondary | ICD-10-CM

## 2022-03-01 NOTE — Therapy (Signed)
[place] MAIN [facility] SERVICES [address] [place], [place], [address] Phone: [phone]   Fax:  [phone]  Patient Details  Name: David Reeves MRN: [id number] Date of Birth: [birth year] Referring Provider:  [name], PA-C  Encounter Date: 03/01/2022   OUTPATIENT PHYSICAL THERAPY TREATMENT NOTE      Patient Name: David Reeves MRN: [id number] DOB:[birth year], 51 y.o., male Today's Date: 03/01/2022  PCP: [name], PA-C REFERRING PROVIDER: [name], PA-C   PT End of Session - 03/01/22 1158     Visit Number 59    Number of Visits 27    Date for PT Re-Evaluation 04/26/22    Authorization Type [employer]    Authorization Time Period 02/01/22-04/26/22    Progress Note Due on Visit 61    PT Start Time 0845    PT Stop Time 0927    PT Time Calculation (min) 42 min    Activity Tolerance Patient tolerated treatment well;No increased pain    Behavior During Therapy [facility] for tasks assessed/performed               Past Medical History:  Diagnosis Date   Back pain 04/22/2012   Bone spur    Bulging disc 04/22/2012   Degenerative disc disease    Osteoarthritis    Panic anxiety syndrome    Stroke ([place]) 10/2020   Taking multiple medications for chronic disease    Past Surgical History:  Procedure Laterality Date   CYSTECTOMY     head   HERNIA REPAIR Left 2006,2014   [facility]   TOE SURGERY Right 2007   Patient Active Problem List   Diagnosis Date Noted   Alterations of sensations following cerebrovascular accident 03/08/2021   Aphasia as late effect of cerebrovascular accident (CVA) 03/08/2021   Muscle spasm 02/22/2021   Acute non-recurrent frontal sinusitis 02/22/2021   Chronic pain syndrome 01/23/2021   Primary hypertension 01/23/2021   Nerve pain 01/23/2021   Erectile dysfunction due to diseases classified elsewhere 01/23/2021   Cognitive dysfunction 01/23/2021   Scrotal edema  01/23/2021   Elevated LDL cholesterol level 01/23/2021   History of stroke with residual deficit 01/23/2021   Primary insomnia 01/23/2021   Mouth pain 01/23/2021   Laceration of right hand without foreign body    Closed compression fracture of L1 vertebra ([place]) 05/24/2016   Lumbar stenosis with neurogenic claudication 11/01/2015   Chronic bilateral low back pain with right-sided sciatica 11/08/2014   Hx of hemorrhoids 11/08/2014   AAA (abdominal aortic aneurysm) ([place]) 09/22/2014   Chronic pain associated with significant psychosocial dysfunction 09/22/2014   Panic disorder 09/22/2014   AB (asthmatic bronchitis) 08/17/2014   Anxiety 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 08/17/2014   Backache 08/17/2014   Lumbosacral spondylosis without myelopathy 08/17/2014   Disorder of male genital organs 08/17/2014   Brash 08/17/2014   Low back pain 08/17/2014   Tendon nodule 08/17/2014   Episodic paroxysmal anxiety disorder 08/17/2014   Hernia, inguinal, right 08/17/2014   Fast heart beat 08/17/2014   Illness 08/17/2014   Inguinal hernia 10/14/2012    REFERRING DIAG: History of stroke with residual deficit, chronic bilateral low back pain with right-sided sciatica, Lumbosacral spondylosis without myelopathy THERAPY DIAG:  Muscle weakness (generalized)  Other lack of coordination  H/O ischemic left MCA stroke  Difficulty in walking, not elsewhere classified  Unsteadiness on feet  Abnormality of gait and mobility  Chronic bilateral low back pain without sciatica  Rationale for Evaluation and  Treatment Rehabilitation  PERTINENT HISTORY: Pt. is a 51 y.o. who was diagnosed with a CVA on July 21st, 2022. Pt. completed several weeks of inpatient rehab at [facility]. After returning to home. He was discharged in late August/early September and recieved home health PT. Pt. sustained a fall in december of 2022, and was admitted to the hospital with COVID-19, and Chronic back pain. He reports  chronic back pain syndrome since 2012. Since the most recent discharge, Pt. has been residing with the ex-wife until the Pt. is ready to return to independent living. He did recieve 2 weeks of home health after discharge in December 2022. He is now being referred to outpatient PT to address weakness from stroke and improve fine motor movement. He reports rarely getting numbness/tingling in RLE. PMHx includes: Bilateral LBP with right sided sciatica, Lumbosacral spondylosis myelopathy, closed compression Fx of L1. Has chronic insomnia. osteoarthritis, Panic anxiety syndrome (taking medication) Pt. enjoys cooking, and riding motorcycles. Patient is going to [facility] spine center for Epidural spinal injections on 06/07/21;  PRECAUTIONS: Fall, Spinal Brace wears daily, approximately 25%, when exercising; semi-rigid lumbar brace  SUBJECTIVE: Patient reports 2/10 back pain - has appointment with pain clinic on Monday for possible injection. States he overdid it with shopping at [employer].     PAIN:  Are you having pain? Yes: NPRS scale: 2/10 Pain location: bilat low back Pain description: dull ache; always constant,  Aggravating factors: worse with sitting,  Relieving factors: heat helps temporarily, pain pills/pain patch   TODAY'S TREATMENT 03/01/22  Manual Therapy In prone: Central Spine PA mobilization, Grade 3, 10 sec each vertebrae- T4-L5; -sustained ischemic release to Right thoracic paraspinals 5x60sec T6-T8 level Soft tissue mobilization to thoracic and lumbar paraspinals - mostly right side Gapping right side L2-5 x 20 sec hold x 5 Rolling stick to Right side thoracic and low back.  -MHP applied to contralateral side while therapist performed STM on the treatment side  *Application of biofreeze to right side low back Patient reported feeling much improved pain after treatment but did not rate.  No pain with interventions.  PATIENT EDUCATION:  Education details: Pt educated throughout  session about proper posture and technique with exercises. Improved exercise technique, movement at target joints, use of target muscles after min to mod verbal, visual, tactile cues. Goals, plan.  Person educated: Patient Education method: Explanation, Demonstration, Tactile cues, and Verbal cues Education comprehension: verbalized understanding, returned demonstration, verbal cues required, tactile cues required, and needs further education   HOME EXERCISE PROGRAM: No updates on this date pt to continue HEP as previously given     PT Short Term Goals -       PT SHORT TERM GOAL #1   Title Patient will be adherent to HEP at least 3x a week to improve functional strength and balance for better safety at home.    Baseline 4/4: doing them 1-2x a week; 08/20/2021=Patient reports performing his low back stretching and no questions currently HEP. 10/24/21: doing HEP 4x a week   Time 4    Period Weeks    Status Achieved    Target Date 08/21/21      PT SHORT TERM GOAL #2   Title Patient (< 15 years old) will complete five times sit to stand test in < 15 seconds indicating an increased LE strength and improved balance.    Baseline 4/4: 17.85 sec with arms across chest; 08/20/2021= 17.6 sec , 7/5: 12.85 sec   Time 4  Period Weeks    Status Achieved;    Target Date 10/01/21              PT Long Term Goals -  TARGET DATE FOR GOALS 04/26/2022      PT LONG TERM GOAL #1   Title Patient will increase six minute walk test distance to >1000 for progression to community ambulator and improve gait ability    Baseline 4/4: 1115 feet , 7/5: 1080 feet;    Time 4    Period Weeks    Status Achieved    Target Date 08/21/21      PT LONG TERM GOAL #2   Title Patient will ascend/descend 8 stairs without rail assist independently without loss of balance to improve ability to get in/out of home.    Baseline 4/4: requires 1 rail assist; 08/20/2021- Patient demonstrated steps without railing using reciprocal  steps today. 7/5: ascend/descend 6 steps reciprocally without rail, but required CGA for safety when descending; 7/25: Able to complete without UE support and good balance going up steps, unsafe descending steps without UE support. Required min assist and UE support to prevent LOB descending.; 8/15: requires UUE support to descend and close CGA due to; unsteadiness; 9/26: ascend without UE support, recip pattern; descend with UUE support, recip stepping, but still doesn't feel steady yet with descending; 10/13 able to demo recip pattern ascending hands free, required instance of UE support with descending, recip pattern due to heel getting caught under step   Time 12*corrected   Period Weeks    Status Partially met   Target Date 02/05/2022      PT LONG TERM GOAL #3   Title Patient will increase BLE gross strength to 4+/5 as to improve functional strength for independent gait, increased standing tolerance and increased ADL ability.    Baseline 4/4: not formally assessed; 08/20/2021= 4+/5 with Right hip flex/knee ext/flex; Right hip abd= 4/5, 7/5: 4+/5 hip    Time 12    Period Weeks    Status Achieved;    Target Date 11/12/21      PT LONG TERM GOAL #4   Title Patient will improve FOTO score to >50% to indicate improved functional mobility with less pain with ADLs.    Baseline 4/4: 35%; 08/20/2021= 42% 7/5: 47%; 7:25:recently addressed; 11/30/21: 35; 9/26: 54%    Time 12    Period Weeks    Status MET   Target Date      PT LONG TERM GOAL #5   Title Patient will report a worst pain of 4/10 in low back over last week to indicate improved tolerance with ADLs.    Baseline 4/4: 7/10; 08/20/2021= 4/10 , 7/5: worst 7/10 in low back; 7/25: 6/10 ; 8/15: pt reports 6/10; 9/26: 4/10   Time 12    Period Weeks    Status Achieved    Target Date 02/05/2022      Additional Long Term Goals   Additional Long Term Goals Yes      PT LONG TERM GOAL #6   Title Pt will increase 10MWT by at least 0.13 m/s in order to  demonstrate clinically significant improvement in community ambulation    Baseline 08/20/2021=0.94 m/s 7/5: 1.5 m/s   Time 12    Period Weeks    Status Achieved;    Target Date 11/12/21      PT LONG TERM GOAL #7   Title Patient will demonstrate improved static standing balance as seen by ability to single  leg stance on right LE > 12 sec consistently for optimal balance on level and unlevel surfaces.    Baseline 08/20/2021= 4 sec SLS on right , 7/5: 4-5 sec inconsistent; 7/25 able to achieve 15 sec after multiple attempts of 4-5 sec holds; 8/15: LLE 15 sec, RLE 8 sec; 9/26: LLE 30 sec, RLE 17 seconds   Time 12    Period Weeks    Status MET   Target Date 02/05/2022    PT LONG TERM GOAL #8  Title Pt will report ability to ambulate for at least 15 minutes without an increase in his LBP in order to improve community participation and ease with ADLs  Baseline 9/26: increase in pain at 15 min mark; 10/13: reports about 20 minutes.   Time 3   Period Weeks   Status MET  Target Date 02/05/2022        PT LONG TERM GOAL #9  Title Pt will report ability to ambulate for at least 45 minutes without an increase in his LBP in order to improve community participation and ease with ADLs  Baseline 9/26: increase in pain at 15 min mark; 10/13: reports about 20 minutes.   Time 3   Period Weeks   Status NEW  Target Date        Plan -     Clinical Impression Statement Pt continued to respond well to the manual techniques, noting a reduction in the pain he was experiencing in the thoracic and lumbar areas today. He presented with increased tightness particularly right side thoracic and responded well to STM and later rolling stick. Deferred strengthening as patient was feeling good after manual and going for injection on Monday. Pt will continue to benefit from skilled therapy to address remaining deficits in order to improve overall QoL and return to PLOF.     Personal Factors and Comorbidities Comorbidity  3+;Past/Current Experience;Time since onset of injury/illness/exacerbation    Comorbidities PMHx includes: Bilateral LBP with right sided sciatica, Lumbosacral spondylosis myelopathy, closed compression Fx of L1. Has chronic insomnia. osteoarthritis, Panic anxiety syndrome (taking medication)    Examination-Activity Limitations Bend;Carry;Locomotion Level;Sit;Squat;Stairs;Stand;Transfers    Examination-Participation Restrictions Cleaning;Community Activity;Driving;Laundry;Meal Prep;Occupation;Shop;Volunteer;Yard Work    Stability/Clinical Decision Making Stable/Uncomplicated    Rehab Potential Good    PT Frequency 2x / week    PT Duration 12 weeks    PT Treatment/Interventions Cryotherapy;Electrical Stimulation;Moist Heat;Traction;Gait training;Stair training;Functional mobility training;Therapeutic activities;Therapeutic exercise;Balance training;Neuromuscular re-education;Patient/family education;Manual techniques;Passive range of motion;Dry needling;Energy conservation    PT Next Visit Plan  Progress core stabilization, Manual therapy for low back and LE ROM, instruct in balance HEP, continue plan       Consulted and Agree with Plan of Care Patient              12:05 PM, 03/01/22 Ollen Bowl, PT Physical Therapist - [facility]  Outpatient Physical Therapy- [place] [address] [place], [place], [address] Phone: [phone]   Fax:  [phone]

## 2022-03-01 NOTE — Therapy (Unsigned)
OUTPATIENT OCCUPATIONAL THERAPY NEURO TREATMENT NOTE   Patient Name: David Reeves MRN: [id number] DOB:Dec 23, 1970, 51 y.o., male Today's Date: 03/01/2022  PCP: [name], PA-C REFERRING PROVIDER: [name], PA-C   OT End of Session - 03/01/22 0937     Visit Number 39    Number of Visits 19    Date for OT Re-Evaluation 04/16/22    Authorization Type Progress reporting period starting 01/22/22    OT Start Time 0933    OT Stop Time 1015    OT Time Calculation (min) 42 min    Activity Tolerance Patient tolerated treatment well    Behavior During Therapy [facility] for tasks assessed/performed               Past Medical History:  Diagnosis Date   Back pain 04/22/2012   Bone spur    Bulging disc 04/22/2012   Degenerative disc disease    Osteoarthritis    Panic anxiety syndrome    Stroke ([place]) 10/2020   Taking multiple medications for chronic disease    Past Surgical History:  Procedure Laterality Date   CYSTECTOMY     head   HERNIA REPAIR Left 2006,2014   [facility]   TOE SURGERY Right 2007   Patient Active Problem List   Diagnosis Date Noted   Alterations of sensations following cerebrovascular accident 03/08/2021   Aphasia as late effect of cerebrovascular accident (CVA) 03/08/2021   Muscle spasm 02/22/2021   Acute non-recurrent frontal sinusitis 02/22/2021   Chronic pain syndrome 01/23/2021   Primary hypertension 01/23/2021   Nerve pain 01/23/2021   Erectile dysfunction due to diseases classified elsewhere 01/23/2021   Cognitive dysfunction 01/23/2021   Scrotal edema 01/23/2021   Elevated LDL cholesterol level 01/23/2021   History of stroke with residual deficit 01/23/2021   Primary insomnia 01/23/2021   Mouth pain 01/23/2021   Laceration of right hand without foreign body    Closed compression fracture of L1 vertebra ([place]) 05/24/2016   Lumbar stenosis with neurogenic claudication 11/01/2015   Chronic bilateral low back pain with right-sided  sciatica 11/08/2014   Hx of hemorrhoids 11/08/2014   AAA (abdominal aortic aneurysm) ([place]) 09/22/2014   Chronic pain associated with significant psychosocial dysfunction 09/22/2014   Panic disorder 09/22/2014   AB (asthmatic bronchitis) 08/17/2014   Anxiety 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 08/17/2014   Backache 08/17/2014   Lumbosacral spondylosis without myelopathy 08/17/2014   Disorder of male genital organs 08/17/2014   Brash 08/17/2014   Low back pain 08/17/2014   Tendon nodule 08/17/2014   Episodic paroxysmal anxiety disorder 08/17/2014   Hernia, inguinal, right 08/17/2014   Fast heart beat 08/17/2014   Illness 08/17/2014   Inguinal hernia 10/14/2012    ONSET DATE: 11/09/2020  REFERRING DIAG: CVA  THERAPY DIAG:  Muscle weakness (generalized)  Other lack of coordination  H/O ischemic left MCA stroke  Rationale for Evaluation and Treatment Rehabilitation  PERTINENT HISTORY: Pt. is a 51 y.o. who was diagnosed with a CVA on July 21st, 2022. Pt. completed several weeks of  inpatient rehab at [facility]. After returning to home, Pt. sustained a fall in december of 2022, and was admitted to the hospital with COVID-19, and back pain from the fall. Since the most recent discharge, Pt. has been residing with the ex-wife until the Pt. is ready to return to independent living. Pt. PMHx includes: Bilateral LBP with right sided sciatica, Lumbosacral spondylosis myelopathy, closed compression Fx of L1. Pt. enjoys cooking, and riding motorcycles.  PRECAUTIONS: Fall  SUBJECTIVE: Pt reports that he had a good weekend as he got to see his girlfriend again and he now has his truck back.  Pt feeling good that he won't have to rely on transportation services any longer.   PAIN:  Are you having pain? Yes: NPRS scale: 2/10 Pain location: low back Pain description: aching Aggravating factors: prolonged sitting or lying down Relieving factors: rest, heat, walking  [facility] OT Assessment         12/11/21 01/22/22            Observation/Other Assessments     Focus on Therapeutic Outcomes (FOTO)   46 50            Coordination     Right 9 Hole Peg Test unable  unable    Left 9 Hole Peg Test 30 sec  30 sec            Hand Function     Right Hand Grip (lbs) 35 (3rd setting) 30 lb (3rd);  41 lb (4th setting),     Right Hand Lateral Pinch 9 lbs   slipping 12 lbs (thumb slips)    Right Hand 3 Point Pinch   unable to position thumb to produce 3 pt pinch Unable to position thumb to produce 3 point pinch    Left Hand Grip (lbs) 116      Left Hand Lateral Pinch 29 lbs      Left 3 point pinch 27 lbs        OBJECTIVE:  TODAY'S TREATMENT:  Therapeutic Exercise: Participated in place and hold for thumb radial abd, resisted lateral pinch pushing into foam dowel, active assisted palmar abd x3 sets 7-10 reps each.  Performed active digit abd and active assisted add, working to strengthen intrinsics x3 sets 7-10 each.  Facilitated 3 point pinch, pushing into foam dowel, with OT providing set up and stabilization of digits in 3 point pinch pattern while pt pinched into dowel for 3 sets 10 reps each.  Practiced active opening/closing of fist with pt requiring OT assist to delay thumb to prevent thumb tucking on 25% of trials for 3 sets 10 reps.  Pt requires rest breaks between each set for above noted exercises.    Neuro re-ed: Facilitated 3 point pinch prehension patterns working to grasp ball pegs from pegboard, removing them from board and placing pegs on a towel beside the pegboard.  Pt required 1 vc to engage R LF into more flexion to help stabilize the ball peg when removing peg from board.  Pt worked on placing a peg into his R hand fingers using L hand, then using R hand to attempt to place peg back into board and was able to place 1 peg successfully back into board at end of session.    PATIENT EDUCATION: Education details: HEP progression, grasp/release activities; wrist flex against  gravity, lateral and 3 point pinch with foam dowel Person educated: Patient Education method: Explanation Education comprehension: verbalized and demonstrated understanding.      OT Short Term Goals -      OT SHORT TERM GOAL #1   Title Pt. wil demonstrtae independence with HEPs    Baseline Eval: Pt. currently does not have one, 10th visit:  changes in HEP ongoing; 20th: instructed in putty exercises, changes ongoing; 40th: continual updates to HEP as pt progresses; 50th: continual updates; 01/22/22: continual updates   Time 6    Period Weeks    Status On-going  Target Date 03/05/22             OT Long Term Goals -       OT LONG TERM GOAL #1   Title Pt. will improve RUE strength by 2 mm grades to assist with ADLs, and IADLs,    Baseline Eval: Right shoulder flexion, abduction 3/5, elbow flexion, extension wrist extension 3+/5, 10th visit: improving with RUE strength by not yet met goal; 20th visit: R shd flex/abd 4-, elbow flex/ext 4+, wrist flex 4-, wrist ext 4+; 40:  R shd flex/abd 4,  elbow flex/ext 4+, wrist flex 4, wrist ext 4+; 50th visit 8/22: R shoulder flex/abd 4/5, elbow flex/ext 5/5, wrist flex 4, wrist ext 4+; 01/22/22: shoulder flex/abd 4/5, elbow flex/ext 5/5, wrist flex 4, wrist ext 4+,    Time 12    Period Weeks    Status On-going    Target Date 04/16/22      OT LONG TERM GOAL #2   Title Pt. will improve right grip by 10 lbs to prepare for firmly holding objects for IADLs.    Baseline Eval: R: 38#, L: 124# (In 3rd dynamometer slot); R 35# in 4th slot, 28# in 3rd slot, 40th: 47 in 4th slot; 50th: 12/11/21: 46# on 3rd slot; 01/22/22: 30# on 3rd slot   Time 12    Period Weeks    Status On-going    Target Date 04/16/22      OT LONG TERM GOAL #3   Title Pt. will improve right lateral pinch strength by 5# to assist with cutting food    Baseline Eval: R: 17, L: 25; 20th: R: 18; 40th: 15# fingers slipping on meter, 50th: 9# fingers slipping; 01/22/22: 12# fingers  slipping   Time 12    Period Weeks    Status On-going    Target Date 04/16/22      OT LONG TERM GOAL #4   Title Pt. will improve Right 3pt pinch by 2# to be able to hold/open items for cooking    Baseline Eval: R: Pt. unable to engage thumb L: 29#; 20th: R unable; 40th:  unable, fingers slipping; 50th:  unable, fingers slipping; 01/22/22: fingers slipping but pt is now able to grasp a ball peg with 3 point pinch pattern   Time 12    Period Weeks    Status On-going    Target Date 04/16/22      OT LONG TERM GOAL #5   Title Pt. will improve right hand [facility] skills to be able to independently manipulate buttons, and zippers.    Baseline Eval: Pt. has difficulty managing buttons,a nd zippers. 10th visit:  improving; 20th: unable to engage R hand effectively, performs 1 handed with L; 50th: performs L handed only; 01/22/22: pt can use R hand as a stabilizer with clothing fasteners with mod vc   Time 12    Period Weeks    Status On-going    Target Date 04/16/22      OT LONG TERM GOAL #6   Title Pt. will independently write his name    Baseline Eval: Pt. is unable to hold a pen; 20th: pt can grip PenAgain with R hand and can make marks on paper with difficulty, but not yet writing name; 40th:  continues to demonstrate difficulty with grasp of pen; 50th 12/11/21: With assist to set up hand on pen again, pt was able to write first name and initial of last name in 1 1/4" tall letters with fair  legibility (pt wants to defer this goal, states this is low on his list of priorities for now)   Time 12    Period Weeks    Status Defer   Target Date 04/16/22      OT LONG TERM GOAL #7   Title Pt. will improve FOTO score by 2 points to reflect funational improvement    Baseline Eval: FOTO score 46 with TR score 52; 20th: 45; 40th: 68;  50th: 46; 01/22/22: 50   Time 12    Period Weeks    Status On-going    Target Date 04/16/22             Plan -    Clinical Impression Statement Pt continues to  improve with light pressure 3 point pinching to manipulate ball pegs, removing 20+ from pegboard with 1 rest break, and was able to successfully sustain pinch to place 1 peg back into pegboard today.  Pt had been unsuccessful after multiple attempts at previous session with placement of peg back into board.  Pt continues to verbalize increased use of R hand for daily tasks, stating that he used his R hand this week to carry a large tote, as well as a big laundry detergent container.  Pt stated that his R thumb does slip beneath digits 2-5 when he was using these power grips, but nonetheless was able to sustain a grip.  Pt will continue to work towards neuro re-ed of right hand with emphasis on thumb movements to integrate into functional grasping and manipulation skills.    OT Occupational Profile and History Detailed Assessment- Review of Records and additional review of physical, cognitive, psychosocial history related to current functional performance    Occupational performance deficits (Please refer to evaluation for details): ADL's;IADL's;Education    Rehab Potential Good    Clinical Decision Making Several treatment options, min-mod task modification necessary    Comorbidities Affecting Occupational Performance: May have comorbidities impacting occupational performance    Modification or Assistance to Complete Evaluation  Min-Moderate modification of tasks or assist with assess necessary to complete eval    OT Frequency 2x / week    OT Duration 12 weeks    OT Treatment/Interventions Self-care/ADL training;DME and/or AE instruction;Therapeutic exercise;Ultrasound;Neuromuscular education;Therapeutic activities;Energy conservation;Moist Heat;Patient/family education;Splinting;Functional Mobility Training;Paraffin    Plan Neuro re-ed and therapeutic exercise    Consulted and Agree with Plan of Care Patient             Leta Speller, MS, OTR/L   Darleene Cleaver, OT 03/01/2022, 9:39 AM

## 2022-03-05 ENCOUNTER — Ambulatory Visit: Payer: Medicare Other

## 2022-03-05 ENCOUNTER — Encounter: Payer: Medicare HMO | Admitting: Speech Pathology

## 2022-03-05 DIAGNOSIS — R278 Other lack of coordination: Secondary | ICD-10-CM

## 2022-03-05 DIAGNOSIS — R262 Difficulty in walking, not elsewhere classified: Secondary | ICD-10-CM | POA: Diagnosis not present

## 2022-03-05 DIAGNOSIS — Z8673 Personal history of transient ischemic attack (TIA), and cerebral infarction without residual deficits: Secondary | ICD-10-CM

## 2022-03-05 DIAGNOSIS — M6281 Muscle weakness (generalized): Secondary | ICD-10-CM

## 2022-03-05 DIAGNOSIS — R2681 Unsteadiness on feet: Secondary | ICD-10-CM

## 2022-03-05 NOTE — Therapy (Signed)
OUTPATIENT OCCUPATIONAL THERAPY NEURO  TREATMENT NOTE   Patient Name: David Reeves MRN: [id number] DOB:02/10/1971, 51 y.o., male Today's Date: 03/05/2022  PCP: [name], PA-C REFERRING PROVIDER: [name], PA-C   OT End of Session - 03/05/22 2318     Visit Number 71    Number of Visits 34    Date for OT Re-Evaluation 04/16/22    Authorization Type Progress reporting period starting 03/01/22    OT Start Time 0915    OT Stop Time 1000    OT Time Calculation (min) 45 min    Activity Tolerance Patient tolerated treatment well    Behavior During Therapy [facility] for tasks assessed/performed               Past Medical History:  Diagnosis Date   Back pain 04/22/2012   Bone spur    Bulging disc 04/22/2012   Degenerative disc disease    Osteoarthritis    Panic anxiety syndrome    Stroke ([place]) 10/2020   Taking multiple medications for chronic disease    Past Surgical History:  Procedure Laterality Date   CYSTECTOMY     head   HERNIA REPAIR Left 2006,2014   [facility]   TOE SURGERY Right 2007   Patient Active Problem List   Diagnosis Date Noted   Alterations of sensations following cerebrovascular accident 03/08/2021   Aphasia as late effect of cerebrovascular accident (CVA) 03/08/2021   Muscle spasm 02/22/2021   Acute non-recurrent frontal sinusitis 02/22/2021   Chronic pain syndrome 01/23/2021   Primary hypertension 01/23/2021   Nerve pain 01/23/2021   Erectile dysfunction due to diseases classified elsewhere 01/23/2021   Cognitive dysfunction 01/23/2021   Scrotal edema 01/23/2021   Elevated LDL cholesterol level 01/23/2021   History of stroke with residual deficit 01/23/2021   Primary insomnia 01/23/2021   Mouth pain 01/23/2021   Laceration of right hand without foreign body    Closed compression fracture of L1 vertebra ([place]) 05/24/2016   Lumbar stenosis with neurogenic claudication 11/01/2015   Chronic bilateral low back pain with right-sided  sciatica 11/08/2014   Hx of hemorrhoids 11/08/2014   AAA (abdominal aortic aneurysm) ([place]) 09/22/2014   Chronic pain associated with significant psychosocial dysfunction 09/22/2014   Panic disorder 09/22/2014   AB (asthmatic bronchitis) 08/17/2014   Anxiety 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 08/17/2014   Backache 08/17/2014   Lumbosacral spondylosis without myelopathy 08/17/2014   Disorder of male genital organs 08/17/2014   Brash 08/17/2014   Low back pain 08/17/2014   Tendon nodule 08/17/2014   Episodic paroxysmal anxiety disorder 08/17/2014   Hernia, inguinal, right 08/17/2014   Fast heart beat 08/17/2014   Illness 08/17/2014   Inguinal hernia 10/14/2012    ONSET DATE: 11/09/2020  REFERRING DIAG: CVA  THERAPY DIAG:  Muscle weakness (generalized)  Other lack of coordination  H/O ischemic left MCA stroke  Rationale for Evaluation and Treatment Rehabilitation  PERTINENT HISTORY: Pt. is a 51 y.o. who was diagnosed with a CVA on July 21st, 2022. Pt. completed several weeks of  inpatient rehab at [facility]. After returning to home, Pt. sustained a fall in december of 2022, and was admitted to the hospital with COVID-19, and back pain from the fall. Since the most recent discharge, Pt. has been residing with the ex-wife until the Pt. is ready to return to independent living. Pt. PMHx includes: Bilateral LBP with right sided sciatica, Lumbosacral spondylosis myelopathy, closed compression Fx of L1. Pt. enjoys cooking, and riding motorcycles.  PRECAUTIONS: Fall  SUBJECTIVE: Pt reports he got a lidocaine injection for his back since last seen by OT, so he's feeling better.  PAIN:  Are you having pain? Yes: NPRS scale: 1/10 Pain location: low back Pain description: aching Aggravating factors: prolonged sitting or lying down Relieving factors: rest, heat, walking  [facility] OT Assessment        12/11/21 01/22/22 03/01/22             Observation/Other Assessments      Focus on  Therapeutic Outcomes (FOTO)   46 50 49             Coordination      Right 9 Hole Peg Test unable  unable unable    Left 9 Hole Peg Test 30 sec  30 sec NT             Hand Function      Right Hand Grip (lbs) 35 (3rd setting) 30 lb (3rd);  41 lb (4th setting),  44 lbs (4th setting)    Right Hand Lateral Pinch 9 lbs   slipping 12 lbs (thumb slips) 16 lbs (multiple trials d/t thumb slips)    Right Hand 3 Point Pinch   unable to position thumb to produce 3 pt pinch Unable to position thumb to produce 3 point pinch unable    Left Hand Grip (lbs) 116       Left Hand Lateral Pinch 29 lbs       Left 3 point pinch 27 lbs         OBJECTIVE:  TODAY'S TREATMENT:  Therapeutic Exercise: Participated in place and hold for thumb radial abd, resisted lateral pinch pushing into foam dowel, active assisted palmar abd x3 sets 7-10 reps each.  Performed active digit abd and active assisted add, working to strengthen intrinsics x3 sets 7-10 each.  Facilitated 3 point pinch, pushing into foam dowel, with OT providing set up and stabilization of digits in 3 point pinch pattern while pt pinched into dowel for 3 sets 10 reps each.  Pt requires rest breaks between each set for above noted exercises.     Neuro re-ed: Facilitated 3 point pinch prehension patterns working to grasp round wooden blocks, positioned flat on table and upright on side (more narrow pinch).  Practiced wider grip picking up foam cup from table top and setting back down (unable to set back down without tipping cup over).  Required vc for pinch modulation for grasping block and cup.   PATIENT EDUCATION: Education details: HEP progression, grasp/release activities; wrist flex against gravity, lateral and 3 point pinch with foam dowel Person educated: Patient Education method: Explanation Education comprehension: verbalized and demonstrated understanding.      OT Short Term Goals -      OT SHORT TERM GOAL #1   Title Pt. wil demonstrtae  independence with HEPs    Baseline Eval: Pt. currently does not have one, 10th visit:  changes in HEP ongoing; 20th: instructed in putty exercises, changes ongoing; 40th: continual updates to HEP as pt progresses; 50th: continual updates; 01/22/22: continual updates; 03/01/22: continual updates   Time 6    Period Weeks    Status On-going    Target Date 03/05/22             OT Long Term Goals -       OT LONG TERM GOAL #1   Title Pt. will improve RUE strength by 2 mm grades to assist with ADLs, and IADLs,  Baseline Eval: Right shoulder flexion, abduction 3/5, elbow flexion, extension wrist extension 3+/5, 10th visit: improving with RUE strength by not yet met goal; 20th visit: R shd flex/abd 4-, elbow flex/ext 4+, wrist flex 4-, wrist ext 4+; 40:  R shd flex/abd 4,  elbow flex/ext 4+, wrist flex 4, wrist ext 4+; 50th visit 8/22: R shoulder flex/abd 4/5, elbow flex/ext 5/5, wrist flex 4, wrist ext 4+; 01/22/22: shoulder flex/abd 4/5, elbow flex/ext 5/5, wrist flex 4, wrist ext 4+; 03/01/22: same as 01/22/22   Time 12    Period Weeks    Status On-going    Target Date 04/16/22      OT LONG TERM GOAL #2   Title Pt. will improve right grip by 10 lbs to prepare for firmly holding objects for IADLs.    Baseline Eval: R: 38#, L: 124# (In 3rd dynamometer slot); R 35# in 4th slot, 28# in 3rd slot, 40th: 47 in 4th slot; 50th: 12/11/21: 46# on 3rd slot; 01/22/22: 30# on 3rd slot; 03/01/22: 44# 4th slot   Time 12    Period Weeks    Status On-going    Target Date 04/16/22      OT LONG TERM GOAL #3   Title Pt. will improve right lateral pinch strength by 5# to assist with cutting food    Baseline Eval: R: 17, L: 25; 20th: R: 18; 40th: 15# fingers slipping on meter, 50th: 9# fingers slipping; 01/22/22: 12# fingers slipping; 03/01/22: 16#   Time 12    Period Weeks    Status On-going    Target Date 04/16/22      OT LONG TERM GOAL #4   Title Pt. will improve Right 3pt pinch by 2# to be able to  hold/open items for cooking    Baseline Eval: R: Pt. unable to engage thumb L: 29#; 20th: R unable; 40th:  unable, fingers slipping; 50th:  unable, fingers slipping; 01/22/22: fingers slipping but pt is now able to grasp a ball peg with 3 point pinch pattern; 03/01/22: same as 10/3 in addition to being able to pick up ball peg from pegboard then place peg back into pegboard with a 3 point pinch pattern.   Time 12    Period Weeks    Status On-going    Target Date 04/16/22      OT LONG TERM GOAL #5   Title Pt. will improve right hand [facility] skills to be able to independently manipulate buttons, and zippers.    Baseline Eval: Pt. has difficulty managing buttons,a nd zippers. 10th visit:  improving; 20th: unable to engage R hand effectively, performs 1 handed with L; 50th: performs L handed only; 01/22/22: pt can use R hand as a stabilizer with clothing fasteners with mod vc; 03/01/22: same as 01/22/22   Time 12    Period Weeks    Status On-going    Target Date 04/16/22      OT LONG TERM GOAL #6   Title Pt. will independently write his name    Baseline Eval: Pt. is unable to hold a pen; 20th: pt can grip PenAgain with R hand and can make marks on paper with difficulty, but not yet writing name; 40th:  continues to demonstrate difficulty with grasp of pen; 50th 12/11/21: With assist to set up hand on pen again, pt was able to write first name and initial of last name in 1 1/4" tall letters with fair legibility (pt wants to defer this goal, states this is low  on his list of priorities for now)   Time 12    Period Weeks    Status Defer   Target Date 04/16/22      OT LONG TERM GOAL #7   Title Pt. will improve FOTO score by 2 points to reflect funational improvement    Baseline Eval: FOTO score 46 with TR score 52; 20th: 45; 40th: 51;  50th: 46; 01/22/22: 50; 03/01/22: 49   Time 12    Period Weeks    Status On-going    Target Date 04/16/22             Plan -    Clinical Impression Statement Pt  continues to focus on intrinsic strengthening and pinch prehension patterns with lateral and 3 point pinch.  Facilitated 3 point pinch prehension patterns working to grasp round wooden blocks, positioned flat on table and upright on side (more narrow pinch).  Frequent dropping with both positions.  Practiced wider grip picking up foam cup from table top and setting back down (unable to set back down without tipping cup over).  Required vc for pinch modulation for grasping block and cup.  Pt will continue to work towards neuro re-ed of right hand with emphasis on thumb movements to integrate into functional grasping and manipulation skills.    OT Occupational Profile and History Detailed Assessment- Review of Records and additional review of physical, cognitive, psychosocial history related to current functional performance    Occupational performance deficits (Please refer to evaluation for details): ADL's;IADL's;Education    Rehab Potential Good    Clinical Decision Making Several treatment options, min-mod task modification necessary    Comorbidities Affecting Occupational Performance: May have comorbidities impacting occupational performance    Modification or Assistance to Complete Evaluation  Min-Moderate modification of tasks or assist with assess necessary to complete eval    OT Frequency 2x / week    OT Duration 12 weeks    OT Treatment/Interventions Self-care/ADL training;DME and/or AE instruction;Therapeutic exercise;Ultrasound;Neuromuscular education;Therapeutic activities;Energy conservation;Moist Heat;Patient/family education;Splinting;Functional Mobility Training;Paraffin    Plan Neuro re-ed and therapeutic exercise    Consulted and Agree with Plan of Care Patient             Leta Speller, MS, OTR/L   Darleene Cleaver, OT 03/05/2022, 11:21 PM

## 2022-03-05 NOTE — Therapy (Signed)
[place] MAIN [facility] SERVICES [address] [place], [place], [address] Phone: [phone]   Fax:  [phone]  Patient Details  Name: David Reeves MRN: [id number] Date of Birth: Sep 03, 1970 Referring Provider:  [name], PA-C  Encounter Date: 03/05/2022   OUTPATIENT PHYSICAL THERAPY TREATMENT NOTE/Physical Therapy Progress Note   Dates of reporting period  01/15/2022   to   03/05/2022       Patient Name: David Reeves MRN: [id number] DOB:[birth year], 51 y.o., male Today's Date: 03/05/2022  PCP: [name], PA-C REFERRING PROVIDER: [name], PA-C   PT End of Session - 03/05/22 1323     Visit Number 60    Number of Visits 66    Date for PT Re-Evaluation 04/26/22    Authorization Type [employer]    Authorization Time Period 02/01/22-04/26/22    Progress Note Due on Visit 74    PT Start Time 1016    PT Stop Time 1100    PT Time Calculation (min) 44 min    Equipment Utilized During Treatment Gait belt    Activity Tolerance Patient tolerated treatment well;No increased pain    Behavior During Therapy [facility] for tasks assessed/performed                Past Medical History:  Diagnosis Date   Back pain 04/22/2012   Bone spur    Bulging disc 04/22/2012   Degenerative disc disease    Osteoarthritis    Panic anxiety syndrome    Stroke ([place]) 10/2020   Taking multiple medications for chronic disease    Past Surgical History:  Procedure Laterality Date   CYSTECTOMY     head   HERNIA REPAIR Left 2006,2014   [facility]   TOE SURGERY Right 2007   Patient Active Problem List   Diagnosis Date Noted   Alterations of sensations following cerebrovascular accident 03/08/2021   Aphasia as late effect of cerebrovascular accident (CVA) 03/08/2021   Muscle spasm 02/22/2021   Acute non-recurrent frontal sinusitis 02/22/2021   Chronic pain syndrome 01/23/2021   Primary hypertension 01/23/2021   Nerve  pain 01/23/2021   Erectile dysfunction due to diseases classified elsewhere 01/23/2021   Cognitive dysfunction 01/23/2021   Scrotal edema 01/23/2021   Elevated LDL cholesterol level 01/23/2021   History of stroke with residual deficit 01/23/2021   Primary insomnia 01/23/2021   Mouth pain 01/23/2021   Laceration of right hand without foreign body    Closed compression fracture of L1 vertebra ([place]) 05/24/2016   Lumbar stenosis with neurogenic claudication 11/01/2015   Chronic bilateral low back pain with right-sided sciatica 11/08/2014   Hx of hemorrhoids 11/08/2014   AAA (abdominal aortic aneurysm) ([place]) 09/22/2014   Chronic pain associated with significant psychosocial dysfunction 09/22/2014   Panic disorder 09/22/2014   AB (asthmatic bronchitis) 08/17/2014   Anxiety 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 08/17/2014   Backache 08/17/2014   Lumbosacral spondylosis without myelopathy 08/17/2014   Disorder of male genital organs 08/17/2014   Brash 08/17/2014   Low back pain 08/17/2014   Tendon nodule 08/17/2014   Episodic paroxysmal anxiety disorder 08/17/2014   Hernia, inguinal, right 08/17/2014   Fast heart beat 08/17/2014   Illness 08/17/2014   Inguinal hernia 10/14/2012    REFERRING DIAG: History of stroke with residual deficit, chronic bilateral low back pain with right-sided sciatica, Lumbosacral spondylosis without myelopathy THERAPY DIAG:  Unsteadiness on feet  Muscle weakness (generalized)  Other lack of coordination  Rationale for Evaluation  and Treatment Rehabilitation  PERTINENT HISTORY: Pt. is a 51 y.o. who was diagnosed with a CVA on July 21st, 2022. Pt. completed several weeks of inpatient rehab at [facility]. After returning to home. He was discharged in late August/early September and recieved home health PT. Pt. sustained a fall in december of 2022, and was admitted to the hospital with COVID-19, and Chronic back pain. He reports chronic back pain syndrome since  2012. Since the most recent discharge, Pt. has been residing with the ex-wife until the Pt. is ready to return to independent living. He did recieve 2 weeks of home health after discharge in December 2022. He is now being referred to outpatient PT to address weakness from stroke and improve fine motor movement. He reports rarely getting numbness/tingling in RLE. PMHx includes: Bilateral LBP with right sided sciatica, Lumbosacral spondylosis myelopathy, closed compression Fx of L1. Has chronic insomnia. osteoarthritis, Panic anxiety syndrome (taking medication) Pt. enjoys cooking, and riding motorcycles. Patient is going to [facility] spine center for Epidural spinal injections on 06/07/21;  PRECAUTIONS: Fall, Spinal Brace wears daily, approximately 25%, when exercising; semi-rigid lumbar brace  SUBJECTIVE:Pt reports 1/10 LBP. Had low back injection yesterday, reports he is going to get them every 2 weeks.    PAIN:  Are you having pain? Yes: NPRS scale: 1/10 Pain location: bilat low back Pain description: dull ache; always constant,  Aggravating factors: worse with sitting,  Relieving factors: heat helps temporarily, pain pills/pain patch   TODAY'S TREATMENT 03/05/22  Heat applied to low back while he performs the following (skin checked is WNL prior to application and upon removal of heat, no adverse reaction):  On mat table- LTRs 20x each way Bridges 10x Single leg glute bridge 10x2 sets each LE. Rates medium.  Side-lye hip abd with 3# AW 2x15 each LE. Reports difficulty with coordination RLE. Fatiguing. Requires TC from PT for technique  Leg press 115# 15x 130# 2x8 Fatigued by end of intervention  NMR:  Half-foam static stand 60 sec   -progressed to performing with vertical, head turns and then vertical head turns with UE reaches  Sustained heel raise 2x30 sec   Step onto and off 6" block  x multiple reps. Difficulty with foot clearance.   Reports no increase in back pain with  interventions  PATIENT EDUCATION:  Education details: Pt educated throughout session about proper posture and technique with exercises. Improved exercise technique, movement at target joints, use of target muscles after min to mod verbal, visual, tactile cues. Goals, plan.  Person educated: Patient Education method: Explanation, Demonstration, Tactile cues, and Verbal cues Education comprehension: verbalized understanding, returned demonstration, verbal cues required, tactile cues required, and needs further education   HOME EXERCISE PROGRAM: No updates on this date pt to continue HEP as previously given     PT Short Term Goals -       PT SHORT TERM GOAL #1   Title Patient will be adherent to HEP at least 3x a week to improve functional strength and balance for better safety at home.    Baseline 4/4: doing them 1-2x a week; 08/20/2021=Patient reports performing his low back stretching and no questions currently HEP. 10/24/21: doing HEP 4x a week   Time 4    Period Weeks    Status Achieved    Target Date 08/21/21      PT SHORT TERM GOAL #2   Title Patient (< 65 years old) will complete five times sit to stand test in <  15 seconds indicating an increased LE strength and improved balance.    Baseline 4/4: 17.85 sec with arms across chest; 08/20/2021= 17.6 sec , 7/5: 12.85 sec   Time 4    Period Weeks    Status Achieved;    Target Date 10/01/21              PT Long Term Goals -  TARGET DATE FOR GOALS 04/26/2022      PT LONG TERM GOAL #1   Title Patient will increase six minute walk test distance to >1000 for progression to community ambulator and improve gait ability    Baseline 4/4: 1115 feet , 7/5: 1080 feet;    Time 4    Period Weeks    Status Achieved    Target Date 08/21/21      PT LONG TERM GOAL #2   Title Patient will ascend/descend 8 stairs without rail assist independently without loss of balance to improve ability to get in/out of home.    Baseline 4/4: requires 1  rail assist; 08/20/2021- Patient demonstrated steps without railing using reciprocal steps today. 7/5: ascend/descend 6 steps reciprocally without rail, but required CGA for safety when descending; 7/25: Able to complete without UE support and good balance going up steps, unsafe descending steps without UE support. Required min assist and UE support to prevent LOB descending.; 8/15: requires UUE support to descend and close CGA due to; unsteadiness; 9/26: ascend without UE support, recip pattern; descend with UUE support, recip stepping, but still doesn't feel steady yet with descending; 10/13 able to demo recip pattern ascending hands free, required instance of UE support with descending, recip pattern due to heel getting caught under step; 11/14: deferred   Time 12*corrected   Period Weeks    Status Partially met   Target Date 02/05/2022      PT LONG TERM GOAL #3   Title Patient will increase BLE gross strength to 4+/5 as to improve functional strength for independent gait, increased standing tolerance and increased ADL ability.    Baseline 4/4: not formally assessed; 08/20/2021= 4+/5 with Right hip flex/knee ext/flex; Right hip abd= 4/5, 7/5: 4+/5 hip    Time 12    Period Weeks    Status Achieved;    Target Date 11/12/21      PT LONG TERM GOAL #4   Title Patient will improve FOTO score to >50% to indicate improved functional mobility with less pain with ADLs.    Baseline 4/4: 35%; 08/20/2021= 42% 7/5: 47%; 7:25:recently addressed; 11/30/21: 35; 9/26: 54%    Time 12    Period Weeks    Status MET   Target Date      PT LONG TERM GOAL #5   Title Patient will report a worst pain of 4/10 in low back over last week to indicate improved tolerance with ADLs.    Baseline 4/4: 7/10; 08/20/2021= 4/10 , 7/5: worst 7/10 in low back; 7/25: 6/10 ; 8/15: pt reports 6/10; 9/26: 4/10   Time 12    Period Weeks    Status Achieved    Target Date 02/05/2022      Additional Long Term Goals   Additional Long Term  Goals Yes      PT LONG TERM GOAL #6   Title Pt will increase 10MWT by at least 0.13 m/s in order to demonstrate clinically significant improvement in community ambulation    Baseline 08/20/2021=0.94 m/s 7/5: 1.5 m/s   Time 12    Period Weeks  Status Achieved;    Target Date 11/12/21      PT LONG TERM GOAL #7   Title Patient will demonstrate improved static standing balance as seen by ability to single leg stance on right LE > 12 sec consistently for optimal balance on level and unlevel surfaces.    Baseline 08/20/2021= 4 sec SLS on right , 7/5: 4-5 sec inconsistent; 7/25 able to achieve 15 sec after multiple attempts of 4-5 sec holds; 8/15: LLE 15 sec, RLE 8 sec; 9/26: LLE 30 sec, RLE 17 seconds   Time 12    Period Weeks    Status MET   Target Date 02/05/2022    PT LONG TERM GOAL #8  Title Pt will report ability to ambulate for at least 15 minutes without an increase in his LBP in order to improve community participation and ease with ADLs  Baseline 9/26: increase in pain at 15 min mark; 10/13: reports about 20 minutes.   Time 3   Period Weeks   Status MET  Target Date 02/05/2022        PT LONG TERM GOAL #9  Title Pt will report ability to ambulate for at least 45 minutes without an increase in his LBP in order to improve community participation and ease with ADLs  Baseline 9/26: increase in pain at 15 min mark; 10/13: reports about 20 minutes. 11/14: deferred  Time 3   Period Weeks   Status NEW  Target Date        Plan -     Clinical Impression Statement Pt baseline pain level continues to decrease, now reporting baseline is typically 1/10. While pt shows improvement, he still exhibits difficulty with balance activities. Goal testing deferred to next 1-2 sessions. The pt will continue to benefit from skilled therapy to address remaining deficits in order to improve overall QoL and return to PLOF.     Personal Factors and Comorbidities Comorbidity 3+;Past/Current  Experience;Time since onset of injury/illness/exacerbation    Comorbidities PMHx includes: Bilateral LBP with right sided sciatica, Lumbosacral spondylosis myelopathy, closed compression Fx of L1. Has chronic insomnia. osteoarthritis, Panic anxiety syndrome (taking medication)    Examination-Activity Limitations Bend;Carry;Locomotion Level;Sit;Squat;Stairs;Stand;Transfers    Examination-Participation Restrictions Cleaning;Community Activity;Driving;Laundry;Meal Prep;Occupation;Shop;Volunteer;Yard Work    Stability/Clinical Decision Making Stable/Uncomplicated    Rehab Potential Good    PT Frequency 2x / week    PT Duration 12 weeks    PT Treatment/Interventions Cryotherapy;Electrical Stimulation;Moist Heat;Traction;Gait training;Stair training;Functional mobility training;Therapeutic activities;Therapeutic exercise;Balance training;Neuromuscular re-education;Patient/family education;Manual techniques;Passive range of motion;Dry needling;Energy conservation    PT Next Visit Plan  Progress core stabilization, Manual therapy for low back and LE ROM, instruct in balance HEP, continue plan       Consulted and Agree with Plan of Care Patient              1:33 PM, 03/05/22 Ricard Dillon PT, DPT  Physical Therapist - [facility]  Outpatient Physical Therapy- [place] MAIN [facility] SERVICES [address] [place], [place], [address] Phone: [phone]   Fax:  [phone]

## 2022-03-08 ENCOUNTER — Ambulatory Visit: Payer: Medicare Other

## 2022-03-08 ENCOUNTER — Encounter: Payer: Medicare HMO | Admitting: Speech Pathology

## 2022-03-08 DIAGNOSIS — R2681 Unsteadiness on feet: Secondary | ICD-10-CM

## 2022-03-08 DIAGNOSIS — R269 Unspecified abnormalities of gait and mobility: Secondary | ICD-10-CM

## 2022-03-08 DIAGNOSIS — M6281 Muscle weakness (generalized): Secondary | ICD-10-CM

## 2022-03-08 DIAGNOSIS — Z8673 Personal history of transient ischemic attack (TIA), and cerebral infarction without residual deficits: Secondary | ICD-10-CM

## 2022-03-08 DIAGNOSIS — R262 Difficulty in walking, not elsewhere classified: Secondary | ICD-10-CM | POA: Diagnosis not present

## 2022-03-08 DIAGNOSIS — R278 Other lack of coordination: Secondary | ICD-10-CM

## 2022-03-08 DIAGNOSIS — G8929 Other chronic pain: Secondary | ICD-10-CM

## 2022-03-08 NOTE — Therapy (Signed)
[place] [facility] [address] [place], [place], [address] Phone: [phone]   Fax:  [phone]  Patient Details  Name: David Reeves MRN: [id number] Date of Birth: 05-26-1970 Referring Provider:  [name], PA-C  Encounter Date: 03/08/2022   OUTPATIENT PHYSICAL THERAPY TREATMENT NOTE      Patient Name: David Reeves MRN: [id number] DOB:Sep 03, 1970, 51 y.o., male Today's Date: 03/08/2022  PCP: [name], PA-C REFERRING PROVIDER: [name], PA-C   PT End of Session - 03/08/22 0852     Visit Number 61    Number of Visits 52    Date for PT Re-Evaluation 04/26/22    Authorization Type [employer]    Authorization Time Period 02/01/22-04/26/22    Progress Note Due on Visit 45    PT Start Time 0845    PT Stop Time 0930    PT Time Calculation (min) 45 min    Equipment Utilized During Treatment Gait belt    Activity Tolerance Patient tolerated treatment well;No increased pain    Behavior During Therapy [facility] for tasks assessed/performed                 Past Medical History:  Diagnosis Date   Back pain 04/22/2012   Bone spur    Bulging disc 04/22/2012   Degenerative disc disease    Osteoarthritis    Panic anxiety syndrome    Stroke ([place]) 10/2020   Taking multiple medications for chronic disease    Past Surgical History:  Procedure Laterality Date   CYSTECTOMY     head   HERNIA REPAIR Left 2006,2014   [facility]   TOE SURGERY Right 2007   Patient Active Problem List   Diagnosis Date Noted   Alterations of sensations following cerebrovascular accident 03/08/2021   Aphasia as late effect of cerebrovascular accident (CVA) 03/08/2021   Muscle spasm 02/22/2021   Acute non-recurrent frontal sinusitis 02/22/2021   Chronic pain syndrome 01/23/2021   Primary hypertension 01/23/2021   Nerve pain 01/23/2021   Erectile dysfunction due to diseases classified elsewhere 01/23/2021    Cognitive dysfunction 01/23/2021   Scrotal edema 01/23/2021   Elevated LDL cholesterol level 01/23/2021   History of stroke with residual deficit 01/23/2021   Primary insomnia 01/23/2021   Mouth pain 01/23/2021   Laceration of right hand without foreign body    Closed compression fracture of L1 vertebra ([place]) 05/24/2016   Lumbar stenosis with neurogenic claudication 11/01/2015   Chronic bilateral low back pain with right-sided sciatica 11/08/2014   Hx of hemorrhoids 11/08/2014   AAA (abdominal aortic aneurysm) ([place]) 09/22/2014   Chronic pain associated with significant psychosocial dysfunction 09/22/2014   Panic disorder 09/22/2014   AB (asthmatic bronchitis) 08/17/2014   Anxiety 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 08/17/2014   Backache 08/17/2014   Lumbosacral spondylosis without myelopathy 08/17/2014   Disorder of male genital organs 08/17/2014   Brash 08/17/2014   Low back pain 08/17/2014   Tendon nodule 08/17/2014   Episodic paroxysmal anxiety disorder 08/17/2014   Hernia, inguinal, right 08/17/2014   Fast heart beat 08/17/2014   Illness 08/17/2014   Inguinal hernia 10/14/2012    REFERRING DIAG: History of stroke with residual deficit, chronic bilateral low back pain with right-sided sciatica, Lumbosacral spondylosis without myelopathy THERAPY DIAG:  Muscle weakness (generalized)  Other lack of coordination  H/O ischemic left MCA stroke  Unsteadiness on feet  Difficulty in walking, not elsewhere classified  Abnormality of gait and mobility  Chronic  bilateral low back pain without sciatica  Rationale for Evaluation and Treatment Rehabilitation  PERTINENT HISTORY: Pt. is a 51 y.o. who was diagnosed with a CVA on July 21st, 2022. Pt. completed several weeks of inpatient rehab at [facility]. After returning to home. He was discharged in late August/early September and recieved home health PT. Pt. sustained a fall in december of 2022, and was admitted to the hospital with  COVID-19, and Chronic back pain. He reports chronic back pain syndrome since 2012. Since the most recent discharge, Pt. has been residing with the ex-wife until the Pt. is ready to return to independent living. He did recieve 2 weeks of home health after discharge in December 2022. He is now being referred to outpatient PT to address weakness from stroke and improve fine motor movement. He reports rarely getting numbness/tingling in RLE. PMHx includes: Bilateral LBP with right sided sciatica, Lumbosacral spondylosis myelopathy, closed compression Fx of L1. Has chronic insomnia. osteoarthritis, Panic anxiety syndrome (taking medication) Pt. enjoys cooking, and riding motorcycles. Patient is going to [facility] spine center for Epidural spinal injections on 06/07/21;  PRECAUTIONS: Fall, Spinal Brace wears daily, approximately 25%, when exercising; semi-rigid lumbar brace  SUBJECTIVE:Pt reports some short term relief with lidocaine injections earlier this week and continues to report 1/10 LBP.    PAIN:  Are you having pain? Yes: NPRS scale: 1/10 Pain location: bilat low back Pain description: dull ache; always constant,  Aggravating factors: worse with sitting,  Relieving factors: heat helps temporarily, pain pills/pain patch   TODAY'S TREATMENT 03/08/22  Heat applied to low back while he performs the following (skin checked is WNL prior to application and upon removal of heat, no adverse reaction):  On mat table- LTRs 20x each way Supine hip abd with BTB and TrA contraction 2 sets of 10 reps Bridges 10x- VC for 3 sec hold Single leg glute bridge 10x each LE. Rates medium.     NMR: Standing initially on airex beam (feet wide) - hold 30 sec - no significant unsteadiness so switched to feet narrowed and patient stood for 30 sec with mild unsteadiness. Then performed standing at Dry erase board using magnetic letter- and positioning letters into alphabetical order- using both left UE and some Right UE  in feet narrowed position and later staggered position)   Reports no increase in back pain with interventions  PATIENT EDUCATION:  Education details: Pt educated throughout session about proper posture and technique with exercises. Improved exercise technique, movement at target joints, use of target muscles after min to mod verbal, visual, tactile cues. Goals, plan.  Person educated: Patient Education method: Explanation, Demonstration, Tactile cues, and Verbal cues Education comprehension: verbalized understanding, returned demonstration, verbal cues required, tactile cues required, and needs further education   HOME EXERCISE PROGRAM: No updates on this date pt to continue HEP as previously given     PT Short Term Goals -       PT SHORT TERM GOAL #1   Title Patient will be adherent to HEP at least 3x a week to improve functional strength and balance for better safety at home.    Baseline 4/4: doing them 1-2x a week; 08/20/2021=Patient reports performing his low back stretching and no questions currently HEP. 10/24/21: doing HEP 4x a week   Time 4    Period Weeks    Status Achieved    Target Date 08/21/21      PT SHORT TERM GOAL #2   Title Patient (< 60 years  old) will complete five times sit to stand test in < 15 seconds indicating an increased LE strength and improved balance.    Baseline 4/4: 17.85 sec with arms across chest; 08/20/2021= 17.6 sec , 7/5: 12.85 sec   Time 4    Period Weeks    Status Achieved;    Target Date 10/01/21              PT Long Term Goals -  TARGET DATE FOR GOALS 04/26/2022      PT LONG TERM GOAL #1   Title Patient will increase six minute walk test distance to >1000 for progression to community ambulator and improve gait ability    Baseline 4/4: 1115 feet , 7/5: 1080 feet;    Time 4    Period Weeks    Status Achieved    Target Date 08/21/21      PT LONG TERM GOAL #2   Title Patient will ascend/descend 8 stairs without rail assist independently  without loss of balance to improve ability to get in/out of home.    Baseline 4/4: requires 1 rail assist; 08/20/2021- Patient demonstrated steps without railing using reciprocal steps today. 7/5: ascend/descend 6 steps reciprocally without rail, but required CGA for safety when descending; 7/25: Able to complete without UE support and good balance going up steps, unsafe descending steps without UE support. Required min assist and UE support to prevent LOB descending.; 8/15: requires UUE support to descend and close CGA due to; unsteadiness; 9/26: ascend without UE support, recip pattern; descend with UUE support, recip stepping, but still doesn't feel steady yet with descending; 10/13 able to demo recip pattern ascending hands free, required instance of UE support with descending, recip pattern due to heel getting caught under step; 11/14: deferred   Time 12*corrected   Period Weeks    Status Partially met   Target Date 02/05/2022      PT LONG TERM GOAL #3   Title Patient will increase BLE gross strength to 4+/5 as to improve functional strength for independent gait, increased standing tolerance and increased ADL ability.    Baseline 4/4: not formally assessed; 08/20/2021= 4+/5 with Right hip flex/knee ext/flex; Right hip abd= 4/5, 7/5: 4+/5 hip    Time 12    Period Weeks    Status Achieved;    Target Date 11/12/21      PT LONG TERM GOAL #4   Title Patient will improve FOTO score to >50% to indicate improved functional mobility with less pain with ADLs.    Baseline 4/4: 35%; 08/20/2021= 42% 7/5: 47%; 7:25:recently addressed; 11/30/21: 35; 9/26: 54%    Time 12    Period Weeks    Status MET   Target Date      PT LONG TERM GOAL #5   Title Patient will report a worst pain of 4/10 in low back over last week to indicate improved tolerance with ADLs.    Baseline 4/4: 7/10; 08/20/2021= 4/10 , 7/5: worst 7/10 in low back; 7/25: 6/10 ; 8/15: pt reports 6/10; 9/26: 4/10   Time 12    Period Weeks    Status  Achieved    Target Date 02/05/2022      Additional Long Term Goals   Additional Long Term Goals Yes      PT LONG TERM GOAL #6   Title Pt will increase 10MWT by at least 0.13 m/s in order to demonstrate clinically significant improvement in community ambulation    Baseline 08/20/2021=0.94 m/s 7/5:  1.5 m/s   Time 12    Period Weeks    Status Achieved;    Target Date 11/12/21      PT LONG TERM GOAL #7   Title Patient will demonstrate improved static standing balance as seen by ability to single leg stance on right LE > 12 sec consistently for optimal balance on level and unlevel surfaces.    Baseline 08/20/2021= 4 sec SLS on right , 7/5: 4-5 sec inconsistent; 7/25 able to achieve 15 sec after multiple attempts of 4-5 sec holds; 8/15: LLE 15 sec, RLE 8 sec; 9/26: LLE 30 sec, RLE 17 seconds   Time 12    Period Weeks    Status MET   Target Date 02/05/2022    PT LONG TERM GOAL #8  Title Pt will report ability to ambulate for at least 15 minutes without an increase in his LBP in order to improve community participation and ease with ADLs  Baseline 9/26: increase in pain at 15 min mark; 10/13: reports about 20 minutes.   Time 3   Period Weeks   Status MET  Target Date 02/05/2022        PT LONG TERM GOAL #9  Title Pt will report ability to ambulate for at least 45 minutes without an increase in his LBP in order to improve community participation and ease with ADLs  Baseline 9/26: increase in pain at 15 min mark; 10/13: reports about 20 minutes. 11/14: deferred  Time 3   Period Weeks   Status NEW  Target Date        Plan -     Clinical Impression Statement Patient continues to report lower levels of pain so treatment focused more on core strengthening and later balance training. Patient performed well with all core exercises without any report of back pain and later challenged with dynamic standing balance - improving with practice and even using his right UE some to position letters on  the board without LOB. The pt will continue to benefit from skilled therapy to address remaining deficits in order to improve overall QoL and return to PLOF.     Personal Factors and Comorbidities Comorbidity 3+;Past/Current Experience;Time since onset of injury/illness/exacerbation    Comorbidities PMHx includes: Bilateral LBP with right sided sciatica, Lumbosacral spondylosis myelopathy, closed compression Fx of L1. Has chronic insomnia. osteoarthritis, Panic anxiety syndrome (taking medication)    Examination-Activity Limitations Bend;Carry;Locomotion Level;Sit;Squat;Stairs;Stand;Transfers    Examination-Participation Restrictions Cleaning;Community Activity;Driving;Laundry;Meal Prep;Occupation;Shop;Volunteer;Yard Work    Stability/Clinical Decision Making Stable/Uncomplicated    Rehab Potential Good    PT Frequency 2x / week    PT Duration 12 weeks    PT Treatment/Interventions Cryotherapy;Electrical Stimulation;Moist Heat;Traction;Gait training;Stair training;Functional mobility training;Therapeutic activities;Therapeutic exercise;Balance training;Neuromuscular re-education;Patient/family education;Manual techniques;Passive range of motion;Dry needling;Energy conservation    PT Next Visit Plan  Progress core stabilization, Manual therapy for low back and LE ROM, instruct in balance HEP, continue plan       Consulted and Agree with Plan of Care Patient              11:23 AM, 03/08/22 Ollen Bowl, PT  Physical Therapist - [facility]  Outpatient Physical Therapy- [place] [address] [place], [place], [address] Phone: [phone]   Fax:  [phone]

## 2022-03-09 NOTE — Therapy (Signed)
OUTPATIENT OCCUPATIONAL THERAPY NEURO  TREATMENT NOTE   Patient Name: David Reeves MRN: [id number] DOB:[birth year], 51 y.o., male Today's Date: 03/09/2022  PCP: [name], PA-C REFERRING PROVIDER: [name], PA-C   OT End of Session - 03/09/22 2107     Visit Number 72    Number of Visits 43    Date for OT Re-Evaluation 04/16/22    Authorization Type Progress reporting period starting 03/01/22    OT Start Time 0930    OT Stop Time 1015    OT Time Calculation (min) 45 min    Activity Tolerance Patient tolerated treatment well    Behavior During Therapy [facility] for tasks assessed/performed               Past Medical History:  Diagnosis Date   Back pain 04/22/2012   Bone spur    Bulging disc 04/22/2012   Degenerative disc disease    Osteoarthritis    Panic anxiety syndrome    Stroke ([place]) 10/2020   Taking multiple medications for chronic disease    Past Surgical History:  Procedure Laterality Date   CYSTECTOMY     head   HERNIA REPAIR Left 2006,2014   [facility]   TOE SURGERY Right 2007   Patient Active Problem List   Diagnosis Date Noted   Alterations of sensations following cerebrovascular accident 03/08/2021   Aphasia as late effect of cerebrovascular accident (CVA) 03/08/2021   Muscle spasm 02/22/2021   Acute non-recurrent frontal sinusitis 02/22/2021   Chronic pain syndrome 01/23/2021   Primary hypertension 01/23/2021   Nerve pain 01/23/2021   Erectile dysfunction due to diseases classified elsewhere 01/23/2021   Cognitive dysfunction 01/23/2021   Scrotal edema 01/23/2021   Elevated LDL cholesterol level 01/23/2021   History of stroke with residual deficit 01/23/2021   Primary insomnia 01/23/2021   Mouth pain 01/23/2021   Laceration of right hand without foreign body    Closed compression fracture of L1 vertebra ([place]) 05/24/2016   Lumbar stenosis with neurogenic claudication 11/01/2015   Chronic bilateral low back pain with right-sided  sciatica 11/08/2014   Hx of hemorrhoids 11/08/2014   AAA (abdominal aortic aneurysm) ([place]) 09/22/2014   Chronic pain associated with significant psychosocial dysfunction 09/22/2014   Panic disorder 09/22/2014   AB (asthmatic bronchitis) 08/17/2014   Anxiety 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 08/17/2014   Backache 08/17/2014   Lumbosacral spondylosis without myelopathy 08/17/2014   Disorder of male genital organs 08/17/2014   Brash 08/17/2014   Low back pain 08/17/2014   Tendon nodule 08/17/2014   Episodic paroxysmal anxiety disorder 08/17/2014   Hernia, inguinal, right 08/17/2014   Fast heart beat 08/17/2014   Illness 08/17/2014   Inguinal hernia 10/14/2012    ONSET DATE: 11/09/2020  REFERRING DIAG: CVA  THERAPY DIAG:  Muscle weakness (generalized)  Other lack of coordination  H/O ischemic left MCA stroke  Rationale for Evaluation and Treatment Rehabilitation  PERTINENT HISTORY: Pt. is a 51 y.o. who was diagnosed with a CVA on July 21st, 2022. Pt. completed several weeks of  inpatient rehab at [facility]. After returning to home, Pt. sustained a fall in december of 2022, and was admitted to the hospital with COVID-19, and back pain from the fall. Since the most recent discharge, Pt. has been residing with the ex-wife until the Pt. is ready to return to independent living. Pt. PMHx includes: Bilateral LBP with right sided sciatica, Lumbosacral spondylosis myelopathy, closed compression Fx of L1. Pt. enjoys cooking, and riding motorcycles.  PRECAUTIONS: Fall  SUBJECTIVE: Pt reports doing well today.   PAIN:  Are you having pain? Yes: NPRS scale: 1/10 Pain location: low back Pain description: aching Aggravating factors: prolonged sitting or lying down Relieving factors: rest, heat, walking  [facility] OT Assessment        12/11/21 01/22/22 03/01/22             Observation/Other Assessments      Focus on Therapeutic Outcomes (FOTO)   46 50 49             Coordination       Right 9 Hole Peg Test unable  unable unable    Left 9 Hole Peg Test 30 sec  30 sec NT             Hand Function      Right Hand Grip (lbs) 35 (3rd setting) 30 lb (3rd);  41 lb (4th setting),  44 lbs (4th setting)    Right Hand Lateral Pinch 9 lbs   slipping 12 lbs (thumb slips) 16 lbs (multiple trials d/t thumb slips)    Right Hand 3 Point Pinch   unable to position thumb to produce 3 pt pinch Unable to position thumb to produce 3 point pinch unable    Left Hand Grip (lbs) 116       Left Hand Lateral Pinch 29 lbs       Left 3 point pinch 27 lbs         OBJECTIVE:  TODAY'S TREATMENT:  Therapeutic Exercise: Participated in place and hold for thumb radial abd, resisted lateral pinch pushing into foam dowel, active assisted palmar abd x3 sets 7-10 reps each.  Performed active digit abd and active assisted add, working to strengthen intrinsics x3 sets 7-10 each.  Facilitated 3 point pinch, pushing into foam dowel, with OT providing set up and stabilization of digits in 3 point pinch pattern while pt pinched into dowel for 3 sets 10 reps each.  Pt requires rest breaks between each set for above noted exercises.     Neuro re-ed: Instructed pt in various RUE WB positions on table top and mat table (quadruped and prone on mat) for tone reduction through forearm, wrist, and hand.  Initial min A to position RUE to maximize WB through the RUE in each position.  Facilitated 3 point pinch prehension patterns working to grasp jumbo pegs to place and remove from pegboard.  Required vc for pinch modulation for grasping pegs to place/remove without dropping.    PATIENT EDUCATION: Education details: HEP progression, grasp/release activities; wrist flex against gravity, lateral and 3 point pinch with foam dowel Person educated: Patient Education method: Explanation Education comprehension: verbalized and demonstrated understanding.      OT Short Term Goals -      OT SHORT TERM GOAL #1   Title Pt. wil  demonstrtae independence with HEPs    Baseline Eval: Pt. currently does not have one, 10th visit:  changes in HEP ongoing; 20th: instructed in putty exercises, changes ongoing; 40th: continual updates to HEP as pt progresses; 50th: continual updates; 01/22/22: continual updates; 03/01/22: continual updates   Time 6    Period Weeks    Status On-going    Target Date 03/05/22             OT Long Term Goals -       OT LONG TERM GOAL #1   Title Pt. will improve RUE strength by 2 mm grades to assist with  ADLs, and IADLs,    Baseline Eval: Right shoulder flexion, abduction 3/5, elbow flexion, extension wrist extension 3+/5, 10th visit: improving with RUE strength by not yet met goal; 20th visit: R shd flex/abd 4-, elbow flex/ext 4+, wrist flex 4-, wrist ext 4+; 40:  R shd flex/abd 4,  elbow flex/ext 4+, wrist flex 4, wrist ext 4+; 50th visit 8/22: R shoulder flex/abd 4/5, elbow flex/ext 5/5, wrist flex 4, wrist ext 4+; 01/22/22: shoulder flex/abd 4/5, elbow flex/ext 5/5, wrist flex 4, wrist ext 4+; 03/01/22: same as 01/22/22   Time 12    Period Weeks    Status On-going    Target Date 04/16/22      OT LONG TERM GOAL #2   Title Pt. will improve right grip by 10 lbs to prepare for firmly holding objects for IADLs.    Baseline Eval: R: 38#, L: 124# (In 3rd dynamometer slot); R 35# in 4th slot, 28# in 3rd slot, 40th: 47 in 4th slot; 50th: 12/11/21: 46# on 3rd slot; 01/22/22: 30# on 3rd slot; 03/01/22: 44# 4th slot   Time 12    Period Weeks    Status On-going    Target Date 04/16/22      OT LONG TERM GOAL #3   Title Pt. will improve right lateral pinch strength by 5# to assist with cutting food    Baseline Eval: R: 17, L: 25; 20th: R: 18; 40th: 15# fingers slipping on meter, 50th: 9# fingers slipping; 01/22/22: 12# fingers slipping; 03/01/22: 16#   Time 12    Period Weeks    Status On-going    Target Date 04/16/22      OT LONG TERM GOAL #4   Title Pt. will improve Right 3pt pinch by 2# to be  able to hold/open items for cooking    Baseline Eval: R: Pt. unable to engage thumb L: 29#; 20th: R unable; 40th:  unable, fingers slipping; 50th:  unable, fingers slipping; 01/22/22: fingers slipping but pt is now able to grasp a ball peg with 3 point pinch pattern; 03/01/22: same as 10/3 in addition to being able to pick up ball peg from pegboard then place peg back into pegboard with a 3 point pinch pattern.   Time 12    Period Weeks    Status On-going    Target Date 04/16/22      OT LONG TERM GOAL #5   Title Pt. will improve right hand [facility] skills to be able to independently manipulate buttons, and zippers.    Baseline Eval: Pt. has difficulty managing buttons,a nd zippers. 10th visit:  improving; 20th: unable to engage R hand effectively, performs 1 handed with L; 50th: performs L handed only; 01/22/22: pt can use R hand as a stabilizer with clothing fasteners with mod vc; 03/01/22: same as 01/22/22   Time 12    Period Weeks    Status On-going    Target Date 04/16/22      OT LONG TERM GOAL #6   Title Pt. will independently write his name    Baseline Eval: Pt. is unable to hold a pen; 20th: pt can grip PenAgain with R hand and can make marks on paper with difficulty, but not yet writing name; 40th:  continues to demonstrate difficulty with grasp of pen; 50th 12/11/21: With assist to set up hand on pen again, pt was able to write first name and initial of last name in 1 1/4" tall letters with fair legibility (pt wants to defer  this goal, states this is low on his list of priorities for now)   Time 12    Period Weeks    Status Defer   Target Date 04/16/22      OT LONG TERM GOAL #7   Title Pt. will improve FOTO score by 2 points to reflect funational improvement    Baseline Eval: FOTO score 46 with TR score 52; 20th: 45; 40th: 23;  50th: 46; 01/22/22: 50; 03/01/22: 49   Time 12    Period Weeks    Status On-going    Target Date 04/16/22             Plan -    Clinical Impression  Statement Pt continues to focus on intrinsic strengthening and pinch prehension patterns with lateral and 3 point pinch.  Reviewed WB positions to reduce tone throughout the RUE. Pt favored prone on mat, propped on elbows for WB through elbows, forearms, and hands.  Pt will continue to work towards neuro re-ed of right hand with emphasis on thumb movements to integrate into functional grasping and manipulation skills.    OT Occupational Profile and History Detailed Assessment- Review of Records and additional review of physical, cognitive, psychosocial history related to current functional performance    Occupational performance deficits (Please refer to evaluation for details): ADL's;IADL's;Education    Rehab Potential Good    Clinical Decision Making Several treatment options, min-mod task modification necessary    Comorbidities Affecting Occupational Performance: May have comorbidities impacting occupational performance    Modification or Assistance to Complete Evaluation  Min-Moderate modification of tasks or assist with assess necessary to complete eval    OT Frequency 2x / week    OT Duration 12 weeks    OT Treatment/Interventions Self-care/ADL training;DME and/or AE instruction;Therapeutic exercise;Ultrasound;Neuromuscular education;Therapeutic activities;Energy conservation;Moist Heat;Patient/family education;Splinting;Functional Mobility Training;Paraffin    Plan Neuro re-ed and therapeutic exercise    Consulted and Agree with Plan of Care Patient             Leta Speller, MS, OTR/L   Darleene Cleaver, OT 03/09/2022, 9:11 PM

## 2022-03-12 ENCOUNTER — Ambulatory Visit: Payer: Medicare Other

## 2022-03-12 ENCOUNTER — Encounter: Payer: Medicare HMO | Admitting: Speech Pathology

## 2022-03-12 DIAGNOSIS — M6281 Muscle weakness (generalized): Secondary | ICD-10-CM

## 2022-03-12 DIAGNOSIS — R262 Difficulty in walking, not elsewhere classified: Secondary | ICD-10-CM | POA: Diagnosis not present

## 2022-03-12 DIAGNOSIS — G8929 Other chronic pain: Secondary | ICD-10-CM

## 2022-03-12 DIAGNOSIS — R278 Other lack of coordination: Secondary | ICD-10-CM

## 2022-03-12 DIAGNOSIS — R2681 Unsteadiness on feet: Secondary | ICD-10-CM

## 2022-03-12 DIAGNOSIS — Z8673 Personal history of transient ischemic attack (TIA), and cerebral infarction without residual deficits: Secondary | ICD-10-CM

## 2022-03-12 NOTE — Therapy (Signed)
[place] [facility] SERVICES [address] [place], [place], [address] Phone: [phone]   Fax:  [phone]  Patient Details  Name: David Reeves MRN: [id number] Date of Birth: Jun 04, 1970 Referring Provider:  [name], PA-C  Encounter Date: 03/12/2022   OUTPATIENT PHYSICAL THERAPY TREATMENT NOTE      Patient Name: David Reeves MRN: [id number] DOB:[birth year], 51 y.o., male Today's Date: 03/12/2022  PCP: [name], PA-C REFERRING PROVIDER: [name], PA-C   PT End of Session - 03/12/22 1634     Visit Number 62    Number of Visits 72    Date for PT Re-Evaluation 04/26/22    Authorization Type [employer]    Authorization Time Period 02/01/22-04/26/22    Progress Note Due on Visit 1    PT Start Time 1016    PT Stop Time 1100    PT Time Calculation (min) 44 min    Equipment Utilized During Treatment Gait belt    Activity Tolerance Patient tolerated treatment well;No increased pain    Behavior During Therapy [facility] for tasks assessed/performed                  Past Medical History:  Diagnosis Date   Back pain 04/22/2012   Bone spur    Bulging disc 04/22/2012   Degenerative disc disease    Osteoarthritis    Panic anxiety syndrome    Stroke ([place]) 10/2020   Taking multiple medications for chronic disease    Past Surgical History:  Procedure Laterality Date   CYSTECTOMY     head   HERNIA REPAIR Left 2006,2014   [facility]   TOE SURGERY Right 2007   Patient Active Problem List   Diagnosis Date Noted   Alterations of sensations following cerebrovascular accident 03/08/2021   Aphasia as late effect of cerebrovascular accident (CVA) 03/08/2021   Muscle spasm 02/22/2021   Acute non-recurrent frontal sinusitis 02/22/2021   Chronic pain syndrome 01/23/2021   Primary hypertension 01/23/2021   Nerve pain 01/23/2021   Erectile dysfunction due to diseases classified elsewhere 01/23/2021    Cognitive dysfunction 01/23/2021   Scrotal edema 01/23/2021   Elevated LDL cholesterol level 01/23/2021   History of stroke with residual deficit 01/23/2021   Primary insomnia 01/23/2021   Mouth pain 01/23/2021   Laceration of right hand without foreign body    Closed compression fracture of L1 vertebra ([place]) 05/24/2016   Lumbar stenosis with neurogenic claudication 11/01/2015   Chronic bilateral low back pain with right-sided sciatica 11/08/2014   Hx of hemorrhoids 11/08/2014   AAA (abdominal aortic aneurysm) ([place]) 09/22/2014   Chronic pain associated with significant psychosocial dysfunction 09/22/2014   Panic disorder 09/22/2014   AB (asthmatic bronchitis) 08/17/2014   Anxiety 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 08/17/2014   Backache 08/17/2014   Lumbosacral spondylosis without myelopathy 08/17/2014   Disorder of male genital organs 08/17/2014   Brash 08/17/2014   Low back pain 08/17/2014   Tendon nodule 08/17/2014   Episodic paroxysmal anxiety disorder 08/17/2014   Hernia, inguinal, right 08/17/2014   Fast heart beat 08/17/2014   Illness 08/17/2014   Inguinal hernia 10/14/2012    REFERRING DIAG: History of stroke with residual deficit, chronic bilateral low back pain with right-sided sciatica, Lumbosacral spondylosis without myelopathy THERAPY DIAG:  Unsteadiness on feet  Muscle weakness (generalized)  Chronic bilateral low back pain without sciatica  Other lack of coordination  Rationale for Evaluation and Treatment Rehabilitation  PERTINENT HISTORY: Pt. is  a 51 y.o. who was diagnosed with a CVA on July 21st, 2022. Pt. completed several weeks of inpatient rehab at [facility]. After returning to home. He was discharged in late August/early September and recieved home health PT. Pt. sustained a fall in december of 2022, and was admitted to the hospital with COVID-19, and Chronic back pain. He reports chronic back pain syndrome since 2012. Since the most recent discharge,  Pt. has been residing with the ex-wife until the Pt. is ready to return to independent living. He did recieve 2 weeks of home health after discharge in December 2022. He is now being referred to outpatient PT to address weakness from stroke and improve fine motor movement. He reports rarely getting numbness/tingling in RLE. PMHx includes: Bilateral LBP with right sided sciatica, Lumbosacral spondylosis myelopathy, closed compression Fx of L1. Has chronic insomnia. osteoarthritis, Panic anxiety syndrome (taking medication) Pt. enjoys cooking, and riding motorcycles. Patient is going to [facility] spine center for Epidural spinal injections on 06/07/21;  PRECAUTIONS: Fall, Spinal Brace wears daily, approximately 25%, when exercising; semi-rigid lumbar brace  SUBJECTIVE:Pt continues to report 1/10 LBP. Pt reports no stumbles/falls. Reports he slept about 2.5 hours last night.   PAIN:  Are you having pain? Yes: NPRS scale: 1/10 Pain location: bilat low back Pain description: dull ache; always constant,  Aggravating factors: worse with sitting,  Relieving factors: heat helps temporarily, pain pills/pain patch   TODAY'S TREATMENT 03/12/22  TherEx Heat applied to low back while he performs the following (skin checked is WNL prior to application and upon removal of heat, no adverse reaction):  On mat table- LTRs 10x each way Bridges 10x- VC for 3 sec hold Single leg glute bridge 10x each LE. Rates more difficult compared to last time.  Nustep with R hand brace/strap donned lvls 1-6, cuing for speed, technique (seat 12). No back pain with intervention. Completes total of 10 minutes.   TherAct Goal retesting completed on this date. Please refer to goal section for details.  Stair descending practice with 6" step, main focus stepping down leading with LLE x multiple reps   NMR:  SLB practice focus on RLE multiple bouts of 15-20 sec   Reports no increase in back pain with interventions  PATIENT  EDUCATION:  Education details: Pt educated throughout session about proper posture and technique with exercises. Improved exercise technique, movement at target joints, use of target muscles after min to mod verbal, visual, tactile cues. Goals reassessment.  Person educated: Patient Education method: Explanation, Demonstration, Tactile cues, and Verbal cues Education comprehension: verbalized understanding, returned demonstration, verbal cues required, tactile cues required, and needs further education   HOME EXERCISE PROGRAM: No updates on this date pt to continue HEP as previously given     PT Short Term Goals -       PT SHORT TERM GOAL #1   Title Patient will be adherent to HEP at least 3x a week to improve functional strength and balance for better safety at home.    Baseline 4/4: doing them 1-2x a week; 08/20/2021=Patient reports performing his low back stretching and no questions currently HEP. 10/24/21: doing HEP 4x a week   Time 4    Period Weeks    Status Achieved    Target Date 08/21/21      PT SHORT TERM GOAL #2   Title Patient (< 76 years old) will complete five times sit to stand test in < 15 seconds indicating an increased LE strength and improved  balance.    Baseline 4/4: 17.85 sec with arms across chest; 08/20/2021= 17.6 sec , 7/5: 12.85 sec   Time 4    Period Weeks    Status Achieved;    Target Date 10/01/21              PT Long Term Goals -  TARGET DATE FOR GOALS 04/26/2022      PT LONG TERM GOAL #1   Title Patient will increase six minute walk test distance to >1000 for progression to community ambulator and improve gait ability    Baseline 4/4: 1115 feet , 7/5: 1080 feet;    Time 4    Period Weeks    Status Achieved    Target Date 08/21/21      PT LONG TERM GOAL #2   Title Patient will ascend/descend 8 stairs without rail assist independently without loss of balance to improve ability to get in/out of home.    Baseline 4/4: requires 1 rail assist;  08/20/2021- Patient demonstrated steps without railing using reciprocal steps today. 7/5: ascend/descend 6 steps reciprocally without rail, but required CGA for safety when descending; 7/25: Able to complete without UE support and good balance going up steps, unsafe descending steps without UE support. Required min assist and UE support to prevent LOB descending.; 8/15: requires UUE support to descend and close CGA due to; unsteadiness; 9/26: ascend without UE support, recip pattern; descend with UUE support, recip stepping, but still doesn't feel steady yet with descending; 10/13 able to demo recip pattern ascending hands free, required instance of UE support with descending, recip pattern due to heel getting caught under step; 11/14: deferred; 11/21: mod assist with descending due to unsteadiness, able to complete without UE support ascending   Time 12*corrected   Period Weeks    Status Partially met   Target Date      PT LONG TERM GOAL #3   Title Patient will increase BLE gross strength to 4+/5 as to improve functional strength for independent gait, increased standing tolerance and increased ADL ability.    Baseline 4/4: not formally assessed; 08/20/2021= 4+/5 with Right hip flex/knee ext/flex; Right hip abd= 4/5, 7/5: 4+/5 hip    Time 12    Period Weeks    Status Achieved;    Target Date 11/12/21      PT LONG TERM GOAL #4   Title Patient will improve FOTO score to >50% to indicate improved functional mobility with less pain with ADLs.    Baseline 4/4: 35%; 08/20/2021= 42% 7/5: 47%; 7:25:recently addressed; 11/30/21: 35; 9/26: 54%    Time 12    Period Weeks    Status MET   Target Date      PT LONG TERM GOAL #5   Title Patient will report a worst pain of 4/10 in low back over last week to indicate improved tolerance with ADLs.    Baseline 4/4: 7/10; 08/20/2021= 4/10 , 7/5: worst 7/10 in low back; 7/25: 6/10 ; 8/15: pt reports 6/10; 9/26: 4/10   Time 12    Period Weeks    Status Achieved     Target Date 02/05/2022      Additional Long Term Goals   Additional Long Term Goals Yes      PT LONG TERM GOAL #6   Title Pt will increase 10MWT by at least 0.13 m/s in order to demonstrate clinically significant improvement in community ambulation    Baseline 08/20/2021=0.94 m/s 7/5: 1.5 m/s   Time 12  Period Weeks    Status Achieved;    Target Date 11/12/21      PT LONG TERM GOAL #7   Title Patient will demonstrate improved static standing balance as seen by ability to single leg stance on right LE > 12 sec consistently for optimal balance on level and unlevel surfaces.    Baseline 08/20/2021= 4 sec SLS on right , 7/5: 4-5 sec inconsistent; 7/25 able to achieve 15 sec after multiple attempts of 4-5 sec holds; 8/15: LLE 15 sec, RLE 8 sec; 9/26: LLE 30 sec, RLE 17 seconds   Time 12    Period Weeks    Status MET   Target Date 02/05/2022    PT LONG TERM GOAL #8  Title Pt will report ability to ambulate for at least 15 minutes without an increase in his LBP in order to improve community participation and ease with ADLs  Baseline 9/26: increase in pain at 15 min mark; 10/13: reports about 20 minutes.   Time 3   Period Weeks   Status MET  Target Date 02/05/2022        PT LONG TERM GOAL #9  Title Pt will report ability to ambulate for at least 45 minutes without an increase in his LBP in order to improve community participation and ease with ADLs  Baseline 9/26: increase in pain at 15 min mark; 10/13: reports about 20 minutes. 11/14: deferred; 11/21: Unable to complete 45 min gait without pain, but does report this has improved. Thinks he can do 30 min before pain increase.  Time 3   Period Weeks   Status ONGOING  Target Date        Plan -     Clinical Impression Statement Further goal testing completed. Pt's primary remaining impairment is balance, where he requires mod assist with descending stairs due to unsteadiness. He reports improvement in ambulation time prior to increase  in LBP. However, has not yet achieved this goal.The pt will continue to benefit from skilled therapy to address remaining deficits in order to improve overall QoL and return to PLOF.     Personal Factors and Comorbidities Comorbidity 3+;Past/Current Experience;Time since onset of injury/illness/exacerbation    Comorbidities PMHx includes: Bilateral LBP with right sided sciatica, Lumbosacral spondylosis myelopathy, closed compression Fx of L1. Has chronic insomnia. osteoarthritis, Panic anxiety syndrome (taking medication)    Examination-Activity Limitations Bend;Carry;Locomotion Level;Sit;Squat;Stairs;Stand;Transfers    Examination-Participation Restrictions Cleaning;Community Activity;Driving;Laundry;Meal Prep;Occupation;Shop;Volunteer;Yard Work    Stability/Clinical Decision Making Stable/Uncomplicated    Rehab Potential Good    PT Frequency 2x / week    PT Duration 12 weeks    PT Treatment/Interventions Cryotherapy;Electrical Stimulation;Moist Heat;Traction;Gait training;Stair training;Functional mobility training;Therapeutic activities;Therapeutic exercise;Balance training;Neuromuscular re-education;Patient/family education;Manual techniques;Passive range of motion;Dry needling;Energy conservation    PT Next Visit Plan  Progress core stabilization, Manual therapy for low back and LE ROM, instruct in balance HEP, continue plan       Consulted and Agree with Plan of Care Patient              4:43 PM, 03/12/22 Ricard Dillon PT, DPT   Physical Therapist - [facility]  Outpatient Physical Therapy- [facility] [address] [place], [place], [address] Phone: [phone]   Fax:  [phone]

## 2022-03-15 NOTE — Therapy (Signed)
OUTPATIENT OCCUPATIONAL THERAPY NEURO  TREATMENT NOTE   Patient Name: David Reeves MRN: [id number] DOB:17-Feb-1971, 51 y.o., male Today's Date: 03/15/2022  PCP: [name], PA-C REFERRING PROVIDER: [name], PA-C   OT End of Session - 03/15/22 1223     Visit Number 15    Number of Visits 62    Date for OT Re-Evaluation 04/16/22    Authorization Type Progress reporting period starting 03/01/22    OT Start Time 0916    OT Stop Time 1000    OT Time Calculation (min) 44 min    Activity Tolerance Patient tolerated treatment well    Behavior During Therapy [facility] for tasks assessed/performed               Past Medical History:  Diagnosis Date   Back pain 04/22/2012   Bone spur    Bulging disc 04/22/2012   Degenerative disc disease    Osteoarthritis    Panic anxiety syndrome    Stroke ([place]) 10/2020   Taking multiple medications for chronic disease    Past Surgical History:  Procedure Laterality Date   CYSTECTOMY     head   HERNIA REPAIR Left 2006,2014   [facility]   TOE SURGERY Right 2007   Patient Active Problem List   Diagnosis Date Noted   Alterations of sensations following cerebrovascular accident 03/08/2021   Aphasia as late effect of cerebrovascular accident (CVA) 03/08/2021   Muscle spasm 02/22/2021   Acute non-recurrent frontal sinusitis 02/22/2021   Chronic pain syndrome 01/23/2021   Primary hypertension 01/23/2021   Nerve pain 01/23/2021   Erectile dysfunction due to diseases classified elsewhere 01/23/2021   Cognitive dysfunction 01/23/2021   Scrotal edema 01/23/2021   Elevated LDL cholesterol level 01/23/2021   History of stroke with residual deficit 01/23/2021   Primary insomnia 01/23/2021   Mouth pain 01/23/2021   Laceration of right hand without foreign body    Closed compression fracture of L1 vertebra ([place]) 05/24/2016   Lumbar stenosis with neurogenic claudication 11/01/2015   Chronic bilateral low back pain with right-sided  sciatica 11/08/2014   Hx of hemorrhoids 11/08/2014   AAA (abdominal aortic aneurysm) ([place]) 09/22/2014   Chronic pain associated with significant psychosocial dysfunction 09/22/2014   Panic disorder 09/22/2014   AB (asthmatic bronchitis) 08/17/2014   Anxiety 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 08/17/2014   Backache 08/17/2014   Lumbosacral spondylosis without myelopathy 08/17/2014   Disorder of male genital organs 08/17/2014   Brash 08/17/2014   Low back pain 08/17/2014   Tendon nodule 08/17/2014   Episodic paroxysmal anxiety disorder 08/17/2014   Hernia, inguinal, right 08/17/2014   Fast heart beat 08/17/2014   Illness 08/17/2014   Inguinal hernia 10/14/2012    ONSET DATE: 11/09/2020  REFERRING DIAG: CVA  THERAPY DIAG:  Muscle weakness (generalized)  Other lack of coordination  H/O ischemic left MCA stroke  Rationale for Evaluation and Treatment Rehabilitation  PERTINENT HISTORY: Pt. is a 51 y.o. who was diagnosed with a CVA on July 21st, 2022. Pt. completed several weeks of  inpatient rehab at [facility]. After returning to home, Pt. sustained a fall in december of 2022, and was admitted to the hospital with COVID-19, and back pain from the fall. Since the most recent discharge, Pt. has been residing with the ex-wife until the Pt. is ready to return to independent living. Pt. PMHx includes: Bilateral LBP with right sided sciatica, Lumbosacral spondylosis myelopathy, closed compression Fx of L1. Pt. enjoys cooking, and riding motorcycles.  PRECAUTIONS: Fall  SUBJECTIVE: Pt reports doing well today.   PAIN:  Are you having pain? Yes: NPRS scale: 1/10 Pain location: low back Pain description: aching Aggravating factors: prolonged sitting or lying down Relieving factors: rest, heat, walking  [facility] OT Assessment        12/11/21 01/22/22 03/01/22             Observation/Other Assessments      Focus on Therapeutic Outcomes (FOTO)   46 50 49             Coordination       Right 9 Hole Peg Test unable  unable unable    Left 9 Hole Peg Test 30 sec  30 sec NT             Hand Function      Right Hand Grip (lbs) 35 (3rd setting) 30 lb (3rd);  41 lb (4th setting),  44 lbs (4th setting)    Right Hand Lateral Pinch 9 lbs   slipping 12 lbs (thumb slips) 16 lbs (multiple trials d/t thumb slips)    Right Hand 3 Point Pinch   unable to position thumb to produce 3 pt pinch Unable to position thumb to produce 3 point pinch unable    Left Hand Grip (lbs) 116       Left Hand Lateral Pinch 29 lbs       Left 3 point pinch 27 lbs         OBJECTIVE:  TODAY'S TREATMENT:  Therapeutic Exercise: Completed wrist maze R hand x3 reps, min vc to minimize compensatory proximal movements on the 3rd trial when pt was more fatigued.  Participated in place and hold for thumb radial abd, resisted lateral pinch pushing into foam dowel, active assisted palmar abd x3 sets 7-10 reps each.  Performed active digit abd and active assisted add, working to strengthen intrinsics x3 sets 7-10 each.  Facilitated 3 point pinch, pushing into foam dowel, with OT providing set up and stabilization of digits in 3 point pinch pattern while pt pinched into dowel for 3 sets 10 reps each.  Pt requires rest breaks between each set for above noted exercises.     Neuro re-ed: Facilitated 3 point pinch patterns working to place and remove ball pegs from pegboard.   PATIENT EDUCATION: Education details: HEP progression, grasp/release activities; wrist flex against gravity, lateral and 3 point pinch with foam dowel Person educated: Patient Education method: Explanation Education comprehension: verbalized and demonstrated understanding.      OT Short Term Goals -      OT SHORT TERM GOAL #1   Title Pt. wil demonstrtae independence with HEPs    Baseline Eval: Pt. currently does not have one, 10th visit:  changes in HEP ongoing; 20th: instructed in putty exercises, changes ongoing; 40th: continual updates to HEP  as pt progresses; 50th: continual updates; 01/22/22: continual updates; 03/01/22: continual updates   Time 6    Period Weeks    Status On-going    Target Date 03/05/22             OT Long Term Goals -       OT LONG TERM GOAL #1   Title Pt. will improve RUE strength by 2 mm grades to assist with ADLs, and IADLs,    Baseline Eval: Right shoulder flexion, abduction 3/5, elbow flexion, extension wrist extension 3+/5, 10th visit: improving with RUE strength by not yet met goal; 20th visit: R shd flex/abd 4-,  elbow flex/ext 4+, wrist flex 4-, wrist ext 4+; 40:  R shd flex/abd 4,  elbow flex/ext 4+, wrist flex 4, wrist ext 4+; 50th visit 8/22: R shoulder flex/abd 4/5, elbow flex/ext 5/5, wrist flex 4, wrist ext 4+; 01/22/22: shoulder flex/abd 4/5, elbow flex/ext 5/5, wrist flex 4, wrist ext 4+; 03/01/22: same as 01/22/22   Time 12    Period Weeks    Status On-going    Target Date 04/16/22      OT LONG TERM GOAL #2   Title Pt. will improve right grip by 10 lbs to prepare for firmly holding objects for IADLs.    Baseline Eval: R: 38#, L: 124# (In 3rd dynamometer slot); R 35# in 4th slot, 28# in 3rd slot, 40th: 47 in 4th slot; 50th: 12/11/21: 46# on 3rd slot; 01/22/22: 30# on 3rd slot; 03/01/22: 44# 4th slot   Time 12    Period Weeks    Status On-going    Target Date 04/16/22      OT LONG TERM GOAL #3   Title Pt. will improve right lateral pinch strength by 5# to assist with cutting food    Baseline Eval: R: 17, L: 25; 20th: R: 18; 40th: 15# fingers slipping on meter, 50th: 9# fingers slipping; 01/22/22: 12# fingers slipping; 03/01/22: 16#   Time 12    Period Weeks    Status On-going    Target Date 04/16/22      OT LONG TERM GOAL #4   Title Pt. will improve Right 3pt pinch by 2# to be able to hold/open items for cooking    Baseline Eval: R: Pt. unable to engage thumb L: 29#; 20th: R unable; 40th:  unable, fingers slipping; 50th:  unable, fingers slipping; 01/22/22: fingers slipping but pt is  now able to grasp a ball peg with 3 point pinch pattern; 03/01/22: same as 10/3 in addition to being able to pick up ball peg from pegboard then place peg back into pegboard with a 3 point pinch pattern.   Time 12    Period Weeks    Status On-going    Target Date 04/16/22      OT LONG TERM GOAL #5   Title Pt. will improve right hand [facility] skills to be able to independently manipulate buttons, and zippers.    Baseline Eval: Pt. has difficulty managing buttons,a nd zippers. 10th visit:  improving; 20th: unable to engage R hand effectively, performs 1 handed with L; 50th: performs L handed only; 01/22/22: pt can use R hand as a stabilizer with clothing fasteners with mod vc; 03/01/22: same as 01/22/22   Time 12    Period Weeks    Status On-going    Target Date 04/16/22      OT LONG TERM GOAL #6   Title Pt. will independently write his name    Baseline Eval: Pt. is unable to hold a pen; 20th: pt can grip PenAgain with R hand and can make marks on paper with difficulty, but not yet writing name; 40th:  continues to demonstrate difficulty with grasp of pen; 50th 12/11/21: With assist to set up hand on pen again, pt was able to write first name and initial of last name in 1 1/4" tall letters with fair legibility (pt wants to defer this goal, states this is low on his list of priorities for now)   Time 12    Period Weeks    Status Defer   Target Date 04/16/22  OT LONG TERM GOAL #7   Title Pt. will improve FOTO score by 2 points to reflect funational improvement    Baseline Eval: FOTO score 46 with TR score 52; 20th: 45; 40th: 58;  50th: 46; 01/22/22: 50; 03/01/22: 49   Time 12    Period Weeks    Status On-going    Target Date 04/16/22             Plan -    Clinical Impression Statement Completed wrist maze R hand x3 reps, min vc to minimize compensatory proximal movements on the 3rd trial when pt was more fatigued.  Pt continues to focus on intrinsic strengthening and pinch prehension  patterns with lateral and 3 point pinch. Pt will continue to work towards neuro re-ed of right hand with emphasis on thumb movements to integrate into functional grasping and manipulation skills.    OT Occupational Profile and History Detailed Assessment- Review of Records and additional review of physical, cognitive, psychosocial history related to current functional performance    Occupational performance deficits (Please refer to evaluation for details): ADL's;IADL's;Education    Rehab Potential Good    Clinical Decision Making Several treatment options, min-mod task modification necessary    Comorbidities Affecting Occupational Performance: May have comorbidities impacting occupational performance    Modification or Assistance to Complete Evaluation  Min-Moderate modification of tasks or assist with assess necessary to complete eval    OT Frequency 2x / week    OT Duration 12 weeks    OT Treatment/Interventions Self-care/ADL training;DME and/or AE instruction;Therapeutic exercise;Ultrasound;Neuromuscular education;Therapeutic activities;Energy conservation;Moist Heat;Patient/family education;Splinting;Functional Mobility Training;Paraffin    Plan Neuro re-ed and therapeutic exercise    Consulted and Agree with Plan of Care Patient             Leta Speller, MS, OTR/L   Darleene Cleaver, OT 03/15/2022, 12:25 PM

## 2022-03-19 ENCOUNTER — Ambulatory Visit: Payer: Medicare Other

## 2022-03-19 DIAGNOSIS — Z8673 Personal history of transient ischemic attack (TIA), and cerebral infarction without residual deficits: Secondary | ICD-10-CM

## 2022-03-19 DIAGNOSIS — M6281 Muscle weakness (generalized): Secondary | ICD-10-CM

## 2022-03-19 DIAGNOSIS — R269 Unspecified abnormalities of gait and mobility: Secondary | ICD-10-CM

## 2022-03-19 DIAGNOSIS — R262 Difficulty in walking, not elsewhere classified: Secondary | ICD-10-CM | POA: Diagnosis not present

## 2022-03-19 DIAGNOSIS — R2681 Unsteadiness on feet: Secondary | ICD-10-CM

## 2022-03-19 DIAGNOSIS — M545 Low back pain, unspecified: Secondary | ICD-10-CM

## 2022-03-19 DIAGNOSIS — R278 Other lack of coordination: Secondary | ICD-10-CM

## 2022-03-19 NOTE — Therapy (Signed)
OUTPATIENT OCCUPATIONAL THERAPY NEURO  TREATMENT NOTE   Patient Name: David Reeves MRN: [id number] DOB:08/28/1970, 51 y.o., male Today's Date: 03/19/2022  PCP: [name], PA-C REFERRING PROVIDER: [name], PA-C   OT End of Session - 03/19/22 0933     Visit Number 74    Number of Visits 68    Date for OT Re-Evaluation 04/16/22    Authorization Type Progress reporting period starting 03/01/22    OT Start Time 0830    OT Stop Time 0915    OT Time Calculation (min) 45 min    Activity Tolerance Patient tolerated treatment well    Behavior During Therapy [facility] for tasks assessed/performed               Past Medical History:  Diagnosis Date   Back pain 04/22/2012   Bone spur    Bulging disc 04/22/2012   Degenerative disc disease    Osteoarthritis    Panic anxiety syndrome    Stroke ([place]) 10/2020   Taking multiple medications for chronic disease    Past Surgical History:  Procedure Laterality Date   CYSTECTOMY     head   HERNIA REPAIR Left 2006,2014   [facility]   TOE SURGERY Right 2007   Patient Active Problem List   Diagnosis Date Noted   Alterations of sensations following cerebrovascular accident 03/08/2021   Aphasia as late effect of cerebrovascular accident (CVA) 03/08/2021   Muscle spasm 02/22/2021   Acute non-recurrent frontal sinusitis 02/22/2021   Chronic pain syndrome 01/23/2021   Primary hypertension 01/23/2021   Nerve pain 01/23/2021   Erectile dysfunction due to diseases classified elsewhere 01/23/2021   Cognitive dysfunction 01/23/2021   Scrotal edema 01/23/2021   Elevated LDL cholesterol level 01/23/2021   History of stroke with residual deficit 01/23/2021   Primary insomnia 01/23/2021   Mouth pain 01/23/2021   Laceration of right hand without foreign body    Closed compression fracture of L1 vertebra ([place]) 05/24/2016   Lumbar stenosis with neurogenic claudication 11/01/2015   Chronic bilateral low back pain with right-sided  sciatica 11/08/2014   Hx of hemorrhoids 11/08/2014   AAA (abdominal aortic aneurysm) ([place]) 09/22/2014   Chronic pain associated with significant psychosocial dysfunction 09/22/2014   Panic disorder 09/22/2014   AB (asthmatic bronchitis) 08/17/2014   Anxiety 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 08/17/2014   Backache 08/17/2014   Lumbosacral spondylosis without myelopathy 08/17/2014   Disorder of male genital organs 08/17/2014   Brash 08/17/2014   Low back pain 08/17/2014   Tendon nodule 08/17/2014   Episodic paroxysmal anxiety disorder 08/17/2014   Hernia, inguinal, right 08/17/2014   Fast heart beat 08/17/2014   Illness 08/17/2014   Inguinal hernia 10/14/2012    ONSET DATE: 11/09/2020  REFERRING DIAG: CVA  THERAPY DIAG:  Muscle weakness (generalized)  Other lack of coordination  H/O ischemic left MCA stroke  Rationale for Evaluation and Treatment Rehabilitation  PERTINENT HISTORY: Pt. is a 51 y.o. who was diagnosed with a CVA on July 21st, 2022. Pt. completed several weeks of  inpatient rehab at [facility]. After returning to home, Pt. sustained a fall in december of 2022, and was admitted to the hospital with COVID-19, and back pain from the fall. Since the most recent discharge, Pt. has been residing with the ex-wife until the Pt. is ready to return to independent living. Pt. PMHx includes: Bilateral LBP with right sided sciatica, Lumbosacral spondylosis myelopathy, closed compression Fx of L1. Pt. enjoys cooking, and riding motorcycles.  PRECAUTIONS: Fall  SUBJECTIVE: Pt reports he's waiting on a call back from his pain doctor.  Pain is increased at 3/10 today.  PAIN:  Are you having pain? Yes: NPRS scale: 3/10 Pain location: low back Pain description: aching Aggravating factors: prolonged sitting or lying down Relieving factors: rest, heat, walking  [facility] OT Assessment        12/11/21 01/22/22 03/01/22             Observation/Other Assessments      Focus on  Therapeutic Outcomes (FOTO)   46 50 49             Coordination      Right 9 Hole Peg Test unable  unable unable    Left 9 Hole Peg Test 30 sec  30 sec NT             Hand Function      Right Hand Grip (lbs) 35 (3rd setting) 30 lb (3rd);  41 lb (4th setting),  44 lbs (4th setting)    Right Hand Lateral Pinch 9 lbs   slipping 12 lbs (thumb slips) 16 lbs (multiple trials d/t thumb slips)    Right Hand 3 Point Pinch   unable to position thumb to produce 3 pt pinch Unable to position thumb to produce 3 point pinch unable    Left Hand Grip (lbs) 116       Left Hand Lateral Pinch 29 lbs       Left 3 point pinch 27 lbs         OBJECTIVE:  TODAY'S TREATMENT:  Therapeutic Exercise: Completed wrist maze R hand x3 reps, min vc to minimize compensatory proximal movements on the 3rd trial when pt was more fatigued.  Participated in place and hold for thumb radial abd, resisted lateral pinch pushing into foam dowel, active assisted palmar abd, and active assisted digit flex/ext with OT assisting 50% of the time to delay thumb closure over fist; x2 sets 7-10 reps each.  Performed active digit abd and active assisted add, working to strengthen intrinsics x2 sets 7 each. Facilitated 3 point pinch, pushing into foam dowel, with OT providing set up and stabilization of digits in 3 point pinch pattern while pt pinched into dowel for 2 sets 10 reps each.  Pt requires rest breaks between each set for above noted exercises.     Neuro re-ed: Facilitated 3 point pinch patterns working to place and remove jumbo pegs from pegboard.  Also practiced grasping pegs with a fist with OT providing initial min A to delay thumb closure so fingers could tuck around peg and thumb closed around fingers.  Thumb otherwise tucks around peg and fingers close over thumb.  PATIENT EDUCATION: Education details: HEP progression, grasp/release activities; wrist flex against gravity, lateral and 3 point pinch with foam dowel Person  educated: Patient Education method: Explanation Education comprehension: verbalized and demonstrated understanding.      OT Short Term Goals -      OT SHORT TERM GOAL #1   Title Pt. wil demonstrtae independence with HEPs    Baseline Eval: Pt. currently does not have one, 10th visit:  changes in HEP ongoing; 20th: instructed in putty exercises, changes ongoing; 40th: continual updates to HEP as pt progresses; 50th: continual updates; 01/22/22: continual updates; 03/01/22: continual updates   Time 6    Period Weeks    Status On-going    Target Date 03/05/22  OT Long Term Goals -       OT LONG TERM GOAL #1   Title Pt. will improve RUE strength by 2 mm grades to assist with ADLs, and IADLs,    Baseline Eval: Right shoulder flexion, abduction 3/5, elbow flexion, extension wrist extension 3+/5, 10th visit: improving with RUE strength by not yet met goal; 20th visit: R shd flex/abd 4-, elbow flex/ext 4+, wrist flex 4-, wrist ext 4+; 40:  R shd flex/abd 4,  elbow flex/ext 4+, wrist flex 4, wrist ext 4+; 50th visit 8/22: R shoulder flex/abd 4/5, elbow flex/ext 5/5, wrist flex 4, wrist ext 4+; 01/22/22: shoulder flex/abd 4/5, elbow flex/ext 5/5, wrist flex 4, wrist ext 4+; 03/01/22: same as 01/22/22   Time 12    Period Weeks    Status On-going    Target Date 04/16/22      OT LONG TERM GOAL #2   Title Pt. will improve right grip by 10 lbs to prepare for firmly holding objects for IADLs.    Baseline Eval: R: 38#, L: 124# (In 3rd dynamometer slot); R 35# in 4th slot, 28# in 3rd slot, 40th: 47 in 4th slot; 50th: 12/11/21: 46# on 3rd slot; 01/22/22: 30# on 3rd slot; 03/01/22: 44# 4th slot   Time 12    Period Weeks    Status On-going    Target Date 04/16/22      OT LONG TERM GOAL #3   Title Pt. will improve right lateral pinch strength by 5# to assist with cutting food    Baseline Eval: R: 17, L: 25; 20th: R: 18; 40th: 15# fingers slipping on meter, 50th: 9# fingers slipping; 01/22/22:  12# fingers slipping; 03/01/22: 16#   Time 12    Period Weeks    Status On-going    Target Date 04/16/22      OT LONG TERM GOAL #4   Title Pt. will improve Right 3pt pinch by 2# to be able to hold/open items for cooking    Baseline Eval: R: Pt. unable to engage thumb L: 29#; 20th: R unable; 40th:  unable, fingers slipping; 50th:  unable, fingers slipping; 01/22/22: fingers slipping but pt is now able to grasp a ball peg with 3 point pinch pattern; 03/01/22: same as 10/3 in addition to being able to pick up ball peg from pegboard then place peg back into pegboard with a 3 point pinch pattern.   Time 12    Period Weeks    Status On-going    Target Date 04/16/22      OT LONG TERM GOAL #5   Title Pt. will improve right hand [facility] skills to be able to independently manipulate buttons, and zippers.    Baseline Eval: Pt. has difficulty managing buttons,a nd zippers. 10th visit:  improving; 20th: unable to engage R hand effectively, performs 1 handed with L; 50th: performs L handed only; 01/22/22: pt can use R hand as a stabilizer with clothing fasteners with mod vc; 03/01/22: same as 01/22/22   Time 12    Period Weeks    Status On-going    Target Date 04/16/22      OT LONG TERM GOAL #6   Title Pt. will independently write his name    Baseline Eval: Pt. is unable to hold a pen; 20th: pt can grip PenAgain with R hand and can make marks on paper with difficulty, but not yet writing name; 40th:  continues to demonstrate difficulty with grasp of pen; 50th 12/11/21: With assist  to set up hand on pen again, pt was able to write first name and initial of last name in 1 1/4" tall letters with fair legibility (pt wants to defer this goal, states this is low on his list of priorities for now)   Time 12    Period Weeks    Status Defer   Target Date 04/16/22      OT LONG TERM GOAL #7   Title Pt. will improve FOTO score by 2 points to reflect funational improvement    Baseline Eval: FOTO score 46 with TR score  52; 20th: 45; 40th: 44;  50th: 46; 01/22/22: 50; 03/01/22: 49   Time 12    Period Weeks    Status On-going    Target Date 04/16/22             Plan -    Clinical Impression Statement Completed wrist maze R hand x3 reps, min vc to minimize compensatory proximal movements on the 3rd trial when pt was more fatigued.  Pt continues to focus on intrinsic strengthening, pinch prehension patterns with lateral and 3 point pinch, and gripping items without tucking thumb.  Pt requires OT assist ~50% of the time to delay thumb closure to prevent thumb tucking under digits 2-5.  Pt reports he's started to practice this when he reaches for the refrigerator door with his R hand.  Pt will continue to work towards neuro re-ed of right hand with emphasis on thumb movements to integrate into functional grasping and manipulation skills.    OT Occupational Profile and History Detailed Assessment- Review of Records and additional review of physical, cognitive, psychosocial history related to current functional performance    Occupational performance deficits (Please refer to evaluation for details): ADL's;IADL's;Education    Rehab Potential Good    Clinical Decision Making Several treatment options, min-mod task modification necessary    Comorbidities Affecting Occupational Performance: May have comorbidities impacting occupational performance    Modification or Assistance to Complete Evaluation  Min-Moderate modification of tasks or assist with assess necessary to complete eval    OT Frequency 2x / week    OT Duration 12 weeks    OT Treatment/Interventions Self-care/ADL training;DME and/or AE instruction;Therapeutic exercise;Ultrasound;Neuromuscular education;Therapeutic activities;Energy conservation;Moist Heat;Patient/family education;Splinting;Functional Mobility Training;Paraffin    Plan Neuro re-ed and therapeutic exercise    Consulted and Agree with Plan of Care Patient             Leta Speller,  MS, OTR/L   Darleene Cleaver, OT 03/19/2022, 9:34 AM

## 2022-03-19 NOTE — Therapy (Signed)
[place] MAIN [facility] SERVICES [address] [place], [place], [address] Phone: [phone]   Fax:  [phone]  Patient Details  Name: David Reeves MRN: [id number] Date of Birth: Apr 20, 1971 Referring Provider:  [name], PA-C  Encounter Date: 03/19/2022   OUTPATIENT PHYSICAL THERAPY TREATMENT NOTE      Patient Name: David Reeves MRN: [id number] DOB:[birth year], 51 y.o., male Today's Date: 03/19/2022  PCP: [name], PA-C REFERRING PROVIDER: [name], PA-C   PT End of Session - 03/19/22 0933     Visit Number 33    Number of Visits 75    Date for PT Re-Evaluation 04/26/22    Authorization Type [employer]    Authorization Time Period 02/01/22-04/26/22    Progress Note Due on Visit 82    PT Start Time 0930    PT Stop Time 1011    PT Time Calculation (min) 41 min    Equipment Utilized During Treatment Gait belt    Activity Tolerance Patient tolerated treatment well;No increased pain    Behavior During Therapy [facility] for tasks assessed/performed                  Past Medical History:  Diagnosis Date   Back pain 04/22/2012   Bone spur    Bulging disc 04/22/2012   Degenerative disc disease    Osteoarthritis    Panic anxiety syndrome    Stroke ([place]) 10/2020   Taking multiple medications for chronic disease    Past Surgical History:  Procedure Laterality Date   CYSTECTOMY     head   HERNIA REPAIR Left 2006,2014   [facility]   TOE SURGERY Right 2007   Patient Active Problem List   Diagnosis Date Noted   Alterations of sensations following cerebrovascular accident 03/08/2021   Aphasia as late effect of cerebrovascular accident (CVA) 03/08/2021   Muscle spasm 02/22/2021   Acute non-recurrent frontal sinusitis 02/22/2021   Chronic pain syndrome 01/23/2021   Primary hypertension 01/23/2021   Nerve pain 01/23/2021   Erectile dysfunction due to diseases classified elsewhere 01/23/2021    Cognitive dysfunction 01/23/2021   Scrotal edema 01/23/2021   Elevated LDL cholesterol level 01/23/2021   History of stroke with residual deficit 01/23/2021   Primary insomnia 01/23/2021   Mouth pain 01/23/2021   Laceration of right hand without foreign body    Closed compression fracture of L1 vertebra ([place]) 05/24/2016   Lumbar stenosis with neurogenic claudication 11/01/2015   Chronic bilateral low back pain with right-sided sciatica 11/08/2014   Hx of hemorrhoids 11/08/2014   AAA (abdominal aortic aneurysm) ([place]) 09/22/2014   Chronic pain associated with significant psychosocial dysfunction 09/22/2014   Panic disorder 09/22/2014   AB (asthmatic bronchitis) 08/17/2014   Anxiety 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 08/17/2014   Backache 08/17/2014   Lumbosacral spondylosis without myelopathy 08/17/2014   Disorder of male genital organs 08/17/2014   Brash 08/17/2014   Low back pain 08/17/2014   Tendon nodule 08/17/2014   Episodic paroxysmal anxiety disorder 08/17/2014   Hernia, inguinal, right 08/17/2014   Fast heart beat 08/17/2014   Illness 08/17/2014   Inguinal hernia 10/14/2012    REFERRING DIAG: History of stroke with residual deficit, chronic bilateral low back pain with right-sided sciatica, Lumbosacral spondylosis without myelopathy THERAPY DIAG:  Muscle weakness (generalized)  Other lack of coordination  H/O ischemic left MCA stroke  Unsteadiness on feet  Chronic bilateral low back pain without sciatica  Difficulty in walking, not elsewhere  classified  Abnormality of gait and mobility  Rationale for Evaluation and Treatment Rehabilitation  PERTINENT HISTORY: Pt. is a 51 y.o. who was diagnosed with a CVA on July 21st, 2022. Pt. completed several weeks of inpatient rehab at [facility]. After returning to home. He was discharged in late August/early September and recieved home health PT. Pt. sustained a fall in december of 2022, and was admitted to the hospital with  COVID-19, and Chronic back pain. He reports chronic back pain syndrome since 2012. Since the most recent discharge, Pt. has been residing with the ex-wife until the Pt. is ready to return to independent living. He did recieve 2 weeks of home health after discharge in December 2022. He is now being referred to outpatient PT to address weakness from stroke and improve fine motor movement. He reports rarely getting numbness/tingling in RLE. PMHx includes: Bilateral LBP with right sided sciatica, Lumbosacral spondylosis myelopathy, closed compression Fx of L1. Has chronic insomnia. osteoarthritis, Panic anxiety syndrome (taking medication) Pt. enjoys cooking, and riding motorcycles. Patient is going to [facility] spine center for Epidural spinal injections on 06/07/21;  PRECAUTIONS: Fall, Spinal Brace wears daily, approximately 25%, when exercising; semi-rigid lumbar brace  SUBJECTIVE: Patient reports  3/10 LBP- stating no major changes since last visit- States some difficulty with steps.    PAIN:  Are you having pain? Yes: NPRS scale: 3/10 Pain location: bilat low back Pain description: dull ache; always constant,  Aggravating factors: worse with sitting,  Relieving factors: heat helps temporarily, pain pills/pain patch   TODAY'S TREATMENT 03/19/22  TherEx Heat applied to low back while he performs the following (skin checked is WNL prior to application and upon removal of heat, no adverse reaction):  On mat table- LTRs 10x each way Bridges 10x- with single LE SLR [place]e Clamshell with BTB x 12 reps x 2 reps each Side  Manual stretching: Hold 30 sec x 3 (each LE)  Knee to chest Piriformis Hamstring   NMR:  Step tap alt LE without UE support x 20 reps each LE Step up then down with only 1 foot making contact with step and no UE support x 12 reps each LE (patient with more difficulty on right LE)  Side step up and down on/off 6" block without UE support.  Step up with high knee march on opp LE  x 12 reps.   Reports no increase in back pain with interventions  PATIENT EDUCATION:  Education details: Pt educated throughout session about proper posture and technique with exercises. Improved exercise technique, movement at target joints, use of target muscles after min to mod verbal, visual, tactile cues. Goals reassessment.  Person educated: Patient Education method: Explanation, Demonstration, Tactile cues, and Verbal cues Education comprehension: verbalized understanding, returned demonstration, verbal cues required, tactile cues required, and needs further education   HOME EXERCISE PROGRAM: No updates on this date pt to continue HEP as previously given     PT Short Term Goals -       PT SHORT TERM GOAL #1   Title Patient will be adherent to HEP at least 3x a week to improve functional strength and balance for better safety at home.    Baseline 4/4: doing them 1-2x a week; 08/20/2021=Patient reports performing his low back stretching and no questions currently HEP. 10/24/21: doing HEP 4x a week   Time 4    Period Weeks    Status Achieved    Target Date 08/21/21      PT SHORT  TERM GOAL #2   Title Patient (< 50 years old) will complete five times sit to stand test in < 15 seconds indicating an increased LE strength and improved balance.    Baseline 4/4: 17.85 sec with arms across chest; 08/20/2021= 17.6 sec , 7/5: 12.85 sec   Time 4    Period Weeks    Status Achieved;    Target Date 10/01/21              PT Long Term Goals -  TARGET DATE FOR GOALS 04/26/2022      PT LONG TERM GOAL #1   Title Patient will increase six minute walk test distance to >1000 for progression to community ambulator and improve gait ability    Baseline 4/4: 1115 feet , 7/5: 1080 feet;    Time 4    Period Weeks    Status Achieved    Target Date 08/21/21      PT LONG TERM GOAL #2   Title Patient will ascend/descend 8 stairs without rail assist independently without loss of balance to improve  ability to get in/out of home.    Baseline 4/4: requires 1 rail assist; 08/20/2021- Patient demonstrated steps without railing using reciprocal steps today. 7/5: ascend/descend 6 steps reciprocally without rail, but required CGA for safety when descending; 7/25: Able to complete without UE support and good balance going up steps, unsafe descending steps without UE support. Required min assist and UE support to prevent LOB descending.; 8/15: requires UUE support to descend and close CGA due to; unsteadiness; 9/26: ascend without UE support, recip pattern; descend with UUE support, recip stepping, but still doesn't feel steady yet with descending; 10/13 able to demo recip pattern ascending hands free, required instance of UE support with descending, recip pattern due to heel getting caught under step; 11/14: deferred; 11/21: mod assist with descending due to unsteadiness, able to complete without UE support ascending   Time 12*corrected   Period Weeks    Status Partially met   Target Date      PT LONG TERM GOAL #3   Title Patient will increase BLE gross strength to 4+/5 as to improve functional strength for independent gait, increased standing tolerance and increased ADL ability.    Baseline 4/4: not formally assessed; 08/20/2021= 4+/5 with Right hip flex/knee ext/flex; Right hip abd= 4/5, 7/5: 4+/5 hip    Time 12    Period Weeks    Status Achieved;    Target Date 11/12/21      PT LONG TERM GOAL #4   Title Patient will improve FOTO score to >50% to indicate improved functional mobility with less pain with ADLs.    Baseline 4/4: 35%; 08/20/2021= 42% 7/5: 47%; 7:25:recently addressed; 11/30/21: 35; 9/26: 54%    Time 12    Period Weeks    Status MET   Target Date      PT LONG TERM GOAL #5   Title Patient will report a worst pain of 4/10 in low back over last week to indicate improved tolerance with ADLs.    Baseline 4/4: 7/10; 08/20/2021= 4/10 , 7/5: worst 7/10 in low back; 7/25: 6/10 ; 8/15: pt reports  6/10; 9/26: 4/10   Time 12    Period Weeks    Status Achieved    Target Date 02/05/2022      Additional Long Term Goals   Additional Long Term Goals Yes      PT LONG TERM GOAL #6   Title Pt will  increase 10MWT by at least 0.13 m/s in order to demonstrate clinically significant improvement in community ambulation    Baseline 08/20/2021=0.94 m/s 7/5: 1.5 m/s   Time 12    Period Weeks    Status Achieved;    Target Date 11/12/21      PT LONG TERM GOAL #7   Title Patient will demonstrate improved static standing balance as seen by ability to single leg stance on right LE > 12 sec consistently for optimal balance on level and unlevel surfaces.    Baseline 08/20/2021= 4 sec SLS on right , 7/5: 4-5 sec inconsistent; 7/25 able to achieve 15 sec after multiple attempts of 4-5 sec holds; 8/15: LLE 15 sec, RLE 8 sec; 9/26: LLE 30 sec, RLE 17 seconds   Time 12    Period Weeks    Status MET   Target Date 02/05/2022    PT LONG TERM GOAL #8  Title Pt will report ability to ambulate for at least 15 minutes without an increase in his LBP in order to improve community participation and ease with ADLs  Baseline 9/26: increase in pain at 15 min mark; 10/13: reports about 20 minutes.   Time 3   Period Weeks   Status MET  Target Date 02/05/2022        PT LONG TERM GOAL #9  Title Pt will report ability to ambulate for at least 45 minutes without an increase in his LBP in order to improve community participation and ease with ADLs  Baseline 9/26: increase in pain at 15 min mark; 10/13: reports about 20 minutes. 11/14: deferred; 11/21: Unable to complete 45 min gait without pain, but does report this has improved. Thinks he can do 30 min before pain increase.  Time 3   Period Weeks   Status ONGOING  Target Date        Plan -     Clinical Impression Statement Patient presents today with good motivation- Able to perform core and later balance exercises without report of increased low back pain. He was  challenged more with standing on right LE with some weakness yet good eccentric control with step downs today.  The pt will continue to benefit from skilled therapy to address remaining deficits in order to improve overall QoL and return to PLOF.     Personal Factors and Comorbidities Comorbidity 3+;Past/Current Experience;Time since onset of injury/illness/exacerbation    Comorbidities PMHx includes: Bilateral LBP with right sided sciatica, Lumbosacral spondylosis myelopathy, closed compression Fx of L1. Has chronic insomnia. osteoarthritis, Panic anxiety syndrome (taking medication)    Examination-Activity Limitations Bend;Carry;Locomotion Level;Sit;Squat;Stairs;Stand;Transfers    Examination-Participation Restrictions Cleaning;Community Activity;Driving;Laundry;Meal Prep;Occupation;Shop;Volunteer;Yard Work    Stability/Clinical Decision Making Stable/Uncomplicated    Rehab Potential Good    PT Frequency 2x / week    PT Duration 12 weeks    PT Treatment/Interventions Cryotherapy;Electrical Stimulation;Moist Heat;Traction;Gait training;Stair training;Functional mobility training;Therapeutic activities;Therapeutic exercise;Balance training;Neuromuscular re-education;Patient/family education;Manual techniques;Passive range of motion;Dry needling;Energy conservation    PT Next Visit Plan  Progress core stabilization, Manual therapy for low back and LE ROM, instruct in balance HEP, continue plan       Consulted and Agree with Plan of Care Patient              5:00 PM, 03/19/22 Ollen Bowl, PT  Physical Therapist - [facility]  Outpatient Physical Therapy- [place] MAIN [facility] SERVICES [place],  [place], [address] Phone: [phone]   Fax:  [phone]

## 2022-03-21 ENCOUNTER — Ambulatory Visit: Payer: Medicare Other

## 2022-03-21 ENCOUNTER — Telehealth: Payer: Self-pay | Admitting: Physician Assistant

## 2022-03-21 ENCOUNTER — Encounter: Payer: Self-pay | Admitting: Physician Assistant

## 2022-03-21 DIAGNOSIS — R262 Difficulty in walking, not elsewhere classified: Secondary | ICD-10-CM | POA: Diagnosis not present

## 2022-03-21 DIAGNOSIS — R278 Other lack of coordination: Secondary | ICD-10-CM

## 2022-03-21 DIAGNOSIS — R2681 Unsteadiness on feet: Secondary | ICD-10-CM

## 2022-03-21 DIAGNOSIS — M6281 Muscle weakness (generalized): Secondary | ICD-10-CM

## 2022-03-21 DIAGNOSIS — Z8673 Personal history of transient ischemic attack (TIA), and cerebral infarction without residual deficits: Secondary | ICD-10-CM

## 2022-03-21 NOTE — Therapy (Signed)
[place] [facility] SERVICES [address] [place], [place], [address] Phone: [phone]   Fax:  [phone]  Patient Details  Name: David Reeves MRN: [id number] Date of Birth: Apr 09, 1971 Referring Provider:  [name], PA-C  Encounter Date: 03/21/2022   OUTPATIENT PHYSICAL THERAPY TREATMENT NOTE      Patient Name: David Reeves MRN: [id number] DOB:[birth year], 51 y.o., male Today's Date: 03/21/2022  PCP: [name], PA-C REFERRING PROVIDER: [name], PA-C   PT End of Session - 03/21/22 1334     Visit Number 82    Number of Visits 72    Date for PT Re-Evaluation 04/26/22    Authorization Type [employer]    Authorization Time Period 02/01/22-04/26/22    Progress Note Due on Visit 68    PT Start Time 0933    PT Stop Time 1015    PT Time Calculation (min) 42 min    Equipment Utilized During Treatment Gait belt    Activity Tolerance Patient tolerated treatment well;No increased pain    Behavior During Therapy [facility] for tasks assessed/performed                  Past Medical History:  Diagnosis Date   Back pain 04/22/2012   Bone spur    Bulging disc 04/22/2012   Degenerative disc disease    Osteoarthritis    Panic anxiety syndrome    Stroke ([place]) 10/2020   Taking multiple medications for chronic disease    Past Surgical History:  Procedure Laterality Date   CYSTECTOMY     head   HERNIA REPAIR Left 2006,2014   [facility]   TOE SURGERY Right 2007   Patient Active Problem List   Diagnosis Date Noted   Alterations of sensations following cerebrovascular accident 03/08/2021   Aphasia as late effect of cerebrovascular accident (CVA) 03/08/2021   Muscle spasm 02/22/2021   Acute non-recurrent frontal sinusitis 02/22/2021   Chronic pain syndrome 01/23/2021   Primary hypertension 01/23/2021   Nerve pain 01/23/2021   Erectile dysfunction due to diseases classified elsewhere 01/23/2021    Cognitive dysfunction 01/23/2021   Scrotal edema 01/23/2021   Elevated LDL cholesterol level 01/23/2021   History of stroke with residual deficit 01/23/2021   Primary insomnia 01/23/2021   Mouth pain 01/23/2021   Laceration of right hand without foreign body    Closed compression fracture of L1 vertebra ([place]) 05/24/2016   Lumbar stenosis with neurogenic claudication 11/01/2015   Chronic bilateral low back pain with right-sided sciatica 11/08/2014   Hx of hemorrhoids 11/08/2014   AAA (abdominal aortic aneurysm) ([place]) 09/22/2014   Chronic pain associated with significant psychosocial dysfunction 09/22/2014   Panic disorder 09/22/2014   AB (asthmatic bronchitis) 08/17/2014   Anxiety 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 08/17/2014   Backache 08/17/2014   Lumbosacral spondylosis without myelopathy 08/17/2014   Disorder of male genital organs 08/17/2014   Brash 08/17/2014   Low back pain 08/17/2014   Tendon nodule 08/17/2014   Episodic paroxysmal anxiety disorder 08/17/2014   Hernia, inguinal, right 08/17/2014   Fast heart beat 08/17/2014   Illness 08/17/2014   Inguinal hernia 10/14/2012    REFERRING DIAG: History of stroke with residual deficit, chronic bilateral low back pain with right-sided sciatica, Lumbosacral spondylosis without myelopathy THERAPY DIAG:  Muscle weakness (generalized)  Unsteadiness on feet  Other lack of coordination  Rationale for Evaluation and Treatment Rehabilitation  PERTINENT HISTORY: Pt. is a 51 y.o. who was diagnosed with a  CVA on July 21st, 2022. Pt. completed several weeks of inpatient rehab at [facility] After returning to home. He was discharged in late August/early September and recieved home health PT. Pt. sustained a fall in december of 2022, and was admitted to the hospital with COVID-19, and Chronic back pain. He reports chronic back pain syndrome since 2012. Since the most recent discharge, Pt. has been residing with the ex-wife until the Pt.  is ready to return to independent living. He did recieve 2 weeks of home health after discharge in December 2022. He is now being referred to outpatient PT to address weakness from stroke and improve fine motor movement. He reports rarely getting numbness/tingling in RLE. PMHx includes: Bilateral LBP with right sided sciatica, Lumbosacral spondylosis myelopathy, closed compression Fx of L1. Has chronic insomnia. osteoarthritis, Panic anxiety syndrome (taking medication) Pt. enjoys cooking, and riding motorcycles. Patient is going to [facility] spine center for Epidural spinal injections on 06/07/21;  PRECAUTIONS: Fall, Spinal Brace wears daily, approximately 25%, when exercising; semi-rigid lumbar brace  SUBJECTIVE: Current back pain is 2/10, started at 3/10 this morning. Pt reports increased knee pain over past 4 days (2/10) since it has been cold. Reports feeling improvement in balance every day.   PAIN:  Are you having pain? Yes: NPRS scale: 2/10 Pain location: bilat low back Pain description: dull ache; always constant,  Aggravating factors: worse with sitting,  Relieving factors: heat helps temporarily, pain pills/pain patch   TODAY'S TREATMENT 03/21/22  TherEx Heat applied to low back while he performs the following (skin checked is WNL prior to application and upon removal of heat, no adverse reaction):  On mat table- LTRs 20x  Singe leg glute bridge (some assist from contralat LE) - 10x each LE SLR with 4# AW donned 15x each LE. Rates medium Sidelye hip  abd with 4# 2x10 each LE. Reports easy LLE, difficulty with RLE due to coordination  Open book 8x each side. Reports feels good to UE  Manual stretching: Hold 20 sec (each LE)  Knee to chest Piriformis - good range, does not feel a stretch   NMR: several minutes of the following One foot on floor one on 6" step - performed each LE for SLB progression  -Pt then performed with horizontal head turns 10x in each LE position SLB 2x30  sec each LE Tandem stance 2x30 sec each LE and then with head turns (horizontal) 5-8x each   Reports no increase in back pain with interventions  PATIENT EDUCATION:  Education details: Pt educated throughout session about proper posture and technique with exercises. Improved exercise technique, movement at target joints, use of target muscles after min to mod verbal, visual, tactile cues. Goals reassessment.  Person educated: Patient Education method: Explanation, Demonstration, Tactile cues, and Verbal cues Education comprehension: verbalized understanding, returned demonstration, verbal cues required, tactile cues required, and needs further education   HOME EXERCISE PROGRAM: No updates on this date pt to continue HEP as previously given     PT Short Term Goals -       PT SHORT TERM GOAL #1   Title Patient will be adherent to HEP at least 3x a week to improve functional strength and balance for better safety at home.    Baseline 4/4: doing them 1-2x a week; 08/20/2021=Patient reports performing his low back stretching and no questions currently HEP. 10/24/21: doing HEP 4x a week   Time 4    Period Weeks    Status Achieved  Target Date 08/21/21      PT SHORT TERM GOAL #2   Title Patient (< 49 years old) will complete five times sit to stand test in < 15 seconds indicating an increased LE strength and improved balance.    Baseline 4/4: 17.85 sec with arms across chest; 08/20/2021= 17.6 sec , 7/5: 12.85 sec   Time 4    Period Weeks    Status Achieved;    Target Date 10/01/21              PT Long Term Goals -  TARGET DATE FOR GOALS 04/26/2022      PT LONG TERM GOAL #1   Title Patient will increase six minute walk test distance to >1000 for progression to community ambulator and improve gait ability    Baseline 4/4: 1115 feet , 7/5: 1080 feet;    Time 4    Period Weeks    Status Achieved    Target Date 08/21/21      PT LONG TERM GOAL #2   Title Patient will ascend/descend  8 stairs without rail assist independently without loss of balance to improve ability to get in/out of home.    Baseline 4/4: requires 1 rail assist; 08/20/2021- Patient demonstrated steps without railing using reciprocal steps today. 7/5: ascend/descend 6 steps reciprocally without rail, but required CGA for safety when descending; 7/25: Able to complete without UE support and good balance going up steps, unsafe descending steps without UE support. Required min assist and UE support to prevent LOB descending.; 8/15: requires UUE support to descend and close CGA due to; unsteadiness; 9/26: ascend without UE support, recip pattern; descend with UUE support, recip stepping, but still doesn't feel steady yet with descending; 10/13 able to demo recip pattern ascending hands free, required instance of UE support with descending, recip pattern due to heel getting caught under step; 11/14: deferred; 11/21: mod assist with descending due to unsteadiness, able to complete without UE support ascending   Time 12*corrected   Period Weeks    Status Partially met   Target Date      PT LONG TERM GOAL #3   Title Patient will increase BLE gross strength to 4+/5 as to improve functional strength for independent gait, increased standing tolerance and increased ADL ability.    Baseline 4/4: not formally assessed; 08/20/2021= 4+/5 with Right hip flex/knee ext/flex; Right hip abd= 4/5, 7/5: 4+/5 hip    Time 12    Period Weeks    Status Achieved;    Target Date 11/12/21      PT LONG TERM GOAL #4   Title Patient will improve FOTO score to >50% to indicate improved functional mobility with less pain with ADLs.    Baseline 4/4: 35%; 08/20/2021= 42% 7/5: 47%; 7:25:recently addressed; 11/30/21: 35; 9/26: 54%    Time 12    Period Weeks    Status MET   Target Date      PT LONG TERM GOAL #5   Title Patient will report a worst pain of 4/10 in low back over last week to indicate improved tolerance with ADLs.    Baseline 4/4: 7/10;  08/20/2021= 4/10 , 7/5: worst 7/10 in low back; 7/25: 6/10 ; 8/15: pt reports 6/10; 9/26: 4/10   Time 12    Period Weeks    Status Achieved    Target Date 02/05/2022      Additional Long Term Goals   Additional Long Term Goals Yes  PT LONG TERM GOAL #6   Title Pt will increase 10MWT by at least 0.13 m/s in order to demonstrate clinically significant improvement in community ambulation    Baseline 08/20/2021=0.94 m/s 7/5: 1.5 m/s   Time 12    Period Weeks    Status Achieved;    Target Date 11/12/21      PT LONG TERM GOAL #7   Title Patient will demonstrate improved static standing balance as seen by ability to single leg stance on right LE > 12 sec consistently for optimal balance on level and unlevel surfaces.    Baseline 08/20/2021= 4 sec SLS on right , 7/5: 4-5 sec inconsistent; 7/25 able to achieve 15 sec after multiple attempts of 4-5 sec holds; 8/15: LLE 15 sec, RLE 8 sec; 9/26: LLE 30 sec, RLE 17 seconds   Time 12    Period Weeks    Status MET   Target Date 02/05/2022    PT LONG TERM GOAL #8  Title Pt will report ability to ambulate for at least 15 minutes without an increase in his LBP in order to improve community participation and ease with ADLs  Baseline 9/26: increase in pain at 15 min mark; 10/13: reports about 20 minutes.   Time 3   Period Weeks   Status MET  Target Date 02/05/2022        PT LONG TERM GOAL #9  Title Pt will report ability to ambulate for at least 45 minutes without an increase in his LBP in order to improve community participation and ease with ADLs  Baseline 9/26: increase in pain at 15 min mark; 10/13: reports about 20 minutes. 11/14: deferred; 11/21: Unable to complete 45 min gait without pain, but does report this has improved. Thinks he can do 30 min before pain increase.  Time 3   Period Weeks   Status ONGOING  Target Date        Plan -     Clinical Impression Statement Pt continues to present with overall improvement in LE strength and  balance without increase in LBP (although baseline today is 1 point higher than usual at 2/10). Pt could trial more seated and standing therex future sessions to determine if he can now complete these activities without increased pain.The pt will continue to benefit from skilled therapy to address remaining deficits in order to improve overall QoL and return to PLOF.     Personal Factors and Comorbidities Comorbidity 3+;Past/Current Experience;Time since onset of injury/illness/exacerbation    Comorbidities PMHx includes: Bilateral LBP with right sided sciatica, Lumbosacral spondylosis myelopathy, closed compression Fx of L1. Has chronic insomnia. osteoarthritis, Panic anxiety syndrome (taking medication)    Examination-Activity Limitations Bend;Carry;Locomotion Level;Sit;Squat;Stairs;Stand;Transfers    Examination-Participation Restrictions Cleaning;Community Activity;Driving;Laundry;Meal Prep;Occupation;Shop;Volunteer;Yard Work    Stability/Clinical Decision Making Stable/Uncomplicated    Rehab Potential Good    PT Frequency 2x / week    PT Duration 12 weeks    PT Treatment/Interventions Cryotherapy;Electrical Stimulation;Moist Heat;Traction;Gait training;Stair training;Functional mobility training;Therapeutic activities;Therapeutic exercise;Balance training;Neuromuscular re-education;Patient/family education;Manual techniques;Passive range of motion;Dry needling;Energy conservation    PT Next Visit Plan  Progress core stabilization, Manual therapy for low back and LE ROM, instruct in balance HEP, continue plan       Consulted and Agree with Plan of Care Patient              1:38 PM, 03/21/22 Ricard Dillon PT, DPT   Physical Therapist - [place] [phone]  [place] [facility] SERVICES [address] [place], [place], [address] Phone: [phone]   Fax:   [phone]

## 2022-03-21 NOTE — Telephone Encounter (Signed)
Please advise 

## 2022-03-21 NOTE — Therapy (Addendum)
OUTPATIENT OCCUPATIONAL THERAPY NEURO  TREATMENT NOTE   Patient Name: David Reeves MRN: [id number] DOB:1970/12/19, 51 y.o., male Today's Date: 03/21/2022  PCP: [name], PA-C REFERRING PROVIDER: [name], PA-C   OT End of Session - 03/21/22 2129     Visit Number 16    Number of Visits 74    Date for OT Re-Evaluation 04/16/22    Authorization Type Progress reporting period starting 03/01/22    OT Start Time 0830    OT Stop Time 0915    OT Time Calculation (min) 45 min    Activity Tolerance Patient tolerated treatment well    Behavior During Therapy [facility] for tasks assessed/performed               Past Medical History:  Diagnosis Date   Back pain 04/22/2012   Bone spur    Bulging disc 04/22/2012   Degenerative disc disease    Osteoarthritis    Panic anxiety syndrome    Stroke ([place]) 10/2020   Taking multiple medications for chronic disease    Past Surgical History:  Procedure Laterality Date   CYSTECTOMY     head   HERNIA REPAIR Left 2006,2014   [facility]   TOE SURGERY Right 2007   Patient Active Problem List   Diagnosis Date Noted   Alterations of sensations following cerebrovascular accident 03/08/2021   Aphasia as late effect of cerebrovascular accident (CVA) 03/08/2021   Muscle spasm 02/22/2021   Acute non-recurrent frontal sinusitis 02/22/2021   Chronic pain syndrome 01/23/2021   Primary hypertension 01/23/2021   Nerve pain 01/23/2021   Erectile dysfunction due to diseases classified elsewhere 01/23/2021   Cognitive dysfunction 01/23/2021   Scrotal edema 01/23/2021   Elevated LDL cholesterol level 01/23/2021   History of stroke with residual deficit 01/23/2021   Primary insomnia 01/23/2021   Mouth pain 01/23/2021   Laceration of right hand without foreign body    Closed compression fracture of L1 vertebra ([place]) 05/24/2016   Lumbar stenosis with neurogenic claudication 11/01/2015   Chronic bilateral low back pain with right-sided  sciatica 11/08/2014   Hx of hemorrhoids 11/08/2014   AAA (abdominal aortic aneurysm) ([place]) 09/22/2014   Chronic pain associated with significant psychosocial dysfunction 09/22/2014   Panic disorder 09/22/2014   AB (asthmatic bronchitis) 08/17/2014   Anxiety 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 08/17/2014   Backache 08/17/2014   Lumbosacral spondylosis without myelopathy 08/17/2014   Disorder of male genital organs 08/17/2014   Brash 08/17/2014   Low back pain 08/17/2014   Tendon nodule 08/17/2014   Episodic paroxysmal anxiety disorder 08/17/2014   Hernia, inguinal, right 08/17/2014   Fast heart beat 08/17/2014   Illness 08/17/2014   Inguinal hernia 10/14/2012    ONSET DATE: 11/09/2020  REFERRING DIAG: CVA  THERAPY DIAG:  Muscle weakness (generalized)  Other lack of coordination  H/O ischemic left MCA stroke  Rationale for Evaluation and Treatment Rehabilitation  PERTINENT HISTORY: Pt. is a 51 y.o. who was diagnosed with a CVA on July 21st, 2022. Pt. completed several weeks of  inpatient [facility] After returning to home, Pt. sustained a fall in december of 2022, and was admitted to the hospital with COVID-19, and back pain from the fall. Since the most recent discharge, Pt. has been residing with the ex-wife until the Pt. is ready to return to independent living. Pt. PMHx includes: Bilateral LBP with right sided sciatica, Lumbosacral spondylosis myelopathy, closed compression Fx of L1. Pt. enjoys cooking, and riding motorcycles.  PRECAUTIONS: Fall  SUBJECTIVE: Pt reports doing well today.  PAIN:  Are you having pain? Yes: NPRS scale: 3/10 Pain location: low back Pain description: aching Aggravating factors: prolonged sitting or lying down Relieving factors: rest, heat, walking  [facility] OT Assessment        12/11/21 01/22/22 03/01/22             Observation/Other Assessments      Focus on Therapeutic Outcomes (FOTO)   46 50 49             Coordination       Right 9 Hole Peg Test unable  unable unable    Left 9 Hole Peg Test 30 sec  30 sec NT             Hand Function      Right Hand Grip (lbs) 35 (3rd setting) 30 lb (3rd);  41 lb (4th setting),  44 lbs (4th setting)    Right Hand Lateral Pinch 9 lbs   slipping 12 lbs (thumb slips) 16 lbs (multiple trials d/t thumb slips)    Right Hand 3 Point Pinch   unable to position thumb to produce 3 pt pinch Unable to position thumb to produce 3 point pinch unable    Left Hand Grip (lbs) 116       Left Hand Lateral Pinch 29 lbs       Left 3 point pinch 27 lbs         OBJECTIVE:  TODAY'S TREATMENT:  Therapeutic Exercise: Completed wrist maze R hand x3 reps, min vc to minimize compensatory proximal movements on the 3rd trial when pt was more fatigued.  Participated in place and hold for thumb radial abd, resisted lateral pinch pushing into foam dowel, active assisted palmar abd, and active assisted digit flex/ext with OT assisting 25% of the time to delay thumb closure over fist; x2 sets 7-10 reps each.  Performed active digit abd and active assisted add, working to strengthen intrinsics x2 sets 7 each. Facilitated 3 point pinch, pushing into foam dowel, with OT providing set up and stabilization of digits in 3 point pinch pattern while pt pinched into dowel for 3 sets 10 reps each.  Pt requires rest breaks between each set for above noted exercises.     Neuro re-ed: Facilitated 3 point pinch patterns working to place and remove ball pegs from pegboard. Min vc and visual cues to rest R forearm on table to isolate distal coordination from proximal movements.   PATIENT EDUCATION: Education details: HEP progression, grasp/release activities; wrist flex against gravity, lateral and 3 point pinch with foam dowel Person educated: Patient Education method: Explanation Education comprehension: verbalized and demonstrated understanding.      OT Short Term Goals -      OT SHORT TERM GOAL #1   Title Pt. wil  demonstrtae independence with HEPs    Baseline Eval: Pt. currently does not have one, 10th visit:  changes in HEP ongoing; 20th: instructed in putty exercises, changes ongoing; 40th: continual updates to HEP as pt progresses; 50th: continual updates; 01/22/22: continual updates; 03/01/22: continual updates   Time 6    Period Weeks    Status On-going    Target Date 03/05/22             OT Long Term Goals -       OT LONG TERM GOAL #1   Title Pt. will improve RUE strength by 2 mm grades to assist with ADLs, and  IADLs,    Baseline Eval: Right shoulder flexion, abduction 3/5, elbow flexion, extension wrist extension 3+/5, 10th visit: improving with RUE strength by not yet met goal; 20th visit: R shd flex/abd 4-, elbow flex/ext 4+, wrist flex 4-, wrist ext 4+; 40:  R shd flex/abd 4,  elbow flex/ext 4+, wrist flex 4, wrist ext 4+; 50th visit 8/22: R shoulder flex/abd 4/5, elbow flex/ext 5/5, wrist flex 4, wrist ext 4+; 01/22/22: shoulder flex/abd 4/5, elbow flex/ext 5/5, wrist flex 4, wrist ext 4+; 03/01/22: same as 01/22/22   Time 12    Period Weeks    Status On-going    Target Date 04/16/22      OT LONG TERM GOAL #2   Title Pt. will improve right grip by 10 lbs to prepare for firmly holding objects for IADLs.    Baseline Eval: R: 38#, L: 124# (In 3rd dynamometer slot); R 35# in 4th slot, 28# in 3rd slot, 40th: 47 in 4th slot; 50th: 12/11/21: 46# on 3rd slot; 01/22/22: 30# on 3rd slot; 03/01/22: 44# 4th slot   Time 12    Period Weeks    Status On-going    Target Date 04/16/22      OT LONG TERM GOAL #3   Title Pt. will improve right lateral pinch strength by 5# to assist with cutting food    Baseline Eval: R: 17, L: 25; 20th: R: 18; 40th: 15# fingers slipping on meter, 50th: 9# fingers slipping; 01/22/22: 12# fingers slipping; 03/01/22: 16#   Time 12    Period Weeks    Status On-going    Target Date 04/16/22      OT LONG TERM GOAL #4   Title Pt. will improve Right 3pt pinch by 2# to be  able to hold/open items for cooking    Baseline Eval: R: Pt. unable to engage thumb L: 29#; 20th: R unable; 40th:  unable, fingers slipping; 50th:  unable, fingers slipping; 01/22/22: fingers slipping but pt is now able to grasp a ball peg with 3 point pinch pattern; 03/01/22: same as 10/3 in addition to being able to pick up ball peg from pegboard then place peg back into pegboard with a 3 point pinch pattern.   Time 12    Period Weeks    Status On-going    Target Date 04/16/22      OT LONG TERM GOAL #5   Title Pt. will improve right hand [facility] skills to be able to independently manipulate buttons, and zippers.    Baseline Eval: Pt. has difficulty managing buttons,a nd zippers. 10th visit:  improving; 20th: unable to engage R hand effectively, performs 1 handed with L; 50th: performs L handed only; 01/22/22: pt can use R hand as a stabilizer with clothing fasteners with mod vc; 03/01/22: same as 01/22/22   Time 12    Period Weeks    Status On-going    Target Date 04/16/22      OT LONG TERM GOAL #6   Title Pt. will independently write his name    Baseline Eval: Pt. is unable to hold a pen; 20th: pt can grip PenAgain with R hand and can make marks on paper with difficulty, but not yet writing name; 40th:  continues to demonstrate difficulty with grasp of pen; 50th 12/11/21: With assist to set up hand on pen again, pt was able to write first name and initial of last name in 1 1/4" tall letters with fair legibility (pt wants to defer this goal,  states this is low on his list of priorities for now)   Time 12    Period Weeks    Status Defer   Target Date 04/16/22      OT LONG TERM GOAL #7   Title Pt. will improve FOTO score by 2 points to reflect funational improvement    Baseline Eval: FOTO score 46 with TR score 52; 20th: 45; 40th: 73;  50th: 46; 01/22/22: 50; 03/01/22: 49   Time 12    Period Weeks    Status On-going    Target Date 04/16/22             Plan -    Clinical Impression  Statement Completed wrist maze R hand x3 reps, min vc to minimize compensatory proximal movements on the 3rd trial when pt was more fatigued.  Pt continues to focus on intrinsic strengthening, pinch prehension patterns with lateral and 3 point pinch, and gripping items without tucking thumb.  Pt requires OT assist ~25% of the time to delay thumb closure to prevent thumb tucking under digits 2-5.  Pt continues to practice this when he reaches for the refrigerator door with his R hand.  Pt will continue to work towards neuro re-ed of right hand with emphasis on thumb movements to integrate into functional grasping and manipulation skills.    OT Occupational Profile and History Detailed Assessment- Review of Records and additional review of physical, cognitive, psychosocial history related to current functional performance    Occupational performance deficits (Please refer to evaluation for details): ADL's;IADL's;Education    Rehab Potential Good    Clinical Decision Making Several treatment options, min-mod task modification necessary    Comorbidities Affecting Occupational Performance: May have comorbidities impacting occupational performance    Modification or Assistance to Complete Evaluation  Min-Moderate modification of tasks or assist with assess necessary to complete eval    OT Frequency 2x / week    OT Duration 12 weeks    OT Treatment/Interventions Self-care/ADL training;DME and/or AE instruction;Therapeutic exercise;Ultrasound;Neuromuscular education;Therapeutic activities;Energy conservation;Moist Heat;Patient/family education;Splinting;Functional Mobility Training;Paraffin    Plan Neuro re-ed and therapeutic exercise    Consulted and Agree with Plan of Care Patient             Leta Speller, MS, OTR/L   Darleene Cleaver, OT 03/21/2022, 9:30 PM

## 2022-03-21 NOTE — Telephone Encounter (Signed)
Pt stated he would like to change his Rx back to celecoxib (CELEBREX) 200 MG capsule. Pt requests that the Rx be sent to [facility] [place], [place] - [address] Phone: [phone]  Fax:[phone]

## 2022-03-22 ENCOUNTER — Other Ambulatory Visit: Payer: Self-pay | Admitting: Physician Assistant

## 2022-03-22 DIAGNOSIS — G8929 Other chronic pain: Secondary | ICD-10-CM

## 2022-03-22 MED ORDER — CELECOXIB 100 MG PO CAPS: 100.0000 mg | ORAL_CAPSULE | Freq: Two times a day (BID) | ORAL | 1 refills | Status: DC

## 2022-03-22 NOTE — Telephone Encounter (Signed)
done

## 2022-03-24 ENCOUNTER — Telehealth: Payer: Self-pay | Admitting: Family Medicine

## 2022-03-24 DIAGNOSIS — I6930 Unspecified sequelae of cerebral infarction: Secondary | ICD-10-CM

## 2022-03-24 DIAGNOSIS — G8929 Other chronic pain: Secondary | ICD-10-CM

## 2022-03-25 NOTE — Telephone Encounter (Signed)
Rx was sent to mail order pharmacy for lower dose on 03/22/22 #180/1.   Requested Prescriptions  Pending Prescriptions Disp Refills   celecoxib (CELEBREX) 200 MG capsule [Pharmacy Med Name: CELECOXIB 200 MG CAPSULE] 60 capsule 0    Sig: TAKE 1 CAPSULE BY MOUTH TWICE A DAY     Analgesics:  COX2 Inhibitors Failed - 03/24/2022 11:21 AM      Failed - Manual Review: Labs are only required if the patient has taken medication for more than 8 weeks.      Passed - HGB in normal range and within 360 days    Hemoglobin  Date Value Ref Range Status  12/27/2021 14.6 13.0 - 17.7 g/dL Final         Passed - Cr in normal range and within 360 days    Creatinine  Date Value Ref Range Status  11/14/2013 0.85 0.60 - 1.30 mg/dL Final   Creatinine, Ser  Date Value Ref Range Status  12/27/2021 0.84 0.76 - 1.27 mg/dL Final         Passed - HCT in normal range and within 360 days    Hematocrit  Date Value Ref Range Status  12/27/2021 42.2 37.5 - 51.0 % Final         Passed - AST in normal range and within 360 days    AST  Date Value Ref Range Status  12/27/2021 19 0 - 40 IU/L Final   SGOT(AST)  Date Value Ref Range Status  11/14/2013 20 15 - 37 Unit/L Final         Passed - ALT in normal range and within 360 days    ALT  Date Value Ref Range Status  12/27/2021 20 0 - 44 IU/L Final   SGPT (ALT)  Date Value Ref Range Status  11/14/2013 35 U/L Final    Comment:    14-63 NOTE: New Reference Range 11/09/13          Passed - eGFR is 30 or above and within 360 days    EGFR (African American)  Date Value Ref Range Status  11/14/2013 >60  Final   GFR calc Af Amer  Date Value Ref Range Status  02/15/2020 121 >59 mL/min/1.73 Final    Comment:    **In accordance with recommendations from the NKF-ASN Task force,**   [facility] is in the process of updating its eGFR calculation to the   2021 CKD-EPI creatinine equation that estimates kidney function   without a race variable.    EGFR  (Non-African Amer.)  Date Value Ref Range Status  11/14/2013 >60  Final    Comment:    eGFR values <70m/min/1.73 m2 may be an indication of chronic kidney disease (CKD). Calculated eGFR is useful in patients with stable renal function. The eGFR calculation will not be reliable in acutely ill patients when serum creatinine is changing rapidly. It is not useful in  patients on dialysis. The eGFR calculation may not be applicable to patients at the low and high extremes of body sizes, pregnant women, and vegetarians.    GFR, Estimated  Date Value Ref Range Status  04/05/2021 >60 >60 mL/min Final    Comment:    (NOTE) Calculated using the CKD-EPI Creatinine Equation (2021)    eGFR  Date Value Ref Range Status  12/27/2021 106 >59 mL/min/1.73 Final         Passed - Patient is not pregnant      Passed - Valid encounter within last 12 months  Recent Outpatient Visits           2 months ago Primary hypertension   [facility] Thedore Mins, [place], PA-C   7 months ago History of stroke with residual deficit   [facility] Thedore Mins, [place], PA-C   10 months ago Alterations of sensations following cerebrovascular accident   [facility] Thedore Mins, [place], PA-C   1 year ago Alterations of sensations following cerebrovascular accident   [facility] [name], PA-C   1 year ago Muscle spasm   [facility] Gwyneth Sprout, FNP

## 2022-03-26 ENCOUNTER — Ambulatory Visit: Payer: Medicare Other

## 2022-03-26 ENCOUNTER — Other Ambulatory Visit: Payer: Self-pay | Admitting: Family Medicine

## 2022-03-26 ENCOUNTER — Ambulatory Visit: Payer: Medicare Other | Attending: Physician Assistant

## 2022-03-26 DIAGNOSIS — M6281 Muscle weakness (generalized): Secondary | ICD-10-CM

## 2022-03-26 DIAGNOSIS — Z8673 Personal history of transient ischemic attack (TIA), and cerebral infarction without residual deficits: Secondary | ICD-10-CM | POA: Insufficient documentation

## 2022-03-26 DIAGNOSIS — G8929 Other chronic pain: Secondary | ICD-10-CM | POA: Diagnosis present

## 2022-03-26 DIAGNOSIS — M545 Low back pain, unspecified: Secondary | ICD-10-CM | POA: Insufficient documentation

## 2022-03-26 DIAGNOSIS — R278 Other lack of coordination: Secondary | ICD-10-CM

## 2022-03-26 DIAGNOSIS — E7800 Pure hypercholesterolemia, unspecified: Secondary | ICD-10-CM

## 2022-03-26 DIAGNOSIS — R262 Difficulty in walking, not elsewhere classified: Secondary | ICD-10-CM | POA: Insufficient documentation

## 2022-03-26 DIAGNOSIS — R2681 Unsteadiness on feet: Secondary | ICD-10-CM

## 2022-03-26 DIAGNOSIS — I10 Essential (primary) hypertension: Secondary | ICD-10-CM

## 2022-03-26 DIAGNOSIS — R269 Unspecified abnormalities of gait and mobility: Secondary | ICD-10-CM | POA: Diagnosis present

## 2022-03-26 NOTE — Therapy (Signed)
[place] MA[facility] SERVICES [address] [place], [place], [address] Phone: [phone]   Fax:  [phone]  Patient Details  Name: David Reeves MRN: [id number] Date of Birth: 11/08/1970 Referring Provider:  [name], PA-C  Encounter Date: 03/26/2022   OUTPATIENT PHYSICAL THERAPY TREATMENT NOTE      Patient Name: David Reeves MRN: [id number] DOB:03-06-1971, 51 y.o., male Today's Date: 03/26/2022  PCP: [name], PA-C REFERRING PROVIDER: [name], PA-C   PT End of Session - 03/26/22 0845     Visit Number 65    Number of Visits 25    Date for PT Re-Evaluation 04/26/22    Authorization Type [employer]    Authorization Time Period 02/01/22-04/26/22    Progress Note Due on Visit 30    PT Start Time 0844    PT Stop Time 0924    PT Time Calculation (min) 40 min    Equipment Utilized During Treatment Gait belt    Activity Tolerance Patient tolerated treatment well;No increased pain    Behavior During Therapy [facility] for tasks assessed/performed                  Past Medical History:  Diagnosis Date   Back pain 04/22/2012   Bone spur    Bulging disc 04/22/2012   Degenerative disc disease    Osteoarthritis    Panic anxiety syndrome    Stroke ([place]) 10/2020   Taking multiple medications for chronic disease    Past Surgical History:  Procedure Laterality Date   CYSTECTOMY     head   HERNIA REPAIR Left 2006,2014   [facility]   TOE SURGERY Right 2007   Patient Active Problem List   Diagnosis Date Noted   Alterations of sensations following cerebrovascular accident 03/08/2021   Aphasia as late effect of cerebrovascular accident (CVA) 03/08/2021   Muscle spasm 02/22/2021   Acute non-recurrent frontal sinusitis 02/22/2021   Chronic pain syndrome 01/23/2021   Primary hypertension 01/23/2021   Nerve pain 01/23/2021   Erectile dysfunction due to diseases classified elsewhere 01/23/2021    Cognitive dysfunction 01/23/2021   Scrotal edema 01/23/2021   Elevated LDL cholesterol level 01/23/2021   History of stroke with residual deficit 01/23/2021   Primary insomnia 01/23/2021   Mouth pain 01/23/2021   Laceration of right hand without foreign body    Closed compression fracture of L1 vertebra ([place]) 05/24/2016   Lumbar stenosis with neurogenic claudication 11/01/2015   Chronic bilateral low back pain with right-sided sciatica 11/08/2014   Hx of hemorrhoids 11/08/2014   AAA (abdominal aortic aneurysm) ([place]) 09/22/2014   Chronic pain associated with significant psychosocial dysfunction 09/22/2014   Panic disorder 09/22/2014   AB (asthmatic bronchitis) 08/17/2014   Anxiety 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 08/17/2014   Backache 08/17/2014   Lumbosacral spondylosis without myelopathy 08/17/2014   Disorder of male genital organs 08/17/2014   Brash 08/17/2014   Low back pain 08/17/2014   Tendon nodule 08/17/2014   Episodic paroxysmal anxiety disorder 08/17/2014   Hernia, inguinal, right 08/17/2014   Fast heart beat 08/17/2014   Illness 08/17/2014   Inguinal hernia 10/14/2012    REFERRING DIAG: History of stroke with residual deficit, chronic bilateral low back pain with right-sided sciatica, Lumbosacral spondylosis without myelopathy THERAPY DIAG:  Chronic bilateral low back pain without sciatica  Muscle weakness (generalized)  Unsteadiness on feet  Other lack of coordination  Rationale for Evaluation and Treatment Rehabilitation  PERTINENT HISTORY: Pt. is  a 51 y.o. who was diagnosed with a CVA on July 21st, 2022. Pt. completed several weeks of inpatient rehab at [facility]. After returning to home. He was discharged in late August/early September and recieved home health PT. Pt. sustained a fall in december of 2022, and was admitted to the hospital with COVID-19, and Chronic back pain. He reports chronic back pain syndrome since 2012. Since the most recent discharge,  Pt. has been residing with the ex-wife until the Pt. is ready to return to independent living. He did recieve 2 weeks of home health after discharge in December 2022. He is now being referred to outpatient PT to address weakness from stroke and improve fine motor movement. He reports rarely getting numbness/tingling in RLE. PMHx includes: Bilateral LBP with right sided sciatica, Lumbosacral spondylosis myelopathy, closed compression Fx of L1. Has chronic insomnia. osteoarthritis, Panic anxiety syndrome (taking medication) Pt. enjoys cooking, and riding motorcycles. Patient is going to [facility] spine center for Epidural spinal injections on 06/07/21;  PRECAUTIONS: Fall, Spinal Brace wears daily, approximately 25%, when exercising; semi-rigid lumbar brace  SUBJECTIVE: Pt reports pain level is currently a 3/10. Pt reports injection yesterday for his pain. He forgot his pain medication this morning. Pt reports no issues with balance, denies any stumbles/falls. No other concerns reported   PAIN:  Are you having pain? Yes: NPRS scale: 3/10 Pain location: bilat low back Pain description: dull ache; always constant,  Aggravating factors: worse with sitting,  Relieving factors: heat helps temporarily, pain pills/pain patch   TODAY'S TREATMENT 03/26/22  TherEx Heat applied to low back while he performs the following (skin checked is WNL prior to application and upon removal of heat, no adverse reaction, reports heat feels good to low back):  On mat table- LTRs 20x  Singe leg glute bridge (some assist from contralat LE, minimized today with cuing) - 15x each LE. Pt rates easy SLR with 5# AW donned 20x each LE. Rates medium   Standing march 2x10 alt LE  Standing hip abd 10x each LE  Heel raise 20x BLE Comments: no increase in LBP  Leg press: 70# 10x 100# 8x 130# 5x. Pt rates medium 145# 10x. Pt rates medium Pt fatigued   NMR:  Stepping over hurdle FWD/BCKWD 10x for each in each  direction Stepping over hurdle LTL 10x 2 sets each direction   Reports no increase in back pain with interventions, pain remains at baseline  PATIENT EDUCATION:  Education details: Pt educated throughout session about proper posture and technique with exercises. Improved exercise technique, movement at target joints, use of target muscles after min to mod verbal, visual, tactile cues.   Person educated: Patient Education method: Explanation, Demonstration, Tactile cues, and Verbal cues Education comprehension: verbalized understanding, returned demonstration, verbal cues required, tactile cues required, and needs further education   HOME EXERCISE PROGRAM: No updates on this date pt to continue HEP as previously given     PT Short Term Goals -       PT SHORT TERM GOAL #1   Title Patient will be adherent to HEP at least 3x a week to improve functional strength and balance for better safety at home.    Baseline 4/4: doing them 1-2x a week; 08/20/2021=Patient reports performing his low back stretching and no questions currently HEP. 10/24/21: doing HEP 4x a week   Time 4    Period Weeks    Status Achieved    Target Date 08/21/21      PT SHORT  TERM GOAL #2   Title Patient (< 87 years old) will complete five times sit to stand test in < 15 seconds indicating an increased LE strength and improved balance.    Baseline 4/4: 17.85 sec with arms across chest; 08/20/2021= 17.6 sec , 7/5: 12.85 sec   Time 4    Period Weeks    Status Achieved;    Target Date 10/01/21              PT Long Term Goals -  TARGET DATE FOR GOALS 04/26/2022      PT LONG TERM GOAL #1   Title Patient will increase six minute walk test distance to >1000 for progression to community ambulator and improve gait ability    Baseline 4/4: 1115 feet , 7/5: 1080 feet;    Time 4    Period Weeks    Status Achieved    Target Date 08/21/21      PT LONG TERM GOAL #2   Title Patient will ascend/descend 8 stairs without rail  assist independently without loss of balance to improve ability to get in/out of home.    Baseline 4/4: requires 1 rail assist; 08/20/2021- Patient demonstrated steps without railing using reciprocal steps today. 7/5: ascend/descend 6 steps reciprocally without rail, but required CGA for safety when descending; 7/25: Able to complete without UE support and good balance going up steps, unsafe descending steps without UE support. Required min assist and UE support to prevent LOB descending.; 8/15: requires UUE support to descend and close CGA due to; unsteadiness; 9/26: ascend without UE support, recip pattern; descend with UUE support, recip stepping, but still doesn't feel steady yet with descending; 10/13 able to demo recip pattern ascending hands free, required instance of UE support with descending, recip pattern due to heel getting caught under step; 11/14: deferred; 11/21: mod assist with descending due to unsteadiness, able to complete without UE support ascending   Time 12*corrected   Period Weeks    Status Partially met   Target Date      PT LONG TERM GOAL #3   Title Patient will increase BLE gross strength to 4+/5 as to improve functional strength for independent gait, increased standing tolerance and increased ADL ability.    Baseline 4/4: not formally assessed; 08/20/2021= 4+/5 with Right hip flex/knee ext/flex; Right hip abd= 4/5, 7/5: 4+/5 hip    Time 12    Period Weeks    Status Achieved;    Target Date 11/12/21      PT LONG TERM GOAL #4   Title Patient will improve FOTO score to >50% to indicate improved functional mobility with less pain with ADLs.    Baseline 4/4: 35%; 08/20/2021= 42% 7/5: 47%; 7:25:recently addressed; 11/30/21: 35; 9/26: 54%    Time 12    Period Weeks    Status MET   Target Date      PT LONG TERM GOAL #5   Title Patient will report a worst pain of 4/10 in low back over last week to indicate improved tolerance with ADLs.    Baseline 4/4: 7/10; 08/20/2021= 4/10 , 7/5:  worst 7/10 in low back; 7/25: 6/10 ; 8/15: pt reports 6/10; 9/26: 4/10   Time 12    Period Weeks    Status Achieved    Target Date 02/05/2022      Additional Long Term Goals   Additional Long Term Goals Yes      PT LONG TERM GOAL #6   Title Pt will  increase 10MWT by at least 0.13 m/s in order to demonstrate clinically significant improvement in community ambulation    Baseline 08/20/2021=0.94 m/s 7/5: 1.5 m/s   Time 12    Period Weeks    Status Achieved;    Target Date 11/12/21      PT LONG TERM GOAL #7   Title Patient will demonstrate improved static standing balance as seen by ability to single leg stance on right LE > 12 sec consistently for optimal balance on level and unlevel surfaces.    Baseline 08/20/2021= 4 sec SLS on right , 7/5: 4-5 sec inconsistent; 7/25 able to achieve 15 sec after multiple attempts of 4-5 sec holds; 8/15: LLE 15 sec, RLE 8 sec; 9/26: LLE 30 sec, RLE 17 seconds   Time 12    Period Weeks    Status MET   Target Date 02/05/2022    PT LONG TERM GOAL #8  Title Pt will report ability to ambulate for at least 15 minutes without an increase in his LBP in order to improve community participation and ease with ADLs  Baseline 9/26: increase in pain at 15 min mark; 10/13: reports about 20 minutes.   Time 3   Period Weeks   Status MET  Target Date 02/05/2022        PT LONG TERM GOAL #9  Title Pt will report ability to ambulate for at least 45 minutes without an increase in his LBP in order to improve community participation and ease with ADLs  Baseline 9/26: increase in pain at 15 min mark; 10/13: reports about 20 minutes. 11/14: deferred; 11/21: Unable to complete 45 min gait without pain, but does report this has improved. Thinks he can do 30 min before pain increase.  Time 3   Period Weeks   Status ONGOING  Target Date        Plan -     Clinical Impression Statement Pt able to perform un-weighted standing hip abduction today without increased LBP. This  is an improvement from prior sessions where pt experienced increased pain with this intervention, indicating over improvement in functional mobility and activity tolerance. While pt shows progress, he still has difficult with balance when performing foot clearance interventions. The pt will continue to benefit from skilled therapy to address remaining deficits in order to improve overall QoL and return to PLOF.     Personal Factors and Comorbidities Comorbidity 3+;Past/Current Experience;Time since onset of injury/illness/exacerbation    Comorbidities PMHx includes: Bilateral LBP with right sided sciatica, Lumbosacral spondylosis myelopathy, closed compression Fx of L1. Has chronic insomnia. osteoarthritis, Panic anxiety syndrome (taking medication)    Examination-Activity Limitations Bend;Carry;Locomotion Level;Sit;Squat;Stairs;Stand;Transfers    Examination-Participation Restrictions Cleaning;Community Activity;Driving;Laundry;Meal Prep;Occupation;Shop;Volunteer;Yard Work    Stability/Clinical Decision Making Stable/Uncomplicated    Rehab Potential Good    PT Frequency 2x / week    PT Duration 12 weeks    PT Treatment/Interventions Cryotherapy;Electrical Stimulation;Moist Heat;Traction;Gait training;Stair training;Functional mobility training;Therapeutic activities;Therapeutic exercise;Balance training;Neuromuscular re-education;Patient/family education;Manual techniques;Passive range of motion;Dry needling;Energy conservation    PT Next Visit Plan  Progress core stabilization, Manual therapy for low back and LE ROM, instruct in balance HEP, continue plan       Consulted and Agree with Plan of Care Patient              9:53 AM, 03/26/22 Ricard Dillon PT, DPT   Physical Therapist - [facility]  Outpatient Physical Therapy- [place]  [place] MAIN [facility] SERVICES [place], [place],  [address] Phone: [phone]   Fax:  [phone]

## 2022-03-26 NOTE — Therapy (Signed)
OUTPATIENT OCCUPATIONAL THERAPY NEURO  TREATMENT NOTE   Patient Name: David Reeves MRN: [id number] DOB:04-02-1971, 51 y.o., male Today's Date: 03/26/2022  PCP: [name], PA-C REFERRING PROVIDER: [name], PA-C   OT End of Session - 03/26/22 1054     Visit Number 76    Number of Visits 67    Date for OT Re-Evaluation 04/16/22    Authorization Type Progress reporting period starting 03/01/22    OT Start Time 0930    OT Stop Time 1015    OT Time Calculation (min) 45 min    Activity Tolerance Patient tolerated treatment well    Behavior During Therapy [facility] for tasks assessed/performed               Past Medical History:  Diagnosis Date   Back pain 04/22/2012   Bone spur    Bulging disc 04/22/2012   Degenerative disc disease    Osteoarthritis    Panic anxiety syndrome    Stroke ([place]) 10/2020   Taking multiple medications for chronic disease    Past Surgical History:  Procedure Laterality Date   CYSTECTOMY     head   HERNIA REPAIR Left 2006,2014   [facility]   TOE SURGERY Right 2007   Patient Active Problem List   Diagnosis Date Noted   Alterations of sensations following cerebrovascular accident 03/08/2021   Aphasia as late effect of cerebrovascular accident (CVA) 03/08/2021   Muscle spasm 02/22/2021   Acute non-recurrent frontal sinusitis 02/22/2021   Chronic pain syndrome 01/23/2021   Primary hypertension 01/23/2021   Nerve pain 01/23/2021   Erectile dysfunction due to diseases classified elsewhere 01/23/2021   Cognitive dysfunction 01/23/2021   Scrotal edema 01/23/2021   Elevated LDL cholesterol level 01/23/2021   History of stroke with residual deficit 01/23/2021   Primary insomnia 01/23/2021   Mouth pain 01/23/2021   Laceration of right hand without foreign body    Closed compression fracture of L1 vertebra ([place]) 05/24/2016   Lumbar stenosis with neurogenic claudication 11/01/2015   Chronic bilateral low back pain with right-sided  sciatica 11/08/2014   Hx of hemorrhoids 11/08/2014   AAA (abdominal aortic aneurysm) ([place]) 09/22/2014   Chronic pain associated with significant psychosocial dysfunction 09/22/2014   Panic disorder 09/22/2014   AB (asthmatic bronchitis) 08/17/2014   Anxiety 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 08/17/2014   Backache 08/17/2014   Lumbosacral spondylosis without myelopathy 08/17/2014   Disorder of male genital organs 08/17/2014   Brash 08/17/2014   Low back pain 08/17/2014   Tendon nodule 08/17/2014   Episodic paroxysmal anxiety disorder 08/17/2014   Hernia, inguinal, right 08/17/2014   Fast heart beat 08/17/2014   Illness 08/17/2014   Inguinal hernia 10/14/2012    ONSET DATE: 11/09/2020  REFERRING DIAG: CVA  THERAPY DIAG:  Muscle weakness (generalized)  Other lack of coordination  H/O ischemic left MCA stroke  Rationale for Evaluation and Treatment Rehabilitation  PERTINENT HISTORY: Pt. is a 50 y.o. who was diagnosed with a CVA on July 21st, 2022. Pt. completed several weeks of  inpatient rehab at [facility] After returning to home, Pt. sustained a fall in december of 2022, and was admitted to the hospital with COVID-19, and back pain from the fall. Since the most recent discharge, Pt. has been residing with the ex-wife until the Pt. is ready to return to independent living. Pt. PMHx includes: Bilateral LBP with right sided sciatica, Lumbosacral spondylosis myelopathy, closed compression Fx of L1. Pt. enjoys cooking, and riding motorcycles.  PRECAUTIONS: Fall  SUBJECTIVE: Pt reports he's been up since 3:15 a.m. this morning.  Pt stated that it was a record that he had slept 3 hours in a row, when usually it's 2. PAIN:  Are you having pain? Yes: NPRS scale: 2/10 Pain location: low back Pain description: aching Aggravating factors: prolonged sitting or lying down Relieving factors: rest, heat, walking  [facility] OT Assessment        12/11/21 01/22/22 03/01/22              Observation/Other Assessments      Focus on Therapeutic Outcomes (FOTO)   46 50 49             Coordination      Right 9 Hole Peg Test unable  unable unable    Left 9 Hole Peg Test 30 sec  30 sec NT             Hand Function      Right Hand Grip (lbs) 35 (3rd setting) 30 lb (3rd);  41 lb (4th setting),  44 lbs (4th setting)    Right Hand Lateral Pinch 9 lbs   slipping 12 lbs (thumb slips) 16 lbs (multiple trials d/t thumb slips)    Right Hand 3 Point Pinch   unable to position thumb to produce 3 pt pinch Unable to position thumb to produce 3 point pinch unable    Left Hand Grip (lbs) 116       Left Hand Lateral Pinch 29 lbs       Left 3 point pinch 27 lbs         OBJECTIVE:  TODAY'S TREATMENT:  Therapeutic Exercise: Attempted wrist maze R hand; pt rested 2x and was unable to complete a full rep, despite rest breaks. Used small piece of putty to perform thumb IP extension x3 sets 10 reps with very light resistance, working to strengthen extensors to reduce IP flexion when attempting to pick up small items (pegs) when using 3 point pinch pattern.  Instructed pt in using L hand to provide R thumb with slight resistance for thumb IP ext to carry over this exercise at home.  Good return demo.  Practiced lateral pinch into putty with OT providing tactile cue and min A to minimize IP flexion; completed 3 sets 10 reps each.  Neuro re-ed: Practiced 3 point pinch patterns with minimal resistance, pulling jumbo peg and ball peg out of theraputty.  Pt consistently made good attempts, but did require min A to maintain 3 point pinch pattern to remove pegs from resistive putty.  Practiced rolling a peg through putty toward palm using fingertips, using IF and LF together, then LF individually.  Facilitated 3 point pinch patterns working to place and remove ball pegs from pegboard without resistance. Min vc and visual cues to rest R forearm on table to isolate distal coordination from proximal movements.   Also required intermittent positioning of LF to rest on peg to form a 3 point pinch pattern with more stabilization.   PATIENT EDUCATION: Education details: HEP progression, grasp/release activities; wrist flex against gravity, lateral and 3 point pinch with foam dowel Person educated: Patient Education method: Explanation Education comprehension: verbalized and demonstrated understanding.      OT Short Term Goals -      OT SHORT TERM GOAL #1   Title Pt. wil demonstrtae independence with HEPs    Baseline Eval: Pt. currently does not have one, 10th visit:  changes in HEP ongoing; 20th:  instructed in putty exercises, changes ongoing; 40th: continual updates to HEP as pt progresses; 50th: continual updates; 01/22/22: continual updates; 03/01/22: continual updates   Time 6    Period Weeks    Status On-going    Target Date 03/05/22             OT Long Term Goals -       OT LONG TERM GOAL #1   Title Pt. will improve RUE strength by 2 mm grades to assist with ADLs, and IADLs,    Baseline Eval: Right shoulder flexion, abduction 3/5, elbow flexion, extension wrist extension 3+/5, 10th visit: improving with RUE strength by not yet met goal; 20th visit: R shd flex/abd 4-, elbow flex/ext 4+, wrist flex 4-, wrist ext 4+; 40:  R shd flex/abd 4,  elbow flex/ext 4+, wrist flex 4, wrist ext 4+; 50th visit 8/22: R shoulder flex/abd 4/5, elbow flex/ext 5/5, wrist flex 4, wrist ext 4+; 01/22/22: shoulder flex/abd 4/5, elbow flex/ext 5/5, wrist flex 4, wrist ext 4+; 03/01/22: same as 01/22/22   Time 12    Period Weeks    Status On-going    Target Date 04/16/22      OT LONG TERM GOAL #2   Title Pt. will improve right grip by 10 lbs to prepare for firmly holding objects for IADLs.    Baseline Eval: R: 38#, L: 124# (In 3rd dynamometer slot); R 35# in 4th slot, 28# in 3rd slot, 40th: 47 in 4th slot; 50th: 12/11/21: 46# on 3rd slot; 01/22/22: 30# on 3rd slot; 03/01/22: 44# 4th slot   Time 12    Period Weeks     Status On-going    Target Date 04/16/22      OT LONG TERM GOAL #3   Title Pt. will improve right lateral pinch strength by 5# to assist with cutting food    Baseline Eval: R: 17, L: 25; 20th: R: 18; 40th: 15# fingers slipping on meter, 50th: 9# fingers slipping; 01/22/22: 12# fingers slipping; 03/01/22: 16#   Time 12    Period Weeks    Status On-going    Target Date 04/16/22      OT LONG TERM GOAL #4   Title Pt. will improve Right 3pt pinch by 2# to be able to hold/open items for cooking    Baseline Eval: R: Pt. unable to engage thumb L: 29#; 20th: R unable; 40th:  unable, fingers slipping; 50th:  unable, fingers slipping; 01/22/22: fingers slipping but pt is now able to grasp a ball peg with 3 point pinch pattern; 03/01/22: same as 10/3 in addition to being able to pick up ball peg from pegboard then place peg back into pegboard with a 3 point pinch pattern.   Time 12    Period Weeks    Status On-going    Target Date 04/16/22      OT LONG TERM GOAL #5   Title Pt. will improve right hand [facility] skills to be able to independently manipulate buttons, and zippers.    Baseline Eval: Pt. has difficulty managing buttons,a nd zippers. 10th visit:  improving; 20th: unable to engage R hand effectively, performs 1 handed with L; 50th: performs L handed only; 01/22/22: pt can use R hand as a stabilizer with clothing fasteners with mod vc; 03/01/22: same as 01/22/22   Time 12    Period Weeks    Status On-going    Target Date 04/16/22      OT LONG TERM GOAL #6  Title Pt. will independently write his name    Baseline Eval: Pt. is unable to hold a pen; 20th: pt can grip PenAgain with R hand and can make marks on paper with difficulty, but not yet writing name; 40th:  continues to demonstrate difficulty with grasp of pen; 50th 12/11/21: With assist to set up hand on pen again, pt was able to write first name and initial of last name in 1 1/4" tall letters with fair legibility (pt wants to defer this goal,  states this is low on his list of priorities for now)   Time 12    Period Weeks    Status Defer   Target Date 04/16/22      OT LONG TERM GOAL #7   Title Pt. will improve FOTO score by 2 points to reflect funational improvement    Baseline Eval: FOTO score 46 with TR score 52; 20th: 45; 40th: 44;  50th: 46; 01/22/22: 50; 03/01/22: 49   Time 12    Period Weeks    Status On-going    Target Date 04/16/22             Plan -    Clinical Impression Statement Pt required frequent rest breaks with all activities this date.  Pt managed 1/2 of 1 rep on the wrist maze today before needing to move on d/t unsuccessful attempts despite rest breaks.  Focus on 3 point pinch patterns using increased resistance by removing jumbo peg from putty.  Pt required min A d/t thumb slipping d/t thumb IP flexion when applying any pressure to an object, causing pt to lose stability from 3 point pinch pattern.  Focused on IP extension exercises for better thumb stabilization during lateral and 3 point pinch patterns.  Pt practiced pressing into putty with OT blocking pt from thumb IP flexion during lateral pinch exercises.  Pt will continue to work towards neuro re-ed of right hand with emphasis on thumb movements to integrate into functional grasping and manipulation skills.    OT Occupational Profile and History Detailed Assessment- Review of Records and additional review of physical, cognitive, psychosocial history related to current functional performance    Occupational performance deficits (Please refer to evaluation for details): ADL's;IADL's;Education    Rehab Potential Good    Clinical Decision Making Several treatment options, min-mod task modification necessary    Comorbidities Affecting Occupational Performance: May have comorbidities impacting occupational performance    Modification or Assistance to Complete Evaluation  Min-Moderate modification of tasks or assist with assess necessary to complete eval     OT Frequency 2x / week    OT Duration 12 weeks    OT Treatment/Interventions Self-care/ADL training;DME and/or AE instruction;Therapeutic exercise;Ultrasound;Neuromuscular education;Therapeutic activities;Energy conservation;Moist Heat;Patient/family education;Splinting;Functional Mobility Training;Paraffin    Plan Neuro re-ed and therapeutic exercise    Consulted and Agree with Plan of Care Patient             Leta Speller, MS, OTR/L   Darleene Cleaver, OT 03/26/2022, 1:19 PM

## 2022-03-27 ENCOUNTER — Other Ambulatory Visit: Payer: Self-pay | Admitting: Physician Assistant

## 2022-03-27 MED ORDER — CELECOXIB 100 MG PO CAPS: 100.0000 mg | ORAL_CAPSULE | Freq: Two times a day (BID) | ORAL | 0 refills | Status: DC

## 2022-03-27 NOTE — Telephone Encounter (Signed)
Ok sent!

## 2022-03-27 NOTE — Telephone Encounter (Signed)
Spoke with Pt. His prescription is being processed through mail order. He is requesting a 5 day prescription be sent to [facility] on [address].

## 2022-03-27 NOTE — Telephone Encounter (Signed)
Called PT and left message to return call to our office.

## 2022-03-27 NOTE — Telephone Encounter (Signed)
done

## 2022-03-28 ENCOUNTER — Encounter: Payer: Self-pay | Admitting: Physical Therapy

## 2022-03-28 ENCOUNTER — Ambulatory Visit: Payer: Medicare Other

## 2022-03-28 ENCOUNTER — Ambulatory Visit: Payer: Medicare Other | Admitting: Physical Therapy

## 2022-03-28 DIAGNOSIS — Z8673 Personal history of transient ischemic attack (TIA), and cerebral infarction without residual deficits: Secondary | ICD-10-CM

## 2022-03-28 DIAGNOSIS — R269 Unspecified abnormalities of gait and mobility: Secondary | ICD-10-CM

## 2022-03-28 DIAGNOSIS — M6281 Muscle weakness (generalized): Secondary | ICD-10-CM

## 2022-03-28 DIAGNOSIS — R262 Difficulty in walking, not elsewhere classified: Secondary | ICD-10-CM

## 2022-03-28 DIAGNOSIS — R2681 Unsteadiness on feet: Secondary | ICD-10-CM

## 2022-03-28 DIAGNOSIS — R278 Other lack of coordination: Secondary | ICD-10-CM

## 2022-03-28 DIAGNOSIS — M545 Low back pain, unspecified: Secondary | ICD-10-CM | POA: Diagnosis not present

## 2022-03-28 DIAGNOSIS — G8929 Other chronic pain: Secondary | ICD-10-CM

## 2022-03-28 NOTE — Therapy (Signed)
[place] MAIN [facility] SERVICES [address] [place], [place], [address] Phone: [phone]   Fax:  [phone]  Patient Details  Name: David Reeves MRN: [id number] Date of Birth: Jul 25, 1970 Referring Provider:  [name], PA-C  Encounter Date: 03/28/2022   OUTPATIENT PHYSICAL THERAPY TREATMENT NOTE      Patient Name: David Reeves MRN: [id number] DOB:Mar 01, 1971, 51 y.o., male Today's Date: 03/28/2022  PCP: [name], PA-C REFERRING PROVIDER: [name], PA-C   PT End of Session - 03/28/22 1206     Visit Number 40    Number of Visits 86    Date for PT Re-Evaluation 04/26/22    Authorization Type [employer]    Authorization Time Period 02/01/22-04/26/22    Progress Note Due on Visit 60    PT Start Time 0846    PT Stop Time 0929    PT Time Calculation (min) 43 min    Equipment Utilized During Treatment Gait belt    Activity Tolerance Patient tolerated treatment well;No increased pain    Behavior During Therapy [facility] for tasks assessed/performed                   Past Medical History:  Diagnosis Date   Back pain 04/22/2012   Bone spur    Bulging disc 04/22/2012   Degenerative disc disease    Osteoarthritis    Panic anxiety syndrome    Stroke ([place]) 10/2020   Taking multiple medications for chronic disease    Past Surgical History:  Procedure Laterality Date   CYSTECTOMY     head   HERNIA REPAIR Left 2006,2014   [facility]   TOE SURGERY Right 2007   Patient Active Problem List   Diagnosis Date Noted   Alterations of sensations following cerebrovascular accident 03/08/2021   Aphasia as late effect of cerebrovascular accident (CVA) 03/08/2021   Muscle spasm 02/22/2021   Acute non-recurrent frontal sinusitis 02/22/2021   Chronic pain syndrome 01/23/2021   Primary hypertension 01/23/2021   Nerve pain 01/23/2021   Erectile dysfunction due to diseases classified elsewhere 01/23/2021    Cognitive dysfunction 01/23/2021   Scrotal edema 01/23/2021   Elevated LDL cholesterol level 01/23/2021   History of stroke with residual deficit 01/23/2021   Primary insomnia 01/23/2021   Mouth pain 01/23/2021   Laceration of right hand without foreign body    Closed compression fracture of L1 vertebra ([place]) 05/24/2016   Lumbar stenosis with neurogenic claudication 11/01/2015   Chronic bilateral low back pain with right-sided sciatica 11/08/2014   Hx of hemorrhoids 11/08/2014   AAA (abdominal aortic aneurysm) ([place]) 09/22/2014   Chronic pain associated with significant psychosocial dysfunction 09/22/2014   Panic disorder 09/22/2014   AB (asthmatic bronchitis) 08/17/2014   Anxiety 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 08/17/2014   Backache 08/17/2014   Lumbosacral spondylosis without myelopathy 08/17/2014   Disorder of male genital organs 08/17/2014   Brash 08/17/2014   Low back pain 08/17/2014   Tendon nodule 08/17/2014   Episodic paroxysmal anxiety disorder 08/17/2014   Hernia, inguinal, right 08/17/2014   Fast heart beat 08/17/2014   Illness 08/17/2014   Inguinal hernia 10/14/2012    REFERRING DIAG: History of stroke with residual deficit, chronic bilateral low back pain with right-sided sciatica, Lumbosacral spondylosis without myelopathy THERAPY DIAG:  Muscle weakness (generalized)  Chronic bilateral low back pain without sciatica  Unsteadiness on feet  Difficulty in walking, not elsewhere classified  Abnormality of gait and mobility  Rationale for  Evaluation and Treatment Rehabilitation  PERTINENT HISTORY: Pt. is a 51 y.o. who was diagnosed with a CVA on July 21st, 2022. Pt. completed several weeks of inpatient rehab at [facility]. After returning to home. He was discharged in late August/early September and recieved home health PT. Pt. sustained a fall in december of 2022, and was admitted to the hospital with COVID-19, and Chronic back pain. He reports chronic back pain  syndrome since 2012. Since the most recent discharge, Pt. has been residing with the ex-wife until the Pt. is ready to return to independent living. He did recieve 2 weeks of home health after discharge in December 2022. He is now being referred to outpatient PT to address weakness from stroke and improve fine motor movement. He reports rarely getting numbness/tingling in RLE. PMHx includes: Bilateral LBP with right sided sciatica, Lumbosacral spondylosis myelopathy, closed compression Fx of L1. Has chronic insomnia. osteoarthritis, Panic anxiety syndrome (taking medication) Pt. enjoys cooking, and riding motorcycles. Patient is going to [facility] spine center for Epidural spinal injections on 06/07/21;  PRECAUTIONS: Fall, Spinal Brace wears daily, approximately 25%, when exercising; semi-rigid lumbar brace  SUBJECTIVE: Pt reports pain level is currently a 3/10. Pt reports his injection is still providing pain relief.    PAIN:  Are you having pain? Yes: NPRS scale: 3/10 Pain location: bilat low back Pain description: dull ache; always constant,  Aggravating factors: worse with sitting,  Relieving factors: heat helps temporarily, pain pills/pain patch   TODAY'S TREATMENT 03/28/22  TherEx Heat applied to low back while he performs the following (skin checked is WNL prior to application and upon removal of heat, no adverse reaction, reports heat feels good to low back):  On mat table- LTRs 20x  Single leg bridge progression( double leg bridge up, knee extension -> flexion then lower down) good transverse plane trunk control noted.  SLR with 5# AW donned 20x each LE. Rates medium  Standing march 2x10 alt LE , seocnd round no UE and CGA for support  Standing hip abd 10x each LE  Heel raise 20x BLE Comments: no increase in LBP  Leg press: moved leg press to allow for pt to access it (moved plate further away) 79# 10x 100# 8x 130# 8x. Pt rates medium   Reports no increase in back pain with  interventions, pain remains at baseline  PATIENT EDUCATION:  Education details: Pt educated throughout session about proper posture and technique with exercises. Improved exercise technique, movement at target joints, use of target muscles after min to mod verbal, visual, tactile cues.   Person educated: Patient Education method: Explanation, Demonstration, Tactile cues, and Verbal cues Education comprehension: verbalized understanding, returned demonstration, verbal cues required, tactile cues required, and needs further education   HOME EXERCISE PROGRAM: No updates on this date pt to continue HEP as previously given     PT Short Term Goals -       PT SHORT TERM GOAL #1   Title Patient will be adherent to HEP at least 3x a week to improve functional strength and balance for better safety at home.    Baseline 4/4: doing them 1-2x a week; 08/20/2021=Patient reports performing his low back stretching and no questions currently HEP. 10/24/21: doing HEP 4x a week   Time 4    Period Weeks    Status Achieved    Target Date 08/21/21      PT SHORT TERM GOAL #2   Title Patient (< 43 years old) will complete five  times sit to stand test in < 15 seconds indicating an increased LE strength and improved balance.    Baseline 4/4: 17.85 sec with arms across chest; 08/20/2021= 17.6 sec , 7/5: 12.85 sec   Time 4    Period Weeks    Status Achieved;    Target Date 10/01/21              PT Long Term Goals -  TARGET DATE FOR GOALS 04/26/2022      PT LONG TERM GOAL #1   Title Patient will increase six minute walk test distance to >1000 for progression to community ambulator and improve gait ability    Baseline 4/4: 1115 feet , 7/5: 1080 feet;    Time 4    Period Weeks    Status Achieved    Target Date 08/21/21      PT LONG TERM GOAL #2   Title Patient will ascend/descend 8 stairs without rail assist independently without loss of balance to improve ability to get in/out of home.    Baseline 4/4:  requires 1 rail assist; 08/20/2021- Patient demonstrated steps without railing using reciprocal steps today. 7/5: ascend/descend 6 steps reciprocally without rail, but required CGA for safety when descending; 7/25: Able to complete without UE support and good balance going up steps, unsafe descending steps without UE support. Required min assist and UE support to prevent LOB descending.; 8/15: requires UUE support to descend and close CGA due to; unsteadiness; 9/26: ascend without UE support, recip pattern; descend with UUE support, recip stepping, but still doesn't feel steady yet with descending; 10/13 able to demo recip pattern ascending hands free, required instance of UE support with descending, recip pattern due to heel getting caught under step; 11/14: deferred; 11/21: mod assist with descending due to unsteadiness, able to complete without UE support ascending   Time 12*corrected   Period Weeks    Status Partially met   Target Date      PT LONG TERM GOAL #3   Title Patient will increase BLE gross strength to 4+/5 as to improve functional strength for independent gait, increased standing tolerance and increased ADL ability.    Baseline 4/4: not formally assessed; 08/20/2021= 4+/5 with Right hip flex/knee ext/flex; Right hip abd= 4/5, 7/5: 4+/5 hip    Time 12    Period Weeks    Status Achieved;    Target Date 11/12/21      PT LONG TERM GOAL #4   Title Patient will improve FOTO score to >50% to indicate improved functional mobility with less pain with ADLs.    Baseline 4/4: 35%; 08/20/2021= 42% 7/5: 47%; 7:25:recently addressed; 11/30/21: 35; 9/26: 54%    Time 12    Period Weeks    Status MET   Target Date      PT LONG TERM GOAL #5   Title Patient will report a worst pain of 4/10 in low back over last week to indicate improved tolerance with ADLs.    Baseline 4/4: 7/10; 08/20/2021= 4/10 , 7/5: worst 7/10 in low back; 7/25: 6/10 ; 8/15: pt reports 6/10; 9/26: 4/10   Time 12    Period Weeks     Status Achieved    Target Date 02/05/2022      Additional Long Term Goals   Additional Long Term Goals Yes      PT LONG TERM GOAL #6   Title Pt will increase 10MWT by at least 0.13 m/s in order to demonstrate clinically significant improvement  in community ambulation    Baseline 08/20/2021=0.94 m/s 7/5: 1.5 m/s   Time 12    Period Weeks    Status Achieved;    Target Date 11/12/21      PT LONG TERM GOAL #7   Title Patient will demonstrate improved static standing balance as seen by ability to single leg stance on right LE > 12 sec consistently for optimal balance on level and unlevel surfaces.    Baseline 08/20/2021= 4 sec SLS on right , 7/5: 4-5 sec inconsistent; 7/25 able to achieve 15 sec after multiple attempts of 4-5 sec holds; 8/15: LLE 15 sec, RLE 8 sec; 9/26: LLE 30 sec, RLE 17 seconds   Time 12    Period Weeks    Status MET   Target Date 02/05/2022    PT LONG TERM GOAL #8  Title Pt will report ability to ambulate for at least 15 minutes without an increase in his LBP in order to improve community participation and ease with ADLs  Baseline 9/26: increase in pain at 15 min mark; 10/13: reports about 20 minutes.   Time 3   Period Weeks   Status MET  Target Date 02/05/2022        PT LONG TERM GOAL #9  Title Pt will report ability to ambulate for at least 45 minutes without an increase in his LBP in order to improve community participation and ease with ADLs  Baseline 9/26: increase in pain at 15 min mark; 10/13: reports about 20 minutes. 11/14: deferred; 11/21: Unable to complete 45 min gait without pain, but does report this has improved. Thinks he can do 30 min before pain increase.  Time 3   Period Weeks   Status ONGOING  Target Date        Plan -     Clinical Impression Statement Continued with current plan of care as laid out in evaluation and recent prior sessions. Pt remains motivated to advance progress toward goals in order to maximize independence and safety at  home. Pt requires high level assistance and cuing for completion of exercises in order to provide adequate level of stimulation challenge while minimizing pain and discomfort when possible. Pt closely monitored throughout session to maximize patient safety during interventions. Pt will continue to benefit from skilled physical therapy intervention to address impairments, improve QOL, and attain therapy goals.   .     Personal Factors and Comorbidities Comorbidity 3+;Past/Current Experience;Time since onset of injury/illness/exacerbation    Comorbidities PMHx includes: Bilateral LBP with right sided sciatica, Lumbosacral spondylosis myelopathy, closed compression Fx of L1. Has chronic insomnia. osteoarthritis, Panic anxiety syndrome (taking medication)    Examination-Activity Limitations Bend;Carry;Locomotion Level;Sit;Squat;Stairs;Stand;Transfers    Examination-Participation Restrictions Cleaning;Community Activity;Driving;Laundry;Meal Prep;Occupation;Shop;Volunteer;Yard Work    Stability/Clinical Decision Making Stable/Uncomplicated    Rehab Potential Good    PT Frequency 2x / week    PT Duration 12 weeks    PT Treatment/Interventions Cryotherapy;Electrical Stimulation;Moist Heat;Traction;Gait training;Stair training;Functional mobility training;Therapeutic activities;Therapeutic exercise;Balance training;Neuromuscular re-education;Patient/family education;Manual techniques;Passive range of motion;Dry needling;Energy conservation    PT Next Visit Plan  Progress core stabilization, Manual therapy for low back and LE ROM, instruct in balance HEP, continue plan       Consulted and Agree with Plan of Care Patient              12:13 PM, 03/28/22 Particia Lather PT    Physical Therapist - [place] [phone]  [place] MAIN [facility] SERVICES [address]  [place], [place], [address] Phone: [phone]   Fax:  [phone]

## 2022-03-31 NOTE — Therapy (Signed)
OUTPATIENT OCCUPATIONAL THERAPY NEURO  TREATMENT NOTE   Patient Name: David Reeves MRN: [id number] DOB:05-29-1970, 51 y.o., male Today's Date: 03/31/2022  PCP: [name], PA-C REFERRING PROVIDER: [name], PA-C   OT End of Session - 03/31/22 1329     Visit Number 23    Number of Visits 1    Date for OT Re-Evaluation 04/16/22    Authorization Type Progress reporting period starting 03/01/22    OT Start Time 0930    OT Stop Time 1015    OT Time Calculation (min) 45 min    Activity Tolerance Patient tolerated treatment well    Behavior During Therapy [facility] for tasks assessed/performed               Past Medical History:  Diagnosis Date   Back pain 04/22/2012   Bone spur    Bulging disc 04/22/2012   Degenerative disc disease    Osteoarthritis    Panic anxiety syndrome    Stroke ([place]) 10/2020   Taking multiple medications for chronic disease    Past Surgical History:  Procedure Laterality Date   CYSTECTOMY     head   HERNIA REPAIR Left 2006,2014   [facility]   TOE SURGERY Right 2007   Patient Active Problem List   Diagnosis Date Noted   Alterations of sensations following cerebrovascular accident 03/08/2021   Aphasia as late effect of cerebrovascular accident (CVA) 03/08/2021   Muscle spasm 02/22/2021   Acute non-recurrent frontal sinusitis 02/22/2021   Chronic pain syndrome 01/23/2021   Primary hypertension 01/23/2021   Nerve pain 01/23/2021   Erectile dysfunction due to diseases classified elsewhere 01/23/2021   Cognitive dysfunction 01/23/2021   Scrotal edema 01/23/2021   Elevated LDL cholesterol level 01/23/2021   History of stroke with residual deficit 01/23/2021   Primary insomnia 01/23/2021   Mouth pain 01/23/2021   Laceration of right hand without foreign body    Closed compression fracture of L1 vertebra ([place]) 05/24/2016   Lumbar stenosis with neurogenic claudication 11/01/2015   Chronic bilateral low back pain with right-sided  sciatica 11/08/2014   Hx of hemorrhoids 11/08/2014   AAA (abdominal aortic aneurysm) ([place]) 09/22/2014   Chronic pain associated with significant psychosocial dysfunction 09/22/2014   Panic disorder 09/22/2014   AB (asthmatic bronchitis) 08/17/2014   Anxiety 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 08/17/2014   Backache 08/17/2014   Lumbosacral spondylosis without myelopathy 08/17/2014   Disorder of male genital organs 08/17/2014   Brash 08/17/2014   Low back pain 08/17/2014   Tendon nodule 08/17/2014   Episodic paroxysmal anxiety disorder 08/17/2014   Hernia, inguinal, right 08/17/2014   Fast heart beat 08/17/2014   Illness 08/17/2014   Inguinal hernia 10/14/2012    ONSET DATE: 11/09/2020  REFERRING DIAG: CVA  THERAPY DIAG:  Muscle weakness (generalized)  Other lack of coordination  H/O ischemic left MCA stroke  Rationale for Evaluation and Treatment Rehabilitation  PERTINENT HISTORY: Pt. is a 51 y.o. who was diagnosed with a CVA on July 21st, 2022. Pt. completed several weeks of  inpatient rehab at [facility]. After returning to home, Pt. sustained a fall in december of 2022, and was admitted to the hospital with COVID-19, and back pain from the fall. Since the most recent discharge, Pt. has been residing with the ex-wife until the Pt. is ready to return to independent living. Pt. PMHx includes: Bilateral LBP with right sided sciatica, Lumbosacral spondylosis myelopathy, closed compression Fx of L1. Pt. enjoys cooking, and riding motorcycles.  PRECAUTIONS: Fall  SUBJECTIVE: Pt reports doing well today. PAIN:  Are you having pain? Yes: NPRS scale: 2/10 Pain location: low back Pain description: aching Aggravating factors: prolonged sitting or lying down Relieving factors: rest, heat, walking  [facility] OT Assessment        12/11/21 01/22/22 03/01/22             Observation/Other Assessments      Focus on Therapeutic Outcomes (FOTO)   46 50 49             Coordination      Right  9 Hole Peg Test unable  unable unable    Left 9 Hole Peg Test 30 sec  30 sec NT             Hand Function      Right Hand Grip (lbs) 35 (3rd setting) 30 lb (3rd);  41 lb (4th setting),  44 lbs (4th setting)    Right Hand Lateral Pinch 9 lbs   slipping 12 lbs (thumb slips) 16 lbs (multiple trials d/t thumb slips)    Right Hand 3 Point Pinch   unable to position thumb to produce 3 pt pinch Unable to position thumb to produce 3 point pinch unable    Left Hand Grip (lbs) 116       Left Hand Lateral Pinch 29 lbs       Left 3 point pinch 27 lbs         OBJECTIVE:  TODAY'S TREATMENT:  Therapeutic Exercise: Participated in place and hold for thumb radial abd, resisted lateral pinch pushing into foam dowel, active assisted palmar abd, active assisted thumb IP extension, and active digit flex/ext x2 sets.  Performed active digit abd and active assisted add, working to strengthen intrinsics x2 sets 7 each. Facilitated 3 point pinch, pushing into foam dowel, with OT providing set up and stabilization of digits in 3 point pinch pattern while pt pinched into dowel for 3 sets 10 reps each.  Pt requires rest breaks between each set for above noted exercises.      Neuro re-ed: Practiced 3 point pinch patterns placing and removing ball pegs from pegboard.  Practiced increasing pressure on peg to reduce dropped pegs with vc for engaging LF; increased pressure results in R thumb slipping from peg.    PATIENT EDUCATION: Education details: HEP progression, grasp/release activities; wrist flex against gravity, lateral and 3 point pinch with foam dowel Person educated: Patient Education method: Explanation Education comprehension: verbalized and demonstrated understanding.      OT Short Term Goals -      OT SHORT TERM GOAL #1   Title Pt. wil demonstrtae independence with HEPs    Baseline Eval: Pt. currently does not have one, 10th visit:  changes in HEP ongoing; 20th: instructed in putty exercises, changes  ongoing; 40th: continual updates to HEP as pt progresses; 50th: continual updates; 01/22/22: continual updates; 03/01/22: continual updates   Time 6    Period Weeks    Status On-going    Target Date 03/05/22             OT Long Term Goals -       OT LONG TERM GOAL #1   Title Pt. will improve RUE strength by 2 mm grades to assist with ADLs, and IADLs,    Baseline Eval: Right shoulder flexion, abduction 3/5, elbow flexion, extension wrist extension 3+/5, 10th visit: improving with RUE strength by not yet met goal; 20th visit: R  shd flex/abd 4-, elbow flex/ext 4+, wrist flex 4-, wrist ext 4+; 40:  R shd flex/abd 4,  elbow flex/ext 4+, wrist flex 4, wrist ext 4+; 50th visit 8/22: R shoulder flex/abd 4/5, elbow flex/ext 5/5, wrist flex 4, wrist ext 4+; 01/22/22: shoulder flex/abd 4/5, elbow flex/ext 5/5, wrist flex 4, wrist ext 4+; 03/01/22: same as 01/22/22   Time 12    Period Weeks    Status On-going    Target Date 04/16/22      OT LONG TERM GOAL #2   Title Pt. will improve right grip by 10 lbs to prepare for firmly holding objects for IADLs.    Baseline Eval: R: 38#, L: 124# (In 3rd dynamometer slot); R 35# in 4th slot, 28# in 3rd slot, 40th: 47 in 4th slot; 50th: 12/11/21: 46# on 3rd slot; 01/22/22: 30# on 3rd slot; 03/01/22: 44# 4th slot   Time 12    Period Weeks    Status On-going    Target Date 04/16/22      OT LONG TERM GOAL #3   Title Pt. will improve right lateral pinch strength by 5# to assist with cutting food    Baseline Eval: R: 17, L: 25; 20th: R: 18; 40th: 15# fingers slipping on meter, 50th: 9# fingers slipping; 01/22/22: 12# fingers slipping; 03/01/22: 16#   Time 12    Period Weeks    Status On-going    Target Date 04/16/22      OT LONG TERM GOAL #4   Title Pt. will improve Right 3pt pinch by 2# to be able to hold/open items for cooking    Baseline Eval: R: Pt. unable to engage thumb L: 29#; 20th: R unable; 40th:  unable, fingers slipping; 50th:  unable, fingers  slipping; 01/22/22: fingers slipping but pt is now able to grasp a ball peg with 3 point pinch pattern; 03/01/22: same as 10/3 in addition to being able to pick up ball peg from pegboard then place peg back into pegboard with a 3 point pinch pattern.   Time 12    Period Weeks    Status On-going    Target Date 04/16/22      OT LONG TERM GOAL #5   Title Pt. will improve right hand [facility] skills to be able to independently manipulate buttons, and zippers.    Baseline Eval: Pt. has difficulty managing buttons,a nd zippers. 10th visit:  improving; 20th: unable to engage R hand effectively, performs 1 handed with L; 50th: performs L handed only; 01/22/22: pt can use R hand as a stabilizer with clothing fasteners with mod vc; 03/01/22: same as 01/22/22   Time 12    Period Weeks    Status On-going    Target Date 04/16/22      OT LONG TERM GOAL #6   Title Pt. will independently write his name    Baseline Eval: Pt. is unable to hold a pen; 20th: pt can grip PenAgain with R hand and can make marks on paper with difficulty, but not yet writing name; 40th:  continues to demonstrate difficulty with grasp of pen; 50th 12/11/21: With assist to set up hand on pen again, pt was able to write first name and initial of last name in 1 1/4" tall letters with fair legibility (pt wants to defer this goal, states this is low on his list of priorities for now)   Time 12    Period Weeks    Status Defer   Target Date 04/16/22  OT LONG TERM GOAL #7   Title Pt. will improve FOTO score by 2 points to reflect funational improvement    Baseline Eval: FOTO score 46 with TR score 52; 20th: 45; 40th: 14;  50th: 46; 01/22/22: 50; 03/01/22: 49   Time 12    Period Weeks    Status On-going    Target Date 04/16/22             Plan -    Clinical Impression Statement Pt required frequent rest breaks with all activities this date.  Practiced 3 point pinch patterns placing and removing ball pegs from pegboard.  Practiced  increasing pressure on peg to reduce dropped pegs with vc for engaging LF; increased pressure results in R thumb slipping from peg.  Pt will continue to work towards neuro re-ed of right hand with emphasis on thumb movements to integrate into functional grasping and manipulation skills.    OT Occupational Profile and History Detailed Assessment- Review of Records and additional review of physical, cognitive, psychosocial history related to current functional performance    Occupational performance deficits (Please refer to evaluation for details): ADL's;IADL's;Education    Rehab Potential Good    Clinical Decision Making Several treatment options, min-mod task modification necessary    Comorbidities Affecting Occupational Performance: May have comorbidities impacting occupational performance    Modification or Assistance to Complete Evaluation  Min-Moderate modification of tasks or assist with assess necessary to complete eval    OT Frequency 2x / week    OT Duration 12 weeks    OT Treatment/Interventions Self-care/ADL training;DME and/or AE instruction;Therapeutic exercise;Ultrasound;Neuromuscular education;Therapeutic activities;Energy conservation;Moist Heat;Patient/family education;Splinting;Functional Mobility Training;Paraffin    Plan Neuro re-ed and therapeutic exercise    Consulted and Agree with Plan of Care Patient             Leta Speller, MS, OTR/L   Darleene Cleaver, OT 03/31/2022, 1:32 PM

## 2022-04-01 ENCOUNTER — Other Ambulatory Visit: Payer: Self-pay | Admitting: Physician Assistant

## 2022-04-01 DIAGNOSIS — F09 Unspecified mental disorder due to known physiological condition: Secondary | ICD-10-CM

## 2022-04-01 DIAGNOSIS — I6930 Unspecified sequelae of cerebral infarction: Secondary | ICD-10-CM

## 2022-04-01 DIAGNOSIS — F410 Panic disorder [episodic paroxysmal anxiety] without agoraphobia: Secondary | ICD-10-CM

## 2022-04-02 ENCOUNTER — Ambulatory Visit: Payer: Medicare Other | Admitting: Physical Therapy

## 2022-04-02 ENCOUNTER — Ambulatory Visit: Payer: Medicare Other

## 2022-04-02 ENCOUNTER — Other Ambulatory Visit: Payer: Self-pay | Admitting: Physician Assistant

## 2022-04-02 DIAGNOSIS — R278 Other lack of coordination: Secondary | ICD-10-CM

## 2022-04-02 DIAGNOSIS — G8929 Other chronic pain: Secondary | ICD-10-CM

## 2022-04-02 DIAGNOSIS — M545 Low back pain, unspecified: Secondary | ICD-10-CM | POA: Diagnosis not present

## 2022-04-02 DIAGNOSIS — Z8673 Personal history of transient ischemic attack (TIA), and cerebral infarction without residual deficits: Secondary | ICD-10-CM

## 2022-04-02 DIAGNOSIS — M6281 Muscle weakness (generalized): Secondary | ICD-10-CM

## 2022-04-02 DIAGNOSIS — G894 Chronic pain syndrome: Secondary | ICD-10-CM

## 2022-04-02 DIAGNOSIS — R269 Unspecified abnormalities of gait and mobility: Secondary | ICD-10-CM

## 2022-04-02 DIAGNOSIS — R2681 Unsteadiness on feet: Secondary | ICD-10-CM

## 2022-04-02 DIAGNOSIS — I6930 Unspecified sequelae of cerebral infarction: Secondary | ICD-10-CM

## 2022-04-02 DIAGNOSIS — R262 Difficulty in walking, not elsewhere classified: Secondary | ICD-10-CM

## 2022-04-02 NOTE — Therapy (Signed)
OUTPATIENT OCCUPATIONAL THERAPY NEURO  TREATMENT NOTE   Patient Name: David Reeves MRN: [id number] DOB:[birth year], 51 y.o., male Today's Date: 04/02/2022  PCP: [name], PA-C REFERRING PROVIDER: [name], PA-C   OT End of Session - 04/02/22 0938     Visit Number 3    Number of Visits 50    Date for OT Re-Evaluation 04/16/22    Authorization Type Progress reporting period starting 03/01/22    OT Start Time 0845    OT Stop Time 0930    OT Time Calculation (min) 45 min    Activity Tolerance Patient tolerated treatment well    Behavior During Therapy [facility] for tasks assessed/performed               Past Medical History:  Diagnosis Date   Back pain 04/22/2012   Bone spur    Bulging disc 04/22/2012   Degenerative disc disease    Osteoarthritis    Panic anxiety syndrome    Stroke ([place]) 10/2020   Taking multiple medications for chronic disease    Past Surgical History:  Procedure Laterality Date   CYSTECTOMY     head   HERNIA REPAIR Left 2006,2014   [facility]   TOE SURGERY Right 2007   Patient Active Problem List   Diagnosis Date Noted   Alterations of sensations following cerebrovascular accident 03/08/2021   Aphasia as late effect of cerebrovascular accident (CVA) 03/08/2021   Muscle spasm 02/22/2021   Acute non-recurrent frontal sinusitis 02/22/2021   Chronic pain syndrome 01/23/2021   Primary hypertension 01/23/2021   Nerve pain 01/23/2021   Erectile dysfunction due to diseases classified elsewhere 01/23/2021   Cognitive dysfunction 01/23/2021   Scrotal edema 01/23/2021   Elevated LDL cholesterol level 01/23/2021   History of stroke with residual deficit 01/23/2021   Primary insomnia 01/23/2021   Mouth pain 01/23/2021   Laceration of right hand without foreign body    Closed compression fracture of L1 vertebra ([place]) 05/24/2016   Lumbar stenosis with neurogenic claudication 11/01/2015   Chronic bilateral low back pain with right-sided  sciatica 11/08/2014   Hx of hemorrhoids 11/08/2014   AAA (abdominal aortic aneurysm) ([place]) 09/22/2014   Chronic pain associated with significant psychosocial dysfunction 09/22/2014   Panic disorder 09/22/2014   AB (asthmatic bronchitis) 08/17/2014   Anxiety 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 08/17/2014   Backache 08/17/2014   Lumbosacral spondylosis without myelopathy 08/17/2014   Disorder of male genital organs 08/17/2014   Brash 08/17/2014   Low back pain 08/17/2014   Tendon nodule 08/17/2014   Episodic paroxysmal anxiety disorder 08/17/2014   Hernia, inguinal, right 08/17/2014   Fast heart beat 08/17/2014   Illness 08/17/2014   Inguinal hernia 10/14/2012    ONSET DATE: 11/09/2020  REFERRING DIAG: CVA  THERAPY DIAG:  Muscle weakness (generalized)  Other lack of coordination  H/O ischemic left MCA stroke  Rationale for Evaluation and Treatment Rehabilitation  PERTINENT HISTORY: Pt. is a 51 y.o. who was diagnosed with a CVA on July 21st, 2022. Pt. completed several weeks of  inpatient rehab at [facility]. After returning to home, Pt. sustained a fall in december of 2022, and was admitted to the hospital with COVID-19, and back pain from the fall. Since the most recent discharge, Pt. has been residing with the ex-wife until the Pt. is ready to return to independent living. Pt. PMHx includes: Bilateral LBP with right sided sciatica, Lumbosacral spondylosis myelopathy, closed compression Fx of L1. Pt. enjoys cooking, and riding motorcycles.  PRECAUTIONS: Fall  SUBJECTIVE: Pt reports he's been better.  Has a visit planned with the pain doctor tomorrow.  PAIN:  Are you having pain? Yes: NPRS scale: 2/10 Pain location: low back Pain description: aching Aggravating factors: prolonged sitting or lying down Relieving factors: rest, heat, walking  [facility] OT Assessment        12/11/21 01/22/22 03/01/22             Observation/Other Assessments      Focus on Therapeutic Outcomes  (FOTO)   46 50 49             Coordination      Right 9 Hole Peg Test unable  unable unable    Left 9 Hole Peg Test 30 sec  30 sec NT             Hand Function      Right Hand Grip (lbs) 35 (3rd setting) 30 lb (3rd);  41 lb (4th setting),  44 lbs (4th setting)    Right Hand Lateral Pinch 9 lbs   slipping 12 lbs (thumb slips) 16 lbs (multiple trials d/t thumb slips)    Right Hand 3 Point Pinch   unable to position thumb to produce 3 pt pinch Unable to position thumb to produce 3 point pinch unable    Left Hand Grip (lbs) 116       Left Hand Lateral Pinch 29 lbs       Left 3 point pinch 27 lbs       OBJECTIVE:  TODAY'S TREATMENT:  Therapeutic Exercise: Completed wrist maze x3 reps on RUE, min vc for positioning of RUE to isolate wrist movements from proximal movements.  Participated in place and hold for thumb radial abd, resisted lateral pinch pushing into foam dowel, active assisted palmar abd, active assisted thumb IP extension, and active digit flex/ext x2 sets 7-10 reps.  Performed active digit abd and active assisted add, working to strengthen intrinsics x2 sets 7 reps each. Facilitated 3 point pinch, pushing into foam dowel, with OT providing set up and stabilization of digits in 3 point pinch pattern while pt pinched into dowel for 3 sets 10 reps each.  Tactile cue given at IP of thumb to minimize flexion during 3 point pinching to limit thumb slipping.  Pt requires rest breaks between each set for above noted exercises.      Neuro re-ed: Practiced 3 point pinch patterns placing and removing jumbo pegs from pegboard.  Pt required repeated repositioning of fingers on peg and mod A to place/remove a peg without fingers slipping today.  Practiced grasp/release of various ball types (all smaller than a tennis ball), with min tactile and vc for grasping ball trying to limit thumb IP flexion to prevent thumb slipping.     PATIENT EDUCATION: Education details: HEP progression, grasp/release  activities; wrist flex against gravity, lateral and 3 point pinch with foam dowel Person educated: Patient Education method: Explanation Education comprehension: verbalized and demonstrated understanding.      OT Short Term Goals -      OT SHORT TERM GOAL #1   Title Pt. wil demonstrtae independence with HEPs    Baseline Eval: Pt. currently does not have one, 10th visit:  changes in HEP ongoing; 20th: instructed in putty exercises, changes ongoing; 40th: continual updates to HEP as pt progresses; 50th: continual updates; 01/22/22: continual updates; 03/01/22: continual updates   Time 6    Period Weeks    Status On-going  Target Date 03/05/22             OT Long Term Goals -       OT LONG TERM GOAL #1   Title Pt. will improve RUE strength by 2 mm grades to assist with ADLs, and IADLs,    Baseline Eval: Right shoulder flexion, abduction 3/5, elbow flexion, extension wrist extension 3+/5, 10th visit: improving with RUE strength by not yet met goal; 20th visit: R shd flex/abd 4-, elbow flex/ext 4+, wrist flex 4-, wrist ext 4+; 40:  R shd flex/abd 4,  elbow flex/ext 4+, wrist flex 4, wrist ext 4+; 50th visit 8/22: R shoulder flex/abd 4/5, elbow flex/ext 5/5, wrist flex 4, wrist ext 4+; 01/22/22: shoulder flex/abd 4/5, elbow flex/ext 5/5, wrist flex 4, wrist ext 4+; 03/01/22: same as 01/22/22   Time 12    Period Weeks    Status On-going    Target Date 04/16/22      OT LONG TERM GOAL #2   Title Pt. will improve right grip by 10 lbs to prepare for firmly holding objects for IADLs.    Baseline Eval: R: 38#, L: 124# (In 3rd dynamometer slot); R 35# in 4th slot, 28# in 3rd slot, 40th: 47 in 4th slot; 50th: 12/11/21: 46# on 3rd slot; 01/22/22: 30# on 3rd slot; 03/01/22: 44# 4th slot   Time 12    Period Weeks    Status On-going    Target Date 04/16/22      OT LONG TERM GOAL #3   Title Pt. will improve right lateral pinch strength by 5# to assist with cutting food    Baseline Eval: R: 17, L:  25; 20th: R: 18; 40th: 15# fingers slipping on meter, 50th: 9# fingers slipping; 01/22/22: 12# fingers slipping; 03/01/22: 16#   Time 12    Period Weeks    Status On-going    Target Date 04/16/22      OT LONG TERM GOAL #4   Title Pt. will improve Right 3pt pinch by 2# to be able to hold/open items for cooking    Baseline Eval: R: Pt. unable to engage thumb L: 29#; 20th: R unable; 40th:  unable, fingers slipping; 50th:  unable, fingers slipping; 01/22/22: fingers slipping but pt is now able to grasp a ball peg with 3 point pinch pattern; 03/01/22: same as 10/3 in addition to being able to pick up ball peg from pegboard then place peg back into pegboard with a 3 point pinch pattern.   Time 12    Period Weeks    Status On-going    Target Date 04/16/22      OT LONG TERM GOAL #5   Title Pt. will improve right hand [facility] skills to be able to independently manipulate buttons, and zippers.    Baseline Eval: Pt. has difficulty managing buttons,a nd zippers. 10th visit:  improving; 20th: unable to engage R hand effectively, performs 1 handed with L; 50th: performs L handed only; 01/22/22: pt can use R hand as a stabilizer with clothing fasteners with mod vc; 03/01/22: same as 01/22/22   Time 12    Period Weeks    Status On-going    Target Date 04/16/22      OT LONG TERM GOAL #6   Title Pt. will independently write his name    Baseline Eval: Pt. is unable to hold a pen; 20th: pt can grip PenAgain with R hand and can make marks on paper with difficulty, but not yet writing  name; 40th:  continues to demonstrate difficulty with grasp of pen; 50th 12/11/21: With assist to set up hand on pen again, pt was able to write first name and initial of last name in 1 1/4" tall letters with fair legibility (pt wants to defer this goal, states this is low on his list of priorities for now)   Time 12    Period Weeks    Status Defer   Target Date 04/16/22      OT LONG TERM GOAL #7   Title Pt. will improve FOTO score by  2 points to reflect funational improvement    Baseline Eval: FOTO score 46 with TR score 52; 20th: 45; 40th: 44;  50th: 46; 01/22/22: 50; 03/01/22: 49   Time 12    Period Weeks    Status On-going    Target Date 04/16/22             Plan -    Clinical Impression Statement Pt required frequent rest breaks with all activities this date.  Practiced 3 point pinch patterns placing and removing jumbo pegs from pegboard.  Pt required repeated repositioning of fingers on peg and mod A to place/remove a peg without fingers slipping today.  Practiced grasp/release of various ball types (all smaller than a tennis ball), with min tactile and vc for grasping ball trying to limit thumb IP flexion to prevent thumb slipping.  Pt continues to struggle with maintaining a 3 point pinch with enough pressure to keep from dropping items in fingers while avoiding too much pressure which causes thumb to slip d/t spasticity increasing IP flexion.  IF and LF also tend to slip when increasing pinch pressure with spasticity increasing PIP flexion of these digits.  Pt requires extensive tactile cues and min A to block thumb IP joint to maintain a neutral position of this joint while pinching.  Pt will continue to work towards neuro re-ed of right hand with emphasis on thumb movements to integrate into functional grasping and manipulation skills.    OT Occupational Profile and History Detailed Assessment- Review of Records and additional review of physical, cognitive, psychosocial history related to current functional performance    Occupational performance deficits (Please refer to evaluation for details): ADL's;IADL's;Education    Rehab Potential Good    Clinical Decision Making Several treatment options, min-mod task modification necessary    Comorbidities Affecting Occupational Performance: May have comorbidities impacting occupational performance    Modification or Assistance to Complete Evaluation  Min-Moderate  modification of tasks or assist with assess necessary to complete eval    OT Frequency 2x / week    OT Duration 12 weeks    OT Treatment/Interventions Self-care/ADL training;DME and/or AE instruction;Therapeutic exercise;Ultrasound;Neuromuscular education;Therapeutic activities;Energy conservation;Moist Heat;Patient/family education;Splinting;Functional Mobility Training;Paraffin    Plan Neuro re-ed and therapeutic exercise    Consulted and Agree with Plan of Care Patient             Leta Speller, MS, OTR/L   Darleene Cleaver, OT 04/02/2022, 9:39 AM

## 2022-04-02 NOTE — Therapy (Signed)
[place] [facility] SERVICES [address] [place], [place], [address] Phone: [phone]   Fax:  [phone]  Patient Details  Name: David Reeves MRN: [id number] Date of Birth: Sep 16, 1970 Referring Provider:  [name], PA-C  Encounter Date: 04/02/2022   OUTPATIENT PHYSICAL THERAPY TREATMENT NOTE      Patient Name: David Reeves MRN: [id number] DOB:08-19-1970, 50 y.o., male Today's Date: 04/02/2022  PCP: [name], PA-C REFERRING PROVIDER: [name], PA-C   PT End of Session - 04/02/22 0806     Visit Number 58    Number of Visits 43    Date for PT Re-Evaluation 04/26/22    Authorization Type [employer]    Authorization Time Period 02/01/22-04/26/22    Progress Note Due on Visit 85    PT Start Time 0804    PT Stop Time 0844    PT Time Calculation (min) 40 min    Equipment Utilized During Treatment Gait belt    Activity Tolerance Patient tolerated treatment well;No increased pain    Behavior During Therapy [facility] for tasks assessed/performed                    Past Medical History:  Diagnosis Date   Back pain 04/22/2012   Bone spur    Bulging disc 04/22/2012   Degenerative disc disease    Osteoarthritis    Panic anxiety syndrome    Stroke ([place]) 10/2020   Taking multiple medications for chronic disease    Past Surgical History:  Procedure Laterality Date   CYSTECTOMY     head   HERNIA REPAIR Left 2006,2014   [facility]   TOE SURGERY Right 2007   Patient Active Problem List   Diagnosis Date Noted   Alterations of sensations following cerebrovascular accident 03/08/2021   Aphasia as late effect of cerebrovascular accident (CVA) 03/08/2021   Muscle spasm 02/22/2021   Acute non-recurrent frontal sinusitis 02/22/2021   Chronic pain syndrome 01/23/2021   Primary hypertension 01/23/2021   Nerve pain 01/23/2021   Erectile dysfunction due to diseases classified elsewhere 01/23/2021    Cognitive dysfunction 01/23/2021   Scrotal edema 01/23/2021   Elevated LDL cholesterol level 01/23/2021   History of stroke with residual deficit 01/23/2021   Primary insomnia 01/23/2021   Mouth pain 01/23/2021   Laceration of right hand without foreign body    Closed compression fracture of L1 vertebra ([place]) 05/24/2016   Lumbar stenosis with neurogenic claudication 11/01/2015   Chronic bilateral low back pain with right-sided sciatica 11/08/2014   Hx of hemorrhoids 11/08/2014   AAA (abdominal aortic aneurysm) ([place]) 09/22/2014   Chronic pain associated with significant psychosocial dysfunction 09/22/2014   Panic disorder 09/22/2014   AB (asthmatic bronchitis) 08/17/2014   Anxiety 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 08/17/2014   Backache 08/17/2014   Lumbosacral spondylosis without myelopathy 08/17/2014   Disorder of male genital organs 08/17/2014   Brash 08/17/2014   Low back pain 08/17/2014   Tendon nodule 08/17/2014   Episodic paroxysmal anxiety disorder 08/17/2014   Hernia, inguinal, right 08/17/2014   Fast heart beat 08/17/2014   Illness 08/17/2014   Inguinal hernia 10/14/2012    REFERRING DIAG: History of stroke with residual deficit, chronic bilateral low back pain with right-sided sciatica, Lumbosacral spondylosis without myelopathy THERAPY DIAG:  Unsteadiness on feet  Difficulty in walking, not elsewhere classified  Abnormality of gait and mobility  Chronic bilateral low back pain without sciatica  Rationale for Evaluation and Treatment  Rehabilitation  PERTINENT HISTORY: Pt. is a 51 y.o. who was diagnosed with a CVA on July 21st, 2022. Pt. completed several weeks of inpatient rehab at [facility]. After returning to home. He was discharged in late August/early September and recieved home health PT. Pt. sustained a fall in december of 2022, and was admitted to the hospital with COVID-19, and Chronic back pain. He reports chronic back pain syndrome since 2012. Since  the most recent discharge, Pt. has been residing with the ex-wife until the Pt. is ready to return to independent living. He did recieve 2 weeks of home health after discharge in December 2022. He is now being referred to outpatient PT to address weakness from stroke and improve fine motor movement. He reports rarely getting numbness/tingling in RLE. PMHx includes: Bilateral LBP with right sided sciatica, Lumbosacral spondylosis myelopathy, closed compression Fx of L1. Has chronic insomnia. osteoarthritis, Panic anxiety syndrome (taking medication) Pt. enjoys cooking, and riding motorcycles. Patient is going to [facility] spine center for Epidural spinal injections on 06/07/21;  PRECAUTIONS: Fall, Spinal Brace wears daily, approximately 25%, when exercising; semi-rigid lumbar brace  SUBJECTIVE: Pt reports pain level is currently a 3/10. Pt reports his injection is still providing pain relief.    PAIN:  Are you having pain? Yes: NPRS scale: 3/10 Pain location: bilat low back Pain description: dull ache; always constant,  Aggravating factors: worse with sitting,  Relieving factors: heat helps temporarily, pain pills/pain patch   TODAY'S TREATMENT 04/02/22  TherEx  On mat table- LTRs 20x  Single leg bridge progression( double leg bridge up, knee extension -> flexion then lower down) good transverse plane trunk control noted.  SLR with 5# AW donned 20x each LE. Rates medium  Standing march followed by hip extension 2x10 alt LE , seocnd round no UE and CGA for support   Heel raise 20x BLE, no UE support to challenge balance responses Comments: no increase in LBP  Leg press: moved leg press to allow for pt to access it (moved plate further away) 00# 15x 100# 10x 115# x8  130# 8x. Pt rates medium 145X6 reps   Reports no increase in back pain with interventions, pain remains at baseline  PATIENT EDUCATION:  Education details: Pt educated throughout session about proper posture and technique  with exercises. Improved exercise technique, movement at target joints, use of target muscles after min to mod verbal, visual, tactile cues.   Person educated: Patient Education method: Explanation, Demonstration, Tactile cues, and Verbal cues Education comprehension: verbalized understanding, returned demonstration, verbal cues required, tactile cues required, and needs further education   HOME EXERCISE PROGRAM: No updates on this date pt to continue HEP as previously given     PT Short Term Goals -       PT SHORT TERM GOAL #1   Title Patient will be adherent to HEP at least 3x a week to improve functional strength and balance for better safety at home.    Baseline 4/4: doing them 1-2x a week; 08/20/2021=Patient reports performing his low back stretching and no questions currently HEP. 10/24/21: doing HEP 4x a week   Time 4    Period Weeks    Status Achieved    Target Date 08/21/21      PT SHORT TERM GOAL #2   Title Patient (< 79 years old) will complete five times sit to stand test in < 15 seconds indicating an increased LE strength and improved balance.    Baseline 4/4: 17.85 sec with  arms across chest; 08/20/2021= 17.6 sec , 7/5: 12.85 sec   Time 4    Period Weeks    Status Achieved;    Target Date 10/01/21              PT Long Term Goals -  TARGET DATE FOR GOALS 04/26/2022      PT LONG TERM GOAL #1   Title Patient will increase six minute walk test distance to >1000 for progression to community ambulator and improve gait ability    Baseline 4/4: 1115 feet , 7/5: 1080 feet;    Time 4    Period Weeks    Status Achieved    Target Date 08/21/21      PT LONG TERM GOAL #2   Title Patient will ascend/descend 8 stairs without rail assist independently without loss of balance to improve ability to get in/out of home.    Baseline 4/4: requires 1 rail assist; 08/20/2021- Patient demonstrated steps without railing using reciprocal steps today. 7/5: ascend/descend 6 steps reciprocally  without rail, but required CGA for safety when descending; 7/25: Able to complete without UE support and good balance going up steps, unsafe descending steps without UE support. Required min assist and UE support to prevent LOB descending.; 8/15: requires UUE support to descend and close CGA due to; unsteadiness; 9/26: ascend without UE support, recip pattern; descend with UUE support, recip stepping, but still doesn't feel steady yet with descending; 10/13 able to demo recip pattern ascending hands free, required instance of UE support with descending, recip pattern due to heel getting caught under step; 11/14: deferred; 11/21: mod assist with descending due to unsteadiness, able to complete without UE support ascending   Time 12*corrected   Period Weeks    Status Partially met   Target Date      PT LONG TERM GOAL #3   Title Patient will increase BLE gross strength to 4+/5 as to improve functional strength for independent gait, increased standing tolerance and increased ADL ability.    Baseline 4/4: not formally assessed; 08/20/2021= 4+/5 with Right hip flex/knee ext/flex; Right hip abd= 4/5, 7/5: 4+/5 hip    Time 12    Period Weeks    Status Achieved;    Target Date 11/12/21      PT LONG TERM GOAL #4   Title Patient will improve FOTO score to >50% to indicate improved functional mobility with less pain with ADLs.    Baseline 4/4: 35%; 08/20/2021= 42% 7/5: 47%; 7:25:recently addressed; 11/30/21: 35; 9/26: 54%    Time 12    Period Weeks    Status MET   Target Date      PT LONG TERM GOAL #5   Title Patient will report a worst pain of 4/10 in low back over last week to indicate improved tolerance with ADLs.    Baseline 4/4: 7/10; 08/20/2021= 4/10 , 7/5: worst 7/10 in low back; 7/25: 6/10 ; 8/15: pt reports 6/10; 9/26: 4/10   Time 12    Period Weeks    Status Achieved    Target Date 02/05/2022      Additional Long Term Goals   Additional Long Term Goals Yes      PT LONG TERM GOAL #6   Title  Pt will increase 10MWT by at least 0.13 m/s in order to demonstrate clinically significant improvement in community ambulation    Baseline 08/20/2021=0.94 m/s 7/5: 1.5 m/s   Time 12    Period Weeks    Status  Achieved;    Target Date 11/12/21      PT LONG TERM GOAL #7   Title Patient will demonstrate improved static standing balance as seen by ability to single leg stance on right LE > 12 sec consistently for optimal balance on level and unlevel surfaces.    Baseline 08/20/2021= 4 sec SLS on right , 7/5: 4-5 sec inconsistent; 7/25 able to achieve 15 sec after multiple attempts of 4-5 sec holds; 8/15: LLE 15 sec, RLE 8 sec; 9/26: LLE 30 sec, RLE 17 seconds   Time 12    Period Weeks    Status MET   Target Date 02/05/2022    PT LONG TERM GOAL #8  Title Pt will report ability to ambulate for at least 15 minutes without an increase in his LBP in order to improve community participation and ease with ADLs  Baseline 9/26: increase in pain at 15 min mark; 10/13: reports about 20 minutes.   Time 3   Period Weeks   Status MET  Target Date 02/05/2022        PT LONG TERM GOAL #9  Title Pt will report ability to ambulate for at least 45 minutes without an increase in his LBP in order to improve community participation and ease with ADLs  Baseline 9/26: increase in pain at 15 min mark; 10/13: reports about 20 minutes. 11/14: deferred; 11/21: Unable to complete 45 min gait without pain, but does report this has improved. Thinks he can do 30 min before pain increase.  Time 3   Period Weeks   Status ONGOING  Target Date        Plan -     Clinical Impression Statement Continued with current plan of care as laid out in evaluation and recent prior sessions. Pt remains motivated to advance progress toward goals in order to maximize independence and safety at home. Pt continuing to show progress with LE strength and balance AEB various progression of interventions this session.  Pt closely monitored  throughout session to maximize patient safety during interventions. Pt will continue to benefit from skilled physical therapy intervention to address impairments, improve QOL, and attain therapy goals.     Personal Factors and Comorbidities Comorbidity 3+;Past/Current Experience;Time since onset of injury/illness/exacerbation    Comorbidities PMHx includes: Bilateral LBP with right sided sciatica, Lumbosacral spondylosis myelopathy, closed compression Fx of L1. Has chronic insomnia. osteoarthritis, Panic anxiety syndrome (taking medication)    Examination-Activity Limitations Bend;Carry;Locomotion Level;Sit;Squat;Stairs;Stand;Transfers    Examination-Participation Restrictions Cleaning;Community Activity;Driving;Laundry;Meal Prep;Occupation;Shop;Volunteer;Yard Work    Stability/Clinical Decision Making Stable/Uncomplicated    Rehab Potential Good    PT Frequency 2x / week    PT Duration 12 weeks    PT Treatment/Interventions Cryotherapy;Electrical Stimulation;Moist Heat;Traction;Gait training;Stair training;Functional mobility training;Therapeutic activities;Therapeutic exercise;Balance training;Neuromuscular re-education;Patient/family education;Manual techniques;Passive range of motion;Dry needling;Energy conservation    PT Next Visit Plan  Progress core stabilization, Manual therapy for low back and LE ROM, instruct in balance HEP, continue plan       Consulted and Agree with Plan of Care Patient              10:06 AM, 04/02/22 Particia Lather PT    Physical Therapist - [facility]  Outpatient Physical Therapy- [place] [facility] [address] [place], [place], [address] Phone: [phone]   Fax:  [phone]

## 2022-04-04 ENCOUNTER — Ambulatory Visit: Payer: Medicare Other

## 2022-04-04 DIAGNOSIS — R278 Other lack of coordination: Secondary | ICD-10-CM

## 2022-04-04 DIAGNOSIS — Z8673 Personal history of transient ischemic attack (TIA), and cerebral infarction without residual deficits: Secondary | ICD-10-CM

## 2022-04-04 DIAGNOSIS — M6281 Muscle weakness (generalized): Secondary | ICD-10-CM

## 2022-04-04 DIAGNOSIS — M545 Low back pain, unspecified: Secondary | ICD-10-CM | POA: Diagnosis not present

## 2022-04-04 NOTE — Therapy (Signed)
[place] MAIN [facility] SERVICES [address] [place], [place], [address] Phone: [phone]   Fax:  [phone]  Patient Details  Name: David Reeves MRN: [id number] Date of Birth: 1970-11-19 Referring Provider:  [name], PA-C  Encounter Date: 04/04/2022   OUTPATIENT PHYSICAL THERAPY TREATMENT NOTE      Patient Name: David Reeves MRN: [id number] DOB:21-Apr-1971, 51 y.o., male Today's Date: 04/04/2022  PCP: [name], PA-C REFERRING PROVIDER: [name], PA-C   PT End of Session - 04/04/22 1338     Visit Number 74    Number of Visits 62    Date for PT Re-Evaluation 04/26/22    Authorization Type [employer]    Authorization Time Period 02/01/22-04/26/22    Progress Note Due on Visit 37    PT Start Time 1147    PT Stop Time 1231    PT Time Calculation (min) 44 min    Equipment Utilized During Treatment Gait belt    Activity Tolerance Patient tolerated treatment well;No increased pain    Behavior During Therapy [facility] for tasks assessed/performed                     Past Medical History:  Diagnosis Date   Back pain 04/22/2012   Bone spur    Bulging disc 04/22/2012   Degenerative disc disease    Osteoarthritis    Panic anxiety syndrome    Stroke ([place]) 10/2020   Taking multiple medications for chronic disease    Past Surgical History:  Procedure Laterality Date   CYSTECTOMY     head   HERNIA REPAIR Left 2006,2014   [facility]   TOE SURGERY Right 2007   Patient Active Problem List   Diagnosis Date Noted   Alterations of sensations following cerebrovascular accident 03/08/2021   Aphasia as late effect of cerebrovascular accident (CVA) 03/08/2021   Muscle spasm 02/22/2021   Acute non-recurrent frontal sinusitis 02/22/2021   Chronic pain syndrome 01/23/2021   Primary hypertension 01/23/2021   Nerve pain 01/23/2021   Erectile dysfunction due to diseases classified elsewhere  01/23/2021   Cognitive dysfunction 01/23/2021   Scrotal edema 01/23/2021   Elevated LDL cholesterol level 01/23/2021   History of stroke with residual deficit 01/23/2021   Primary insomnia 01/23/2021   Mouth pain 01/23/2021   Laceration of right hand without foreign body    Closed compression fracture of L1 vertebra ([place]) 05/24/2016   Lumbar stenosis with neurogenic claudication 11/01/2015   Chronic bilateral low back pain with right-sided sciatica 11/08/2014   Hx of hemorrhoids 11/08/2014   AAA (abdominal aortic aneurysm) ([place]) 09/22/2014   Chronic pain associated with significant psychosocial dysfunction 09/22/2014   Panic disorder 09/22/2014   AB (asthmatic bronchitis) 08/17/2014   Anxiety 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 08/17/2014   Backache 08/17/2014   Lumbosacral spondylosis without myelopathy 08/17/2014   Disorder of male genital organs 08/17/2014   Brash 08/17/2014   Low back pain 08/17/2014   Tendon nodule 08/17/2014   Episodic paroxysmal anxiety disorder 08/17/2014   Hernia, inguinal, right 08/17/2014   Fast heart beat 08/17/2014   Illness 08/17/2014   Inguinal hernia 10/14/2012    REFERRING DIAG: History of stroke with residual deficit, chronic bilateral low back pain with right-sided sciatica, Lumbosacral spondylosis without myelopathy THERAPY DIAG:  Muscle weakness (generalized)  Chronic bilateral low back pain without sciatica  Rationale for Evaluation and Treatment Rehabilitation  PERTINENT HISTORY: Pt. is a 51 y.o. who was diagnosed  with a CVA on July 21st, 2022. Pt. completed several weeks of inpatient rehab at [facility]. After returning to home. He was discharged in late August/early September and recieved home health PT. Pt. sustained a fall in december of 2022, and was admitted to the hospital with COVID-19, and Chronic back pain. He reports chronic back pain syndrome since 2012. Since the most recent discharge, Pt. has been residing with the ex-wife  until the Pt. is ready to return to independent living. He did recieve 2 weeks of home health after discharge in December 2022. He is now being referred to outpatient PT to address weakness from stroke and improve fine motor movement. He reports rarely getting numbness/tingling in RLE. PMHx includes: Bilateral LBP with right sided sciatica, Lumbosacral spondylosis myelopathy, closed compression Fx of L1. Has chronic insomnia. osteoarthritis, Panic anxiety syndrome (taking medication) Pt. enjoys cooking, and riding motorcycles. Patient is going to [facility] spine center for Epidural spinal injections on 06/07/21;  PRECAUTIONS: Fall, Spinal Brace wears daily, approximately 25%, when exercising; semi-rigid lumbar brace  SUBJECTIVE: Pt reports pain level is currently a 1/10, got updated medication after seeing physician per report. Pt is drowsy currently because he took his medication an hour ago (pt reports he usually takes it earlier). Pt reports his injection is still providing pain relief.    PAIN:  Are you having pain? Yes: NPRS scale: 3/10 Pain location: bilat low back Pain description: dull ache; always constant,  Aggravating factors: worse with sitting,  Relieving factors: heat helps temporarily, pain pills/pain patch   TODAY'S TREATMENT 04/04/22  TherEx  On mat table- Glute bridge 15x LTRs 10x each side Single leg bridge 10x each LE. Rates medium-hard   7.5# AW donned each LE SLR 2x15 each LE LTRs 10x each LE Side-ly hip abd 2x10 each LE rates medium-hard RLE  LTRs 10x each LE  Seated: LAQ 4# AW 2x10 each LE. Rates easy on RLE  March 4# AW 2x15 each LE   Seated thoracic ext over chair 10x  Reports no increase in back pain with interventions, pain remains at baseline  PATIENT EDUCATION:  Education details: Pt educated throughout session about proper posture and technique with exercises. Improved exercise technique, movement at target joints, use of target muscles after min to mod  verbal, visual, tactile cues.   Person educated: Patient Education method: Explanation, Demonstration, Tactile cues, and Verbal cues Education comprehension: verbalized understanding, returned demonstration, verbal cues required, tactile cues required, and needs further education   HOME EXERCISE PROGRAM: No updates on this date pt to continue HEP as previously given     PT Short Term Goals -       PT SHORT TERM GOAL #1   Title Patient will be adherent to HEP at least 3x a week to improve functional strength and balance for better safety at home.    Baseline 4/4: doing them 1-2x a week; 08/20/2021=Patient reports performing his low back stretching and no questions currently HEP. 10/24/21: doing HEP 4x a week   Time 4    Period Weeks    Status Achieved    Target Date 08/21/21      PT SHORT TERM GOAL #2   Title Patient (< 35 years old) will complete five times sit to stand test in < 15 seconds indicating an increased LE strength and improved balance.    Baseline 4/4: 17.85 sec with arms across chest; 08/20/2021= 17.6 sec , 7/5: 12.85 sec   Time 4    Period  Weeks    Status Achieved;    Target Date 10/01/21              PT Long Term Goals -  TARGET DATE FOR GOALS 04/26/2022      PT LONG TERM GOAL #1   Title Patient will increase six minute walk test distance to >1000 for progression to community ambulator and improve gait ability    Baseline 4/4: 1115 feet , 7/5: 1080 feet;    Time 4    Period Weeks    Status Achieved    Target Date 08/21/21      PT LONG TERM GOAL #2   Title Patient will ascend/descend 8 stairs without rail assist independently without loss of balance to improve ability to get in/out of home.    Baseline 4/4: requires 1 rail assist; 08/20/2021- Patient demonstrated steps without railing using reciprocal steps today. 7/5: ascend/descend 6 steps reciprocally without rail, but required CGA for safety when descending; 7/25: Able to complete without UE support and good  balance going up steps, unsafe descending steps without UE support. Required min assist and UE support to prevent LOB descending.; 8/15: requires UUE support to descend and close CGA due to; unsteadiness; 9/26: ascend without UE support, recip pattern; descend with UUE support, recip stepping, but still doesn't feel steady yet with descending; 10/13 able to demo recip pattern ascending hands free, required instance of UE support with descending, recip pattern due to heel getting caught under step; 11/14: deferred; 11/21: mod assist with descending due to unsteadiness, able to complete without UE support ascending   Time 12*corrected   Period Weeks    Status Partially met   Target Date      PT LONG TERM GOAL #3   Title Patient will increase BLE gross strength to 4+/5 as to improve functional strength for independent gait, increased standing tolerance and increased ADL ability.    Baseline 4/4: not formally assessed; 08/20/2021= 4+/5 with Right hip flex/knee ext/flex; Right hip abd= 4/5, 7/5: 4+/5 hip    Time 12    Period Weeks    Status Achieved;    Target Date 11/12/21      PT LONG TERM GOAL #4   Title Patient will improve FOTO score to >50% to indicate improved functional mobility with less pain with ADLs.    Baseline 4/4: 35%; 08/20/2021= 42% 7/5: 47%; 7:25:recently addressed; 11/30/21: 35; 9/26: 54%    Time 12    Period Weeks    Status MET   Target Date      PT LONG TERM GOAL #5   Title Patient will report a worst pain of 4/10 in low back over last week to indicate improved tolerance with ADLs.    Baseline 4/4: 7/10; 08/20/2021= 4/10 , 7/5: worst 7/10 in low back; 7/25: 6/10 ; 8/15: pt reports 6/10; 9/26: 4/10   Time 12    Period Weeks    Status Achieved    Target Date 02/05/2022      Additional Long Term Goals   Additional Long Term Goals Yes      PT LONG TERM GOAL #6   Title Pt will increase 10MWT by at least 0.13 m/s in order to demonstrate clinically significant improvement in  community ambulation    Baseline 08/20/2021=0.94 m/s 7/5: 1.5 m/s   Time 12    Period Weeks    Status Achieved;    Target Date 11/12/21      PT LONG TERM GOAL #7  Title Patient will demonstrate improved static standing balance as seen by ability to single leg stance on right LE > 12 sec consistently for optimal balance on level and unlevel surfaces.    Baseline 08/20/2021= 4 sec SLS on right , 7/5: 4-5 sec inconsistent; 7/25 able to achieve 15 sec after multiple attempts of 4-5 sec holds; 8/15: LLE 15 sec, RLE 8 sec; 9/26: LLE 30 sec, RLE 17 seconds   Time 12    Period Weeks    Status MET   Target Date 02/05/2022    PT LONG TERM GOAL #8  Title Pt will report ability to ambulate for at least 15 minutes without an increase in his LBP in order to improve community participation and ease with ADLs  Baseline 9/26: increase in pain at 15 min mark; 10/13: reports about 20 minutes.   Time 3   Period Weeks   Status MET  Target Date 02/05/2022        PT LONG TERM GOAL #9  Title Pt will report ability to ambulate for at least 45 minutes without an increase in his LBP in order to improve community participation and ease with ADLs  Baseline 9/26: increase in pain at 15 min mark; 10/13: reports about 20 minutes. 11/14: deferred; 11/21: Unable to complete 45 min gait without pain, but does report this has improved. Thinks he can do 30 min before pain increase.  Time 3   Period Weeks   Status ONGOING  Target Date        Plan -     Clinical Impression Statement Pt presents with 1/10 LBP. He is able to perform interventions in supine, side-ly, and seated without pain increase. Instructed pt to monitor pain levels later today and tomorrow to determine exercise response. If pt pain continues to remain at baseline plan to trial more standing strengthening therex to determine if pt has improved tolerance for these activities. Pt will continue to benefit from skilled physical therapy intervention to  address impairments, improve QOL, and attain therapy goals.     Personal Factors and Comorbidities Comorbidity 3+;Past/Current Experience;Time since onset of injury/illness/exacerbation    Comorbidities PMHx includes: Bilateral LBP with right sided sciatica, Lumbosacral spondylosis myelopathy, closed compression Fx of L1. Has chronic insomnia. osteoarthritis, Panic anxiety syndrome (taking medication)    Examination-Activity Limitations Bend;Carry;Locomotion Level;Sit;Squat;Stairs;Stand;Transfers    Examination-Participation Restrictions Cleaning;Community Activity;Driving;Laundry;Meal Prep;Occupation;Shop;Volunteer;Yard Work    Stability/Clinical Decision Making Stable/Uncomplicated    Rehab Potential Good    PT Frequency 2x / week    PT Duration 12 weeks    PT Treatment/Interventions Cryotherapy;Electrical Stimulation;Moist Heat;Traction;Gait training;Stair training;Functional mobility training;Therapeutic activities;Therapeutic exercise;Balance training;Neuromuscular re-education;Patient/family education;Manual techniques;Passive range of motion;Dry needling;Energy conservation    PT Next Visit Plan  Progress core stabilization, Manual therapy for low back and LE ROM, instruct in balance HEP, continue plan       Consulted and Agree with Plan of Care Patient              1:41 PM, 04/04/22 Zollie Pee PT    Physical Therapist - [facility]  Outpatient Physical Therapy- [place] [address] [place], [place], [address] Phone: [phone]   Fax:  [phone]

## 2022-04-04 NOTE — Therapy (Signed)
OUTPATIENT OCCUPATIONAL THERAPY NEURO TREATMENT NOTE   Patient Name: David Reeves MRN: [id number] DOB:11-30-1970, 51 y.o., male Today's Date: 04/04/2022  PCP: [name], PA-C REFERRING PROVIDER: [name], PA-C   OT End of Session - 04/04/22 1348     Visit Number 21    Number of Visits 55    Date for OT Re-Evaluation 04/16/22    Authorization Type Progress reporting period starting 03/01/22    OT Start Time 69    OT Stop Time 1143    OT Time Calculation (min) 43 min    Activity Tolerance Patient tolerated treatment well    Behavior During Therapy [facility] for tasks assessed/performed              Past Medical History:  Diagnosis Date   Back pain 04/22/2012   Bone spur    Bulging disc 04/22/2012   Degenerative disc disease    Osteoarthritis    Panic anxiety syndrome    Stroke ([place]) 10/2020   Taking multiple medications for chronic disease    Past Surgical History:  Procedure Laterality Date   CYSTECTOMY     head   HERNIA REPAIR Left 2006,2014   [facility]   TOE SURGERY Right 2007   Patient Active Problem List   Diagnosis Date Noted   Alterations of sensations following cerebrovascular accident 03/08/2021   Aphasia as late effect of cerebrovascular accident (CVA) 03/08/2021   Muscle spasm 02/22/2021   Acute non-recurrent frontal sinusitis 02/22/2021   Chronic pain syndrome 01/23/2021   Primary hypertension 01/23/2021   Nerve pain 01/23/2021   Erectile dysfunction due to diseases classified elsewhere 01/23/2021   Cognitive dysfunction 01/23/2021   Scrotal edema 01/23/2021   Elevated LDL cholesterol level 01/23/2021   History of stroke with residual deficit 01/23/2021   Primary insomnia 01/23/2021   Mouth pain 01/23/2021   Laceration of right hand without foreign body    Closed compression fracture of L1 vertebra ([place]) 05/24/2016   Lumbar stenosis with neurogenic claudication 11/01/2015   Chronic bilateral low back pain with right-sided sciatica  11/08/2014   Hx of hemorrhoids 11/08/2014   AAA (abdominal aortic aneurysm) ([place]) 09/22/2014   Chronic pain associated with significant psychosocial dysfunction 09/22/2014   Panic disorder 09/22/2014   AB (asthmatic bronchitis) 08/17/2014   Anxiety 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 08/17/2014   Backache 08/17/2014   Lumbosacral spondylosis without myelopathy 08/17/2014   Disorder of male genital organs 08/17/2014   Brash 08/17/2014   Low back pain 08/17/2014   Tendon nodule 08/17/2014   Episodic paroxysmal anxiety disorder 08/17/2014   Hernia, inguinal, right 08/17/2014   Fast heart beat 08/17/2014   Illness 08/17/2014   Inguinal hernia 10/14/2012   ONSET DATE: 11/09/2020  REFERRING DIAG: CVA  THERAPY DIAG:  Muscle weakness (generalized)  Other lack of coordination  H/O ischemic left MCA stroke  Rationale for Evaluation and Treatment Rehabilitation  PERTINENT HISTORY: Pt. is a 51 y.o. who was diagnosed with a CVA on July 21st, 2022. Pt. completed several weeks of  inpatient rehab at [facility]. After returning to home, Pt. sustained a fall in december of 2022, and was admitted to the hospital with COVID-19, and back pain from the fall. Since the most recent discharge, Pt. has been residing with the ex-wife until the Pt. is ready to return to independent living. Pt. PMHx includes: Bilateral LBP with right sided sciatica, Lumbosacral spondylosis myelopathy, closed compression Fx of L1. Pt. enjoys cooking, and riding motorcycles.   PRECAUTIONS: Fall  SUBJECTIVE: Pt reports he took his medicine later than normal this morning.  Distal RUE appeared slightly more spastic this morning, but then meds seemed to kick in during therapy as pt was noticeably drowsy.    PAIN:  Are you having pain? Yes: NPRS scale: 1/10 Pain location: low back Pain description: aching Aggravating factors: prolonged sitting or lying down Relieving factors: rest, heat, walking  [facility] OT Assessment         12/11/21 01/22/22 03/01/22             Observation/Other Assessments      Focus on Therapeutic Outcomes (FOTO)   46 50 49             Coordination      Right 9 Hole Peg Test unable  unable unable    Left 9 Hole Peg Test 30 sec  30 sec NT             Hand Function      Right Hand Grip (lbs) 35 (3rd setting) 30 lb (3rd);  41 lb (4th setting),  44 lbs (4th setting)    Right Hand Lateral Pinch 9 lbs   slipping 12 lbs (thumb slips) 16 lbs (multiple trials d/t thumb slips)    Right Hand 3 Point Pinch   unable to position thumb to produce 3 pt pinch Unable to position thumb to produce 3 point pinch unable    Left Hand Grip (lbs) 116       Left Hand Lateral Pinch 29 lbs       Left 3 point pinch 27 lbs       OBJECTIVE:  TODAY'S TREATMENT:  Therapeutic Exercise: Participated in place and hold for thumb radial abd, resisted lateral pinch pushing into yellow clothespin (mod A to maintain fingers in position), active assisted palmar abd, active assisted thumb IP extension, and active digit flex/ext x2 sets 7-10 reps.  Performed active digit abd and active assisted add, working to strengthen intrinsics x2 sets 7 reps each. Facilitated 3 point pinch, pushing into yellow clothespin (max A to maintain fingers in position) for 2 sets 10 reps each. Practiced active assisted digit opposition of R IF and LF to thumb separately, and then together to formulate a 3 point pinch pattern.  Pt requires rest breaks between each set for above noted exercises.      Neuro re-ed: Practiced 3 point pinch patterns placing and removing circular pegs from pegboard.  Pt required repeated repositioning of fingers on peg and mod A to place/remove a peg without fingers slipping today.  Pt was unable to successfully complete a rep on his own today, but question if medication timing played a role in this as pt was very drowsy.   PATIENT EDUCATION: Education details: HEP progression, grasp/release activities; wrist flex against  gravity, lateral and 3 point pinch with foam dowel Person educated: Patient Education method: Explanation Education comprehension: verbalized and demonstrated understanding.      OT Short Term Goals -      OT SHORT TERM GOAL #1   Title Pt. wil demonstrtae independence with HEPs    Baseline Eval: Pt. currently does not have one, 10th visit:  changes in HEP ongoing; 20th: instructed in putty exercises, changes ongoing; 40th: continual updates to HEP as pt progresses; 50th: continual updates; 01/22/22: continual updates; 03/01/22: continual updates   Time 6    Period Weeks    Status On-going    Target Date 03/05/22  OT Long Term Goals -       OT LONG TERM GOAL #1   Title Pt. will improve RUE strength by 2 mm grades to assist with ADLs, and IADLs,    Baseline Eval: Right shoulder flexion, abduction 3/5, elbow flexion, extension wrist extension 3+/5, 10th visit: improving with RUE strength by not yet met goal; 20th visit: R shd flex/abd 4-, elbow flex/ext 4+, wrist flex 4-, wrist ext 4+; 40:  R shd flex/abd 4,  elbow flex/ext 4+, wrist flex 4, wrist ext 4+; 50th visit 8/22: R shoulder flex/abd 4/5, elbow flex/ext 5/5, wrist flex 4, wrist ext 4+; 01/22/22: shoulder flex/abd 4/5, elbow flex/ext 5/5, wrist flex 4, wrist ext 4+; 03/01/22: same as 01/22/22   Time 12    Period Weeks    Status On-going    Target Date 04/16/22      OT LONG TERM GOAL #2   Title Pt. will improve right grip by 10 lbs to prepare for firmly holding objects for IADLs.    Baseline Eval: R: 38#, L: 124# (In 3rd dynamometer slot); R 35# in 4th slot, 28# in 3rd slot, 40th: 47 in 4th slot; 50th: 12/11/21: 46# on 3rd slot; 01/22/22: 30# on 3rd slot; 03/01/22: 44# 4th slot   Time 12    Period Weeks    Status On-going    Target Date 04/16/22      OT LONG TERM GOAL #3   Title Pt. will improve right lateral pinch strength by 5# to assist with cutting food    Baseline Eval: R: 17, L: 25; 20th: R: 18; 40th: 15#  fingers slipping on meter, 50th: 9# fingers slipping; 01/22/22: 12# fingers slipping; 03/01/22: 16#   Time 12    Period Weeks    Status On-going    Target Date 04/16/22      OT LONG TERM GOAL #4   Title Pt. will improve Right 3pt pinch by 2# to be able to hold/open items for cooking    Baseline Eval: R: Pt. unable to engage thumb L: 29#; 20th: R unable; 40th:  unable, fingers slipping; 50th:  unable, fingers slipping; 01/22/22: fingers slipping but pt is now able to grasp a ball peg with 3 point pinch pattern; 03/01/22: same as 10/3 in addition to being able to pick up ball peg from pegboard then place peg back into pegboard with a 3 point pinch pattern.   Time 12    Period Weeks    Status On-going    Target Date 04/16/22      OT LONG TERM GOAL #5   Title Pt. will improve right hand [facility] skills to be able to independently manipulate buttons, and zippers.    Baseline Eval: Pt. has difficulty managing buttons,a nd zippers. 10th visit:  improving; 20th: unable to engage R hand effectively, performs 1 handed with L; 50th: performs L handed only; 01/22/22: pt can use R hand as a stabilizer with clothing fasteners with mod vc; 03/01/22: same as 01/22/22   Time 12    Period Weeks    Status On-going    Target Date 04/16/22      OT LONG TERM GOAL #6   Title Pt. will independently write his name    Baseline Eval: Pt. is unable to hold a pen; 20th: pt can grip PenAgain with R hand and can make marks on paper with difficulty, but not yet writing name; 40th:  continues to demonstrate difficulty with grasp of pen; 50th 12/11/21: With assist  to set up hand on pen again, pt was able to write first name and initial of last name in 1 1/4" tall letters with fair legibility (pt wants to defer this goal, states this is low on his list of priorities for now)   Time 12    Period Weeks    Status Defer   Target Date 04/16/22      OT LONG TERM GOAL #7   Title Pt. will improve FOTO score by 2 points to reflect  funational improvement    Baseline Eval: FOTO score 46 with TR score 52; 20th: 45; 40th: 44;  50th: 46; 01/22/22: 50; 03/01/22: 49   Time 12    Period Weeks    Status On-going    Target Date 04/16/22             Plan -    Clinical Impression Statement Pt reports he took his medicine later than normal this morning.  Distal RUE appeared slightly more spastic this morning, but then meds seemed to kick in during therapy as pt was noticeably drowsy.   Pt required frequent rest breaks with all activities this date.  Pt was unable to successfully complete a rep on his own today of removing a circular peg from pegboard without thumb slipping, but question if medication timing played a role in this as pt was very drowsy.  Pt will continue to work towards neuro re-ed of right hand with emphasis on thumb movements to integrate into functional grasping and manipulation skills.    OT Occupational Profile and History Detailed Assessment- Review of Records and additional review of physical, cognitive, psychosocial history related to current functional performance    Occupational performance deficits (Please refer to evaluation for details): ADL's;IADL's;Education    Rehab Potential Good    Clinical Decision Making Several treatment options, min-mod task modification necessary    Comorbidities Affecting Occupational Performance: May have comorbidities impacting occupational performance    Modification or Assistance to Complete Evaluation  Min-Moderate modification of tasks or assist with assess necessary to complete eval    OT Frequency 2x / week    OT Duration 12 weeks    OT Treatment/Interventions Self-care/ADL training;DME and/or AE instruction;Therapeutic exercise;Ultrasound;Neuromuscular education;Therapeutic activities;Energy conservation;Moist Heat;Patient/family education;Splinting;Functional Mobility Training;Paraffin    Plan Neuro re-ed and therapeutic exercise    Consulted and Agree with Plan of  Care Patient             Leta Speller, MS, OTR/L   Darleene Cleaver, OT 04/04/2022, 1:49 PM

## 2022-04-07 ENCOUNTER — Other Ambulatory Visit: Payer: Self-pay | Admitting: Physician Assistant

## 2022-04-07 DIAGNOSIS — G8929 Other chronic pain: Secondary | ICD-10-CM

## 2022-04-09 ENCOUNTER — Ambulatory Visit: Payer: Medicare Other

## 2022-04-09 ENCOUNTER — Other Ambulatory Visit: Payer: Self-pay | Admitting: Physician Assistant

## 2022-04-09 ENCOUNTER — Encounter: Payer: Self-pay | Admitting: Physician Assistant

## 2022-04-09 DIAGNOSIS — M62838 Other muscle spasm: Secondary | ICD-10-CM

## 2022-04-09 DIAGNOSIS — R262 Difficulty in walking, not elsewhere classified: Secondary | ICD-10-CM

## 2022-04-09 DIAGNOSIS — M6281 Muscle weakness (generalized): Secondary | ICD-10-CM

## 2022-04-09 DIAGNOSIS — M545 Low back pain, unspecified: Secondary | ICD-10-CM | POA: Diagnosis not present

## 2022-04-09 DIAGNOSIS — Z8673 Personal history of transient ischemic attack (TIA), and cerebral infarction without residual deficits: Secondary | ICD-10-CM

## 2022-04-09 DIAGNOSIS — R2681 Unsteadiness on feet: Secondary | ICD-10-CM

## 2022-04-09 DIAGNOSIS — I6930 Unspecified sequelae of cerebral infarction: Secondary | ICD-10-CM

## 2022-04-09 DIAGNOSIS — R278 Other lack of coordination: Secondary | ICD-10-CM

## 2022-04-09 NOTE — Therapy (Signed)
[place] MAIN [facility] SERVICES [address] [place], [place], [address] Phone: [phone]   Fax:  [phone]  Patient Details  Name: David Reeves MRN: [id number] Date of Birth: [birth year] Referring Provider:  [name], PA-C  Encounter Date: 04/09/2022   OUTPATIENT PHYSICAL THERAPY TREATMENT NOTE      Patient Name: David Reeves MRN: [id number] DOB:[birth year], 51 y.o., male Today's Date: 04/09/2022  PCP: [name], PA-C REFERRING PROVIDER: [name], PA-C   PT End of Session - 04/09/22 0843     Visit Number 35    Number of Visits 48    Date for PT Re-Evaluation 04/26/22    Authorization Type [employer]    Authorization Time Period 02/01/22-04/26/22    Progress Note Due on Visit 21    PT Start Time 0842    PT Stop Time 0923    PT Time Calculation (min) 41 min    Equipment Utilized During Treatment Gait belt    Activity Tolerance Patient tolerated treatment well;No increased pain    Behavior During Therapy [facility] for tasks assessed/performed                      Past Medical History:  Diagnosis Date   Back pain 04/22/2012   Bone spur    Bulging disc 04/22/2012   Degenerative disc disease    Osteoarthritis    Panic anxiety syndrome    Stroke ([place]) 10/2020   Taking multiple medications for chronic disease    Past Surgical History:  Procedure Laterality Date   CYSTECTOMY     head   HERNIA REPAIR Left 2006,2014   [facility]   TOE SURGERY Right 2007   Patient Active Problem List   Diagnosis Date Noted   Alterations of sensations following cerebrovascular accident 03/08/2021   Aphasia as late effect of cerebrovascular accident (CVA) 03/08/2021   Muscle spasm 02/22/2021   Acute non-recurrent frontal sinusitis 02/22/2021   Chronic pain syndrome 01/23/2021   Primary hypertension 01/23/2021   Nerve pain 01/23/2021   Erectile dysfunction due to diseases classified elsewhere  01/23/2021   Cognitive dysfunction 01/23/2021   Scrotal edema 01/23/2021   Elevated LDL cholesterol level 01/23/2021   History of stroke with residual deficit 01/23/2021   Primary insomnia 01/23/2021   Mouth pain 01/23/2021   Laceration of right hand without foreign body    Closed compression fracture of L1 vertebra ([place]) 05/24/2016   Lumbar stenosis with neurogenic claudication 11/01/2015   Chronic bilateral low back pain with right-sided sciatica 11/08/2014   Hx of hemorrhoids 11/08/2014   AAA (abdominal aortic aneurysm) ([place]) 09/22/2014   Chronic pain associated with significant psychosocial dysfunction 09/22/2014   Panic disorder 09/22/2014   AB (asthmatic bronchitis) 08/17/2014   Anxiety 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 08/17/2014   Backache 08/17/2014   Lumbosacral spondylosis without myelopathy 08/17/2014   Disorder of male genital organs 08/17/2014   Brash 08/17/2014   Low back pain 08/17/2014   Tendon nodule 08/17/2014   Episodic paroxysmal anxiety disorder 08/17/2014   Hernia, inguinal, right 08/17/2014   Fast heart beat 08/17/2014   Illness 08/17/2014   Inguinal hernia 10/14/2012    REFERRING DIAG: History of stroke with residual deficit, chronic bilateral low back pain with right-sided sciatica, Lumbosacral spondylosis without myelopathy THERAPY DIAG:  Muscle weakness (generalized)  Difficulty in walking, not elsewhere classified  Unsteadiness on feet  Rationale for Evaluation and Treatment Rehabilitation  PERTINENT HISTORY: Pt. is a 50  y.o. who was diagnosed with a CVA on July 21st, 2022. Pt. completed several weeks of inpatient rehab at [facility]. After returning to home. He was discharged in late August/early September and recieved home health PT. Pt. sustained a fall in december of 2022, and was admitted to the hospital with COVID-19, and Chronic back pain. He reports chronic back pain syndrome since 2012. Since the most recent discharge, Pt. has been  residing with the ex-wife until the Pt. is ready to return to independent living. He did recieve 2 weeks of home health after discharge in December 2022. He is now being referred to outpatient PT to address weakness from stroke and improve fine motor movement. He reports rarely getting numbness/tingling in RLE. PMHx includes: Bilateral LBP with right sided sciatica, Lumbosacral spondylosis myelopathy, closed compression Fx of L1. Has chronic insomnia. osteoarthritis, Panic anxiety syndrome (taking medication) Pt. enjoys cooking, and riding motorcycles. Patient is going to [facility] spine center for Epidural spinal injections on 06/07/21;  PRECAUTIONS: Fall, Spinal Brace wears daily, approximately 25%, when exercising; semi-rigid lumbar brace  SUBJECTIVE: Pt reports pain level is currently a 1/10. Pt reports he met with physician and they are upping dose of pain patch.    PAIN:  Are you having pain? Yes: NPRS scale: 1/10 Pain location: bilat low back Pain description: dull ache; always constant,  Aggravating factors: worse with sitting,  Relieving factors: heat helps temporarily, pain pills/pain patch   TODAY'S TREATMENT 04/09/22  TherEx  On mat table- LTRs 10x Single leg bridge 12x each LE. Rates medium Glute bridge 15x  10# AW donned each LE SLR 2x10 each LE. Rates medium-hard on RLE  Side-ly hip abd 2x10 each LE rates medium due to coordination    Standing hip abd 2x12 Standing march 2x12  Treadmill LE endurance training, performs up to 5% elevation, 1 mph, for total of 5 min    NMR:  SLB 2x30 sec each LE with RUE support to promote use of sensory feedback on affected side Tandem stance 30 sec each LE - then progressed to with vertical and horizontal head turns each LE  Reports no increase in back pain with all interventions, pain remains at baseline  PATIENT EDUCATION:  Education details: Pt educated throughout session about proper posture and technique with exercises. Improved  exercise technique, movement at target joints, use of target muscles after min to mod verbal, visual, tactile cues.   Person educated: Patient Education method: Explanation, Demonstration, Tactile cues, and Verbal cues Education comprehension: verbalized understanding, returned demonstration, verbal cues required, tactile cues required, and needs further education   HOME EXERCISE PROGRAM: No updates on this date pt to continue HEP as previously given     PT Short Term Goals -       PT SHORT TERM GOAL #1   Title Patient will be adherent to HEP at least 3x a week to improve functional strength and balance for better safety at home.    Baseline 4/4: doing them 1-2x a week; 08/20/2021=Patient reports performing his low back stretching and no questions currently HEP. 10/24/21: doing HEP 4x a week   Time 4    Period Weeks    Status Achieved    Target Date 08/21/21      PT SHORT TERM GOAL #2   Title Patient (< 59 years old) will complete five times sit to stand test in < 15 seconds indicating an increased LE strength and improved balance.    Baseline 4/4: 17.85 sec with  arms across chest; 08/20/2021= 17.6 sec , 7/5: 12.85 sec   Time 4    Period Weeks    Status Achieved;    Target Date 10/01/21              PT Long Term Goals -  TARGET DATE FOR GOALS 04/26/2022      PT LONG TERM GOAL #1   Title Patient will increase six minute walk test distance to >1000 for progression to community ambulator and improve gait ability    Baseline 4/4: 1115 feet , 7/5: 1080 feet;    Time 4    Period Weeks    Status Achieved    Target Date 08/21/21      PT LONG TERM GOAL #2   Title Patient will ascend/descend 8 stairs without rail assist independently without loss of balance to improve ability to get in/out of home.    Baseline 4/4: requires 1 rail assist; 08/20/2021- Patient demonstrated steps without railing using reciprocal steps today. 7/5: ascend/descend 6 steps reciprocally without rail, but  required CGA for safety when descending; 7/25: Able to complete without UE support and good balance going up steps, unsafe descending steps without UE support. Required min assist and UE support to prevent LOB descending.; 8/15: requires UUE support to descend and close CGA due to; unsteadiness; 9/26: ascend without UE support, recip pattern; descend with UUE support, recip stepping, but still doesn't feel steady yet with descending; 10/13 able to demo recip pattern ascending hands free, required instance of UE support with descending, recip pattern due to heel getting caught under step; 11/14: deferred; 11/21: mod assist with descending due to unsteadiness, able to complete without UE support ascending   Time 12*corrected   Period Weeks    Status Partially met   Target Date      PT LONG TERM GOAL #3   Title Patient will increase BLE gross strength to 4+/5 as to improve functional strength for independent gait, increased standing tolerance and increased ADL ability.    Baseline 4/4: not formally assessed; 08/20/2021= 4+/5 with Right hip flex/knee ext/flex; Right hip abd= 4/5, 7/5: 4+/5 hip    Time 12    Period Weeks    Status Achieved;    Target Date 11/12/21      PT LONG TERM GOAL #4   Title Patient will improve FOTO score to >50% to indicate improved functional mobility with less pain with ADLs.    Baseline 4/4: 35%; 08/20/2021= 42% 7/5: 47%; 7:25:recently addressed; 11/30/21: 35; 9/26: 54%    Time 12    Period Weeks    Status MET   Target Date      PT LONG TERM GOAL #5   Title Patient will report a worst pain of 4/10 in low back over last week to indicate improved tolerance with ADLs.    Baseline 4/4: 7/10; 08/20/2021= 4/10 , 7/5: worst 7/10 in low back; 7/25: 6/10 ; 8/15: pt reports 6/10; 9/26: 4/10   Time 12    Period Weeks    Status Achieved    Target Date 02/05/2022      Additional Long Term Goals   Additional Long Term Goals Yes      PT LONG TERM GOAL #6   Title Pt will increase  10MWT by at least 0.13 m/s in order to demonstrate clinically significant improvement in community ambulation    Baseline 08/20/2021=0.94 m/s 7/5: 1.5 m/s   Time 12    Period Weeks    Status  Achieved;    Target Date 11/12/21      PT LONG TERM GOAL #7   Title Patient will demonstrate improved static standing balance as seen by ability to single leg stance on right LE > 12 sec consistently for optimal balance on level and unlevel surfaces.    Baseline 08/20/2021= 4 sec SLS on right , 7/5: 4-5 sec inconsistent; 7/25 able to achieve 15 sec after multiple attempts of 4-5 sec holds; 8/15: LLE 15 sec, RLE 8 sec; 9/26: LLE 30 sec, RLE 17 seconds   Time 12    Period Weeks    Status MET   Target Date 02/05/2022    PT LONG TERM GOAL #8  Title Pt will report ability to ambulate for at least 15 minutes without an increase in his LBP in order to improve community participation and ease with ADLs  Baseline 9/26: increase in pain at 15 min mark; 10/13: reports about 20 minutes.   Time 3   Period Weeks   Status MET  Target Date 02/05/2022        PT LONG TERM GOAL #9  Title Pt will report ability to ambulate for at least 45 minutes without an increase in his LBP in order to improve community participation and ease with ADLs  Baseline 9/26: increase in pain at 15 min mark; 10/13: reports about 20 minutes. 11/14: deferred; 11/21: Unable to complete 45 min gait without pain, but does report this has improved. Thinks he can do 30 min before pain increase.  Time 3   Period Weeks   Status ONGOING  Target Date        Plan -     Clinical Impression Statement Pt initiated phase of rehab using treadmill endurance training at incline as well as more standing interventions. Back pain remained at baseline. He rated exercises generally as easy-medium. This indicates improved activity tolerance. While the pt shows progress he still exhibits decreased balance. The pt will continue to benefit from skilled physical  therapy intervention to address impairments, improve QOL, and attain therapy goals.     Personal Factors and Comorbidities Comorbidity 3+;Past/Current Experience;Time since onset of injury/illness/exacerbation    Comorbidities PMHx includes: Bilateral LBP with right sided sciatica, Lumbosacral spondylosis myelopathy, closed compression Fx of L1. Has chronic insomnia. osteoarthritis, Panic anxiety syndrome (taking medication)    Examination-Activity Limitations Bend;Carry;Locomotion Level;Sit;Squat;Stairs;Stand;Transfers    Examination-Participation Restrictions Cleaning;Community Activity;Driving;Laundry;Meal Prep;Occupation;Shop;Volunteer;Yard Work    Stability/Clinical Decision Making Stable/Uncomplicated    Rehab Potential Good    PT Frequency 2x / week    PT Duration 12 weeks    PT Treatment/Interventions Cryotherapy;Electrical Stimulation;Moist Heat;Traction;Gait training;Stair training;Functional mobility training;Therapeutic activities;Therapeutic exercise;Balance training;Neuromuscular re-education;Patient/family education;Manual techniques;Passive range of motion;Dry needling;Energy conservation    PT Next Visit Plan  Progress core stabilization, Manual therapy for low back and LE ROM, instruct in balance HEP, continue plan       Consulted and Agree with Plan of Care Patient              9:33 AM, 04/09/22 Zollie Pee PT    Physical Therapist - [facility]  Outpatient Physical Therapy- [facility] [place] [address] [place], [place], [address] Phone: [phone]   Fax:  [phone]

## 2022-04-09 NOTE — Therapy (Signed)
OUTPATIENT OCCUPATIONAL THERAPY NEURO RECERTIFICATION/PROGRESS UPDATE AND TREATMENT NOTE Progress reporting period starting 03/01/22-04/09/22   Patient Name: David Reeves MRN: [id number] DOB:Mar 08, 1971, 51 y.o., male Today's Date: 04/09/2022  PCP: [name], PA-C REFERRING PROVIDER: [name], PA-C   OT End of Session - 04/09/22 9326     Visit Number [id number]    Number of Visits 104    Date for OT Re-Evaluation 07/02/22    Authorization Type Progress reporting period starting 03/01/22    OT Start Time 0930    OT Stop Time 1015    OT Time Calculation (min) 45 min    Activity Tolerance Patient tolerated treatment well    Behavior During Therapy [facility] for tasks assessed/performed             Past Medical History:  Diagnosis Date   Back pain 04/22/2012   Bone spur    Bulging disc 04/22/2012   Degenerative disc disease    Osteoarthritis    Panic anxiety syndrome    Stroke ([place]) 10/2020   Taking multiple medications for chronic disease    Past Surgical History:  Procedure Laterality Date   CYSTECTOMY     head   HERNIA REPAIR Left 2006,2014   [facility]   TOE SURGERY Right 2007   Patient Active Problem List   Diagnosis Date Noted   Alterations of sensations following cerebrovascular accident 03/08/2021   Aphasia as late effect of cerebrovascular accident (CVA) 03/08/2021   Muscle spasm 02/22/2021   Acute non-recurrent frontal sinusitis 02/22/2021   Chronic pain syndrome 01/23/2021   Primary hypertension 01/23/2021   Nerve pain 01/23/2021   Erectile dysfunction due to diseases classified elsewhere 01/23/2021   Cognitive dysfunction 01/23/2021   Scrotal edema 01/23/2021   Elevated LDL cholesterol level 01/23/2021   History of stroke with residual deficit 01/23/2021   Primary insomnia 01/23/2021   Mouth pain 01/23/2021   Laceration of right hand without foreign body    Closed compression fracture of L1 vertebra ([place]) 05/24/2016   Lumbar stenosis with  neurogenic claudication 11/01/2015   Chronic bilateral low back pain with right-sided sciatica 11/08/2014   Hx of hemorrhoids 11/08/2014   AAA (abdominal aortic aneurysm) ([place]) 09/22/2014   Chronic pain associated with significant psychosocial dysfunction 09/22/2014   Panic disorder 09/22/2014   AB (asthmatic bronchitis) 08/17/2014   Anxiety disorder due to general medical condition 08/17/2014   Backache 08/17/2014   Lumbosacral spondylosis without myelopathy 08/17/2014   Disorder of male genital organs 08/17/2014   Brash 08/17/2014   Low back pain 08/17/2014   Tendon nodule 08/17/2014   Episodic paroxysmal anxiety disorder 08/17/2014   Hernia, inguinal, right 08/17/2014   Fast heart beat 08/17/2014   Illness 08/17/2014   Inguinal hernia 10/14/2012   ONSET DATE: 11/09/2020  REFERRING DIAG: CVA  THERAPY DIAG:  Muscle weakness (generalized)  Other lack of coordination  H/O ischemic left MCA stroke  Rationale for Evaluation and Treatment Rehabilitation  PERTINENT HISTORY: Pt. is a 51 y.o. who was diagnosed with a CVA on July 21st, 2022. Pt. completed several weeks of  inpatient rehab at [facility]. After returning to home, Pt. sustained a fall in december of 2022, and was admitted to the hospital with COVID-19, and back pain from the fall. Since the most recent discharge, Pt. has been residing with the ex-wife until the Pt. is ready to return to independent living. Pt. PMHx includes: Bilateral LBP with right sided sciatica, Lumbosacral spondylosis myelopathy, closed compression Fx of L1. Pt. enjoys cooking,  and riding motorcycles.   PRECAUTIONS: Fall  SUBJECTIVE: Pt reports doing well today.  Pt stated that he understands spasticity has been limiting progress with use of the R hand and pt is receptive to neurology appointment to see if Botox may be helpful.   PAIN:  Are you having pain? Yes: NPRS scale: 1/10 Pain location: low back Pain description: aching Aggravating factors:  prolonged sitting or lying down Relieving factors: rest, heat, walking  [facility] OT Assessment        12/11/21 01/22/22 03/01/22 04/09/22              Observation/Other Assessments       Focus on Therapeutic Outcomes (FOTO)   46 50 49 49              Coordination       Right 9 Hole Peg Test unable  unable unable Unable (was able to remove 1 peg from hole but unable to pick up peg from dish)    Left 9 Hole Peg Test 30 sec  30 sec NT NT (normal)              Hand Function       Right Hand Grip (lbs) 35 (3rd setting) 30 lb (3rd);  41 lb (4th setting),  44 lbs (4th setting) 46 lbs (4th setting)    Right Hand Lateral Pinch 9 lbs   slipping 12 lbs (thumb slips) 16 lbs (multiple trials d/t thumb slips) 4 lbs (multiple trials d/t thumb slips)    Right Hand 3 Point Pinch   unable to position thumb to produce 3 pt pinch Unable to position thumb to produce 3 point pinch unable Unable (fingers slip)    Left Hand Grip (lbs) 116        Left Hand Lateral Pinch 29 lbs        Left 3 point pinch 27 lbs        OBJECTIVE:  TODAY'S TREATMENT:  Therapeutic Exercise: Objective measures taken and goals updated for progress update/recertification.  OT reviewed HEP d/t plan to place pt on hold until pt can follow up with neurology for possible Botox tx to manage spasticity in the RUE.  OT reinforced ongoing thumb ext and abd exercises, 3 point pinching working with blocks and pegs, gross grasping/releasing with focus on delaying thumb flexion to grip items without thumb tucked under fingers.  Pt is able to return demo of above exercises.    Neuro re-ed: Practiced R hand 3 point pinch patterns placing and removing ball pegs from pegboard.  Pt required repeated repositioning of fingers on pegs to prevent fingers from slipping and vc to engage the R LF tip into prehension on the peg.    PATIENT EDUCATION: Education details: HEP review Person educated: Patient Education method: Explanation Education comprehension:  verbalized and demonstrated understanding.      OT Short Term Goals - 6 weeks      OT SHORT TERM GOAL #1   Title Pt. wil demonstrtae independence with HEPs    Baseline Eval: Pt. currently does not have one, 10th visit:  changes in HEP ongoing; 20th: instructed in putty exercises, changes ongoing; 40th: continual updates to HEP as pt progresses; 50th: continual updates; 01/22/22: continual updates; 03/01/22: continual updates; 04/09/22: pt demo indep with current hand strengthening and coordination exercises; will adjust as needed if pt is able to schedule Botox injections   Time 6    Period Weeks    Status On-going  Target Date 05/21/22            OT Long Term Goals - 12 weeks      OT LONG TERM GOAL #1   Title Pt. will improve RUE strength by 2 mm grades to assist with ADLs, and IADLs,    Baseline Eval: Right shoulder flexion, abduction 3/5, elbow flexion, extension wrist extension 3+/5, 10th visit: improving with RUE strength by not yet met goal; 20th visit: R shd flex/abd 4-, elbow flex/ext 4+, wrist flex 4-, wrist ext 4+; 40:  R shd flex/abd 4,  elbow flex/ext 4+, wrist flex 4, wrist ext 4+; 50th visit 8/22: R shoulder flex/abd 4/5, elbow flex/ext 5/5, wrist flex 4, wrist ext 4+; 01/22/22: shoulder flex/abd 4/5, elbow flex/ext 5/5, wrist flex 4, wrist ext 4+; 03/01/22: same as 01/22/22   Time 12    Period Weeks    Status On-going    Target Date 07/02/22     OT LONG TERM GOAL #2   Title Pt. will improve right grip by 10 lbs to prepare for firmly holding objects for IADLs.    Baseline Eval: R: 38#, L: 124# (In 3rd dynamometer slot); R 35# in 4th slot, 28# in 3rd slot, 40th: 47 in 4th slot; 50th: 12/11/21: 46# on 3rd slot; 01/22/22: 30# on 3rd slot; 03/01/22: 44# 4th slot; 04/09/22: 46#   Time 12    Period Weeks    Status On-going    Target Date 07/02/22     OT LONG TERM GOAL #3   Title Pt. will improve right lateral pinch strength by 5# to assist with cutting food    Baseline Eval:  R: 17, L: 25; 20th: R: 18; 40th: 15# fingers slipping on meter, 50th: 9# fingers slipping; 01/22/22: 12# fingers slipping; 03/01/22: 16#; 04/09/22: 5# thumb slipping    Time 12    Period Weeks    Status On-going    Target Date 07/02/22     OT LONG TERM GOAL #4   Title Pt. will improve Right 3pt pinch by 2# to be able to hold/open items for cooking    Baseline Eval: R: Pt. unable to engage thumb L: 29#; 20th: R unable; 40th:  unable, fingers slipping; 50th:  unable, fingers slipping; 01/22/22: fingers slipping but pt is now able to grasp a ball peg with 3 point pinch pattern; 03/01/22: same as 10/3 in addition to being able to pick up ball peg from pegboard then place peg back into pegboard with a 3 point pinch pattern; 04/10/23: pt can pick up a ball peg from pegboard and place back into peg board using a 3 point pinch pattern, but when challenged to apply any force with this pinch, fingers slip from position. Unable to register a 3 point pinch on pinch gauge.   Time 12    Period Weeks    Status On-going    Target Date 07/02/22     OT LONG TERM GOAL #5   Title Pt. will improve right hand [facility] skills to be able to independently manipulate buttons, and zippers.    Baseline Eval: Pt. has difficulty managing buttons,a nd zippers. 10th visit:  improving; 20th: unable to engage R hand effectively, performs 1 handed with L; 50th: performs L handed only; 01/22/22: pt can use R hand as a stabilizer with clothing fasteners with mod vc; 03/01/22: same as 01/22/22; 04/09/22: Pt uses R hand as a stabilizer with mod vc   Time 12    Period Weeks  Status On-going    Target Date 07/02/22     OT LONG TERM GOAL #6   Title Pt. will independently write his name    Baseline Eval: Pt. is unable to hold a pen; 20th: pt can grip PenAgain with R hand and can make marks on paper with difficulty, but not yet writing name; 40th:  continues to demonstrate difficulty with grasp of pen; 50th 12/11/21: With assist to set up hand on  pen again, pt was able to write first name and initial of last name in 1 1/4" tall letters with fair legibility (pt wants to defer this goal, states this is low on his list of priorities for now)   Time 12    Period Weeks    Status Defer   Target Date 04/16/22      OT LONG TERM GOAL #7   Title Pt. will improve FOTO score by 2 points to reflect funational improvement    Baseline Eval: FOTO score 46 with TR score 52; 20th: 45; 40th: 44;  50th: 46; 01/22/22: 50; 03/01/22: 49; 04/09/22: 49   Time 12    Period Weeks    Status On-going    Target Date 07/02/22            Plan -    Clinical Impression Statement Pt seen for 80th visit progress update and recertification.  Planning to place pt on hold d/t limited functional gains over the last several updates.  Spasticity throughout the RUE limiting progression of FMC/dexterity skills.  OT communicated with pt's PA at PCP office with plan to get pt a referral to Dr. Naaman Plummer for assessing appropriateness for Botox injections for the RUE.  Encouraged pt also inquire whether he may be a good candidate for Vivistim.  Pt has been encouraged to continue with his current HEP, and will re-start OT following Botox injections as appropriate.   OT Occupational Profile and History Detailed Assessment- Review of Records and additional review of physical, cognitive, psychosocial history related to current functional performance    Occupational performance deficits (Please refer to evaluation for details): ADL's;IADL's;Education    Rehab Potential Good    Clinical Decision Making Several treatment options, min-mod task modification necessary    Comorbidities Affecting Occupational Performance: May have comorbidities impacting occupational performance    Modification or Assistance to Complete Evaluation  Min-Moderate modification of tasks or assist with assess necessary to complete eval    OT Frequency 2x / week    OT Duration 12 weeks    OT Treatment/Interventions  Self-care/ADL training;DME and/or AE instruction;Therapeutic exercise;Ultrasound;Neuromuscular education;Therapeutic activities;Energy conservation;Moist Heat;Patient/family education;Splinting;Functional Mobility Training;Paraffin    Plan Neuro re-ed and therapeutic exercise    Consulted and Agree with Plan of Care Patient             Leta Speller, MS, OTR/L   Darleene Cleaver, OT 04/09/2022, 4:52 PM

## 2022-04-11 ENCOUNTER — Ambulatory Visit: Payer: Medicare Other

## 2022-04-11 DIAGNOSIS — M545 Low back pain, unspecified: Secondary | ICD-10-CM | POA: Diagnosis not present

## 2022-04-11 DIAGNOSIS — R2681 Unsteadiness on feet: Secondary | ICD-10-CM

## 2022-04-11 DIAGNOSIS — M6281 Muscle weakness (generalized): Secondary | ICD-10-CM

## 2022-04-11 DIAGNOSIS — R262 Difficulty in walking, not elsewhere classified: Secondary | ICD-10-CM

## 2022-04-11 NOTE — Therapy (Signed)
[place] MAIN [facility] SERVICES [address] [place], [place], [address] Phone: [phone]   Fax:  [phone]  Patient Details  Name: David Reeves MRN: [id number] Date of Birth: 1970-05-29 Referring Provider:  [name], PA-C  Encounter Date: 04/11/2022   OUTPATIENT PHYSICAL THERAPY TREATMENT NOTE/Physical Therapy Progress Note   Dates of reporting period  03/05/2022   to   04/11/2022       Patient Name: David Reeves MRN: [id number] DOB:07-29-1970, 51 y.o., male Today's Date: 04/11/2022  PCP: [name], PA-C REFERRING PROVIDER: [name], PA-C   PT End of Session - 04/11/22 0938     Visit Number 72    Number of Visits 57    Date for PT Re-Evaluation 04/26/22    Authorization Type [employer]    Authorization Time Period 02/01/22-04/26/22    Progress Note Due on Visit 43    PT Start Time 0933    PT Stop Time 1015    PT Time Calculation (min) 42 min    Equipment Utilized During Treatment Gait belt    Activity Tolerance Patient tolerated treatment well;No increased pain    Behavior During Therapy [facility] for tasks assessed/performed                      Past Medical History:  Diagnosis Date   Back pain 04/22/2012   Bone spur    Bulging disc 04/22/2012   Degenerative disc disease    Osteoarthritis    Panic anxiety syndrome    Stroke ([place]) 10/2020   Taking multiple medications for chronic disease    Past Surgical History:  Procedure Laterality Date   CYSTECTOMY     head   HERNIA REPAIR Left 2006,2014   [facility]   TOE SURGERY Right 2007   Patient Active Problem List   Diagnosis Date Noted   Alterations of sensations following cerebrovascular accident 03/08/2021   Aphasia as late effect of cerebrovascular accident (CVA) 03/08/2021   Muscle spasm 02/22/2021   Acute non-recurrent frontal sinusitis 02/22/2021   Chronic pain syndrome 01/23/2021   Primary hypertension 01/23/2021    Nerve pain 01/23/2021   Erectile dysfunction due to diseases classified elsewhere 01/23/2021   Cognitive dysfunction 01/23/2021   Scrotal edema 01/23/2021   Elevated LDL cholesterol level 01/23/2021   History of stroke with residual deficit 01/23/2021   Primary insomnia 01/23/2021   Mouth pain 01/23/2021   Laceration of right hand without foreign body    Closed compression fracture of L1 vertebra ([place]) 05/24/2016   Lumbar stenosis with neurogenic claudication 11/01/2015   Chronic bilateral low back pain with right-sided sciatica 11/08/2014   Hx of hemorrhoids 11/08/2014   AAA (abdominal aortic aneurysm) ([place]) 09/22/2014   Chronic pain associated with significant psychosocial dysfunction 09/22/2014   Panic disorder 09/22/2014   AB (asthmatic bronchitis) 08/17/2014   Anxiety disorder due to general medical condition 08/17/2014   Backache 08/17/2014   Lumbosacral spondylosis without myelopathy 08/17/2014   Disorder of male genital organs 08/17/2014   Brash 08/17/2014   Low back pain 08/17/2014   Tendon nodule 08/17/2014   Episodic paroxysmal anxiety disorder 08/17/2014   Hernia, inguinal, right 08/17/2014   Fast heart beat 08/17/2014   Illness 08/17/2014   Inguinal hernia 10/14/2012    REFERRING DIAG: History of stroke with residual deficit, chronic bilateral low back pain with right-sided sciatica, Lumbosacral spondylosis without myelopathy THERAPY DIAG:  Muscle weakness (generalized)  Unsteadiness on feet  Difficulty in  walking, not elsewhere classified  Rationale for Evaluation and Treatment Rehabilitation  PERTINENT HISTORY: Pt. is a 51 y.o. who was diagnosed with a CVA on July 21st, 2022. Pt. completed several weeks of inpatient rehab at [facility]. After returning to home. He was discharged in late August/early September and recieved home health PT. Pt. sustained a fall in december of 2022, and was admitted to the hospital with COVID-19, and Chronic back pain. He reports  chronic back pain syndrome since 2012. Since the most recent discharge, Pt. has been residing with the ex-wife until the Pt. is ready to return to independent living. He did recieve 2 weeks of home health after discharge in December 2022. He is now being referred to outpatient PT to address weakness from stroke and improve fine motor movement. He reports rarely getting numbness/tingling in RLE. PMHx includes: Bilateral LBP with right sided sciatica, Lumbosacral spondylosis myelopathy, closed compression Fx of L1. Has chronic insomnia. osteoarthritis, Panic anxiety syndrome (taking medication) Pt. enjoys cooking, and riding motorcycles. Patient is going to [facility] spine center for Epidural spinal injections on 06/07/21;  PRECAUTIONS: Fall, Spinal Brace wears daily, approximately 25%, when exercising; semi-rigid lumbar brace  SUBJECTIVE: Pt reports pain level is currently a 1/10. Pt reports he is getting a simulator implanted to address his RUE tone soon and is currently holding OT until he has the procedure done.   PAIN:  Are you having pain? Yes: NPRS scale: 1/10 Pain location: bilat low back Pain description: dull ache; always constant,  Aggravating factors: worse with sitting,  Relieving factors: heat helps temporarily, pain pills/pain patch   TODAY'S TREATMENT 04/11/22  TherAct Goal retesting reviewed/completed, please refer to goal section below PT continues to recommend to pt to use handrails when descending steps due to reported unsteadiness  TherEx  On mat table- LTRs 10x each side Single leg bridge 15x each LE. Rates difficult RLE  Treadmill LE endurance training, performs up to 10% elevation, 1.5 mph, for total of 6 min 20 sec. Slightly fatiguing.  2# weights donned each LE: Standing hip abd 2x12 each LE Standing march 2x12 each LE   Single leg heel raises 12x each side   NMR: at balance bars, gait belt donned and CGA provided unless specified otherwise Airex static stand  head turns (vertical and horizontal) 10x for each Static stand, NBOS with reaching across midline each UE x multiple reps  EC with head turns (vertical and horizontal) 10x for each - PT provides up to min assist    Reports no increase in back pain with all interventions, pain remains at baseline  PATIENT EDUCATION:  Education details: Pt educated throughout session about proper posture and technique with exercises. Improved exercise technique, movement at target joints, use of target muscles after min to mod verbal, visual, tactile cues.   Person educated: Patient Education method: Explanation, Demonstration, Tactile cues, and Verbal cues Education comprehension: verbalized understanding, returned demonstration, verbal cues required, tactile cues required, and needs further education   HOME EXERCISE PROGRAM: No updates on this date pt to continue HEP as previously given     PT Short Term Goals -       PT SHORT TERM GOAL #1   Title Patient will be adherent to HEP at least 3x a week to improve functional strength and balance for better safety at home.    Baseline 4/4: doing them 1-2x a week; 08/20/2021=Patient reports performing his low back stretching and no questions currently HEP. 10/24/21: doing HEP 4x a  week   Time 4    Period Weeks    Status Achieved    Target Date 08/21/21      PT SHORT TERM GOAL #2   Title Patient (< 7 years old) will complete five times sit to stand test in < 15 seconds indicating an increased LE strength and improved balance.    Baseline 4/4: 17.85 sec with arms across chest; 08/20/2021= 17.6 sec , 7/5: 12.85 sec   Time 4    Period Weeks    Status Achieved;    Target Date 10/01/21              PT Long Term Goals -  TARGET DATE FOR GOALS 04/26/2022      PT LONG TERM GOAL #1   Title Patient will increase six minute walk test distance to >1000 for progression to community ambulator and improve gait ability    Baseline 4/4: 1115 feet , 7/5: 1080 feet;     Time 4    Period Weeks    Status Achieved    Target Date 08/21/21      PT LONG TERM GOAL #2   Title Patient will ascend/descend 8 stairs without rail assist independently without loss of balance to improve ability to get in/out of home.    Baseline 4/4: requires 1 rail assist; 08/20/2021- Patient demonstrated steps without railing using reciprocal steps today. 7/5: ascend/descend 6 steps reciprocally without rail, but required CGA for safety when descending; 7/25: Able to complete without UE support and good balance going up steps, unsafe descending steps without UE support. Required min assist and UE support to prevent LOB descending.; 8/15: requires UUE support to descend and close CGA due to; unsteadiness; 9/26: ascend without UE support, recip pattern; descend with UUE support, recip stepping, but still doesn't feel steady yet with descending; 10/13 able to demo recip pattern ascending hands free, required instance of UE support with descending, recip pattern due to heel getting caught under step; 11/14: deferred; 11/21: mod assist with descending due to unsteadiness, able to complete without UE support ascending; 12/21: Pt reports walking down about 24 stairs without BUE support, reports he did misstep partially but recovered his balance,    Time 12*corrected   Period Weeks    Status Partially met   Target Date      PT LONG TERM GOAL #3   Title Patient will increase BLE gross strength to 4+/5 as to improve functional strength for independent gait, increased standing tolerance and increased ADL ability.    Baseline 4/4: not formally assessed; 08/20/2021= 4+/5 with Right hip flex/knee ext/flex; Right hip abd= 4/5, 7/5: 4+/5 hip    Time 12    Period Weeks    Status Achieved;    Target Date 11/12/21      PT LONG TERM GOAL #4   Title Patient will improve FOTO score to >50% to indicate improved functional mobility with less pain with ADLs.    Baseline 4/4: 35%; 08/20/2021= 42% 7/5: 47%;  7:25:recently addressed; 11/30/21: 35; 9/26: 54%    Time 12    Period Weeks    Status MET   Target Date      PT LONG TERM GOAL #5   Title Patient will report a worst pain of 4/10 in low back over last week to indicate improved tolerance with ADLs.    Baseline 4/4: 7/10; 08/20/2021= 4/10 , 7/5: worst 7/10 in low back; 7/25: 6/10 ; 8/15: pt reports 6/10; 9/26: 4/10  Time 12    Period Weeks    Status Achieved    Target Date 02/05/2022      Additional Long Term Goals   Additional Long Term Goals Yes      PT LONG TERM GOAL #6   Title Pt will increase 10MWT by at least 0.13 m/s in order to demonstrate clinically significant improvement in community ambulation    Baseline 08/20/2021=0.94 m/s 7/5: 1.5 m/s   Time 12    Period Weeks    Status Achieved;    Target Date 11/12/21      PT LONG TERM GOAL #7   Title Patient will demonstrate improved static standing balance as seen by ability to single leg stance on right LE > 12 sec consistently for optimal balance on level and unlevel surfaces.    Baseline 08/20/2021= 4 sec SLS on right , 7/5: 4-5 sec inconsistent; 7/25 able to achieve 15 sec after multiple attempts of 4-5 sec holds; 8/15: LLE 15 sec, RLE 8 sec; 9/26: LLE 30 sec, RLE 17 seconds   Time 12    Period Weeks    Status MET   Target Date 02/05/2022    PT LONG TERM GOAL #8  Title Pt will report ability to ambulate for at least 15 minutes without an increase in his LBP in order to improve community participation and ease with ADLs  Baseline 9/26: increase in pain at 15 min mark; 10/13: reports about 20 minutes.   Time 3   Period Weeks   Status MET  Target Date 02/05/2022        PT LONG TERM GOAL #9  Title Pt will report ability to ambulate for at least 45 minutes without an increase in his LBP in order to improve community participation and ease with ADLs  Baseline 9/26: increase in pain at 15 min mark; 10/13: reports about 20 minutes. 11/14: deferred; 11/21: Unable to complete 45 min  gait without pain, but does report this has improved. Thinks he can do 30 min before pain increase.; 12/21: Pt unsure currently due to recent change in pain medication dose   Time 3   Period Weeks   Status ONGOING  Target Date        Plan -     Clinical Impression Statement Pt with notable improvement in upright activity/tolerance, endurance and strength AEB performing more difficult treadmill and standing therex interventions. Per goal testing pt still limited with balance descending steps, although this has also somewhat improved per report. Pt functional capacity still limited by back pain at this time. The pt will continue to benefit from skilled physical therapy intervention to address impairments, improve QOL, and attain therapy goals.     Personal Factors and Comorbidities Comorbidity 3+;Past/Current Experience;Time since onset of injury/illness/exacerbation    Comorbidities PMHx includes: Bilateral LBP with right sided sciatica, Lumbosacral spondylosis myelopathy, closed compression Fx of L1. Has chronic insomnia. osteoarthritis, Panic anxiety syndrome (taking medication)    Examination-Activity Limitations Bend;Carry;Locomotion Level;Sit;Squat;Stairs;Stand;Transfers    Examination-Participation Restrictions Cleaning;Community Activity;Driving;Laundry;Meal Prep;Occupation;Shop;Volunteer;Yard Work    Stability/Clinical Decision Making Stable/Uncomplicated    Rehab Potential Good    PT Frequency 2x / week    PT Duration 12 weeks    PT Treatment/Interventions Cryotherapy;Electrical Stimulation;Moist Heat;Traction;Gait training;Stair training;Functional mobility training;Therapeutic activities;Therapeutic exercise;Balance training;Neuromuscular re-education;Patient/family education;Manual techniques;Passive range of motion;Dry needling;Energy conservation    PT Next Visit Plan  Progress core stabilization, Manual therapy for low back and LE ROM, instruct in balance HEP, continue plan  Consulted and Agree with Plan of Care Patient              2:30 PM, 04/11/22 [facility]  Outpatient Physical Therapy- [place] MAIN [facility] SERVICES [address] [place], [place], [address] Phone: [phone]   Fax:  [phone]

## 2022-04-12 NOTE — Telephone Encounter (Signed)
Patient scheduled.

## 2022-04-16 ENCOUNTER — Ambulatory Visit: Payer: Medicare Other

## 2022-04-16 ENCOUNTER — Encounter: Payer: Self-pay | Admitting: Physical Medicine & Rehabilitation

## 2022-04-16 DIAGNOSIS — M6281 Muscle weakness (generalized): Secondary | ICD-10-CM

## 2022-04-16 DIAGNOSIS — M545 Low back pain, unspecified: Secondary | ICD-10-CM | POA: Diagnosis not present

## 2022-04-16 NOTE — Therapy (Signed)
[place] MAIN [facility] SERVICES [address] [place], [place], [address] Phone: [phone]   Fax:  [phone]  Patient Details  Name: David Reeves MRN: [id number] Date of Birth: 16-Jan-1971 Referring Provider:  [name], PA-C  Encounter Date: 04/16/2022   OUTPATIENT PHYSICAL THERAPY TREATMENT NOTE      Patient Name: David Reeves MRN: [id number] DOB:17-Apr-1971, 51 y.o., male Today's Date: 04/16/2022  PCP: [name], PA-C REFERRING PROVIDER: [name], PA-C   PT End of Session - 04/16/22 0909     Visit Number 15    Number of Visits 28    Date for PT Re-Evaluation 04/26/22    Authorization Type [employer]    Authorization Time Period 02/01/22-04/26/22    Progress Note Due on Visit 61    PT Start Time 0913    PT Stop Time 0954    PT Time Calculation (min) 41 min    Equipment Utilized During Treatment Gait belt    Activity Tolerance Patient tolerated treatment well;No increased pain    Behavior During Therapy [facility] for tasks assessed/performed                       Past Medical History:  Diagnosis Date   Back pain 04/22/2012   Bone spur    Bulging disc 04/22/2012   Degenerative disc disease    Osteoarthritis    Panic anxiety syndrome    Stroke ([place]) 10/2020   Taking multiple medications for chronic disease    Past Surgical History:  Procedure Laterality Date   CYSTECTOMY     head   HERNIA REPAIR Left 2006,2014   [facility]   TOE SURGERY Right 2007   Patient Active Problem List   Diagnosis Date Noted   Alterations of sensations following cerebrovascular accident 03/08/2021   Aphasia as late effect of cerebrovascular accident (CVA) 03/08/2021   Muscle spasm 02/22/2021   Acute non-recurrent frontal sinusitis 02/22/2021   Chronic pain syndrome 01/23/2021   Primary hypertension 01/23/2021   Nerve pain 01/23/2021   Erectile dysfunction due to diseases classified elsewhere  01/23/2021   Cognitive dysfunction 01/23/2021   Scrotal edema 01/23/2021   Elevated LDL cholesterol level 01/23/2021   History of stroke with residual deficit 01/23/2021   Primary insomnia 01/23/2021   Mouth pain 01/23/2021   Laceration of right hand without foreign body    Closed compression fracture of L1 vertebra ([place]) 05/24/2016   Lumbar stenosis with neurogenic claudication 11/01/2015   Chronic bilateral low back pain with right-sided sciatica 11/08/2014   Hx of hemorrhoids 11/08/2014   AAA (abdominal aortic aneurysm) ([place]) 09/22/2014   Chronic pain associated with significant psychosocial dysfunction 09/22/2014   Panic disorder 09/22/2014   AB (asthmatic bronchitis) 08/17/2014   Anxiety disorder due to general medical condition 08/17/2014   Backache 08/17/2014   Lumbosacral spondylosis without myelopathy 08/17/2014   Disorder of male genital organs 08/17/2014   Brash 08/17/2014   Low back pain 08/17/2014   Tendon nodule 08/17/2014   Episodic paroxysmal anxiety disorder 08/17/2014   Hernia, inguinal, right 08/17/2014   Fast heart beat 08/17/2014   Illness 08/17/2014   Inguinal hernia 10/14/2012    REFERRING DIAG: History of stroke with residual deficit, chronic bilateral low back pain with right-sided sciatica, Lumbosacral spondylosis without myelopathy THERAPY DIAG:  Muscle weakness (generalized)  Chronic bilateral low back pain without sciatica  Rationale for Evaluation and Treatment Rehabilitation  PERTINENT HISTORY: Pt. is a 51 y.o. who  was diagnosed with a CVA on July 21st, 2022. Pt. completed several weeks of inpatient rehab at [facility]. After returning to home. He was discharged in late August/early September and recieved home health PT. Pt. sustained a fall in december of 2022, and was admitted to the hospital with COVID-19, and Chronic back pain. He reports chronic back pain syndrome since 2012. Since the most recent discharge, Pt. has been residing with the ex-wife  until the Pt. is ready to return to independent living. He did recieve 2 weeks of home health after discharge in December 2022. He is now being referred to outpatient PT to address weakness from stroke and improve fine motor movement. He reports rarely getting numbness/tingling in RLE. PMHx includes: Bilateral LBP with right sided sciatica, Lumbosacral spondylosis myelopathy, closed compression Fx of L1. Has chronic insomnia. osteoarthritis, Panic anxiety syndrome (taking medication) Pt. enjoys cooking, and riding motorcycles. Patient is going to [facility] spine center for Epidural spinal injections on 06/07/21;  PRECAUTIONS: Fall, Spinal Brace wears daily, approximately 25%, when exercising; semi-rigid lumbar brace  SUBJECTIVE: Pt reports pain level is currently a 1/10 but reports it was as high as a 3/10 on several occasions. Pt reports no falls/stumbles and on other concerns.    PAIN:  Are you having pain? Yes: NPRS scale: 1/10 Pain location: bilat low back Pain description: dull ache; always constant,  Aggravating factors: worse with sitting,  Relieving factors: heat helps temporarily, pain pills/pain patch   TODAY'S TREATMENT 04/16/22   TherEx  On mat table- LTRs 10x each side Straight leg raise with 5# AW each LE - 20x each LE.  Side-lye hip abd 20x each LE with 5# AW donned   5# weights donned each LE: Standing hip abd 1x12 each LE  heel raises 12x B Standing march 2x12 each LE   Treadmill LE endurance training, performs up to 10% elevation, 2.0 mph, for total of 8 min. Continues to be slightly fatiguing.  Seated 7.5# AW donned B: LAQ 2x15 each LE   Reports no increase in back pain with all interventions, pain remains at baseline  PATIENT EDUCATION:  Education details: Pt educated throughout session about proper posture and technique with exercises. Improved exercise technique, movement at target joints, use of target muscles after min to mod verbal, visual, tactile cues.    Person educated: Patient Education method: Explanation, Demonstration, Tactile cues, and Verbal cues Education comprehension: verbalized understanding, returned demonstration, verbal cues required, tactile cues required, and needs further education   HOME EXERCISE PROGRAM: No updates on this date pt to continue HEP as previously given     PT Short Term Goals -       PT SHORT TERM GOAL #1   Title Patient will be adherent to HEP at least 3x a week to improve functional strength and balance for better safety at home.    Baseline 4/4: doing them 1-2x a week; 08/20/2021=Patient reports performing his low back stretching and no questions currently HEP. 10/24/21: doing HEP 4x a week   Time 4    Period Weeks    Status Achieved    Target Date 08/21/21      PT SHORT TERM GOAL #2   Title Patient (< 52 years old) will complete five times sit to stand test in < 15 seconds indicating an increased LE strength and improved balance.    Baseline 4/4: 17.85 sec with arms across chest; 08/20/2021= 17.6 sec , 7/5: 12.85 sec   Time 4  Period Weeks    Status Achieved;    Target Date 10/01/21              PT Long Term Goals -  TARGET DATE FOR GOALS 04/26/2022      PT LONG TERM GOAL #1   Title Patient will increase six minute walk test distance to >1000 for progression to community ambulator and improve gait ability    Baseline 4/4: 1115 feet , 7/5: 1080 feet;    Time 4    Period Weeks    Status Achieved    Target Date 08/21/21      PT LONG TERM GOAL #2   Title Patient will ascend/descend 8 stairs without rail assist independently without loss of balance to improve ability to get in/out of home.    Baseline 4/4: requires 1 rail assist; 08/20/2021- Patient demonstrated steps without railing using reciprocal steps today. 7/5: ascend/descend 6 steps reciprocally without rail, but required CGA for safety when descending; 7/25: Able to complete without UE support and good balance going up steps, unsafe  descending steps without UE support. Required min assist and UE support to prevent LOB descending.; 8/15: requires UUE support to descend and close CGA due to; unsteadiness; 9/26: ascend without UE support, recip pattern; descend with UUE support, recip stepping, but still doesn't feel steady yet with descending; 10/13 able to demo recip pattern ascending hands free, required instance of UE support with descending, recip pattern due to heel getting caught under step; 11/14: deferred; 11/21: mod assist with descending due to unsteadiness, able to complete without UE support ascending; 12/21: Pt reports walking down about 24 stairs without BUE support, reports he did misstep partially but recovered his balance,    Time 12*corrected   Period Weeks    Status Partially met   Target Date      PT LONG TERM GOAL #3   Title Patient will increase BLE gross strength to 4+/5 as to improve functional strength for independent gait, increased standing tolerance and increased ADL ability.    Baseline 4/4: not formally assessed; 08/20/2021= 4+/5 with Right hip flex/knee ext/flex; Right hip abd= 4/5, 7/5: 4+/5 hip    Time 12    Period Weeks    Status Achieved;    Target Date 11/12/21      PT LONG TERM GOAL #4   Title Patient will improve FOTO score to >50% to indicate improved functional mobility with less pain with ADLs.    Baseline 4/4: 35%; 08/20/2021= 42% 7/5: 47%; 7:25:recently addressed; 11/30/21: 35; 9/26: 54%    Time 12    Period Weeks    Status MET   Target Date      PT LONG TERM GOAL #5   Title Patient will report a worst pain of 4/10 in low back over last week to indicate improved tolerance with ADLs.    Baseline 4/4: 7/10; 08/20/2021= 4/10 , 7/5: worst 7/10 in low back; 7/25: 6/10 ; 8/15: pt reports 6/10; 9/26: 4/10   Time 12    Period Weeks    Status Achieved    Target Date 02/05/2022      Additional Long Term Goals   Additional Long Term Goals Yes      PT LONG TERM GOAL #6   Title Pt will  increase 10MWT by at least 0.13 m/s in order to demonstrate clinically significant improvement in community ambulation    Baseline 08/20/2021=0.94 m/s 7/5: 1.5 m/s   Time 12    Period Weeks  Status Achieved;    Target Date 11/12/21      PT LONG TERM GOAL #7   Title Patient will demonstrate improved static standing balance as seen by ability to single leg stance on right LE > 12 sec consistently for optimal balance on level and unlevel surfaces.    Baseline 08/20/2021= 4 sec SLS on right , 7/5: 4-5 sec inconsistent; 7/25 able to achieve 15 sec after multiple attempts of 4-5 sec holds; 8/15: LLE 15 sec, RLE 8 sec; 9/26: LLE 30 sec, RLE 17 seconds   Time 12    Period Weeks    Status MET   Target Date 02/05/2022    PT LONG TERM GOAL #8  Title Pt will report ability to ambulate for at least 15 minutes without an increase in his LBP in order to improve community participation and ease with ADLs  Baseline 9/26: increase in pain at 15 min mark; 10/13: reports about 20 minutes.   Time 3   Period Weeks   Status MET  Target Date 02/05/2022        PT LONG TERM GOAL #9  Title Pt will report ability to ambulate for at least 45 minutes without an increase in his LBP in order to improve community participation and ease with ADLs  Baseline 9/26: increase in pain at 15 min mark; 10/13: reports about 20 minutes. 11/14: deferred; 11/21: Unable to complete 45 min gait without pain, but does report this has improved. Thinks he can do 30 min before pain increase.; 12/21: Pt unsure currently due to recent change in pain medication dose   Time 3   Period Weeks   Status ONGOING  Target Date        Plan -     Clinical Impression Statement Pt able to further progress treadmill endurance training at incline without increase in low back pain, but still reports mild fatigue/LE weakness following intervention. PT instructed pt to track how long he is able to ambulate for outside of PT before LBP increase. Pt  verbalizes understanding, and reported that his LBP generally does increase to 3/10 frequently still outside of therapy. The pt will continue to benefit from skilled physical therapy intervention to address impairments, improve QOL, and attain therapy goals.     Personal Factors and Comorbidities Comorbidity 3+;Past/Current Experience;Time since onset of injury/illness/exacerbation    Comorbidities PMHx includes: Bilateral LBP with right sided sciatica, Lumbosacral spondylosis myelopathy, closed compression Fx of L1. Has chronic insomnia. osteoarthritis, Panic anxiety syndrome (taking medication)    Examination-Activity Limitations Bend;Carry;Locomotion Level;Sit;Squat;Stairs;Stand;Transfers    Examination-Participation Restrictions Cleaning;Community Activity;Driving;Laundry;Meal Prep;Occupation;Shop;Volunteer;Yard Work    Stability/Clinical Decision Making Stable/Uncomplicated    Rehab Potential Good    PT Frequency 2x / week    PT Duration 12 weeks    PT Treatment/Interventions Cryotherapy;Electrical Stimulation;Moist Heat;Traction;Gait training;Stair training;Functional mobility training;Therapeutic activities;Therapeutic exercise;Balance training;Neuromuscular re-education;Patient/family education;Manual techniques;Passive range of motion;Dry needling;Energy conservation    PT Next Visit Plan  Progress core stabilization, Manual therapy for low back and LE ROM, instruct in balance HEP, continue plan       Consulted and Agree with Plan of Care Patient              2:20 PM, 04/16/22 Zollie Pee PT    Physical Therapist - [facility]  Outpatient Physical Therapy- [place] [address] [place], [place], [address] Phone: [phone]  Fax:  [phone]

## 2022-04-18 ENCOUNTER — Ambulatory Visit: Payer: Medicare Other

## 2022-04-18 DIAGNOSIS — R2681 Unsteadiness on feet: Secondary | ICD-10-CM

## 2022-04-18 DIAGNOSIS — R262 Difficulty in walking, not elsewhere classified: Secondary | ICD-10-CM

## 2022-04-18 DIAGNOSIS — M545 Low back pain, unspecified: Secondary | ICD-10-CM | POA: Diagnosis not present

## 2022-04-18 DIAGNOSIS — M6281 Muscle weakness (generalized): Secondary | ICD-10-CM

## 2022-04-18 NOTE — Therapy (Signed)
[place] MAIN [facility] SERVICES [address] [place], [place], [address] Phone: [phone]   Fax:  [phone]  Patient Details  Name: David Reeves MRN: [id number] Date of Birth: 07/21/1970 Referring Provider:  [name], PA-C  Encounter Date: 04/18/2022   OUTPATIENT PHYSICAL THERAPY TREATMENT NOTE      Patient Name: David Reeves MRN: [id number] DOB:September 28, 1970, 51 y.o., male Today's Date: 04/18/2022  PCP: [name], PA-C REFERRING PROVIDER: [name], PA-C   PT End of Session - 04/18/22 0918     Visit Number 72    Number of Visits 58    Date for PT Re-Evaluation 04/26/22    Authorization Type [employer]    Authorization Time Period 02/01/22-04/26/22    Progress Note Due on Visit 91    PT Start Time 0914    PT Stop Time 0959    PT Time Calculation (min) 45 min    Equipment Utilized During Treatment Gait belt    Activity Tolerance Patient tolerated treatment well;No increased pain    Behavior During Therapy [facility]: Lambert Rhonda W for tasks assessed/performed                        Past Medical History:  Diagnosis Date   Back pain 04/22/2012   Bone spur    Bulging disc 04/22/2012   Degenerative disc disease    Osteoarthritis    Panic anxiety syndrome    Stroke ([place]) 10/2020   Taking multiple medications for chronic disease    Past Surgical History:  Procedure Laterality Date   CYSTECTOMY     head   HERNIA REPAIR Left 2006,2014   [facility]   TOE SURGERY Right 2007   Patient Active Problem List   Diagnosis Date Noted   Alterations of sensations following cerebrovascular accident 03/08/2021   Aphasia as late effect of cerebrovascular accident (CVA) 03/08/2021   Muscle spasm 02/22/2021   Acute non-recurrent frontal sinusitis 02/22/2021   Chronic pain syndrome 01/23/2021   Primary hypertension 01/23/2021   Nerve pain 01/23/2021   Erectile dysfunction due to diseases classified elsewhere  01/23/2021   Cognitive dysfunction 01/23/2021   Scrotal edema 01/23/2021   Elevated LDL cholesterol level 01/23/2021   History of stroke with residual deficit 01/23/2021   Primary insomnia 01/23/2021   Mouth pain 01/23/2021   Laceration of right hand without foreign body    Closed compression fracture of L1 vertebra ([place]) 05/24/2016   Lumbar stenosis with neurogenic claudication 11/01/2015   Chronic bilateral low back pain with right-sided sciatica 11/08/2014   Hx of hemorrhoids 11/08/2014   AAA (abdominal aortic aneurysm) ([place]) 09/22/2014   Chronic pain associated with significant psychosocial dysfunction 09/22/2014   Panic disorder 09/22/2014   AB (asthmatic bronchitis) 08/17/2014   Anxiety disorder due to general medical condition 08/17/2014   Backache 08/17/2014   Lumbosacral spondylosis without myelopathy 08/17/2014   Disorder of male genital organs 08/17/2014   Brash 08/17/2014   Low back pain 08/17/2014   Tendon nodule 08/17/2014   Episodic paroxysmal anxiety disorder 08/17/2014   Hernia, inguinal, right 08/17/2014   Fast heart beat 08/17/2014   Illness 08/17/2014   Inguinal hernia 10/14/2012    REFERRING DIAG: History of stroke with residual deficit, chronic bilateral low back pain with right-sided sciatica, Lumbosacral spondylosis without myelopathy THERAPY DIAG:  Unsteadiness on feet  Muscle weakness (generalized)  Difficulty in walking, not elsewhere classified  Rationale for Evaluation and Treatment Rehabilitation  PERTINENT HISTORY: Pt. is  a 51 y.o. who was diagnosed with a CVA on July 21st, 2022. Pt. completed several weeks of inpatient rehab at [facility]. After returning to home. He was discharged in late August/early September and recieved home health PT. Pt. sustained a fall in december of 2022, and was admitted to the hospital with COVID-19, and Chronic back pain. He reports chronic back pain syndrome since 2012. Since the most recent discharge, Pt. has been  residing with the ex-wife until the Pt. is ready to return to independent living. He did recieve 2 weeks of home health after discharge in December 2022. He is now being referred to outpatient PT to address weakness from stroke and improve fine motor movement. He reports rarely getting numbness/tingling in RLE. PMHx includes: Bilateral LBP with right sided sciatica, Lumbosacral spondylosis myelopathy, closed compression Fx of L1. Has chronic insomnia. osteoarthritis, Panic anxiety syndrome (taking medication) Pt. enjoys cooking, and riding motorcycles. Patient is going to [facility] for Epidural spinal injections on 06/07/21;  PRECAUTIONS: Fall, Spinal Brace wears daily, approximately 25%, when exercising; semi-rigid lumbar brace  SUBJECTIVE: Pt reports pain level is currently a 1/10 but reports it reached 3/10 when he was walking for a while shopping after his appointment.    PAIN:  Are you having pain? Yes: NPRS scale: 1/10 Pain location: bilat low back Pain description: dull ache; always constant,  Aggravating factors: worse with sitting,  Relieving factors: heat helps temporarily, pain pills/pain patch   TODAY'S TREATMENT 04/18/22   TherEx:  Heat donned to low back x 5 min (low back checked prior to use heat and is WN nothing currently applied to back, no adverse reaction to modality reported or observed, pt reports heat feels good)  On mat table- LTRs 10x each side Single leg glute bridge - 12x, 5x each LE  5# weights donned each LE: Standing hip abd 1x15 each LE  Standing hip ext 15x each LE Singe leg heel raises 10x each LE    Treadmill LE endurance training, performs up to 12% elevation, up to 1.8 mph, for total of 7 min 30 sec. Continues to be slightly fatiguing, reports no increased back pain, pt reports 12% elevation felt easier than 10%.   NMR:   Tandem stance 2x30 sec each LE without dual cog task then 30 sec with dual cog task SLB each LE 2x30 sec each LE  without dual cog task then 30 sec with dual cog task Step up/down slowy on and off 6" step fwd/bckwed and LTL x multiple reps of each with UUE support   Reports no increase in back pain with all interventions, pain remains at baseline  PATIENT EDUCATION:  Education details: Pt educated throughout session about proper posture and technique with exercises. Improved exercise technique, movement at target joints, use of target muscles after min to mod verbal, visual, tactile cues.   Person educated: Patient Education method: Explanation, Demonstration, Tactile cues, and Verbal cues Education comprehension: verbalized understanding, returned demonstration, verbal cues required, tactile cues required, and needs further education   HOME EXERCISE PROGRAM: No updates on this date pt to continue HEP as previously given     PT Short Term Goals -       PT SHORT TERM GOAL #1   Title Patient will be adherent to HEP at least 3x a week to improve functional strength and balance for better safety at home.    Baseline 4/4: doing them 1-2x a week; 08/20/2021=Patient reports performing his low back stretching and  no questions currently HEP. 10/24/21: doing HEP 4x a week   Time 4    Period Weeks    Status Achieved    Target Date 08/21/21      PT SHORT TERM GOAL #2   Title Patient (< 71 years old) will complete five times sit to stand test in < 15 seconds indicating an increased LE strength and improved balance.    Baseline 4/4: 17.85 sec with arms across chest; 08/20/2021= 17.6 sec , 7/5: 12.85 sec   Time 4    Period Weeks    Status Achieved;    Target Date 10/01/21              PT Long Term Goals -  TARGET DATE FOR GOALS 04/26/2022      PT LONG TERM GOAL #1   Title Patient will increase six minute walk test distance to >1000 for progression to community ambulator and improve gait ability    Baseline 4/4: 1115 feet , 7/5: 1080 feet;    Time 4    Period Weeks    Status Achieved    Target Date  08/21/21      PT LONG TERM GOAL #2   Title Patient will ascend/descend 8 stairs without rail assist independently without loss of balance to improve ability to get in/out of home.    Baseline 4/4: requires 1 rail assist; 08/20/2021- Patient demonstrated steps without railing using reciprocal steps today. 7/5: ascend/descend 6 steps reciprocally without rail, but required CGA for safety when descending; 7/25: Able to complete without UE support and good balance going up steps, unsafe descending steps without UE support. Required min assist and UE support to prevent LOB descending.; 8/15: requires UUE support to descend and close CGA due to; unsteadiness; 9/26: ascend without UE support, recip pattern; descend with UUE support, recip stepping, but still doesn't feel steady yet with descending; 10/13 able to demo recip pattern ascending hands free, required instance of UE support with descending, recip pattern due to heel getting caught under step; 11/14: deferred; 11/21: mod assist with descending due to unsteadiness, able to complete without UE support ascending; 12/21: Pt reports walking down about 24 stairs without BUE support, reports he did misstep partially but recovered his balance,    Time 12*corrected   Period Weeks    Status Partially met   Target Date      PT LONG TERM GOAL #3   Title Patient will increase BLE gross strength to 4+/5 as to improve functional strength for independent gait, increased standing tolerance and increased ADL ability.    Baseline 4/4: not formally assessed; 08/20/2021= 4+/5 with Right hip flex/knee ext/flex; Right hip abd= 4/5, 7/5: 4+/5 hip    Time 12    Period Weeks    Status Achieved;    Target Date 11/12/21      PT LONG TERM GOAL #4   Title Patient will improve FOTO score to >50% to indicate improved functional mobility with less pain with ADLs.    Baseline 4/4: 35%; 08/20/2021= 42% 7/5: 47%; 7:25:recently addressed; 11/30/21: 35; 9/26: 54%    Time 12    Period  Weeks    Status MET   Target Date      PT LONG TERM GOAL #5   Title Patient will report a worst pain of 4/10 in low back over last week to indicate improved tolerance with ADLs.    Baseline 4/4: 7/10; 08/20/2021= 4/10 , 7/5: worst 7/10 in low back; 7/25: 6/10 ;  8/15: pt reports 6/10; 9/26: 4/10   Time 12    Period Weeks    Status Achieved    Target Date 02/05/2022      Additional Long Term Goals   Additional Long Term Goals Yes      PT LONG TERM GOAL #6   Title Pt will increase 10MWT by at least 0.13 m/s in order to demonstrate clinically significant improvement in community ambulation    Baseline 08/20/2021=0.94 m/s 7/5: 1.5 m/s   Time 12    Period Weeks    Status Achieved;    Target Date 11/12/21      PT LONG TERM GOAL #7   Title Patient will demonstrate improved static standing balance as seen by ability to single leg stance on right LE > 12 sec consistently for optimal balance on level and unlevel surfaces.    Baseline 08/20/2021= 4 sec SLS on right , 7/5: 4-5 sec inconsistent; 7/25 able to achieve 15 sec after multiple attempts of 4-5 sec holds; 8/15: LLE 15 sec, RLE 8 sec; 9/26: LLE 30 sec, RLE 17 seconds   Time 12    Period Weeks    Status MET   Target Date 02/05/2022    PT LONG TERM GOAL #8  Title Pt will report ability to ambulate for at least 15 minutes without an increase in his LBP in order to improve community participation and ease with ADLs  Baseline 9/26: increase in pain at 15 min mark; 10/13: reports about 20 minutes.   Time 3   Period Weeks   Status MET  Target Date 02/05/2022        PT LONG TERM GOAL #9  Title Pt will report ability to ambulate for at least 45 minutes without an increase in his LBP in order to improve community participation and ease with ADLs  Baseline 9/26: increase in pain at 15 min mark; 10/13: reports about 20 minutes. 11/14: deferred; 11/21: Unable to complete 45 min gait without pain, but does report this has improved. Thinks he can do  30 min before pain increase.; 12/21: Pt unsure currently due to recent change in pain medication dose   Time 3   Period Weeks   Status ONGOING  Target Date        Plan -     Clinical Impression Statement Pt further progresses treadmill endurance training today, reporting 12% elevation feels easier than 10%. He also exhibits improved consistency with heel-strike on RLE and quad control in mid-stance. Pt challenged by all balance activities requiring dual cog task and with eccentric control for step-downs. The pt will continue to benefit from skilled physical therapy intervention to address impairments, improve QOL, and attain therapy goals.     Personal Factors and Comorbidities Comorbidity 3+;Past/Current Experience;Time since onset of injury/illness/exacerbation    Comorbidities PMHx includes: Bilateral LBP with right sided sciatica, Lumbosacral spondylosis myelopathy, closed compression Fx of L1. Has chronic insomnia. osteoarthritis, Panic anxiety syndrome (taking medication)    Examination-Activity Limitations Bend;Carry;Locomotion Level;Sit;Squat;Stairs;Stand;Transfers    Examination-Participation Restrictions Cleaning;Community Activity;Driving;Laundry;Meal Prep;Occupation;Shop;Volunteer;Yard Work    Stability/Clinical Decision Making Stable/Uncomplicated    Rehab Potential Good    PT Frequency 2x / week    PT Duration 12 weeks    PT Treatment/Interventions Cryotherapy;Electrical Stimulation;Moist Heat;Traction;Gait training;Stair training;Functional mobility training;Therapeutic activities;Therapeutic exercise;Balance training;Neuromuscular re-education;Patient/family education;Manual techniques;Passive range of motion;Dry needling;Energy conservation    PT Next Visit Plan  Progress core stabilization, Manual therapy for low back and LE ROM, instruct in balance HEP, continue  plan       Consulted and Agree with Plan of Care Patient              11:10 AM, 04/18/22 [facility]  Outpatient Physical Therapy- [place] MAIN [place] [address] [place], [place], [address] Phone: [phone]   Fax:  [phone]

## 2022-04-23 ENCOUNTER — Ambulatory Visit: Payer: 59

## 2022-04-23 ENCOUNTER — Ambulatory Visit: Payer: 59 | Attending: Physician Assistant

## 2022-04-23 DIAGNOSIS — M545 Low back pain, unspecified: Secondary | ICD-10-CM | POA: Diagnosis not present

## 2022-04-23 DIAGNOSIS — R2681 Unsteadiness on feet: Secondary | ICD-10-CM | POA: Diagnosis not present

## 2022-04-23 DIAGNOSIS — R278 Other lack of coordination: Secondary | ICD-10-CM | POA: Diagnosis not present

## 2022-04-23 DIAGNOSIS — R262 Difficulty in walking, not elsewhere classified: Secondary | ICD-10-CM | POA: Diagnosis not present

## 2022-04-23 DIAGNOSIS — M6281 Muscle weakness (generalized): Secondary | ICD-10-CM | POA: Diagnosis not present

## 2022-04-23 DIAGNOSIS — G8929 Other chronic pain: Secondary | ICD-10-CM | POA: Insufficient documentation

## 2022-04-23 NOTE — Therapy (Signed)
[place] [facility] SERVICES [address] [place], [place], [address] Phone: [phone]   Fax:  [phone]  Patient Details  Name: David Reeves MRN: [id number] Date of Birth: 1971/03/21 Referring Provider:  [name], PA-C  Encounter Date: 04/23/2022   OUTPATIENT PHYSICAL THERAPY TREATMENT NOTE      Patient Name: David Reeves MRN: [id number] DOB:Mar 01, 1971, 52 y.o., male Today's Date: 04/23/2022  PCP: [name], PA-C REFERRING PROVIDER: [name], PA-C   PT End of Session - 04/23/22 0936     Visit Number 65    Number of Visits 61    Date for PT Re-Evaluation 04/26/22    Authorization Type [employer]    Authorization Time Period 02/01/22-04/26/22    Progress Note Due on Visit 74    PT Start Time 0931    PT Stop Time 1013    PT Time Calculation (min) 42 min    Equipment Utilized During Treatment Gait belt    Activity Tolerance Patient tolerated treatment well;No increased pain    Behavior During Therapy [facility] for tasks assessed/performed                Past Medical History:  Diagnosis Date   Back pain 04/22/2012   Bone spur    Bulging disc 04/22/2012   Degenerative disc disease    Osteoarthritis    Panic anxiety syndrome    Stroke ([place]) 10/2020   Taking multiple medications for chronic disease    Past Surgical History:  Procedure Laterality Date   CYSTECTOMY     head   HERNIA REPAIR Left 2006,2014   [facility]   TOE SURGERY Right 2007   Patient Active Problem List   Diagnosis Date Noted   Alterations of sensations following cerebrovascular accident 03/08/2021   Aphasia as late effect of cerebrovascular accident (CVA) 03/08/2021   Muscle spasm 02/22/2021   Acute non-recurrent frontal sinusitis 02/22/2021   Chronic pain syndrome 01/23/2021   Primary hypertension 01/23/2021   Nerve pain 01/23/2021   Erectile dysfunction due to diseases classified elsewhere 01/23/2021   Cognitive  dysfunction 01/23/2021   Scrotal edema 01/23/2021   Elevated LDL cholesterol level 01/23/2021   History of stroke with residual deficit 01/23/2021   Primary insomnia 01/23/2021   Mouth pain 01/23/2021   Laceration of right hand without foreign body    Closed compression fracture of L1 vertebra ([place]) 05/24/2016   Lumbar stenosis with neurogenic claudication 11/01/2015   Chronic bilateral low back pain with right-sided sciatica 11/08/2014   Hx of hemorrhoids 11/08/2014   AAA (abdominal aortic aneurysm) ([place]) 09/22/2014   Chronic pain associated with significant psychosocial dysfunction 09/22/2014   Panic disorder 09/22/2014   AB (asthmatic bronchitis) 08/17/2014   Anxiety disorder due to general medical condition 08/17/2014   Backache 08/17/2014   Lumbosacral spondylosis without myelopathy 08/17/2014   Disorder of male genital organs 08/17/2014   Brash 08/17/2014   Low back pain 08/17/2014   Tendon nodule 08/17/2014   Episodic paroxysmal anxiety disorder 08/17/2014   Hernia, inguinal, right 08/17/2014   Fast heart beat 08/17/2014   Illness 08/17/2014   Inguinal hernia 10/14/2012    REFERRING DIAG: History of stroke with residual deficit, chronic bilateral low back pain with right-sided sciatica, Lumbosacral spondylosis without myelopathy THERAPY DIAG:  Chronic bilateral low back pain without sciatica  Muscle weakness (generalized)  Unsteadiness on feet  Rationale for Evaluation and Treatment Rehabilitation  PERTINENT HISTORY: Pt. is a 52 y.o. who was diagnosed with  a CVA on July 21st, 2022. Pt. completed several weeks of inpatient rehab at [facility]. After returning to home. He was discharged in late August/early September and recieved home health PT. Pt. sustained a fall in december of 2022, and was admitted to the hospital with COVID-19, and Chronic back pain. He reports chronic back pain syndrome since 2012. Since the most recent discharge, Pt. has been residing with the ex-wife  until the Pt. is ready to return to independent living. He did recieve 2 weeks of home health after discharge in December 2022. He is now being referred to outpatient PT to address weakness from stroke and improve fine motor movement. He reports rarely getting numbness/tingling in RLE. PMHx includes: Bilateral LBP with right sided sciatica, Lumbosacral spondylosis myelopathy, closed compression Fx of L1. Has chronic insomnia. osteoarthritis, Panic anxiety syndrome (taking medication) Pt. enjoys cooking, and riding motorcycles. Patient is going to [facility] spine center for Epidural spinal injections on 06/07/21;  PRECAUTIONS: Fall, Spinal Brace wears daily, approximately 25%, when exercising; semi-rigid lumbar brace  SUBJECTIVE: Pt reports pain is 1/10 but he is having some trouble today. He had a good holiday. Would prefer to start with addressing his back pain.   PAIN:  Are you having pain? Yes: NPRS scale: 1/10 Pain location: bilat low back Pain description: dull ache; always constant,  Aggravating factors: worse with sitting,  Relieving factors: heat helps temporarily, pain pills/pain patch   TODAY'S TREATMENT 04/23/22   TherEx:  Heat donned to low back (low back checked prior to use of heat and is WNL, nothing currently applied to back, no adverse reaction to modality reported or observed, pt reports heat feels good)  On mat table- LTRs 10x each side 90:90 core/deadbug progression, with TrA activation and pelvic tilt 10x 10 sec hold/rep. Rates medium  Hamstring curl with glute bridge on stability ball 1x10  Single leg hamstring curl 5x each LE with glute bridge on p.ball, very challenging RLE  Leg press: 6-10 reps at each weight 70#, 85#, 100#, 115#, 130#, 145 #    NMR:  At support surface- Tandem stance with vertical and horizontal head turns each LE x multiples reps of each in each LE position  SLB each LE 4x30 sec each  Sustained heel raise 30 sec. Difficult,  fatiguing   Reports no increase in back pain with all interventions, pain remains at baseline  PATIENT EDUCATION:  Education details: Pt educated throughout session about proper posture and technique with exercises. Improved exercise technique, movement at target joints, use of target muscles after min to mod verbal, visual, tactile cues.   Person educated: Patient Education method: Explanation, Demonstration, Tactile cues, and Verbal cues Education comprehension: verbalized understanding, returned demonstration, verbal cues required, tactile cues required, and needs further education   HOME EXERCISE PROGRAM: No updates on this date pt to continue HEP as previously given     PT Short Term Goals -       PT SHORT TERM GOAL #1   Title Patient will be adherent to HEP at least 3x a week to improve functional strength and balance for better safety at home.    Baseline 4/4: doing them 1-2x a week; 08/20/2021=Patient reports performing his low back stretching and no questions currently HEP. 10/24/21: doing HEP 4x a week   Time 4    Period Weeks    Status Achieved    Target Date 08/21/21      PT SHORT TERM GOAL #2   Title Patient (<  61 years old) will complete five times sit to stand test in < 15 seconds indicating an increased LE strength and improved balance.    Baseline 4/4: 17.85 sec with arms across chest; 08/20/2021= 17.6 sec , 7/5: 12.85 sec   Time 4    Period Weeks    Status Achieved;    Target Date 10/01/21              PT Long Term Goals -  TARGET DATE FOR GOALS 04/26/2022      PT LONG TERM GOAL #1   Title Patient will increase six minute walk test distance to >1000 for progression to community ambulator and improve gait ability    Baseline 4/4: 1115 feet , 7/5: 1080 feet;    Time 4    Period Weeks    Status Achieved    Target Date 08/21/21      PT LONG TERM GOAL #2   Title Patient will ascend/descend 8 stairs without rail assist independently without loss of balance to  improve ability to get in/out of home.    Baseline 4/4: requires 1 rail assist; 08/20/2021- Patient demonstrated steps without railing using reciprocal steps today. 7/5: ascend/descend 6 steps reciprocally without rail, but required CGA for safety when descending; 7/25: Able to complete without UE support and good balance going up steps, unsafe descending steps without UE support. Required min assist and UE support to prevent LOB descending.; 8/15: requires UUE support to descend and close CGA due to; unsteadiness; 9/26: ascend without UE support, recip pattern; descend with UUE support, recip stepping, but still doesn't feel steady yet with descending; 10/13 able to demo recip pattern ascending hands free, required instance of UE support with descending, recip pattern due to heel getting caught under step; 11/14: deferred; 11/21: mod assist with descending due to unsteadiness, able to complete without UE support ascending; 12/21: Pt reports walking down about 24 stairs without BUE support, reports he did misstep partially but recovered his balance,    Time 12*corrected   Period Weeks    Status Partially met   Target Date      PT LONG TERM GOAL #3   Title Patient will increase BLE gross strength to 4+/5 as to improve functional strength for independent gait, increased standing tolerance and increased ADL ability.    Baseline 4/4: not formally assessed; 08/20/2021= 4+/5 with Right hip flex/knee ext/flex; Right hip abd= 4/5, 7/5: 4+/5 hip    Time 12    Period Weeks    Status Achieved;    Target Date 11/12/21      PT LONG TERM GOAL #4   Title Patient will improve FOTO score to >50% to indicate improved functional mobility with less pain with ADLs.    Baseline 4/4: 35%; 08/20/2021= 42% 7/5: 47%; 7:25:recently addressed; 11/30/21: 35; 9/26: 54%    Time 12    Period Weeks    Status MET   Target Date      PT LONG TERM GOAL #5   Title Patient will report a worst pain of 4/10 in low back over last week to  indicate improved tolerance with ADLs.    Baseline 4/4: 7/10; 08/20/2021= 4/10 , 7/5: worst 7/10 in low back; 7/25: 6/10 ; 8/15: pt reports 6/10; 9/26: 4/10   Time 12    Period Weeks    Status Achieved    Target Date 02/05/2022      Additional Long Term Goals   Additional Long Term Goals Yes  PT LONG TERM GOAL #6   Title Pt will increase 10MWT by at least 0.13 m/s in order to demonstrate clinically significant improvement in community ambulation    Baseline 08/20/2021=0.94 m/s 7/5: 1.5 m/s   Time 12    Period Weeks    Status Achieved;    Target Date 11/12/21      PT LONG TERM GOAL #7   Title Patient will demonstrate improved static standing balance as seen by ability to single leg stance on right LE > 12 sec consistently for optimal balance on level and unlevel surfaces.    Baseline 08/20/2021= 4 sec SLS on right , 7/5: 4-5 sec inconsistent; 7/25 able to achieve 15 sec after multiple attempts of 4-5 sec holds; 8/15: LLE 15 sec, RLE 8 sec; 9/26: LLE 30 sec, RLE 17 seconds   Time 12    Period Weeks    Status MET   Target Date 02/05/2022    PT LONG TERM GOAL #8  Title Pt will report ability to ambulate for at least 15 minutes without an increase in his LBP in order to improve community participation and ease with ADLs  Baseline 9/26: increase in pain at 15 min mark; 10/13: reports about 20 minutes.   Time 3   Period Weeks   Status MET  Target Date 02/05/2022        PT LONG TERM GOAL #9  Title Pt will report ability to ambulate for at least 45 minutes without an increase in his LBP in order to improve community participation and ease with ADLs  Baseline 9/26: increase in pain at 15 min mark; 10/13: reports about 20 minutes. 11/14: deferred; 11/21: Unable to complete 45 min gait without pain, but does report this has improved. Thinks he can do 30 min before pain increase.; 12/21: Pt unsure currently due to recent change in pain medication dose   Time 3   Period Weeks   Status  ONGOING  Target Date        Plan -     Clinical Impression Statement Pt continues to make good progress with challenging therex without increase in back pain. He does continue to have some trouble with coordination of RLE with exercises. Pt also with difficulty performing sustained heel raise and RLE SLB. The pt will continue to benefit from skilled physical therapy intervention to address impairments, improve QOL, and attain therapy goals.     Personal Factors and Comorbidities Comorbidity 3+;Past/Current Experience;Time since onset of injury/illness/exacerbation    Comorbidities PMHx includes: Bilateral LBP with right sided sciatica, Lumbosacral spondylosis myelopathy, closed compression Fx of L1. Has chronic insomnia. osteoarthritis, Panic anxiety syndrome (taking medication)    Examination-Activity Limitations Bend;Carry;Locomotion Level;Sit;Squat;Stairs;Stand;Transfers    Examination-Participation Restrictions Cleaning;Community Activity;Driving;Laundry;Meal Prep;Occupation;Shop;Volunteer;Yard Work    Stability/Clinical Decision Making Stable/Uncomplicated    Rehab Potential Good    PT Frequency 2x / week    PT Duration 12 weeks    PT Treatment/Interventions Cryotherapy;Electrical Stimulation;Moist Heat;Traction;Gait training;Stair training;Functional mobility training;Therapeutic activities;Therapeutic exercise;Balance training;Neuromuscular re-education;Patient/family education;Manual techniques;Passive range of motion;Dry needling;Energy conservation    PT Next Visit Plan  Progress core stabilization, Manual therapy for low back and LE ROM, instruct in balance HEP, continue plan       Consulted and Agree with Plan of Care Patient              10:54 AM, 04/23/22 Zollie Pee PT    Physical Therapist - [place]  [place] [facility]  SERVICES [place], [place], [address] Phone: [phone]   Fax:  [phone]

## 2022-04-25 ENCOUNTER — Ambulatory Visit: Payer: 59

## 2022-04-25 DIAGNOSIS — G8929 Other chronic pain: Secondary | ICD-10-CM | POA: Diagnosis not present

## 2022-04-25 DIAGNOSIS — M6281 Muscle weakness (generalized): Secondary | ICD-10-CM | POA: Diagnosis not present

## 2022-04-25 DIAGNOSIS — R262 Difficulty in walking, not elsewhere classified: Secondary | ICD-10-CM | POA: Diagnosis not present

## 2022-04-25 DIAGNOSIS — R278 Other lack of coordination: Secondary | ICD-10-CM | POA: Diagnosis not present

## 2022-04-25 DIAGNOSIS — R2681 Unsteadiness on feet: Secondary | ICD-10-CM

## 2022-04-25 DIAGNOSIS — M545 Low back pain, unspecified: Secondary | ICD-10-CM | POA: Diagnosis not present

## 2022-04-25 NOTE — Therapy (Signed)
[place] MAIN [facility] SERVICES [address] [place], [place], [address] Phone: [phone]   Fax:  [phone]  Patient Details  Name: David Reeves MRN: [id number] Date of Birth: July 31, 1970 Referring Provider:  [name], PA-C  Encounter Date: 04/25/2022   OUTPATIENT PHYSICAL THERAPY TREATMENT NOTE      Patient Name: David Reeves MRN: [id number] DOB:[birth year], 52 y.o., male Today's Date: 04/25/2022  PCP: [name], PA-C REFERRING PROVIDER: [name], PA-C   PT End of Session - 04/25/22 0853     Visit Number 34    Number of Visits 27    Date for PT Re-Evaluation 04/26/22    Authorization Type [employer]    Authorization Time Period 02/01/22-04/26/22    Progress Note Due on Visit 31    PT Start Time 0846    PT Stop Time 0928    PT Time Calculation (min) 42 min    Equipment Utilized During Treatment Gait belt    Activity Tolerance Patient tolerated treatment well;No increased pain    Behavior During Therapy [facility] for tasks assessed/performed                 Past Medical History:  Diagnosis Date   Back pain 04/22/2012   Bone spur    Bulging disc 04/22/2012   Degenerative disc disease    Osteoarthritis    Panic anxiety syndrome    Stroke ([place]) 10/2020   Taking multiple medications for chronic disease    Past Surgical History:  Procedure Laterality Date   CYSTECTOMY     head   HERNIA REPAIR Left 2006,2014   [facility]   TOE SURGERY Right 2007   Patient Active Problem List   Diagnosis Date Noted   Alterations of sensations following cerebrovascular accident 03/08/2021   Aphasia as late effect of cerebrovascular accident (CVA) 03/08/2021   Muscle spasm 02/22/2021   Acute non-recurrent frontal sinusitis 02/22/2021   Chronic pain syndrome 01/23/2021   Primary hypertension 01/23/2021   Nerve pain 01/23/2021   Erectile dysfunction due to diseases classified elsewhere 01/23/2021    Cognitive dysfunction 01/23/2021   Scrotal edema 01/23/2021   Elevated LDL cholesterol level 01/23/2021   History of stroke with residual deficit 01/23/2021   Primary insomnia 01/23/2021   Mouth pain 01/23/2021   Laceration of right hand without foreign body    Closed compression fracture of L1 vertebra ([place]) 05/24/2016   Lumbar stenosis with neurogenic claudication 11/01/2015   Chronic bilateral low back pain with right-sided sciatica 11/08/2014   Hx of hemorrhoids 11/08/2014   AAA (abdominal aortic aneurysm) ([place]) 09/22/2014   Chronic pain associated with significant psychosocial dysfunction 09/22/2014   Panic disorder 09/22/2014   AB (asthmatic bronchitis) 08/17/2014   Anxiety disorder due to general medical condition 08/17/2014   Backache 08/17/2014   Lumbosacral spondylosis without myelopathy 08/17/2014   Disorder of male genital organs 08/17/2014   Brash 08/17/2014   Low back pain 08/17/2014   Tendon nodule 08/17/2014   Episodic paroxysmal anxiety disorder 08/17/2014   Hernia, inguinal, right 08/17/2014   Fast heart beat 08/17/2014   Illness 08/17/2014   Inguinal hernia 10/14/2012    REFERRING DIAG: History of stroke with residual deficit, chronic bilateral low back pain with right-sided sciatica, Lumbosacral spondylosis without myelopathy THERAPY DIAG:  Chronic bilateral low back pain without sciatica  Muscle weakness (generalized)  Unsteadiness on feet  Rationale for Evaluation and Treatment Rehabilitation  PERTINENT HISTORY: Pt. is a 52 y.o. who was diagnosed  with a CVA on July 21st, 2022. Pt. completed several weeks of inpatient rehab at [facility]. After returning to home. He was discharged in late August/early September and recieved home health PT. Pt. sustained a fall in december of 2022, and was admitted to the hospital with COVID-19, and Chronic back pain. He reports chronic back pain syndrome since 2012. Since the most recent discharge, Pt. has been residing with the  ex-wife until the Pt. is ready to return to independent living. He did recieve 2 weeks of home health after discharge in December 2022. He is now being referred to outpatient PT to address weakness from stroke and improve fine motor movement. He reports rarely getting numbness/tingling in RLE. PMHx includes: Bilateral LBP with right sided sciatica, Lumbosacral spondylosis myelopathy, closed compression Fx of L1. Has chronic insomnia. osteoarthritis, Panic anxiety syndrome (taking medication) Pt. enjoys cooking, and riding motorcycles. Patient is going to [facility] spine center for Epidural spinal injections on 06/07/21;  PRECAUTIONS: Fall, Spinal Brace wears daily, approximately 25%, when exercising; semi-rigid lumbar brace  SUBJECTIVE: Pt reports he slept poorly. Pt reports back pain is currently a 1/10. He almost cancelled PT due to feeling bad from getting only 2 hours of sleep.   PAIN:  Are you having pain? Yes: NPRS scale: 1/10 Pain location: bilat low back Pain description: dull ache; always constant,  Aggravating factors: worse with sitting,  Relieving factors: heat helps temporarily, pain pills/pain patch   TODAY'S TREATMENT 04/25/22   TherEx:  Heat donned to low back (low back checked prior to use of heat and is WNL, nothing currently applied to back, no adverse reaction to modality reported or observed, skin WNL following removal of heat, pt reports heat feels good)  On mat table- LTRs 10x each side SLR 10x each LE 90:90 core/deadbug progression, with TrA activation and pelvic tilt 10x 10 sec hold/rep. Rates easy-medium  Dead bug with one leg 90:90 position and other leg in full knee ext tapping and raising from table 10x each LE  LTRx 10x each side Hamstring curl with glute bridge on stability ball 1x15. Pt rates medium   Single leg hamstring curl 8x each LE with glute bridge on p.ball, continues to be very challenging for RLE Side-lying hip abduction  with 5# weights 20x each LE.  Improved RLE coordination compared to prior appointments.   NMR:  At support surface- Tandem stance 2x30 sec each LE SLB each LE 3x30 sec each  Reports no increase in back pain with all interventions, pain remains at baseline  PATIENT EDUCATION:  Education details: Pt educated throughout session about proper posture and technique with exercises. Improved exercise technique, movement at target joints, use of target muscles after min to mod verbal, visual, tactile cues.   Person educated: Patient Education method: Explanation, Demonstration, Tactile cues, and Verbal cues Education comprehension: verbalized understanding, returned demonstration, verbal cues required, tactile cues required, and needs further education   HOME EXERCISE PROGRAM: No updates on this date pt to continue HEP as previously given     PT Short Term Goals -       PT SHORT TERM GOAL #1   Title Patient will be adherent to HEP at least 3x a week to improve functional strength and balance for better safety at home.    Baseline 4/4: doing them 1-2x a week; 08/20/2021=Patient reports performing his low back stretching and no questions currently HEP. 10/24/21: doing HEP 4x a week   Time 4    Period Weeks  Status Achieved    Target Date 08/21/21      PT SHORT TERM GOAL #2   Title Patient (< 33 years old) will complete five times sit to stand test in < 15 seconds indicating an increased LE strength and improved balance.    Baseline 4/4: 17.85 sec with arms across chest; 08/20/2021= 17.6 sec , 7/5: 12.85 sec   Time 4    Period Weeks    Status Achieved;    Target Date 10/01/21              PT Long Term Goals -  TARGET DATE FOR GOALS 04/26/2022      PT LONG TERM GOAL #1   Title Patient will increase six minute walk test distance to >1000 for progression to community ambulator and improve gait ability    Baseline 4/4: 1115 feet , 7/5: 1080 feet;    Time 4    Period Weeks    Status Achieved    Target Date  08/21/21      PT LONG TERM GOAL #2   Title Patient will ascend/descend 8 stairs without rail assist independently without loss of balance to improve ability to get in/out of home.    Baseline 4/4: requires 1 rail assist; 08/20/2021- Patient demonstrated steps without railing using reciprocal steps today. 7/5: ascend/descend 6 steps reciprocally without rail, but required CGA for safety when descending; 7/25: Able to complete without UE support and good balance going up steps, unsafe descending steps without UE support. Required min assist and UE support to prevent LOB descending.; 8/15: requires UUE support to descend and close CGA due to; unsteadiness; 9/26: ascend without UE support, recip pattern; descend with UUE support, recip stepping, but still doesn't feel steady yet with descending; 10/13 able to demo recip pattern ascending hands free, required instance of UE support with descending, recip pattern due to heel getting caught under step; 11/14: deferred; 11/21: mod assist with descending due to unsteadiness, able to complete without UE support ascending; 12/21: Pt reports walking down about 24 stairs without BUE support, reports he did misstep partially but recovered his balance,    Time 12*corrected   Period Weeks    Status Partially met   Target Date      PT LONG TERM GOAL #3   Title Patient will increase BLE gross strength to 4+/5 as to improve functional strength for independent gait, increased standing tolerance and increased ADL ability.    Baseline 4/4: not formally assessed; 08/20/2021= 4+/5 with Right hip flex/knee ext/flex; Right hip abd= 4/5, 7/5: 4+/5 hip    Time 12    Period Weeks    Status Achieved;    Target Date 11/12/21      PT LONG TERM GOAL #4   Title Patient will improve FOTO score to >50% to indicate improved functional mobility with less pain with ADLs.    Baseline 4/4: 35%; 08/20/2021= 42% 7/5: 47%; 7:25:recently addressed; 11/30/21: 35; 9/26: 54%    Time 12    Period  Weeks    Status MET   Target Date      PT LONG TERM GOAL #5   Title Patient will report a worst pain of 4/10 in low back over last week to indicate improved tolerance with ADLs.    Baseline 4/4: 7/10; 08/20/2021= 4/10 , 7/5: worst 7/10 in low back; 7/25: 6/10 ; 8/15: pt reports 6/10; 9/26: 4/10   Time 12    Period Weeks    Status Achieved  Target Date 02/05/2022      Additional Long Term Goals   Additional Long Term Goals Yes      PT LONG TERM GOAL #6   Title Pt will increase 10MWT by at least 0.13 m/s in order to demonstrate clinically significant improvement in community ambulation    Baseline 08/20/2021=0.94 m/s 7/5: 1.5 m/s   Time 12    Period Weeks    Status Achieved;    Target Date 11/12/21      PT LONG TERM GOAL #7   Title Patient will demonstrate improved static standing balance as seen by ability to single leg stance on right LE > 12 sec consistently for optimal balance on level and unlevel surfaces.    Baseline 08/20/2021= 4 sec SLS on right , 7/5: 4-5 sec inconsistent; 7/25 able to achieve 15 sec after multiple attempts of 4-5 sec holds; 8/15: LLE 15 sec, RLE 8 sec; 9/26: LLE 30 sec, RLE 17 seconds   Time 12    Period Weeks    Status MET   Target Date 02/05/2022    PT LONG TERM GOAL #8  Title Pt will report ability to ambulate for at least 15 minutes without an increase in his LBP in order to improve community participation and ease with ADLs  Baseline 9/26: increase in pain at 15 min mark; 10/13: reports about 20 minutes.   Time 3   Period Weeks   Status MET  Target Date 02/05/2022        PT LONG TERM GOAL #9  Title Pt will report ability to ambulate for at least 45 minutes without an increase in his LBP in order to improve community participation and ease with ADLs  Baseline 9/26: increase in pain at 15 min mark; 10/13: reports about 20 minutes. 11/14: deferred; 11/21: Unable to complete 45 min gait without pain, but does report this has improved. Thinks he can do  30 min before pain increase.; 12/21: Pt unsure currently due to recent change in pain medication dose   Time 3   Period Weeks   Status ONGOING  Target Date        Plan -     Clinical Impression Statement Pt presents feeling unwell today, however, he is highly motivated to participate in session. Pt able to tolerate progression of core strengthening well without increased pain, but does report challenge and fatigue. Pt still struggles with balance tasks with RLE as primary or sole stance leg. The pt will continue to benefit from skilled physical therapy intervention to address impairments, improve QOL, and attain therapy goals.     Personal Factors and Comorbidities Comorbidity 3+;Past/Current Experience;Time since onset of injury/illness/exacerbation    Comorbidities PMHx includes: Bilateral LBP with right sided sciatica, Lumbosacral spondylosis myelopathy, closed compression Fx of L1. Has chronic insomnia. osteoarthritis, Panic anxiety syndrome (taking medication)    Examination-Activity Limitations Bend;Carry;Locomotion Level;Sit;Squat;Stairs;Stand;Transfers    Examination-Participation Restrictions Cleaning;Community Activity;Driving;Laundry;Meal Prep;Occupation;Shop;Volunteer;Yard Work    Stability/Clinical Decision Making Stable/Uncomplicated    Rehab Potential Good    PT Frequency 2x / week    PT Duration 12 weeks    PT Treatment/Interventions Cryotherapy;Electrical Stimulation;Moist Heat;Traction;Gait training;Stair training;Functional mobility training;Therapeutic activities;Therapeutic exercise;Balance training;Neuromuscular re-education;Patient/family education;Manual techniques;Passive range of motion;Dry needling;Energy conservation    PT Next Visit Plan  Progress core stabilization, Manual therapy for low back and LE ROM, instruct in balance HEP, continue plan       Consulted and Agree with Plan of Care Patient  1:09 PM, 04/25/22 Zollie Pee  PT    Physical Therapist - [facility]  Outpatient Physical Therapy- [place] [facility] [facility] SERVICES [address] [place], [place], [address] Phone: [phone]   Fax:  [phone]

## 2022-04-30 ENCOUNTER — Ambulatory Visit: Payer: 59

## 2022-04-30 DIAGNOSIS — R278 Other lack of coordination: Secondary | ICD-10-CM | POA: Diagnosis not present

## 2022-04-30 DIAGNOSIS — M6281 Muscle weakness (generalized): Secondary | ICD-10-CM | POA: Diagnosis not present

## 2022-04-30 DIAGNOSIS — R2681 Unsteadiness on feet: Secondary | ICD-10-CM

## 2022-04-30 DIAGNOSIS — G8929 Other chronic pain: Secondary | ICD-10-CM | POA: Diagnosis not present

## 2022-04-30 DIAGNOSIS — M545 Low back pain, unspecified: Secondary | ICD-10-CM | POA: Diagnosis not present

## 2022-04-30 DIAGNOSIS — R262 Difficulty in walking, not elsewhere classified: Secondary | ICD-10-CM | POA: Diagnosis not present

## 2022-04-30 NOTE — Therapy (Signed)
[facility] MAIN [facility] SERVICES [address] [place], [place], [address] Phone: [phone]   Fax:  [phone]  Patient Details  Name: David Reeves MRN: [id number] Date of Birth: 06/13/1970 Referring Provider:  Alfredia Ferguson, PA-C  Encounter Date: 04/30/2022   OUTPATIENT PHYSICAL THERAPY TREATMENT NOTE/RECERT      Patient Name: David Reeves MRN: [id number] DOB:02/13/1971, 52 y.o., male Today's Date: 04/30/2022  PCP: Alfredia Ferguson, PA-C REFERRING PROVIDER: Alfredia Ferguson, PA-C   PT End of Session - 04/30/22 1617     Visit Number 75    Number of Visits 77    Date for PT Re-Evaluation 06/25/22    Authorization Type [employer]    Authorization Time Period 02/01/22-04/26/22    Progress Note Due on Visit 70    PT Start Time 0932    PT Stop Time 1013    PT Time Calculation (min) 41 min    Equipment Utilized During Treatment Gait belt    Activity Tolerance Patient tolerated treatment well;No increased pain    Behavior During Therapy [facility] for tasks assessed/performed                 Past Medical History:  Diagnosis Date   Back pain 04/22/2012   Bone spur    Bulging disc 04/22/2012   Degenerative disc disease    Osteoarthritis    Panic anxiety syndrome    Stroke (HCC) 10/2020   Taking multiple medications for chronic disease    Past Surgical History:  Procedure Laterality Date   CYSTECTOMY     head   HERNIA REPAIR Left 2006,2014   [facility]   TOE SURGERY Right 2007   Patient Active Problem List   Diagnosis Date Noted   Alterations of sensations following cerebrovascular accident 03/08/2021   Aphasia as late effect of cerebrovascular accident (CVA) 03/08/2021   Muscle spasm 02/22/2021   Acute non-recurrent frontal sinusitis 02/22/2021   Chronic pain syndrome 01/23/2021   Primary hypertension 01/23/2021   Nerve pain 01/23/2021   Erectile dysfunction due to diseases classified elsewhere 01/23/2021    Cognitive dysfunction 01/23/2021   Scrotal edema 01/23/2021   Elevated LDL cholesterol level 01/23/2021   History of stroke with residual deficit 01/23/2021   Primary insomnia 01/23/2021   Mouth pain 01/23/2021   Laceration of right hand without foreign body    Closed compression fracture of L1 vertebra (HCC) 05/24/2016   Lumbar stenosis with neurogenic claudication 11/01/2015   Chronic bilateral low back pain with right-sided sciatica 11/08/2014   Hx of hemorrhoids 11/08/2014   AAA (abdominal aortic aneurysm) (HCC) 09/22/2014   Chronic pain associated with significant psychosocial dysfunction 09/22/2014   Panic disorder 09/22/2014   AB (asthmatic bronchitis) 08/17/2014   Anxiety disorder due to general medical condition 08/17/2014   Backache 08/17/2014   Lumbosacral spondylosis without myelopathy 08/17/2014   Disorder of male genital organs 08/17/2014   Brash 08/17/2014   Low back pain 08/17/2014   Tendon nodule 08/17/2014   Episodic paroxysmal anxiety disorder 08/17/2014   Hernia, inguinal, right 08/17/2014   Fast heart beat 08/17/2014   Illness 08/17/2014   Inguinal hernia 10/14/2012    REFERRING DIAG: History of stroke with residual deficit, chronic bilateral low back pain with right-sided sciatica, Lumbosacral spondylosis without myelopathy THERAPY DIAG:  Chronic bilateral low back pain without sciatica  Muscle weakness (generalized)  Unsteadiness on feet  Rationale for Evaluation and Treatment Rehabilitation  PERTINENT HISTORY: Pt. is a 52 y.o. who was diagnosed  with a CVA on July 21st, 2022. Pt. completed several weeks of inpatient rehab at [facility]. After returning to home. He was discharged in late August/early September and recieved home health PT. Pt. sustained a fall in december of 2022, and was admitted to the hospital with COVID-19, and Chronic back pain. He reports chronic back pain syndrome since 2012. Since the most recent discharge, Pt. has been residing with the  ex-wife until the Pt. is ready to return to independent living. He did recieve 2 weeks of home health after discharge in December 2022. He is now being referred to outpatient PT to address weakness from stroke and improve fine motor movement. He reports rarely getting numbness/tingling in RLE. PMHx includes: Bilateral LBP with right sided sciatica, Lumbosacral spondylosis myelopathy, closed compression Fx of L1. Has chronic insomnia. osteoarthritis, Panic anxiety syndrome (taking medication) Pt. enjoys cooking, and riding motorcycles. Patient is going to [facility] for Epidural spinal injections on 06/07/21;  PRECAUTIONS: Fall, Spinal Brace wears daily, approximately 25%, when exercising; semi-rigid lumbar brace  SUBJECTIVE: Pt states, "I've been better." He reports he can't be seen until March 20th for UE device. Reports no stumbles/falls.  Rates back pain 2/10.   PAIN:  Are you having pain? Yes: NPRS scale: 2/10 Pain location: bilat low back Pain description: dull ache; always constant,  Aggravating factors: worse with sitting,  Relieving factors: heat helps temporarily, pain pills/pain patch   TODAY'S TREATMENT 04/30/22   TherEx:  Heat donned to low back (low back checked prior to use of heat and is WNL, nothing currently applied to back, no adverse reaction to modality reported or observed. Pt  continues to report that heat feels good)  On mat table- LTRs 10x each side SLR 20x each LE LTR 4x each side 2# weights donned each LE 90:90 core/deadbug progression, with TrA activation and pelvic tilt 10x 10 sec hold/rep. Rates easy-medium  ---2# weights donned each LE - Dead bug with one leg 90:90 position and other leg in full knee ext tapping and raising from table 12x, 15x each LE. Reports difficulty with coordination   Side-lying hip abduction  with 5# weights 25x each LE.Rates medium   NMR:  Gait belt donned throughout - several minutes of the following At board standing on  airex writing out alphabet, drawing to promote reaching LTL and across midline for dynamic balance - up to min assist SLB each LE on airex 30 sec each - intermittent UE support throughout   Reports no increase in back pain with all interventions, pain remains at baseline  PATIENT EDUCATION:  Education details: Pt educated throughout session about proper posture and technique with exercises. Improved exercise technique, movement at target joints, use of target muscles after min to mod verbal, visual, tactile cues. Plan, frequency  Person educated: Patient Education method: Explanation, Demonstration, Tactile cues, and Verbal cues Education comprehension: verbalized understanding, returned demonstration, verbal cues required, tactile cues required, and needs further education   HOME EXERCISE PROGRAM: No updates on this date pt to continue HEP as previously given     PT Short Term Goals - TARGET DATE .05/28/2022         PT SHORT TERM GOAL #1   Title Patient will be adherent to HEP at least 3x a week to improve functional strength and balance for better safety at home.    Baseline 4/4: doing them 1-2x a week; 08/20/2021=Patient reports performing his low back stretching and no questions currently HEP. 10/24/21: doing HEP  4x a week   Time 4    Period Weeks    Status Achieved    Target Date 08/21/21      PT SHORT TERM GOAL #2   Title Patient (< 46 years old) will complete five times sit to stand test in < 15 seconds indicating an increased LE strength and improved balance.    Baseline 4/4: 17.85 sec with arms across chest; 08/20/2021= 17.6 sec , 7/5: 12.85 sec   Time 4    Period Weeks    Status Achieved;    Target Date 10/01/21              PT Long Term Goals -  TARGET DATE FOR GOALS 06/25/2022        PT LONG TERM GOAL #1   Title Patient will increase six minute walk test distance to >1000 for progression to community ambulator and improve gait ability    Baseline 4/4: 1115 feet  , 7/5: 1080 feet;    Time 4    Period Weeks    Status Achieved    Target Date 08/21/21      PT LONG TERM GOAL #2   Title Patient will ascend/descend 8 stairs without rail assist independently without loss of balance to improve ability to get in/out of home.    Baseline 4/4: requires 1 rail assist; 08/20/2021- Patient demonstrated steps without railing using reciprocal steps today. 7/5: ascend/descend 6 steps reciprocally without rail, but required CGA for safety when descending; 7/25: Able to complete without UE support and good balance going up steps, unsafe descending steps without UE support. Required min assist and UE support to prevent LOB descending.; 8/15: requires UUE support to descend and close CGA due to; unsteadiness; 9/26: ascend without UE support, recip pattern; descend with UUE support, recip stepping, but still doesn't feel steady yet with descending; 10/13 able to demo recip pattern ascending hands free, required instance of UE support with descending, recip pattern due to heel getting caught under step; 11/14: deferred; 11/21: mod assist with descending due to unsteadiness, able to complete without UE support ascending; 12/21: Pt reports walking down about 24 stairs without BUE support, reports he did misstep partially but recovered his balance,    Time 12*corrected   Period Weeks    Status Partially met   Target Date      PT LONG TERM GOAL #3   Title Patient will increase BLE gross strength to 4+/5 as to improve functional strength for independent gait, increased standing tolerance and increased ADL ability.    Baseline 4/4: not formally assessed; 08/20/2021= 4+/5 with Right hip flex/knee ext/flex; Right hip abd= 4/5, 7/5: 4+/5 hip    Time 12    Period Weeks    Status Achieved;    Target Date 11/12/21      PT LONG TERM GOAL #4   Title Patient will improve FOTO score to >50% to indicate improved functional mobility with less pain with ADLs.    Baseline 4/4: 35%; 08/20/2021= 42%  7/5: 47%; 7:25:recently addressed; 11/30/21: 35; 9/26: 54%    Time 12    Period Weeks    Status MET   Target Date      PT LONG TERM GOAL #5   Title Patient will report a worst pain of 4/10 in low back over last week to indicate improved tolerance with ADLs.    Baseline 4/4: 7/10; 08/20/2021= 4/10 , 7/5: worst 7/10 in low back; 7/25: 6/10 ; 8/15: pt reports 6/10;  9/26: 4/10; 04/30/22: pt reports worst pain is 4/10   Time 12    Period Weeks    Status Achieved    Target Date 02/05/2022      Additional Long Term Goals   Additional Long Term Goals Yes      PT LONG TERM GOAL #6   Title Pt will increase by at least 0.13 m/s in order to demonstrate clinically significant improvement in community ambulation    Baseline 08/20/2021=0.94 m/s 7/5: 1.5 m/s   Time 12    Period Weeks    Status Achieved;    Target Date 11/12/21      PT LONG TERM GOAL #7   Title Patient will demonstrate improved static standing balance as seen by ability to single leg stance on right LE > 12 sec consistently for optimal balance on level and unlevel surfaces.    Baseline 08/20/2021= 4 sec SLS on right , 7/5: 4-5 sec inconsistent; 7/25 able to achieve 15 sec after multiple attempts of 4-5 sec holds; 8/15: LLE 15 sec, RLE 8 sec; 9/26: LLE 30 sec, RLE 17 seconds   Time 12    Period Weeks    Status MET   Target Date 02/05/2022    PT LONG TERM GOAL #8  Title Pt will report ability to ambulate for at least 15 minutes without an increase in his LBP in order to improve community participation and ease with ADLs  Baseline 9/26: increase in pain at 15 min mark; 10/13: reports about 20 minutes.   Time 3   Period Weeks   Status MET  Target Date 02/05/2022        PT LONG TERM GOAL #9  Title Pt will report ability to ambulate for at least 45 minutes without an increase in his LBP in order to improve community participation and ease with ADLs  Baseline 9/26: increase in pain at 15 min mark; 10/13: reports about 20 minutes.  11/14: deferred; 11/21: Unable to complete 45 min gait without pain, but does report this has improved. Thinks he can do 30 min before pain increase.; 12/21: Pt unsure currently due to recent change in pain medication dose; 04/30/2022: pt reports he has not walked as much lately, is unsure  Time 3   Period Weeks   Status ONGOING  Target Date        Plan -     Clinical Impression Statement Pt presents for recert today. LBP has remained steady around 1-2/10 over reporting period with no symptom exacerbation with interventions. Pt unsure if he has progressed with his ability to ambulate for longer distances without LBP increase. Generally, within sessions pt has steadily been able to increase to more difficult and complex therex and balance interventions. Pt agreeable at this point to reduce frequency to 1x/week. Patient's condition has the potential to improve in response to therapy. Maximum improvement is yet to be obtained. The anticipated improvement is attainable and reasonable in a generally predictable time. The pt will continue to benefit from skilled physical therapy intervention to address impairments, improve QOL, and attain therapy goals.     Personal Factors and Comorbidities Comorbidity 3+;Past/Current Experience;Time since onset of injury/illness/exacerbation    Comorbidities PMHx includes: Bilateral LBP with right sided sciatica, Lumbosacral spondylosis myelopathy, closed compression Fx of L1. Has chronic insomnia. osteoarthritis, Panic anxiety syndrome (taking medication)    Examination-Activity Limitations Bend;Carry;Locomotion Level;Sit;Squat;Stairs;Stand;Transfers    Examination-Participation Restrictions Cleaning;Community Activity;Driving;Laundry;Meal Prep;Occupation;Shop;Volunteer;Yard Work    Stability/Clinical Decision Making Stable/Uncomplicated  Rehab Potential Good    PT Frequency 1x / week    PT Duration 8 weeks    PT Treatment/Interventions Cryotherapy;Electrical  Stimulation;Moist Heat;Traction;Gait training;Stair training;Functional mobility training;Therapeutic activities;Therapeutic exercise;Balance training;Neuromuscular re-education;Patient/family education;Manual techniques;Passive range of motion;Dry needling;Energy conservation    PT Next Visit Plan  Progress core stabilization, Manual therapy for low back and LE ROM, instruct in balance HEP, continue plan       Consulted and Agree with Plan of Care Patient              4:27 PM, 04/30/22 Zollie Pee PT    Physical Therapist - [facility]  Outpatient Physical Therapy- [facility] [address] [place], [place], [address] Phone: [phone]   Fax:  [phone]

## 2022-05-01 ENCOUNTER — Ambulatory Visit (INDEPENDENT_AMBULATORY_CARE_PROVIDER_SITE_OTHER): Payer: Medicare Other

## 2022-05-01 VITALS — Ht 73.0 in | Wt 211.0 lb

## 2022-05-01 DIAGNOSIS — Z79891 Long term (current) use of opiate analgesic: Secondary | ICD-10-CM | POA: Diagnosis not present

## 2022-05-01 DIAGNOSIS — Z0000 Encounter for general adult medical examination without abnormal findings: Secondary | ICD-10-CM

## 2022-05-01 DIAGNOSIS — I10 Essential (primary) hypertension: Secondary | ICD-10-CM | POA: Diagnosis not present

## 2022-05-01 DIAGNOSIS — I69351 Hemiplegia and hemiparesis following cerebral infarction affecting right dominant side: Secondary | ICD-10-CM | POA: Diagnosis not present

## 2022-05-01 DIAGNOSIS — G8921 Chronic pain due to trauma: Secondary | ICD-10-CM | POA: Diagnosis not present

## 2022-05-01 DIAGNOSIS — M791 Myalgia, unspecified site: Secondary | ICD-10-CM | POA: Diagnosis not present

## 2022-05-01 NOTE — Patient Instructions (Signed)
David Reeves , Thank you for taking time to come for your Medicare Wellness Visit. I appreciate your ongoing commitment to your health goals. Please review the following plan we discussed and let me know if I can assist you in the future.   Screening recommendations/referrals: Colonoscopy: has Cologuard kit, needs to do it Recommended yearly ophthalmology/optometry visit for glaucoma screening and checkup Recommended yearly dental visit for hygiene and checkup  Vaccinations: Influenza vaccine: 12/27/21 Pneumococcal vaccine: n/d Tdap vaccine: 12/14/09, due if have injury Shingles vaccine: n/d   Covid-19: n/d  Advanced directives: no  Conditions/risks identified: none  Next appointment: Follow up in one year for your annual wellness visit 05/06/23 @ 10:45 am by phone  Preventive Care 53-64 Years, Male Preventive care refers to lifestyle choices and visits with your health care provider that can promote health and wellness. What does preventive care include? A yearly physical exam. This is also called an annual well check. Dental exams once or twice a year. Routine eye exams. Ask your health care provider how often you should have your eyes checked. Personal lifestyle choices, including: Daily care of your teeth and gums. Regular physical activity. Eating a healthy diet. Avoiding tobacco and drug use. Limiting alcohol use. Practicing safe sex. Taking low-dose aspirin every day starting at age 53. What happens during an annual well check? The services and screenings done by your health care provider during your annual well check will depend on your age, overall health, lifestyle risk factors, and family history of disease. Counseling  Your health care provider may ask you questions about your: Alcohol use. Tobacco use. Drug use. Emotional well-being. Home and relationship well-being. Sexual activity. Eating habits. Work and work Statistician. Screening  You may have the  following tests or measurements: Height, weight, and BMI. Blood pressure. Lipid and cholesterol levels. These may be checked every 5 years, or more frequently if you are over 53 years old. Skin check. Lung cancer screening. You may have this screening every year starting at age 53 if you have a 30-pack-year history of smoking and currently smoke or have quit within the past 15 years. Fecal occult blood test (FOBT) of the stool. You may have this test every year starting at age 53. Flexible sigmoidoscopy or colonoscopy. You may have a sigmoidoscopy every 5 years or a colonoscopy every 10 years starting at age 53. Prostate cancer screening. Recommendations will vary depending on your family history and other risks. Hepatitis C blood test. Hepatitis B blood test. Sexually transmitted disease (STD) testing. Diabetes screening. 52 is done by checking your blood sugar (glucose) after you have not eaten for a while (fasting). You may have this done every 1-3 years. Discuss your test results, treatment options, and if necessary, the need for more tests with your health care provider. Vaccines  Your health care provider may recommend certain vaccines, such as: Influenza vaccine. This is recommended every year. Tetanus, diphtheria, and acellular pertussis (Tdap, Td) vaccine. You may need a Td booster every 10 years. Zoster vaccine. You may need this after age 53. Pneumococcal 13-valent conjugate (PCV13) vaccine. You may need this if you have certain conditions and have not been vaccinated. Pneumococcal polysaccharide (PPSV23) vaccine. You may need one or two doses if you smoke cigarettes or if you have certain conditions. Talk to your health care provider about which screenings and vaccines you need and how often you need them. This information is not intended to replace advice given to you by your health care  provider. Make sure you discuss any questions you have with your health care  provider. Document Released: 05/05/2015 Document Revised: 12/27/2015 Document Reviewed: 02/07/2015 Elsevier Interactive Patient Education  2017 [employer].  Fall Prevention in the Home Falls can cause injuries. They can happen to people of all ages. There are many things you can do to make your home safe and to help prevent falls. What can I do on the outside of my home? Regularly fix the edges of walkways and driveways and fix any cracks. Remove anything that might make you trip as you walk through a door, such as a raised step or threshold. Trim any bushes or trees on the path to your home. Use bright outdoor lighting. Clear any walking paths of anything that might make someone trip, such as rocks or tools. Regularly check to see if handrails are loose or broken. Make sure that both sides of any steps have handrails. Any raised decks and porches should have guardrails on the edges. Have any leaves, snow, or ice cleared regularly. Use sand or salt on walking paths during winter. Clean up any spills in your garage right away. This includes oil or grease spills. What can I do in the bathroom? Use night lights. Install grab bars by the toilet and in the tub and shower. Do not use towel bars as grab bars. Use non-skid mats or decals in the tub or shower. If you need to sit down in the shower, use a plastic, non-slip stool. Keep the floor dry. Clean up any water that spills on the floor as soon as it happens. Remove soap buildup in the tub or shower regularly. Attach bath mats securely with double-sided non-slip rug tape. Do not have throw rugs and other things on the floor that can make you trip. What can I do in the bedroom? Use night lights. Make sure that you have a light by your bed that is easy to reach. Do not use any sheets or blankets that are too big for your bed. They should not hang down onto the floor. Have a firm chair that has side arms. You can use this for support while  you get dressed. Do not have throw rugs and other things on the floor that can make you trip. What can I do in the kitchen? Clean up any spills right away. Avoid walking on wet floors. Keep items that you use a lot in easy-to-reach places. If you need to reach something above you, use a strong step stool that has a grab bar. Keep electrical cords out of the way. Do not use floor polish or wax that makes floors slippery. If you must use wax, use non-skid floor wax. Do not have throw rugs and other things on the floor that can make you trip. What can I do with my stairs? Do not leave any items on the stairs. Make sure that there are handrails on both sides of the stairs and use them. Fix handrails that are broken or loose. Make sure that handrails are as long as the stairways. Check any carpeting to make sure that it is firmly attached to the stairs. Fix any carpet that is loose or worn. Avoid having throw rugs at the top or bottom of the stairs. If you do have throw rugs, attach them to the floor with carpet tape. Make sure that you have a light switch at the top of the stairs and the bottom of the stairs. If you do not have  them, ask someone to add them for you. What else can I do to help prevent falls? Wear shoes that: Do not have high heels. Have rubber bottoms. Are comfortable and fit you well. Are closed at the toe. Do not wear sandals. If you use a stepladder: Make sure that it is fully opened. Do not climb a closed stepladder. Make sure that both sides of the stepladder are locked into place. Ask someone to hold it for you, if possible. Clearly mark and make sure that you can see: Any grab bars or handrails. First and last steps. Where the edge of each step is. Use tools that help you move around (mobility aids) if they are needed. These include: Canes. Walkers. Scooters. Crutches. Turn on the lights when you go into a dark area. Replace any light bulbs as soon as they burn  out. Set up your furniture so you have a clear path. Avoid moving your furniture around. If any of your floors are uneven, fix them. If there are any pets around you, be aware of where they are. Review your medicines with your doctor. Some medicines can make you feel dizzy. This can increase your chance of falling. Ask your doctor what other things that you can do to help prevent falls. This information is not intended to replace advice given to you by your health care provider. Make sure you discuss any questions you have with your health care provider. Document Released: 02/02/2009 Document Revised: 09/14/2015 Document Reviewed: 05/13/2014 Elsevier Interactive Patient Education  2017 [employer].

## 2022-05-01 NOTE — Progress Notes (Signed)
Virtual Visit via Telephone Note  I connected with  Reeves Reeves on 05/01/22 at 11:15 AM EST by telephone and verified that I am speaking with the correct person using two identifiers.  Location: Patient: home Provider: [facility] Persons participating in the virtual visit: [place]   I discussed the limitations, risks, security and privacy concerns of performing an evaluation and management service by telephone and the availability of in person appointments. The patient expressed understanding and agreed to proceed.  Interactive audio and video telecommunications were attempted between this nurse and patient, however failed, due to patient having technical difficulties OR patient did not have access to video capability.  We continued and completed visit with audio only.  Some vital signs may be absent or patient reported.   Reeves David, LPN  Subjective:   Reeves Reeves is a 52 y.o. male who presents for Medicare Annual/Subsequent preventive examination.  Review of Systems     Cardiac Risk Factors include: advanced age (>29men, >64 women);male gender;hypertension     Objective:    Today's Vitals   05/01/22 1113  PainSc: 5    There is no height or weight on file to calculate BMI.     05/01/2022   11:20 AM 05/30/2021    9:48 AM 05/23/2021    8:46 AM 04/04/2021    4:36 PM 11/28/2017    9:39 PM 05/10/2017    1:02 AM 05/08/2017    6:22 PM  Advanced Directives  Does Patient Have a Medical Advance Directive? No No No No No  No  Does patient want to make changes to medical advance directive?  No - Patient declined Yes (Inpatient - patient requests chaplain consult to change a medical advance directive)      Would patient like information on creating a medical advance directive? No - Patient declined No - Patient declined   No - Patient declined       Information is confidential and restricted. Go to Review Flowsheets to unlock data.    Current  Medications (verified) Outpatient Encounter Medications as of 05/01/2022  Medication Sig   acetaminophen (TYLENOL) 500 MG tablet Take 1,000 mg by mouth every 6 (six) hours as needed for mild pain.   amLODipine (NORVASC) 5 MG tablet TAKE 1 TABLET (5 MG TOTAL) BY MOUTH DAILY.   aspirin EC 81 MG tablet Take 81 mg by mouth daily. Swallow whole.   atorvastatin (LIPITOR) 80 MG tablet TAKE 1 TABLET BY MOUTH EVERY DAY   buprenorphine (BUTRANS) 10 MCG/HR PTWK 1 patch once a week.   celecoxib (CELEBREX) 100 MG capsule TAKE 1 CAPSULE BY MOUTH TWICE  DAILY   Cholecalciferol (VITAMIN D3) 2000 units TABS Take by mouth.   diazepam (VALIUM) 5 MG tablet TAKE 1 TABLET (5 MG TOTAL) BY MOUTH EVERY 12 HOURS AS NEEDED FOR MUSCLE SPASM   donepezil (ARICEPT) 10 MG tablet TAKE 1 TABLET BY MOUTH AT  BEDTIME   gabapentin (NEURONTIN) 400 MG capsule TAKE 1 CAPSULE BY MOUTH 3 TIMES  DAILY   Multiple Vitamin (MULTIVITAMIN) tablet Take 1 tablet by mouth daily.   prazosin (MINIPRESS) 1 MG capsule TAKE 2 CAPSULES BY MOUTH AT  BEDTIME , DO NOT TAKE WITH  AMBIEN   No facility-administered encounter medications on file as of 05/01/2022.    Allergies (verified) Duloxetine, Amitriptyline, Buspirone, Ciprofloxacin, Doxepin, Escitalopram, Olanzapine, Penicillins, Sertraline, and Trazodone   History: Past Medical History:  Diagnosis Date   Back pain 04/22/2012   Bone spur  Bulging disc 04/22/2012   Degenerative disc disease    Osteoarthritis    Panic anxiety syndrome    Stroke [facility]) 10/2020   Taking multiple medications for chronic disease    Past Surgical History:  Procedure Laterality Date   CYSTECTOMY     head   HERNIA REPAIR Left 2006,2014   [facility]   TOE SURGERY Right 2007   Family History  Problem Relation Age of Onset   Diabetes Mother    Diabetes Maternal Uncle    Diabetes Maternal Grandmother    Heart disease Maternal Grandmother    Social History   Socioeconomic History   Marital status: Single     Spouse name: mikki    Number of children: 1   Years of education: Not on file   Highest education level: Some college, no degree  Occupational History   Not on file  Tobacco Use   Smoking status: Never   Smokeless tobacco: Never   Tobacco comments:    cbd    Vaping Use   Vaping Use: Never used  Substance and Sexual Activity   Alcohol use: Not Currently    Comment: last used 12-22-17   Drug use: Not Currently    Types: Marijuana    Comment: October 14, 2017   Sexual activity: Not Currently  Other Topics Concern   Not on file  Social History Narrative   Not on file   Social Determinants of Health   Financial Resource Strain: Low Risk  (05/01/2022)   Overall Financial Resource Strain (CARDIA)    Difficulty of Paying Living Expenses: Not hard at all  Food Insecurity: No Food Insecurity (05/01/2022)   Hunger Vital Sign    Worried About Running Out of Food in the Last Year: Never true    Ran Out of Food in the Last Year: Never true  Transportation Needs: No Transportation Needs (05/01/2022)   PRAPARE - Administrator, Civil Service (Medical): No    Lack of Transportation (Non-Medical): No  Physical Activity: Insufficiently Active (05/01/2022)   Exercise Vital Sign    Days of Exercise per Week: 2 days    Minutes of Exercise per Session: 20 min  Stress: No Stress Concern Present (05/01/2022)   [employer] of Occupational Health - Occupational Stress Questionnaire    Feeling of Stress : Only a little  Social Connections: Socially Isolated (05/01/2022)   Social Connection and Isolation Panel [NHANES]    Frequency of Communication with Friends and Family: More than three times a week    Frequency of Social Gatherings with Friends and Family: Three times a week    Attends Religious Services: Never    Active Member of Clubs or Organizations: No    Attends Banker Meetings: Never    Marital Status: Divorced    Tobacco Counseling Counseling given: Not  Answered Tobacco comments: cbd     Clinical Intake:  Pre-visit preparation completed: Yes  Pain : 0-10 Pain Score: 5  Pain Type: Chronic pain Pain Location: Back Pain Orientation: Lower     Nutritional Risks: None Diabetes: No  How often do you need to have someone help you when you read instructions, pamphlets, or other written materials from your doctor or pharmacy?: 1 - Never  Diabetic?no  Interpreter Needed?: No  Information entered by :: Kennedy Bucker, LPN   Activities of Daily Living    05/01/2022   11:22 AM 04/27/2022   11:57 AM  In your present state of health,  do you have any difficulty performing the following activities:  Hearing? 0 0  Vision? 0 0  Difficulty concentrating or making decisions? 0 0  Walking or climbing stairs? 0 1  Dressing or bathing? 0 0  Doing errands, shopping? 1 0  Preparing Food and eating ? N N  Using the Toilet? N N  In the past six months, have you accidently leaked urine? N N  Do you have problems with loss of bowel control? N N  Managing your Medications? N N  Managing your Finances? N N  Housekeeping or managing your Housekeeping? Malvin Johns    Patient Care Team: Alfredia Ferguson, PA-C as PCP - General (Physician Assistant) Kieth Brightly, MD (General Surgery) Alfredia Ferguson, PA-C as Physician Assistant (Physician Assistant)  Indicate any recent Medical Services you may have received from other than [facility] providers in the past year (date may be approximate).     Assessment:   This is a routine wellness examination for Reeves Reeves.  Hearing/Vision screen Hearing Screening - Comments:: No aids Vision Screening - Comments:: Glasses, but doesn't wear- Dr.Woodard  Dietary issues and exercise activities discussed: Current Exercise Habits: Home exercise routine, Type of exercise: stretching, Time (Minutes): 20, Frequency (Times/Week): 2, Weekly Exercise (Minutes/Week): 40, Intensity: Mild   Goals Addressed              This Visit's Progress    DIET - EAT MORE FRUITS AND VEGETABLES         Depression Screen    05/01/2022   11:19 AM 12/27/2021    4:06 PM 05/03/2021    3:06 PM 02/12/2021   11:35 AM 02/14/2020   12:51 PM 04/05/2019   11:16 AM 09/10/2018    2:13 PM  PHQ 2/9 Scores  PHQ - 2 Score 0 0 2 1 0 0 0  PHQ- 9 Score 0 4 11        Fall Risk    05/01/2022   11:21 AM 04/27/2022   11:57 AM 12/27/2021    4:06 PM 05/03/2021    3:04 PM 02/12/2021   11:08 AM  Fall Risk   Falls in the past year? 0 0 1 1 1   Number falls in past yr: 0  1 1 0  Injury with Fall? 0  0 1 0  Comment    4   Risk for fall due to : No Fall Risks  History of fall(s)  Medication side effect;Other (Comment)  Risk for fall due to: Comment     Chronic Pain  Follow up Falls prevention discussed;Falls evaluation completed  Falls evaluation completed      FALL RISK PREVENTION PERTAINING TO THE HOME:  Any stairs in or around the home? Yes  If so, are there any without handrails? No  Home free of loose throw rugs in walkways, pet beds, electrical cords, etc? Yes  Adequate lighting in your home to reduce risk of falls? Yes   ASSISTIVE DEVICES UTILIZED TO PREVENT FALLS:  Life alert? No  Use of a cane, walker or w/c? No  Grab bars in the bathroom? Yes  Shower chair or bench in shower? Yes  Elevated toilet seat or a handicapped toilet? Yes    Cognitive Function:        05/01/2022   11:23 AM  6CIT Screen  What Year? 0 points  What month? 0 points  What time? 0 points  Count back from 20 0 points  Months in reverse 0 points  Repeat phrase 2  points  Total Score 2 points    Immunizations Immunization History  Administered Date(s) Administered   Influenza,inj,Quad PF,6+ Mos 03/24/2014, 01/23/2021, 12/27/2021   Influenza-Unspecified 03/24/2014   Tdap 12/14/2009    TDAP status: Due, Education has been provided regarding the importance of this vaccine. Advised may receive this vaccine at local pharmacy or Health Dept.  Aware to provide a copy of the vaccination record if obtained from local pharmacy or Health Dept. Verbalized acceptance and understanding.  Flu Vaccine status: Up to date  Pneumococcal vaccine status: Declined,  Education has been provided regarding the importance of this vaccine but patient still declined. Advised may receive this vaccine at local pharmacy or Health Dept. Aware to provide a copy of the vaccination record if obtained from local pharmacy or Health Dept. Verbalized acceptance and understanding.   Covid-19 vaccine status: Completed vaccines  Qualifies for Shingles Vaccine? Yes   Zostavax completed No   Shingrix Completed?: No.    Education has been provided regarding the importance of this vaccine. Patient has been advised to call insurance company to determine out of pocket expense if they have not yet received this vaccine. Advised may also receive vaccine at local pharmacy or Health Dept. Verbalized acceptance and understanding.  Screening Tests Health Maintenance  Topic Date Due   Zoster Vaccines- Shingrix (1 of 2) Never done   Fecal DNA (Cologuard)  Never done   DTaP/Tdap/Td (2 - Td or Tdap) 12/15/2019   Hepatitis C Screening  12/28/2022 (Originally 01/14/1989)   HIV Screening  12/28/2022 (Originally 01/13/1986)   Medicare Annual Wellness (AWV)  05/02/2023   INFLUENZA VACCINE  Completed   HPV VACCINES  Aged Out    Health Maintenance  Health Maintenance Due  Topic Date Due   Zoster Vaccines- Shingrix (1 of 2) Never done   Fecal DNA (Cologuard)  Never done   DTaP/Tdap/Td (2 - Td or Tdap) 12/15/2019    Declined colonoscopy referral, has Cologuard kit  Lung Cancer Screening: (Low Dose CT Chest recommended if Age 35-80 years, 30 pack-year currently smoking OR have quit w/in 15years.) does not qualify.   Additional Screening:  Hepatitis C Screening: does qualify; Completed no  Vision Screening: Recommended annual ophthalmology exams for early detection of glaucoma  and other disorders of the eye. Is the patient up to date with their annual eye exam?  Yes  Who is the provider or what is the name of the office in which the patient attends annual eye exams? Dr.Woodard If pt is not established with a provider, would they like to be referred to a provider to establish care? No .   Dental Screening: Recommended annual dental exams for proper oral hygiene  Community Resource Referral / Chronic Care Management: CRR required this visit?  No   CCM required this visit?  No      Plan:     I have personally reviewed and noted the following in the patient's chart:   Medical and social history Use of alcohol, tobacco or illicit drugs  Current medications and supplements including opioid prescriptions. Patient is currently taking opioid prescriptions. Information provided to patient regarding non-opioid alternatives. Patient advised to discuss non-opioid treatment plan with their provider. Functional ability and status Nutritional status Physical activity Advanced directives List of other physicians Hospitalizations, surgeries, and ER visits in previous 12 months Vitals Screenings to include cognitive, depression, and falls Referrals and appointments  In addition, I have reviewed and discussed with patient certain preventive protocols, quality metrics, and best  practice recommendations. A written personalized care plan for preventive services as well as general preventive health recommendations were provided to patient.     Reeves David, LPN   [id number]/50/[id number]   Nurse Notes: none

## 2022-05-02 ENCOUNTER — Ambulatory Visit: Payer: 59

## 2022-05-04 DIAGNOSIS — Z8673 Personal history of transient ischemic attack (TIA), and cerebral infarction without residual deficits: Secondary | ICD-10-CM | POA: Diagnosis not present

## 2022-05-06 ENCOUNTER — Encounter: Payer: Self-pay | Admitting: Physician Assistant

## 2022-05-07 ENCOUNTER — Ambulatory Visit: Payer: 59

## 2022-05-07 DIAGNOSIS — R2681 Unsteadiness on feet: Secondary | ICD-10-CM

## 2022-05-07 DIAGNOSIS — R278 Other lack of coordination: Secondary | ICD-10-CM | POA: Diagnosis not present

## 2022-05-07 DIAGNOSIS — M6281 Muscle weakness (generalized): Secondary | ICD-10-CM | POA: Diagnosis not present

## 2022-05-07 DIAGNOSIS — R262 Difficulty in walking, not elsewhere classified: Secondary | ICD-10-CM | POA: Diagnosis not present

## 2022-05-07 DIAGNOSIS — M545 Low back pain, unspecified: Secondary | ICD-10-CM | POA: Diagnosis not present

## 2022-05-07 DIAGNOSIS — G8929 Other chronic pain: Secondary | ICD-10-CM

## 2022-05-07 NOTE — Therapy (Signed)
[place] MAIN [facility] SERVICES [address] [place], [place], [address] Phone: [phone]   Fax:  [phone]  Patient Details  Name: David Reeves MRN: [id number] Date of Birth: 01-04-1971 Referring Provider:  [name], PA-C  Encounter Date: 05/07/2022   OUTPATIENT PHYSICAL THERAPY TREATMENT NOTE/RECERT      Patient Name: David Reeves MRN: [id number] DOB:May 09, 1970, 52 y.o., male Today's Date: 05/07/2022  PCP: [name], PA-C REFERRING PROVIDER: [name], PA-C   PT End of Session - 05/07/22 1315     Visit Number 45    Number of Visits 4    Date for PT Re-Evaluation 06/25/22    Authorization Type [employer]    Authorization Time Period 02/01/22-04/26/22    Progress Note Due on Visit 17    PT Start Time 0930    PT Stop Time 1013    PT Time Calculation (min) 43 min    Equipment Utilized During Treatment Gait belt    Activity Tolerance Patient tolerated treatment well;No increased pain    Behavior During Therapy [facility] for tasks assessed/performed                 Past Medical History:  Diagnosis Date   Back pain 04/22/2012   Bone spur    Bulging disc 04/22/2012   Degenerative disc disease    Osteoarthritis    Panic anxiety syndrome    Stroke ([place]) 10/2020   Taking multiple medications for chronic disease    Past Surgical History:  Procedure Laterality Date   CYSTECTOMY     head   HERNIA REPAIR Left 2006,2014   [facility]   TOE SURGERY Right 2007   Patient Active Problem List   Diagnosis Date Noted   Alterations of sensations following cerebrovascular accident 03/08/2021   Aphasia as late effect of cerebrovascular accident (CVA) 03/08/2021   Muscle spasm 02/22/2021   Acute non-recurrent frontal sinusitis 02/22/2021   Chronic pain syndrome 01/23/2021   Primary hypertension 01/23/2021   Nerve pain 01/23/2021   Erectile dysfunction due to diseases classified elsewhere 01/23/2021    Cognitive dysfunction 01/23/2021   Scrotal edema 01/23/2021   Elevated LDL cholesterol level 01/23/2021   History of stroke with residual deficit 01/23/2021   Primary insomnia 01/23/2021   Mouth pain 01/23/2021   Acute ischemic stroke ([place]) 11/10/2020   Closed compression fracture of L1 vertebra ([place]) 05/24/2016   Lumbar stenosis with neurogenic claudication 11/01/2015   Chronic bilateral low back pain with right-sided sciatica 11/08/2014   Hx of hemorrhoids 11/08/2014   AAA (abdominal aortic aneurysm) ([place]) 09/22/2014   Chronic pain associated with significant psychosocial dysfunction 09/22/2014   Panic disorder 09/22/2014   AB (asthmatic bronchitis) 08/17/2014   Anxiety disorder due to general medical condition 08/17/2014   Backache 08/17/2014   Lumbosacral spondylosis without myelopathy 08/17/2014   Disorder of male genital organs 08/17/2014   Brash 08/17/2014   Low back pain 08/17/2014   Tendon nodule 08/17/2014   Episodic paroxysmal anxiety disorder 08/17/2014   Hernia, inguinal, right 08/17/2014   Fast heart beat 08/17/2014   Inguinal hernia 10/14/2012    REFERRING DIAG: History of stroke with residual deficit, chronic bilateral low back pain with right-sided sciatica, Lumbosacral spondylosis without myelopathy THERAPY DIAG:  Unsteadiness on feet  Muscle weakness (generalized)  Chronic bilateral low back pain without sciatica  Rationale for Evaluation and Treatment Rehabilitation  PERTINENT HISTORY: Pt. is a 52 y.o. who was diagnosed with a CVA on July 21st, 2022.  Pt. completed several weeks of inpatient rehab at [facility]. After returning to home. He was discharged in late August/early September and recieved home health PT. Pt. sustained a fall in december of 2022, and was admitted to the hospital with COVID-19, and Chronic back pain. He reports chronic back pain syndrome since 2012. Since the most recent discharge, Pt. has been residing with the ex-wife until the Pt. is  ready to return to independent living. He did recieve 2 weeks of home health after discharge in December 2022. He is now being referred to outpatient PT to address weakness from stroke and improve fine motor movement. He reports rarely getting numbness/tingling in RLE. PMHx includes: Bilateral LBP with right sided sciatica, Lumbosacral spondylosis myelopathy, closed compression Fx of L1. Has chronic insomnia. osteoarthritis, Panic anxiety syndrome (taking medication) Pt. enjoys cooking, and riding motorcycles. Patient is going to [facility] spine center for Epidural spinal injections on 06/07/21;  PRECAUTIONS: Fall, Spinal Brace wears daily, approximately 25%, when exercising; semi-rigid lumbar brace  SUBJECTIVE: Pt states he is doing "excellent." He rates his pain as 1/10. He reports he feels balance has improved going up/down stairs and that he is getting faster without stumbling.   PAIN:  Are you having pain? Yes: NPRS scale: 2/10 Pain location: bilat low back Pain description: dull ache; always constant,  Aggravating factors: worse with sitting,  Relieving factors: heat helps temporarily, pain pills/pain patch   TODAY'S TREATMENT 05/07/22   TherEx:  Heat donned to low back (low back checked prior to use of heat and is WNL, nothing currently applied to back, no adverse reaction to modality reported or observed. Pt  continues to report that heat feels good)  On mat table- LTRs 10x each side Single leg glute bridge + single leg hamstring curl on p.ball 8x each LE - difficulty with RLE coordination, requires brief rest between reps Glute bridge + hamstring curl on p.ball 10x  LTR 4x each side  2# weights donned each LE 90:90 core/deadbug progression, with TrA activation and pelvic tilt 10x 10 sec hold/rep.  ---2# weights donned each LE - Dead bug with one leg 90:90 position and other leg in full knee ext tapping and raising from table 15x alt LE. Reports difficulty with coordination,  intermittent brief rest breaks between reps  Side-lying hip abduction  with 7# weights 18x each LE.Rates medium. Min hands-on assist for RLE coordination    NMR:  Gait belt donned throughout - performed at support surface  On airex beam: Ankle rockers 3x10 B Tandem gait 2x length of beam  Side stepping 4x length of beam Cross-over stepping 6x length of beam each direction Tandem stance (static) with horizontal head turns 2x10 head turns in each LE position SLB 2x30 sec each  Intermittent UE support and CGA-min a throughout for the above balance interventions   No increased pain with interventions  PATIENT EDUCATION:  Education details: Pt educated throughout session about proper posture and technique with exercises. Improved exercise technique, movement at target joints, use of target muscles after min to mod verbal, visual, tactile cues.  Person educated: Patient Education method: Explanation, Demonstration, Tactile cues, and Verbal cues Education comprehension: verbalized understanding, returned demonstration, verbal cues required, tactile cues required, and needs further education   HOME EXERCISE PROGRAM: No updates on this date pt to continue HEP as previously given     PT Short Term Goals - TARGET DATE .05/28/2022         PT SHORT TERM GOAL #1  Title Patient will be adherent to HEP at least 3x a week to improve functional strength and balance for better safety at home.    Baseline 4/4: doing them 1-2x a week; 08/20/2021=Patient reports performing his low back stretching and no questions currently HEP. 10/24/21: doing HEP 4x a week   Time 4    Period Weeks    Status Achieved    Target Date 08/21/21      PT SHORT TERM GOAL #2   Title Patient (< 60 years old) will complete five times sit to stand test in < 15 seconds indicating an increased LE strength and improved balance.    Baseline 4/4: 17.85 sec with arms across chest; 08/20/2021= 17.6 sec , 7/5: 12.85 sec   Time 4     Period Weeks    Status Achieved;    Target Date 10/01/21              PT Long Term Goals -  TARGET DATE FOR GOALS 06/25/2022        PT LONG TERM GOAL #1   Title Patient will increase six minute walk test distance to >1000 for progression to community ambulator and improve gait ability    Baseline 4/4: 1115 feet , 7/5: 1080 feet;    Time 4    Period Weeks    Status Achieved    Target Date 08/21/21      PT LONG TERM GOAL #2   Title Patient will ascend/descend 8 stairs without rail assist independently without loss of balance to improve ability to get in/out of home.    Baseline 4/4: requires 1 rail assist; 08/20/2021- Patient demonstrated steps without railing using reciprocal steps today. 7/5: ascend/descend 6 steps reciprocally without rail, but required CGA for safety when descending; 7/25: Able to complete without UE support and good balance going up steps, unsafe descending steps without UE support. Required min assist and UE support to prevent LOB descending.; 8/15: requires UUE support to descend and close CGA due to; unsteadiness; 9/26: ascend without UE support, recip pattern; descend with UUE support, recip stepping, but still doesn't feel steady yet with descending; 10/13 able to demo recip pattern ascending hands free, required instance of UE support with descending, recip pattern due to heel getting caught under step; 11/14: deferred; 11/21: mod assist with descending due to unsteadiness, able to complete without UE support ascending; 12/21: Pt reports walking down about 24 stairs without BUE support, reports he did misstep partially but recovered his balance,    Time 12*corrected   Period Weeks    Status Partially met   Target Date      PT LONG TERM GOAL #3   Title Patient will increase BLE gross strength to 4+/5 as to improve functional strength for independent gait, increased standing tolerance and increased ADL ability.    Baseline 4/4: not formally assessed; 08/20/2021=  4+/5 with Right hip flex/knee ext/flex; Right hip abd= 4/5, 7/5: 4+/5 hip    Time 12    Period Weeks    Status Achieved;    Target Date 11/12/21      PT LONG TERM GOAL #4   Title Patient will improve FOTO score to >50% to indicate improved functional mobility with less pain with ADLs.    Baseline 4/4: 35%; 08/20/2021= 42% 7/5: 47%; 7:25:recently addressed; 11/30/21: 35; 9/26: 54%    Time 12    Period Weeks    Status MET   Target Date      PT LONG  TERM GOAL #5   Title Patient will report a worst pain of 4/10 in low back over last week to indicate improved tolerance with ADLs.    Baseline 4/4: 7/10; 08/20/2021= 4/10 , 7/5: worst 7/10 in low back; 7/25: 6/10 ; 8/15: pt reports 6/10; 9/26: 4/10; 04/30/22: pt reports worst pain is 4/10   Time 12    Period Weeks    Status Achieved    Target Date 02/05/2022      Additional Long Term Goals   Additional Long Term Goals Yes      PT LONG TERM GOAL #6   Title Pt will increase by at least 0.13 m/s in order to demonstrate clinically significant improvement in community ambulation    Baseline 08/20/2021=0.94 m/s 7/5: 1.5 m/s   Time 12    Period Weeks    Status Achieved;    Target Date 11/12/21      PT LONG TERM GOAL #7   Title Patient will demonstrate improved static standing balance as seen by ability to single leg stance on right LE > 12 sec consistently for optimal balance on level and unlevel surfaces.    Baseline 08/20/2021= 4 sec SLS on right , 7/5: 4-5 sec inconsistent; 7/25 able to achieve 15 sec after multiple attempts of 4-5 sec holds; 8/15: LLE 15 sec, RLE 8 sec; 9/26: LLE 30 sec, RLE 17 seconds   Time 12    Period Weeks    Status MET   Target Date 02/05/2022    PT LONG TERM GOAL #8  Title Pt will report ability to ambulate for at least 15 minutes without an increase in his LBP in order to improve community participation and ease with ADLs  Baseline 9/26: increase in pain at 15 min mark; 10/13: reports about 20 minutes.   Time 3    Period Weeks   Status MET  Target Date 02/05/2022        PT LONG TERM GOAL #9  Title Pt will report ability to ambulate for at least 45 minutes without an increase in his LBP in order to improve community participation and ease with ADLs  Baseline 9/26: increase in pain at 15 min mark; 10/13: reports about 20 minutes. 11/14: deferred; 11/21: Unable to complete 45 min gait without pain, but does report this has improved. Thinks he can do 30 min before pain increase.; 12/21: Pt unsure currently due to recent change in pain medication dose; 04/30/2022: pt reports he has not walked as much lately, is unsure  Time 3   Period Weeks   Status ONGOING  Target Date        Plan -     Clinical Impression Statement Pt with improved LBP today, back down to 1/10. He continues to tolerate more advanced LE and core strengthening interventions without increase in pain, but does exhibit mild fatigue. Pt also able to perform static and dynamic compliant surface training with CGA-min assist and intermittent UE support throughout. The pt will continue to benefit from skilled physical therapy intervention to address impairments, improve QOL, and attain therapy goals.     Personal Factors and Comorbidities Comorbidity 3+;Past/Current Experience;Time since onset of injury/illness/exacerbation    Comorbidities PMHx includes: Bilateral LBP with right sided sciatica, Lumbosacral spondylosis myelopathy, closed compression Fx of L1. Has chronic insomnia. osteoarthritis, Panic anxiety syndrome (taking medication)    Examination-Activity Limitations Bend;Carry;Locomotion Level;Sit;Squat;Stairs;Stand;Transfers    Examination-Participation Restrictions Cleaning;Community Activity;Driving;Laundry;Meal Prep;Occupation;Shop;Volunteer;Yard Work    Stability/Clinical Decision Making Stable/Uncomplicated  Rehab Potential Good    PT Frequency 1x / week    PT Duration 8 weeks    PT Treatment/Interventions Cryotherapy;Electrical  Stimulation;Moist Heat;Traction;Gait training;Stair training;Functional mobility training;Therapeutic activities;Therapeutic exercise;Balance training;Neuromuscular re-education;Patient/family education;Manual techniques;Passive range of motion;Dry needling;Energy conservation    PT Next Visit Plan  Progress core stabilization, Manual therapy for low back and LE ROM, instruct in balance HEP, continue plan       Consulted and Agree with Plan of Care Patient              1:20 PM, 05/07/22 Baird Kay PT    Physical Therapist - [facility]  Outpatient Physical Therapy- Main Campus [phone]     [facility] MAIN [facility] SERVICES [address] [place], [place], [address] Phone: [phone]   Fax:  [phone]

## 2022-05-09 ENCOUNTER — Ambulatory Visit: Payer: 59

## 2022-05-14 ENCOUNTER — Ambulatory Visit: Payer: 59

## 2022-05-14 DIAGNOSIS — M545 Low back pain, unspecified: Secondary | ICD-10-CM

## 2022-05-14 DIAGNOSIS — R278 Other lack of coordination: Secondary | ICD-10-CM

## 2022-05-14 DIAGNOSIS — R262 Difficulty in walking, not elsewhere classified: Secondary | ICD-10-CM | POA: Diagnosis not present

## 2022-05-14 DIAGNOSIS — G8929 Other chronic pain: Secondary | ICD-10-CM

## 2022-05-14 DIAGNOSIS — R2681 Unsteadiness on feet: Secondary | ICD-10-CM | POA: Diagnosis not present

## 2022-05-14 DIAGNOSIS — M6281 Muscle weakness (generalized): Secondary | ICD-10-CM | POA: Diagnosis not present

## 2022-05-14 NOTE — Therapy (Signed)
[facility] at [facility] [place], [place], [address] Phone: [phone]   Fax:  [phone]  Patient Details  Name: David Reeves MRN: [id number] Date of Birth: July 18, 1970 Referring Provider:  [name], PA-C  Encounter Date: 05/14/2022   OUTPATIENT PHYSICAL THERAPY TREATMENT NOTE      Patient Name: David Reeves MRN: [id number] DOB:08-Jan-1971, 52 y.o., male Today's Date: 05/14/2022  PCP: [name], PA-C REFERRING PROVIDER: [name], PA-C   PT End of Session - 05/14/22 1454     Visit Number 81    Number of Visits 24   corrected number to reflect latest recert from 05/27/3662   Date for PT Re-Evaluation 06/25/22    Authorization Type [employer]    Authorization Time Period 02/01/22-04/26/22    Progress Note Due on Visit 17    PT Start Time 0929    PT Stop Time 1014    PT Time Calculation (min) 45 min    Equipment Utilized During Treatment Gait belt    Activity Tolerance Patient tolerated treatment well;No increased pain    Behavior During Therapy [facility] for tasks assessed/performed                 Past Medical History:  Diagnosis Date   Back pain 04/22/2012   Bone spur    Bulging disc 04/22/2012   Degenerative disc disease    Osteoarthritis    Panic anxiety syndrome    Stroke ([place]) 10/2020   Taking multiple medications for chronic disease    Past Surgical History:  Procedure Laterality Date   CYSTECTOMY     head   HERNIA REPAIR Left 2006,2014   [facility]   TOE SURGERY Right 2007   Patient Active Problem List   Diagnosis Date Noted   Alterations of sensations following cerebrovascular accident 03/08/2021   Aphasia as late effect of cerebrovascular accident (CVA) 03/08/2021   Muscle spasm 02/22/2021   Acute non-recurrent frontal sinusitis 02/22/2021   Chronic pain syndrome 01/23/2021   Primary hypertension 01/23/2021   Nerve pain 01/23/2021   Erectile  dysfunction due to diseases classified elsewhere 01/23/2021   Cognitive dysfunction 01/23/2021   Scrotal edema 01/23/2021   Elevated LDL cholesterol level 01/23/2021   History of stroke with residual deficit 01/23/2021   Primary insomnia 01/23/2021   Mouth pain 01/23/2021   Acute ischemic stroke ([place]) 11/10/2020   Closed compression fracture of L1 vertebra ([place]) 05/24/2016   Lumbar stenosis with neurogenic claudication 11/01/2015   Chronic bilateral low back pain with right-sided sciatica 11/08/2014   Hx of hemorrhoids 11/08/2014   AAA (abdominal aortic aneurysm) ([place]) 09/22/2014   Chronic pain associated with significant psychosocial dysfunction 09/22/2014   Panic disorder 09/22/2014   AB (asthmatic bronchitis) 08/17/2014   Anxiety disorder due to general medical condition 08/17/2014   Backache 08/17/2014   Lumbosacral spondylosis without myelopathy 08/17/2014   Disorder of male genital organs 08/17/2014   Brash 08/17/2014   Low back pain 08/17/2014   Tendon nodule 08/17/2014   Episodic paroxysmal anxiety disorder 08/17/2014   Hernia, inguinal, right 08/17/2014   Fast heart beat 08/17/2014   Inguinal hernia 10/14/2012    REFERRING DIAG: History of stroke with residual deficit, chronic bilateral low back pain with right-sided sciatica, Lumbosacral spondylosis without myelopathy THERAPY DIAG:  Chronic bilateral low back pain without sciatica  Muscle weakness (generalized)  Other lack of coordination  Rationale for Evaluation and Treatment Rehabilitation  PERTINENT HISTORY: Pt. is a 52 y.o.  who was diagnosed with a CVA on July 21st, 2022. Pt. completed several weeks of inpatient rehab at [facility]. After returning to home. He was discharged in late August/early September and recieved home health PT. Pt. sustained a fall in december of 2022, and was admitted to the hospital with COVID-19, and Chronic back pain. He reports chronic back pain syndrome since 2012. Since the most recent  discharge, Pt. has been residing with the ex-wife until the Pt. is ready to return to independent living. He did recieve 2 weeks of home health after discharge in December 2022. He is now being referred to outpatient PT to address weakness from stroke and improve fine motor movement. He reports rarely getting numbness/tingling in RLE. PMHx includes: Bilateral LBP with right sided sciatica, Lumbosacral spondylosis myelopathy, closed compression Fx of L1. Has chronic insomnia. osteoarthritis, Panic anxiety syndrome (taking medication) Pt. enjoys cooking, and riding motorcycles. Patient is going to [facility] spine center for Epidural spinal injections on 06/07/21;  PRECAUTIONS: Fall, Spinal Brace wears daily, approximately 25%, when exercising; semi-rigid lumbar brace  SUBJECTIVE: Pt states he has been "hurting like hell" the past few days. Pain is currently a 3/10, was a 4/10 earlier this morning. Pt reports both knees and low back have increasingly hurt with the cold weather. Pt reports he has not been sleeping too well.     PAIN:  Are you having pain? Yes: NPRS scale: 3/10 Pain location: bilat low back Pain description: dull ache; always constant,  Aggravating factors: worse with sitting,  Relieving factors: heat helps temporarily, pain pills/pain patch   TODAY'S TREATMENT 05/14/22   TherEx:  On mat table- LTRs 20x each side Green p.ball hamstring curl 20x B. Pt reports intervention improves his pain Knee to chest march 20x alt LE Glute Bridge 20x P.ball adductor squeezes 1x10, 1x8, 1x5 with 3 sec hold/rep  LTR 10x each side Open book 10x each side  Clamshells 1x15  each LE Side-lye hip abduction 15x each LE Glute bridge 20x B LTR 10x alt sides  Stability ball rollouts FWD/BCKWD/LTL with PT assisting pt into stretch with LTL rollouts (held for 10-15 sec) x multiple reps STS 10x, 8x  Pt reports pain decreased to 2/10 by end of session.  PATIENT EDUCATION:  Education details: Pt educated  throughout session about proper posture and technique with exercises. Improved exercise technique, movement at target joints, use of target muscles after min to mod verbal, visual, tactile cues.  Person educated: Patient Education method: Explanation, Demonstration, Tactile cues, and Verbal cues Education comprehension: verbalized understanding, returned demonstration, verbal cues required, tactile cues required, and needs further education   HOME EXERCISE PROGRAM: No updates on this date pt to continue HEP as previously given     PT Short Term Goals - TARGET DATE .05/28/2022         PT SHORT TERM GOAL #1   Title Patient will be adherent to HEP at least 3x a week to improve functional strength and balance for better safety at home.    Baseline 4/4: doing them 1-2x a week; 08/20/2021=Patient reports performing his low back stretching and no questions currently HEP. 10/24/21: doing HEP 4x a week   Time 4    Period Weeks    Status Achieved    Target Date 08/21/21      PT SHORT TERM GOAL #2   Title Patient (< 44 years old) will complete five times sit to stand test in < 15 seconds indicating an increased LE strength and  improved balance.    Baseline 4/4: 17.85 sec with arms across chest; 08/20/2021= 17.6 sec , 7/5: 12.85 sec   Time 4    Period Weeks    Status Achieved;    Target Date 10/01/21              PT Long Term Goals -  TARGET DATE FOR GOALS 06/25/2022        PT LONG TERM GOAL #1   Title Patient will increase six minute walk test distance to >1000 for progression to community ambulator and improve gait ability    Baseline 4/4: 1115 feet , 7/5: 1080 feet;    Time 4    Period Weeks    Status Achieved    Target Date 08/21/21      PT LONG TERM GOAL #2   Title Patient will ascend/descend 8 stairs without rail assist independently without loss of balance to improve ability to get in/out of home.    Baseline 4/4: requires 1 rail assist; 08/20/2021- Patient demonstrated steps  without railing using reciprocal steps today. 7/5: ascend/descend 6 steps reciprocally without rail, but required CGA for safety when descending; 7/25: Able to complete without UE support and good balance going up steps, unsafe descending steps without UE support. Required min assist and UE support to prevent LOB descending.; 8/15: requires UUE support to descend and close CGA due to; unsteadiness; 9/26: ascend without UE support, recip pattern; descend with UUE support, recip stepping, but still doesn't feel steady yet with descending; 10/13 able to demo recip pattern ascending hands free, required instance of UE support with descending, recip pattern due to heel getting caught under step; 11/14: deferred; 11/21: mod assist with descending due to unsteadiness, able to complete without UE support ascending; 12/21: Pt reports walking down about 24 stairs without BUE support, reports he did misstep partially but recovered his balance,    Time 12*corrected   Period Weeks    Status Partially met   Target Date      PT LONG TERM GOAL #3   Title Patient will increase BLE gross strength to 4+/5 as to improve functional strength for independent gait, increased standing tolerance and increased ADL ability.    Baseline 4/4: not formally assessed; 08/20/2021= 4+/5 with Right hip flex/knee ext/flex; Right hip abd= 4/5, 7/5: 4+/5 hip    Time 12    Period Weeks    Status Achieved;    Target Date 11/12/21      PT LONG TERM GOAL #4   Title Patient will improve FOTO score to >50% to indicate improved functional mobility with less pain with ADLs.    Baseline 4/4: 35%; 08/20/2021= 42% 7/5: 47%; 7:25:recently addressed; 11/30/21: 35; 9/26: 54%    Time 12    Period Weeks    Status MET   Target Date      PT LONG TERM GOAL #5   Title Patient will report a worst pain of 4/10 in low back over last week to indicate improved tolerance with ADLs.    Baseline 4/4: 7/10; 08/20/2021= 4/10 , 7/5: worst 7/10 in low back; 7/25: 6/10  ; 8/15: pt reports 6/10; 9/26: 4/10; 04/30/22: pt reports worst pain is 4/10   Time 12    Period Weeks    Status Achieved    Target Date 02/05/2022      Additional Long Term Goals   Additional Long Term Goals Yes      PT LONG TERM GOAL #6   Title Pt  will increase by at least 0.13 m/s in order to demonstrate clinically significant improvement in community ambulation    Baseline 08/20/2021=0.94 m/s 7/5: 1.5 m/s   Time 12    Period Weeks    Status Achieved;    Target Date 11/12/21      PT LONG TERM GOAL #7   Title Patient will demonstrate improved static standing balance as seen by ability to single leg stance on right LE > 12 sec consistently for optimal balance on level and unlevel surfaces.    Baseline 08/20/2021= 4 sec SLS on right , 7/5: 4-5 sec inconsistent; 7/25 able to achieve 15 sec after multiple attempts of 4-5 sec holds; 8/15: LLE 15 sec, RLE 8 sec; 9/26: LLE 30 sec, RLE 17 seconds   Time 12    Period Weeks    Status MET   Target Date 02/05/2022    PT LONG TERM GOAL #8  Title Pt will report ability to ambulate for at least 15 minutes without an increase in his LBP in order to improve community participation and ease with ADLs  Baseline 9/26: increase in pain at 15 min mark; 10/13: reports about 20 minutes.   Time 3   Period Weeks   Status MET  Target Date 02/05/2022        PT LONG TERM GOAL #9  Title Pt will report ability to ambulate for at least 45 minutes without an increase in his LBP in order to improve community participation and ease with ADLs  Baseline 9/26: increase in pain at 15 min mark; 10/13: reports about 20 minutes. 11/14: deferred; 11/21: Unable to complete 45 min gait without pain, but does report this has improved. Thinks he can do 30 min before pain increase.; 12/21: Pt unsure currently due to recent change in pain medication dose; 04/30/2022: pt reports he has not walked as much lately, is unsure  Time 3   Period Weeks   Status ONGOING  Target Date         Plan -     Clinical Impression Statement Pt presents to session with increased LBP today (rates 3/10) with reports of poor sleep and worsening symptoms with extremely cold weather. Interventions regressed as a result and majority performed on mat table. Pt did report decrease in pain to 2/10 by end of session following interventions. The pt will continue to benefit from skilled physical therapy intervention to address impairments, improve QOL, and attain therapy goals.     Personal Factors and Comorbidities Comorbidity 3+;Past/Current Experience;Time since onset of injury/illness/exacerbation    Comorbidities PMHx includes: Bilateral LBP with right sided sciatica, Lumbosacral spondylosis myelopathy, closed compression Fx of L1. Has chronic insomnia. osteoarthritis, Panic anxiety syndrome (taking medication)    Examination-Activity Limitations Bend;Carry;Locomotion Level;Sit;Squat;Stairs;Stand;Transfers    Examination-Participation Restrictions Cleaning;Community Activity;Driving;Laundry;Meal Prep;Occupation;Shop;Volunteer;Yard Work    Stability/Clinical Decision Making Stable/Uncomplicated    Rehab Potential Good    PT Frequency 1x / week    PT Duration 8 weeks    PT Treatment/Interventions Cryotherapy;Electrical Stimulation;Moist Heat;Traction;Gait training;Stair training;Functional mobility training;Therapeutic activities;Therapeutic exercise;Balance training;Neuromuscular re-education;Patient/family education;Manual techniques;Passive range of motion;Dry needling;Energy conservation    PT Next Visit Plan  Progress core stabilization, Manual therapy for low back and LE ROM, instruct in balance HEP, continue plan       Consulted and Agree with Plan of Care Patient              3:03 PM, 05/14/22 Baird Kay PT    Physical Therapist - [facility]  Outpatient Physical Therapy- Main Campus [phone]     [facility]  Outpatient Rehabilitation at [facility] [address] [place], [place], [address] Phone: [phone]   Fax:  [phone]

## 2022-05-16 ENCOUNTER — Ambulatory Visit: Payer: 59

## 2022-05-17 ENCOUNTER — Telehealth: Payer: Self-pay | Admitting: Physician Assistant

## 2022-05-21 ENCOUNTER — Ambulatory Visit: Payer: 59

## 2022-05-21 DIAGNOSIS — R262 Difficulty in walking, not elsewhere classified: Secondary | ICD-10-CM

## 2022-05-21 DIAGNOSIS — M6281 Muscle weakness (generalized): Secondary | ICD-10-CM | POA: Diagnosis not present

## 2022-05-21 DIAGNOSIS — M545 Low back pain, unspecified: Secondary | ICD-10-CM | POA: Diagnosis not present

## 2022-05-21 DIAGNOSIS — G8929 Other chronic pain: Secondary | ICD-10-CM

## 2022-05-21 DIAGNOSIS — R278 Other lack of coordination: Secondary | ICD-10-CM | POA: Diagnosis not present

## 2022-05-21 DIAGNOSIS — R2681 Unsteadiness on feet: Secondary | ICD-10-CM | POA: Diagnosis not present

## 2022-05-21 NOTE — Therapy (Signed)
[place] [place] [facility] [place], [place], [address] Phone: [phone]   Fax:  [phone]  Patient Details  Name: David Reeves MRN: [id number] Date of Birth: 1971-03-22 Referring Provider:  [name], PA-C  Encounter Date: 05/21/2022   OUTPATIENT PHYSICAL THERAPY TREATMENT NOTE      Patient Name: David Reeves MRN: [id number] DOB:Aug 13, 1970, 52 y.o., male Today's Date: 05/21/2022  PCP: [name], PA-C REFERRING PROVIDER: [name], PA-C   PT End of Session - 05/21/22 0935     Visit Number 44    Number of Visits 83   corrected number to reflect latest recert from 0/10/3708   Date for PT Re-Evaluation 06/25/22    Authorization Type [employer]    Authorization Time Period 02/01/22-04/26/22    Progress Note Due on Visit 37    PT Start Time 0932    PT Stop Time 1013    PT Time Calculation (min) 41 min    Equipment Utilized During Treatment Gait belt    Activity Tolerance Patient tolerated treatment well;No increased pain    Behavior During Therapy [facility] for tasks assessed/performed                 Past Medical History:  Diagnosis Date   Back pain 04/22/2012   Bone spur    Bulging disc 04/22/2012   Degenerative disc disease    Osteoarthritis    Panic anxiety syndrome    Stroke ([place]) 10/2020   Taking multiple medications for chronic disease    Past Surgical History:  Procedure Laterality Date   CYSTECTOMY     head   HERNIA REPAIR Left 2006,2014   [facility]   TOE SURGERY Right 2007   Patient Active Problem List   Diagnosis Date Noted   Alterations of sensations following cerebrovascular accident 03/08/2021   Aphasia as late effect of cerebrovascular accident (CVA) 03/08/2021   Muscle spasm 02/22/2021   Acute non-recurrent frontal sinusitis 02/22/2021   Chronic pain syndrome 01/23/2021   Primary hypertension 01/23/2021   Nerve pain 01/23/2021   Erectile  dysfunction due to diseases classified elsewhere 01/23/2021   Cognitive dysfunction 01/23/2021   Scrotal edema 01/23/2021   Elevated LDL cholesterol level 01/23/2021   History of stroke with residual deficit 01/23/2021   Primary insomnia 01/23/2021   Mouth pain 01/23/2021   Acute ischemic stroke ([place]) 11/10/2020   Closed compression fracture of L1 vertebra ([place]) 05/24/2016   Lumbar stenosis with neurogenic claudication 11/01/2015   Chronic bilateral low back pain with right-sided sciatica 11/08/2014   Hx of hemorrhoids 11/08/2014   AAA (abdominal aortic aneurysm) ([place]) 09/22/2014   Chronic pain associated with significant psychosocial dysfunction 09/22/2014   Panic disorder 09/22/2014   AB (asthmatic bronchitis) 08/17/2014   Anxiety disorder due to general medical condition 08/17/2014   Backache 08/17/2014   Lumbosacral spondylosis without myelopathy 08/17/2014   Disorder of male genital organs 08/17/2014   Brash 08/17/2014   Low back pain 08/17/2014   Tendon nodule 08/17/2014   Episodic paroxysmal anxiety disorder 08/17/2014   Hernia, inguinal, right 08/17/2014   Fast heart beat 08/17/2014   Inguinal hernia 10/14/2012    REFERRING DIAG: History of stroke with residual deficit, chronic bilateral low back pain with right-sided sciatica, Lumbosacral spondylosis without myelopathy THERAPY DIAG:  Chronic bilateral low back pain without sciatica  Unsteadiness on feet  Muscle weakness (generalized)  Difficulty in walking, not elsewhere classified  Other lack of coordination  Rationale for Evaluation  and Treatment Rehabilitation  PERTINENT HISTORY: Pt. is a 52 y.o. who was diagnosed with a CVA on July 21st, 2022. Pt. completed several weeks of inpatient rehab at [facility]. After returning to home. He was discharged in late August/early September and recieved home health PT. Pt. sustained a fall in december of 2022, and was admitted to the hospital with COVID-19, and Chronic back pain.  He reports chronic back pain syndrome since 2012. Since the most recent discharge, Pt. has been residing with the ex-wife until the Pt. is ready to return to independent living. He did recieve 2 weeks of home health after discharge in December 2022. He is now being referred to outpatient PT to address weakness from stroke and improve fine motor movement. He reports rarely getting numbness/tingling in RLE. PMHx includes: Bilateral LBP with right sided sciatica, Lumbosacral spondylosis myelopathy, closed compression Fx of L1. Has chronic insomnia. osteoarthritis, Panic anxiety syndrome (taking medication) Pt. enjoys cooking, and riding motorcycles. Patient is going to [facility] spine center for Epidural spinal injections on 06/07/21;  PRECAUTIONS: Fall, Spinal Brace wears daily, approximately 25%, when exercising; semi-rigid lumbar brace  SUBJECTIVE: Pt reports pain level is currently 2/10, has fresh patch on. He reports no other updates, no stumbles/falls.  Plans to restart OT soon.   PAIN:  Are you having pain? Yes: NPRS scale: 3/10 Pain location: bilat low back Pain description: dull ache; always constant,  Aggravating factors: worse with sitting,  Relieving factors: heat helps temporarily, pain pills/pain patch   TODAY'S TREATMENT 05/21/22  Gait belt donned and CGA provided throughout unless specified otherwise  NMR: Standing semi-tandem with horizontal head turns 2x30 sec each LE. Intermittent UE support  On airex: EC 4x30 sec. Initially with increased sway, improves with reps  Heel-toe raises 20x bilat. Up to min assist provided  Alt LE march 2x10. Fatiguing  TherEx:  Stability ball rollouts FWD/BCKWD/LTL with PT assisting pt into stretch with LTL rollouts (held for 5 sec) x multiple reps On mat table: LTRs 20x each side Green p.ball hamstring curl 20x B.  Glute Bridge 20x bilat Single leg glute bridge 6x each LE SLR 25x each LE  Pt reports pain remains at 2/10 by end of  session.  PATIENT EDUCATION:  Education details: Pt educated throughout session about proper posture and technique with exercises. Improved exercise technique, movement at target joints, use of target muscles after min to mod verbal, visual, tactile cues.  Person educated: Patient Education method: Explanation, Demonstration, Tactile cues, and Verbal cues Education comprehension: verbalized understanding, returned demonstration, verbal cues required, tactile cues required, and needs further education   HOME EXERCISE PROGRAM: No updates on this date pt to continue HEP as previously given     PT Short Term Goals - TARGET DATE .05/28/2022         PT SHORT TERM GOAL #1   Title Patient will be adherent to HEP at least 3x a week to improve functional strength and balance for better safety at home.    Baseline 4/4: doing them 1-2x a week; 08/20/2021=Patient reports performing his low back stretching and no questions currently HEP. 10/24/21: doing HEP 4x a week   Time 4    Period Weeks    Status Achieved    Target Date 08/21/21      PT SHORT TERM GOAL #2   Title Patient (< 70 years old) will complete five times sit to stand test in < 15 seconds indicating an increased LE strength and improved balance.  Baseline 4/4: 17.85 sec with arms across chest; 08/20/2021= 17.6 sec , 7/5: 12.85 sec   Time 4    Period Weeks    Status Achieved;    Target Date 10/01/21              PT Long Term Goals -  TARGET DATE FOR GOALS 06/25/2022        PT LONG TERM GOAL #1   Title Patient will increase six minute walk test distance to >1000 for progression to community ambulator and improve gait ability    Baseline 4/4: 1115 feet , 7/5: 1080 feet;    Time 4    Period Weeks    Status Achieved    Target Date 08/21/21      PT LONG TERM GOAL #2   Title Patient will ascend/descend 8 stairs without rail assist independently without loss of balance to improve ability to get in/out of home.    Baseline 4/4:  requires 1 rail assist; 08/20/2021- Patient demonstrated steps without railing using reciprocal steps today. 7/5: ascend/descend 6 steps reciprocally without rail, but required CGA for safety when descending; 7/25: Able to complete without UE support and good balance going up steps, unsafe descending steps without UE support. Required min assist and UE support to prevent LOB descending.; 8/15: requires UUE support to descend and close CGA due to; unsteadiness; 9/26: ascend without UE support, recip pattern; descend with UUE support, recip stepping, but still doesn't feel steady yet with descending; 10/13 able to demo recip pattern ascending hands free, required instance of UE support with descending, recip pattern due to heel getting caught under step; 11/14: deferred; 11/21: mod assist with descending due to unsteadiness, able to complete without UE support ascending; 12/21: Pt reports walking down about 24 stairs without BUE support, reports he did misstep partially but recovered his balance,    Time 12*corrected   Period Weeks    Status Partially met   Target Date      PT LONG TERM GOAL #3   Title Patient will increase BLE gross strength to 4+/5 as to improve functional strength for independent gait, increased standing tolerance and increased ADL ability.    Baseline 4/4: not formally assessed; 08/20/2021= 4+/5 with Right hip flex/knee ext/flex; Right hip abd= 4/5, 7/5: 4+/5 hip    Time 12    Period Weeks    Status Achieved;    Target Date 11/12/21      PT LONG TERM GOAL #4   Title Patient will improve FOTO score to >50% to indicate improved functional mobility with less pain with ADLs.    Baseline 4/4: 35%; 08/20/2021= 42% 7/5: 47%; 7:25:recently addressed; 11/30/21: 35; 9/26: 54%    Time 12    Period Weeks    Status MET   Target Date      PT LONG TERM GOAL #5   Title Patient will report a worst pain of 4/10 in low back over last week to indicate improved tolerance with ADLs.    Baseline 4/4:  7/10; 08/20/2021= 4/10 , 7/5: worst 7/10 in low back; 7/25: 6/10 ; 8/15: pt reports 6/10; 9/26: 4/10; 04/30/22: pt reports worst pain is 4/10   Time 12    Period Weeks    Status Achieved    Target Date 02/05/2022      Additional Long Term Goals   Additional Long Term Goals Yes      PT LONG TERM GOAL #6   Title Pt will increase by at  least 0.13 m/s in order to demonstrate clinically significant improvement in community ambulation    Baseline 08/20/2021=0.94 m/s 7/5: 1.5 m/s   Time 12    Period Weeks    Status Achieved;    Target Date 11/12/21      PT LONG TERM GOAL #7   Title Patient will demonstrate improved static standing balance as seen by ability to single leg stance on right LE > 12 sec consistently for optimal balance on level and unlevel surfaces.    Baseline 08/20/2021= 4 sec SLS on right , 7/5: 4-5 sec inconsistent; 7/25 able to achieve 15 sec after multiple attempts of 4-5 sec holds; 8/15: LLE 15 sec, RLE 8 sec; 9/26: LLE 30 sec, RLE 17 seconds   Time 12    Period Weeks    Status MET   Target Date 02/05/2022    PT LONG TERM GOAL #8  Title Pt will report ability to ambulate for at least 15 minutes without an increase in his LBP in order to improve community participation and ease with ADLs  Baseline 9/26: increase in pain at 15 min mark; 10/13: reports about 20 minutes.   Time 3   Period Weeks   Status MET  Target Date 02/05/2022        PT LONG TERM GOAL #9  Title Pt will report ability to ambulate for at least 45 minutes without an increase in his LBP in order to improve community participation and ease with ADLs  Baseline 9/26: increase in pain at 15 min mark; 10/13: reports about 20 minutes. 11/14: deferred; 11/21: Unable to complete 45 min gait without pain, but does report this has improved. Thinks he can do 30 min before pain increase.; 12/21: Pt unsure currently due to recent change in pain medication dose; 04/30/2022: pt reports he has not walked as much lately, is  unsure  Time 3   Period Weeks   Status ONGOING  Target Date        Plan -     Clinical Impression Statement Pt continues to present with slight pain increase (2/10). First part of session focused on balance training, where pt required intermittent UE support and CGA-min a throughout. He did show within-session improvement in EC compliant surface training, decreasing sway. Pt LBP did not increase with any interventions performed today and remained at baseline. The pt will continue to benefit from skilled physical therapy intervention to address impairments, improve QOL, and attain therapy goals.     Personal Factors and Comorbidities Comorbidity 3+;Past/Current Experience;Time since onset of injury/illness/exacerbation    Comorbidities PMHx includes: Bilateral LBP with right sided sciatica, Lumbosacral spondylosis myelopathy, closed compression Fx of L1. Has chronic insomnia. osteoarthritis, Panic anxiety syndrome (taking medication)    Examination-Activity Limitations Bend;Carry;Locomotion Level;Sit;Squat;Stairs;Stand;Transfers    Examination-Participation Restrictions Cleaning;Community Activity;Driving;Laundry;Meal Prep;Occupation;Shop;Volunteer;Yard Work    Stability/Clinical Decision Making Stable/Uncomplicated    Rehab Potential Good    PT Frequency 1x / week    PT Duration 8 weeks    PT Treatment/Interventions Cryotherapy;Electrical Stimulation;Moist Heat;Traction;Gait training;Stair training;Functional mobility training;Therapeutic activities;Therapeutic exercise;Balance training;Neuromuscular re-education;Patient/family education;Manual techniques;Passive range of motion;Dry needling;Energy conservation    PT Next Visit Plan  Progress core stabilization, Manual therapy for low back and LE ROM, instruct in balance HEP, continue plan       Consulted and Agree with Plan of Care Patient              3:06 PM, 05/21/22 Baird Kay PT    Physical Therapist -  [facility]  Outpatient Physical Therapy- [facility] [place], [place], [address] Phone: [phone]   Fax:  [phone]

## 2022-05-23 ENCOUNTER — Ambulatory Visit: Payer: 59

## 2022-05-28 ENCOUNTER — Ambulatory Visit: Payer: 59 | Attending: Physician Assistant

## 2022-05-28 ENCOUNTER — Ambulatory Visit: Payer: 59

## 2022-05-28 DIAGNOSIS — M545 Low back pain, unspecified: Secondary | ICD-10-CM | POA: Diagnosis not present

## 2022-05-28 DIAGNOSIS — M6281 Muscle weakness (generalized): Secondary | ICD-10-CM | POA: Insufficient documentation

## 2022-05-28 DIAGNOSIS — R2681 Unsteadiness on feet: Secondary | ICD-10-CM | POA: Insufficient documentation

## 2022-05-28 DIAGNOSIS — R262 Difficulty in walking, not elsewhere classified: Secondary | ICD-10-CM | POA: Diagnosis not present

## 2022-05-28 DIAGNOSIS — G8929 Other chronic pain: Secondary | ICD-10-CM | POA: Insufficient documentation

## 2022-05-28 DIAGNOSIS — R278 Other lack of coordination: Secondary | ICD-10-CM | POA: Diagnosis not present

## 2022-05-28 LAB — COLOGUARD

## 2022-05-28 NOTE — Progress Notes (Unsigned)
I,Sha'taria Tyson,acting as a Education administrator for [employer], PA-C.,have documented all relevant documentation on the behalf of David Kirschner, PA-C,as directed by  David Kirschner, PA-C while in the presence of David Kirschner, PA-C.   Complete physical exam   Patient: David Reeves   DOB: [birth year]   52 y.o. Male  MRN: [id number] Visit Date: 05/29/2022  Today's healthcare provider: [name], PA-C   cc. cpe  Subjective    David Reeves is a 52 y.o. male who presents today for a complete physical exam.  He reports consuming a general diet.  The patient reports doing stretches daily and walking on the treadmill three times a week for 5-7 minutes at a time.  He generally feels well. He reports sleeping fairly well. He does not have additional problems to discuss today.  HPI    Past Medical History:  Diagnosis Date   Back pain 04/22/2012   Bone spur    Bulging disc 04/22/2012   Degenerative disc disease    Osteoarthritis    Panic anxiety syndrome    Stroke [facility]) 10/2020   Taking multiple medications for chronic disease    Past Surgical History:  Procedure Laterality Date   CYSTECTOMY     head   HERNIA REPAIR Left 2006,2014   [facility]   TOE SURGERY Right 2007   Social History   Socioeconomic History   Marital status: Single    Spouse name: mikki    Number of children: 1   Years of education: Not on file   Highest education level: Some college, no degree  Occupational History   Not on file  Tobacco Use   Smoking status: Never   Smokeless tobacco: Never   Tobacco comments:    cbd    Vaping Use   Vaping Use: Never used  Substance and Sexual Activity   Alcohol use: Not Currently    Comment: last used 12-22-17   Drug use: Not Currently    Types: Marijuana    Comment: October 14, 2017   Sexual activity: Not Currently  Other Topics Concern   Not on file  Social History Narrative   Not on file   Social Determinants of Health   Financial Resource  Strain: Low Risk  (05/01/2022)   Overall Financial Resource Strain (CARDIA)    Difficulty of Paying Living Expenses: Not hard at all  Food Insecurity: No Food Insecurity (05/01/2022)   Hunger Vital Sign    Worried About Running Out of Food in the Last Year: Never true    Ran Out of Food in the Last Year: Never true  Transportation Needs: No Transportation Needs (05/01/2022)   PRAPARE - Hydrologist (Medical): No    Lack of Transportation (Non-Medical): No  Physical Activity: Insufficiently Active (05/01/2022)   Exercise Vital Sign    Days of Exercise per Week: 2 days    Minutes of Exercise per Session: 20 min  Stress: No Stress Concern Present (05/01/2022)   [place]    Feeling of Stress : Only a little  Social Connections: Socially Isolated (05/01/2022)   Social Connection and Isolation Panel [NHANES]    Frequency of Communication with Friends and Family: More than three times a week    Frequency of Social Gatherings with Friends and Family: Three times a week    Attends Religious Services: Never    Active Member of Clubs or Organizations: No  Attends Archivist Meetings: Never    Marital Status: Divorced  Human resources officer Violence: Not At Risk (05/01/2022)   Humiliation, Afraid, Rape, and Kick questionnaire    Fear of Current or Ex-Partner: No    Emotionally Abused: No    Physically Abused: No    Sexually Abused: No   Family Status  Relation Name Status   Mother  Deceased   Father  Deceased   Daughter  Alive   Mat Uncle  Alive   MGM  Deceased   MGF  Deceased   Family History  Problem Relation Age of Onset   Diabetes Mother    Diabetes Maternal Uncle    Diabetes Maternal Grandmother    Heart disease Maternal Grandmother    Allergies  Allergen Reactions   Duloxetine     Other reaction(s): Other (See Comments) Increased temperature, sweating, uncontrolled shaking,  aggressive thoughts   Amitriptyline Other (See Comments)    Urine retention   Buspirone Other (See Comments)    Kidney pain   Ciprofloxacin Other (See Comments)    hallucinations   Doxepin Other (See Comments)    "Didn't feel right"   Escitalopram Other (See Comments)    Agitation   Olanzapine    Penicillins Other (See Comments)    Unknown -- childhood reaction   Sertraline Other (See Comments)    Urine retention   Trazodone Other (See Comments)    abd pain, urine retention    Patient Care Team: David Kirschner, PA-C as PCP - General (Physician Assistant) Christene Lye, MD (General Surgery) David Kirschner, PA-C as Physician Assistant (Physician Assistant)   Medications: Outpatient Medications Prior to Visit  Medication Sig   acetaminophen (TYLENOL) 500 MG tablet Take 1,000 mg by mouth every 6 (six) hours as needed for mild pain.   amLODipine (NORVASC) 5 MG tablet TAKE 1 TABLET (5 MG TOTAL) BY MOUTH DAILY.   aspirin EC 81 MG tablet Take 81 mg by mouth daily. Swallow whole.   atorvastatin (LIPITOR) 80 MG tablet TAKE 1 TABLET BY MOUTH EVERY DAY   buprenorphine (BUTRANS) 10 MCG/HR PTWK 1 patch once a week.   celecoxib (CELEBREX) 100 MG capsule TAKE 1 CAPSULE BY MOUTH TWICE  DAILY   Cholecalciferol (VITAMIN D3) 2000 units TABS Take by mouth.   diazepam (VALIUM) 5 MG tablet TAKE 1 TABLET (5 MG TOTAL) BY MOUTH EVERY 12 HOURS AS NEEDED FOR MUSCLE SPASM   donepezil (ARICEPT) 10 MG tablet TAKE 1 TABLET BY MOUTH AT  BEDTIME   gabapentin (NEURONTIN) 400 MG capsule TAKE 1 CAPSULE BY MOUTH 3 TIMES  DAILY   Multiple Vitamin (MULTIVITAMIN) tablet Take 1 tablet by mouth daily.   prazosin (MINIPRESS) 1 MG capsule TAKE 2 CAPSULES BY MOUTH AT  BEDTIME , DO NOT TAKE WITH  AMBIEN   No facility-administered medications prior to visit.    Review of Systems  HENT:  Positive for rhinorrhea.   Genitourinary:  Positive for frequency.  Musculoskeletal:  Positive for back pain and myalgias.     Objective    Blood pressure 124/82, pulse 69, height 6\' 1"  (1.854 m), weight 222 lb 8 oz (100.9 kg), SpO2 96 %.    Physical Exam Constitutional:      General: He is awake.     Appearance: He is well-developed.  HENT:     Head: Normocephalic.     Right Ear: Tympanic membrane, ear canal and external ear normal.     Left Ear: Tympanic membrane, ear canal  and external ear normal.     Nose: Nose normal. No congestion or rhinorrhea.     Mouth/Throat:     Mouth: Mucous membranes are moist.     Pharynx: No oropharyngeal exudate or posterior oropharyngeal erythema.  Eyes:     Pupils: Pupils are equal, round, and reactive to light.  Cardiovascular:     Rate and Rhythm: Normal rate and regular rhythm.     Heart sounds: Normal heart sounds.  Pulmonary:     Effort: Pulmonary effort is normal.     Breath sounds: Normal breath sounds.  Abdominal:     General: There is no distension.     Palpations: Abdomen is soft.     Tenderness: There is no abdominal tenderness. There is no guarding.  Musculoskeletal:     Cervical back: Normal range of motion.     Right lower leg: No edema.     Left lower leg: No edema.  Lymphadenopathy:     Cervical: No cervical adenopathy.  Skin:    General: Skin is warm.  Neurological:     Mental Status: He is alert and oriented to person, place, and time.  Psychiatric:        Attention and Perception: Attention normal.        Mood and Affect: Mood normal.        Speech: Speech normal.        Behavior: Behavior normal. Behavior is cooperative.     Last depression screening scores    05/29/2022   10:33 AM 05/01/2022   11:19 AM 12/27/2021    4:06 PM  PHQ 2/9 Scores  PHQ - 2 Score 0 0 0  PHQ- 9 Score 1 0 4   Last fall risk screening    05/29/2022   10:33 AM  Fall Risk   Falls in the past year? 0  Number falls in past yr: 0  Injury with Fall? 0  Risk for fall due to : No Fall Risks  Follow up Falls evaluation completed   Last Audit-C alcohol use  screening    05/01/2022   11:17 AM  Alcohol Use Disorder Test (AUDIT)  1. How often do you have a drink containing alcohol? 0  2. How many drinks containing alcohol do you have on a typical day when you are drinking? 0  3. How often do you have six or more drinks on one occasion? 0  AUDIT-C Score 0   A score of 3 or more in women, and 4 or more in men indicates increased risk for alcohol abuse, EXCEPT if all of the points are from question 1   No results found for any visits on 05/29/22.  Assessment & Plan    Routine Health Maintenance and Physical Exam  Exercise Activities and Dietary recommendations --balanced diet high in fiber and protein, low in sugars, carbs, fats. --physical activity/exercise 30 minutes 3-5 times a week     Immunization History  Administered Date(s) Administered   Influenza,inj,Quad PF,6+ Mos 03/24/2014, 01/23/2021, 12/27/2021   Influenza-Unspecified 03/24/2014   Tdap 12/14/2009    Health Maintenance  Topic Date Due   COVID-19 Vaccine (1) Never done   Zoster Vaccines- Shingrix (1 of 2) Never done   Fecal DNA (Cologuard)  Never done   DTaP/Tdap/Td (2 - Td or Tdap) 12/15/2019   Hepatitis C Screening  12/28/2022 (Originally 01/14/1989)   HIV Screening  12/28/2022 (Originally [birth year]13/1985)   Medicare Annual Wellness (AWV)  05/02/2023   INFLUENZA VACCINE  Completed  HPV VACCINES  Aged Out    Discussed health benefits of physical activity, and encouraged him to engage in regular exercise appropriate for his age and condition. Problem List Items Addressed This Visit       Cardiovascular and Mediastinum   AAA (abdominal aortic aneurysm) (HCC)    On extensive chart review could not find evidence of AAA or prior vascular ultrasound As this has been present on his chart, recommending AAA screening and will either remove from chart or follow as indicated       Relevant Orders   VAS [place] AAA DUPLEX   Primary hypertension    Chronic, well controlled Managed  with amlodipine 5 mg, pt also takes prazosin for panic d/o Reviewed cmp F/u 6 mo        Other   Chronic pain associated with significant psychosocial dysfunction    Follows with pain management We supply celebrex BID  Pt feels pain is adequately controlled      Panic disorder    Well controlled now with pain management and prazosin nightly.      Elevated LDL cholesterol level    Manages with lipitor 80 mg LDL now at goal <=70      History of stroke with residual deficit    Follows with OT, PT. Has appt for consult for vivistim.  So much improved from original baseline. Commended on effort.  Pt is protected with lipitor 80 mg and asa 81 mg daily      Other Visit Diagnoses     Annual physical exam    -  Primary        Return in about 6 months (around 11/27/2022) for chronic conditions.     I, Alfredia Ferguson, PA-C have reviewed all documentation for this visit. The documentation on  05/29/22  for the exam, diagnosis, procedures, and orders are all accurate and complete.  Alfredia Ferguson, PA-C [facility] [address] [place], [place], [address] Office: [phone] Fax: [phone]   [facility]

## 2022-05-28 NOTE — Therapy (Signed)
[facility] Rehabilitation at [facility] [address] [place], [place], [address] Phone: [phone]   Fax:  [phone]  Patient Details  Name: David Reeves MRN: [id number] Date of Birth: January 04, 1971 Referring Provider:  Alfredia Ferguson, PA-C  Encounter Date: 05/28/2022   OUTPATIENT PHYSICAL THERAPY TREATMENT NOTE      Patient Name: David Reeves MRN: [id number] DOB:1970-05-01, 52 y.o., male Today's Date: 05/28/2022  PCP: Alfredia Ferguson, PA-C REFERRING PROVIDER: Alfredia Ferguson, PA-C   PT End of Session - 05/28/22 1437     Visit Number 79    Number of Visits 83   corrected number to reflect latest recert from 04/30/2022   Date for PT Re-Evaluation 06/25/22    Authorization Type [employer]    Authorization Time Period 02/01/22-04/26/22    Progress Note Due on Visit 70    PT Start Time 1430    PT Stop Time 1512    PT Time Calculation (min) 42 min    Equipment Utilized During Treatment Gait belt    Activity Tolerance Patient tolerated treatment well;No increased pain    Behavior During Therapy [facility] for tasks assessed/performed                  Past Medical History:  Diagnosis Date   Back pain 04/22/2012   Bone spur    Bulging disc 04/22/2012   Degenerative disc disease    Osteoarthritis    Panic anxiety syndrome    Stroke (HCC) 10/2020   Taking multiple medications for chronic disease    Past Surgical History:  Procedure Laterality Date   CYSTECTOMY     head   HERNIA REPAIR Left 2006,2014   [facility]   TOE SURGERY Right 2007   Patient Active Problem List   Diagnosis Date Noted   Alterations of sensations following cerebrovascular accident 03/08/2021   Aphasia as late effect of cerebrovascular accident (CVA) 03/08/2021   Muscle spasm 02/22/2021   Acute non-recurrent frontal sinusitis 02/22/2021   Chronic pain syndrome 01/23/2021   Primary hypertension 01/23/2021   Nerve pain 01/23/2021   Erectile  dysfunction due to diseases classified elsewhere 01/23/2021   Cognitive dysfunction 01/23/2021   Scrotal edema 01/23/2021   Elevated LDL cholesterol level 01/23/2021   History of stroke with residual deficit 01/23/2021   Primary insomnia 01/23/2021   Mouth pain 01/23/2021   Acute ischemic stroke (HCC) 11/10/2020   Closed compression fracture of L1 vertebra (HCC) 05/24/2016   Lumbar stenosis with neurogenic claudication 11/01/2015   Chronic bilateral low back pain with right-sided sciatica 11/08/2014   Hx of hemorrhoids 11/08/2014   AAA (abdominal aortic aneurysm) (HCC) 09/22/2014   Chronic pain associated with significant psychosocial dysfunction 09/22/2014   Panic disorder 09/22/2014   AB (asthmatic bronchitis) 08/17/2014   Anxiety disorder due to general medical condition 08/17/2014   Backache 08/17/2014   Lumbosacral spondylosis without myelopathy 08/17/2014   Disorder of male genital organs 08/17/2014   Brash 08/17/2014   Low back pain 08/17/2014   Tendon nodule 08/17/2014   Episodic paroxysmal anxiety disorder 08/17/2014   Hernia, inguinal, right 08/17/2014   Fast heart beat 08/17/2014   Inguinal hernia 10/14/2012    REFERRING DIAG: History of stroke with residual deficit, chronic bilateral low back pain with right-sided sciatica, Lumbosacral spondylosis without myelopathy THERAPY DIAG:  Chronic bilateral low back pain without sciatica  Muscle weakness (generalized)  Difficulty in walking, not elsewhere classified  Other lack of coordination  Rationale for Evaluation and Treatment Rehabilitation  PERTINENT HISTORY: Pt. is a 52 y.o. who was diagnosed with a CVA on July 21st, 2022. Pt. completed several weeks of inpatient rehab at [facility]. After returning to home. He was discharged in late August/early September and recieved home health PT. Pt. sustained a fall in december of 2022, and was admitted to the hospital with COVID-19, and Chronic back pain. He reports chronic back  pain syndrome since 2012. Since the most recent discharge, Pt. has been residing with the ex-wife until the Pt. is ready to return to independent living. He did recieve 2 weeks of home health after discharge in December 2022. He is now being referred to outpatient PT to address weakness from stroke and improve fine motor movement. He reports rarely getting numbness/tingling in RLE. PMHx includes: Bilateral LBP with right sided sciatica, Lumbosacral spondylosis myelopathy, closed compression Fx of L1. Has chronic insomnia. osteoarthritis, Panic anxiety syndrome (taking medication) Pt. enjoys cooking, and riding motorcycles. Patient is going to [facility] spine center for Epidural spinal injections on 06/07/21;  PRECAUTIONS: Fall, Spinal Brace wears daily, approximately 25%, when exercising; semi-rigid lumbar brace  SUBJECTIVE: Pt reports pain level is currently 2/10, reports it has been as high as a 4/10 and as low as a 1/10 recently. Pt reports his balance has been good, reports he avoids stairs if at all possible.   PAIN:  Are you having pain? Yes: NPRS scale: 3/10 Pain location: bilat low back Pain description: dull ache; always constant,  Aggravating factors: worse with sitting,  Relieving factors: heat helps temporarily, pain pills/pain patch   TODAY'S TREATMENT 05/28/22  Gait belt donned and CGA provided throughout unless specified otherwise  TherEx: On mat table: LTRs 20x alt sides SLR 20x each LE Sidelye hip abduction 20x each LE   Treadmill endurance training performed at up to 15% elevation, speed maintained for majority of time at 1.4 mph x 7 min. No increase in LBP, pt noticeably fatigued   Stability ball rollout fwd/bckwd/LTL x 4 min  STS with 2000 gr ball 10x  Staggered STS 10x each LE as primary stance leg  Resisted gait 12.5#, 17.5# and 22.5# x multiple reps at each weight FWD/BCKWD   Pt reports pain remains at 2/10 by end of session.  PATIENT EDUCATION:  Education  details: Pt educated throughout session about proper posture and technique with exercises. Improved exercise technique, movement at target joints, use of target muscles after min to mod verbal, visual, tactile cues.  Person educated: Patient Education method: Explanation, Demonstration, Tactile cues, and Verbal cues Education comprehension: verbalized understanding, returned demonstration, verbal cues required, tactile cues required, and needs further education   HOME EXERCISE PROGRAM: No updates on this date pt to continue HEP as previously given     PT Short Term Goals - TARGET DATE .05/28/2022         PT SHORT TERM GOAL #1   Title Patient will be adherent to HEP at least 3x a week to improve functional strength and balance for better safety at home.    Baseline 4/4: doing them 1-2x a week; 08/20/2021=Patient reports performing his low back stretching and no questions currently HEP. 10/24/21: doing HEP 4x a week   Time 4    Period Weeks    Status Achieved    Target Date 08/21/21      PT SHORT TERM GOAL #2   Title Patient (< 6 years old) will complete five times sit to stand test in < 15 seconds indicating an increased LE strength  and improved balance.    Baseline 4/4: 17.85 sec with arms across chest; 08/20/2021= 17.6 sec , 7/5: 12.85 sec   Time 4    Period Weeks    Status Achieved;    Target Date 10/01/21              PT Long Term Goals -  TARGET DATE FOR GOALS 06/25/2022        PT LONG TERM GOAL #1   Title Patient will increase six minute walk test distance to >1000 for progression to community ambulator and improve gait ability    Baseline 4/4: 1115 feet , 7/5: 1080 feet;    Time 4    Period Weeks    Status Achieved    Target Date 08/21/21      PT LONG TERM GOAL #2   Title Patient will ascend/descend 8 stairs without rail assist independently without loss of balance to improve ability to get in/out of home.    Baseline 4/4: requires 1 rail assist; 08/20/2021- Patient  demonstrated steps without railing using reciprocal steps today. 7/5: ascend/descend 6 steps reciprocally without rail, but required CGA for safety when descending; 7/25: Able to complete without UE support and good balance going up steps, unsafe descending steps without UE support. Required min assist and UE support to prevent LOB descending.; 8/15: requires UUE support to descend and close CGA due to; unsteadiness; 9/26: ascend without UE support, recip pattern; descend with UUE support, recip stepping, but still doesn't feel steady yet with descending; 10/13 able to demo recip pattern ascending hands free, required instance of UE support with descending, recip pattern due to heel getting caught under step; 11/14: deferred; 11/21: mod assist with descending due to unsteadiness, able to complete without UE support ascending; 12/21: Pt reports walking down about 24 stairs without BUE support, reports he did misstep partially but recovered his balance,    Time 12*corrected   Period Weeks    Status Partially met   Target Date      PT LONG TERM GOAL #3   Title Patient will increase BLE gross strength to 4+/5 as to improve functional strength for independent gait, increased standing tolerance and increased ADL ability.    Baseline 4/4: not formally assessed; 08/20/2021= 4+/5 with Right hip flex/knee ext/flex; Right hip abd= 4/5, 7/5: 4+/5 hip    Time 12    Period Weeks    Status Achieved;    Target Date 11/12/21      PT LONG TERM GOAL #4   Title Patient will improve FOTO score to >50% to indicate improved functional mobility with less pain with ADLs.    Baseline 4/4: 35%; 08/20/2021= 42% 7/5: 47%; 7:25:recently addressed; 11/30/21: 35; 9/26: 54%    Time 12    Period Weeks    Status MET   Target Date      PT LONG TERM GOAL #5   Title Patient will report a worst pain of 4/10 in low back over last week to indicate improved tolerance with ADLs.    Baseline 4/4: 7/10; 08/20/2021= 4/10 , 7/5: worst 7/10 in  low back; 7/25: 6/10 ; 8/15: pt reports 6/10; 9/26: 4/10; 04/30/22: pt reports worst pain is 4/10   Time 12    Period Weeks    Status Achieved    Target Date 02/05/2022      Additional Long Term Goals   Additional Long Term Goals Yes      PT LONG TERM GOAL #6   Title  Pt will increase by at least 0.13 m/s in order to demonstrate clinically significant improvement in community ambulation    Baseline 08/20/2021=0.94 m/s 7/5: 1.5 m/s   Time 12    Period Weeks    Status Achieved;    Target Date 11/12/21      PT LONG TERM GOAL #7   Title Patient will demonstrate improved static standing balance as seen by ability to single leg stance on right LE > 12 sec consistently for optimal balance on level and unlevel surfaces.    Baseline 08/20/2021= 4 sec SLS on right , 7/5: 4-5 sec inconsistent; 7/25 able to achieve 15 sec after multiple attempts of 4-5 sec holds; 8/15: LLE 15 sec, RLE 8 sec; 9/26: LLE 30 sec, RLE 17 seconds   Time 12    Period Weeks    Status MET   Target Date 02/05/2022    PT LONG TERM GOAL #8  Title Pt will report ability to ambulate for at least 15 minutes without an increase in his LBP in order to improve community participation and ease with ADLs  Baseline 9/26: increase in pain at 15 min mark; 10/13: reports about 20 minutes.   Time 3   Period Weeks   Status MET  Target Date 02/05/2022        PT LONG TERM GOAL #9  Title Pt will report ability to ambulate for at least 45 minutes without an increase in his LBP in order to improve community participation and ease with ADLs  Baseline 9/26: increase in pain at 15 min mark; 10/13: reports about 20 minutes. 11/14: deferred; 11/21: Unable to complete 45 min gait without pain, but does report this has improved. Thinks he can do 30 min before pain increase.; 12/21: Pt unsure currently due to recent change in pain medication dose; 04/30/2022: pt reports he has not walked as much lately, is unsure  Time 3   Period Weeks   Status  ONGOING  Target Date        Plan -     Clinical Impression Statement Pt progresses endurance and resisted gait interventions without increase in LBP from baseline levels, no unsteadiness noted with interventions. PT discussed that next session is a progress note and will determine if pt needs more PT. Pt verbalized understanding. The pt will continue to benefit from skilled physical therapy intervention to address impairments, improve QOL, and attain therapy goals.     Personal Factors and Comorbidities Comorbidity 3+;Past/Current Experience;Time since onset of injury/illness/exacerbation    Comorbidities PMHx includes: Bilateral LBP with right sided sciatica, Lumbosacral spondylosis myelopathy, closed compression Fx of L1. Has chronic insomnia. osteoarthritis, Panic anxiety syndrome (taking medication)    Examination-Activity Limitations Bend;Carry;Locomotion Level;Sit;Squat;Stairs;Stand;Transfers    Examination-Participation Restrictions Cleaning;Community Activity;Driving;Laundry;Meal Prep;Occupation;Shop;Volunteer;Yard Work    Stability/Clinical Decision Making Stable/Uncomplicated    Rehab Potential Good    PT Frequency 1x / week    PT Duration 8 weeks    PT Treatment/Interventions Cryotherapy;Electrical Stimulation;Moist Heat;Traction;Gait training;Stair training;Functional mobility training;Therapeutic activities;Therapeutic exercise;Balance training;Neuromuscular re-education;Patient/family education;Manual techniques;Passive range of motion;Dry needling;Energy conservation    PT Next Visit Plan  Progress core stabilization, Manual therapy for low back and LE ROM, instruct in balance HEP, continue plan       Consulted and Agree with Plan of Care Patient              5:32 PM, 05/28/22 Baird Kay PT    Physical Therapist -  [facility]  Outpatient  Physical Therapy- [facility] at [facility] [place], [place], [address] Phone: [phone]   Fax:  [phone]

## 2022-05-29 ENCOUNTER — Encounter: Payer: Self-pay | Admitting: Physician Assistant

## 2022-05-29 ENCOUNTER — Encounter: Payer: Medicare Other | Admitting: Physician Assistant

## 2022-05-29 ENCOUNTER — Ambulatory Visit (INDEPENDENT_AMBULATORY_CARE_PROVIDER_SITE_OTHER): Payer: 59 | Admitting: Physician Assistant

## 2022-05-29 VITALS — BP 124/82 | HR 69 | Ht 73.0 in | Wt 222.5 lb

## 2022-05-29 DIAGNOSIS — Z0000 Encounter for general adult medical examination without abnormal findings: Secondary | ICD-10-CM

## 2022-05-29 DIAGNOSIS — I6930 Unspecified sequelae of cerebral infarction: Secondary | ICD-10-CM

## 2022-05-29 DIAGNOSIS — G894 Chronic pain syndrome: Secondary | ICD-10-CM | POA: Diagnosis not present

## 2022-05-29 DIAGNOSIS — G8921 Chronic pain due to trauma: Secondary | ICD-10-CM | POA: Diagnosis not present

## 2022-05-29 DIAGNOSIS — I714 Abdominal aortic aneurysm, without rupture, unspecified: Secondary | ICD-10-CM

## 2022-05-29 DIAGNOSIS — M791 Myalgia, unspecified site: Secondary | ICD-10-CM | POA: Diagnosis not present

## 2022-05-29 DIAGNOSIS — F410 Panic disorder [episodic paroxysmal anxiety] without agoraphobia: Secondary | ICD-10-CM

## 2022-05-29 DIAGNOSIS — I10 Essential (primary) hypertension: Secondary | ICD-10-CM

## 2022-05-29 DIAGNOSIS — Z79891 Long term (current) use of opiate analgesic: Secondary | ICD-10-CM | POA: Diagnosis not present

## 2022-05-29 DIAGNOSIS — I69351 Hemiplegia and hemiparesis following cerebral infarction affecting right dominant side: Secondary | ICD-10-CM | POA: Diagnosis not present

## 2022-05-29 DIAGNOSIS — I69322 Dysarthria following cerebral infarction: Secondary | ICD-10-CM | POA: Diagnosis not present

## 2022-05-29 DIAGNOSIS — G8929 Other chronic pain: Secondary | ICD-10-CM | POA: Diagnosis not present

## 2022-05-29 DIAGNOSIS — E7800 Pure hypercholesterolemia, unspecified: Secondary | ICD-10-CM | POA: Diagnosis not present

## 2022-05-29 NOTE — Assessment & Plan Note (Signed)
Follows with pain management We supply celebrex BID  Pt feels pain is adequately controlled

## 2022-05-29 NOTE — Assessment & Plan Note (Signed)
Manages with lipitor 80 mg LDL now at goal <=70

## 2022-05-29 NOTE — Assessment & Plan Note (Signed)
Well controlled now with pain management and prazosin nightly.

## 2022-05-29 NOTE — Assessment & Plan Note (Addendum)
Follows with OT, PT. Has appt for consult for vivistim.  So much improved from original baseline. Commended on effort.  Pt is protected with lipitor 80 mg and asa 81 mg daily

## 2022-05-29 NOTE — Assessment & Plan Note (Signed)
On extensive chart review could not find evidence of AAA or prior vascular ultrasound As this has been present on his chart, recommending AAA screening and will either remove from chart or follow as indicated

## 2022-05-29 NOTE — Assessment & Plan Note (Signed)
Chronic, well controlled Managed with amlodipine 5 mg, pt also takes prazosin for panic d/o Reviewed cmp F/u 6 mo

## 2022-05-30 ENCOUNTER — Ambulatory Visit: Payer: 59

## 2022-06-04 ENCOUNTER — Ambulatory Visit: Payer: 59

## 2022-06-04 DIAGNOSIS — Z8673 Personal history of transient ischemic attack (TIA), and cerebral infarction without residual deficits: Secondary | ICD-10-CM | POA: Diagnosis not present

## 2022-06-06 ENCOUNTER — Ambulatory Visit: Payer: 59

## 2022-06-06 ENCOUNTER — Telehealth: Payer: Self-pay | Admitting: Physician Assistant

## 2022-06-06 NOTE — Telephone Encounter (Signed)
Medication is not on pt list. Please advise

## 2022-06-06 NOTE — Telephone Encounter (Signed)
Ignore pt is on a different nsaid now

## 2022-06-06 NOTE — Telephone Encounter (Signed)
[facility] faxed refill request for the following medications:  diclofenac (VOLTAREN) 50 MG EC tablet    Please advise.

## 2022-06-11 ENCOUNTER — Ambulatory Visit: Payer: 59

## 2022-06-11 DIAGNOSIS — G8929 Other chronic pain: Secondary | ICD-10-CM | POA: Diagnosis not present

## 2022-06-11 DIAGNOSIS — R262 Difficulty in walking, not elsewhere classified: Secondary | ICD-10-CM | POA: Diagnosis not present

## 2022-06-11 DIAGNOSIS — M545 Low back pain, unspecified: Secondary | ICD-10-CM | POA: Diagnosis not present

## 2022-06-11 DIAGNOSIS — R278 Other lack of coordination: Secondary | ICD-10-CM

## 2022-06-11 DIAGNOSIS — R2681 Unsteadiness on feet: Secondary | ICD-10-CM | POA: Diagnosis not present

## 2022-06-11 DIAGNOSIS — M6281 Muscle weakness (generalized): Secondary | ICD-10-CM

## 2022-06-11 NOTE — Therapy (Signed)
[facility] at [facility] [place], [place], [address] Phone: [phone]   Fax:  [phone]  Patient Details  Name: Austinjames Trease MRN: [id number] Date of Birth: 12-03-1970 Referring Provider:  [name], PA-C  Encounter Date: 06/11/2022   OUTPATIENT PHYSICAL THERAPY TREATMENT NOTE/DISCHARGE/Physical Therapy Progress Note   Dates of reporting period  04/30/2022   to   06/11/2022       Patient Name: David Reeves MRN: [id number] DOB:[birth year], 52 y.o., male Today's Date: 06/11/2022  PCP: [name], PA-C REFERRING PROVIDER: [name], PA-C   PT End of Session - 06/11/22 0950     Visit Number 17    Number of Visits 57   corrected number to reflect latest recert from [id number]   Date for PT Re-Evaluation 06/25/22    Authorization Type [employer]    Authorization Time Period 02/01/22-04/26/22    Progress Note Due on Visit 77    PT Start Time 0932    PT Stop Time 1012    PT Time Calculation (min) 40 min    Equipment Utilized During Treatment --    Activity Tolerance Patient tolerated treatment well;No increased pain    Behavior During Therapy [facility] for tasks assessed/performed                  Past Medical History:  Diagnosis Date   Back pain 04/22/2012   Bone spur    Bulging disc 04/22/2012   Degenerative disc disease    Osteoarthritis    Panic anxiety syndrome    Stroke ([place]) 10/2020   Taking multiple medications for chronic disease    Past Surgical History:  Procedure Laterality Date   CYSTECTOMY     head   HERNIA REPAIR Left 2006,2014   [facility]   TOE SURGERY Right 2007   Patient Active Problem List   Diagnosis Date Noted   Alterations of sensations following cerebrovascular accident 03/08/2021   Aphasia as late effect of cerebrovascular accident (CVA) 03/08/2021   Chronic pain syndrome 01/23/2021   Primary hypertension 01/23/2021   Erectile dysfunction  due to diseases classified elsewhere 01/23/2021   Cognitive dysfunction 01/23/2021   Elevated LDL cholesterol level 01/23/2021   History of stroke with residual deficit 01/23/2021   Primary insomnia 01/23/2021   Acute ischemic stroke ([place]) 11/10/2020   Closed compression fracture of L1 vertebra ([place]) 05/24/2016   Lumbar stenosis with neurogenic claudication 11/01/2015   Chronic bilateral low back pain with right-sided sciatica 11/08/2014   Hx of hemorrhoids 11/08/2014   AAA (abdominal aortic aneurysm) ([place]) 09/22/2014   Chronic pain associated with significant psychosocial dysfunction 09/22/2014   Panic disorder 09/22/2014   AB (asthmatic bronchitis) 08/17/2014   Anxiety disorder due to general medical condition 08/17/2014   Lumbosacral spondylosis without myelopathy 08/17/2014   Brash 08/17/2014   Episodic paroxysmal anxiety disorder 08/17/2014   Hernia, inguinal, right 08/17/2014   Inguinal hernia 10/14/2012    REFERRING DIAG: History of stroke with residual deficit, chronic bilateral low back pain with right-sided sciatica, Lumbosacral spondylosis without myelopathy THERAPY DIAG:  Chronic bilateral low back pain without sciatica  Muscle weakness (generalized)  Other lack of coordination  Unsteadiness on feet  Rationale for Evaluation and Treatment Rehabilitation  PERTINENT HISTORY: Pt. is a 52 y.o. who was diagnosed with a CVA on July 21st, 2022. Pt. completed several weeks of inpatient rehab at [facility]. After returning to home. He was discharged in late August/early September and recieved home health  PT. Pt. sustained a fall in december of 2022, and was admitted to the hospital with COVID-19, and Chronic back pain. He reports chronic back pain syndrome since 2012. Since the most recent discharge, Pt. has been residing with the ex-wife until the Pt. is ready to return to independent living. He did recieve 2 weeks of home health after discharge in December 2022. He is now being  referred to outpatient PT to address weakness from stroke and improve fine motor movement. He reports rarely getting numbness/tingling in RLE. PMHx includes: Bilateral LBP with right sided sciatica, Lumbosacral spondylosis myelopathy, closed compression Fx of L1. Has chronic insomnia. osteoarthritis, Panic anxiety syndrome (taking medication) Pt. enjoys cooking, and riding motorcycles. Patient is going to [facility] spine center for Epidural spinal injections on 06/07/21;  PRECAUTIONS: Fall, Spinal Brace wears daily, approximately 25%, when exercising; semi-rigid lumbar brace  SUBJECTIVE: Pt has vivistim consult tomorrow. Pt reports he took pain medicine late today. Pt rates his pain 2-2.5/10. He is ready for PT discharge   PAIN:  Are you having pain? Yes: NPRS scale: 3/10 Pain location: bilat low back Pain description: dull ache; always constant,  Aggravating factors: worse with sitting,  Relieving factors: heat helps temporarily, pain pills/pain patch   TODAY'S TREATMENT 06/11/22  Gait belt donned and CGA provided throughout unless specified otherwise  TherAct: Goals reviewed for discharge and progress note. Please refer to goal section for detail (below)  PT updates HEP for pt to continue following discharge. Interventions reviewed in session:  On large mat table: LTRS 20+ reps each side Glute bridge 2x20  Sidelye hip abd 2x20 each LE SLR 2x20 each LE    PATIENT EDUCATION:  Education details: Pt educated throughout session about proper posture and technique with exercises. Improved exercise technique, movement at target joints, use of target muscles after min to mod verbal, visual, tactile cues. Discharge recommendations, updated HEP for discharge  Person educated: Patient Education method: Explanation, Demonstration, Tactile cues, Verbal cues, and Handouts Education comprehension: verbalized understanding, returned demonstration, verbal cues required, tactile cues required, and needs  further education   HOME EXERCISE PROGRAM:  Updated for discharge:  Access Code: [email or web address] URL: [email or web address] Date: 06/11/2022 Prepared by: Ricard Dillon  Exercises - Supine Lower Trunk Rotation  - 1 x daily - 7 x weekly - 2 sets - 10 reps - Supine Bridge  - 1 x daily - 5 x weekly - 2 sets - 20 reps - Sidelying Hip Abduction  - 1 x daily - 5 x weekly - 2 sets - 15-20 reps - Active Straight Leg Raise with Quad Set  - 1 x daily - 5 x weekly - 2 sets - 20 reps prev     PT Short Term Goals - TARGET DATE .05/28/2022         PT SHORT TERM GOAL #1   Title Patient will be adherent to HEP at least 3x a week to improve functional strength and balance for better safety at home.    Baseline 4/4: doing them 1-2x a week; 08/20/2021=Patient reports performing his low back stretching and no questions currently HEP. 10/24/21: doing HEP 4x a week   Time 4    Period Weeks    Status Achieved    Target Date 08/21/21      PT SHORT TERM GOAL #2   Title Patient (< 38 years old) will complete five times sit to stand test in < 15 seconds indicating an increased LE strength and improved balance.  Baseline 4/4: 17.85 sec with arms across chest; 08/20/2021= 17.6 sec , 7/5: 12.85 sec   Time 4    Period Weeks    Status Achieved;    Target Date 10/01/21              PT Long Term Goals -  TARGET DATE FOR GOALS 06/25/2022        PT LONG TERM GOAL #1   Title Patient will increase six minute walk test distance to >1000 for progression to community ambulator and improve gait ability    Baseline 4/4: 1115 feet , 7/5: 1080 feet;    Time 4    Period Weeks    Status Achieved    Target Date 08/21/21      PT LONG TERM GOAL #2   Title Patient will ascend/descend 8 stairs without rail assist independently without loss of balance to improve ability to get in/out of home.    Baseline 4/4: requires 1 rail assist; 08/20/2021- Patient demonstrated steps without railing using reciprocal steps  today. 7/5: ascend/descend 6 steps reciprocally without rail, but required CGA for safety when descending; 7/25: Able to complete without UE support and good balance going up steps, unsafe descending steps without UE support. Required min assist and UE support to prevent LOB descending.; 8/15: requires UUE support to descend and close CGA due to; unsteadiness; 9/26: ascend without UE support, recip pattern; descend with UUE support, recip stepping, but still doesn't feel steady yet with descending; 10/13 able to demo recip pattern ascending hands free, required instance of UE support with descending, recip pattern due to heel getting caught under step; 11/14: deferred; 11/21: mod assist with descending due to unsteadiness, able to complete without UE support ascending; 12/21: Pt reports walking down about 24 stairs without BUE support, reports he did misstep partially but recovered his balance; 2/20: Pt reports descending 20+ steps today, using a handrail without losing balance. Pt reports he is able to ascend 3 steps without a handrail without LOB   Time 12*corrected   Period Weeks    Status Partially met   Target Date      PT LONG TERM GOAL #3   Title Patient will increase BLE gross strength to 4+/5 as to improve functional strength for independent gait, increased standing tolerance and increased ADL ability.    Baseline 4/4: not formally assessed; 08/20/2021= 4+/5 with Right hip flex/knee ext/flex; Right hip abd= 4/5, 7/5: 4+/5 hip    Time 12    Period Weeks    Status Achieved;    Target Date 11/12/21      PT LONG TERM GOAL #4   Title Patient will improve FOTO score to >50% to indicate improved functional mobility with less pain with ADLs.    Baseline 4/4: 35%; 08/20/2021= 42% 7/5: 47%; 7:25:recently addressed; 11/30/21: 35; 9/26: 54%; 2/20: 51%    Time 12    Period Weeks    Status MET   Target Date      PT LONG TERM GOAL #5   Title Patient will report a worst pain of 4/10 in low back over last  week to indicate improved tolerance with ADLs.    Baseline 4/4: 7/10; 08/20/2021= 4/10 , 7/5: worst 7/10 in low back; 7/25: 6/10 ; 8/15: pt reports 6/10; 9/26: 4/10; 04/30/22: pt reports worst pain is 4/10   Time 12    Period Weeks    Status Achieved    Target Date 02/05/2022      Additional Long  Term Goals   Additional Long Term Goals Yes      PT LONG TERM GOAL #6   Title Pt will increase 10MWT by at least 0.13 m/s in order to demonstrate clinically significant improvement in community ambulation    Baseline 08/20/2021=0.94 m/s 7/5: 1.5 m/s   Time 12    Period Weeks    Status Achieved;    Target Date 11/12/21      PT LONG TERM GOAL #7   Title Patient will demonstrate improved static standing balance as seen by ability to single leg stance on right LE > 12 sec consistently for optimal balance on level and unlevel surfaces.    Baseline 08/20/2021= 4 sec SLS on right , 7/5: 4-5 sec inconsistent; 7/25 able to achieve 15 sec after multiple attempts of 4-5 sec holds; 8/15: LLE 15 sec, RLE 8 sec; 9/26: LLE 30 sec, RLE 17 seconds   Time 12    Period Weeks    Status MET   Target Date 02/05/2022    PT LONG TERM GOAL #8  Title Pt will report ability to ambulate for at least 15 minutes without an increase in his LBP in order to improve community participation and ease with ADLs  Baseline 9/26: increase in pain at 15 min mark; 10/13: reports about 20 minutes.   Time 3   Period Weeks   Status MET  Target Date 02/05/2022        PT LONG TERM GOAL #9  Title Pt will report ability to ambulate for at least 45 minutes without an increase in his LBP in order to improve community participation and ease with ADLs  Baseline 9/26: increase in pain at 15 min mark; 10/13: reports about 20 minutes. 11/14: deferred; 11/21: Unable to complete 45 min gait without pain, but does report this has improved. Thinks he can do 30 min before pain increase.; 12/21: Pt unsure currently due to recent change in pain medication  dose; 04/30/2022: pt reports he has not walked as much lately, is unsure; 06/11/22: Pt reports walking for longer periods of time are more comfortable than standing. Pt reports he is able to walk for 30 minutes before his back pain starts to increase, if having a good day can walk for 45 minutes (says this is about 2x/week)  Time 3   Period Weeks   Status PARTIALLY MET   Target Date        Plan -     Clinical Impression Statement Pt presents for final PT session and is agreeable to discharge today. Goals reviewed with all remaining goals partially met (other goals previously met). This indicates pt overall has improved his ability to ambulate due to improvements in back pain and has improved his balance. Pt has otherwise reached plateau with progress and is satisfied with improvements in pain and functional mobility. PT updated HEP for discharge and provided discharge recommendations. The pt does not require further skilled PT at this time.    Personal Factors and Comorbidities Comorbidity 3+;Past/Current Experience;Time since onset of injury/illness/exacerbation    Comorbidities PMHx includes: Bilateral LBP with right sided sciatica, Lumbosacral spondylosis myelopathy, closed compression Fx of L1. Has chronic insomnia. osteoarthritis, Panic anxiety syndrome (taking medication)    Examination-Activity Limitations Bend;Carry;Locomotion Level;Sit;Squat;Stairs;Stand;Transfers    Examination-Participation Restrictions Cleaning;Community Activity;Driving;Laundry;Meal Prep;Occupation;Shop;Volunteer;Yard Work    Stability/Clinical Decision Making Stable/Uncomplicated    Rehab Potential Good    PT Frequency 1x / week    PT Duration 8 weeks    PT  Treatment/Interventions Cryotherapy;Electrical Stimulation;Moist Heat;Traction;Gait training;Stair training;Functional mobility training;Therapeutic activities;Therapeutic exercise;Balance training;Neuromuscular re-education;Patient/family education;Manual  techniques;Passive range of motion;Dry needling;Energy conservation    PT Next Visit Plan  Progress core stabilization, Manual therapy for low back and LE ROM, instruct in balance HEP, continue plan       Consulted and Agree with Plan of Care Patient              2:53 PM, 06/11/22 Zollie Pee PT    Physical Therapist - [facility]  Outpatient Physical Therapy- [facility] at [facility] [place], [place], [address] Phone: [phone]   Fax:  [phone]

## 2022-06-12 ENCOUNTER — Encounter: Payer: 59 | Attending: Physical Medicine & Rehabilitation | Admitting: Physical Medicine & Rehabilitation

## 2022-06-12 ENCOUNTER — Encounter: Payer: Self-pay | Admitting: Physical Medicine & Rehabilitation

## 2022-06-12 ENCOUNTER — Other Ambulatory Visit (INDEPENDENT_AMBULATORY_CARE_PROVIDER_SITE_OTHER): Payer: Self-pay

## 2022-06-12 VITALS — BP 103/69 | HR 68 | Ht 73.0 in | Wt 224.6 lb

## 2022-06-12 DIAGNOSIS — M47817 Spondylosis without myelopathy or radiculopathy, lumbosacral region: Secondary | ICD-10-CM | POA: Diagnosis not present

## 2022-06-12 DIAGNOSIS — I69398 Other sequelae of cerebral infarction: Secondary | ICD-10-CM | POA: Insufficient documentation

## 2022-06-12 DIAGNOSIS — R252 Cramp and spasm: Secondary | ICD-10-CM | POA: Diagnosis not present

## 2022-06-12 DIAGNOSIS — G8111 Spastic hemiplegia affecting right dominant side: Secondary | ICD-10-CM | POA: Insufficient documentation

## 2022-06-12 MED ORDER — TIZANIDINE HCL 4 MG PO TABS: 6.0000 mg | ORAL_TABLET | Freq: Four times a day (QID) | ORAL | 3 refills | Status: DC | PRN

## 2022-06-12 NOTE — Patient Instructions (Addendum)
ALWAYS FEEL FREE TO CALL OUR OFFICE WITH ANY PROBLEMS OR QUESTIONS [id number])  **PLEASE NOTE** ALL MEDICATION REFILL REQUESTS (INCLUDING CONTROLLED SUBSTANCES) NEED TO BE MADE AT LEAST 7 DAYS PRIOR TO REFILL BEING DUE. ANY REFILL REQUESTS INSIDE THAT TIME FRAME MAY RESULT IN DELAYS IN RECEIVING YOUR PRESCRIPTION.    TIZANIDINE  6MG IN MORNING 4MG AT LUNCH AND DINNER AND THEN 6MG AT BEDTIME FOR 5 DAYS.   THEN GO UP TO 6MG 4 X DAILY.

## 2022-06-12 NOTE — Progress Notes (Signed)
Subjective:    Patient ID: David Reeves, male    DOB: [birth year], 52 y.o.   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  He received inpatient rehab at [facility].  History also notable for chronic low back pain with right-sided radiculopathy and documented lumbosacral spondylosis with burst fracture at L1.  MRI from 03/06/2021 also notable for chronic right paracentral disc extrusion at L1-L2 as well as disc degeneration and eccentric disc bulging to the left L3-L4 and L4-L5 with mild stenosis at L3-L4.  Also notable for multifactorial moderate stenosis at L5-S1.  He presents here today for spasticity evaluation as pt is a candidate for vivistim.His right arm has been most affected.   He is able to open a door, open the fridge, and other gross tasks but he still lacks fine motor movement in the hand and arm. He has ongoing spasiticty in the RUE which affects his movement the most. He complains of mild sensory loss in the arm as well as the right leg. He has much better motor control and strength in his right leg.   He takes tizanidine 27m qid which has been most effective. He also takes valium 574mevery 12 hours. He has tried baclofen. He has not attempted any further increases in these medications. He has not attempted valium      Pain Inventory Average Pain 2 Pain Right Now 2 My pain is sharp, burning, dull, stabbing, and aching  LOCATION OF PAIN  back  BOWEL Number of stools per week: 3-4 Oral laxative use Yes  Type of laxative common Enema or suppository use No  History of colostomy No  Incontinent No   BLADDER Normal In and out cath, frequency . Able to self cath  . Bladder incontinence No  Frequent urination No  Leakage with coughing No  Difficulty starting stream No  Incomplete bladder emptying No    Mobility walk without assistance how many minutes can you walk? 15-20 ability to climb steps?  yes do you drive?   yes  Function disabled: date disabled 2014  Neuro/Psych anxiety  Prior Studies New pt  Physicians involved in your care New pt   Family History  Problem Relation Age of Onset   Diabetes Mother    Diabetes Maternal Uncle    Diabetes Maternal Grandmother    Heart disease Maternal Grandmother    Social History   Socioeconomic History   Marital status: Single    Spouse name: mikki    Number of children: 1   Years of education: Not on file   Highest education level: Some college, no degree  Occupational History   Not on file  Tobacco Use   Smoking status: Never   Smokeless tobacco: Never   Tobacco comments:    cbd    Vaping Use   Vaping Use: Never used  Substance and Sexual Activity   Alcohol use: Not Currently    Comment: last used 12-22-17   Drug use: Not Currently    Types: Marijuana    Comment: October 14, 2017   Sexual activity: Not Currently  Other Topics Concern   Not on file  Social History Narrative   Not on file   Social Determinants of Health   Financial Resource Strain: Low Risk  (05/01/2022)   Overall Financial Resource Strain (CARDIA)    Difficulty of Paying Living Expenses: Not hard at all  Food Insecurity: No Food Insecurity (05/01/2022)  Hunger Vital Sign    Worried About Running Out of Food in the Last Year: Never true    Ran Out of Food in the Last Year: Never true  Transportation Needs: No Transportation Needs (05/01/2022)   PRAPARE - Hydrologist (Medical): No    Lack of Transportation (Non-Medical): No  Physical Activity: Insufficiently Active (05/01/2022)   Exercise Vital Sign    Days of Exercise per Week: 2 days    Minutes of Exercise per Session: 20 min  Stress: No Stress Concern Present (05/01/2022)   [place]    Feeling of Stress : Only a little  Social Connections: Socially Isolated (05/01/2022)   Social Connection and Isolation Panel  [NHANES]    Frequency of Communication with Friends and Family: More than three times a week    Frequency of Social Gatherings with Friends and Family: Three times a week    Attends Religious Services: Never    Active Member of Clubs or Organizations: No    Attends Archivist Meetings: Never    Marital Status: Divorced   Past Surgical History:  Procedure Laterality Date   CYSTECTOMY     head   HERNIA REPAIR Left [phone]   [facility]   TOE SURGERY Right 2007   Past Medical History:  Diagnosis Date   Back pain 04/22/2012   Bone spur    Bulging disc 04/22/2012   Degenerative disc disease    Osteoarthritis    Panic anxiety syndrome    Stroke ([place]) 10/2020   Taking multiple medications for chronic disease    BP 103/69   Pulse 68   Ht 6' 1"$  (1.854 m)   Wt 224 lb 9.6 oz (101.9 kg)   SpO2 98%   BMI 29.63 kg/m   Opioid Risk Score:   Fall Risk Score:  `1  Depression screen [facility] 2/9     06/12/2022    9:25 AM 05/29/2022   10:33 AM 05/01/2022   11:19 AM 12/27/2021    4:06 PM 05/03/2021    3:06 PM 02/12/2021   11:35 AM 02/14/2020   12:51 PM  Depression screen [facility] 2/9  Decreased Interest 0 0 0 0 2 0 0  Down, Depressed, Hopeless 0 0 0 0 0 1 0  PHQ - 2 Score 0 0 0 0 2 1 0  Altered sleeping 0 1 0 2 2    Tired, decreased energy 0 0 0 0 2    Change in appetite 0 0 0 2 0    Feeling bad or failure about yourself  0 0 0 0 0    Trouble concentrating 0 0 0 0 2    Moving slowly or fidgety/restless 0 0 0 0 3    Suicidal thoughts 0 0 0 0 0    PHQ-9 Score 0 1 0 4 11    Difficult doing work/chores  Not difficult at all Not difficult at all Somewhat difficult Somewhat difficult          Family History  Problem Relation Age of Onset   Diabetes Mother    Diabetes Maternal Uncle    Diabetes Maternal Grandmother    Heart disease Maternal Grandmother    Social History   Socioeconomic History   Marital status: Single    Spouse name: mikki    Number of children: 1   Years of  education: Not on file   Highest education level: Some college,  no degree  Occupational History   Not on file  Tobacco Use   Smoking status: Never   Smokeless tobacco: Never   Tobacco comments:    cbd    Vaping Use   Vaping Use: Never used  Substance and Sexual Activity   Alcohol use: Not Currently    Comment: last used 12-22-17   Drug use: Not Currently    Types: Marijuana    Comment: October 14, 2017   Sexual activity: Not Currently  Other Topics Concern   Not on file  Social History Narrative   Not on file   Social Determinants of Health   Financial Resource Strain: Low Risk  (05/01/2022)   Overall Financial Resource Strain (CARDIA)    Difficulty of Paying Living Expenses: Not hard at all  Food Insecurity: No Food Insecurity (05/01/2022)   Hunger Vital Sign    Worried About Running Out of Food in the Last Year: Never true    Joes in the Last Year: Never true  Transportation Needs: No Transportation Needs (05/01/2022)   PRAPARE - Hydrologist (Medical): No    Lack of Transportation (Non-Medical): No  Physical Activity: Insufficiently Active (05/01/2022)   Exercise Vital Sign    Days of Exercise per Week: 2 days    Minutes of Exercise per Session: 20 min  Stress: No Stress Concern Present (05/01/2022)   [place]    Feeling of Stress : Only a little  Social Connections: Socially Isolated (05/01/2022)   Social Connection and Isolation Panel [NHANES]    Frequency of Communication with Friends and Family: More than three times a week    Frequency of Social Gatherings with Friends and Family: Three times a week    Attends Religious Services: Never    Active Member of Clubs or Organizations: No    Attends Archivist Meetings: Never    Marital Status: Divorced   Past Surgical History:  Procedure Laterality Date   CYSTECTOMY     head   HERNIA REPAIR Left [phone]    [facility]   TOE SURGERY Right 2007   Past Medical History:  Diagnosis Date   Back pain 04/22/2012   Bone spur    Bulging disc 04/22/2012   Degenerative disc disease    Osteoarthritis    Panic anxiety syndrome    Stroke ([place]) 10/2020   Taking multiple medications for chronic disease    BP 103/69   Pulse 68   Ht 6' 1"$  (1.854 m)   Wt 224 lb 9.6 oz (101.9 kg)   SpO2 98%   BMI 29.63 kg/m   Opioid Risk Score:   Fall Risk Score:  `1  Depression screen [facility] 2/9     06/12/2022    9:25 AM 05/29/2022   10:33 AM 05/01/2022   11:19 AM 12/27/2021    4:06 PM 05/03/2021    3:06 PM 02/12/2021   11:35 AM 02/14/2020   12:51 PM  Depression screen PH[facility] 2/9  Decreased Interest 0 0 0 0 2 0 0  Down, Depressed, Hopeless 0 0 0 0 0 1 0  PHQ - 2 Score 0 0 0 0 2 1 0  Altered sleeping 0 1 0 2 2    Tired, decreased energy 0 0 0 0 2    Change in appetite 0 0 0 2 0    Feeling bad or failure about yourself  0 0 0 0  0    Trouble concentrating 0 0 0 0 2    Moving slowly or fidgety/restless 0 0 0 0 3    Suicidal thoughts 0 0 0 0 0    PHQ-9 Score 0 1 0 4 11    Difficult doing work/chores  Not difficult at all Not difficult at all Somewhat difficult Somewhat difficult      Review of Systems  Musculoskeletal:  Positive for back pain.  Neurological:  Positive for weakness.  All other systems reviewed and are negative.     Objective:   Physical Exam  Gen: no distress, normal appearing HEENT: oral mucosa pink and moist, NCAT Cardio: Reg rate Chest: normal effort, normal rate of breathing Abd: soft, non-distended Ext: no edema Psych: pleasant, normal affect Skin: intact Neuro: Patient is alert and oriented x 3.  Demonstrates mild facial droop on the right and dysarthria but speech is clearly intelligible.  Cognitively he demonstrates good insight and awareness and functional memory.  Right upper extremity is 3 - to 3 out of 5 with shoulder abduction 4 to 4+ out of 5 with elbow flexion, 4 out of 5 elbow  extension.  Wrist and hand is 2-3 out of 5.  Right lower extremity grossly 4 to 4+ out of 5 throughout.  Sensory 1 out of 2 right arm and leg.  Tone is trace to 1 out of 4 pectoralis muscles, 1+ to 2 out of 4 biceps and brachioradialis 2 out of 4 pronator teres plus or minus pronator quadratus.  No significant flexor tone in the hand although patient is are limited in flexion due to some tightness in extension of the fingers.  Deep tendon reflexes are 3+ in the right upper extremity and 2+ right lower extremity.  Patient ambulated with reasonable swing of the right leg and heel strike. Musculoskeletal: Digits of right hand slightly tight in extension.      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  Right upper extremity is most affected. 2.  Chronic low back pain followed by pain clinic   Plan: Will titrate tizanidine up to 6 mg 4 times daily over 5 days.  Schedule was provided for the patient Will arrange for Botox injections to the right biceps, brachial radialis, pronator teres and potentially pronators quadratus.  300 units Vivistim evaluation pending   Patient will follow up with me over the next few weeks.Forty-five minutes of face to face patient care time were spent during this visit. All questions were encouraged and answered.  Marland Kitchen

## 2022-06-13 ENCOUNTER — Ambulatory Visit: Payer: 59 | Admitting: Occupational Therapy

## 2022-06-13 ENCOUNTER — Ambulatory Visit: Payer: 59

## 2022-06-13 ENCOUNTER — Ambulatory Visit (INDEPENDENT_AMBULATORY_CARE_PROVIDER_SITE_OTHER): Payer: Self-pay

## 2022-06-13 DIAGNOSIS — I714 Abdominal aortic aneurysm, without rupture, unspecified: Secondary | ICD-10-CM

## 2022-06-14 ENCOUNTER — Telehealth: Payer: Self-pay | Admitting: Urology

## 2022-06-14 NOTE — Telephone Encounter (Signed)
Patient called today and is wanting to proceed with Spermatocelectomy. He saw Dr. Erlene Quan in 2022 regarding this. He would like to know "how to get the ball rolling on this". Please advise.

## 2022-06-14 NOTE — Telephone Encounter (Signed)
Does patient need follow up first for OV?

## 2022-06-17 ENCOUNTER — Telehealth: Payer: Self-pay | Admitting: Physical Medicine & Rehabilitation

## 2022-06-17 NOTE — Telephone Encounter (Signed)
Patient called in requesting to know about vivistim evaluation , he said he forgot to mention during visit and would like to know if this can be done for him .

## 2022-06-17 NOTE — Telephone Encounter (Signed)
Please schedule patient for OV

## 2022-06-17 NOTE — Telephone Encounter (Signed)
Yes, needs to be seen in clinic for redo exam and H&P  Hollice Espy, MD

## 2022-06-18 ENCOUNTER — Ambulatory Visit: Payer: 59

## 2022-06-19 ENCOUNTER — Telehealth: Payer: Self-pay | Admitting: Physical Medicine & Rehabilitation

## 2022-06-19 NOTE — Telephone Encounter (Signed)
Insurance says Tizanidine does not require PA.

## 2022-06-19 NOTE — Telephone Encounter (Signed)
Patient is requesting prior Auth for Tizanidine.

## 2022-06-20 ENCOUNTER — Ambulatory Visit: Payer: 59

## 2022-06-20 ENCOUNTER — Telehealth: Payer: Self-pay

## 2022-06-20 NOTE — Telephone Encounter (Signed)
Copied from [place] [id number]. Topic: General - Inquiry >> Jun 20, 2022 10:31 AM Erskine Squibb wrote: Reason for CRM: The patient called in stating he is having botox injections done on the 20th which were scheduled by Dr Tessa Lerner but he states he needs an order to have OT done to be sent to [facility]. This would be for OT twice a week as he is already going once a week but he would like to change in to twice a week. Please assist patient further

## 2022-06-24 DIAGNOSIS — I10 Essential (primary) hypertension: Secondary | ICD-10-CM | POA: Diagnosis not present

## 2022-06-24 DIAGNOSIS — I69351 Hemiplegia and hemiparesis following cerebral infarction affecting right dominant side: Secondary | ICD-10-CM | POA: Diagnosis not present

## 2022-06-24 DIAGNOSIS — M791 Myalgia, unspecified site: Secondary | ICD-10-CM | POA: Diagnosis not present

## 2022-06-24 DIAGNOSIS — G8921 Chronic pain due to trauma: Secondary | ICD-10-CM | POA: Diagnosis not present

## 2022-06-24 DIAGNOSIS — G8929 Other chronic pain: Secondary | ICD-10-CM | POA: Diagnosis not present

## 2022-06-24 DIAGNOSIS — Z79899 Other long term (current) drug therapy: Secondary | ICD-10-CM | POA: Diagnosis not present

## 2022-06-24 DIAGNOSIS — I69322 Dysarthria following cerebral infarction: Secondary | ICD-10-CM | POA: Diagnosis not present

## 2022-06-24 NOTE — Telephone Encounter (Signed)
He has appt for botox on 07/10/22.

## 2022-06-25 ENCOUNTER — Ambulatory Visit: Payer: 59

## 2022-06-25 NOTE — Telephone Encounter (Signed)
Placed in sorter to be faxed

## 2022-06-27 ENCOUNTER — Ambulatory Visit: Payer: 59

## 2022-07-02 ENCOUNTER — Ambulatory Visit: Payer: 59

## 2022-07-04 ENCOUNTER — Ambulatory Visit: Payer: 59

## 2022-07-09 ENCOUNTER — Ambulatory Visit: Payer: 59

## 2022-07-09 ENCOUNTER — Ambulatory Visit: Payer: Medicare Other

## 2022-07-10 ENCOUNTER — Encounter: Payer: 59 | Admitting: Physical Medicine & Rehabilitation

## 2022-07-10 ENCOUNTER — Encounter: Payer: 59 | Attending: Physical Medicine & Rehabilitation | Admitting: Physical Medicine & Rehabilitation

## 2022-07-10 ENCOUNTER — Encounter: Payer: Self-pay | Admitting: Physical Medicine & Rehabilitation

## 2022-07-10 VITALS — BP 113/72 | HR 61 | Temp 98.3°F | Ht 73.0 in | Wt 226.0 lb

## 2022-07-10 DIAGNOSIS — I69398 Other sequelae of cerebral infarction: Secondary | ICD-10-CM | POA: Diagnosis not present

## 2022-07-10 DIAGNOSIS — R252 Cramp and spasm: Secondary | ICD-10-CM | POA: Insufficient documentation

## 2022-07-10 MED ORDER — ONABOTULINUMTOXINA 100 UNITS IJ SOLR
300.0000 [IU] | Freq: Once | INTRAMUSCULAR | Status: AC
  Administered 2022-07-10: 300 [IU] via INTRAMUSCULAR

## 2022-07-10 NOTE — Progress Notes (Signed)
Botox Injection for spasticity of upper extremity using needle EMG guidance Indication: 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   I69.398, R25.2   Dilution: 100 Units/ml        Total Units Injected: 300 Indication: Severe spasticity which interferes with ADL,mobility and/or  hygiene and is unresponsive to medication management and other conservative care Informed consent was obtained after describing risks and benefits of the procedure with the patient. This includes bleeding, bruising, infection, excessive weakness, or medication side effects. A REMS form is on file and signed.  Needle: 60mm injectable monopolar needle electrode  Right  Number of units per muscle Pectoralis Major 0 units Pectoralis Minor 0 units Biceps 0 units Brachioradialis 0 units FCR 25 units FCU 25 units FDS 50 units FDP 50 units FPL 0 units Palmaris Longus 0 units Pronator Teres 125 units Pronator Quadratus 25 units Lumbricals 0 units All injections were done after obtaining appropriate EMG activity and after negative drawback for blood. The patient tolerated the procedure well. Post procedure instructions were given. Return in about 2 months (around 09/09/2022).    Referral made for vivistim evaluation. Pt experiencing some improvement with oral medications.

## 2022-07-10 NOTE — Patient Instructions (Signed)
ALWAYS FEEL FREE TO CALL OUR OFFICE WITH ANY PROBLEMS OR QUESTIONS ([phone])  **PLEASE NOTE** ALL MEDICATION REFILL REQUESTS (INCLUDING CONTROLLED SUBSTANCES) NEED TO BE MADE AT LEAST 7 DAYS PRIOR TO REFILL BEING DUE. ANY REFILL REQUESTS INSIDE THAT TIME FRAME MAY RESULT IN DELAYS IN RECEIVING YOUR PRESCRIPTION.                    

## 2022-07-11 ENCOUNTER — Ambulatory Visit: Payer: Medicare Other

## 2022-07-11 ENCOUNTER — Ambulatory Visit: Payer: 59

## 2022-07-12 ENCOUNTER — Ambulatory Visit: Payer: 59 | Attending: Physician Assistant

## 2022-07-12 DIAGNOSIS — Z8673 Personal history of transient ischemic attack (TIA), and cerebral infarction without residual deficits: Secondary | ICD-10-CM | POA: Diagnosis not present

## 2022-07-12 DIAGNOSIS — M6281 Muscle weakness (generalized): Secondary | ICD-10-CM | POA: Insufficient documentation

## 2022-07-12 DIAGNOSIS — R278 Other lack of coordination: Secondary | ICD-10-CM | POA: Diagnosis not present

## 2022-07-12 NOTE — Therapy (Signed)
OUTPATIENT OCCUPATIONAL THERAPY NEURO RECERTIFICATION/PROGRESS UPDATE AND TREATMENT NOTE Progress reporting period starting 03/01/22-04/09/22   Patient Name: David Reeves MRN: [id number] DOB:February 06, 1971, 52 y.o., male Today's Date: 07/12/2022  PCP: [name], PA-C REFERRING PROVIDER: [name], PA-C     Past Medical History:  Diagnosis Date   Back pain 04/22/2012   Bone spur    Bulging disc 04/22/2012   Degenerative disc disease    Osteoarthritis    Panic anxiety syndrome    Stroke ([place]) 10/2020   Taking multiple medications for chronic disease    Past Surgical History:  Procedure Laterality Date   CYSTECTOMY     head   HERNIA REPAIR Left 2006,2014   [facility]   TOE SURGERY Right 2007   Patient Active Problem List   Diagnosis Date Noted   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 06/12/2022   Alterations of sensations following cerebrovascular accident 03/08/2021   Aphasia as late effect of cerebrovascular accident (CVA) 03/08/2021   Chronic pain syndrome 01/23/2021   Primary hypertension 01/23/2021   Erectile dysfunction due to diseases classified elsewhere 01/23/2021   Cognitive dysfunction 01/23/2021   Elevated LDL cholesterol level 01/23/2021   History of stroke with residual deficit 01/23/2021   Primary insomnia 01/23/2021   Acute ischemic stroke ([place]) 11/10/2020   Closed compression fracture of L1 vertebra ([place]) 05/24/2016   Lumbar stenosis with neurogenic claudication 11/01/2015   Chronic bilateral low back pain with right-sided sciatica 11/08/2014   Hx of hemorrhoids 11/08/2014   Chronic pain associated with significant psychosocial dysfunction 09/22/2014   Panic disorder 09/22/2014   AB (asthmatic bronchitis) 08/17/2014   Anxiety disorder due to general medical condition 08/17/2014   Lumbosacral spondylosis without myelopathy 08/17/2014   Brash 08/17/2014   Episodic paroxysmal anxiety disorder 08/17/2014   Hernia, inguinal, right 08/17/2014   Inguinal  hernia 10/14/2012   ONSET DATE: 11/09/2020  REFERRING DIAG: CVA  THERAPY DIAG:  No diagnosis found.  Rationale for Evaluation and Treatment Rehabilitation  PERTINENT HISTORY: Pt. is a 52 y.o. who was diagnosed with a CVA on July 21st, 2022. Pt. completed several weeks of  inpatient rehab at [facility]. After returning to home, Pt. sustained a fall in december of 2022, and was admitted to the hospital with COVID-19, and back pain from the fall. Since the most recent discharge, Pt. has been residing with the ex-wife until the Pt. is ready to return to independent living. Pt. PMHx includes: Bilateral LBP with right sided sciatica, Lumbosacral spondylosis myelopathy, closed compression Fx of L1. Pt. enjoys cooking, and riding motorcycles.   PRECAUTIONS: Fall  SUBJECTIVE: Pt reports doing well today.  Pt stated that he understands spasticity has been limiting progress with use of the R hand and pt is receptive to neurology appointment to see if Botox may be helpful.   PAIN:  Are you having pain? Yes: NPRS scale: 2/10 Pain location: low back Pain description: aching Aggravating factors: prolonged sitting or lying down Relieving factors: rest, heat, walking  [facility] OT Assessment        12/11/21 01/22/22 03/01/22 04/09/22 07/12/22               Observation/Other Assessments        Focus on Therapeutic Outcomes (FOTO)   46 50 49 49 56               Coordination        Right 9 Hole Peg Test unable  unable unable Unable (was able to remove 1 peg  from hole but unable to pick up peg from dish)     Left 9 Hole Peg Test 30 sec  30 sec NT NT (normal)                Hand Function        Right Hand Grip (lbs) 35 (3rd setting) 30 lb (3rd);  41 lb (4th setting),  44 lbs (4th setting) 46 lbs (4th setting) 30 lbs     Right Hand Lateral Pinch 9 lbs   slipping 12 lbs (thumb slips) 16 lbs (multiple trials d/t thumb slips) 4 lbs (multiple trials d/t thumb slips) 14 lbs    Right Hand 3 Point Pinch   unable to position  thumb to produce 3 pt pinch Unable to position thumb to produce 3 point pinch unable Unable (fingers slip) unable    Left Hand Grip (lbs) 116         Left Hand Lateral Pinch 29 lbs         Left 3 point pinch 27 lbs         OBJECTIVE:  TODAY'S TREATMENT:  Therapeutic Exercise: Objective measures taken and goals updated for progress update/recertification.  OT reviewed HEP d/t plan to place pt on hold until pt can follow up with neurology for possible Botox tx to manage spasticity in the RUE.  OT reinforced ongoing thumb ext and abd exercises, 3 point pinching working with blocks and pegs, gross grasping/releasing with focus on delaying thumb flexion to grip items without thumb tucked under fingers.  Pt is able to return demo of above exercises.    Neuro re-ed: Practiced R hand 3 point pinch patterns placing and removing ball pegs from pegboard.  Pt required repeated repositioning of fingers on pegs to prevent fingers from slipping and vc to engage the R LF tip into prehension on the peg.    PATIENT EDUCATION: Education details: HEP review Person educated: Patient Education method: Explanation Education comprehension: verbalized and demonstrated understanding.      OT Short Term Goals - 6 weeks      OT SHORT TERM GOAL #1   Title Pt. wil demonstrtae independence with HEPs    Baseline Eval: Pt. currently does not have one, 10th visit:  changes in HEP ongoing; 20th: instructed in putty exercises, changes ongoing; 40th: continual updates to HEP as pt progresses; 50th: continual updates; 01/22/22: continual updates; 03/01/22: continual updates; 04/09/22: pt demo indep with current hand strengthening and coordination exercises; will adjust as needed if pt is able to schedule Botox injections   Time 6    Period Weeks    Status On-going    Target Date 05/21/22            OT Long Term Goals - 12 weeks      OT LONG TERM GOAL #1   Title Pt. will improve RUE strength by 2 mm grades to assist  with ADLs, and IADLs,    Baseline Eval: Right shoulder flexion, abduction 3/5, elbow flexion, extension wrist extension 3+/5, 10th visit: improving with RUE strength by not yet met goal; 20th visit: R shd flex/abd 4-, elbow flex/ext 4+, wrist flex 4-, wrist ext 4+; 40:  R shd flex/abd 4,  elbow flex/ext 4+, wrist flex 4, wrist ext 4+; 50th visit 8/22: R shoulder flex/abd 4/5, elbow flex/ext 5/5, wrist flex 4, wrist ext 4+; 01/22/22: shoulder flex/abd 4/5, elbow flex/ext 5/5, wrist flex 4, wrist ext 4+; 03/01/22: same as 01/22/22   Time 12  Period Weeks    Status On-going    Target Date 07/02/22     OT LONG TERM GOAL #2   Title Pt. will improve right grip by 10 lbs to prepare for firmly holding objects for IADLs.    Baseline Eval: R: 38#, L: 124# (In 3rd dynamometer slot); R 35# in 4th slot, 28# in 3rd slot, 40th: 47 in 4th slot; 50th: 12/11/21: 46# on 3rd slot; 01/22/22: 30# on 3rd slot; 03/01/22: 44# 4th slot; 04/09/22: 46#   Time 12    Period Weeks    Status On-going    Target Date 07/02/22     OT LONG TERM GOAL #3   Title Pt. will improve right lateral pinch strength by 5# to assist with cutting food    Baseline Eval: R: 17, L: 25; 20th: R: 18; 40th: 15# fingers slipping on meter, 50th: 9# fingers slipping; 01/22/22: 12# fingers slipping; 03/01/22: 16#; 04/09/22: 5# thumb slipping    Time 12    Period Weeks    Status On-going    Target Date 07/02/22     OT LONG TERM GOAL #4   Title Pt. will improve Right 3pt pinch by 2# to be able to hold/open items for cooking    Baseline Eval: R: Pt. unable to engage thumb L: 29#; 20th: R unable; 40th:  unable, fingers slipping; 50th:  unable, fingers slipping; 01/22/22: fingers slipping but pt is now able to grasp a ball peg with 3 point pinch pattern; 03/01/22: same as 10/3 in addition to being able to pick up ball peg from pegboard then place peg back into pegboard with a 3 point pinch pattern; 04/10/23: pt can pick up a ball peg from pegboard and place  back into peg board using a 3 point pinch pattern, but when challenged to apply any force with this pinch, fingers slip from position. Unable to register a 3 point pinch on pinch gauge.   Time 12    Period Weeks    Status On-going    Target Date 07/02/22     OT LONG TERM GOAL #5   Title Pt. will improve right hand [facility] skills to be able to independently manipulate buttons, and zippers.    Baseline Eval: Pt. has difficulty managing buttons,a nd zippers. 10th visit:  improving; 20th: unable to engage R hand effectively, performs 1 handed with L; 50th: performs L handed only; 01/22/22: pt can use R hand as a stabilizer with clothing fasteners with mod vc; 03/01/22: same as 01/22/22; 04/09/22: Pt uses R hand as a stabilizer with mod vc   Time 12    Period Weeks    Status On-going    Target Date 07/02/22     OT LONG TERM GOAL #6   Title Pt. will independently write his name    Baseline Eval: Pt. is unable to hold a pen; 20th: pt can grip PenAgain with R hand and can make marks on paper with difficulty, but not yet writing name; 40th:  continues to demonstrate difficulty with grasp of pen; 50th 12/11/21: With assist to set up hand on pen again, pt was able to write first name and initial of last name in 1 1/4" tall letters with fair legibility (pt wants to defer this goal, states this is low on his list of priorities for now)   Time 12    Period Weeks    Status Defer   Target Date 04/16/22      OT LONG TERM GOAL #7  Title Pt. will improve FOTO score by 2 points to reflect funational improvement    Baseline Eval: FOTO score 46 with TR score 52; 20th: 45; 40th: 60;  50th: 46; 01/22/22: 50; 03/01/22: 49; 04/09/22: 49   Time 12    Period Weeks    Status On-going    Target Date 07/02/22            Plan -    Clinical Impression Statement Pt seen for 80th visit progress update and recertification.  Planning to place pt on hold d/t limited functional gains over the last several updates.  Spasticity  throughout the RUE limiting progression of FMC/dexterity skills.  OT communicated with pt's PA at PCP office with plan to get pt a referral to Dr. Naaman Plummer for assessing appropriateness for Botox injections for the RUE.  Encouraged pt also inquire whether he may be a good candidate for Vivistim.  Pt has been encouraged to continue with his current HEP, and will re-start OT following Botox injections as appropriate.   OT Occupational Profile and History Detailed Assessment- Review of Records and additional review of physical, cognitive, psychosocial history related to current functional performance    Occupational performance deficits (Please refer to evaluation for details): ADL's;IADL's;Education    Rehab Potential Good    Clinical Decision Making Several treatment options, min-mod task modification necessary    Comorbidities Affecting Occupational Performance: May have comorbidities impacting occupational performance    Modification or Assistance to Complete Evaluation  Min-Moderate modification of tasks or assist with assess necessary to complete eval    OT Frequency 2x / week    OT Duration 12 weeks    OT Treatment/Interventions Self-care/ADL training;DME and/or AE instruction;Therapeutic exercise;Ultrasound;Neuromuscular education;Therapeutic activities;Energy conservation;Moist Heat;Patient/family education;Splinting;Functional Mobility Training;Paraffin    Plan Neuro re-ed and therapeutic exercise    Consulted and Agree with Plan of Care Patient             Leta Speller, MS, OTR/L   Darleene Cleaver, OT 07/12/2022, 9:20 AM

## 2022-07-15 ENCOUNTER — Ambulatory Visit: Payer: 59

## 2022-07-15 DIAGNOSIS — M6281 Muscle weakness (generalized): Secondary | ICD-10-CM

## 2022-07-15 DIAGNOSIS — Z8673 Personal history of transient ischemic attack (TIA), and cerebral infarction without residual deficits: Secondary | ICD-10-CM

## 2022-07-15 DIAGNOSIS — R278 Other lack of coordination: Secondary | ICD-10-CM | POA: Diagnosis not present

## 2022-07-15 NOTE — Therapy (Signed)
OUTPATIENT OCCUPATIONAL THERAPY NEURO TREATMENT NOTE  Patient Name: David Reeves MRN: [id number] DOB:09/25/1970, 52 y.o., male Today's Date: 07/15/2022  PCP: [name], PA-C REFERRING PROVIDER: [name], PA-C   OT End of Session - 07/15/22 1033     Visit Number 2    Number of Visits 24    Date for OT Re-Evaluation 10/04/22    Authorization Type Reporting period beginning 07/12/22    Progress Note Due on Visit 10    OT Start Time 0845    OT Stop Time 0930    OT Time Calculation (min) 45 min    Activity Tolerance Patient tolerated treatment well    Behavior During Therapy [facility] for tasks assessed/performed             Past Medical History:  Diagnosis Date   Back pain 04/22/2012   Bone spur    Bulging disc 04/22/2012   Degenerative disc disease    Osteoarthritis    Panic anxiety syndrome    Stroke ([place]) 10/2020   Taking multiple medications for chronic disease    Past Surgical History:  Procedure Laterality Date   CYSTECTOMY     head   HERNIA REPAIR Left 2006,2014   [facility]   TOE SURGERY Right 2007   Patient Active Problem List   Diagnosis Date Noted   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 06/12/2022   Alterations of sensations following cerebrovascular accident 03/08/2021   Aphasia as late effect of cerebrovascular accident (CVA) 03/08/2021   Chronic pain syndrome 01/23/2021   Primary hypertension 01/23/2021   Erectile dysfunction due to diseases classified elsewhere 01/23/2021   Cognitive dysfunction 01/23/2021   Elevated LDL cholesterol level 01/23/2021   History of stroke with residual deficit 01/23/2021   Primary insomnia 01/23/2021   Acute ischemic stroke ([place]) 11/10/2020   Closed compression fracture of L1 vertebra ([place]) 05/24/2016   Lumbar stenosis with neurogenic claudication 11/01/2015   Chronic bilateral low back pain with right-sided sciatica 11/08/2014   Hx of hemorrhoids 11/08/2014   Chronic pain associated with significant psychosocial  dysfunction 09/22/2014   Panic disorder 09/22/2014   AB (asthmatic bronchitis) 08/17/2014   Anxiety disorder due to general medical condition 08/17/2014   Lumbosacral spondylosis without myelopathy 08/17/2014   Brash 08/17/2014   Episodic paroxysmal anxiety disorder 08/17/2014   Hernia, inguinal, right 08/17/2014   Inguinal hernia 10/14/2012   ONSET DATE: 11/09/2020  REFERRING DIAG: CVA  THERAPY DIAG:  Muscle weakness (generalized)  Other lack of coordination  H/O ischemic left MCA stroke  Rationale for Evaluation and Treatment Rehabilitation  PERTINENT HISTORY: Pt returns today for re-eval following first round of Botox for RUE spasticity; injections done on 07/11/22.  Pt had reached plateau with OT goals by mid Dec of 2023 as spasticity was limiting progression of coordination/dexterity skills in the R hand.  Pt planning to trial Botox with a second treatment planned 3 months out.  Pt is also in the process of being evaluated for Vivistim, but has not yet had the Fugl-Meyer Assessment in [place].  Awaiting insurance authorization for this.  Pt. is a 52 y.o. who was diagnosed with a CVA on July 21st, 2022. Pt. completed several weeks of  inpatient rehab at [facility]. After returning to home, Pt. sustained a fall in december of 2022, and was admitted to the hospital with COVID-19, and back pain from the fall. Since the most recent discharge, Pt. has been residing with the ex-wife until the Pt. is ready to return to independent  living. Pt. PMHx includes: Bilateral LBP with right sided sciatica, Lumbosacral spondylosis myelopathy, closed compression Fx of L1. Pt. enjoys cooking, and riding motorcycles.   PRECAUTIONS: Fall  SUBJECTIVE: Pt reports doing excellent today. PAIN:  Are you having pain? Yes: NPRS scale: 1/10 Pain location: low back Pain description: aching Aggravating factors: prolonged sitting or lying down Relieving factors: rest, heat, walking  [facility] OT Assessment         12/11/21 01/22/22 03/01/22 04/09/22 07/12/22               Observation/Other Assessments        Focus on Therapeutic Outcomes (FOTO)   46 50 49 49 56               Coordination        Right 9 Hole Peg Test unable  unable unable Unable (was able to remove 1 peg from hole but unable to pick up peg from dish) unable    Left 9 Hole Peg Test 30 sec  30 sec NT NT (WNL) NT (WNL)               Hand Function        Right Hand Grip (lbs) 35 (3rd setting) 30 lb (3rd);  41 lb (4th setting),  44 lbs (4th setting) 46 lbs (4th setting) 30 lbs (4th setting)    Right Hand Lateral Pinch 9 lbs   slipping 12 lbs (thumb slips) 16 lbs (multiple trials d/t thumb slips) 4 lbs (multiple trials d/t thumb slips) 14 lbs (Saehan)    Right Hand 3 Point Pinch   unable to position thumb to produce 3 pt pinch Unable to position thumb to produce 3 point pinch unable Unable (fingers slip) unable    Left Hand Grip (lbs) 116     95    Left Hand Lateral Pinch 29 lbs     31    Left 3 point pinch 27 lbs    32     OBJECTIVE:  TODAY'S TREATMENT:  Therapeutic Exercise: Pt performed closed chain RUE distal strengthening exercises using yellow looped theraband around the R hand to complete 2 sets 10 reps each of wrist ext, wrist flex, RD, UD, forearm pron, and sup.  OT provided stabilization to elbow and forearm to isolate wrist and hand during above noted exercises.  Pt required min-mod vc and tactile cues for form and technique.  Performed active assisted digit abduction exercises, OT also assisting to stabilize wrist and the remaining digits not being opposed to thumb while providing min guard to guide individual tip of digit to thumb x2 sets 7 reps each.  Performed 2 sets 7 reps of active assisted digit abd/add, requiring min guard to prevent R IF flexion, maintaining palm flat on table.    Neuro re-ed: Facilitated 3 point pinch prehension patterns working to grasp a piece of paper between thumb and 2nd and 3rd digit.  Pt required  initial min A to set up fingers to formulate this pinch pattern.  Pt practiced holding paper with this grasp and moving arm in various planes without dropping paper.  Practiced same pinch pattern holding on to cell phone (resting upright on table top) and fat high-lighter marker.  Pt required stabilization at the R thumb IP joint to prevent IP flexion which causes the marker to slip from his fingers.  Pt practiced a light pinch around the marker with OT assisting to maintain finger position around mark.  Encouraged pt practice this  pinch pattern at home with household items and using L hand to set up R hand and provide stabilization as noted above.    PATIENT EDUCATION: Education details: RUE Equities trader,  Person educated: Patient Education method: Explanation Education comprehension: verbalized and demonstrated understanding.      OT Short Term Goals - 6 weeks (08/23/22)      OT SHORT TERM GOAL #1   Title Pt. wil demonstrtae independence with HEPs    Baseline Eval: Pt. currently does not have one, 10th visit:  changes in HEP ongoing; 20th: instructed in putty exercises, changes ongoing; 40th: continual updates to HEP as pt progresses; 50th: continual updates; 01/22/22: continual updates; 03/01/22: continual updates; 04/09/22: pt demo indep with current hand strengthening and coordination exercises; will adjust as needed if pt is able to schedule Botox injections  Re-eval 07/12/22: will progress with any changes noted with Botox, currently working on active assisted ROM in all digits and digit opposition   Time 6    Period Weeks    Status On-going    Target Date 08/23/22            OT Long Term Goals - 12 weeks (10/04/22)      OT LONG TERM GOAL #1   Title Pt. will improve RUE strength by 2 mm grades to assist with ADLs, and IADLs Revised 07/12/22: Pt will improve R forearm and wrist strength by 1 MM grade in order to turn a door knob with the R hand.   Baseline Eval: Right shoulder  flexion, abduction 3/5, elbow flexion, extension wrist extension 3+/5, 10th visit: improving with RUE strength by not yet met goal; 20th visit: R shd flex/abd 4-, elbow flex/ext 4+, wrist flex 4-, wrist ext 4+; 40:  R shd flex/abd 4,  elbow flex/ext 4+, wrist flex 4, wrist ext 4+; 50th visit 8/22: R shoulder flex/abd 4/5, elbow flex/ext 5/5, wrist flex 4, wrist ext 4+; 01/22/22: shoulder flex/abd 4/5, elbow flex/ext 5/5, wrist flex 4, wrist ext 4+; 03/01/22: same as 01/22/22  Re-eval 07/12/22: R shoulder flex/abd 4/5, elbow flex/ext 5/5, forearm sup 3-, wrist ext 3+, wrist flex NT (limited by tone)   Time 12    Period Weeks    Status On-going    Target Date 10/04/22     OT LONG TERM GOAL #2   Title Pt. will improve right grip by 10 lbs to prepare for firmly holding objects for IADLs.    Baseline Eval: R: 38#, L: 124# (In 3rd dynamometer slot); R 35# in 4th slot, 28# in 3rd slot, 40th: 47 in 4th slot; 50th: 12/11/21: 46# on 3rd slot; 01/22/22: 30# on 3rd slot; 03/01/22: 44# 4th slot; 04/09/22: 46#  Re-eval 07/12/22: R grip 30#   Time 12    Period Weeks    Status On-going    Target Date 6/14//24     OT LONG TERM GOAL #3   Title Pt. will improve right lateral pinch strength by 5# to assist with cutting food  Revised 07/12/22: increase by 3# or more    Baseline Eval: R: 17, L: 25; 20th: R: 18; 40th: 15# fingers slipping on meter, 50th: 9# fingers slipping; 01/22/22: 12# fingers slipping; 03/01/22: 16#; 04/09/22: 5# thumb slipping  Re-eval 07/12/22: R 14# (Saehan gauge)   Time 12    Period Weeks    Status On-going    Target Date 10/04/22     OT LONG TERM GOAL #4   Title Pt. will improve Right 3pt pinch by  2# to be able to hold/open items for cooking    Baseline Eval: R: Pt. unable to engage thumb L: 29#; 20th: R unable; 40th:  unable, fingers slipping; 50th:  unable, fingers slipping; 01/22/22: fingers slipping but pt is now able to grasp a ball peg with 3 point pinch pattern; 03/01/22: same as 10/3 in  addition to being able to pick up ball peg from pegboard then place peg back into pegboard with a 3 point pinch pattern; 04/10/23: pt can pick up a ball peg from pegboard and place back into peg board using a 3 point pinch pattern, but when challenged to apply any force with this pinch, fingers slip from position. Unable to register a 3 point pinch on pinch gauge.  Re-eval 07/12/22: unable   Time 12    Period Weeks    Status On-going    Target Date 6/14//24     OT LONG TERM GOAL #5   Title Pt. will improve right hand [facility] skills to be able to independently manipulate buttons, and zippers.  Revised 07/12/22: Pt will use R hand as a good stabilizer to manage clothing fasteners with increased time.   Baseline Eval: Pt. has difficulty managing buttons,a nd zippers. 10th visit:  improving; 20th: unable to engage R hand effectively, performs 1 handed with L; 50th: performs L handed only; 01/22/22: pt can use R hand as a stabilizer with clothing fasteners with mod vc; 03/01/22: same as 01/22/22; 04/09/22: Pt uses R hand as a stabilizer with mod vc  Re-eval 07/12/22: pt manages clothing fasteners with L hand only   Time 12    Period Weeks    Status On-going    Target Date 10/04/22     OT LONG TERM GOAL #6   Title Pt. will independently write his name    Baseline Eval: Pt. is unable to hold a pen; 20th: pt can grip PenAgain with R hand and can make marks on paper with difficulty, but not yet writing name; 40th:  continues to demonstrate difficulty with grasp of pen; 50th 12/11/21: With assist to set up hand on pen again, pt was able to write first name and initial of last name in 1 1/4" tall letters with fair legibility (pt wants to defer this goal, states this is low on his list of priorities for now)   Time 12    Period Weeks    Status D/c (no longer a priority for pt)   Target Date 04/16/22      OT LONG TERM GOAL #7   Title Pt. will improve FOTO score by 2 points to reflect funational improvement     Baseline Eval: FOTO score 46 with TR score 52; 20th: 45; 40th: 44;  50th: 46; 01/22/22: 50; 03/01/22: 49; 04/09/22: 49  Re-eval 07/12/22: 56; predicted 58   Time 12    Period Weeks    Status On-going    Target Date 10/04/22            Plan -    Clinical Impression Statement Good tolerance to closed chain yellow theraband exercises today for R wrist and forearm strengthening.  Though ROM arcs are small using theraband, especially for forearm pron/sup and RD/UD, pt benefited from the proprioceptive input that the band provided.  OT provided stabilization to elbow and forearm to isolate wrist and hand during above noted exercises.  Began focus on R hand 3 point pinch pattern. Pt practiced a light pinch around the marker with OT assisting to  maintain finger position around marker.  Pt required stabilization at the R thumb IP joint to prevent IP flexion which causes the marker to slip from his fingers.  Encouraged pt practice this pinch pattern at home with household items and using L hand to set up R hand and provide stabilization as noted above.  Pt will continue to benefit from skilled OT to maximize strength and dexterity skills in the RUE in combination with Botox injections in order to maximize functional use of the RUE for daily tasks.      OT Occupational Profile and History Detailed Assessment- Review of Records and additional review of physical, cognitive, psychosocial history related to current functional performance    Occupational performance deficits (Please refer to evaluation for details): ADL's;IADL's;Education    Rehab Potential Good    Clinical Decision Making Several treatment options, min-mod task modification necessary    Comorbidities Affecting Occupational Performance: May have comorbidities impacting occupational performance    Modification or Assistance to Complete Evaluation  Min-Moderate modification of tasks or assist with assess necessary to complete eval    OT Frequency  2x / week    OT Duration 12 weeks    OT Treatment/Interventions Self-care/ADL training;DME and/or AE instruction;Therapeutic exercise;Ultrasound;Neuromuscular education;Therapeutic activities;Energy conservation;Moist Heat;Patient/family education;Splinting;Functional Mobility Training;Paraffin    Plan Neuro re-ed and therapeutic exercise    Consulted and Agree with Plan of Care Patient             Leta Speller, MS, OTR/L   Darleene Cleaver, OT 07/15/2022, 10:35 AM

## 2022-07-16 ENCOUNTER — Ambulatory Visit: Payer: Medicare Other

## 2022-07-16 ENCOUNTER — Ambulatory Visit: Payer: 59

## 2022-07-18 ENCOUNTER — Ambulatory Visit: Payer: 59

## 2022-07-18 ENCOUNTER — Ambulatory Visit: Payer: Medicare Other

## 2022-07-18 DIAGNOSIS — M6281 Muscle weakness (generalized): Secondary | ICD-10-CM

## 2022-07-18 DIAGNOSIS — Z8673 Personal history of transient ischemic attack (TIA), and cerebral infarction without residual deficits: Secondary | ICD-10-CM

## 2022-07-18 DIAGNOSIS — R278 Other lack of coordination: Secondary | ICD-10-CM | POA: Diagnosis not present

## 2022-07-19 NOTE — Therapy (Signed)
OUTPATIENT OCCUPATIONAL THERAPY NEURO TREATMENT NOTE  Patient Name: David Reeves MRN: [id number] DOB:[birth year], 52 y.o., male Today's Date: 07/19/2022  PCP: [name], PA-C REFERRING PROVIDER: [name], PA-C   OT End of Session - 07/19/22 1126     Visit Number 3    Number of Visits 24    Date for OT Re-Evaluation 10/04/22    Authorization Type Reporting period beginning 07/12/22    Progress Note Due on Visit 10    OT Start Time 104    OT Stop Time [id number]    OT Time Calculation (min) 65 min    Activity Tolerance Patient tolerated treatment well    Behavior During Therapy [facility] for tasks assessed/performed             Past Medical History:  Diagnosis Date   Back pain 04/22/2012   Bone spur    Bulging disc 04/22/2012   Degenerative disc disease    Osteoarthritis    Panic anxiety syndrome    Stroke ([place]) 10/2020   Taking multiple medications for chronic disease    Past Surgical History:  Procedure Laterality Date   CYSTECTOMY     head   HERNIA REPAIR Left 2006,2014   [facility]   TOE SURGERY Right 2007   Patient Active Problem List   Diagnosis Date Noted   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 06/12/2022   Alterations of sensations following cerebrovascular accident 03/08/2021   Aphasia as late effect of cerebrovascular accident (CVA) 03/08/2021   Chronic pain syndrome 01/23/2021   Primary hypertension 01/23/2021   Erectile dysfunction due to diseases classified elsewhere 01/23/2021   Cognitive dysfunction 01/23/2021   Elevated LDL cholesterol level 01/23/2021   History of stroke with residual deficit 01/23/2021   Primary insomnia 01/23/2021   Acute ischemic stroke ([place]) 11/10/2020   Closed compression fracture of L1 vertebra ([place]) 05/24/2016   Lumbar stenosis with neurogenic claudication 11/01/2015   Chronic bilateral low back pain with right-sided sciatica 11/08/2014   Hx of hemorrhoids 11/08/2014   Chronic pain associated with significant psychosocial  dysfunction 09/22/2014   Panic disorder 09/22/2014   AB (asthmatic bronchitis) 08/17/2014   Anxiety disorder due to general medical condition 08/17/2014   Lumbosacral spondylosis without myelopathy 08/17/2014   Brash 08/17/2014   Episodic paroxysmal anxiety disorder 08/17/2014   Hernia, inguinal, right 08/17/2014   Inguinal hernia 10/14/2012   ONSET DATE: 11/09/2020  REFERRING DIAG: CVA  THERAPY DIAG:  Muscle weakness (generalized)  Other lack of coordination  H/O ischemic left MCA stroke  Rationale for Evaluation and Treatment Rehabilitation  PERTINENT HISTORY: Pt returns today for re-eval following first round of Botox for RUE spasticity; injections done on 07/11/22.  Pt had reached plateau with OT goals by mid Dec of 2023 as spasticity was limiting progression of coordination/dexterity skills in the R hand.  Pt planning to trial Botox with a second treatment planned 3 months out.  Pt is also in the process of being evaluated for Vivistim, but has not yet had the Fugl-Meyer Assessment in [place].  Awaiting insurance authorization for this.  Pt. is a 52 y.o. who was diagnosed with a CVA on July 21st, 2022. Pt. completed several weeks of  inpatient rehab at [facility]. After returning to home, Pt. sustained a fall in december of 2022, and was admitted to the hospital with COVID-19, and back pain from the fall. Since the most recent discharge, Pt. has been residing with the ex-wife until the Pt. is ready to return to independent  living. Pt. PMHx includes: Bilateral LBP with right sided sciatica, Lumbosacral spondylosis myelopathy, closed compression Fx of L1. Pt. enjoys cooking, and riding motorcycles.   PRECAUTIONS: Fall  SUBJECTIVE: Pt reports he is starting to feel the benefits from his recent Botox injections. PAIN:  Are you having pain? Yes: NPRS scale: 3/10 Pain location: low back Pain description: aching Aggravating factors: prolonged sitting or lying down Relieving factors: rest,  heat, walking  [facility] OT Assessment        12/11/21 01/22/22 03/01/22 04/09/22 07/12/22               Observation/Other Assessments        Focus on Therapeutic Outcomes (FOTO)   46 50 49 49 56               Coordination        Right 9 Hole Peg Test unable  unable unable Unable (was able to remove 1 peg from hole but unable to pick up peg from dish) unable    Left 9 Hole Peg Test 30 sec  30 sec NT NT (WNL) NT (WNL)               Hand Function        Right Hand Grip (lbs) 35 (3rd setting) 30 lb (3rd);  41 lb (4th setting),  44 lbs (4th setting) 46 lbs (4th setting) 30 lbs (4th setting)    Right Hand Lateral Pinch 9 lbs   slipping 12 lbs (thumb slips) 16 lbs (multiple trials d/t thumb slips) 4 lbs (multiple trials d/t thumb slips) 14 lbs (Saehan)    Right Hand 3 Point Pinch   unable to position thumb to produce 3 pt pinch Unable to position thumb to produce 3 point pinch unable Unable (fingers slip) unable    Left Hand Grip (lbs) 116     95    Left Hand Lateral Pinch 29 lbs     31    Left 3 point pinch 27 lbs    32     OBJECTIVE:  TODAY'S TREATMENT:  Neuro re-ed: Initiated first trial of the Fugl-Meyer to assess RUE function and potential readiness for Vivistim evaluation in [place].    Motor Function Findings A-D (34/66): A. UE 22/36 B. Wrist 6/10 C. Hand 6/14 D. Coordination/speed 0/6  H. Sensation 8/12 I. Passive Joint Motion 21/24 J. Joint pain 24/24   PATIENT EDUCATION: Education details: RUE function and potential readiness for Vivistim assessment Person educated: Patient Education method: Explanation Education comprehension: verbalized and demonstrated understanding.      OT Short Term Goals - 6 weeks (08/23/22)      OT SHORT TERM GOAL #1   Title Pt. wil demonstrtae independence with HEPs    Baseline Eval: Pt. currently does not have one, 10th visit:  changes in HEP ongoing; 20th: instructed in putty exercises, changes ongoing; 40th: continual updates to HEP as pt  progresses; 50th: continual updates; 01/22/22: continual updates; 03/01/22: continual updates; 04/09/22: pt demo indep with current hand strengthening and coordination exercises; will adjust as needed if pt is able to schedule Botox injections  Re-eval 07/12/22: will progress with any changes noted with Botox, currently working on active assisted ROM in all digits and digit opposition   Time 6    Period Weeks    Status On-going    Target Date 08/23/22            OT Long Term Goals - 12 weeks (10/04/22)  OT LONG TERM GOAL #1   Title Pt. will improve RUE strength by 2 mm grades to assist with ADLs, and IADLs Revised 07/12/22: Pt will improve R forearm and wrist strength by 1 MM grade in order to turn a door knob with the R hand.   Baseline Eval: Right shoulder flexion, abduction 3/5, elbow flexion, extension wrist extension 3+/5, 10th visit: improving with RUE strength by not yet met goal; 20th visit: R shd flex/abd 4-, elbow flex/ext 4+, wrist flex 4-, wrist ext 4+; 40:  R shd flex/abd 4,  elbow flex/ext 4+, wrist flex 4, wrist ext 4+; 50th visit 8/22: R shoulder flex/abd 4/5, elbow flex/ext 5/5, wrist flex 4, wrist ext 4+; 01/22/22: shoulder flex/abd 4/5, elbow flex/ext 5/5, wrist flex 4, wrist ext 4+; 03/01/22: same as 01/22/22  Re-eval 07/12/22: R shoulder flex/abd 4/5, elbow flex/ext 5/5, forearm sup 3-, wrist ext 3+, wrist flex NT (limited by tone)   Time 12    Period Weeks    Status On-going    Target Date 10/04/22     OT LONG TERM GOAL #2   Title Pt. will improve right grip by 10 lbs to prepare for firmly holding objects for IADLs.    Baseline Eval: R: 38#, L: 124# (In 3rd dynamometer slot); R 35# in 4th slot, 28# in 3rd slot, 40th: 47 in 4th slot; 50th: 12/11/21: 46# on 3rd slot; 01/22/22: 30# on 3rd slot; 03/01/22: 44# 4th slot; 04/09/22: 46#  Re-eval 07/12/22: R grip 30#   Time 12    Period Weeks    Status On-going    Target Date 6/14//24     OT LONG TERM GOAL #3   Title Pt.  will improve right lateral pinch strength by 5# to assist with cutting food  Revised 07/12/22: increase by 3# or more    Baseline Eval: R: 17, L: 25; 20th: R: 18; 40th: 15# fingers slipping on meter, 50th: 9# fingers slipping; 01/22/22: 12# fingers slipping; 03/01/22: 16#; 04/09/22: 5# thumb slipping  Re-eval 07/12/22: R 14# (Saehan gauge)   Time 12    Period Weeks    Status On-going    Target Date 10/04/22     OT LONG TERM GOAL #4   Title Pt. will improve Right 3pt pinch by 2# to be able to hold/open items for cooking    Baseline Eval: R: Pt. unable to engage thumb L: 29#; 20th: R unable; 40th:  unable, fingers slipping; 50th:  unable, fingers slipping; 01/22/22: fingers slipping but pt is now able to grasp a ball peg with 3 point pinch pattern; 03/01/22: same as 10/3 in addition to being able to pick up ball peg from pegboard then place peg back into pegboard with a 3 point pinch pattern; 04/10/23: pt can pick up a ball peg from pegboard and place back into peg board using a 3 point pinch pattern, but when challenged to apply any force with this pinch, fingers slip from position. Unable to register a 3 point pinch on pinch gauge.  Re-eval 07/12/22: unable   Time 12    Period Weeks    Status On-going    Target Date 6/14//24     OT LONG TERM GOAL #5   Title Pt. will improve right hand [facility] skills to be able to independently manipulate buttons, and zippers.  Revised 07/12/22: Pt will use R hand as a good stabilizer to manage clothing fasteners with increased time.   Baseline Eval: Pt. has difficulty managing buttons,a nd zippers.  10th visit:  improving; 20th: unable to engage R hand effectively, performs 1 handed with L; 50th: performs L handed only; 01/22/22: pt can use R hand as a stabilizer with clothing fasteners with mod vc; 03/01/22: same as 01/22/22; 04/09/22: Pt uses R hand as a stabilizer with mod vc  Re-eval 07/12/22: pt manages clothing fasteners with L hand only   Time 12    Period Weeks     Status On-going    Target Date 10/04/22     OT LONG TERM GOAL #6   Title Pt. will independently write his name    Baseline Eval: Pt. is unable to hold a pen; 20th: pt can grip PenAgain with R hand and can make marks on paper with difficulty, but not yet writing name; 40th:  continues to demonstrate difficulty with grasp of pen; 50th 12/11/21: With assist to set up hand on pen again, pt was able to write first name and initial of last name in 1 1/4" tall letters with fair legibility (pt wants to defer this goal, states this is low on his list of priorities for now)   Time 12    Period Weeks    Status D/c (no longer a priority for pt)   Target Date 04/16/22      OT LONG TERM GOAL #7   Title Pt. will improve FOTO score by 2 points to reflect funational improvement    Baseline Eval: FOTO score 46 with TR score 52; 20th: 45; 40th: 44;  50th: 46; 01/22/22: 50; 03/01/22: 49; 04/09/22: 49  Re-eval 07/12/22: 56; predicted 58   Time 12    Period Weeks    Status On-going    Target Date 10/04/22            Plan -    Clinical Impression Statement Initiated first trial of the Fugl-Meyer to assess RUE function and potential readiness for Vivistim evaluation in [place].  Pt should score at least a 20 to be a potential candidate.  Initial findings estimate 34/66 for motor function.  Spasticity in the forearm, wrist and hand limit full ROM to achieve the highest score (2/2 in each category) and also limits volitional movement without compensatory patterns which also limits ability to achieve a 2/2.  Partial credit 1 instead of 2 given for most categories assessing volitional movement.  Coordination and speed for tip of index finger from knee to nose 5x marked 0 as speed was >6 seconds more than the L side.  Pt reports that he's starting to see some benefit from his recent Botox injections.  Pt will continue to benefit from skilled OT to maximize strength and dexterity skills in the RUE in combination with  Botox injections in order to maximize functional use of the RUE for daily tasks and prepare for Vivistim evaluation.      OT Occupational Profile and History Detailed Assessment- Review of Records and additional review of physical, cognitive, psychosocial history related to current functional performance    Occupational performance deficits (Please refer to evaluation for details): ADL's;IADL's;Education    Rehab Potential Good    Clinical Decision Making Several treatment options, min-mod task modification necessary    Comorbidities Affecting Occupational Performance: May have comorbidities impacting occupational performance    Modification or Assistance to Complete Evaluation  Min-Moderate modification of tasks or assist with assess necessary to complete eval    OT Frequency 2x / week    OT Duration 12 weeks    OT Treatment/Interventions Self-care/ADL training;DME  and/or AE instruction;Therapeutic exercise;Ultrasound;Neuromuscular education;Therapeutic activities;Energy conservation;Moist Heat;Patient/family education;Splinting;Functional Mobility Training;Paraffin    Plan Neuro re-ed and therapeutic exercise    Consulted and Agree with Plan of Care Patient             Leta Speller, MS, OTR/L   Darleene Cleaver, OT 07/19/2022, 11:27 AM

## 2022-07-22 ENCOUNTER — Ambulatory Visit: Payer: 59 | Attending: Physician Assistant

## 2022-07-22 DIAGNOSIS — Z8673 Personal history of transient ischemic attack (TIA), and cerebral infarction without residual deficits: Secondary | ICD-10-CM

## 2022-07-22 DIAGNOSIS — R278 Other lack of coordination: Secondary | ICD-10-CM | POA: Diagnosis not present

## 2022-07-22 DIAGNOSIS — M6281 Muscle weakness (generalized): Secondary | ICD-10-CM | POA: Diagnosis not present

## 2022-07-23 ENCOUNTER — Ambulatory Visit: Payer: Medicare Other

## 2022-07-23 ENCOUNTER — Ambulatory Visit: Payer: 59 | Admitting: Urology

## 2022-07-23 ENCOUNTER — Ambulatory Visit: Payer: 59

## 2022-07-23 NOTE — Therapy (Signed)
OUTPATIENT OCCUPATIONAL THERAPY NEURO TREATMENT NOTE  Patient Name: David Reeves MRN: [id number] DOB:1970/07/28, 52 y.o., male Today's Date: 07/23/2022  PCP: [name], PA-C REFERRING PROVIDER: [name], PA-C   OT End of Session - 07/23/22 1407     Visit Number 4    Number of Visits 24    Date for OT Re-Evaluation 10/04/22    Authorization Type Reporting period beginning 07/12/22    Progress Note Due on Visit 10    OT Start Time 1430    OT Stop Time 1515    OT Time Calculation (min) 45 min    Activity Tolerance Patient tolerated treatment well    Behavior During Therapy [facility] for tasks assessed/performed             Past Medical History:  Diagnosis Date   Back pain 04/22/2012   Bone spur    Bulging disc 04/22/2012   Degenerative disc disease    Osteoarthritis    Panic anxiety syndrome    Stroke ([place]) 10/2020   Taking multiple medications for chronic disease    Past Surgical History:  Procedure Laterality Date   CYSTECTOMY     head   HERNIA REPAIR Left 2006,2014   [facility]   TOE SURGERY Right 2007   Patient Active Problem List   Diagnosis Date Noted   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 06/12/2022   Alterations of sensations following cerebrovascular accident 03/08/2021   Aphasia as late effect of cerebrovascular accident (CVA) 03/08/2021   Chronic pain syndrome 01/23/2021   Primary hypertension 01/23/2021   Erectile dysfunction due to diseases classified elsewhere 01/23/2021   Cognitive dysfunction 01/23/2021   Elevated LDL cholesterol level 01/23/2021   History of stroke with residual deficit 01/23/2021   Primary insomnia 01/23/2021   Acute ischemic stroke 11/10/2020   Closed compression fracture of L1 vertebra 05/24/2016   Lumbar stenosis with neurogenic claudication 11/01/2015   Chronic bilateral low back pain with right-sided sciatica 11/08/2014   Hx of hemorrhoids 11/08/2014   Chronic pain associated with significant psychosocial dysfunction  09/22/2014   Panic disorder 09/22/2014   AB (asthmatic bronchitis) 08/17/2014   Anxiety disorder due to general medical condition 08/17/2014   Lumbosacral spondylosis without myelopathy 08/17/2014   Brash 08/17/2014   Episodic paroxysmal anxiety disorder 08/17/2014   Hernia, inguinal, right 08/17/2014   Inguinal hernia 10/14/2012   ONSET DATE: 11/09/2020  REFERRING DIAG: CVA  THERAPY DIAG:  Muscle weakness (generalized)  Other lack of coordination  H/O ischemic left MCA stroke  Rationale for Evaluation and Treatment Rehabilitation  PERTINENT HISTORY: Pt returns today for re-eval following first round of Botox for RUE spasticity; injections done on 07/11/22.  Pt had reached plateau with OT goals by mid Dec of 2023 as spasticity was limiting progression of coordination/dexterity skills in the R hand.  Pt planning to trial Botox with a second treatment planned 3 months out.  Pt is also in the process of being evaluated for Vivistim, but has not yet had the Fugl-Meyer Assessment in [place].  Awaiting insurance authorization for this.  Pt. is a 52 y.o. who was diagnosed with a CVA on July 21st, 2022. Pt. completed several weeks of  inpatient rehab at [facility]. After returning to home, Pt. sustained a fall in december of 2022, and was admitted to the hospital with COVID-19, and back pain from the fall. Since the most recent discharge, Pt. has been residing with the ex-wife until the Pt. is ready to return to independent living. Pt.  PMHx includes: Bilateral LBP with right sided sciatica, Lumbosacral spondylosis myelopathy, closed compression Fx of L1. Pt. enjoys cooking, and riding motorcycles.   PRECAUTIONS: Fall  SUBJECTIVE: Pt reports doing well today. PAIN:  Are you having pain? Yes: NPRS scale: 3/10 Pain location: low back Pain description: aching Aggravating factors: prolonged sitting or lying down Relieving factors: rest, heat, walking  [facility] OT Assessment        12/11/21 01/22/22  03/01/22 04/09/22 07/12/22               Observation/Other Assessments        Focus on Therapeutic Outcomes (FOTO)   46 50 49 49 56               Coordination        Right 9 Hole Peg Test unable  unable unable Unable (was able to remove 1 peg from hole but unable to pick up peg from dish) unable    Left 9 Hole Peg Test 30 sec  30 sec NT NT (WNL) NT (WNL)               Hand Function        Right Hand Grip (lbs) 35 (3rd setting) 30 lb (3rd);  41 lb (4th setting),  44 lbs (4th setting) 46 lbs (4th setting) 30 lbs (4th setting)    Right Hand Lateral Pinch 9 lbs   slipping 12 lbs (thumb slips) 16 lbs (multiple trials d/t thumb slips) 4 lbs (multiple trials d/t thumb slips) 14 lbs (Saehan)    Right Hand 3 Point Pinch   unable to position thumb to produce 3 pt pinch Unable to position thumb to produce 3 point pinch unable Unable (fingers slip) unable    Left Hand Grip (lbs) 116     95    Left Hand Lateral Pinch 29 lbs     31    Left 3 point pinch 27 lbs    32     OBJECTIVE:  TODAY'S TREATMENT:  Therapeutic Exercise: Facilitated wrist and forearm volitional movement patterns with focus on reps of active and AAROM for R forearm supination, wrist flexion, wrist extension with extended digits, and wrist circumduction.  OT assist necessary to stabilize R elbow at side during supination, and to stabilize forearm in 90* pronation during wrist mobility in order to reduce synergistic movement patterns.    Neuro re-ed: Focus on 2 different grasp patterns with the R hand, including pincer grasp and spherical grasp.  Pincer grasp pattern: pt practiced pinching and releasing a sticky note piece of paper between thumb and IF of the R hand.  Practiced modulating pinch strength to prevent OT from moving paper from pt's grasp.  Pt required repetitions of this pinch pattern with OT stabilizing IP joint of thumb in neutral while pt pushed thumb into pad of IF.  Following repetitions with IP joint stabilized, pt able to  pinch with slightly greater force against paper and IF without R thumb slipping from prehension pattern.  Spherical grasp pattern: Pt practiced grasp/release and a strong hold of a tennis ball to prevent OT from removing ball from hand.  Practiced active and active assisted palmar abd/add sliding thumb on tennis ball to adjust grip.    PATIENT EDUCATION: Education details: RUE strengthening and Associate Professor  Person educated: Patient Education method: Explanation, demo Education comprehension: verbalized and demonstrated understanding.      OT Short Term Goals - 6 weeks (08/23/22)  OT SHORT TERM GOAL #1   Title Pt. wil demonstrtae independence with HEPs    Baseline Eval: Pt. currently does not have one, 10th visit:  changes in HEP ongoing; 20th: instructed in putty exercises, changes ongoing; 40th: continual updates to HEP as pt progresses; 50th: continual updates; 01/22/22: continual updates; 03/01/22: continual updates; 04/09/22: pt demo indep with current hand strengthening and coordination exercises; will adjust as needed if pt is able to schedule Botox injections  Re-eval 07/12/22: will progress with any changes noted with Botox, currently working on active assisted ROM in all digits and digit opposition   Time 6    Period Weeks    Status On-going    Target Date 08/23/22            OT Long Term Goals - 12 weeks (10/04/22)      OT LONG TERM GOAL #1   Title Pt. will improve RUE strength by 2 mm grades to assist with ADLs, and IADLs Revised 07/12/22: Pt will improve R forearm and wrist strength by 1 MM grade in order to turn a door knob with the R hand.   Baseline Eval: Right shoulder flexion, abduction 3/5, elbow flexion, extension wrist extension 3+/5, 10th visit: improving with RUE strength by not yet met goal; 20th visit: R shd flex/abd 4-, elbow flex/ext 4+, wrist flex 4-, wrist ext 4+; 40:  R shd flex/abd 4,  elbow flex/ext 4+, wrist flex 4, wrist ext 4+; 50th visit 8/22:  R shoulder flex/abd 4/5, elbow flex/ext 5/5, wrist flex 4, wrist ext 4+; 01/22/22: shoulder flex/abd 4/5, elbow flex/ext 5/5, wrist flex 4, wrist ext 4+; 03/01/22: same as 01/22/22  Re-eval 07/12/22: R shoulder flex/abd 4/5, elbow flex/ext 5/5, forearm sup 3-, wrist ext 3+, wrist flex NT (limited by tone)   Time 12    Period Weeks    Status On-going    Target Date 10/04/22     OT LONG TERM GOAL #2   Title Pt. will improve right grip by 10 lbs to prepare for firmly holding objects for IADLs.    Baseline Eval: R: 38#, L: 124# (In 3rd dynamometer slot); R 35# in 4th slot, 28# in 3rd slot, 40th: 47 in 4th slot; 50th: 12/11/21: 46# on 3rd slot; 01/22/22: 30# on 3rd slot; 03/01/22: 44# 4th slot; 04/09/22: 46#  Re-eval 07/12/22: R grip 30#   Time 12    Period Weeks    Status On-going    Target Date 6/14//24     OT LONG TERM GOAL #3   Title Pt. will improve right lateral pinch strength by 5# to assist with cutting food  Revised 07/12/22: increase by 3# or more    Baseline Eval: R: 17, L: 25; 20th: R: 18; 40th: 15# fingers slipping on meter, 50th: 9# fingers slipping; 01/22/22: 12# fingers slipping; 03/01/22: 16#; 04/09/22: 5# thumb slipping  Re-eval 07/12/22: R 14# (Saehan gauge)   Time 12    Period Weeks    Status On-going    Target Date 10/04/22     OT LONG TERM GOAL #4   Title Pt. will improve Right 3pt pinch by 2# to be able to hold/open items for cooking    Baseline Eval: R: Pt. unable to engage thumb L: 29#; 20th: R unable; 40th:  unable, fingers slipping; 50th:  unable, fingers slipping; 01/22/22: fingers slipping but pt is now able to grasp a ball peg with 3 point pinch pattern; 03/01/22: same as 10/3 in addition to being able to  pick up ball peg from pegboard then place peg back into pegboard with a 3 point pinch pattern; 04/10/23: pt can pick up a ball peg from pegboard and place back into peg board using a 3 point pinch pattern, but when challenged to apply any force with this pinch, fingers  slip from position. Unable to register a 3 point pinch on pinch gauge.  Re-eval 07/12/22: unable   Time 12    Period Weeks    Status On-going    Target Date 6/14//24     OT LONG TERM GOAL #5   Title Pt. will improve right hand [facility] skills to be able to independently manipulate buttons, and zippers.  Revised 07/12/22: Pt will use R hand as a good stabilizer to manage clothing fasteners with increased time.   Baseline Eval: Pt. has difficulty managing buttons,a nd zippers. 10th visit:  improving; 20th: unable to engage R hand effectively, performs 1 handed with L; 50th: performs L handed only; 01/22/22: pt can use R hand as a stabilizer with clothing fasteners with mod vc; 03/01/22: same as 01/22/22; 04/09/22: Pt uses R hand as a stabilizer with mod vc  Re-eval 07/12/22: pt manages clothing fasteners with L hand only   Time 12    Period Weeks    Status On-going    Target Date 10/04/22     OT LONG TERM GOAL #6   Title Pt. will independently write his name    Baseline Eval: Pt. is unable to hold a pen; 20th: pt can grip PenAgain with R hand and can make marks on paper with difficulty, but not yet writing name; 40th:  continues to demonstrate difficulty with grasp of pen; 50th 12/11/21: With assist to set up hand on pen again, pt was able to write first name and initial of last name in 1 1/4" tall letters with fair legibility (pt wants to defer this goal, states this is low on his list of priorities for now)   Time 12    Period Weeks    Status D/c (no longer a priority for pt)   Target Date 04/16/22      OT LONG TERM GOAL #7   Title Pt. will improve FOTO score by 2 points to reflect funational improvement    Baseline Eval: FOTO score 46 with TR score 52; 20th: 45; 40th: 44;  50th: 46; 01/22/22: 50; 03/01/22: 49; 04/09/22: 49  Re-eval 07/12/22: 56; predicted 58   Time 12    Period Weeks    Status On-going    Target Date 10/04/22            Plan -    Clinical Impression Statement This OT  collaborated with OT in [place] to schedule pt's Fugl-Meyer for pt's Vivistim eval, anticipating readiness for this assessment.  Pt should get a call soon regarding scheduling.  Focus today on facilitation of wrist and forearm volitional movement patterns, including supination, wrist flex/ext, and circumduction as well as formulating 2 different grasp patterns, including pincer grasp and spherical grasp.  Pt continues to require OT assist to stabilize elbow and forearm to reduce synergistic movement patterns distally in the RUE.   Pt can lightly pinch a piece of paper withstanding minimal resistance <50% of the time using a pincer grasp in the R hand.  Pt required reps of active and active assisted palmar abduction to adjust grip on a ball to promote a stronger spherical grasp.  Pt continues to report benefits from recent Botox.  Pt  will continue to benefit from skilled OT to maximize strength and dexterity skills in the RUE in combination with Botox injections in order to maximize functional use of the RUE for daily tasks and prepare for Vivistim evaluation.      OT Occupational Profile and History Detailed Assessment- Review of Records and additional review of physical, cognitive, psychosocial history related to current functional performance    Occupational performance deficits (Please refer to evaluation for details): ADL's;IADL's;Education    Rehab Potential Good    Clinical Decision Making Several treatment options, min-mod task modification necessary    Comorbidities Affecting Occupational Performance: May have comorbidities impacting occupational performance    Modification or Assistance to Complete Evaluation  Min-Moderate modification of tasks or assist with assess necessary to complete eval    OT Frequency 2x / week    OT Duration 12 weeks    OT Treatment/Interventions Self-care/ADL training;DME and/or AE instruction;Therapeutic exercise;Ultrasound;Neuromuscular education;Therapeutic  activities;Energy conservation;Moist Heat;Patient/family education;Splinting;Functional Mobility Training;Paraffin    Plan Neuro re-ed and therapeutic exercise    Consulted and Agree with Plan of Care Patient             Leta Speller, MS, OTR/L   Darleene Cleaver, OT 07/23/2022, 2:10 PM

## 2022-07-24 DIAGNOSIS — I69322 Dysarthria following cerebral infarction: Secondary | ICD-10-CM | POA: Diagnosis not present

## 2022-07-24 DIAGNOSIS — I10 Essential (primary) hypertension: Secondary | ICD-10-CM | POA: Diagnosis not present

## 2022-07-24 DIAGNOSIS — G8929 Other chronic pain: Secondary | ICD-10-CM | POA: Diagnosis not present

## 2022-07-24 DIAGNOSIS — M545 Low back pain, unspecified: Secondary | ICD-10-CM | POA: Diagnosis not present

## 2022-07-24 DIAGNOSIS — M791 Myalgia, unspecified site: Secondary | ICD-10-CM | POA: Diagnosis not present

## 2022-07-24 DIAGNOSIS — I69351 Hemiplegia and hemiparesis following cerebral infarction affecting right dominant side: Secondary | ICD-10-CM | POA: Diagnosis not present

## 2022-07-24 DIAGNOSIS — G8921 Chronic pain due to trauma: Secondary | ICD-10-CM | POA: Diagnosis not present

## 2022-07-24 DIAGNOSIS — Z79891 Long term (current) use of opiate analgesic: Secondary | ICD-10-CM | POA: Diagnosis not present

## 2022-07-25 ENCOUNTER — Ambulatory Visit (INDEPENDENT_AMBULATORY_CARE_PROVIDER_SITE_OTHER): Payer: 59 | Admitting: Urology

## 2022-07-25 ENCOUNTER — Ambulatory Visit: Payer: 59

## 2022-07-25 ENCOUNTER — Ambulatory Visit: Payer: 59 | Admitting: Urology

## 2022-07-25 ENCOUNTER — Ambulatory Visit: Payer: Medicare Other

## 2022-07-25 ENCOUNTER — Other Ambulatory Visit: Payer: Self-pay | Admitting: Urology

## 2022-07-25 VITALS — BP 109/65 | HR 75 | Ht 73.0 in | Wt 230.5 lb

## 2022-07-25 DIAGNOSIS — Z8673 Personal history of transient ischemic attack (TIA), and cerebral infarction without residual deficits: Secondary | ICD-10-CM

## 2022-07-25 DIAGNOSIS — R278 Other lack of coordination: Secondary | ICD-10-CM

## 2022-07-25 DIAGNOSIS — M6281 Muscle weakness (generalized): Secondary | ICD-10-CM | POA: Diagnosis not present

## 2022-07-25 DIAGNOSIS — N4340 Spermatocele of epididymis, unspecified: Secondary | ICD-10-CM

## 2022-07-25 NOTE — H&P (View-Only) (Signed)
 I, Amy L Pierron,acting as a scribe for Irineo Gaulin, MD.,have documented all relevant documentation on the behalf of David Doberstein, MD,as directed by  Arkeem Harts, MD while in the presence of Deakin Lacek, MD.  07/25/2022 2:17 PM   David Reeves 10/04/1970 [id number]  Referring provider: Drubel, Lindsay, PA-C [address] [place],  [place] [address]  Chief Complaint  Patient presents with   Follow-up    spermatocele    HPI: 51 year-old male with a personal history of a chronic left spermatocele presents today to discuss surgery.   He was initially seen and evaluated in 2022 for this same issue. He had a scrotal ultrasound indicating an 8.8 centimeter left epididymal cyst suggestive of a spermatocele.  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 It was recommended to wait at least six months along with neurological clearance, but he never followed up. He's now desiring to proceed.   He denies feeling that his scrotum has gotten bigger, however, he has become accustomed to it and sitting down carefully as needed. His hemiparesis has greatly improved due to physical therapy. He takes aspirin regularly and is on a pain patch.   PMH: Past Medical History:  Diagnosis Date   Back pain 04/22/2012   Bone spur    Bulging disc 04/22/2012   Degenerative disc disease    Osteoarthritis    Panic anxiety syndrome    Stroke ([facility]) 10/2020   Taking multiple medications for chronic disease     Surgical History: Past Surgical History:  Procedure Laterality Date   CYSTECTOMY     head   HERNIA REPAIR Left 2006,2014   [facility]   TOE SURGERY Right 2007    Home Medications:  Allergies as of 07/25/2022       Reactions   Duloxetine    Other reaction(s): Other (See Comments) Increased temperature, sweating, uncontrolled shaking, aggressive thoughts   Amitriptyline Other (See Comments)   Urine retention    Buspirone Other (See Comments)   Kidney pain   Ciprofloxacin Other (See Comments)   hallucinations   Doxepin Other (See Comments)   "Didn't feel right"   Escitalopram Other (See Comments)   Agitation   Olanzapine    Penicillins Other (See Comments)   Unknown -- childhood reaction   Sertraline Other (See Comments)   Urine retention   Trazodone Other (See Comments)   abd pain, urine retention        Medication List        Accurate as of July 25, 2022  2:17 PM. If you have any questions, ask your nurse or doctor.          acetaminophen 500 MG tablet Commonly known as: TYLENOL Take 1,000 mg by mouth every 6 (six) hours as needed for mild pain.   amLODipine 5 MG tablet Commonly known as: NORVASC TAKE 1 TABLET (5 MG TOTAL) BY MOUTH DAILY.   aspirin EC 81 MG tablet Take 81 mg by mouth daily. Swallow whole.   atorvastatin 80 MG tablet Commonly known as: LIPITOR TAKE 1 TABLET BY MOUTH EVERY DAY   buprenorphine 10 MCG/HR Ptwk Commonly known as: BUTRANS 1 patch once a week.   buprenorphine 15 MCG/HR Commonly known as: BUTRANS 1 patch once a week.   celecoxib 100 MG capsule Commonly known as: CELEBREX TAKE 1 CAPSULE BY MOUTH TWICE  DAILY   diazepam 5 MG tablet Commonly known as: VALIUM TAKE 1 TABLET (5   MG TOTAL) BY MOUTH EVERY 12 HOURS AS NEEDED FOR MUSCLE SPASM   diazepam 10 MG tablet Commonly known as: VALIUM SMARTSIG:1 Tablet(s) By Mouth Every 12 Hours   donepezil 10 MG tablet Commonly known as: ARICEPT TAKE 1 TABLET BY MOUTH AT  BEDTIME   gabapentin 400 MG capsule Commonly known as: NEURONTIN TAKE 1 CAPSULE BY MOUTH 3 TIMES  DAILY   lidocaine 5 % Commonly known as: LIDODERM 1 patch daily.   multivitamin tablet Take 1 tablet by mouth daily.   prazosin 1 MG capsule Commonly known as: MINIPRESS TAKE 2 CAPSULES BY MOUTH AT  BEDTIME , DO NOT TAKE WITH  AMBIEN   tizanidine 6 MG capsule Commonly known as: ZANAFLEX Take 6 mg by mouth 4 (four) times  daily. What changed: Another medication with the same name was removed. Continue taking this medication, and follow the directions you see here. Changed by: Taedyn Glasscock, MD   Vitamin D3 50 MCG (2000 UT) Tabs Generic drug: Cholecalciferol Take by mouth.        Allergies:  Allergies  Allergen Reactions   Duloxetine     Other reaction(s): Other (See Comments) Increased temperature, sweating, uncontrolled shaking, aggressive thoughts   Amitriptyline Other (See Comments)    Urine retention   Buspirone Other (See Comments)    Kidney pain   Ciprofloxacin Other (See Comments)    hallucinations   Doxepin Other (See Comments)    "Didn't feel right"   Escitalopram Other (See Comments)    Agitation   Olanzapine    Penicillins Other (See Comments)    Unknown -- childhood reaction   Sertraline Other (See Comments)    Urine retention   Trazodone Other (See Comments)    abd pain, urine retention    Family History: Family History  Problem Relation Age of Onset   Diabetes Mother    Diabetes Maternal Uncle    Diabetes Maternal Grandmother    Heart disease Maternal Grandmother     Social History:  reports that he has never smoked. He has never used smokeless tobacco. He reports that he does not currently use alcohol. He reports that he does not currently use drugs after having used the following drugs: Marijuana.  Physical Exam: BP 109/65   Pulse 75   Ht 6' 1" (1.854 m)   Wt 230 lb 8 oz (104.6 kg)   BMI 30.41 kg/m   Constitutional:  Alert and oriented, No acute distress. HEENT: [place] AT, moist mucus membranes.  Trachea midline, no masses. GU: Large softball sized spermatocele. Left testicle was palpable. Right testicle was normal. Small 1-2 cm spermatocele also present. Neurologic: Grossly intact, no focal deficits, moving all 4 extremities. Psychiatric: Normal mood and affect.  Assessment & Plan:    Left spermatocele  - Would like to proceed with spermatocelectomy.   -  Risks discussed including post-operative bleeding, infection, and hematoma. Total healing may take about a month. In the first month there may still be some fullness or firmness but full recovery in 2-3 months. The chance of recurrence is low. Recommended he wears tighter compressive underwear for a few weeks. He may attend physical therapy as usual, however not engage in heavy lifting or straining for a few weeks.  -  He'll need neurologic clearance to hold his aspirin. If that's not possible, we'll leave a drain.   - Also need to discuss pain management with Dr. Khan.  I have reviewed the above documentation for accuracy and completeness, and I agree   with the above.   Kyshaun Barnette, MD   Return if symptoms worsen or fail to improve.  [facility] [address], [address] [place], [place] [address] [phone]   

## 2022-07-25 NOTE — Progress Notes (Signed)
Surgical Physician Order Form   * Scheduling expectation :  Next Available   *Length of Case:    *Clearance needed: Neurology to hold ASA, Dr. Chancy Milroy for pain med post op recs   *Anticoagulation Instructions: Hold all anticoagulation   *Aspirin Instructions:  Preference to hold baby aspirin but if deemed too risky, will do surgery on baby aspirin if needed   *Post-op visit Date/Instructions:  1 month follow up   *Diagnosis:  Left spermatocele   *Procedure:  Left spermatocelectomy   -Admit type: OUTpatient   -Anesthesia: General   -VTE Prophylaxis Standing Order SCD's       Other:    -Standing Lab Orders Per Anesthesia     Lab other: None   -Standing Test orders EKG/Chest x-ray per Anesthesia                   Test other:    - Medications:     Ancef 2gm IV okay with childhood allergy

## 2022-07-25 NOTE — Progress Notes (Signed)
I, Amy L Pierron,acting as a scribe for Hollice Espy, MD.,have documented all relevant documentation on the behalf of Hollice Espy, MD,as directed by  Hollice Espy, MD while in the presence of Hollice Espy, MD.  07/25/2022 2:17 PM   Doristine Church Jul 11, 1970 [id number]  Referring provider: [name], PA-C [address] [place],  [place] [address]  Chief Complaint  Patient presents with   Follow-up    spermatocele    HPI: 52 year-old male with a personal history of a chronic left spermatocele presents today to discuss surgery.   He was initially seen and evaluated in 2022 for this same issue. He had a scrotal ultrasound indicating an 8.8 centimeter left epididymal cyst suggestive of a spermatocele.  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 It was recommended to wait at least six months along with neurological clearance, but he never followed up. He's now desiring to proceed.   He denies feeling that his scrotum has gotten bigger, however, he has become accustomed to it and sitting down carefully as needed. His hemiparesis has greatly improved due to physical therapy. He takes aspirin regularly and is on a pain patch.   PMH: Past Medical History:  Diagnosis Date   Back pain 04/22/2012   Bone spur    Bulging disc 04/22/2012   Degenerative disc disease    Osteoarthritis    Panic anxiety syndrome    Stroke [facility]) 10/2020   Taking multiple medications for chronic disease     Surgical History: Past Surgical History:  Procedure Laterality Date   CYSTECTOMY     head   HERNIA REPAIR Left 2006,2014   [facility]   TOE SURGERY Right 2007    Home Medications:  Allergies as of 07/25/2022       Reactions   Duloxetine    Other reaction(s): Other (See Comments) Increased temperature, sweating, uncontrolled shaking, aggressive thoughts   Amitriptyline Other (See Comments)   Urine retention    Buspirone Other (See Comments)   Kidney pain   Ciprofloxacin Other (See Comments)   hallucinations   Doxepin Other (See Comments)   "Didn't feel right"   Escitalopram Other (See Comments)   Agitation   Olanzapine    Penicillins Other (See Comments)   Unknown -- childhood reaction   Sertraline Other (See Comments)   Urine retention   Trazodone Other (See Comments)   abd pain, urine retention        Medication List        Accurate as of July 25, 2022  2:17 PM. If you have any questions, ask your nurse or doctor.          acetaminophen 500 MG tablet Commonly known as: TYLENOL Take 1,000 mg by mouth every 6 (six) hours as needed for mild pain.   amLODipine 5 MG tablet Commonly known as: NORVASC TAKE 1 TABLET (5 MG TOTAL) BY MOUTH DAILY.   aspirin EC 81 MG tablet Take 81 mg by mouth daily. Swallow whole.   atorvastatin 80 MG tablet Commonly known as: LIPITOR TAKE 1 TABLET BY MOUTH EVERY DAY   buprenorphine 10 MCG/HR Ptwk Commonly known as: BUTRANS 1 patch once a week.   buprenorphine 15 MCG/HR Commonly known as: BUTRANS 1 patch once a week.   celecoxib 100 MG capsule Commonly known as: CELEBREX TAKE 1 CAPSULE BY MOUTH TWICE  DAILY   diazepam 5 MG tablet Commonly known as: VALIUM TAKE 1 TABLET (5  MG TOTAL) BY MOUTH EVERY 12 HOURS AS NEEDED FOR MUSCLE SPASM   diazepam 10 MG tablet Commonly known as: VALIUM SMARTSIG:1 Tablet(s) By Mouth Every 12 Hours   donepezil 10 MG tablet Commonly known as: ARICEPT TAKE 1 TABLET BY MOUTH AT  BEDTIME   gabapentin 400 MG capsule Commonly known as: NEURONTIN TAKE 1 CAPSULE BY MOUTH 3 TIMES  DAILY   lidocaine 5 % Commonly known as: LIDODERM 1 patch daily.   multivitamin tablet Take 1 tablet by mouth daily.   prazosin 1 MG capsule Commonly known as: MINIPRESS TAKE 2 CAPSULES BY MOUTH AT  BEDTIME , DO NOT TAKE WITH  AMBIEN   tizanidine 6 MG capsule Commonly known as: ZANAFLEX Take 6 mg by mouth 4 (four) times  daily. What changed: Another medication with the same name was removed. Continue taking this medication, and follow the directions you see here. Changed by: Hollice Espy, MD   Vitamin D3 50 MCG (2000 UT) Tabs Generic drug: Cholecalciferol Take by mouth.        Allergies:  Allergies  Allergen Reactions   Duloxetine     Other reaction(s): Other (See Comments) Increased temperature, sweating, uncontrolled shaking, aggressive thoughts   Amitriptyline Other (See Comments)    Urine retention   Buspirone Other (See Comments)    Kidney pain   Ciprofloxacin Other (See Comments)    hallucinations   Doxepin Other (See Comments)    "Didn't feel right"   Escitalopram Other (See Comments)    Agitation   Olanzapine    Penicillins Other (See Comments)    Unknown -- childhood reaction   Sertraline Other (See Comments)    Urine retention   Trazodone Other (See Comments)    abd pain, urine retention    Family History: Family History  Problem Relation Age of Onset   Diabetes Mother    Diabetes Maternal Uncle    Diabetes Maternal Grandmother    Heart disease Maternal Grandmother     Social History:  reports that he has never smoked. He has never used smokeless tobacco. He reports that he does not currently use alcohol. He reports that he does not currently use drugs after having used the following drugs: Marijuana.  Physical Exam: BP 109/65   Pulse 75   Ht 6\' 1"  (1.854 m)   Wt 230 lb 8 oz (104.6 kg)   BMI 30.41 kg/m   Constitutional:  Alert and oriented, No acute distress. HEENT: [place] AT, moist mucus membranes.  Trachea midline, no masses. GU: Large softball sized spermatocele. Left testicle was palpable. Right testicle was normal. Small 1-2 cm spermatocele also present. Neurologic: Grossly intact, no focal deficits, moving all 4 extremities. Psychiatric: Normal mood and affect.  Assessment & Plan:    Left spermatocele  - Would like to proceed with spermatocelectomy.   -  Risks discussed including post-operative bleeding, infection, and hematoma. Total healing may take about a month. In the first month there may still be some fullness or firmness but full recovery in 2-3 months. The chance of recurrence is low. Recommended he wears tighter compressive underwear for a few weeks. He may attend physical therapy as usual, however not engage in heavy lifting or straining for a few weeks.  -  He'll need neurologic clearance to hold his aspirin. If that's not possible, we'll leave a drain.   - Also need to discuss pain management with Dr. Humphrey Rolls.  I have reviewed the above documentation for accuracy and completeness, and I agree  with the above.   Hollice Espy, MD   Return if symptoms worsen or fail to improve.  [facility] [address] [place], [place] [address] [phone]

## 2022-07-26 ENCOUNTER — Telehealth: Payer: Self-pay

## 2022-07-26 NOTE — Telephone Encounter (Signed)
I spoke with David Reeves. We have discussed possible surgery dates and Monday April 15th, 2024 was agreed upon by all parties. Patient given information about surgery date, what to expect pre-operatively and post operatively.  We discussed that a Pre-Admission Testing office will be calling to set up the pre-op visit that will take place prior to surgery, and that these appointments are typically done over the phone with a Pre-Admissions RN. Informed patient that our office will communicate any additional care to be provided after surgery. Patients questions or concerns were discussed during our call. Advised to call our office should there be any additional information, questions or concerns that arise. Patient verbalized understanding.

## 2022-07-26 NOTE — Progress Notes (Signed)
  Phone Number: [phone] for Surgical Coordinator Fax Number: [phone]  REQUEST FOR SURGICAL CLEARANCE       Date: Date: 07/26/22  Faxed to: Dr. Riley Kill, MD  Surgeon: Dr. Vanna Scotland, MD     Date of Surgery: 08/05/2022  Operation: Left Spermatocelectomy   Anesthesia Type: General   Diagnosis: Left Spermatocele  Patient Requires:   Medical Clearance : Yes   Risk Assessment:    Low   []       Moderate   []     High   []           This patient is optimized for surgery  YES []       NO   []    I recommend further assessment/workup prior to surgery. YES []      NO  []   Appointment scheduled for: _______________________   Further recommendations: ____________________________________     Physician Signature:__________________________________   Printed Name: ________________________________________   Date: _________________

## 2022-07-26 NOTE — Progress Notes (Signed)
  Phone Number: [phone] for Surgical Coordinator Fax Number: [phone]  REQUEST FOR SURGICAL CLEARANCE       Date: Date: 07/26/22  Faxed to: Dr. Welton Flakes, MD  Surgeon: Dr. Vanna Scotland     Date of Surgery: 08/05/2022  Operation: Left Spermatocelectomy   Anesthesia Type: General    Diagnosis: Left Spermatocele  Patient Requires:   Would like Recommendations for Post Op Pain Medication.    Risk Assessment:    Low   []       Moderate   []     High   []           This patient is optimized for surgery  YES []       NO   []    I recommend further assessment/workup prior to surgery. YES []      NO  []   Appointment scheduled for: _______________________   Further recommendations: ____________________________________     Physician Signature:__________________________________   Printed Name: ________________________________________   Date: _________________

## 2022-07-26 NOTE — Progress Notes (Signed)
  Phone Number: [phone] for Surgical Coordinator Fax Number: [phone]  REQUEST FOR SURGICAL CLEARANCE       Date: Date: 08/05/2022  Faxed to: Arvella Nigh, PA  Surgeon: Dr. Vanna Scotland, MD     Date of Surgery: 08/05/2022  Operation: Left Spermatocelectomy  Anesthesia Type: General    Diagnosis: Left Spermatocele  Patient Requires:   Medical Clearance : Yes  Reason: Would like for patient to hold baby ASA prior to surgery.    Risk Assessment:    Low   []       Moderate   []     High   []           This patient is optimized for surgery  YES []       NO   []    I recommend further assessment/workup prior to surgery. YES []      NO  []   Appointment scheduled for: _______________________   Further recommendations: ____________________________________     Physician Signature:__________________________________   Printed Name: ________________________________________   Date: _________________

## 2022-07-26 NOTE — Progress Notes (Signed)
   [facility] Surgical Posting Form  Surgery Date: Date: 08/05/2022  Surgeon: Dr. Vanna Scotland, MD  Inpt ( No  )   Outpt (Yes)   Obs ( No  )   Diagnosis: N43.40 Left Spermatocele  -CPT: [phone]  Surgery: Left Spermatocelectomy  Stop Anticoagulations: Yes and will need to hold ASA  Cardiac/Medical/Pulmonary Clearance needed: yes  Clearance needed from Dr: Riley Kill, Physical Med and Rehab and Dr. Welton Flakes- Pain MGMT  Clearance request sent on: Date: 07/26/22  *Orders entered into EPIC  Date: 07/26/22   *Case booked in EPIC  Date: 07/26/22  *Notified pt of Surgery: Date: 07/26/22  PRE-OP UA & CX: no  *Placed into Prior Authorization Work [place] Date: 07/26/22  Assistant/laser/rep:No

## 2022-07-28 NOTE — Therapy (Signed)
OUTPATIENT OCCUPATIONAL THERAPY NEURO TREATMENT NOTE  Patient Name: David Reeves MRN: [id number] DOB:[birth year], 52 y.o., male Today's Date: 07/28/2022  PCP: Alfredia Ferguson, PA-C REFERRING PROVIDER: Alfredia Ferguson, PA-C   OT End of Session - 07/28/22 1533     Visit Number 5    Number of Visits 24    Date for OT Re-Evaluation 10/04/22    Authorization Type Reporting period beginning 07/12/22    Progress Note Due on Visit 10    OT Start Time 1100    OT Stop Time 1145    OT Time Calculation (min) 45 min    Activity Tolerance Patient tolerated treatment well    Behavior During Therapy [facility] for tasks assessed/performed             Past Medical History:  Diagnosis Date   Back pain 04/22/2012   Bone spur    Bulging disc 04/22/2012   Degenerative disc disease    Osteoarthritis    Panic anxiety syndrome    Stroke (HCC) 10/2020   Taking multiple medications for chronic disease    Past Surgical History:  Procedure Laterality Date   CYSTECTOMY     head   HERNIA REPAIR Left 2006,2014   [facility]   TOE SURGERY Right 2007   Patient Active Problem List   Diagnosis Date Noted   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 06/12/2022   Alterations of sensations following cerebrovascular accident 03/08/2021   Aphasia as late effect of cerebrovascular accident (CVA) 03/08/2021   Chronic pain syndrome 01/23/2021   Primary hypertension 01/23/2021   Erectile dysfunction due to diseases classified elsewhere 01/23/2021   Cognitive dysfunction 01/23/2021   Elevated LDL cholesterol level 01/23/2021   History of stroke with residual deficit 01/23/2021   Primary insomnia 01/23/2021   Acute ischemic stroke 11/10/2020   Closed compression fracture of L1 vertebra 05/24/2016   Lumbar stenosis with neurogenic claudication 11/01/2015   Chronic bilateral low back pain with right-sided sciatica 11/08/2014   Hx of hemorrhoids 11/08/2014   Chronic pain associated with significant psychosocial dysfunction  09/22/2014   Panic disorder 09/22/2014   AB (asthmatic bronchitis) 08/17/2014   Anxiety disorder due to general medical condition 08/17/2014   Lumbosacral spondylosis without myelopathy 08/17/2014   Brash 08/17/2014   Episodic paroxysmal anxiety disorder 08/17/2014   Hernia, inguinal, right 08/17/2014   Inguinal hernia 10/14/2012   ONSET DATE: 11/09/2020  REFERRING DIAG: CVA  THERAPY DIAG:  Muscle weakness (generalized)  Other lack of coordination  H/O ischemic left MCA stroke  Rationale for Evaluation and Treatment Rehabilitation  PERTINENT HISTORY: Pt returns today for re-eval following first round of Botox for RUE spasticity; injections done on 07/11/22.  Pt had reached plateau with OT goals by mid Dec of 2023 as spasticity was limiting progression of coordination/dexterity skills in the R hand.  Pt planning to trial Botox with a second treatment planned 3 months out.  Pt is also in the process of being evaluated for Vivistim, but has not yet had the Fugl-Meyer Assessment in [place].  Awaiting insurance authorization for this.  Pt. is a 52 y.o. who was diagnosed with a CVA on July 21st, 2022. Pt. completed several weeks of  inpatient rehab at [facility]. After returning to home, Pt. sustained a fall in december of 2022, and was admitted to the hospital with COVID-19, and back pain from the fall. Since the most recent discharge, Pt. has been residing with the ex-wife until the Pt. is ready to return to independent living. Pt.  PMHx includes: Bilateral LBP with right sided sciatica, Lumbosacral spondylosis myelopathy, closed compression Fx of L1. Pt. enjoys cooking, and riding motorcycles.   PRECAUTIONS: Fall  SUBJECTIVE: Pt reports doing well today. PAIN:  Are you having pain? Yes: NPRS scale: 2/10 Pain location: low back Pain description: aching Aggravating factors: prolonged sitting or lying down Relieving factors: rest, heat, walking  [facility] OT Assessment        12/11/21 01/22/22  03/01/22 04/09/22 07/12/22               Observation/Other Assessments        Focus on Therapeutic Outcomes (FOTO)   46 50 49 49 56               Coordination        Right 9 Hole Peg Test unable  unable unable Unable (was able to remove 1 peg from hole but unable to pick up peg from dish) unable    Left 9 Hole Peg Test 30 sec  30 sec NT NT (WNL) NT (WNL)               Hand Function        Right Hand Grip (lbs) 35 (3rd setting) 30 lb (3rd);  41 lb (4th setting),  44 lbs (4th setting) 46 lbs (4th setting) 30 lbs (4th setting)    Right Hand Lateral Pinch 9 lbs   slipping 12 lbs (thumb slips) 16 lbs (multiple trials d/t thumb slips) 4 lbs (multiple trials d/t thumb slips) 14 lbs (Saehan)    Right Hand 3 Point Pinch   unable to position thumb to produce 3 pt pinch Unable to position thumb to produce 3 point pinch unable Unable (fingers slip) unable    Left Hand Grip (lbs) 116     95    Left Hand Lateral Pinch 29 lbs     31    Left 3 point pinch 27 lbs    32     OBJECTIVE:  TODAY'S TREATMENT:  Therapeutic Exercise: Facilitated wrist and forearm volitional movement patterns with focus on reps of active and AAROM for R forearm supination, wrist flexion, wrist extension with extended digits, and wrist circumduction.  OT assist necessary to stabilize R elbow at side during supination, and to stabilize forearm in 90* pronation during wrist mobility in order to reduce synergistic movement patterns.    Neuro re-ed: Continued focus on 2 different grasp patterns with the R hand, including pincer grasp and spherical grasp.  Pincer grasp pattern: pt practiced pinching and releasing a sticky note piece of paper between thumb and IF of the R hand.  Practiced modulating pinch strength to prevent OT from moving paper from pt's grasp.  Pt required repetitions of this pinch pattern with OT stabilizing IP joint of thumb in neutral while pt pushed thumb into pad of IF.  Following repetitions with IP joint stabilized,  pt able to pinch with slightly greater force against paper and IF without R thumb slipping from prehension pattern.  Spherical grasp pattern: Pt practiced grasp/release and a strong hold of a tennis ball to prevent OT from removing ball from hand.  Practiced active and active assisted palmar abd/add sliding thumb on tennis ball to adjust grip.    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      OT Short Term Goals - 6 weeks (08/23/22)  OT SHORT TERM GOAL #1   Title Pt. wil demonstrtae independence with HEPs    Baseline Eval: Pt. currently does not have one, 10th visit:  changes in HEP ongoing; 20th: instructed in putty exercises, changes ongoing; 40th: continual updates to HEP as pt progresses; 50th: continual updates; 01/22/22: continual updates; 03/01/22: continual updates; 04/09/22: pt demo indep with current hand strengthening and coordination exercises; will adjust as needed if pt is able to schedule Botox injections  Re-eval 07/12/22: will progress with any changes noted with Botox, currently working on active assisted ROM in all digits and digit opposition   Time 6    Period Weeks    Status On-going    Target Date 08/23/22            OT Long Term Goals - 12 weeks (10/04/22)      OT LONG TERM GOAL #1   Title Pt. will improve RUE strength by 2 mm grades to assist with ADLs, and IADLs Revised 07/12/22: Pt will improve R forearm and wrist strength by 1 MM grade in order to turn a door knob with the R hand.   Baseline Eval: Right shoulder flexion, abduction 3/5, elbow flexion, extension wrist extension 3+/5, 10th visit: improving with RUE strength by not yet met goal; 20th visit: R shd flex/abd 4-, elbow flex/ext 4+, wrist flex 4-, wrist ext 4+; 40:  R shd flex/abd 4,  elbow flex/ext 4+, wrist flex 4, wrist ext 4+; 50th  visit 8/22: R shoulder flex/abd 4/5, elbow flex/ext 5/5, wrist flex 4, wrist ext 4+; 01/22/22: shoulder flex/abd 4/5, elbow flex/ext 5/5, wrist flex 4, wrist ext 4+; 03/01/22: same as 01/22/22  Re-eval 07/12/22: R shoulder flex/abd 4/5, elbow flex/ext 5/5, forearm sup 3-, wrist ext 3+, wrist flex NT (limited by tone)   Time 12    Period Weeks    Status On-going    Target Date 10/04/22     OT LONG TERM GOAL #2   Title Pt. will improve right grip by 10 lbs to prepare for firmly holding objects for IADLs.    Baseline Eval: R: 38#, L: 124# (In 3rd dynamometer slot); R 35# in 4th slot, 28# in 3rd slot, 40th: 47 in 4th slot; 50th: 12/11/21: 46# on 3rd slot; 01/22/22: 30# on 3rd slot; 03/01/22: 44# 4th slot; 04/09/22: 46#  Re-eval 07/12/22: R grip 30#   Time 12    Period Weeks    Status On-going    Target Date 6/14//24     OT LONG TERM GOAL #3   Title Pt. will improve right lateral pinch strength by 5# to assist with cutting food  Revised 07/12/22: increase by 3# or more    Baseline Eval: R: 17, L: 25; 20th: R: 18; 40th: 15# fingers slipping on meter, 50th: 9# fingers slipping; 01/22/22: 12# fingers slipping; 03/01/22: 16#; 04/09/22: 5# thumb slipping  Re-eval 07/12/22: R 14# (Saehan gauge)   Time 12    Period Weeks    Status On-going    Target Date 10/04/22     OT LONG TERM GOAL #4   Title Pt. will improve Right 3pt pinch by 2# to be able to hold/open items for cooking    Baseline Eval: R: Pt. unable to engage thumb L: 29#; 20th: R unable; 40th:  unable, fingers slipping; 50th:  unable, fingers slipping; 01/22/22: fingers slipping but pt is now able to grasp a ball peg with 3 point pinch pattern; 03/01/22: same as 10/3 in addition to being able to  pick up ball peg from pegboard then place peg back into pegboard with a 3 point pinch pattern; 04/10/23: pt can pick up a ball peg from pegboard and place back into peg board using a 3 point pinch pattern, but when challenged to apply any force with this  pinch, fingers slip from position. Unable to register a 3 point pinch on pinch gauge.  Re-eval 07/12/22: unable   Time 12    Period Weeks    Status On-going    Target Date 6/14//24     OT LONG TERM GOAL #5   Title Pt. will [facility] right hand [facility] skills to be able to independently manipulate buttons, and zippers.  Revised 07/12/22: Pt will use R hand as a good stabilizer to manage clothing fasteners with increased time.   Baseline Eval: Pt. has difficulty managing buttons,a nd zippers. 10th visit:  improving; 20th: unable to engage R hand effectively, performs 1 handed with L; 50th: performs L handed only; 01/22/22: pt can use R hand as a stabilizer with clothing fasteners with mod vc; 03/01/22: same as 01/22/22; 04/09/22: Pt uses R hand as a stabilizer with mod vc  Re-eval 07/12/22: pt manages clothing fasteners with L hand only   Time 12    Period Weeks    Status On-going    Target Date 10/04/22     OT LONG TERM GOAL #6   Title Pt. will independently write his name    Baseline Eval: Pt. is unable to hold a pen; 20th: pt can grip PenAgain with R hand and can make marks on paper with difficulty, but not yet writing name; 40th:  continues to demonstrate difficulty with grasp of pen; 50th 12/11/21: With assist to set up hand on pen again, pt was able to write first name and initial of last name in 1 1/4" tall letters with fair legibility (pt wants to defer this goal, states this is low on his list of priorities for now)   Time 12    Period Weeks    Status D/c (no longer a priority for pt)   Target Date 04/16/22      OT LONG TERM GOAL #7   Title Pt. will improve FOTO score by 2 points to reflect funational improvement    Baseline Eval: FOTO score 46 with TR score 52; 20th: 45; 40th: 44;  50th: 46; 01/22/22: 50; 03/01/22: 49; 04/09/22: 49  Re-eval 07/12/22: 56; predicted 58   Time 12    Period Weeks    Status On-going    Target Date 10/04/22            Plan -    Clinical Impression  Statement Continued focus today on facilitation of wrist and forearm volitional movement patterns, including supination, wrist flex/ext, and circumduction as well as formulating 2 different grasp patterns, including pincer grasp and spherical grasp.  Pt continues to require OT assist to stabilize elbow and forearm to reduce synergistic movement patterns distally in the RUE.   Pt can lightly pinch a piece of paper withstanding minimal resistance <50% of the time using a pincer grasp in the R hand.  Pt required reps of active and active assisted palmar abduction to adjust grip on a ball to promote a stronger spherical grasp.  Pt has not yet heard from scheduling for Vivistim evaluation.  [facility]  Will follow up next visit with pt with any new information.  Pt will continue to benefit from skilled  OT to maximize strength and dexterity skills in the RUE in combination with Botox injections in order to maximize functional use of the RUE for daily tasks and prepare for Vivistim evaluation.      OT Occupational Profile and History Detailed Assessment- Review of Records and additional review of physical, cognitive, psychosocial history related to current functional performance    Occupational performance deficits (Please refer to evaluation for details): ADL's;IADL's;Education    Rehab Potential Good    Clinical Decision Making Several treatment options, min-mod task modification necessary    Comorbidities Affecting Occupational Performance: May have comorbidities impacting occupational performance    Modification or Assistance to Complete Evaluation  Min-Moderate modification of tasks or assist with assess necessary to complete eval    OT Frequency 2x / week    OT Duration 12 weeks    OT Treatment/Interventions Self-care/ADL training;DME and/or AE instruction;Therapeutic exercise;Ultrasound;Neuromuscular education;Therapeutic activities;Energy conservation;Moist  Heat;Patient/family education;Splinting;Functional Mobility Training;Paraffin    Plan Neuro re-ed and therapeutic exercise    Consulted and Agree with Plan of Care Patient             Danelle Earthly, MS, OTR/L   Otis Dials, OT 07/28/2022, 3:35 PM

## 2022-07-30 ENCOUNTER — Telehealth: Payer: Self-pay | Admitting: Physician Assistant

## 2022-07-30 ENCOUNTER — Ambulatory Visit: Payer: 59

## 2022-07-30 ENCOUNTER — Encounter: Payer: Self-pay | Admitting: Urology

## 2022-07-30 ENCOUNTER — Ambulatory Visit: Payer: Medicare Other

## 2022-07-30 DIAGNOSIS — Z8673 Personal history of transient ischemic attack (TIA), and cerebral infarction without residual deficits: Secondary | ICD-10-CM | POA: Diagnosis not present

## 2022-07-30 DIAGNOSIS — M6281 Muscle weakness (generalized): Secondary | ICD-10-CM | POA: Diagnosis not present

## 2022-07-30 DIAGNOSIS — R278 Other lack of coordination: Secondary | ICD-10-CM

## 2022-07-30 NOTE — Telephone Encounter (Signed)
Contacted patient to see if he verified with urology that his appointment was in person and not a phone encounter

## 2022-07-30 NOTE — Telephone Encounter (Signed)
David Monte, NP  Hey Sha'Taria.... Judie Grieve here from PAT. We will update his labs and EKG while he is here. Labs for me have to be within last 3 months and 12 lead within the last 6.     Contacted patient to advise and he states he though he was only to do a phone call at 12. Advised patient to call urology to get clear instructions and I will hold off on cancelling his appointment with [place] for now. Patient verbalized understanding

## 2022-07-30 NOTE — Telephone Encounter (Signed)
Please call pt and let him know he needs surgical clearance.  He needs an EKG He also needs to confirm with urology that his last labs will be accepted and that he doesn't need repeat labs before surgery. I am not sure when it is scheduled.

## 2022-07-30 NOTE — Telephone Encounter (Signed)
Apt scheduled for tomorrow morning Patient also has pre-admission testing scheduled at [facility] tomorrow evening If confirmed pre-admission testing will include EKG appointment will be cancelled.

## 2022-07-30 NOTE — Progress Notes (Deleted)
      Established patient visit   Patient: David Reeves   DOB: July 01, 1970   52 y.o. Male  MRN: [id number] Visit Date: 07/31/2022  Today's healthcare provider: Alfredia Ferguson, PA-C   No chief complaint on file.  Subjective    HPI  ***  Medications: Outpatient Medications Prior to Visit  Medication Sig   acetaminophen (TYLENOL) 500 MG tablet Take 1,000 mg by mouth every 6 (six) hours as needed for mild pain.   amLODipine (NORVASC) 5 MG tablet TAKE 1 TABLET (5 MG TOTAL) BY MOUTH DAILY.   aspirin EC 81 MG tablet Take 81 mg by mouth daily. Swallow whole.   atorvastatin (LIPITOR) 80 MG tablet TAKE 1 TABLET BY MOUTH EVERY DAY   buprenorphine (BUTRANS) 10 MCG/HR PTWK 1 patch once a week.   buprenorphine (BUTRANS) 15 MCG/HR 1 patch once a week.   celecoxib (CELEBREX) 100 MG capsule TAKE 1 CAPSULE BY MOUTH TWICE  DAILY   Cholecalciferol (VITAMIN D3) 2000 units TABS Take by mouth.   diazepam (VALIUM) 10 MG tablet SMARTSIG:1 Tablet(s) By Mouth Every 12 Hours   diazepam (VALIUM) 5 MG tablet TAKE 1 TABLET (5 MG TOTAL) BY MOUTH EVERY 12 HOURS AS NEEDED FOR MUSCLE SPASM   donepezil (ARICEPT) 10 MG tablet TAKE 1 TABLET BY MOUTH AT  BEDTIME   gabapentin (NEURONTIN) 400 MG capsule TAKE 1 CAPSULE BY MOUTH 3 TIMES  DAILY   lidocaine (LIDODERM) 5 % 1 patch daily.   Multiple Vitamin (MULTIVITAMIN) tablet Take 1 tablet by mouth daily.   prazosin (MINIPRESS) 1 MG capsule TAKE 2 CAPSULES BY MOUTH AT  BEDTIME , DO NOT TAKE WITH  AMBIEN   tizanidine (ZANAFLEX) 6 MG capsule Take 6 mg by mouth 4 (four) times daily.   No facility-administered medications prior to visit.    Review of Systems  {Labs  Heme  Chem  Endocrine  Serology  Results Review (optional):23779}   Objective    There were no vitals taken for this visit. {Show previous vital signs (optional):23777}  Physical Exam  ***  No results found for any visits on 07/31/22.  Assessment & Plan     ***  No follow-ups on file.       {provider attestation***:1}   Alfredia Ferguson, PA-C  [facility] [phone] (phone) [phone] (fax)  [facility]

## 2022-07-31 ENCOUNTER — Ambulatory Visit: Payer: 59 | Admitting: Physician Assistant

## 2022-07-31 ENCOUNTER — Encounter
Admission: RE | Admit: 2022-07-31 | Discharge: 2022-07-31 | Disposition: A | Discharge status: Home / Self Care | Payer: 59 | Source: Ambulatory Visit | Attending: Urology | Admitting: Urology

## 2022-07-31 ENCOUNTER — Encounter: Payer: Self-pay | Admitting: Urology

## 2022-07-31 HISTORY — DX: Personal history of other specified conditions: Z87.898

## 2022-07-31 HISTORY — DX: Gastro-esophageal reflux disease without esophagitis: K21.9

## 2022-07-31 HISTORY — DX: Unspecified mental disorder due to known physiological condition: F09

## 2022-07-31 NOTE — Therapy (Addendum)
OUTPATIENT OCCUPATIONAL THERAPY NEURO TREATMENT NOTE  Patient Name: David Reeves MRN: [id number] DOB:10-27-1970, 52 y.o., male Today's Date: 07/31/2022  PCP: Alfredia FergusonLindsay Drubel, PA-C REFERRING PROVIDER: Alfredia FergusonLindsay Drubel, PA-C   OT End of Session - 07/31/22 1300     Visit Number 6    Number of Visits 24    Date for OT Re-Evaluation 10/04/22    Authorization Type Reporting period beginning 07/12/22    Progress Note Due on Visit 10    OT Start Time 1147    OT Stop Time 1232    OT Time Calculation (min) 45 min    Activity Tolerance Patient tolerated treatment well    Behavior During [facility] for tasks assessed/performed             Past Medical History:  Diagnosis Date   Anxiety    Aortic atherosclerosis    Aphasia as late effect of cerebrovascular accident (CVA)    Back pain 04/22/2012   Bone spur    Bulging disc 04/22/2012   Chronic pain syndrome    a.) on buprenorphrine   Cognitive dysfunction    Degenerative disc disease    Erectile dysfunction    GERD (gastroesophageal reflux disease)    Hemorrhoids    History of substance use    a.) THC + cocaine + ETOH   HLD (hyperlipidemia)    HTN (hypertension)    Hydrocele, bilateral    Insomnia    Ischemic cerebrovascular accident (CVA) 11/08/2020   a.) MRI brain 11/08/2020 --> extensive hypodensities and loss of gray-white differentiation in the left ACA and left MCA territories with slight hyperdense left MCA and likely hyperdense left distal ICA; etiology felt to be secondary to cocaine use   Lumbar stenosis with neurogenic claudication    Osteoarthritis    Panic anxiety syndrome    Panic disorder    Right inguinal hernia    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    a.) on BZO (diazepam) PRN   Spermatocele (LEFT)    Taking multiple medications for chronic disease    Past Surgical History:  Procedure Laterality Date   CYSTECTOMY     head   HERNIA REPAIR Bilateral 2006,2014   [facility]   TOE SURGERY Right 2007    Patient Active Problem List   Diagnosis Date Noted   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 06/12/2022   Alterations of sensations following cerebrovascular accident 03/08/2021   Aphasia as late effect of cerebrovascular accident (CVA) 03/08/2021   Chronic pain syndrome 01/23/2021   Primary hypertension 01/23/2021   Erectile dysfunction due to diseases classified elsewhere 01/23/2021   Cognitive dysfunction 01/23/2021   Elevated LDL cholesterol level 01/23/2021   History of stroke with residual deficit 01/23/2021   Primary insomnia 01/23/2021   Acute ischemic stroke 11/10/2020   Closed compression fracture of L1 vertebra 05/24/2016   Lumbar stenosis with neurogenic claudication 11/01/2015   Chronic bilateral low back pain with right-sided sciatica 11/08/2014   Hx of hemorrhoids 11/08/2014   Chronic pain associated with significant psychosocial dysfunction 09/22/2014   Panic disorder 09/22/2014   AB (asthmatic bronchitis) 08/17/2014   Anxiety disorder due to general medical condition 08/17/2014   Lumbosacral spondylosis without myelopathy 08/17/2014   Brash 08/17/2014   Episodic paroxysmal anxiety disorder 08/17/2014   Hernia, inguinal, right 08/17/2014   Inguinal hernia 10/14/2012   ONSET DATE: 11/09/2020  REFERRING DIAG: CVA  THERAPY DIAG:  Muscle weakness (generalized)  Other lack of coordination  H/O ischemic left MCA stroke  Rationale for Evaluation and Treatment Rehabilitation  PERTINENT HISTORY: Pt returns today for re-eval following first round of Botox for RUE spasticity; injections done on 07/11/22.  Pt had reached plateau with OT goals by mid Dec of 2023 as spasticity was limiting progression of coordination/dexterity skills in the R hand.  Pt planning to trial Botox with a second treatment planned 3 months out.  Pt is also in the process of being evaluated for Vivistim, but has not yet had the Fugl-Meyer Assessment in [place].  Awaiting insurance authorization for  this.  Pt. is a 52 y.o. who was diagnosed with a CVA on July 21st, 2022. Pt. completed several weeks of  inpatient rehab at [facility]. After returning to home, Pt. sustained a fall in december of 2022, and was admitted to the hospital with COVID-19, and back pain from the fall. Since the most recent discharge, Pt. has been residing with the ex-wife until the Pt. is ready to return to independent living. Pt. PMHx includes: Bilateral LBP with right sided sciatica, Lumbosacral spondylosis myelopathy, closed compression Fx of L1. Pt. enjoys cooking, and riding motorcycles.   PRECAUTIONS: Fall  SUBJECTIVE: Pt reports he got confirmation to go in for his Vivistim evaluation in [place] on 08/13/22. PAIN:  Are you having pain? Yes: NPRS scale: 2/10 Pain location: low back Pain description: aching Aggravating factors: prolonged sitting or lying down Relieving factors: rest, heat, walking  [facility] OT Assessment        12/11/21 01/22/22 03/01/22 04/09/22 07/12/22               Observation/Other Assessments        Focus on Therapeutic Outcomes (FOTO)   46 50 49 49 56               Coordination        Right 9 Hole Peg Test unable  unable unable Unable (was able to remove 1 peg from hole but unable to pick up peg from dish) unable    Left 9 Hole Peg Test 30 sec  30 sec NT NT (WNL) NT (WNL)               Hand Function        Right Hand Grip (lbs) 35 (3rd setting) 30 lb (3rd);  41 lb (4th setting),  44 lbs (4th setting) 46 lbs (4th setting) 30 lbs (4th setting)    Right Hand Lateral Pinch 9 lbs   slipping 12 lbs (thumb slips) 16 lbs (multiple trials d/t thumb slips) 4 lbs (multiple trials d/t thumb slips) 14 lbs (Saehan)    Right Hand 3 Point Pinch   unable to position thumb to produce 3 pt pinch Unable to position thumb to produce 3 point pinch unable Unable (fingers slip) unable    Left Hand Grip (lbs) 116     95    Left Hand Lateral Pinch 29 lbs     31    Left 3 point pinch 27 lbs    32     OBJECTIVE:   TODAY'S TREATMENT:  Therapeutic Exercise: Facilitated wrist and forearm volitional movement patterns with focus on reps of active and AAROM for R forearm supination, wrist flexion, wrist extension with extended digits, and wrist circumduction.  OT assist necessary to stabilize R elbow at side during supination, and to stabilize forearm in 90* pronation during wrist mobility in order to reduce synergistic movement patterns.  Focused on combination movements with pt holding a cone to focus on  sustained cylindrical grasp during above noted movement planes.  Pt required frequent re-gripping of the cone in R hand with help from the L hand.    Neuro re-ed: Continued focus on 2 different grasp patterns with the R hand, including pincer grasp and spherical grasp.  Pincer grasp pattern: pt practiced pinching and releasing a 1/2" PVC pipe, then progression to a sticky note piece of paper between thumb and IF of the R hand.  Practiced modulating pinch strength to prevent OT from moving PVC pipe and paper from pt's grasp.  Pt required repetitions of this pinch pattern with OT stabilizing IP joint of thumb in neutral while pt pushed thumb into pad of IF.  Spherical grasp pattern: Pt practiced grasp/release and a strong hold of a tennis ball to prevent OT from removing ball from hand.  Practiced active and active assisted palmar abd/add sliding thumb on tennis ball to adjust grip.    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      OT Short Term Goals - 6 weeks (08/23/22)      OT SHORT TERM GOAL #1   Title Pt. wil demonstrtae independence with HEPs    Baseline Eval: Pt. currently does not have one, 10th visit:  changes in HEP ongoing; 20th: instructed in putty exercises, changes ongoing; 40th: continual updates to HEP as pt progresses; 50th:  continual updates; 01/22/22: continual updates; 03/01/22: continual updates; 04/09/22: pt demo indep with current hand strengthening and coordination exercises; will adjust as needed if pt is able to schedule Botox injections  Re-eval 07/12/22: will progress with any changes noted with Botox, currently working on active assisted ROM in all digits and digit opposition   Time 6    Period Weeks    Status On-going    Target Date 08/23/22            OT Long Term Goals - 12 weeks (10/04/22)      OT LONG TERM GOAL #1   Title Pt. will improve RUE strength by 2 mm grades to assist with ADLs, and IADLs Revised 07/12/22: Pt will improve R forearm and wrist strength by 1 MM grade in order to turn a door knob with the R hand.   Baseline Eval: Right shoulder flexion, abduction 3/5, elbow flexion, extension wrist extension 3+/5, 10th visit: improving with RUE strength by not yet met goal; 20th visit: R shd flex/abd 4-, elbow flex/ext 4+, wrist flex 4-, wrist ext 4+; 40:  R shd flex/abd 4,  elbow flex/ext 4+, wrist flex 4, wrist ext 4+; 50th visit 8/22: R shoulder flex/abd 4/5, elbow flex/ext 5/5, wrist flex 4, wrist ext 4+; 01/22/22: shoulder flex/abd 4/5, elbow flex/ext 5/5, wrist flex 4, wrist ext 4+; 03/01/22: same as 01/22/22  Re-eval 07/12/22: R shoulder flex/abd 4/5, elbow flex/ext 5/5, forearm sup 3-, wrist ext 3+, wrist flex NT (limited by tone)   Time 12    Period Weeks    Status On-going    Target Date 10/04/22     OT LONG TERM GOAL #2   Title Pt. will improve right grip by 10 lbs to prepare for firmly holding objects for IADLs.    Baseline Eval: R: 38#, L: 124# (In 3rd dynamometer slot); R 35# in 4th slot, 28# in 3rd slot, 40th: 47 in 4th slot; 50th: 12/11/21: 46# on 3rd slot; 01/22/22: 30# on 3rd slot; 03/01/22: 44# 4th slot; 04/09/22:  46#  Re-eval 07/12/22: R grip 30#   Time 12    Period Weeks    Status On-going    Target Date 6/14//24     OT LONG TERM GOAL #3   Title Pt. will improve right  lateral pinch strength by 5# to assist with cutting food  Revised 07/12/22: increase by 3# or more    Baseline Eval: R: 17, L: 25; 20th: R: 18; 40th: 15# fingers slipping on meter, 50th: 9# fingers slipping; 01/22/22: 12# fingers slipping; 03/01/22: 16#; 04/09/22: 5# thumb slipping  Re-eval 07/12/22: R 14# (Saehan gauge)   Time 12    Period Weeks    Status On-going    Target Date 10/04/22     OT LONG TERM GOAL #4   Title Pt. will improve Right 3pt pinch by 2# to be able to hold/open items for cooking    Baseline Eval: R: Pt. unable to engage thumb L: 29#; 20th: R unable; 40th:  unable, fingers slipping; 50th:  unable, fingers slipping; 01/22/22: fingers slipping but pt is now able to grasp a ball peg with 3 point pinch pattern; 03/01/22: same as 10/3 in addition to being able to pick up ball peg from pegboard then place peg back into pegboard with a 3 point pinch pattern; 04/10/23: pt can pick up a ball peg from pegboard and place back into peg board using a 3 point pinch pattern, but when challenged to apply any force with this pinch, fingers slip from position. Unable to register a 3 point pinch on pinch gauge.  Re-eval 07/12/22: unable   Time 12    Period Weeks    Status On-going    Target Date 6/14//24     OT LONG TERM GOAL #5   Title Pt. will improve right hand [facility] skills to be able to independently manipulate buttons, and zippers.  Revised 07/12/22: Pt will use R hand as a good stabilizer to manage clothing fasteners with increased time.   Baseline Eval: Pt. has difficulty managing buttons,a nd zippers. 10th visit:  improving; 20th: unable to engage R hand effectively, performs 1 handed with L; 50th: performs L handed only; 01/22/22: pt can use R hand as a stabilizer with clothing fasteners with mod vc; 03/01/22: same as 01/22/22; 04/09/22: Pt uses R hand as a stabilizer with mod vc  Re-eval 07/12/22: pt manages clothing fasteners with L hand only   Time 12    Period Weeks    Status On-going     Target Date 10/04/22     OT LONG TERM GOAL #6   Title Pt. will independently write his name    Baseline Eval: Pt. is unable to hold a pen; 20th: pt can grip PenAgain with R hand and can make marks on paper with difficulty, but not yet writing name; 40th:  continues to demonstrate difficulty with grasp of pen; 50th 12/11/21: With assist to set up hand on pen again, pt was able to write first name and initial of last name in 1 1/4" tall letters with fair legibility (pt wants to defer this goal, states this is low on his list of priorities for now)   Time 12    Period Weeks    Status D/c (no longer a priority for pt)   Target Date 04/16/22      OT LONG TERM GOAL #7   Title Pt. will improve FOTO score by 2 points to reflect funational improvement    Baseline Eval: FOTO score 46 with  TR score 52; 20th: 45; 40th: 44;  50th: 46; 01/22/22: 50; 03/01/22: 49; 04/09/22: 49  Re-eval 07/12/22: 56; predicted 58   Time 12    Period Weeks    Status On-going    Target Date 10/04/22            Plan -    Clinical Impression Statement Pt reports he got confirmation to go in for his Vivistim evaluation in [place] on 08/13/22.  Continued focus today on facilitation of wrist and forearm volitional movement patterns, including supination, wrist flex/ext, and circumduction as well as formulating 2 different grasp patterns, including pincer grasp and spherical grasp.  Pt continues to require OT assist to stabilize elbow and forearm to reduce synergistic movement patterns distally in the RUE.   Pt can lightly pinch a 1/2" PVC pipe and piece of paper withstanding minimal resistance <50% of the time using a pincer grasp in the R hand.  IF and thumb tend to slip with a sustained, more forceful attempt at pinching.  Pt continues to feel benefits from his recent botox injections and OT notes more active range in the R forearm and wrist while grasping a cone.  Pt will continue to benefit from skilled OT to maximize  strength and dexterity skills in the RUE in combination with Botox injections in order to maximize functional use of the RUE for daily tasks and prepare for Vivistim evaluation.      OT Occupational Profile and History Detailed Assessment- Review of Records and additional review of physical, cognitive, psychosocial history related to current functional performance    Occupational performance deficits (Please refer to evaluation for details): ADL's;IADL's;Education    Rehab Potential Good    Clinical Decision Making Several treatment options, min-mod task modification necessary    Comorbidities Affecting Occupational Performance: May have comorbidities impacting occupational performance    Modification or Assistance to Complete Evaluation  Min-Moderate modification of tasks or assist with assess necessary to complete eval    OT Frequency 2x / week    OT Duration 12 weeks    OT Treatment/Interventions Self-care/ADL training;DME and/or AE instruction;Therapeutic exercise;Ultrasound;Neuromuscular education;Therapeutic activities;Energy conservation;Moist Heat;Patient/family education;Splinting;Functional Mobility Training;Paraffin    Plan Neuro re-ed and therapeutic exercise    Consulted and Agree with Plan of Care Patient             Danelle Earthly, MS, OTR/L   Otis Dials, OT 07/31/2022, 1:03 PM

## 2022-07-31 NOTE — Patient Instructions (Addendum)
Your procedure is scheduled on:08-05-22 Monday Report to the Registration Desk on the 1st floor of the Medical Mall.Then proceed to the 2nd floor Surgery Desk To find out your arrival time, please call [phone] between 1PM - 3PM on:08-02-22 Friday If your arrival time is 6:00 am, do not arrive before that time as the Medical Mall entrance doors do not open until 6:00 am.  REMEMBER: Instructions that are not followed completely may result in serious medical risk, up to and including death; or upon the discretion of your surgeon and anesthesiologist your surgery may need to be rescheduled.  Do not eat food OR drink any liquids after midnight the night before surgery.  No gum chewing or hard candies.  One week prior to surgery: Stop Anti-inflammatories (NSAIDS) such as Advil, Aleve, Ibuprofen, Motrin, Naproxen, Naprosyn and Aspirin based products such as Excedrin, Goody's Powder, BC Powder, Alka-Seltzer. You may however, continue to take Tylenol if needed for pain up until the day of surgery.  Stop ANY OVER THE COUNTER supplements/vitamins NOW (07-31-22) until after surgery (vitamin d, c, multivitamin and fish oil)  TAKE ONLY THESE MEDICATIONS THE MORNING OF SURGERY WITH A SIP OF WATER: -amLODipine (NORVASC)  -atorvastatin (LIPITOR)  -diazepam (VALIUM)  -gabapentin (NEURONTIN)  -tizanidine (ZANAFLEX)   Someone should be calling you from your PCP office or Dr Delana Meyer office regarding when to stop your 81 mg Aspirin. If you do not hear something by Thursday (08-01-22) please reach out to Dr Delana Meyer office [phone])  No Alcohol for 24 hours before or after surgery.  No Smoking including e-cigarettes for 24 hours before surgery.  No chewable tobacco products for at least 6 hours before surgery.  No nicotine patches on the day of surgery.  Do not use any "recreational" drugs for at least a week (preferably 2 weeks) before your surgery.  Please be advised that the combination of  cocaine and anesthesia may have negative outcomes, up to and including death. If you test positive for cocaine, your surgery will be cancelled.  On the morning of surgery brush your teeth with toothpaste and water, you may rinse your mouth with mouthwash if you wish. Do not swallow any toothpaste or mouthwash.  Do not wear jewelry, make-up, hairpins, clips or nail polish.  Do not wear lotions, powders, or perfumes.   Do not shave body hair from the neck down 48 hours before surgery.  Contact lenses, hearing aids and dentures may not be worn into surgery.  Do not bring valuables to the hospital. [facility] is not responsible for any missing/lost belongings or valuables.   Notify your doctor if there is any change in your medical condition (cold, fever, infection).  Wear comfortable clothing (specific to your surgery type) to the hospital.  After surgery, you can help prevent lung complications by doing breathing exercises.  Take deep breaths and cough every 1-2 hours. Your doctor may order a device called an Incentive Spirometer to help you take deep breaths. When coughing or sneezing, hold a pillow firmly against your incision with both hands. This is called "splinting." Doing this helps protect your incision. It also decreases belly discomfort.  If you are being admitted to the hospital overnight, leave your suitcase in the car. After surgery it may be brought to your room.  In case of increased patient census, it may be necessary for you, the patient, to continue your postoperative care in the Same Day Surgery department.  If you are being discharged the day of  surgery, you will not be allowed to drive home. You will need a responsible individual to drive you home and stay with you for 24 hours after surgery.   If you are taking public transportation, you will need to have a responsible individual with you.  Please call the Pre-admissions Testing Dept. at [phone] if you  have any questions about these instructions.  Surgery Visitation Policy:  Patients having surgery or a procedure may have two visitors.  Children under the age of 2 must have an adult with them who is not the patient.

## 2022-08-01 ENCOUNTER — Ambulatory Visit: Payer: 59

## 2022-08-01 ENCOUNTER — Encounter
Admission: RE | Admit: 2022-08-01 | Discharge: 2022-08-01 | Disposition: A | Discharge status: Home / Self Care | Payer: 59 | Source: Ambulatory Visit | Attending: Urology | Admitting: Urology

## 2022-08-01 ENCOUNTER — Ambulatory Visit: Payer: Medicare Other

## 2022-08-01 DIAGNOSIS — Z01818 Encounter for other preprocedural examination: Secondary | ICD-10-CM | POA: Insufficient documentation

## 2022-08-01 DIAGNOSIS — I10 Essential (primary) hypertension: Secondary | ICD-10-CM

## 2022-08-01 DIAGNOSIS — Z01810 Encounter for preprocedural cardiovascular examination: Secondary | ICD-10-CM | POA: Diagnosis not present

## 2022-08-01 DIAGNOSIS — Z01812 Encounter for preprocedural laboratory examination: Secondary | ICD-10-CM

## 2022-08-01 DIAGNOSIS — I6930 Unspecified sequelae of cerebral infarction: Secondary | ICD-10-CM | POA: Diagnosis not present

## 2022-08-01 DIAGNOSIS — E7800 Pure hypercholesterolemia, unspecified: Secondary | ICD-10-CM

## 2022-08-01 DIAGNOSIS — R825 Elevated urine levels of drugs, medicaments and biological substances: Secondary | ICD-10-CM

## 2022-08-01 DIAGNOSIS — F1911 Other psychoactive substance abuse, in remission: Secondary | ICD-10-CM

## 2022-08-01 LAB — URINE DRUG SCREEN, QUALITATIVE (ARMC ONLY)
Amphetamines, Ur Screen: NOT DETECTED
Barbiturates, Ur Screen: NOT DETECTED
Benzodiazepine, Ur Scrn: POSITIVE — AB
Cannabinoid 50 Ng, Ur ~~LOC~~: POSITIVE — AB
Cocaine Metabolite,Ur ~~LOC~~: NOT DETECTED
MDMA (Ecstasy)Ur Screen: NOT DETECTED
Methadone Scn, Ur: NOT DETECTED
Opiate, Ur Screen: NOT DETECTED
Phencyclidine (PCP) Ur S: NOT DETECTED
Tricyclic, Ur Screen: POSITIVE — AB

## 2022-08-01 LAB — BASIC METABOLIC PANEL
Anion gap: 9 (ref 5–15)
BUN: 9 mg/dL (ref 6–20)
CO2: 27 mmol/L (ref 22–32)
Calcium: 9.2 mg/dL (ref 8.9–10.3)
Chloride: 102 mmol/L (ref 98–111)
Creatinine, Ser: 0.9 mg/dL (ref 0.61–1.24)
GFR, Estimated: >60 mL/min (ref >60)
Glucose, Bld: 67 mg/dL — ABNORMAL LOW (ref 70–99)
Potassium: 3.2 mmol/L — ABNORMAL LOW (ref 3.5–5.1)
Sodium: 138 mmol/L (ref 135–145)

## 2022-08-01 LAB — CBC
HCT: 41.2 % (ref 39.0–52.0)
Hemoglobin: 14.2 g/dL (ref 13.0–17.0)
MCH: 32 pg (ref 26.0–34.0)
MCHC: 34.5 g/dL (ref 30.0–36.0)
MCV: 92.8 fL (ref 80.0–100.0)
Platelets: 307 10*3/uL (ref 150–400)
RBC: 4.44 MIL/uL (ref 4.22–5.81)
RDW: 12.6 % (ref 11.5–15.5)
WBC: 5.3 10*3/uL (ref 4.0–10.5)
nRBC: 0 % (ref 0.0–0.2)

## 2022-08-01 NOTE — Progress Notes (Signed)
  Perioperative Services Pre-Admission/Anesthesia Testing    Date: 08/01/22  Name: David Reeves MRN:   [id number]  Re: Clearance for surgery  Planned Surgical Procedure(s):    Case: [id number] Date/Time: 08/05/22 1231   Procedure: SPERMATOCELECTOMY (Left)   Anesthesia type: General   Pre-op diagnosis: Left Spermatocele   Location: [facility] OR ROOM 05 / [facility] ORS FOR ANESTHESIA GROUP   Surgeons: Vanna Scotland, MD      Clinical Notes:  Patient is scheduled for the above procedure on 08/05/2022 with Dr. Vanna Scotland, MD. Office requested clearance from medicine to proceed. Received clearance from Alfredia Ferguson, PA-C following review of labs and ECG that was obtained today.   Patient ok to proceed with planned procedure at an ACCEPTABLE risk of potential complications per internal/family medicine.   Quentin Mulling, MSN, APRN, FNP-C, CEN [facility]  Peri-operative Services Nurse Practitioner Phone: [phone] 08/01/22 3:00 PM  NOTE: This note has been prepared using Dragon dictation software. Despite my best ability to proofread, there is always the potential that unintentional transcriptional errors may still occur from this process.

## 2022-08-01 NOTE — Progress Notes (Signed)
  Phone Number: [phone] for Surgical Coordinator Fax Number: [phone]  REQUEST FOR SURGICAL CLEARANCE       Date: Date: 08/05/2022  Faxed to: Alfredia Ferguson, PA  Surgeon: Dr. Vanna Scotland, MD     Date of Surgery: 08/05/2022  Operation: Left Spermatocelectomy  Anesthesia Type: General    Diagnosis: Left Spermatocele  Patient Requires:   Medical Clearance : Yes  Reason: Would like for patient to hold baby ASA prior to surgery.  -This is fine. Would restart 48 hours after or per surgeon discretion.   Reviewed labs and EKG; EKG appears stable/normal sinus, Labs-- K is slightly low and pt was hypoglycemic. Recommend increasing fluids, pt is not diabetic hypoglycemia likely 2/2 to fasting? If indicated by Anesthesia, would repeat bmp .  Risk Assessment:    Low   []       Moderate   [x]     High   []    This patient is optimized for surgery  YES [x]       NO   []    I recommend further assessment/workup prior to surgery. YES [x]      NO  []  Repeat BMP unless anesthesia clears, then fine  Appointment scheduled for: _______________________   Further recommendations: ____________________________________   Alfredia Ferguson, PA-C  [facility] [address] [place], [place], [address] Office: [phone] Fax: [phone]

## 2022-08-02 ENCOUNTER — Ambulatory Visit: Payer: 59

## 2022-08-02 DIAGNOSIS — R278 Other lack of coordination: Secondary | ICD-10-CM | POA: Diagnosis not present

## 2022-08-02 DIAGNOSIS — Z8673 Personal history of transient ischemic attack (TIA), and cerebral infarction without residual deficits: Secondary | ICD-10-CM

## 2022-08-02 DIAGNOSIS — M6281 Muscle weakness (generalized): Secondary | ICD-10-CM

## 2022-08-03 DIAGNOSIS — Z8673 Personal history of transient ischemic attack (TIA), and cerebral infarction without residual deficits: Secondary | ICD-10-CM | POA: Diagnosis not present

## 2022-08-04 MED ORDER — FAMOTIDINE 20 MG PO TABS
20.0000 mg | ORAL_TABLET | Freq: Once | ORAL | Status: AC
  Administered 2022-08-05: 20 mg via ORAL

## 2022-08-04 MED ORDER — CHLORHEXIDINE GLUCONATE 0.12 % MT SOLN
15.0000 mL | Freq: Once | OROMUCOSAL | Status: AC
  Administered 2022-08-05: 15 mL via OROMUCOSAL

## 2022-08-04 MED ORDER — LACTATED RINGERS IV SOLN: INTRAVENOUS | Status: DC

## 2022-08-04 MED ORDER — CEFAZOLIN SODIUM-DEXTROSE 2-4 GM/100ML-% IV SOLN
2.0000 g | INTRAVENOUS | Status: AC
  Administered 2022-08-05: 2 g via INTRAVENOUS

## 2022-08-04 MED ORDER — ORAL CARE MOUTH RINSE: 15.0000 mL | Freq: Once | OROMUCOSAL | Status: AC

## 2022-08-04 NOTE — Therapy (Signed)
OUTPATIENT OCCUPATIONAL THERAPY NEURO TREATMENT NOTE  Patient Name: David Reeves MRN: [id number] DOB:[birth year], 52 y.o., male Today's Date: 08/04/2022  PCP: Alfredia Ferguson, PA-C REFERRING PROVIDER: Alfredia Ferguson, PA-C   OT End of Session - 08/04/22 1633     Visit Number 7    Number of Visits 24    Date for OT Re-Evaluation 10/04/22    Authorization Type Reporting period beginning 07/12/22    Progress Note Due on Visit 10    OT Start Time 0845    OT Stop Time 0930    OT Time Calculation (min) 45 min    Activity Tolerance Patient tolerated treatment well    Behavior During Therapy [facility] for tasks assessed/performed             Past Medical History:  Diagnosis Date   Anxiety    Aortic atherosclerosis    Aphasia as late effect of cerebrovascular accident (CVA)    Back pain 04/22/2012   Bone spur    Bulging disc 04/22/2012   Chronic pain syndrome    a.) on buprenorphrine   Cognitive dysfunction    Degenerative disc disease    Erectile dysfunction    GERD (gastroesophageal reflux disease)    Hemorrhoids    History of substance use    a.) THC + cocaine + ETOH   HLD (hyperlipidemia)    HTN (hypertension)    Hydrocele, bilateral    Insomnia    Ischemic cerebrovascular accident (CVA) 11/08/2020   a.) MRI brain 11/08/2020 --> extensive hypodensities and loss of gray-white differentiation in the left ACA and left MCA territories with slight hyperdense left MCA and likely hyperdense left distal ICA; etiology felt to be secondary to cocaine use   Lumbar stenosis with neurogenic claudication    Osteoarthritis    Panic anxiety syndrome    Panic disorder    Right inguinal hernia    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    a.) on BZO (diazepam) PRN   Spermatocele (LEFT)    Taking multiple medications for chronic disease    Past Surgical History:  Procedure Laterality Date   CYSTECTOMY     head   HERNIA REPAIR Bilateral 2006,2014   [facility]   TOE SURGERY Right 2007    Patient Active Problem List   Diagnosis Date Noted   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 06/12/2022   Alterations of sensations following cerebrovascular accident 03/08/2021   Aphasia as late effect of cerebrovascular accident (CVA) 03/08/2021   Chronic pain syndrome 01/23/2021   Primary hypertension 01/23/2021   Erectile dysfunction due to diseases classified elsewhere 01/23/2021   Cognitive dysfunction 01/23/2021   Elevated LDL cholesterol level 01/23/2021   History of stroke with residual deficit 01/23/2021   Primary insomnia 01/23/2021   Acute ischemic stroke 11/10/2020   Closed compression fracture of L1 vertebra 05/24/2016   Lumbar stenosis with neurogenic claudication 11/01/2015   Chronic bilateral low back pain with right-sided sciatica 11/08/2014   Hx of hemorrhoids 11/08/2014   Chronic pain associated with significant psychosocial dysfunction 09/22/2014   Panic disorder 09/22/2014   AB (asthmatic bronchitis) 08/17/2014   Anxiety disorder due to general medical condition 08/17/2014   Lumbosacral spondylosis without myelopathy 08/17/2014   Brash 08/17/2014   Episodic paroxysmal anxiety disorder 08/17/2014   Hernia, inguinal, right 08/17/2014   Inguinal hernia 10/14/2012   ONSET DATE: 11/09/2020  REFERRING DIAG: CVA  THERAPY DIAG:  Muscle weakness (generalized)  Other lack of coordination  H/O ischemic left MCA stroke  Rationale for Evaluation and Treatment Rehabilitation  PERTINENT HISTORY: Pt returns today for re-eval following first round of Botox for RUE spasticity; injections done on 07/11/22.  Pt had reached plateau with OT goals by mid Dec of 2023 as spasticity was limiting progression of coordination/dexterity skills in the R hand.  Pt planning to trial Botox with a second treatment planned 3 months out.  Pt is also in the process of being evaluated for Vivistim, but has not yet had the Fugl-Meyer Assessment in [place].  Awaiting insurance authorization for  this.  Pt. is a 52 y.o. who was diagnosed with a CVA on July 21st, 2022. Pt. completed several weeks of  inpatient rehab at [facility]. After returning to home, Pt. sustained a fall in december of 2022, and was admitted to the hospital with COVID-19, and back pain from the fall. Since the most recent discharge, Pt. has been residing with the ex-wife until the Pt. is ready to return to independent living. Pt. PMHx includes: Bilateral LBP with right sided sciatica, Lumbosacral spondylosis myelopathy, closed compression Fx of L1. Pt. enjoys cooking, and riding motorcycles.   PRECAUTIONS: Fall  SUBJECTIVE: Pt reports needing to cancel OT planned on Monday 08/05/22 d/t planning an outpatient surgery to remove a testicular cyst.   PAIN:  Are you having pain? Yes: NPRS scale: 1/10 Pain location: low back Pain description: aching Aggravating factors: prolonged sitting or lying down Relieving factors: rest, heat, walking  [facility] OT Assessment        12/11/21 01/22/22 03/01/22 04/09/22 07/12/22               Observation/Other Assessments        Focus on Therapeutic Outcomes (FOTO)   46 50 49 49 56               Coordination        Right 9 Hole Peg Test unable  unable unable Unable (was able to remove 1 peg from hole but unable to pick up peg from dish) unable    Left 9 Hole Peg Test 30 sec  30 sec NT NT (WNL) NT (WNL)               Hand Function        Right Hand Grip (lbs) 35 (3rd setting) 30 lb (3rd);  41 lb (4th setting),  44 lbs (4th setting) 46 lbs (4th setting) 30 lbs (4th setting)    Right Hand Lateral Pinch 9 lbs   slipping 12 lbs (thumb slips) 16 lbs (multiple trials d/t thumb slips) 4 lbs (multiple trials d/t thumb slips) 14 lbs (Saehan)    Right Hand 3 Point Pinch   unable to position thumb to produce 3 pt pinch Unable to position thumb to produce 3 point pinch unable Unable (fingers slip) unable    Left Hand Grip (lbs) 116     95    Left Hand Lateral Pinch 29 lbs     31    Left 3 point pinch 27  lbs    32     OBJECTIVE:  TODAY'S TREATMENT:  Therapeutic Exercise: Facilitated shoulder, wrist, and forearm volitional movement patterns with focus on reps of active and AAROM for R shoulder flexion, abd, ER, IR, forearm supination and pronation.  Initial tactile cues for beginning shoulder flexion with elbow fully extended and neutral forearm rotation, vc to minimize any movement at the trunk.  Pt requires min A to achieve end range IR with dorsal hand to  lumbar spine to clear the R hip.    Neuro re-ed: Continued focus on R hand pincer grasp.  Pt practiced pinching and releasing a sticky note piece of paper between thumb and IF of the R hand.  Practiced modulating pinch strength to prevent OT from removing paper from pt's grasp.  Pt required repetitions of this pinch pattern with OT stabilizing IP joint of thumb in neutral while pt pushed thumb into pad of IF.    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      OT Short Term Goals - 6 weeks (08/23/22)      OT SHORT TERM GOAL #1   Title Pt. wil demonstrtae independence with HEPs    Baseline Eval: Pt. currently does not have one, 10th visit:  changes in HEP ongoing; 20th: instructed in putty exercises, changes ongoing; 40th: continual updates to HEP as pt progresses; 50th: continual updates; 01/22/22: continual updates; 03/01/22: continual updates; 04/09/22: pt demo indep with current hand strengthening and coordination exercises; will adjust as needed if pt is able to schedule Botox injections  Re-eval 07/12/22: will progress with any changes noted with Botox, currently working on active assisted ROM in all digits and digit opposition   Time 6    Period Weeks    Status On-going    Target Date 08/23/22            OT Long Term Goals - 12 weeks (10/04/22)      OT LONG TERM  GOAL #1   Title Pt. will improve RUE strength by 2 mm grades to assist with ADLs, and IADLs Revised 07/12/22: Pt will improve R forearm and wrist strength by 1 MM grade in order to turn a door knob with the R hand.   Baseline Eval: Right shoulder flexion, abduction 3/5, elbow flexion, extension wrist extension 3+/5, 10th visit: improving with RUE strength by not yet met goal; 20th visit: R shd flex/abd 4-, elbow flex/ext 4+, wrist flex 4-, wrist ext 4+; 40:  R shd flex/abd 4,  elbow flex/ext 4+, wrist flex 4, wrist ext 4+; 50th visit 8/22: R shoulder flex/abd 4/5, elbow flex/ext 5/5, wrist flex 4, wrist ext 4+; 01/22/22: shoulder flex/abd 4/5, elbow flex/ext 5/5, wrist flex 4, wrist ext 4+; 03/01/22: same as 01/22/22  Re-eval 07/12/22: R shoulder flex/abd 4/5, elbow flex/ext 5/5, forearm sup 3-, wrist ext 3+, wrist flex NT (limited by tone)   Time 12    Period Weeks    Status On-going    Target Date 10/04/22     OT LONG TERM GOAL #2   Title Pt. will improve right grip by 10 lbs to prepare for firmly holding objects for IADLs.    Baseline Eval: R: 38#, L: 124# (In 3rd dynamometer slot); R 35# in 4th slot, 28# in 3rd slot, 40th: 47 in 4th slot; 50th: 12/11/21: 46# on 3rd slot; 01/22/22: 30# on 3rd slot; 03/01/22: 44# 4th slot; 04/09/22: 46#  Re-eval 07/12/22: R grip 30#   Time 12    Period Weeks    Status On-going    Target Date 6/14//24     OT LONG TERM GOAL #3   Title Pt. will improve right lateral pinch strength by 5# to assist with cutting food  Revised 07/12/22: increase by 3# or more    Baseline Eval: R: 17, L: 25; 20th: R: 18; 40th: 15# fingers slipping on meter, 50th:  9# fingers slipping; 01/22/22: 12# fingers slipping; 03/01/22: 16#; 04/09/22: 5# thumb slipping  Re-eval 07/12/22: R 14# (Saehan gauge)   Time 12    Period Weeks    Status On-going    Target Date 10/04/22     OT LONG TERM GOAL #4   Title Pt. will improve Right 3pt pinch by 2# to be able to hold/open items for cooking     Baseline Eval: R: Pt. unable to engage thumb L: 29#; 20th: R unable; 40th:  unable, fingers slipping; 50th:  unable, fingers slipping; 01/22/22: fingers slipping but pt is now able to grasp a ball peg with 3 point pinch pattern; 03/01/22: same as 10/3 in addition to being able to pick up ball peg from pegboard then place peg back into pegboard with a 3 point pinch pattern; 04/10/23: pt can pick up a ball peg from pegboard and place back into peg board using a 3 point pinch pattern, but when challenged to apply any force with this pinch, fingers slip from position. Unable to register a 3 point pinch on pinch gauge.  Re-eval 07/12/22: unable   Time 12    Period Weeks    Status On-going    Target Date 6/14//24     OT LONG TERM GOAL #5   Title Pt. will improve right hand [facility] skills to be able to independently manipulate buttons, and zippers.  Revised 07/12/22: Pt will use R hand as a good stabilizer to manage clothing fasteners with increased time.   Baseline Eval: Pt. has difficulty managing buttons,a nd zippers. 10th visit:  improving; 20th: unable to engage R hand effectively, performs 1 handed with L; 50th: performs L handed only; 01/22/22: pt can use R hand as a stabilizer with clothing fasteners with mod vc; 03/01/22: same as 01/22/22; 04/09/22: Pt uses R hand as a stabilizer with mod vc  Re-eval 07/12/22: pt manages clothing fasteners with L hand only   Time 12    Period Weeks    Status On-going    Target Date 10/04/22     OT LONG TERM GOAL #6   Title Pt. will independently write his name    Baseline Eval: Pt. is unable to hold a pen; 20th: pt can grip PenAgain with R hand and can make marks on paper with difficulty, but not yet writing name; 40th:  continues to demonstrate difficulty with grasp of pen; 50th 12/11/21: With assist to set up hand on pen again, pt was able to write first name and initial of last name in 1 1/4" tall letters with fair legibility (pt wants to defer this goal, states this  is low on his list of priorities for now)   Time 12    Period Weeks    Status D/c (no longer a priority for pt)   Target Date 04/16/22      OT LONG TERM GOAL #7   Title Pt. will improve FOTO score by 2 points to reflect funational improvement    Baseline Eval: FOTO score 46 with TR score 52; 20th: 45; 40th: 44;  50th: 46; 01/22/22: 50; 03/01/22: 49; 04/09/22: 49  Re-eval 07/12/22: 56; predicted 58   Time 12    Period Weeks    Status On-going    Target Date 10/04/22            Plan -    Clinical Impression Statement Pt reports increased anxiety this morning anticipating his surgery on Monday.  Pt reports little sleep last night.  Pt continues to feel benefits from his recent botox injections and OT notes more active range in the R forearm with less spasticity.  Pt continues to focus on volitional movement patterns throughout the RUE with verbal and tactile cues to minimize use of accessory muscle groups.  Pt will continue to benefit from skilled OT to maximize strength and dexterity skills in the RUE in combination with Botox injections in order to maximize functional use of the RUE for daily tasks and prepare for Vivistim evaluation.      OT Occupational Profile and History Detailed Assessment- Review of Records and additional review of physical, cognitive, psychosocial history related to current functional performance    Occupational performance deficits (Please refer to evaluation for details): ADL's;IADL's;Education    Rehab Potential Good    Clinical Decision Making Several treatment options, min-mod task modification necessary    Comorbidities Affecting Occupational Performance: May have comorbidities impacting occupational performance    Modification or Assistance to Complete Evaluation  Min-Moderate modification of tasks or assist with assess necessary to complete eval    OT Frequency 2x / week    OT Duration 12 weeks    OT Treatment/Interventions Self-care/ADL training;DME and/or  AE instruction;Therapeutic exercise;Ultrasound;Neuromuscular education;Therapeutic activities;Energy conservation;Moist Heat;Patient/family education;Splinting;Functional Mobility Training;Paraffin    Plan Neuro re-ed and therapeutic exercise    Consulted and Agree with Plan of Care Patient             Danelle Earthly, MS, OTR/L   Otis Dials, OT 08/04/2022, 4:37 PM

## 2022-08-05 ENCOUNTER — Ambulatory Visit
Admission: RE | Admit: 2022-08-05 | Discharge: 2022-08-05 | Disposition: A | Discharge status: Home / Self Care | Payer: 59 | Attending: Urology | Admitting: Urology

## 2022-08-05 ENCOUNTER — Ambulatory Visit: Payer: 59 | Admitting: Urgent Care

## 2022-08-05 ENCOUNTER — Encounter: Payer: Self-pay | Admitting: Urology

## 2022-08-05 ENCOUNTER — Ambulatory Visit: Payer: 59

## 2022-08-05 ENCOUNTER — Other Ambulatory Visit: Payer: Self-pay

## 2022-08-05 ENCOUNTER — Encounter
Admission: RE | Disposition: A | Discharge status: Home / Self Care | Payer: Self-pay | Source: Home / Self Care | Attending: Urology

## 2022-08-05 DIAGNOSIS — Z09 Encounter for follow-up examination after completed treatment for conditions other than malignant neoplasm: Secondary | ICD-10-CM | POA: Diagnosis not present

## 2022-08-05 DIAGNOSIS — I639 Cerebral infarction, unspecified: Secondary | ICD-10-CM

## 2022-08-05 DIAGNOSIS — Z01810 Encounter for preprocedural cardiovascular examination: Secondary | ICD-10-CM

## 2022-08-05 DIAGNOSIS — Z8673 Personal history of transient ischemic attack (TIA), and cerebral infarction without residual deficits: Secondary | ICD-10-CM | POA: Diagnosis not present

## 2022-08-05 DIAGNOSIS — G894 Chronic pain syndrome: Secondary | ICD-10-CM | POA: Insufficient documentation

## 2022-08-05 DIAGNOSIS — I6930 Unspecified sequelae of cerebral infarction: Secondary | ICD-10-CM

## 2022-08-05 DIAGNOSIS — N4340 Spermatocele of epididymis, unspecified: Secondary | ICD-10-CM

## 2022-08-05 DIAGNOSIS — I10 Essential (primary) hypertension: Secondary | ICD-10-CM

## 2022-08-05 DIAGNOSIS — N4341 Spermatocele of epididymis, single: Secondary | ICD-10-CM | POA: Diagnosis not present

## 2022-08-05 DIAGNOSIS — Z01812 Encounter for preprocedural laboratory examination: Secondary | ICD-10-CM

## 2022-08-05 DIAGNOSIS — F1911 Other psychoactive substance abuse, in remission: Secondary | ICD-10-CM

## 2022-08-05 DIAGNOSIS — E7800 Pure hypercholesterolemia, unspecified: Secondary | ICD-10-CM

## 2022-08-05 DIAGNOSIS — R825 Elevated urine levels of drugs, medicaments and biological substances: Secondary | ICD-10-CM

## 2022-08-05 HISTORY — DX: Hyperlipidemia, unspecified: E78.5

## 2022-08-05 HISTORY — DX: Panic disorder (episodic paroxysmal anxiety): F41.0

## 2022-08-05 HISTORY — DX: Other sequelae of cerebral infarction: I69.398

## 2022-08-05 HISTORY — DX: Insomnia, unspecified: G47.00

## 2022-08-05 HISTORY — DX: Atherosclerosis of aorta: I70.0

## 2022-08-05 HISTORY — DX: Hydrocele, unspecified: N43.3

## 2022-08-05 HISTORY — DX: Chronic pain syndrome: G89.4

## 2022-08-05 HISTORY — DX: Unspecified hemorrhoids: K64.9

## 2022-08-05 HISTORY — DX: Anxiety disorder, unspecified: F41.9

## 2022-08-05 HISTORY — DX: Male erectile dysfunction, unspecified: N52.9

## 2022-08-05 HISTORY — DX: Unilateral inguinal hernia, without obstruction or gangrene, not specified as recurrent: K40.90

## 2022-08-05 HISTORY — DX: Spermatocele of epididymis, unspecified: N43.40

## 2022-08-05 HISTORY — PX: SPERMATOCELECTOMY: SHX2420

## 2022-08-05 HISTORY — DX: Essential (primary) hypertension: I10

## 2022-08-05 HISTORY — DX: Aphasia following cerebral infarction: I69.320

## 2022-08-05 HISTORY — DX: Spinal stenosis, lumbar region with neurogenic claudication: M48.062

## 2022-08-05 HISTORY — DX: Cramp and spasm: R25.2

## 2022-08-05 SURGERY — EXCISION, SPERMATOCELE
Anesthesia: General | Laterality: Left

## 2022-08-05 MED ORDER — FAMOTIDINE 20 MG PO TABS
ORAL_TABLET | ORAL | Status: AC
  Filled 2022-08-05: qty 1

## 2022-08-05 MED ORDER — ONDANSETRON HCL 4 MG/2ML IJ SOLN: 4.0000 mg | Freq: Once | INTRAMUSCULAR | Status: DC | PRN

## 2022-08-05 MED ORDER — LIDOCAINE HCL (CARDIAC) PF 100 MG/5ML IV SOSY
PREFILLED_SYRINGE | INTRAVENOUS | Status: DC | PRN
  Administered 2022-08-05: 100 mg via INTRAVENOUS

## 2022-08-05 MED ORDER — KETOROLAC TROMETHAMINE 30 MG/ML IJ SOLN
INTRAMUSCULAR | Status: AC
  Filled 2022-08-05: qty 1

## 2022-08-05 MED ORDER — PROPOFOL 10 MG/ML IV BOLUS
INTRAVENOUS | Status: DC | PRN
  Administered 2022-08-05: 150 mg via INTRAVENOUS

## 2022-08-05 MED ORDER — BUPIVACAINE HCL 0.5 % IJ SOLN
INTRAMUSCULAR | Status: DC | PRN
  Administered 2022-08-05: 10 mL

## 2022-08-05 MED ORDER — ONDANSETRON HCL 4 MG/2ML IJ SOLN
INTRAMUSCULAR | Status: DC | PRN
  Administered 2022-08-05: 4 mg via INTRAVENOUS

## 2022-08-05 MED ORDER — KETOROLAC TROMETHAMINE 30 MG/ML IJ SOLN
INTRAMUSCULAR | Status: DC | PRN
  Administered 2022-08-05: 30 mg via INTRAVENOUS

## 2022-08-05 MED ORDER — BUPIVACAINE HCL (PF) 0.5 % IJ SOLN
INTRAMUSCULAR | Status: AC
  Filled 2022-08-05: qty 30

## 2022-08-05 MED ORDER — FENTANYL CITRATE (PF) 100 MCG/2ML IJ SOLN
25.0000 ug | INTRAMUSCULAR | Status: DC | PRN
  Administered 2022-08-05: 25 ug via INTRAVENOUS
  Administered 2022-08-05: 50 ug via INTRAVENOUS
  Administered 2022-08-05: 25 ug via INTRAVENOUS

## 2022-08-05 MED ORDER — DEXAMETHASONE SODIUM PHOSPHATE 10 MG/ML IJ SOLN
INTRAMUSCULAR | Status: AC
  Filled 2022-08-05: qty 1

## 2022-08-05 MED ORDER — ACETAMINOPHEN 10 MG/ML IV SOLN
INTRAVENOUS | Status: AC
  Filled 2022-08-05: qty 100

## 2022-08-05 MED ORDER — DEXAMETHASONE SODIUM PHOSPHATE 10 MG/ML IJ SOLN
INTRAMUSCULAR | Status: DC | PRN
  Administered 2022-08-05: 10 mg via INTRAVENOUS

## 2022-08-05 MED ORDER — CEFAZOLIN SODIUM-DEXTROSE 2-4 GM/100ML-% IV SOLN
INTRAVENOUS | Status: AC
  Filled 2022-08-05: qty 100

## 2022-08-05 MED ORDER — GLYCOPYRROLATE 0.2 MG/ML IJ SOLN
INTRAMUSCULAR | Status: AC
  Filled 2022-08-05: qty 1

## 2022-08-05 MED ORDER — PHENYLEPHRINE HCL (PRESSORS) 10 MG/ML IV SOLN
INTRAVENOUS | Status: DC | PRN
  Administered 2022-08-05 (×2): 80 ug via INTRAVENOUS

## 2022-08-05 MED ORDER — EPHEDRINE SULFATE (PRESSORS) 50 MG/ML IJ SOLN
INTRAMUSCULAR | Status: DC | PRN
  Administered 2022-08-05: 10 mg via INTRAVENOUS
  Administered 2022-08-05 (×3): 5 mg via INTRAVENOUS

## 2022-08-05 MED ORDER — FENTANYL CITRATE (PF) 100 MCG/2ML IJ SOLN
INTRAMUSCULAR | Status: AC
  Filled 2022-08-05: qty 2

## 2022-08-05 MED ORDER — ONDANSETRON HCL 4 MG/2ML IJ SOLN
INTRAMUSCULAR | Status: AC
  Filled 2022-08-05: qty 2

## 2022-08-05 MED ORDER — HYDROCODONE-ACETAMINOPHEN 5-325 MG PO TABS: 1.0000 | ORAL_TABLET | Freq: Four times a day (QID) | ORAL | 0 refills | Status: DC | PRN

## 2022-08-05 MED ORDER — GLYCOPYRROLATE 0.2 MG/ML IJ SOLN
INTRAMUSCULAR | Status: DC | PRN
  Administered 2022-08-05: .2 mg via INTRAVENOUS

## 2022-08-05 MED ORDER — ACETAMINOPHEN 10 MG/ML IV SOLN
INTRAVENOUS | Status: DC | PRN
  Administered 2022-08-05: 1000 mg via INTRAVENOUS

## 2022-08-05 MED ORDER — FENTANYL CITRATE (PF) 100 MCG/2ML IJ SOLN
INTRAMUSCULAR | Status: DC | PRN
  Administered 2022-08-05 (×4): 25 ug via INTRAVENOUS

## 2022-08-05 MED ORDER — PHENYLEPHRINE 80 MCG/ML (10ML) SYRINGE FOR IV PUSH (FOR BLOOD PRESSURE SUPPORT)
PREFILLED_SYRINGE | INTRAVENOUS | Status: AC
  Filled 2022-08-05: qty 10

## 2022-08-05 MED ORDER — CHLORHEXIDINE GLUCONATE 0.12 % MT SOLN
OROMUCOSAL | Status: AC
  Filled 2022-08-05: qty 15

## 2022-08-05 SURGICAL SUPPLY — 35 items
ADH SKN CLS APL DERMABOND .7 (GAUZE/BANDAGES/DRESSINGS) ×1
APL PRP STRL LF DISP 70% ISPRP (MISCELLANEOUS) ×1
BLADE SURG 15 STRL LF DISP TIS (BLADE) ×1 IMPLANT
BLADE SURG 15 STRL SS (BLADE) ×1
CHLORAPREP W/TINT 26 (MISCELLANEOUS) ×1 IMPLANT
DERMABOND ADVANCED .7 DNX12 (GAUZE/BANDAGES/DRESSINGS) ×1 IMPLANT
DRAIN PENROSE 0.625X18 (DRAIN) ×1 IMPLANT
DRAPE LAPAROTOMY 77X122 PED (DRAPES) ×1 IMPLANT
DRSG GAUZE FLUFF 36X18 (GAUZE/BANDAGES/DRESSINGS) ×1 IMPLANT
ELECT CAUTERY NEEDLE TIP 1.0 (MISCELLANEOUS) ×1
ELECT REM PT RETURN 9FT ADLT (ELECTROSURGICAL) ×1
ELECTRODE CAUTERY NEDL TIP 1.0 (MISCELLANEOUS) ×1 IMPLANT
ELECTRODE REM PT RTRN 9FT ADLT (ELECTROSURGICAL) ×1 IMPLANT
GAUZE 4X4 16PLY ~~LOC~~+RFID DBL (SPONGE) ×1 IMPLANT
GAUZE SPONGE 4X4 12PLY STRL (GAUZE/BANDAGES/DRESSINGS) ×1 IMPLANT
GLOVE BIO SURGEON STRL SZ 6.5 (GLOVE) ×1 IMPLANT
GOWN STRL REUS W/ TWL LRG LVL3 (GOWN DISPOSABLE) ×2 IMPLANT
GOWN STRL REUS W/TWL LRG LVL3 (GOWN DISPOSABLE) ×2
KIT TURNOVER KIT A (KITS) ×1 IMPLANT
LABEL OR SOLS (LABEL) ×1 IMPLANT
MANIFOLD NEPTUNE II (INSTRUMENTS) ×1 IMPLANT
NDL HYPO 25X1 1.5 SAFETY (NEEDLE) ×1 IMPLANT
NEEDLE HYPO 25X1 1.5 SAFETY (NEEDLE) ×1 IMPLANT
NS IRRIG 1000ML POUR BTL (IV SOLUTION) ×1 IMPLANT
PACK BASIN MINOR ARMC (MISCELLANEOUS) ×1 IMPLANT
SUPPORETR ATHLETIC LG (MISCELLANEOUS) ×1 IMPLANT
SUPPORTER ATHLETIC LG (MISCELLANEOUS) ×1
SUT CHROMIC 3 0 SH 27 (SUTURE) ×1 IMPLANT
SUT VIC AB 3-0 54X BRD REEL (SUTURE) IMPLANT
SUT VIC AB 3-0 BRD 54 (SUTURE)
SUT VIC AB 3-0 SH 27 (SUTURE) ×1
SUT VIC AB 3-0 SH 27X BRD (SUTURE) ×1 IMPLANT
SYR 10ML LL (SYRINGE) ×1 IMPLANT
TRAP FLUID SMOKE EVACUATOR (MISCELLANEOUS) ×1 IMPLANT
WATER STERILE IRR 500ML POUR (IV SOLUTION) ×1 IMPLANT

## 2022-08-05 NOTE — Anesthesia Preprocedure Evaluation (Signed)
Anesthesia Evaluation  Patient identified by MRN, date of birth, ID band Patient awake    Reviewed: Allergy & Precautions, H&P , NPO status , Patient's Chart, lab work & pertinent test results, reviewed documented beta blocker date and time   History of Anesthesia Complications Negative for: history of anesthetic complications  Airway Mallampati: III  TM Distance: >3 FB Neck ROM: full    Dental  (+) Dental Advidsory Given, Caps, Teeth Intact   Pulmonary neg shortness of breath, asthma , neg sleep apnea, neg COPD, neg recent URI   Pulmonary exam normal breath sounds clear to auscultation       Cardiovascular Exercise Tolerance: Good hypertension, (-) angina (-) Past MI and (-) Cardiac Stents Normal cardiovascular exam(-) dysrhythmias (-) Valvular Problems/Murmurs Rhythm:regular Rate:Normal     Neuro/Psych neg Seizures PSYCHIATRIC DISORDERS Anxiety     CVA (right side weakness), Residual Symptoms    GI/Hepatic Neg liver ROS,GERD  ,,  Endo/Other  negative endocrine ROS    Renal/GU negative Renal ROS  negative genitourinary   Musculoskeletal   Abdominal   Peds  Hematology negative hematology ROS (+)   Anesthesia Other Findings Past Medical History: No date: Anxiety No date: Aortic atherosclerosis No date: Aphasia as late effect of cerebrovascular accident (CVA) 04/22/2012: Back pain No date: Bone spur 04/22/2012: Bulging disc No date: Chronic pain syndrome     Comment:  a.) on buprenorphrine No date: Cognitive dysfunction No date: Degenerative disc disease No date: Erectile dysfunction No date: GERD (gastroesophageal reflux disease) No date: Hemorrhoids No date: History of substance use     Comment:  a.) THC + cocaine + ETOH No date: HLD (hyperlipidemia) No date: HTN (hypertension) No date: Hydrocele, bilateral No date: Insomnia 11/08/2020: Ischemic cerebrovascular accident (CVA)     Comment:  a.) MRI brain  11/08/2020 --> extensive hypodensities and              loss of gray-white differentiation in the left ACA and               left MCA territories with slight hyperdense left MCA and               likely hyperdense left distal ICA; etiology felt to be               secondary to cocaine use No date: Lumbar stenosis with neurogenic claudication No date: Osteoarthritis No date: Panic anxiety syndrome No date: Panic disorder No date: Right inguinal hernia No date: 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     Comment:  a.) on BZO (diazepam) PRN No date: Spermatocele (LEFT) No date: Taking multiple medications for chronic disease   Reproductive/Obstetrics negative OB ROS                             Anesthesia Physical Anesthesia Plan  ASA: 3  Anesthesia Plan: General   Post-op Pain Management:    Induction: Intravenous  PONV Risk Score and Plan: 2 and Ondansetron, Dexamethasone, Midazolam and Treatment may vary due to age or medical condition  Airway Management Planned: LMA  Additional Equipment:   Intra-op Plan:   Post-operative Plan: Extubation in OR  Informed Consent: I have reviewed the patients History and Physical, chart, labs and discussed the procedure including the risks, benefits and alternatives for the proposed anesthesia with the patient or authorized representative who has indicated his/her understanding and acceptance.     Dental Advisory Given  Plan Discussed with: Anesthesiologist, CRNA and Surgeon  Anesthesia Plan Comments:        Anesthesia Quick Evaluation

## 2022-08-05 NOTE — Discharge Instructions (Addendum)
You may resume your baby aspirin in 3 days postop as long as you do not have any significant swelling or bleeding.   AMBULATORY SURGERY  DISCHARGE INSTRUCTIONS   The drugs that you were given will stay in your system until tomorrow so for the next 24 hours you should not:  Drive an automobile Make any legal decisions Drink any alcoholic beverage   You may resume regular meals tomorrow.  Today it is better to start with liquids and gradually work up to solid foods.  You may eat anything you prefer, but it is better to start with liquids, then soup and crackers, and gradually work up to solid foods.   Please notify your doctor immediately if you have any unusual bleeding, trouble breathing, redness and pain at the surgery site, drainage, fever, or pain not relieved by medication.    Additional Instructions:        Please contact your physician with any problems or Same Day Surgery at [phone], Monday through Friday 6 am to 4 pm, or [place] at [facility] number at [phone].

## 2022-08-05 NOTE — Anesthesia Procedure Notes (Signed)
Procedure Name: LMA Insertion Date/Time: 08/05/2022 11:17 AM  Performed by: Omer Jack, CRNAPre-anesthesia Checklist: Patient identified, Patient being monitored, Timeout performed, Emergency Drugs available and Suction available Patient Re-evaluated:Patient Re-evaluated prior to induction Oxygen Delivery Method: Circle system utilized Preoxygenation: Pre-oxygenation with 100% oxygen Induction Type: IV induction Ventilation: Mask ventilation without difficulty LMA: LMA inserted LMA Size: 5.0 Tube type: Oral Number of attempts: 1 Placement Confirmation: positive ETCO2 and breath sounds checked- equal and bilateral Tube secured with: Tape Dental Injury: Teeth and Oropharynx as per pre-operative assessment

## 2022-08-05 NOTE — Interval H&P Note (Signed)
History and Physical Interval Note:  08/05/2022 11:03 AM  David Reeves  has presented today for surgery, with the diagnosis of Left Spermatocele.  The various methods of treatment have been discussed with the patient and family. After consideration of risks, benefits and other options for treatment, the patient has consented to  Procedure(s): SPERMATOCELECTOMY (Left) as a surgical intervention.  The patient's history has been reviewed, patient examined, no change in status, stable for surgery.  I have reviewed the patient's chart and labs.  Questions were answered to the patient's satisfaction.    RRR CTAB   Vanna Scotland

## 2022-08-05 NOTE — Op Note (Signed)
Date of procedure: 08/05/22  Preoperative diagnosis:  Left spermatocele  Postoperative diagnosis:  As above  Procedure: Left spermatocele ectomy  Surgeon: Vanna Scotland, MD  Anesthesia: General  Complications: None  Intraoperative findings: 8 cm left simple spermatocele.  Carefully dissected.  Excellent hemostasis.  EBL: Minimal  Specimens: None  Drains: None  Indication: David Reeves is a 52 y.o. patient with large 8 cm symptomatic hydrocele.  After reviewing the management options for treatment, he elected to proceed with the above surgical procedure(s). We have discussed the potential benefits and risks of the procedure, side effects of the proposed treatment, the likelihood of the patient achieving the goals of the procedure, and any potential problems that might occur during the procedure or recuperation. Informed consent has been obtained.  Description of procedure:  The patient was taken to the operating room and general anesthesia was induced.  The patient was placed in the supine position, prepped and draped in the usual sterile fashion, and preoperative antibiotics were administered. A preoperative time-out was performed.   Approximately 4 cm long incision was made transversely across the left hemiscrotum.  This is carried deeper through dartos using Bovie electrocautery and tunica vaginalis was also opened.  Several additional layers were then dissected off both bluntly and sharply with cautery and oriented to reveal the left spermatocele, adjacent testicle and epididymis.  The spermatocele was delicate, simple, and otherwise unremarkable.  I then began to carefully dissect the spermatocele sac away from the structures including the cord, dartos muscle, epididymis and testicle.  Careful and tedious hemostasis was performed throughout the entire procedure.  I did tie off a few ducts and/or vessels adjacent to the epididymis on the stay side for additional hemostasis.   I was able to free the entire spermatocele at this point from the adjacent structures and passed off the field.  Because the structure looked completely pathologically normal, I did not elect to send it for pathologic review.  Attention was then turned back to the remaining structures where additional hemostasis was facilitated.  I irrigated the wound several times.  And then return the left testicle back into the left hemiscrotum in the appropriate anatomic configuration.  Dartos was then closed using a running 3-0 Vicryl suture.  Half percent Marcaine was instilled into the wound as a local anesthetic.  The skin was closed using interrupted chromic suture.  Dermabond, scrotal fluffs and a scrotal support device were applied.  Patient was then cleaned and dried, reversed of anesthesia and taken the PACU in stable condition.  There were no complications in this case.  Plan: Return in 4 weeks for wound check.  Vanna Scotland, M.D.

## 2022-08-05 NOTE — Transfer of Care (Signed)
Immediate Anesthesia Transfer of Care Note  Patient: David Reeves  Procedure(s) Performed: SPERMATOCELECTOMY (Left)  Patient Location: PACU  Anesthesia Type:General  Level of Consciousness: drowsy  Airway & Oxygen Therapy: Patient Spontanous Breathing and Patient connected to face mask oxygen  Post-op Assessment: Report given to RN and Post -op Vital signs reviewed and stable  Post vital signs: Reviewed and stable  Last Vitals:  Vitals Value Taken Time  BP 116/75 08/05/22 1230  Temp 36.3 C 08/05/22 1230  Pulse 53 08/05/22 1235  Resp 13 08/05/22 1235  SpO2 100 % 08/05/22 1235  Vitals shown include unvalidated device data.  Last Pain:  Vitals:   08/05/22 1230  TempSrc:   PainSc: Asleep         Complications: No notable events documented.

## 2022-08-06 ENCOUNTER — Encounter: Payer: Self-pay | Admitting: Urology

## 2022-08-06 ENCOUNTER — Ambulatory Visit: Payer: 59

## 2022-08-06 ENCOUNTER — Ambulatory Visit: Payer: Medicare Other

## 2022-08-07 NOTE — Anesthesia Postprocedure Evaluation (Signed)
Anesthesia Post Note  Patient: David Reeves  Procedure(s) Performed: SPERMATOCELECTOMY (Left)  Patient location during evaluation: PACU Anesthesia Type: General Level of consciousness: awake and alert Pain management: pain level controlled Vital Signs Assessment: post-procedure vital signs reviewed and stable Respiratory status: spontaneous breathing, nonlabored ventilation, respiratory function stable and patient connected to nasal cannula oxygen Cardiovascular status: blood pressure returned to baseline and stable Postop Assessment: no apparent nausea or vomiting Anesthetic complications: no   No notable events documented.   Last Vitals:  Vitals:   08/05/22 1315 08/05/22 1327  BP: 120/77 126/75  Pulse: 89 73  Resp: 20 18  Temp: 36.5 C (!) 36.1 C  SpO2: 99% 96%    Last Pain:  Vitals:   08/05/22 1327  TempSrc: Temporal  PainSc: 0-No pain                 Lenard Simmer

## 2022-08-08 ENCOUNTER — Ambulatory Visit: Payer: Medicare Other

## 2022-08-08 ENCOUNTER — Ambulatory Visit: Payer: 59

## 2022-08-08 DIAGNOSIS — Z8673 Personal history of transient ischemic attack (TIA), and cerebral infarction without residual deficits: Secondary | ICD-10-CM

## 2022-08-08 DIAGNOSIS — R278 Other lack of coordination: Secondary | ICD-10-CM

## 2022-08-08 DIAGNOSIS — M6281 Muscle weakness (generalized): Secondary | ICD-10-CM

## 2022-08-08 NOTE — Therapy (Signed)
OUTPATIENT OCCUPATIONAL THERAPY NEURO TREATMENT NOTE  Patient Name: David Reeves MRN: [id number] DOB:[birth year], 52 y.o., male Today's Date: 08/08/2022  PCP: Alfredia Ferguson, PA-C REFERRING PROVIDER: Alfredia Ferguson, PA-C   OT End of Session - 08/08/22 1245     Visit Number 8    Number of Visits 24    Date for OT Re-Evaluation 10/04/22    Authorization Type Reporting period beginning 07/12/22    Progress Note Due on Visit 10    OT Start Time 1100    OT Stop Time 1145    OT Time Calculation (min) 45 min    Activity Tolerance Patient tolerated treatment well    Behavior During Therapy [facility] for tasks assessed/performed             Past Medical History:  Diagnosis Date   Anxiety    Aortic atherosclerosis    Aphasia as late effect of cerebrovascular accident (CVA)    Back pain 04/22/2012   Bone spur    Bulging disc 04/22/2012   Chronic pain syndrome    a.) on buprenorphrine   Cognitive dysfunction    Degenerative disc disease    Erectile dysfunction    GERD (gastroesophageal reflux disease)    Hemorrhoids    History of substance use    a.) THC + cocaine + ETOH   HLD (hyperlipidemia)    HTN (hypertension)    Hydrocele, bilateral    Insomnia    Ischemic cerebrovascular accident (CVA) 11/08/2020   a.) MRI brain 11/08/2020 --> extensive hypodensities and loss of gray-white differentiation in the left ACA and left MCA territories with slight hyperdense left MCA and likely hyperdense left distal ICA; etiology felt to be secondary to cocaine use   Lumbar stenosis with neurogenic claudication    Osteoarthritis    Panic anxiety syndrome    Panic disorder    Right inguinal hernia    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    a.) on BZO (diazepam) PRN   Spermatocele (LEFT)    Taking multiple medications for chronic disease    Past Surgical History:  Procedure Laterality Date   CYSTECTOMY     head   HERNIA REPAIR Bilateral 2006,2014   [facility]   SPERMATOCELECTOMY Left 08/05/2022    Procedure: SPERMATOCELECTOMY;  Surgeon: Vanna Scotland, MD;  Location: [facility] ORS;  Service: Urology;  Laterality: Left;   TOE SURGERY Right 2007   Patient Active Problem List   Diagnosis Date Noted   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 06/12/2022   Alterations of sensations following cerebrovascular accident 03/08/2021   Aphasia as late effect of cerebrovascular accident (CVA) 03/08/2021   Chronic pain syndrome 01/23/2021   Primary hypertension 01/23/2021   Erectile dysfunction due to diseases classified elsewhere 01/23/2021   Cognitive dysfunction 01/23/2021   Elevated LDL cholesterol level 01/23/2021   History of stroke with residual deficit 01/23/2021   Primary insomnia 01/23/2021   Acute ischemic stroke 11/10/2020   Closed compression fracture of L1 vertebra 05/24/2016   Lumbar stenosis with neurogenic claudication 11/01/2015   Chronic bilateral low back pain with right-sided sciatica 11/08/2014   Hx of hemorrhoids 11/08/2014   Chronic pain associated with significant psychosocial dysfunction 09/22/2014   Panic disorder 09/22/2014   AB (asthmatic bronchitis) 08/17/2014   Anxiety disorder due to general medical condition 08/17/2014   Lumbosacral spondylosis without myelopathy 08/17/2014   Brash 08/17/2014   Episodic paroxysmal anxiety disorder 08/17/2014   Hernia, inguinal, right 08/17/2014   Inguinal hernia 10/14/2012   ONSET DATE:  11/09/2020  REFERRING DIAG: CVA  THERAPY DIAG:  Muscle weakness (generalized)  Other lack of coordination  H/O ischemic left MCA stroke  Rationale for Evaluation and Treatment Rehabilitation  PERTINENT HISTORY: Pt returns today for re-eval following first round of Botox for RUE spasticity; injections done on 07/11/22.  Pt had reached plateau with OT goals by mid Dec of 2023 as spasticity was limiting progression of coordination/dexterity skills in the R hand.  Pt planning to trial Botox with a second treatment planned 3 months out.  Pt is also in  the process of being evaluated for Vivistim, but has not yet had the Fugl-Meyer Assessment in [place].  Awaiting insurance authorization for this.  Pt. is a 52 y.o. who was diagnosed with a CVA on July 21st, 2022. Pt. completed several weeks of  inpatient rehab at [facility]. After returning to home, Pt. sustained a fall in december of 2022, and was admitted to the hospital with COVID-19, and back pain from the fall. Since the most recent discharge, Pt. has been residing with the ex-wife until the Pt. is ready to return to independent living. Pt. PMHx includes: Bilateral LBP with right sided sciatica, Lumbosacral spondylosis myelopathy, closed compression Fx of L1. Pt. enjoys cooking, and riding motorcycles.   PRECAUTIONS: Fall  SUBJECTIVE: Pt reports minimal pain following his procedure on Monday which was performed to remove a testicular cyst.  Pt states he only had to use his pain meds 2x.  Pt relieved to have procedure behind him but verbalizes being nervous for Vivistim eval next week.  Pt provided with encouragement today to help calm his anxiety.    PAIN:  Are you having pain? Yes: NPRS scale: 2/10 Pain location: low back Pain description: aching Aggravating factors: prolonged sitting or lying down Relieving factors: rest, heat, walking  [facility] OT Assessment        12/11/21 01/22/22 03/01/22 04/09/22 07/12/22               Observation/Other Assessments        Focus on Therapeutic Outcomes (FOTO)   46 50 49 49 56               Coordination        Right 9 Hole Peg Test unable  unable unable Unable (was able to remove 1 peg from hole but unable to pick up peg from dish) unable    Left 9 Hole Peg Test 30 sec  30 sec NT NT (WNL) NT (WNL)               Hand Function        Right Hand Grip (lbs) 35 (3rd setting) 30 lb (3rd);  41 lb (4th setting),  44 lbs (4th setting) 46 lbs (4th setting) 30 lbs (4th setting)    Right Hand Lateral Pinch 9 lbs   slipping 12 lbs (thumb slips) 16 lbs (multiple trials  d/t thumb slips) 4 lbs (multiple trials d/t thumb slips) 14 lbs (Saehan)    Right Hand 3 Point Pinch   unable to position thumb to produce 3 pt pinch Unable to position thumb to produce 3 point pinch unable Unable (fingers slip) unable    Left Hand Grip (lbs) 116     95    Left Hand Lateral Pinch 29 lbs     31    Left 3 point pinch 27 lbs    32     OBJECTIVE:  TODAY'S TREATMENT:  Neuro re-ed: Continued focus on  R hand grasp patterns. Thumb abd: 1st CMC, MCP, IP at 0*: scrap of paper between thumb and 2nd MCP joint.  Pt required OT to stabilize digits 2-5 in extension on table top and thumb IP at 0.  Pt not yet able to sustain this grasp to hold the paper. Pincer grasp/opposition: thumb pad against 2nd finger pad, pt practiced grasping a pencil between these digits from therapist's hand.  Pt inconsistent to maintain this grasp pattern, often having to use his opposite hand to keep thumb and IF slipping from pen.  Once pen is placed correctly with hand over hand assist, pt can sustain grasp but not yet against resistance.  Cylindrical grasp: Pt practiced grasping a small can from table top with forearm in neutral pron/sup.  Pt fairly consistent to grasp can and can hold can against a tug, with occasional slipping of R thumb.  Spherical grasp: Pt practiced grasping tennis ball from table top.  Fairly consistent to grasp ball and hold ball against a tug, with occasional slipping of R thumb causing the ball to drop from R hand.  Practiced high reps of grasping above noted items with tactile cues and proprioceptive input through R hand digits to reinforce these prehension patterns with and without grasping against resistance.  PATIENT EDUCATION: Education details: L hand assisting R hand with grasp patterns at home Person educated: Patient Education method: Explanation, demo Education comprehension: verbalized and demonstrated understanding.      OT Short Term Goals - 6 weeks (08/23/22)      OT SHORT  TERM GOAL #1   Title Pt. wil demonstrtae independence with HEPs    Baseline Eval: Pt. currently does not have one, 10th visit:  changes in HEP ongoing; 20th: instructed in putty exercises, changes ongoing; 40th: continual updates to HEP as pt progresses; 50th: continual updates; 01/22/22: continual updates; 03/01/22: continual updates; 04/09/22: pt demo indep with current hand strengthening and coordination exercises; will adjust as needed if pt is able to schedule Botox injections  Re-eval 07/12/22: will progress with any changes noted with Botox, currently working on active assisted ROM in all digits and digit opposition   Time 6    Period Weeks    Status On-going    Target Date 08/23/22            OT Long Term Goals - 12 weeks (10/04/22)      OT LONG TERM GOAL #1   Title Pt. will improve RUE strength by 2 mm grades to assist with ADLs, and IADLs Revised 07/12/22: Pt will improve R forearm and wrist strength by 1 MM grade in order to turn a door knob with the R hand.   Baseline Eval: Right shoulder flexion, abduction 3/5, elbow flexion, extension wrist extension 3+/5, 10th visit: improving with RUE strength by not yet met goal; 20th visit: R shd flex/abd 4-, elbow flex/ext 4+, wrist flex 4-, wrist ext 4+; 40:  R shd flex/abd 4,  elbow flex/ext 4+, wrist flex 4, wrist ext 4+; 50th visit 8/22: R shoulder flex/abd 4/5, elbow flex/ext 5/5, wrist flex 4, wrist ext 4+; 01/22/22: shoulder flex/abd 4/5, elbow flex/ext 5/5, wrist flex 4, wrist ext 4+; 03/01/22: same as 01/22/22  Re-eval 07/12/22: R shoulder flex/abd 4/5, elbow flex/ext 5/5, forearm sup 3-, wrist ext 3+, wrist flex NT (limited by tone)   Time 12    Period Weeks    Status On-going    Target Date 10/04/22     OT LONG TERM GOAL #2  Title Pt. will improve right grip by 10 lbs to prepare for firmly holding objects for IADLs.    Baseline Eval: R: 38#, L: 124# (In 3rd dynamometer slot); R 35# in 4th slot, 28# in 3rd slot, 40th: 47 in 4th  slot; 50th: 12/11/21: 46# on 3rd slot; 01/22/22: 30# on 3rd slot; 03/01/22: 44# 4th slot; 04/09/22: 46#  Re-eval 07/12/22: R grip 30#   Time 12    Period Weeks    Status On-going    Target Date 6/14//24     OT LONG TERM GOAL #3   Title Pt. will improve right lateral pinch strength by 5# to assist with cutting food  Revised 07/12/22: increase by 3# or more    Baseline Eval: R: 17, L: 25; 20th: R: 18; 40th: 15# fingers slipping on meter, 50th: 9# fingers slipping; 01/22/22: 12# fingers slipping; 03/01/22: 16#; 04/09/22: 5# thumb slipping  Re-eval 07/12/22: R 14# (Saehan gauge)   Time 12    Period Weeks    Status On-going    Target Date 10/04/22     OT LONG TERM GOAL #4   Title Pt. will improve Right 3pt pinch by 2# to be able to hold/open items for cooking    Baseline Eval: R: Pt. unable to engage thumb L: 29#; 20th: R unable; 40th:  unable, fingers slipping; 50th:  unable, fingers slipping; 01/22/22: fingers slipping but pt is now able to grasp a ball peg with 3 point pinch pattern; 03/01/22: same as 10/3 in addition to being able to pick up ball peg from pegboard then place peg back into pegboard with a 3 point pinch pattern; 04/10/23: pt can pick up a ball peg from pegboard and place back into peg board using a 3 point pinch pattern, but when challenged to apply any force with this pinch, fingers slip from position. Unable to register a 3 point pinch on pinch gauge.  Re-eval 07/12/22: unable   Time 12    Period Weeks    Status On-going    Target Date 6/14//24     OT LONG TERM GOAL #5   Title Pt. will improve right hand [facility] skills to be able to independently manipulate buttons, and zippers.  Revised 07/12/22: Pt will use R hand as a good stabilizer to manage clothing fasteners with increased time.   Baseline Eval: Pt. has difficulty managing buttons,a nd zippers. 10th visit:  improving; 20th: unable to engage R hand effectively, performs 1 handed with L; 50th: performs L handed only; 01/22/22:  pt can use R hand as a stabilizer with clothing fasteners with mod vc; 03/01/22: same as 01/22/22; 04/09/22: Pt uses R hand as a stabilizer with mod vc  Re-eval 07/12/22: pt manages clothing fasteners with L hand only   Time 12    Period Weeks    Status On-going    Target Date 10/04/22     OT LONG TERM GOAL #6   Title Pt. will independently write his name    Baseline Eval: Pt. is unable to hold a pen; 20th: pt can grip PenAgain with R hand and can make marks on paper with difficulty, but not yet writing name; 40th:  continues to demonstrate difficulty with grasp of pen; 50th 12/11/21: With assist to set up hand on pen again, pt was able to write first name and initial of last name in 1 1/4" tall letters with fair legibility (pt wants to defer this goal, states this is low on his list of priorities for  now)   Time 12    Period Weeks    Status D/c (no longer a priority for pt)   Target Date 04/16/22      OT LONG TERM GOAL #7   Title Pt. will improve FOTO score by 2 points to reflect funational improvement    Baseline Eval: FOTO score 46 with TR score 52; 20th: 45; 40th: 44;  50th: 46; 01/22/22: 50; 03/01/22: 49; 04/09/22: 49  Re-eval 07/12/22: 56; predicted 58   Time 12    Period Weeks    Status On-going    Target Date 10/04/22            Plan -    Clinical Impression Statement Pt reports minimal pain following his procedure on Monday which was performed to remove a testicular cyst.  Pt states he only had to use his pain meds 2x.  Pt relieved to have procedure behind him but verbalizes being nervous for Vivistim eval next week.  Pt provided with encouragement today to help calm his anxiety.  Focused on R hand grasp patterns this date, including thumb abd to IF, pincer, spherical, and cylindrical grasp.  Instructed pt in using L hand to assist R hand to formulate these grasp patterns using household items for HEP.  Pt benefits from high reps of grasping with paper, pen, can, and tennis ball  with therapist providing tactile cues and proprioceptive input through R hand digits to reinforce these prehension patterns with and without grasping against resistance.  Pt only able maintain grasp of a can and ball against resistance on most attempts; not yet able to withstand a tug holding a pen or paper.  Pt continues to report benefits of recent Botox injections and can tell his RUE is more relaxed with less spasticity.  Pt will continue to benefit from skilled OT to maximize strength and dexterity skills in the RUE in combination with Botox injections in order to maximize functional use of the RUE for daily tasks and prepare for Vivistim evaluation.      OT Occupational Profile and History Detailed Assessment- Review of Records and additional review of physical, cognitive, psychosocial history related to current functional performance    Occupational performance deficits (Please refer to evaluation for details): ADL's;IADL's;Education    Rehab Potential Good    Clinical Decision Making Several treatment options, min-mod task modification necessary    Comorbidities Affecting Occupational Performance: May have comorbidities impacting occupational performance    Modification or Assistance to Complete Evaluation  Min-Moderate modification of tasks or assist with assess necessary to complete eval    OT Frequency 2x / week    OT Duration 12 weeks    OT Treatment/Interventions Self-care/ADL training;DME and/or AE instruction;Therapeutic exercise;Ultrasound;Neuromuscular education;Therapeutic activities;Energy conservation;Moist Heat;Patient/family education;Splinting;Functional Mobility Training;Paraffin    Plan Neuro re-ed and therapeutic exercise    Consulted and Agree with Plan of Care Patient             Danelle Earthly, MS, OTR/L   Otis Dials, OT 08/08/2022, 12:46 PM

## 2022-08-09 ENCOUNTER — Ambulatory Visit: Payer: 59

## 2022-08-09 DIAGNOSIS — R278 Other lack of coordination: Secondary | ICD-10-CM

## 2022-08-09 DIAGNOSIS — Z8673 Personal history of transient ischemic attack (TIA), and cerebral infarction without residual deficits: Secondary | ICD-10-CM | POA: Diagnosis not present

## 2022-08-09 DIAGNOSIS — M6281 Muscle weakness (generalized): Secondary | ICD-10-CM

## 2022-08-09 NOTE — Therapy (Signed)
OUTPATIENT OCCUPATIONAL THERAPY NEURO TREATMENT NOTE  Patient Name: David Reeves MRN: [id number] DOB:[birth year], 52 y.o., male Today's Date: 08/09/2022  PCP: Alfredia Ferguson, PA-C REFERRING PROVIDER: Alfredia Ferguson, PA-C   OT End of Session - 08/09/22 0928     Visit Number 9    Number of Visits 24    Date for OT Re-Evaluation 10/04/22    Authorization Type Reporting period beginning 07/12/22    Progress Note Due on Visit 10    OT Start Time 0930    OT Stop Time 1015    OT Time Calculation (min) 45 min    Activity Tolerance Patient tolerated treatment well    Behavior During Therapy [facility] for tasks assessed/performed             Past Medical History:  Diagnosis Date   Anxiety    Aortic atherosclerosis    Aphasia as late effect of cerebrovascular accident (CVA)    Back pain 04/22/2012   Bone spur    Bulging disc 04/22/2012   Chronic pain syndrome    a.) on buprenorphrine   Cognitive dysfunction    Degenerative disc disease    Erectile dysfunction    GERD (gastroesophageal reflux disease)    Hemorrhoids    History of substance use    a.) THC + cocaine + ETOH   HLD (hyperlipidemia)    HTN (hypertension)    Hydrocele, bilateral    Insomnia    Ischemic cerebrovascular accident (CVA) 11/08/2020   a.) MRI brain 11/08/2020 --> extensive hypodensities and loss of gray-white differentiation in the left ACA and left MCA territories with slight hyperdense left MCA and likely hyperdense left distal ICA; etiology felt to be secondary to cocaine use   Lumbar stenosis with neurogenic claudication    Osteoarthritis    Panic anxiety syndrome    Panic disorder    Right inguinal hernia    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    a.) on BZO (diazepam) PRN   Spermatocele (LEFT)    Taking multiple medications for chronic disease    Past Surgical History:  Procedure Laterality Date   CYSTECTOMY     head   HERNIA REPAIR Bilateral 2006,2014   [facility]   SPERMATOCELECTOMY Left 08/05/2022    Procedure: SPERMATOCELECTOMY;  Surgeon: Vanna Scotland, MD;  Location: [facility] ORS;  Service: Urology;  Laterality: Left;   TOE SURGERY Right 2007   Patient Active Problem List   Diagnosis Date Noted   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 06/12/2022   Alterations of sensations following cerebrovascular accident 03/08/2021   Aphasia as late effect of cerebrovascular accident (CVA) 03/08/2021   Chronic pain syndrome 01/23/2021   Primary hypertension 01/23/2021   Erectile dysfunction due to diseases classified elsewhere 01/23/2021   Cognitive dysfunction 01/23/2021   Elevated LDL cholesterol level 01/23/2021   History of stroke with residual deficit 01/23/2021   Primary insomnia 01/23/2021   Acute ischemic stroke 11/10/2020   Closed compression fracture of L1 vertebra 05/24/2016   Lumbar stenosis with neurogenic claudication 11/01/2015   Chronic bilateral low back pain with right-sided sciatica 11/08/2014   Hx of hemorrhoids 11/08/2014   Chronic pain associated with significant psychosocial dysfunction 09/22/2014   Panic disorder 09/22/2014   AB (asthmatic bronchitis) 08/17/2014   Anxiety disorder due to general medical condition 08/17/2014   Lumbosacral spondylosis without myelopathy 08/17/2014   Brash 08/17/2014   Episodic paroxysmal anxiety disorder 08/17/2014   Hernia, inguinal, right 08/17/2014   Inguinal hernia 10/14/2012   ONSET DATE:  11/09/2020  REFERRING DIAG: CVA  THERAPY DIAG:  Muscle weakness (generalized)  Other lack of coordination  H/O ischemic left MCA stroke  Rationale for Evaluation and Treatment Rehabilitation  PERTINENT HISTORY: Pt returns today for re-eval following first round of Botox for RUE spasticity; injections done on 07/11/22.  Pt had reached plateau with OT goals by mid Dec of 2023 as spasticity was limiting progression of coordination/dexterity skills in the R hand.  Pt planning to trial Botox with a second treatment planned 3 months out.  Pt is also in  the process of being evaluated for Vivistim, but has not yet had the Fugl-Meyer Assessment in [place].  Awaiting insurance authorization for this.  Pt. is a 52 y.o. who was diagnosed with a CVA on July 21st, 2022. Pt. completed several weeks of  inpatient rehab at [facility]. After returning to home, Pt. sustained a fall in december of 2022, and was admitted to the hospital with COVID-19, and back pain from the fall. Since the most recent discharge, Pt. has been residing with the ex-wife until the Pt. is ready to return to independent living. Pt. PMHx includes: Bilateral LBP with right sided sciatica, Lumbosacral spondylosis myelopathy, closed compression Fx of L1. Pt. enjoys cooking, and riding motorcycles.   PRECAUTIONS: Fall  SUBJECTIVE: Pt reports improved pain following his procedure on Monday.  PAIN:  Are you having pain? Yes: NPRS scale: 1/10 Pain location: low back Pain description: aching Aggravating factors: prolonged sitting or lying down Relieving factors: rest, heat, walking  [facility] OT Assessment        12/11/21 01/22/22 03/01/22 04/09/22 07/12/22               Observation/Other Assessments        Focus on Therapeutic Outcomes (FOTO)   46 50 49 49 56               Coordination        Right 9 Hole Peg Test unable  unable unable Unable (was able to remove 1 peg from hole but unable to pick up peg from dish) unable    Left 9 Hole Peg Test 30 sec  30 sec NT NT (WNL) NT (WNL)               Hand Function        Right Hand Grip (lbs) 35 (3rd setting) 30 lb (3rd);  41 lb (4th setting),  44 lbs (4th setting) 46 lbs (4th setting) 30 lbs (4th setting)    Right Hand Lateral Pinch 9 lbs   slipping 12 lbs (thumb slips) 16 lbs (multiple trials d/t thumb slips) 4 lbs (multiple trials d/t thumb slips) 14 lbs (Saehan)    Right Hand 3 Point Pinch   unable to position thumb to produce 3 pt pinch Unable to position thumb to produce 3 point pinch unable Unable (fingers slip) unable    Left Hand Grip (lbs)  116     95    Left Hand Lateral Pinch 29 lbs     31    Left 3 point pinch 27 lbs    32     OBJECTIVE:  TODAY'S TREATMENT:  Neuro re-ed: Continued focus on R hand grasp patterns. Thumb abd: 1st CMC, MCP, IP at 0*: scrap of paper between thumb and 2nd MCP joint.  Pt required OT to stabilize digits 2-5 in extension on table top and thumb IP at 0.  Pt sustains grasp to hold the paper with assistance to  maintain 2nd digit in extension. Educated on plan to trial at home. Pincer grasp/opposition: thumb pad against 2nd finger pad, pt practiced grasping a pencil between these digits from therapist's hand.  Pt inconsistent to maintain this grasp pattern. Cylindrical grasp: Pt practiced grasping a small can from table top with forearm in neutral pron/sup. Requires can to be stabilized to consistently grasp. Spherical grasp: Pt practiced grasping tennis ball from table top. Fairly consistent to grasp ball and hold ball against a tug, with occasional slipping of R thumb causing the ball to drop from R hand.  Practiced high reps of grasping above noted items with tactile cues and proprioceptive input through R hand digits to reinforce these prehension patterns with and without grasping against resistance. Isolated thumb DIP extension/flexion with L hand assist to maintain 2-5 digits in extension. Trialed R hand coordination completing 5x knee to nose pattern with eyes closed, completes in 12 seconds.   PATIENT EDUCATION: Education details: L hand assisting R hand with grasp patterns at home Person educated: Patient Education method: Explanation, demo Education comprehension: verbalized and demonstrated understanding.      OT Short Term Goals - 6 weeks (08/23/22)      OT SHORT TERM GOAL #1   Title Pt. wil demonstrtae independence with HEPs    Baseline Eval: Pt. currently does not have one, 10th visit:  changes in HEP ongoing; 20th: instructed in putty exercises, changes ongoing; 40th: continual updates to HEP  as pt progresses; 50th: continual updates; 01/22/22: continual updates; 03/01/22: continual updates; 04/09/22: pt demo indep with current hand strengthening and coordination exercises; will adjust as needed if pt is able to schedule Botox injections  Re-eval 07/12/22: will progress with any changes noted with Botox, currently working on active assisted ROM in all digits and digit opposition   Time 6    Period Weeks    Status On-going    Target Date 08/23/22            OT Long Term Goals - 12 weeks (10/04/22)      OT LONG TERM GOAL #1   Title Pt. will improve RUE strength by 2 mm grades to assist with ADLs, and IADLs Revised 07/12/22: Pt will improve R forearm and wrist strength by 1 MM grade in order to turn a door knob with the R hand.   Baseline Eval: Right shoulder flexion, abduction 3/5, elbow flexion, extension wrist extension 3+/5, 10th visit: improving with RUE strength by not yet met goal; 20th visit: R shd flex/abd 4-, elbow flex/ext 4+, wrist flex 4-, wrist ext 4+; 40:  R shd flex/abd 4,  elbow flex/ext 4+, wrist flex 4, wrist ext 4+; 50th visit 8/22: R shoulder flex/abd 4/5, elbow flex/ext 5/5, wrist flex 4, wrist ext 4+; 01/22/22: shoulder flex/abd 4/5, elbow flex/ext 5/5, wrist flex 4, wrist ext 4+; 03/01/22: same as 01/22/22  Re-eval 07/12/22: R shoulder flex/abd 4/5, elbow flex/ext 5/5, forearm sup 3-, wrist ext 3+, wrist flex NT (limited by tone)   Time 12    Period Weeks    Status On-going    Target Date 10/04/22     OT LONG TERM GOAL #2   Title Pt. will improve right grip by 10 lbs to prepare for firmly holding objects for IADLs.    Baseline Eval: R: 38#, L: 124# (In 3rd dynamometer slot); R 35# in 4th slot, 28# in 3rd slot, 40th: 47 in 4th slot; 50th: 12/11/21: 46# on 3rd slot; 01/22/22: 30# on 3rd slot; 03/01/22:  44# 4th slot; 04/09/22: 46#  Re-eval 07/12/22: R grip 30#   Time 12    Period Weeks    Status On-going    Target Date 6/14//24     OT LONG TERM GOAL #3   Title  Pt. will improve right lateral pinch strength by 5# to assist with cutting food  Revised 07/12/22: increase by 3# or more    Baseline Eval: R: 17, L: 25; 20th: R: 18; 40th: 15# fingers slipping on meter, 50th: 9# fingers slipping; 01/22/22: 12# fingers slipping; 03/01/22: 16#; 04/09/22: 5# thumb slipping  Re-eval 07/12/22: R 14# (Saehan gauge)   Time 12    Period Weeks    Status On-going    Target Date 10/04/22     OT LONG TERM GOAL #4   Title Pt. will improve Right 3pt pinch by 2# to be able to hold/open items for cooking    Baseline Eval: R: Pt. unable to engage thumb L: 29#; 20th: R unable; 40th:  unable, fingers slipping; 50th:  unable, fingers slipping; 01/22/22: fingers slipping but pt is now able to grasp a ball peg with 3 point pinch pattern; 03/01/22: same as 10/3 in addition to being able to pick up ball peg from pegboard then place peg back into pegboard with a 3 point pinch pattern; 04/10/23: pt can pick up a ball peg from pegboard and place back into peg board using a 3 point pinch pattern, but when challenged to apply any force with this pinch, fingers slip from position. Unable to register a 3 point pinch on pinch gauge.  Re-eval 07/12/22: unable   Time 12    Period Weeks    Status On-going    Target Date 6/14//24     OT LONG TERM GOAL #5   Title Pt. will improve right hand [facility] skills to be able to independently manipulate buttons, and zippers.  Revised 07/12/22: Pt will use R hand as a good stabilizer to manage clothing fasteners with increased time.   Baseline Eval: Pt. has difficulty managing buttons,a nd zippers. 10th visit:  improving; 20th: unable to engage R hand effectively, performs 1 handed with L; 50th: performs L handed only; 01/22/22: pt can use R hand as a stabilizer with clothing fasteners with mod vc; 03/01/22: same as 01/22/22; 04/09/22: Pt uses R hand as a stabilizer with mod vc  Re-eval 07/12/22: pt manages clothing fasteners with L hand only   Time 12    Period  Weeks    Status On-going    Target Date 10/04/22     OT LONG TERM GOAL #6   Title Pt. will independently write his name    Baseline Eval: Pt. is unable to hold a pen; 20th: pt can grip PenAgain with R hand and can make marks on paper with difficulty, but not yet writing name; 40th:  continues to demonstrate difficulty with grasp of pen; 50th 12/11/21: With assist to set up hand on pen again, pt was able to write first name and initial of last name in 1 1/4" tall letters with fair legibility (pt wants to defer this goal, states this is low on his list of priorities for now)   Time 12    Period Weeks    Status D/c (no longer a priority for pt)   Target Date 04/16/22      OT LONG TERM GOAL #7   Title Pt. will improve FOTO score by 2 points to reflect funational improvement    Baseline Eval:  FOTO score 46 with TR score 52; 20th: 45; 40th: 44;  50th: 46; 01/22/22: 50; 03/01/22: 49; 04/09/22: 49  Re-eval 07/12/22: 56; predicted 58   Time 12    Period Weeks    Status On-going    Target Date 10/04/22            Plan -    Clinical Impression Statement  Focused on R hand grasp patterns this date, including thumb abd to IF, pincer, spherical, and cylindrical grasp.  Instructed pt in using L hand to assist R hand to formulate these grasp patterns using household items for HEP.  Pt benefits from high reps of grasping with paper, pen, can, and tennis ball with therapist providing tactile cues and proprioceptive input through R hand digits to reinforce these prehension patterns with and without grasping against resistance. Improved R coordination knee to nose pattern with eyes closed. Pt will continue to benefit from skilled OT to maximize strength and dexterity skills in the RUE in combination with Botox injections in order to maximize functional use of the RUE for daily tasks and prepare for Vivistim evaluation.      OT Occupational Profile and History Detailed Assessment- Review of Records and  additional review of physical, cognitive, psychosocial history related to current functional performance    Occupational performance deficits (Please refer to evaluation for details): ADL's;IADL's;Education    Rehab Potential Good    Clinical Decision Making Several treatment options, min-mod task modification necessary    Comorbidities Affecting Occupational Performance: May have comorbidities impacting occupational performance    Modification or Assistance to Complete Evaluation  Min-Moderate modification of tasks or assist with assess necessary to complete eval    OT Frequency 2x / week    OT Duration 12 weeks    OT Treatment/Interventions Self-care/ADL training;DME and/or AE instruction;Therapeutic exercise;Ultrasound;Neuromuscular education;Therapeutic activities;Energy conservation;Moist Heat;Patient/family education;Splinting;Functional Mobility Training;Paraffin    Plan Neuro re-ed and therapeutic exercise    Consulted and Agree with Plan of Care Patient             Kathie Dike, M.S. OTR/L  08/09/22, 10:04 AM  ascom [phone]    Presley Raddle, OT 08/09/2022, 10:04 AM

## 2022-08-12 ENCOUNTER — Ambulatory Visit: Payer: 59

## 2022-08-12 DIAGNOSIS — M6281 Muscle weakness (generalized): Secondary | ICD-10-CM | POA: Diagnosis not present

## 2022-08-12 DIAGNOSIS — Z8673 Personal history of transient ischemic attack (TIA), and cerebral infarction without residual deficits: Secondary | ICD-10-CM

## 2022-08-12 DIAGNOSIS — R278 Other lack of coordination: Secondary | ICD-10-CM

## 2022-08-12 NOTE — Therapy (Addendum)
OUTPATIENT OCCUPATIONAL THERAPY NEURO PROGRESS NOTE  Patient Name: David Reeves MRN: [id number] DOB:[birth year], 52 y.o., male Today's Date: 08/12/2022  PCP: Alfredia Ferguson, PA-C REFERRING PROVIDER: Alfredia Ferguson, PA-C   OT End of Session - 08/12/22 1053     Visit Number 10    Number of Visits 24    Date for OT Re-Evaluation 10/04/22    Authorization Type Reporting period beginning 07/12/22    Progress Note Due on Visit 10    OT Start Time 1100    OT Stop Time 1145    OT Time Calculation (min) 45 min    Activity Tolerance Patient tolerated treatment well    Behavior During Therapy [facility] for tasks assessed/performed             Past Medical History:  Diagnosis Date   Anxiety    Aortic atherosclerosis    Aphasia as late effect of cerebrovascular accident (CVA)    Back pain 04/22/2012   Bone spur    Bulging disc 04/22/2012   Chronic pain syndrome    a.) on buprenorphrine   Cognitive dysfunction    Degenerative disc disease    Erectile dysfunction    GERD (gastroesophageal reflux disease)    Hemorrhoids    History of substance use    a.) THC + cocaine + ETOH   HLD (hyperlipidemia)    HTN (hypertension)    Hydrocele, bilateral    Insomnia    Ischemic cerebrovascular accident (CVA) 11/08/2020   a.) MRI brain 11/08/2020 --> extensive hypodensities and loss of gray-white differentiation in the left ACA and left MCA territories with slight hyperdense left MCA and likely hyperdense left distal ICA; etiology felt to be secondary to cocaine use   Lumbar stenosis with neurogenic claudication    Osteoarthritis    Panic anxiety syndrome    Panic disorder    Right inguinal hernia    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    a.) on BZO (diazepam) PRN   Spermatocele (LEFT)    Taking multiple medications for chronic disease    Past Surgical History:  Procedure Laterality Date   CYSTECTOMY     head   HERNIA REPAIR Bilateral 2006,2014   [facility]   SPERMATOCELECTOMY Left 08/05/2022    Procedure: SPERMATOCELECTOMY;  Surgeon: Vanna Scotland, MD;  Location: [facility] ORS;  Service: Urology;  Laterality: Left;   TOE SURGERY Right 2007   Patient Active Problem List   Diagnosis Date Noted   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 06/12/2022   Alterations of sensations following cerebrovascular accident 03/08/2021   Aphasia as late effect of cerebrovascular accident (CVA) 03/08/2021   Chronic pain syndrome 01/23/2021   Primary hypertension 01/23/2021   Erectile dysfunction due to diseases classified elsewhere 01/23/2021   Cognitive dysfunction 01/23/2021   Elevated LDL cholesterol level 01/23/2021   History of stroke with residual deficit 01/23/2021   Primary insomnia 01/23/2021   Acute ischemic stroke 11/10/2020   Closed compression fracture of L1 vertebra 05/24/2016   Lumbar stenosis with neurogenic claudication 11/01/2015   Chronic bilateral low back pain with right-sided sciatica 11/08/2014   Hx of hemorrhoids 11/08/2014   Chronic pain associated with significant psychosocial dysfunction 09/22/2014   Panic disorder 09/22/2014   AB (asthmatic bronchitis) 08/17/2014   Anxiety disorder due to general medical condition 08/17/2014   Lumbosacral spondylosis without myelopathy 08/17/2014   Brash 08/17/2014   Episodic paroxysmal anxiety disorder 08/17/2014   Hernia, inguinal, right 08/17/2014   Inguinal hernia 10/14/2012   ONSET DATE:  11/09/2020  REFERRING DIAG: CVA  THERAPY DIAG:  Muscle weakness (generalized)  Other lack of coordination  H/O ischemic left MCA stroke  Rationale for Evaluation and Treatment Rehabilitation  PERTINENT HISTORY: Pt returns today for re-eval following first round of Botox for RUE spasticity; injections done on 07/11/22.  Pt had reached plateau with OT goals by mid Dec of 2023 as spasticity was limiting progression of coordination/dexterity skills in the R hand.  Pt planning to trial Botox with a second treatment planned 3 months out.  Pt is also in  the process of being evaluated for Vivistim, but has not yet had the Fugl-Meyer Assessment in [place].  Awaiting insurance authorization for this.  Pt. is a 52 y.o. who was diagnosed with a CVA on July 21st, 2022. Pt. completed several weeks of  inpatient rehab at [facility]. After returning to home, Pt. sustained a fall in december of 2022, and was admitted to the hospital with COVID-19, and back pain from the fall. Since the most recent discharge, Pt. has been residing with the ex-wife until the Pt. is ready to return to independent living. Pt. PMHx includes: Bilateral LBP with right sided sciatica, Lumbosacral spondylosis myelopathy, closed compression Fx of L1. Pt. enjoys cooking, and riding motorcycles.   PRECAUTIONS: Fall  SUBJECTIVE: Pt reports he is ready for his assessment tomorrow, stating it will be how it will be.   PAIN:  Are you having pain? Yes: NPRS scale: 1/10 Pain location: low back Pain description: aching Aggravating factors: prolonged sitting or lying down Relieving factors: rest, heat, walking  [facility] OT Assessment        12/11/21 01/22/22 03/01/22 04/09/22 07/12/22               Observation/Other Assessments        Focus on Therapeutic Outcomes (FOTO)   46 50 49 49 56               Coordination        Right 9 Hole Peg Test unable  unable unable Unable (was able to remove 1 peg from hole but unable to pick up peg from dish) unable    Left 9 Hole Peg Test 30 sec  30 sec NT NT (WNL) NT (WNL)               Hand Function        Right Hand Grip (lbs) 35 (3rd setting) 30 lb (3rd);  41 lb (4th setting),  44 lbs (4th setting) 46 lbs (4th setting) 30 lbs (4th setting)    Right Hand Lateral Pinch 9 lbs   slipping 12 lbs (thumb slips) 16 lbs (multiple trials d/t thumb slips) 4 lbs (multiple trials d/t thumb slips) 14 lbs (Saehan)    Right Hand 3 Point Pinch   unable to position thumb to produce 3 pt pinch Unable to position thumb to produce 3 point pinch unable Unable (fingers slip)  unable    Left Hand Grip (lbs) 116     95    Left Hand Lateral Pinch 29 lbs     31    Left 3 point pinch 27 lbs    32     OBJECTIVE:  TODAY'S TREATMENT:  Neuro re-ed: Continued focus on R hand grasp patterns. Thumb abd: 1st CMC, MCP, IP at 0*: scrap of paper between thumb and 2nd MCP joint.  Pt required OT to stabilize digits 2-5 in extension on table top. Pt sustains grasp to hold the paper  with assistance to maintain 2nd digit in extension. Pincer grasp/opposition: thumb pad against 2nd finger pad, pt practiced grasping a pencil between these digits from therapist's hand.  Pt continues to have difficulty achieving this grasp pattern, achieves 1 out of attempts. Cylindrical grasp: Pt practiced grasping a small can from table top with forearm in neutral pron/sup. Requires can to be stabilized to consistently grasp. Spherical grasp: Pt practiced grasping tennis ball from table top. Fairly consistent to grasp ball and hold ball against a tug, with occasional slipping of R thumb causing the ball to drop from R hand.  Practiced high reps of grasping above noted items with tactile cues and proprioceptive input through R hand digits to reinforce these prehension patterns with and without grasping against resistance. Isolated thumb DIP extension/flexion with L hand assist to maintain 2-5 digits in extension. Trialed R hand coordination completing 5x knee to nose pattern with eyes closed, completes in 11 seconds - brings index finger to nose however digits are flexed.   PATIENT EDUCATION: Education details: L hand assisting R hand with grasp patterns at home Person educated: Patient Education method: Explanation, demo Education comprehension: verbalized and demonstrated understanding.      OT Short Term Goals - 6 weeks (08/23/22)      OT SHORT TERM GOAL #1   Title Pt. wil demonstrtae independence with HEPs    Baseline Eval: Pt. currently does not have one, 10th visit:  changes in HEP ongoing; 20th:  instructed in putty exercises, changes ongoing; 40th: continual updates to HEP as pt progresses; 50th: continual updates; 01/22/22: continual updates; 03/01/22: continual updates; 04/09/22: pt demo indep with current hand strengthening and coordination exercises; will adjust as needed if pt is able to schedule Botox injections  Re-eval 07/12/22: will progress with any changes noted with Botox, currently working on active assisted ROM in all digits and digit opposition 08/12/22: pt completes [facility] HEP   Time 6    Period Weeks    Status On-going    Target Date 08/23/22            OT Long Term Goals - 12 weeks (10/04/22)      OT LONG TERM GOAL #1   Title Pt. will improve RUE strength by 2 mm grades to assist with ADLs, and IADLs Revised 07/12/22: Pt will improve R forearm and wrist strength by 1 MM grade in order to turn a door knob with the R hand.   Baseline Eval: Right shoulder flexion, abduction 3/5, elbow flexion, extension wrist extension 3+/5, 10th visit: improving with RUE strength by not yet met goal; 20th visit: R shd flex/abd 4-, elbow flex/ext 4+, wrist flex 4-, wrist ext 4+; 40:  R shd flex/abd 4,  elbow flex/ext 4+, wrist flex 4, wrist ext 4+; 50th visit 8/22: R shoulder flex/abd 4/5, elbow flex/ext 5/5, wrist flex 4, wrist ext 4+; 01/22/22: shoulder flex/abd 4/5, elbow flex/ext 5/5, wrist flex 4, wrist ext 4+; 03/01/22: same as 01/22/22  Re-eval 07/12/22: R shoulder flex/abd 4/5, elbow flex/ext 5/5, forearm sup 3-, wrist ext 3+, wrist flex NT (limited by tone) 08/12/22: R shoulder flex/abd 4/5, forearm sup 3-/5, wrist extension 4/5, wrist flex 2-/5   Time 12    Period Weeks    Status On-going    Target Date 10/04/22     OT LONG TERM GOAL #2   Title Pt. will improve right grip by 10 lbs to prepare for firmly holding objects for IADLs.    Baseline Eval: R: 38#,  L: 124# (In 3rd dynamometer slot); R 35# in 4th slot, 28# in 3rd slot, 40th: 47 in 4th slot; 50th: 12/11/21: 46# on 3rd slot;  01/22/22: 30# on 3rd slot; 03/01/22: 44# 4th slot; 04/09/22: 46#  Re-eval 07/12/22: R grip 30# 08/12/22: firmly holds tennis ball against resistance   Time 12    Period Weeks    Status On-going    Target Date 6/14//24     OT LONG TERM GOAL #3   Title Pt. will improve right lateral pinch strength by 5# to assist with cutting food  Revised 07/12/22: increase by 3# or more    Baseline Eval: R: 17, L: 25; 20th: R: 18; 40th: 15# fingers slipping on meter, 50th: 9# fingers slipping; 01/22/22: 12# fingers slipping; 03/01/22: 16#; 04/09/22: 5# thumb slipping  Re-eval 07/12/22: R 14# (Saehan gauge) 08/12/22: TBD   Time 12    Period Weeks    Status On-going    Target Date 10/04/22     OT LONG TERM GOAL #4   Title Pt. will improve Right 3pt pinch by 2# to be able to hold/open items for cooking    Baseline Eval: R: Pt. unable to engage thumb L: 29#; 20th: R unable; 40th:  unable, fingers slipping; 50th:  unable, fingers slipping; 01/22/22: fingers slipping but pt is now able to grasp a ball peg with 3 point pinch pattern; 03/01/22: same as 10/3 in addition to being able to pick up ball peg from pegboard then place peg back into pegboard with a 3 point pinch pattern; 04/10/23: pt can pick up a ball peg from pegboard and place back into peg board using a 3 point pinch pattern, but when challenged to apply any force with this pinch, fingers slip from position. Unable to register a 3 point pinch on pinch gauge.  Re-eval 07/12/22: unable 08/12/22: unable   Time 12    Period Weeks    Status On-going    Target Date 6/14//24     OT LONG TERM GOAL #5   Title Pt. will improve right hand [facility] skills to be able to independently manipulate buttons, and zippers.  Revised 07/12/22: Pt will use R hand as a good stabilizer to manage clothing fasteners with increased time.   Baseline Eval: Pt. has difficulty managing buttons,a nd zippers. 10th visit:  improving; 20th: unable to engage R hand effectively, performs 1 handed  with L; 50th: performs L handed only; 01/22/22: pt can use R hand as a stabilizer with clothing fasteners with mod vc; 03/01/22: same as 01/22/22; 04/09/22: Pt uses R hand as a stabilizer with mod vc  Re-eval 07/12/22: pt manages clothing fasteners with L hand only 08/12/22: Continues to use L hand only    Time 12    Period Weeks    Status On-going    Target Date 10/04/22     OT LONG TERM GOAL #6   Title Pt. will independently write his name    Baseline Eval: Pt. is unable to hold a pen; 20th: pt can grip PenAgain with R hand and can make marks on paper with difficulty, but not yet writing name; 40th:  continues to demonstrate difficulty with grasp of pen; 50th 12/11/21: With assist to set up hand on pen again, pt was able to write first name and initial of last name in 1 1/4" tall letters with fair legibility (pt wants to defer this goal, states this is low on his list of priorities for now)   Time 12  Period Weeks    Status D/c (no longer a priority for pt)   Target Date 04/16/22      OT LONG TERM GOAL #7   Title Pt. will improve FOTO score by 2 points to reflect funational improvement    Baseline Eval: FOTO score 46 with TR score 52; 20th: 45; 40th: 44;  50th: 46; 01/22/22: 50; 03/01/22: 49; 04/09/22: 49  Re-eval 07/12/22: 56; predicted 58 08/12/22: TBD   Time 12    Period Weeks    Status On-going    Target Date 10/04/22            Plan -    Clinical Impression Statement Goals addressed. Plan to assess measurements next session after pt completes Vivistem assessment Session focused on R hand grasp patterns this date, including thumb abd to IF, pincer, spherical, and cylindrical grasp.  Pt benefits from high reps of grasping with paper, pen, can, and tennis ball with therapist providing tactile cues and proprioceptive input through R hand digits to reinforce these prehension patterns with and without grasping against resistance. Pt reports he is ready but a little nervous for his  vivistem assessment tmrw. Improved R coordination knee to nose pattern with eyes closed. Pt will continue to benefit from skilled OT to maximize strength and dexterity skills in the RUE in combination with Botox injections in order to maximize functional use of the RUE for daily tasks and prepare for Vivistim evaluation.      OT Occupational Profile and History Detailed Assessment- Review of Records and additional review of physical, cognitive, psychosocial history related to current functional performance    Occupational performance deficits (Please refer to evaluation for details): ADL's;IADL's;Education    Rehab Potential Good    Clinical Decision Making Several treatment options, min-mod task modification necessary    Comorbidities Affecting Occupational Performance: May have comorbidities impacting occupational performance    Modification or Assistance to Complete Evaluation  Min-Moderate modification of tasks or assist with assess necessary to complete eval    OT Frequency 2x / week    OT Duration 12 weeks    OT Treatment/Interventions Self-care/ADL training;DME and/or AE instruction;Therapeutic exercise;Ultrasound;Neuromuscular education;Therapeutic activities;Energy conservation;Moist Heat;Patient/family education;Splinting;Functional Mobility Training;Paraffin    Plan Neuro re-ed and therapeutic exercise    Consulted and Agree with Plan of Care Patient             Kathie Dike, M.S. OTR/L  08/12/22, 10:54 AM  ascom [phone]    Presley Raddle, OT 08/12/2022, 10:54 AM

## 2022-08-13 ENCOUNTER — Ambulatory Visit: Payer: 59

## 2022-08-13 ENCOUNTER — Ambulatory Visit: Payer: Medicare Other

## 2022-08-13 ENCOUNTER — Ambulatory Visit: Payer: 59 | Attending: Physical Medicine & Rehabilitation | Admitting: Occupational Therapy

## 2022-08-13 DIAGNOSIS — M6281 Muscle weakness (generalized): Secondary | ICD-10-CM | POA: Diagnosis not present

## 2022-08-13 DIAGNOSIS — I69398 Other sequelae of cerebral infarction: Secondary | ICD-10-CM | POA: Insufficient documentation

## 2022-08-13 DIAGNOSIS — R252 Cramp and spasm: Secondary | ICD-10-CM | POA: Insufficient documentation

## 2022-08-13 DIAGNOSIS — R278 Other lack of coordination: Secondary | ICD-10-CM | POA: Diagnosis not present

## 2022-08-13 NOTE — Therapy (Addendum)
OUTPATIENT OCCUPATIONAL THERAPY VIVISTIM EVALUATION/CURRENT TREATMENT  Patient Name: David Reeves MRN: [id number] DOB:1971-01-06, 52 y.o., male Today's Date: 08/13/2022  PCP: Alfredia Ferguson, PA-C REFERRING PROVIDER: Alfredia Ferguson, PA-C   OT End of Session - 08/13/22 1234     Visit Number 11    Number of Visits 24    Date for OT Re-Evaluation 10/04/22    Authorization Type Reporting period beginning 07/12/22    Progress Note Due on Visit 10    OT Start Time 1230    OT Stop Time 1315    OT Time Calculation (min) 45 min    Activity Tolerance Patient tolerated treatment well    Behavior During Therapy [facility] for tasks assessed/performed             Past Medical History:  Diagnosis Date   Anxiety    Aortic atherosclerosis    Aphasia as late effect of cerebrovascular accident (CVA)    Back pain 04/22/2012   Bone spur    Bulging disc 04/22/2012   Chronic pain syndrome    a.) on buprenorphrine   Cognitive dysfunction    Degenerative disc disease    Erectile dysfunction    GERD (gastroesophageal reflux disease)    Hemorrhoids    History of substance use    a.) THC + cocaine + ETOH   HLD (hyperlipidemia)    HTN (hypertension)    Hydrocele, bilateral    Insomnia    Ischemic cerebrovascular accident (CVA) 11/08/2020   a.) MRI brain 11/08/2020 --> extensive hypodensities and loss of gray-white differentiation in the left ACA and left MCA territories with slight hyperdense left MCA and likely hyperdense left distal ICA; etiology felt to be secondary to cocaine use   Lumbar stenosis with neurogenic claudication    Osteoarthritis    Panic anxiety syndrome    Panic disorder    Right inguinal hernia    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    a.) on BZO (diazepam) PRN   Spermatocele (LEFT)    Taking multiple medications for chronic disease    Past Surgical History:  Procedure Laterality Date   CYSTECTOMY     head   HERNIA REPAIR Bilateral 2006,2014   [facility]    SPERMATOCELECTOMY Left 08/05/2022   Procedure: SPERMATOCELECTOMY;  Surgeon: Vanna Scotland, MD;  Location: [facility] ORS;  Service: Urology;  Laterality: Left;   TOE SURGERY Right 2007   Patient Active Problem List   Diagnosis Date Noted   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 06/12/2022   Alterations of sensations following cerebrovascular accident 03/08/2021   Aphasia as late effect of cerebrovascular accident (CVA) 03/08/2021   Chronic pain syndrome 01/23/2021   Primary hypertension 01/23/2021   Erectile dysfunction due to diseases classified elsewhere 01/23/2021   Cognitive dysfunction 01/23/2021   Elevated LDL cholesterol level 01/23/2021   History of stroke with residual deficit 01/23/2021   Primary insomnia 01/23/2021   Acute ischemic stroke 11/10/2020   Closed compression fracture of L1 vertebra 05/24/2016   Lumbar stenosis with neurogenic claudication 11/01/2015   Chronic bilateral low back pain with right-sided sciatica 11/08/2014   Hx of hemorrhoids 11/08/2014   Chronic pain associated with significant psychosocial dysfunction 09/22/2014   Panic disorder 09/22/2014   AB (asthmatic bronchitis) 08/17/2014   Anxiety disorder due to general medical condition 08/17/2014   Lumbosacral spondylosis without myelopathy 08/17/2014   Brash 08/17/2014   Episodic paroxysmal anxiety disorder 08/17/2014   Hernia, inguinal, right 08/17/2014   Inguinal hernia 10/14/2012    Rationale  for Evaluation and Treatment Rehabilitation  ONSET DATE: 11/09/20  REFERRING DIAG: CVA  THERAPY DIAG:  Muscle weakness (generalized)  Other lack of coordination  SUBJECTIVE:   SUBJECTIVE STATEMENT: It sounds like a long process (re: vivistim) Pt accompanied by: self  PERTINENT HISTORY: Pt. is a 52 y.o. who was diagnosed with a CVA on July 21st, 2022. Pt. completed several weeks of  inpatient rehab at [facility]. After returning to home, Pt. sustained a fall in december of 2022, and was admitted to the hospital with  COVID-19, and back pain from the fall. Since the most recent discharge, Pt. has been residing with the ex-wife until the Pt. is ready to return to independent living. Pt. PMHx includes: Bilateral LBP with right sided sciatica, Lumbosacral spondylosis myelopathy, closed compression Fx of L1. Pt. enjoys cooking, and riding motorcycles.   PRECAUTIONS: Fall  WEIGHT BEARING RESTRICTIONS No  PAIN:  Are you having pain? No  FALLS: Has patient fallen in last 6 months? No  LIVING ENVIRONMENT: Lives with: lives with their family  PLOF: Independent  PATIENT GOALS to qualify for vivistim  OBJECTIVE:   HAND DOMINANCE: Right (but using Lt hand as dominant hand since stroke)   SENSATION: hypersensitive  MUSCLE TONE: RUE: Hypotonic  COGNITION: Overall cognitive status:  decreased awareness, impulsive, word finding difficulties  PERCEPTION: Not tested  PRAXIS: Not tested   PATIENT EDUCATION: Education details: vivistim criteria Person educated: Patient Education method: Explanation Education comprehension: verbalized understanding  --------------------------------------------------------------------------------------------------------------------- [name]: IADL Scale  A. Ability to Use Telephone  Dials a few well-known numbers - 1   B. Shopping Takes care of all shopping needs independently - 1  C. Food Preparation Plans, prepares and serves adequate meals independently - 1  D. Housekeeping Performs light daily tasks but cannot maintain acceptable level of cleanliness - 1  E. Laundry  Launders small items-rinses stockings, etc. - 1  F. Mode of Transportation  Travels independently on public transportation or drives own car - 1  G. Responsibility for Own Medications Is responsible for taking medication in correct dosages at correct time - 1  H. Ability to [employer] day to day purchases, but needs help with banking, major purchases - 1   TOTAL  LAWTON BRODY SCORE: 8/8[  -----------------------------------------------------------  [name] Index of Independence in ADL INDEPENDENCE (1) DEPENDENCE (0)  NO supervision, direction or personal assistance WITH supervision, direction, personal assistance or total care   ACTIVITIES 1 or 0  Bathing 1  Dressing 1  Toileting 1  Transferring 1  Continence 1  Feeding 1    KATZ TOTAL: 6/6   ADLS: Pt is mod I level for ADLS however does report increased time to perform and question safety with IADLS -----------------------------------------------------------  FUGL-MEYER ASSESSMENT   A. Upper Extremity  I. Reflex Activity: Flexors (elbow) 2  Extensors (triceps) 2  Total 4/4     II. Volitional Movement within Synergies: Flexor Synergy:   Shoulder Retraction 2  Shoulder elevation 2  Shoulder Abduction 1  External Rotation 1  Elbow Flexion 2  Forearm Supination 1  Extensor Synergy:   Shoulder Adduction/Internal Rotation 2  Elbow Extension 2  Forearm Pronation 1  Total 14/18   III. Volitional Mixing of Synergies: Hand to Lumbar Spine 1  Shoulder Flexion 2  Pronation/Supination 1  Total 4/6   IV. Volitional Movement with Little to No Synergy Shoulder Abduction 2  Shoulder Flexion 90+ 1  Pronation/Supination 1  Total 4/6    B. Wrist Stability  of Wrist at 15* 2  Repeated Flex/Ext 1  Circumduction 0  Stability at 15*Wrist, 0*Elbow 2  Flexion/Extension - Elbow 0* 1  Total 6/10   C. Hand Mass Flexion 1  Mass Extension 1  Total 2/4    D. Grasp Hook  0  Thumb Adduction - paper 1  Pincer - pen 0  Cylindrical - cup 1  Spherical - ball 2  Total 4/10   E. Coordination/Speed Tremor 2  Dysmetria 0  Time 0  Total 2/6    FUGL-MEYER ASSESSMENT: UPPER EXTREMITY A-E TOTAL: 40/66    ---------------------------------------------------------------------------------------------------------------------    ASSESSMENT:  CLINICAL IMPRESSION: Patient is a 52 yr  old male referred to OT for assessment to determine qualification for implantable nerve stimulator to address RUE functioning.  Completed Fugl Meyer UE assessment, as well as [name] index of Independence in ADL, and [name] IADL Scale.  Patient has adequate active movement, sensation, cognition, and motivation throughout RUE with limitations in forearm pronation/supination, wrist movement, thumb abduction, and hand function.  Patient is motivated to improve functional use and movement in his RUE arm.  PERFORMANCE DEFICITS in functional skills including IADLs, coordination, dexterity, tone, ROM, strength, Fine motor control, Gross motor control, body mechanics, and UE functional use, .   IMPAIRMENTS are limiting patient from IADLs, work, leisure, and social participation.   COMORBIDITIES may have co-morbidities  that affects occupational performance. Patient will benefit from skilled OT to address above impairments and improve overall function.  MODIFICATION OR ASSISTANCE TO COMPLETE EVALUATION: No modification of tasks or assist necessary to complete an evaluation.  OT OCCUPATIONAL PROFILE AND HISTORY: Problem focused assessment: Including review of records relating to presenting problem.  CLINICAL DECISION MAKING: Moderate - several treatment options, min-mod task modification necessary  REHAB POTENTIAL: Good  EVALUATION COMPLEXITY: Low  PLAN:  Pt is current patient at [facility] and will continue through current POC. Pt qualified for vivistim from a rehab perspective based on Fugl-Meyer score. Pt will be referred as appropriate for further medical evaluation to determine if pt is surgical candidate.    Sheran Lawless, OT 08/13/2022, 12:36 PM

## 2022-08-15 ENCOUNTER — Ambulatory Visit: Payer: 59

## 2022-08-15 ENCOUNTER — Ambulatory Visit: Payer: Medicare Other

## 2022-08-19 ENCOUNTER — Ambulatory Visit: Payer: 59

## 2022-08-19 DIAGNOSIS — Z8673 Personal history of transient ischemic attack (TIA), and cerebral infarction without residual deficits: Secondary | ICD-10-CM

## 2022-08-19 DIAGNOSIS — R278 Other lack of coordination: Secondary | ICD-10-CM | POA: Diagnosis not present

## 2022-08-19 DIAGNOSIS — M6281 Muscle weakness (generalized): Secondary | ICD-10-CM

## 2022-08-19 NOTE — Therapy (Signed)
OUTPATIENT OCCUPATIONAL THERAPY NEURO PROGRESS NOTE  Patient Name: David Reeves MRN: [id number] DOB:[birth year], 52 y.o., male Today's Date: 08/12/2022  PCP: Alfredia Ferguson, PA-C REFERRING PROVIDER: Alfredia Ferguson, PA-C   OT End of Session - 08/12/22 1053     Visit Number 10    Number of Visits 24    Date for OT Re-Evaluation 10/04/22    Authorization Type Reporting period beginning 07/12/22    Progress Note Due on Visit 10    OT Start Time 1100    OT Stop Time 1145    OT Time Calculation (min) 45 min    Activity Tolerance Patient tolerated treatment well    Behavior During Therapy [facility] for tasks assessed/performed             Past Medical History:  Diagnosis Date   Anxiety    Aortic atherosclerosis    Aphasia as late effect of cerebrovascular accident (CVA)    Back pain 04/22/2012   Bone spur    Bulging disc 04/22/2012   Chronic pain syndrome    a.) on buprenorphrine   Cognitive dysfunction    Degenerative disc disease    Erectile dysfunction    GERD (gastroesophageal reflux disease)    Hemorrhoids    History of substance use    a.) THC + cocaine + ETOH   HLD (hyperlipidemia)    HTN (hypertension)    Hydrocele, bilateral    Insomnia    Ischemic cerebrovascular accident (CVA) 11/08/2020   a.) MRI brain 11/08/2020 --> extensive hypodensities and loss of gray-white differentiation in the left ACA and left MCA territories with slight hyperdense left MCA and likely hyperdense left distal ICA; etiology felt to be secondary to cocaine use   Lumbar stenosis with neurogenic claudication    Osteoarthritis    Panic anxiety syndrome    Panic disorder    Right inguinal hernia    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    a.) on BZO (diazepam) PRN   Spermatocele (LEFT)    Taking multiple medications for chronic disease    Past Surgical History:  Procedure Laterality Date   CYSTECTOMY     head   HERNIA REPAIR Bilateral 2006,2014   [facility]   SPERMATOCELECTOMY Left 08/05/2022    Procedure: SPERMATOCELECTOMY;  Surgeon: Vanna Scotland, MD;  Location: [facility] ORS;  Service: Urology;  Laterality: Left;   TOE SURGERY Right 2007   Patient Active Problem List   Diagnosis Date Noted   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 06/12/2022   Alterations of sensations following cerebrovascular accident 03/08/2021   Aphasia as late effect of cerebrovascular accident (CVA) 03/08/2021   Chronic pain syndrome 01/23/2021   Primary hypertension 01/23/2021   Erectile dysfunction due to diseases classified elsewhere 01/23/2021   Cognitive dysfunction 01/23/2021   Elevated LDL cholesterol level 01/23/2021   History of stroke with residual deficit 01/23/2021   Primary insomnia 01/23/2021   Acute ischemic stroke 11/10/2020   Closed compression fracture of L1 vertebra 05/24/2016   Lumbar stenosis with neurogenic claudication 11/01/2015   Chronic bilateral low back pain with right-sided sciatica 11/08/2014   Hx of hemorrhoids 11/08/2014   Chronic pain associated with significant psychosocial dysfunction 09/22/2014   Panic disorder 09/22/2014   AB (asthmatic bronchitis) 08/17/2014   Anxiety disorder due to general medical condition 08/17/2014   Lumbosacral spondylosis without myelopathy 08/17/2014   Brash 08/17/2014   Episodic paroxysmal anxiety disorder 08/17/2014   Hernia, inguinal, right 08/17/2014   Inguinal hernia 10/14/2012   ONSET DATE:  11/09/2020  REFERRING DIAG: CVA  THERAPY DIAG:  Muscle weakness (generalized)  Other lack of coordination  H/O ischemic left MCA stroke  Rationale for Evaluation and Treatment Rehabilitation  PERTINENT HISTORY: Pt returns today for re-eval following first round of Botox for RUE spasticity; injections done on 07/11/22.  Pt had reached plateau with OT goals by mid Dec of 2023 as spasticity was limiting progression of coordination/dexterity skills in the R hand.  Pt planning to trial Botox with a second treatment planned 3 months out.  Pt is also in  the process of being evaluated for Vivistim, but has not yet had the Fugl-Meyer Assessment in [place].  Awaiting insurance authorization for this.  Pt. is a 52 y.o. who was diagnosed with a CVA on July 21st, 2022. Pt. completed several weeks of  inpatient rehab at [facility]. After returning to home, Pt. sustained a fall in december of 2022, and was admitted to the hospital with COVID-19, and back pain from the fall. Since the most recent discharge, Pt. has been residing with the ex-wife until the Pt. is ready to return to independent living. Pt. PMHx includes: Bilateral LBP with right sided sciatica, Lumbosacral spondylosis myelopathy, closed compression Fx of L1. Pt. enjoys cooking, and riding motorcycles.   PRECAUTIONS: Fall  SUBJECTIVE: Pt reports he is ready for his assessment tomorrow, stating it will be how it will be.   PAIN:  Are you having pain? Yes: NPRS scale: 1/10 Pain location: low back Pain description: aching Aggravating factors: prolonged sitting or lying down Relieving factors: rest, heat, walking  [facility] OT Assessment        12/11/21 01/22/22 03/01/22 04/09/22 07/12/22               Observation/Other Assessments        Focus on Therapeutic Outcomes (FOTO)   46 50 49 49 56               Coordination        Right 9 Hole Peg Test unable  unable unable Unable (was able to remove 1 peg from hole but unable to pick up peg from dish) unable    Left 9 Hole Peg Test 30 sec  30 sec NT NT (WNL) NT (WNL)               Hand Function        Right Hand Grip (lbs) 35 (3rd setting) 30 lb (3rd);  41 lb (4th setting),  44 lbs (4th setting) 46 lbs (4th setting) 30 lbs (4th setting)    Right Hand Lateral Pinch 9 lbs   slipping 12 lbs (thumb slips) 16 lbs (multiple trials d/t thumb slips) 4 lbs (multiple trials d/t thumb slips) 14 lbs (Saehan)    Right Hand 3 Point Pinch   unable to position thumb to produce 3 pt pinch Unable to position thumb to produce 3 point pinch unable Unable (fingers slip)  unable    Left Hand Grip (lbs) 116     95    Left Hand Lateral Pinch 29 lbs     31    Left 3 point pinch 27 lbs    32     OBJECTIVE:  TODAY'S TREATMENT:  Neuro re-ed: Continued focus on R hand grasp patterns. Thumb abd: 1st CMC, MCP, IP at 0*: scrap of paper between thumb and 2nd MCP joint.  Pt required OT to stabilize digits 2-5 in extension on table top. Pt sustains grasp to hold the paper  with assistance to maintain 2nd digit in extension. Pincer grasp/opposition: thumb pad against 2nd finger pad, pt practiced grasping a pencil between these digits from therapist's hand.  Pt continues to have difficulty achieving this grasp pattern, achieves 1 out of attempts. Cylindrical grasp: Pt practiced grasping a small can from table top with forearm in neutral pron/sup. Requires can to be stabilized to consistently grasp. Spherical grasp: Pt practiced grasping tennis ball from table top. Fairly consistent to grasp ball and hold ball against a tug, with occasional slipping of R thumb causing the ball to drop from R hand.  Practiced high reps of grasping above noted items with tactile cues and proprioceptive input through R hand digits to reinforce these prehension patterns with and without grasping against resistance. Isolated thumb DIP extension/flexion with L hand assist to maintain 2-5 digits in extension. Trialed R hand coordination completing 5x knee to nose pattern with eyes closed, completes in 11 seconds - brings index finger to nose however digits are flexed.   PATIENT EDUCATION: Education details: L hand assisting R hand with grasp patterns at home Person educated: Patient Education method: Explanation, demo Education comprehension: verbalized and demonstrated understanding.      OT Short Term Goals - 6 weeks (08/23/22)      OT SHORT TERM GOAL #1   Title Pt. wil demonstrtae independence with HEPs    Baseline Eval: Pt. currently does not have one, 10th visit:  changes in HEP ongoing; 20th:  instructed in putty exercises, changes ongoing; 40th: continual updates to HEP as pt progresses; 50th: continual updates; 01/22/22: continual updates; 03/01/22: continual updates; 04/09/22: pt demo indep with current hand strengthening and coordination exercises; will adjust as needed if pt is able to schedule Botox injections  Re-eval 07/12/22: will progress with any changes noted with Botox, currently working on active assisted ROM in all digits and digit opposition 08/12/22: pt completes [facility] HEP   Time 6    Period Weeks    Status On-going    Target Date 08/23/22            OT Long Term Goals - 12 weeks (10/04/22)      OT LONG TERM GOAL #1   Title Pt. will improve RUE strength by 2 mm grades to assist with ADLs, and IADLs Revised 07/12/22: Pt will improve R forearm and wrist strength by 1 MM grade in order to turn a door knob with the R hand.   Baseline Eval: Right shoulder flexion, abduction 3/5, elbow flexion, extension wrist extension 3+/5, 10th visit: improving with RUE strength by not yet met goal; 20th visit: R shd flex/abd 4-, elbow flex/ext 4+, wrist flex 4-, wrist ext 4+; 40:  R shd flex/abd 4,  elbow flex/ext 4+, wrist flex 4, wrist ext 4+; 50th visit 8/22: R shoulder flex/abd 4/5, elbow flex/ext 5/5, wrist flex 4, wrist ext 4+; 01/22/22: shoulder flex/abd 4/5, elbow flex/ext 5/5, wrist flex 4, wrist ext 4+; 03/01/22: same as 01/22/22  Re-eval 07/12/22: R shoulder flex/abd 4/5, elbow flex/ext 5/5, forearm sup 3-, wrist ext 3+, wrist flex NT (limited by tone) 08/12/22: R shoulder flex/abd 4/5, forearm sup 3-/5, wrist extension 4/5, wrist flex 2-/5   Time 12    Period Weeks    Status On-going    Target Date 10/04/22     OT LONG TERM GOAL #2   Title Pt. will improve right grip by 10 lbs to prepare for firmly holding objects for IADLs.    Baseline Eval: R: 38#,  L: 124# (In 3rd dynamometer slot); R 35# in 4th slot, 28# in 3rd slot, 40th: 47 in 4th slot; 50th: 12/11/21: 46# on 3rd slot;  01/22/22: 30# on 3rd slot; 03/01/22: 44# 4th slot; 04/09/22: 46#  Re-eval 07/12/22: R grip 30# 08/12/22: firmly holds tennis ball against resistance   Time 12    Period Weeks    Status On-going    Target Date 6/14//24     OT LONG TERM GOAL #3   Title Pt. will improve right lateral pinch strength by 5# to assist with cutting food  Revised 07/12/22: increase by 3# or more    Baseline Eval: R: 17, L: 25; 20th: R: 18; 40th: 15# fingers slipping on meter, 50th: 9# fingers slipping; 01/22/22: 12# fingers slipping; 03/01/22: 16#; 04/09/22: 5# thumb slipping  Re-eval 07/12/22: R 14# (Saehan gauge) 08/12/22: TBD   Time 12    Period Weeks    Status On-going    Target Date 10/04/22     OT LONG TERM GOAL #4   Title Pt. will improve Right 3pt pinch by 2# to be able to hold/open items for cooking    Baseline Eval: R: Pt. unable to engage thumb L: 29#; 20th: R unable; 40th:  unable, fingers slipping; 50th:  unable, fingers slipping; 01/22/22: fingers slipping but pt is now able to grasp a ball peg with 3 point pinch pattern; 03/01/22: same as 10/3 in addition to being able to pick up ball peg from pegboard then place peg back into pegboard with a 3 point pinch pattern; 04/10/23: pt can pick up a ball peg from pegboard and place back into peg board using a 3 point pinch pattern, but when challenged to apply any force with this pinch, fingers slip from position. Unable to register a 3 point pinch on pinch gauge.  Re-eval 07/12/22: unable 08/12/22: unable   Time 12    Period Weeks    Status On-going    Target Date 6/14//24     OT LONG TERM GOAL #5   Title Pt. will improve right hand [facility] skills to be able to independently manipulate buttons, and zippers.  Revised 07/12/22: Pt will use R hand as a good stabilizer to manage clothing fasteners with increased time.   Baseline Eval: Pt. has difficulty managing buttons,a nd zippers. 10th visit:  improving; 20th: unable to engage R hand effectively, performs 1 handed  with L; 50th: performs L handed only; 01/22/22: pt can use R hand as a stabilizer with clothing fasteners with mod vc; 03/01/22: same as 01/22/22; 04/09/22: Pt uses R hand as a stabilizer with mod vc  Re-eval 07/12/22: pt manages clothing fasteners with L hand only 08/12/22: Continues to use L hand only    Time 12    Period Weeks    Status On-going    Target Date 10/04/22     OT LONG TERM GOAL #6   Title Pt. will independently write his name    Baseline Eval: Pt. is unable to hold a pen; 20th: pt can grip PenAgain with R hand and can make marks on paper with difficulty, but not yet writing name; 40th:  continues to demonstrate difficulty with grasp of pen; 50th 12/11/21: With assist to set up hand on pen again, pt was able to write first name and initial of last name in 1 1/4" tall letters with fair legibility (pt wants to defer this goal, states this is low on his list of priorities for now)   Time 12  Period Weeks    Status D/c (no longer a priority for pt)   Target Date 04/16/22      OT LONG TERM GOAL #7   Title Pt. will improve FOTO score by 2 points to reflect funational improvement    Baseline Eval: FOTO score 46 with TR score 52; 20th: 45; 40th: 44;  50th: 46; 01/22/22: 50; 03/01/22: 49; 04/09/22: 49  Re-eval 07/12/22: 56; predicted 58 08/12/22: TBD   Time 12    Period Weeks    Status On-going    Target Date 10/04/22            Plan -    Clinical Impression Statement Goals addressed. Plan to assess measurements next session after pt completes Vivistem assessment Session focused on R hand grasp patterns this date, including thumb abd to IF, pincer, spherical, and cylindrical grasp.  Pt benefits from high reps of grasping with paper, pen, can, and tennis ball with therapist providing tactile cues and proprioceptive input through R hand digits to reinforce these prehension patterns with and without grasping against resistance. Pt reports he is ready but a little nervous for his  vivistem assessment tmrw. Improved R coordination knee to nose pattern with eyes closed. Pt will continue to benefit from skilled OT to maximize strength and dexterity skills in the RUE in combination with Botox injections in order to maximize functional use of the RUE for daily tasks and prepare for Vivistim evaluation.      OT Occupational Profile and History Detailed Assessment- Review of Records and additional review of physical, cognitive, psychosocial history related to current functional performance    Occupational performance deficits (Please refer to evaluation for details): ADL's;IADL's;Education    Rehab Potential Good    Clinical Decision Making Several treatment options, min-mod task modification necessary    Comorbidities Affecting Occupational Performance: May have comorbidities impacting occupational performance    Modification or Assistance to Complete Evaluation  Min-Moderate modification of tasks or assist with assess necessary to complete eval    OT Frequency 2x / week    OT Duration 12 weeks    OT Treatment/Interventions Self-care/ADL training;DME and/or AE instruction;Therapeutic exercise;Ultrasound;Neuromuscular education;Therapeutic activities;Energy conservation;Moist Heat;Patient/family education;Splinting;Functional Mobility Training;Paraffin    Plan Neuro re-ed and therapeutic exercise    Consulted and Agree with Plan of Care Patient             Kathie Dike, M.S. OTR/L  08/12/22, 10:54 AM  ascom [phone]    Presley Raddle, OT 08/12/2022, 10:54 AM

## 2022-08-20 ENCOUNTER — Ambulatory Visit: Payer: 59

## 2022-08-20 ENCOUNTER — Encounter: Payer: Self-pay | Admitting: Urology

## 2022-08-20 ENCOUNTER — Ambulatory Visit: Payer: Medicare Other

## 2022-08-21 ENCOUNTER — Ambulatory Visit: Payer: 59

## 2022-08-21 DIAGNOSIS — I69351 Hemiplegia and hemiparesis following cerebral infarction affecting right dominant side: Secondary | ICD-10-CM | POA: Diagnosis not present

## 2022-08-21 DIAGNOSIS — Z79891 Long term (current) use of opiate analgesic: Secondary | ICD-10-CM | POA: Diagnosis not present

## 2022-08-21 DIAGNOSIS — M791 Myalgia, unspecified site: Secondary | ICD-10-CM | POA: Diagnosis not present

## 2022-08-21 DIAGNOSIS — I10 Essential (primary) hypertension: Secondary | ICD-10-CM | POA: Diagnosis not present

## 2022-08-21 DIAGNOSIS — I69322 Dysarthria following cerebral infarction: Secondary | ICD-10-CM | POA: Diagnosis not present

## 2022-08-21 DIAGNOSIS — G8929 Other chronic pain: Secondary | ICD-10-CM | POA: Diagnosis not present

## 2022-08-21 DIAGNOSIS — G8921 Chronic pain due to trauma: Secondary | ICD-10-CM | POA: Diagnosis not present

## 2022-08-22 ENCOUNTER — Ambulatory Visit: Payer: 59

## 2022-08-22 ENCOUNTER — Ambulatory Visit: Payer: Medicare Other

## 2022-08-24 ENCOUNTER — Other Ambulatory Visit: Payer: Self-pay | Admitting: Physical Medicine & Rehabilitation

## 2022-08-24 DIAGNOSIS — I69398 Other sequelae of cerebral infarction: Secondary | ICD-10-CM

## 2022-08-27 ENCOUNTER — Ambulatory Visit: Payer: Medicare Other

## 2022-08-27 ENCOUNTER — Ambulatory Visit: Payer: 59

## 2022-08-27 ENCOUNTER — Ambulatory Visit: Payer: 59 | Admitting: Occupational Therapy

## 2022-08-28 ENCOUNTER — Telehealth: Payer: Self-pay | Admitting: Physical Medicine & Rehabilitation

## 2022-08-28 DIAGNOSIS — R252 Cramp and spasm: Secondary | ICD-10-CM

## 2022-08-28 NOTE — Telephone Encounter (Signed)
Patient called a referral to neurosurgery and is requesting a referral to Dr Conchita Paris with [facility]

## 2022-08-29 ENCOUNTER — Ambulatory Visit: Payer: Medicare Other

## 2022-08-29 ENCOUNTER — Ambulatory Visit: Payer: 59

## 2022-08-29 NOTE — Addendum Note (Signed)
Addended by: Faith Rogue T on: 08/29/2022 08:55 AM   Modules accepted: Orders

## 2022-08-29 NOTE — Telephone Encounter (Signed)
I talked to patient about his vivistim evaluation. For some reason a referral to NS was not made. I will go ahead and make referral to Dr. Conchita Paris. I also reached out to [employer] who did his vivistim eval to let her know. I told the patient that if he doesn't hear anything by Monday to give our office another call.   ZTS

## 2022-09-02 DIAGNOSIS — Z8673 Personal history of transient ischemic attack (TIA), and cerebral infarction without residual deficits: Secondary | ICD-10-CM | POA: Diagnosis not present

## 2022-09-03 ENCOUNTER — Ambulatory Visit: Payer: 59

## 2022-09-03 ENCOUNTER — Ambulatory Visit: Payer: Medicare Other

## 2022-09-03 ENCOUNTER — Ambulatory Visit (INDEPENDENT_AMBULATORY_CARE_PROVIDER_SITE_OTHER): Payer: 59 | Admitting: Urology

## 2022-09-03 ENCOUNTER — Encounter: Payer: Self-pay | Admitting: Urology

## 2022-09-03 VITALS — BP 101/69 | HR 84 | Ht 73.0 in | Wt 230.0 lb

## 2022-09-03 DIAGNOSIS — N501 Vascular disorders of male genital organs: Secondary | ICD-10-CM

## 2022-09-03 DIAGNOSIS — N433 Hydrocele, unspecified: Secondary | ICD-10-CM

## 2022-09-03 NOTE — Progress Notes (Incomplete)
I,Amy L Pierron,acting as a scribe for Vanna Scotland, MD.,have documented all relevant documentation on the behalf of Vanna Scotland, MD,as directed by  Vanna Scotland, MD while in the presence of Vanna Scotland, MD.  09/03/2022 4:05 PM   David Reeves 1970-12-28 [id number]  Referring provider: Alfredia Ferguson, PA-C [address] [place],  [place] [address]  Chief Complaint  Patient presents with   Follow-up    HPI: 52 year-old male returns today for post-operative check.  He is a to the operating room on 08/05/2022 for symptomatic left-sided spermatocele, 8 cm.  He sent a MyChart message a few weeks ago concerned about scrotal swelling.  He mentioned the swollen area has gone down a little, but not as much as he thought it would.   PMH: Past Medical History:  Diagnosis Date   Anxiety    Aortic atherosclerosis (HCC)    Aphasia as late effect of cerebrovascular accident (CVA)    Back pain 04/22/2012   Bone spur    Bulging disc 04/22/2012   Chronic pain syndrome    a.) on buprenorphrine   Cognitive dysfunction    Degenerative disc disease    Erectile dysfunction    GERD (gastroesophageal reflux disease)    Hemorrhoids    History of substance use    a.) THC + cocaine + ETOH   HLD (hyperlipidemia)    HTN (hypertension)    Hydrocele, bilateral    Insomnia    Ischemic cerebrovascular accident (CVA) (HCC) 11/08/2020   a.) MRI brain 11/08/2020 --> extensive hypodensities and loss of gray-white differentiation in the left ACA and left MCA territories with slight hyperdense left MCA and likely hyperdense left distal ICA; etiology felt to be secondary to cocaine use   Lumbar stenosis with neurogenic claudication    Osteoarthritis    Panic anxiety syndrome    Panic disorder    Right inguinal hernia    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    a.) on BZO (diazepam) PRN   Spermatocele (LEFT)    Taking multiple medications for chronic disease     Surgical  History: Past Surgical History:  Procedure Laterality Date   CYSTECTOMY     head   HERNIA REPAIR Bilateral 2006,2014   [facility]   SPERMATOCELECTOMY Left 08/05/2022   Procedure: SPERMATOCELECTOMY;  Surgeon: Vanna Scotland, MD;  Location: [facility] ORS;  Service: Urology;  Laterality: Left;   TOE SURGERY Right 2007    Home Medications:  Allergies as of 09/03/2022       Reactions   Duloxetine    Other reaction(s): Other (See Comments) Increased temperature, sweating, uncontrolled shaking, aggressive thoughts   Amitriptyline Other (See Comments)   Urine retention   Buspirone Other (See Comments)   Kidney pain   Ciprofloxacin Other (See Comments)   hallucinations   Doxepin Other (See Comments)   "Didn't feel right"   Escitalopram Other (See Comments)   Agitation   Olanzapine    Penicillins Other (See Comments)   Unknown -- childhood reaction   Sertraline Other (See Comments)   Urine retention   Trazodone Other (See Comments)   abd pain, urine retention        Medication List        Accurate as of Sep 03, 2022  4:05 PM. If you have any questions, ask your nurse or doctor.          acetaminophen 500 MG tablet Commonly known as: TYLENOL Take 1,000 mg by mouth every 6 (six) hours  as needed for mild pain.   amLODipine 5 MG tablet Commonly known as: NORVASC TAKE 1 TABLET (5 MG TOTAL) BY MOUTH DAILY. What changed: when to take this   aspirin EC 81 MG tablet Take 81 mg by mouth daily. Swallow whole.   atorvastatin 80 MG tablet Commonly known as: LIPITOR TAKE 1 TABLET BY MOUTH EVERY DAY What changed: when to take this   buprenorphine 15 MCG/HR Commonly known as: BUTRANS 1 patch once a week. Thursdays   calcium carbonate 500 MG chewable tablet Commonly known as: TUMS - dosed in mg elemental calcium Chew 1 tablet by mouth as needed for indigestion or heartburn.   ALKA-SELTZER ANTACID PO Take 1 tablet by mouth as needed.   celecoxib 100 MG capsule Commonly known  as: CELEBREX TAKE 1 CAPSULE BY MOUTH TWICE  DAILY   diazepam 10 MG tablet Commonly known as: VALIUM Take 10 mg by mouth 2 (two) times daily.   donepezil 10 MG tablet Commonly known as: ARICEPT TAKE 1 TABLET BY MOUTH AT  BEDTIME   FISH OIL PO Take 1 tablet by mouth daily at 6 (six) AM.   gabapentin 400 MG capsule Commonly known as: NEURONTIN TAKE 1 CAPSULE BY MOUTH 3 TIMES  DAILY   HYDROcodone-acetaminophen 5-325 MG tablet Commonly known as: NORCO/VICODIN Take 1-2 tablets by mouth every 6 (six) hours as needed for moderate pain.   lidocaine 5 % Commonly known as: LIDODERM 1 patch daily. Lower back and upper shoulder   multivitamin tablet Take 1 tablet by mouth daily.   prazosin 1 MG capsule Commonly known as: MINIPRESS TAKE 2 CAPSULES BY MOUTH AT  BEDTIME , DO NOT TAKE WITH  AMBIEN What changed: See the new instructions.   tiZANidine 4 MG tablet Commonly known as: ZANAFLEX TAKE 1 AND 1/2 TABLETS(6 MG) BY MOUTH EVERY 6 HOURS AS NEEDED FOR MUSCLE SPASMS What changed: Another medication with the same name was removed. Continue taking this medication, and follow the directions you see here. Changed by: Vanna Scotland, MD   VITAMIN C PO Take 1 tablet by mouth daily at 6 (six) AM.   Vitamin D3 50 MCG (2000 UT) Tabs Take 1 tablet by mouth daily at 6 (six) AM.        Allergies:  Allergies  Allergen Reactions   Duloxetine     Other reaction(s): Other (See Comments) Increased temperature, sweating, uncontrolled shaking, aggressive thoughts   Amitriptyline Other (See Comments)    Urine retention   Buspirone Other (See Comments)    Kidney pain   Ciprofloxacin Other (See Comments)    hallucinations   Doxepin Other (See Comments)    "Didn't feel right"   Escitalopram Other (See Comments)    Agitation   Olanzapine    Penicillins Other (See Comments)    Unknown -- childhood reaction   Sertraline Other (See Comments)    Urine retention   Trazodone Other (See  Comments)    abd pain, urine retention    Family History: Family History  Problem Relation Age of Onset   Diabetes Mother    Diabetes Maternal Uncle    Diabetes Maternal Grandmother    Heart disease Maternal Grandmother     Social History:  reports that he has never smoked. He has never used smokeless tobacco. He reports that he does not currently use alcohol. He reports that he does not currently use drugs after having used the following drugs: Marijuana.   Physical Exam: BP 101/69   Pulse 84  Ht 6\' 1"  (1.854 m)   Wt 230 lb (104.3 kg)   BMI 30.34 kg/m   Constitutional:  Alert and oriented, No acute distress. HEENT: [place] AT, moist mucus membranes.  Trachea midline, no masses. GU: Well healed scrotal incision. Softball sized hematocele vs reactive hydrocele. Testicles not palpable on the left; palpable on the right. Neurologic: Grossly intact, no focal deficits, moving all 4 extremities. Psychiatric: Normal mood and affect.  Assessment & Plan:    Right hydrocele vs. Hematocele  - Reports he has seen some improvement over the last few weeks.  -Plan for a recheck in about six weeks with a PA.  -He'll let us know prior to that time if it is continuing to improve or stable. If it's stable, plan for scrotal ultrasound prior to the follow-up appointment (if improving, will hold off) -continue supportive care  Return in about 6 weeks (around 10/15/2022) for post op check.  I have reviewed the above documentation for accuracy and completeness, and I agree with the above.   Vanna Scotland, MD   [facility] [address], [address] [place], [place] [address] [phone]

## 2022-09-05 ENCOUNTER — Ambulatory Visit: Payer: 59

## 2022-09-05 ENCOUNTER — Ambulatory Visit: Payer: Medicare Other

## 2022-09-09 DIAGNOSIS — G8320 Monoplegia of upper limb affecting unspecified side: Secondary | ICD-10-CM | POA: Diagnosis not present

## 2022-09-10 ENCOUNTER — Ambulatory Visit: Payer: Medicare Other

## 2022-09-10 ENCOUNTER — Ambulatory Visit: Payer: 59

## 2022-09-12 ENCOUNTER — Ambulatory Visit: Payer: Medicare Other

## 2022-09-12 ENCOUNTER — Ambulatory Visit: Payer: 59

## 2022-09-17 ENCOUNTER — Ambulatory Visit: Payer: Medicare Other

## 2022-09-17 ENCOUNTER — Ambulatory Visit: Payer: 59

## 2022-09-18 ENCOUNTER — Encounter: Payer: 59 | Attending: Physical Medicine & Rehabilitation | Admitting: Physical Medicine & Rehabilitation

## 2022-09-18 ENCOUNTER — Encounter: Payer: Self-pay | Admitting: Physical Medicine & Rehabilitation

## 2022-09-18 VITALS — BP 120/77 | HR 95 | Ht 73.0 in | Wt 224.0 lb

## 2022-09-18 DIAGNOSIS — G8111 Spastic hemiplegia affecting right dominant side: Secondary | ICD-10-CM | POA: Diagnosis not present

## 2022-09-18 NOTE — Progress Notes (Signed)
Subjective:    Patient ID: David Reeves, male    DOB: 1970-05-04, 52 y.o.   MRN: [id number]  HPI  David Reeves is back regarding his right spastic hemiparesis. He qualified for vivistim and is pending placement. He has had nice results with botox injections and the recent increase in tizanidine. He is still tight in his pronators to an extent as well as the wrist slightly.  His tizanidine dose is 6mg  qid  He had recent spermatocele excision by urology for a growing lesion. He is now doing well after that. He reports it was pretty painful initially.     Pain Inventory Average Pain 3 Pain Right Now 3 My pain is constant and burning  LOCATION OF PAIN  back  BOWEL Number of stools per week: 3-4  BLADDER Normal  Mobility walk without assistance how many minutes can you walk? 25-30 ability to climb steps?  yes do you drive?  yes  Function disabled: date disabled . I need assistance with the following:  dressing  Neuro/Psych spasms  Prior Studies Any changes since last visit?  no  Physicians involved in your care Any changes since last visit?  no   Family History  Problem Relation Age of Onset   Diabetes Mother    Diabetes Maternal Uncle    Diabetes Maternal Grandmother    Heart disease Maternal Grandmother    Social History   Socioeconomic History   Marital status: Single    Spouse name: mikki    Number of children: 1   Years of education: Not on file   Highest education level: Some college, no degree  Occupational History   Not on file  Tobacco Use   Smoking status: Never   Smokeless tobacco: Never   Tobacco comments:    cbd    Vaping Use   Vaping Use: Never used  Substance and Sexual Activity   Alcohol use: Not Currently    Comment: last used 12-22-17   Drug use: Not Currently    Types: Marijuana    Comment: last used in 2023   Sexual activity: Not Currently  Other Topics Concern   Not on file  Social History Narrative   Not on file   Social  Determinants of Health   Financial Resource Strain: Low Risk  (07/30/2022)   Overall Financial Resource Strain (CARDIA)    Difficulty of Paying Living Expenses: Not hard at all  Food Insecurity: No Food Insecurity (07/30/2022)   Hunger Vital Sign    Worried About Running Out of Food in the Last Year: Never true    Ran Out of Food in the Last Year: Never true  Transportation Needs: No Transportation Needs (07/30/2022)   PRAPARE - Administrator, Civil Service (Medical): No    Lack of Transportation (Non-Medical): No  Physical Activity: Insufficiently Active (07/30/2022)   Exercise Vital Sign    Days of Exercise per Week: 4 days    Minutes of Exercise per Session: 20 min  Stress: No Stress Concern Present (07/30/2022)   [employer] of Occupational Health - Occupational Stress Questionnaire    Feeling of Stress : Not at all  Social Connections: Socially Isolated (07/30/2022)   Social Connection and Isolation Panel [NHANES]    Frequency of Communication with Friends and Family: More than three times a week    Frequency of Social Gatherings with Friends and Family: Once a week    Attends Religious Services: Never    Production manager of  Clubs or Organizations: No    Attends Banker Meetings: Never    Marital Status: Divorced   Past Surgical History:  Procedure Laterality Date   CYSTECTOMY     head   HERNIA REPAIR Bilateral [phone]   [facility]   SPERMATOCELECTOMY Left 08/05/2022   Procedure: SPERMATOCELECTOMY;  Surgeon: Vanna Scotland, MD;  Location: [facility] ORS;  Service: Urology;  Laterality: Left;   TOE SURGERY Right 2007   Past Medical History:  Diagnosis Date   Anxiety    Aortic atherosclerosis (HCC)    Aphasia as late effect of cerebrovascular accident (CVA)    Back pain 04/22/2012   Bone spur    Bulging disc 04/22/2012   Chronic pain syndrome    a.) on buprenorphrine   Cognitive dysfunction    Degenerative disc disease    Erectile dysfunction    GERD  (gastroesophageal reflux disease)    Hemorrhoids    History of substance use    a.) THC + cocaine + ETOH   HLD (hyperlipidemia)    HTN (hypertension)    Hydrocele, bilateral    Insomnia    Ischemic cerebrovascular accident (CVA) (HCC) 11/08/2020   a.) MRI brain 11/08/2020 --> extensive hypodensities and loss of gray-white differentiation in the left ACA and left MCA territories with slight hyperdense left MCA and likely hyperdense left distal ICA; etiology felt to be secondary to cocaine use   Lumbar stenosis with neurogenic claudication    Osteoarthritis    Panic anxiety syndrome    Panic disorder    Right inguinal hernia    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    a.) on BZO (diazepam) PRN   Spermatocele (LEFT)    Taking multiple medications for chronic disease    BP 120/77   Pulse 95   Ht 6\' 1"  (1.854 m)   Wt 224 lb (101.6 kg)   SpO2 94%   BMI 29.55 kg/m   Opioid Risk Score:   Fall Risk Score:  `1  Depression screen [facility] 2/9     09/18/2022    9:15 AM 06/12/2022    9:25 AM 05/29/2022   10:33 AM 05/01/2022   11:19 AM 12/27/2021    4:06 PM 05/03/2021    3:06 PM 02/12/2021   11:35 AM  Depression screen PHQ 2/9  Decreased Interest 0 0 0 0 0 2 0  Down, Depressed, Hopeless 0 0 0 0 0 0 1  PHQ - 2 Score 0 0 0 0 0 2 1  Altered sleeping  0 1 0 2 2   Tired, decreased energy  0 0 0 0 2   Change in appetite  0 0 0 2 0   Feeling bad or failure about yourself   0 0 0 0 0   Trouble concentrating  0 0 0 0 2   Moving slowly or fidgety/restless  0 0 0 0 3   Suicidal thoughts  0 0 0 0 0   PHQ-9 Score  0 1 0 4 11   Difficult doing work/chores   Not difficult at all Not difficult at all Somewhat difficult Somewhat difficult      Review of Systems  Constitutional: Negative.   HENT: Negative.    Eyes: Negative.   Respiratory: Negative.    Cardiovascular: Negative.   Gastrointestinal: Negative.   Endocrine: Negative.   Genitourinary: Negative.   Musculoskeletal:  Positive for back pain.        Spasms   Skin: Negative.   Allergic/Immunologic: Negative.  Neurological: Negative.   Hematological: Negative.   Psychiatric/Behavioral: Negative.    All other systems reviewed and are negative.      Objective:   Physical Exam  General: No acute distress HEENT: NCAT, EOMI, oral membranes moist Cards: reg rate  Chest: normal effort Abdomen: Soft, NT, ND Skin: dry, intact Extremities: no edema Psych: pleasant and appropriate  Skin: intact Neuro: Patient is alert and oriented x 3.  Demonstrates mild facial droop on the right and dysarthria but speech is clearly intelligible.  Cognitively he demonstrates good insight and awareness and functional memory.  Right upper extremity is 3 - to 3 out of 5 with shoulder abduction 4 to 4+ out of 5 with elbow flexion, 4 out of 5 elbow extension.  Wrist and hand is 2-3 out of 5.  Right lower extremity grossly 4 to 4+ out of 5 throughout.  Sensory 1 out of 2 right arm and leg.  Tone is trace at pectoralis muscles, tr out of 4 biceps and brachioradialis 1 out of 4 pronator teres plus and perhaps pronator quadratus. Tr to 1/4 finger flexors.  Deep tendon reflexes are 3+ in the right upper extremity and 2+ right lower extremity.  Patient ambulated with reasonable swing of the right leg and heel strike. Musculoskeletal: Digits of right hand slightly tight in extension.         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  Right upper extremity is most affected. 2.  Chronic low back pain followed by pain clinic     Plan: Continue tizanidine @ 6 mg 4 times daily.   This has helped his general tone.  Will repeat Botox injections to the pronator teres/pronator quadratus and perhaps finger flexors, this time only 200 units Vivistim placement pending per NS.Therapy to follow     Patient will follow up with me in about a month . 20 minutes  of face to face patient care time were spent during this visit. All questions were encouraged and answered.  Marland Kitchen

## 2022-09-18 NOTE — Patient Instructions (Signed)
ALWAYS FEEL FREE TO CALL OUR OFFICE WITH ANY PROBLEMS OR QUESTIONS ([phone])  **PLEASE NOTE** ALL MEDICATION REFILL REQUESTS (INCLUDING CONTROLLED SUBSTANCES) NEED TO BE MADE AT LEAST 7 DAYS PRIOR TO REFILL BEING DUE. ANY REFILL REQUESTS INSIDE THAT TIME FRAME MAY RESULT IN DELAYS IN RECEIVING YOUR PRESCRIPTION.                    

## 2022-09-19 ENCOUNTER — Ambulatory Visit: Payer: Medicare Other

## 2022-09-19 ENCOUNTER — Ambulatory Visit: Payer: 59

## 2022-09-24 ENCOUNTER — Ambulatory Visit: Payer: Medicare Other

## 2022-09-24 ENCOUNTER — Ambulatory Visit: Payer: 59

## 2022-09-26 ENCOUNTER — Ambulatory Visit: Payer: 59

## 2022-09-26 ENCOUNTER — Ambulatory Visit: Payer: Medicare Other

## 2022-10-01 ENCOUNTER — Ambulatory Visit: Payer: Medicare Other

## 2022-10-01 ENCOUNTER — Ambulatory Visit: Payer: 59

## 2022-10-03 ENCOUNTER — Ambulatory Visit: Payer: Medicare Other

## 2022-10-03 ENCOUNTER — Ambulatory Visit: Payer: 59

## 2022-10-03 DIAGNOSIS — Z8673 Personal history of transient ischemic attack (TIA), and cerebral infarction without residual deficits: Secondary | ICD-10-CM | POA: Diagnosis not present

## 2022-10-07 ENCOUNTER — Encounter: Payer: Self-pay | Admitting: Urology

## 2022-10-07 NOTE — Progress Notes (Unsigned)
10/08/2022 1:25 PM   David Reeves 1970/09/21 [id number]  Referring provider: Alfredia Ferguson, PA-C [address] [place],  [place] [address]  Urological history: 1. Spermatocele -Left spermatocelectomy (07/2022)   Chief Complaint  Patient presents with   Other    HPI: David Reeves is a 52 y.o. male who presents today for recheck.   Previous records reviewed.   He underwent a left spermatocele ectomy on August 05, 2022.  On Sep 03, 2022 he saw Dr. Apolinar Junes with a complaint of continued left-sided swelling.  Exam suspicious for a spermatocele versus reactive hydrocele.  He presents today for follow-up.  He has not noted any changes in his left hemiscrotum since his last visit with [place].  He denies any pain or discharge from the incision.  Patient denies any modifying or aggravating factors.  Patient denies any recent UTI's, gross hematuria, dysuria or suprapubic/flank pain.  Patient denies any fevers, chills, nausea or vomiting.   PMH: Past Medical History:  Diagnosis Date   Anxiety    Aortic atherosclerosis (HCC)    Aphasia as late effect of cerebrovascular accident (CVA)    Back pain 04/22/2012   Bone spur    Bulging disc 04/22/2012   Chronic pain syndrome    a.) on buprenorphrine   Cognitive dysfunction    Degenerative disc disease    Erectile dysfunction    GERD (gastroesophageal reflux disease)    Hemorrhoids    History of substance use    a.) THC + cocaine + ETOH   HLD (hyperlipidemia)    HTN (hypertension)    Hydrocele, bilateral    Insomnia    Ischemic cerebrovascular accident (CVA) (HCC) 11/08/2020   a.) MRI brain 11/08/2020 --> extensive hypodensities and loss of gray-white differentiation in the left ACA and left MCA territories with slight hyperdense left MCA and likely hyperdense left distal ICA; etiology felt to be secondary to cocaine use   Lumbar stenosis with neurogenic claudication    Osteoarthritis    Panic anxiety syndrome     Panic disorder    Right inguinal hernia    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    a.) on BZO (diazepam) PRN   Spermatocele (LEFT)    Taking multiple medications for chronic disease     Surgical History: Past Surgical History:  Procedure Laterality Date   CYSTECTOMY     head   HERNIA REPAIR Bilateral 2006,2014   [facility]   SPERMATOCELECTOMY Left 08/05/2022   Procedure: SPERMATOCELECTOMY;  Surgeon: Vanna Scotland, MD;  Location: [facility] ORS;  Service: Urology;  Laterality: Left;   TOE SURGERY Right 2007    Home Medications:  Allergies as of 10/08/2022       Reactions   Duloxetine    Other reaction(s): Other (See Comments) Increased temperature, sweating, uncontrolled shaking, aggressive thoughts   Amitriptyline Other (See Comments)   Urine retention Other Reaction(s): Urine retention   Buspirone Other (See Comments)   Kidney pain Other Reaction(s): Kidney pain   Ciprofloxacin Other (See Comments)   hallucinations   Doxepin Other (See Comments)   "Didn't feel right" Other Reaction(s): Stomach ache   Escitalopram Other (See Comments)   Agitation   Olanzapine    Penicillins Other (See Comments)   Unknown -- childhood reaction   Sertraline Other (See Comments)   Urine retention   Trazodone Other (See Comments)   abd pain, urine retention        Medication List  Accurate as of October 08, 2022  1:25 PM. If you have any questions, ask your nurse or doctor.          acetaminophen 500 MG tablet Commonly known as: TYLENOL Take 1,000 mg by mouth every 6 (six) hours as needed for mild pain.   amLODipine 5 MG tablet Commonly known as: NORVASC TAKE 1 TABLET (5 MG TOTAL) BY MOUTH DAILY. What changed: when to take this   aspirin EC 81 MG tablet Take 81 mg by mouth daily. Swallow whole.   atorvastatin 80 MG tablet Commonly known as: LIPITOR TAKE 1 TABLET BY MOUTH EVERY DAY What changed: when to take this   buprenorphine 15 MCG/HR Commonly known as: BUTRANS 1 patch  once a week. Thursdays   calcium carbonate 500 MG chewable tablet Commonly known as: TUMS - dosed in mg elemental calcium Chew 1 tablet by mouth as needed for indigestion or heartburn.   ALKA-SELTZER ANTACID PO Take 1 tablet by mouth as needed.   celecoxib 100 MG capsule Commonly known as: CELEBREX TAKE 1 CAPSULE BY MOUTH TWICE  DAILY   diazepam 10 MG tablet Commonly known as: VALIUM Take 10 mg by mouth 2 (two) times daily.   donepezil 10 MG tablet Commonly known as: ARICEPT TAKE 1 TABLET BY MOUTH AT  BEDTIME   FISH OIL PO Take 1 tablet by mouth daily at 6 (six) AM.   gabapentin 400 MG capsule Commonly known as: NEURONTIN TAKE 1 CAPSULE BY MOUTH 3 TIMES  DAILY   lidocaine 5 % Commonly known as: LIDODERM 1 patch daily. Lower back and upper shoulder   multivitamin tablet Take 1 tablet by mouth daily.   prazosin 1 MG capsule Commonly known as: MINIPRESS TAKE 2 CAPSULES BY MOUTH AT  BEDTIME , DO NOT TAKE WITH  AMBIEN What changed: See the new instructions.   tiZANidine 4 MG tablet Commonly known as: ZANAFLEX TAKE 1 AND 1/2 TABLETS(6 MG) BY MOUTH EVERY 6 HOURS AS NEEDED FOR MUSCLE SPASMS   VITAMIN C PO Take 1 tablet by mouth daily at 6 (six) AM.   Vitamin D3 50 MCG (2000 UT) Tabs Take 1 tablet by mouth daily at 6 (six) AM.        Allergies:  Allergies  Allergen Reactions   Duloxetine     Other reaction(s): Other (See Comments) Increased temperature, sweating, uncontrolled shaking, aggressive thoughts   Amitriptyline Other (See Comments)    Urine retention  Other Reaction(s): Urine retention   Buspirone Other (See Comments)    Kidney pain  Other Reaction(s): Kidney pain   Ciprofloxacin Other (See Comments)    hallucinations   Doxepin Other (See Comments)    "Didn't feel right"  Other Reaction(s): Stomach ache   Escitalopram Other (See Comments)    Agitation   Olanzapine    Penicillins Other (See Comments)    Unknown -- childhood reaction    Sertraline Other (See Comments)    Urine retention   Trazodone Other (See Comments)    abd pain, urine retention    Family History: Family History  Problem Relation Age of Onset   Diabetes Mother    Diabetes Maternal Uncle    Diabetes Maternal Grandmother    Heart disease Maternal Grandmother     Social History:  reports that he has never smoked. He has never used smokeless tobacco. He reports that he does not currently use alcohol. He reports that he does not currently use drugs after having used the following drugs: Marijuana.  ROS: Pertinent ROS in HPI  Physical Exam: BP 120/80   Pulse 78   Ht 6\' 1"  (1.854 m)   Wt 222 lb (100.7 kg)   BMI 29.29 kg/m   Constitutional:  Well nourished. Alert and oriented, No acute distress. HEENT: [place] AT, moist mucus membranes.  Trachea midline Cardiovascular: No clubbing, cyanosis, or edema. Respiratory: Normal respiratory effort, no increased work of breathing. GU: Well healed scrotal incision.  Scrotum without lesions, cysts, rashes and/or edema.  Right testicle is located scrotally bilaterally.  No masses are appreciated in the right testicle. Right epididymis is normal.  Left hemiscrotum still a softball sized firm sac.   Neurologic: Grossly intact, no focal deficits, moving all 4 extremities. Psychiatric: Normal mood and affect.  Laboratory Data: Lab Results  Component Value Date   WBC 5.3 08/01/2022   HGB 14.2 08/01/2022   HCT 41.2 08/01/2022   MCV 92.8 08/01/2022   PLT 307 08/01/2022  I have reviewed the labs.   Pertinent Imaging: N/A  Assessment & Plan:    1. Left spermatocele -Had no change in the scrotal swelling since last visit, so we will go ahead and order a scrotal ultrasound to further evaluate scrotal contents  Return for scrotal ultrasound report with Dr. Apolinar Junes .  These notes generated with voice recognition software. I apologize for typographical errors.  Cloretta Ned  [facility] [address]  [address] [place], [place] [address] [phone]

## 2022-10-08 ENCOUNTER — Ambulatory Visit (INDEPENDENT_AMBULATORY_CARE_PROVIDER_SITE_OTHER): Payer: 59 | Admitting: Urology

## 2022-10-08 ENCOUNTER — Encounter: Payer: Self-pay | Admitting: Urology

## 2022-10-08 ENCOUNTER — Ambulatory Visit: Payer: Medicare Other

## 2022-10-08 ENCOUNTER — Ambulatory Visit: Payer: 59

## 2022-10-08 VITALS — BP 120/80 | HR 78 | Ht 73.0 in | Wt 222.0 lb

## 2022-10-08 DIAGNOSIS — N432 Other hydrocele: Secondary | ICD-10-CM

## 2022-10-08 DIAGNOSIS — N4340 Spermatocele of epididymis, unspecified: Secondary | ICD-10-CM

## 2022-10-10 ENCOUNTER — Ambulatory Visit: Payer: 59

## 2022-10-10 ENCOUNTER — Ambulatory Visit: Payer: Medicare Other

## 2022-10-15 ENCOUNTER — Ambulatory Visit: Payer: Medicare Other

## 2022-10-16 DIAGNOSIS — Z79891 Long term (current) use of opiate analgesic: Secondary | ICD-10-CM | POA: Diagnosis not present

## 2022-10-16 DIAGNOSIS — G8921 Chronic pain due to trauma: Secondary | ICD-10-CM | POA: Diagnosis not present

## 2022-10-16 DIAGNOSIS — I69351 Hemiplegia and hemiparesis following cerebral infarction affecting right dominant side: Secondary | ICD-10-CM | POA: Diagnosis not present

## 2022-10-16 DIAGNOSIS — G894 Chronic pain syndrome: Secondary | ICD-10-CM | POA: Diagnosis not present

## 2022-10-16 DIAGNOSIS — I10 Essential (primary) hypertension: Secondary | ICD-10-CM | POA: Diagnosis not present

## 2022-10-16 DIAGNOSIS — I69322 Dysarthria following cerebral infarction: Secondary | ICD-10-CM | POA: Diagnosis not present

## 2022-10-16 DIAGNOSIS — M791 Myalgia, unspecified site: Secondary | ICD-10-CM | POA: Diagnosis not present

## 2022-10-17 ENCOUNTER — Ambulatory Visit: Payer: Medicare Other

## 2022-10-22 ENCOUNTER — Ambulatory Visit: Payer: Medicare Other

## 2022-10-22 ENCOUNTER — Ambulatory Visit
Admission: RE | Admit: 2022-10-22 | Discharge: 2022-10-22 | Disposition: A | Discharge status: Home / Self Care | Payer: 59 | Source: Ambulatory Visit | Attending: Urology | Admitting: Urology

## 2022-10-22 DIAGNOSIS — N4340 Spermatocele of epididymis, unspecified: Secondary | ICD-10-CM | POA: Insufficient documentation

## 2022-10-22 DIAGNOSIS — N432 Other hydrocele: Secondary | ICD-10-CM | POA: Diagnosis present

## 2022-10-22 DIAGNOSIS — M799 Soft tissue disorder, unspecified: Secondary | ICD-10-CM | POA: Diagnosis not present

## 2022-10-23 ENCOUNTER — Encounter: Payer: Self-pay | Admitting: Physical Medicine & Rehabilitation

## 2022-10-23 ENCOUNTER — Encounter: Payer: 59 | Attending: Physical Medicine & Rehabilitation | Admitting: Physical Medicine & Rehabilitation

## 2022-10-23 VITALS — BP 111/73 | HR 90 | Ht 73.0 in | Wt 219.0 lb

## 2022-10-23 DIAGNOSIS — G8111 Spastic hemiplegia affecting right dominant side: Secondary | ICD-10-CM | POA: Diagnosis not present

## 2022-10-23 MED ORDER — ONABOTULINUMTOXINA 100 UNITS IJ SOLR
200.0000 [IU] | Freq: Once | INTRAMUSCULAR | Status: AC
Start: 2022-10-23 — End: 2022-10-23
  Administered 2022-10-23: 200 [IU] via INTRAMUSCULAR

## 2022-10-23 NOTE — Addendum Note (Signed)
Addended by: Silas Sacramento T on: 10/23/2022 01:36 PM   Modules accepted: Orders

## 2022-10-23 NOTE — Progress Notes (Signed)
Botox Injection for spasticity of upper extremity using needle EMG guidance Indication: Spastic hemiparesis of right dominant side (HCC)   Dilution: 100 Units/ml        Total Units Injected: 200 Indication: Severe spasticity which interferes with ADL,mobility and/or  hygiene and is unresponsive to medication management and other conservative care Informed consent was obtained after describing risks and benefits of the procedure with the patient. This includes bleeding, bruising, infection, excessive weakness, or medication side effects. A REMS form is on file and signed.  Needle: 50mm injectable monopolar needle electrode    Number of units per muscle Pectoralis Major 0 units Pectoralis Minor 0 units Biceps 0 units Brachioradialis 0 units FCR 0 units FCU 0 units FDS 50 units FDP 50 units FPL 0 units Palmaris Longus 0 units Pronator Teres 75 units Pronator Quadratus 25 units Lumbricals 0 units All injections were done after obtaining appropriate EMG activity and after negative drawback for blood. The patient tolerated the procedure well. Post procedure instructions were given. Return in about 3 months (around 01/23/2023).

## 2022-10-23 NOTE — Patient Instructions (Signed)
ALWAYS FEEL FREE TO CALL OUR OFFICE WITH ANY PROBLEMS OR QUESTIONS ([phone])  **PLEASE NOTE** ALL MEDICATION REFILL REQUESTS (INCLUDING CONTROLLED SUBSTANCES) NEED TO BE MADE AT LEAST 7 DAYS PRIOR TO REFILL BEING DUE. ANY REFILL REQUESTS INSIDE THAT TIME FRAME MAY RESULT IN DELAYS IN RECEIVING YOUR PRESCRIPTION.                    

## 2022-10-29 ENCOUNTER — Ambulatory Visit: Payer: Medicare Other

## 2022-10-31 ENCOUNTER — Ambulatory Visit: Payer: Medicare Other

## 2022-11-02 DIAGNOSIS — Z8673 Personal history of transient ischemic attack (TIA), and cerebral infarction without residual deficits: Secondary | ICD-10-CM | POA: Diagnosis not present

## 2022-11-05 ENCOUNTER — Ambulatory Visit: Payer: Medicare Other

## 2022-11-07 ENCOUNTER — Ambulatory Visit: Payer: Medicare Other

## 2022-11-12 ENCOUNTER — Ambulatory Visit: Payer: Medicare Other

## 2022-11-13 ENCOUNTER — Ambulatory Visit (INDEPENDENT_AMBULATORY_CARE_PROVIDER_SITE_OTHER): Payer: 59 | Admitting: Urology

## 2022-11-13 VITALS — BP 118/75 | HR 72 | Ht 73.0 in | Wt 219.0 lb

## 2022-11-13 DIAGNOSIS — N4340 Spermatocele of epididymis, unspecified: Secondary | ICD-10-CM

## 2022-11-13 NOTE — Progress Notes (Signed)
Marcelle Overlie Plume,acting as a scribe for Vanna Scotland, MD.,have documented all relevant documentation on the behalf of Vanna Scotland, MD,as directed by  Vanna Scotland, MD while in the presence of Vanna Scotland, MD.  11/13/2022 12:25 PM   David Reeves 1970-06-24 [id number]  Referring provider: Alfredia Ferguson, PA-C [address] [place],  [place] [address]  Chief Complaint  Patient presents with   Follow-up    scrotal ultrasound results     HPI:  52 year-old male who has a personal history of chronic left spermatocele and underwent a spermatocelectomy with an 8 cm spermatocele.   He returned at his post-op visit a month later and still had a significant amount of swelling as if he had not had surgery. At that time, he had a softball sized hematoma versus reactive hydrocele. He was advised for supportive care.  He returns with a scrotal ultrasound that showed a multiloculated cystic mass arising from the left epididymal head likely a recurrent spermatocele based on the location that measures 4.1 x 2.8 x 3.7. There is also a moderate right-sided hydrocele.   Today, he reports significant reduction in scrotal swelling. No pain or discomfort currently.   PMH: Past Medical History:  Diagnosis Date   Anxiety    Aortic atherosclerosis (HCC)    Aphasia as late effect of cerebrovascular accident (CVA)    Back pain 04/22/2012   Bone spur    Bulging disc 04/22/2012   Chronic pain syndrome    a.) on buprenorphrine   Cognitive dysfunction    Degenerative disc disease    Erectile dysfunction    GERD (gastroesophageal reflux disease)    Hemorrhoids    History of substance use    a.) THC + cocaine + ETOH   HLD (hyperlipidemia)    HTN (hypertension)    Hydrocele, bilateral    Insomnia    Ischemic cerebrovascular accident (CVA) (HCC) 11/08/2020   a.) MRI brain 11/08/2020 --> extensive hypodensities and loss of gray-white differentiation in the left ACA and left  MCA territories with slight hyperdense left MCA and likely hyperdense left distal ICA; etiology felt to be secondary to cocaine use   Lumbar stenosis with neurogenic claudication    Osteoarthritis    Panic anxiety syndrome    Panic disorder    Right inguinal hernia    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    a.) on BZO (diazepam) PRN   Spermatocele (LEFT)    Taking multiple medications for chronic disease     Surgical History: Past Surgical History:  Procedure Laterality Date   CYSTECTOMY     head   HERNIA REPAIR Bilateral 2006,2014   [facility]   SPERMATOCELECTOMY Left 08/05/2022   Procedure: SPERMATOCELECTOMY;  Surgeon: Vanna Scotland, MD;  Location: [facility] ORS;  Service: Urology;  Laterality: Left;   TOE SURGERY Right 2007    Home Medications:  Allergies as of 11/13/2022       Reactions   Duloxetine    Other reaction(s): Other (See Comments) Increased temperature, sweating, uncontrolled shaking, aggressive thoughts   Amitriptyline Other (See Comments)   Urine retention Other Reaction(s): Urine retention   Buspirone Other (See Comments)   Kidney pain Other Reaction(s): Kidney pain   Ciprofloxacin Other (See Comments)   hallucinations   Doxepin Other (See Comments)   "Didn't feel right" Other Reaction(s): Stomach ache   Escitalopram Other (See Comments)   Agitation   Olanzapine    Penicillins Other (See Comments)   Unknown -- childhood  reaction   Sertraline Other (See Comments)   Urine retention   Trazodone Other (See Comments)   abd pain, urine retention        Medication List        Accurate as of November 13, 2022 12:25 PM. If you have any questions, ask your nurse or doctor.          acetaminophen 500 MG tablet Commonly known as: TYLENOL Take 1,000 mg by mouth every 6 (six) hours as needed for mild pain.   amLODipine 5 MG tablet Commonly known as: NORVASC TAKE 1 TABLET (5 MG TOTAL) BY MOUTH DAILY. What changed: when to take this   aspirin EC 81 MG tablet Take  81 mg by mouth daily. Swallow whole.   atorvastatin 80 MG tablet Commonly known as: LIPITOR TAKE 1 TABLET BY MOUTH EVERY DAY What changed: when to take this   buprenorphine 15 MCG/HR Commonly known as: BUTRANS 1 patch once a week. Thursdays   calcium carbonate 500 MG chewable tablet Commonly known as: TUMS - dosed in mg elemental calcium Chew 1 tablet by mouth as needed for indigestion or heartburn.   ALKA-SELTZER ANTACID PO Take 1 tablet by mouth as needed.   celecoxib 100 MG capsule Commonly known as: CELEBREX TAKE 1 CAPSULE BY MOUTH TWICE  DAILY   diazepam 10 MG tablet Commonly known as: VALIUM Take 10 mg by mouth 2 (two) times daily.   donepezil 10 MG tablet Commonly known as: ARICEPT TAKE 1 TABLET BY MOUTH AT  BEDTIME   FISH OIL PO Take 1 tablet by mouth daily at 6 (six) AM.   gabapentin 400 MG capsule Commonly known as: NEURONTIN TAKE 1 CAPSULE BY MOUTH 3 TIMES  DAILY   lidocaine 5 % Commonly known as: LIDODERM 1 patch daily. Lower back and upper shoulder   multivitamin tablet Take 1 tablet by mouth daily.   prazosin 1 MG capsule Commonly known as: MINIPRESS TAKE 2 CAPSULES BY MOUTH AT  BEDTIME , DO NOT TAKE WITH  AMBIEN What changed: See the new instructions.   tiZANidine 4 MG tablet Commonly known as: ZANAFLEX TAKE 1 AND 1/2 TABLETS(6 MG) BY MOUTH EVERY 6 HOURS AS NEEDED FOR MUSCLE SPASMS   VITAMIN C PO Take 1 tablet by mouth daily at 6 (six) AM.   Vitamin D3 50 MCG (2000 UT) Tabs Take 1 tablet by mouth daily at 6 (six) AM.        Allergies:  Allergies  Allergen Reactions   Duloxetine     Other reaction(s): Other (See Comments) Increased temperature, sweating, uncontrolled shaking, aggressive thoughts   Amitriptyline Other (See Comments)    Urine retention  Other Reaction(s): Urine retention   Buspirone Other (See Comments)    Kidney pain  Other Reaction(s): Kidney pain   Ciprofloxacin Other (See Comments)    hallucinations    Doxepin Other (See Comments)    "Didn't feel right"  Other Reaction(s): Stomach ache   Escitalopram Other (See Comments)    Agitation   Olanzapine    Penicillins Other (See Comments)    Unknown -- childhood reaction   Sertraline Other (See Comments)    Urine retention   Trazodone Other (See Comments)    abd pain, urine retention    Family History: Family History  Problem Relation Age of Onset   Diabetes Mother    Diabetes Maternal Uncle    Diabetes Maternal Grandmother    Heart disease Maternal Grandmother     Social History:  reports that he has never smoked. He has never used smokeless tobacco. He reports that he does not currently use alcohol. He reports that he does not currently use drugs after having used the following drugs: Marijuana.   Physical Exam: BP 118/75   Pulse 72   Ht 6\' 1"  (1.854 m)   Wt 219 lb (99.3 kg)   BMI 28.89 kg/m   Constitutional:  Alert and oriented, No acute distress. HEENT: [place] AT, moist mucus membranes.  Trachea midline, no masses. GU: Dramatic improvement in his scrotal exam and size. His left testicle is now palpable. There is a 3 cm fluid filled sac just superior to this. His right testicle is normal and palpable, clinically improved. Neurologic: Grossly intact, no focal deficits, moving all 4 extremities. Psychiatric: Normal mood and affect.  Pertinent Imaging:  EXAM: SCROTAL ULTRASOUND   DOPPLER ULTRASOUND OF THE TESTICLES   TECHNIQUE: Complete ultrasound examination of the testicles, epididymis, and other scrotal structures was performed. Color and spectral Doppler ultrasound were also utilized to evaluate blood flow to the testicles.   COMPARISON:  Scrotal ultrasound dated 01/25/2021.   FINDINGS: Right testicle   Measurements: 4.1 x 2.3 x 3.3 cm. No mass or microlithiasis visualized.   Left testicle   Measurements: 3.5 x 2.2 x 2.9 cm. No mass or microlithiasis visualized.   Right epididymis: An epididymal head cyst  measures 1.1 x 1.0 x 0.8 cm.   Left epididymis: A multiloculated cystic mass appears to arise from the left epididymal head and measures 4.1 x 2.8 x 3.7 cm.   Hydrocele:  There is a moderate right hydrocele.   Varicocele:  None visualized.   Pulsed Doppler interrogation of both testes demonstrates normal low resistance arterial and venous waveforms bilaterally.   Other: There is skin thickening overlying the scrotum, measuring 8 mm.   IMPRESSION: 1. Multiloculated cystic mass appears to arise from the left epididymal head and may represent a spermatocele or hematoma. 2. Moderate right hydrocele. 3. Skin thickening overlying the scrotum.     Electronically Signed   By: Romona Curls M.D.   On: 10/22/2022 15:09  This was personally reviewed and I agree with the radiologic interpretation.   Assessment & Plan:    1. Left spermatocele - Overall, he is satisfied at this point in time - I recommend conservative management for the time being, unless his scrotal exams worsen significantly or becomes bothered. - He is agreeable to this plan - Supportive care  Return if symptoms worsen or fail to improve.  I have reviewed the above documentation for accuracy and completeness, and I agree with the above.   Vanna Scotland, MD   [facility] [address], [address] [place], [place] [address] [phone]

## 2022-11-14 ENCOUNTER — Ambulatory Visit: Payer: Medicare Other

## 2022-11-20 ENCOUNTER — Emergency Department
Admission: EM | Admit: 2022-11-20 | Discharge: 2022-11-20 | Disposition: A | Discharge status: Home / Self Care | Payer: 59 | Attending: Emergency Medicine | Admitting: Emergency Medicine

## 2022-11-20 ENCOUNTER — Other Ambulatory Visit: Payer: Self-pay

## 2022-11-20 ENCOUNTER — Emergency Department: Payer: 59

## 2022-11-20 DIAGNOSIS — I63322 Cerebral infarction due to thrombosis of left anterior cerebral artery: Secondary | ICD-10-CM | POA: Diagnosis not present

## 2022-11-20 DIAGNOSIS — I10 Essential (primary) hypertension: Secondary | ICD-10-CM | POA: Diagnosis not present

## 2022-11-20 DIAGNOSIS — R410 Disorientation, unspecified: Secondary | ICD-10-CM | POA: Diagnosis not present

## 2022-11-20 DIAGNOSIS — R569 Unspecified convulsions: Secondary | ICD-10-CM | POA: Insufficient documentation

## 2022-11-20 LAB — COMPREHENSIVE METABOLIC PANEL
ALT: 36 U/L (ref 0–44)
AST: 31 U/L (ref 15–41)
Albumin: 4.1 g/dL (ref 3.5–5.0)
Alkaline Phosphatase: 61 U/L (ref 38–126)
Anion gap: 9 (ref 5–15)
BUN: 14 mg/dL (ref 6–20)
CO2: 27 mmol/L (ref 22–32)
Calcium: 8.9 mg/dL (ref 8.9–10.3)
Chloride: 103 mmol/L (ref 98–111)
Creatinine, Ser: 0.79 mg/dL (ref 0.61–1.24)
GFR, Estimated: >60 mL/min (ref >60)
Glucose, Bld: 103 mg/dL — ABNORMAL HIGH (ref 70–99)
Potassium: 3.4 mmol/L — ABNORMAL LOW (ref 3.5–5.1)
Sodium: 139 mmol/L (ref 135–145)
Total Bilirubin: 0.6 mg/dL (ref 0.3–1.2)
Total Protein: 7 g/dL (ref 6.5–8.1)

## 2022-11-20 LAB — URINALYSIS, ROUTINE W REFLEX MICROSCOPIC
Bilirubin Urine: NEGATIVE
Glucose, UA: NEGATIVE mg/dL
Hgb urine dipstick: NEGATIVE
Ketones, ur: NEGATIVE mg/dL
Leukocytes,Ua: NEGATIVE
Nitrite: NEGATIVE
Protein, ur: NEGATIVE mg/dL
Specific Gravity, Urine: 1.02 (ref 1.005–1.030)
pH: 8 (ref 5.0–8.0)

## 2022-11-20 LAB — URINE DRUG SCREEN, QUALITATIVE (ARMC ONLY)
Amphetamines, Ur Screen: NOT DETECTED
Barbiturates, Ur Screen: NOT DETECTED
Benzodiazepine, Ur Scrn: POSITIVE — AB
Cannabinoid 50 Ng, Ur ~~LOC~~: POSITIVE — AB
Cocaine Metabolite,Ur ~~LOC~~: NOT DETECTED
MDMA (Ecstasy)Ur Screen: NOT DETECTED
Methadone Scn, Ur: NOT DETECTED
Opiate, Ur Screen: NOT DETECTED
Phencyclidine (PCP) Ur S: NOT DETECTED
Tricyclic, Ur Screen: POSITIVE — AB

## 2022-11-20 LAB — CBC
HCT: 36.6 % — ABNORMAL LOW (ref 39.0–52.0)
Hemoglobin: 13.2 g/dL (ref 13.0–17.0)
MCH: 32 pg (ref 26.0–34.0)
MCHC: 36.1 g/dL — ABNORMAL HIGH (ref 30.0–36.0)
MCV: 88.6 fL (ref 80.0–100.0)
Platelets: 271 10*3/uL (ref 150–400)
RBC: 4.13 MIL/uL — ABNORMAL LOW (ref 4.22–5.81)
RDW: 11.9 % (ref 11.5–15.5)
WBC: 4.7 10*3/uL (ref 4.0–10.5)
nRBC: 0 % (ref 0.0–0.2)

## 2022-11-20 LAB — ACETAMINOPHEN LEVEL: Acetaminophen (Tylenol), Serum: <10 ug/mL — ABNORMAL LOW (ref 10–30)

## 2022-11-20 LAB — ETHANOL: Alcohol, Ethyl (B): <10 mg/dL (ref <10)

## 2022-11-20 LAB — MAGNESIUM: Magnesium: 1.7 mg/dL (ref 1.7–2.4)

## 2022-11-20 LAB — SALICYLATE LEVEL: Salicylate Lvl: <7 mg/dL — ABNORMAL LOW (ref 7.0–30.0)

## 2022-11-20 MED ORDER — SODIUM CHLORIDE 0.9 % IV BOLUS
1000.0000 mL | Freq: Once | INTRAVENOUS | Status: AC
  Administered 2022-11-20: 1000 mL via INTRAVENOUS

## 2022-11-20 NOTE — Discharge Instructions (Addendum)
Your CT scan of the head and lab test today were all okay.  Continue taking your medications.  Please follow up with neurology for further evaluation of today's episode.

## 2022-11-20 NOTE — ED Provider Notes (Signed)
[facility] Provider Note    Event Date/Time   First MD Initiated Contact with Patient 11/20/22 0703     (approximate)   History   Chief Complaint: Seizures   HPI  David Reeves is a 52 y.o. male with a history of left MCA infarct with residual right-sided deficits, GERD, hypertension who comes ED due to seizure activity at home.  No history of seizures.  Denies fever chills or trauma.  No headache or neck pain.  Reports he was in his usual state of health, laying on his bed, trying to go to sleep when the first seizure happened.  Denies fall.  Reports that he has been suffering from insomnia and has been awake for a prolonged period of time.  EMS report additional seizure activity during transport, gave Haldol and Versed.  Currently patient denies any complaints.  Request food.     Physical Exam   Triage Vital Signs: ED Triage Vitals  Encounter Vitals Group     BP 11/20/22 0623 135/89     Systolic BP Percentile --      Diastolic BP Percentile --      Pulse Rate 11/20/22 0627 66     Resp 11/20/22 0627 16     Temp 11/20/22 0632 98.6 F (37 C)     Temp Source 11/20/22 0632 Oral     SpO2 11/20/22 0627 97 %     Weight 11/20/22 0632 215 lb 9.8 oz (97.8 kg)     Height 11/20/22 0632 6\' 1"  (1.854 m)     Head Circumference --      Peak Flow --      Pain Score 11/20/22 0632 0     Pain Loc --      Pain Education --      Exclude from Growth Chart --     Most recent vital signs: Vitals:   11/20/22 0701 11/20/22 0900  BP:  130/84  Pulse: 63 (!) 48  Resp: 15 11  Temp:    SpO2: 98% 97%    General: Awake, no distress.  CV:  Good peripheral perfusion.  Regular rate rhythm Resp:  Normal effort.  Clear to auscultation bilaterally Abd:  No distention.  Soft nontender Other:  No bony tenderness or deformities, no focal tenderness, extremities with full range of motion.  Strength at baseline per patient.   ED Results / Procedures / Treatments    Labs (all labs ordered are listed, but only abnormal results are displayed) Labs Reviewed  CBC - Abnormal; Notable for the following components:      Result Value   RBC 4.13 (*)    HCT 36.6 (*)    MCHC 36.1 (*)    All other components within normal limits  COMPREHENSIVE METABOLIC PANEL - Abnormal; Notable for the following components:   Potassium 3.4 (*)    Glucose, Bld 103 (*)    All other components within normal limits  ACETAMINOPHEN LEVEL - Abnormal; Notable for the following components:   Acetaminophen (Tylenol), Serum <10 (*)    All other components within normal limits  SALICYLATE LEVEL - Abnormal; Notable for the following components:   Salicylate Lvl <7.0 (*)    All other components within normal limits  URINE DRUG SCREEN, QUALITATIVE ([facility] ONLY) - Abnormal; Notable for the following components:   Tricyclic, Ur Screen POSITIVE (*)    Cannabinoid 50 Ng, Ur [place] POSITIVE (*)    Benzodiazepine, Ur Scrn POSITIVE (*)  All other components within normal limits  URINALYSIS, ROUTINE W REFLEX MICROSCOPIC - Abnormal; Notable for the following components:   Color, Urine YELLOW (*)    APPearance HAZY (*)    All other components within normal limits  ETHANOL  MAGNESIUM     EKG Interpreted by me Sinus rhythm rate of 63.  Left axis, normal intervals.  Poor R wave progression.  Normal ST segments and T waves.  No ischemic changes   RADIOLOGY Chest x-ray interpreted by me, negative for pneumonia pleural effusion or edema.  Radiology report reviewed.  CT head unremarkable.   PROCEDURES:  Procedures   MEDICATIONS ORDERED IN ED: Medications  sodium chloride 0.9 % bolus 1,000 mL (1,000 mLs Intravenous New Bag/Given 11/20/22 0817)     IMPRESSION / MDM / ASSESSMENT AND PLAN / ED COURSE  I reviewed the triage vital signs and the nursing notes.  DDx: Electrolyte abnormality, dehydration, AKI, anemia, UTI, intoxication, poststroke epilepsy, intracranial hemorrhage,  insomnia induced seizure  Patient's presentation is most consistent with acute presentation with potential threat to life or bodily function.  Patient presents with seizure activity at home.  No preceding symptoms, currently asymptomatic.  Exam unremarkable.  Vital signs normal.  Labs unremarkable.  Will give IV fluids for hydration, follow-up urinalysis.  ----------------------------------------- 10:59 AM on 11/20/2022 ----------------------------------------- Pt continues to feel well, denies any complaints. UA normal. Stable for DC. Seizure precautions given. F/u neuro        FINAL CLINICAL IMPRESSION(S) / ED DIAGNOSES   Final diagnoses:  Seizure-like activity (HCC)     Rx / DC Orders   ED Discharge Orders     None        Note:  This document was prepared using Dragon voice recognition software and may include unintentional dictation errors.   Sharman Cheek, MD 11/20/22 1059

## 2022-11-20 NOTE — ED Notes (Signed)
Pt back from CT, placed back on CCM. Pt remains AAOX4.

## 2022-11-20 NOTE — ED Notes (Signed)
Patient transported to CT 

## 2022-11-20 NOTE — ED Triage Notes (Addendum)
Pt BIB EMS for reported seizure like activity. Per EMS pt had 1 witnessed sz with family, 1 with firefighters, and another with EMS. No reported hx of sz's. CVA about 2 years ago with right sided deficits. Per EMS pt did was combative on scene and received haldol 5mg  IM on scene and a total of 10mg  of versed IM. Pt is calm and cooperative on arrival to ED. While placing pt on monitor, pt found to have 2 patches of buprenorphine on chest. Pt states he forgot to remove 1 and removed 1 in the ED. FSBG 104 IV R wrist 20G

## 2022-11-23 ENCOUNTER — Other Ambulatory Visit: Payer: Self-pay | Admitting: Physician Assistant

## 2022-11-23 DIAGNOSIS — F410 Panic disorder [episodic paroxysmal anxiety] without agoraphobia: Secondary | ICD-10-CM

## 2022-11-23 DIAGNOSIS — F09 Unspecified mental disorder due to known physiological condition: Secondary | ICD-10-CM

## 2022-11-23 DIAGNOSIS — G8929 Other chronic pain: Secondary | ICD-10-CM

## 2022-11-23 DIAGNOSIS — I6930 Unspecified sequelae of cerebral infarction: Secondary | ICD-10-CM

## 2022-11-23 DIAGNOSIS — G894 Chronic pain syndrome: Secondary | ICD-10-CM

## 2022-11-25 ENCOUNTER — Other Ambulatory Visit: Payer: Self-pay | Admitting: Physical Medicine & Rehabilitation

## 2022-11-25 DIAGNOSIS — R252 Cramp and spasm: Secondary | ICD-10-CM

## 2022-11-26 ENCOUNTER — Telehealth: Payer: Self-pay

## 2022-11-26 NOTE — Telephone Encounter (Signed)
Transition Care Management Follow-up Telephone Call Date of discharge and from where: 11/20/2022 [facility] How have you been since you were released from the hospital? Patient stated he is feeling fine. Any questions or concerns? No  Items Reviewed: Did the pt receive and understand the discharge instructions provided? Yes  Medications obtained and verified?  No medication prescribed. Other? No  Any new allergies since your discharge? No  Dietary orders reviewed? Yes Do you have support at home? Yes   Follow up appointments reviewed:  PCP Hospital f/u appt confirmed? Yes  Scheduled to see Jacquenette Shone, DO on 12/10/2022 @ [facility]. Specialist Hospital f/u appt confirmed? Yes  Scheduled to see Jolene Provost, MD on 01/30/2023 @ [facility]. Are transportation arrangements needed? No  If their condition worsens, is the pt aware to call PCP or go to the Emergency Dept.? Yes Was the patient provided with contact information for the PCP's office or ED? Yes Was to pt encouraged to call back with questions or concerns? Yes  Manvir Prabhu [facility]  [facility] Community Resource Care Guide   [email or web address]  ?? [phone]   Website: [email or web address]  [email or web address]

## 2022-11-26 NOTE — Progress Notes (Deleted)
Established patient visit  Patient: David Reeves   DOB: 1970-11-07   52 y.o. Male  MRN: [id number] Visit Date: 11/27/2022  Today's healthcare provider: Debera Lat, PA-C   No chief complaint on file.  Subjective    HPI  *** Discussed the use of AI scribe software for clinical note transcription with the patient, who gave verbal consent to proceed.  History of Present Illness               09/18/2022    9:15 AM 06/12/2022    9:25 AM 05/29/2022   10:33 AM  Depression screen PHQ 2/9  Decreased Interest 0 0 0  Down, Depressed, Hopeless 0 0 0  PHQ - 2 Score 0 0 0  Altered sleeping  0 1  Tired, decreased energy  0 0  Change in appetite  0 0  Feeling bad or failure about yourself   0 0  Trouble concentrating  0 0  Moving slowly or fidgety/restless  0 0  Suicidal thoughts  0 0  PHQ-9 Score  0 1  Difficult doing work/chores   Not difficult at all       No data to display          Medications: Outpatient Medications Prior to Visit  Medication Sig   acetaminophen (TYLENOL) 500 MG tablet Take 1,000 mg by mouth every 6 (six) hours as needed for mild pain.   amLODipine (NORVASC) 5 MG tablet TAKE 1 TABLET (5 MG TOTAL) BY MOUTH DAILY. (Patient taking differently: Take 5 mg by mouth every morning.)   Ascorbic Acid (VITAMIN C PO) Take 1 tablet by mouth daily at 6 (six) AM.   aspirin EC 81 MG tablet Take 81 mg by mouth daily. Swallow whole.   atorvastatin (LIPITOR) 80 MG tablet TAKE 1 TABLET BY MOUTH EVERY DAY (Patient taking differently: Take 80 mg by mouth every morning.)   buprenorphine (BUTRANS) 15 MCG/HR 1 patch once a week. Thursdays   calcium carbonate (TUMS - DOSED IN MG ELEMENTAL CALCIUM) 500 MG chewable tablet Chew 1 tablet by mouth as needed for indigestion or heartburn.   Calcium Carbonate Antacid (ALKA-SELTZER ANTACID PO) Take 1 tablet by mouth as needed.   celecoxib (CELEBREX) 100 MG capsule TAKE 1 CAPSULE BY MOUTH TWICE  DAILY   Cholecalciferol (VITAMIN  D3) 2000 units TABS Take 1 tablet by mouth daily at 6 (six) AM.   diazepam (VALIUM) 10 MG tablet Take 10 mg by mouth 2 (two) times daily.   donepezil (ARICEPT) 10 MG tablet TAKE 1 TABLET BY MOUTH AT  BEDTIME   gabapentin (NEURONTIN) 400 MG capsule TAKE 1 CAPSULE BY MOUTH 3 TIMES  DAILY   lidocaine (LIDODERM) 5 % 1 patch daily. Lower back and upper shoulder   Multiple Vitamin (MULTIVITAMIN) tablet Take 1 tablet by mouth daily.   Omega-3 Fatty Acids (FISH OIL PO) Take 1 tablet by mouth daily at 6 (six) AM.   prazosin (MINIPRESS) 1 MG capsule TAKE 2 CAPSULES BY MOUTH AT  BEDTIME DO NOT TAKE WITH AMBIEN   tiZANidine (ZANAFLEX) 4 MG tablet TAKE 1 AND 1/2 TABLETS(6 MG) BY MOUTH EVERY 6 HOURS AS NEEDED FOR MUSCLE SPASMS   No facility-administered medications prior to visit.    Review of Systems Except see HPI   {Insert previous labs (optional):23779} {See past labs  Heme  Chem  Endocrine  Serology  Results Review (optional):1}   Objective    There were no vitals taken for this visit. {Insert last  BP/Wt (optional):23777}{See vitals history (optional):1}   Physical Exam   No results found for any visits on 11/27/22.  Assessment & Plan    *** Assessment and Plan              No follow-ups on file.      [facility]

## 2022-11-27 ENCOUNTER — Ambulatory Visit: Payer: 59 | Admitting: Physician Assistant

## 2022-11-28 ENCOUNTER — Ambulatory Visit: Payer: 59 | Admitting: Physician Assistant

## 2022-12-02 DIAGNOSIS — I10 Essential (primary) hypertension: Secondary | ICD-10-CM | POA: Diagnosis not present

## 2022-12-02 DIAGNOSIS — G8921 Chronic pain due to trauma: Secondary | ICD-10-CM | POA: Diagnosis not present

## 2022-12-02 DIAGNOSIS — M791 Myalgia, unspecified site: Secondary | ICD-10-CM | POA: Diagnosis not present

## 2022-12-02 DIAGNOSIS — I69322 Dysarthria following cerebral infarction: Secondary | ICD-10-CM | POA: Diagnosis not present

## 2022-12-02 DIAGNOSIS — I69351 Hemiplegia and hemiparesis following cerebral infarction affecting right dominant side: Secondary | ICD-10-CM | POA: Diagnosis not present

## 2022-12-02 DIAGNOSIS — G894 Chronic pain syndrome: Secondary | ICD-10-CM | POA: Diagnosis not present

## 2022-12-02 DIAGNOSIS — Z79891 Long term (current) use of opiate analgesic: Secondary | ICD-10-CM | POA: Diagnosis not present

## 2022-12-03 ENCOUNTER — Other Ambulatory Visit: Payer: Self-pay | Admitting: Physician Assistant

## 2022-12-03 DIAGNOSIS — Z8673 Personal history of transient ischemic attack (TIA), and cerebral infarction without residual deficits: Secondary | ICD-10-CM | POA: Diagnosis not present

## 2022-12-03 DIAGNOSIS — R569 Unspecified convulsions: Secondary | ICD-10-CM | POA: Diagnosis not present

## 2022-12-03 DIAGNOSIS — E7800 Pure hypercholesterolemia, unspecified: Secondary | ICD-10-CM

## 2022-12-03 DIAGNOSIS — G8111 Spastic hemiplegia affecting right dominant side: Secondary | ICD-10-CM | POA: Diagnosis not present

## 2022-12-03 DIAGNOSIS — I10 Essential (primary) hypertension: Secondary | ICD-10-CM

## 2022-12-03 DIAGNOSIS — Z87898 Personal history of other specified conditions: Secondary | ICD-10-CM | POA: Diagnosis not present

## 2022-12-05 NOTE — Telephone Encounter (Signed)
Unable to refill per protocol, Rx request is too soon. Last refill 03/26/22 for 90 and 3 refills.  Requested Prescriptions  Pending Prescriptions Disp Refills   amLODipine (NORVASC) 5 MG tablet [Pharmacy Med Name: amLODIPine Besylate 5 MG Oral Tablet] 100 tablet 2    Sig: TAKE 1 TABLET BY MOUTH DAILY     Cardiovascular: Calcium Channel Blockers 2 Failed - 12/03/2022 10:04 PM      Failed - Valid encounter within last 6 months    Recent Outpatient Visits           6 months ago Annual physical exam   [facility] Alfredia Ferguson, PA-C   11 months ago Primary hypertension   [facility], [place], PA-C   1 year ago History of stroke with residual deficit   [facility] Alfredia Ferguson, PA-C   1 year ago Alterations of sensations following cerebrovascular accident   [facility] Alfredia Ferguson, PA-C   1 year ago Alterations of sensations following cerebrovascular accident   [facility] Alfredia Ferguson, PA-C       Future Appointments             In 5 days Pardue, Monico Blitz, DO [facility]            Passed - Last BP in normal range    BP Readings from Last 1 Encounters:  11/20/22 130/84         Passed - Last Heart Rate in normal range    Pulse Readings from Last 1 Encounters:  11/20/22 (!) 48          atorvastatin (LIPITOR) 80 MG tablet [Pharmacy Med Name: Atorvastatin Calcium 80 MG Oral Tablet] 100 tablet 2    Sig: TAKE 1 TABLET BY MOUTH DAILY     Cardiovascular:  Antilipid - Statins Failed - 12/03/2022 10:04 PM      Failed - Lipid Panel in normal range within the last 12 months    Cholesterol, Total  Date Value Ref Range Status  12/27/2021 132 100 - 199 mg/dL Final   LDL Chol Calc (NIH)  Date Value Ref Range Status  12/27/2021 70 0 - 99 mg/dL Final   HDL  Date Value Ref Range Status  12/27/2021 45  >39 mg/dL Final   Triglycerides  Date Value Ref Range Status  12/27/2021 87 0 - 149 mg/dL Final         Passed - Patient is not pregnant      Passed - Valid encounter within last 12 months    Recent Outpatient Visits           6 months ago Annual physical exam   [facility] Alfredia Ferguson, PA-C   11 months ago Primary hypertension   [facility], [place], PA-C   1 year ago History of stroke with residual deficit   [facility], Lillia Abed, PA-C   1 year ago Alterations of sensations following cerebrovascular accident   [facility] Alfredia Ferguson, PA-C   1 year ago Alterations of sensations following cerebrovascular accident   [facility] Alfredia Ferguson, PA-C       Future Appointments             In 5 days Pardue, Monico Blitz, DO [facility]

## 2022-12-10 ENCOUNTER — Ambulatory Visit: Payer: 59 | Admitting: Family Medicine

## 2022-12-17 ENCOUNTER — Ambulatory Visit (INDEPENDENT_AMBULATORY_CARE_PROVIDER_SITE_OTHER): Payer: 59 | Admitting: Family Medicine

## 2022-12-17 ENCOUNTER — Encounter: Payer: Self-pay | Admitting: Family Medicine

## 2022-12-17 VITALS — BP 110/77 | HR 79 | Wt 224.0 lb

## 2022-12-17 DIAGNOSIS — I10 Essential (primary) hypertension: Secondary | ICD-10-CM

## 2022-12-17 DIAGNOSIS — I6930 Unspecified sequelae of cerebral infarction: Secondary | ICD-10-CM | POA: Diagnosis not present

## 2022-12-17 DIAGNOSIS — E7800 Pure hypercholesterolemia, unspecified: Secondary | ICD-10-CM | POA: Diagnosis not present

## 2022-12-17 DIAGNOSIS — J302 Other seasonal allergic rhinitis: Secondary | ICD-10-CM | POA: Diagnosis not present

## 2022-12-17 DIAGNOSIS — Z2821 Immunization not carried out because of patient refusal: Secondary | ICD-10-CM | POA: Diagnosis not present

## 2022-12-17 DIAGNOSIS — I700 Atherosclerosis of aorta: Secondary | ICD-10-CM | POA: Diagnosis not present

## 2022-12-17 DIAGNOSIS — J3089 Other allergic rhinitis: Secondary | ICD-10-CM | POA: Diagnosis not present

## 2022-12-17 DIAGNOSIS — Z5320 Procedure and treatment not carried out because of patient's decision for unspecified reasons: Secondary | ICD-10-CM

## 2022-12-17 DIAGNOSIS — Z833 Family history of diabetes mellitus: Secondary | ICD-10-CM | POA: Diagnosis not present

## 2022-12-17 DIAGNOSIS — S32010S Wedge compression fracture of first lumbar vertebra, sequela: Secondary | ICD-10-CM

## 2022-12-17 DIAGNOSIS — G894 Chronic pain syndrome: Secondary | ICD-10-CM

## 2022-12-17 DIAGNOSIS — K219 Gastro-esophageal reflux disease without esophagitis: Secondary | ICD-10-CM | POA: Diagnosis not present

## 2022-12-17 MED ORDER — CETIRIZINE HCL 10 MG PO TABS
10.0000 mg | ORAL_TABLET | Freq: Every day | ORAL | 3 refills | Status: DC
Start: 2022-12-17 — End: 2023-09-10

## 2022-12-17 MED ORDER — FAMOTIDINE 20 MG PO TABS
20.0000 mg | ORAL_TABLET | Freq: Every day | ORAL | 3 refills | Status: DC
Start: 2022-12-17 — End: 2023-09-10

## 2022-12-17 NOTE — Progress Notes (Signed)
Established patient visit   Patient: David Reeves   DOB: 09-15-1970   52 y.o. Male  MRN: [id number] Visit Date: 12/17/2022  Today's healthcare provider: Sherlyn Hay, DO   Chief Complaint  Patient presents with   Medical Management of Chronic Issues   Subjective    HPI Sees pain specialist for pain, which is going well Diazepam - takes twice daily for muscle spasms/related pain Has not eaten yet today; usually eats one time daily, due to pain medication reducing his appetite Fighting with insurance to get the vagal nerve stimulator.  - he was told they don't want to pay because of his stroke.  Seizure-like activity Recent ER visit due to seizure-like activity at home. No history of seizures.   - left leg started bouncing while he was laying in bed 11/20/22 CT head w/o contrast - Chronic infarct in the left ACA and MCA distribution affecting extensive cortex. Has followed up with neurology and has an EEG ordered.  Reflux -takes famotidine daily.  Uses alka-seltzer or tums as needed. Tries to avoid sausage or other processed meats in particular, he has sever heart burn. He states this is mostly under control.  It occurs about three times monthly.  Currently taking 2 Benadryl 25 mg tablets 3 times a day for allergies    Medications: Outpatient Medications Prior to Visit  Medication Sig   acetaminophen (TYLENOL) 500 MG tablet Take 1,000 mg by mouth every 6 (six) hours as needed for mild pain.   amLODipine (NORVASC) 5 MG tablet TAKE 1 TABLET (5 MG TOTAL) BY MOUTH DAILY. (Patient taking differently: Take 5 mg by mouth every morning.)   Ascorbic Acid (VITAMIN C PO) Take 1 tablet by mouth daily at 6 (six) AM.   aspirin EC 81 MG tablet Take 81 mg by mouth daily. Swallow whole.   atorvastatin (LIPITOR) 80 MG tablet TAKE 1 TABLET BY MOUTH EVERY DAY (Patient taking differently: Take 80 mg by mouth every morning.)   buprenorphine (BUTRANS) 15 MCG/HR 1 patch once a week.  Thursdays   calcium carbonate (TUMS - DOSED IN MG ELEMENTAL CALCIUM) 500 MG chewable tablet Chew 1 tablet by mouth as needed for indigestion or heartburn.   Calcium Carbonate Antacid (ALKA-SELTZER ANTACID PO) Take 1 tablet by mouth as needed.   celecoxib (CELEBREX) 100 MG capsule TAKE 1 CAPSULE BY MOUTH TWICE  DAILY   Cholecalciferol (VITAMIN D3) 2000 units TABS Take 1 tablet by mouth daily at 6 (six) AM.   diazepam (VALIUM) 10 MG tablet Take 10 mg by mouth 2 (two) times daily.   donepezil (ARICEPT) 10 MG tablet TAKE 1 TABLET BY MOUTH AT  BEDTIME   gabapentin (NEURONTIN) 400 MG capsule TAKE 1 CAPSULE BY MOUTH 3 TIMES  DAILY   lidocaine (LIDODERM) 5 % 1 patch daily. Lower back and upper shoulder   Multiple Vitamin (MULTIVITAMIN) tablet Take 1 tablet by mouth daily.   Omega-3 Fatty Acids (FISH OIL PO) Take 1 tablet by mouth daily at 6 (six) AM.   prazosin (MINIPRESS) 1 MG capsule TAKE 2 CAPSULES BY MOUTH AT  BEDTIME DO NOT TAKE WITH AMBIEN   tiZANidine (ZANAFLEX) 4 MG tablet TAKE 1 AND 1/2 TABLETS(6 MG) BY MOUTH EVERY 6 HOURS AS NEEDED FOR MUSCLE SPASMS   No facility-administered medications prior to visit.    Review of Systems  Constitutional:  Negative for appetite change, chills and fever.  HENT:  Positive for congestion and rhinorrhea.   Respiratory:  Negative for chest tightness, shortness of breath and wheezing.   Cardiovascular:  Negative for chest pain and palpitations.  Gastrointestinal:  Negative for abdominal pain, nausea and vomiting.  Neurological:  Positive for seizures (see HPI) and weakness (baseline right side). Negative for numbness.        Objective    BP 110/77 (BP Location: Right Arm, Patient Position: Sitting, Cuff Size: Normal)   Pulse 79   Wt 224 lb (101.6 kg)   SpO2 100%   BMI 29.55 kg/m     Physical Exam Constitutional:      Appearance: Normal appearance.  HENT:     Head: Normocephalic and atraumatic.  Eyes:     General: No scleral icterus.     Extraocular Movements: Extraocular movements intact.     Conjunctiva/sclera: Conjunctivae normal.  Cardiovascular:     Rate and Rhythm: Normal rate and regular rhythm.     Pulses: Normal pulses.     Heart sounds: Normal heart sounds.  Pulmonary:     Effort: Pulmonary effort is normal. No respiratory distress.     Breath sounds: Normal breath sounds.  Abdominal:     General: Bowel sounds are normal. There is no distension.     Palpations: Abdomen is soft. There is no mass.     Tenderness: There is no abdominal tenderness. There is no guarding.  Musculoskeletal:     Right lower leg: Edema (trace) present.     Left lower leg: No edema.  Skin:    General: Skin is warm and dry.  Neurological:     Mental Status: He is alert and oriented to person, place, and time. Mental status is at baseline.     Cranial Nerves: Dysarthria present.     Sensory: No sensory deficit.     Motor: Weakness (right arm/hand (no fine motor control)) present. No tremor or seizure activity (recent ER visit for seizure-like activity).  Psychiatric:        Mood and Affect: Mood normal.        Behavior: Behavior normal.      Results for orders placed or performed in visit on 12/17/22  Lipid panel  Result Value Ref Range   Cholesterol, Total 170 100 - 199 mg/dL   Triglycerides 161 0 - 149 mg/dL   HDL 51 >09 mg/dL   VLDL Cholesterol Cal 21 5 - 40 mg/dL   LDL Chol Calc (NIH) 98 0 - 99 mg/dL   Chol/HDL Ratio 3.3 0.0 - 5.0 ratio  Hemoglobin A1c  Result Value Ref Range   Hgb A1c MFr Bld 5.6 4.8 - 5.6 %   Est. average glucose Bld gHb Est-mCnc 114 mg/dL    Assessment & Plan    Primary hypertension Assessment & Plan: Blood pressure well-controlled today.  Continue amlodipine 5 mg daily.   Closed compression fracture of L1 vertebra, sequela Assessment & Plan: No acute concerns.  Patient's pain is well-controlled through following with pain management.   Elevated LDL cholesterol level Assessment &  Plan: Patient is fasting today.  Will recheck his lipid panel today.  Patient continues to take his atorvastatin 80 mg daily.  Orders: -     Lipid panel  Colon cancer screening declined  Influenza vaccination declined by patient  History of stroke with residual deficit Assessment & Plan: Patient taking aspirin 81 mg daily and atorvastatin 80 mg daily.  Patient has been doing well and has been regaining strength in his right lower extremity. No acute concerns.  Continue to  monitor.    Chronic pain associated with significant psychosocial dysfunction Assessment & Plan: Managed by pain management. Under good control at this time.   Aortic atherosclerosis [facility]) Assessment & Plan: Seen on CT renal stone study done 04/05/2021.   No acute concerns.     Gastroesophageal reflux disease, unspecified whether esophagitis present Assessment & Plan: Patient currently taking famotidine 20 mg daily, which he reports controls his reflux fairly well, along with Tums as needed and Alka-Seltzer as needed.  He reports he uses his as-needed medications approximately 3 times per month.  Will go ahead and send in prescription for famotidine.  Orders: -     Famotidine; Take 1 tablet (20 mg total) by mouth daily.  Dispense: 90 tablet; Refill: 3  Perennial allergic rhinitis with seasonal variation Assessment & Plan: Patient currently taking Benadryl 50 mg 3 times daily.  Advised him that he should be taking a daily allergy medication, rather than an as needed medication such as Benadryl.  Will prescribe him Zyrtec 10 mg daily as noted below.  Gave him instructions for over-the-counter allergy medications in case his insurance does not cover this.  Patient does report that he previously had good control with Zyrtec, but it was too expensive at the time.  Orders: -     Cetirizine HCl; Take 1 tablet (10 mg total) by mouth daily.  Dispense: 90 tablet; Refill: 3  Family history of diabetes mellitus in  mother -     Hemoglobin A1c    Return in about 6 months (around 06/19/2023) for CPE.      I discussed the assessment and treatment plan with the patient  The patient was provided an opportunity to ask questions and all were answered. The patient agreed with the plan and demonstrated an understanding of the instructions.   The patient was advised to call back or seek an in-person evaluation if the symptoms worsen or if the condition fails to improve as anticipated.    Sherlyn Hay, DO  [facility] [phone] (phone) [phone] (fax)  [facility]

## 2022-12-17 NOTE — Assessment & Plan Note (Signed)
Managed by pain management. Under good control at this time.

## 2022-12-17 NOTE — Patient Instructions (Addendum)
For the shingles vaccine, you will need to get it done at your pharmacy.   For allergies, try cetirizine (Zyrtec) 10 mg daily or fexofenadine (Allegra) 160 mg daily.  - If this medication dries you out, switch to loratadine (Claritin)  - If this does not work well enough, switch to levocetirizine (Xyzal) Please stop the benadryl after a day or two.

## 2022-12-17 NOTE — Assessment & Plan Note (Signed)
No acute concerns.  Patient's pain is well-controlled through following with pain management.

## 2022-12-17 NOTE — Assessment & Plan Note (Signed)
Patient taking aspirin 81 mg daily and atorvastatin 80 mg daily.  Patient has been doing well and has been regaining strength in his right lower extremity. No acute concerns.  Continue to monitor.

## 2022-12-17 NOTE — Assessment & Plan Note (Signed)
Patient is fasting today.  Will recheck his lipid panel today.  Patient continues to take his atorvastatin 80 mg daily.

## 2022-12-17 NOTE — Assessment & Plan Note (Addendum)
Patient currently taking famotidine 20 mg daily, which he reports controls his reflux fairly well, along with Tums as needed and Alka-Seltzer as needed.  He reports he uses his as-needed medications approximately 3 times per month.  Will go ahead and send in prescription for famotidine.

## 2022-12-17 NOTE — Assessment & Plan Note (Signed)
Patient currently taking Benadryl 50 mg 3 times daily.  Advised him that he should be taking a daily allergy medication, rather than an as needed medication such as Benadryl.  Will prescribe him Zyrtec 10 mg daily as noted below.  Gave him instructions for over-the-counter allergy medications in case his insurance does not cover this.  Patient does report that he previously had good control with Zyrtec, but it was too expensive at the time.

## 2022-12-17 NOTE — Assessment & Plan Note (Addendum)
-  Blood pressure well-controlled today  -Continue amlodipine 5 mg daily

## 2022-12-18 ENCOUNTER — Encounter: Payer: Self-pay | Admitting: Family Medicine

## 2022-12-18 DIAGNOSIS — I700 Atherosclerosis of aorta: Secondary | ICD-10-CM | POA: Insufficient documentation

## 2022-12-18 LAB — HEMOGLOBIN A1C
Est. average glucose Bld gHb Est-mCnc: 114 mg/dL
Hgb A1c MFr Bld: 5.6 % (ref 4.8–5.6)

## 2022-12-18 LAB — LIPID PANEL
Chol/HDL Ratio: 3.3 ratio (ref 0.0–5.0)
Cholesterol, Total: 170 mg/dL (ref 100–199)
HDL: 51 mg/dL (ref >39)
LDL Chol Calc (NIH): 98 mg/dL (ref 0–99)
Triglycerides: 115 mg/dL (ref 0–149)
VLDL Cholesterol Cal: 21 mg/dL (ref 5–40)

## 2022-12-18 NOTE — Assessment & Plan Note (Signed)
Seen on CT renal stone study done 04/05/2021.   No acute concerns.

## 2022-12-18 NOTE — Addendum Note (Signed)
Addended by: Jacquenette Shone on: 12/18/2022 10:38 AM   Modules accepted: Level of Service

## 2023-01-03 DIAGNOSIS — Z8673 Personal history of transient ischemic attack (TIA), and cerebral infarction without residual deficits: Secondary | ICD-10-CM | POA: Diagnosis not present

## 2023-01-04 ENCOUNTER — Encounter: Payer: Self-pay | Admitting: Family Medicine

## 2023-01-06 ENCOUNTER — Telehealth: Payer: Self-pay | Admitting: Family Medicine

## 2023-01-06 DIAGNOSIS — I69398 Other sequelae of cerebral infarction: Secondary | ICD-10-CM

## 2023-01-06 DIAGNOSIS — G8111 Spastic hemiplegia affecting right dominant side: Secondary | ICD-10-CM

## 2023-01-06 NOTE — Telephone Encounter (Addendum)
Referral Request - Has patient seen PCP for this complaint? Yes.   *If NO, is insurance requiring patient see PCP for this issue before PCP can refer them? Referral for which specialty: Neuro surgeon Preferred provider/office: Dr Marianna Fuss, M.D., Ph.D. [facility] Phone:  [phone]   [phone]  attn emily domish  Reason for referral: Vivistem consult    Selena Batten (clinical navigator) [phone]  She called for the pt.  She said the appt w [facility] did not work out.

## 2023-01-07 NOTE — Telephone Encounter (Signed)
Referral placed.

## 2023-01-13 DIAGNOSIS — G894 Chronic pain syndrome: Secondary | ICD-10-CM | POA: Diagnosis not present

## 2023-01-13 DIAGNOSIS — M791 Myalgia, unspecified site: Secondary | ICD-10-CM | POA: Diagnosis not present

## 2023-01-13 DIAGNOSIS — I10 Essential (primary) hypertension: Secondary | ICD-10-CM | POA: Diagnosis not present

## 2023-01-13 DIAGNOSIS — I69351 Hemiplegia and hemiparesis following cerebral infarction affecting right dominant side: Secondary | ICD-10-CM | POA: Diagnosis not present

## 2023-01-13 DIAGNOSIS — I69322 Dysarthria following cerebral infarction: Secondary | ICD-10-CM | POA: Diagnosis not present

## 2023-01-13 DIAGNOSIS — G8921 Chronic pain due to trauma: Secondary | ICD-10-CM | POA: Diagnosis not present

## 2023-01-22 ENCOUNTER — Encounter: Payer: 59 | Attending: Physical Medicine & Rehabilitation | Admitting: Physical Medicine & Rehabilitation

## 2023-01-22 ENCOUNTER — Encounter: Payer: Self-pay | Admitting: Physical Medicine & Rehabilitation

## 2023-01-22 VITALS — BP 105/70 | HR 71 | Ht 73.0 in | Wt 221.0 lb

## 2023-01-22 DIAGNOSIS — G8111 Spastic hemiplegia affecting right dominant side: Secondary | ICD-10-CM | POA: Diagnosis not present

## 2023-01-22 MED ORDER — ONABOTULINUMTOXINA 100 UNITS IJ SOLR
200.0000 [IU] | Freq: Once | INTRAMUSCULAR | Status: AC
Start: 2023-01-22 — End: 2023-01-22
  Administered 2023-01-22: 200 [IU] via INTRAMUSCULAR

## 2023-01-22 NOTE — Patient Instructions (Signed)
ALWAYS FEEL FREE TO CALL OUR OFFICE WITH ANY PROBLEMS OR QUESTIONS ([phone])  **PLEASE NOTE** ALL MEDICATION REFILL REQUESTS (INCLUDING CONTROLLED SUBSTANCES) NEED TO BE MADE AT LEAST 7 DAYS PRIOR TO REFILL BEING DUE. ANY REFILL REQUESTS INSIDE THAT TIME FRAME MAY RESULT IN DELAYS IN RECEIVING YOUR PRESCRIPTION.                    

## 2023-01-22 NOTE — Progress Notes (Signed)
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 This includes bleeding, bruising, infection, excessive weakness, or medication side effects. A REMS form is on file and signed.  Needle: 50mm injectable monopolar needle electrode    Number of units per muscle Pectoralis Major 0 units Pectoralis Minor 0 units Biceps 0 units Brachioradialis 0 units FCR 25 units FCU 25 units FDS 75 units FDP 75 units FPL 0 units Palmaris Longus 0 units Pronator Teres 0 units Pronator Quadratus 0 units Lumbricals 0 units All injections were done after obtaining appropriate EMG activity and after negative drawback for blood. The patient tolerated the procedure well. Post procedure instructions were given. No follow-ups on file.

## 2023-01-28 ENCOUNTER — Other Ambulatory Visit: Payer: Self-pay | Admitting: Physician Assistant

## 2023-01-28 DIAGNOSIS — G8929 Other chronic pain: Secondary | ICD-10-CM

## 2023-01-30 DIAGNOSIS — I69351 Hemiplegia and hemiparesis following cerebral infarction affecting right dominant side: Secondary | ICD-10-CM | POA: Diagnosis not present

## 2023-02-02 DIAGNOSIS — Z8673 Personal history of transient ischemic attack (TIA), and cerebral infarction without residual deficits: Secondary | ICD-10-CM | POA: Diagnosis not present

## 2023-02-11 ENCOUNTER — Encounter: Payer: Self-pay | Admitting: Family Medicine

## 2023-02-11 DIAGNOSIS — I10 Essential (primary) hypertension: Secondary | ICD-10-CM

## 2023-02-12 ENCOUNTER — Other Ambulatory Visit: Payer: Self-pay | Admitting: Family Medicine

## 2023-02-12 DIAGNOSIS — I10 Essential (primary) hypertension: Secondary | ICD-10-CM

## 2023-02-12 DIAGNOSIS — E7800 Pure hypercholesterolemia, unspecified: Secondary | ICD-10-CM

## 2023-02-12 IMAGING — US US SCROTUM W/ DOPPLER COMPLETE
1 series · 14 of 25 positions shown · non-contrast
Comparison: None.

CLINICAL DATA: Scrotal edema

EXAM:
SCROTAL ULTRASOUND
DOPPLER ULTRASOUND OF THE TESTICLES
TECHNIQUE: Complete ultrasound examination of the testicles, epididymis, and
other scrotal structures was performed. Color and spectral Doppler
ultrasound were also utilized to evaluate blood flow to the
testicles.

[Series 1: us scrotum w/ doppler complete · 0.09mm/px · 14 of 60 slices shown]
[im 1/60]
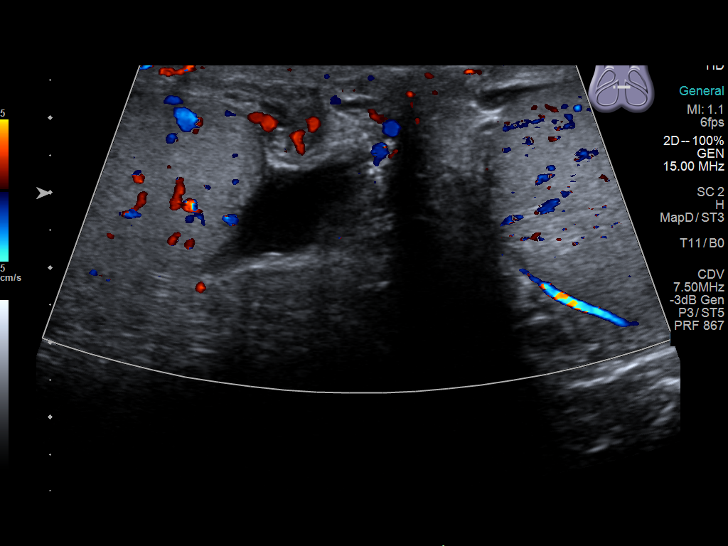
[im 5/60]
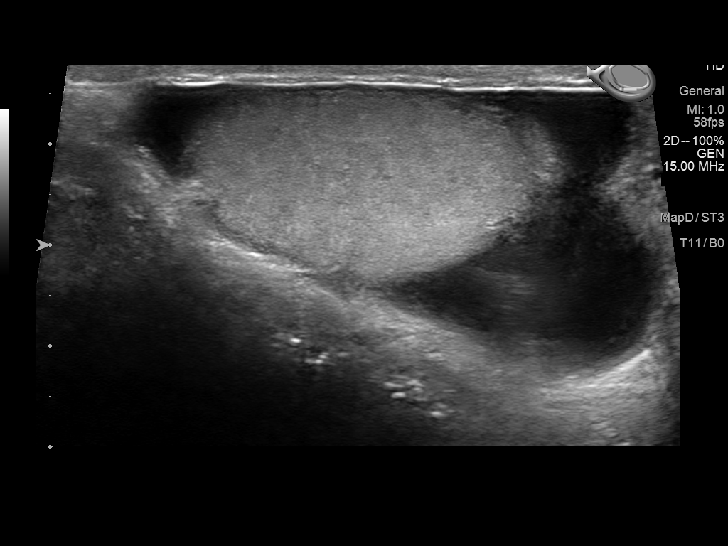
[im 10/60]
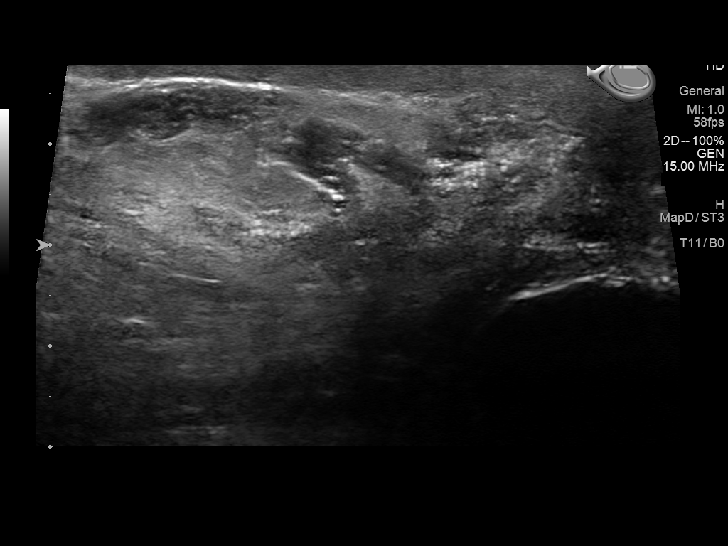
[im 15/60]
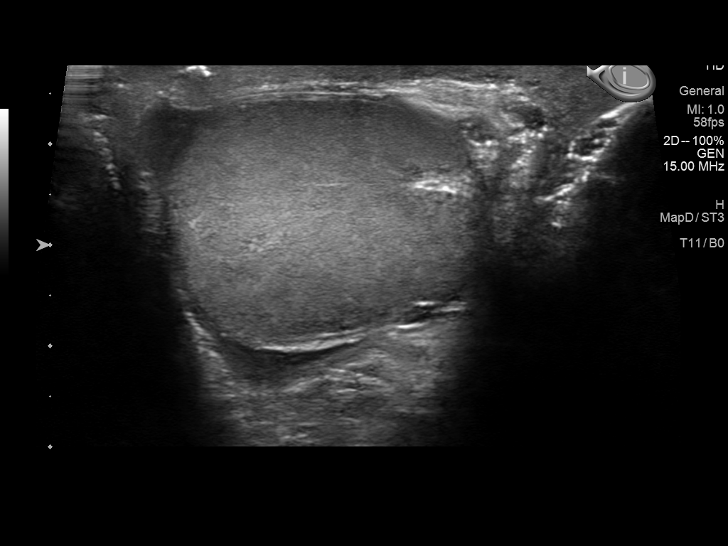
[im 20/60]
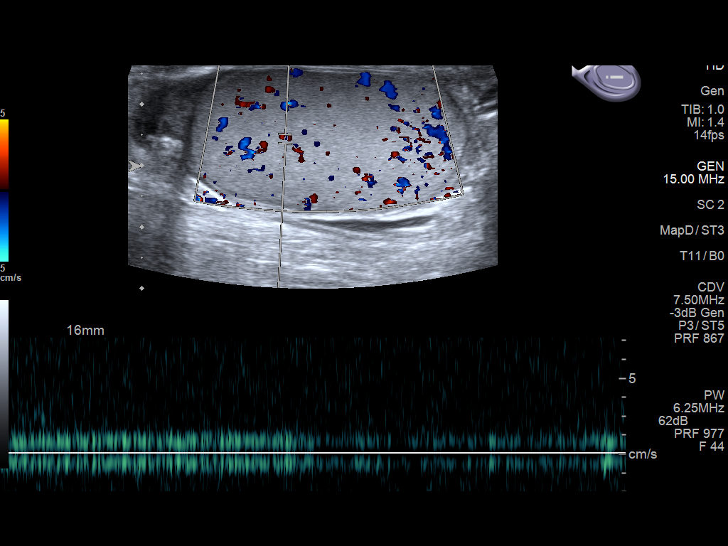
[im 23/60]
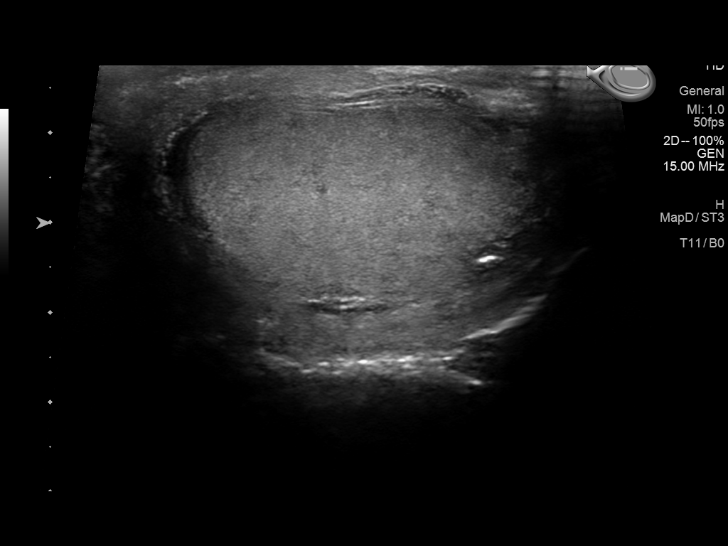
[im 28/60]
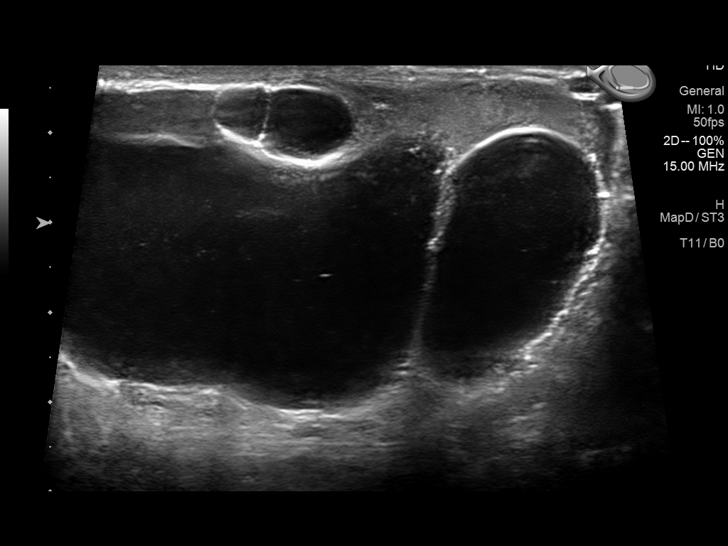
[im 32/60]
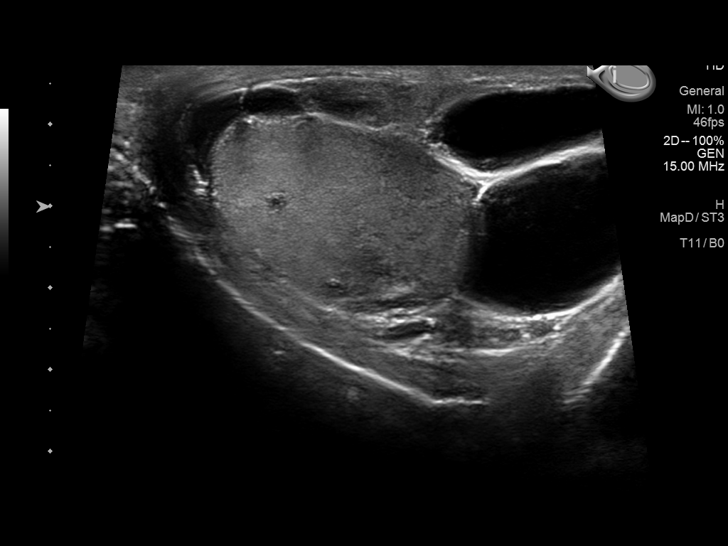
[im 37/60]
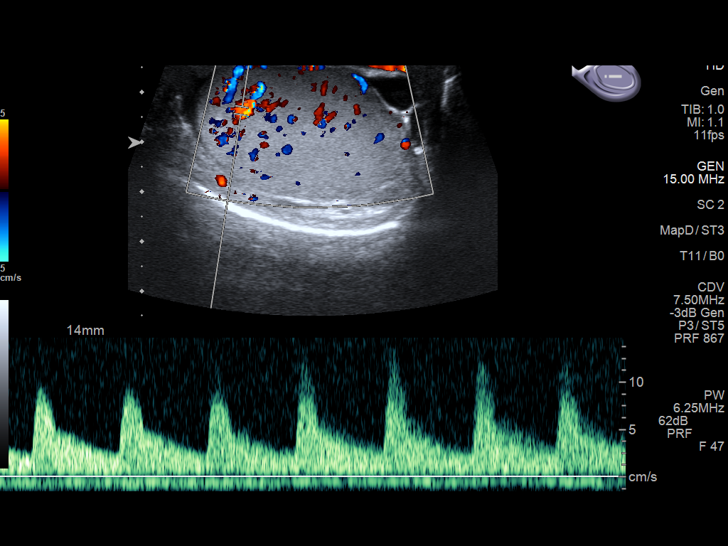
[im 40/60]
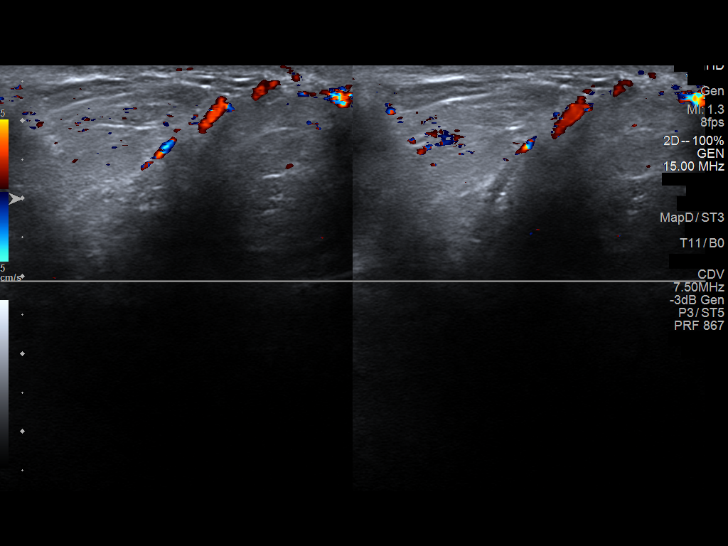
[im 45/60]
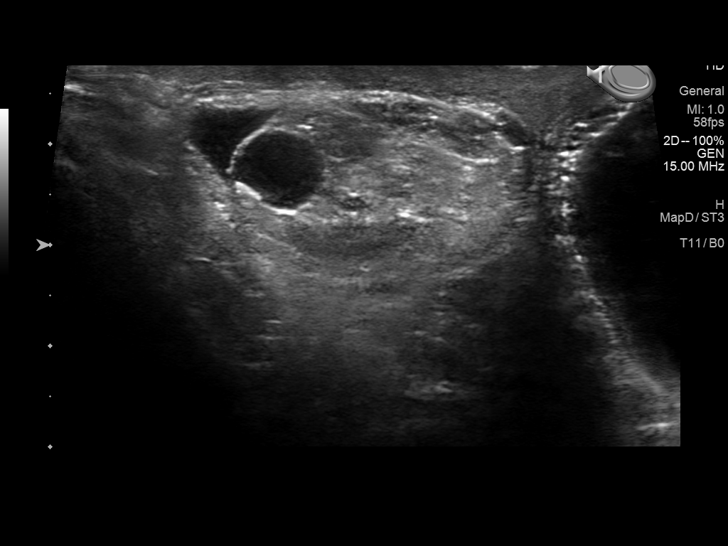
[im 50/60]
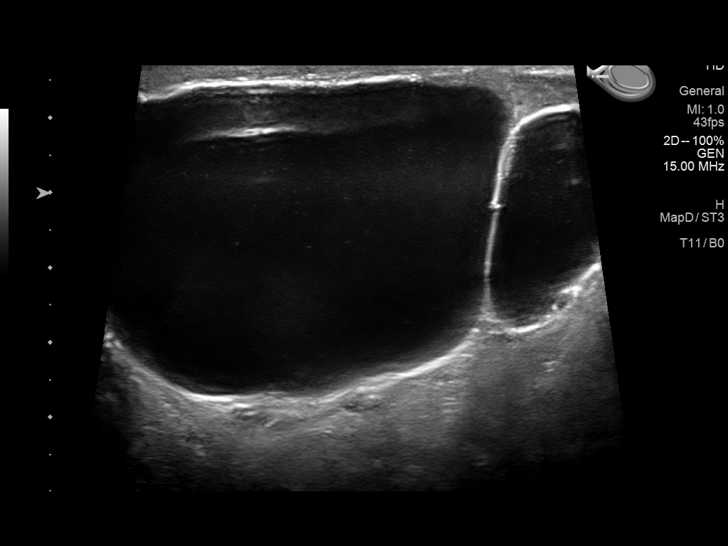
[im 55/60]
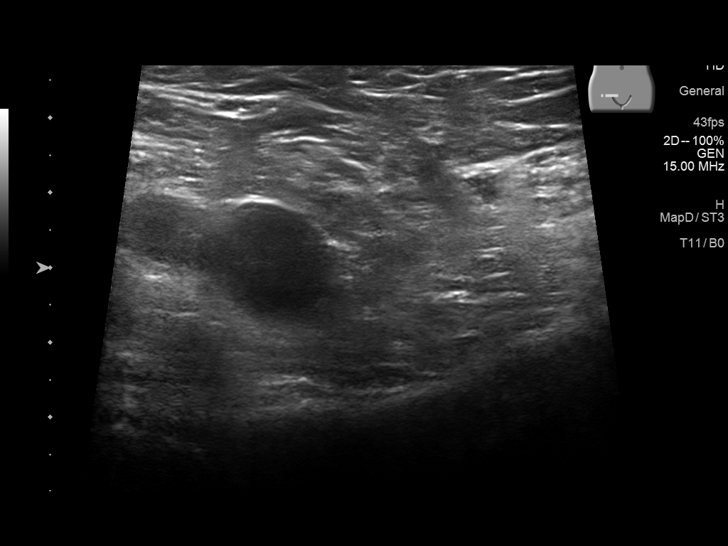
[im 60/60]
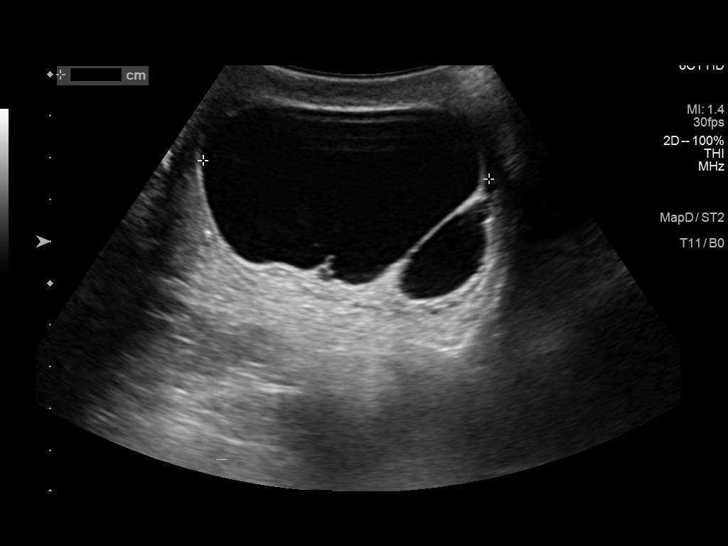

[14 of 25 positions shown; findings below may reference images not displayed]

FINDINGS: Right testicle

Measurements: 4.4 x 2.7 x 3.1 cm. No mass or microlithiasis
visualized.

Left testicle

Measurements: 3.9 x 3 x 3.5 cm. No mass or microlithiasis
visualized.

Right epididymis:  Cyst measuring 9 mm

Left epididymis: Multiple large cysts measuring up to 8.8 x 4.6 x
6.9 cm suggestive of spermatocele.

Hydrocele:  Small right hydrocele.

Varicocele:  None visualized.

Pulsed Doppler interrogation of both testes demonstrates normal low
resistance arterial and venous waveforms bilaterally.

Targeted ultrasound of left inguinal region demonstrates no
definitive hernia
IMPRESSION: 1. Small right-sided hydrocele
2. 8.8 cm cyst in the region of left epididymis suggestive of
spermatocele
3. Negative for intratesticular mass.

## 2023-02-12 MED ORDER — AMLODIPINE BESYLATE 5 MG PO TABS: 5.0000 mg | ORAL_TABLET | ORAL | 0 refills | Status: DC

## 2023-02-12 NOTE — Telephone Encounter (Unsigned)
Copied from CRM [id number]. Topic: General - Other >> Feb 12, 2023  9:15 AM Everette C wrote: Reason for CRM: Medication Refill - Medication: amLODipine (NORVASC) 5 MG tablet [[id number]]  atorvastatin (LIPITOR) 80 MG tablet [[id number]]   Has the patient contacted their pharmacy? Yes.   (Agent: If no, request that the patient contact the pharmacy for the refill. If patient does not wish to contact the pharmacy document the reason why and proceed with request.) (Agent: If yes, when and what did the pharmacy advise?)  Preferred Pharmacy (with phone number or street name): [id number] [place], [place] - [address] AT [facility] OF [address] [place] [address] Phone: [phone] Fax: [phone] Hours: Not open 24 hours   Has the patient been seen for an appointment in the last year OR does the patient have an upcoming appointment? Yes.    Agent: Please be advised that RX refills may take up to 3 business days. We ask that you follow-up with your pharmacy.

## 2023-02-13 MED ORDER — ATORVASTATIN CALCIUM 80 MG PO TABS: 80.0000 mg | ORAL_TABLET | Freq: Every day | ORAL | 2 refills | Status: DC

## 2023-02-13 NOTE — Telephone Encounter (Signed)
Requested Prescriptions  Pending Prescriptions Disp Refills   amLODipine (NORVASC) 5 MG tablet 30 tablet     Sig: Take 1 tablet (5 mg total) by mouth every morning.     Cardiovascular: Calcium Channel Blockers 2 Passed - 02/12/2023  9:28 AM      Passed - Last BP in normal range    BP Readings from Last 1 Encounters:  01/22/23 105/70         Passed - Last Heart Rate in normal range    Pulse Readings from Last 1 Encounters:  01/22/23 71         Passed - Valid encounter within last 6 months    Recent Outpatient Visits           1 month ago Primary hypertension   [facility] [place], Monico Blitz, DO   8 months ago Annual physical exam   [facility] Alfredia Ferguson, PA-C   1 year ago Primary hypertension   [facility] [place], [place], PA-C   1 year ago History of stroke with residual deficit   [facility] [place], Lillia Abed, PA-C   1 year ago Alterations of sensations following cerebrovascular accident   [facility] [place], Lillia Abed, PA-C               atorvastatin (LIPITOR) 80 MG tablet 90 tablet 2    Sig: Take 1 tablet (80 mg total) by mouth daily.     Cardiovascular:  Antilipid - Statins Failed - 02/12/2023  9:28 AM      Failed - Lipid Panel in normal range within the last 12 months    Cholesterol, Total  Date Value Ref Range Status  12/17/2022 170 100 - 199 mg/dL Final   LDL Chol Calc (NIH)  Date Value Ref Range Status  12/17/2022 98 0 - 99 mg/dL Final   HDL  Date Value Ref Range Status  12/17/2022 51 >39 mg/dL Final   Triglycerides  Date Value Ref Range Status  12/17/2022 115 0 - 149 mg/dL Final         Passed - Patient is not pregnant      Passed - Valid encounter within last 12 months    Recent Outpatient Visits           1 month ago Primary hypertension   [place] [facility] [place], Monico Blitz, DO   8  months ago Annual physical exam   [facility] Alfredia Ferguson, PA-C   1 year ago Primary hypertension    [facility] Alfredia Ferguson, PA-C   1 year ago History of stroke with residual deficit   [facility] Alfredia Ferguson, PA-C   1 year ago Alterations of sensations following cerebrovascular accident   [facility] Alfredia Ferguson, [place]

## 2023-02-17 DIAGNOSIS — G8921 Chronic pain due to trauma: Secondary | ICD-10-CM | POA: Diagnosis not present

## 2023-02-17 DIAGNOSIS — Z79891 Long term (current) use of opiate analgesic: Secondary | ICD-10-CM | POA: Diagnosis not present

## 2023-02-17 DIAGNOSIS — G894 Chronic pain syndrome: Secondary | ICD-10-CM | POA: Diagnosis not present

## 2023-02-17 DIAGNOSIS — M791 Myalgia, unspecified site: Secondary | ICD-10-CM | POA: Diagnosis not present

## 2023-02-17 DIAGNOSIS — I69351 Hemiplegia and hemiparesis following cerebral infarction affecting right dominant side: Secondary | ICD-10-CM | POA: Diagnosis not present

## 2023-02-17 DIAGNOSIS — I69322 Dysarthria following cerebral infarction: Secondary | ICD-10-CM | POA: Diagnosis not present

## 2023-02-17 DIAGNOSIS — I10 Essential (primary) hypertension: Secondary | ICD-10-CM | POA: Diagnosis not present

## 2023-03-05 DIAGNOSIS — Z8673 Personal history of transient ischemic attack (TIA), and cerebral infarction without residual deficits: Secondary | ICD-10-CM | POA: Diagnosis not present

## 2023-03-09 ENCOUNTER — Other Ambulatory Visit: Payer: Self-pay | Admitting: Family Medicine

## 2023-03-09 DIAGNOSIS — I10 Essential (primary) hypertension: Secondary | ICD-10-CM

## 2023-03-10 ENCOUNTER — Telehealth: Payer: Self-pay

## 2023-03-10 DIAGNOSIS — I10 Essential (primary) hypertension: Secondary | ICD-10-CM | POA: Diagnosis not present

## 2023-03-10 DIAGNOSIS — I69398 Other sequelae of cerebral infarction: Secondary | ICD-10-CM

## 2023-03-10 DIAGNOSIS — M791 Myalgia, unspecified site: Secondary | ICD-10-CM | POA: Diagnosis not present

## 2023-03-10 DIAGNOSIS — I69322 Dysarthria following cerebral infarction: Secondary | ICD-10-CM | POA: Diagnosis not present

## 2023-03-10 DIAGNOSIS — G894 Chronic pain syndrome: Secondary | ICD-10-CM | POA: Diagnosis not present

## 2023-03-10 DIAGNOSIS — G8921 Chronic pain due to trauma: Secondary | ICD-10-CM | POA: Diagnosis not present

## 2023-03-10 DIAGNOSIS — I69351 Hemiplegia and hemiparesis following cerebral infarction affecting right dominant side: Secondary | ICD-10-CM | POA: Diagnosis not present

## 2023-03-10 NOTE — Telephone Encounter (Signed)
Patient requesting refill on tizanidine 4mg  tab sent to [facility] in [place] on [address]

## 2023-03-10 NOTE — Telephone Encounter (Signed)
Requested Prescriptions  Refused Prescriptions Disp Refills   amLODipine (NORVASC) 5 MG tablet [Pharmacy Med Name: AMLODIPINE BESYLATE 5MG  TABLETS] 30 tablet     Sig: TAKE 1 TABLET(5 MG) BY MOUTH EVERY MORNING     Cardiovascular: Calcium Channel Blockers 2 Passed - 03/09/2023  2:57 PM      Passed - Last BP in normal range    BP Readings from Last 1 Encounters:  01/22/23 105/70         Passed - Last Heart Rate in normal range    Pulse Readings from Last 1 Encounters:  01/22/23 71         Passed - Valid encounter within last 6 months    Recent Outpatient Visits           2 months ago Primary hypertension   [facility] [place], Monico Blitz, DO   9 months ago Annual physical exam   [facility] Alfredia Ferguson, PA-C   1 year ago Primary hypertension   [facility] Ok Edwards, [place], PA-C   1 year ago History of stroke with residual deficit   [facility] [place], Lillia Abed, PA-C   1 year ago Alterations of sensations following cerebrovascular accident   [facility] Alfredia Ferguson, [place]

## 2023-03-11 MED ORDER — TIZANIDINE HCL 4 MG PO TABS
6.0000 mg | ORAL_TABLET | Freq: Four times a day (QID) | ORAL | 6 refills | Status: DC
Start: 2023-03-11 — End: 2023-04-29

## 2023-03-11 NOTE — Telephone Encounter (Signed)
Rx sent.  thx 

## 2023-03-13 DIAGNOSIS — Z79891 Long term (current) use of opiate analgesic: Secondary | ICD-10-CM | POA: Diagnosis not present

## 2023-03-24 IMAGING — MR MR LUMBAR SPINE W/O CM
4 of 5 series · 25 of 48 positions shown · non-contrast
Comparison: X-ray lumbar 07/26/2016;

CLINICAL DATA: Chronic lateral low back pain with spasms.

EXAM:
MRI LUMBAR SPINE WITHOUT CONTRAST
TECHNIQUE: Multiplanar, multisequence MR imaging of the lumbar spine was
performed. No intravenous contrast was administered.

[Series 2: T2 · sagittal · 4.0mm · 0.81mm/px · 6 of 15 slices shown (1 of 2)]
[im 1/15]
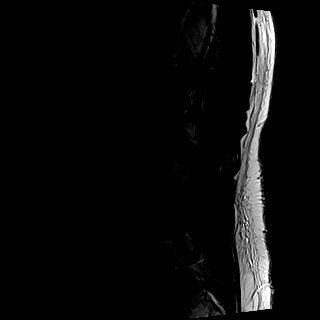
[im 3/15]
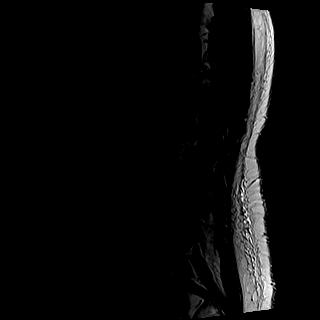
[im 6/15]
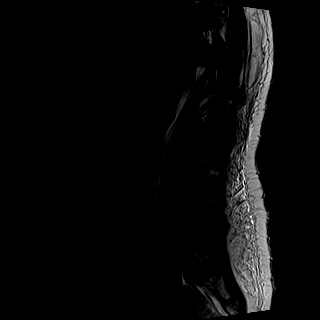
[im 9/15]
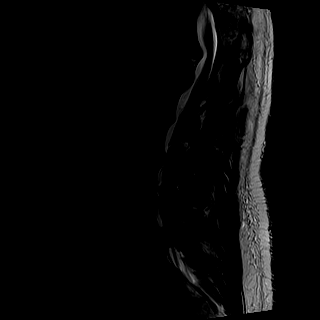
[im 12/15]
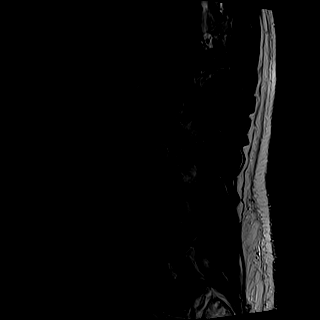
[im 15/15]
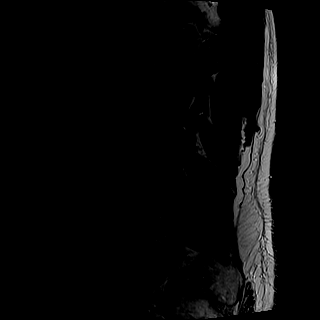

[Series 3: T1 · sagittal · 4.0mm · 0.41mm/px · 5 of 15 slices shown (1 of 2)]
[im 1/15]
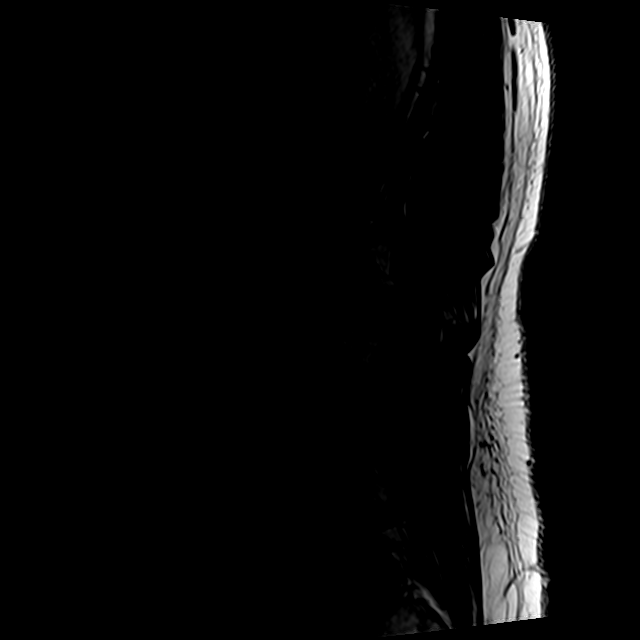
[im 4/15]
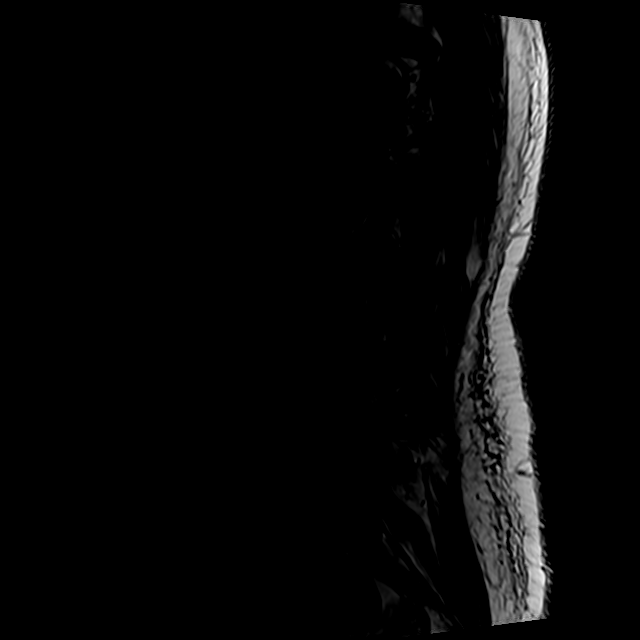
[im 8/15]
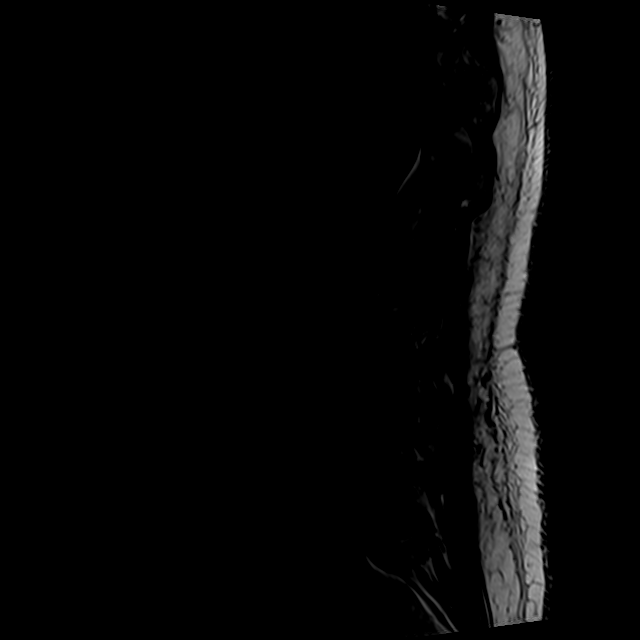
[im 11/15]
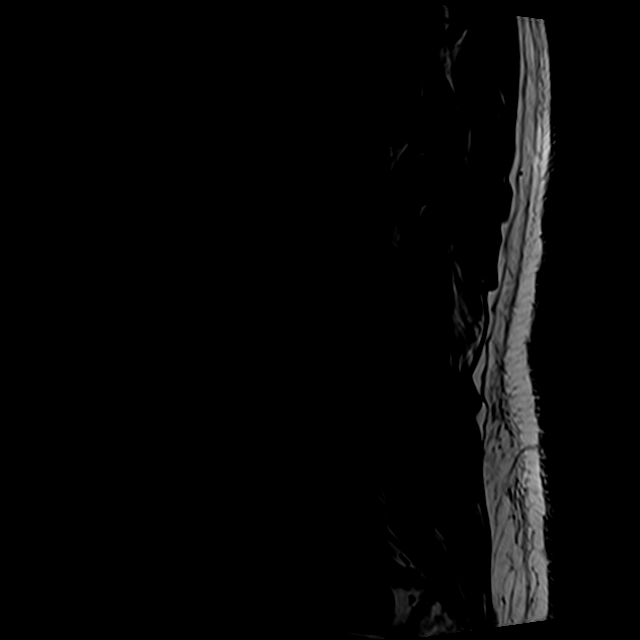
[im 15/15]
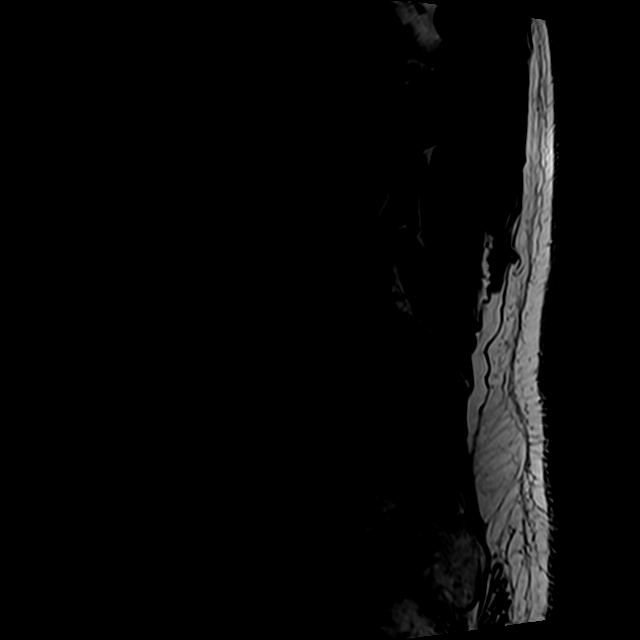

[Series 5: T2 · axial · 4.0mm · 0.78mm/px · z∈[-179,+78]mm · 10 of 46 slices shown (2 of 2)]
[im 4/46]
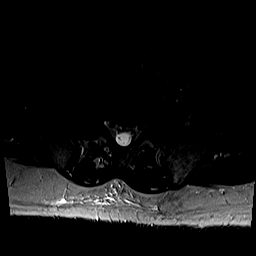
[im 7/46]
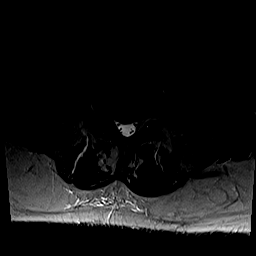
[im 10/46]
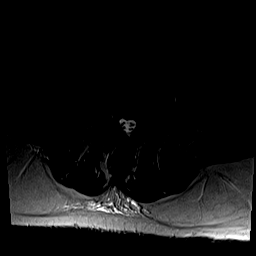
[im 16/46]
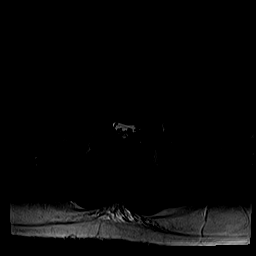
[im 22/46]
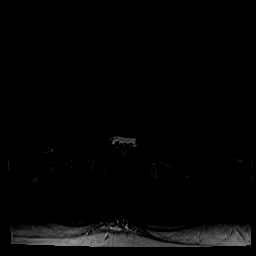
[im 25/46]
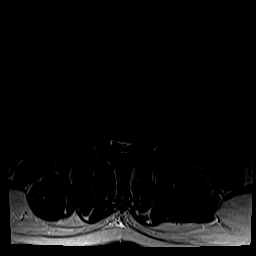
[im 28/46]
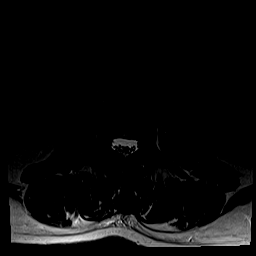
[im 34/46]
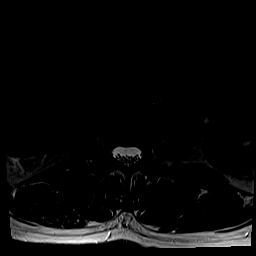
[im 40/46]
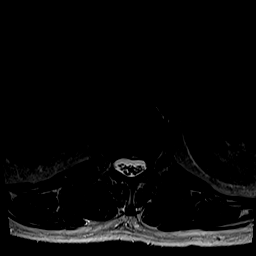
[im 46/46]
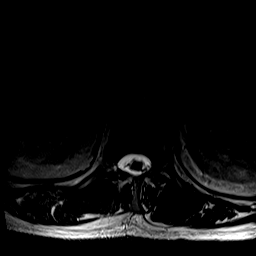

[Series 6: T1 · axial · 4.0mm · 0.39mm/px · z∈[-179,+48]mm · 4 of 46 slices shown (2 of 2)]
[im 4/46]
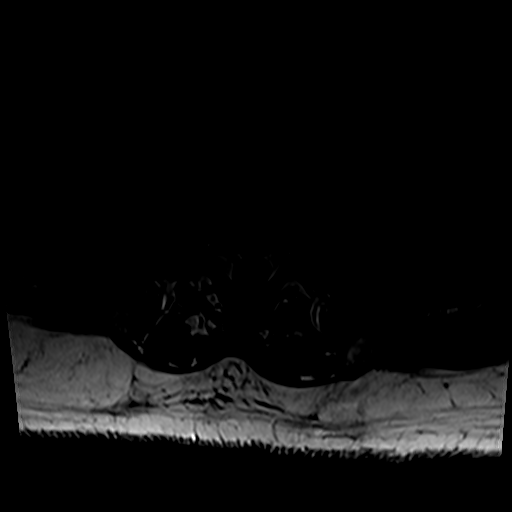
[im 7/46]
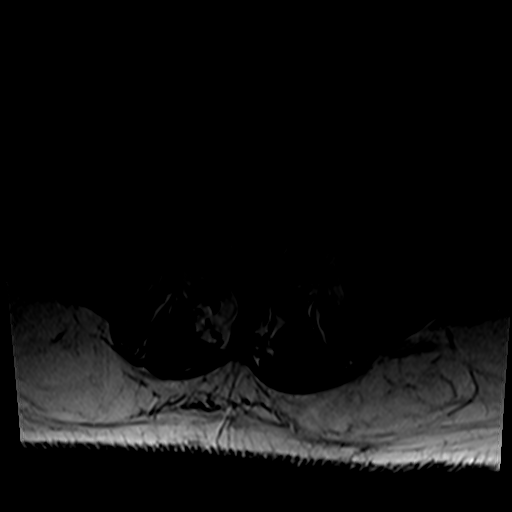
[im 25/46]
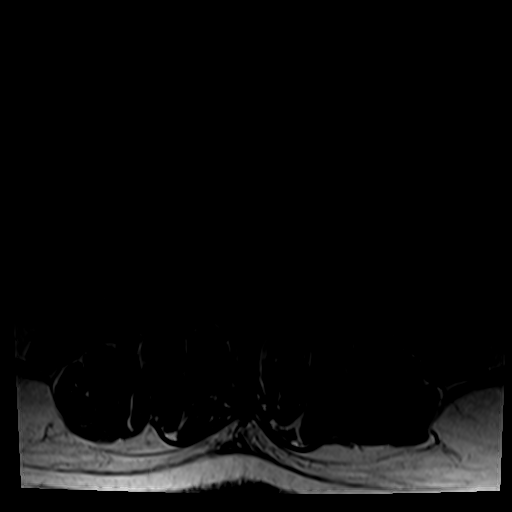
[im 40/46]
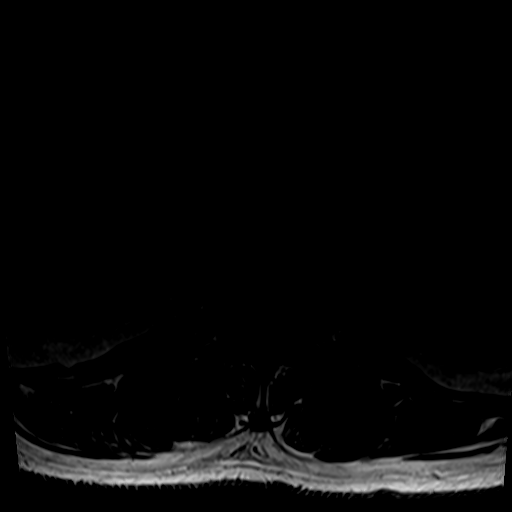

[25 of 48 positions shown; findings below may reference images not displayed]

MR lumbar 11/01/2015 from [REDACTED] [facility]. MRI thoracic and lumbar spine 08/24/2012.
FINDINGS: Segmentation: Transitional lumbosacral anatomy. In correlation with
prior studies, there is a transitional, nearly fully lumbarized S1
segment.This numbering differs from that applied to the previous
outside lumbar MRI.

Alignment:  Stable grade 1 degenerative anterolisthesis at L5-S1.

Vertebrae: A chronic burst fracture at L1 appears stable with mild
osseous retropulsion and no progressive loss of bone height. There
is no significant residual bone marrow edema. No evidence of acute
fracture or pars defect. The visualized sacroiliac joints appear
unremarkable.

Conus medullaris: Extends to the L1 level and appears normal.

Paraspinal and other soft tissues: No significant paraspinal
findings.

Disc levels:

T12-L1: Mild disc bulging. Stable osseous retropulsion related to
the L1 burst fracture without conus compression or foraminal
narrowing.

L1-2: Chronic disc degeneration with annular bulging, endplate
osteophytes and a chronic right paracentral disc extrusion
demonstrating caudal migration. There is stable mild mass effect on
the thecal sac without definite nerve root encroachment.

L2-3: Minimal disc bulging. No spinal stenosis or nerve root
encroachment.

L3-4: Mildly progressive loss of disc height with annular disc
bulging and endplate osteophytes asymmetric to the left. There is
facet and ligamentous hypertrophy, mild spinal stenosis and
asymmetric narrowing of the left lateral recess and left foramen.

L4-5: Relatively preserved disc height with mildly progressive disc
bulging eccentric to the left, mild facet and ligamentous
hypertrophy. Mild narrowing of the left lateral recess.

L5-S1: Chronic degenerative disc disease with loss of disc height,
annular disc bulging and endplate osteophytes asymmetric to the
right. Bilateral facet and ligamentous hypertrophy. The resulting
moderate multifactorial spinal stenosis is similar to the previous
study. Likewise, lateral recess narrowing and moderate to severe
right and mild left foraminal narrowing appear similar.

S1-2: Transitional disc space level demonstrates no significant disc
degeneration. Mild bilateral facet hypertrophy. No significant
spinal stenosis.
IMPRESSION: 1. Transitional lumbosacral anatomy. In correlation with prior
radiographs, there is a transitional, nearly fully lumbarized S1
segment.
2. Stable chronic burst fracture at L1 with associated mild osseous
retropulsion. No acute osseous findings.
3. Stable chronic right paracentral disc extrusion at L1-2 with mild
mass effect on the thecal sac, but no nerve root encroachment.
4. Since 9874, progressive disc degeneration with disc bulging
eccentric to the left at L3-4 and L4-5. At L3-4, there is mild
spinal stenosis with asymmetric narrowing of the left lateral recess
and left foramen.
5. Chronic moderate multifactorial spinal stenosis at L5-S1
secondary to chronic spondylosis, facet and ligamentous hypertrophy.
There is chronic moderate to severe right foraminal narrowing.

## 2023-03-29 ENCOUNTER — Encounter: Payer: Self-pay | Admitting: Family Medicine

## 2023-03-31 ENCOUNTER — Telehealth: Payer: Self-pay

## 2023-03-31 ENCOUNTER — Other Ambulatory Visit: Payer: Self-pay | Admitting: Family Medicine

## 2023-03-31 DIAGNOSIS — I10 Essential (primary) hypertension: Secondary | ICD-10-CM

## 2023-03-31 NOTE — Telephone Encounter (Signed)
Medication Refill -  Most Recent Primary Care Visit:  Provider: Sherlyn Hay  Department: [facility]  Visit Type: OFFICE VISIT  Date: 12/17/2022  Medication: amLODipine (NORVASC) 5 MG tablet [9071   Has the patient contacted their pharmacy? Yes   Is this the correct pharmacy for this prescription? Yes If no, delete pharmacy and type the correct one.  This is the patient's preferred pharmacy:  [id number] [place], [place] - [address] AT [facility] OF [address] [place] [address] Phone: [phone] Fax: [phone]    Has the prescription been filled recently? Yes  Is the patient out of the medication? Yes   Has the patient been seen for an appointment in the last year OR does the patient have an upcoming appointment? Yes  Can we respond through MyChart? Yes  Agent: Please be advised that Rx refills may take up to 3 business days. We ask that you follow-up with your pharmacy.

## 2023-03-31 NOTE — Telephone Encounter (Signed)
Copied from CRM [id number]. Topic: General - Other >> Mar 31, 2023  1:14 PM Phill Myron wrote: Attn: Dr Payton Mccallum donepezil (ARICEPT) 10 MG tablet .Marland Kitchen Pt Honor stated he will no longer be taking this medication.Marland Kitchen

## 2023-04-01 NOTE — Telephone Encounter (Signed)
Rx 02/12/23 #90 1RF- too soon Requested Prescriptions  Pending Prescriptions Disp Refills   amLODipine (NORVASC) 5 MG tablet 90 tablet 1     Cardiovascular: Calcium Channel Blockers 2 Passed - 03/31/2023  3:42 PM      Passed - Last BP in normal range    BP Readings from Last 1 Encounters:  01/22/23 105/70         Passed - Last Heart Rate in normal range    Pulse Readings from Last 1 Encounters:  01/22/23 71         Passed - Valid encounter within last 6 months    Recent Outpatient Visits           3 months ago Primary hypertension   [facility] [place], Monico Blitz, DO   10 months ago Annual physical exam   [facility] Alfredia Ferguson, PA-C   1 year ago Primary hypertension   [facility] [place], [place], PA-C   1 year ago History of stroke with residual deficit   [facility] [place], Lillia Abed, PA-C   1 year ago Alterations of sensations following cerebrovascular accident   [facility] Alfredia Ferguson, [place]

## 2023-04-02 ENCOUNTER — Ambulatory Visit: Payer: Self-pay

## 2023-04-02 ENCOUNTER — Other Ambulatory Visit: Payer: Self-pay | Admitting: Family Medicine

## 2023-04-02 DIAGNOSIS — I10 Essential (primary) hypertension: Secondary | ICD-10-CM

## 2023-04-02 NOTE — Telephone Encounter (Signed)
Requested Prescriptions  Pending Prescriptions Disp Refills   amLODipine (NORVASC) 5 MG tablet [Pharmacy Med Name: AMLODIPINE BESYLATE 5MG  TABLETS] 90 tablet 1    Sig: TAKE 1 TABLET(5 MG) BY MOUTH EVERY MORNING     Cardiovascular: Calcium Channel Blockers 2 Passed - 04/02/2023  8:37 AM      Passed - Last BP in normal range    BP Readings from Last 1 Encounters:  01/22/23 105/70         Passed - Last Heart Rate in normal range    Pulse Readings from Last 1 Encounters:  01/22/23 71         Passed - Valid encounter within last 6 months    Recent Outpatient Visits           3 months ago Primary hypertension   [place] [place], Monico Blitz, DO   10 months ago Annual physical exam   [facility] Alfredia Ferguson, PA-C   1 year ago Primary hypertension   [facility] [place] Edwards, [place], PA-C   1 year ago History of stroke with residual deficit   [facility] [place], Lillia Abed, PA-C   1 year ago Alterations of sensations following cerebrovascular accident   [facility] Alfredia Ferguson, [place]

## 2023-04-02 NOTE — Telephone Encounter (Signed)
Chief Complaint: Need amlodipine Additional Notes:  [facility] called and spoke to [place], Pensions consultant about the refill(s) amlodipine requested. Advised it was sent on 02/12/23 #90/1 refill(s). She says he picked up on 02/12/23 a 30 day supply, so it kicked out. Medication sent in today in a separate refill encounter. Patient called and advised of the above. He says that he wants all of his medications except narcotics to go to [facility]. Advised he will need to have [facility] to request the refills or when he calls in for the next refill to let us know because there are 4 pharmacies on his list of pharmacies. He [facility] [place], but leave the other 3. He verbalized understanding regarding the above.   Summary: Rx clarification   Pt stating he requested refill for amlodipine a couple days ago. Informed pt request denied - too soon to fill, 90 day supply sent 02/12/2023. Pt states he is only taking one pill a day as prescribed and he ran out of the medication last week. Pt states home health nurse came by on Sunday and that is when pt realized he had run out of medication.  Patient requesting call back     Reason for Disposition . [1] Caller requesting a prescription renewal (no refills left), no triage required, AND [2] triager able to renew prescription per department policy  Answer Assessment - Initial Assessment Questions 1. DRUG NAME: "What medicine do you need to have refilled?"     amlodipine 2. REFILLS REMAINING: "How many refills are remaining?" (Note: The label on the medicine or pill bottle will show how many refills are remaining. If there are no refills remaining, then a renewal may be needed.)     0 3. PRESCRIBING HCP: "Who prescribed it?" Reason: If prescribed by specialist, call should be referred to that group.     Dr. Payton Mccallum  Protocols used: Medication Refill and Renewal Call-A-AH

## 2023-04-02 NOTE — Telephone Encounter (Signed)
[facility] called and spoke to [place], Pensions consultant about the refill amlodipine. Advised it was sent on 02/12/23 #90/1 refill. She says the patient picked up a 30 day supply on 02/12/23 and that must have kicked the remaining out of the system because they have no more refills and a request has been sent. Advised I will send this in.

## 2023-04-04 DIAGNOSIS — Z8673 Personal history of transient ischemic attack (TIA), and cerebral infarction without residual deficits: Secondary | ICD-10-CM | POA: Diagnosis not present

## 2023-04-09 ENCOUNTER — Other Ambulatory Visit: Payer: Self-pay | Admitting: Family Medicine

## 2023-04-09 DIAGNOSIS — G894 Chronic pain syndrome: Secondary | ICD-10-CM | POA: Diagnosis not present

## 2023-04-09 DIAGNOSIS — M791 Myalgia, unspecified site: Secondary | ICD-10-CM | POA: Diagnosis not present

## 2023-04-09 DIAGNOSIS — I69351 Hemiplegia and hemiparesis following cerebral infarction affecting right dominant side: Secondary | ICD-10-CM | POA: Diagnosis not present

## 2023-04-09 DIAGNOSIS — Z79891 Long term (current) use of opiate analgesic: Secondary | ICD-10-CM | POA: Diagnosis not present

## 2023-04-09 DIAGNOSIS — I69322 Dysarthria following cerebral infarction: Secondary | ICD-10-CM | POA: Diagnosis not present

## 2023-04-09 DIAGNOSIS — I10 Essential (primary) hypertension: Secondary | ICD-10-CM | POA: Diagnosis not present

## 2023-04-09 DIAGNOSIS — G8921 Chronic pain due to trauma: Secondary | ICD-10-CM | POA: Diagnosis not present

## 2023-04-09 MED ORDER — AMLODIPINE BESYLATE 5 MG PO TABS: 5.0000 mg | ORAL_TABLET | Freq: Every day | ORAL | 0 refills | Status: DC

## 2023-04-09 NOTE — Telephone Encounter (Signed)
Medication Refill -  Most Recent Primary Care Visit:  Provider: Sherlyn Hay  Department: [facility]  Visit Type: OFFICE VISIT  Date: 12/17/2022  Medication: amLODipine (NORVASC) 5 MG tablet   Has the patient contacted their pharmacy? Yes  Is this the correct pharmacy for this prescription? Yes  This is the patient's preferred pharmacy: [facility] - [place], [place] - [address] [place] [address] Phone: [phone] Fax: [phone]  Has the prescription been filled recently? Yes  Is the patient out of the medication? Yes  Has the patient been seen for an appointment in the last year OR does the patient have an upcoming appointment? Yes  Can we respond through MyChart? No  Agent: Please be advised that Rx refills may take up to 3 business days. We ask that you follow-up with your pharmacy.  Xfer script from [place] over to [employer] [facility]

## 2023-04-09 NOTE — Telephone Encounter (Signed)
Patient request Rx to mail order- will forward Requested Prescriptions  Pending Prescriptions Disp Refills   amLODipine (NORVASC) 5 MG tablet 90 tablet 0    Sig: Take 1 tablet (5 mg total) by mouth daily.     Cardiovascular: Calcium Channel Blockers 2 Passed - 04/09/2023  1:23 PM      Passed - Last BP in normal range    BP Readings from Last 1 Encounters:  01/22/23 105/70         Passed - Last Heart Rate in normal range    Pulse Readings from Last 1 Encounters:  01/22/23 71         Passed - Valid encounter within last 6 months    Recent Outpatient Visits           3 months ago Primary hypertension   [facility] [place], Monico Blitz, DO   10 months ago Annual physical exam   [facility] Alfredia Ferguson, PA-C   1 year ago Primary hypertension    [facility] Alfredia Ferguson, PA-C   1 year ago History of stroke with residual deficit   [facility] Alfredia Ferguson, PA-C   1 year ago Alterations of sensations following cerebrovascular accident   [facility] Alfredia Ferguson, [place]

## 2023-04-23 IMAGING — US US ABDOMEN LIMITED
1 series · 15 of 25 positions shown · non-contrast
Comparison: None.

CLINICAL DATA: Right upper quadrant pain

EXAM:
ULTRASOUND ABDOMEN LIMITED RIGHT UPPER QUADRANT

[Series 1: us abdomen limited ruq · 45 acquisitions, 15 frames shown]
[im 1/45]
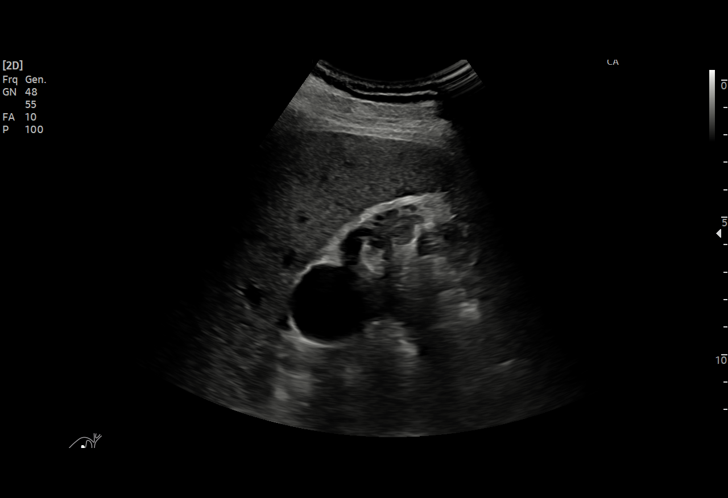
[im 4/45]
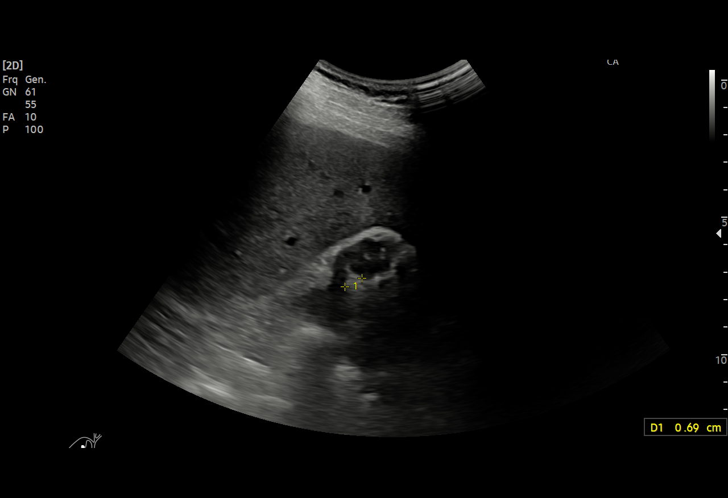
[im 8/45]
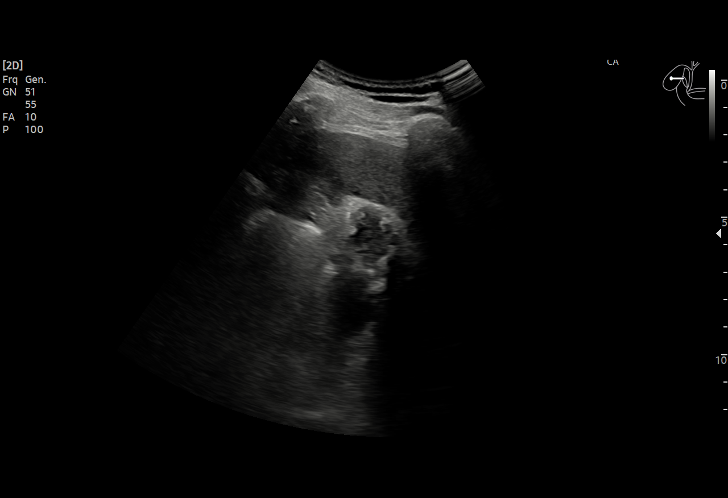
[im 10/45]
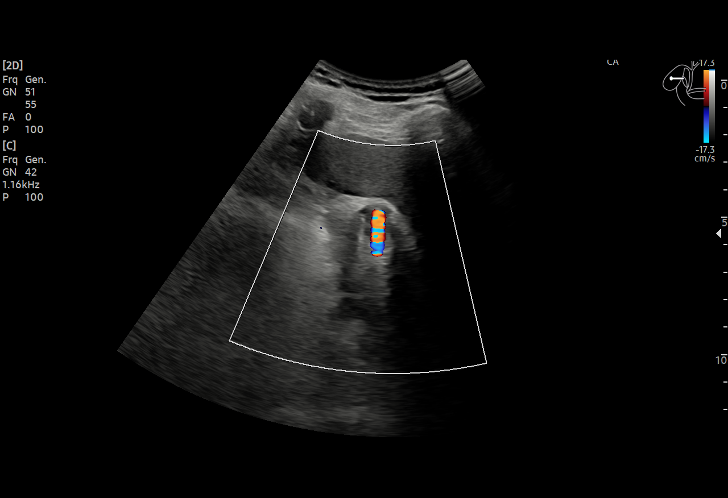
[im 13/45]
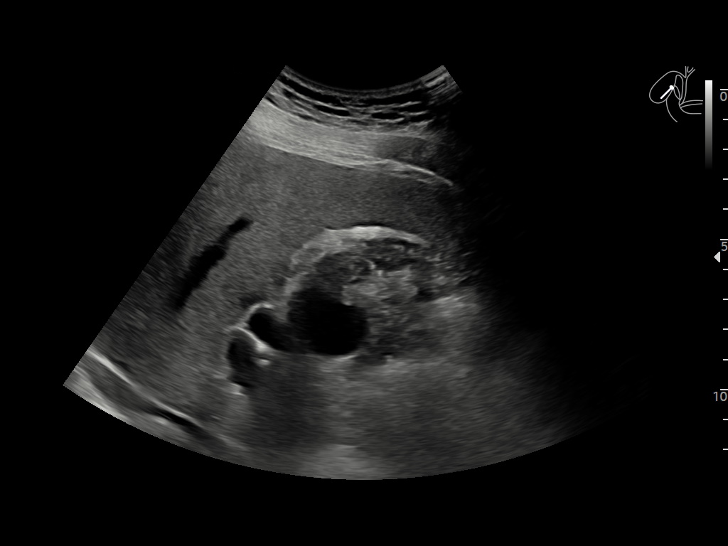
[im 17/45]
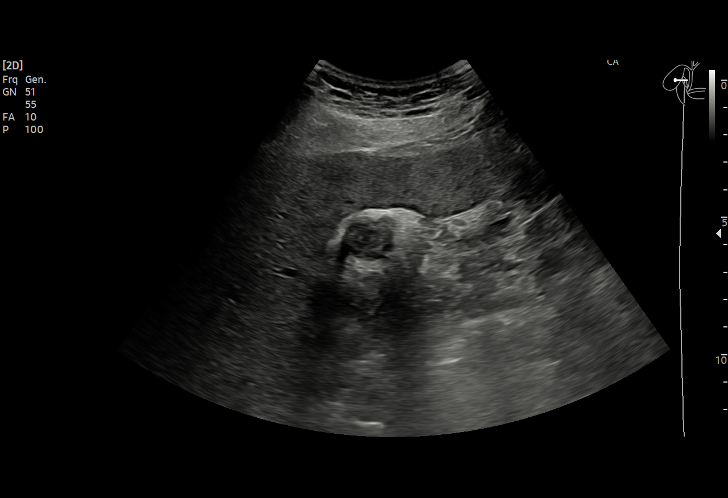
[im 19/45]
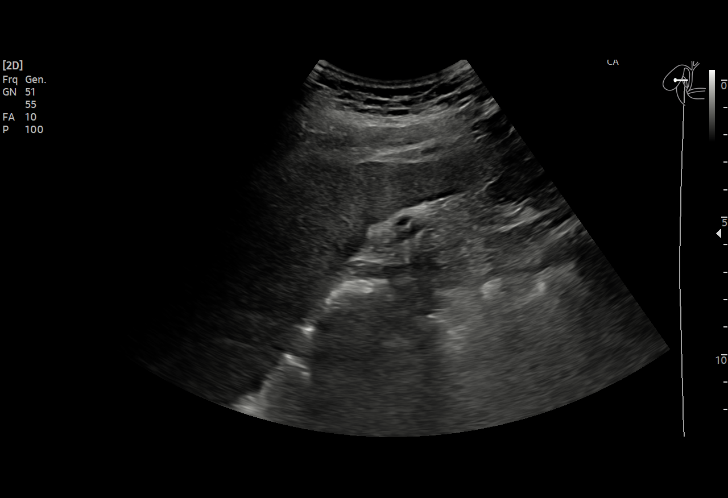
[im 23/45]
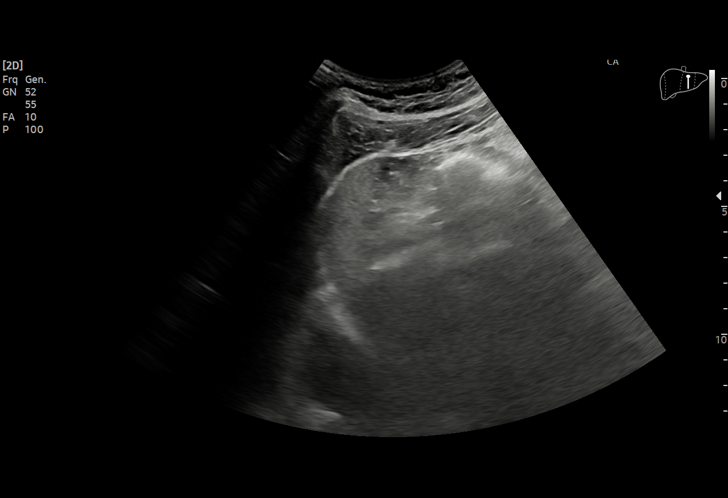
[im 26/45]
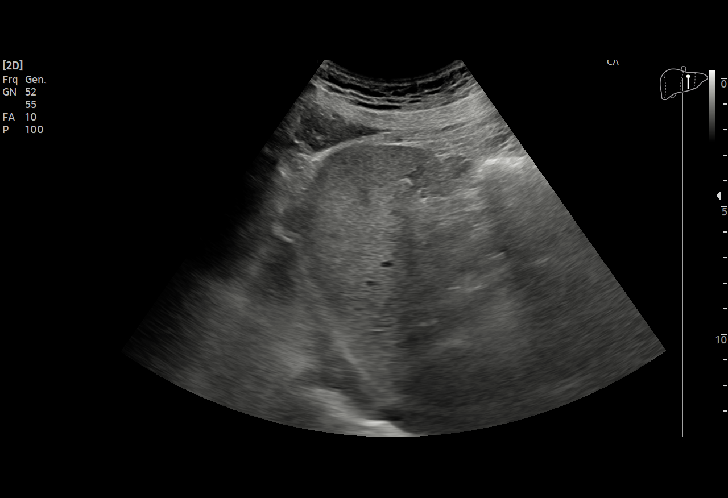
[im 28/45]
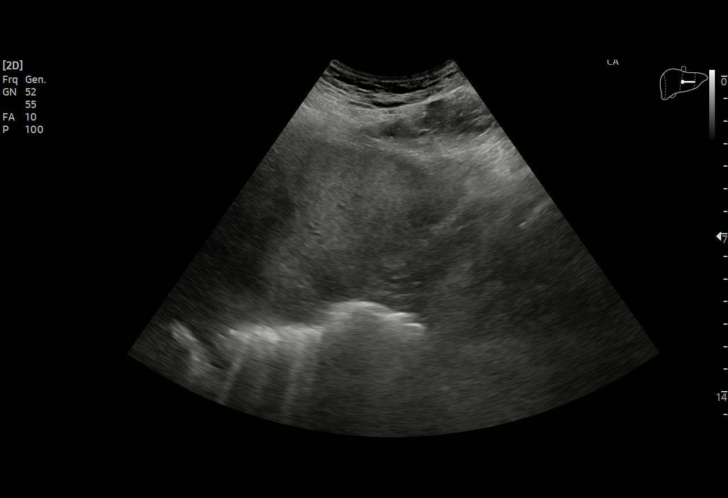
[im 32/45]
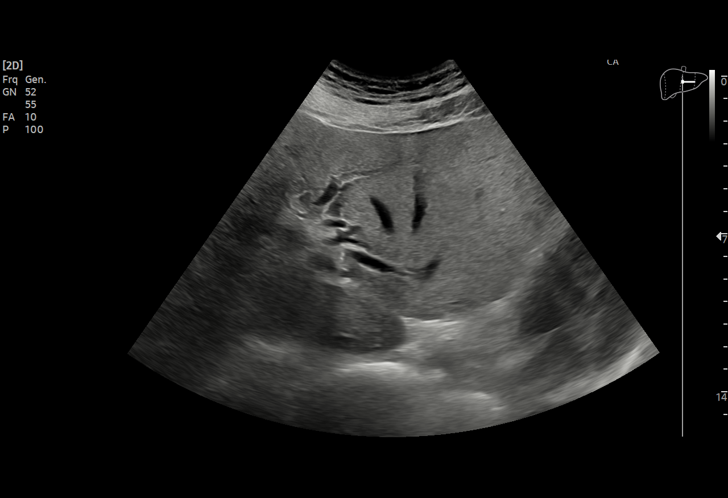
[im 35/45]
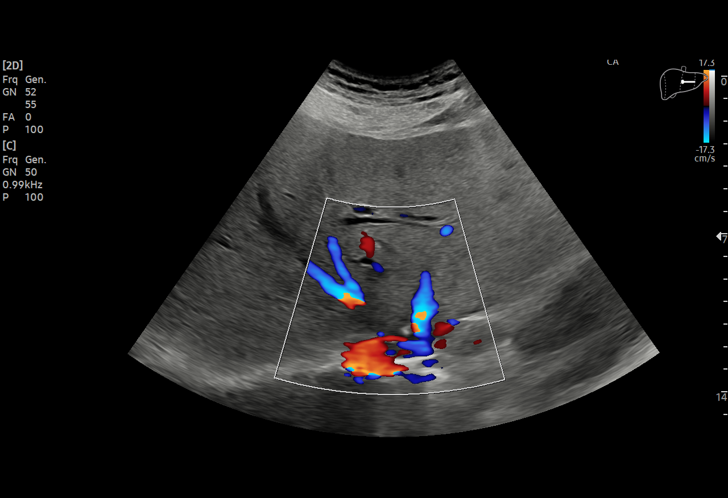
[im 37/45]
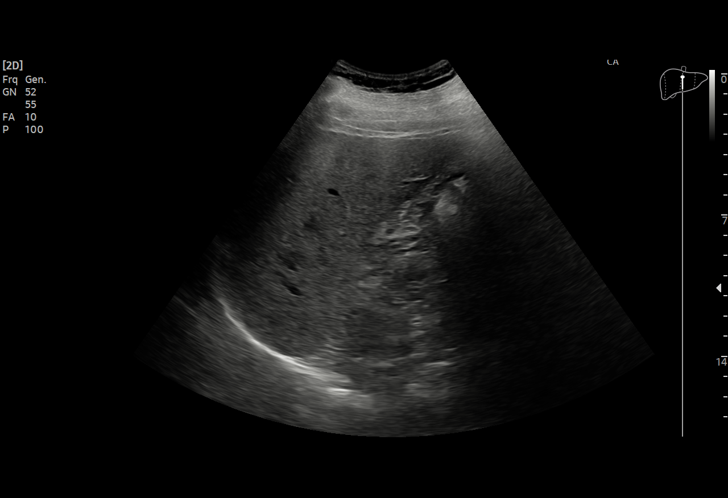
[im 41/45]
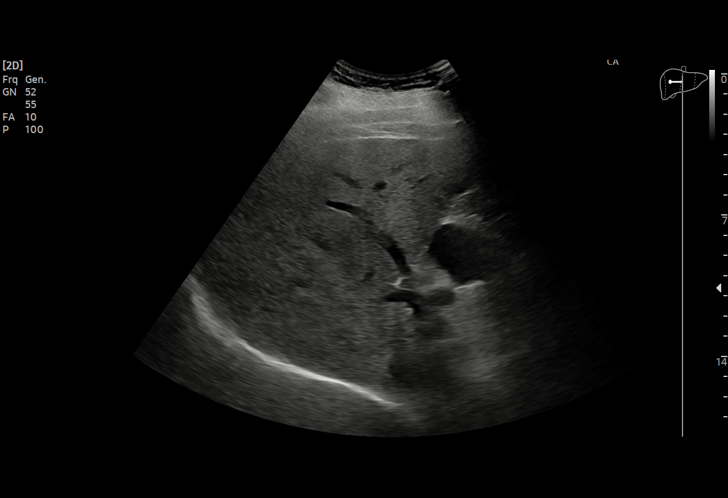
[im 45/45]
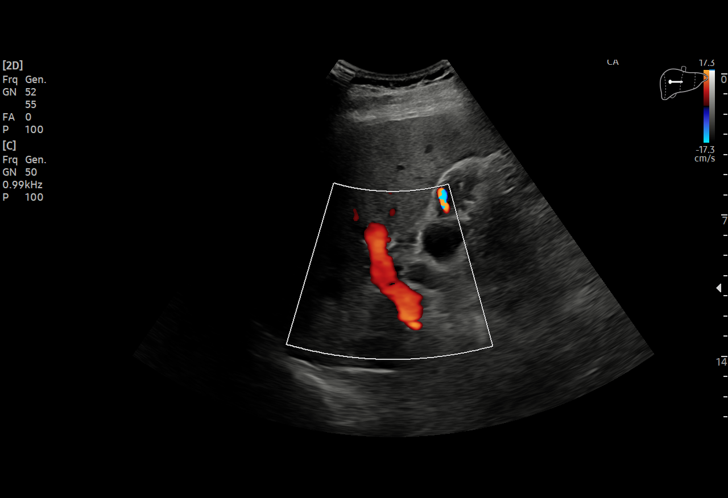

[15 of 25 positions shown; findings below may reference images not displayed]

FINDINGS: Gallbladder:

Sludge and gallstones in the gallbladder. Mild gallbladder wall
thickening. No sonographic Murphy sign noted by sonographer.

Common bile duct:

Diameter: Dilated up to 1.2 cm.

Liver:

No focal lesion identified. Increased parenchymal echogenicity.
Portal vein is patent on color Doppler imaging with normal direction
of blood flow towards the liver.

Other: None.
IMPRESSION: 1. Sludge and gallstones in the gallbladder. Mild gallbladder wall
thickening. Negative sonographic Murphy sign.
2. Dilated common bile duct measuring up to 1.2 cm. No obstructing
calculus is identified by ultrasound. Correlate for evidence of
cholestasis.
3. Hepatic steatosis.

## 2023-04-23 IMAGING — CT CT RENAL STONE PROTOCOL
2 of 4 series · 16 of 46 positions shown, 18 images · non-contrast
Comparison: None.

CLINICAL DATA: Flank pain, kidney stone suspected.

EXAM:
CT ABDOMEN AND PELVIS WITHOUT CONTRAST
TECHNIQUE: Multidetector CT imaging of the abdomen and pelvis was performed
following the standard protocol without IV contrast.

[Series 2: stone full standard · axial · 0.86mm/px · z∈[-104,+446]mm · 13 of 122 slices shown, 15 images]
[im 6/122  soft-tissue]
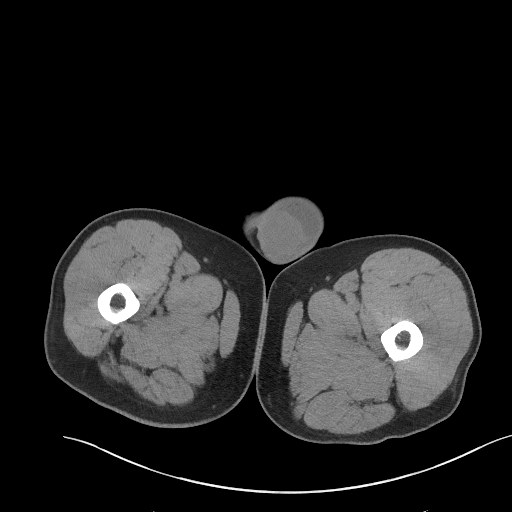
[im 6/122  bone]
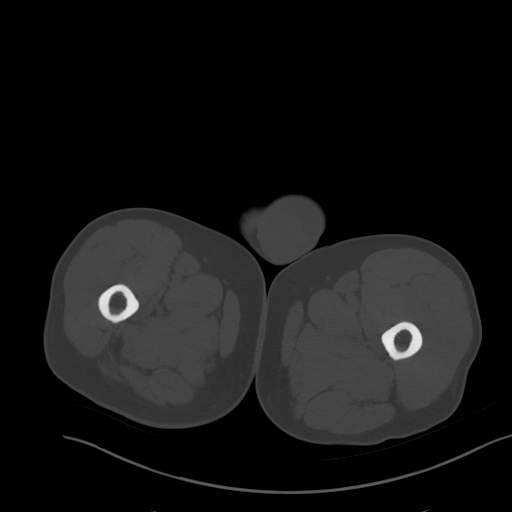
[im 16/122  soft-tissue]
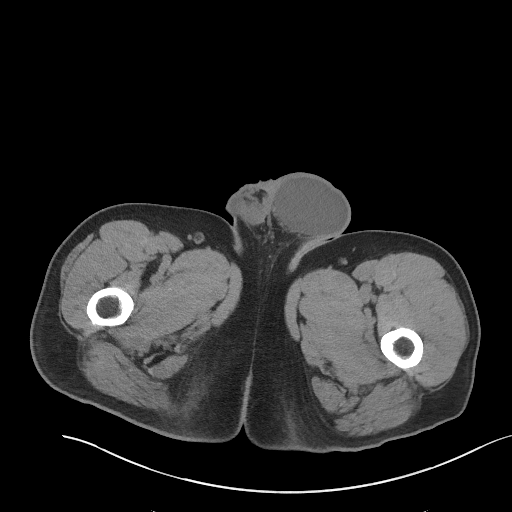
[im 26/122  soft-tissue]
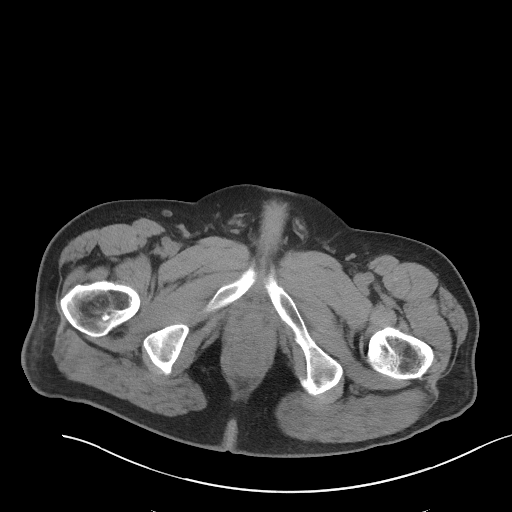
[im 36/122  soft-tissue]
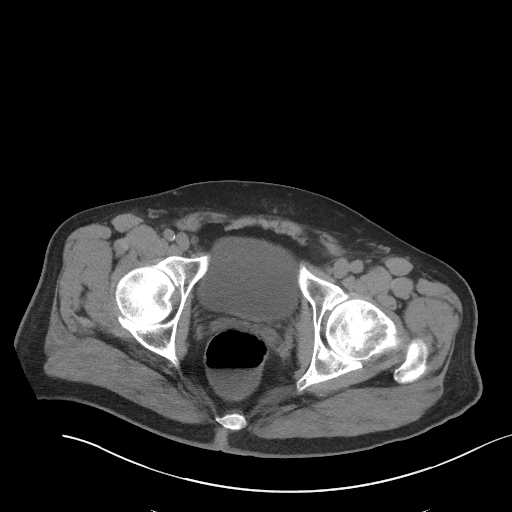
[im 41/122  soft-tissue]
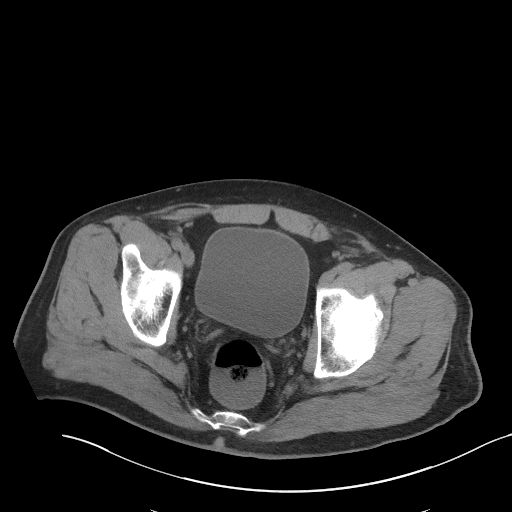
[im 51/122  soft-tissue]
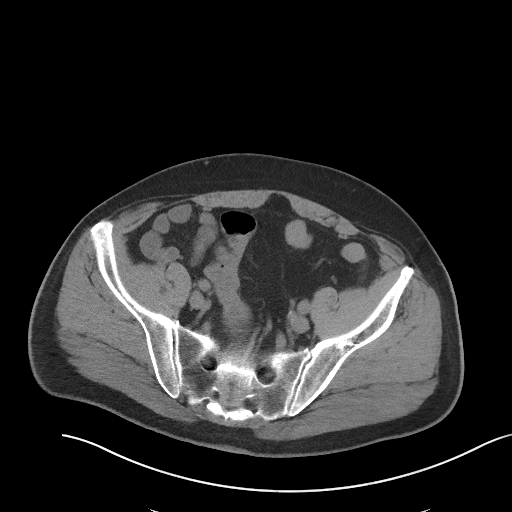
[im 61/122  soft-tissue]
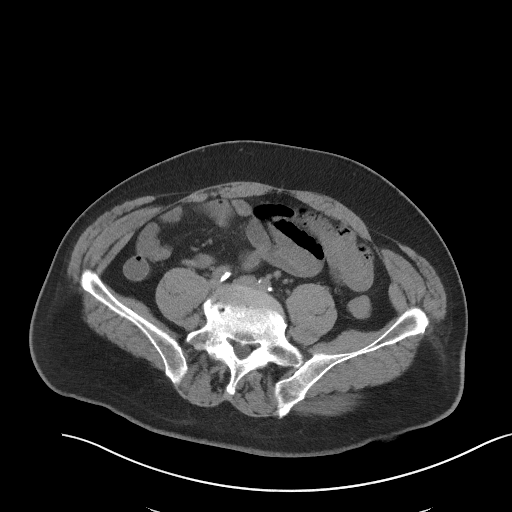
[im 71/122  soft-tissue]
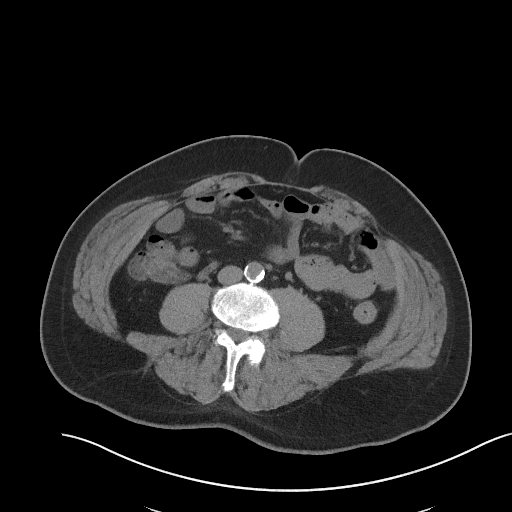
[im 81/122  soft-tissue]
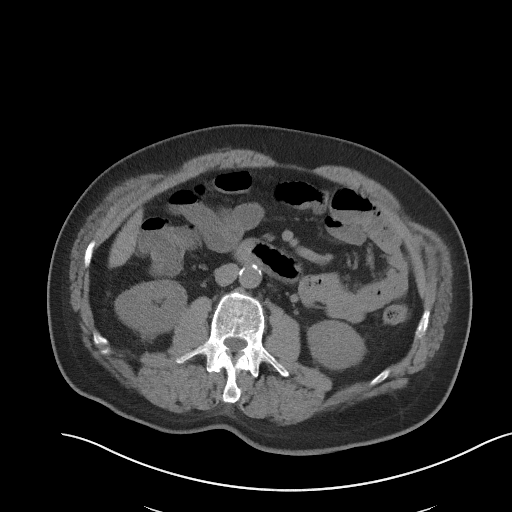
[im 81/122  bone]
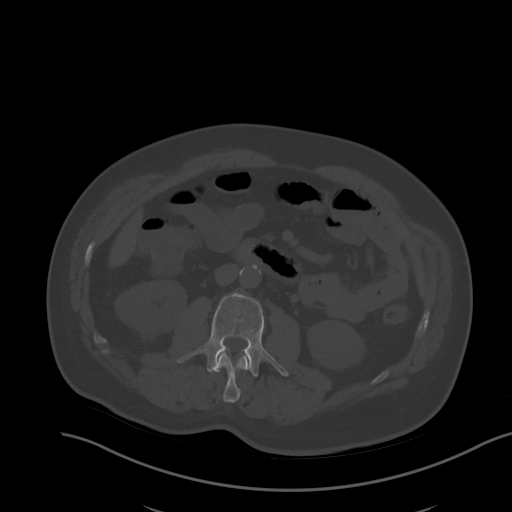
[im 86/122  soft-tissue]
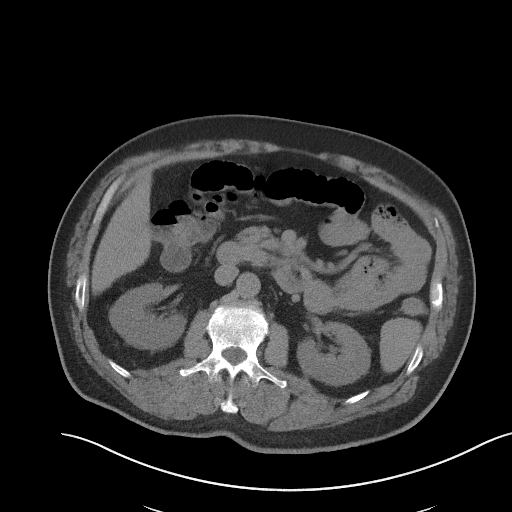
[im 96/122  soft-tissue]
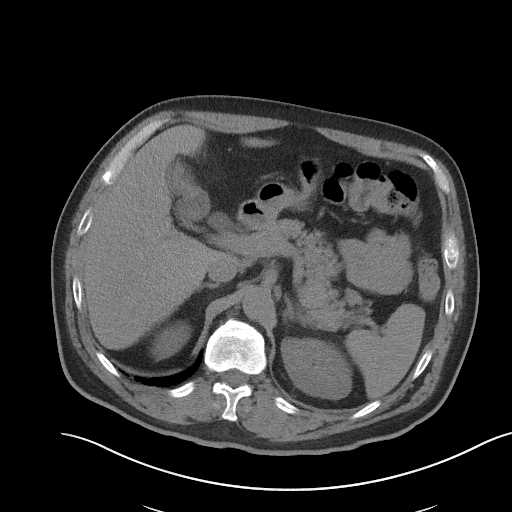
[im 106/122  soft-tissue]
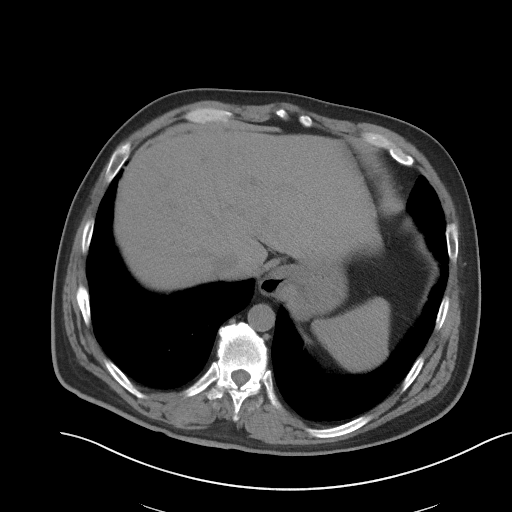
[im 116/122  soft-tissue]
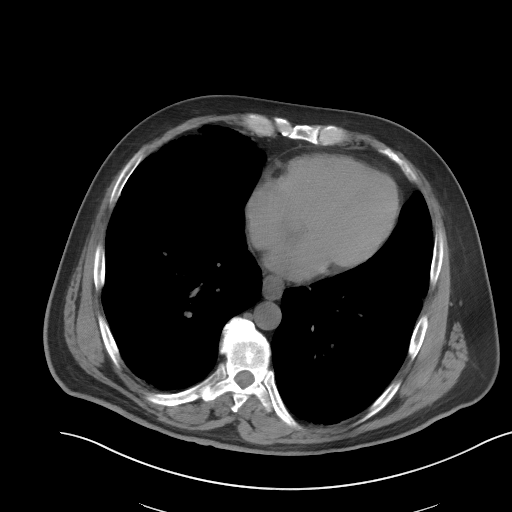

[Series 5: coronal · coronal · 0.94mm/px · 3 of 160 slices shown]
[im 54/160  soft-tissue]
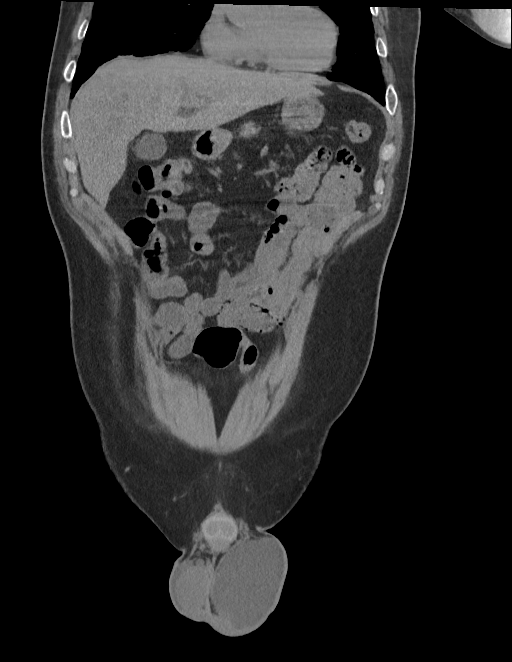
[im 71/160  soft-tissue]
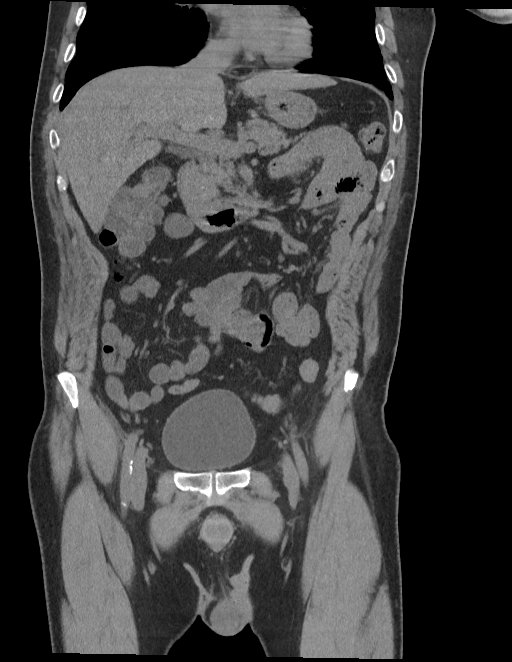
[im 89/160  soft-tissue]
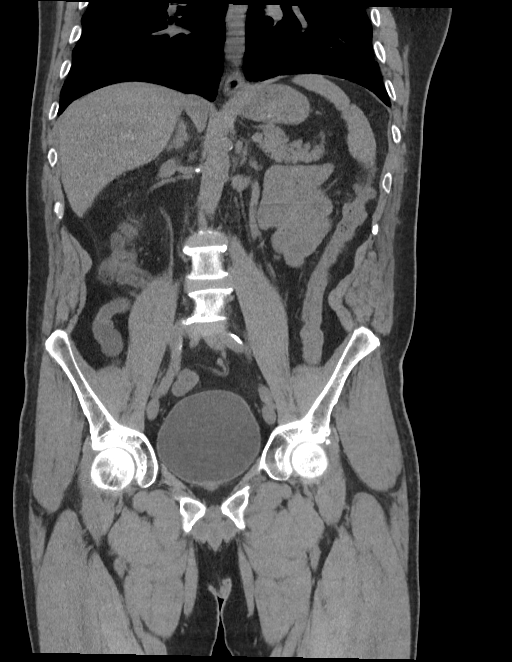

[16 of 46 positions shown; findings below may reference images not displayed]

FINDINGS: Lower chest: Scattered peripheral densities in the lower lungs are
suggestive for chronic changes and likely fibrotic changes.
Prominent fibrotic changes involving the periphery of the right
middle lobe. No pleural effusions.

Hepatobiliary: Normal appearance of the liver. Mild stranding around
the gallbladder fundus best seen on the coronal sequence, sequence 5
image 46. Tapering of the extrahepatic bile duct.

Pancreas: Unremarkable. No pancreatic ductal dilatation or
surrounding inflammatory changes.

Spleen: Normal in size without focal abnormality.

Adrenals/Urinary Tract: Normal appearance of the adrenal glands.
Normal appearance of the urinary bladder. Normal appearance both
kidneys without stones or hydronephrosis. 1.5 cm exophytic cyst in
the left kidney.

Stomach/Bowel: Small hiatal hernia. Rectum is distended with stool.
Normal appearance of the appendix without inflammatory changes.
Small amount of high-density material within the appendix. No
evidence for bowel distention or obstruction.

Vascular/Lymphatic: Aortic atherosclerosis. No enlarged abdominal or
pelvic lymph nodes.

Reproductive: Normal appearance of the prostate. There is evidence
for bilateral hydroceles. Left hydrocele is large for size.

Other: Negative for free fluid. Small umbilical hernia containing
fat.

Musculoskeletal: Again noted is a T12 vertebral body compression
fracture and minimally changed in appearance since MRI on
03/06/2021.
IMPRESSION: 1. Mild stranding around the gallbladder fundus and findings raise
concern for inflammation in this area. Recommend right upper
quadrant ultrasound for better characterization of the gallbladder.
2. Negative for kidney stones or hydronephrosis.
3. Evidence for scarring and fibrotic changes at the lung bases
particularly the right middle lobe.
4. Evidence for bilateral hydroceles, left side larger than right.
5.  Aortic Atherosclerosis (QSGCN-CGS.S).

## 2023-04-29 ENCOUNTER — Telehealth: Payer: Self-pay

## 2023-04-29 DIAGNOSIS — R252 Cramp and spasm: Secondary | ICD-10-CM

## 2023-04-29 MED ORDER — TIZANIDINE HCL 4 MG PO TABS: 6.0000 mg | ORAL_TABLET | Freq: Four times a day (QID) | ORAL | 4 refills | Status: DC

## 2023-04-29 NOTE — Telephone Encounter (Signed)
 [facility] called and cancelled. Remaining refills sent to [facility]. Change confirmed with patient.

## 2023-04-30 ENCOUNTER — Encounter: Payer: Self-pay | Admitting: Physical Medicine & Rehabilitation

## 2023-04-30 ENCOUNTER — Encounter: Payer: 59 | Attending: Physical Medicine & Rehabilitation | Admitting: Physical Medicine & Rehabilitation

## 2023-04-30 VITALS — BP 116/80 | HR 77 | Ht 73.0 in | Wt 228.0 lb

## 2023-04-30 DIAGNOSIS — G8111 Spastic hemiplegia affecting right dominant side: Secondary | ICD-10-CM | POA: Insufficient documentation

## 2023-04-30 NOTE — Progress Notes (Signed)
 Subjective:    Patient ID: David Reeves, male    DOB: 1970-04-26, 53 y.o.   MRN: [id number]  HPI  [name] is here in follow up of his right spastic hemiparesis. We have performed 2 botox  injections so far. At his last visit we injected his right finger and wrist flexors with 200u. He has had good results with them. Unfortunately, he has not received insurance approval. He has been working with [facility] about facilitating authorization.   He is doing some work on his own to improve ROM and strength.   Tim reports no new health issues. He deals with chronic low back pain which tends to be more prominent in winter.     Pain Inventory Average Pain 3 Pain Right Now 0 My pain is intermittent, dull, and aching  LOCATION OF PAIN  lower back pain & right arm numb  BOWEL Number of stools per week: 3-4 Oral laxative use No    BLADDER Normal   Mobility walk without assistance ability to climb steps?  yes do you drive?  yes Do you have any goals in this area?  yes  Function disabled: date disabled 2019  Neuro/Psych weakness numbness spasms  Prior Studies Any changes since last visit?  no  Physicians involved in your care Any changes since last visit?  no   Family History  Problem Relation Age of Onset   Diabetes Mother    Diabetes Maternal Uncle    Diabetes Maternal Grandmother    Heart disease Maternal Grandmother    Social History   Socioeconomic History   Marital status: Single    Spouse name: mikki    Number of children: 1   Years of education: Not on file   Highest education level: Some college, no degree  Occupational History   Not on file  Tobacco Use   Smoking status: Never   Smokeless tobacco: Never   Tobacco comments:    cbd    Vaping Use   Vaping status: Never Used  Substance and Sexual Activity   Alcohol  use: Not Currently    Comment: last used 12-22-17   Drug use: Not Currently    Types: Marijuana    Comment: last used in 2023    Sexual activity: Not Currently  Other Topics Concern   Not on file  Social History Narrative   Not on file   Social Drivers of Health   Financial Resource Strain: Low Risk  (07/30/2022)   Overall Financial Resource Strain (CARDIA)    Difficulty of Paying Living Expenses: Not hard at all  Food Insecurity: No Food Insecurity (07/30/2022)   Hunger Vital Sign    Worried About Running Out of Food in the Last Year: Never true    Ran Out of Food in the Last Year: Never true  Transportation Needs: No Transportation Needs (07/30/2022)   PRAPARE - Administrator, Civil Service (Medical): No    Lack of Transportation (Non-Medical): No  Physical Activity: Insufficiently Active (07/30/2022)   Exercise Vital Sign    Days of Exercise per Week: 4 days    Minutes of Exercise per Session: 20 min  Stress: No Stress Concern Present (07/30/2022)   [employer] of Occupational Health - Occupational Stress Questionnaire    Feeling of Stress : Not at all  Social Connections: Socially Isolated (07/30/2022)   Social Connection and Isolation Panel [NHANES]    Frequency of Communication with Friends and Family: More than three times a week  Frequency of Social Gatherings with Friends and Family: Once a week    Attends Religious Services: Never    Database Administrator or Organizations: No    Attends Engineer, Structural: Never    Marital Status: Divorced   Past Surgical History:  Procedure Laterality Date   CYSTECTOMY     head   HERNIA REPAIR Bilateral [phone]   [facility]   SPERMATOCELECTOMY Left 08/05/2022   Procedure: SPERMATOCELECTOMY;  Surgeon: Penne Knee, MD;  Location: [facility] ORS;  Service: Urology;  Laterality: Left;   TOE SURGERY Right 2007   Past Medical History:  Diagnosis Date   Anxiety    Aortic atherosclerosis (HCC)    Aphasia as late effect of cerebrovascular accident (CVA)    Back pain 04/22/2012   Bone spur    Bulging disc 04/22/2012   Chronic pain syndrome     a.) on buprenorphrine   Cognitive dysfunction    Degenerative disc disease    Erectile dysfunction    GERD (gastroesophageal reflux disease)    Hemorrhoids    History of substance use    a.) THC + cocaine + ETOH   HLD (hyperlipidemia)    HTN (hypertension)    Hydrocele, bilateral    Insomnia    Ischemic cerebrovascular accident (CVA) (HCC) 11/08/2020   a.) MRI brain 11/08/2020 --> extensive hypodensities and loss of gray-white differentiation in the left ACA and left MCA territories with slight hyperdense left MCA and likely hyperdense left distal ICA; etiology felt to be secondary to cocaine use   Lumbar stenosis with neurogenic claudication    Osteoarthritis    Panic anxiety syndrome    Panic disorder    Right inguinal hernia    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    a.) on BZO (diazepam ) PRN   Spermatocele (LEFT)    Taking multiple medications for chronic disease    Ht 6' 1 (1.854 m)   Wt 228 lb (103.4 kg)   BMI 30.08 kg/m   Opioid Risk Score:   Fall Risk Score:  `1  Depression screen PHQ 2/9     01/22/2023   10:14 AM 12/17/2022    1:48 PM 09/18/2022    9:15 AM 06/12/2022    9:25 AM 05/29/2022   10:33 AM 05/01/2022   11:19 AM 12/27/2021    4:06 PM  Depression screen PHQ 2/9  Decreased Interest 0 0 0 0 0 0 0  Down, Depressed, Hopeless 0 0 0 0 0 0 0  PHQ - 2 Score 0 0 0 0 0 0 0  Altered sleeping    0 1 0 2  Tired, decreased energy    0 0 0 0  Change in appetite    0 0 0 2  Feeling bad or failure about yourself     0 0 0 0  Trouble concentrating    0 0 0 0  Moving slowly or fidgety/restless    0 0 0 0  Suicidal thoughts    0 0 0 0  PHQ-9 Score    0 1 0 4  Difficult doing work/chores     Not difficult at all Not difficult at all Somewhat difficult    Review of Systems  Musculoskeletal:  Positive for back pain.       Lower back pain  Neurological:  Positive for weakness and numbness.       Right arm numbness, spasms  All other systems reviewed and are negative.  Objective:   Physical Exam  General: No acute distress HEENT: NCAT, EOMI, oral membranes moist Cards: reg rate  Chest: normal effort Abdomen: Soft, NT, ND Skin: dry, intact Extremities: no edema Psych: pleasant and appropriate  Skin: intact Neuro: Patient is alert and oriented x 3.  Demonstrates mild facial droop on the right and dysarthria but speech is clearly intelligible.  Cognitively he demonstrates good insight and awareness and functional memory.  Right upper extremity is 3 out of 5 with shoulder abduction 4 to 4+ out of 5 with elbow flexion, 4 out of 5 elbow extension.  Wrist and hand is 3+ out of 5, can squeeze my hand, almost make a fist.  Right lower extremity grossly 4 to 4+ out of 5 throughout.  Sensory remains 1 out of 2 right arm and leg.  Tone is trace to absent throughout RUE.   Deep tendon reflexes are 3+ in the right upper extremity and 2+ right lower extremity.    Musculoskeletal: no pain         Assessment & Plan:  Pleasant 53 year old male with history of previous left CVA and July 2022 with residual right sided spastic hemiparesis hemisensory loss.  Right upper extremity is most affected. 2.  Chronic low back pain followed by pain clinic     Plan: Continue tizanidine  6 mg 4 times daily over 5 days.   Check LFT's at some point this year Hold on further botox  at this point. His spasticity seems controlled [facility] process still pending. I advised him to let me know if there is anything we can do to help. Continue with HEP as he's doing     Patient will follow up with me in 4 months. .20 minutes face to face patient care time were spent during this visit. All questions were encouraged and answered.  SABRA

## 2023-04-30 NOTE — Patient Instructions (Signed)
 ALWAYS FEEL FREE TO CALL OUR OFFICE WITH ANY PROBLEMS OR QUESTIONS [phone])  **PLEASE NOTE** ALL MEDICATION REFILL REQUESTS (INCLUDING CONTROLLED SUBSTANCES) NEED TO BE MADE AT LEAST 7 DAYS PRIOR TO REFILL BEING DUE. ANY REFILL REQUESTS INSIDE THAT TIME FRAME MAY RESULT IN DELAYS IN RECEIVING YOUR PRESCRIPTION.

## 2023-05-07 ENCOUNTER — Ambulatory Visit (INDEPENDENT_AMBULATORY_CARE_PROVIDER_SITE_OTHER): Payer: 59 | Admitting: Emergency Medicine

## 2023-05-07 VITALS — Ht 73.0 in | Wt 225.0 lb

## 2023-05-07 DIAGNOSIS — Z0000 Encounter for general adult medical examination without abnormal findings: Secondary | ICD-10-CM | POA: Diagnosis not present

## 2023-05-07 NOTE — Progress Notes (Signed)
 Subjective:   David Reeves is a 53 y.o. male who presents for Medicare Annual/Subsequent preventive examination.  Visit Complete: Virtual I connected with  Derwood Flor on 05/07/23 by a video and audio enabled telemedicine application and verified that I am speaking with the correct person using two identifiers.  Patient Location: Home  Provider Location: Home Office  I discussed the limitations of evaluation and management by telemedicine. The patient expressed understanding and agreed to proceed.  Vital Signs: Because this visit was a virtual/telehealth visit, some criteria may be missing or patient reported. Any vitals not documented were not able to be obtained and vitals that have been documented are patient reported.  Patient Medicare AWV questionnaire was completed by the patient on 05/04/23; I have confirmed that all information answered by patient is correct and no changes since this date.  Cardiac Risk Factors include: male gender;hypertension;dyslipidemia     Objective:    Today's Vitals   05/07/23 1002 05/07/23 1004  Weight: 225 lb (102.1 kg)   Height: 6\' 1"  (1.854 m)   PainSc:  3    Body mass index is 29.69 kg/m.     05/07/2023   10:20 AM 08/05/2022   10:24 AM 07/31/2022   11:28 AM 05/01/2022   11:20 AM 05/30/2021    9:48 AM 05/23/2021    8:46 AM 04/04/2021    4:36 PM  Advanced Directives  Does Patient Have a Medical Advance Directive? No No No No No No No  Does patient want to make changes to medical advance directive?     No - Patient declined Yes (Inpatient - patient requests chaplain consult to change a medical advance directive)   Would patient like information on creating a medical advance directive? Yes (MAU/Ambulatory/Procedural Areas - Information given) No - Patient declined  No - Patient declined No - Patient declined      Current Medications (verified) Outpatient Encounter Medications as of 05/07/2023  Medication Sig   acetaminophen   (TYLENOL ) 500 MG tablet Take 1,000 mg by mouth every 6 (six) hours as needed for mild pain.   amLODipine  (NORVASC ) 5 MG tablet Take 1 tablet (5 mg total) by mouth daily.   Ascorbic Acid (VITAMIN C PO) Take 1 tablet by mouth daily at 6 (six) AM.   aspirin  EC 81 MG tablet Take 81 mg by mouth daily. Swallow whole.   atorvastatin  (LIPITOR) 80 MG tablet Take 1 tablet (80 mg total) by mouth daily.   buprenorphine (BUTRANS) 20 MCG/HR PTWK Place 1 patch onto the skin once a week.   calcium  carbonate (TUMS - DOSED IN MG ELEMENTAL CALCIUM ) 500 MG chewable tablet Chew 1 tablet by mouth as needed for indigestion or heartburn.   celecoxib  (CELEBREX ) 100 MG capsule TAKE 1 CAPSULE BY MOUTH TWICE  DAILY   cetirizine  (ZYRTEC ) 10 MG tablet Take 1 tablet (10 mg total) by mouth daily.   Cholecalciferol  (VITAMIN D3) 2000 units TABS Take 1 tablet by mouth daily at 6 (six) AM.   diazepam  (VALIUM ) 10 MG tablet Take 10 mg by mouth 2 (two) times daily.   donepezil  (ARICEPT ) 10 MG tablet TAKE 1 TABLET BY MOUTH AT  BEDTIME   famotidine  (PEPCID ) 20 MG tablet Take 1 tablet (20 mg total) by mouth daily.   gabapentin  (NEURONTIN ) 400 MG capsule TAKE 1 CAPSULE BY MOUTH 3 TIMES  DAILY   Lactobacillus (REJUVAFLOR) CAPS Take 1 capsule by mouth daily.   lidocaine  (LIDODERM ) 5 % 1 patch daily. Lower back and  upper shoulder   Multiple Vitamin (MULTIVITAMIN) tablet Take 1 tablet by mouth daily.   prazosin  (MINIPRESS ) 1 MG capsule TAKE 2 CAPSULES BY MOUTH AT  BEDTIME DO NOT TAKE WITH AMBIEN    SODIUM FLUORIDE 5000 PPM 1.1 % GEL dental gel PLEASE SEE ATTACHED FOR DETAILED DIRECTIONS   tiZANidine  (ZANAFLEX ) 4 MG tablet Take 1.5 tablets (6 mg total) by mouth 4 (four) times daily.   buprenorphine (BUTRANS) 15 MCG/HR 1 patch once a week. Thursdays (Patient not taking: Reported on 05/07/2023)   Calcium  Carbonate Antacid (ALKA-SELTZER ANTACID PO) Take 1 tablet by mouth as needed. (Patient not taking: Reported on 05/07/2023)   Calcium  Carbonate  Antacid 200 MG TBDP Take by mouth. (Patient not taking: Reported on 05/07/2023)   diclofenac  (VOLTAREN ) 50 MG EC tablet  (Patient not taking: Reported on 05/07/2023)   Omega-3 Fatty Acids (FISH OIL PO) Take 1 tablet by mouth daily at 6 (six) AM. (Patient not taking: Reported on 05/07/2023)   No facility-administered encounter medications on file as of 05/07/2023.    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   HLD (hyperlipidemia)    HTN (hypertension)    Hydrocele, bilateral    Insomnia    Ischemic cerebrovascular accident (CVA) (HCC) 11/08/2020   a.) MRI brain 11/08/2020 --> extensive hypodensities and loss of gray-white differentiation in the left ACA and left MCA territories with slight hyperdense left MCA and likely hyperdense left distal ICA; etiology felt to be secondary to cocaine use   Lumbar stenosis with neurogenic claudication    Osteoarthritis    Panic anxiety syndrome    Panic disorder    Right inguinal hernia    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    a.) on BZO (diazepam ) PRN   Spermatocele (LEFT)    Taking multiple medications for chronic disease    Past Surgical History:  Procedure Laterality Date   CYSTECTOMY     head   HERNIA REPAIR Bilateral [id number]   [facility]   SPERMATOCELECTOMY Left 08/05/2022   Procedure: SPERMATOCELECTOMY;  Surgeon: Dustin Gimenez, MD;  Location: [facility] ORS;   Service: Urology;  Laterality: Left;   TOE SURGERY Right 2007   Family History  Problem Relation Age of Onset   Diabetes Mother    Other Father        unknown medical history   Diabetes Maternal Uncle    Diabetes Maternal Grandmother    Heart disease Maternal Grandmother    Social History   Socioeconomic History   Marital status: Divorced    Spouse name: Not on file   Number of children: 1   Years of education: Not on file   Highest education level: Some college, no degree  Occupational History   Occupation: disability  Tobacco Use   Smoking status: Never   Smokeless tobacco: Never   Tobacco comments:  cbd    Vaping Use   Vaping status: Never Used  Substance and Sexual Activity   Alcohol  use: Not Currently    Comment: last used 10/2020   Drug use: Not Currently    Types: Marijuana    Comment: last used in 2023   Sexual activity: Not Currently    Birth control/protection: Abstinence, None  Other Topics Concern   Not on file  Social History Narrative   Not on file   Social Drivers of Health   Financial Resource Strain: Low Risk  (05/07/2023)   Overall Financial Resource Strain (CARDIA)    Difficulty of Paying Living Expenses: Not hard at all  Food Insecurity: No Food Insecurity (05/07/2023)   Hunger Vital Sign    Worried About Running Out of Food in the Last Year: Never true    Ran Out of Food in the Last Year: Never true  Transportation Needs: No Transportation Needs (05/07/2023)   PRAPARE - Administrator, Civil Service (Medical): No    Lack of Transportation (Non-Medical): No  Physical Activity: Inactive (05/07/2023)   Exercise Vital Sign    Days of Exercise per Week: 0 days    Minutes of Exercise per Session: 0 min  Stress: No Stress Concern Present (05/07/2023)   [employer] of Occupational Health - Occupational Stress Questionnaire    Feeling of Stress : Not at all  Social Connections: Socially Isolated (05/07/2023)   Social Connection  and Isolation Panel [NHANES]    Frequency of Communication with Friends and Family: More than three times a week    Frequency of Social Gatherings with Friends and Family: Once a week    Attends Religious Services: Never    Database administrator or Organizations: No    Attends Engineer, structural: Never    Marital Status: Divorced    Tobacco Counseling Counseling given: Not Answered Tobacco comments: cbd     Clinical Intake:  Pre-visit preparation completed: Yes  Pain : 0-10 Pain Score: 3  Pain Type: Chronic pain Pain Location: Back Pain Descriptors / Indicators: Aching     BMI - recorded: 29.69 Nutritional Status: BMI 25 -29 Overweight Nutritional Risks: None Diabetes: No  How often do you need to have someone help you when you read instructions, pamphlets, or other written materials from your doctor or pharmacy?: 1 - Never  Interpreter Needed?: No  Information entered by :: Jaunita Messier, CMA   Activities of Daily Living    05/07/2023   10:06 AM 05/04/2023   10:26 AM  In your present state of health, do you have any difficulty performing the following activities:  Hearing? 0 0  Vision? 0 0  Difficulty concentrating or making decisions? 0 0  Walking or climbing stairs? 0 0  Dressing or bathing? 0 0  Doing errands, shopping? 0 0  Preparing Food and eating ? N N  Using the Toilet? N N  In the past six months, have you accidently leaked urine? N N  Do you have problems with loss of bowel control? N N  Managing your Medications? N N  Managing your Finances? N N  Housekeeping or managing your Housekeeping? N N    Patient Care Team: Carlean Charter, DO as PCP - General (Family Medicine) Sankar, Seeplaputhur G, MD (General Surgery) Trenton Frock, PA-C as Physician Assistant (Physician Assistant)  Indicate any recent Medical Services you may have received from other than [facility] providers in the past year (date may be  approximate).     Assessment:    This is a routine wellness examination for Chayce.  Hearing/Vision screen Hearing Screening - Comments:: No hearing loss Vision Screening - Comments:: Gets eye exams   Goals Addressed             This Visit's Progress    Patient Stated       Exercise as tolerated      Depression Screen    05/07/2023   10:18 AM 04/30/2023   10:06 AM 01/22/2023   10:14 AM 12/17/2022    1:48 PM 09/18/2022    9:15 AM 06/12/2022    9:25 AM 05/29/2022   10:33 AM  PHQ 2/9 Scores  PHQ - 2 Score 0 0 0 0 0 0 0  PHQ- 9 Score      0 1    Fall Risk    05/07/2023   10:21 AM 05/04/2023   10:26 AM 04/30/2023   10:06 AM 01/22/2023   10:14 AM 12/17/2022    1:48 PM  Fall Risk   Falls in the past year? 0 0 0 0 0  Number falls in past yr: 0  0    Injury with Fall? 0  0  0  Risk for fall due to : No Fall Risks    No Fall Risks  Follow up Falls prevention discussed    Falls evaluation completed    MEDICARE RISK AT HOME: Medicare Risk at Home Any stairs in or around the home?: Yes If so, are there any without handrails?: (Patient-Rptd) Yes Home free of loose throw rugs in walkways, pet beds, electrical cords, etc?: (Patient-Rptd) Yes Adequate lighting in your home to reduce risk of falls?: (Patient-Rptd) Yes Life alert?: (Patient-Rptd) No Use of a cane, walker or w/c?: (Patient-Rptd) No Grab bars in the bathroom?: (Patient-Rptd) No Shower chair or bench in shower?: (Patient-Rptd) Yes Elevated toilet seat or a handicapped toilet?: (Patient-Rptd) No  TIMED UP AND GO:  Was the test performed?  No    Cognitive Function:        05/07/2023   10:22 AM 05/01/2022   11:23 AM  6CIT Screen  What Year? 0 points 0 points  What month? 0 points 0 points  What time? 0 points 0 points  Count back from 20 0 points 0 points  Months in reverse 2 points 0 points  Repeat phrase 0 points 2 points  Total Score 2 points 2 points    Immunizations Immunization History  Administered Date(s) Administered    Influenza,inj,Quad PF,6+ Mos 03/24/2014, 01/23/2021, 12/27/2021   Influenza-Unspecified 03/24/2014   Tdap 12/14/2009    TDAP status: Due, Education has been provided regarding the importance of this vaccine. Advised may receive this vaccine at local pharmacy or Health Dept. Aware to provide a copy of the vaccination record if obtained from local pharmacy or Health Dept. Verbalized acceptance and understanding.  Flu Vaccine status: Declined, Education has been provided regarding the importance of this vaccine but patient still declined. Advised may receive this vaccine at local pharmacy or Health Dept. Aware to provide a copy of the vaccination record if obtained from local pharmacy or Health Dept. Verbalized acceptance and understanding.   Covid-19 vaccine status: Declined, Education has been provided regarding the importance of this vaccine but patient still declined. Advised may receive this vaccine at local pharmacy or Health Dept.or vaccine clinic. Aware to provide a copy of the vaccination record if obtained from local pharmacy or Health Dept. Verbalized acceptance and understanding.  Qualifies for Shingles Vaccine? Yes   Zostavax completed No   Shingrix Completed?: No.    Education has been provided regarding the importance of this vaccine. Patient has been advised to call insurance company to determine out of pocket expense if they have not yet received this vaccine. Advised may also receive vaccine at local pharmacy or Health Dept. Verbalized acceptance and understanding.  Screening Tests Health Maintenance  Topic Date Due   COVID-19 Vaccine (1) Never done   Pneumococcal Vaccine 32-58 Years old (1 of 2 - PCV) Never done   HIV Screening  Never done   Hepatitis C Screening  Never done   Zoster Vaccines- Shingrix (1 of 2) Never done   DTaP/Tdap/Td (2 - Td or Tdap) 12/15/2019   INFLUENZA VACCINE  07/21/2023 (Originally 11/21/2022)   Fecal DNA (Cologuard)  12/17/2023 (Originally 01/14/2015)    Medicare Annual Wellness (AWV)  05/06/2024   HPV VACCINES  Aged Out    Health Maintenance  Health Maintenance Due  Topic Date Due   COVID-19 Vaccine (1) Never done   Pneumococcal Vaccine 40-79 Years old (1 of 2 - PCV) Never done   HIV Screening  Never done   Hepatitis C Screening  Never done   Zoster Vaccines- Shingrix (1 of 2) Never done   DTaP/Tdap/Td (2 - Td or Tdap) 12/15/2019   Colon Cancer Screening: patient declined colonoscopy and cologard  Lung Cancer Screening: (Low Dose CT Chest recommended if Age 85-80 years, 20 pack-year currently smoking OR have quit w/in 15years.) does not qualify.   Lung Cancer Screening Referral: n/a  Additional Screening:  Hepatitis C Screening: does qualify; Completed will have will next lab work  Vision Screening: Recommended annual ophthalmology exams for early detection of glaucoma and other disorders of the eye.  Dental Screening: Recommended annual dental exams for proper oral hygiene   Community Resource Referral / Chronic Care Management: CRR required this visit?  No   CCM required this visit?  No     Plan:     I have personally reviewed and noted the following in the patient's chart:   Medical and social history Use of alcohol , tobacco or illicit drugs  Current medications and supplements including opioid prescriptions. Patient is currently taking opioid prescriptions. Information provided to patient regarding non-opioid alternatives. Patient advised to discuss non-opioid treatment plan with their provider. Functional ability and status Nutritional status Physical activity Advanced directives List of other physicians Hospitalizations, surgeries, and ER visits in previous 12 months Vitals Screenings to include cognitive, depression, and falls Referrals and appointments  In addition, I have reviewed and discussed with patient certain preventive protocols, quality metrics, and best practice recommendations. A written  personalized care plan for preventive services as well as general preventive health recommendations were provided to patient.     Jaunita Messier, CMA   05/07/2023   After Visit Summary: (MyChart) Due to this being a telephonic visit, the after visit summary with patients personalized plan was offered to patient via MyChart   Nurse Notes:  6 CIT Score - 2 Needs Tdap and shingles vaccines. Patient to check with pharmacy to see if insurance will cover. Needs Hep C screening with next lab work. Declined flu and covid vaccines Declined colonoscopy and cologuard Due for a CPE ~ 06/19/23

## 2023-05-07 NOTE — Patient Instructions (Addendum)
 David Reeves , Thank you for taking time to come for your Medicare Wellness Visit. I appreciate your ongoing commitment to your health goals. Please review the following plan we discussed and let me know if I can assist you in the future.   Referrals/Orders/Follow-Ups/Clinician Recommendations: Recommend getting a tetanus and shingles vaccine. Check with your pharmacy to see if your insurance will pay for these. Get a Hepatitis C screening blood test at your next lab work visit. You are due for a physical around 06/19/23 call the office to schedule this appointment.  This is a list of the screening recommended for you and due dates:  Health Maintenance  Topic Date Due   COVID-19 Vaccine (1) Never done   Pneumococcal Vaccination (1 of 2 - PCV) Never done   HIV Screening  Never done   Hepatitis C Screening  Never done   Zoster (Shingles) Vaccine (1 of 2) Never done   DTaP/Tdap/Td vaccine (2 - Td or Tdap) 12/15/2019   Flu Shot  07/21/2023*   Cologuard (Stool DNA test)  12/17/2023*   Medicare Annual Wellness Visit  05/06/2024   HPV Vaccine  Aged Out  *Topic was postponed. The date shown is not the original due date.    Advanced directives: (ACP Link)Information on Advanced Care Planning can be found at [place]  Secretary of [facility] Advance Health Care Directives Advance Health Care Directives ([email or web address]   Once you have completed the forms, please bring a copy of your health care power of attorney and living will to the office to be added to your chart at your convenience.   Next Medicare Annual Wellness Visit scheduled for next year: Yes, 05/12/24 @ 10:10am (video visit)  Fall Prevention in the Home, Adult Falls can cause injuries and affect people of all ages. There are many simple things that you can do to make your home safe and to help prevent falls. If you need it, ask for help making these changes. What actions can I take to prevent falls? General information Use good lighting in  all rooms. Make sure to: Replace any light bulbs that burn out. Turn on lights if it is dark and use night-lights. Keep items that you use often in easy-to-reach places. Lower the shelves around your home if needed. Move furniture so that there are clear paths around it. Do not keep throw rugs or other things on the floor that can make you trip. If any of your floors are uneven, fix them. Add color or contrast paint or tape to clearly mark and help you see: Grab bars or handrails. First and last steps of staircases. Where the edge of each step is. If you use a ladder or stepladder: Make sure that it is fully opened. Do not climb a closed ladder. Make sure the sides of the ladder are locked in place. Have someone hold the ladder while you use it. Know where your pets are as you move through your home. What can I do in the bathroom?     Keep the floor dry. Clean up any water that is on the floor right away. Remove soap buildup in the bathtub or shower. Buildup makes bathtubs and showers slippery. Use non-skid mats or decals on the floor of the bathtub or shower. Attach bath mats securely with double-sided, non-slip rug tape. If you need to sit down while you are in the shower, use a non-slip stool. Install grab bars by the toilet and in the bathtub and shower. Do not  use towel bars as grab bars. What can I do in the bedroom? Make sure that you have a light by your bed that is easy to reach. Do not use any sheets or blankets on your bed that hang to the floor. Have a firm bench or chair with side arms that you can use for support when you get dressed. What can I do in the kitchen? Clean up any spills right away. If you need to reach something above you, use a sturdy step stool that has a grab bar. Keep electrical cables out of the way. Do not use floor polish or wax that makes floors slippery. What can I do with my stairs? Do not leave anything on the stairs. Make sure that you have  a light switch at the top and the bottom of the stairs. Have them installed if you do not have them. Make sure that there are handrails on both sides of the stairs. Fix handrails that are broken or loose. Make sure that handrails are as long as the staircases. Install non-slip stair treads on all stairs in your home if they do not have carpet. Avoid having throw rugs at the top or bottom of stairs, or secure the rugs with carpet tape to prevent them from moving. Choose a carpet design that does not hide the edge of steps on the stairs. Make sure that carpet is firmly attached to the stairs. Fix any carpet that is loose or worn. What can I do on the outside of my home? Use bright outdoor lighting. Repair the edges of walkways and driveways and fix any cracks. Clear paths of anything that can make you trip, such as tools or rocks. Add color or contrast paint or tape to clearly mark and help you see high doorway thresholds. Trim any bushes or trees on the main path into your home. Check that handrails are securely fastened and in good repair. Both sides of all steps should have handrails. Install guardrails along the edges of any raised decks or porches. Have leaves, snow, and ice cleared regularly. Use sand, salt, or ice melt on walkways during winter months if you live where there is ice and snow. In the garage, clean up any spills right away, including grease or oil spills. What other actions can I take? Review your medicines with your health care provider. Some medicines can make you confused or feel dizzy. This can increase your chance of falling. Wear closed-toe shoes that fit well and support your feet. Wear shoes that have rubber soles and low heels. Use a cane, walker, scooter, or crutches that help you move around if needed. Talk with your provider about other ways that you can decrease your risk of falls. This may include seeing a physical therapist to learn to do exercises to improve  movement and strength. Where to find more information Centers for Disease Control and Prevention, STEADI: TonerPromos.no [employer] on Aging: BaseRingTones.pl National Institute on Aging: BaseRingTones.pl Contact a health care provider if: You are afraid of falling at home. You feel weak, drowsy, or dizzy at home. You fall at home. Get help right away if you: Lose consciousness or have trouble moving after a fall. Have a fall that causes a head injury. These symptoms may be an emergency. Get help right away. Call 911. Do not wait to see if the symptoms will go away. Do not drive yourself to the hospital. This information is not intended to replace advice given to you by  your health care provider. Make sure you discuss any questions you have with your health care provider. Document Revised: 12/10/2021 Document Reviewed: 12/10/2021 Elsevier Patient Education  2024 Elsevier Inc.  Managing Pain Without Opioids Opioids are strong medicines used to treat moderate to severe pain. For some people, especially those who have long-term (chronic) pain, opioids may not be the best choice for pain management due to: Side effects like nausea, constipation, and sleepiness. The risk of addiction (opioid use disorder). The longer you take opioids, the greater your risk of addiction. Pain that lasts for more than 3 months is called chronic pain. Managing chronic pain usually requires more than one approach and is often provided by a team of health care providers working together (multidisciplinary approach). Pain management may be done at a pain management center or pain clinic. How to manage pain without the use of opioids Use non-opioid medicines Non-opioid medicines for pain may include: Over-the-counter or prescription non-steroidal anti-inflammatory drugs (NSAIDs). These may be the first medicines used for pain. They work well for muscle and bone pain, and they reduce swelling. Acetaminophen . This  over-the-counter medicine may work well for milder pain but not swelling. Antidepressants. These may be used to treat chronic pain. A certain type of antidepressant (tricyclics) is often used. These medicines are given in lower doses for pain than when used for depression. Anticonvulsants. These are usually used to treat seizures but may also reduce nerve (neuropathic) pain. Muscle relaxants. These relieve pain caused by sudden muscle tightening (spasms). You may also use a pain medicine that is applied to the skin as a patch, cream, or gel (topical analgesic), such as a numbing medicine. These may cause fewer side effects than medicines taken by mouth. Do certain therapies as directed Some therapies can help with pain management. They include: Physical therapy. You will do exercises to gain strength and flexibility. A physical therapist may teach you exercises to move and stretch parts of your body that are weak, stiff, or painful. You can learn these exercises at physical therapy visits and practice them at home. Physical therapy may also involve: Massage. Heat wraps or applying heat or cold to affected areas. Electrical signals that interrupt pain signals (transcutaneous electrical nerve stimulation, TENS). Weak lasers that reduce pain and swelling (low-level laser therapy). Signals from your body that help you learn to regulate pain (biofeedback). Occupational therapy. This helps you to learn ways to function at home and work with less pain. Recreational therapy. This involves trying new activities or hobbies, such as a physical activity or drawing. Mental health therapy, including: Cognitive behavioral therapy (CBT). This helps you learn coping skills for dealing with pain. Acceptance and commitment therapy (ACT) to change the way you think and react to pain. Relaxation therapies, including muscle relaxation exercises and mindfulness-based stress reduction. Pain management counseling. This  may be individual, family, or group counseling.  Receive medical treatments Medical treatments for pain management include: Nerve block injections. These may include a pain blocker and anti-inflammatory medicines. You may have injections: Near the spine to relieve chronic back or neck pain. Into joints to relieve back or joint pain. Into nerve areas that supply a painful area to relieve body pain. Into muscles (trigger point injections) to relieve some painful muscle conditions. A medical device placed near your spine to help block pain signals and relieve nerve pain or chronic back pain (spinal cord stimulation device). Acupuncture. Follow these instructions at home Medicines Take over-the-counter and prescription medicines only as told  by your health care provider. If you are taking pain medicine, ask your health care providers about possible side effects to watch out for. Do not drive or use heavy machinery while taking prescription opioid pain medicine. Lifestyle  Do not use drugs or alcohol  to reduce pain. If you drink alcohol , limit how much you have to: 0-1 drink a day for women who are not pregnant. 0-2 drinks a day for men. Know how much alcohol  is in a drink. In the [place] one drink equals one 12 oz bottle of beer (355 mL), one 5 oz glass of wine (148 mL), or one 1 oz glass of hard liquor (44 mL). Do not use any products that contain nicotine or tobacco. These products include cigarettes, chewing tobacco, and vaping devices, such as e-cigarettes. If you need help quitting, ask your health care provider. Eat a healthy diet and maintain a healthy weight. Poor diet and excess weight may make pain worse. Eat foods that are high in fiber. These include fresh fruits and vegetables, whole grains, and beans. Limit foods that are high in fat and processed sugars, such as fried and sweet foods. Exercise regularly. Exercise lowers stress and may help relieve pain. Ask your health care  provider what activities and exercises are safe for you. If your health care provider approves, join an exercise class that combines movement and stress reduction. Examples include yoga and tai chi. Get enough sleep. Lack of sleep may make pain worse. Lower stress as much as possible. Practice stress reduction techniques as told by your therapist. General instructions Work with all your pain management providers to find the treatments that work best for you. You are an important member of your pain management team. There are many things you can do to reduce pain on your own. Consider joining an online or in-person support group for people who have chronic pain. Keep all follow-up visits. This is important. Where to find more information You can find more information about managing pain without opioids from: American Academy of Pain Medicine: painmed.org Institute for Chronic Pain: instituteforchronicpain.org American Chronic Pain Association: theacpa.org Contact a health care provider if: You have side effects from pain medicine. Your pain gets worse or does not get better with treatments or home therapy. You are struggling with anxiety or depression. Summary Many types of pain can be managed without opioids. Chronic pain may respond better to pain management without opioids. Pain is best managed when you and a team of health care providers work together. Pain management without opioids may include non-opioid medicines, medical treatments, physical therapy, mental health therapy, and lifestyle changes. Tell your health care providers if your pain gets worse or is not being managed well enough. This information is not intended to replace advice given to you by your health care provider. Make sure you discuss any questions you have with your health care provider. Document Revised: 07/19/2020 Document Reviewed: 07/19/2020 Elsevier Patient Education  2024 [employer].

## 2023-05-23 ENCOUNTER — Other Ambulatory Visit: Payer: Self-pay | Admitting: Family Medicine

## 2023-05-23 ENCOUNTER — Other Ambulatory Visit: Payer: Self-pay | Admitting: Physician Assistant

## 2023-05-23 DIAGNOSIS — E7800 Pure hypercholesterolemia, unspecified: Secondary | ICD-10-CM

## 2023-05-23 DIAGNOSIS — I10 Essential (primary) hypertension: Secondary | ICD-10-CM

## 2023-05-23 NOTE — Telephone Encounter (Signed)
Requested Prescriptions  Refused Prescriptions Disp Refills   amLODipine (NORVASC) 5 MG tablet [Pharmacy Med Name: amLODIPine Besylate 5 MG Oral Tablet] 90 tablet 3    Sig: TAKE 1 TABLET BY MOUTH DAILY     Cardiovascular: Calcium Channel Blockers 2 Passed - 05/23/2023  4:36 PM      Passed - Last BP in normal range    BP Readings from Last 1 Encounters:  04/30/23 116/80         Passed - Last Heart Rate in normal range    Pulse Readings from Last 1 Encounters:  04/30/23 77         Passed - Valid encounter within last 6 months    Recent Outpatient Visits           5 months ago Primary hypertension   [facility] Sherlyn Hay, DO   11 months ago Annual physical exam   [facility] Alfredia Ferguson, PA-C   1 year ago Primary hypertension   [place] [facility] Edwards, [place], PA-C   1 year ago History of stroke with residual deficit   [facility] Edwards, Lillia Abed, PA-C   2 years ago Alterations of sensations following cerebrovascular accident   [facility] Alfredia Ferguson, [place]

## 2023-06-02 ENCOUNTER — Other Ambulatory Visit: Payer: Self-pay | Admitting: Family Medicine

## 2023-06-02 DIAGNOSIS — I10 Essential (primary) hypertension: Secondary | ICD-10-CM

## 2023-08-04 ENCOUNTER — Other Ambulatory Visit: Payer: Self-pay | Admitting: Physician Assistant

## 2023-08-04 ENCOUNTER — Other Ambulatory Visit: Payer: Self-pay | Admitting: Physical Medicine & Rehabilitation

## 2023-08-04 DIAGNOSIS — F410 Panic disorder [episodic paroxysmal anxiety] without agoraphobia: Secondary | ICD-10-CM

## 2023-08-04 DIAGNOSIS — I6930 Unspecified sequelae of cerebral infarction: Secondary | ICD-10-CM

## 2023-08-04 DIAGNOSIS — R252 Cramp and spasm: Secondary | ICD-10-CM

## 2023-08-04 DIAGNOSIS — G894 Chronic pain syndrome: Secondary | ICD-10-CM

## 2023-08-04 DIAGNOSIS — F09 Unspecified mental disorder due to known physiological condition: Secondary | ICD-10-CM

## 2023-08-04 DIAGNOSIS — I69398 Other sequelae of cerebral infarction: Secondary | ICD-10-CM

## 2023-08-04 DIAGNOSIS — G8929 Other chronic pain: Secondary | ICD-10-CM

## 2023-08-05 NOTE — Telephone Encounter (Signed)
 Requested Prescriptions  Pending Prescriptions Disp Refills   prazosin (MINIPRESS) 1 MG capsule [Pharmacy Med Name: PRAZOSIN HYDROCHLORIDE  1MG   CAP] 200 capsule 2    Sig: TAKE 2 CAPSULES BY MOUTH AT  BEDTIME DO NOT TAKE WITH AMBIEN     Cardiovascular:  Alpha Blockers Failed - 08/05/2023  3:10 PM      Failed - Valid encounter within last 6 months    Recent Outpatient Visits   None            Passed - Last BP in normal range    BP Readings from Last 1 Encounters:  04/30/23 116/80          gabapentin (NEURONTIN) 400 MG capsule [Pharmacy Med Name: Gabapentin 400 MG Oral Capsule] 300 capsule 0    Sig: TAKE 1 CAPSULE BY MOUTH 3 TIMES  DAILY     Neurology: Anticonvulsants - gabapentin Failed - 08/05/2023  3:10 PM      Failed - Valid encounter within last 12 months    Recent Outpatient Visits   None            Passed - Cr in normal range and within 360 days    Creatinine  Date Value Ref Range Status  11/14/2013 0.85 0.60 - 1.30 mg/dL Final   Creatinine, Ser  Date Value Ref Range Status  11/20/2022 0.79 0.61 - 1.24 mg/dL Final         Passed - Completed PHQ-2 or PHQ-9 in the last 360 days       donepezil (ARICEPT) 10 MG tablet [Pharmacy Med Name: Donepezil HCl 10 MG Oral Tablet] 100 tablet 2    Sig: TAKE 1 TABLET BY MOUTH AT  BEDTIME     Neurology:  Alzheimer's Agents Failed - 08/05/2023  3:10 PM      Failed - Valid encounter within last 6 months    Recent Outpatient Visits   None

## 2023-08-05 NOTE — Telephone Encounter (Signed)
 Requested medication (s) are due for refill today: yes  Requested medication (s) are on the active medication list: yes  Last refill:  11/25/22 for both   Future visit scheduled: yes  Notes to clinic:  called pt and made a f/u appt   Requested Prescriptions  Pending Prescriptions Disp Refills   prazosin (MINIPRESS) 1 MG capsule [Pharmacy Med Name: PRAZOSIN HYDROCHLORIDE  1MG   CAP] 200 capsule 2    Sig: TAKE 2 CAPSULES BY MOUTH AT  BEDTIME DO NOT TAKE WITH AMBIEN     Cardiovascular:  Alpha Blockers Failed - 08/05/2023  3:29 PM      Failed - Valid encounter within last 6 months    Recent Outpatient Visits   None            Passed - Last BP in normal range    BP Readings from Last 1 Encounters:  04/30/23 116/80          donepezil (ARICEPT) 10 MG tablet [Pharmacy Med Name: Donepezil HCl 10 MG Oral Tablet] 100 tablet 2    Sig: TAKE 1 TABLET BY MOUTH AT  BEDTIME     Neurology:  Alzheimer's Agents Failed - 08/05/2023  3:29 PM      Failed - Valid encounter within last 6 months    Recent Outpatient Visits   None            Signed Prescriptions Disp Refills   gabapentin (NEURONTIN) 400 MG capsule 300 capsule 0    Sig: TAKE 1 CAPSULE BY MOUTH 3 TIMES  DAILY     Neurology: Anticonvulsants - gabapentin Failed - 08/05/2023  3:29 PM      Failed - Valid encounter within last 12 months    Recent Outpatient Visits   None            Passed - Cr in normal range and within 360 days    Creatinine  Date Value Ref Range Status  11/14/2013 0.85 0.60 - 1.30 mg/dL Final   Creatinine, Ser  Date Value Ref Range Status  11/20/2022 0.79 0.61 - 1.24 mg/dL Final         Passed - Completed PHQ-2 or PHQ-9 in the last 360 days

## 2023-08-05 NOTE — Telephone Encounter (Signed)
 Pt needs to schedule an OV. Last seen in office 12/17/2022

## 2023-08-07 ENCOUNTER — Other Ambulatory Visit: Payer: Self-pay | Admitting: Family Medicine

## 2023-08-07 DIAGNOSIS — K219 Gastro-esophageal reflux disease without esophagitis: Secondary | ICD-10-CM

## 2023-08-08 NOTE — Telephone Encounter (Signed)
 Rx 12/17/22 #90 2RF- too soon Requested Prescriptions  Pending Prescriptions Disp Refills   famotidine  (PEPCID ) 20 MG tablet [Pharmacy Med Name: Famotidine  20 MG Oral Tablet] 100 tablet 2    Sig: TAKE 1 TABLET BY MOUTH DAILY     Gastroenterology:  H2 Antagonists Failed - 08/08/2023 12:22 PM      Failed - Valid encounter within last 12 months    Recent Outpatient Visits   None

## 2023-08-27 ENCOUNTER — Encounter: Payer: Self-pay | Admitting: Physical Medicine & Rehabilitation

## 2023-08-27 ENCOUNTER — Encounter: Payer: 59 | Attending: Physical Medicine & Rehabilitation | Admitting: Physical Medicine & Rehabilitation

## 2023-08-27 VITALS — BP 96/63 | HR 69 | Ht 73.0 in | Wt 223.0 lb

## 2023-08-27 DIAGNOSIS — G8111 Spastic hemiplegia affecting right dominant side: Secondary | ICD-10-CM | POA: Diagnosis not present

## 2023-08-27 DIAGNOSIS — Z5181 Encounter for therapeutic drug level monitoring: Secondary | ICD-10-CM

## 2023-08-27 DIAGNOSIS — I69398 Other sequelae of cerebral infarction: Secondary | ICD-10-CM

## 2023-08-27 DIAGNOSIS — R252 Cramp and spasm: Secondary | ICD-10-CM | POA: Diagnosis not present

## 2023-08-27 MED ORDER — TIZANIDINE HCL 4 MG PO TABS: 8.0000 mg | ORAL_TABLET | Freq: Four times a day (QID) | ORAL | 4 refills | Status: DC

## 2023-08-27 NOTE — Patient Instructions (Signed)
 ALWAYS FEEL FREE TO CALL OUR OFFICE WITH ANY PROBLEMS OR QUESTIONS [phone])  **PLEASE NOTE** ALL MEDICATION REFILL REQUESTS (INCLUDING CONTROLLED SUBSTANCES) NEED TO BE MADE AT LEAST 7 DAYS PRIOR TO REFILL BEING DUE. ANY REFILL REQUESTS INSIDE THAT TIME FRAME MAY RESULT IN DELAYS IN RECEIVING YOUR PRESCRIPTION.

## 2023-08-27 NOTE — Progress Notes (Signed)
 Subjective:    Patient ID: David Reeves, male    DOB: Jun 12, 1970, 53 y.o.   MRN: [id number]  HPI David Reeves is here in follow up of his right spastic hemiparesis. He has an attorney who is helping with approval of his case. Some sort of litigation took place last month and he's waiting on a decision. He's optimistic.   From a spasticity standpoint he's doing well. We last did botox  injections back in the fall. He stretches daily.   He does complain of dry  mouth. He drinks a lot which helps wit hit.   Tizanidne helps with tone control. He has to break a tablet in half with each dose. He takes 6mg  qid.     Pain Inventory Average Pain 4 Pain Right Now 2 My pain is constant, aching, and throb  In the last 24 hours, has pain interfered with the following? General activity 0 Relation with others 0 Enjoyment of life 0 What TIME of day is your pain at its worst? varies Sleep (in general) Fair  Pain is worse with: walking and sitting Pain improves with: rest and heat Relief from Meds:  Patient not able to state but the medication works  Family History  Problem Relation Age of Onset   Diabetes Mother    Other Father        unknown medical history   Diabetes Maternal Uncle    Diabetes Maternal Grandmother    Heart disease Maternal Grandmother    Social History   Socioeconomic History   Marital status: Divorced    Spouse name: Not on file   Number of children: 1   Years of education: Not on file   Highest education level: Some college, no degree  Occupational History   Occupation: disability  Tobacco Use   Smoking status: Never   Smokeless tobacco: Never   Tobacco comments:    cbd    Vaping Use   Vaping status: Never Used  Substance and Sexual Activity   Alcohol  use: Not Currently    Comment: last used 10/2020   Drug use: Not Currently    Types: Marijuana    Comment: last used in 2023   Sexual activity: Not Currently    Birth control/protection: Abstinence, None   Other Topics Concern   Not on file  Social History Narrative   Not on file   Social Drivers of Health   Financial Resource Strain: Low Risk  (05/07/2023)   Overall Financial Resource Strain (CARDIA)    Difficulty of Paying Living Expenses: Not hard at all  Food Insecurity: No Food Insecurity (05/07/2023)   Hunger Vital Sign    Worried About Running Out of Food in the Last Year: Never true    Ran Out of Food in the Last Year: Never true  Transportation Needs: No Transportation Needs (05/07/2023)   PRAPARE - Administrator, Civil Service (Medical): No    Lack of Transportation (Non-Medical): No  Physical Activity: Inactive (05/07/2023)   Exercise Vital Sign    Days of Exercise per Week: 0 days    Minutes of Exercise per Session: 0 min  Stress: No Stress Concern Present (05/07/2023)   [employer] of Occupational Health - Occupational Stress Questionnaire    Feeling of Stress : Not at all  Social Connections: Socially Isolated (05/07/2023)   Social Connection and Isolation Panel [NHANES]    Frequency of Communication with Friends and Family: More than three times a week  Frequency of Social Gatherings with Friends and Family: Once a week    Attends Religious Services: Never    Database administrator or Organizations: No    Attends Engineer, structural: Never    Marital Status: Divorced   Past Surgical History:  Procedure Laterality Date   CYSTECTOMY     head   HERNIA REPAIR Bilateral [phone]   [facility]   SPERMATOCELECTOMY Left 08/05/2022   Procedure: SPERMATOCELECTOMY;  Surgeon: Dustin Gimenez, MD;  Location: [facility] ORS;  Service: Urology;  Laterality: Left;   TOE SURGERY Right 2007   Past Surgical History:  Procedure Laterality Date   CYSTECTOMY     head   HERNIA REPAIR Bilateral 2006,2014   [facility]   SPERMATOCELECTOMY Left 08/05/2022   Procedure: SPERMATOCELECTOMY;  Surgeon: Dustin Gimenez, MD;  Location: [facility] OR[facility];  Service: Urology;  Laterality:  Left;   TOE SURGERY Right 2007   Past Medical History:  Diagnosis Date   Allergy 04/22/2018   Penicillin   Anxiety    Aortic atherosclerosis (HCC)    Aphasia as late effect of cerebrovascular accident (CVA)    Back pain 04/22/2012   Bone spur    Bulging disc 04/22/2012   Chronic pain syndrome    a.) on buprenorphrine   Cognitive dysfunction    Degenerative disc disease    Erectile dysfunction    GERD (gastroesophageal reflux disease)    Hemorrhoids    History of substance use    a.) THC + cocaine + ETOH   HLD (hyperlipidemia)    HTN (hypertension)    Hydrocele, bilateral    Insomnia    Ischemic cerebrovascular accident (CVA) (HCC) 11/08/2020   a.) MRI brain 11/08/2020 --> extensive hypodensities and loss of gray-white differentiation in the left ACA and left MCA territories with slight hyperdense left MCA and likely hyperdense left distal ICA; etiology felt to be secondary to cocaine use   Lumbar stenosis with neurogenic claudication    Osteoarthritis    Panic anxiety syndrome    Panic disorder    Right inguinal hernia    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    a.) on BZO (diazepam ) PRN   Spermatocele (LEFT)    Taking multiple medications for chronic disease    Wt 223 lb (101.2 kg)   BMI 29.42 kg/m   Opioid Risk Score:   Fall Risk Score:  `1  Depression screen [facility] 2/9     08/27/2023    9:21 AM 05/07/2023   10:18 AM 04/30/2023   10:06 AM 01/22/2023   10:14 AM 12/17/2022    1:48 PM 09/18/2022    9:15 AM 06/12/2022    9:25 AM  Depression screen PHQ 2/9  Decreased Interest 0 0 0 0 0 0 0  Down, Depressed, Hopeless 0 0 0 0 0 0 0  PHQ - 2 Score 0 0 0 0 0 0 0  Altered sleeping       0  Tired, decreased energy       0  Change in appetite       0  Feeling bad or failure about yourself        0  Trouble concentrating       0  Moving slowly or fidgety/restless       0  Suicidal thoughts       0  PHQ-9 Score       0    Review of Systems  Musculoskeletal:  Positive for back pain  and  gait problem.  All other systems reviewed and are negative.      Objective:   Physical Exam  General: No acute distress HEENT: NCAT, EOMI, oral membranes moist Cards: reg rate  Chest: normal effort Abdomen: Soft, NT, ND Skin: dry, intact Extremities: no edema Psych: pleasant and appropriate  Skin: intact Neuro: Patient is alert and oriented x 3.  Demonstrates mild facial droop on the right and dysarthria but speech is clearly intelligible.  Cognitively he demonstrates good insight and awareness and functional memory.  Right upper extremity is 3 out of 5 with shoulder abduction 4 to 4+ out of 5 with elbow flexion, 4 out of 5 elbow extension.  Wrist and hand is 3+ out of 5.  Right lower extremity grossly 4 to 4+ out of 5 throughout.  Sensory remains 1 out of 2 right arm and leg.  Tone is trace  throughout RUE.   Deep tendon reflexes are 3+ in the right upper extremity and 2+ right lower extremity.   Gait is steady  Musculoskeletal: no pain         Assessment & Plan:  Pleasant 53 year old male with history of previous left CVA and July 2022 with residual right sided spastic hemiparesis hemisensory loss.  Right upper extremity is most affected. 2.  Chronic low back pain followed by pain clinic     Plan: Will increase tizanidine  to 8mg  qid for ease of use and for more aggressive tone control -check LFT's -discussed dry mouth, sour candy, plenty of water No need for further botox  at this point. His spasticity seems controlled. He's working on [employer] daily Vivistim process still pending. Hopefully he'll hear something soon.      Patient will follow up with me in 6 months. 20 minutes face to face patient care time were spent during this visit. All questions were encouraged and answered.  [name]

## 2023-08-28 LAB — HEPATIC FUNCTION PANEL
ALT: 34 IU/L (ref 0–44)
AST: 27 IU/L (ref 0–40)
Albumin: 4.4 g/dL (ref 3.8–4.9)
Alkaline Phosphatase: 65 IU/L (ref 44–121)
Bilirubin Total: 0.6 mg/dL (ref 0.0–1.2)
Bilirubin, Direct: 0.18 mg/dL (ref 0.00–0.40)
Total Protein: 6.9 g/dL (ref 6.0–8.5)

## 2023-08-29 ENCOUNTER — Encounter: Payer: Self-pay | Admitting: Physical Medicine & Rehabilitation

## 2023-09-03 ENCOUNTER — Ambulatory Visit: Admitting: Family Medicine

## 2023-09-10 ENCOUNTER — Telehealth: Admitting: Family Medicine

## 2023-09-10 ENCOUNTER — Encounter: Payer: Self-pay | Admitting: Family Medicine

## 2023-09-10 DIAGNOSIS — S32010S Wedge compression fracture of first lumbar vertebra, sequela: Secondary | ICD-10-CM

## 2023-09-10 DIAGNOSIS — F09 Unspecified mental disorder due to known physiological condition: Secondary | ICD-10-CM

## 2023-09-10 DIAGNOSIS — Z760 Encounter for issue of repeat prescription: Secondary | ICD-10-CM

## 2023-09-10 DIAGNOSIS — Z1159 Encounter for screening for other viral diseases: Secondary | ICD-10-CM

## 2023-09-10 DIAGNOSIS — I10 Essential (primary) hypertension: Secondary | ICD-10-CM

## 2023-09-10 DIAGNOSIS — G8929 Other chronic pain: Secondary | ICD-10-CM | POA: Diagnosis not present

## 2023-09-10 DIAGNOSIS — I69398 Other sequelae of cerebral infarction: Secondary | ICD-10-CM

## 2023-09-10 DIAGNOSIS — J302 Other seasonal allergic rhinitis: Secondary | ICD-10-CM

## 2023-09-10 DIAGNOSIS — K219 Gastro-esophageal reflux disease without esophagitis: Secondary | ICD-10-CM

## 2023-09-10 DIAGNOSIS — J3089 Other allergic rhinitis: Secondary | ICD-10-CM

## 2023-09-10 DIAGNOSIS — I6930 Unspecified sequelae of cerebral infarction: Secondary | ICD-10-CM

## 2023-09-10 DIAGNOSIS — E7800 Pure hypercholesterolemia, unspecified: Secondary | ICD-10-CM | POA: Diagnosis not present

## 2023-09-10 DIAGNOSIS — F410 Panic disorder [episodic paroxysmal anxiety] without agoraphobia: Secondary | ICD-10-CM

## 2023-09-10 DIAGNOSIS — G8111 Spastic hemiplegia affecting right dominant side: Secondary | ICD-10-CM

## 2023-09-10 DIAGNOSIS — I69320 Aphasia following cerebral infarction: Secondary | ICD-10-CM

## 2023-09-10 DIAGNOSIS — I700 Atherosclerosis of aorta: Secondary | ICD-10-CM

## 2023-09-10 DIAGNOSIS — Z114 Encounter for screening for human immunodeficiency virus [HIV]: Secondary | ICD-10-CM

## 2023-09-10 DIAGNOSIS — G894 Chronic pain syndrome: Secondary | ICD-10-CM

## 2023-09-10 MED ORDER — CELECOXIB 100 MG PO CAPS: 100.0000 mg | ORAL_CAPSULE | Freq: Two times a day (BID) | ORAL | 3 refills | Status: AC

## 2023-09-10 MED ORDER — DONEPEZIL HCL 10 MG PO TABS: 10.0000 mg | ORAL_TABLET | Freq: Every day | ORAL | 3 refills | Status: AC

## 2023-09-10 MED ORDER — FAMOTIDINE 20 MG PO TABS: 20.0000 mg | ORAL_TABLET | Freq: Every day | ORAL | 3 refills | Status: AC

## 2023-09-10 MED ORDER — PRAZOSIN HCL 1 MG PO CAPS: 2.0000 mg | ORAL_CAPSULE | Freq: Every day | ORAL | 3 refills | Status: AC

## 2023-09-10 MED ORDER — GABAPENTIN 400 MG PO CAPS: 400.0000 mg | ORAL_CAPSULE | Freq: Three times a day (TID) | ORAL | 1 refills | Status: AC

## 2023-09-10 MED ORDER — CETIRIZINE HCL 10 MG PO TABS: 10.0000 mg | ORAL_TABLET | Freq: Every day | ORAL | 3 refills | Status: AC

## 2023-09-10 NOTE — Progress Notes (Signed)
 MyChart Video Visit    Virtual Visit via Video Note   This format is felt to be most appropriate for this patient at this time. Physical exam was limited by quality of the video and audio technology used for the visit.   Patient location: Home [place], [place]) Provider location: [facility]  I discussed the limitations of evaluation and management by telemedicine and the availability of in person appointments. The patient expressed understanding and agreed to proceed.  Patient: David Reeves   DOB: 05-Nov-1970   53 y.o. Male  MRN: [id number] Visit Date: 09/10/2023  Today's healthcare provider: Carlean Charter, DO   No chief complaint on file.  Subjective    HPI  David Reeves "[name]" is a 53 year old male who presents for a medication refill.  He is seeking medication refills .  He just received a refill for tizanidine , which was recently adjusted from 6 mg to 8 mg. His current medication regimen includes Celebrex  100 mg twice daily, gabapentin  three times a day, donepezil , atorvastatin , famotidine , prazosin  2mg  nightly, Zyrtec , amlodipine , and supplements. He receives his refills through [facility] and has a substantial supply of medications at home, though he is unsure which pills are at there.  He has a history of nocturnal panic attacks, initially managed with Valium . His current treatment includes prazosin , which was adjusted by a previous doctor, and gabapentin , which he finds helpful. He continues to take donepezil .  He has experienced COVID-19 twice, with the first instance resulting in a loss of taste for two weeks. He has not received the COVID-19 vaccine and does not wish to. He is also due for tetanus, shingles, and pneumonia vaccines, which he has not yet received.  He reports worsening allergies with age, particularly during pollen season, and takes Zyrtec  to manage these symptoms. No recent heartburn, indicating effective management with  famotidine .  He has a history of stroke but reports feeling his health is overall better recently. He has undergone recent blood work,, though this was only a liver function panel.[name]  He is open to being screened for hepatitis C and HIV.  He denies any other concerns at this time.   Medications: Outpatient Medications Prior to Visit  Medication Sig   acetaminophen  (TYLENOL ) 500 MG tablet Take 1,000 mg by mouth every 6 (six) hours as needed for mild pain.   amLODipine  (NORVASC ) 5 MG tablet TAKE 1 TABLET BY MOUTH DAILY   Ascorbic Acid (VITAMIN C PO) Take 1 tablet by mouth daily at 6 (six) AM.   aspirin  EC 81 MG tablet Take 81 mg by mouth daily. Swallow whole.   atorvastatin  (LIPITOR) 80 MG tablet TAKE 1 TABLET BY MOUTH DAILY   buprenorphine (BUTRANS) 15 MCG/HR 1 patch once a week. Thursdays   buprenorphine (BUTRANS) 20 MCG/HR PTWK Place 1 patch onto the skin once a week.   calcium  carbonate (TUMS - DOSED IN MG ELEMENTAL CALCIUM ) 500 MG chewable tablet Chew 1 tablet by mouth as needed for indigestion or heartburn.   Calcium  Carbonate Antacid (ALKA-SELTZER ANTACID PO) Take 1 tablet by mouth as needed.   Calcium  Carbonate Antacid 200 MG TBDP Take by mouth.   Cholecalciferol  (VITAMIN D3) 2000 units TABS Take 1 tablet by mouth daily at 6 (six) AM.   diazepam  (VALIUM ) 10 MG tablet Take 10 mg by mouth 2 (two) times daily.   diclofenac  (VOLTAREN ) 50 MG EC tablet    Lactobacillus (REJUVAFLOR) CAPS Take 1 capsule by mouth  daily.   lidocaine  (LIDODERM ) 5 % 1 patch daily. Lower back and upper shoulder   Multiple Vitamin (MULTIVITAMIN) tablet Take 1 tablet by mouth daily.   Omega-3 Fatty Acids (FISH OIL PO) Take 1 tablet by mouth daily at 6 (six) AM.   SODIUM FLUORIDE 5000 PPM 1.1 % GEL dental gel PLEASE SEE ATTACHED FOR DETAILED DIRECTIONS   tiZANidine  (ZANAFLEX ) 4 MG tablet Take 2 tablets (8 mg total) by mouth in the morning, at noon, in the evening, and at bedtime.   [DISCONTINUED] celecoxib   (CELEBREX ) 100 MG capsule TAKE 1 CAPSULE BY MOUTH TWICE  DAILY   [DISCONTINUED] cetirizine  (ZYRTEC ) 10 MG tablet Take 1 tablet (10 mg total) by mouth daily.   [DISCONTINUED] donepezil  (ARICEPT ) 10 MG tablet TAKE 1 TABLET BY MOUTH AT  BEDTIME   [DISCONTINUED] famotidine  (PEPCID ) 20 MG tablet Take 1 tablet (20 mg total) by mouth daily.   [DISCONTINUED] gabapentin  (NEURONTIN ) 400 MG capsule TAKE 1 CAPSULE BY MOUTH 3 TIMES  DAILY   [DISCONTINUED] prazosin  (MINIPRESS ) 1 MG capsule TAKE 2 CAPSULES BY MOUTH AT  BEDTIME DO NOT TAKE WITH AMBIEN    No facility-administered medications prior to visit.        Objective    There were no vitals taken for this visit.      Physical Exam Constitutional:      General: He is not in acute distress.    Appearance: Normal appearance. He is not diaphoretic.  HENT:     Head: Normocephalic.  Eyes:     Conjunctiva/sclera: Conjunctivae normal.  Pulmonary:     Effort: Pulmonary effort is normal. No respiratory distress.  Neurological:     Mental Status: He is alert and oriented to person, place, and time. Mental status is at baseline.        Assessment & Plan    Medication refill  Elevated LDL cholesterol level -     Lipid panel  Primary hypertension -     Basic metabolic panel with GFR  Aortic atherosclerosis (HCC)  Chronic bilateral low back pain with right-sided sciatica -     Celecoxib ; Take 1 capsule (100 mg total) by mouth 2 (two) times daily.  Dispense: 200 capsule; Refill: 3 -     Gabapentin ; Take 1 capsule (400 mg total) by mouth 3 (three) times daily.  Dispense: 300 capsule; Refill: 1  Chronic pain associated with significant psychosocial dysfunction -     Gabapentin ; Take 1 capsule (400 mg total) by mouth 3 (three) times daily.  Dispense: 300 capsule; Refill: 1  Cognitive dysfunction -     Donepezil  HCl; Take 1 tablet (10 mg total) by mouth at bedtime.  Dispense: 100 tablet; Refill: 3  Gastroesophageal reflux disease,  unspecified whether esophagitis present -     Famotidine ; Take 1 tablet (20 mg total) by mouth daily.  Dispense: 100 tablet; Refill: 3  Panic disorder -     Prazosin  HCl; Take 2 capsules (2 mg total) by mouth at bedtime.  Dispense: 200 capsule; Refill: 3  Perennial allergic rhinitis with seasonal variation -     Cetirizine  HCl; Take 1 tablet (10 mg total) by mouth daily.  Dispense: 100 tablet; Refill: 3  History of stroke with residual deficit -     Gabapentin ; Take 1 capsule (400 mg total) by mouth 3 (three) times daily.  Dispense: 300 capsule; Refill: 1 -     Donepezil  HCl; Take 1 tablet (10 mg total) by mouth at bedtime.  Dispense: 100 tablet;  Refill: 3  Spastic hemiparesis of right dominant side (HCC)  Aphasia as late effect of cerebrovascular accident (CVA)  Closed compression fracture of L1 vertebra, sequela  Encounter for screening for HIV -     HIV Antibody (routine testing w rflx)  Encounter for hepatitis C screening test for low risk patient -     HCV Ab w Reflex to Quant PCR      General Health Maintenance; medication refill Due for tetanus, shingles, and pneumonia vaccines. Declined COVID-19 vaccination despite previous infection. - Recommend tetanus and shingles vaccines at the pharmacy due to Medicare restrictions. - Recommend pneumonia vaccine. - Discuss COVID-19 vaccine availability at pharmacies.   History of stroke with residual deficit (aphasia, spastic hemiparesis of right dominant side) Reported improvement in symptoms.  Muscle spasticity Managed with tizanidine , recently adjusted from 6 mg to 8 mg by Dr. Delfin Fell (pt's PM&R doctor). - Continue tizanidine  at 8 mg as prescribed.  Cognitive dysfunction Managed with donepezil . - Continue donepezil .  Primary hypertension Chronic, stable at recent visits with other providers.  Unable to assess today.  Managed with amlodipine .  No changes  Elevated LDL cholesterol Managed with atorvastatin .  No  changes  Gastroesophageal reflux disease (GERD) Well-controlled with famotidine . - Continue famotidine .  Allergic rhinitis Exacerbated by pollen season, managed with Zyrtec  as needed. - Continue Zyrtec  as needed.  Panic disorder Nocturnal panic attacks managed with prazosin , taken as two pills nightly. - Continue prazosin , 2 mg nightly.  Chronic pain; closed compression fracture of L1 vertebra No acute concerns. Patient's following with pain management.  Defer to specialist management.    Return in about 3 months (around 12/11/2023) for CPE.     I discussed the assessment and treatment plan with the patient. The patient was provided an opportunity to ask questions and all were answered. The patient agreed with the plan and demonstrated an understanding of the instructions.   The patient was advised to call back or seek an in-person evaluation if the symptoms worsen or if the condition fails to improve as anticipated.  I provided 14 minutes of virtual-face-to-face time during this encounter.   Carlean Charter, DO [facility] [phone] (phone) [phone] (fax)  [facility]

## 2023-09-10 NOTE — Patient Instructions (Addendum)
 Recommended vaccines: Tdap (tetanus), Shingrix (shingles), Prevnar-20 (pneumonia).;

## 2023-09-17 ENCOUNTER — Encounter: Payer: Self-pay | Admitting: Physical Medicine & Rehabilitation

## 2023-09-18 ENCOUNTER — Ambulatory Visit: Payer: Self-pay | Admitting: Family Medicine

## 2023-09-18 ENCOUNTER — Encounter: Payer: Self-pay | Admitting: Family Medicine

## 2023-09-18 DIAGNOSIS — G8111 Spastic hemiplegia affecting right dominant side: Secondary | ICD-10-CM

## 2023-09-18 LAB — BASIC METABOLIC PANEL WITH GFR
BUN/Creatinine Ratio: 9 (ref 9–20)
BUN: 7 mg/dL (ref 6–24)
CO2: 25 mmol/L (ref 20–29)
Calcium: 9.3 mg/dL (ref 8.7–10.2)
Chloride: 101 mmol/L (ref 96–106)
Creatinine, Ser: 0.78 mg/dL (ref 0.76–1.27)
Glucose: 117 mg/dL — ABNORMAL HIGH (ref 70–99)
Potassium: 4.1 mmol/L (ref 3.5–5.2)
Sodium: 141 mmol/L (ref 134–144)
eGFR: 107 mL/min/{1.73_m2} (ref >59)

## 2023-09-18 LAB — LIPID PANEL
Chol/HDL Ratio: 3.5 ratio (ref 0.0–5.0)
Cholesterol, Total: 144 mg/dL (ref 100–199)
HDL: 41 mg/dL (ref >39)
LDL Chol Calc (NIH): 83 mg/dL (ref 0–99)
Triglycerides: 109 mg/dL (ref 0–149)
VLDL Cholesterol Cal: 20 mg/dL (ref 5–40)

## 2023-09-18 LAB — HCV INTERPRETATION

## 2023-09-18 LAB — HCV AB W REFLEX TO QUANT PCR: HCV Ab: NONREACTIVE

## 2023-09-18 LAB — HIV ANTIBODY (ROUTINE TESTING W REFLEX): HIV Screen 4th Generation wRfx: NONREACTIVE

## 2023-09-18 NOTE — Telephone Encounter (Signed)
 Please see the message below.

## 2023-09-22 ENCOUNTER — Telehealth: Payer: Self-pay

## 2023-09-22 NOTE — Telephone Encounter (Signed)
 Patient called stating he got an approval for Vivistim and he is requesting a video visit to discuss where to get it done at.

## 2023-09-24 NOTE — Telephone Encounter (Signed)
 Referral for vivistim lead placement sent to dr. Nat Badger

## 2024-01-12 ENCOUNTER — Other Ambulatory Visit: Payer: Self-pay | Admitting: Physical Medicine & Rehabilitation

## 2024-01-12 DIAGNOSIS — I69398 Other sequelae of cerebral infarction: Secondary | ICD-10-CM

## 2024-01-18 ENCOUNTER — Other Ambulatory Visit: Payer: Self-pay | Admitting: Physical Medicine & Rehabilitation

## 2024-01-18 DIAGNOSIS — R252 Cramp and spasm: Secondary | ICD-10-CM

## 2024-01-25 ENCOUNTER — Other Ambulatory Visit: Payer: Self-pay | Admitting: Family Medicine

## 2024-01-25 DIAGNOSIS — E7800 Pure hypercholesterolemia, unspecified: Secondary | ICD-10-CM

## 2024-02-03 ENCOUNTER — Other Ambulatory Visit: Payer: Self-pay | Admitting: Family Medicine

## 2024-02-03 DIAGNOSIS — I10 Essential (primary) hypertension: Secondary | ICD-10-CM

## 2024-02-25 ENCOUNTER — Encounter: Attending: Physical Medicine & Rehabilitation | Admitting: Physical Medicine & Rehabilitation

## 2024-02-25 ENCOUNTER — Encounter: Payer: Self-pay | Admitting: Physical Medicine & Rehabilitation

## 2024-02-25 VITALS — BP 109/70 | HR 70 | Ht 73.0 in | Wt 213.0 lb

## 2024-02-25 DIAGNOSIS — G8111 Spastic hemiplegia affecting right dominant side: Secondary | ICD-10-CM | POA: Diagnosis not present

## 2024-02-25 NOTE — Progress Notes (Signed)
 Subjective:    Patient ID: David Reeves, male    DOB: [birth year], 53 y.o.   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  He still is having problems with the final approval of the Viv's stem system.  The main hang up appears to be still with his insurance company.  He is frustrated as he has been waiting for this for a year plus now.  He does the stretches at home.  He remains on tizanidine  8 mg 4 times daily.  Recent LFTs were within normal limits.  Still has dry mouth but tries to drink plenty of water.  He does report he is losing some weight but attributes that to his pain medications.  He has not discussed with his family doctor.     Pain Inventory Average Pain 3 Pain Right Now 1  My pain is constant, sharp, burning, dull, and aching  LOCATION OF PAIN  lower back  BOWEL Number of stools per week: 2-3 Oral laxative use Yes  Type of laxative Pill  BLADDER Normal   Mobility walk without assistance ability to climb steps?  yes do you drive?  yes Do you have any goals in this area?  yes  Function disabled: date disabled 2018 I need assistance with the following:  Some house hold duties Do you have any goals in this area?  yes  Neuro/Psych weakness spasms anxiety  Prior Studies Any changes since last visit?  no  Physicians involved in your care Any changes since last visit?  no   Family History  Problem Relation Age of Onset   Diabetes Mother    Other Father        unknown medical history   Diabetes Maternal Uncle    Diabetes Maternal Grandmother    Heart disease Maternal Grandmother    Social History   Socioeconomic History   Marital status: Divorced    Spouse name: Not on file   Number of children: 1   Years of education: Not on file   Highest education level: Some college, no degree  Occupational History   Occupation: disability  Tobacco Use   Smoking status: Never   Smokeless tobacco: Never    Tobacco comments:    cbd    Vaping Use   Vaping status: Never Used  Substance and Sexual Activity   Alcohol  use: Not Currently    Comment: last used 10/2020   Drug use: Not Currently    Types: Marijuana    Comment: last used in 2023   Sexual activity: Not Currently    Birth control/protection: Abstinence, None  Other Topics Concern   Not on file  Social History Narrative   Not on file   Social Drivers of Health   Financial Resource Strain: Low Risk  (09/06/2023)   Overall Financial Resource Strain (CARDIA)    Difficulty of Paying Living Expenses: Not hard at all  Food Insecurity: No Food Insecurity (09/06/2023)   Hunger Vital Sign    Worried About Running Out of Food in the Last Year: Never true    Ran Out of Food in the Last Year: Never true  Transportation Needs: No Transportation Needs (09/06/2023)   PRAPARE - Administrator, Civil Service (Medical): No    Lack of Transportation (Non-Medical): No  Physical Activity: Unknown (09/06/2023)   Exercise Vital Sign    Days of Exercise per Week: Patient declined    Minutes of Exercise  per Session: 0 min  Stress: No Stress Concern Present (09/06/2023)   [employer] of Occupational Health - Occupational Stress Questionnaire    Feeling of Stress : Not at all  Social Connections: Socially Isolated (09/06/2023)   Social Connection and Isolation Panel    Frequency of Communication with Friends and Family: More than three times a week    Frequency of Social Gatherings with Friends and Family: Once a week    Attends Religious Services: Never    Database Administrator or Organizations: No    Attends Engineer, Structural: Never    Marital Status: Divorced   Past Surgical History:  Procedure Laterality Date   CYSTECTOMY     head   HERNIA REPAIR Bilateral [phone]   [facility]   SPERMATOCELECTOMY Left 08/05/2022   Procedure: SPERMATOCELECTOMY;  Surgeon: Penne Knee, MD;  Location: [facility] ORS;  Service: Urology;   Laterality: Left;   TOE SURGERY Right 2007   Past Medical History:  Diagnosis Date   Allergy 04/22/2018   Penicillin   Anxiety    Aortic atherosclerosis    Aphasia as late effect of cerebrovascular accident (CVA)    Back pain 04/22/2012   Bone spur    Bulging disc 04/22/2012   Chronic pain syndrome    a.) on buprenorphrine   Cognitive dysfunction    Degenerative disc disease    Erectile dysfunction    GERD (gastroesophageal reflux disease)    Hemorrhoids    History of substance use    a.) THC + cocaine + ETOH   HLD (hyperlipidemia)    HTN (hypertension)    Hydrocele, bilateral    Insomnia    Ischemic cerebrovascular accident (CVA) (HCC) 11/08/2020   a.) MRI brain 11/08/2020 --> extensive hypodensities and loss of gray-white differentiation in the left ACA and left MCA territories with slight hyperdense left MCA and likely hyperdense left distal ICA; etiology felt to be secondary to cocaine use   Lumbar stenosis with neurogenic claudication    Osteoarthritis    Panic anxiety syndrome    Panic disorder    Right inguinal hernia    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    a.) on BZO (diazepam ) PRN   Spermatocele (LEFT)    Taking multiple medications for chronic disease    Ht 6' 1 (1.854 m)   Wt 213 lb (96.6 kg)   BMI 28.10 kg/m   Opioid Risk Score:   Fall Risk Score:  `1  Depression screen [facility] 2/9     02/25/2024    9:15 AM 08/27/2023    9:21 AM 05/07/2023   10:18 AM 04/30/2023   10:06 AM 01/22/2023   10:14 AM 12/17/2022    1:48 PM 09/18/2022    9:15 AM  Depression screen PHQ 2/9  Decreased Interest 0 0 0 0 0 0 0  Down, Depressed, Hopeless 0 0 0 0 0 0 0  PHQ - 2 Score 0 0 0 0 0 0 0    Review of Systems  Musculoskeletal:  Positive for back pain and gait problem.  Neurological:  Positive for weakness.  Psychiatric/Behavioral:         Anxiety  All other systems reviewed and are negative.      Objective:   Physical Exam General: No acute distress HEENT: NCAT, EOMI, oral  membranes moist Cards: reg rate  Chest: normal effort Abdomen: Soft, NT, ND Skin: dry, intact Extremities: no edema Psych: pleasant and appropriate  Skin: intact Neuro: Patient is alert  and oriented x 3.  Demonstrates mild facial droop on the right and dysarthria but speech is clearly intelligible.  Cognitively he demonstrates good insight and awareness and functional memory.  Right upper extremity is 3 out of 5 with shoulder abduction 4 to 4+ out of 5 with elbow flexion, 4 out of 5 elbow extension.  Wrist and hand is 3/3+ out of 5 with more flexor than extensor strength in hand..  Right lower extremity grossly 4 to 4+ out of 5 throughout.  Sensory remains 1 out of 2 right arm and leg.  Tone is trace to 1 throughout RUE.   Deep tendon reflexes are 3+ in the right upper extremity and 2+ right lower extremity.   Gait stable Musculoskeletal: no pain         Assessment & Plan:  Pleasant 53 year old male with history of previous left CVA and July 2022 with residual right sided spastic hemiparesis hemisensory loss.  Right upper extremity is most affected. 2.  Chronic low back pain followed by pain clinic     Plan: COntinue tizanidine  8mg  qid   - LFT's ok -don't see utility in adding another med at this point for his tone  No need for further botox  at this point. His spasticity seems controlled. He's working on [employer] daily Vivistim process still pending. This has been utterly frustrating for him as he's an excellent candidate. He sticks with his home regimen of stretching.      Patient will follow up with me in another 6 months. 20 minutes face to face patient care time were spent during this visit. All questions were encouraged and answered.  SABRA

## 2024-02-25 NOTE — Patient Instructions (Signed)
 ALWAYS FEEL FREE TO CALL OUR OFFICE WITH ANY PROBLEMS OR QUESTIONS [phone])  **PLEASE NOTE** ALL MEDICATION REFILL REQUESTS (INCLUDING CONTROLLED SUBSTANCES) NEED TO BE MADE AT LEAST 7 DAYS PRIOR TO REFILL BEING DUE. ANY REFILL REQUESTS INSIDE THAT TIME FRAME MAY RESULT IN DELAYS IN RECEIVING YOUR PRESCRIPTION.

## 2024-03-30 ENCOUNTER — Other Ambulatory Visit: Payer: Self-pay | Admitting: Family Medicine

## 2024-03-30 DIAGNOSIS — I10 Essential (primary) hypertension: Secondary | ICD-10-CM

## 2024-05-03 ENCOUNTER — Telehealth: Payer: Self-pay

## 2024-05-03 NOTE — Telephone Encounter (Signed)
 Copied from CRM [id number]. Topic: Clinical - Prescription Issue >> May 03, 2024 11:08 AM Emylou G wrote: Reason for CRM: Patient called: said his pain doctor: Dr Deretha in [place] ( he relocated ) - inquiring if Dr Donzella can prescribe: Marinol THC.. Dr Deretha said he will if she doesn't?  Patient told him Dr Donzella wouldn't do it but he asked him to ask us  anyway.. Pls review.SABRA

## 2024-05-12 ENCOUNTER — Ambulatory Visit: Payer: Self-pay

## 2024-05-12 DIAGNOSIS — Z0000 Encounter for general adult medical examination without abnormal findings: Secondary | ICD-10-CM

## 2024-05-12 NOTE — Patient Instructions (Signed)
 Mr. Longest,  Thank you for taking the time for your Medicare Wellness Visit. I appreciate your continued commitment to your health goals. Please review the care plan we discussed, and feel free to reach out if I can assist you further.  Please note that Annual Wellness Visits do not include a physical exam. Some assessments may be limited, especially if the visit was conducted virtually. If needed, we may recommend an in-person follow-up with your provider.  Ongoing Care Seeing your primary care provider every 3 to 6 months helps us  monitor your health and provide consistent, personalized care.   Referrals If a referral was made during today's visit and you haven't received any updates within two weeks, please contact the referred provider directly to check on the status.  Recommended Screenings:  Health Maintenance  Topic Date Due   Pneumococcal Vaccine for age over 77 (1 of 2 - PCV) Never done   Hepatitis B Vaccine (1 of 3 - 19+ 3-dose series) Never done   Cologuard (Stool DNA test)  Never done   DTaP/Tdap/Td vaccine (2 - Td or Tdap) 12/15/2019   Zoster (Shingles) Vaccine (1 of 2) Never done   Flu Shot  11/21/2023   COVID-19 Vaccine (1 - 2025-26 season) Never done   Medicare Annual Wellness Visit  05/06/2024   HPV Vaccine (No Doses Required) Completed   Hepatitis C Screening  Completed   HIV Screening  Completed   Meningitis B Vaccine  Aged Out       05/12/2024   10:18 AM  Advanced Directives  Does Patient Have a Medical Advance Directive? No  Would patient like information on creating a medical advance directive? No - Patient declined    Vision: Annual vision screenings are recommended for early detection of glaucoma, cataracts, and diabetic retinopathy. These exams can also reveal signs of chronic conditions such as diabetes and high blood pressure.  Dental: Annual dental screenings help detect early signs of oral cancer, gum disease, and other conditions linked to  overall health, including heart disease and diabetes.  Please see the attached documents for additional preventive care recommendations.   NEXT AWV 05/17/25 @ 3:50 PM IN PERSON

## 2024-05-12 NOTE — Progress Notes (Signed)
 "  No chief complaint on file.    Subjective:   David Reeves is a 54 y.o. male who presents for a Medicare Annual Wellness Visit.  Visit info / Clinical Intake: Medicare Wellness Visit Type:: Subsequent Annual Wellness Visit Persons participating in visit and providing information:: patient Medicare Wellness Visit Mode:: Video Since this visit was completed virtually, some vitals may be partially provided or unavailable. Missing vitals are due to the limitations of the virtual format.: Unable to obtain vitals - no equipment If Telephone or Video please confirm:: I connected with patient using audio/video enable telemedicine. I verified patient identity with two identifiers, discussed telehealth limitations, and patient agreed to proceed. Patient Location:: home Provider Location:: office Interpreter Needed?: No Pre-visit prep was completed: yes AWV questionnaire completed by patient prior to visit?: no Living arrangements:: with family/others (DAUGHTER & EX-WIFE) Patient's Overall Health Status Rating: good Typical amount of pain: some Does pain affect daily life?: (!) yes Are you currently prescribed opioids?: (!) yes  Dietary Habits and Nutritional Risks How many meals a day?: (!) 1 Eats fruit and vegetables daily?: (!) no Most meals are obtained by: having others provide food In the last 2 weeks, have you had any of the following?: none Diabetic:: no  Functional Status Activities of Daily Living (to include ambulation/medication): Independent Ambulation: Independent Medication Administration: Independent Home Management (perform basic housework or laundry): Independent Manage your own finances?: yes Primary transportation is: driving Concerns about vision?: no *vision screening is required for WTM* ([place]) Concerns about hearing?: no  Fall Screening Falls in the past year?: 0 Number of falls in past year: 0 Was there an injury with Fall?: 0 Fall Risk  Category Calculator: 0 Patient Fall Risk Level: Low Fall Risk  Fall Risk Patient at Risk for Falls Due to: No Fall Risks Fall risk Follow up: Falls evaluation completed; Falls prevention discussed  Home and Transportation Safety: All rugs have non-skid backing?: yes All stairs or steps have railings?: (!) no Grab bars in the bathtub or shower?: yes Have non-skid surface in bathtub or shower?: yes Good home lighting?: yes Regular seat belt use?: yes Hospital stays in the last year:: no  Cognitive Assessment Difficulty concentrating, remembering, or making decisions? : yes Will 6CIT or Mini Cog be Completed: no 6CIT or Mini Cog Declined: patient has a diagnosis of dementia or cognitive impairment  Advance Directives (For Healthcare) Does Patient Have a Medical Advance Directive?: No Would patient like information on creating a medical advance directive?: No - Patient declined  Reviewed/Updated  Reviewed/Updated: Reviewed All (Medical, Surgical, Family, Medications, Allergies, Care Teams, Patient Goals)    Allergies (verified) Duloxetine, Amitriptyline, Buspirone, Ciprofloxacin , Doxepin, Escitalopram, Olanzapine , Penicillins, Sertraline, and Trazodone   Current Medications (verified) Outpatient Encounter Medications as of 05/12/2024  Medication Sig   acetaminophen  (TYLENOL ) 500 MG tablet Take 1,000 mg by mouth every 6 (six) hours as needed for mild pain.   amLODipine  (NORVASC ) 5 MG tablet TAKE 1 TABLET BY MOUTH DAILY   Ascorbic Acid (VITAMIN C PO) Take 1 tablet by mouth daily at 6 (six) AM.   aspirin  EC 81 MG tablet Take 81 mg by mouth daily. Swallow whole.   atorvastatin  (LIPITOR) 80 MG tablet TAKE 1 TABLET BY MOUTH DAILY   buprenorphine (BUTRANS) 15 MCG/HR 1 patch once a week. Thursdays   buprenorphine (BUTRANS) 20 MCG/HR PTWK Place 1 patch onto the skin once a week.   calcium  carbonate (TUMS - DOSED IN MG ELEMENTAL CALCIUM ) 500 MG  chewable tablet Chew 1 tablet by mouth as  needed for indigestion or heartburn.   Calcium  Carbonate Antacid (ALKA-SELTZER ANTACID PO) Take 1 tablet by mouth as needed.   Calcium  Carbonate Antacid 200 MG TBDP Take by mouth.   celecoxib  (CELEBREX ) 100 MG capsule Take 1 capsule (100 mg total) by mouth 2 (two) times daily.   cetirizine  (ZYRTEC ) 10 MG tablet Take 1 tablet (10 mg total) by mouth daily.   Cholecalciferol  (VITAMIN D3) 2000 units TABS Take 1 tablet by mouth daily at 6 (six) AM.   diazepam  (VALIUM ) 10 MG tablet Take 10 mg by mouth 2 (two) times daily.   diclofenac  (VOLTAREN ) 50 MG EC tablet    famotidine  (PEPCID ) 20 MG tablet Take 1 tablet (20 mg total) by mouth daily.   gabapentin  (NEURONTIN ) 400 MG capsule Take 1 capsule (400 mg total) by mouth 3 (three) times daily.   Lactobacillus (REJUVAFLOR) CAPS Take 1 capsule by mouth daily.   lidocaine  (LIDODERM ) 5 % 1 patch daily. Lower back and upper shoulder   Multiple Vitamin (MULTIVITAMIN) tablet Take 1 tablet by mouth daily.   Omega-3 Fatty Acids (FISH OIL PO) Take 1 tablet by mouth daily at 6 (six) AM.   SODIUM FLUORIDE 5000 PPM 1.1 % GEL dental gel PLEASE SEE ATTACHED FOR DETAILED DIRECTIONS   tiZANidine  (ZANAFLEX ) 4 MG tablet TAKE 2 TABLETS BY MOUTH IN THE  MORNING AT NOON IN THE EVENING  AND AT BEDTIME   donepezil  (ARICEPT ) 10 MG tablet Take 1 tablet (10 mg total) by mouth at bedtime. (Patient not taking: Reported on 05/12/2024)   prazosin  (MINIPRESS ) 1 MG capsule Take 2 capsules (2 mg total) by mouth at bedtime. (Patient not taking: Reported on 05/12/2024)   No facility-administered encounter medications on file as of 05/12/2024.    History: Past Medical History:  Diagnosis Date   Allergy 04/22/2018   Penicillin   Anxiety    Aortic atherosclerosis    Aphasia as late effect of cerebrovascular accident (CVA)    Back pain 04/22/2012   Bone spur    Bulging disc 04/22/2012   Chronic pain syndrome    a.) on buprenorphrine   Cognitive dysfunction    Degenerative disc disease     Erectile dysfunction    GERD (gastroesophageal reflux disease)    Hemorrhoids    History of substance use    a.) THC + cocaine + ETOH   HLD (hyperlipidemia)    HTN (hypertension)    Hydrocele, bilateral    Insomnia    Ischemic cerebrovascular accident (CVA) (HCC) 11/08/2020   a.) MRI brain 11/08/2020 --> extensive hypodensities and loss of gray-white differentiation in the left ACA and left MCA territories with slight hyperdense left MCA and likely hyperdense left distal ICA; etiology felt to be secondary to cocaine use   Lumbar stenosis with neurogenic claudication    Osteoarthritis    Panic anxiety syndrome    Panic disorder    Right inguinal hernia    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    a.) on BZO (diazepam ) PRN   Spermatocele (LEFT)    Taking multiple medications for chronic disease    Past Surgical History:  Procedure Laterality Date   CYSTECTOMY     head   HERNIA REPAIR Bilateral [phone]   [facility]   SPERMATOCELECTOMY Left 08/05/2022   Procedure: SPERMATOCELECTOMY;  Surgeon: Penne Knee, MD;  Location: [facility] ORS;  Service: Urology;  Laterality: Left;   TOE SURGERY Right 2007   Family History  Problem Relation Age of Onset   Diabetes Mother    Other Father        unknown medical history   Diabetes Maternal Uncle    Diabetes Maternal Grandmother    Heart disease Maternal Grandmother    Social History   Occupational History   Occupation: disability  Tobacco Use   Smoking status: Never   Smokeless tobacco: Never   Tobacco comments:    cbd    Vaping Use   Vaping status: Never Used  Substance and Sexual Activity   Alcohol  use: Not Currently    Comment: last used 10/2020   Drug use: Not Currently    Types: Marijuana    Comment: last used in 2023   Sexual activity: Not Currently    Birth control/protection: Abstinence, None   Tobacco Counseling Counseling given: Not Answered Tobacco comments: cbd    SDOH Screenings   Food Insecurity: No Food Insecurity  (05/12/2024)  Housing: Unknown (05/12/2024)  Transportation Needs: No Transportation Needs (05/12/2024)  Utilities: Not At Risk (05/12/2024)  Alcohol  Screen: Low Risk (05/07/2023)  Depression (PHQ2-9): Low Risk (05/12/2024)  Financial Resource Strain: Low Risk (09/06/2023)  Physical Activity: Insufficiently Active (05/12/2024)  Social Connections: Socially Isolated (05/12/2024)  Stress: No Stress Concern Present (05/12/2024)  Tobacco Use: Low Risk (05/12/2024)  Health Literacy: Adequate Health Literacy (05/12/2024)   See flowsheets for full screening details  Depression Screen PHQ 2 & 9 Depression Scale- Over the past 2 weeks, how often have you been bothered by any of the following problems? Little interest or pleasure in doing things: 0 Feeling down, depressed, or hopeless (PHQ Adolescent also includes...irritable): 0 PHQ-2 Total Score: 0 Trouble falling or staying asleep, or sleeping too much: 0 Feeling tired or having little energy: 0 Poor appetite or overeating (PHQ Adolescent also includes...weight loss): 0 Feeling bad about yourself - or that you are a failure or have let yourself or your family down: 0 Trouble concentrating on things, such as reading the newspaper or watching television (PHQ Adolescent also includes...like school work): 0 Moving or speaking so slowly that other people could have noticed. Or the opposite - being so fidgety or restless that you have been moving around a lot more than usual: 0 Thoughts that you would be better off dead, or of hurting yourself in some way: 0 PHQ-9 Total Score: 0 If you checked off any problems, how difficult have these problems made it for you to do your work, take care of things at home, or get along with other people?: Not difficult at all  Depression Treatment Depression Interventions/Treatment : EYV7-0 Score <4 Follow-up Not Indicated     Goals Addressed             This Visit's Progress    DIET - INCREASE WATER INTAKE                Objective:    There were no vitals filed for this visit. There is no height or weight on file to calculate BMI.  Hearing/Vision screen No results found. Immunizations and Health Maintenance Health Maintenance  Topic Date Due   Pneumococcal Vaccine: 50+ Years (1 of 2 - PCV) Never done   Hepatitis B Vaccines 19-59 Average Risk (1 of 3 - 19+ 3-dose series) Never done   Fecal DNA (Cologuard)  Never done   DTaP/Tdap/Td (2 - Td or Tdap) 12/15/2019   Zoster Vaccines- Shingrix (1 of 2) Never done   Influenza Vaccine  11/21/2023   COVID-19  Vaccine (1 - 2025-26 season) Never done   Medicare Annual Wellness (AWV)  05/06/2024   HPV VACCINES (No Doses Required) Completed   Hepatitis C Screening  Completed   HIV Screening  Completed   Meningococcal B Vaccine  Aged Out        Assessment/Plan:  This is a routine wellness examination for Visente.  Patient Care Team: Donzella Lauraine SAILOR, DO as PCP - General (Family Medicine) Fernand Grumbles, MD as Referring Physician (Pain Medicine) Babs Arthea DASEN, MD as Consulting Physician (Physical Medicine and Rehabilitation) [facility])  I have personally reviewed and noted the following in the patients chart:   Medical and social history Use of alcohol , tobacco or illicit drugs  Current medications and supplements including opioid prescriptions. Functional ability and status Nutritional status Physical activity Advanced directives List of other physicians Hospitalizations, surgeries, and ER visits in previous 12 months Vitals Screenings to include cognitive, depression, and falls Referrals and appointments  No orders of the defined types were placed in this encounter.  In addition, I have reviewed and discussed with patient certain preventive protocols, quality metrics, and best practice recommendations. A written personalized care plan for preventive services as well as general preventive health  recommendations were provided to patient.   Jhonnie GORMAN Das, LPN   [id number]/7973   Return in about 1 year (around 05/12/2025).  After Visit Summary: (MyChart) Due to this being a telephonic visit, the after visit summary with patients personalized plan was offered to patient via MyChart   Nurse Notes: DECLINES ALL VACCINES; DECLINES COLOGUARD REFERRAL  "

## 2024-05-16 ENCOUNTER — Other Ambulatory Visit: Payer: Self-pay | Admitting: Family Medicine

## 2024-05-16 DIAGNOSIS — I10 Essential (primary) hypertension: Secondary | ICD-10-CM

## 2024-05-16 DIAGNOSIS — G8929 Other chronic pain: Secondary | ICD-10-CM

## 2024-05-16 DIAGNOSIS — I6930 Unspecified sequelae of cerebral infarction: Secondary | ICD-10-CM

## 2024-05-16 DIAGNOSIS — G894 Chronic pain syndrome: Secondary | ICD-10-CM

## 2024-05-17 NOTE — Telephone Encounter (Signed)
 Requested medications are due for refill today.  yes  Requested medications are on the active medications list.  yes  Last refill. Gabapentin  09/10/2023 #300 1 rf, Amlodipine  03/31/2024 #60 0 rf  Future visit scheduled.   no  Notes to clinic.  Pt last seen 09/10/2023. Pt was instructed to RTC 12/11/2023. Pt already given a courtesy refill of amlodipine .    Requested Prescriptions  Pending Prescriptions Disp Refills   gabapentin  (NEURONTIN ) 400 MG capsule [Pharmacy Med Name: Gabapentin  400 MG Oral Capsule] 300 capsule 2    Sig: TAKE 1 CAPSULE BY MOUTH 3 TIMES  DAILY     Neurology: Anticonvulsants - gabapentin  Passed - 05/17/2024  5:36 PM      Passed - Cr in normal range and within 360 days    Creatinine  Date Value Ref Range Status  11/14/2013 0.85 0.60 - 1.30 mg/dL Final   Creatinine, Ser  Date Value Ref Range Status  09/17/2023 0.78 0.76 - 1.27 mg/dL Final         Passed - Completed PHQ-2 or PHQ-9 in the last 360 days      Passed - Valid encounter within last 12 months    Recent Outpatient Visits           8 months ago Medication refill   [facility] [place], Sarah N, DO               amLODipine  (NORVASC ) 5 MG tablet [Pharmacy Med Name: amLODIPine  Besylate 5 MG Oral Tablet] 60 tablet 5    Sig: TAKE 1 TABLET BY MOUTH DAILY     Cardiovascular: Calcium  Channel Blockers 2 Passed - 05/17/2024  5:36 PM      Passed - Last BP in normal range    BP Readings from Last 1 Encounters:  02/25/24 109/70         Passed - Last Heart Rate in normal range    Pulse Readings from Last 1 Encounters:  02/25/24 70         Passed - Valid encounter within last 6 months    Recent Outpatient Visits           8 months ago Medication refill   [facility], Lauraine SAILOR, DO

## 2024-05-18 NOTE — Telephone Encounter (Signed)
 Patient advised

## 2024-05-21 ENCOUNTER — Other Ambulatory Visit: Payer: Self-pay | Admitting: Physical Medicine & Rehabilitation

## 2024-05-21 DIAGNOSIS — R252 Cramp and spasm: Secondary | ICD-10-CM

## 2024-08-25 ENCOUNTER — Encounter: Admitting: Physical Medicine & Rehabilitation

## 2025-05-17 ENCOUNTER — Ambulatory Visit
